# Patient Record
Sex: Female | Born: 1985
Health system: Southern US, Community
[De-identification: ages and names within clinical notes are randomized; demographics above are authoritative.]

## PROBLEM LIST (undated history)

## (undated) DIAGNOSIS — I471 Supraventricular tachycardia, unspecified: Secondary | ICD-10-CM

## (undated) DIAGNOSIS — Z8742 Personal history of other diseases of the female genital tract: Secondary | ICD-10-CM

## (undated) DIAGNOSIS — IMO0001 Reserved for inherently not codable concepts without codable children: Secondary | ICD-10-CM

## (undated) DIAGNOSIS — J45909 Unspecified asthma, uncomplicated: Secondary | ICD-10-CM

## (undated) DIAGNOSIS — M1990 Unspecified osteoarthritis, unspecified site: Secondary | ICD-10-CM

## (undated) DIAGNOSIS — D259 Leiomyoma of uterus, unspecified: Secondary | ICD-10-CM

## (undated) DIAGNOSIS — R0600 Dyspnea, unspecified: Secondary | ICD-10-CM

## (undated) DIAGNOSIS — T783XXA Angioneurotic edema, initial encounter: Secondary | ICD-10-CM

## (undated) DIAGNOSIS — I10 Essential (primary) hypertension: Secondary | ICD-10-CM

## (undated) DIAGNOSIS — N920 Excessive and frequent menstruation with regular cycle: Secondary | ICD-10-CM

## (undated) DIAGNOSIS — G4730 Sleep apnea, unspecified: Secondary | ICD-10-CM

## (undated) DIAGNOSIS — F419 Anxiety disorder, unspecified: Secondary | ICD-10-CM

## (undated) DIAGNOSIS — I499 Cardiac arrhythmia, unspecified: Secondary | ICD-10-CM

## (undated) DIAGNOSIS — E119 Type 2 diabetes mellitus without complications: Secondary | ICD-10-CM

## (undated) DIAGNOSIS — Z975 Presence of (intrauterine) contraceptive device: Secondary | ICD-10-CM

## (undated) HISTORY — PX: TYMPANOSTOMY TUBE PLACEMENT: SHX32

## (undated) HISTORY — DX: Angioneurotic edema, initial encounter: T78.3XXA

## (undated) HISTORY — DX: Presence of (intrauterine) contraceptive device: Z97.5

## (undated) HISTORY — DX: Personal history of other diseases of the female genital tract: Z87.42

## (undated) HISTORY — DX: Excessive and frequent menstruation with regular cycle: N92.0

## (undated) HISTORY — DX: Unspecified asthma, uncomplicated: J45.909

## (undated) HISTORY — PX: TONSILLECTOMY: SUR1361

---
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
## 2007-05-24 ENCOUNTER — Other Ambulatory Visit: Admission: RE | Admit: 2007-05-24 | Discharge: 2007-05-24 | Payer: Self-pay | Admitting: Obstetrics and Gynecology

## 2007-06-02 ENCOUNTER — Emergency Department (HOSPITAL_COMMUNITY): Admission: EM | Admit: 2007-06-02 | Discharge: 2007-06-02 | Payer: Self-pay | Admitting: Emergency Medicine

## 2007-09-17 ENCOUNTER — Emergency Department (HOSPITAL_COMMUNITY): Admission: EM | Admit: 2007-09-17 | Discharge: 2007-09-17 | Payer: Self-pay | Admitting: Emergency Medicine

## 2010-09-13 ENCOUNTER — Emergency Department (HOSPITAL_COMMUNITY)
Admission: EM | Admit: 2010-09-13 | Discharge: 2010-09-13 | Disposition: A | Discharge status: Home / Self Care | Payer: Self-pay | Attending: Emergency Medicine | Admitting: Emergency Medicine

## 2010-09-13 ENCOUNTER — Emergency Department (HOSPITAL_COMMUNITY): Payer: Self-pay

## 2010-09-13 DIAGNOSIS — W108XXA Fall (on) (from) other stairs and steps, initial encounter: Secondary | ICD-10-CM | POA: Insufficient documentation

## 2010-09-13 DIAGNOSIS — Y92009 Unspecified place in unspecified non-institutional (private) residence as the place of occurrence of the external cause: Secondary | ICD-10-CM | POA: Insufficient documentation

## 2010-09-13 DIAGNOSIS — S93409A Sprain of unspecified ligament of unspecified ankle, initial encounter: Secondary | ICD-10-CM | POA: Insufficient documentation

## 2010-09-13 DIAGNOSIS — M25579 Pain in unspecified ankle and joints of unspecified foot: Secondary | ICD-10-CM | POA: Insufficient documentation

## 2010-09-13 LAB — GLUCOSE, CAPILLARY

## 2010-11-06 ENCOUNTER — Emergency Department (HOSPITAL_COMMUNITY): Payer: Self-pay

## 2010-11-06 ENCOUNTER — Emergency Department (HOSPITAL_COMMUNITY)
Admission: EM | Admit: 2010-11-06 | Discharge: 2010-11-06 | Disposition: A | Discharge status: Home / Self Care | Payer: Self-pay | Attending: Emergency Medicine | Admitting: Emergency Medicine

## 2010-11-06 DIAGNOSIS — R109 Unspecified abdominal pain: Secondary | ICD-10-CM | POA: Insufficient documentation

## 2010-11-06 DIAGNOSIS — D259 Leiomyoma of uterus, unspecified: Secondary | ICD-10-CM | POA: Insufficient documentation

## 2010-11-06 LAB — URINALYSIS, ROUTINE W REFLEX MICROSCOPIC
Glucose, UA: NEGATIVE mg/dL
Protein, ur: NEGATIVE mg/dL
Specific Gravity, Urine: 1.025 (ref 1.005–1.030)

## 2010-11-06 LAB — URINE MICROSCOPIC-ADD ON

## 2010-11-13 ENCOUNTER — Emergency Department (HOSPITAL_COMMUNITY)
Admission: EM | Admit: 2010-11-13 | Discharge: 2010-11-14 | Disposition: A | Discharge status: Home / Self Care | Payer: Self-pay | Attending: Emergency Medicine | Admitting: Emergency Medicine

## 2010-11-13 ENCOUNTER — Encounter: Payer: Self-pay | Admitting: Emergency Medicine

## 2010-11-13 DIAGNOSIS — D259 Leiomyoma of uterus, unspecified: Secondary | ICD-10-CM | POA: Insufficient documentation

## 2010-11-13 DIAGNOSIS — D219 Benign neoplasm of connective and other soft tissue, unspecified: Secondary | ICD-10-CM

## 2010-11-13 DIAGNOSIS — R1084 Generalized abdominal pain: Secondary | ICD-10-CM | POA: Insufficient documentation

## 2010-11-13 HISTORY — DX: Leiomyoma of uterus, unspecified: D25.9

## 2010-11-13 LAB — URINALYSIS, ROUTINE W REFLEX MICROSCOPIC
Bilirubin Urine: NEGATIVE
Glucose, UA: NEGATIVE mg/dL
pH: 7 (ref 5.0–8.0)

## 2010-11-13 LAB — CBC
HCT: 36.3 % (ref 36.0–46.0)
MCV: 84 fL (ref 78.0–100.0)
RBC: 4.32 MIL/uL (ref 3.87–5.11)
WBC: 6.6 10*3/uL (ref 4.0–10.5)

## 2010-11-13 LAB — URINE MICROSCOPIC-ADD ON

## 2010-11-13 LAB — BASIC METABOLIC PANEL
BUN: 12 mg/dL (ref 6–23)
CO2: 25 mEq/L (ref 19–32)
Chloride: 106 mEq/L (ref 96–112)
Creatinine, Ser: 0.53 mg/dL (ref 0.50–1.10)
Glucose, Bld: 111 mg/dL — ABNORMAL HIGH (ref 70–99)

## 2010-11-13 MED ORDER — ONDANSETRON HCL 4 MG/2ML IJ SOLN: 4.0000 mg | Freq: Once | INTRAMUSCULAR | Status: DC

## 2010-11-13 MED ORDER — HYDROMORPHONE HCL 1 MG/ML IJ SOLN
1.0000 mg | Freq: Once | INTRAMUSCULAR | Status: AC
  Administered 2010-11-13: 1 mg via INTRAMUSCULAR
  Filled 2010-11-13 (×2): qty 1

## 2010-11-13 NOTE — ED Provider Notes (Cosign Needed)
History     Chief Complaint  Patient presents with  . Abdominal Pain   HPI Comments: She was dxd with fibroids 1 week ago.  She was seen by an ob/gyn and started on meds to reduce the size of the fibroids. She has not gotten relief so she came to the ed.  Patient is a 25 y.o. female presenting with abdominal pain. The history is provided by the patient.  Abdominal Pain The primary symptoms of the illness include abdominal pain. The primary symptoms of the illness do not include fever, shortness of breath, nausea, vomiting, diarrhea, hematemesis, dysuria, vaginal discharge or vaginal bleeding. Episode onset: one week ago. The onset of the illness was gradual. The problem has been gradually worsening.  The patient states that she believes she is currently not pregnant. Additional symptoms associated with the illness include urgency. Symptoms associated with the illness do not include chills, anorexia, heartburn, constipation, hematuria, frequency or back pain. Significant associated medical issues do not include PUD, GERD, inflammatory bowel disease, diabetes, gallstones, liver disease or diverticulitis.    Past Medical History  Diagnosis Date  . Fibroid, uterine     History reviewed. No pertinent past surgical history.  History reviewed. No pertinent family history.  History  Substance Use Topics  . Smoking status: Never Smoker   . Smokeless tobacco: Not on file  . Alcohol Use: No    OB History    Grav Para Term Preterm Abortions TAB SAB Ect Mult Living                  Review of Systems  Constitutional: Negative for fever, chills and appetite change.  HENT: Negative for facial swelling.   Eyes: Negative for redness.  Respiratory: Negative for cough, chest tightness and shortness of breath.   Cardiovascular: Negative for chest pain.  Gastrointestinal: Positive for abdominal pain. Negative for heartburn, nausea, vomiting, diarrhea, constipation, abdominal distention, anorexia  and hematemesis.  Genitourinary: Positive for urgency. Negative for dysuria, frequency, hematuria, vaginal bleeding, vaginal discharge and difficulty urinating.  Musculoskeletal: Negative for back pain.  Skin: Negative for color change.  Neurological: Negative for dizziness and headaches.  Psychiatric/Behavioral: Negative for confusion.    Physical Exam  BP 113/67  Pulse 78  Temp(Src) 98.2 F (36.8 C) (Oral)  Resp 20  Ht 5\' 5"  (1.651 m)  Wt 212 lb (96.163 kg)  BMI 35.28 kg/m2  SpO2 100%  LMP 10/22/2010  Physical Exam  Constitutional: She is oriented to person, place, and time. She appears well-developed and well-nourished. No distress.  HENT:  Head: Normocephalic and atraumatic.  Eyes: Pupils are equal, round, and reactive to light.  Neck: Normal range of motion. Neck supple.  Cardiovascular: Normal heart sounds.   Pulmonary/Chest: Effort normal.  Abdominal: Soft. She exhibits no distension. There is tenderness in the left lower quadrant. There is no rigidity, no rebound, no guarding and no tenderness at McBurney's point.       Firm mass with mild ttp in llq  Musculoskeletal: Normal range of motion.  Neurological: She is alert and oriented to person, place, and time.  Skin: Skin is warm and dry. She is not diaphoretic.  Psychiatric: She has a normal mood and affect.    ED Course  Procedures  MDM   Abdominal pain with known fibroids. Taking med to reduce size of fibroids. Not on analgesics previously for pain.  Pain controled after tx in ed.    No acute abdomen. Not pregnant. No uti.  Not anemic or toxic.        Nicholes Stairs, MD 11/14/10 6962  Nicholes Stairs, MD 11/14/10 0111

## 2010-11-13 NOTE — ED Notes (Signed)
Patient was seen here 1 week, diagnosed with fibroid tumors; states saw GYN MD on Tuesday and was given medication to shrink tumors. States pain has gotten worse.

## 2010-11-14 MED ORDER — OXYCODONE-ACETAMINOPHEN 5-325 MG PO TABS: 2.0000 | ORAL_TABLET | ORAL | Status: DC | PRN

## 2010-11-24 ENCOUNTER — Other Ambulatory Visit: Payer: Self-pay | Admitting: Obstetrics & Gynecology

## 2010-11-25 ENCOUNTER — Encounter (HOSPITAL_COMMUNITY): Payer: Self-pay

## 2010-11-25 ENCOUNTER — Encounter (HOSPITAL_COMMUNITY)
Admission: RE | Admit: 2010-11-25 | Discharge: 2010-11-25 | Disposition: A | Discharge status: Home / Self Care | Payer: Self-pay | Source: Ambulatory Visit | Attending: Obstetrics & Gynecology | Admitting: Obstetrics & Gynecology

## 2010-11-25 DIAGNOSIS — Z01812 Encounter for preprocedural laboratory examination: Secondary | ICD-10-CM | POA: Insufficient documentation

## 2010-11-25 DIAGNOSIS — Z5309 Procedure and treatment not carried out because of other contraindication: Secondary | ICD-10-CM | POA: Insufficient documentation

## 2010-11-25 DIAGNOSIS — D259 Leiomyoma of uterus, unspecified: Secondary | ICD-10-CM | POA: Insufficient documentation

## 2010-11-25 LAB — COMPREHENSIVE METABOLIC PANEL
Albumin: 3.6 g/dL (ref 3.5–5.2)
BUN: 8 mg/dL (ref 6–23)
Calcium: 9.3 mg/dL (ref 8.4–10.5)
Creatinine, Ser: 0.56 mg/dL (ref 0.50–1.10)
Potassium: 3.6 mEq/L (ref 3.5–5.1)
Total Protein: 6.9 g/dL (ref 6.0–8.3)

## 2010-11-25 LAB — HCG, QUANTITATIVE, PREGNANCY: hCG, Beta Chain, Quant, S: 1530 m[IU]/mL — ABNORMAL HIGH (ref <5)

## 2010-11-25 LAB — SURGICAL PCR SCREEN: Staphylococcus aureus: POSITIVE — AB

## 2010-11-25 LAB — URINALYSIS, ROUTINE W REFLEX MICROSCOPIC
Glucose, UA: NEGATIVE mg/dL
Leukocytes, UA: NEGATIVE
Specific Gravity, Urine: >1.03 — ABNORMAL HIGH (ref 1.005–1.030)
pH: 5.5 (ref 5.0–8.0)

## 2010-11-25 LAB — CBC
HCT: 34.9 % — ABNORMAL LOW (ref 36.0–46.0)
MCHC: 33 g/dL (ref 30.0–36.0)
MCV: 84.5 fL (ref 78.0–100.0)
RDW: 14.5 % (ref 11.5–15.5)

## 2010-11-25 MED ORDER — MUPIROCIN 2 % EX OINT
TOPICAL_OINTMENT | CUTANEOUS | Status: AC
  Filled 2010-11-25: qty 22

## 2010-11-25 NOTE — Patient Instructions (Addendum)
20 Jessica Sanchez  11/25/2010   Your procedure is scheduled on:  12/02/2010  Report to [facility] at  615  AM.  Call this number if you have problems the morning of surgery: [phone]   Remember:   Do not eat food:After Midnight.  Do not drink clear liquids: After Midnight.  Take these medicines the morning of surgery with A SIP OF WATER: none   Do not wear jewelry, make-up or nail polish.  Do not bring valuables to the hospital.  Contacts, dentures or bridgework may not be worn into surgery.  Leave suitcase in the car. After surgery it may be brought to your room.  For patients admitted to the hospital, checkout time is 11:00 AM the day of discharge.   Patients discharged the day of surgery will not be allowed to drive home.  Name and phone number of your driver: family  Special Instructions: CHG Shower Shower 2 days before surgery and 1 day before surgery with Hibiclens.   Please read over the following fact sheets that you were given: Pain Booklet, MRSA Information, Surgical Site Infection Prevention and Anesthesia Post-op Instructions PATIENT INSTRUCTIONS POST-ANESTHESIA  IMMEDIATELY FOLLOWING SURGERY:  Do not drive or operate machinery for the first twenty four hours after surgery.  Do not make any important decisions for twenty four hours after surgery or while taking narcotic pain medications or sedatives.  If you develop intractable nausea and vomiting or a severe headache please notify your doctor immediately.  FOLLOW-UP:  Please make an appointment with your surgeon as instructed. You do not need to follow up with anesthesia unless specifically instructed to do so.  WOUND CARE INSTRUCTIONS (if applicable):  Keep a dry clean dressing on the anesthesia/puncture wound site if there is drainage.  Once the wound has quit draining you may leave it open to air.  Generally you should leave the bandage intact for twenty four hours unless there is drainage.  If the epidural site drains for  more than 36-48 hours please call the anesthesia department.  QUESTIONS?:  Please feel free to call your physician or the hospital operator if you have any questions, and they will be happy to assist you.     [facility] Anesthesia Department [address] [place] [phone]

## 2010-11-26 NOTE — Pre-Procedure Instructions (Signed)
Beta hcg 1530.Called to Dr Despina Hidden.Surgery cancelled.He will call patient.[name] or scheduler called and notified.

## 2010-11-28 LAB — TYPE AND SCREEN: ABO/RH(D): A POS

## 2010-12-02 ENCOUNTER — Encounter (HOSPITAL_COMMUNITY): Admission: RE | Payer: Self-pay | Source: Ambulatory Visit

## 2010-12-02 ENCOUNTER — Ambulatory Visit (HOSPITAL_COMMUNITY): Admission: RE | Admit: 2010-12-02 | Payer: Self-pay | Source: Ambulatory Visit | Admitting: Obstetrics & Gynecology

## 2010-12-02 SURGERY — MYOMECTOMY, ABDOMINAL APPROACH
Anesthesia: General

## 2010-12-05 ENCOUNTER — Emergency Department (HOSPITAL_COMMUNITY)
Admission: EM | Admit: 2010-12-05 | Discharge: 2010-12-05 | Disposition: A | Discharge status: Home / Self Care | Payer: Medicaid Other | Attending: Emergency Medicine | Admitting: Emergency Medicine

## 2010-12-05 ENCOUNTER — Encounter (HOSPITAL_COMMUNITY): Payer: Self-pay

## 2010-12-05 DIAGNOSIS — D259 Leiomyoma of uterus, unspecified: Secondary | ICD-10-CM | POA: Insufficient documentation

## 2010-12-05 DIAGNOSIS — O4690 Antepartum hemorrhage, unspecified, unspecified trimester: Secondary | ICD-10-CM

## 2010-12-05 DIAGNOSIS — O209 Hemorrhage in early pregnancy, unspecified: Secondary | ICD-10-CM | POA: Insufficient documentation

## 2010-12-05 HISTORY — DX: Reserved for inherently not codable concepts without codable children: IMO0001

## 2010-12-05 MED ORDER — ACETAMINOPHEN 325 MG PO TABS
650.0000 mg | ORAL_TABLET | Freq: Once | ORAL | Status: AC
  Administered 2010-12-05: 650 mg via ORAL
  Filled 2010-12-05: qty 1

## 2010-12-05 NOTE — ED Notes (Signed)
In and out cath preformed.  No bleeding noted,.

## 2010-12-05 NOTE — ED Notes (Signed)
Known fibroids, sched for surgery on 8/1. Began spotting and had an office sono done which revealed 6week gest pregnancy.  Now bleeding one pad/hour, no clotting

## 2010-12-06 ENCOUNTER — Emergency Department (HOSPITAL_COMMUNITY)
Admission: EM | Admit: 2010-12-06 | Discharge: 2010-12-07 | Disposition: A | Discharge status: Home / Self Care | Payer: Medicaid Other | Attending: Emergency Medicine | Admitting: Emergency Medicine

## 2010-12-06 ENCOUNTER — Encounter (HOSPITAL_COMMUNITY): Payer: Self-pay | Admitting: *Deleted

## 2010-12-06 DIAGNOSIS — R55 Syncope and collapse: Secondary | ICD-10-CM

## 2010-12-06 DIAGNOSIS — O99891 Other specified diseases and conditions complicating pregnancy: Secondary | ICD-10-CM | POA: Insufficient documentation

## 2010-12-06 NOTE — ED Notes (Signed)
Pt arrived pov, AMS initially in vehicle, pt moved to stretcher and transported to room 1, pt reports she is approx 6 weeks preg based on due date, attempted FHT without success

## 2010-12-06 NOTE — ED Notes (Signed)
Patient remembers telling a co-worker that she was light headed and that was the last thing she remembers, reported that pt passed out, pt states that she has eaten today (a sub), pt alert and oriented at this time

## 2010-12-07 ENCOUNTER — Other Ambulatory Visit: Payer: Self-pay

## 2010-12-07 MED ORDER — SODIUM CHLORIDE 0.9 % IV BOLUS (SEPSIS)
1000.0000 mL | Freq: Once | INTRAVENOUS | Status: AC
  Administered 2010-12-07: 1000 mL via INTRAVENOUS

## 2010-12-07 MED ORDER — SODIUM CHLORIDE 0.9 % IV SOLN: Freq: Once | INTRAVENOUS | Status: DC

## 2010-12-07 NOTE — ED Provider Notes (Addendum)
History     CSN: [id number] Arrival date & time: 12/06/2010 11:33 PM  Chief Complaint  Patient presents with  . Loss of Consciousness   HPI Comments: Seen 1158  Patient is [redacted] weeks pregnant. LMP 10/22/2010. Under the care of OB Dr. Despina Hidden. G1PoAo  Patient is a 25 y.o. female presenting with syncope. The history is provided by the patient.  Loss of Consciousness Chronicity: Patient was leaving work and felt dizzy, had syncopal episode while going to the car. Friend caught her as she was faiting and lowered her to the ground. She woke immediattely. Brought to the hospital. The current episode started less than 1 hour ago. The problem has been resolved. Pertinent negatives include no chest pain, no abdominal pain, no headaches and no shortness of breath. Associated symptoms comments: Felt a little dizzy. The symptoms are aggravated by nothing. The symptoms are relieved by nothing. She has tried nothing for the symptoms.    Past Medical History  Diagnosis Date  . Fibroid, uterine   . Fibroid (bleeding) (uterine)     Past Surgical History  Procedure Date  . Tonsillectomy     Family History  Problem Relation Age of Onset  . Anesthesia problems Neg Hx   . Hypotension Neg Hx   . Malignant hyperthermia Neg Hx   . Pseudochol deficiency Neg Hx     History  Substance Use Topics  . Smoking status: Never Smoker   . Smokeless tobacco: Not on file  . Alcohol Use: No    OB History    Grav Para Term Preterm Abortions TAB SAB Ect Mult Living   1               Review of Systems  Respiratory: Negative for shortness of breath.   Cardiovascular: Positive for syncope. Negative for chest pain.  Gastrointestinal: Negative for abdominal pain.  Neurological: Negative for headaches.  All other systems reviewed and are negative.    Physical Exam  LMP 10/23/2010  Physical Exam  Nursing note and vitals reviewed. Constitutional: She is oriented to person, place, and time. She appears  well-developed and well-nourished.  HENT:  Head: Atraumatic.  Right Ear: External ear normal.  Left Ear: External ear normal.  Mouth/Throat: Oropharynx is clear and moist.  Eyes: EOM are normal. Pupils are equal, round, and reactive to light.  Neck: Normal range of motion. Neck supple.  Cardiovascular: Normal rate, normal heart sounds and intact distal pulses.   Pulmonary/Chest: Effort normal and breath sounds normal.  Abdominal: Soft. Bowel sounds are normal.  Musculoskeletal: Normal range of motion.  Neurological: She is alert and oriented to person, place, and time.  Skin: Skin is warm and dry.    ED Course  Procedures  MDM  Patient had orthostatics done which were nomal.She has had IVF and PO fluids. She has had no dizziness. Ambulated in the department and has gone to the bathroom. No symptoms.   Nicoletta Dress. Colon Branch, MD 12/07/10 9629  Date: 12/07/2010  5284  [id number]  Rhythm: normal sinus rhythm  QRS Axis: normal  Intervals: normal  ST/T Wave abnormalities: normal  Conduction Disutrbances:none  Narrative Interpretation: T wave abnormality with inversion in anterior leads.   Old EKG Reviewed: none available   Nicoletta Dress. Colon Branch, MD 12/07/10 [phone]

## 2010-12-09 NOTE — ED Provider Notes (Signed)
History     CSN: [id number] Arrival date & time: 12/05/2010  2:04 AM  Chief Complaint  Patient presents with  . Vaginal Bleeding   HPI  Past Medical History  Diagnosis Date  . Fibroid, uterine   . Fibroid (bleeding) (uterine)     Past Surgical History  Procedure Date  . Tonsillectomy     Family History  Problem Relation Age of Onset  . Anesthesia problems Neg Hx   . Hypotension Neg Hx   . Malignant hyperthermia Neg Hx   . Pseudochol deficiency Neg Hx     History  Substance Use Topics  . Smoking status: Never Smoker   . Smokeless tobacco: Not on file  . Alcohol Use: No    OB History    Grav Para Term Preterm Abortions TAB SAB Ect Mult Living   1               Review of Systems  Physical Exam  BP 123/71  Pulse 81  Temp(Src) 98.6 F (37 C) (Oral)  Resp 16  Ht 5\' 4"  (1.626 m)  Wt 215 lb (97.523 kg)  BMI 36.90 kg/m2  SpO2 100%  LMP 10/23/2010  Physical Exam  ED Course  Procedures  MDM       Aurther Loft S. Colon Branch, MD 12/09/10 1159

## 2010-12-10 LAB — GLUCOSE, CAPILLARY: Glucose-Capillary: 93 mg/dL (ref 70–99)

## 2010-12-11 ENCOUNTER — Emergency Department (HOSPITAL_COMMUNITY)
Admission: EM | Admit: 2010-12-11 | Discharge: 2010-12-11 | Disposition: A | Discharge status: Home / Self Care | Payer: Medicaid Other | Attending: Emergency Medicine | Admitting: Emergency Medicine

## 2010-12-11 ENCOUNTER — Encounter (HOSPITAL_COMMUNITY): Payer: Self-pay | Admitting: Emergency Medicine

## 2010-12-11 DIAGNOSIS — O200 Threatened abortion: Secondary | ICD-10-CM

## 2010-12-11 LAB — HEMOGLOBIN AND HEMATOCRIT, BLOOD: HCT: 35.8 % — ABNORMAL LOW (ref 36.0–46.0)

## 2010-12-11 NOTE — ED Notes (Signed)
Patient states she is pregnant and has gone through 10 pads today and is now passing clots.

## 2010-12-12 ENCOUNTER — Encounter (HOSPITAL_COMMUNITY): Payer: Self-pay | Admitting: *Deleted

## 2010-12-19 NOTE — ED Provider Notes (Signed)
History     CSN: [id number] Arrival date & time: 12/11/2010  7:01 PM  Chief Complaint  Patient presents with  . Vaginal Bleeding   HPI Comments: Patient presents with vaginal bleeding with a known 6 week pregnancy. She was seen by Dr. Despina Hidden 7days ago at which time a intrauterine pregnancy was established.  She started spotting over the weekend and returned to his office 4 days ago at which time a beta hcg was completed,  The number being around 7800 per grandmothers report.  This test was repeated yesterday and had not changed,  At which time she was told she was probably having a spontaneous miscarriage.  She presents today for increased bleeding and mild pelvic cramping.  She passed a piece of "tissue" today as well.  She denies weakness,  Dizziness,  Shortness of breath.  Patient is a 25 y.o. female presenting with vaginal bleeding. The history is provided by the patient (grandmother).  Vaginal Bleeding This is a new problem. The current episode started yesterday. The problem has been gradually worsening. Pertinent negatives include no abdominal pain, arthralgias, chest pain, congestion, fatigue, fever, headaches, joint swelling, nausea, neck pain, numbness, rash, sore throat or weakness. Associated symptoms comments: Describes occasional low pelvic cramping.. The symptoms are aggravated by nothing. She has tried nothing for the symptoms.    Past Medical History  Diagnosis Date  . Fibroid, uterine   . Fibroid (bleeding) (uterine)     Past Surgical History  Procedure Date  . Tonsillectomy     Family History  Problem Relation Age of Onset  . Anesthesia problems Neg Hx   . Hypotension Neg Hx   . Malignant hyperthermia Neg Hx   . Pseudochol deficiency Neg Hx     History  Substance Use Topics  . Smoking status: Never Smoker   . Smokeless tobacco: Not on file  . Alcohol Use: No    OB History    Grav Para Term Preterm Abortions TAB SAB Ect Mult Living   1                Review of Systems  Constitutional: Negative for fever and fatigue.  HENT: Negative for congestion, sore throat and neck pain.   Eyes: Negative.   Respiratory: Negative for chest tightness and shortness of breath.   Cardiovascular: Negative for chest pain.  Gastrointestinal: Negative for nausea and abdominal pain.  Genitourinary: Positive for vaginal bleeding. Negative for dysuria and vaginal discharge.  Musculoskeletal: Negative for joint swelling and arthralgias.  Skin: Negative.  Negative for rash and wound.  Neurological: Negative for dizziness, weakness, light-headedness, numbness and headaches.  Hematological: Negative.   Psychiatric/Behavioral: Negative.     Physical Exam  BP 125/80  Pulse 94  Temp(Src) 98.2 F (36.8 C) (Oral)  Resp 20  Ht 5\' 5"  (1.651 m)  Wt 210 lb (95.255 kg)  BMI 34.95 kg/m2  SpO2 100%  LMP 10/23/2010  Physical Exam  Nursing note and vitals reviewed. Constitutional: She is oriented to person, place, and time. She appears well-developed and well-nourished.  HENT:  Head: Normocephalic and atraumatic.  Eyes: Conjunctivae are normal.  Neck: Normal range of motion.  Cardiovascular: Normal rate, regular rhythm, normal heart sounds and intact distal pulses.   Pulmonary/Chest: Effort normal and breath sounds normal. She has no wheezes.  Abdominal: Soft. Bowel sounds are normal. She exhibits no mass. There is no tenderness.  Musculoskeletal: Normal range of motion.  Neurological: She is alert and oriented to person, place, and  time.  Skin: Skin is warm and dry.  Psychiatric: She has a normal mood and affect.    ED Course  Procedures  MDM Discussed care with Dr. Oletta Lamas,  Patient stable and in no discomfort at this time.  Incomplete abortion.  Patient understands need to return or call Dr. Despina Hidden for weakness,  Dizziness,  Increased bleeding.  Hgb stable today.      Candis Musa, PA 12/19/10 [phone]

## 2010-12-19 NOTE — ED Provider Notes (Signed)
Evaluation and management procedures were performed by the mid-level provider (PA/NP/CNM) under my supervision/collaboration. I was present and available during the ED course. Ameenah Prosser Y.   Gavin Pound. Bettejane Leavens, MD 12/19/10 1459

## 2011-01-21 LAB — URINALYSIS, ROUTINE W REFLEX MICROSCOPIC
Nitrite: NEGATIVE
Protein, ur: NEGATIVE
Specific Gravity, Urine: >1.03 — ABNORMAL HIGH
Urobilinogen, UA: 0.2

## 2011-01-21 LAB — URINE CULTURE

## 2011-01-21 LAB — PREGNANCY, URINE: Preg Test, Ur: NEGATIVE

## 2011-01-21 LAB — URINE MICROSCOPIC-ADD ON

## 2011-01-26 ENCOUNTER — Other Ambulatory Visit: Payer: Self-pay | Admitting: Obstetrics & Gynecology

## 2011-01-27 ENCOUNTER — Encounter (HOSPITAL_COMMUNITY)
Admission: RE | Admit: 2011-01-27 | Discharge: 2011-01-27 | Disposition: A | Discharge status: Home / Self Care | Payer: Medicaid Other | Source: Ambulatory Visit | Attending: Obstetrics & Gynecology | Admitting: Obstetrics & Gynecology

## 2011-01-27 ENCOUNTER — Encounter (HOSPITAL_COMMUNITY): Payer: Self-pay

## 2011-01-27 ENCOUNTER — Other Ambulatory Visit: Payer: Self-pay | Admitting: Obstetrics & Gynecology

## 2011-01-27 LAB — URINE MICROSCOPIC-ADD ON

## 2011-01-27 LAB — CBC
HCT: 39.3 % (ref 36.0–46.0)
Hemoglobin: 12.9 g/dL (ref 12.0–15.0)
MCHC: 32.8 g/dL (ref 30.0–36.0)
MCV: 83.6 fL (ref 78.0–100.0)
RDW: 13.8 % (ref 11.5–15.5)
WBC: 4.2 10*3/uL (ref 4.0–10.5)

## 2011-01-27 LAB — URINALYSIS, ROUTINE W REFLEX MICROSCOPIC
Bilirubin Urine: NEGATIVE
Glucose, UA: NEGATIVE mg/dL
Ketones, ur: NEGATIVE mg/dL
pH: 5.5 (ref 5.0–8.0)

## 2011-01-27 LAB — COMPREHENSIVE METABOLIC PANEL
Albumin: 3.9 g/dL (ref 3.5–5.2)
Alkaline Phosphatase: 58 U/L (ref 39–117)
BUN: 8 mg/dL (ref 6–23)
Chloride: 105 mEq/L (ref 96–112)
Creatinine, Ser: 0.72 mg/dL (ref 0.50–1.10)
GFR calc Af Amer: >60 mL/min (ref >60)
Glucose, Bld: 93 mg/dL (ref 70–99)
Potassium: 4 mEq/L (ref 3.5–5.1)
Total Bilirubin: 0.5 mg/dL (ref 0.3–1.2)

## 2011-01-27 LAB — HCG, QUANTITATIVE, PREGNANCY: hCG, Beta Chain, Quant, S: 1 m[IU]/mL (ref <5)

## 2011-01-27 MED ORDER — CEFAZOLIN SODIUM 1-5 GM-% IV SOLN
1.0000 g | INTRAVENOUS | Status: DC
Start: 2011-01-27 — End: 2011-01-27

## 2011-01-27 MED ORDER — LACTATED RINGERS IV SOLN: INTRAVENOUS | Status: DC

## 2011-01-27 NOTE — Patient Instructions (Addendum)
20 Jessica Sanchez  01/27/2011   Your procedure is scheduled on:   02/03/2011  Report to [facility] at  615  AM.  Call this number if you have problems the morning of surgery: [phone]   Remember:   Do not eat food:After Midnight.  Do not drink clear liquids: After Midnight.  Take these medicines the morning of surgery with A SIP OF WATER: percocet   Do not wear jewelry, make-up or nail polish.  Do not wear lotions, powders, or perfumes. You may wear deodorant.  Do not shave 48 hours prior to surgery.  Do not bring valuables to the hospital.  Contacts, dentures or bridgework may not be worn into surgery.  Leave suitcase in the car. After surgery it may be brought to your room.  For patients admitted to the hospital, checkout time is 11:00 AM the day of discharge.   Patients discharged the day of surgery will not be allowed to drive home.  Name and phone number of your driver: family  Special Instructions: CHG Shower Use Special Wash: 1/2 bottle night before surgery and 1/2 bottle morning of surgery.   Please read over the following fact sheets that you were given: Pain Booklet, MRSA Information, Surgical Site Infection Prevention, Anesthesia Post-op Instructions and Care and Recovery After Surgery PATIENT INSTRUCTIONS POST-ANESTHESIA  IMMEDIATELY FOLLOWING SURGERY:  Do not drive or operate machinery for the first twenty four hours after surgery.  Do not make any important decisions for twenty four hours after surgery or while taking narcotic pain medications or sedatives.  If you develop intractable nausea and vomiting or a severe headache please notify your doctor immediately.  FOLLOW-UP:  Please make an appointment with your surgeon as instructed. You do not need to follow up with anesthesia unless specifically instructed to do so.  WOUND CARE INSTRUCTIONS (if applicable):  Keep a dry clean dressing on the anesthesia/puncture wound site if there is drainage.  Once the wound has quit  draining you may leave it open to air.  Generally you should leave the bandage intact for twenty four hours unless there is drainage.  If the epidural site drains for more than 36-48 hours please call the anesthesia department.  QUESTIONS?:  Please feel free to call your physician or the hospital operator if you have any questions, and they will be happy to assist you.     [facility] Anesthesia Department [address] [place] [phone]

## 2011-02-02 NOTE — H&P (Signed)
Jessica Sanchez is an 25 y.o. female. G1 P0 A1 on chronic megestrol therapy with enlarged fibroid uterus, multiple fibroids which are symptomatic, with abdominal pain, dysmenorrhea, and menometrorrhagia.  She was scheduled to have the surgery a couple of months ago but was pregnant, unknowlingly.  She eventually suffered a pregnancy loss in the first trimester.  She is admitted for abdominal myomectomy.   Past Medical History  Diagnosis Date  . Fibroid, uterine   . Fibroid (bleeding) (uterine)     Past Surgical History  Procedure Date  . Tonsillectomy     Family History  Problem Relation Age of Onset  . Anesthesia problems Neg Hx   . Hypotension Neg Hx   . Malignant hyperthermia Neg Hx   . Pseudochol deficiency Neg Hx     Social History:  reports that she has never smoked. She does not have any smokeless tobacco history on file. She reports that she does not drink alcohol or use illicit drugs.  Allergies: No Known Allergies    Review of Systems  Constitutional: Negative for fever, chills, weight loss, malaise/fatigue and diaphoresis.  HENT: Negative for hearing loss, ear pain, nosebleeds, congestion, sore throat, neck pain, tinnitus and ear discharge.   Eyes: Negative for blurred vision, double vision, photophobia, pain, discharge and redness.  Respiratory: Negative for cough, hemoptysis, sputum production, shortness of breath, wheezing and stridor.   Cardiovascular: Negative for chest pain, palpitations, orthopnea, claudication, leg swelling and PND.  Gastrointestinal: Positive for abdominal pain. Negative for heartburn, nausea, vomiting, diarrhea, constipation, blood in stool and melena.  Genitourinary: Negative for dysuria, urgency, frequency, hematuria and flank pain.  Musculoskeletal: Negative for myalgias, back pain, joint pain and falls.  Skin: Negative for itching and rash.  Neurological: Negative for dizziness, tingling, tremors, sensory change, speech change, focal  weakness, seizures, loss of consciousness, weakness and headaches.  Endo/Heme/Allergies: Negative for environmental allergies and polydipsia. Does not bruise/bleed easily.  Psychiatric/Behavioral: Negative for depression, suicidal ideas, hallucinations, memory loss and substance abuse. The patient is not nervous/anxious and does not have insomnia.     Last menstrual period 10/23/2010, unknown if currently breastfeeding. Physical Exam  Vitals reviewed. Constitutional: She is oriented to person, place, and time. She appears well-developed and well-nourished.  HENT:  Head: Normocephalic and atraumatic.  Right Ear: External ear normal.  Left Ear: External ear normal.  Nose: Nose normal.  Mouth/Throat: Oropharynx is clear and moist.  Eyes: Conjunctivae and EOM are normal. Pupils are equal, round, and reactive to light. Right eye exhibits no discharge. Left eye exhibits no discharge. No scleral icterus.  Neck: Normal range of motion. Neck supple. No tracheal deviation present. No thyromegaly present.  Cardiovascular: Normal rate, regular rhythm, normal heart sounds and intact distal pulses.  Exam reveals no gallop and no friction rub.   No murmur heard. Respiratory: Effort normal and breath sounds normal. No respiratory distress. She has no wheezes. She has no rales. She exhibits no tenderness.  GI: Soft. Bowel sounds are normal. She exhibits no distension and no mass. There is tenderness. There is no rebound and no guarding.  Genitourinary:       Vulva is normal without lesions Vagina is pink moist without discharge Cervix normal in appearance and pap is normal Uterus is enlarged to 16-18 weeks size due to multiple fibroids the largest of which is 6.1 cm Adnexa is negative with normal sized ovaries by sonogram  Musculoskeletal: Normal range of motion. She exhibits no edema and no tenderness.  Neurological:  She is alert and oriented to person, place, and time. She has normal reflexes. She  displays normal reflexes. No cranial nerve deficit. She exhibits normal muscle tone. Coordination normal.  Skin: Skin is warm and dry. No rash noted. No erythema. No pallor.  Psychiatric: She has a normal mood and affect. Her behavior is normal. Judgment and thought content normal.    Recent Results (from the past 336 hour(s))  URINALYSIS, ROUTINE W REFLEX MICROSCOPIC   Collection Time   01/27/11  8:58 AM      Component Value Range   Color, Urine YELLOW  YELLOW    Appearance CLEAR  CLEAR    Specific Gravity, Urine >1.030 (*) 1.005 - 1.030    pH 5.5  5.0 - 8.0    Glucose, UA NEGATIVE  NEGATIVE (mg/dL)   Hgb urine dipstick LARGE (*) NEGATIVE    Bilirubin Urine NEGATIVE  NEGATIVE    Ketones, ur NEGATIVE  NEGATIVE (mg/dL)   Protein, ur NEGATIVE  NEGATIVE (mg/dL)   Urobilinogen, UA 0.2  0.0 - 1.0 (mg/dL)   Nitrite NEGATIVE  NEGATIVE    Leukocytes, UA NEGATIVE  NEGATIVE   URINE MICROSCOPIC-ADD ON   Collection Time   01/27/11  8:58 AM      Component Value Range   Squamous Epithelial / LPF FEW (*) RARE    WBC, UA 0-2  <3 (WBC/hpf)   RBC / HPF 3-6  <3 (RBC/hpf)   Bacteria, UA FEW (*) RARE   SURGICAL PCR SCREEN   Collection Time   01/27/11  9:10 AM      Component Value Range   MRSA, PCR NEGATIVE  NEGATIVE    Staphylococcus aureus POSITIVE (*) NEGATIVE   CBC   Collection Time   01/27/11  9:30 AM      Component Value Range   WBC 4.2  4.0 - 10.5 (K/uL)   RBC 4.70  3.87 - 5.11 (MIL/uL)   Hemoglobin 12.9  12.0 - 15.0 (g/dL)   HCT 45.4  09.8 - 11.9 (%)   MCV 83.6  78.0 - 100.0 (fL)   MCH 27.4  26.0 - 34.0 (pg)   MCHC 32.8  30.0 - 36.0 (g/dL)   RDW 14.7  82.9 - 56.2 (%)   Platelets 272  150 - 400 (K/uL)  COMPREHENSIVE METABOLIC PANEL   Collection Time   01/27/11  9:30 AM      Component Value Range   Sodium 136  135 - 145 (mEq/L)   Potassium 4.0  3.5 - 5.1 (mEq/L)   Chloride 105  96 - 112 (mEq/L)   CO2 22  19 - 32 (mEq/L)   Glucose, Bld 93  70 - 99 (mg/dL)   BUN 8  6 - 23 (mg/dL)     Creatinine, Ser 1.30  0.50 - 1.10 (mg/dL)   Calcium 9.1  8.4 - 86.5 (mg/dL)   Total Protein 6.7  6.0 - 8.3 (g/dL)   Albumin 3.9  3.5 - 5.2 (g/dL)   AST 10  0 - 37 (U/L)   ALT 10  0 - 35 (U/L)   Alkaline Phosphatase 58  39 - 117 (U/L)   Total Bilirubin 0.5  0.3 - 1.2 (mg/dL)   GFR calc non Af Amer >60  >60 (mL/min)   GFR calc Af Amer >60  >60 (mL/min)  HCG, QUANTITATIVE, PREGNANCY   Collection Time   01/27/11  9:30 AM      Component Value Range   hCG, Beta Chain, Quant, S 1  <  5 (mIU/mL)  TYPE AND SCREEN   Collection Time   01/27/11  9:30 AM      Component Value Range   ABO/RH(D) A POS     Antibody Screen NEG     Sample Expiration 01/30/2011         Assessment/Plan: 1.  Enlarged fibroid uterus, 16-18 weeks size 2.  Abdominal pain 3.  Dysmenorrhea 4.  Menometrorrhagia 5.  First trimester pregnancy loss   Abdominal myomectomy.  Pt understands the risks of surgery including but not limited t  excessive bleeding requiring transfusion or reoperation, post-operative infection requiring prolonged hospitalization or re-hospitalization and antibiotic therapy, and damage to other organs including bladder, bowel, ureters and major vessels.  She understands the possibility of needing a hysterectomy.  The patient also understands the alternative treatment options which were discussed in full.  All questions were answered.  Jedadiah Abdallah H 02/02/2011, 9:16 PM

## 2011-02-03 ENCOUNTER — Ambulatory Visit (HOSPITAL_COMMUNITY)
Admission: RE | Admit: 2011-02-03 | Discharge: 2011-02-05 | Disposition: A | Discharge status: Home / Self Care | Payer: Medicaid Other | Source: Ambulatory Visit | Attending: Obstetrics & Gynecology | Admitting: Obstetrics & Gynecology

## 2011-02-03 ENCOUNTER — Ambulatory Visit (HOSPITAL_COMMUNITY): Payer: Medicaid Other | Admitting: Anesthesiology

## 2011-02-03 ENCOUNTER — Encounter (HOSPITAL_COMMUNITY): Payer: Self-pay | Admitting: Anesthesiology

## 2011-02-03 ENCOUNTER — Encounter (HOSPITAL_COMMUNITY)
Admission: RE | Disposition: A | Discharge status: Home / Self Care | Payer: Self-pay | Source: Ambulatory Visit | Attending: Obstetrics & Gynecology

## 2011-02-03 ENCOUNTER — Other Ambulatory Visit: Payer: Self-pay | Admitting: Obstetrics & Gynecology

## 2011-02-03 ENCOUNTER — Encounter (HOSPITAL_COMMUNITY): Payer: Self-pay | Admitting: *Deleted

## 2011-02-03 DIAGNOSIS — Z8742 Personal history of other diseases of the female genital tract: Secondary | ICD-10-CM | POA: Insufficient documentation

## 2011-02-03 DIAGNOSIS — Z9889 Other specified postprocedural states: Secondary | ICD-10-CM

## 2011-02-03 DIAGNOSIS — N920 Excessive and frequent menstruation with regular cycle: Secondary | ICD-10-CM | POA: Insufficient documentation

## 2011-02-03 DIAGNOSIS — N946 Dysmenorrhea, unspecified: Secondary | ICD-10-CM | POA: Insufficient documentation

## 2011-02-03 DIAGNOSIS — R109 Unspecified abdominal pain: Secondary | ICD-10-CM | POA: Insufficient documentation

## 2011-02-03 DIAGNOSIS — D259 Leiomyoma of uterus, unspecified: Secondary | ICD-10-CM | POA: Insufficient documentation

## 2011-02-03 HISTORY — PX: MYOMECTOMY: SHX85

## 2011-02-03 SURGERY — MYOMECTOMY, ABDOMINAL APPROACH
Anesthesia: General | Wound class: Clean

## 2011-02-03 MED ORDER — GLYCOPYRROLATE 0.2 MG/ML IJ SOLN
INTRAMUSCULAR | Status: AC
  Filled 2011-02-03: qty 1

## 2011-02-03 MED ORDER — NEOSTIGMINE METHYLSULFATE 1 MG/ML IJ SOLN
INTRAMUSCULAR | Status: DC | PRN
  Administered 2011-02-03: 2 mg via INTRAVENOUS

## 2011-02-03 MED ORDER — GLYCOPYRROLATE 0.2 MG/ML IJ SOLN
INTRAMUSCULAR | Status: DC | PRN
  Administered 2011-02-03: .4 mg via INTRAVENOUS

## 2011-02-03 MED ORDER — MIDAZOLAM HCL 2 MG/2ML IJ SOLN
1.0000 mg | INTRAMUSCULAR | Status: DC | PRN
  Administered 2011-02-03: 2 mg via INTRAVENOUS

## 2011-02-03 MED ORDER — FENTANYL CITRATE 0.05 MG/ML IJ SOLN
25.0000 ug | INTRAMUSCULAR | Status: DC | PRN
  Administered 2011-02-03 (×2): 50 ug via INTRAVENOUS

## 2011-02-03 MED ORDER — MENTHOL 3 MG MT LOZG
1.0000 | LOZENGE | OROMUCOSAL | Status: DC | PRN
  Filled 2011-02-03: qty 9

## 2011-02-03 MED ORDER — FENTANYL CITRATE 0.05 MG/ML IJ SOLN
INTRAMUSCULAR | Status: AC
  Administered 2011-02-03: 50 ug via INTRAVENOUS
  Filled 2011-02-03: qty 2

## 2011-02-03 MED ORDER — OXYCODONE-ACETAMINOPHEN 5-325 MG PO TABS
1.0000 | ORAL_TABLET | ORAL | Status: DC | PRN
  Administered 2011-02-04: 1 via ORAL
  Administered 2011-02-04 (×3): 2 via ORAL
  Administered 2011-02-05: 1 via ORAL
  Filled 2011-02-03 (×3): qty 2
  Filled 2011-02-03 (×2): qty 1

## 2011-02-03 MED ORDER — ROCURONIUM BROMIDE 100 MG/10ML IV SOLN
INTRAVENOUS | Status: DC | PRN
  Administered 2011-02-03: 10 mg via INTRAVENOUS
  Administered 2011-02-03: 5 mg via INTRAVENOUS
  Administered 2011-02-03 (×2): 10 mg via INTRAVENOUS
  Administered 2011-02-03: 25 mg via INTRAVENOUS

## 2011-02-03 MED ORDER — ROCURONIUM BROMIDE 50 MG/5ML IV SOLN
INTRAVENOUS | Status: AC
  Filled 2011-02-03: qty 1

## 2011-02-03 MED ORDER — NEOSTIGMINE METHYLSULFATE 1 MG/ML IJ SOLN
INTRAMUSCULAR | Status: AC
  Filled 2011-02-03: qty 10

## 2011-02-03 MED ORDER — ZOLPIDEM TARTRATE 5 MG PO TABS: 5.0000 mg | ORAL_TABLET | Freq: Every evening | ORAL | Status: DC | PRN

## 2011-02-03 MED ORDER — ONDANSETRON HCL 4 MG/2ML IJ SOLN: 4.0000 mg | Freq: Four times a day (QID) | INTRAMUSCULAR | Status: DC | PRN

## 2011-02-03 MED ORDER — PROPOFOL 10 MG/ML IV EMUL
INTRAVENOUS | Status: AC
  Filled 2011-02-03: qty 20

## 2011-02-03 MED ORDER — LIDOCAINE HCL 1 % IJ SOLN
INTRAMUSCULAR | Status: DC | PRN
  Administered 2011-02-03: 20 mg via INTRADERMAL

## 2011-02-03 MED ORDER — CEFAZOLIN SODIUM 1-5 GM-% IV SOLN
INTRAVENOUS | Status: AC
  Filled 2011-02-03: qty 50

## 2011-02-03 MED ORDER — HYDROMORPHONE HCL 1 MG/ML IJ SOLN
2.0000 mg | INTRAMUSCULAR | Status: DC | PRN
  Administered 2011-02-03 – 2011-02-04 (×4): 2 mg via INTRAVENOUS
  Filled 2011-02-03 (×4): qty 2

## 2011-02-03 MED ORDER — DOCUSATE SODIUM 100 MG PO CAPS
100.0000 mg | ORAL_CAPSULE | Freq: Every day | ORAL | Status: DC
  Administered 2011-02-04 – 2011-02-05 (×2): 100 mg via ORAL
  Filled 2011-02-03 (×4): qty 1

## 2011-02-03 MED ORDER — BISACODYL 10 MG RE SUPP
10.0000 mg | Freq: Every day | RECTAL | Status: DC | PRN
  Filled 2011-02-03: qty 1

## 2011-02-03 MED ORDER — LIDOCAINE HCL (PF) 1 % IJ SOLN
INTRAMUSCULAR | Status: AC
  Filled 2011-02-03: qty 5

## 2011-02-03 MED ORDER — LACTATED RINGERS IV SOLN
INTRAVENOUS | Status: AC
  Filled 2011-02-03: qty 1000

## 2011-02-03 MED ORDER — IBUPROFEN 800 MG PO TABS: 800.0000 mg | ORAL_TABLET | Freq: Three times a day (TID) | ORAL | Status: DC | PRN

## 2011-02-03 MED ORDER — ONDANSETRON HCL 4 MG/2ML IJ SOLN
INTRAMUSCULAR | Status: AC
  Administered 2011-02-03: 4 mg via INTRAVENOUS
  Filled 2011-02-03: qty 2

## 2011-02-03 MED ORDER — VASOPRESSIN 20 UNIT/ML IJ SOLN
INTRAMUSCULAR | Status: AC
  Filled 2011-02-03: qty 1

## 2011-02-03 MED ORDER — ONDANSETRON HCL 4 MG PO TABS
4.0000 mg | ORAL_TABLET | Freq: Four times a day (QID) | ORAL | Status: DC | PRN
  Filled 2011-02-03: qty 1

## 2011-02-03 MED ORDER — MIDAZOLAM HCL 2 MG/2ML IJ SOLN
INTRAMUSCULAR | Status: AC
  Administered 2011-02-03: 2 mg via INTRAVENOUS
  Filled 2011-02-03: qty 2

## 2011-02-03 MED ORDER — ONDANSETRON HCL 4 MG/2ML IJ SOLN
4.0000 mg | Freq: Once | INTRAMUSCULAR | Status: AC
  Administered 2011-02-03: 4 mg via INTRAVENOUS

## 2011-02-03 MED ORDER — ONDANSETRON HCL 4 MG/2ML IJ SOLN: 4.0000 mg | Freq: Once | INTRAMUSCULAR | Status: DC | PRN

## 2011-02-03 MED ORDER — LACTATED RINGERS IV SOLN
INTRAVENOUS | Status: DC
  Administered 2011-02-03: 1000 mL via INTRAVENOUS
  Administered 2011-02-03: 09:00:00 via INTRAVENOUS

## 2011-02-03 MED ORDER — FENTANYL CITRATE 0.05 MG/ML IJ SOLN
INTRAMUSCULAR | Status: AC
  Administered 2011-02-03: 50 ug via INTRAVENOUS
  Filled 2011-02-03: qty 5

## 2011-02-03 MED ORDER — MAGNESIUM HYDROXIDE 400 MG/5ML PO SUSP
30.0000 mL | Freq: Every day | ORAL | Status: DC | PRN
  Filled 2011-02-03: qty 30

## 2011-02-03 MED ORDER — CEFAZOLIN SODIUM 1-5 GM-% IV SOLN
1.0000 g | INTRAVENOUS | Status: AC
  Administered 2011-02-03: 1 g via INTRAVENOUS

## 2011-02-03 MED ORDER — PROPOFOL 10 MG/ML IV EMUL
INTRAVENOUS | Status: DC | PRN
  Administered 2011-02-03: 20 mg via INTRAVENOUS
  Administered 2011-02-03: 150 mg via INTRAVENOUS
  Administered 2011-02-03: 30 mg via INTRAVENOUS

## 2011-02-03 MED ORDER — KCL IN DEXTROSE-NACL 20-5-0.45 MEQ/L-%-% IV SOLN
INTRAVENOUS | Status: AC
  Administered 2011-02-03: 1000 mL via INTRAVENOUS
  Administered 2011-02-03: 12:00:00 via INTRAVENOUS
  Administered 2011-02-04: 1000 mL via INTRAVENOUS

## 2011-02-03 MED ORDER — FENTANYL CITRATE 0.05 MG/ML IJ SOLN
INTRAMUSCULAR | Status: DC | PRN
  Administered 2011-02-03 (×5): 50 ug via INTRAVENOUS

## 2011-02-03 SURGICAL SUPPLY — 52 items
ADH SKN CLS APL DERMABOND .7 (GAUZE/BANDAGES/DRESSINGS) ×2
APPLIER CLIP 13 LRG OPEN (CLIP)
BAG HAMPER (MISCELLANEOUS) ×2 IMPLANT
CANISTER SUCTION 2500CC (MISCELLANEOUS) ×2 IMPLANT
CELLS DAT CNTRL 66122 CELL SVR (MISCELLANEOUS) IMPLANT
CLIP APPLIE 13 LRG OPEN (CLIP) IMPLANT
CLOTH BEACON ORANGE TIMEOUT ST (SAFETY) ×2 IMPLANT
COVER LIGHT HANDLE STERIS (MISCELLANEOUS) ×4 IMPLANT
DERMABOND ADVANCED (GAUZE/BANDAGES/DRESSINGS) ×2
DERMABOND ADVANCED .7 DNX12 (GAUZE/BANDAGES/DRESSINGS) ×2 IMPLANT
DRAPE WARM FLUID 44X44 (DRAPE) ×2 IMPLANT
DRESSING TELFA 8X3 (GAUZE/BANDAGES/DRESSINGS) ×2 IMPLANT
ELECT REM PT RETURN 9FT ADLT (ELECTROSURGICAL) ×2
ELECTRODE REM PT RTRN 9FT ADLT (ELECTROSURGICAL) ×1 IMPLANT
FORMALIN 10 PREFIL 480ML (MISCELLANEOUS) ×2 IMPLANT
GLOVE BIOGEL PI IND STRL 8 (GLOVE) ×1 IMPLANT
GLOVE BIOGEL PI IND STRL 8.5 (GLOVE) ×1 IMPLANT
GLOVE BIOGEL PI INDICATOR 8 (GLOVE) ×1
GLOVE BIOGEL PI INDICATOR 8.5 (GLOVE) ×1
GLOVE ECLIPSE 6.5 STRL STRAW (GLOVE) ×2 IMPLANT
GLOVE ECLIPSE 7.0 STRL STRAW (GLOVE) ×2 IMPLANT
GLOVE ECLIPSE 8.0 STRL XLNG CF (GLOVE) ×4 IMPLANT
GLOVE INDICATOR 7.0 STRL GRN (GLOVE) ×2 IMPLANT
GOWN BRE IMP SLV AUR XL STRL (GOWN DISPOSABLE) ×4 IMPLANT
GOWN SRG XL XLNG 56XLVL 4 (GOWN DISPOSABLE) ×1 IMPLANT
GOWN STRL NON-REIN XL XLG LVL4 (GOWN DISPOSABLE) ×1
GOWN STRL REIN 3XL XLG LVL4 (GOWN DISPOSABLE) ×2 IMPLANT
INST SET MAJOR GENERAL (KITS) ×2 IMPLANT
KIT ROOM TURNOVER APOR (KITS) ×2 IMPLANT
MANIFOLD NEPTUNE II (INSTRUMENTS) ×2 IMPLANT
NS IRRIG 1000ML POUR BTL (IV SOLUTION) ×4 IMPLANT
PACK ABDOMINAL MAJOR (CUSTOM PROCEDURE TRAY) ×2 IMPLANT
PAD ARMBOARD 7.5X6 YLW CONV (MISCELLANEOUS) ×2 IMPLANT
RETRACTOR WND ALEXIS 25 LRG (MISCELLANEOUS) IMPLANT
RTRCTR WOUND ALEXIS 18CM MED (MISCELLANEOUS)
RTRCTR WOUND ALEXIS 25CM LRG (MISCELLANEOUS)
SEPRAFILM MEMBRANE 5X6 (MISCELLANEOUS) IMPLANT
SET BASIN LINEN APH (SET/KITS/TRAYS/PACK) ×2 IMPLANT
SPONGE LAP 18X18 X RAY DECT (DISPOSABLE) ×2 IMPLANT
STAPLER VISISTAT 35W (STAPLE) ×2 IMPLANT
SUT CHROMIC 0 CT 1 (SUTURE) ×2 IMPLANT
SUT MON AB 0 CT1 (SUTURE) ×30 IMPLANT
SUT MON AB 3-0 SH 27 (SUTURE) ×12 IMPLANT
SUT PLAIN 2 0 XLH (SUTURE) ×4 IMPLANT
SUT VIC AB 0 CT1 27 (SUTURE) ×1
SUT VIC AB 0 CT1 27XBRD ANTBC (SUTURE) IMPLANT
SUT VIC AB 0 CT1 27XCR 8 STRN (SUTURE) ×1 IMPLANT
SUT VIC AB 0 CTX 36 (SUTURE) ×1
SUT VIC AB 0 CTX36XBRD ANTBCTR (SUTURE) ×1 IMPLANT
SUT VICRYL 3 0 (SUTURE) ×2 IMPLANT
TOWEL BLUE STERILE X RAY DET (MISCELLANEOUS) ×2 IMPLANT
TRAY FOLEY CATH 14FR (SET/KITS/TRAYS/PACK) ×2 IMPLANT

## 2011-02-03 NOTE — Anesthesia Procedure Notes (Signed)
Procedure Name: Intubation Date/Time: 02/03/2011 8:07 AM Performed by: Jessica Sanchez Pre-anesthesia Checklist: Patient identified, Patient being monitored, Timeout performed, Emergency Drugs available and Suction available Patient Re-evaluated:Patient Re-evaluated prior to inductionOxygen Delivery Method: Circle System Utilized Preoxygenation: Pre-oxygenation with 100% oxygen Intubation Type: IV induction Ventilation: Mask ventilation without difficulty Laryngoscope Size: Miller and 2 Grade View: Grade I Tube type: Oral Tube size: 7.0 mm Number of attempts: 1 Airway Equipment and Method: stylet Placement Confirmation: ETT inserted through vocal cords under direct vision,  positive ETCO2 and breath sounds checked- equal and bilateral Secured at: 20 cm Tube secured with: Tape Dental Injury: Teeth and Oropharynx as per pre-operative assessment

## 2011-02-03 NOTE — Interval H&P Note (Signed)
History and Physical Interval Note:   02/03/2011   7:44 AM   Jessica Sanchez  has presented today for surgery, with the diagnosis of uterine fibroids  The various methods of treatment have been discussed with the patient and family. After consideration of risks, benefits and other options for treatment, the patient has consented to  Procedure(s): MYOMECTOMY as a surgical intervention .  I have reviewed the patients' chart and labs.  Questions were answered to the patient's satisfaction.     Lazaro Arms  MD

## 2011-02-03 NOTE — Anesthesia Preprocedure Evaluation (Addendum)
Anesthesia Evaluation  Name, MR# and DOB Patient awake  General Assessment Comment  Reviewed: Allergy & Precautions, H&P , NPO status , Patient's Chart, lab work & pertinent test results  History of Anesthesia Complications Negative for: history of anesthetic complications  Airway       Dental   Pulmonary          Cardiovascular     Neuro/Psych    GI/Hepatic negative GI ROS   Endo/Other    Renal/GU      Musculoskeletal   Abdominal   Peds  Hematology   Anesthesia Other Findings   Reproductive/Obstetrics                          Anesthesia Physical Anesthesia Plan  ASA: I  Anesthesia Plan: General   Post-op Pain Management:    Induction: Intravenous  Airway Management Planned: Oral ETT  Additional Equipment:   Intra-op Plan:   Post-operative Plan:   Informed Consent: I have reviewed the patients History and Physical, chart, labs and discussed the procedure including the risks, benefits and alternatives for the proposed anesthesia with the patient or authorized representative who has indicated his/her understanding and acceptance.     Plan Discussed with:   Anesthesia Plan Comments:         Anesthesia Quick Evaluation

## 2011-02-03 NOTE — Op Note (Signed)
Preoperative diagnosis:  1.   Enlarged fibroid uterus, multiple fibroids                                         2.   menometrorrhagia and dysmenorrhea                                         3.   abdominal pain secondary to fibroids                                         4.   first trimester pregnancy loss  Postoperative diagnosis:  Same as above + total of 14 myomas.  Endometrial cavity entry necessitating caesarean delivery and no laboring if patient has her                                                 successful and the  pregnancies in the future.  Procedure:  Abdominal myomectomy  Surgeon:  Rockne Coons MD  Anesthesia:  Gen. Endotracheal  Findings:  The patient has not been known for some time to have multiple large fibroids.  On exam they are 18 week size.  Ultrasound in the office revealed many many fibroids greater than 10 with the largest being 6.1 cm.  The patient has been having increasing abdominal pain dysmenorrhea menometrorrhagia which we controlled using megestrol.  She has also been anemic in the past which is now improved.  Recently she suffered a first trimester pregnancy loss and she understands the position and size of her fibroids and the shear number make it very difficult to have a successful pregnancy outcome.  As a result she is admitted for an abdominal myomectomy.  Intraoperatively,  14 fibroids in total were removed, again varying in size from less than a centimeter up to about 7 cm.  The tubes and ovaries were otherwise normal and there were no other intraperitoneal abnormalities.  Description of operation:  The patient was taken to the operating room and placed in the supine position where she underwent general endotracheal anesthesia.  The vagina was prepped and a Foley catheter was placed.  The abdomen was prepped and draped in the usual sterile fashion from xiphoid to the mid thigh and all the way down to the table.  A Pfannenstiel skin incision was made and  carried down sharply to the rectus fascia which was scored in the midline extended laterally.  The fascia was taken off the muscles superiorly and inferiorly,  the muscles were divided in the peritoneal cavity was entered.  The uterus was quite large and was delivered through the abdominal incision.  Throughout the procedure the uterus tubes and ovaries were kept moist with saline.  I was unable to remove all the fibroids through a single uterine incision so I had to make 3 separate incisions to remove all the myomas.  Two of the incisions were anterior and one was on the posterior surface of the uterus.  The Bovie electrocautery was used to make the incisions using a needle tip.  Each myoma was then dissected out manually and the needle tip Bovie was again used to ablate the blood supply as it was being dissected out of the myometrial bed. All 14 myomas were removed in this fashion.  Each incision was closed in multiple layers with the most superficial layer being a baseball stitch of 3-0 Monocryl the deeper layers were all closed using 0 Monocryl in an interrupted single suture fashion or in a figure-of-eight fashion depending onbleeding and  blood supply issues.  The endometrial cavity was entered while removing the large posterior myoma so the patient will indeed require a cesarean section for delivery of any children.  She should not be allowed to labor.  Observe the uterus for quite some time after all of the incisions were closed and none of the uterine incisions were bleeding.  I purposely did not use vasopressin so as not to have latent labor bleeding.  Blood loss for the procedure was 500 cc.  Fluid was left in the pelvis to minimize postoperative adhesion formation.  The muscles and peritoneum were reapproximated loosely. The fascia was closed using 0 Vicryl in a running fashion. The subcutaneous tissue was made hemostatic irrigated and loosely reapproximated. The skin was closed using 3 Vicryl on a Keith  needle in a subcuticular fashion as well as Dermabond to serve as a topical bandage.  All sponge needle instrument counts were correct x3. The patient received Ancef 1 g preoperatively.  She was taken to the recovery room in good stable condition and again the estimated blood loss was 500 cc.   Marciana Uplinger H 10:46 AM 02/03/2011

## 2011-02-03 NOTE — Progress Notes (Signed)
Hgb, Hct drawn per lab.

## 2011-02-03 NOTE — Transfer of Care (Signed)
Immediate Anesthesia Transfer of Care Note  Patient: Jessica Sanchez  Procedure(s) Performed:  MYOMECTOMY  Patient Location: PACU  Anesthesia Type: General  Level of Consciousness: awake  Airway & Oxygen Therapy: Patient Spontanous Breathing and non-rebreather face mask  Post-op Assessment: Report given to PACU RN, Post -op Vital signs reviewed and stable and Patient moving all extremities  Post vital signs: Reviewed and stable  Complications: No apparent anesthesia complications

## 2011-02-03 NOTE — Anesthesia Postprocedure Evaluation (Signed)
  Anesthesia Post-op Note  Patient: Jessica Sanchez  Procedure(s) Performed:  MYOMECTOMY  Patient Location: PACU  Anesthesia Type: General  Level of Consciousness: alert   Airway and Oxygen Therapy: Patient Spontanous Breathing  Post-op Pain: mild  Post-op Assessment: Patient's Cardiovascular Status Stable, Respiratory Function Stable, Patent Airway and No signs of Nausea or vomiting  Post-op Vital Signs: stable  Complications: No apparent anesthesia complications

## 2011-02-04 LAB — COMPREHENSIVE METABOLIC PANEL
AST: 14 U/L (ref 0–37)
Albumin: 3.3 g/dL — ABNORMAL LOW (ref 3.5–5.2)
BUN: 5 mg/dL — ABNORMAL LOW (ref 6–23)
Calcium: 8.6 mg/dL (ref 8.4–10.5)
Creatinine, Ser: 0.71 mg/dL (ref 0.50–1.10)
Total Protein: 6.2 g/dL (ref 6.0–8.3)

## 2011-02-04 LAB — CBC
HCT: 32.4 % — ABNORMAL LOW (ref 36.0–46.0)
MCHC: 33.3 g/dL (ref 30.0–36.0)
MCV: 83.7 fL (ref 78.0–100.0)
Platelets: 209 10*3/uL (ref 150–400)
RDW: 13.8 % (ref 11.5–15.5)
WBC: 9.7 10*3/uL (ref 4.0–10.5)

## 2011-02-04 MED ORDER — CEFAZOLIN SODIUM 1 G IJ SOLR: 1.0000 g | Freq: Three times a day (TID) | INTRAMUSCULAR | Status: DC

## 2011-02-04 MED ORDER — SODIUM CHLORIDE 0.9 % IJ SOLN
INTRAMUSCULAR | Status: AC
  Administered 2011-02-04: 3 mL
  Filled 2011-02-04: qty 3

## 2011-02-04 MED ORDER — ACETAMINOPHEN 325 MG PO TABS: 650.0000 mg | ORAL_TABLET | ORAL | Status: DC | PRN

## 2011-02-04 MED ORDER — ACETAMINOPHEN 325 MG PO TABS
650.0000 mg | ORAL_TABLET | Freq: Once | ORAL | Status: AC
  Administered 2011-02-04: 650 mg via ORAL

## 2011-02-04 MED ORDER — CEFAZOLIN SODIUM 1-5 GM-% IV SOLN
1.0000 g | Freq: Four times a day (QID) | INTRAVENOUS | Status: DC
  Administered 2011-02-04 – 2011-02-05 (×5): 1 g via INTRAVENOUS
  Filled 2011-02-04 (×18): qty 50

## 2011-02-04 MED ORDER — CEFAZOLIN SODIUM 1-5 GM-% IV SOLN
INTRAVENOUS | Status: AC
  Filled 2011-02-04: qty 50

## 2011-02-04 MED ORDER — ACETAMINOPHEN 325 MG PO TABS
ORAL_TABLET | ORAL | Status: AC
  Administered 2011-02-04: 650 mg via ORAL
  Filled 2011-02-04: qty 2

## 2011-02-04 NOTE — Progress Notes (Signed)
Encounter addended by: Minerva Areola, CRNA on: 02/04/2011  2:11 PM<BR>     Documentation filed: Notes Section

## 2011-02-04 NOTE — Anesthesia Postprocedure Evaluation (Signed)
Anesthesia Post Note  Patient: Jessica Sanchez  Procedure(s) Performed:  MYOMECTOMY  Anesthesia type: General  Patient location: Room 324  Post pain: Pain level controlled  Post assessment: Post-op Vital signs reviewed, Patient's Cardiovascular Status Stable, Respiratory Function Stable, Patent Airway, No signs of Nausea or vomiting and Pain level controlled  Last Vitals:  Filed Vitals:   02/04/11 0700  BP: 122/77  Pulse: 112  Temp: 98 F (36.7 C)  Resp: 18    Post vital signs: Reviewed and stable  Level of consciousness: sleepy  Complications: No apparent anesthesia complications

## 2011-02-04 NOTE — Progress Notes (Signed)
1 Day Post-Op Procedure(s): MYOMECTOMY  Subjective: Patient reports incisional pain and tolerating PO.    Objective: I have reviewed patient's vital signs, intake and output, medications and labs.  General: alert, cooperative and no distress Cardio: regular rate and rhythm, S1, S2 normal, no murmur, click, rub or gallop GI: soft, non-tender; bowel sounds normal; no masses,  no organomegaly  Incision clean dry intact  Assessment: s/p Procedure(s): MYOMECTOMY: stable  Plan: Advance diet Encourage ambulation Advance to PO medication Discontinue IV fluids Discharge home  tomorrow  LOS: 1 day    Gola Bribiesca H 02/04/2011, 5:08 PM

## 2011-02-05 MED ORDER — IBUPROFEN 800 MG PO TABS: 800.0000 mg | ORAL_TABLET | Freq: Three times a day (TID) | ORAL | Status: AC | PRN

## 2011-02-05 MED ORDER — OXYCODONE-ACETAMINOPHEN 5-325 MG PO TABS: 1.0000 | ORAL_TABLET | ORAL | Status: AC | PRN

## 2011-02-05 MED ORDER — CIPROFLOXACIN HCL 500 MG PO TABS: 500.0000 mg | ORAL_TABLET | Freq: Two times a day (BID) | ORAL | Status: AC

## 2011-02-05 NOTE — Discharge Summary (Signed)
Physician Discharge Summary  Patient ID: Jessica Sanchez MRN: [id number] DOB/AGE: 1985/08/06 25 y.o.  Admit date: 02/03/2011 Discharge date: 02/05/2011  Admission Diagnoses: Status post abdominal myomectomy  Discharge Diagnoses:  same  Discharged Condition: good  Hospital Course: unremarkable  Consults: none  Significant Diagnostic Studies: labs: routine  Treatments: surgery: myomectomy  Discharge Exam: Blood pressure 123/74, pulse 129, temperature 98.6 F (37 C), temperature source Oral, resp. rate 18, height 5\' 5"  (1.651 m), weight 210 lb (95.255 kg), last menstrual period 10/23/2010, SpO2 92.00%, unknown if currently breastfeeding. General appearance: alert, cooperative and no distress Resp: clear to auscultation bilaterally and normal percussion bilaterally Cardio: regular rate and rhythm, S1, S2 normal, no murmur, click, rub or gallop GI: soft, non-tender; bowel sounds normal; no masses,  no organomegaly Incision/Wound:clean dry intact  Disposition: Home or Self Care  Discharge Orders    Future Orders Please Complete By Expires   Diet general      Increase activity slowly      Driving Restrictions      Comments:   Until instructed   Lifting restrictions      Comments:   Not greater than 10 lbs   Sexual Activity Restrictions      Comments:   None for 6 weeks   Discharge wound care:      Comments:   Keep clean and dry   Call MD for:  temperature >100.4      Call MD for:  persistant nausea and vomiting      Call MD for:  severe uncontrolled pain      Call MD for:  redness, tenderness, or signs of infection (pain, swelling, redness, odor or green/yellow discharge around incision site)        Current Discharge Medication List    START taking these medications   Details  ciprofloxacin (CIPRO) 500 MG tablet Take 1 tablet (500 mg total) by mouth 2 (two) times daily. Qty: 14 tablet, Refills: 0    ibuprofen (ADVIL,MOTRIN) 800 MG tablet Take 1 tablet (800 mg  total) by mouth every 8 (eight) hours as needed for pain (mild pain). Qty: 30 tablet, Refills: 0    oxyCODONE-acetaminophen (PERCOCET) 5-325 MG per tablet Take 1-2 tablets by mouth every 4 (four) hours as needed for pain (moderate to severe pain (when tolerating fluids)). Qty: 30 tablet, Refills: 0      STOP taking these medications     Prenatal Vit-Fe Fumarate-FA (PRENATAL MULTIVITAMIN) 60-1 MG tablet        Follow-up Information    Follow up with Lazaro Arms, MD on 02/10/2011. (Patient already has appointment)    Contact information:   [facility] [address] [place] [address] [phone]          Signed: Lazaro Arms 02/05/2011, 9:43 AM

## 2011-02-05 NOTE — Progress Notes (Signed)
Discharge Summary: A/o.vss. Up ad lib. Dressing to abdomen CDI. Saline lock removed. Discharge instructions given. Prescriptions given. Pt verbalized understanding of instructions. Left floor via wheelchair with nursing staff and family member.

## 2011-02-09 ENCOUNTER — Encounter (HOSPITAL_COMMUNITY): Payer: Self-pay | Admitting: Obstetrics & Gynecology

## 2011-02-09 LAB — CULTURE, BLOOD (SINGLE): Culture: NO GROWTH

## 2012-03-18 ENCOUNTER — Emergency Department (HOSPITAL_COMMUNITY)
Admission: EM | Admit: 2012-03-18 | Discharge: 2012-03-19 | Disposition: A | Discharge status: Home / Self Care | Payer: Worker's Compensation | Attending: Emergency Medicine | Admitting: Emergency Medicine

## 2012-03-18 ENCOUNTER — Emergency Department (HOSPITAL_COMMUNITY): Payer: Worker's Compensation

## 2012-03-18 ENCOUNTER — Encounter (HOSPITAL_COMMUNITY): Payer: Self-pay

## 2012-03-18 DIAGNOSIS — Y9289 Other specified places as the place of occurrence of the external cause: Secondary | ICD-10-CM | POA: Insufficient documentation

## 2012-03-18 DIAGNOSIS — Z8742 Personal history of other diseases of the female genital tract: Secondary | ICD-10-CM | POA: Insufficient documentation

## 2012-03-18 DIAGNOSIS — S6390XA Sprain of unspecified part of unspecified wrist and hand, initial encounter: Secondary | ICD-10-CM | POA: Insufficient documentation

## 2012-03-18 DIAGNOSIS — R209 Unspecified disturbances of skin sensation: Secondary | ICD-10-CM | POA: Insufficient documentation

## 2012-03-18 DIAGNOSIS — Y9389 Activity, other specified: Secondary | ICD-10-CM | POA: Insufficient documentation

## 2012-03-18 DIAGNOSIS — Y990 Civilian activity done for income or pay: Secondary | ICD-10-CM | POA: Insufficient documentation

## 2012-03-18 DIAGNOSIS — W230XXA Caught, crushed, jammed, or pinched between moving objects, initial encounter: Secondary | ICD-10-CM | POA: Insufficient documentation

## 2012-03-18 DIAGNOSIS — IMO0002 Reserved for concepts with insufficient information to code with codable children: Secondary | ICD-10-CM

## 2012-03-18 MED ORDER — IBUPROFEN 800 MG PO TABS
800.0000 mg | ORAL_TABLET | Freq: Once | ORAL | Status: AC
  Administered 2012-03-18: 800 mg via ORAL
  Filled 2012-03-18: qty 1

## 2012-03-18 NOTE — ED Notes (Signed)
Patient returned to room. Did not have proper identification for the drug screen.

## 2012-03-18 NOTE — ED Notes (Signed)
Patient to lab for drug screen.

## 2012-03-18 NOTE — ED Provider Notes (Signed)
History     CSN: [id number]  Arrival date & time 03/18/12  2156   First MD Initiated Contact with Patient 03/18/12 2220      Chief Complaint  Patient presents with  . Finger Injury    (Consider location/radiation/quality/duration/timing/severity/associated sxs/prior treatment) HPI Comments: Jessica Sanchez presents for evaluation of pain in her right index finger after jambing it against a heavy box she was attempting to lift.  She describes constant throbbing pain in the proximal finger along with tingling in the distal finger.  She is unable to bend her finger secondary to pain.  She has taken no medications or treatments prior to arrival.  The history is provided by the patient.    Past Medical History  Diagnosis Date  . Fibroid, uterine   . Fibroid (bleeding) (uterine)     Past Surgical History  Procedure Date  . Tonsillectomy   . Myomectomy 02/03/2011    Procedure: MYOMECTOMY;  Surgeon: Lazaro Arms, MD;  Location: [facility];  Service: Gynecology;  Laterality: N/A;    Family History  Problem Relation Age of Onset  . Anesthesia problems Neg Hx   . Hypotension Neg Hx   . Malignant hyperthermia Neg Hx   . Pseudochol deficiency Neg Hx     History  Substance Use Topics  . Smoking status: Never Smoker   . Smokeless tobacco: Not on file  . Alcohol Use: No    OB History    Grav Para Term Preterm Abortions TAB SAB Ect Mult Living   1               Review of Systems  Musculoskeletal: Positive for arthralgias. Negative for joint swelling.  Skin: Negative for wound.  Neurological: Positive for numbness. Negative for weakness.    Allergies  Review of patient's allergies indicates no known allergies.  Home Medications   Current Outpatient Rx  Name  Route  Sig  Dispense  Refill  . IBUPROFEN 600 MG PO TABS   Oral   Take 1 tablet (600 mg total) by mouth every 6 (six) hours as needed for pain.   30 tablet   0     BP 125/75  Pulse 98  Temp 98.4 F (36.9 C)  (Oral)  Resp 16  Ht 5\' 4"  (1.626 m)  Wt 204 lb (92.534 kg)  BMI 35.02 kg/m2  SpO2 100%  LMP 01/30/2012  Breastfeeding? No  Physical Exam  Constitutional: She appears well-developed and well-nourished.  HENT:  Head: Atraumatic.  Neck: Normal range of motion.  Cardiovascular:       Pulses equal bilaterally  Musculoskeletal: She exhibits tenderness. She exhibits no edema.       TTP left index finger,  Proximal phalanx.  No deformity.  Cap refill less than 3 seconds.  No apparent ligament instability in joints of finger .  No pain in hand with palpation.    Neurological: She is alert. She has normal strength. She displays normal reflexes. No sensory deficit.       Equal strength  Skin: Skin is warm and dry.  Psychiatric: She has a normal mood and affect.    ED Course  Procedures (including critical care time)  Labs Reviewed - No data to display Dg Finger Index Left  03/18/2012  *RADIOLOGY REPORT*  Clinical Data: Left second digit pain and swelling status post trauma.  LEFT INDEX FINGER 2+V  Comparison: None.  Findings: No displaced fracture.  No dislocation.  No aggressive osseous lesion.  No radiopaque foreign body.  IMPRESSION: No acute osseous abnormality of the second digit left hand. If clinical concern for a fracture persists, recommend a repeat radiograph in 5-10 days to evaluate for interval change or callus formation.   Original Report Authenticated By: Jearld Lesch, M.D.      1. Strain of finger of left hand       MDM  Xray reviewed and discussed with patient.  Finger splint applied by RN.  Prescribed ibuprofen,  Encouraged ice, elevation.  Recheck by pcp if not better x 1 week.        Burgess Amor, PA 03/19/12 0128  Burgess Amor, PA 03/19/12 [phone]

## 2012-03-18 NOTE — ED Notes (Signed)
Patient to lab for drug screen.  

## 2012-03-18 NOTE — ED Notes (Signed)
Hurt my left index finger at work. Numb per pt. Work at [employer] per pt.

## 2012-03-19 MED ORDER — IBUPROFEN 600 MG PO TABS: 600.0000 mg | ORAL_TABLET | Freq: Four times a day (QID) | ORAL | Status: DC | PRN

## 2012-03-19 NOTE — ED Provider Notes (Signed)
Medical screening examination/treatment/procedure(s) were performed by non-physician practitioner and as supervising physician I was immediately available for consultation/collaboration.   Benny Lennert, MD 03/19/12 [phone]

## 2012-10-11 ENCOUNTER — Other Ambulatory Visit (HOSPITAL_COMMUNITY): Payer: Self-pay | Admitting: Nurse Practitioner

## 2012-10-11 DIAGNOSIS — N920 Excessive and frequent menstruation with regular cycle: Secondary | ICD-10-CM

## 2012-10-13 ENCOUNTER — Ambulatory Visit (HOSPITAL_COMMUNITY)
Admission: RE | Admit: 2012-10-13 | Discharge: 2012-10-13 | Disposition: A | Discharge status: Home / Self Care | Payer: BC Managed Care – PPO | Source: Ambulatory Visit | Attending: Nurse Practitioner | Admitting: Nurse Practitioner

## 2012-10-13 ENCOUNTER — Ambulatory Visit (HOSPITAL_COMMUNITY): Payer: BC Managed Care – PPO

## 2012-10-13 DIAGNOSIS — N920 Excessive and frequent menstruation with regular cycle: Secondary | ICD-10-CM | POA: Insufficient documentation

## 2012-10-30 ENCOUNTER — Encounter: Payer: Self-pay | Admitting: *Deleted

## 2012-10-30 ENCOUNTER — Encounter: Payer: Self-pay | Admitting: Obstetrics & Gynecology

## 2012-10-30 ENCOUNTER — Ambulatory Visit (INDEPENDENT_AMBULATORY_CARE_PROVIDER_SITE_OTHER): Payer: BC Managed Care – PPO | Admitting: Obstetrics & Gynecology

## 2012-10-30 VITALS — BP 102/60 | Wt 214.0 lb

## 2012-10-30 DIAGNOSIS — N949 Unspecified condition associated with female genital organs and menstrual cycle: Secondary | ICD-10-CM

## 2012-10-30 DIAGNOSIS — N938 Other specified abnormal uterine and vaginal bleeding: Secondary | ICD-10-CM

## 2012-10-30 LAB — POCT HEMOGLOBIN: Hemoglobin: 12.8 g/dL (ref 12.2–16.2)

## 2012-10-30 MED ORDER — MEGESTROL ACETATE 40 MG PO TABS: ORAL_TABLET | ORAL | Status: DC

## 2012-10-30 NOTE — Progress Notes (Signed)
Patient ID: Jessica Sanchez, female   DOB: [birth year], 27 y.o.   MRN: [id number] SANIAH SCHROETER time since her postoperative encounter back in June of 2012 at which time I did an abdominal myomectomy and removed 14 fibroids We did a because of pelvic pain and bleeding  Last month early part of May she began having heavy bleeding again  quite some time Her hemoglobin today is 12.8 She had a sonogram done at the hospital which was completely normal no fibroid seen Is reviewed today  Patient was at without any other complaint  Placed around megestrol 40 mg 3 a day for 5 days 2 a day for 5 days then one a day to finish 45 tablet At that time. A Megace and see if her periods returned to normal If not we will address that at that time I'll see her back in 8 weeks

## 2012-10-30 NOTE — Patient Instructions (Signed)

## 2012-12-18 ENCOUNTER — Encounter: Payer: Self-pay | Admitting: Obstetrics & Gynecology

## 2012-12-18 ENCOUNTER — Ambulatory Visit (INDEPENDENT_AMBULATORY_CARE_PROVIDER_SITE_OTHER): Payer: BC Managed Care – PPO | Admitting: Obstetrics & Gynecology

## 2012-12-18 VITALS — BP 110/70 | Wt 220.0 lb

## 2012-12-18 DIAGNOSIS — N898 Other specified noninflammatory disorders of vagina: Secondary | ICD-10-CM

## 2012-12-18 DIAGNOSIS — N939 Abnormal uterine and vaginal bleeding, unspecified: Secondary | ICD-10-CM

## 2012-12-18 MED ORDER — LEUPROLIDE ACETATE (3 MONTH) 11.25 MG IM KIT: 11.2500 mg | PACK | INTRAMUSCULAR | Status: DC

## 2012-12-18 NOTE — Progress Notes (Signed)
Patient ID: Jessica Sanchez, female   DOB: Jan 25, 1986, 27 y.o.   MRN: [id number] [name] is in for followup from her visit on June 30. Please refer to that note for the details of her initial presentation  Since that time [name] has been taking the Megace except for the last 2 weeks when she ran out per my plan but has been bleeding essentially entire time Sometimes heavy sometimes lighter but still bleeding a significant amount  Her sonogram is once again reviewed and found to be completely normal with no anatomical abnormalities to explain her bleeding  We discussed more permanent options but I really don't ago to that at this point given her history of myomectomy etc. try to proceed preserve her childbearing ability  As a result vitals Lupron 11.25 mg IM x1 and see if that can alleviate the problem and hopefully get her back on track I'll see birth control pills as being a viable option given the fact regarding use topics high-dose Megace

## 2012-12-18 NOTE — Patient Instructions (Signed)

## 2012-12-19 ENCOUNTER — Telehealth: Payer: Self-pay | Admitting: Obstetrics & Gynecology

## 2012-12-19 NOTE — Telephone Encounter (Signed)
Pt called wanting Rx called to a different pharmacy. Called to [facility] per pt and Dr. Despina Hidden. Pt aware of this and that [facility] would be contacting her for insurance information.

## 2012-12-19 NOTE — Telephone Encounter (Signed)
Lupron called in per pt pharmacy request

## 2012-12-21 ENCOUNTER — Telehealth: Payer: Self-pay | Admitting: Obstetrics & Gynecology

## 2012-12-21 NOTE — Telephone Encounter (Signed)
Left message x 1. JSY 

## 2012-12-25 ENCOUNTER — Telehealth: Payer: Self-pay | Admitting: Obstetrics & Gynecology

## 2012-12-25 NOTE — Telephone Encounter (Signed)
Pt states that Dr. Despina Hidden gave her RX for Lupron. Medication is too expensive. Is there any thing we can do other than this medication?

## 2012-12-26 ENCOUNTER — Telehealth: Payer: Self-pay | Admitting: Obstetrics & Gynecology

## 2012-12-26 NOTE — Telephone Encounter (Signed)
Pt had used megace in past but has not refilled, has continued to bleed and was rx'd lupron which has a $179 co pay and can not afford, get megace refilled and take 3 x 5 days then 2 x 5 days then call in follow up to see if bleeding stops

## 2012-12-26 NOTE — Telephone Encounter (Signed)
No alternative, the megace is the best alternative I have but wasn't working.  The patient can try restarting the megace again 2-3 per day but that is the best alternative I have.

## 2012-12-26 NOTE — Telephone Encounter (Signed)
Pt informed per Dr. Despina Hidden can retry Megace. Pt verbalized understanding.

## 2013-01-05 ENCOUNTER — Ambulatory Visit: Payer: BC Managed Care – PPO | Admitting: Obstetrics & Gynecology

## 2013-01-26 ENCOUNTER — Ambulatory Visit (INDEPENDENT_AMBULATORY_CARE_PROVIDER_SITE_OTHER): Payer: BC Managed Care – PPO | Admitting: Obstetrics & Gynecology

## 2013-01-26 ENCOUNTER — Encounter: Payer: Self-pay | Admitting: Obstetrics & Gynecology

## 2013-01-26 VITALS — BP 110/80 | Ht 63.0 in | Wt 227.0 lb

## 2013-01-26 DIAGNOSIS — N949 Unspecified condition associated with female genital organs and menstrual cycle: Secondary | ICD-10-CM

## 2013-01-26 DIAGNOSIS — N938 Other specified abnormal uterine and vaginal bleeding: Secondary | ICD-10-CM

## 2013-01-26 MED ORDER — NORETHINDRONE-ETH ESTRADIOL 1-50 MG-MCG PO TABS: 1.0000 | ORAL_TABLET | Freq: Every day | ORAL | Status: DC

## 2013-01-26 NOTE — Progress Notes (Signed)
Patient ID: Jessica Sanchez, female   DOB: 1986/04/17, 27 y.o.   MRN: [id number] [name] is in for followup Unfortunately she was unable to get the Lupron injection also cost Because she restarted the megestrol and unfortunately she is still bleeding sometimes quite heavy On a positive note her hemoglobin today is 14.0  I am going to stop her Megace and leave her off everything for 2 weeks I have prescribed Ovcon-50 for her to start taking thereafter  On review [name] of course had a myomectomy in 2012 because of large fibroids but repeat sonogram x2 his chin no fibroids being present and a quite thin endometrial stripe  1 she gets and birth control pills for a month she will call me and let me know how it's going

## 2013-01-29 ENCOUNTER — Telehealth: Payer: Self-pay | Admitting: Obstetrics & Gynecology

## 2013-01-29 NOTE — Telephone Encounter (Signed)
Spoke with pt and advised her to call her pharmacy tomorrow and see if new Rx has been sent if not call our office back. Refill request has already been sent ot [facility].

## 2013-02-05 ENCOUNTER — Telehealth: Payer: Self-pay | Admitting: *Deleted

## 2013-02-05 NOTE — Telephone Encounter (Signed)
Pt aware can start OCP today per Dr. Despina Hidden.

## 2013-02-05 NOTE — Telephone Encounter (Signed)
That is fine, go ahead and start

## 2013-02-05 NOTE — Telephone Encounter (Signed)
Pt states not to start her OCP for abnormal bleeding until Friday per Dr. Despina Hidden. Pt wanting to start her OCP today. Please advise.

## 2013-03-02 NOTE — Telephone Encounter (Signed)
Nurse lmom

## 2013-03-15 ENCOUNTER — Encounter (INDEPENDENT_AMBULATORY_CARE_PROVIDER_SITE_OTHER): Payer: Self-pay

## 2013-03-15 ENCOUNTER — Ambulatory Visit (INDEPENDENT_AMBULATORY_CARE_PROVIDER_SITE_OTHER): Payer: BC Managed Care – PPO | Admitting: Obstetrics & Gynecology

## 2013-03-15 ENCOUNTER — Encounter: Payer: Self-pay | Admitting: Obstetrics & Gynecology

## 2013-03-15 VITALS — BP 116/78 | Ht 62.0 in | Wt 228.8 lb

## 2013-03-15 DIAGNOSIS — N939 Abnormal uterine and vaginal bleeding, unspecified: Secondary | ICD-10-CM

## 2013-03-15 DIAGNOSIS — N949 Unspecified condition associated with female genital organs and menstrual cycle: Secondary | ICD-10-CM

## 2013-03-15 DIAGNOSIS — N898 Other specified noninflammatory disorders of vagina: Secondary | ICD-10-CM

## 2013-03-15 DIAGNOSIS — N938 Other specified abnormal uterine and vaginal bleeding: Secondary | ICD-10-CM

## 2013-03-15 MED ORDER — MEGESTROL ACETATE 40 MG PO TABS: ORAL_TABLET | ORAL | Status: DC

## 2013-03-15 NOTE — Progress Notes (Signed)
Patient ID: Jessica Sanchez, female   DOB: March 14, 1986, 27 y.o.   MRN: [id number] Pt continues to have problems with DUB Again to recap had abdominal myomectomy 2012 with removal of 14 myomas, sonogram in August 2014 reveals no pelvic patholgy Has not responded to megace or Ovcon 50 Can't get Lupron due to expense  Will stay off e verything for a week the restart megestrol and stay on higher dose See after the first of the year and if no better consider hysteroscopy and uterine curretage

## 2013-05-18 ENCOUNTER — Ambulatory Visit: Payer: BC Managed Care – PPO | Admitting: Obstetrics & Gynecology

## 2013-05-21 ENCOUNTER — Ambulatory Visit (INDEPENDENT_AMBULATORY_CARE_PROVIDER_SITE_OTHER): Payer: BC Managed Care – PPO | Admitting: Obstetrics & Gynecology

## 2013-05-21 ENCOUNTER — Encounter: Payer: Self-pay | Admitting: Obstetrics & Gynecology

## 2013-05-21 ENCOUNTER — Encounter (INDEPENDENT_AMBULATORY_CARE_PROVIDER_SITE_OTHER): Payer: Self-pay

## 2013-05-21 VITALS — BP 104/70 | Ht 62.0 in | Wt 229.5 lb

## 2013-05-21 DIAGNOSIS — N390 Urinary tract infection, site not specified: Secondary | ICD-10-CM

## 2013-05-21 LAB — POCT URINALYSIS DIPSTICK
Glucose, UA: NEGATIVE
Ketones, UA: NEGATIVE
LEUKOCYTES UA: NEGATIVE
NITRITE UA: NEGATIVE
PROTEIN UA: NEGATIVE
RBC UA: 3

## 2013-05-21 MED ORDER — MEGESTROL ACETATE 40 MG PO TABS: ORAL_TABLET | ORAL | Status: DC

## 2013-05-21 MED ORDER — CIPROFLOXACIN HCL 500 MG PO TABS: 500.0000 mg | ORAL_TABLET | Freq: Two times a day (BID) | ORAL | Status: DC

## 2013-05-21 NOTE — Addendum Note (Signed)
Addended by: Doyne Keel on: 05/21/2013 03:29 PM   Modules accepted: Orders

## 2013-05-21 NOTE — Progress Notes (Signed)
Patient ID: Jessica Sanchez, female   DOB: 1985/07/11, 28 y.o.   MRN: [id number] Pt with pressure 2 days ago Having trouble going Also with constipation  Urine with 3+blood No other abnormalities   Will treat with cipro and dulcolax supposoiries

## 2013-06-21 ENCOUNTER — Other Ambulatory Visit: Payer: Self-pay | Admitting: Obstetrics and Gynecology

## 2013-09-18 ENCOUNTER — Encounter (HOSPITAL_COMMUNITY): Payer: Self-pay | Admitting: Pharmacist

## 2013-09-27 ENCOUNTER — Ambulatory Visit (HOSPITAL_COMMUNITY)
Admission: RE | Admit: 2013-09-27 | Discharge: 2013-09-27 | Disposition: A | Discharge status: Home / Self Care | Payer: BC Managed Care – PPO | Source: Ambulatory Visit | Attending: Obstetrics and Gynecology | Admitting: Obstetrics and Gynecology

## 2013-09-27 ENCOUNTER — Ambulatory Visit (HOSPITAL_COMMUNITY): Payer: BC Managed Care – PPO | Admitting: Anesthesiology

## 2013-09-27 ENCOUNTER — Encounter (HOSPITAL_COMMUNITY): Payer: Self-pay | Admitting: *Deleted

## 2013-09-27 ENCOUNTER — Encounter (HOSPITAL_COMMUNITY): Payer: BC Managed Care – PPO | Admitting: Anesthesiology

## 2013-09-27 ENCOUNTER — Encounter (HOSPITAL_COMMUNITY)
Admission: RE | Disposition: A | Discharge status: Home / Self Care | Payer: Self-pay | Source: Ambulatory Visit | Attending: Obstetrics and Gynecology

## 2013-09-27 DIAGNOSIS — Z30430 Encounter for insertion of intrauterine contraceptive device: Secondary | ICD-10-CM | POA: Insufficient documentation

## 2013-09-27 DIAGNOSIS — N938 Other specified abnormal uterine and vaginal bleeding: Secondary | ICD-10-CM

## 2013-09-27 DIAGNOSIS — N926 Irregular menstruation, unspecified: Secondary | ICD-10-CM | POA: Insufficient documentation

## 2013-09-27 DIAGNOSIS — N949 Unspecified condition associated with female genital organs and menstrual cycle: Secondary | ICD-10-CM

## 2013-09-27 DIAGNOSIS — N920 Excessive and frequent menstruation with regular cycle: Secondary | ICD-10-CM | POA: Insufficient documentation

## 2013-09-27 HISTORY — PX: HYSTEROSCOPY WITH D & C: SHX1775

## 2013-09-27 LAB — CBC
HCT: 38.7 % (ref 36.0–46.0)
Hemoglobin: 12.8 g/dL (ref 12.0–15.0)
MCH: 28.1 pg (ref 26.0–34.0)
MCHC: 33.1 g/dL (ref 30.0–36.0)
MCV: 84.9 fL (ref 78.0–100.0)
PLATELETS: 269 10*3/uL (ref 150–400)
RBC: 4.56 MIL/uL (ref 3.87–5.11)
RDW: 13.9 % (ref 11.5–15.5)
WBC: 5 10*3/uL (ref 4.0–10.5)

## 2013-09-27 SURGERY — DILATATION AND CURETTAGE /HYSTEROSCOPY
Anesthesia: General

## 2013-09-27 MED ORDER — ROCURONIUM BROMIDE 100 MG/10ML IV SOLN
INTRAVENOUS | Status: AC
  Filled 2013-09-27: qty 1

## 2013-09-27 MED ORDER — FENTANYL CITRATE 0.05 MG/ML IJ SOLN
INTRAMUSCULAR | Status: DC | PRN
  Administered 2013-09-27: 100 ug via INTRAVENOUS

## 2013-09-27 MED ORDER — ONDANSETRON HCL 4 MG/2ML IJ SOLN
INTRAMUSCULAR | Status: AC
  Filled 2013-09-27: qty 2

## 2013-09-27 MED ORDER — LIDOCAINE HCL 1 % IJ SOLN
INTRAMUSCULAR | Status: DC | PRN
  Administered 2013-09-27: 10 mL

## 2013-09-27 MED ORDER — NEOSTIGMINE METHYLSULFATE 10 MG/10ML IV SOLN
INTRAVENOUS | Status: AC
  Filled 2013-09-27: qty 1

## 2013-09-27 MED ORDER — FENTANYL CITRATE 0.05 MG/ML IJ SOLN
INTRAMUSCULAR | Status: AC
  Filled 2013-09-27: qty 2

## 2013-09-27 MED ORDER — LACTATED RINGERS IV SOLN
INTRAVENOUS | Status: DC
  Administered 2013-09-27: 12:00:00 via INTRAVENOUS

## 2013-09-27 MED ORDER — MIDAZOLAM HCL 2 MG/2ML IJ SOLN
INTRAMUSCULAR | Status: AC
  Filled 2013-09-27: qty 2

## 2013-09-27 MED ORDER — FENTANYL CITRATE 0.05 MG/ML IJ SOLN
INTRAMUSCULAR | Status: AC
  Administered 2013-09-27: 50 ug via INTRAVENOUS
  Filled 2013-09-27: qty 2

## 2013-09-27 MED ORDER — IBUPROFEN 600 MG PO TABS: 600.0000 mg | ORAL_TABLET | Freq: Four times a day (QID) | ORAL | Status: DC | PRN

## 2013-09-27 MED ORDER — LIDOCAINE HCL (CARDIAC) 20 MG/ML IV SOLN
INTRAVENOUS | Status: DC | PRN
  Administered 2013-09-27: 80 mg via INTRAVENOUS

## 2013-09-27 MED ORDER — PROPOFOL 10 MG/ML IV BOLUS
INTRAVENOUS | Status: DC | PRN
  Administered 2013-09-27: 200 mg via INTRAVENOUS

## 2013-09-27 MED ORDER — FENTANYL CITRATE 0.05 MG/ML IJ SOLN: 25.0000 ug | INTRAMUSCULAR | Status: DC | PRN

## 2013-09-27 MED ORDER — PROPOFOL 10 MG/ML IV EMUL
INTRAVENOUS | Status: AC
  Filled 2013-09-27: qty 20

## 2013-09-27 MED ORDER — GLYCOPYRROLATE 0.2 MG/ML IJ SOLN
INTRAMUSCULAR | Status: AC
  Filled 2013-09-27: qty 3

## 2013-09-27 MED ORDER — PROMETHAZINE HCL 25 MG/ML IJ SOLN: 6.2500 mg | INTRAMUSCULAR | Status: DC | PRN

## 2013-09-27 MED ORDER — GLYCINE 1.5 % IR SOLN
Status: DC | PRN
  Administered 2013-09-27: 3000 mL

## 2013-09-27 MED ORDER — LIDOCAINE HCL (CARDIAC) 20 MG/ML IV SOLN
INTRAVENOUS | Status: AC
  Filled 2013-09-27: qty 5

## 2013-09-27 MED ORDER — DEXAMETHASONE SODIUM PHOSPHATE 10 MG/ML IJ SOLN
INTRAMUSCULAR | Status: DC | PRN
  Administered 2013-09-27: 10 mg via INTRAVENOUS

## 2013-09-27 MED ORDER — OXYCODONE-ACETAMINOPHEN 5-325 MG PO TABS
ORAL_TABLET | ORAL | Status: AC
  Filled 2013-09-27: qty 1

## 2013-09-27 MED ORDER — KETOROLAC TROMETHAMINE 30 MG/ML IJ SOLN: 15.0000 mg | Freq: Once | INTRAMUSCULAR | Status: AC | PRN

## 2013-09-27 MED ORDER — ONDANSETRON HCL 4 MG/2ML IJ SOLN
INTRAMUSCULAR | Status: DC | PRN
  Administered 2013-09-27: 4 mg via INTRAVENOUS

## 2013-09-27 MED ORDER — LIDOCAINE HCL 1 % IJ SOLN
INTRAMUSCULAR | Status: AC
  Filled 2013-09-27: qty 20

## 2013-09-27 MED ORDER — OXYCODONE-ACETAMINOPHEN 5-325 MG PO TABS: 2.0000 | ORAL_TABLET | ORAL | Status: DC | PRN

## 2013-09-27 MED ORDER — MIDAZOLAM HCL 2 MG/2ML IJ SOLN
INTRAMUSCULAR | Status: DC | PRN
  Administered 2013-09-27: 2 mg via INTRAVENOUS

## 2013-09-27 MED ORDER — KETOROLAC TROMETHAMINE 30 MG/ML IJ SOLN
INTRAMUSCULAR | Status: DC | PRN
  Administered 2013-09-27: 30 mg via INTRAVENOUS

## 2013-09-27 MED ORDER — DEXAMETHASONE SODIUM PHOSPHATE 10 MG/ML IJ SOLN
INTRAMUSCULAR | Status: AC
  Filled 2013-09-27: qty 1

## 2013-09-27 MED ORDER — MEPERIDINE HCL 25 MG/ML IJ SOLN: 6.2500 mg | INTRAMUSCULAR | Status: DC | PRN

## 2013-09-27 MED ORDER — OXYCODONE-ACETAMINOPHEN 5-325 MG PO TABS: 1.0000 | ORAL_TABLET | ORAL | Status: DC | PRN

## 2013-09-27 MED ORDER — MIDAZOLAM HCL 2 MG/2ML IJ SOLN: 0.5000 mg | Freq: Once | INTRAMUSCULAR | Status: AC | PRN

## 2013-09-27 SURGICAL SUPPLY — 21 items
ABLATOR ENDOMETRIAL BIPOLAR (ABLATOR) IMPLANT
CANISTER SUCT 3000ML (MISCELLANEOUS) ×2 IMPLANT
CATH ROBINSON RED A/P 16FR (CATHETERS) ×2 IMPLANT
CLOTH BEACON ORANGE TIMEOUT ST (SAFETY) ×2 IMPLANT
CONTAINER PREFILL 10% NBF 60ML (FORM) ×4 IMPLANT
DILATOR CANAL MILEX (MISCELLANEOUS) IMPLANT
DRAPE HYSTEROSCOPY (DRAPE) ×2 IMPLANT
DRSG TELFA 3X8 NADH (GAUZE/BANDAGES/DRESSINGS) ×2 IMPLANT
ELECT REM PT RETURN 9FT ADLT (ELECTROSURGICAL)
ELECTRODE REM PT RTRN 9FT ADLT (ELECTROSURGICAL) IMPLANT
GLOVE BIO SURGEON STRL SZ7 (GLOVE) ×2 IMPLANT
GOWN STRL REUS W/TWL LRG LVL3 (GOWN DISPOSABLE) ×4 IMPLANT
Mirena (Miscellaneous) ×2 IMPLANT
NEEDLE SPNL 22GX3.5 QUINCKE BK (NEEDLE) ×4 IMPLANT
PACK VAGINAL MINOR WOMEN LF (CUSTOM PROCEDURE TRAY) ×2 IMPLANT
PAD OB MATERNITY 4.3X12.25 (PERSONAL CARE ITEMS) ×2 IMPLANT
SET TUBING HYSTEROSCOPY 2 NDL (TUBING) ×2 IMPLANT
SYR CONTROL 10ML LL (SYRINGE) ×2 IMPLANT
TOWEL OR 17X24 6PK STRL BLUE (TOWEL DISPOSABLE) ×4 IMPLANT
TUBE HYSTEROSCOPY W Y-CONNECT (TUBING) ×2 IMPLANT
WATER STERILE IRR 1000ML POUR (IV SOLUTION) ×2 IMPLANT

## 2013-09-27 NOTE — Discharge Instructions (Signed)
DISCHARGE INSTRUCTIONS: D&C  The following instructions have been prepared to help you care for yourself upon your return home.   MAY TAKE IBUPROFEN (MOTRIN, ADVIL) OR ALEVE AFTER 6:45 PM FOR CRAMPS!!!  Personal hygiene:  Use sanitary pads for vaginal drainage, not tampons.  Shower the day after your procedure.  NO tub baths, pools or Jacuzzis for 2-3 weeks.  Wipe front to back after using the bathroom.  Activity and limitations:  Do NOT drive or operate any equipment for 24 hours. The effects of anesthesia are still present and drowsiness may result.  Do NOT rest in bed all day.  Walking is encouraged.  Walk up and down stairs slowly.  You may resume your normal activity in one to two days or as indicated by your physician.  Sexual activity: NO intercourse for at least 2 weeks after the procedure, or as indicated by your physician.  Diet: Eat a light meal as desired this evening. You may resume your usual diet tomorrow.  Return to work: You may resume your work activities in one to two days or as indicated by your doctor.  What to expect after your surgery: Expect to have vaginal bleeding/discharge for 2-3 days and spotting for up to 10 days. It is not unusual to have soreness for up to 1-2 weeks. You may have a slight burning sensation when you urinate for the first day. Mild cramps may continue for a couple of days. You may have a regular period in 2-6 weeks.  Call your doctor for any of the following:  Excessive vaginal bleeding, saturating and changing one pad every hour.  Inability to urinate 6 hours after discharge from hospital.  Pain not relieved by pain medication.  Fever of 100.4 F or greater.  Unusual vaginal discharge or odor.   Call for an appointment:    Patients signature: ______________________  Nurses signature ________________________  Support person's signature_______________________

## 2013-09-27 NOTE — Transfer of Care (Signed)
Immediate Anesthesia Transfer of Care Note  Patient: Jessica Sanchez  Procedure(s) Performed: Procedure(s): DILATATION AND CURETTAGE /HYSTEROSCOPY and insertion of Mirena IUD  (N/A)  Patient Location: PACU  Anesthesia Type:General  Level of Consciousness: awake, alert  and sedated  Airway & Oxygen Therapy: Patient Spontanous Breathing and Patient connected to nasal cannula oxygen  Post-op Assessment: Report given to PACU RN and Post -op Vital signs reviewed and stable  Post vital signs: Reviewed and stable  Complications: No apparent anesthesia complications

## 2013-09-27 NOTE — Anesthesia Postprocedure Evaluation (Signed)
Anesthesia Post Note  Patient: Jessica Sanchez  Procedure(s) Performed: Procedure(s) (LRB): DILATATION AND CURETTAGE /HYSTEROSCOPY and insertion of Mirena IUD  (N/A)  Anesthesia type: General  Patient location: PACU  Post pain: Pain level controlled  Post assessment: Post-op Vital signs reviewed  Last Vitals:  Filed Vitals:   09/27/13 1330  BP: 111/73  Pulse: 78  Temp:   Resp: 20    Post vital signs: Reviewed  Level of consciousness: sedated  Complications: No apparent anesthesia complications

## 2013-09-27 NOTE — OR Nursing (Signed)
Mirena IUD insertion, expiration Nov. 2017 lot # [id number] 52mg 

## 2013-09-27 NOTE — OR Nursing (Signed)
Mirena IUD insertion  serial #  Expiration:03/2016

## 2013-09-27 NOTE — Anesthesia Procedure Notes (Signed)
Procedure Name: LMA Insertion Date/Time: 09/27/2013 12:06 PM Performed by: Kenady Doxtater, Sheron Nightingale Pre-anesthesia Checklist: Patient identified, Timeout performed, Emergency Drugs available, Suction available and Patient being monitored Patient Re-evaluated:Patient Re-evaluated prior to inductionOxygen Delivery Method: Circle system utilized Preoxygenation: Pre-oxygenation with 100% oxygen Intubation Type: IV induction Ventilation: Mask ventilation without difficulty LMA: LMA inserted LMA Size: 4.0 Number of attempts: 1 Dental Injury: Teeth and Oropharynx as per pre-operative assessment

## 2013-09-27 NOTE — Op Note (Signed)
Pre-Operative Diagnosis: 1) menorrhagia Postoperative Diagnosis: same Procedure: Hysteroscopy, dilation and curettage, intrauterine device insertion Surgeon: Dr. Vanessa Kick Assistant: None Operative Findings: Normal appearing endometrial cavity with attenuated endometrial lining. Friable lining of the endocervix and of the endometrium at the fundus of the uterus. Uterus sounded to 10 cm. Bilateral tubal ostia were noted.. The portion of the cervix  Within the uterus with elongated.  Specimen: endometrial curettings EBL: Minimal  Jessica Sanchez is a 28 year old nulligravida African American female who presents for surgical evaluation of menorrhagia and persistent menstrual bleeding. She has been previously treated with oral contraceptive pills and high-dose progesterone however her irregular bleeding persisted. The decision was made to perform a hysteroscopy D&C to evaluate for possible anatomic causes of your irregular bleeding that could not be elucidated by vaginal ultrasound. Following the appropriate informed consent the patient was brought to the operating room and placed in the dorsal lithotomy position. General anesthesia was administered without difficulty. The patient was prepped and draped in the normal sterile fashion. A speculum was placed into the vagina and a single-tooth tenaculum was placed on the anterior lip of the cervix. A 10 cc of 1% lidocaine paracervical block was administered. The cervix was serially dilated with Kennon Rounds dilators. The hysteroscope was then introduced after the uterus was sounded to 10 cm. The above findings were noted. The hysteroscope was removed and a curettage was performed for minimal tissue. A Mirena intrauterine device was then inserted without difficulty this completed the procedure. The patient's part the procedure well and was brought to the recovery room in stable condition following the procedure.

## 2013-09-27 NOTE — H&P (Signed)
Jessica Sanchez is an 28 y.o. female. Presents for persistent menorrhagia  28 yo presents for surgical management of menorrhagia and persistent irregular bleeding. She has a h/o prior myomectomy in 2012.  When she presented for evaluation and was diagnosed with fibroids in 2012 she presented with persistent irregular bleeding. When she initially presented to me she felt that her fibroids had reoccurred. Her ultrasound was normal but she continues to complain of irregular persistent heavy bleeding with spotting. She presents today for hysteroscopic evaluation, D&C, and insertion of an intrauterine device    Past Medical History  Diagnosis Date  . Fibroid, uterine   . Fibroid (bleeding) (uterine)   . Menorrhagia   . H/O metrorrhagia     Past Surgical History  Procedure Laterality Date  . Tonsillectomy    . Myomectomy  02/03/2011    Procedure: MYOMECTOMY;  Surgeon: [name], MD;  Location: [facility];  Service: Gynecology;  Laterality: N/A;    Family History  Problem Relation Age of Onset  . Anesthesia problems Neg Hx   . Hypotension Neg Hx   . Malignant hyperthermia Neg Hx   . Pseudochol deficiency Neg Hx     Social History:  reports that she has never smoked. She has never used smokeless tobacco. She reports that she does not drink alcohol or use illicit drugs.  Allergies: No Known Allergies  Prescriptions prior to admission  Medication Sig Dispense Refill  . aspirin-acetaminophen-caffeine (EXCEDRIN MIGRAINE) 250-250-65 MG per tablet Take 1 tablet by mouth every 6 (six) hours as needed for headache or migraine.      . Multiple Vitamin (MULTIVITAMIN) capsule Take 1 capsule by mouth daily.        ROS: as above  Blood pressure 127/77, pulse 82, temperature 98.2 F (36.8 C), temperature source Oral, height 5\' 2"  (1.575 m), weight 103.874 kg (229 lb), SpO2 100.00%. Physical Exam  Results for orders placed during the hospital encounter of 09/27/13 (from the past 24 hour(s))  CBC      Status: None   Collection Time    09/27/13 10:45 AM      Result Value Ref Range   WBC 5.0  4.0 - 10.5 K/uL   RBC 4.56  3.87 - 5.11 MIL/uL   Hemoglobin 12.8  12.0 - 15.0 g/dL   HCT 38.7  36.0 - 46.0 %   MCV 84.9  78.0 - 100.0 fL   MCH 28.1  26.0 - 34.0 pg   MCHC 33.1  30.0 - 36.0 g/dL   RDW 13.9  11.5 - 15.5 %   Platelets 269  150 - 400 K/uL    No results found.  Assessment/Plan: 1) Admit 2) Hysteroscopy, Dilation and curettage, and intrauterine device insertion. Risks, benefits, alternatives of procedure were discussed with the patient and she was to proceed  [name]. Royal Vandevoort 09/27/2013, 11:50 AM

## 2013-09-27 NOTE — Anesthesia Preprocedure Evaluation (Signed)
Anesthesia Evaluation  Patient identified by MRN, date of birth, ID band Patient awake    Reviewed: Allergy & Precautions, H&P , Patient's Chart, lab work & pertinent test results, reviewed documented beta blocker date and time   History of Anesthesia Complications Negative for: history of anesthetic complications  Airway Mallampati: III TM Distance: >3 FB Neck ROM: full    Dental   Pulmonary  breath sounds clear to auscultation        Cardiovascular Exercise Tolerance: Good Rhythm:regular Rate:Normal     Neuro/Psych negative psych ROS   GI/Hepatic   Endo/Other  Morbid obesity  Renal/GU      Musculoskeletal   Abdominal   Peds  Hematology   Anesthesia Other Findings   Reproductive/Obstetrics                           Anesthesia Physical Anesthesia Plan  ASA: III  Anesthesia Plan: General LMA   Post-op Pain Management:    Induction:   Airway Management Planned:   Additional Equipment:   Intra-op Plan:   Post-operative Plan:   Informed Consent: I have reviewed the patients History and Physical, chart, labs and discussed the procedure including the risks, benefits and alternatives for the proposed anesthesia with the patient or authorized representative who has indicated his/her understanding and acceptance.   Dental Advisory Given  Plan Discussed with: CRNA, Surgeon and Anesthesiologist  Anesthesia Plan Comments:         Anesthesia Quick Evaluation  

## 2013-09-28 ENCOUNTER — Encounter (HOSPITAL_COMMUNITY): Payer: Self-pay | Admitting: Obstetrics and Gynecology

## 2013-10-06 ENCOUNTER — Emergency Department (HOSPITAL_COMMUNITY)
Admission: EM | Admit: 2013-10-06 | Discharge: 2013-10-07 | Disposition: A | Discharge status: Home / Self Care | Payer: BC Managed Care – PPO | Attending: Emergency Medicine | Admitting: Emergency Medicine

## 2013-10-06 ENCOUNTER — Encounter (HOSPITAL_COMMUNITY): Payer: Self-pay | Admitting: Emergency Medicine

## 2013-10-06 DIAGNOSIS — Z3202 Encounter for pregnancy test, result negative: Secondary | ICD-10-CM | POA: Insufficient documentation

## 2013-10-06 DIAGNOSIS — R000 Tachycardia, unspecified: Secondary | ICD-10-CM | POA: Insufficient documentation

## 2013-10-06 DIAGNOSIS — R0789 Other chest pain: Secondary | ICD-10-CM

## 2013-10-06 DIAGNOSIS — Z8742 Personal history of other diseases of the female genital tract: Secondary | ICD-10-CM | POA: Insufficient documentation

## 2013-10-06 DIAGNOSIS — R55 Syncope and collapse: Secondary | ICD-10-CM | POA: Insufficient documentation

## 2013-10-06 DIAGNOSIS — R071 Chest pain on breathing: Secondary | ICD-10-CM | POA: Insufficient documentation

## 2013-10-06 LAB — URINALYSIS, ROUTINE W REFLEX MICROSCOPIC
BILIRUBIN URINE: NEGATIVE
Glucose, UA: NEGATIVE mg/dL
KETONES UR: NEGATIVE mg/dL
Leukocytes, UA: NEGATIVE
Nitrite: NEGATIVE
Protein, ur: NEGATIVE mg/dL
SPECIFIC GRAVITY, URINE: 1.025 (ref 1.005–1.030)
UROBILINOGEN UA: 0.2 mg/dL (ref 0.0–1.0)
pH: 6.5 (ref 5.0–8.0)

## 2013-10-06 LAB — CBC WITH DIFFERENTIAL/PLATELET
BASOS PCT: 0 % (ref 0–1)
Basophils Absolute: 0 10*3/uL (ref 0.0–0.1)
Eosinophils Absolute: 0.1 10*3/uL (ref 0.0–0.7)
Eosinophils Relative: 1 % (ref 0–5)
HCT: 34.9 % — ABNORMAL LOW (ref 36.0–46.0)
HEMOGLOBIN: 11.7 g/dL — AB (ref 12.0–15.0)
Lymphocytes Relative: 35 % (ref 12–46)
Lymphs Abs: 2.5 10*3/uL (ref 0.7–4.0)
MCH: 28.1 pg (ref 26.0–34.0)
MCHC: 33.5 g/dL (ref 30.0–36.0)
MCV: 83.7 fL (ref 78.0–100.0)
MONOS PCT: 9 % (ref 3–12)
Monocytes Absolute: 0.6 10*3/uL (ref 0.1–1.0)
NEUTROS ABS: 3.8 10*3/uL (ref 1.7–7.7)
NEUTROS PCT: 55 % (ref 43–77)
Platelets: 287 10*3/uL (ref 150–400)
RBC: 4.17 MIL/uL (ref 3.87–5.11)
RDW: 13.9 % (ref 11.5–15.5)
WBC: 7 10*3/uL (ref 4.0–10.5)

## 2013-10-06 LAB — COMPREHENSIVE METABOLIC PANEL
ALBUMIN: 3.8 g/dL (ref 3.5–5.2)
ALK PHOS: 64 U/L (ref 39–117)
ALT: 9 U/L (ref 0–35)
AST: 11 U/L (ref 0–37)
BILIRUBIN TOTAL: 0.2 mg/dL — AB (ref 0.3–1.2)
BUN: 12 mg/dL (ref 6–23)
CO2: 21 mEq/L (ref 19–32)
CREATININE: 0.72 mg/dL (ref 0.50–1.10)
Calcium: 9.3 mg/dL (ref 8.4–10.5)
Chloride: 102 mEq/L (ref 96–112)
GFR calc Af Amer: >90 mL/min (ref >90)
GFR calc non Af Amer: >90 mL/min (ref >90)
GLUCOSE: 112 mg/dL — AB (ref 70–99)
POTASSIUM: 3.9 meq/L (ref 3.7–5.3)
Sodium: 136 mEq/L — ABNORMAL LOW (ref 137–147)
Total Protein: 7 g/dL (ref 6.0–8.3)

## 2013-10-06 LAB — PREGNANCY, URINE: Preg Test, Ur: NEGATIVE

## 2013-10-06 LAB — URINE MICROSCOPIC-ADD ON

## 2013-10-06 LAB — TROPONIN I

## 2013-10-06 MED ORDER — KETOROLAC TROMETHAMINE 30 MG/ML IJ SOLN
30.0000 mg | Freq: Once | INTRAMUSCULAR | Status: AC
Start: 2013-10-06 — End: 2013-10-07
  Administered 2013-10-07: 30 mg via INTRAVENOUS
  Filled 2013-10-06: qty 1

## 2013-10-06 MED ORDER — SODIUM CHLORIDE 0.9 % IV SOLN
1000.0000 mL | INTRAVENOUS | Status: DC
  Administered 2013-10-07: 1000 mL via INTRAVENOUS

## 2013-10-06 MED ORDER — SODIUM CHLORIDE 0.9 % IV SOLN
1000.0000 mL | Freq: Once | INTRAVENOUS | Status: AC
  Administered 2013-10-07: 1000 mL via INTRAVENOUS

## 2013-10-06 NOTE — ED Provider Notes (Signed)
CSN: [id number]     Arrival date & time 10/06/13  2122 History   This chart was scribed for Jessica Fuel, MD by Steva Colder, ED Scribe. The patient was seen in room [facility] at 11:24 PM.   Chief Complaint  Patient presents with  . Tremors   The history is provided by the patient. No language interpreter was used.   HPI Comments: Jessica Sanchez is a 28 y.o. female who presents to the Emergency Department complaining of tremors with an associated symptom of sharp, 8/10, chest pain onset 3 hours ago. She states that she remembers speaking with a relative around 8:00 PM before she passed out. Pt states that she had chest pain when she came to at 8:30 PM after her syncopal episode at 8:15 PM. Pt also reports dizziness, shaking, and light headedness after the episode as well. She states that currently she isn't having any other pain other than the chest pain and denies current tremors. She states that her CP is not affected with movement. She states that she hasn't taken anything to try and alleviate the CP. She denies nausea, diaphoresis, vomiting, and dizziness. She states that she doesn't smoke or drink. Pt states that she had an IUD inserted last week. She states that she doesn't have any recent travel that required her to sit for long hours.   PCP- Maricela Curet, MD  Past Medical History  Diagnosis Date  . Fibroid, uterine   . Fibroid (bleeding) (uterine)   . Menorrhagia   . H/O metrorrhagia    Past Surgical History  Procedure Laterality Date  . Tonsillectomy    . Myomectomy  02/03/2011    Procedure: MYOMECTOMY;  Surgeon: [name], MD;  Location: [facility];  Service: Gynecology;  Laterality: N/A;  . Hysteroscopy w/d&c N/A 09/27/2013    Procedure: DILATATION AND CURETTAGE /HYSTEROSCOPY and insertion of Mirena IUD ;  Surgeon: [name]. Harrington Challenger, MD;  Location: [facility];  Service: Gynecology;  Laterality: N/A;   Family History  Problem Relation Age of Onset  . Anesthesia problems Neg Hx    . Hypotension Neg Hx   . Malignant hyperthermia Neg Hx   . Pseudochol deficiency Neg Hx    History  Substance Use Topics  . Smoking status: Never Smoker   . Smokeless tobacco: Never Used  . Alcohol Use: No   OB History   Grav Para Term Preterm Abortions TAB SAB Ect Mult Living   1              Review of Systems  All other systems reviewed and are negative.  Allergies  Review of patient's allergies indicates no known allergies.  Home Medications   Prior to Admission medications   Medication Sig Start Date End Date Taking? Authorizing Provider  aspirin-acetaminophen-caffeine (EXCEDRIN MIGRAINE) [phone] MG per tablet Take 1 tablet by mouth every 6 (six) hours as needed for headache or migraine.   Yes Historical Provider, MD  ibuprofen (ADVIL,MOTRIN) 600 MG tablet Take 1 tablet (600 mg total) by mouth every 6 (six) hours as needed. 09/27/13  Yes Kendra H. Harrington Challenger, MD  Multiple Vitamin (MULTIVITAMIN) capsule Take 1 capsule by mouth daily.   Yes Historical Provider, MD   BP 129/79  Pulse 112  Temp(Src) 98.7 F (37.1 C) (Oral)  Resp 20  Ht 5\' 2"  (1.575 m)  Wt 229 lb (103.874 kg)  BMI 41.87 kg/m2  SpO2 100%  LMP 10/01/2013 Physical Exam  Nursing note and vitals reviewed. Constitutional: She  is oriented to person, place, and time. She appears well-developed and well-nourished. No distress.  HENT:  Head: Normocephalic and atraumatic.  Eyes: Conjunctivae and EOM are normal. Pupils are equal, round, and reactive to light.  Neck: Normal range of motion and phonation normal. Neck supple. No JVD present. No tracheal deviation present.  Cardiovascular: Regular rhythm, normal heart sounds and intact distal pulses.  Tachycardia present.   No murmur heard. Pulmonary/Chest: Effort normal and breath sounds normal. No respiratory distress. She has no wheezes. She has no rales. She exhibits tenderness (Chest moderate tenderness over left lower parasternal region reproduced by palpation.).   Abdominal: Soft. Bowel sounds are normal. She exhibits no distension. There is no tenderness. There is no guarding.  Musculoskeletal: Normal range of motion. She exhibits no edema.  Lymphadenopathy:    She has no cervical adenopathy.  Neurological: She is alert and oriented to person, place, and time. She has normal reflexes. No cranial nerve deficit. She exhibits normal muscle tone. Coordination normal.  Skin: Skin is warm and dry. No rash noted.  Psychiatric: She has a normal mood and affect. Her behavior is normal. Judgment and thought content normal.    ED Course  Procedures (including critical care time) DIAGNOSTIC STUDIES: Oxygen Saturation is 100% on room air, normal by my interpretation.    COORDINATION OF CARE: 11:31 PM-Discussed treatment plan with pt at bedside and pt agreed to plan.   Medications  0.9 %  sodium chloride infusion (1,000 mLs Intravenous New Bag/Given 10/07/13 0013)    Followed by  0.9 %  sodium chloride infusion (not administered)  ketorolac (TORADOL) 30 MG/ML injection 30 mg (30 mg Intravenous Given 10/07/13 0013)    Results for orders placed during the hospital encounter of 10/06/13  CBC WITH DIFFERENTIAL      Result Value Ref Range   WBC 7.0  4.0 - 10.5 K/uL   RBC 4.17  3.87 - 5.11 MIL/uL   Hemoglobin 11.7 (*) 12.0 - 15.0 g/dL   HCT 34.9 (*) 36.0 - 46.0 %   MCV 83.7  78.0 - 100.0 fL   MCH 28.1  26.0 - 34.0 pg   MCHC 33.5  30.0 - 36.0 g/dL   RDW 13.9  11.5 - 15.5 %   Platelets 287  150 - 400 K/uL   Neutrophils Relative % 55  43 - 77 %   Neutro Abs 3.8  1.7 - 7.7 K/uL   Lymphocytes Relative 35  12 - 46 %   Lymphs Abs 2.5  0.7 - 4.0 K/uL   Monocytes Relative 9  3 - 12 %   Monocytes Absolute 0.6  0.1 - 1.0 K/uL   Eosinophils Relative 1  0 - 5 %   Eosinophils Absolute 0.1  0.0 - 0.7 K/uL   Basophils Relative 0  0 - 1 %   Basophils Absolute 0.0  0.0 - 0.1 K/uL  COMPREHENSIVE METABOLIC PANEL      Result Value Ref Range   Sodium 136 (*) 137 - 147  mEq/L   Potassium 3.9  3.7 - 5.3 mEq/L   Chloride 102  96 - 112 mEq/L   CO2 21  19 - 32 mEq/L   Glucose, Bld 112 (*) 70 - 99 mg/dL   BUN 12  6 - 23 mg/dL   Creatinine, Ser 0.72  0.50 - 1.10 mg/dL   Calcium 9.3  8.4 - 10.5 mg/dL   Total Protein 7.0  6.0 - 8.3 g/dL   Albumin 3.8  3.5 -  5.2 g/dL   AST 11  0 - 37 U/L   ALT 9  0 - 35 U/L   Alkaline Phosphatase 64  39 - 117 U/L   Total Bilirubin 0.2 (*) 0.3 - 1.2 mg/dL   GFR calc non Af Amer >90  >90 mL/min   GFR calc Af Amer >90  >90 mL/min  TROPONIN I      Result Value Ref Range   Troponin I <0.30  <0.30 ng/mL  PREGNANCY, URINE      Result Value Ref Range   Preg Test, Ur NEGATIVE  NEGATIVE  URINALYSIS, ROUTINE W REFLEX MICROSCOPIC      Result Value Ref Range   Color, Urine YELLOW  YELLOW   APPearance CLEAR  CLEAR   Specific Gravity, Urine 1.025  1.005 - 1.030   pH 6.5  5.0 - 8.0   Glucose, UA NEGATIVE  NEGATIVE mg/dL   Hgb urine dipstick MODERATE (*) NEGATIVE   Bilirubin Urine NEGATIVE  NEGATIVE   Ketones, ur NEGATIVE  NEGATIVE mg/dL   Protein, ur NEGATIVE  NEGATIVE mg/dL   Urobilinogen, UA 0.2  0.0 - 1.0 mg/dL   Nitrite NEGATIVE  NEGATIVE   Leukocytes, UA NEGATIVE  NEGATIVE  URINE MICROSCOPIC-ADD ON      Result Value Ref Range   RBC / HPF 3-6  <3 RBC/hpf  LACTIC ACID, PLASMA      Result Value Ref Range   Lactic Acid, Venous 1.0  0.5 - 2.2 mmol/L  D-DIMER, QUANTITATIVE      Result Value Ref Range   D-Dimer, Quant 0.43  0.00 - 0.48 ug/mL-FEU    EKG Interpretation   Date/Time:  Saturday October 06 2013 21:31:23 EDT Ventricular Rate:  108 PR Interval:  87 QRS Duration: 90 QT Interval:  475 QTC Calculation: 637 R Axis:   43 Text Interpretation:  Sinus tachycardia Nonspecific T abnrm, anterolateral  leads Artifact in lead(s) I II aVR aVL When compared with ECG of 12/07/2010,  No significant change was found Confirmed by [facility]  MD, Sherina Stammer ([id number]) on  10/07/2013 12:04:27 AM       EKG Interpretation   Date/Time:  Sunday  October 07 2013 01:28:38 EDT Ventricular Rate:  84 PR Interval:  131 QRS Duration: 89 QT Interval:  370 QTC Calculation: 437 R Axis:   32 Text Interpretation:  Sinus rhythm Nonspecific T abnormalities, anterior  leads No significant change since last tracing Confirmed by [facility]  MD,  Kiel Cockerell ([id number]) on 10/07/2013 1:31:53 AM       MDM   Final diagnoses:  Chest wall pain  Syncope    Syncope of uncertain cause. His pain appears to be a chest wall pain. Initial laboratory workup is unremarkable including normal troponin. Hemoglobin is noted to be slightly low and sodium borderline low. She is noted to be mildly tachycardic at its exit d-dimer is obtained which is also normal. Orthostatic vital signs are normal. Computer reading on initial ECG stated that there was a prolonged QT interval however I believe is at the computer is misinterpreting baseline artifact and actual QT interval is normal. ECG was repeated and she was no longer anxious and QT interval is indeed normal. She will be discharged with a prescription for naproxen.  I personally performed the services described in this documentation, which was scribed in my presence. The recorded information has been reviewed and is accurate.     Jessica Fuel, MD 28/36/62 [id number]

## 2013-10-06 NOTE — ED Notes (Signed)
"  I don't know what happened, I feel shaky"

## 2013-10-06 NOTE — ED Notes (Signed)
Family at bedside. 

## 2013-10-07 LAB — D-DIMER, QUANTITATIVE: D-Dimer, Quant: 0.43 ug/mL-FEU (ref 0.00–0.48)

## 2013-10-07 LAB — LACTIC ACID, PLASMA: Lactic Acid, Venous: 1 mmol/L (ref 0.5–2.2)

## 2013-10-07 MED ORDER — NAPROXEN 500 MG PO TABS
500.0000 mg | ORAL_TABLET | Freq: Two times a day (BID) | ORAL | Status: DC
Start: 2013-10-07 — End: 2013-10-07

## 2013-10-07 MED ORDER — NAPROXEN 500 MG PO TABS: 500.0000 mg | ORAL_TABLET | Freq: Two times a day (BID) | ORAL | Status: DC

## 2013-10-07 NOTE — Discharge Instructions (Signed)
Chest Wall Pain Chest wall pain is pain in or around the bones and muscles of your chest. It may take up to 6 weeks to get better. It may take longer if you must stay physically active in your work and activities.  CAUSES  Chest wall pain may happen on its own. However, it may be caused by:  A viral illness like the flu.  Injury.  Coughing.  Exercise.  Arthritis.  Fibromyalgia.  Shingles. HOME CARE INSTRUCTIONS   Avoid overtiring physical activity. Try not to strain or perform activities that cause pain. This includes any activities using your chest or your abdominal and side muscles, especially if heavy weights are used.  Put ice on the sore area.  Put ice in a plastic bag.  Place a towel between your skin and the bag.  Leave the ice on for 15-20 minutes per hour while awake for the first 2 days.  Only take over-the-counter or prescription medicines for pain, discomfort, or fever as directed by your caregiver. SEEK IMMEDIATE MEDICAL CARE IF:   Your pain increases, or you are very uncomfortable.  You have a fever.  Your chest pain becomes worse.  You have new, unexplained symptoms.  You have nausea or vomiting.  You feel sweaty or lightheaded.  You have a cough with phlegm (sputum), or you cough up blood. MAKE SURE YOU:   Understand these instructions.  Will watch your condition.  Will get help right away if you are not doing well or get worse. Document Released: 04/19/2005 Document Revised: 07/12/2011 Document Reviewed: 12/14/2010 [facility] Patient Information 2014 [place], [place].  Syncope Syncope is a fainting spell. This means the person loses consciousness and drops to the ground. The person is generally unconscious for less than 5 minutes. The person may have some muscle twitches for up to 15 seconds before waking up and returning to normal. Syncope occurs more often in elderly people, but it can happen to anyone. While most causes of syncope are not  dangerous, syncope can be a sign of a serious medical problem. It is important to seek medical care.  CAUSES  Syncope is caused by a sudden decrease in blood flow to the brain. The specific cause is often not determined. Factors that can trigger syncope include:  Taking medicines that lower blood pressure.  Sudden changes in posture, such as standing up suddenly.  Taking more medicine than prescribed.  Standing in one place for too long.  Seizure disorders.  Dehydration and excessive exposure to heat.  Low blood sugar (hypoglycemia).  Straining to have a bowel movement.  Heart disease, irregular heartbeat, or other circulatory problems.  Fear, emotional distress, seeing blood, or severe pain. SYMPTOMS  Right before fainting, you may:  Feel dizzy or lightheaded.  Feel nauseous.  See all white or all black in your field of vision.  Have cold, clammy skin. DIAGNOSIS  Your caregiver will ask about your symptoms, perform a physical exam, and perform electrocardiography (ECG) to record the electrical activity of your heart. Your caregiver may also perform other heart or blood tests to determine the cause of your syncope. TREATMENT  In most cases, no treatment is needed. Depending on the cause of your syncope, your caregiver may recommend changing or stopping some of your medicines. HOME CARE INSTRUCTIONS  Have someone stay with you until you feel stable.  Do not drive, operate machinery, or play sports until your caregiver says it is okay.  Keep all follow-up appointments as directed by your  caregiver.  Lie down right away if you start feeling like you might faint. Breathe deeply and steadily. Wait until all the symptoms have passed.  Drink enough fluids to keep your urine clear or pale yellow.  If you are taking blood pressure or heart medicine, get up slowly, taking several minutes to sit and then stand. This can reduce dizziness. SEEK IMMEDIATE MEDICAL CARE IF:   You  have a severe headache.  You have unusual pain in the chest, abdomen, or back.  You are bleeding from the mouth or rectum, or you have black or tarry stool.  You have an irregular or very fast heartbeat.  You have pain with breathing.  You have repeated fainting or seizure-like jerking during an episode.  You faint when sitting or lying down.  You have confusion.  You have difficulty walking.  You have severe weakness.  You have vision problems. If you fainted, call your local emergency services (911 in [place] Do not drive yourself to the hospital.  MAKE SURE YOU:  Understand these instructions.  Will watch your condition.  Will get help right away if you are not doing well or get worse. Document Released: 04/19/2005 Document Revised: 10/19/2011 Document Reviewed: 06/18/2011 [facility] Patient Information 2014 [place].  Naproxen and naproxen sodium oral immediate-release tablets What is this medicine? NAPROXEN (na PROX en) is a non-steroidal anti-inflammatory drug (NSAID). It is used to reduce swelling and to treat pain. This medicine may be used for dental pain, headache, or painful monthly periods. It is also used for painful joint and muscular problems such as arthritis, tendinitis, bursitis, and gout. This medicine may be used for other purposes; ask your health care provider or pharmacist if you have questions. COMMON BRAND NAME(S): Aflaxen, Aleve Arthritis, Aleve, All Day Relief, Anaprox DS, Anaprox, Naprosyn What should I tell my health care provider before I take this medicine? They need to know if you have any of these conditions: -asthma -cigarette smoker -drink more than 3 alcohol containing drinks a day -heart disease or circulation problems such as heart failure or leg edema (fluid retention) -high blood pressure -kidney disease -liver disease -stomach bleeding or ulcers -an unusual or allergic reaction to naproxen, aspirin, other NSAIDs, other  medicines, foods, dyes, or preservatives -pregnant or trying to get pregnant -breast-feeding How should I use this medicine? Take this medicine by mouth with a glass of water. Follow the directions on the prescription label. Take it with food if your stomach gets upset. Try to not lie down for at least 10 minutes after you take it. Take your medicine at regular intervals. Do not take your medicine more often than directed. Long-term, continuous use may increase the risk of heart attack or stroke. A special MedGuide will be given to you by the pharmacist with each prescription and refill. Be sure to read this information carefully each time. Talk to your pediatrician regarding the use of this medicine in children. Special care may be needed. Overdosage: If you think you have taken too much of this medicine contact a poison control center or emergency room at once. NOTE: This medicine is only for you. Do not share this medicine with others. What if I miss a dose? If you miss a dose, take it as soon as you can. If it is almost time for your next dose, take only that dose. Do not take double or extra doses. What may interact with this medicine? -alcohol -aspirin -cidofovir -diuretics -lithium -methotrexate -other drugs for  inflammation like ketorolac or prednisone -pemetrexed -probenecid -warfarin This list may not describe all possible interactions. Give your health care provider a list of all the medicines, herbs, non-prescription drugs, or dietary supplements you use. Also tell them if you smoke, drink alcohol, or use illegal drugs. Some items may interact with your medicine. What should I watch for while using this medicine? Tell your doctor or health care professional if your pain does not get better. Talk to your doctor before taking another medicine for pain. Do not treat yourself. This medicine does not prevent heart attack or stroke. In fact, this medicine may increase the chance of a  heart attack or stroke. The chance may increase with longer use of this medicine and in people who have heart disease. If you take aspirin to prevent heart attack or stroke, talk with your doctor or health care professional. Do not take other medicines that contain aspirin, ibuprofen, or naproxen with this medicine. Side effects such as stomach upset, nausea, or ulcers may be more likely to occur. Many medicines available without a prescription should not be taken with this medicine. This medicine can cause ulcers and bleeding in the stomach and intestines at any time during treatment. Do not smoke cigarettes or drink alcohol. These increase irritation to your stomach and can make it more susceptible to damage from this medicine. Ulcers and bleeding can happen without warning symptoms and can cause death. You may get drowsy or dizzy. Do not drive, use machinery, or do anything that needs mental alertness until you know how this medicine affects you. Do not stand or sit up quickly, especially if you are an older patient. This reduces the risk of dizzy or fainting spells. This medicine can cause you to bleed more easily. Try to avoid damage to your teeth and gums when you brush or floss your teeth. What side effects may I notice from receiving this medicine? Side effects that you should report to your doctor or health care professional as soon as possible: -black or bloody stools, blood in the urine or vomit -blurred vision -chest pain -difficulty breathing or wheezing -nausea or vomiting -severe stomach pain -skin rash, skin redness, blistering or peeling skin, hives, or itching -slurred speech or weakness on one side of the body -swelling of eyelids, throat, lips -unexplained weight gain or swelling -unusually weak or tired -yellowing of eyes or skin Side effects that usually do not require medical attention (report to your doctor or health care professional if they continue or are  bothersome): -constipation -headache -heartburn This list may not describe all possible side effects. Call your doctor for medical advice about side effects. You may report side effects to FDA at 1-800-FDA-1088. Where should I keep my medicine? Keep out of the reach of children. Store at room temperature between 15 and 30 degrees C (59 and 86 degrees F). Keep container tightly closed. Throw away any unused medicine after the expiration date. NOTE: This sheet is a summary. It may not cover all possible information. If you have questions about this medicine, talk to your doctor, pharmacist, or health care provider.  2014, [employer]. (2009-04-21 20:10:16)

## 2014-03-04 ENCOUNTER — Encounter (HOSPITAL_COMMUNITY): Payer: Self-pay | Admitting: Emergency Medicine

## 2014-03-15 ENCOUNTER — Emergency Department (HOSPITAL_COMMUNITY)
Admission: EM | Admit: 2014-03-15 | Discharge: 2014-03-15 | Disposition: A | Discharge status: Home / Self Care | Payer: BC Managed Care – PPO | Attending: Emergency Medicine | Admitting: Emergency Medicine

## 2014-03-15 ENCOUNTER — Encounter (HOSPITAL_COMMUNITY): Payer: Self-pay | Admitting: Emergency Medicine

## 2014-03-15 DIAGNOSIS — Z3202 Encounter for pregnancy test, result negative: Secondary | ICD-10-CM | POA: Diagnosis not present

## 2014-03-15 DIAGNOSIS — Z9889 Other specified postprocedural states: Secondary | ICD-10-CM | POA: Insufficient documentation

## 2014-03-15 DIAGNOSIS — N760 Acute vaginitis: Secondary | ICD-10-CM | POA: Insufficient documentation

## 2014-03-15 DIAGNOSIS — Z8742 Personal history of other diseases of the female genital tract: Secondary | ICD-10-CM | POA: Insufficient documentation

## 2014-03-15 DIAGNOSIS — B9689 Other specified bacterial agents as the cause of diseases classified elsewhere: Secondary | ICD-10-CM

## 2014-03-15 DIAGNOSIS — R109 Unspecified abdominal pain: Secondary | ICD-10-CM | POA: Diagnosis present

## 2014-03-15 DIAGNOSIS — Z791 Long term (current) use of non-steroidal anti-inflammatories (NSAID): Secondary | ICD-10-CM | POA: Insufficient documentation

## 2014-03-15 LAB — WET PREP, GENITAL
Trich, Wet Prep: NONE SEEN
WBC, Wet Prep HPF POC: NONE SEEN
YEAST WET PREP: NONE SEEN

## 2014-03-15 LAB — COMPREHENSIVE METABOLIC PANEL
ALK PHOS: 76 U/L (ref 39–117)
ALT: 11 U/L (ref 0–35)
ANION GAP: 9 (ref 5–15)
AST: 11 U/L (ref 0–37)
Albumin: 3.6 g/dL (ref 3.5–5.2)
BUN: 11 mg/dL (ref 6–23)
CO2: 26 mEq/L (ref 19–32)
CREATININE: 0.78 mg/dL (ref 0.50–1.10)
Calcium: 9 mg/dL (ref 8.4–10.5)
Chloride: 104 mEq/L (ref 96–112)
GFR calc non Af Amer: >90 mL/min (ref >90)
GLUCOSE: 98 mg/dL (ref 70–99)
POTASSIUM: 4 meq/L (ref 3.7–5.3)
Sodium: 139 mEq/L (ref 137–147)
TOTAL PROTEIN: 7 g/dL (ref 6.0–8.3)
Total Bilirubin: 0.4 mg/dL (ref 0.3–1.2)

## 2014-03-15 LAB — CBC WITH DIFFERENTIAL/PLATELET
Basophils Absolute: 0 10*3/uL (ref 0.0–0.1)
Basophils Relative: 1 % (ref 0–1)
EOS ABS: 0.1 10*3/uL (ref 0.0–0.7)
EOS PCT: 1 % (ref 0–5)
HEMATOCRIT: 38.1 % (ref 36.0–46.0)
HEMOGLOBIN: 12.8 g/dL (ref 12.0–15.0)
LYMPHS ABS: 1.9 10*3/uL (ref 0.7–4.0)
Lymphocytes Relative: 36 % (ref 12–46)
MCH: 28.6 pg (ref 26.0–34.0)
MCHC: 33.6 g/dL (ref 30.0–36.0)
MCV: 85 fL (ref 78.0–100.0)
MONO ABS: 0.4 10*3/uL (ref 0.1–1.0)
Monocytes Relative: 8 % (ref 3–12)
Neutro Abs: 2.8 10*3/uL (ref 1.7–7.7)
Neutrophils Relative %: 54 % (ref 43–77)
Platelets: 281 10*3/uL (ref 150–400)
RBC: 4.48 MIL/uL (ref 3.87–5.11)
RDW: 14 % (ref 11.5–15.5)
WBC: 5.1 10*3/uL (ref 4.0–10.5)

## 2014-03-15 LAB — PREGNANCY, URINE: Preg Test, Ur: NEGATIVE

## 2014-03-15 LAB — URINE MICROSCOPIC-ADD ON

## 2014-03-15 LAB — URINALYSIS, ROUTINE W REFLEX MICROSCOPIC
Bilirubin Urine: NEGATIVE
Glucose, UA: NEGATIVE mg/dL
Ketones, ur: NEGATIVE mg/dL
Leukocytes, UA: NEGATIVE
Nitrite: NEGATIVE
PROTEIN: NEGATIVE mg/dL
Specific Gravity, Urine: 1.025 (ref 1.005–1.030)
UROBILINOGEN UA: 0.2 mg/dL (ref 0.0–1.0)
pH: 7.5 (ref 5.0–8.0)

## 2014-03-15 MED ORDER — IBUPROFEN 800 MG PO TABS: 800.0000 mg | ORAL_TABLET | Freq: Three times a day (TID) | ORAL | Status: DC | PRN

## 2014-03-15 MED ORDER — IBUPROFEN 800 MG PO TABS
800.0000 mg | ORAL_TABLET | Freq: Once | ORAL | Status: AC
  Administered 2014-03-15: 800 mg via ORAL
  Filled 2014-03-15: qty 1

## 2014-03-15 MED ORDER — HYDROCODONE-ACETAMINOPHEN 5-325 MG PO TABS
2.0000 | ORAL_TABLET | Freq: Once | ORAL | Status: AC
Start: 2014-03-15 — End: 2014-03-15
  Administered 2014-03-15: 2 via ORAL
  Filled 2014-03-15: qty 2

## 2014-03-15 MED ORDER — METRONIDAZOLE 500 MG PO TABS: 500.0000 mg | ORAL_TABLET | Freq: Two times a day (BID) | ORAL | Status: DC

## 2014-03-15 NOTE — ED Provider Notes (Signed)
TIME SEEN: 1:22 PM  CHIEF COMPLAINT: lower abdominal pain for the past 2 days  HPI: Pt is a 28 y.o. G0 P0 who presents emergency department with lower abdominal pain for the past 3 days. She describes the pain as a pressure and states she has had this pain intermittently since she had a Mirena IUD placed in a 2015 by Dr. Vanessa Kick at [facility]. She states her pain has become increasingly constant since yesterday. She has had intermittent vaginal bleeding but none today. She also complains of vaginal discharge. She is sexually active with one partner. No history of prior STDs. Denies fevers, chills, nausea, vomiting or diarrhea, dysuria or hematuria. She has had prior myomectomy in 2012 as well as a D&C in 2015 with placement of her IUD.  Reports her last bowel movement was 2-3 days ago and was normal.  ROS: See HPI Constitutional: no fever  Eyes: no drainage  ENT: no runny nose   Cardiovascular:  no chest pain  Resp: no SOB  GI: no vomiting GU: no dysuria Integumentary: no rash  Allergy: no hives  Musculoskeletal: no leg swelling  Neurological: no slurred speech ROS otherwise negative  PAST MEDICAL HISTORY/PAST SURGICAL HISTORY:  Past Medical History  Diagnosis Date  . Fibroid, uterine   . Fibroid (bleeding) (uterine)   . Menorrhagia   . H/O metrorrhagia     MEDICATIONS:  Prior to Admission medications   Medication Sig Start Date End Date Taking? Authorizing Provider  aspirin-acetaminophen-caffeine (EXCEDRIN MIGRAINE) [phone] MG per tablet Take 1 tablet by mouth every 6 (six) hours as needed for headache or migraine.    Historical Provider, MD  ibuprofen (ADVIL,MOTRIN) 600 MG tablet Take 1 tablet (600 mg total) by mouth every 6 (six) hours as needed. 09/27/13   Kendra H. Harrington Challenger, MD  Multiple Vitamin (MULTIVITAMIN) capsule Take 1 capsule by mouth daily.    Historical Provider, MD  naproxen (NAPROSYN) 500 MG tablet Take 1 tablet (500 mg total) by mouth 2 (two) times  daily. 07/01/52   Delora Fuel, MD    ALLERGIES:  No Known Allergies  SOCIAL HISTORY:  History  Substance Use Topics  . Smoking status: Never Smoker   . Smokeless tobacco: Never Used  . Alcohol Use: No    FAMILY HISTORY: Family History  Problem Relation Age of Onset  . Anesthesia problems Neg Hx   . Hypotension Neg Hx   . Malignant hyperthermia Neg Hx   . Pseudochol deficiency Neg Hx     EXAM: BP 112/70 mmHg  Pulse 76  Temp(Src) 98.4 F (36.9 C) (Oral)  Resp 18  Ht 5\' 2"  (1.575 m)  Wt 224 lb (101.606 kg)  BMI 40.96 kg/m2  SpO2 100% CONSTITUTIONAL: Alert and oriented and responds appropriately to questions. Well-appearing; well-nourished HEAD: Normocephalic EYES: Conjunctivae clear, PERRL ENT: normal nose; no rhinorrhea; moist mucous membranes; pharynx without lesions noted NECK: Supple, no meningismus, no LAD  CARD: RRR; S1 and S2 appreciated; no murmurs, no clicks, no rubs, no gallops RESP: Normal chest excursion without splinting or tachypnea; breath sounds clear and equal bilaterally; no wheezes, no rhonchi, no rales,  ABD/GI: Normal bowel sounds; non-distended; soft, mild tenderness to palpation over the suprapubic area without guarding or rebound, no peritoneal signs; no tenderness at McBurney's point GU:  Normal external genitalia, no vaginal bleeding, strings from IUD noted coming out of the cervical os, no adnexal tenderness or fullness, no cervical motion tenderness, minimal amount of thick white vaginal discharge BACK:  The back appears normal and is non-tender to palpation, there is no CVA tenderness EXT: Normal ROM in all joints; non-tender to palpation; no edema; normal capillary refill; no cyanosis    SKIN: Normal color for age and race; warm NEURO: Moves all extremities equally PSYCH: The patient's mood and manner are appropriate. Grooming and personal hygiene are appropriate.  MEDICAL DECISION MAKING: Pt here with intermittent abdominal pain since her IUD  was placed in May 2015. Her abdominal exam is relatively benign. Doubt uterine rupture. I am able to visualize the strings of her IUD on her pelvic exam coming out of her cervical os. She has no adnexal tenderness decidua torsion, TOA. Pregnancy test is negative. No cervical motion tenderness on exam to suggest PID. No prior history of STDs. Urine does show hemoglobin and bacteria but also squamous cells. No other sign of infection. Wet prep, gonorrhea and chlamydia swabs pending. Given her benign exam, this time I do not feel she needs emergent abdominal imaging but will have her follow-up with her OB/GYN.  ED PROGRESS: Wet prep is positive for clue cells. We'll treat with Flagyl twice a day for 1 week. I feel she is safe to be discharged home. We'll discharge with prescription for ibuprofen, Flagyl. Discussed return precautions. Patient verbalizes understanding and is comfortable with plan.     [place], DO 03/15/14 1539

## 2014-03-15 NOTE — ED Notes (Signed)
Pt reports lower abdominal pain since last week. Pt denies any n/v/d.

## 2014-03-15 NOTE — Discharge Instructions (Signed)
Bacterial Vaginosis Bacterial vaginosis is a vaginal infection that occurs when the normal balance of bacteria in the vagina is disrupted. It results from an overgrowth of certain bacteria. This is the most common vaginal infection in women of childbearing age. Treatment is important to prevent complications, especially in pregnant women, as it can cause a premature delivery. CAUSES  Bacterial vaginosis is caused by an increase in harmful bacteria that are normally present in smaller amounts in the vagina. Several different kinds of bacteria can cause bacterial vaginosis. However, the reason that the condition develops is not fully understood. RISK FACTORS Certain activities or behaviors can put you at an increased risk of developing bacterial vaginosis, including:  Having a new sex partner or multiple sex partners.  Douching.  Using an intrauterine device (IUD) for contraception. Women do not get bacterial vaginosis from toilet seats, bedding, swimming pools, or contact with objects around them. SIGNS AND SYMPTOMS  Some women with bacterial vaginosis have no signs or symptoms. Common symptoms include:  Grey vaginal discharge.  A fishlike odor with discharge, especially after sexual intercourse.  Itching or burning of the vagina and vulva.  Burning or pain with urination. DIAGNOSIS  Your health care provider will take a medical history and examine the vagina for signs of bacterial vaginosis. A sample of vaginal fluid may be taken. Your health care provider will look at this sample under a microscope to check for bacteria and abnormal cells. A vaginal pH test may also be done.  TREATMENT  Bacterial vaginosis may be treated with antibiotic medicines. These may be given in the form of a pill or a vaginal cream. A second round of antibiotics may be prescribed if the condition comes back after treatment.  HOME CARE INSTRUCTIONS   Only take over-the-counter or prescription medicines as  directed by your health care provider.  If antibiotic medicine was prescribed, take it as directed. Make sure you finish it even if you start to feel better.  Do not have sex until treatment is completed.  Tell all sexual partners that you have a vaginal infection. They should see their health care provider and be treated if they have problems, such as a mild rash or itching.  Practice safe sex by using condoms and only having one sex partner. SEEK MEDICAL CARE IF:   Your symptoms are not improving after 3 days of treatment.  You have increased discharge or pain.  You have a fever. MAKE SURE YOU:   Understand these instructions.  Will watch your condition.  Will get help right away if you are not doing well or get worse. FOR MORE INFORMATION  Centers for Disease Control and Prevention, Division of STD Prevention: www.cdc.gov/std American Sexual Health Association (ASHA): www.ashastd.org  Document Released: 04/19/2005 Document Revised: 02/07/2013 Document Reviewed: 11/29/2012 Exit[employer] Patient Information 2015 [employer]. This information is not intended to replace advice given to you by your health care provider. Make sure you discuss any questions you have with your health care provider.  

## 2014-03-16 LAB — GC/CHLAMYDIA PROBE AMP
CT PROBE, AMP APTIMA: NEGATIVE
GC PROBE AMP APTIMA: NEGATIVE

## 2014-03-17 LAB — URINE CULTURE: Colony Count: 50000

## 2014-08-23 ENCOUNTER — Encounter (HOSPITAL_COMMUNITY): Payer: Self-pay | Admitting: Emergency Medicine

## 2014-08-23 ENCOUNTER — Emergency Department (HOSPITAL_COMMUNITY): Payer: 59

## 2014-08-23 ENCOUNTER — Emergency Department (HOSPITAL_COMMUNITY)
Admission: EM | Admit: 2014-08-23 | Discharge: 2014-08-23 | Disposition: A | Discharge status: Home / Self Care | Payer: 59 | Attending: Emergency Medicine | Admitting: Emergency Medicine

## 2014-08-23 DIAGNOSIS — Z792 Long term (current) use of antibiotics: Secondary | ICD-10-CM | POA: Diagnosis not present

## 2014-08-23 DIAGNOSIS — Z3202 Encounter for pregnancy test, result negative: Secondary | ICD-10-CM | POA: Diagnosis not present

## 2014-08-23 DIAGNOSIS — H6502 Acute serous otitis media, left ear: Secondary | ICD-10-CM | POA: Diagnosis not present

## 2014-08-23 DIAGNOSIS — Z79899 Other long term (current) drug therapy: Secondary | ICD-10-CM | POA: Insufficient documentation

## 2014-08-23 DIAGNOSIS — Z86018 Personal history of other benign neoplasm: Secondary | ICD-10-CM | POA: Diagnosis not present

## 2014-08-23 DIAGNOSIS — R519 Headache, unspecified: Secondary | ICD-10-CM

## 2014-08-23 DIAGNOSIS — R51 Headache: Secondary | ICD-10-CM | POA: Diagnosis present

## 2014-08-23 LAB — CBC WITH DIFFERENTIAL/PLATELET
BASOS PCT: 0 % (ref 0–1)
Basophils Absolute: 0 10*3/uL (ref 0.0–0.1)
EOS ABS: 0.1 10*3/uL (ref 0.0–0.7)
Eosinophils Relative: 2 % (ref 0–5)
HCT: 39.7 % (ref 36.0–46.0)
HEMOGLOBIN: 13.1 g/dL (ref 12.0–15.0)
Lymphocytes Relative: 42 % (ref 12–46)
Lymphs Abs: 1.9 10*3/uL (ref 0.7–4.0)
MCH: 28.2 pg (ref 26.0–34.0)
MCHC: 33 g/dL (ref 30.0–36.0)
MCV: 85.4 fL (ref 78.0–100.0)
Monocytes Absolute: 0.4 10*3/uL (ref 0.1–1.0)
Monocytes Relative: 8 % (ref 3–12)
NEUTROS ABS: 2.2 10*3/uL (ref 1.7–7.7)
NEUTROS PCT: 48 % (ref 43–77)
PLATELETS: 279 10*3/uL (ref 150–400)
RBC: 4.65 MIL/uL (ref 3.87–5.11)
RDW: 13.6 % (ref 11.5–15.5)
WBC: 4.5 10*3/uL (ref 4.0–10.5)

## 2014-08-23 LAB — BASIC METABOLIC PANEL
Anion gap: 7 (ref 5–15)
BUN: 11 mg/dL (ref 6–23)
CALCIUM: 8.5 mg/dL (ref 8.4–10.5)
CO2: 24 mmol/L (ref 19–32)
CREATININE: 0.68 mg/dL (ref 0.50–1.10)
Chloride: 105 mmol/L (ref 96–112)
GFR calc Af Amer: >90 mL/min (ref >90)
Glucose, Bld: 96 mg/dL (ref 70–99)
Potassium: 4.1 mmol/L (ref 3.5–5.1)
Sodium: 136 mmol/L (ref 135–145)

## 2014-08-23 LAB — URINALYSIS, ROUTINE W REFLEX MICROSCOPIC
Bilirubin Urine: NEGATIVE
GLUCOSE, UA: NEGATIVE mg/dL
Ketones, ur: NEGATIVE mg/dL
Nitrite: NEGATIVE
Protein, ur: NEGATIVE mg/dL
SPECIFIC GRAVITY, URINE: 1.015 (ref 1.005–1.030)
Urobilinogen, UA: 0.2 mg/dL (ref 0.0–1.0)
pH: 6 (ref 5.0–8.0)

## 2014-08-23 LAB — URINE MICROSCOPIC-ADD ON

## 2014-08-23 LAB — PREGNANCY, URINE: Preg Test, Ur: NEGATIVE

## 2014-08-23 MED ORDER — DIPHENHYDRAMINE HCL 50 MG/ML IJ SOLN
50.0000 mg | Freq: Once | INTRAMUSCULAR | Status: AC
  Administered 2014-08-23: 50 mg via INTRAVENOUS
  Filled 2014-08-23: qty 1

## 2014-08-23 MED ORDER — SODIUM CHLORIDE 0.9 % IV BOLUS (SEPSIS)
1000.0000 mL | Freq: Once | INTRAVENOUS | Status: AC
  Administered 2014-08-23: 1000 mL via INTRAVENOUS

## 2014-08-23 MED ORDER — METOCLOPRAMIDE HCL 5 MG/ML IJ SOLN
10.0000 mg | Freq: Once | INTRAMUSCULAR | Status: DC
  Filled 2014-08-23: qty 2

## 2014-08-23 MED ORDER — AMOXICILLIN 500 MG PO CAPS: 500.0000 mg | ORAL_CAPSULE | Freq: Two times a day (BID) | ORAL | Status: DC

## 2014-08-23 MED ORDER — METOCLOPRAMIDE HCL 5 MG/ML IJ SOLN
10.0000 mg | Freq: Once | INTRAMUSCULAR | Status: AC
  Administered 2014-08-23: 10 mg via INTRAVENOUS

## 2014-08-23 NOTE — ED Notes (Signed)
Patient is resting comfortably. 

## 2014-08-23 NOTE — ED Notes (Signed)
Pt reports onset of headache yesterday. Pt states she started vomiting blood.

## 2014-08-23 NOTE — Discharge Instructions (Signed)
If you were given medicines take as directed.  If you are on coumadin or contraceptives realize their levels and effectiveness is altered by many different medicines.  If you have any reaction (rash, tongues swelling, other) to the medicines stop taking and see a physician.  Avoid NSAIDS until you see your doctor as ulcers can cause vomiting blood.  Take antibiotics as discussed.  Please follow up as directed and return to the ER or see a physician for new or worsening symptoms.  Thank you. Filed Vitals:   08/23/14 0946 08/23/14 1130 08/23/14 1143  BP: 119/80  105/59  Pulse: 89 79 84  Temp: 98.2 F (36.8 C)  98.6 F (37 C)  TempSrc: Oral  Oral  Resp: 18  14  Height: 5\' 2"  (1.575 m)    Weight: 229 lb (103.874 kg)    SpO2: 99% 99% 98%

## 2014-08-23 NOTE — ED Provider Notes (Signed)
CSN: [id number]     Arrival date & time 08/23/14  3888 History  This chart was scribed for Elnora Morrison, MD by Mercy Moore, ED scribe.  This patient was seen in room APA07/APA07 and the patient's care was started at 10:19 AM.   Chief Complaint  Patient presents with  . Headache  . Hematemesis   The history is provided by the patient. No language interpreter was used.   HPI Comments: Jessica Sanchez is a 29 y.o. female who presents to the Emergency Department complaining of gradual onset left headache four days ago with drastic worsening yesterday. Patient reports 6 episodes of hematemesis last night and 2 this morning; patient describes deep red blood. Patient reports history of headaches which occur monthly. Patient is unsure of causation for her recent exacerbation. Patient describes pressure in her head and pain with lateral rotation of neck when pain is at it's worst. Patient reports vomiting associated with her headaches once; the combination is not typical. Patient denies history of ulcers.   Past Medical History  Diagnosis Date  . Fibroid, uterine   . Fibroid (bleeding) (uterine)   . Menorrhagia   . H/O metrorrhagia    Past Surgical History  Procedure Laterality Date  . Tonsillectomy    . Myomectomy  02/03/2011    Procedure: MYOMECTOMY;  Surgeon: [name], MD;  Location: [facility];  Service: Gynecology;  Laterality: N/A;  . Hysteroscopy w/d&c N/A 09/27/2013    Procedure: DILATATION AND CURETTAGE /HYSTEROSCOPY and insertion of Mirena IUD ;  Surgeon: [name]. Harrington Challenger, MD;  Location: [facility];  Service: Gynecology;  Laterality: N/A;   Family History  Problem Relation Age of Onset  . Anesthesia problems Neg Hx   . Hypotension Neg Hx   . Malignant hyperthermia Neg Hx   . Pseudochol deficiency Neg Hx    History  Substance Use Topics  . Smoking status: Never Smoker   . Smokeless tobacco: Never Used  . Alcohol Use: No   OB History    Gravida Para Term Preterm AB TAB SAB Ectopic  Multiple Living   1              Review of Systems  Constitutional: Positive for fever and fatigue.  HENT: Negative for ear pain.   Eyes: Negative for visual disturbance.  Gastrointestinal: Negative for abdominal pain.  Genitourinary: Negative for dysuria.  Neurological: Positive for headaches. Negative for weakness and numbness.      Allergies  Review of patient's allergies indicates no known allergies.  Home Medications   Prior to Admission medications   Medication Sig Start Date End Date Taking? Authorizing Provider  aspirin-acetaminophen-caffeine (EXCEDRIN MIGRAINE) [phone] MG per tablet Take 1 tablet by mouth every 6 (six) hours as needed for headache or migraine.   Yes Historical Provider, MD  levonorgestrel (MIRENA) 20 MCG/24HR IUD 1 each by Intrauterine route once.   Yes Historical Provider, MD  Multiple Vitamin (MULTIVITAMIN) capsule Take 1 capsule by mouth daily.   Yes Historical Provider, MD  amoxicillin (AMOXIL) 500 MG capsule Take 1 capsule (500 mg total) by mouth 2 (two) times daily. 08/23/14   Elnora Morrison, MD  ibuprofen (ADVIL,MOTRIN) 600 MG tablet Take 1 tablet (600 mg total) by mouth every 6 (six) hours as needed. Patient not taking: Reported on 08/23/2014 09/27/13   Vanessa Kick, MD  ibuprofen (ADVIL,MOTRIN) 800 MG tablet Take 1 tablet (800 mg total) by mouth every 8 (eight) hours as needed for mild pain. Patient not taking:  Reported on 08/23/2014 03/15/14   Kristen N Ward, DO  metroNIDAZOLE (FLAGYL) 500 MG tablet Take 1 tablet (500 mg total) by mouth 2 (two) times daily. Do not drink alcohol with this medication. Patient not taking: Reported on 08/23/2014 03/15/14   Kristen N Ward, DO  naproxen (NAPROSYN) 500 MG tablet Take 1 tablet (500 mg total) by mouth 2 (two) times daily. Patient not taking: Reported on 03/15/2014 11/02/51   Delora Fuel, MD   Triage Vitals: BP 119/80 mmHg  Pulse 89  Temp(Src) 98.2 F (36.8 C) (Oral)  Resp 18  Ht 5\' 2"  (1.575 m)  Wt 229 lb  (103.874 kg)  BMI 41.87 kg/m2  SpO2 99% Physical Exam  Constitutional: She is oriented to person, place, and time. She appears well-developed and well-nourished. No distress.  HENT:  Head: Normocephalic and atraumatic.  Fluid and ossification in left TM.   Eyes: EOM are normal. Pupils are equal, round, and reactive to light.  Visual fields intact.   Neck: Neck supple. No tracheal deviation present.  Tender to posterior auricular and interauricular on left. No triumus. Not meningismic.   Cardiovascular: Normal rate.   Pulmonary/Chest: Effort normal. No respiratory distress.  Abdominal: Soft. There is no tenderness.  Musculoskeletal: Normal range of motion.  Lymphadenopathy:    She has no cervical adenopathy.  Neurological: She is alert and oriented to person, place, and time. No cranial nerve deficit.  No facial droop. Finger nose intact. Coordination intact.  No arm driift. Equal strength in all extremities. Sensation intact.   Skin: Skin is warm and dry.  Psychiatric: She has a normal mood and affect. Her behavior is normal.  Nursing note and vitals reviewed.   ED Course  Procedures (including critical care time)  COORDINATION OF CARE: 12:40 PM- Discussed treatment plan with patient at bedside and patient agreed to plan.   Labs Review Labs Reviewed  URINALYSIS, ROUTINE W REFLEX MICROSCOPIC - Abnormal; Notable for the following:    APPearance HAZY (*)    Hgb urine dipstick MODERATE (*)    Leukocytes, UA SMALL (*)    All other components within normal limits  CBC WITH DIFFERENTIAL/PLATELET  BASIC METABOLIC PANEL  PREGNANCY, URINE  URINE MICROSCOPIC-ADD ON    Imaging Review Ct Head Wo Contrast  08/23/2014   CLINICAL DATA:  Left-sided headache and blurred vision left eye  EXAM: CT HEAD WITHOUT CONTRAST  TECHNIQUE: Contiguous axial images were obtained from the base of the skull through the vertex without intravenous contrast.  COMPARISON:  None.  FINDINGS: Ventricle size  is normal. Negative for acute or chronic infarction. Negative for hemorrhage or fluid collection. Negative for mass or edema. No shift of the midline structures.  Calvarium is intact.  IMPRESSION: Normal   Electronically Signed   By: Franchot Gallo M.D.   On: 08/23/2014 12:10     EKG Interpretation None      MDM   Final diagnoses:  Headache  Acute serous otitis media of left ear, recurrence not specified  Hematemesis  I personally performed the services described in this documentation, which was scribed in my presence. The recorded information has been reviewed and is accurate.  Patient presented with gradual onset headache and clinical concern for otitis media/left periauricular cervical adenopathy. Blood work reviewed no acute findings, CT head results reviewed no acute findings, vitals normal. Patient proves significant and recheck. Follow-up outpatient discussed Results and differential diagnosis were discussed with the patient/parent/guardian. Close follow up outpatient was discussed, comfortable with the  plan.   Medications  sodium chloride 0.9 % bolus 1,000 mL (1,000 mLs Intravenous New Bag/Given 08/23/14 1040)  diphenhydrAMINE (BENADRYL) injection 50 mg (50 mg Intravenous Given 08/23/14 1045)  metoCLOPramide (REGLAN) injection 10 mg (10 mg Intravenous Given 08/23/14 1045)    Filed Vitals:   08/23/14 1143 08/23/14 1200 08/23/14 1215 08/23/14 1230  BP: 105/59 116/79  104/76  Pulse: 84 92 81 89  Temp: 98.6 F (37 C)     TempSrc: Oral     Resp: 14     Height:      Weight:      SpO2: 98% 100% 99% 99%    Final diagnoses:  Headache  Acute serous otitis media of left ear, recurrence not specified       Elnora Morrison, MD 08/23/14 1242

## 2014-11-05 ENCOUNTER — Emergency Department (HOSPITAL_COMMUNITY)
Admission: EM | Admit: 2014-11-05 | Discharge: 2014-11-06 | Disposition: A | Discharge status: Home / Self Care | Payer: 59 | Attending: Emergency Medicine | Admitting: Emergency Medicine

## 2014-11-05 ENCOUNTER — Encounter (HOSPITAL_COMMUNITY): Payer: Self-pay

## 2014-11-05 DIAGNOSIS — K5900 Constipation, unspecified: Secondary | ICD-10-CM | POA: Insufficient documentation

## 2014-11-05 DIAGNOSIS — R109 Unspecified abdominal pain: Secondary | ICD-10-CM

## 2014-11-05 DIAGNOSIS — M549 Dorsalgia, unspecified: Secondary | ICD-10-CM | POA: Insufficient documentation

## 2014-11-05 DIAGNOSIS — Z86018 Personal history of other benign neoplasm: Secondary | ICD-10-CM | POA: Insufficient documentation

## 2014-11-05 DIAGNOSIS — Z8719 Personal history of other diseases of the digestive system: Secondary | ICD-10-CM | POA: Diagnosis not present

## 2014-11-05 DIAGNOSIS — Z3202 Encounter for pregnancy test, result negative: Secondary | ICD-10-CM | POA: Diagnosis not present

## 2014-11-05 DIAGNOSIS — R300 Dysuria: Secondary | ICD-10-CM | POA: Insufficient documentation

## 2014-11-05 LAB — COMPREHENSIVE METABOLIC PANEL
ALBUMIN: 3.9 g/dL (ref 3.5–5.0)
ALT: 15 U/L (ref 14–54)
AST: 16 U/L (ref 15–41)
Alkaline Phosphatase: 54 U/L (ref 38–126)
Anion gap: 8 (ref 5–15)
BUN: 10 mg/dL (ref 6–20)
CO2: 25 mmol/L (ref 22–32)
CREATININE: 0.91 mg/dL (ref 0.44–1.00)
Calcium: 9 mg/dL (ref 8.9–10.3)
Chloride: 105 mmol/L (ref 101–111)
GFR calc Af Amer: >60 mL/min (ref >60)
GFR calc non Af Amer: >60 mL/min (ref >60)
Glucose, Bld: 113 mg/dL — ABNORMAL HIGH (ref 65–99)
POTASSIUM: 4.2 mmol/L (ref 3.5–5.1)
Sodium: 138 mmol/L (ref 135–145)
Total Bilirubin: 0.6 mg/dL (ref 0.3–1.2)
Total Protein: 7 g/dL (ref 6.5–8.1)

## 2014-11-05 LAB — URINE MICROSCOPIC-ADD ON

## 2014-11-05 LAB — CBC WITH DIFFERENTIAL/PLATELET
Basophils Absolute: 0 10*3/uL (ref 0.0–0.1)
Basophils Relative: 1 % (ref 0–1)
Eosinophils Absolute: 0.1 10*3/uL (ref 0.0–0.7)
Eosinophils Relative: 1 % (ref 0–5)
HCT: 37.6 % (ref 36.0–46.0)
Hemoglobin: 12.4 g/dL (ref 12.0–15.0)
LYMPHS ABS: 2.6 10*3/uL (ref 0.7–4.0)
LYMPHS PCT: 45 % (ref 12–46)
MCH: 28.2 pg (ref 26.0–34.0)
MCHC: 33 g/dL (ref 30.0–36.0)
MCV: 85.5 fL (ref 78.0–100.0)
Monocytes Absolute: 0.4 10*3/uL (ref 0.1–1.0)
Monocytes Relative: 8 % (ref 3–12)
NEUTROS PCT: 45 % (ref 43–77)
Neutro Abs: 2.6 10*3/uL (ref 1.7–7.7)
PLATELETS: 285 10*3/uL (ref 150–400)
RBC: 4.4 MIL/uL (ref 3.87–5.11)
RDW: 13.7 % (ref 11.5–15.5)
WBC: 5.7 10*3/uL (ref 4.0–10.5)

## 2014-11-05 LAB — URINALYSIS, ROUTINE W REFLEX MICROSCOPIC
BILIRUBIN URINE: NEGATIVE
GLUCOSE, UA: NEGATIVE mg/dL
KETONES UR: NEGATIVE mg/dL
Leukocytes, UA: NEGATIVE
Nitrite: NEGATIVE
PH: 7 (ref 5.0–8.0)
PROTEIN: NEGATIVE mg/dL
Specific Gravity, Urine: 1.025 (ref 1.005–1.030)
Urobilinogen, UA: 0.2 mg/dL (ref 0.0–1.0)

## 2014-11-05 LAB — PREGNANCY, URINE: Preg Test, Ur: NEGATIVE

## 2014-11-05 MED ORDER — SODIUM CHLORIDE 0.9 % IV BOLUS (SEPSIS)
500.0000 mL | Freq: Once | INTRAVENOUS | Status: AC
  Administered 2014-11-05: 500 mL via INTRAVENOUS

## 2014-11-05 MED ORDER — KETOROLAC TROMETHAMINE 30 MG/ML IJ SOLN
30.0000 mg | Freq: Once | INTRAMUSCULAR | Status: AC
  Administered 2014-11-05: 30 mg via INTRAVENOUS
  Filled 2014-11-05: qty 1

## 2014-11-05 NOTE — ED Provider Notes (Signed)
CSN: [id number]     Arrival date & time 11/05/14  2055 History   This chart was scribed for Nat Christen, MD by Terressa Koyanagi, ED Scribe. This patient was seen in room APA04/APA04 and the patient's care was started at 9:34 PM.   Chief Complaint  Patient presents with  . Abdominal Pain   The history is provided by the patient. No language interpreter was used.   PCP: Maricela Curet, MD HPI Comments: Jessica Sanchez is a 29 y.o. female, with PMHx noted below including fibroid tumors and subsequent fibroid tumor removal and constipation, who presents to the Emergency Department complaining of atraumatic, sudden onset, worsening, 10/10 abd pain with associated dysuria and mild back pain onset today at 12:30PM. Pt denies constipation, noting she had a bowel movement following the onset of abd pain. Pt denies taking any measures at home to alleviate her Sx. Pt reports similar abd pain on one prior occasion, the cause of which was fibroid tumors. Pt denies any other urinary Sx or any other Sx at this time. Pt reports her last period was on 10/16/14.  Past Medical History  Diagnosis Date  . Fibroid, uterine   . Fibroid (bleeding) (uterine)   . Menorrhagia   . H/O metrorrhagia    Past Surgical History  Procedure Laterality Date  . Tonsillectomy    . Myomectomy  02/03/2011    Procedure: MYOMECTOMY;  Surgeon: [name], MD;  Location: [facility];  Service: Gynecology;  Laterality: N/A;  . Hysteroscopy w/d&c N/A 09/27/2013    Procedure: DILATATION AND CURETTAGE /HYSTEROSCOPY and insertion of Mirena IUD ;  Surgeon: [name]. Harrington Challenger, MD;  Location: [facility];  Service: Gynecology;  Laterality: N/A;   Family History  Problem Relation Age of Onset  . Anesthesia problems Neg Hx   . Hypotension Neg Hx   . Malignant hyperthermia Neg Hx   . Pseudochol deficiency Neg Hx    History  Substance Use Topics  . Smoking status: Never Smoker   . Smokeless tobacco: Never Used  . Alcohol Use: No   OB History    Gravida Para Term Preterm AB TAB SAB Ectopic Multiple Living   1              Review of Systems  Constitutional: Negative for fever and chills.  Gastrointestinal: Positive for abdominal pain. Negative for constipation.  Genitourinary: Positive for dysuria.  Musculoskeletal: Positive for back pain.   A complete 10 system review of systems was obtained and all systems are negative except as noted in the HPI and PMH.  Allergies  Review of patient's allergies indicates no known allergies.  Home Medications   Prior to Admission medications   Medication Sig Start Date End Date Taking? Authorizing Provider  levonorgestrel (MIRENA) 20 MCG/24HR IUD 1 each by Intrauterine route once.   Yes Historical Provider, MD   Triage Vitals: BP 132/68 mmHg  Pulse 98  Temp(Src) 98.7 F (37.1 C) (Oral)  Resp 17  Ht 5\' 2"  (1.575 m)  Wt 239 lb (108.41 kg)  BMI 43.70 kg/m2  SpO2 100%  LMP 10/16/2014 Physical Exam  Constitutional: She is oriented to person, place, and time. She appears well-developed and well-nourished.  HENT:  Head: Normocephalic and atraumatic.  Eyes: Conjunctivae and EOM are normal. Pupils are equal, round, and reactive to light.  Neck: Normal range of motion. Neck supple.  Cardiovascular: Normal rate and regular rhythm.   Pulmonary/Chest: Effort normal and breath sounds normal.  Abdominal: Soft. Bowel  sounds are normal. There is tenderness (mild Left, mid, anaterior, lateral abd pain).  Genitourinary:  Mild left flank tenderness  Musculoskeletal: Normal range of motion.  Neurological: She is alert and oriented to person, place, and time.  Skin: Skin is warm and dry.  Psychiatric: She has a normal mood and affect. Her behavior is normal.  Nursing note and vitals reviewed.   ED Course  Procedures (including critical care time) DIAGNOSTIC STUDIES: Oxygen Saturation is 100% on RA, nl by my interpretation.    COORDINATION OF CARE: 9:40 PM-Discussed treatment plan which  includes labs and UA with pt at bedside and pt agreed to plan.   Labs Review Labs Reviewed  COMPREHENSIVE METABOLIC PANEL - Abnormal; Notable for the following:    Glucose, Bld 113 (*)    All other components within normal limits  URINALYSIS, ROUTINE W REFLEX MICROSCOPIC (NOT AT [facility]) - Abnormal; Notable for the following:    Hgb urine dipstick SMALL (*)    All other components within normal limits  URINE MICROSCOPIC-ADD ON - Abnormal; Notable for the following:    Squamous Epithelial / LPF MANY (*)    Bacteria, UA MANY (*)    All other components within normal limits  CBC WITH DIFFERENTIAL/PLATELET  PREGNANCY, URINE    Imaging Review No results found.   EKG Interpretation None      MDM   Final diagnoses:  Left flank pain    Patient has persistent left flank pain radiating to left lower quadrant with hemoglobin in urine. Will obtain CT renal scan. Discussed with Dr. Tomi Bamberger  I personally performed the services described in this documentation, which was scribed in my presence. The recorded information has been reviewed and is accurate.    Nat Christen, MD 11/06/14 0005

## 2014-11-05 NOTE — ED Notes (Signed)
Abdominal pain with N/V that started today. Emesis X2. Patient denies diarrhea.

## 2014-11-06 ENCOUNTER — Emergency Department (HOSPITAL_COMMUNITY): Payer: 59

## 2014-11-06 MED ORDER — CYCLOBENZAPRINE HCL 5 MG PO TABS: 5.0000 mg | ORAL_TABLET | Freq: Three times a day (TID) | ORAL | Status: DC | PRN

## 2014-11-06 MED ORDER — HYDROCODONE-ACETAMINOPHEN 5-325 MG PO TABS: 1.0000 | ORAL_TABLET | ORAL | Status: DC | PRN

## 2014-11-06 MED ORDER — NAPROXEN 500 MG PO TABS
500.0000 mg | ORAL_TABLET | Freq: Two times a day (BID) | ORAL | Status: DC
Start: 2014-11-06 — End: 2015-06-30

## 2014-11-06 NOTE — ED Provider Notes (Signed)
Pt left at change of shift to get results of [facility] CT scan. Pt describes LUQ/left flank pain that hurts with change in positions. It started about 12:30 PM today and she had 2 episodes of vomiting. She describes having a hard time having BM's and states she has to strain a lot.   Pt is laying on her left side but does not appear to be in distress.    Ct Renal Stone Study  11/06/2014   CLINICAL DATA:  Initial evaluation for acute left flank pain.  EXAM: CT ABDOMEN AND PELVIS WITHOUT CONTRAST  TECHNIQUE: Multidetector CT imaging of the abdomen and pelvis was performed following the standard protocol without IV contrast.  COMPARISON:  Prior CT from 11/06/2010  FINDINGS: The visualized lung bases are clear para  Noncontrast evaluation of the liver is unremarkable. Gallbladder is largely decompressed but grossly within normal limits. No biliary dilatation. Spleen, adrenal glands, and pancreas demonstrate a normal unenhanced appearance.  Kidneys are equal in size without evidence of nephrolithiasis or hydronephrosis. No radiopaque calculi seen along the course of either renal collecting system. There is no hydroureter.  Stomach within normal limits. No evidence for bowel obstruction. Appendix is normal. No acute inflammatory changes seen about the bowels.  Bladder within normal limits. IUD in place within the uterus. Uterus and ovaries demonstrate no acute abnormality.  No free air or fluid. No adenopathy. Small fat containing paraumbilical hernia noted.  No acute osseous abnormality. No worrisome lytic or blastic osseous lesions.  IMPRESSION: 1. No CT evidence for nephrolithiasis or obstructive uropathy. 2. No other acute intra-abdominal or pelvic process identified.   Electronically Signed   By: Jeannine Boga M.D.   On: 11/06/2014 00:50    Diagnoses that have been ruled out:  None  Diagnoses that are still under consideration:  None  Final diagnoses:  Left flank pain  Constipation, unspecified  constipation type    New Prescriptions   CYCLOBENZAPRINE (FLEXERIL) 5 MG TABLET    Take 1 tablet (5 mg total) by mouth 3 (three) times daily as needed for muscle spasms.   HYDROCODONE-ACETAMINOPHEN (NORCO/VICODIN) 5-325 MG PER TABLET    Take 1-2 tablets by mouth every 4 (four) hours as needed.   NAPROXEN (NAPROSYN) 500 MG TABLET    Take 1 tablet (500 mg total) by mouth 2 (two) times daily.   Plan discharge  Rolland Porter, MD, Barbette Or, MD 11/06/14 [phone]

## 2014-11-06 NOTE — Discharge Instructions (Signed)
Take the medication for pain as prescribed.  Get miralax and put one dose or 17 g in 8 ounces of water,  take 1 dose every 30 minutes for 2-3 hours or until you  get good results and then once or twice daily to prevent constipation. Try ice and heat over the painful area. Follow-up your primary care doctor if you aren't improving in the next week.  Return to the ED if you get a fever, have uncontrolled vomiting.

## 2014-11-20 ENCOUNTER — Emergency Department (HOSPITAL_COMMUNITY)
Admission: EM | Admit: 2014-11-20 | Discharge: 2014-11-20 | Disposition: A | Discharge status: Home / Self Care | Payer: 59 | Attending: Emergency Medicine | Admitting: Emergency Medicine

## 2014-11-20 ENCOUNTER — Emergency Department (HOSPITAL_COMMUNITY): Payer: 59

## 2014-11-20 ENCOUNTER — Encounter (HOSPITAL_COMMUNITY): Payer: Self-pay | Admitting: Emergency Medicine

## 2014-11-20 DIAGNOSIS — Z86018 Personal history of other benign neoplasm: Secondary | ICD-10-CM | POA: Diagnosis not present

## 2014-11-20 DIAGNOSIS — Z791 Long term (current) use of non-steroidal anti-inflammatories (NSAID): Secondary | ICD-10-CM | POA: Insufficient documentation

## 2014-11-20 DIAGNOSIS — Z8742 Personal history of other diseases of the female genital tract: Secondary | ICD-10-CM | POA: Insufficient documentation

## 2014-11-20 DIAGNOSIS — R600 Localized edema: Secondary | ICD-10-CM | POA: Insufficient documentation

## 2014-11-20 DIAGNOSIS — M7989 Other specified soft tissue disorders: Secondary | ICD-10-CM | POA: Diagnosis present

## 2014-11-20 LAB — BASIC METABOLIC PANEL
Anion gap: 6 (ref 5–15)
BUN: 13 mg/dL (ref 6–20)
CO2: 25 mmol/L (ref 22–32)
Calcium: 8.6 mg/dL — ABNORMAL LOW (ref 8.9–10.3)
Chloride: 106 mmol/L (ref 101–111)
Creatinine, Ser: 0.82 mg/dL (ref 0.44–1.00)
GFR calc Af Amer: >60 mL/min (ref >60)
GFR calc non Af Amer: >60 mL/min (ref >60)
Glucose, Bld: 113 mg/dL — ABNORMAL HIGH (ref 65–99)
POTASSIUM: 4.1 mmol/L (ref 3.5–5.1)
SODIUM: 137 mmol/L (ref 135–145)

## 2014-11-20 LAB — CBC WITH DIFFERENTIAL/PLATELET
BASOS PCT: 0 % (ref 0–1)
Basophils Absolute: 0 10*3/uL (ref 0.0–0.1)
Eosinophils Absolute: 0.1 10*3/uL (ref 0.0–0.7)
Eosinophils Relative: 1 % (ref 0–5)
HCT: 37.5 % (ref 36.0–46.0)
Hemoglobin: 12.3 g/dL (ref 12.0–15.0)
LYMPHS ABS: 1.9 10*3/uL (ref 0.7–4.0)
LYMPHS PCT: 35 % (ref 12–46)
MCH: 28.3 pg (ref 26.0–34.0)
MCHC: 32.8 g/dL (ref 30.0–36.0)
MCV: 86.4 fL (ref 78.0–100.0)
MONO ABS: 0.5 10*3/uL (ref 0.1–1.0)
MONOS PCT: 10 % (ref 3–12)
Neutro Abs: 3 10*3/uL (ref 1.7–7.7)
Neutrophils Relative %: 54 % (ref 43–77)
Platelets: 276 10*3/uL (ref 150–400)
RBC: 4.34 MIL/uL (ref 3.87–5.11)
RDW: 13.8 % (ref 11.5–15.5)
WBC: 5.5 10*3/uL (ref 4.0–10.5)

## 2014-11-20 LAB — D-DIMER, QUANTITATIVE (NOT AT ARMC): D DIMER QUANT: 0.5 ug{FEU}/mL — AB (ref 0.00–0.48)

## 2014-11-20 MED ORDER — HYDROCODONE-ACETAMINOPHEN 5-325 MG PO TABS: 1.0000 | ORAL_TABLET | ORAL | Status: DC | PRN

## 2014-11-20 MED ORDER — ENOXAPARIN SODIUM 100 MG/ML ~~LOC~~ SOLN
100.0000 mg | Freq: Once | SUBCUTANEOUS | Status: AC
  Administered 2014-11-20: 100 mg via SUBCUTANEOUS
  Filled 2014-11-20: qty 1

## 2014-11-20 MED ORDER — HYDROCODONE-ACETAMINOPHEN 5-325 MG PO TABS
1.0000 | ORAL_TABLET | Freq: Once | ORAL | Status: AC
  Administered 2014-11-20: 1 via ORAL
  Filled 2014-11-20: qty 1

## 2014-11-20 NOTE — ED Notes (Signed)
Pt c/o rt lower leg and foot swelling. Pt denies any injury.

## 2014-11-20 NOTE — ED Provider Notes (Signed)
CSN: [id number]     Arrival date & time 11/20/14  1858 History   First MD Initiated Contact with Patient 11/20/14 1931     Chief Complaint  Patient presents with  . Foot Swelling     (Consider location/radiation/quality/duration/timing/severity/associated sxs/prior Treatment) The history is provided by the patient.   Jessica Sanchez is a 29 y.o. female presenting with a several day history of right lower extremity including calf and foot swelling and discomfort.  She describes intermittent episodes of sharp stabbing pain starting from her mid foot up into her anterior tibia, but also describes pressure sensation in her calf.  She has had increased swelling after standing, improved currently as she is sitting and elevating her legs.  She denies any new injury to this extremity, but did have a contusion to her anterior mid tibia several weeks ago which is still healing.   She denies fevers or chills, shortness of breath or chest pain.  She has had no recent long periods of inactivity, no recent long car rides or plane trips.  She  does a Mirena placed one year ago. symptoms lessen with ambulation and are better at rest.  She is employed at [employer] with constant standing and walking with her job.        Past Medical History  Diagnosis Date  . Fibroid, uterine   . Fibroid (bleeding) (uterine)   . Menorrhagia   . H/O metrorrhagia    Past Surgical History  Procedure Laterality Date  . Tonsillectomy    . Myomectomy  02/03/2011    Procedure: MYOMECTOMY;  Surgeon: [name], MD;  Location: [facility];  Service: Gynecology;  Laterality: N/A;  . Hysteroscopy w/d&c N/A 09/27/2013    Procedure: DILATATION AND CURETTAGE /HYSTEROSCOPY and insertion of Mirena IUD ;  Surgeon: [name]. Harrington Challenger, MD;  Location: [facility];  Service: Gynecology;  Laterality: N/A;   Family History  Problem Relation Age of Onset  . Anesthesia problems Neg Hx   . Hypotension Neg Hx   . Malignant hyperthermia Neg Hx   . Pseudochol  deficiency Neg Hx    History  Substance Use Topics  . Smoking status: Never Smoker   . Smokeless tobacco: Never Used  . Alcohol Use: No   OB History    Gravida Para Term Preterm AB TAB SAB Ectopic Multiple Living   1              Review of Systems  Constitutional: Negative for fever.  HENT: Negative for congestion and sore throat.   Eyes: Negative.   Respiratory: Negative for chest tightness and shortness of breath.   Cardiovascular: Negative for chest pain.  Gastrointestinal: Negative for nausea and abdominal pain.  Genitourinary: Negative.   Musculoskeletal: Positive for arthralgias. Negative for joint swelling and neck pain.  Skin: Negative.  Negative for rash and wound.  Neurological: Negative for dizziness, weakness, light-headedness, numbness and headaches.  Psychiatric/Behavioral: Negative.       Allergies  Review of patient's allergies indicates no known allergies.  Home Medications   Prior to Admission medications   Medication Sig Start Date End Date Taking? Authorizing Provider  ibuprofen (ADVIL,MOTRIN) 200 MG tablet Take 200 mg by mouth every 6 (six) hours as needed for mild pain or moderate pain.   Yes Historical Provider, MD  levonorgestrel (MIRENA) 20 MCG/24HR IUD 1 each by Intrauterine route once.   Yes Historical Provider, MD  polyethylene glycol (MIRALAX / GLYCOLAX) packet Take 17 g by mouth daily  as needed for mild constipation.   Yes Historical Provider, MD  cyclobenzaprine (FLEXERIL) 5 MG tablet Take 1 tablet (5 mg total) by mouth 3 (three) times daily as needed for muscle spasms. Patient not taking: Reported on 11/20/2014 11/06/14   Rolland Porter, MD  HYDROcodone-acetaminophen (NORCO/VICODIN) 5-325 MG per tablet Take 1 tablet by mouth every 4 (four) hours as needed. 11/20/14   [name], PA-C  naproxen (NAPROSYN) 500 MG tablet Take 1 tablet (500 mg total) by mouth 2 (two) times daily. Patient not taking: Reported on 11/20/2014 11/06/14   Rolland Porter, MD   BP  103/62 mmHg  Pulse 89  Temp(Src) 98 F (36.7 C)  Resp 18  Ht 5\' 2"  (1.575 m)  Wt 240 lb (108.863 kg)  BMI 43.89 kg/m2  SpO2 100%  LMP 10/16/2014 Physical Exam  Constitutional: She appears well-developed and well-nourished.  HENT:  Head: Atraumatic.  Neck: Normal range of motion.  Cardiovascular:  Pulses equal bilaterally  Musculoskeletal: She exhibits edema and tenderness.  Nonpitting edema to right foot and distal tibia.  She has a well-healing abrasion superior to the involved area of edema.  Her calf is tender to palpation but soft without cords.  Dorsalis pedis pulse is intact and 2+.  Neurological: She is alert. She has normal strength. She displays normal reflexes. No sensory deficit.  Skin: Skin is warm and dry.  Psychiatric: She has a normal mood and affect.    ED Course  Procedures (including critical care time) Labs Review Labs Reviewed  BASIC METABOLIC PANEL - Abnormal; Notable for the following:    Glucose, Bld 113 (*)    Calcium 8.6 (*)    All other components within normal limits  D-DIMER, QUANTITATIVE (NOT AT [facility]) - Abnormal; Notable for the following:    D-Dimer, Quant 0.50 (*)    All other components within normal limits  CBC WITH DIFFERENTIAL/PLATELET    Imaging Review Dg Tibia/fibula Right  11/20/2014   CLINICAL DATA:  Fall approximately 3 weeks ago. Right lower leg swelling and pain. Initial encounter.  EXAM: RIGHT TIBIA AND FIBULA - 2 VIEW  COMPARISON:  None.  FINDINGS: There is no evidence of fracture or other focal bone lesions. Soft tissues are unremarkable.  IMPRESSION: Negative.   Electronically Signed   By: Earle Gell M.D.   On: 11/20/2014 21:03   Dg Foot Complete Right  11/20/2014   CLINICAL DATA:  Fall approximately 3 weeks ago. Persistent right foot swelling and pain. Initial encounter.  EXAM: RIGHT FOOT COMPLETE - 3+ VIEW  COMPARISON:  09/13/2010  FINDINGS: There is no evidence of fracture or dislocation. There is no evidence of arthropathy  or other focal bone abnormality. Soft tissues are unremarkable.  IMPRESSION: Negative.   Electronically Signed   By: Earle Gell M.D.   On: 11/20/2014 21:04     EKG Interpretation None      MDM   Final diagnoses:  Lower leg edema    Patients labs and/or radiological studies were reviewed and considered during the medical decision making and disposition process.  Results were also discussed with patient.  Concerned for possible dvt in right lower extremity.  She was advised warm compresses, elevation tonight, lovenox dose given here, will return tomorrow at 11 am for doppler US study.  Advise f/u with pcp for recheck if study is negative.      [name], PA-C 11/20/14 [place], MD 11/21/14 [phone]

## 2014-11-20 NOTE — Discharge Instructions (Signed)

## 2014-11-21 ENCOUNTER — Ambulatory Visit (HOSPITAL_COMMUNITY)
Admit: 2014-11-21 | Discharge: 2014-11-21 | Disposition: A | Discharge status: Home / Self Care | Payer: 59 | Source: Ambulatory Visit | Attending: Emergency Medicine | Admitting: Emergency Medicine

## 2014-11-21 DIAGNOSIS — M79604 Pain in right leg: Secondary | ICD-10-CM | POA: Diagnosis present

## 2014-11-21 DIAGNOSIS — M7989 Other specified soft tissue disorders: Secondary | ICD-10-CM | POA: Diagnosis not present

## 2014-11-21 NOTE — ED Provider Notes (Signed)
Pt informed of negative test result, will follow up with pcp.   Ketara Cavness L Melat Wrisley Julio Alm, MD 11/21/14 1257

## 2015-05-01 ENCOUNTER — Emergency Department (HOSPITAL_COMMUNITY)
Admission: EM | Admit: 2015-05-01 | Discharge: 2015-05-01 | Disposition: A | Discharge status: Home / Self Care | Payer: 59 | Attending: Emergency Medicine | Admitting: Emergency Medicine

## 2015-05-01 ENCOUNTER — Encounter (HOSPITAL_COMMUNITY): Payer: Self-pay

## 2015-05-01 DIAGNOSIS — Z86018 Personal history of other benign neoplasm: Secondary | ICD-10-CM | POA: Insufficient documentation

## 2015-05-01 DIAGNOSIS — K529 Noninfective gastroenteritis and colitis, unspecified: Secondary | ICD-10-CM | POA: Insufficient documentation

## 2015-05-01 DIAGNOSIS — Z8742 Personal history of other diseases of the female genital tract: Secondary | ICD-10-CM | POA: Insufficient documentation

## 2015-05-01 LAB — COMPREHENSIVE METABOLIC PANEL
ALK PHOS: 58 U/L (ref 38–126)
ALT: 18 U/L (ref 14–54)
AST: 15 U/L (ref 15–41)
Albumin: 3.9 g/dL (ref 3.5–5.0)
Anion gap: 8 (ref 5–15)
BUN: 10 mg/dL (ref 6–20)
CALCIUM: 9 mg/dL (ref 8.9–10.3)
CO2: 25 mmol/L (ref 22–32)
CREATININE: 0.65 mg/dL (ref 0.44–1.00)
Chloride: 104 mmol/L (ref 101–111)
GFR calc non Af Amer: >60 mL/min (ref >60)
Glucose, Bld: 80 mg/dL (ref 65–99)
Potassium: 4 mmol/L (ref 3.5–5.1)
Sodium: 137 mmol/L (ref 135–145)
Total Bilirubin: 0.3 mg/dL (ref 0.3–1.2)
Total Protein: 7.3 g/dL (ref 6.5–8.1)

## 2015-05-01 LAB — CBC WITH DIFFERENTIAL/PLATELET
Basophils Absolute: 0 10*3/uL (ref 0.0–0.1)
Basophils Relative: 1 %
Eosinophils Absolute: 0.1 10*3/uL (ref 0.0–0.7)
Eosinophils Relative: 1 %
HCT: 43 % (ref 36.0–46.0)
HEMOGLOBIN: 14.3 g/dL (ref 12.0–15.0)
LYMPHS PCT: 47 %
Lymphs Abs: 3 10*3/uL (ref 0.7–4.0)
MCH: 28.4 pg (ref 26.0–34.0)
MCHC: 33.3 g/dL (ref 30.0–36.0)
MCV: 85.3 fL (ref 78.0–100.0)
MONOS PCT: 7 %
Monocytes Absolute: 0.4 10*3/uL (ref 0.1–1.0)
Neutro Abs: 2.7 10*3/uL (ref 1.7–7.7)
Neutrophils Relative %: 44 %
Platelets: 295 10*3/uL (ref 150–400)
RBC: 5.04 MIL/uL (ref 3.87–5.11)
RDW: 14 % (ref 11.5–15.5)
WBC: 6.2 10*3/uL (ref 4.0–10.5)

## 2015-05-01 MED ORDER — ONDANSETRON 4 MG PO TBDP: ORAL_TABLET | ORAL | Status: DC

## 2015-05-01 MED ORDER — ONDANSETRON HCL 4 MG/2ML IJ SOLN
4.0000 mg | Freq: Once | INTRAMUSCULAR | Status: AC
  Administered 2015-05-01: 4 mg via INTRAVENOUS
  Filled 2015-05-01: qty 2

## 2015-05-01 MED ORDER — SODIUM CHLORIDE 0.9 % IV BOLUS (SEPSIS)
2000.0000 mL | Freq: Once | INTRAVENOUS | Status: AC
  Administered 2015-05-01: 2000 mL via INTRAVENOUS

## 2015-05-01 MED ORDER — KETOROLAC TROMETHAMINE 30 MG/ML IJ SOLN
30.0000 mg | Freq: Once | INTRAMUSCULAR | Status: AC
  Administered 2015-05-01: 30 mg via INTRAVENOUS
  Filled 2015-05-01: qty 1

## 2015-05-01 NOTE — ED Provider Notes (Signed)
CSN: [id number]     Arrival date & time 05/01/15  1236 History   First MD Initiated Contact with Patient 05/01/15 1537     Chief Complaint  Patient presents with  . Emesis     (Consider location/radiation/quality/duration/timing/severity/associated sxs/prior Treatment) Patient is a 29 y.o. female presenting with vomiting. The history is provided by the patient (The patient complains of vomiting and diarrhea for a few days. No blood in her vomit or diarrhea).  Emesis Severity:  Moderate Timing:  Constant Quality:  Bilious material Able to tolerate:  Liquids Progression:  Unchanged Chronicity:  New Associated symptoms: diarrhea   Associated symptoms: no abdominal pain and no headaches     Past Medical History  Diagnosis Date  . Fibroid, uterine   . Fibroid (bleeding) (uterine)   . Menorrhagia   . H/O metrorrhagia    Past Surgical History  Procedure Laterality Date  . Tonsillectomy    . Myomectomy  02/03/2011    Procedure: MYOMECTOMY;  Surgeon: [name], MD;  Location: [facility];  Service: Gynecology;  Laterality: N/A;  . Hysteroscopy w/d&c N/A 09/27/2013    Procedure: DILATATION AND CURETTAGE /HYSTEROSCOPY and insertion of Mirena IUD ;  Surgeon: [name]. Harrington Challenger, MD;  Location: [facility];  Service: Gynecology;  Laterality: N/A;   Family History  Problem Relation Age of Onset  . Anesthesia problems Neg Hx   . Hypotension Neg Hx   . Malignant hyperthermia Neg Hx   . Pseudochol deficiency Neg Hx    Social History  Substance Use Topics  . Smoking status: Never Smoker   . Smokeless tobacco: Never Used  . Alcohol Use: No   OB History    Gravida Para Term Preterm AB TAB SAB Ectopic Multiple Living   1              Review of Systems  Constitutional: Negative for appetite change and fatigue.  HENT: Negative for congestion, ear discharge and sinus pressure.   Eyes: Negative for discharge.  Respiratory: Negative for cough.   Cardiovascular: Negative for chest pain.   Gastrointestinal: Positive for vomiting and diarrhea. Negative for abdominal pain.  Genitourinary: Negative for frequency and hematuria.  Musculoskeletal: Negative for back pain.  Skin: Negative for rash.  Neurological: Negative for seizures and headaches.  Psychiatric/Behavioral: Negative for hallucinations.      Allergies  Review of patient's allergies indicates no known allergies.  Home Medications   Prior to Admission medications   Medication Sig Start Date End Date Taking? Authorizing Provider  ibuprofen (ADVIL,MOTRIN) 200 MG tablet Take 200 mg by mouth every 6 (six) hours as needed for mild pain or moderate pain.   Yes Historical Provider, MD  levonorgestrel (MIRENA) 20 MCG/24HR IUD 1 each by Intrauterine route once.   Yes Historical Provider, MD  cyclobenzaprine (FLEXERIL) 5 MG tablet Take 1 tablet (5 mg total) by mouth 3 (three) times daily as needed for muscle spasms. Patient not taking: Reported on 11/20/2014 11/06/14   Rolland Porter, MD  HYDROcodone-acetaminophen (NORCO/VICODIN) 5-325 MG per tablet Take 1 tablet by mouth every 4 (four) hours as needed. Patient not taking: Reported on 05/01/2015 11/20/14   [name], PA-C  naproxen (NAPROSYN) 500 MG tablet Take 1 tablet (500 mg total) by mouth 2 (two) times daily. Patient not taking: Reported on 11/20/2014 11/06/14   Rolland Porter, MD  ondansetron (ZOFRAN ODT) 4 MG disintegrating tablet 4mg  ODT q4 hours prn nausea/vomit 05/01/15   Milton Ferguson, MD   BP 102/62 mmHg  Pulse 89  Temp(Src) 98.8 F (37.1 C) (Oral)  Resp 14  Ht 5\' 3"  (1.6 m)  Wt 235 lb (106.595 kg)  BMI 41.64 kg/m2  SpO2 99% Physical Exam  Constitutional: She is oriented to person, place, and time. She appears well-developed.  HENT:  Head: Normocephalic.  Eyes: Conjunctivae and EOM are normal. No scleral icterus.  Neck: Neck supple. No thyromegaly present.  Cardiovascular: Normal rate and regular rhythm.  Exam reveals no gallop and no friction rub.   No murmur  heard. Pulmonary/Chest: No stridor. She has no wheezes. She has no rales. She exhibits no tenderness.  Abdominal: She exhibits no distension. There is tenderness. There is no rebound.  Musculoskeletal: Normal range of motion. She exhibits no edema.  Lymphadenopathy:    She has no cervical adenopathy.  Neurological: She is oriented to person, place, and time. She exhibits normal muscle tone. Coordination normal.  Skin: No rash noted. No erythema.  Psychiatric: She has a normal mood and affect. Her behavior is normal.    ED Course  Procedures (including critical care time) Labs Review Labs Reviewed  CBC WITH DIFFERENTIAL/PLATELET  COMPREHENSIVE METABOLIC PANEL    Imaging Review No results found. I have personally reviewed and evaluated these images and lab results as part of my medical decision-making.   EKG Interpretation None      MDM   Final diagnoses:  Gastroenteritis    Labs unremarkable patient improved with IV fluids she's given a prescription of Zofran and will take Tylenol or Motrin for discomfort and will follow-up as needed    Milton Ferguson, MD 05/01/15 2040

## 2015-05-01 NOTE — Discharge Instructions (Signed)
Drink plenty of fluids take Tylenol Motrin for pain and Imodium for diarrhea follow-up next week not improving

## 2015-05-01 NOTE — ED Notes (Signed)
Pt complain of flu like symptoms and n/v

## 2015-06-07 ENCOUNTER — Emergency Department (HOSPITAL_COMMUNITY)
Admission: EM | Admit: 2015-06-07 | Discharge: 2015-06-07 | Disposition: A | Discharge status: Home / Self Care | Payer: Self-pay | Attending: Emergency Medicine | Admitting: Emergency Medicine

## 2015-06-07 ENCOUNTER — Encounter (HOSPITAL_COMMUNITY): Payer: Self-pay | Admitting: Emergency Medicine

## 2015-06-07 DIAGNOSIS — Z86018 Personal history of other benign neoplasm: Secondary | ICD-10-CM | POA: Insufficient documentation

## 2015-06-07 DIAGNOSIS — Z8742 Personal history of other diseases of the female genital tract: Secondary | ICD-10-CM | POA: Insufficient documentation

## 2015-06-07 DIAGNOSIS — B372 Candidiasis of skin and nail: Secondary | ICD-10-CM | POA: Insufficient documentation

## 2015-06-07 MED ORDER — CLOTRIMAZOLE 1 % EX CREA: TOPICAL_CREAM | CUTANEOUS | Status: DC

## 2015-06-07 MED ORDER — TRAMADOL HCL 50 MG PO TABS: 50.0000 mg | ORAL_TABLET | Freq: Four times a day (QID) | ORAL | Status: DC | PRN

## 2015-06-07 NOTE — ED Notes (Signed)
Notice perineal area on Sunday and notice boil on Monday.  Tried warm compresses with no relief.

## 2015-06-07 NOTE — Discharge Instructions (Signed)
Cutaneous Candidiasis °Cutaneous candidiasis is a condition in which there is an overgrowth of yeast (candida) on the skin. Yeast normally live on the skin, but in small enough numbers not to cause any symptoms. In certain cases, increased growth of the yeast may cause an actual yeast infection. This kind of infection usually occurs in areas of the skin that are constantly warm and moist, such as the armpits or the groin. Yeast is the most common cause of diaper rash in babies and in people who cannot control their bowel movements (incontinence). °CAUSES  °The fungus that most often causes cutaneous candidiasis is Candida albicans. Conditions that can increase the risk of getting a yeast infection of the skin include: °· Obesity. °· Pregnancy. °· Diabetes. °· Taking antibiotic medicine. °· Taking birth control pills. °· Taking steroid medicines. °· Thyroid disease. °· An iron or zinc deficiency. °· Problems with the immune system. °SYMPTOMS  °· Red, swollen area of the skin. °· Bumps on the skin. °· Itchiness. °DIAGNOSIS  °The diagnosis of cutaneous candidiasis is usually based on its appearance. Light scrapings of the skin may also be taken and viewed under a microscope to identify the presence of yeast. °TREATMENT  °Antifungal creams may be applied to the infected skin. In severe cases, oral medicines may be needed.  °HOME CARE INSTRUCTIONS  °· Keep your skin clean and dry. °· Maintain a healthy weight. °· If you have diabetes, keep your blood sugar under control. °SEEK IMMEDIATE MEDICAL CARE IF: °· Your rash continues to spread despite treatment. °· You have a fever, chills, or abdominal pain. °  °This information is not intended to replace advice given to you by your health care provider. Make sure you discuss any questions you have with your health care provider. °  °Document Released: 01/05/2011 Document Revised: 07/12/2011 Document Reviewed: 10/21/2014 °Elsevier Interactive Patient Education ©2016 [employer] ° °

## 2015-06-09 NOTE — ED Provider Notes (Signed)
CSN: [id number]     Arrival date & time 06/07/15  1755 History   First MD Initiated Contact with Patient 06/07/15 1816     Chief Complaint  Patient presents with  . Cyst    perineal     (Consider location/radiation/quality/duration/timing/severity/associated sxs/prior Treatment) HPI  Jessica Sanchez is a 30 y.o. female who presents to the Emergency Department complaining of burning pain and swelling of her upper vaginal area and "creases" of her legs.  She describes feeling irritated, raw and swollen with drainage in the folds of her legs.  She was seen by her PMD earlier in the week and started on an antibiotic that she cannot recall the name of.  She has been applying warm wet compresses without relief.  She denies fever, chills, vomiting, abdominal pain, vaginal pain or discharge.     Past Medical History  Diagnosis Date  . Fibroid, uterine   . Fibroid (bleeding) (uterine)   . Menorrhagia   . H/O metrorrhagia    Past Surgical History  Procedure Laterality Date  . Tonsillectomy    . Myomectomy  02/03/2011    Procedure: MYOMECTOMY;  Surgeon: [name], MD;  Location: [facility];  Service: Gynecology;  Laterality: N/A;  . Hysteroscopy w/d&c N/A 09/27/2013    Procedure: DILATATION AND CURETTAGE /HYSTEROSCOPY and insertion of Mirena IUD ;  Surgeon: [name]. Harrington Challenger, MD;  Location: [facility];  Service: Gynecology;  Laterality: N/A;   Family History  Problem Relation Age of Onset  . Anesthesia problems Neg Hx   . Hypotension Neg Hx   . Malignant hyperthermia Neg Hx   . Pseudochol deficiency Neg Hx    Social History  Substance Use Topics  . Smoking status: Never Smoker   . Smokeless tobacco: Never Used  . Alcohol Use: No   OB History    Gravida Para Term Preterm AB TAB SAB Ectopic Multiple Living   1              Review of Systems  Constitutional: Negative for fever, chills, activity change and appetite change.  HENT: Negative for facial swelling, sore throat and trouble  swallowing.   Respiratory: Negative for chest tightness, shortness of breath and wheezing.   Gastrointestinal: Negative for nausea, vomiting and abdominal pain.  Genitourinary: Negative for dysuria, vaginal discharge, difficulty urinating, genital sores and menstrual problem.  Musculoskeletal: Negative for neck pain and neck stiffness.  Skin: Positive for rash. Negative for wound.  Neurological: Negative for dizziness, weakness, numbness and headaches.  All other systems reviewed and are negative.     Allergies  Review of patient's allergies indicates no known allergies.  Home Medications   Prior to Admission medications   Medication Sig Start Date End Date Taking? Authorizing Provider  clotrimazole (LOTRIMIN) 1 % cream Apply to affected area 2 times daily 06/07/15   Maeley Matton, PA-C  cyclobenzaprine (FLEXERIL) 5 MG tablet Take 1 tablet (5 mg total) by mouth 3 (three) times daily as needed for muscle spasms. Patient not taking: Reported on 11/20/2014 11/06/14   Rolland Porter, MD  HYDROcodone-acetaminophen (NORCO/VICODIN) 5-325 MG per tablet Take 1 tablet by mouth every 4 (four) hours as needed. Patient not taking: Reported on 05/01/2015 11/20/14   [name], PA-C  ibuprofen (ADVIL,MOTRIN) 200 MG tablet Take 200 mg by mouth every 6 (six) hours as needed for mild pain or moderate pain.    Historical Provider, MD  levonorgestrel (MIRENA) 20 MCG/24HR IUD 1 each by Intrauterine route once.  Historical Provider, MD  naproxen (NAPROSYN) 500 MG tablet Take 1 tablet (500 mg total) by mouth 2 (two) times daily. Patient not taking: Reported on 11/20/2014 11/06/14   Rolland Porter, MD  ondansetron (ZOFRAN ODT) 4 MG disintegrating tablet 4mg  ODT q4 hours prn nausea/vomit 05/01/15   Milton Ferguson, MD  traMADol (ULTRAM) 50 MG tablet Take 1 tablet (50 mg total) by mouth every 6 (six) hours as needed. 06/07/15   Ardeth Repetto, PA-C   BP 132/82 mmHg  Pulse 98  Temp(Src) 97.8 F (36.6 C) (Oral)  Resp 14  Ht 5'  2" (1.575 m)  Wt 106.595 kg  BMI 42.97 kg/m2  SpO2 100% Physical Exam  Constitutional: She is oriented to person, place, and time. She appears well-developed and well-nourished. No distress.  HENT:  Head: Normocephalic and atraumatic.  Mouth/Throat: Oropharynx is clear and moist.  Neck: Neck supple.  Cardiovascular: Normal rate, regular rhythm and intact distal pulses.   Pulmonary/Chest: Effort normal and breath sounds normal. No respiratory distress.  Abdominal: Soft. She exhibits no distension. There is no tenderness. There is no rebound and no guarding.  Musculoskeletal: Normal range of motion. She exhibits no edema or tenderness.  Lymphadenopathy:    She has no cervical adenopathy.  Neurological: She is alert and oriented to person, place, and time. She exhibits normal muscle tone. Coordination normal.  Skin: Skin is warm. No rash noted. There is erythema.  Erythema and malodorous weeping of the skin folds of the bilateral groin.  No induration, fluctuance or red streaking  Nursing note and vitals reviewed.   ED Course  Procedures (including critical care time) Labs Review Labs Reviewed - No data to display  Imaging Review No results found. I have personally reviewed and evaluated these images and lab results as part of my medical decision-making.   EKG Interpretation None      MDM   Final diagnoses:  Candidal intertrigo    Pt is well appearing.  Vitals stable.  No cellulitis or abscess.  Erythema and malodorous drainage to the skin folds of the groin.  Pt is currently taking antibiotic.  Advised her to keep skin clean and dry as possible.  Rx for clotrimazole cream and she will finish her antibiotic. PMD f/u if needed. Pt also advised to return here for any worsening symptoms    Kem Parkinson, PA-C 06/09/15 [place], DO 06/11/15 [phone]

## 2015-06-30 ENCOUNTER — Emergency Department (HOSPITAL_COMMUNITY): Payer: Self-pay

## 2015-06-30 ENCOUNTER — Emergency Department (HOSPITAL_COMMUNITY)
Admission: EM | Admit: 2015-06-30 | Discharge: 2015-06-30 | Disposition: A | Discharge status: Home / Self Care | Payer: Self-pay | Attending: Emergency Medicine | Admitting: Emergency Medicine

## 2015-06-30 ENCOUNTER — Encounter (HOSPITAL_COMMUNITY): Payer: Self-pay | Admitting: *Deleted

## 2015-06-30 DIAGNOSIS — Z3202 Encounter for pregnancy test, result negative: Secondary | ICD-10-CM | POA: Insufficient documentation

## 2015-06-30 DIAGNOSIS — N3091 Cystitis, unspecified with hematuria: Secondary | ICD-10-CM | POA: Insufficient documentation

## 2015-06-30 DIAGNOSIS — Z86018 Personal history of other benign neoplasm: Secondary | ICD-10-CM | POA: Insufficient documentation

## 2015-06-30 DIAGNOSIS — M549 Dorsalgia, unspecified: Secondary | ICD-10-CM

## 2015-06-30 DIAGNOSIS — R51 Headache: Secondary | ICD-10-CM | POA: Insufficient documentation

## 2015-06-30 DIAGNOSIS — N3090 Cystitis, unspecified without hematuria: Secondary | ICD-10-CM

## 2015-06-30 LAB — CBC
HCT: 40.4 % (ref 36.0–46.0)
Hemoglobin: 13.3 g/dL (ref 12.0–15.0)
MCH: 28.1 pg (ref 26.0–34.0)
MCHC: 32.9 g/dL (ref 30.0–36.0)
MCV: 85.4 fL (ref 78.0–100.0)
PLATELETS: 285 10*3/uL (ref 150–400)
RBC: 4.73 MIL/uL (ref 3.87–5.11)
RDW: 13.8 % (ref 11.5–15.5)
WBC: 7.1 10*3/uL (ref 4.0–10.5)

## 2015-06-30 LAB — COMPREHENSIVE METABOLIC PANEL
ALT: 16 U/L (ref 14–54)
AST: 13 U/L — AB (ref 15–41)
Albumin: 4 g/dL (ref 3.5–5.0)
Alkaline Phosphatase: 56 U/L (ref 38–126)
Anion gap: 8 (ref 5–15)
BUN: 16 mg/dL (ref 6–20)
CHLORIDE: 105 mmol/L (ref 101–111)
CO2: 24 mmol/L (ref 22–32)
CREATININE: 0.68 mg/dL (ref 0.44–1.00)
Calcium: 9.1 mg/dL (ref 8.9–10.3)
GFR calc Af Amer: >60 mL/min (ref >60)
Glucose, Bld: 93 mg/dL (ref 65–99)
Potassium: 4.2 mmol/L (ref 3.5–5.1)
Sodium: 137 mmol/L (ref 135–145)
Total Bilirubin: 0.5 mg/dL (ref 0.3–1.2)
Total Protein: 7.5 g/dL (ref 6.5–8.1)

## 2015-06-30 LAB — URINE MICROSCOPIC-ADD ON: RBC / HPF: NONE SEEN RBC/hpf (ref 0–5)

## 2015-06-30 LAB — URINALYSIS, ROUTINE W REFLEX MICROSCOPIC
Bilirubin Urine: NEGATIVE
GLUCOSE, UA: NEGATIVE mg/dL
KETONES UR: NEGATIVE mg/dL
Leukocytes, UA: NEGATIVE
Nitrite: NEGATIVE
PROTEIN: NEGATIVE mg/dL
Specific Gravity, Urine: >1.03 — ABNORMAL HIGH (ref 1.005–1.030)
pH: 6 (ref 5.0–8.0)

## 2015-06-30 LAB — PREGNANCY, URINE: Preg Test, Ur: NEGATIVE

## 2015-06-30 LAB — LIPASE, BLOOD: LIPASE: 22 U/L (ref 11–51)

## 2015-06-30 MED ORDER — CEPHALEXIN 500 MG PO CAPS: 500.0000 mg | ORAL_CAPSULE | Freq: Three times a day (TID) | ORAL | Status: DC

## 2015-06-30 MED ORDER — KETOROLAC TROMETHAMINE 60 MG/2ML IM SOLN
60.0000 mg | Freq: Once | INTRAMUSCULAR | Status: AC
  Administered 2015-06-30: 60 mg via INTRAMUSCULAR
  Filled 2015-06-30: qty 2

## 2015-06-30 MED ORDER — OXYCODONE-ACETAMINOPHEN 5-325 MG PO TABS
1.0000 | ORAL_TABLET | Freq: Once | ORAL | Status: AC
  Administered 2015-06-30: 1 via ORAL
  Filled 2015-06-30: qty 1

## 2015-06-30 MED ORDER — ONDANSETRON 4 MG PO TBDP
4.0000 mg | ORAL_TABLET | Freq: Once | ORAL | Status: AC
  Administered 2015-06-30: 4 mg via ORAL

## 2015-06-30 MED ORDER — ONDANSETRON 4 MG PO TBDP
ORAL_TABLET | ORAL | Status: AC
  Filled 2015-06-30: qty 1

## 2015-06-30 MED ORDER — CEPHALEXIN 500 MG PO CAPS
500.0000 mg | ORAL_CAPSULE | Freq: Once | ORAL | Status: AC
  Administered 2015-06-30: 500 mg via ORAL
  Filled 2015-06-30: qty 1

## 2015-06-30 MED ORDER — ONDANSETRON HCL 4 MG/2ML IJ SOLN: 4.0000 mg | Freq: Once | INTRAMUSCULAR | Status: DC

## 2015-06-30 MED ORDER — ONDANSETRON 4 MG PO TBDP: 4.0000 mg | ORAL_TABLET | Freq: Three times a day (TID) | ORAL | Status: DC | PRN

## 2015-06-30 NOTE — ED Notes (Signed)
Pt comes in with lower back pain and right side abdominal pain starting yesterday. Pt states she has had x1 episode of emesis today.  Pt is also having a headache that has lasted for a x1 week. No deficits noted in triage.

## 2015-06-30 NOTE — ED Provider Notes (Signed)
CSN: [id number]     Arrival date & time 06/30/15  1802 History  By signing my name below, I, Doran Stabler, attest that this documentation has been prepared under the direction and in the presence of No att. providers found. Electronically Signed: Doran Stabler, ED Scribe. 07/02/2015. 9:54 PM.   Chief Complaint  Patient presents with  . Abdominal Pain   The history is provided by the patient. No language interpreter was used.   HPI Comments: Jessica Sanchez is a 30 y.o. female who presents to the Emergency Department complaining of a  sudden onset of RUQ abdominal pain that began yesterday. Pt also reports right-sided back pain, HA, vomiting x1, and blood in her urine. Pt states her HA has been ongoing for the past 1 week. Pt denies any aggravating or alleviating factors. Pt denies any sore throat, rashes, vaginal discharge, or any other associated symptoms at this time. Pt denies chances of pregnancy.   Past Medical History  Diagnosis Date  . Fibroid, uterine   . Fibroid (bleeding) (uterine)   . Menorrhagia   . H/O metrorrhagia    Past Surgical History  Procedure Laterality Date  . Tonsillectomy    . Myomectomy  02/03/2011    Procedure: MYOMECTOMY;  Surgeon: [name], MD;  Location: [facility];  Service: Gynecology;  Laterality: N/A;  . Hysteroscopy w/d&c N/A 09/27/2013    Procedure: DILATATION AND CURETTAGE /HYSTEROSCOPY and insertion of Mirena IUD ;  Surgeon: [name]. Harrington Challenger, MD;  Location: [facility];  Service: Gynecology;  Laterality: N/A;   Family History  Problem Relation Age of Onset  . Anesthesia problems Neg Hx   . Hypotension Neg Hx   . Malignant hyperthermia Neg Hx   . Pseudochol deficiency Neg Hx    Social History  Substance Use Topics  . Smoking status: Never Smoker   . Smokeless tobacco: Never Used  . Alcohol Use: No   OB History    Gravida Para Term Preterm AB TAB SAB Ectopic Multiple Living   1              Review of Systems  Constitutional: Negative for fever  and chills.  HENT: Negative for ear pain and sore throat.   Gastrointestinal: Positive for vomiting and abdominal pain. Negative for nausea and diarrhea.  Genitourinary: Positive for hematuria. Negative for vaginal discharge.  Musculoskeletal: Positive for back pain.  Skin: Negative for rash.  Neurological: Positive for headaches. Negative for weakness.  All other systems reviewed and are negative.   Allergies  Review of patient's allergies indicates no known allergies.  Home Medications   Prior to Admission medications   Medication Sig Start Date End Date Taking? Authorizing Provider  ibuprofen (ADVIL,MOTRIN) 200 MG tablet Take 200 mg by mouth every 6 (six) hours as needed for mild pain or moderate pain.   Yes Historical Provider, MD  cephALEXin (KEFLEX) 500 MG capsule Take 1 capsule (500 mg total) by mouth 3 (three) times daily. 06/30/15 07/05/15  Merrily Pew, MD  levonorgestrel (MIRENA) 20 MCG/24HR IUD 1 each by Intrauterine route once.    Historical Provider, MD  ondansetron (ZOFRAN-ODT) 4 MG disintegrating tablet Take 1 tablet (4 mg total) by mouth every 8 (eight) hours as needed for nausea or vomiting. 06/30/15   Merrily Pew, MD   BP 125/82 mmHg  Pulse 92  Temp(Src) 98.2 F (36.8 C) (Oral)  Resp 16  Ht 5\' 2"  (1.575 m)  Wt 230 lb (104.327 kg)  BMI 42.06 kg/m2  SpO2 98%  LMP    Physical Exam  Constitutional: She appears well-developed and well-nourished.  HENT:  Head: Normocephalic and atraumatic.  Eyes: EOM are normal. Pupils are equal, round, and reactive to light.  Neck: Normal range of motion. Neck supple.  Cardiovascular: Normal rate, regular rhythm and normal heart sounds.   Pulmonary/Chest: Effort normal and breath sounds normal. No respiratory distress. She has no wheezes. She has no rales.  Abdominal: Soft. Bowel sounds are normal. She exhibits no mass. There is tenderness (RLQ). There is no rebound and no guarding.  Musculoskeletal: She exhibits tenderness (right  CVA).  Skin: No rash noted.   ED Course  Procedures  DIAGNOSTIC STUDIES: Oxygen Saturation is 96% on room air, normal by my interpretation.    COORDINATION OF CARE: 9:05 PM Will give Keflex, Zofran, Percocet and Toradol. Will order CT renal stone study, urinalysis, and blood work. Discussed treatment plan with pt at bedside and pt agreed to plan.   Labs Review Labs Reviewed  COMPREHENSIVE METABOLIC PANEL - Abnormal; Notable for the following:    AST 13 (*)    All other components within normal limits  URINALYSIS, ROUTINE W REFLEX MICROSCOPIC (NOT AT [facility]) - Abnormal; Notable for the following:    Specific Gravity, Urine >1.030 (*)    Hgb urine dipstick LARGE (*)    All other components within normal limits  URINE MICROSCOPIC-ADD ON - Abnormal; Notable for the following:    Squamous Epithelial / LPF 6-30 (*)    Bacteria, UA MANY (*)    All other components within normal limits  URINE CULTURE  LIPASE, BLOOD  CBC  PREGNANCY, URINE    Imaging Review Ct Renal Stone Study  06/30/2015  CLINICAL DATA:  Acute onset of right upper quadrant abdominal pain. Right-sided back pain, headache and vomiting. Hematuria. Initial encounter. EXAM: CT ABDOMEN AND PELVIS WITHOUT CONTRAST TECHNIQUE: Multidetector CT imaging of the abdomen and pelvis was performed following the standard protocol without IV contrast. COMPARISON:  CT of the abdomen and pelvis from 11/06/2014 FINDINGS: The visualized lung bases are clear. The liver and spleen are unremarkable in appearance. The gallbladder is within normal limits. The pancreas and adrenal glands are unremarkable. The kidneys are unremarkable in appearance. There is no evidence of hydronephrosis. No renal or ureteral stones are seen. No perinephric stranding is appreciated. No free fluid is identified. The small bowel is unremarkable in appearance. The stomach is within normal limits. No acute vascular abnormalities are seen. The appendix and is normal in  caliber, without evidence of appendicitis. The colon is unremarkable in appearance. The bladder is patient is mildly distended and grossly unremarkable. The uterus is grossly unremarkable. Intrauterine device is noted in expected position at the fundus of uterus. The ovaries are relatively symmetric. No suspicious adnexal masses are seen. No inguinal lymphadenopathy is seen. No acute osseous abnormalities are identified. IMPRESSION: No acute abnormality seen within the abdomen or pelvis. Electronically Signed   By: Garald Balding M.D.   On: 06/30/2015 22:25   I have personally reviewed and evaluated these images and lab results as part of my medical decision-making.   EKG Interpretation None      MDM   Final diagnoses:  Back pain  Cystitis    Here with likely early pyelo. No e/o kidney stone, possibly passed but does have quite a bit of bacteria in urine s/s c/w UTI. Treated for same. Will rtn for new/worsening symptoms.   I personally performed the services described in  this documentation, which was scribed in my presence. The recorded information has been reviewed and is accurate.    Merrily Pew, MD 07/02/15 [phone]

## 2015-07-02 LAB — URINE CULTURE

## 2015-07-04 ENCOUNTER — Emergency Department (HOSPITAL_COMMUNITY)
Admission: EM | Admit: 2015-07-04 | Discharge: 2015-07-04 | Disposition: A | Discharge status: Home / Self Care | Payer: Self-pay | Attending: Emergency Medicine | Admitting: Emergency Medicine

## 2015-07-04 ENCOUNTER — Encounter (HOSPITAL_COMMUNITY): Payer: Self-pay | Admitting: Emergency Medicine

## 2015-07-04 DIAGNOSIS — Z8742 Personal history of other diseases of the female genital tract: Secondary | ICD-10-CM | POA: Insufficient documentation

## 2015-07-04 DIAGNOSIS — R1031 Right lower quadrant pain: Secondary | ICD-10-CM | POA: Insufficient documentation

## 2015-07-04 DIAGNOSIS — Z86018 Personal history of other benign neoplasm: Secondary | ICD-10-CM | POA: Insufficient documentation

## 2015-07-04 DIAGNOSIS — R51 Headache: Secondary | ICD-10-CM | POA: Insufficient documentation

## 2015-07-04 DIAGNOSIS — R300 Dysuria: Secondary | ICD-10-CM | POA: Insufficient documentation

## 2015-07-04 DIAGNOSIS — R319 Hematuria, unspecified: Secondary | ICD-10-CM | POA: Insufficient documentation

## 2015-07-04 DIAGNOSIS — Z3202 Encounter for pregnancy test, result negative: Secondary | ICD-10-CM | POA: Insufficient documentation

## 2015-07-04 DIAGNOSIS — Z9889 Other specified postprocedural states: Secondary | ICD-10-CM | POA: Insufficient documentation

## 2015-07-04 LAB — PREGNANCY, URINE: Preg Test, Ur: NEGATIVE

## 2015-07-04 LAB — URINE MICROSCOPIC-ADD ON: WBC, UA: NONE SEEN WBC/hpf (ref 0–5)

## 2015-07-04 LAB — URINALYSIS, ROUTINE W REFLEX MICROSCOPIC
Bilirubin Urine: NEGATIVE
GLUCOSE, UA: NEGATIVE mg/dL
KETONES UR: NEGATIVE mg/dL
Leukocytes, UA: NEGATIVE
Nitrite: NEGATIVE
PROTEIN: NEGATIVE mg/dL
Specific Gravity, Urine: 1.015 (ref 1.005–1.030)
pH: 7.5 (ref 5.0–8.0)

## 2015-07-04 MED ORDER — PROMETHAZINE HCL 25 MG PO TABS: 25.0000 mg | ORAL_TABLET | Freq: Four times a day (QID) | ORAL | Status: DC | PRN

## 2015-07-04 MED ORDER — ONDANSETRON HCL 4 MG/2ML IJ SOLN
4.0000 mg | Freq: Once | INTRAMUSCULAR | Status: AC
  Administered 2015-07-04: 4 mg via INTRAVENOUS
  Filled 2015-07-04: qty 2

## 2015-07-04 MED ORDER — HYDROMORPHONE HCL 1 MG/ML IJ SOLN
1.0000 mg | Freq: Once | INTRAMUSCULAR | Status: AC
Start: 2015-07-04 — End: 2015-07-04
  Administered 2015-07-04: 1 mg via INTRAVENOUS
  Filled 2015-07-04: qty 1

## 2015-07-04 MED ORDER — DEXTROSE 5 % IV SOLN
1.0000 g | Freq: Once | INTRAVENOUS | Status: AC
  Administered 2015-07-04: 1 g via INTRAVENOUS
  Filled 2015-07-04: qty 10

## 2015-07-04 MED ORDER — CEPHALEXIN 500 MG PO CAPS: 500.0000 mg | ORAL_CAPSULE | Freq: Four times a day (QID) | ORAL | Status: DC

## 2015-07-04 MED ORDER — TRAMADOL HCL 50 MG PO TABS: 50.0000 mg | ORAL_TABLET | Freq: Four times a day (QID) | ORAL | Status: DC | PRN

## 2015-07-04 NOTE — Discharge Instructions (Signed)
Drink plenty of fluids and follow-up with Dr. Lorriane Shire and She finished the antibiotic

## 2015-07-04 NOTE — ED Notes (Signed)
Pt is tender over frontal sinuses to palpation Education: WHO study of humming to relieve sinus pressure and facilitate movement of mucous. Tender to R abd area- cannot report last BM

## 2015-07-04 NOTE — ED Provider Notes (Signed)
CSN: [id number]     Arrival date & time 07/04/15  1045 History  By signing my name below, I, Terressa Koyanagi, attest that this documentation has been prepared under the direction and in the presence of Milton Ferguson, MD. Electronically Signed: Terressa Koyanagi, ED Scribe. 07/04/2015. 12:00 PM.   Chief Complaint  Patient presents with  . Headache  . Abdominal Pain   Patient is a 30 y.o. female presenting with headaches and abdominal pain. The history is provided by the patient. No language interpreter was used.  Headache Pain location:  L temporal Quality: aching. Radiates to:  Does not radiate Severity currently:  10/10 Severity at highest:  10/10 Duration:  1 week Timing:  Intermittent Associated symptoms: abdominal pain   Associated symptoms: no back pain, no congestion, no cough, no diarrhea, no fatigue, no seizures and no sinus pressure   Abdominal pain:    Location:  LLQ   Quality: aching     Severity:  Severe   Duration:  1 week   Timing:  Intermittent   Progression:  Unchanged   Chronicity:  New Abdominal Pain Associated symptoms: dysuria and hematuria   Associated symptoms: no chest pain, no cough, no diarrhea and no fatigue    PCP: Maricela Curet, MD HPI Comments: Jessica Sanchez is a 30 y.o. female who presents to the Emergency Department with multiple complaints. First, pt complains of RLQ abd pain onset one week ago. Pt also complains of ongoing dysuria with associated hematuria onset one week ago. Pt was seen for her abd pain and UTI Sx at the ED on 06/30/15 whereby pt was started on antibiotics. However, pt denies starting the antibiotics as directed due to potential cost. Pt also complains of severe, intermittent, left, frontal headache onset one week ago.  Past Medical History  Diagnosis Date  . Fibroid, uterine   . Fibroid (bleeding) (uterine)   . Menorrhagia   . H/O metrorrhagia    Past Surgical History  Procedure Laterality Date  . Tonsillectomy    .  Myomectomy  02/03/2011    Procedure: MYOMECTOMY;  Surgeon: [name], MD;  Location: [facility];  Service: Gynecology;  Laterality: N/A;  . Hysteroscopy w/d&c N/A 09/27/2013    Procedure: DILATATION AND CURETTAGE /HYSTEROSCOPY and insertion of Mirena IUD ;  Surgeon: [name]. Harrington Challenger, MD;  Location: [facility];  Service: Gynecology;  Laterality: N/A;   Family History  Problem Relation Age of Onset  . Anesthesia problems Neg Hx   . Hypotension Neg Hx   . Malignant hyperthermia Neg Hx   . Pseudochol deficiency Neg Hx    Social History  Substance Use Topics  . Smoking status: Never Smoker   . Smokeless tobacco: Never Used  . Alcohol Use: No   OB History    Gravida Para Term Preterm AB TAB SAB Ectopic Multiple Living   1              Review of Systems  Constitutional: Negative for appetite change and fatigue.  HENT: Negative for congestion, ear discharge and sinus pressure.   Eyes: Negative for discharge.  Respiratory: Negative for cough.   Cardiovascular: Negative for chest pain.  Gastrointestinal: Positive for abdominal pain. Negative for diarrhea.  Genitourinary: Positive for dysuria and hematuria. Negative for frequency.  Musculoskeletal: Negative for back pain.  Skin: Negative for rash.  Neurological: Positive for headaches. Negative for seizures.  Psychiatric/Behavioral: Negative for hallucinations.      Allergies  Review of patient's allergies indicates  no known allergies.  Home Medications   Prior to Admission medications   Medication Sig Start Date End Date Taking? Authorizing Provider  ibuprofen (ADVIL,MOTRIN) 200 MG tablet Take 200 mg by mouth every 6 (six) hours as needed for mild pain or moderate pain.   Yes Historical Provider, MD  levonorgestrel (MIRENA) 20 MCG/24HR IUD 1 each by Intrauterine route once.   Yes Historical Provider, MD  cephALEXin (KEFLEX) 500 MG capsule Take 1 capsule (500 mg total) by mouth 3 (three) times daily. 06/30/15 07/05/15  Merrily Pew, MD   ondansetron (ZOFRAN-ODT) 4 MG disintegrating tablet Take 1 tablet (4 mg total) by mouth every 8 (eight) hours as needed for nausea or vomiting. 06/30/15   Merrily Pew, MD   Triage Vitals: BP 125/77 mmHg  Pulse 92  Temp(Src) 98.3 F (36.8 C) (Oral)  Resp 20  Ht 5\' 2"  (1.575 m)  Wt 240 lb (108.863 kg)  BMI 43.89 kg/m2  SpO2 100% Physical Exam  Constitutional: She is oriented to person, place, and time. She appears well-developed.  HENT:  Head: Normocephalic.  Eyes: Conjunctivae and EOM are normal. No scleral icterus.  Neck: Neck supple. No thyromegaly present.  Cardiovascular: Normal rate and regular rhythm.  Exam reveals no gallop and no friction rub.   No murmur heard. Pulmonary/Chest: No stridor. She has no wheezes. She has no rales. She exhibits no tenderness.  Abdominal: She exhibits no distension. There is tenderness in the right lower quadrant. There is no rebound.  Musculoskeletal: Normal range of motion. She exhibits no edema.  Lymphadenopathy:    She has no cervical adenopathy.  Neurological: She is oriented to person, place, and time. She exhibits normal muscle tone. Coordination normal.  Skin: No rash noted. No erythema.  Psychiatric: She has a normal mood and affect. Her behavior is normal.    ED Course  Procedures (including critical care time) DIAGNOSTIC STUDIES: Oxygen Saturation is 100% on ra, nl by my interpretation.    COORDINATION OF CARE: 12:00 PM: Discussed treatment plan which includes UA, meds, labs with pt at bedside; patient verbalizes understanding and agrees with treatment plan.  Labs Review Labs Reviewed  URINALYSIS, ROUTINE W REFLEX MICROSCOPIC (NOT AT [facility]) - Abnormal; Notable for the following:    Hgb urine dipstick MODERATE (*)    All other components within normal limits  URINE MICROSCOPIC-ADD ON - Abnormal; Notable for the following:    Squamous Epithelial / LPF 0-5 (*)    Bacteria, UA FEW (*)    All other components within normal limits   PREGNANCY, URINE    Imaging Review No results found. I have personally reviewed and evaluated these lab results as part of my medical decision-making.    MDM   Final diagnoses:  None   Patient with hematuria. She had a CT scan of her abdomen that was unremarkable last week. She also states she is not on her menses now. Patient will be treated for urinary tract infection. Patient will be given Keflex Ultram and Phenergan and is told to follow-up with her PCP  Milton Ferguson, MD 07/04/15 1414

## 2015-07-04 NOTE — ED Notes (Signed)
Pt reports persistent HA x 7 days. Pt states she began having nasal congestion. Pt also reports on-going abdominal pain and hematuria. Pt recently seen in ED for UTI/hematuria, but states she could not afford to get medication filled. States pain has not improved. Denies n/v/d.

## 2015-07-06 LAB — URINE CULTURE: SPECIAL REQUESTS: NORMAL

## 2015-07-09 ENCOUNTER — Encounter (HOSPITAL_COMMUNITY): Payer: Self-pay | Admitting: Emergency Medicine

## 2015-07-09 ENCOUNTER — Emergency Department (HOSPITAL_COMMUNITY)
Admission: EM | Admit: 2015-07-09 | Discharge: 2015-07-09 | Disposition: A | Discharge status: Home / Self Care | Payer: Self-pay | Attending: Emergency Medicine | Admitting: Emergency Medicine

## 2015-07-09 DIAGNOSIS — R300 Dysuria: Secondary | ICD-10-CM | POA: Insufficient documentation

## 2015-07-09 DIAGNOSIS — J3489 Other specified disorders of nose and nasal sinuses: Secondary | ICD-10-CM | POA: Insufficient documentation

## 2015-07-09 DIAGNOSIS — J029 Acute pharyngitis, unspecified: Secondary | ICD-10-CM | POA: Insufficient documentation

## 2015-07-09 DIAGNOSIS — B349 Viral infection, unspecified: Secondary | ICD-10-CM | POA: Insufficient documentation

## 2015-07-09 DIAGNOSIS — Z79899 Other long term (current) drug therapy: Secondary | ICD-10-CM | POA: Insufficient documentation

## 2015-07-09 MED ORDER — IBUPROFEN 800 MG PO TABS
800.0000 mg | ORAL_TABLET | Freq: Once | ORAL | Status: AC
  Administered 2015-07-09: 800 mg via ORAL
  Filled 2015-07-09: qty 1

## 2015-07-09 MED ORDER — CEPHALEXIN 500 MG PO CAPS
500.0000 mg | ORAL_CAPSULE | Freq: Once | ORAL | Status: AC
  Administered 2015-07-09: 500 mg via ORAL
  Filled 2015-07-09: qty 1

## 2015-07-09 MED ORDER — MAGIC MOUTHWASH W/LIDOCAINE: 10.0000 mL | Freq: Three times a day (TID) | ORAL | Status: DC | PRN

## 2015-07-09 NOTE — ED Provider Notes (Signed)
CSN: [id number]     Arrival date & time 07/09/15  1819 History   First MD Initiated Contact with Patient 07/09/15 1848     Chief Complaint  Patient presents with  . Generalized Body Aches     (Consider location/radiation/quality/duration/timing/severity/associated sxs/prior Treatment) HPI  Jessica Sanchez is a 30 y.o. female who presents to the Emergency Department complaining of generalized body aches.  She was seen here few days ago and treated for UTI.  She states that she was prescribed an antibiotic, but has not gotten it filled yet.  She returns complaining of cough chills, and ear pain.  She also reports family members with similar symptoms.  She denies abd pain, vomiting, shortness of breath.   Past Medical History  Diagnosis Date  . Fibroid, uterine   . Fibroid (bleeding) (uterine)   . Menorrhagia   . H/O metrorrhagia    Past Surgical History  Procedure Laterality Date  . Tonsillectomy    . Myomectomy  02/03/2011    Procedure: MYOMECTOMY;  Surgeon: [name], MD;  Location: [facility];  Service: Gynecology;  Laterality: N/A;  . Hysteroscopy w/d&c N/A 09/27/2013    Procedure: DILATATION AND CURETTAGE /HYSTEROSCOPY and insertion of Mirena IUD ;  Surgeon: [name]. Harrington Challenger, MD;  Location: [facility];  Service: Gynecology;  Laterality: N/A;   Family History  Problem Relation Age of Onset  . Anesthesia problems Neg Hx   . Hypotension Neg Hx   . Malignant hyperthermia Neg Hx   . Pseudochol deficiency Neg Hx    Social History  Substance Use Topics  . Smoking status: Never Smoker   . Smokeless tobacco: Never Used  . Alcohol Use: No   OB History    Gravida Para Term Preterm AB TAB SAB Ectopic Multiple Living   1              Review of Systems  Constitutional: Negative for fever, chills, activity change and appetite change.  HENT: Positive for congestion and rhinorrhea. Negative for facial swelling, sore throat and trouble swallowing.   Eyes: Negative for visual disturbance.   Respiratory: Positive for cough. Negative for shortness of breath, wheezing and stridor.   Gastrointestinal: Negative for nausea and vomiting.  Genitourinary: Positive for dysuria. Negative for flank pain and vaginal bleeding.  Musculoskeletal: Negative for neck pain and neck stiffness.  Skin: Negative.   Neurological: Negative for dizziness, weakness, numbness and headaches.  Hematological: Negative for adenopathy.  Psychiatric/Behavioral: Negative for confusion.  All other systems reviewed and are negative.     Allergies  Review of patient's allergies indicates no known allergies.  Home Medications   Prior to Admission medications   Medication Sig Start Date End Date Taking? Authorizing Provider  cephALEXin (KEFLEX) 500 MG capsule Take 1 capsule (500 mg total) by mouth 4 (four) times daily. 07/04/15   Milton Ferguson, MD  ibuprofen (ADVIL,MOTRIN) 200 MG tablet Take 200 mg by mouth every 6 (six) hours as needed for mild pain or moderate pain.    Historical Provider, MD  levonorgestrel (MIRENA) 20 MCG/24HR IUD 1 each by Intrauterine route once.    Historical Provider, MD  magic mouthwash w/lidocaine SOLN Take 10 mLs by mouth 3 (three) times daily as needed for mouth pain. Swish and spit, do not swallow 07/09/15   Margee Trentham, PA-C  ondansetron (ZOFRAN-ODT) 4 MG disintegrating tablet Take 1 tablet (4 mg total) by mouth every 8 (eight) hours as needed for nausea or vomiting. 06/30/15   Corene Cornea  Mesner, MD  promethazine (PHENERGAN) 25 MG tablet Take 1 tablet (25 mg total) by mouth every 6 (six) hours as needed for nausea or vomiting. 07/04/15   Milton Ferguson, MD  traMADol (ULTRAM) 50 MG tablet Take 1 tablet (50 mg total) by mouth every 6 (six) hours as needed. 07/04/15   Milton Ferguson, MD   BP 119/69 mmHg  Pulse 94  Temp(Src) 98.9 F (37.2 C) (Oral)  Resp 16  Ht 5\' 2"  (1.575 m)  Wt 108.863 kg  BMI 43.89 kg/m2  SpO2 100% Physical Exam  Constitutional: She appears well-developed and  well-nourished. No distress.  HENT:  Head: Normocephalic and atraumatic.  Mouth/Throat: Uvula is midline and mucous membranes are normal. Posterior oropharyngeal erythema present. No oropharyngeal exudate, posterior oropharyngeal edema or tonsillar abscesses.  Neck: Normal range of motion.  Cardiovascular: Normal rate and regular rhythm.   No murmur heard. Pulmonary/Chest: Effort normal and breath sounds normal. No respiratory distress.  Abdominal: Soft. She exhibits no distension. There is no tenderness. There is no rebound and no guarding.  No cva tenderness  Musculoskeletal: Normal range of motion.  Neurological: She is alert. She exhibits normal muscle tone. Coordination normal.  Skin: Skin is warm and dry. No rash noted.  Psychiatric: She has a normal mood and affect.  Nursing note and vitals reviewed.   ED Course  Procedures (including critical care time) Labs Review Labs Reviewed - No data to display  Imaging Review No results found. I have personally reviewed and evaluated these images and lab results as part of my medical decision-making.   EKG Interpretation None      MDM   Final diagnoses:  Pharyngitis  Viral illness    Pt seen here 5 days ago for hematuria.  Has not gotten her keflex rx filled.  I have advised her of importance of medication and risks of worsening infection.  rx for magic mouthwash for pharyngitis.  No concerning sx's for strep.    Kem Parkinson, PA-C 07/12/15 [place], MD 07/15/15 [phone]

## 2015-07-09 NOTE — ED Notes (Signed)
Pt states she continues to be congested with cough, chills, aches, and otalgia.  Entire family has same symptoms.

## 2015-07-09 NOTE — Discharge Instructions (Signed)
Upper Respiratory Infection, Adult Most upper respiratory infections (URIs) are caused by a virus. A URI affects the nose, throat, and upper air passages. The most common type of URI is often called "the common cold." HOME CARE   Take medicines only as told by your doctor.  Gargle warm saltwater or take cough drops to comfort your throat as told by your doctor.  Use a warm mist humidifier or inhale steam from a shower to increase air moisture. This may make it easier to breathe.  Drink enough fluid to keep your pee (urine) clear or pale yellow.  Eat soups and other clear broths.  Have a healthy diet.  Rest as needed.  Go back to work when your fever is gone or your doctor says it is okay.  You may need to stay home longer to avoid giving your URI to others.  You can also wear a face mask and wash your hands often to prevent spread of the virus.  Use your inhaler more if you have asthma.  Do not use any tobacco products, including cigarettes, chewing tobacco, or electronic cigarettes. If you need help quitting, ask your doctor. GET HELP IF:  You are getting worse, not better.  Your symptoms are not helped by medicine.  You have chills.  You are getting more short of breath.  You have Tremaine or red mucus.  You have yellow or Evilsizer discharge from your nose.  You have pain in your face, especially when you bend forward.  You have a fever.  You have puffy (swollen) neck glands.  You have pain while swallowing.  You have white areas in the back of your throat. GET HELP RIGHT AWAY IF:   You have very bad or constant:  Headache.  Ear pain.  Pain in your forehead, behind your eyes, and over your cheekbones (sinus pain).  Chest pain.  You have long-lasting (chronic) lung disease and any of the following:  Wheezing.  Long-lasting cough.  Coughing up blood.  A change in your usual mucus.  You have a stiff neck.  You have changes in  your:  Vision.  Hearing.  Thinking.  Mood. MAKE SURE YOU:   Understand these instructions.  Will watch your condition.  Will get help right away if you are not doing well or get worse.   This information is not intended to replace advice given to you by your health care provider. Make sure you discuss any questions you have with your health care provider.   Document Released: 10/06/2007 Document Revised: 09/03/2014 Document Reviewed: 07/25/2013 Elsevier Interactive Patient Education 2016 [employer]  

## 2015-07-11 ENCOUNTER — Encounter: Payer: Self-pay | Admitting: Adult Health

## 2015-07-11 ENCOUNTER — Ambulatory Visit (INDEPENDENT_AMBULATORY_CARE_PROVIDER_SITE_OTHER): Payer: Self-pay | Admitting: Adult Health

## 2015-07-11 VITALS — BP 110/80 | HR 74 | Ht 63.0 in | Wt 263.0 lb

## 2015-07-11 DIAGNOSIS — Z975 Presence of (intrauterine) contraceptive device: Secondary | ICD-10-CM | POA: Insufficient documentation

## 2015-07-11 DIAGNOSIS — Z30431 Encounter for routine checking of intrauterine contraceptive device: Secondary | ICD-10-CM

## 2015-07-11 HISTORY — DX: Presence of (intrauterine) contraceptive device: Z97.5

## 2015-07-11 NOTE — Progress Notes (Signed)
Subjective:     Patient ID: Jessica Sanchez, female   DOB: 08/12/1985, 30 y.o.   MRN: [id number]  HPI Jessica Sanchez is a 30 year old black female in because she can't feel IUD strings, she says she got it about May 2014 and spots on it no real period.Has had URI and sees Dr Cindie Laroche, was seen in ER recently for abdominal pain and headache.Has had same partner for 7 months.  Review of Systems Patient denies any daily headaches, hearing loss, fatigue, blurred vision, shortness of breath, chest pain, abdominal pain, problems with bowel movements, urination, or intercourse. No joint pain or mood swings.See HPI for positives. Reviewed past medical,surgical, social and family history. Reviewed medications and allergies.     Objective:   Physical Exam BP 110/80 mmHg  Pulse 74  Ht 5\' 3"  (1.6 m)  Wt 263 lb (119.296 kg)  BMI 46.60 kg/m2   Skin warm and dry.Pelvic: external genitalia is normal in appearance no lesions, vagina: tan discharge without odor,urethra has no lesions or masses noted, cervix:smooth and nulliparous, +IUD string,they are short, uterus: normal size, shape and contour, non tender, no masses felt, adnexa: no masses or tenderness noted. Bladder is non tender and no masses felt.  Assessment:     IUD in place    Plan:     Use condoms Apply for family planning medicaid then make appt for pap and physical

## 2015-07-11 NOTE — Patient Instructions (Signed)
Apply for family planning medicaid Make pap and physical appt then Use condoms

## 2015-08-18 ENCOUNTER — Encounter (HOSPITAL_COMMUNITY): Payer: Self-pay | Admitting: Emergency Medicine

## 2015-08-18 ENCOUNTER — Emergency Department (HOSPITAL_COMMUNITY)
Admission: EM | Admit: 2015-08-18 | Discharge: 2015-08-18 | Disposition: A | Discharge status: Home / Self Care | Payer: Self-pay | Attending: Emergency Medicine | Admitting: Emergency Medicine

## 2015-08-18 DIAGNOSIS — B372 Candidiasis of skin and nail: Secondary | ICD-10-CM | POA: Insufficient documentation

## 2015-08-18 DIAGNOSIS — R21 Rash and other nonspecific skin eruption: Secondary | ICD-10-CM

## 2015-08-18 DIAGNOSIS — Z791 Long term (current) use of non-steroidal anti-inflammatories (NSAID): Secondary | ICD-10-CM | POA: Insufficient documentation

## 2015-08-18 LAB — URINALYSIS, ROUTINE W REFLEX MICROSCOPIC
Bilirubin Urine: NEGATIVE
Glucose, UA: NEGATIVE mg/dL
Ketones, ur: NEGATIVE mg/dL
Leukocytes, UA: NEGATIVE
Nitrite: NEGATIVE
Protein, ur: NEGATIVE mg/dL
Specific Gravity, Urine: 1.025 (ref 1.005–1.030)
pH: 5.5 (ref 5.0–8.0)

## 2015-08-18 LAB — URINE MICROSCOPIC-ADD ON

## 2015-08-18 LAB — CBG MONITORING, ED: Glucose-Capillary: 72 mg/dL (ref 65–99)

## 2015-08-18 LAB — PREGNANCY, URINE: PREG TEST UR: NEGATIVE

## 2015-08-18 MED ORDER — KETOROLAC TROMETHAMINE 60 MG/2ML IM SOLN
60.0000 mg | Freq: Once | INTRAMUSCULAR | Status: AC
  Administered 2015-08-18: 60 mg via INTRAMUSCULAR
  Filled 2015-08-18: qty 2

## 2015-08-18 MED ORDER — IBUPROFEN 800 MG PO TABS
800.0000 mg | ORAL_TABLET | Freq: Once | ORAL | Status: AC
  Administered 2015-08-18: 800 mg via ORAL
  Filled 2015-08-18: qty 1

## 2015-08-18 MED ORDER — CEPHALEXIN 500 MG PO CAPS: 500.0000 mg | ORAL_CAPSULE | Freq: Four times a day (QID) | ORAL | Status: DC

## 2015-08-18 MED ORDER — CLOTRIMAZOLE 1 % EX CREA: TOPICAL_CREAM | CUTANEOUS | Status: DC

## 2015-08-18 MED ORDER — HYDROCORTISONE 1 % EX CREA: TOPICAL_CREAM | CUTANEOUS | Status: DC

## 2015-08-18 NOTE — ED Provider Notes (Signed)
CSN: [id number]     Arrival date & time 08/18/15  1541 History  By signing my name below, I, Irene Pap, attest that this documentation has been prepared under the direction and in the presence of Ezequiel Essex, MD. Electronically Signed: Irene Pap, ED Scribe. 08/18/2015. 8:08 PM.  Chief Complaint  Patient presents with  . Rash   The history is provided by the patient. No language interpreter was used.  HPI Comments: Jessica Sanchez is a 30 y.o. female who presents to the Emergency Department complaining of a gradually worsening, itching rash to the face, bilateral arms and abdomen onset one week ago. She reports associated bilateral ankle swelling and an irritated area of skin from the right lower abdomen to the right groin. Pt reports using hydrocortisone cream and benadryl x1 to no relief. She reports a headache with associated sound sensitivity and photophobia onset 3 days ago. Pt reports fever tmax 100 F yesterday. She states that this is typical of her past migraines. She has been taking Excedrin for the headache to no relief. Pt's last dose of Excedrin was earlier today. Pt uses IUD for [facility] but has not had a menstrual period in a "long time." She does not know if there is a chance of pregnancy. She denies use of new soaps, lotions, detergents, foods, or medications. She denies coughing, wheezing, sore throat, chest pain, SOB, vomiting, or diarrhea. She denies allergies to medications.   PCP: Maricela Curet, MD   Past Medical History  Diagnosis Date  . Fibroid, uterine   . Fibroid (bleeding) (uterine)   . Menorrhagia   . H/O metrorrhagia   . IUD (intrauterine device) in place 07/11/2015   Past Surgical History  Procedure Laterality Date  . Tonsillectomy    . Myomectomy  02/03/2011    Procedure: MYOMECTOMY;  Surgeon: [name], MD;  Location: [facility];  Service: Gynecology;  Laterality: N/A;  . Hysteroscopy w/d&c N/A 09/27/2013    Procedure: DILATATION AND CURETTAGE  /HYSTEROSCOPY and insertion of Mirena IUD ;  Surgeon: [name]. Harrington Challenger, MD;  Location: [facility];  Service: Gynecology;  Laterality: N/A;   Family History  Problem Relation Age of Onset  . Anesthesia problems Neg Hx   . Hypotension Neg Hx   . Malignant hyperthermia Neg Hx   . Pseudochol deficiency Neg Hx   . Cirrhosis Mother   . Diabetes Mother   . Cancer Maternal Grandfather   . Hypertension Sister   . Diabetes Sister    Social History  Substance Use Topics  . Smoking status: Never Smoker   . Smokeless tobacco: Never Used  . Alcohol Use: No   OB History    Gravida Para Term Preterm AB TAB SAB Ectopic Multiple Living   1    1  1         Review of Systems A complete 10 system review of systems was obtained and all systems are negative except as noted in the HPI and PMH.   Allergies  Review of patient's allergies indicates no known allergies.  Home Medications   Prior to Admission medications   Medication Sig Start Date End Date Taking? Authorizing Provider  cephALEXin (KEFLEX) 500 MG capsule Take 1 capsule (500 mg total) by mouth 4 (four) times daily. 07/04/15   Milton Ferguson, MD  ibuprofen (ADVIL,MOTRIN) 200 MG tablet Take 200 mg by mouth every 6 (six) hours as needed for mild pain or moderate pain.    Historical Provider, MD  levonorgestrel (MIRENA)  20 MCG/24HR IUD 1 each by Intrauterine route once.    Historical Provider, MD  traMADol (ULTRAM) 50 MG tablet Take 1 tablet (50 mg total) by mouth every 6 (six) hours as needed. 07/04/15   Milton Ferguson, MD   BP 120/73 mmHg  Pulse 99  Temp(Src) 98.4 F (36.9 C) (Oral)  Resp 16  Ht 5\' 3"  (1.6 m)  Wt 254 lb (115.214 kg)  BMI 45.01 kg/m2  SpO2 100% Physical Exam  Constitutional: She is oriented to person, place, and time. She appears well-developed and well-nourished. No distress.  HENT:  Head: Normocephalic and atraumatic.  Mouth/Throat: Oropharynx is clear and moist. No oropharyngeal exudate.  no oral lesions  Eyes:  Conjunctivae and EOM are normal. Pupils are equal, round, and reactive to light.  Neck: Normal range of motion. Neck supple.  No meningismus.  Cardiovascular: Normal rate, regular rhythm, normal heart sounds and intact distal pulses.   No murmur heard. Pulmonary/Chest: Effort normal and breath sounds normal. No respiratory distress.  Abdominal: Soft. There is no tenderness. There is no rebound and no guarding.  Musculoskeletal: Normal range of motion. She exhibits no edema or tenderness.  Neurological: She is alert and oriented to person, place, and time. No cranial nerve deficit. She exhibits normal muscle tone. Coordination normal.  5/5 strength throughout. CN 2-12 intact.Equal grip strength. Sensation intact.   Skin: Skin is warm.  Right groin with erythema with small area of skin breakdown on right lower panus; scattered erythematous papules to the forearms and face; no lesions between fingers and toes; no cellulitis or abscess  Psychiatric: She has a normal mood and affect. Her behavior is normal.  Nursing note and vitals reviewed.   ED Course  Procedures (including critical care time) DIAGNOSTIC STUDIES: Oxygen Saturation is 100% on RA, normal by my interpretation.    COORDINATION OF CARE: 8:04 PM-Discussed treatment plan which includes labs with pt at bedside and pt agreed to plan.    Labs Review Labs Reviewed  URINALYSIS, ROUTINE W REFLEX MICROSCOPIC (NOT AT [facility]) - Abnormal; Notable for the following:    Hgb urine dipstick LARGE (*)    All other components within normal limits  URINE MICROSCOPIC-ADD ON - Abnormal; Notable for the following:    Squamous Epithelial / LPF 6-30 (*)    Bacteria, UA FEW (*)    All other components within normal limits  PREGNANCY, URINE  CBG MONITORING, ED    Imaging Review No results found. I have personally reviewed and evaluated these images and lab results as part of my medical decision-making.   EKG Interpretation None      MDM    Final diagnoses:  Candidal intertrigo  Rash   1 week of itching rash to face and arms. No new exposures. Area of candidiasis skin breakdown to right groin fold. There is no cellulitis or abscess.  Typical headache similar to previous migraines. No thunderclap onset. Nonfocal neurological exam.  Patient has hematuria which appears to be chronic and his previous workups for the same. We'll treat her rash supportively with anti-histamines. Also we'll treat her apparent candidal infection of her groin. Headache has improved with toradol, reglan, benadryl in the ED.  Low suspicion for [facility] or meningitis. Follow up with PCP. Return precautions discussed.    I personally performed the services described in this documentation, which was scribed in my presence. The recorded information has been reviewed and is accurate.    Ezequiel Essex, MD 08/19/15 [phone]

## 2015-08-18 NOTE — Discharge Instructions (Signed)
Cutaneous Candidiasis Used the yeast medicine on your groin as prescribed. Use the other medication for itching as needed. Return to the ED if you develop new or worsening symptoms. Cutaneous candidiasis is a condition in which there is an overgrowth of yeast (candida) on the skin. Yeast normally live on the skin, but in small enough numbers not to cause any symptoms. In certain cases, increased growth of the yeast may cause an actual yeast infection. This kind of infection usually occurs in areas of the skin that are constantly warm and moist, such as the armpits or the groin. Yeast is the most common cause of diaper rash in babies and in people who cannot control their bowel movements (incontinence). CAUSES  The fungus that most often causes cutaneous candidiasis is Candida albicans. Conditions that can increase the risk of getting a yeast infection of the skin include:  Obesity.  Pregnancy.  Diabetes.  Taking antibiotic medicine.  Taking birth control pills.  Taking steroid medicines.  Thyroid disease.  An iron or zinc deficiency.  Problems with the immune system. SYMPTOMS   Red, swollen area of the skin.  Bumps on the skin.  Itchiness. DIAGNOSIS  The diagnosis of cutaneous candidiasis is usually based on its appearance. Light scrapings of the skin may also be taken and viewed under a microscope to identify the presence of yeast. TREATMENT  Antifungal creams may be applied to the infected skin. In severe cases, oral medicines may be needed.  HOME CARE INSTRUCTIONS   Keep your skin clean and dry.  Maintain a healthy weight.  If you have diabetes, keep your blood sugar under control. SEEK IMMEDIATE MEDICAL CARE IF:  Your rash continues to spread despite treatment.  You have a fever, chills, or abdominal pain.   This information is not intended to replace advice given to you by your health care provider. Make sure you discuss any questions you have with your health care  provider.   Document Released: 01/05/2011 Document Revised: 07/12/2011 Document Reviewed: 10/21/2014 Elsevier Interactive Patient Education [employer].

## 2015-08-18 NOTE — ED Notes (Signed)
Pt alert & oriented x4, stable gait. Patient given discharge instructions, paperwork & prescription(s). Patient  instructed to stop at the registration desk to finish any additional paperwork. Patient verbalized understanding. Pt left department w/ no further questions. 

## 2015-08-18 NOTE — ED Notes (Signed)
Having itching to face, arms and abdomen.  Pt also would like for MD to look at irritated area in fold to right groin.  C/o headache (last 2 days) 10/10.

## 2015-08-18 NOTE — ED Notes (Signed)
Pt states itching & rash that started a week ago. Redness and rash noted to the right lower abdominal area w/ a foul odor.

## 2015-08-31 ENCOUNTER — Encounter (HOSPITAL_COMMUNITY): Payer: Self-pay | Admitting: Emergency Medicine

## 2015-08-31 ENCOUNTER — Emergency Department (HOSPITAL_COMMUNITY)
Admission: EM | Admit: 2015-08-31 | Discharge: 2015-08-31 | Disposition: A | Discharge status: Home / Self Care | Payer: Self-pay | Attending: Emergency Medicine | Admitting: Emergency Medicine

## 2015-08-31 DIAGNOSIS — Y999 Unspecified external cause status: Secondary | ICD-10-CM | POA: Insufficient documentation

## 2015-08-31 DIAGNOSIS — X58XXXA Exposure to other specified factors, initial encounter: Secondary | ICD-10-CM | POA: Insufficient documentation

## 2015-08-31 DIAGNOSIS — Y939 Activity, unspecified: Secondary | ICD-10-CM | POA: Insufficient documentation

## 2015-08-31 DIAGNOSIS — T23102A Burn of first degree of left hand, unspecified site, initial encounter: Secondary | ICD-10-CM | POA: Insufficient documentation

## 2015-08-31 DIAGNOSIS — Y929 Unspecified place or not applicable: Secondary | ICD-10-CM | POA: Insufficient documentation

## 2015-08-31 MED ORDER — IBUPROFEN 800 MG PO TABS
800.0000 mg | ORAL_TABLET | Freq: Once | ORAL | Status: AC
  Administered 2015-08-31: 800 mg via ORAL
  Filled 2015-08-31: qty 1

## 2015-08-31 MED ORDER — ACETAMINOPHEN 325 MG PO TABS
650.0000 mg | ORAL_TABLET | Freq: Once | ORAL | Status: AC
  Administered 2015-08-31: 650 mg via ORAL
  Filled 2015-08-31: qty 2
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
## 2015-08-31 NOTE — Discharge Instructions (Signed)
Burn Care °Burns hurt your skin. When your skin is hurt, it is easier to get an infection. Follow your doctor's directions to help prevent an infection. °HOME CARE °· Wash your hands well before you change your bandage. °· Change your bandage as often as told by your doctor. °¨ Remove the old bandage. If the bandage sticks, soak it off with cool, clean water. °¨ Gently clean the burn with mild soap and water. °¨ Pat the burn dry with a clean, dry cloth. °¨ Put a thin layer of medicated cream on the burn. °¨ Put a clean bandage on as told by your doctor. °¨ Keep the bandage clean and dry. °· Raise (elevate) the burn for the first 24 hours. After that, follow your doctor's directions. °· Only take medicine as told by your doctor. °GET HELP RIGHT AWAY IF:  °· You have too much pain. °· The skin near the burn is red, tender, puffy (swollen), or has red streaks. °· The burn area has yellowish white fluid (pus) or a bad smell coming from it. °· You have a fever. °MAKE SURE YOU:  °· Understand these instructions. °· Will watch your condition. °· Will get help right away if you are not doing well or get worse. °  °This information is not intended to replace advice given to you by your health care provider. Make sure you discuss any questions you have with your health care provider. °  °Document Released: 01/27/2008 Document Revised: 07/12/2011 Document Reviewed: 09/09/2010 °Elsevier Interactive Patient Education ©2016 Elsevier Inc. ° °

## 2015-08-31 NOTE — ED Notes (Signed)
Pt made aware to return if symptoms worsen or if any life threatening symptoms occur.   

## 2015-09-10 ENCOUNTER — Emergency Department (HOSPITAL_COMMUNITY)
Admission: EM | Admit: 2015-09-10 | Discharge: 2015-09-11 | Disposition: A | Discharge status: Home / Self Care | Payer: Self-pay | Attending: Emergency Medicine | Admitting: Emergency Medicine

## 2015-09-10 ENCOUNTER — Encounter (HOSPITAL_COMMUNITY): Payer: Self-pay | Admitting: Emergency Medicine

## 2015-09-10 ENCOUNTER — Emergency Department (HOSPITAL_COMMUNITY): Payer: Self-pay

## 2015-09-10 DIAGNOSIS — R0789 Other chest pain: Secondary | ICD-10-CM | POA: Insufficient documentation

## 2015-09-10 DIAGNOSIS — R319 Hematuria, unspecified: Secondary | ICD-10-CM

## 2015-09-10 DIAGNOSIS — Z79899 Other long term (current) drug therapy: Secondary | ICD-10-CM | POA: Insufficient documentation

## 2015-09-10 DIAGNOSIS — R1084 Generalized abdominal pain: Secondary | ICD-10-CM | POA: Insufficient documentation

## 2015-09-10 LAB — CBC WITH DIFFERENTIAL/PLATELET
BASOS ABS: 0 10*3/uL (ref 0.0–0.1)
Basophils Relative: 0 %
EOS PCT: 1 %
Eosinophils Absolute: 0.1 10*3/uL (ref 0.0–0.7)
HCT: 38.9 % (ref 36.0–46.0)
HEMOGLOBIN: 12.9 g/dL (ref 12.0–15.0)
LYMPHS ABS: 2.9 10*3/uL (ref 0.7–4.0)
LYMPHS PCT: 48 %
MCH: 28.3 pg (ref 26.0–34.0)
MCHC: 33.2 g/dL (ref 30.0–36.0)
MCV: 85.3 fL (ref 78.0–100.0)
Monocytes Absolute: 0.4 10*3/uL (ref 0.1–1.0)
Monocytes Relative: 6 %
NEUTROS PCT: 45 %
Neutro Abs: 2.8 10*3/uL (ref 1.7–7.7)
PLATELETS: 281 10*3/uL (ref 150–400)
RBC: 4.56 MIL/uL (ref 3.87–5.11)
RDW: 14 % (ref 11.5–15.5)
WBC: 6.2 10*3/uL (ref 4.0–10.5)

## 2015-09-10 LAB — BASIC METABOLIC PANEL
ANION GAP: 8 (ref 5–15)
BUN: 8 mg/dL (ref 6–20)
CHLORIDE: 104 mmol/L (ref 101–111)
CO2: 25 mmol/L (ref 22–32)
Calcium: 8.8 mg/dL — ABNORMAL LOW (ref 8.9–10.3)
Creatinine, Ser: 0.73 mg/dL (ref 0.44–1.00)
Glucose, Bld: 91 mg/dL (ref 65–99)
POTASSIUM: 4 mmol/L (ref 3.5–5.1)
SODIUM: 137 mmol/L (ref 135–145)

## 2015-09-10 LAB — URINALYSIS, ROUTINE W REFLEX MICROSCOPIC
BILIRUBIN URINE: NEGATIVE
Glucose, UA: NEGATIVE mg/dL
KETONES UR: NEGATIVE mg/dL
LEUKOCYTES UA: NEGATIVE
NITRITE: NEGATIVE
Protein, ur: NEGATIVE mg/dL
SPECIFIC GRAVITY, URINE: 1.025 (ref 1.005–1.030)
pH: 5.5 (ref 5.0–8.0)

## 2015-09-10 LAB — LIPASE, BLOOD: Lipase: 15 U/L (ref 11–51)

## 2015-09-10 LAB — URINE MICROSCOPIC-ADD ON

## 2015-09-10 LAB — TROPONIN I

## 2015-09-10 LAB — PREGNANCY, URINE: Preg Test, Ur: NEGATIVE

## 2015-09-10 MED ORDER — MORPHINE SULFATE (PF) 4 MG/ML IV SOLN
4.0000 mg | Freq: Once | INTRAVENOUS | Status: AC
  Administered 2015-09-10: 4 mg via INTRAMUSCULAR
  Filled 2015-09-10: qty 1

## 2015-09-10 MED ORDER — ONDANSETRON 8 MG PO TBDP
8.0000 mg | ORAL_TABLET | Freq: Once | ORAL | Status: AC
  Administered 2015-09-10: 8 mg via ORAL
  Filled 2015-09-10: qty 1

## 2015-09-10 NOTE — ED Notes (Signed)
Pt tightness in chest today at work, right arm tingling

## 2015-09-10 NOTE — ED Notes (Signed)
Pt states she has spells of shaking.

## 2015-09-11 MED ORDER — HYDROCODONE-ACETAMINOPHEN 5-325 MG PO TABS: 1.0000 | ORAL_TABLET | ORAL | Status: DC | PRN

## 2015-09-11 MED ORDER — IBUPROFEN 800 MG PO TABS: 800.0000 mg | ORAL_TABLET | Freq: Three times a day (TID) | ORAL | Status: DC

## 2015-09-11 NOTE — ED Notes (Signed)
Patient given discharge instruction, verbalized understand. Patient ambulatory out of the department.  

## 2015-09-11 NOTE — ED Provider Notes (Signed)
CSN: [id number]     Arrival date & time 09/10/15  1947 History   First MD Initiated Contact with Patient 09/10/15 2133     Chief Complaint  Patient presents with  . Chest Pain  PT SAID THAT SHE HAD PAIN IN HER RIGHT CHEST TODAY AT WORK.  SHE ALSO HAS PAIN IN HER RIGHT SIDE.  PT DENIES ANY FEVERS, NO N/V.   (Consider location/radiation/quality/duration/timing/severity/associated sxs/prior Treatment) Patient is a 30 y.o. female presenting with chest pain.  Chest Pain Pain location:  R chest Pain quality: sharp   Pain severity:  Mild Onset quality:  Sudden Timing:  Constant Progression:  Unchanged   Past Medical History  Diagnosis Date  . Fibroid, uterine   . Fibroid (bleeding) (uterine)   . Menorrhagia   . H/O metrorrhagia   . IUD (intrauterine device) in place 07/11/2015   Past Surgical History  Procedure Laterality Date  . Tonsillectomy    . Myomectomy  02/03/2011    Procedure: MYOMECTOMY;  Surgeon: [name], MD;  Location: [facility];  Service: Gynecology;  Laterality: N/A;  . Hysteroscopy w/d&c N/A 09/27/2013    Procedure: DILATATION AND CURETTAGE /HYSTEROSCOPY and insertion of Mirena IUD ;  Surgeon: [name]. Harrington Challenger, MD;  Location: [place] [facility];  Service: Gynecology;  Laterality: N/A;   Family History  Problem Relation Age of Onset  . Anesthesia problems Neg Hx   . Hypotension Neg Hx   . Malignant hyperthermia Neg Hx   . Pseudochol deficiency Neg Hx   . Cirrhosis Mother   . Diabetes Mother   . Cancer Maternal Grandfather   . Hypertension Sister   . Diabetes Sister    Social History  Substance Use Topics  . Smoking status: Never Smoker   . Smokeless tobacco: Never Used  . Alcohol Use: No   OB History    Gravida Para Term Preterm AB TAB SAB Ectopic Multiple Living   1    1  1         Review of Systems  Cardiovascular: Positive for chest pain.  Genitourinary: Positive for flank pain.  All other systems reviewed and are negative.     Allergies  Review of  patient's allergies indicates no known allergies.  Home Medications   Prior to Admission medications   Medication Sig Start Date End Date Taking? Authorizing Provider  levonorgestrel (MIRENA) 20 MCG/24HR IUD 1 each by Intrauterine route once.   Yes Historical Provider, MD  cephALEXin (KEFLEX) 500 MG capsule Take 1 capsule (500 mg total) by mouth 4 (four) times daily. Patient not taking: Reported on 09/10/2015 08/18/15   Ezequiel Essex, MD  clotrimazole (LOTRIMIN) 1 % cream Apply to affected area 2 times daily Patient not taking: Reported on 09/10/2015 08/18/15   Ezequiel Essex, MD  HYDROcodone-acetaminophen (NORCO/VICODIN) 5-325 MG tablet Take 1 tablet by mouth every 4 (four) hours as needed. 09/11/15   Isla Pence, MD  hydrocortisone cream 1 % Apply to affected area 2 times daily Patient not taking: Reported on 09/10/2015 08/18/15   Ezequiel Essex, MD  ibuprofen (ADVIL,MOTRIN) 800 MG tablet Take 1 tablet (800 mg total) by mouth 3 (three) times daily. 09/11/15   Isla Pence, MD  traMADol (ULTRAM) 50 MG tablet Take 1 tablet (50 mg total) by mouth every 6 (six) hours as needed. Patient not taking: Reported on 08/18/2015 07/04/15   Milton Ferguson, MD   BP 104/73 mmHg  Pulse 83  Temp(Src) 98.7 F (37.1 C) (Oral)  Resp 18  Ht 5'  2" (1.575 m)  Wt 250 lb (113.399 kg)  BMI 45.71 kg/m2  SpO2 99% Physical Exam  Constitutional: She is oriented to person, place, and time. She appears well-developed and well-nourished.  HENT:  Head: Normocephalic and atraumatic.  Right Ear: External ear normal.  Left Ear: External ear normal.  Mouth/Throat: Oropharynx is clear and moist.  Eyes: Conjunctivae and EOM are normal. Pupils are equal, round, and reactive to light.  Neck: Normal range of motion. Neck supple.  Cardiovascular: Normal rate, regular rhythm, normal heart sounds and intact distal pulses.   Pulmonary/Chest: Effort normal and breath sounds normal.  Abdominal: Soft. Bowel sounds are normal.   Musculoskeletal: Normal range of motion.  Neurological: She is alert and oriented to person, place, and time.  Skin: Skin is warm and dry.  Psychiatric: She has a normal mood and affect. Her behavior is normal. Judgment and thought content normal.  Nursing note and vitals reviewed.   ED Course  Procedures (including critical care time) Labs Review Labs Reviewed  BASIC METABOLIC PANEL - Abnormal; Notable for the following:    Calcium 8.8 (*)    All other components within normal limits  URINALYSIS, ROUTINE W REFLEX MICROSCOPIC (NOT AT [facility]) - Abnormal; Notable for the following:    Hgb urine dipstick LARGE (*)    All other components within normal limits  URINE MICROSCOPIC-ADD ON - Abnormal; Notable for the following:    Squamous Epithelial / LPF 6-30 (*)    Bacteria, UA FEW (*)    All other components within normal limits  CBC WITH DIFFERENTIAL/PLATELET  TROPONIN I  LIPASE, BLOOD  PREGNANCY, URINE    Imaging Review No results found. I have personally reviewed and evaluated these images and lab results as part of my medical decision-making.   EKG Interpretation   Date/Time:  Wednesday Sep 10 2015 20:06:47 EDT Ventricular Rate:  95 PR Interval:  138 QRS Duration: 90 QT Interval:  364 QTC Calculation: 457 R Axis:   7 Text Interpretation:  Normal sinus rhythm Normal ECG Confirmed by Vetra Shinall  MD, Milani Lowenstein ([id number]) on 09/10/2015 9:07:58 PM      MDM   Final diagnoses:  Atypical chest pain  Generalized abdominal pain    Isla Pence, MD 09/13/15 1244

## 2015-09-11 NOTE — Discharge Instructions (Signed)

## 2015-09-28 ENCOUNTER — Emergency Department (HOSPITAL_COMMUNITY): Payer: Self-pay

## 2015-09-28 ENCOUNTER — Emergency Department (HOSPITAL_COMMUNITY)
Admission: EM | Admit: 2015-09-28 | Discharge: 2015-09-28 | Disposition: A | Discharge status: Home / Self Care | Payer: Self-pay | Attending: Emergency Medicine | Admitting: Emergency Medicine

## 2015-09-28 ENCOUNTER — Encounter (HOSPITAL_COMMUNITY): Payer: Self-pay | Admitting: Emergency Medicine

## 2015-09-28 DIAGNOSIS — M79631 Pain in right forearm: Secondary | ICD-10-CM | POA: Insufficient documentation

## 2015-09-28 DIAGNOSIS — Z975 Presence of (intrauterine) contraceptive device: Secondary | ICD-10-CM | POA: Insufficient documentation

## 2015-09-28 DIAGNOSIS — M79601 Pain in right arm: Secondary | ICD-10-CM

## 2015-09-28 DIAGNOSIS — M6281 Muscle weakness (generalized): Secondary | ICD-10-CM | POA: Insufficient documentation

## 2015-09-28 DIAGNOSIS — R200 Anesthesia of skin: Secondary | ICD-10-CM | POA: Insufficient documentation

## 2015-09-28 LAB — CBC
HCT: 39.7 % (ref 36.0–46.0)
HEMOGLOBIN: 12.7 g/dL (ref 12.0–15.0)
MCH: 27.6 pg (ref 26.0–34.0)
MCHC: 32 g/dL (ref 30.0–36.0)
MCV: 86.3 fL (ref 78.0–100.0)
PLATELETS: 305 10*3/uL (ref 150–400)
RBC: 4.6 MIL/uL (ref 3.87–5.11)
RDW: 14.3 % (ref 11.5–15.5)
WBC: 6.2 10*3/uL (ref 4.0–10.5)

## 2015-09-28 LAB — BASIC METABOLIC PANEL
ANION GAP: 9 (ref 5–15)
BUN: 17 mg/dL (ref 6–20)
CALCIUM: 9.1 mg/dL (ref 8.9–10.3)
CO2: 23 mmol/L (ref 22–32)
Chloride: 104 mmol/L (ref 101–111)
Creatinine, Ser: 0.54 mg/dL (ref 0.44–1.00)
GFR calc Af Amer: >60 mL/min (ref >60)
GLUCOSE: 84 mg/dL (ref 65–99)
Potassium: 3.9 mmol/L (ref 3.5–5.1)
Sodium: 136 mmol/L (ref 135–145)

## 2015-09-28 LAB — TROPONIN I

## 2015-09-28 MED ORDER — ENOXAPARIN SODIUM 120 MG/0.8ML ~~LOC~~ SOLN
1.0000 mg/kg | Freq: Once | SUBCUTANEOUS | Status: AC
  Administered 2015-09-28: 115 mg via SUBCUTANEOUS
  Filled 2015-09-28: qty 0.8

## 2015-09-28 MED ORDER — HYDROCODONE-ACETAMINOPHEN 5-325 MG PO TABS
1.0000 | ORAL_TABLET | Freq: Once | ORAL | Status: AC
  Administered 2015-09-28: 1 via ORAL

## 2015-09-28 MED ORDER — NAPROXEN 500 MG PO TABS: 500.0000 mg | ORAL_TABLET | Freq: Two times a day (BID) | ORAL | Status: DC

## 2015-09-28 MED ORDER — HYDROCODONE-ACETAMINOPHEN 5-325 MG PO TABS
ORAL_TABLET | ORAL | Status: AC
  Filled 2015-09-28: qty 1

## 2015-09-28 NOTE — ED Notes (Signed)
Pt reports last night around 1800, pt noticed that her right arm was swelling and having difficulty moving and picking up items.  Pt currently is not able to use right arm, pulses intact but arm has decreased sensation.  Pt also has cp midsternal with no radiation or any other accompanying symptoms.  Pt alert and oriented has no other complaints.

## 2015-09-28 NOTE — ED Notes (Signed)
Unable to find IV access at this time.

## 2015-09-28 NOTE — Discharge Instructions (Signed)
Return tomorrow for ultrasound of right arm. If the ultrasound is negative start taking her Naprosyn twice a day and follow-up with Dr. Fredna Dow in [place]

## 2015-09-28 NOTE — ED Provider Notes (Signed)
CSN: [id number]     Arrival date & time 09/28/15  [id number] History  By signing my name below, I, [name], attest that this documentation has been prepared under the direction and in the presence of Treatment Team:  Attending Provider: Milton Ferguson, MD.  Electronically Signed: Verlee Monte, Medical Scribe. 09/28/2015. 8:28 AM.   Chief Complaint  Patient presents with  . Extremity Weakness  . Chest Pain   Patient is a 30 y.o. female presenting with extremity weakness. The history is provided by the patient. No language interpreter was used.  Extremity Weakness This is a new problem. The current episode started 3 to 5 hours ago. The problem occurs constantly. The problem has not changed since onset.Associated symptoms include chest pain. Pertinent negatives include no abdominal pain and no headaches. Exacerbated by: movement.   HPI Comments: Jessica Sanchez is a 30 y.o. female who presents to the Emergency Department complaining of right arm edema with shooting pain onset 1pm yesterday while at work. Pt mentions she was working at [employer] when it became hard to grip things with her right arm, and her right fingers began to tingle. Pt states she hasn't received any IV's in her right forearm. She states that she is having associated symptoms of right arm weakness onset 6 pm yesterday, tingling in her fingers, chest pain, and numbness onset 6pm yesterday. She denies leg edma and any other symptoms.  Past Medical History  Diagnosis Date  . Fibroid, uterine   . Fibroid (bleeding) (uterine)   . Menorrhagia   . H/O metrorrhagia   . IUD (intrauterine device) in place 07/11/2015   Past Surgical History  Procedure Laterality Date  . Tonsillectomy    . Myomectomy  02/03/2011    Procedure: MYOMECTOMY;  Surgeon: [name], MD;  Location: [facility];  Service: Gynecology;  Laterality: N/A;  . Hysteroscopy w/d&c N/A 09/27/2013    Procedure: DILATATION AND CURETTAGE /HYSTEROSCOPY and insertion of Mirena  IUD ;  Surgeon: [name]. Harrington Challenger, MD;  Location: [facility];  Service: Gynecology;  Laterality: N/A;   Family History  Problem Relation Age of Onset  . Anesthesia problems Neg Hx   . Hypotension Neg Hx   . Malignant hyperthermia Neg Hx   . Pseudochol deficiency Neg Hx   . Cirrhosis Mother   . Diabetes Mother   . Cancer Maternal Grandfather   . Hypertension Sister   . Diabetes Sister    Social History  Substance Use Topics  . Smoking status: Never Smoker   . Smokeless tobacco: Never Used  . Alcohol Use: No   OB History    Gravida Para Term Preterm AB TAB SAB Ectopic Multiple Living   1    1  1         Review of Systems  Constitutional: Negative for appetite change and fatigue.  HENT: Negative for congestion, ear discharge and sinus pressure.   Eyes: Negative for discharge.  Respiratory: Negative for cough.   Cardiovascular: Positive for chest pain. Negative for leg swelling.  Gastrointestinal: Negative for abdominal pain and diarrhea.  Genitourinary: Negative for frequency and hematuria.  Musculoskeletal: Positive for myalgias (right forearm soreness/pain) and extremity weakness. Negative for back pain.  Skin: Negative for rash.  Neurological: Positive for weakness and numbness. Negative for seizures and headaches.  Psychiatric/Behavioral: Negative for hallucinations.   Allergies  Review of patient's allergies indicates no known allergies.  Home Medications   Prior to Admission medications   Medication Sig Start Date End  Date Taking? Authorizing Provider  cephALEXin (KEFLEX) 500 MG capsule Take 1 capsule (500 mg total) by mouth 4 (four) times daily. Patient not taking: Reported on 09/10/2015 08/18/15   Ezequiel Essex, MD  clotrimazole (LOTRIMIN) 1 % cream Apply to affected area 2 times daily Patient not taking: Reported on 09/10/2015 08/18/15   Ezequiel Essex, MD  HYDROcodone-acetaminophen (NORCO/VICODIN) 5-325 MG tablet Take 1 tablet by mouth every 4 (four) hours as needed.  09/11/15   Isla Pence, MD  hydrocortisone cream 1 % Apply to affected area 2 times daily Patient not taking: Reported on 09/10/2015 08/18/15   Ezequiel Essex, MD  ibuprofen (ADVIL,MOTRIN) 800 MG tablet Take 1 tablet (800 mg total) by mouth 3 (three) times daily. 09/11/15   Isla Pence, MD  levonorgestrel (MIRENA) 20 MCG/24HR IUD 1 each by Intrauterine route once.    Historical Provider, MD  traMADol (ULTRAM) 50 MG tablet Take 1 tablet (50 mg total) by mouth every 6 (six) hours as needed. Patient not taking: Reported on 08/18/2015 07/04/15   Milton Ferguson, MD   BP 130/81 mmHg  Pulse 97  Temp(Src) 98.3 F (36.8 C) (Oral)  Resp 18  Ht 5\' 2"  (1.575 m)  Wt 250 lb (113.399 kg)  BMI 45.71 kg/m2  SpO2 97% Physical Exam  Constitutional: She is oriented to person, place, and time. She appears well-developed.  HENT:  Head: Normocephalic.  Eyes: Conjunctivae and EOM are normal. No scleral icterus.  Neck: Neck supple. No thyromegaly present.  Cardiovascular: Normal rate and regular rhythm.  Exam reveals no gallop and no friction rub.   No murmur heard. Pulmonary/Chest: No stridor. She has no wheezes. She has no rales. She exhibits no tenderness.  Abdominal: She exhibits no distension. There is no tenderness. There is no rebound.  Musculoskeletal: Normal range of motion. She exhibits no edema.  Swelling to her right forearm proximal  Lymphadenopathy:    She has no cervical adenopathy.  Neurological: She is oriented to person, place, and time. She exhibits normal muscle tone. Coordination normal.  Decreased grip strength  Skin: No rash noted. No erythema.  Psychiatric: She has a normal mood and affect. Her behavior is normal.    ED Course  Procedures  DIAGNOSTIC STUDIES: Oxygen Saturation is 97% on RA, NL by my interpretation.    COORDINATION OF CARE: 8:16 AM Discussed treatment plan with pt at bedside and pt agreed to plan.  Labs Review Labs Reviewed  BASIC METABOLIC PANEL  CBC   TROPONIN I   Imaging Review Dg Chest 2 View  09/28/2015  CLINICAL DATA:  Chest pain. EXAM: CHEST  2 VIEW COMPARISON:  Sep 10, 2015. FINDINGS: The heart size and mediastinal contours are within normal limits. Both lungs are clear. No pneumothorax or pleural effusion is noted. The visualized skeletal structures are unremarkable. IMPRESSION: No active cardiopulmonary disease. Electronically Signed   By: Marijo Conception, M.D.   On: 09/28/2015 08:52   Dg Forearm Right  09/28/2015  CLINICAL DATA:  Acute right forearm swelling without reported trauma. EXAM: RIGHT FOREARM - 2 VIEW COMPARISON:  None. FINDINGS: There is no evidence of fracture or other focal bone lesions. Soft tissues are unremarkable. IMPRESSION: Normal right forearm. Electronically Signed   By: Marijo Conception, M.D.   On: 09/28/2015 08:55   Ct Head Wo Contrast  09/28/2015  CLINICAL DATA:  Weakness in left hand that began yesterday afternoon. Pt now unable to use left arm/hand. Completion delay due to images not going to pacs/bbj  EXAM: CT HEAD WITHOUT CONTRAST TECHNIQUE: Contiguous axial images were obtained from the base of the skull through the vertex without intravenous contrast. COMPARISON:  08/23/2014 FINDINGS: Brain: No evidence of acute infarction, hemorrhage, extra-axial collection, ventriculomegaly, or mass effect. Vascular: No hyperdense vessel or unexpected calcification. Skull: Negative for fracture or focal lesion. Sinuses/Orbits: No acute findings. Other: None. IMPRESSION: Negative for bleed or other acute intracranial process. Electronically Signed   By: Lucrezia Europe M.D.   On: 09/28/2015 10:00   I have personally reviewed and evaluated these images and lab results as part of my medical decision-making.  Orders placed or performed during the hospital encounter of 09/28/15  . ED EKG within 10 minutes  . ED EKG within 10 minutes  . EKG 12-Lead  . EKG 12-Lead  . EKG 12-Lead  . EKG 12-Lead   EKG  Interpretation  Date/Time:  Sunday Sep 28 2015 07:49:01 EDT Ventricular Rate:  92 PR Interval:  143 QRS Duration: 85 QT Interval:  363 QTC Calculation: 449 R Axis:   25 Text Interpretation:  Sinus rhythm Borderline T abnormalities, anterior leads Confirmed by Shontia Gillooly  MD, Kell Ferris ([id number]) on 09/28/2015 10:32:05 AM   MDM   Final diagnoses:  None   Patient with swelling to right forehand and decreased grip to right hand. Suspect this is a soft tissue injury to the forearm. Patient will get ultrasound of arm more morning. If the ultrasound is negative the patient will start Naprosyn twice a day and follow-up with the hand doctor Dr. Fredna Dow next week   The chart was scribed for me under my direct supervision.  I personally performed the history, physical, and medical decision making and all procedures in the evaluation of this patient.Milton Ferguson, MD 09/28/15 [phone]

## 2015-09-29 ENCOUNTER — Ambulatory Visit (HOSPITAL_COMMUNITY)
Admission: RE | Admit: 2015-09-29 | Discharge: 2015-09-29 | Disposition: A | Discharge status: Home / Self Care | Payer: Self-pay | Source: Ambulatory Visit | Attending: Emergency Medicine | Admitting: Emergency Medicine

## 2015-09-29 DIAGNOSIS — M7989 Other specified soft tissue disorders: Secondary | ICD-10-CM | POA: Insufficient documentation

## 2015-09-29 DIAGNOSIS — M79601 Pain in right arm: Secondary | ICD-10-CM | POA: Insufficient documentation

## 2015-10-05 ENCOUNTER — Emergency Department (HOSPITAL_COMMUNITY): Payer: Self-pay

## 2015-10-05 ENCOUNTER — Emergency Department (HOSPITAL_COMMUNITY)
Admission: EM | Admit: 2015-10-05 | Discharge: 2015-10-06 | Disposition: A | Discharge status: Home / Self Care | Payer: Self-pay | Attending: Emergency Medicine | Admitting: Emergency Medicine

## 2015-10-05 ENCOUNTER — Encounter (HOSPITAL_COMMUNITY): Payer: Self-pay | Admitting: *Deleted

## 2015-10-05 DIAGNOSIS — B379 Candidiasis, unspecified: Secondary | ICD-10-CM | POA: Insufficient documentation

## 2015-10-05 DIAGNOSIS — R110 Nausea: Secondary | ICD-10-CM | POA: Insufficient documentation

## 2015-10-05 DIAGNOSIS — L237 Allergic contact dermatitis due to plants, except food: Secondary | ICD-10-CM | POA: Insufficient documentation

## 2015-10-05 DIAGNOSIS — R05 Cough: Secondary | ICD-10-CM | POA: Insufficient documentation

## 2015-10-05 DIAGNOSIS — L259 Unspecified contact dermatitis, unspecified cause: Secondary | ICD-10-CM

## 2015-10-05 DIAGNOSIS — R079 Chest pain, unspecified: Secondary | ICD-10-CM

## 2015-10-05 DIAGNOSIS — R0789 Other chest pain: Secondary | ICD-10-CM | POA: Insufficient documentation

## 2015-10-05 LAB — COMPREHENSIVE METABOLIC PANEL
ALK PHOS: 60 U/L (ref 38–126)
ALT: 15 U/L (ref 14–54)
ANION GAP: 7 (ref 5–15)
AST: 14 U/L — ABNORMAL LOW (ref 15–41)
Albumin: 4 g/dL (ref 3.5–5.0)
BILIRUBIN TOTAL: 0.4 mg/dL (ref 0.3–1.2)
BUN: 14 mg/dL (ref 6–20)
CALCIUM: 9.1 mg/dL (ref 8.9–10.3)
CO2: 26 mmol/L (ref 22–32)
CREATININE: 0.7 mg/dL (ref 0.44–1.00)
Chloride: 103 mmol/L (ref 101–111)
Glucose, Bld: 97 mg/dL (ref 65–99)
Potassium: 4.1 mmol/L (ref 3.5–5.1)
SODIUM: 136 mmol/L (ref 135–145)
TOTAL PROTEIN: 7.6 g/dL (ref 6.5–8.1)

## 2015-10-05 LAB — URINALYSIS, ROUTINE W REFLEX MICROSCOPIC
BILIRUBIN URINE: NEGATIVE
GLUCOSE, UA: NEGATIVE mg/dL
KETONES UR: NEGATIVE mg/dL
LEUKOCYTES UA: NEGATIVE
Nitrite: NEGATIVE
PH: 6 (ref 5.0–8.0)
PROTEIN: NEGATIVE mg/dL
Specific Gravity, Urine: >1.03 — ABNORMAL HIGH (ref 1.005–1.030)

## 2015-10-05 LAB — CBC WITH DIFFERENTIAL/PLATELET
Basophils Absolute: 0 10*3/uL (ref 0.0–0.1)
Basophils Relative: 0 %
EOS ABS: 0.1 10*3/uL (ref 0.0–0.7)
EOS PCT: 1 %
HCT: 37.9 % (ref 36.0–46.0)
HEMOGLOBIN: 12.2 g/dL (ref 12.0–15.0)
LYMPHS ABS: 2.8 10*3/uL (ref 0.7–4.0)
LYMPHS PCT: 43 %
MCH: 27.6 pg (ref 26.0–34.0)
MCHC: 32.2 g/dL (ref 30.0–36.0)
MCV: 85.7 fL (ref 78.0–100.0)
MONOS PCT: 6 %
Monocytes Absolute: 0.4 10*3/uL (ref 0.1–1.0)
NEUTROS PCT: 50 %
Neutro Abs: 3.2 10*3/uL (ref 1.7–7.7)
Platelets: 302 10*3/uL (ref 150–400)
RBC: 4.42 MIL/uL (ref 3.87–5.11)
RDW: 14 % (ref 11.5–15.5)
WBC: 6.4 10*3/uL (ref 4.0–10.5)

## 2015-10-05 LAB — POC URINE PREG, ED: Preg Test, Ur: NEGATIVE

## 2015-10-05 LAB — URINE MICROSCOPIC-ADD ON

## 2015-10-05 LAB — I-STAT TROPONIN, ED: TROPONIN I, POC: 0 ng/mL (ref 0.00–0.08)

## 2015-10-05 MED ORDER — DIPHENHYDRAMINE HCL 25 MG PO CAPS
25.0000 mg | ORAL_CAPSULE | Freq: Once | ORAL | Status: AC
  Administered 2015-10-06: 25 mg via ORAL
  Filled 2015-10-05: qty 1

## 2015-10-05 MED ORDER — HYDROCORTISONE 1 % EX CREA
TOPICAL_CREAM | Freq: Once | CUTANEOUS | Status: AC
  Administered 2015-10-06: 1 via TOPICAL
  Filled 2015-10-05: qty 1.5

## 2015-10-05 NOTE — ED Notes (Addendum)
Pt c/o chest pain that started earlier in the day, rash that started 2-3 days ago, admits to sob and nausea, denies any vomiting, pt states that breathing makes the chest pain worse, admits to a lot of coughing yesterday,

## 2015-10-05 NOTE — ED Provider Notes (Signed)
CSN: [id number]     Arrival date & time 10/05/15  2230 History  By signing my name below, I, Stephania Fragmin, attest that this documentation has been prepared under the direction and in the presence of [facility], PA-C. Electronically Signed: Stephania Fragmin, ED Scribe. 10/05/2015. 11:28 PM.    Chief Complaint  Patient presents with  . Chest Pain   The history is provided by the patient. No language interpreter was used.    HPI Comments: Jessica Sanchez is a 30 y.o. female with no pertinent past medical history, who presents to the Emergency Department complaining of worsening, sharp, tight, center chest pain that began at 7:30 AM today, about 16 hours ago. Patient states her pain resolved for an hour at about 11 AM before returning. She states she initially ignored her pain, but her pain worsened while she was at work. She reports she experienced nausea and a lot of coughing yesterday. Deep inspiration exacerbates her chest pain. This is a new problem. She denies vomiting or otalgia.  Patient also complains of a gradual-onset, constant, pruritic rash over her bilateral arms and back that began 2 days ago. Patient reports no known allergies or triggers - she denies any changes in detergents, clothes, lotions, or cosmetics. She states she had applied hydrocortisone cream to the affected areas with minimal relief, but she did not try Benadryl.   Patient also complains of gradual-onset, constant, worsening irritation on her right-sided groin and underneath her left breast that began 3-4 days ago. She states she feels like her skin had been sweating and rubbing together. Patient reports she had similar symptoms between the folds of her abdomen in the past. She states she has not tried any treatments for her irritation, although she has tried to keep the affected areas dry.  Past Medical History  Diagnosis Date  . Fibroid, uterine   . Fibroid (bleeding) (uterine)   . Menorrhagia   . H/O metrorrhagia   .  IUD (intrauterine device) in place 07/11/2015   Past Surgical History  Procedure Laterality Date  . Tonsillectomy    . Myomectomy  02/03/2011    Procedure: MYOMECTOMY;  Surgeon: [name], MD;  Location: [facility];  Service: Gynecology;  Laterality: N/A;  . Hysteroscopy w/d&c N/A 09/27/2013    Procedure: DILATATION AND CURETTAGE /HYSTEROSCOPY and insertion of Mirena IUD ;  Surgeon: [name]. Harrington Challenger, MD;  Location: [facility];  Service: Gynecology;  Laterality: N/A;   Family History  Problem Relation Age of Onset  . Anesthesia problems Neg Hx   . Hypotension Neg Hx   . Malignant hyperthermia Neg Hx   . Pseudochol deficiency Neg Hx   . Cirrhosis Mother   . Diabetes Mother   . Cancer Maternal Grandfather   . Hypertension Sister   . Diabetes Sister    Social History  Substance Use Topics  . Smoking status: Never Smoker   . Smokeless tobacco: Never Used  . Alcohol Use: No   OB History    Gravida Para Term Preterm AB TAB SAB Ectopic Multiple Living   1    1  1         Review of Systems  Constitutional: Negative for fever, chills, appetite change, fatigue and unexpected weight change.  HENT: Negative for ear pain and mouth sores.   Eyes: Negative for visual disturbance.  Respiratory: Positive for cough. Negative for chest tightness, shortness of breath and wheezing.   Cardiovascular: Positive for chest pain.  Gastrointestinal: Positive for  nausea. Negative for vomiting, abdominal pain, diarrhea and constipation.  Endocrine: Negative for polydipsia, polyphagia and polyuria.  Genitourinary: Negative for dysuria, urgency, frequency and hematuria.  Musculoskeletal: Negative for back pain and neck stiffness.  Skin: Positive for rash.  Allergic/Immunologic: Negative for immunocompromised state.  Neurological: Negative for syncope, light-headedness and headaches.  Hematological: Does not bruise/bleed easily.  Psychiatric/Behavioral: Negative for sleep disturbance. The patient is not  nervous/anxious.   All other systems reviewed and are negative.  Allergies  Review of patient's allergies indicates no known allergies.  Home Medications   Prior to Admission medications   Medication Sig Start Date End Date Taking? Authorizing Provider  hydrocortisone 2.5 % lotion Apply topically 2 (two) times daily. 10/06/15   Jlynn Langille, PA-C  levonorgestrel (MIRENA) 20 MCG/24HR IUD 1 each by Intrauterine route once.    Historical Provider, MD  naproxen (NAPROSYN) 500 MG tablet Take 1 tablet (500 mg total) by mouth 2 (two) times daily. 09/28/15   Milton Ferguson, MD  nystatin (NYSTATIN) powder Apply topically 4 (four) times daily. 10/06/15   Jeptha Hinnenkamp, PA-C   BP 131/89 mmHg  Pulse 93  Temp(Src) 97.5 F (36.4 C) (Oral)  Resp 20  Ht 5\' 2"  (1.575 m)  Wt 250 lb (113.399 kg)  BMI 45.71 kg/m2  SpO2 100% Physical Exam  Constitutional: She is oriented to person, place, and time. She appears well-developed and well-nourished. No distress.  Awake, alert, nontoxic appearance  HENT:  Head: Normocephalic and atraumatic.  Right Ear: Tympanic membrane, external ear and ear canal normal.  Left Ear: Tympanic membrane, external ear and ear canal normal.  Nose: Nose normal. No mucosal edema or rhinorrhea.  Mouth/Throat: Uvula is midline and oropharynx is clear and moist. No uvula swelling. No oropharyngeal exudate, posterior oropharyngeal edema, posterior oropharyngeal erythema or tonsillar abscesses.  No swelling of the uvula or oropharynx   Eyes: Conjunctivae are normal. No scleral icterus.  Neck: Normal range of motion. Neck supple.  Patent airway No stridor; normal phonation Handling secretions without difficulty  Cardiovascular: Normal rate, regular rhythm, normal heart sounds and intact distal pulses.   No murmur heard. Pulmonary/Chest: Effort normal and breath sounds normal. No stridor. No respiratory distress. She has no wheezes.  Equal chest expansion  Abdominal: Soft.  Bowel sounds are normal. She exhibits no mass. There is no tenderness. There is no rebound and no guarding.  Genitourinary:  Right flexural crease of the groin with beefy red rash and milky discharge; similar rash noted under the left breast on exam  Musculoskeletal: Normal range of motion. She exhibits no edema.  Neurological: She is alert and oriented to person, place, and time.  Speech is clear and goal oriented Moves extremities without ataxia  Skin: Skin is warm and dry. Rash noted. She is not diaphoretic.  Large erythematous raised macules noted to the bilateral forearms Mild excoriations - no induration or fluctuance to indicate secondary infection  Psychiatric: She has a normal mood and affect.  Nursing note and vitals reviewed.   ED Course  Procedures (including critical care time)  DIAGNOSTIC STUDIES: Oxygen Saturation is 100% on RA, normal by my interpretation.    COORDINATION OF CARE: 11:27 PM - Discussed treatment plan with pt at bedside which includes CXR, blood tests, hydrocortisone cream , Benadryl. Pt verbalized understanding and agreed to plan.   Labs Review Labs Reviewed  COMPREHENSIVE METABOLIC PANEL - Abnormal; Notable for the following:    AST 14 (*)    All other components within  normal limits  URINALYSIS, ROUTINE W REFLEX MICROSCOPIC (NOT AT [facility]) - Abnormal; Notable for the following:    APPearance CLOUDY (*)    Specific Gravity, Urine >1.030 (*)    Hgb urine dipstick LARGE (*)    All other components within normal limits  URINE MICROSCOPIC-ADD ON - Abnormal; Notable for the following:    Squamous Epithelial / LPF TOO NUMEROUS TO COUNT (*)    Bacteria, UA MANY (*)    All other components within normal limits  CBC WITH DIFFERENTIAL/PLATELET  Randolm Idol, ED  POC URINE PREG, ED    Imaging Review Dg Chest 2 View  10/06/2015  CLINICAL DATA:  Initial evaluation for acute chest pain, cough. EXAM: CHEST  2 VIEW COMPARISON:  Prior radiograph  09/28/2015. FINDINGS: The cardiac and mediastinal silhouettes are stable in size and contour, and remain within normal limits. The lungs are normally inflated. No airspace consolidation, pleural effusion, or pulmonary edema is identified. There is no pneumothorax. No acute osseous abnormality identified. IMPRESSION: No active cardiopulmonary disease. Electronically Signed   By: Jeannine Boga M.D.   On: 10/06/2015 00:18   I have personally reviewed and evaluated these images and lab results as part of my medical decision-making.   EKG Interpretation   Date/Time:  Sunday October 05 2015 22:36:25 EDT Ventricular Rate:  90 PR Interval:  144 QRS Duration: 76 QT Interval:  360 QTC Calculation: 440 R Axis:   17 Text Interpretation:  Normal sinus rhythm Normal ECG Confirmed by MESNER  MD, Corene Cornea [phone]) on 10/05/2015 10:45:52 PM      MDM   Final diagnoses:  Candida infection  Contact dermatitis  Chest pain, unspecified chest pain type   Jessica Sanchez presents with multiple complaints.  Rash on the arms consistent with contact dermatitis. Patient denies any difficulty breathing or swallowing.  Pt has a patent airway without stridor and is handling secretions without difficulty; no angioedema. No blisters, no pustules, no warmth, no draining sinus tracts, no superficial abscesses, no bullous impetigo, no vesicles, no desquamation, no target lesions with dusky purpura or a central bulla. Not tender to touch. No concern for superimposed infection. No concern for SJS, TEN, TSS, tick borne illness, syphilis or other life-threatening condition. Will give benadryl and hydrocortisone cream in the ED.    Rash to the groin consistent with candida.  No evidence of secondary infection; No induration or fluctuance.  No open wounds.  Discussed proper hygiene and self care during the summer.  Will plan to d/c with nystatin powder.    Urine is a contaminated sample.  No urinary ssx.  Labs reassuring.  Neg Trop  with persistent CP since 11am (>12hrs). CXR without acute abnormalities.  Chest pain is not likely of cardiac or pulmonary etiology d/t presentation, PERC negative, VSS, no tracheal deviation, no JVD or new murmur, RRR, breath sounds equal bilaterally, EKG without acute abnormalities, negative troponin, and negative CXR. Pt has been advised to return to the ED if CP becomes exertional, associated with diaphoresis or nausea, radiates to left jaw/arm, worsens or becomes concerning in any way. Pt appears reliable for follow up and is agreeable to discharge.   I personally performed the services described in this documentation, which was scribed in my presence. The recorded information has been reviewed and is accurate.   Jarrett Soho Olga Seyler, PA-C 10/06/15 [id number]  Ripley Fraise, MD 10/06/15 [phone]

## 2015-10-06 MED ORDER — AEROCHAMBER PLUS FLO-VU MEDIUM MISC
1.0000 | Freq: Once | Status: AC
  Administered 2015-10-06: 1
  Filled 2015-10-06: qty 1

## 2015-10-06 MED ORDER — HYDROCORTISONE 2.5 % EX LOTN: TOPICAL_LOTION | Freq: Two times a day (BID) | CUTANEOUS | Status: DC

## 2015-10-06 MED ORDER — NYSTATIN 100000 UNIT/GM EX POWD: Freq: Four times a day (QID) | CUTANEOUS | Status: DC

## 2015-10-06 MED ORDER — ALBUTEROL SULFATE HFA 108 (90 BASE) MCG/ACT IN AERS
2.0000 | INHALATION_SPRAY | RESPIRATORY_TRACT | Status: DC | PRN
  Administered 2015-10-06: 2 via RESPIRATORY_TRACT
  Filled 2015-10-06: qty 6.7

## 2015-10-06 NOTE — Discharge Instructions (Signed)
1. Medications: albuterol, hydrocortisone lotion on the arms and back; nystatin powder to the groin and under the breast, usual home medications 2. Treatment: rest, drink plenty of fluids,  3. Follow Up: Please followup with your primary doctor in 1 day for discussion of your diagnoses and further evaluation after today's visit; if you do not have a primary care doctor use the resource guide provided to find one; Please return to the ER for worsening symptoms, CP that radiates or is associated with vomiting, fevers, or other concerns   Cutaneous Candidiasis Cutaneous candidiasis is a condition in which there is an overgrowth of yeast (candida) on the skin. Yeast normally live on the skin, but in small enough numbers not to cause any symptoms. In certain cases, increased growth of the yeast may cause an actual yeast infection. This kind of infection usually occurs in areas of the skin that are constantly warm and moist, such as the armpits or the groin. Yeast is the most common cause of diaper rash in babies and in people who cannot control their bowel movements (incontinence). CAUSES  The fungus that most often causes cutaneous candidiasis is Candida albicans. Conditions that can increase the risk of getting a yeast infection of the skin include:  Obesity.  Pregnancy.  Diabetes.  Taking antibiotic medicine.  Taking birth control pills.  Taking steroid medicines.  Thyroid disease.  An iron or zinc deficiency.  Problems with the immune system. SYMPTOMS   Red, swollen area of the skin.  Bumps on the skin.  Itchiness. DIAGNOSIS  The diagnosis of cutaneous candidiasis is usually based on its appearance. Light scrapings of the skin may also be taken and viewed under a microscope to identify the presence of yeast. TREATMENT  Antifungal creams may be applied to the infected skin. In severe cases, oral medicines may be needed.  HOME CARE INSTRUCTIONS   Keep your skin clean and  dry.  Maintain a healthy weight.  If you have diabetes, keep your blood sugar under control. SEEK IMMEDIATE MEDICAL CARE IF:  Your rash continues to spread despite treatment.  You have a fever, chills, or abdominal pain.   This information is not intended to replace advice given to you by your health care provider. Make sure you discuss any questions you have with your health care provider.   Document Released: 01/05/2011 Document Revised: 07/12/2011 Document Reviewed: 10/21/2014 Elsevier Interactive Patient Education 2016 Elsevier Inc[employer] .    Contact Dermatitis Dermatitis is redness, soreness, and swelling (inflammation) of the skin. Contact dermatitis is a reaction to certain substances that touch the skin. There are two types of contact dermatitis:   Irritant contact dermatitis. This type is caused by something that irritates your skin, such as dry hands from washing them too much. This type does not require previous exposure to the substance for a reaction to occur. This type is more common.  Allergic contact dermatitis. This type is caused by a substance that you are allergic to, such as a nickel allergy or poison ivy. This type only occurs if you have been exposed to the substance (allergen) before. Upon a repeat exposure, your body reacts to the substance. This type is less common. CAUSES  Many different substances can cause contact dermatitis. Irritant contact dermatitis is most commonly caused by exposure to:   Makeup.   Soaps.   Detergents.   Bleaches.   Acids.   Metal salts, such as nickel.  Allergic contact dermatitis is most commonly caused by exposure to:  Poisonous plants.   Chemicals.   Jewelry.   Latex.   Medicines.   Preservatives in products, such as clothing.  RISK FACTORS This condition is more likely to develop in:   People who have jobs that expose them to irritants or allergens.  People who have certain medical conditions,  such as asthma or eczema.  SYMPTOMS  Symptoms of this condition may occur anywhere on your body where the irritant has touched you or is touched by you. Symptoms include:  Dryness or flaking.   Redness.   Cracks.   Itching.   Pain or a burning feeling.   Blisters.  Drainage of small amounts of blood or clear fluid from skin cracks. With allergic contact dermatitis, there may also be swelling in areas such as the eyelids, mouth, or genitals.  DIAGNOSIS  This condition is diagnosed with a medical history and physical exam. A patch skin test may be performed to help determine the cause. If the condition is related to your job, you may need to see an occupational medicine specialist. TREATMENT Treatment for this condition includes figuring out what caused the reaction and protecting your skin from further contact. Treatment may also include:   Steroid creams or ointments. Oral steroid medicines may be needed in more severe cases.  Antibiotics or antibacterial ointments, if a skin infection is present.  Antihistamine lotion or an antihistamine taken by mouth to ease itching.  A bandage (dressing). HOME CARE INSTRUCTIONS Skin Care  Moisturize your skin as needed.   Apply cool compresses to the affected areas.  Try taking a bath with:  Epsom salts. Follow the instructions on the packaging. You can get these at your local pharmacy or grocery store.  Baking soda. Pour a small amount into the bath as directed by your health care provider.  Colloidal oatmeal. Follow the instructions on the packaging. You can get this at your local pharmacy or grocery store.  Try applying baking soda paste to your skin. Stir water into baking soda until it reaches a paste-like consistency.  Do not scratch your skin.  Bathe less frequently, such as every other day.  Bathe in lukewarm water. Avoid using hot water. Medicines  Take or apply over-the-counter and prescription medicines  only as told by your health care provider.   If you were prescribed an antibiotic medicine, take or apply your antibiotic as told by your health care provider. Do not stop using the antibiotic even if your condition starts to improve. General Instructions  Keep all follow-up visits as told by your health care provider. This is important.  Avoid the substance that caused your reaction. If you do not know what caused it, keep a journal to try to track what caused it. Write down:  What you eat.  What cosmetic products you use.  What you drink.  What you wear in the affected area. This includes jewelry.  If you were given a dressing, take care of it as told by your health care provider. This includes when to change and remove it. SEEK MEDICAL CARE IF:   Your condition does not improve with treatment.  Your condition gets worse.  You have signs of infection such as swelling, tenderness, redness, soreness, or warmth in the affected area.  You have a fever.  You have new symptoms. SEEK IMMEDIATE MEDICAL CARE IF:   You have a severe headache, neck pain, or neck stiffness.  You vomit.  You feel very sleepy.  You notice red streaks coming  from the affected area.  Your bone or joint underneath the affected area becomes painful after the skin has healed.  The affected area turns darker.  You have difficulty breathing.   This information is not intended to replace advice given to you by your health care provider. Make sure you discuss any questions you have with your health care provider.   Document Released: 04/16/2000 Document Revised: 01/08/2015 Document Reviewed: 09/04/2014 Elsevier Interactive Patient Education [employer].

## 2015-10-17 ENCOUNTER — Emergency Department (HOSPITAL_COMMUNITY): Payer: Worker's Compensation

## 2015-10-17 ENCOUNTER — Emergency Department (HOSPITAL_COMMUNITY)
Admission: EM | Admit: 2015-10-17 | Discharge: 2015-10-18 | Disposition: A | Discharge status: Home / Self Care | Payer: Worker's Compensation | Attending: Emergency Medicine | Admitting: Emergency Medicine

## 2015-10-17 ENCOUNTER — Encounter (HOSPITAL_COMMUNITY): Payer: Self-pay

## 2015-10-17 DIAGNOSIS — W010XXA Fall on same level from slipping, tripping and stumbling without subsequent striking against object, initial encounter: Secondary | ICD-10-CM | POA: Diagnosis not present

## 2015-10-17 DIAGNOSIS — Y939 Activity, unspecified: Secondary | ICD-10-CM | POA: Diagnosis not present

## 2015-10-17 DIAGNOSIS — Y999 Unspecified external cause status: Secondary | ICD-10-CM | POA: Diagnosis not present

## 2015-10-17 DIAGNOSIS — S8001XA Contusion of right knee, initial encounter: Secondary | ICD-10-CM

## 2015-10-17 DIAGNOSIS — Y929 Unspecified place or not applicable: Secondary | ICD-10-CM | POA: Diagnosis not present

## 2015-10-17 DIAGNOSIS — M25561 Pain in right knee: Secondary | ICD-10-CM | POA: Diagnosis present

## 2015-10-17 MED ORDER — NAPROXEN 500 MG PO TABS: 500.0000 mg | ORAL_TABLET | Freq: Two times a day (BID) | ORAL | Status: DC

## 2015-10-17 MED ORDER — IBUPROFEN 400 MG PO TABS
600.0000 mg | ORAL_TABLET | Freq: Once | ORAL | Status: AC
  Administered 2015-10-17: 600 mg via ORAL
  Filled 2015-10-17: qty 2

## 2015-10-17 MED ORDER — HYDROCODONE-ACETAMINOPHEN 5-325 MG PO TABS: 1.0000 | ORAL_TABLET | ORAL | Status: DC | PRN

## 2015-10-17 NOTE — Discharge Instructions (Signed)

## 2015-10-17 NOTE — ED Notes (Signed)
Pt reports pain to R knee after falling and hearing it "pop" today. Previously injured same knee several years ago in similar way.

## 2015-10-17 NOTE — ED Provider Notes (Signed)
CSN: [id number]     Arrival date & time 10/17/15  2143 History   First MD Initiated Contact with Patient 10/17/15 2201     Chief Complaint  Patient presents with  . Knee Injury     (Consider location/radiation/quality/duration/timing/severity/associated sxs/prior Treatment) Patient is a 30 y.o. female presenting with knee pain. The history is provided by the patient. No language interpreter was used.  Knee Pain Location:  Knee Time since incident:  1 hour Injury: yes   Knee location:  R knee Pain details:    Quality:  Throbbing   Radiates to:  Does not radiate   Severity:  Severe   Onset quality:  Sudden   Timing:  Constant   Progression:  Worsening Chronicity:  New Dislocation: no   Foreign body present:  No foreign bodies Prior injury to area:  Yes Relieved by:  None tried Worsened by:  Bearing weight Ineffective treatments:  None tried Associated symptoms: swelling  Decreased active range of motion: due to pain.   Risk factors: obesity    Jessica Sanchez is a 30 y.o. female who presents to the ED with right knee pain. Patient reports she slipped at work and felt a pop in his right knee when he fell on it. She denies any other injuries.   Past Medical History  Diagnosis Date  . Fibroid, uterine   . Fibroid (bleeding) (uterine)   . Menorrhagia   . H/O metrorrhagia   . IUD (intrauterine device) in place 07/11/2015   Past Surgical History  Procedure Laterality Date  . Tonsillectomy    . Myomectomy  02/03/2011    Procedure: MYOMECTOMY;  Surgeon: [name], MD;  Location: [facility];  Service: Gynecology;  Laterality: N/A;  . Hysteroscopy w/d&c N/A 09/27/2013    Procedure: DILATATION AND CURETTAGE /HYSTEROSCOPY and insertion of Mirena IUD ;  Surgeon: [name]. Harrington Challenger, MD;  Location: [facility];  Service: Gynecology;  Laterality: N/A;   Family History  Problem Relation Age of Onset  . Anesthesia problems Neg Hx   . Hypotension Neg Hx   . Malignant hyperthermia Neg Hx   .  Pseudochol deficiency Neg Hx   . Cirrhosis Mother   . Diabetes Mother   . Cancer Maternal Grandfather   . Hypertension Sister   . Diabetes Sister    Social History  Substance Use Topics  . Smoking status: Never Smoker   . Smokeless tobacco: Never Used  . Alcohol Use: No   OB History    Gravida Para Term Preterm AB TAB SAB Ectopic Multiple Living   1    1  1         Review of Systems Negative except as stated in HPI   Allergies  Review of patient's allergies indicates no known allergies.  Home Medications   Prior to Admission medications   Medication Sig Start Date End Date Taking? Authorizing Provider  HYDROcodone-acetaminophen (NORCO/VICODIN) 5-325 MG tablet Take 1 tablet by mouth every 4 (four) hours as needed. 10/17/15   [place], NP  hydrocortisone 2.5 % lotion Apply topically 2 (two) times daily. 10/06/15   Hannah Muthersbaugh, PA-C  levonorgestrel (MIRENA) 20 MCG/24HR IUD 1 each by Intrauterine route once.    Historical Provider, MD  naproxen (NAPROSYN) 500 MG tablet Take 1 tablet (500 mg total) by mouth 2 (two) times daily. 10/17/15   [place], NP  nystatin (NYSTATIN) powder Apply topically 4 (four) times daily. 10/06/15   Jarrett Soho Muthersbaugh, PA-C  BP 101/66 mmHg  Pulse 98  Temp(Src) 98.3 F (36.8 C) (Oral)  Resp 16  Ht 5\' 2"  (1.575 m)  Wt 111.131 kg  BMI 44.80 kg/m2  SpO2 99% Physical Exam  Constitutional: She is oriented to person, place, and time. No distress.  obese  HENT:  Head: Normocephalic and atraumatic.  Eyes: Conjunctivae and EOM are normal.  Neck: Normal range of motion. Neck supple.  Cardiovascular: Normal rate.   Pulmonary/Chest: Effort normal.  Abdominal: Soft. There is no tenderness.  Musculoskeletal:       Right knee: She exhibits swelling. She exhibits no deformity, no erythema, normal alignment and normal patellar mobility. Decreased range of motion: due to pain. Tenderness found.       Legs: Pain with palpation of the patella.  Distal pulses 2+, adequate circulation, good touch sensation.   Neurological: She is alert and oriented to person, place, and time. No cranial nerve deficit.  Skin: Skin is warm and dry.  Psychiatric: She has a normal mood and affect. Her behavior is normal.  Nursing note and vitals reviewed.   ED Course  Procedures (including critical care time) Labs Review Labs Reviewed - No data to display  Imaging Review Dg Knee Complete 4 Views Right  10/17/2015  CLINICAL DATA:  Initial evaluation for acute trauma, fall. Acute right knee pain. EXAM: RIGHT KNEE - COMPLETE 4+ VIEW COMPARISON:  None. FINDINGS: No acute fracture or dislocation. No significant joint effusion. Joint spaces well maintained. Osseous mineralization normal. No soft tissue abnormality. IMPRESSION: No acute abnormality identified about the right knee. Electronically Signed   By: Jeannine Boga M.D.   On: 10/17/2015 23:03    MDM  30 y.o. female with right knee pain s/p fall stable for d/c without focal neuro deficits and no fracture or dislocation noted on x-ray. Stable for d/c with ace wrap to right knee, ice, elevation and pain management.  Discussed with the patient and all questioned fully answered. She will follow up with ortho or return here if any problems arise.   Final diagnoses:  Contusion of right knee, initial encounter       [facility], NP 10/17/15 [place], MD 10/18/15 [phone]

## 2015-10-17 NOTE — ED Notes (Signed)
Patient reports of slipping of drink top and work and felt right knee pop. Reports of then falling onto right knee.

## 2015-10-18 NOTE — ED Notes (Signed)
Pt transported to lab for urine drug screen,

## 2015-11-12 ENCOUNTER — Encounter (HOSPITAL_COMMUNITY): Payer: Self-pay

## 2015-11-12 DIAGNOSIS — H7403 Tympanosclerosis, bilateral: Secondary | ICD-10-CM | POA: Insufficient documentation

## 2015-11-12 DIAGNOSIS — Z79899 Other long term (current) drug therapy: Secondary | ICD-10-CM | POA: Insufficient documentation

## 2015-11-12 DIAGNOSIS — R51 Headache: Secondary | ICD-10-CM | POA: Insufficient documentation

## 2015-11-12 NOTE — ED Notes (Signed)
Bi-lateral ear pain x 3 days 

## 2015-11-13 ENCOUNTER — Emergency Department (HOSPITAL_COMMUNITY)
Admission: EM | Admit: 2015-11-13 | Discharge: 2015-11-13 | Disposition: A | Discharge status: Home / Self Care | Payer: Self-pay | Attending: Emergency Medicine | Admitting: Emergency Medicine

## 2015-11-13 DIAGNOSIS — H9193 Unspecified hearing loss, bilateral: Secondary | ICD-10-CM

## 2015-11-13 DIAGNOSIS — H7403 Tympanosclerosis, bilateral: Secondary | ICD-10-CM

## 2015-11-13 MED ORDER — SALINE SPRAY 0.65 % NA SOLN: 1.0000 | NASAL | Status: DC | PRN

## 2015-11-13 MED ORDER — FLUTICASONE PROPIONATE 50 MCG/ACT NA SUSP: 2.0000 | Freq: Every day | NASAL | Status: DC

## 2015-11-13 NOTE — ED Provider Notes (Signed)
CSN: [id number]     Arrival date & time 11/12/15  2344 History   First MD Initiated Contact with Patient 11/13/15 0135     Chief Complaint  Patient presents with  . Otalgia     (Consider location/radiation/quality/duration/timing/severity/associated sxs/prior Treatment) HPI  This is a 30 year old female who presents with bilateral decreased hearing and pressure over the last 2-3 days. Patient reports that she "can hardly hear out of the right ear and I have pressure in the left ear." She denies any fevers, nasal congestion, rhinorrhea, upper respiratory symptoms. Denies any history of allergies. She denies any trauma to the ear or foreign body. Does report occasional headaches at home. History of tympanostomy tube placement bilaterally.  Past Medical History  Diagnosis Date  . Fibroid, uterine   . Fibroid (bleeding) (uterine)   . Menorrhagia   . H/O metrorrhagia   . IUD (intrauterine device) in place 07/11/2015   Past Surgical History  Procedure Laterality Date  . Tonsillectomy    . Myomectomy  02/03/2011    Procedure: MYOMECTOMY;  Surgeon: [name], MD;  Location: [facility];  Service: Gynecology;  Laterality: N/A;  . Hysteroscopy w/d&c N/A 09/27/2013    Procedure: DILATATION AND CURETTAGE /HYSTEROSCOPY and insertion of Mirena IUD ;  Surgeon: [name]. Harrington Challenger, MD;  Location: [facility];  Service: Gynecology;  Laterality: N/A;   Family History  Problem Relation Age of Onset  . Anesthesia problems Neg Hx   . Hypotension Neg Hx   . Malignant hyperthermia Neg Hx   . Pseudochol deficiency Neg Hx   . Cirrhosis Mother   . Diabetes Mother   . Cancer Maternal Grandfather   . Hypertension Sister   . Diabetes Sister    Social History  Substance Use Topics  . Smoking status: Never Smoker   . Smokeless tobacco: Never Used  . Alcohol Use: No   OB History    Gravida Para Term Preterm AB TAB SAB Ectopic Multiple Living   1    1  1         Review of Systems  HENT: Positive for ear pain  and hearing loss. Negative for congestion, ear discharge and rhinorrhea.        Decreased hearing  Neurological: Positive for headaches.  All other systems reviewed and are negative.     Allergies  Review of patient's allergies indicates no known allergies.  Home Medications   Prior to Admission medications   Medication Sig Start Date End Date Taking? Authorizing Provider  levonorgestrel (MIRENA) 20 MCG/24HR IUD 1 each by Intrauterine route once.   Yes Historical Provider, MD  fluticasone (FLONASE) 50 MCG/ACT nasal spray Place 2 sprays into both nostrils daily. 11/13/15   Merryl Hacker, MD  HYDROcodone-acetaminophen (NORCO/VICODIN) 5-325 MG tablet Take 1 tablet by mouth every 4 (four) hours as needed. 10/17/15   [place], NP  hydrocortisone 2.5 % lotion Apply topically 2 (two) times daily. 10/06/15   Hannah Muthersbaugh, PA-C  naproxen (NAPROSYN) 500 MG tablet Take 1 tablet (500 mg total) by mouth 2 (two) times daily. 10/17/15   [place], NP  nystatin (NYSTATIN) powder Apply topically 4 (four) times daily. 10/06/15   Hannah Muthersbaugh, PA-C  sodium chloride (OCEAN) 0.65 % SOLN nasal spray Place 1 spray into both nostrils as needed for congestion. 11/13/15   Merryl Hacker, MD   BP 111/68 mmHg  Pulse 84  Temp(Src) 98.1 F (36.7 C) (Oral)  Resp 16  Ht 5\' 2"  (  1.575 m)  Wt 230 lb (104.327 kg)  BMI 42.06 kg/m2  SpO2 100% Physical Exam  Constitutional: She is oriented to person, place, and time. She appears well-developed and well-nourished. No distress.  HENT:  Head: Normocephalic and atraumatic.  Tympanosclerosis noted bilaterally right greater than left, effusion noted without purulent, no significant erythema to the tympanic membranes  Eyes: Pupils are equal, round, and reactive to light.  Cardiovascular: Normal rate, regular rhythm and normal heart sounds.   Pulmonary/Chest: Effort normal. No respiratory distress. She has no wheezes.  Neurological: She is alert and  oriented to person, place, and time.  Skin: Skin is warm and dry.  Psychiatric: She has a normal mood and affect.  Nursing note and vitals reviewed.   ED Course  Procedures (including critical care time) Labs Review Labs Reviewed - No data to display  Imaging Review No results found. I have personally reviewed and evaluated these images and lab results as part of my medical decision-making.   EKG Interpretation None      MDM   Final diagnoses:  Decreased hearing of both ears  Tympanosclerosis, bilateral   Patient presents with decreased hearing and pressure in bilateral ears. Denies any other infectious or upper respiratory symptoms. Denies allergies. Nontoxic. Afebrile. Vital signs reassuring. She has evidence of typanosclerosis and bilateral effusions.  No evidence of otitis media. Discussed with patient trying a nasal steroid and nasal saline to clear out the middle ear. Will give follow-up for ENT.  After history, exam, and medical workup I feel the patient has been appropriately medically screened and is safe for discharge home. Pertinent diagnoses were discussed with the patient. Patient was given return precautions.     Merryl Hacker, MD 11/13/15 [phone]

## 2015-11-13 NOTE — Discharge Instructions (Signed)
Seen today for hearing loss in the bilateral ears. Your eardrums appear white and may have some temp and no sclerosis on them.  You may also have some effusion.  You need to follow-up with ENT. In the meantime, nasal saline and nasal steroids to try to open up the eustachian tubes and clear out the middle ear.  Hearing Loss Hearing loss is a partial or total loss of the ability to hear. This can be temporary or permanent, and it can happen in one or both ears. Hearing loss may be referred to as deafness. Medical care is necessary to treat hearing loss properly and to prevent the condition from getting worse. Your hearing may partially or completely come back, depending on what caused your hearing loss and how severe it is. In some cases, hearing loss is permanent. CAUSES Common causes of hearing loss include:   Too much wax in the ear canal.   Infection of the ear canal or middle ear.   Fluid in the middle ear.   Injury to the ear or surrounding area.   An object stuck in the ear.   Prolonged exposure to loud sounds, such as music.  Less common causes of hearing loss include:   Tumors in the ear.   Viral or bacterial infections, such as meningitis.   A hole in the eardrum (perforated eardrum).  Problems with the hearing nerve that sends signals between the brain and the ear.  Certain medicines.  SYMPTOMS  Symptoms of this condition may include:  Difficulty telling the difference between sounds.  Difficulty following a conversation when there is background noise.  Lack of response to sounds in your environment. This may be most noticeable when you do not respond to startling sounds.  Needing to turn up the volume on the television, radio, etc.  Ringing in the ears.  Dizziness.  Pain in the ears. DIAGNOSIS This condition is diagnosed based on a physical exam and a hearing test (audiometry). The audiometry test will be performed by a hearing specialist  (audiologist). You may also be referred to an ear, nose, and throat (ENT) specialist (otolaryngologist).  TREATMENT Treatment for recent onset of hearing loss may include:   Ear wax removal.   Being prescribed medicines to prevent infection (antibiotics).   Being prescribed medicines to reduce inflammation (corticosteroids).  HOME CARE INSTRUCTIONS  If you were prescribed an antibiotic medicine, take it as told by your health care provider. Do not stop taking the antibiotic even if you start to feel better.  Take over-the-counter and prescription medicines only as told by your health care provider.  Avoid loud noises.   Return to your normal activities as told by your health care provider. Ask your health care provider what activities are safe for you.  Keep all follow-up visits as told by your health care provider. This is important. SEEK MEDICAL CARE IF:   You feel dizzy.   You develop new symptoms.   You vomit or feel nauseous.   You have a fever.  SEEK IMMEDIATE MEDICAL CARE IF:  You develop sudden changes in your vision.   You have severe ear pain.   You have new or increased weakness.  You have a severe headache.   This information is not intended to replace advice given to you by your health care provider. Make sure you discuss any questions you have with your health care provider.   Document Released: 04/19/2005 Document Revised: 01/08/2015 Document Reviewed: 09/04/2014 Elsevier Interactive Patient Education  Education ©2016 Elsevier Inc. ° °

## 2015-12-01 ENCOUNTER — Ambulatory Visit (INDEPENDENT_AMBULATORY_CARE_PROVIDER_SITE_OTHER): Payer: Self-pay | Admitting: Otolaryngology

## 2015-12-09 DIAGNOSIS — Y929 Unspecified place or not applicable: Secondary | ICD-10-CM | POA: Insufficient documentation

## 2015-12-09 DIAGNOSIS — Y9301 Activity, walking, marching and hiking: Secondary | ICD-10-CM | POA: Insufficient documentation

## 2015-12-09 DIAGNOSIS — W01198A Fall on same level from slipping, tripping and stumbling with subsequent striking against other object, initial encounter: Secondary | ICD-10-CM | POA: Insufficient documentation

## 2015-12-09 DIAGNOSIS — S161XXA Strain of muscle, fascia and tendon at neck level, initial encounter: Secondary | ICD-10-CM | POA: Insufficient documentation

## 2015-12-09 DIAGNOSIS — Z79899 Other long term (current) drug therapy: Secondary | ICD-10-CM | POA: Insufficient documentation

## 2015-12-09 DIAGNOSIS — S060X1A Concussion with loss of consciousness of 30 minutes or less, initial encounter: Secondary | ICD-10-CM | POA: Insufficient documentation

## 2015-12-09 DIAGNOSIS — Y999 Unspecified external cause status: Secondary | ICD-10-CM | POA: Insufficient documentation

## 2015-12-10 ENCOUNTER — Encounter (HOSPITAL_COMMUNITY): Payer: Self-pay | Admitting: *Deleted

## 2015-12-10 ENCOUNTER — Emergency Department (HOSPITAL_COMMUNITY)
Admission: EM | Admit: 2015-12-10 | Discharge: 2015-12-10 | Disposition: A | Discharge status: Home / Self Care | Payer: Self-pay | Attending: Emergency Medicine | Admitting: Emergency Medicine

## 2015-12-10 ENCOUNTER — Emergency Department (HOSPITAL_COMMUNITY): Payer: Self-pay

## 2015-12-10 DIAGNOSIS — S161XXA Strain of muscle, fascia and tendon at neck level, initial encounter: Secondary | ICD-10-CM

## 2015-12-10 DIAGNOSIS — W19XXXA Unspecified fall, initial encounter: Secondary | ICD-10-CM

## 2015-12-10 DIAGNOSIS — S060X1A Concussion with loss of consciousness of 30 minutes or less, initial encounter: Secondary | ICD-10-CM

## 2015-12-10 MED ORDER — IBUPROFEN 400 MG PO TABS
ORAL_TABLET | ORAL | Status: AC
  Filled 2015-12-10: qty 2

## 2015-12-10 MED ORDER — IBUPROFEN 400 MG PO TABS
600.0000 mg | ORAL_TABLET | Freq: Once | ORAL | Status: AC
  Administered 2015-12-10: 600 mg via ORAL

## 2015-12-10 NOTE — ED Triage Notes (Signed)
Pt states she was on the way to work and slipped on the grass and fell on to the concrete; pt states she had a loc of about 15 minutes and states a bystander saw her fall and stayed with her until she regained consciousness; pt is c/o headache and neck pain

## 2015-12-10 NOTE — Discharge Instructions (Signed)
You have neck pain, possibly from a cervical strain and/or pinched nerve.  ° °SEEK IMMEDIATE MEDICAL ATTENTION IF: °You develop difficulties swallowing or breathing.  °You have new or worse numbness, weakness, tingling, or movement problems in your arms or legs.  °You develop increasing pain which is uncontrolled with medications.  °You have change in bowel or bladder function, or other concerns. ° ° ° °

## 2015-12-10 NOTE — ED Provider Notes (Signed)
[facility] DEPT Provider Note   CSN: [id number] Arrival date & time: 12/09/15  2316  First Provider Contact:  None       History   Chief Complaint Chief Complaint  Patient presents with  . Loss of Consciousness    HPI Jessica Sanchez is a 30 y.o. female.  The history is provided by the patient.  Loss of Consciousness   This is a new problem. The current episode started 3 to 5 hours ago. The problem has been rapidly improving. She lost consciousness for a period of greater than 5 minutes. Associated symptoms include dizziness and headaches. Pertinent negatives include chest pain, confusion, fever, focal weakness, seizures, vomiting and weakness. She has tried nothing for the symptoms.   Patient reports she was walking outside at work at approximately 20:30pm on 12/09/15 She reports she slipped and fell onto concrete She hit her head on concrete She reportedly had LOC for 15 minutes per bystander No seizure No vomiting No cp/dizziness prior to fall She reports neck pain No focal weakness No other complaints Past Medical History:  Diagnosis Date  . Fibroid (bleeding) (uterine)   . Fibroid, uterine   . H/O metrorrhagia   . IUD (intrauterine device) in place 07/11/2015  . Menorrhagia     Patient Active Problem List   Diagnosis Date Noted  . IUD (intrauterine device) in place 07/11/2015  . Other disorder of menstruation and other abnormal bleeding from female genital tract 10/30/2012    Past Surgical History:  Procedure Laterality Date  . HYSTEROSCOPY W/D&C N/A 09/27/2013   Procedure: DILATATION AND CURETTAGE /HYSTEROSCOPY and insertion of Mirena IUD ;  Surgeon: [name]. Harrington Challenger, MD;  Location: [facility];  Service: Gynecology;  Laterality: N/A;  . MYOMECTOMY  02/03/2011   Procedure: MYOMECTOMY;  Surgeon: [name], MD;  Location: [facility];  Service: Gynecology;  Laterality: N/A;  . TONSILLECTOMY      OB History    Gravida Para Term Preterm AB Living   1       1     SAB  TAB Ectopic Multiple Live Births   1               Home Medications    Prior to Admission medications   Medication Sig Start Date End Date Taking? Authorizing Provider  fluticasone (FLONASE) 50 MCG/ACT nasal spray Place 2 sprays into both nostrils daily. 11/13/15   Merryl Hacker, MD  HYDROcodone-acetaminophen (NORCO/VICODIN) 5-325 MG tablet Take 1 tablet by mouth every 4 (four) hours as needed. 10/17/15   [place], NP  hydrocortisone 2.5 % lotion Apply topically 2 (two) times daily. 10/06/15   Hannah Muthersbaugh, PA-C  levonorgestrel (MIRENA) 20 MCG/24HR IUD 1 each by Intrauterine route once.    Historical Provider, MD  naproxen (NAPROSYN) 500 MG tablet Take 1 tablet (500 mg total) by mouth 2 (two) times daily. 10/17/15   [place], NP  nystatin (NYSTATIN) powder Apply topically 4 (four) times daily. 10/06/15   Hannah Muthersbaugh, PA-C  sodium chloride (OCEAN) 0.65 % SOLN nasal spray Place 1 spray into both nostrils as needed for congestion. 11/13/15   Merryl Hacker, MD    Family History Family History  Problem Relation Age of Onset  . Cirrhosis Mother   . Diabetes Mother   . Cancer Maternal Grandfather   . Hypertension Sister   . Diabetes Sister   . Anesthesia problems Neg Hx   . Hypotension Neg Hx   .  Malignant hyperthermia Neg Hx   . Pseudochol deficiency Neg Hx     Social History Social History  Substance Use Topics  . Smoking status: Never Smoker  . Smokeless tobacco: Never Used  . Alcohol use No     Allergies   Review of patient's allergies indicates no known allergies.   Review of Systems Review of Systems  Constitutional: Negative for fever.  Cardiovascular: Positive for syncope. Negative for chest pain.  Gastrointestinal: Negative for vomiting.  Musculoskeletal: Positive for neck pain.  Neurological: Positive for dizziness and headaches. Negative for focal weakness, seizures and weakness.  Psychiatric/Behavioral: Negative for confusion.  All  other systems reviewed and are negative.    Physical Exam Updated Vital Signs BP 120/76   Pulse 76   Temp 98.4 F (36.9 C) (Oral)   Resp 18   Ht 5\' 2"  (1.575 m)   Wt 120.7 kg   SpO2 100%   BMI 48.65 kg/m   Physical Exam  CONSTITUTIONAL: Well developed/well nourished HEAD: Normocephalic/atraumatic, tenderness to scalp but no bleeding or stepoffs noted EYES: EOMI/PERRL ENMT: Mucous membranes moist NECK: supple no meningeal signs SPINE/BACK:cervical spine tenderness, no other tenderness, No bruising/crepitance/stepoffs noted to spine CV: S1/S2 noted, no murmurs/rubs/gallops noted LUNGS: Lungs are clear to auscultation bilaterally, no apparent distress ABDOMEN: soft, nontender, no rebound or guarding, bowel sounds noted throughout abdomen GU:no cva tenderness NEURO: Pt is awake/alert/appropriate, moves all extremitiesx4.  No facial droop.  GCS 15.  Pt ambulatory EXTREMITIES: pulses normal/equal, full ROM SKIN: warm, color normal PSYCH: no abnormalities of mood noted, alert and oriented to situation  ED Treatments / Results  Labs (all labs ordered are listed, but only abnormal results are displayed) Labs Reviewed - No data to display  EKG  EKG Interpretation None       Radiology No results found.  Procedures Procedures (including critical care time)  Medications Ordered in ED Medications  ibuprofen (ADVIL,MOTRIN) tablet 600 mg (600 mg Oral Given 12/10/15 0145)     Initial Impression / Assessment and Plan / ED Course  I have reviewed the triage vital signs and the nursing notes.  Pertinent labs & imaging results that were available during my care of the patient were reviewed by me and considered in my medical decision making (see chart for details).  Clinical Course    cspine xray ordered as pt with midline tenderness and unable to rotate head It has been >4 hrs since head injury,no vomiting, GCS 15, she had extended LOC but now at baseline without any other  high risk features Defer CT head for now  2:13 AM Xray negative Pt well appearing Will d/c home Discussed strict return precautions  Final Clinical Impressions(s) / ED Diagnoses   Final diagnoses:  Concussion, with loss of consciousness of 30 minutes or less, initial encounter  Cervical strain, acute, initial encounter    New Prescriptions New Prescriptions   No medications on file     Ripley Fraise, MD 12/10/15 [phone]

## 2015-12-10 NOTE — ED Notes (Signed)
Pt requesting something to eat; pt informed she could not have anything at this time

## 2015-12-11 ENCOUNTER — Emergency Department (HOSPITAL_COMMUNITY): Payer: Self-pay

## 2015-12-11 ENCOUNTER — Encounter (HOSPITAL_COMMUNITY): Payer: Self-pay | Admitting: Emergency Medicine

## 2015-12-11 ENCOUNTER — Emergency Department (HOSPITAL_COMMUNITY)
Admission: EM | Admit: 2015-12-11 | Discharge: 2015-12-11 | Disposition: A | Discharge status: Home / Self Care | Payer: Self-pay | Attending: Emergency Medicine | Admitting: Emergency Medicine

## 2015-12-11 DIAGNOSIS — S39012A Strain of muscle, fascia and tendon of lower back, initial encounter: Secondary | ICD-10-CM | POA: Insufficient documentation

## 2015-12-11 DIAGNOSIS — F0781 Postconcussional syndrome: Secondary | ICD-10-CM | POA: Insufficient documentation

## 2015-12-11 DIAGNOSIS — Y929 Unspecified place or not applicable: Secondary | ICD-10-CM | POA: Insufficient documentation

## 2015-12-11 DIAGNOSIS — Y939 Activity, unspecified: Secondary | ICD-10-CM | POA: Insufficient documentation

## 2015-12-11 DIAGNOSIS — T148XXA Other injury of unspecified body region, initial encounter: Secondary | ICD-10-CM

## 2015-12-11 DIAGNOSIS — Z79899 Other long term (current) drug therapy: Secondary | ICD-10-CM | POA: Insufficient documentation

## 2015-12-11 DIAGNOSIS — R51 Headache: Secondary | ICD-10-CM | POA: Insufficient documentation

## 2015-12-11 DIAGNOSIS — Y999 Unspecified external cause status: Secondary | ICD-10-CM | POA: Insufficient documentation

## 2015-12-11 DIAGNOSIS — W01198A Fall on same level from slipping, tripping and stumbling with subsequent striking against other object, initial encounter: Secondary | ICD-10-CM | POA: Insufficient documentation

## 2015-12-11 MED ORDER — ONDANSETRON 4 MG PO TBDP
4.0000 mg | ORAL_TABLET | Freq: Once | ORAL | Status: AC
  Administered 2015-12-11: 4 mg via ORAL
  Filled 2015-12-11: qty 1

## 2015-12-11 MED ORDER — METHOCARBAMOL 500 MG PO TABS
1000.0000 mg | ORAL_TABLET | Freq: Once | ORAL | Status: AC
  Administered 2015-12-11: 1000 mg via ORAL
  Filled 2015-12-11: qty 2

## 2015-12-11 MED ORDER — ONDANSETRON HCL 4 MG PO TABS: 4.0000 mg | ORAL_TABLET | Freq: Three times a day (TID) | ORAL | 0 refills | Status: DC | PRN

## 2015-12-11 MED ORDER — METHOCARBAMOL 500 MG PO TABS: 1000.0000 mg | ORAL_TABLET | Freq: Three times a day (TID) | ORAL | 0 refills | Status: DC | PRN

## 2015-12-11 NOTE — ED Provider Notes (Signed)
[facility] DEPT Provider Note   CSN: [id number] Arrival date & time: 12/11/15  1331  First Provider Contact:  First MD Initiated Contact with Patient 12/11/15 1406        History   Chief Complaint Chief Complaint  Patient presents with  . Emesis    HPI Jessica Sanchez is a 30 y.o. female.  HPI Patient presents with persistent headache, neck pain and back pain after fall 2 days ago. At that time she had slip and fall and struck her head on concrete. Loss of consciousness for roughly 15 minutes per bystander. He was evaluated in the emergency department and time. Had cervical x-rays that showed no abnormality. CT was deferred due to normal neurologic exam and improving symptoms. Patient states she is now has continual left-sided head pressure and pain. She has episodic blurred vision and has had several episodes of nausea and vomiting. She 2 episodes of vomiting yesterday and 4 today. States she's been taking 1-2 tabs of her 800 mg ibuprofen for her symptoms. Denies any abdominal pain, melena or blood in stool. No focal weakness or numbness. Past Medical History:  Diagnosis Date  . Fibroid (bleeding) (uterine)   . Fibroid, uterine   . H/O metrorrhagia   . IUD (intrauterine device) in place 07/11/2015  . Menorrhagia     Patient Active Problem List   Diagnosis Date Noted  . IUD (intrauterine device) in place 07/11/2015  . Other disorder of menstruation and other abnormal bleeding from female genital tract 10/30/2012    Past Surgical History:  Procedure Laterality Date  . HYSTEROSCOPY W/D&C N/A 09/27/2013   Procedure: DILATATION AND CURETTAGE /HYSTEROSCOPY and insertion of Mirena IUD ;  Surgeon: [name]. Harrington Challenger, MD;  Location: [facility];  Service: Gynecology;  Laterality: N/A;  . MYOMECTOMY  02/03/2011   Procedure: MYOMECTOMY;  Surgeon: [name], MD;  Location: [facility];  Service: Gynecology;  Laterality: N/A;  . TONSILLECTOMY      OB History    Gravida Para Term Preterm AB  Living   1       1     SAB TAB Ectopic Multiple Live Births   1               Home Medications    Prior to Admission medications   Medication Sig Start Date End Date Taking? Authorizing Provider  fluticasone (FLONASE) 50 MCG/ACT nasal spray Place 2 sprays into both nostrils daily. 11/13/15  Yes Merryl Hacker, MD  HYDROcodone-acetaminophen (NORCO/VICODIN) 5-325 MG tablet Take 1 tablet by mouth every 4 (four) hours as needed. 10/17/15  Yes Hope Bunnie Pion, NP  levonorgestrel (MIRENA) 20 MCG/24HR IUD 1 each by Intrauterine route once.   Yes Historical Provider, MD  hydrocortisone 2.5 % lotion Apply topically 2 (two) times daily. Patient not taking: Reported on 12/11/2015 10/06/15   Jarrett Soho Muthersbaugh, PA-C  methocarbamol (ROBAXIN) 500 MG tablet Take 2 tablets (1,000 mg total) by mouth every 8 (eight) hours as needed for muscle spasms. 12/11/15   Julianne Rice, MD  naproxen (NAPROSYN) 500 MG tablet Take 1 tablet (500 mg total) by mouth 2 (two) times daily. Patient not taking: Reported on 12/11/2015 10/17/15   Ashley Murrain, NP  nystatin (NYSTATIN) powder Apply topically 4 (four) times daily. Patient not taking: Reported on 12/11/2015 10/06/15   Jarrett Soho Muthersbaugh, PA-C  ondansetron (ZOFRAN) 4 MG tablet Take 1 tablet (4 mg total) by mouth every 8 (eight) hours as needed for nausea or vomiting. 12/11/15  Julianne Rice, MD  sodium chloride (OCEAN) 0.65 % SOLN nasal spray Place 1 spray into both nostrils as needed for congestion. Patient not taking: Reported on 12/11/2015 11/13/15   Merryl Hacker, MD    Family History Family History  Problem Relation Age of Onset  . Cirrhosis Mother   . Diabetes Mother   . Cancer Maternal Grandfather   . Hypertension Sister   . Diabetes Sister   . Anesthesia problems Neg Hx   . Hypotension Neg Hx   . Malignant hyperthermia Neg Hx   . Pseudochol deficiency Neg Hx     Social History Social History  Substance Use Topics  . Smoking status: Never Smoker    . Smokeless tobacco: Never Used  . Alcohol use No     Allergies   Review of patient's allergies indicates no known allergies.   Review of Systems Review of Systems  Constitutional: Negative for chills and fever.  Eyes: Positive for photophobia and visual disturbance.  Respiratory: Negative for shortness of breath.   Cardiovascular: Negative for chest pain.  Gastrointestinal: Positive for nausea and vomiting. Negative for abdominal pain, blood in stool and diarrhea.  Musculoskeletal: Positive for back pain, myalgias and neck pain. Negative for neck stiffness.  Skin: Negative for rash.  Neurological: Positive for headaches. Negative for dizziness, weakness, light-headedness and numbness.  All other systems reviewed and are negative.    Physical Exam Updated Vital Signs BP 110/64 (BP Location: Left Arm)   Pulse 84   Temp 98.9 F (37.2 C) (Oral)   Resp 16   Ht 5\' 2"  (1.575 m)   Wt 266 lb (120.7 kg)   SpO2 99%   BMI 48.65 kg/m   Physical Exam  Constitutional: She is oriented to person, place, and time. She appears well-developed and well-nourished.  HENT:  Head: Normocephalic and atraumatic.  Mouth/Throat: Oropharynx is clear and moist.  Left-sided scalp tenderness without obvious swelling. No hemotympanum. No periorbital ecchymosis or posterior auricular ecchymosis.   Eyes: EOM are normal. Pupils are equal, round, and reactive to light.  Neck: Normal range of motion. Neck supple.  Diffuse paracervical muscular tenderness. No definite midline tenderness or step-offs.  Cardiovascular: Normal rate and regular rhythm.   Pulmonary/Chest: Effort normal and breath sounds normal.  Abdominal: Soft. Bowel sounds are normal. There is no tenderness. There is no rebound and no guarding.  Musculoskeletal: Normal range of motion. She exhibits no edema.  Diffuse thoracic and lumbar muscular tenderness without focal midline thoracic or lumbar tenderness. Distal pulses are equal and 2+.   Neurological: She is alert and oriented to person, place, and time.  Patient is alert and oriented x3 with clear, goal oriented speech. Patient has 5/5 motor in all extremities. Sensation is intact to light touch. Bilateral finger-to-nose is normal with no signs of dysmetria.   Skin: Skin is warm and dry. No rash noted. No erythema.  Psychiatric: She has a normal mood and affect. Her behavior is normal.  Nursing note and vitals reviewed.    ED Treatments / Results  Labs (all labs ordered are listed, but only abnormal results are displayed) Labs Reviewed - No data to display  EKG  EKG Interpretation None       Radiology Ct Head Wo Contrast  Result Date: 12/11/2015 CLINICAL DATA:  Head trauma secondary to a fall. Neck stiffness. Vomiting. EXAM: CT HEAD WITHOUT CONTRAST TECHNIQUE: Contiguous axial images were obtained from the base of the skull through the vertex without intravenous contrast. COMPARISON:  09/28/2015 FINDINGS: No mass lesion. No midline shift. No acute hemorrhage or hematoma. No extra-axial fluid collections. No evidence of acute infarction. Brain parenchyma is normal. Bones are normal. IMPRESSION: Normal exam. Electronically Signed   By: Lorriane Shire M.D.   On: 12/11/2015 15:36    Procedures Procedures (including critical care time)  Medications Ordered in ED Medications  ondansetron (ZOFRAN-ODT) disintegrating tablet 4 mg (4 mg Oral Given 12/11/15 1602)  methocarbamol (ROBAXIN) tablet 1,000 mg (1,000 mg Oral Given 12/11/15 1602)     Initial Impression / Assessment and Plan / ED Course  I have reviewed the triage vital signs and the nursing notes.  Pertinent labs & imaging results that were available during my care of the patient were reviewed by me and considered in my medical decision making (see chart for details).  Clinical Course   CT without any acute abnormalities. Patient needs to have normal neurologic exam. We'll treat symptomatically and given  head injury precautions. She is advised to follow-up with her primary physician and may need neurology referral. She is to avoid exercise or any exertion. Change since need to return immediately for any worsening headache, visual changes, focal weakness, sensory changes, persistent vomiting or for any concerns.   Final Clinical Impressions(s) / ED Diagnoses   Final diagnoses:  Postconcussive syndrome  Muscle strain    New Prescriptions Discharge Medication List as of 12/11/2015  3:55 PM    START taking these medications   Details  methocarbamol (ROBAXIN) 500 MG tablet Take 2 tablets (1,000 mg total) by mouth every 8 (eight) hours as needed for muscle spasms., Starting Thu 12/11/2015, Print    ondansetron (ZOFRAN) 4 MG tablet Take 1 tablet (4 mg total) by mouth every 8 (eight) hours as needed for nausea or vomiting., Starting Thu 12/11/2015, Print         Julianne Rice, MD 12/12/15 1541

## 2015-12-11 NOTE — ED Triage Notes (Signed)
Seen here on 12/09/15 for fall.  Pt back today c/o vomiting 5-6 times today.  C/o stiffness to neck and pressure in head.

## 2015-12-29 ENCOUNTER — Ambulatory Visit (INDEPENDENT_AMBULATORY_CARE_PROVIDER_SITE_OTHER): Payer: Self-pay | Admitting: Otolaryngology

## 2016-01-01 ENCOUNTER — Emergency Department (HOSPITAL_COMMUNITY): Payer: Self-pay

## 2016-01-01 ENCOUNTER — Emergency Department (HOSPITAL_COMMUNITY)
Admission: EM | Admit: 2016-01-01 | Discharge: 2016-01-01 | Disposition: A | Discharge status: Home / Self Care | Payer: Self-pay | Attending: Emergency Medicine | Admitting: Emergency Medicine

## 2016-01-01 ENCOUNTER — Encounter (HOSPITAL_COMMUNITY): Payer: Self-pay | Admitting: Cardiology

## 2016-01-01 DIAGNOSIS — R197 Diarrhea, unspecified: Secondary | ICD-10-CM | POA: Insufficient documentation

## 2016-01-01 DIAGNOSIS — R112 Nausea with vomiting, unspecified: Secondary | ICD-10-CM | POA: Insufficient documentation

## 2016-01-01 LAB — COMPREHENSIVE METABOLIC PANEL
ALK PHOS: 57 U/L (ref 38–126)
ALT: 15 U/L (ref 14–54)
ANION GAP: 8 (ref 5–15)
AST: 14 U/L — ABNORMAL LOW (ref 15–41)
Albumin: 4.1 g/dL (ref 3.5–5.0)
BILIRUBIN TOTAL: 0.6 mg/dL (ref 0.3–1.2)
BUN: 12 mg/dL (ref 6–20)
CALCIUM: 9.2 mg/dL (ref 8.9–10.3)
CO2: 22 mmol/L (ref 22–32)
Chloride: 105 mmol/L (ref 101–111)
Creatinine, Ser: 0.62 mg/dL (ref 0.44–1.00)
GFR calc non Af Amer: >60 mL/min (ref >60)
Glucose, Bld: 86 mg/dL (ref 65–99)
Potassium: 4 mmol/L (ref 3.5–5.1)
Sodium: 135 mmol/L (ref 135–145)
TOTAL PROTEIN: 7.6 g/dL (ref 6.5–8.1)

## 2016-01-01 LAB — URINALYSIS, ROUTINE W REFLEX MICROSCOPIC
BILIRUBIN URINE: NEGATIVE
Glucose, UA: NEGATIVE mg/dL
Ketones, ur: NEGATIVE mg/dL
Leukocytes, UA: NEGATIVE
NITRITE: NEGATIVE
PROTEIN: NEGATIVE mg/dL
SPECIFIC GRAVITY, URINE: 1.015 (ref 1.005–1.030)
pH: 7 (ref 5.0–8.0)

## 2016-01-01 LAB — PREGNANCY, URINE: PREG TEST UR: NEGATIVE

## 2016-01-01 LAB — CBC
HCT: 38.6 % (ref 36.0–46.0)
HEMOGLOBIN: 12.8 g/dL (ref 12.0–15.0)
MCH: 28 pg (ref 26.0–34.0)
MCHC: 33.2 g/dL (ref 30.0–36.0)
MCV: 84.5 fL (ref 78.0–100.0)
Platelets: 289 10*3/uL (ref 150–400)
RBC: 4.57 MIL/uL (ref 3.87–5.11)
RDW: 14.2 % (ref 11.5–15.5)
WBC: 5.8 10*3/uL (ref 4.0–10.5)

## 2016-01-01 LAB — LIPASE, BLOOD: Lipase: 21 U/L (ref 11–51)

## 2016-01-01 LAB — URINE MICROSCOPIC-ADD ON

## 2016-01-01 MED ORDER — ONDANSETRON HCL 4 MG PO TABS: 4.0000 mg | ORAL_TABLET | Freq: Three times a day (TID) | ORAL | 0 refills | Status: DC | PRN

## 2016-01-01 MED ORDER — ACETAMINOPHEN 325 MG PO TABS
650.0000 mg | ORAL_TABLET | Freq: Once | ORAL | Status: AC
  Administered 2016-01-01: 650 mg via ORAL
  Filled 2016-01-01: qty 2

## 2016-01-01 MED ORDER — IBUPROFEN 400 MG PO TABS
400.0000 mg | ORAL_TABLET | Freq: Once | ORAL | Status: AC
  Administered 2016-01-01: 400 mg via ORAL
  Filled 2016-01-01: qty 1

## 2016-01-01 MED ORDER — SODIUM CHLORIDE 0.9 % IV BOLUS (SEPSIS)
1000.0000 mL | Freq: Once | INTRAVENOUS | Status: AC
  Administered 2016-01-01: 1000 mL via INTRAVENOUS

## 2016-01-01 MED ORDER — ONDANSETRON HCL 4 MG/2ML IJ SOLN
4.0000 mg | INTRAMUSCULAR | Status: DC | PRN
  Administered 2016-01-01: 4 mg via INTRAVENOUS
  Filled 2016-01-01: qty 2

## 2016-01-01 NOTE — ED Triage Notes (Signed)
Vomiting,  Diarrhea,  Fever 101.5  Started this morning .

## 2016-01-01 NOTE — Discharge Instructions (Signed)
Take the prescription as directed.  Increase your fluid intake (ie:  Gatoraide) for the next few days, as discussed.  Eat a bland diet and advance to your regular diet slowly as you can tolerate it.   Avoid full strength juices, as well as milk and milk products until your diarrhea has resolved.   Call your regular medical doctor today to schedule a follow up appointment this week.  Return to the Emergency Department immediately sooner if not improving (or even worsening) despite taking the medicines as prescribed, any black or bloody stool or vomit, or for any other concerns.

## 2016-01-01 NOTE — ED Provider Notes (Signed)
[facility] DEPT Provider Note   CSN: [id number] Arrival date & time: 01/01/16  1209     History   Chief Complaint No chief complaint on file.   HPI Jessica Sanchez is a 30 y.o. female.  HPI  Pt was seen at 1230. Per pt, c/o gradual onset and persistence of multiple intermittent episodes of N/V/D that began overnight last night. Describes the stools as "watery." Has been associated with home fevers to "101" as well as generalized body aches and mild suprapubic abd "pain." Denies vaginal bleeding, no CP/SOB, no back pain, no fevers, no black or blood in stools or emesis.    Past Medical History:  Diagnosis Date  . Fibroid (bleeding) (uterine)   . Fibroid, uterine   . H/O metrorrhagia   . IUD (intrauterine device) in place 07/11/2015  . Menorrhagia     Patient Active Problem List   Diagnosis Date Noted  . IUD (intrauterine device) in place 07/11/2015  . Other disorder of menstruation and other abnormal bleeding from female genital tract 10/30/2012    Past Surgical History:  Procedure Laterality Date  . HYSTEROSCOPY W/D&C N/A 09/27/2013   Procedure: DILATATION AND CURETTAGE /HYSTEROSCOPY and insertion of Mirena IUD ;  Surgeon: [name]. Harrington Challenger, MD;  Location: [facility];  Service: Gynecology;  Laterality: N/A;  . MYOMECTOMY  02/03/2011   Procedure: MYOMECTOMY;  Surgeon: [name], MD;  Location: [facility];  Service: Gynecology;  Laterality: N/A;  . TONSILLECTOMY      OB History    Gravida Para Term Preterm AB Living   1       1     SAB TAB Ectopic Multiple Live Births   1               Home Medications    Prior to Admission medications   Medication Sig Start Date End Date Taking? Authorizing Provider  fluticasone (FLONASE) 50 MCG/ACT nasal spray Place 2 sprays into both nostrils daily. 11/13/15   Merryl Hacker, MD  HYDROcodone-acetaminophen (NORCO/VICODIN) 5-325 MG tablet Take 1 tablet by mouth every 4 (four) hours as needed. 10/17/15   Hope Bunnie Pion, NP    levonorgestrel (MIRENA) 20 MCG/24HR IUD 1 each by Intrauterine route once.    Historical Provider, MD  methocarbamol (ROBAXIN) 500 MG tablet Take 2 tablets (1,000 mg total) by mouth every 8 (eight) hours as needed for muscle spasms. 12/11/15   Julianne Rice, MD  ondansetron (ZOFRAN) 4 MG tablet Take 1 tablet (4 mg total) by mouth every 8 (eight) hours as needed for nausea or vomiting. 12/11/15   Julianne Rice, MD    Family History Family History  Problem Relation Age of Onset  . Cirrhosis Mother   . Diabetes Mother   . Cancer Maternal Grandfather   . Hypertension Sister   . Diabetes Sister   . Anesthesia problems Neg Hx   . Hypotension Neg Hx   . Malignant hyperthermia Neg Hx   . Pseudochol deficiency Neg Hx     Social History Social History  Substance Use Topics  . Smoking status: Never Smoker  . Smokeless tobacco: Never Used  . Alcohol use No     Allergies   Review of patient's allergies indicates no known allergies.   Review of Systems Review of Systems ROS: Statement: All systems negative except as marked or noted in the HPI; Constitutional: +fever and chills, body aches. ; ; Eyes: Negative for eye pain, redness and discharge. ; ; ENMT: Negative  for ear pain, hoarseness, nasal congestion, sinus pressure and sore throat. ; ; Cardiovascular: Negative for chest pain, palpitations, diaphoresis, dyspnea and peripheral edema. ; ; Respiratory: Negative for cough, wheezing and stridor. ; ; Gastrointestinal: +N/V/D, abd pain. Negative for blood in stool, hematemesis, jaundice and rectal bleeding. . ; ; Genitourinary: Negative for dysuria, flank pain and hematuria. ; ; Musculoskeletal: Negative for back pain and neck pain. Negative for swelling and trauma.; ; Skin: Negative for pruritus, rash, abrasions, blisters, bruising and skin lesion.; ; Neuro: Negative for headache, lightheadedness and neck stiffness. Negative for weakness, altered level of consciousness, altered mental status,  extremity weakness, paresthesias, involuntary movement, seizure and syncope.      Physical Exam Updated Vital Signs BP 131/70 (BP Location: Left Arm)   Pulse 87   Temp 98.2 F (36.8 C) (Oral)   Resp 16   Ht 5\' 3"  (1.6 m)   Wt 246 lb (111.6 kg)   SpO2 100%   BMI 43.58 kg/m      Physical Exam 1235: Physical examination:  Nursing notes reviewed; Vital signs and O2 SAT reviewed;  Constitutional: Well developed, Well nourished, Well hydrated, In no acute distress; Head:  Normocephalic, atraumatic; Eyes: EOMI, PERRL, No scleral icterus; ENMT: Mouth and pharynx normal, Mucous membranes moist; Neck: Supple, Full range of motion, No lymphadenopathy; Cardiovascular: Regular rate and rhythm, No murmur, rub, or gallop; Respiratory: Breath sounds clear & equal bilaterally, No rales, rhonchi, wheezes.  Speaking full sentences with ease, Normal respiratory effort/excursion; Chest: Nontender, Movement normal; Abdomen: Soft, Nontender, Nondistended, Normal bowel sounds; Genitourinary: No CVA tenderness; Extremities: Pulses normal, No tenderness, No edema, No calf edema or asymmetry.; Neuro: AA&Ox3, Major CN grossly intact.  Speech clear. No gross focal motor or sensory deficits in extremities.; Skin: Color normal, Warm, Dry.   ED Treatments / Results  Labs (all labs ordered are listed, but only abnormal results are displayed)   EKG  EKG Interpretation None       Radiology   Procedures Procedures (including critical care time)  Medications Ordered in ED Medications  sodium chloride 0.9 % bolus 1,000 mL (not administered)  ondansetron (ZOFRAN) injection 4 mg (not administered)     Initial Impression / Assessment and Plan / ED Course  I have reviewed the triage vital signs and the nursing notes.  Pertinent labs & imaging results that were available during my care of the patient were reviewed by me and considered in my medical decision making (see chart for details).  MDM Reviewed:  previous chart, nursing note and vitals Reviewed previous: labs Interpretation: labs and x-ray   Results for orders placed or performed during the hospital encounter of 01/01/16  Lipase, blood  Result Value Ref Range   Lipase 21 11 - 51 U/L  Comprehensive metabolic panel  Result Value Ref Range   Sodium 135 135 - 145 mmol/L   Potassium 4.0 3.5 - 5.1 mmol/L   Chloride 105 101 - 111 mmol/L   CO2 22 22 - 32 mmol/L   Glucose, Bld 86 65 - 99 mg/dL   BUN 12 6 - 20 mg/dL   Creatinine, Ser 0.62 0.44 - 1.00 mg/dL   Calcium 9.2 8.9 - 10.3 mg/dL   Total Protein 7.6 6.5 - 8.1 g/dL   Albumin 4.1 3.5 - 5.0 g/dL   AST 14 (L) 15 - 41 U/L   ALT 15 14 - 54 U/L   Alkaline Phosphatase 57 38 - 126 U/L   Total Bilirubin 0.6 0.3 -  1.2 mg/dL   GFR calc non Af Amer >60 >60 mL/min   GFR calc Af Amer >60 >60 mL/min   Anion gap 8 5 - 15  CBC  Result Value Ref Range   WBC 5.8 4.0 - 10.5 K/uL   RBC 4.57 3.87 - 5.11 MIL/uL   Hemoglobin 12.8 12.0 - 15.0 g/dL   HCT 38.6 36.0 - 46.0 %   MCV 84.5 78.0 - 100.0 fL   MCH 28.0 26.0 - 34.0 pg   MCHC 33.2 30.0 - 36.0 g/dL   RDW 14.2 11.5 - 15.5 %   Platelets 289 150 - 400 K/uL  Urinalysis, Routine w reflex microscopic  Result Value Ref Range   Color, Urine YELLOW YELLOW   APPearance CLEAR CLEAR   Specific Gravity, Urine 1.015 1.005 - 1.030   pH 7.0 5.0 - 8.0   Glucose, UA NEGATIVE NEGATIVE mg/dL   Hgb urine dipstick MODERATE (A) NEGATIVE   Bilirubin Urine NEGATIVE NEGATIVE   Ketones, ur NEGATIVE NEGATIVE mg/dL   Protein, ur NEGATIVE NEGATIVE mg/dL   Nitrite NEGATIVE NEGATIVE   Leukocytes, UA NEGATIVE NEGATIVE  Pregnancy, urine  Result Value Ref Range   Preg Test, Ur NEGATIVE NEGATIVE  Urine microscopic-add on  Result Value Ref Range   Squamous Epithelial / LPF 0-5 (A) NONE SEEN   WBC, UA 0-5 0 - 5 WBC/hpf   RBC / HPF 0-5 0 - 5 RBC/hpf   Bacteria, UA RARE (A) NONE SEEN   Dg Abd Acute W/chest Result Date: 01/01/2016 CLINICAL DATA:  Nausea,  vomiting, diarrhea EXAM: DG ABDOMEN ACUTE W/ 1V CHEST COMPARISON:  None. FINDINGS: There is a moderate amount of stool in the colon. There is no evidence of dilated bowel loops or free intraperitoneal air. No radiopaque calculi or other significant radiographic abnormality is seen. Heart size and mediastinal contours are within normal limits. Both lungs are clear. IMPRESSION: Negative abdominal radiographs.  No acute cardiopulmonary disease. Electronically Signed   By: Kathreen Devoid   On: 01/01/2016 13:37    1440:  Pt has tol PO well while in the ED without N/V.  No stooling while in the ED.  Abd remains benign, VSS. Feels better and wants to go home now. Tx symptomatically, f/u PMD. Dx and testing d/w pt.  Questions answered.  Verb understanding, agreeable to d/c home with outpt f/u.    Final Clinical Impressions(s) / ED Diagnoses   Final diagnoses:  None    New Prescriptions New Prescriptions   No medications on file     Francine Graven, DO 01/03/16 2017

## 2016-03-07 ENCOUNTER — Encounter (HOSPITAL_COMMUNITY): Payer: Self-pay | Admitting: *Deleted

## 2016-03-07 ENCOUNTER — Emergency Department (HOSPITAL_COMMUNITY)
Admission: EM | Admit: 2016-03-07 | Discharge: 2016-03-08 | Disposition: A | Discharge status: Home / Self Care | Payer: Self-pay | Attending: Emergency Medicine | Admitting: Emergency Medicine

## 2016-03-07 DIAGNOSIS — M5441 Lumbago with sciatica, right side: Secondary | ICD-10-CM | POA: Insufficient documentation

## 2016-03-07 DIAGNOSIS — M5431 Sciatica, right side: Secondary | ICD-10-CM

## 2016-03-07 NOTE — ED Provider Notes (Signed)
[facility] DEPT Provider Note   CSN: [id number] Arrival date & time: 03/07/16  2341     History   Chief Complaint Chief Complaint  Patient presents with  . Back Pain    HPI Jessica Sanchez is a 30 y.o. female.  Patient presents with complaints of right-sided low back pain that radiates to the right leg. Patient reports that symptoms have been ongoing for more than a month. She denies any injury. No change in bowel or bladder function. Patient reports constant pain in the right lower back that radiates with movement, especially bending and twisting at the hip. She occasionally feels some numbness but there is no weakness in the right leg.      Past Medical History:  Diagnosis Date  . Fibroid (bleeding) (uterine)   . Fibroid, uterine   . H/O metrorrhagia   . IUD (intrauterine device) in place 07/11/2015  . Menorrhagia     Patient Active Problem List   Diagnosis Date Noted  . IUD (intrauterine device) in place 07/11/2015  . Other disorder of menstruation and other abnormal bleeding from female genital tract 10/30/2012    Past Surgical History:  Procedure Laterality Date  . HYSTEROSCOPY W/D&C N/A 09/27/2013   Procedure: DILATATION AND CURETTAGE /HYSTEROSCOPY and insertion of Mirena IUD ;  Surgeon: [name]. Harrington Challenger, MD;  Location: [facility];  Service: Gynecology;  Laterality: N/A;  . MYOMECTOMY  02/03/2011   Procedure: MYOMECTOMY;  Surgeon: [name], MD;  Location: [facility];  Service: Gynecology;  Laterality: N/A;  . TONSILLECTOMY      OB History    Gravida Para Term Preterm AB Living   1       1     SAB TAB Ectopic Multiple Live Births   1               Home Medications    Prior to Admission medications   Medication Sig Start Date End Date Taking? Authorizing Provider  levonorgestrel (MIRENA) 20 MCG/24HR IUD 1 each by Intrauterine route once.   Yes Historical Provider, MD  cyclobenzaprine (FLEXERIL) 10 MG tablet Take 1 tablet (10 mg total) by mouth 3 (three)  times daily as needed for muscle spasms. 03/08/16   Orpah Greek, MD  Multiple Vitamins-Minerals (MULTIVITAMINS THER. W/MINERALS) TABS tablet Take 1 tablet by mouth daily.    Historical Provider, MD  naproxen (NAPROSYN) 500 MG tablet Take 1 tablet (500 mg total) by mouth 2 (two) times daily. 03/08/16   Orpah Greek, MD  ondansetron (ZOFRAN) 4 MG tablet Take 1 tablet (4 mg total) by mouth every 8 (eight) hours as needed for nausea or vomiting. 01/01/16   Francine Graven, DO  predniSONE (DELTASONE) 20 MG tablet 3 tabs po daily x 3 days, then 2 tabs x 3 days, then 1.5 tabs x 3 days, then 1 tab x 3 days, then 0.5 tabs x 3 days 03/08/16   Orpah Greek, MD  traMADol (ULTRAM) 50 MG tablet Take 1 tablet (50 mg total) by mouth every 6 (six) hours as needed. 03/08/16   Orpah Greek, MD    Family History Family History  Problem Relation Age of Onset  . Cirrhosis Mother   . Diabetes Mother   . Cancer Maternal Grandfather   . Hypertension Sister   . Diabetes Sister   . Anesthesia problems Neg Hx   . Hypotension Neg Hx   . Malignant hyperthermia Neg Hx   . Pseudochol deficiency Neg Hx  Social History Social History  Substance Use Topics  . Smoking status: Never Smoker  . Smokeless tobacco: Never Used  . Alcohol use No     Allergies   Patient has no known allergies.   Review of Systems Review of Systems  Musculoskeletal: Positive for back pain.  Neurological: Positive for numbness.  All other systems reviewed and are negative.    Physical Exam Updated Vital Signs BP 135/81   Pulse 78   Temp 97.7 F (36.5 C) (Oral)   Resp 18   Ht 5\' 2"  (1.575 m)   Wt 270 lb (122.5 kg)   SpO2 100%   BMI 49.38 kg/m   Physical Exam  Constitutional: She is oriented to person, place, and time. She appears well-developed and well-nourished. No distress.  HENT:  Head: Normocephalic and atraumatic.  Right Ear: Hearing normal.  Left Ear: Hearing normal.  Nose:  Nose normal.  Mouth/Throat: Oropharynx is clear and moist and mucous membranes are normal.  Eyes: Conjunctivae and EOM are normal. Pupils are equal, round, and reactive to light.  Neck: Normal range of motion. Neck supple.  Cardiovascular: Regular rhythm, S1 normal and S2 normal.  Exam reveals no gallop and no friction rub.   No murmur heard. Pulmonary/Chest: Effort normal and breath sounds normal. No respiratory distress. She exhibits no tenderness.  Abdominal: Soft. Normal appearance and bowel sounds are normal. There is no hepatosplenomegaly. There is no tenderness. There is no rebound, no guarding, no tenderness at McBurney's point and negative Murphy's sign. No hernia.  Musculoskeletal: Normal range of motion.       Lumbar back: She exhibits tenderness (Right SI joint region).       Back:  Neurological: She is alert and oriented to person, place, and time. She has normal strength. No cranial nerve deficit or sensory deficit. Coordination normal. GCS eye subscore is 4. GCS verbal subscore is 5. GCS motor subscore is 6.  Reflex Scores:      Patellar reflexes are 1+ on the right side. Skin: Skin is warm, dry and intact. No rash noted. No cyanosis.  Psychiatric: She has a normal mood and affect. Her speech is normal and behavior is normal. Thought content normal.  Nursing note and vitals reviewed.    ED Treatments / Results  Labs (all labs ordered are listed, but only abnormal results are displayed) Labs Reviewed - No data to display  EKG  EKG Interpretation None       Radiology No results found.  Procedures Procedures (including critical care time)  Medications Ordered in ED Medications  dexamethasone (DECADRON) injection 10 mg (not administered)  ketorolac (TORADOL) injection 60 mg (not administered)     Initial Impression / Assessment and Plan / ED Course  I have reviewed the triage vital signs and the nursing notes.  Pertinent labs & imaging results that were  available during my care of the patient were reviewed by me and considered in my medical decision making (see chart for details).  Clinical Course    Patient presents to the ER with musculoskeletal back pain. Examination reveals back tenderness without any associated neurologic findings. Patient's strength, sensation and reflexes were normal. There is no evidence of saddle anesthesia. Patient does not have a foot drop. Patient has not experienced any change in bowel or bladder function. As such, patient did not require any imaging or further studies. Patient was treated with analgesia.  Final Clinical Impressions(s) / ED Diagnoses   Final diagnoses:  Sciatica of right  side    New Prescriptions New Prescriptions   CYCLOBENZAPRINE (FLEXERIL) 10 MG TABLET    Take 1 tablet (10 mg total) by mouth 3 (three) times daily as needed for muscle spasms.   NAPROXEN (NAPROSYN) 500 MG TABLET    Take 1 tablet (500 mg total) by mouth 2 (two) times daily.   PREDNISONE (DELTASONE) 20 MG TABLET    3 tabs po daily x 3 days, then 2 tabs x 3 days, then 1.5 tabs x 3 days, then 1 tab x 3 days, then 0.5 tabs x 3 days   TRAMADOL (ULTRAM) 50 MG TABLET    Take 1 tablet (50 mg total) by mouth every 6 (six) hours as needed.     Orpah Greek, MD 03/08/16 0002

## 2016-03-07 NOTE — ED Triage Notes (Signed)
Pt c/op lower back pain that radiated down right leg, also c/o swelling to right leg as well that started in September, denies any injury,

## 2016-03-08 MED ORDER — KETOROLAC TROMETHAMINE 60 MG/2ML IM SOLN
60.0000 mg | Freq: Once | INTRAMUSCULAR | Status: AC
  Administered 2016-03-08: 60 mg via INTRAMUSCULAR
  Filled 2016-03-08: qty 2

## 2016-03-08 MED ORDER — CYCLOBENZAPRINE HCL 10 MG PO TABS: 10.0000 mg | ORAL_TABLET | Freq: Three times a day (TID) | ORAL | 0 refills | Status: DC | PRN

## 2016-03-08 MED ORDER — DEXAMETHASONE SODIUM PHOSPHATE 10 MG/ML IJ SOLN
10.0000 mg | Freq: Once | INTRAMUSCULAR | Status: AC
  Administered 2016-03-08: 10 mg via INTRAMUSCULAR
  Filled 2016-03-08: qty 1

## 2016-03-08 MED ORDER — NAPROXEN 500 MG PO TABS: 500.0000 mg | ORAL_TABLET | Freq: Two times a day (BID) | ORAL | 0 refills | Status: DC

## 2016-03-08 MED ORDER — PREDNISONE 20 MG PO TABS: ORAL_TABLET | ORAL | 0 refills | Status: DC

## 2016-03-08 MED ORDER — TRAMADOL HCL 50 MG PO TABS: 50.0000 mg | ORAL_TABLET | Freq: Four times a day (QID) | ORAL | 0 refills | Status: DC | PRN

## 2016-04-14 ENCOUNTER — Encounter (HOSPITAL_COMMUNITY): Payer: Self-pay | Admitting: *Deleted

## 2016-04-14 ENCOUNTER — Emergency Department (HOSPITAL_COMMUNITY): Payer: Self-pay

## 2016-04-14 ENCOUNTER — Emergency Department (HOSPITAL_COMMUNITY)
Admission: EM | Admit: 2016-04-14 | Discharge: 2016-04-14 | Disposition: A | Discharge status: Home / Self Care | Payer: Self-pay | Attending: Emergency Medicine | Admitting: Emergency Medicine

## 2016-04-14 DIAGNOSIS — R079 Chest pain, unspecified: Secondary | ICD-10-CM | POA: Insufficient documentation

## 2016-04-14 DIAGNOSIS — R0602 Shortness of breath: Secondary | ICD-10-CM | POA: Insufficient documentation

## 2016-04-14 DIAGNOSIS — R519 Headache, unspecified: Secondary | ICD-10-CM

## 2016-04-14 DIAGNOSIS — R51 Headache: Secondary | ICD-10-CM | POA: Insufficient documentation

## 2016-04-14 DIAGNOSIS — R350 Frequency of micturition: Secondary | ICD-10-CM | POA: Insufficient documentation

## 2016-04-14 LAB — CBC WITH DIFFERENTIAL/PLATELET
BASOS ABS: 0 10*3/uL (ref 0.0–0.1)
BASOS PCT: 0 %
EOS PCT: 2 %
Eosinophils Absolute: 0.1 10*3/uL (ref 0.0–0.7)
HEMATOCRIT: 38.7 % (ref 36.0–46.0)
Hemoglobin: 12.5 g/dL (ref 12.0–15.0)
LYMPHS PCT: 43 %
Lymphs Abs: 2.9 10*3/uL (ref 0.7–4.0)
MCH: 28 pg (ref 26.0–34.0)
MCHC: 32.3 g/dL (ref 30.0–36.0)
MCV: 86.6 fL (ref 78.0–100.0)
MONO ABS: 0.5 10*3/uL (ref 0.1–1.0)
Monocytes Relative: 8 %
NEUTROS ABS: 3.2 10*3/uL (ref 1.7–7.7)
Neutrophils Relative %: 47 %
PLATELETS: 295 10*3/uL (ref 150–400)
RBC: 4.47 MIL/uL (ref 3.87–5.11)
RDW: 13.9 % (ref 11.5–15.5)
WBC: 6.7 10*3/uL (ref 4.0–10.5)

## 2016-04-14 LAB — BASIC METABOLIC PANEL
ANION GAP: 8 (ref 5–15)
BUN: 14 mg/dL (ref 6–20)
CALCIUM: 9.4 mg/dL (ref 8.9–10.3)
CO2: 23 mmol/L (ref 22–32)
Chloride: 106 mmol/L (ref 101–111)
Creatinine, Ser: 0.6 mg/dL (ref 0.44–1.00)
GFR calc non Af Amer: >60 mL/min (ref >60)
Glucose, Bld: 93 mg/dL (ref 65–99)
POTASSIUM: 4 mmol/L (ref 3.5–5.1)
Sodium: 137 mmol/L (ref 135–145)

## 2016-04-14 MED ORDER — NAPROXEN 500 MG PO TABS: 500.0000 mg | ORAL_TABLET | Freq: Two times a day (BID) | ORAL | 0 refills | Status: DC

## 2016-04-14 NOTE — ED Triage Notes (Signed)
Pt c/o headache for the last couple of days;

## 2016-04-14 NOTE — ED Provider Notes (Signed)
[facility] DEPT Provider Note   CSN: [id number] Arrival date & time: 04/14/16  1842     History   Chief Complaint Chief Complaint  Patient presents with  . Headache    HPI Jessica Sanchez is a 30 y.o. female.  HPI Pt started having a headache a few days ago.   She felt feverish the other day but those symptoms have resolved.  She denies any congestion but she has been coughing.  No neck stiffness.   No vomiting.    Today she also started having pain in her chest.  The pain is in the center.  It feels like a tightness.  No cough. She has felt intermittently short of breath.  No leg swelling.  No history of heart disease , PE or DVT.  No family history of any heart disease.  Past Medical History:  Diagnosis Date  . Fibroid (bleeding) (uterine)   . Fibroid, uterine   . H/O metrorrhagia   . IUD (intrauterine device) in place 07/11/2015  . Menorrhagia     Patient Active Problem List   Diagnosis Date Noted  . IUD (intrauterine device) in place 07/11/2015  . Other disorder of menstruation and other abnormal bleeding from female genital tract 10/30/2012    Past Surgical History:  Procedure Laterality Date  . HYSTEROSCOPY W/D&C N/A 09/27/2013   Procedure: DILATATION AND CURETTAGE /HYSTEROSCOPY and insertion of Mirena IUD ;  Surgeon: [name]. Harrington Challenger, MD;  Location: [facility];  Service: Gynecology;  Laterality: N/A;  . MYOMECTOMY  02/03/2011   Procedure: MYOMECTOMY;  Surgeon: [name], MD;  Location: [facility];  Service: Gynecology;  Laterality: N/A;  . TONSILLECTOMY      OB History    Gravida Para Term Preterm AB Living   1       1     SAB TAB Ectopic Multiple Live Births   1               Home Medications    Prior to Admission medications   Medication Sig Start Date End Date Taking? Authorizing Provider  cyclobenzaprine (FLEXERIL) 10 MG tablet Take 1 tablet (10 mg total) by mouth 3 (three) times daily as needed for muscle spasms. Patient not taking: Reported on  04/14/2016 03/08/16   Orpah Greek, MD  levonorgestrel (MIRENA) 20 MCG/24HR IUD 1 each by Intrauterine route once.    Historical Provider, MD  naproxen (NAPROSYN) 500 MG tablet Take 1 tablet (500 mg total) by mouth 2 (two) times daily. 04/14/16   Dorie Rank, MD  ondansetron (ZOFRAN) 4 MG tablet Take 1 tablet (4 mg total) by mouth every 8 (eight) hours as needed for nausea or vomiting. Patient not taking: Reported on 04/14/2016 01/01/16   Francine Graven, DO  predniSONE (DELTASONE) 20 MG tablet 3 tabs po daily x 3 days, then 2 tabs x 3 days, then 1.5 tabs x 3 days, then 1 tab x 3 days, then 0.5 tabs x 3 days Patient not taking: Reported on 04/14/2016 03/08/16   Orpah Greek, MD  traMADol (ULTRAM) 50 MG tablet Take 1 tablet (50 mg total) by mouth every 6 (six) hours as needed. Patient not taking: Reported on 04/14/2016 03/08/16   Orpah Greek, MD    Family History Family History  Problem Relation Age of Onset  . Cirrhosis Mother   . Diabetes Mother   . Cancer Maternal Grandfather   . Hypertension Sister   . Diabetes Sister   . Anesthesia  problems Neg Hx   . Hypotension Neg Hx   . Malignant hyperthermia Neg Hx   . Pseudochol deficiency Neg Hx     Social History Social History  Substance Use Topics  . Smoking status: Never Smoker  . Smokeless tobacco: Never Used  . Alcohol use No     Allergies   Patient has no known allergies.   Review of Systems Review of Systems  Genitourinary: Positive for frequency.  All other systems reviewed and are negative.    Physical Exam Updated Vital Signs BP 126/82 (BP Location: Left Arm)   Pulse 81   Temp 98.6 F (37 C) (Oral)   Resp 18   Ht 5\' 2"  (1.575 m)   Wt 108.9 kg   SpO2 100%   BMI 43.90 kg/m   Physical Exam  Constitutional: She appears well-developed and well-nourished. No distress.  HENT:  Head: Normocephalic and atraumatic.  Right Ear: External ear normal.  Left Ear: External ear normal.  Eyes:  Conjunctivae are normal. Right eye exhibits no discharge. Left eye exhibits no discharge. No scleral icterus.  Neck: Neck supple. No tracheal deviation present.  Cardiovascular: Normal rate, regular rhythm and intact distal pulses.   Pulmonary/Chest: Effort normal and breath sounds normal. No stridor. No respiratory distress. She has no wheezes. She has no rales.  Abdominal: Soft. Bowel sounds are normal. She exhibits no distension. There is no tenderness. There is no rebound and no guarding.  Musculoskeletal: She exhibits no edema or tenderness.  Neurological: She is alert. She has normal strength. No cranial nerve deficit (no facial droop, extraocular movements intact, no slurred speech) or sensory deficit. She exhibits normal muscle tone. She displays no seizure activity. Coordination normal.  Skin: Skin is warm and dry. No rash noted.  Psychiatric: She has a normal mood and affect.  Nursing note and vitals reviewed.    ED Treatments / Results  Labs (all labs ordered are listed, but only abnormal results are displayed) Labs Reviewed  CBC WITH DIFFERENTIAL/PLATELET  BASIC METABOLIC PANEL    EKG  EKG Interpretation  Date/Time:  Wednesday April 14 2016 20:35:53 EST Ventricular Rate:  85 PR Interval:    QRS Duration: 96 QT Interval:  355 QTC Calculation: 423 R Axis:   59 Text Interpretation:  Sinus rhythm Low voltage, precordial leads No significant change since last tracing Confirmed by Francella Barnett  MD-J, Mikael Skoda [id number]) on 04/14/2016 8:56:44 PM       Radiology Dg Chest 2 View  Result Date: 04/14/2016 CLINICAL DATA:  Headache and chest pain EXAM: CHEST  2 VIEW COMPARISON:  Chest radiograph 01/01/2016 FINDINGS: Mild cardiomegaly. No focal airspace disease or pulmonary edema. No pleural effusion or pneumothorax. IMPRESSION: No active cardiopulmonary disease.  Mild cardiomegaly. Electronically Signed   By: Ulyses Jarred M.D.   On: 04/14/2016 20:58    Procedures Procedures (including  critical care time)  Medications Ordered in ED Medications - No data to display   Initial Impression / Assessment and Plan / ED Course  I have reviewed the triage vital signs and the nursing notes.  Pertinent labs & imaging results that were available during my care of the patient were reviewed by me and considered in my medical decision making (see chart for details).  Clinical Course   ED evaluation is reassuring.  No pna.  EKG without concerning changes.  Labs are normal. Neck is supple, headache is mild.  Doubt meningitis.  Possible viral illness with her complaints of fevers, headaches and chest  discomfort.    Final Clinical Impressions(s) / ED Diagnoses   Final diagnoses:  Headache disorder  Chest pain, unspecified type    New Prescriptions New Prescriptions   NAPROXEN (NAPROSYN) 500 MG TABLET    Take 1 tablet (500 mg total) by mouth 2 (two) times daily.     Dorie Rank, MD 04/14/16 2207

## 2016-04-14 NOTE — Discharge Instructions (Signed)
Take the medications as needed for pain, follow-up with your primary doctor next week if the symptoms have not resolved.

## 2016-04-17 ENCOUNTER — Emergency Department (HOSPITAL_COMMUNITY)
Admission: EM | Admit: 2016-04-17 | Discharge: 2016-04-17 | Disposition: A | Discharge status: Home / Self Care | Payer: Self-pay | Attending: Emergency Medicine | Admitting: Emergency Medicine

## 2016-04-17 ENCOUNTER — Encounter (HOSPITAL_COMMUNITY): Payer: Self-pay | Admitting: Emergency Medicine

## 2016-04-17 DIAGNOSIS — Z79899 Other long term (current) drug therapy: Secondary | ICD-10-CM | POA: Insufficient documentation

## 2016-04-17 DIAGNOSIS — J111 Influenza due to unidentified influenza virus with other respiratory manifestations: Secondary | ICD-10-CM | POA: Insufficient documentation

## 2016-04-17 MED ORDER — LORATADINE-PSEUDOEPHEDRINE ER 5-120 MG PO TB12: 1.0000 | ORAL_TABLET | Freq: Two times a day (BID) | ORAL | 0 refills | Status: DC

## 2016-04-17 MED ORDER — IBUPROFEN 800 MG PO TABS
800.0000 mg | ORAL_TABLET | Freq: Once | ORAL | Status: AC
  Administered 2016-04-17: 800 mg via ORAL
  Filled 2016-04-17: qty 1

## 2016-04-17 MED ORDER — HYDROCOD POLST-CPM POLST ER 10-8 MG/5ML PO SUER
5.0000 mL | Freq: Once | ORAL | Status: AC
  Administered 2016-04-17: 5 mL via ORAL
  Filled 2016-04-17: qty 5

## 2016-04-17 MED ORDER — HYDROCODONE-HOMATROPINE 5-1.5 MG/5ML PO SYRP: 5.0000 mL | ORAL_SOLUTION | Freq: Four times a day (QID) | ORAL | 0 refills | Status: DC | PRN

## 2016-04-17 MED ORDER — IBUPROFEN 600 MG PO TABS: 600.0000 mg | ORAL_TABLET | Freq: Four times a day (QID) | ORAL | 0 refills | Status: DC | PRN

## 2016-04-17 NOTE — Discharge Instructions (Signed)
Please wash her hands frequently. Please usual mask until symptoms have resolved. Use 600 mg of ibuprofen every 6 hours over the next few days, and then every 6 hours as needed. Please use Claritin-D every 12 hours for congestion and sinus drainage. Use Hycodan for cough. Keep your distance from others until symptoms have resolved. Please see Dr. Lorriane Shire, or return to the emergency department if any changes, problems, or concerns.

## 2016-04-17 NOTE — ED Triage Notes (Signed)
Pt reports sore throat, cough, nasal congestion, chills and generalized body aches.

## 2016-04-17 NOTE — ED Provider Notes (Signed)
[facility] DEPT Provider Note   CSN: [id number] Arrival date & time: 04/17/16  1821     History   Chief Complaint Chief Complaint  Patient presents with  . Flu like symptoms    HPI Jessica Sanchez is a 30 y.o. female.  Patient is a 30 year old female who presents to the emergency department with a complaint of flulike symptoms.  The patient states that over the past week she has been having problems with cough, sore throat, nasal congestion, chills, and fever. She complains of generalized body aches from time to time. Her temperature maximum has been 101.2 on yesterday. She states that several people on the shift that she works have been ill, her grandmother has been ill recently, and she has got children who come in and out of the house and states that many of their classmates have been ill. She has tried some over-the-counter medications without success. She presents to the emergency department now for assistance with these issues.   The history is provided by the patient.    Past Medical History:  Diagnosis Date  . Fibroid (bleeding) (uterine)   . Fibroid, uterine   . H/O metrorrhagia   . IUD (intrauterine device) in place 07/11/2015  . Menorrhagia     Patient Active Problem List   Diagnosis Date Noted  . IUD (intrauterine device) in place 07/11/2015  . Other disorder of menstruation and other abnormal bleeding from female genital tract 10/30/2012    Past Surgical History:  Procedure Laterality Date  . HYSTEROSCOPY W/D&C N/A 09/27/2013   Procedure: DILATATION AND CURETTAGE /HYSTEROSCOPY and insertion of Mirena IUD ;  Surgeon: [name]. Harrington Challenger, MD;  Location: [facility];  Service: Gynecology;  Laterality: N/A;  . MYOMECTOMY  02/03/2011   Procedure: MYOMECTOMY;  Surgeon: [name], MD;  Location: [facility];  Service: Gynecology;  Laterality: N/A;  . TONSILLECTOMY      OB History    Gravida Para Term Preterm AB Living   1       1     SAB TAB Ectopic Multiple Live  Births   1               Home Medications    Prior to Admission medications   Medication Sig Start Date End Date Taking? Authorizing Provider  levonorgestrel (MIRENA) 20 MCG/24HR IUD 1 each by Intrauterine route once.   Yes Historical Provider, MD  Pseudoephedrine-APAP-DM (DAYQUIL MULTI-SYMPTOM PO) Take 1 tablet by mouth 2 (two) times daily.   Yes Historical Provider, MD  HYDROcodone-homatropine (HYCODAN) 5-1.5 MG/5ML syrup Take 5 mLs by mouth every 6 (six) hours as needed. 04/17/16   Lily Kocher, PA-C  ibuprofen (ADVIL,MOTRIN) 600 MG tablet Take 1 tablet (600 mg total) by mouth every 6 (six) hours as needed. 04/17/16   Lily Kocher, PA-C  loratadine-pseudoephedrine (CLARITIN-D 12 HOUR) 5-120 MG tablet Take 1 tablet by mouth 2 (two) times daily. 04/17/16   Lily Kocher, PA-C    Family History Family History  Problem Relation Age of Onset  . Cirrhosis Mother   . Diabetes Mother   . Cancer Maternal Grandfather   . Hypertension Sister   . Diabetes Sister   . Anesthesia problems Neg Hx   . Hypotension Neg Hx   . Malignant hyperthermia Neg Hx   . Pseudochol deficiency Neg Hx     Social History Social History  Substance Use Topics  . Smoking status: Never Smoker  . Smokeless tobacco: Never Used  . Alcohol use  No     Allergies   Patient has no known allergies.   Review of Systems Review of Systems  Constitutional: Positive for appetite change, chills, fatigue and fever. Negative for activity change.       All ROS Neg except as noted in HPI  HENT: Positive for congestion, postnasal drip and sore throat. Negative for nosebleeds.   Eyes: Negative for photophobia and discharge.  Respiratory: Positive for cough. Negative for shortness of breath and wheezing.   Cardiovascular: Negative for chest pain and palpitations.  Gastrointestinal: Negative for abdominal pain and blood in stool.  Genitourinary: Negative for dysuria, frequency and hematuria.  Musculoskeletal: Positive  for myalgias. Negative for arthralgias, back pain and neck pain.  Skin: Negative.   Neurological: Negative for dizziness, seizures and speech difficulty.  Psychiatric/Behavioral: Negative for confusion and hallucinations.     Physical Exam Updated Vital Signs BP 114/75 (BP Location: Left Arm)   Pulse 88   Temp 98.4 F (36.9 C) (Oral)   Resp 16   Ht 5\' 2"  (1.575 m)   Wt 108.9 kg   SpO2 99%   BMI 43.90 kg/m   Physical Exam  Constitutional: She is oriented to person, place, and time. She appears well-developed and well-nourished.  Non-toxic appearance.  HENT:  Head: Normocephalic.  Right Ear: Tympanic membrane and external ear normal.  Left Ear: Tympanic membrane and external ear normal.  Nasal congestion present.  The uvula is in the midline. The airway is patent. No evidence of tonsillar abscess appreciated.  No swelling or redness involving the mastoid area.  Eyes: EOM and lids are normal. Pupils are equal, round, and reactive to light.  Neck: Normal range of motion. Neck supple. Carotid bruit is not present.  Cardiovascular: Normal rate, regular rhythm, normal heart sounds, intact distal pulses and normal pulses.  Exam reveals no gallop and no friction rub.   No murmur heard. Pulmonary/Chest: Breath sounds normal. No respiratory distress. She has no wheezes.  Abdominal: Soft. Bowel sounds are normal. There is no tenderness. There is no guarding.  Musculoskeletal: Normal range of motion.  Lymphadenopathy:       Head (right side): No submandibular adenopathy present.       Head (left side): No submandibular adenopathy present.    She has no cervical adenopathy.  Neurological: She is alert and oriented to person, place, and time. She has normal strength. No cranial nerve deficit or sensory deficit.  Skin: Skin is warm and dry. No rash noted.  Psychiatric: She has a normal mood and affect. Her speech is normal.  Nursing note and vitals reviewed.    ED Treatments /  Results  Labs (all labs ordered are listed, but only abnormal results are displayed) Labs Reviewed - No data to display  EKG  EKG Interpretation None       Radiology No results found.  Procedures Procedures (including critical care time)  Medications Ordered in ED Medications  chlorpheniramine-HYDROcodone (TUSSIONEX) 10-8 MG/5ML suspension 5 mL (not administered)  ibuprofen (ADVIL,MOTRIN) tablet 800 mg (not administered)     Initial Impression / Assessment and Plan / ED Course  I have reviewed the triage vital signs and the nursing notes.  Pertinent labs & imaging results that were available during my care of the patient were reviewed by me and considered in my medical decision making (see chart for details).  Clinical Course     **I have reviewed nursing notes, vital signs, and all appropriate lab and imaging results for this  patient.*  Final Clinical Impressions(s) / ED Diagnoses MDM Patient has a temperature max of 101+. She has body aches, sore throat, congestion, and generally not feeling well. I suspect the patient has influenza of some form. I've asked patient to use ibuprofen every 6 hours, given a prescription for cough medication. I've also asked her to use Claritin-D every 12 hours for congestion. We talked about the importance of good handwashing, good hydration, and the use of her mask until symptoms have resolved. Patient knowledge is understanding of the instructions and is in agreement with this plan.    Final diagnoses:  Influenza    New Prescriptions New Prescriptions   HYDROCODONE-HOMATROPINE (HYCODAN) 5-1.5 MG/5ML SYRUP    Take 5 mLs by mouth every 6 (six) hours as needed.   IBUPROFEN (ADVIL,MOTRIN) 600 MG TABLET    Take 1 tablet (600 mg total) by mouth every 6 (six) hours as needed.   LORATADINE-PSEUDOEPHEDRINE (CLARITIN-D 12 HOUR) 5-120 MG TABLET    Take 1 tablet by mouth 2 (two) times daily.     Lily Kocher, PA-C 04/17/16 [place], DO 04/19/16 Quentin Mulling

## 2016-05-22 ENCOUNTER — Encounter (HOSPITAL_COMMUNITY): Payer: Self-pay | Admitting: Emergency Medicine

## 2016-05-22 ENCOUNTER — Emergency Department (HOSPITAL_COMMUNITY): Payer: Self-pay

## 2016-05-22 ENCOUNTER — Emergency Department (HOSPITAL_COMMUNITY)
Admission: EM | Admit: 2016-05-22 | Discharge: 2016-05-22 | Disposition: A | Discharge status: Home / Self Care | Payer: Self-pay | Attending: Emergency Medicine | Admitting: Emergency Medicine

## 2016-05-22 DIAGNOSIS — R109 Unspecified abdominal pain: Secondary | ICD-10-CM

## 2016-05-22 DIAGNOSIS — K5900 Constipation, unspecified: Secondary | ICD-10-CM | POA: Insufficient documentation

## 2016-05-22 LAB — CBC WITH DIFFERENTIAL/PLATELET
BASOS PCT: 0 %
Basophils Absolute: 0 10*3/uL (ref 0.0–0.1)
EOS ABS: 0.1 10*3/uL (ref 0.0–0.7)
Eosinophils Relative: 1 %
HEMATOCRIT: 37.1 % (ref 36.0–46.0)
HEMOGLOBIN: 12.5 g/dL (ref 12.0–15.0)
Lymphocytes Relative: 43 %
Lymphs Abs: 2.5 10*3/uL (ref 0.7–4.0)
MCH: 28.6 pg (ref 26.0–34.0)
MCHC: 33.7 g/dL (ref 30.0–36.0)
MCV: 84.9 fL (ref 78.0–100.0)
Monocytes Absolute: 0.4 10*3/uL (ref 0.1–1.0)
Monocytes Relative: 7 %
NEUTROS ABS: 2.8 10*3/uL (ref 1.7–7.7)
NEUTROS PCT: 49 %
Platelets: 280 10*3/uL (ref 150–400)
RBC: 4.37 MIL/uL (ref 3.87–5.11)
RDW: 13.7 % (ref 11.5–15.5)
WBC: 5.8 10*3/uL (ref 4.0–10.5)

## 2016-05-22 LAB — COMPREHENSIVE METABOLIC PANEL
ALBUMIN: 3.9 g/dL (ref 3.5–5.0)
ALK PHOS: 62 U/L (ref 38–126)
ALT: 17 U/L (ref 14–54)
AST: 14 U/L — ABNORMAL LOW (ref 15–41)
Anion gap: 8 (ref 5–15)
BILIRUBIN TOTAL: 0.4 mg/dL (ref 0.3–1.2)
BUN: 12 mg/dL (ref 6–20)
CALCIUM: 9.6 mg/dL (ref 8.9–10.3)
CO2: 24 mmol/L (ref 22–32)
Chloride: 106 mmol/L (ref 101–111)
Creatinine, Ser: 0.58 mg/dL (ref 0.44–1.00)
GFR calc Af Amer: >60 mL/min (ref >60)
GFR calc non Af Amer: >60 mL/min (ref >60)
GLUCOSE: 88 mg/dL (ref 65–99)
Potassium: 3.9 mmol/L (ref 3.5–5.1)
SODIUM: 138 mmol/L (ref 135–145)
TOTAL PROTEIN: 7.3 g/dL (ref 6.5–8.1)

## 2016-05-22 LAB — URINALYSIS, ROUTINE W REFLEX MICROSCOPIC
BILIRUBIN URINE: NEGATIVE
Glucose, UA: NEGATIVE mg/dL
KETONES UR: NEGATIVE mg/dL
LEUKOCYTES UA: NEGATIVE
NITRITE: NEGATIVE
PH: 5 (ref 5.0–8.0)
Protein, ur: NEGATIVE mg/dL
SPECIFIC GRAVITY, URINE: 1.021 (ref 1.005–1.030)

## 2016-05-22 LAB — LIPASE, BLOOD: Lipase: 15 U/L (ref 11–51)

## 2016-05-22 MED ORDER — SODIUM CHLORIDE 0.9 % IV BOLUS (SEPSIS)
1000.0000 mL | Freq: Once | INTRAVENOUS | Status: AC
  Administered 2016-05-22: 1000 mL via INTRAVENOUS

## 2016-05-22 MED ORDER — SODIUM CHLORIDE 0.9 % IV BOLUS (SEPSIS)
1000.0000 mL | Freq: Once | INTRAVENOUS | Status: AC
Start: 2016-05-22 — End: 2016-05-22
  Administered 2016-05-22: 1000 mL via INTRAVENOUS

## 2016-05-22 MED ORDER — FENTANYL CITRATE (PF) 100 MCG/2ML IJ SOLN
50.0000 ug | Freq: Once | INTRAMUSCULAR | Status: AC
  Administered 2016-05-22: 50 ug via INTRAVENOUS
  Filled 2016-05-22: qty 2

## 2016-05-22 MED ORDER — MORPHINE SULFATE (PF) 4 MG/ML IV SOLN
4.0000 mg | Freq: Once | INTRAVENOUS | Status: AC
  Administered 2016-05-22: 4 mg via INTRAVENOUS
  Filled 2016-05-22: qty 1

## 2016-05-22 MED ORDER — ONDANSETRON HCL 4 MG/2ML IJ SOLN
4.0000 mg | Freq: Once | INTRAMUSCULAR | Status: AC
  Administered 2016-05-22: 4 mg via INTRAVENOUS
  Filled 2016-05-22: qty 2

## 2016-05-22 NOTE — ED Triage Notes (Signed)
Pt reports abd pain x 3 days without nausea/vomiting/diarrhea. Last normal BM was 3-4 days ago

## 2016-05-22 NOTE — Discharge Instructions (Signed)
All your blood work was normal. X-ray shows lots of stool in your intestine consistent with constipation. Recommend Miralax, magnesium citrate, Dulcolax, fleets enema, increasing fluid, fruit, fiber in your diet.

## 2016-05-22 NOTE — ED Provider Notes (Signed)
[facility] DEPT Provider Note   CSN: [id number] Arrival date & time: 05/22/16  1446     History   Chief Complaint Chief Complaint  Patient presents with  . Abdominal Pain    HPI Jessica Sanchez is a 31 y.o. female.  Generalized abdominal pain for several days described as sharp and burning. Last good bowel movement several days ago. Review systems positive for nausea but no vomiting or diarrhea. Low-grade fever. She has had a "partial hysterectomy" in the past for uterine fibroids. She has been eating. No fever, sweats, chills, dysuria, vaginal bleeding, vaginal discharge      Past Medical History:  Diagnosis Date  . Fibroid (bleeding) (uterine)   . Fibroid, uterine   . H/O metrorrhagia   . IUD (intrauterine device) in place 07/11/2015  . Menorrhagia     Patient Active Problem List   Diagnosis Date Noted  . IUD (intrauterine device) in place 07/11/2015  . Other disorder of menstruation and other abnormal bleeding from female genital tract 10/30/2012    Past Surgical History:  Procedure Laterality Date  . HYSTEROSCOPY W/D&C N/A 09/27/2013   Procedure: DILATATION AND CURETTAGE /HYSTEROSCOPY and insertion of Mirena IUD ;  Surgeon: [name]. Harrington Challenger, MD;  Location: [facility];  Service: Gynecology;  Laterality: N/A;  . MYOMECTOMY  02/03/2011   Procedure: MYOMECTOMY;  Surgeon: [name], MD;  Location: [facility];  Service: Gynecology;  Laterality: N/A;  . TONSILLECTOMY      OB History    Gravida Para Term Preterm AB Living   1       1     SAB TAB Ectopic Multiple Live Births   1               Home Medications    Prior to Admission medications   Medication Sig Start Date End Date Taking? Authorizing Provider  levonorgestrel (MIRENA) 20 MCG/24HR IUD 1 each by Intrauterine route once.   Yes Historical Provider, MD  HYDROcodone-homatropine (HYCODAN) 5-1.5 MG/5ML syrup Take 5 mLs by mouth every 6 (six) hours as needed. Patient not taking: Reported on 05/22/2016 04/17/16    Lily Kocher, PA-C  ibuprofen (ADVIL,MOTRIN) 600 MG tablet Take 1 tablet (600 mg total) by mouth every 6 (six) hours as needed. Patient not taking: Reported on 05/22/2016 04/17/16   Lily Kocher, PA-C  loratadine-pseudoephedrine (CLARITIN-D 12 HOUR) 5-120 MG tablet Take 1 tablet by mouth 2 (two) times daily. Patient not taking: Reported on 05/22/2016 04/17/16   Lily Kocher, PA-C    Family History Family History  Problem Relation Age of Onset  . Cirrhosis Mother   . Diabetes Mother   . Cancer Maternal Grandfather   . Hypertension Sister   . Diabetes Sister   . Anesthesia problems Neg Hx   . Hypotension Neg Hx   . Malignant hyperthermia Neg Hx   . Pseudochol deficiency Neg Hx     Social History Social History  Substance Use Topics  . Smoking status: Never Smoker  . Smokeless tobacco: Never Used  . Alcohol use No     Allergies   Patient has no known allergies.   Review of Systems Review of Systems  All other systems reviewed and are negative.    Physical Exam Updated Vital Signs BP 116/78   Pulse 87   Temp 97.7 F (36.5 C) (Oral)   Resp 18   Ht 5\' 2"  (1.575 m)   Wt 275 lb (124.7 kg)   SpO2 97%   BMI  50.30 kg/m   Physical Exam  Constitutional: She is oriented to person, place, and time. She appears well-developed and well-nourished.  Obese, no acute distress  HENT:  Head: Normocephalic and atraumatic.  Eyes: Conjunctivae are normal.  Neck: Neck supple.  Cardiovascular: Normal rate and regular rhythm.   Pulmonary/Chest: Effort normal and breath sounds normal.  Abdominal: Soft. Bowel sounds are normal.  Minimal generalized abdominal tenderness, but no acute abdomen  Musculoskeletal: Normal range of motion.  Neurological: She is alert and oriented to person, place, and time.  Skin: Skin is warm and dry.  Psychiatric: She has a normal mood and affect. Her behavior is normal.  Nursing note and vitals reviewed.    ED Treatments / Results  Labs (all  labs ordered are listed, but only abnormal results are displayed) Labs Reviewed  COMPREHENSIVE METABOLIC PANEL - Abnormal; Notable for the following:       Result Value   AST 14 (*)    All other components within normal limits  URINALYSIS, ROUTINE W REFLEX MICROSCOPIC - Abnormal; Notable for the following:    APPearance HAZY (*)    Hgb urine dipstick MODERATE (*)    Bacteria, UA RARE (*)    All other components within normal limits  CBC WITH DIFFERENTIAL/PLATELET  LIPASE, BLOOD    EKG  EKG Interpretation None       Radiology Dg Abdomen Acute W/chest  Result Date: 05/22/2016 CLINICAL DATA:  Mid to lower abdominal pain for 3 days. EXAM: DG ABDOMEN ACUTE W/ 1V CHEST COMPARISON:  Chest x-ray April 14, 2016 FINDINGS: Mild cardiomegaly. Mild atelectasis in the left lung base. The chest is otherwise normal. No free air, portal venous gas, or pneumatosis in the abdomen. Fecal loading seen in the colon. The abdomen is otherwise normal. An IUD is seen in the pelvis. IMPRESSION: Fecal loading in the colon.  No other abnormalities. Electronically Signed   By: Dorise Bullion III M.D   On: 05/22/2016 17:55    Procedures Procedures (including critical care time)  Medications Ordered in ED Medications  sodium chloride 0.9 % bolus 1,000 mL (0 mLs Intravenous Stopped 05/22/16 1823)  ondansetron (ZOFRAN) injection 4 mg (4 mg Intravenous Given 05/22/16 1633)  morphine 4 MG/ML injection 4 mg (4 mg Intravenous Given 05/22/16 1634)  sodium chloride 0.9 % bolus 1,000 mL (1,000 mLs Intravenous New Bag/Given 05/22/16 1824)  fentaNYL (SUBLIMAZE) injection 50 mcg (50 mcg Intravenous Given 05/22/16 1824)     Initial Impression / Assessment and Plan / ED Course  I have reviewed the triage vital signs and the nursing notes.  Pertinent labs & imaging results that were available during my care of the patient were reviewed by me and considered in my medical decision making (see chart for details).      Patient is in no acute distress. Acute abdominal series shows no bowel obstruction, but lots of stool. White count and liver functions all normal. Suspect constipation as the etiology of her pain. This was discussed with the patient and her family.  Final Clinical Impressions(s) / ED Diagnoses   Final diagnoses:  Abdominal pain, unspecified abdominal location  Constipation, unspecified constipation type    New Prescriptions New Prescriptions   No medications on file     Nat Christen, MD 05/22/16 2045

## 2016-05-24 ENCOUNTER — Emergency Department (HOSPITAL_COMMUNITY)
Admission: EM | Admit: 2016-05-24 | Discharge: 2016-05-24 | Disposition: A | Discharge status: Home / Self Care | Payer: Self-pay | Attending: Emergency Medicine | Admitting: Emergency Medicine

## 2016-05-24 ENCOUNTER — Encounter (HOSPITAL_COMMUNITY): Payer: Self-pay | Admitting: *Deleted

## 2016-05-24 DIAGNOSIS — M545 Low back pain, unspecified: Secondary | ICD-10-CM

## 2016-05-24 DIAGNOSIS — Y929 Unspecified place or not applicable: Secondary | ICD-10-CM | POA: Insufficient documentation

## 2016-05-24 DIAGNOSIS — Y939 Activity, unspecified: Secondary | ICD-10-CM | POA: Insufficient documentation

## 2016-05-24 DIAGNOSIS — W19XXXA Unspecified fall, initial encounter: Secondary | ICD-10-CM

## 2016-05-24 DIAGNOSIS — W01198A Fall on same level from slipping, tripping and stumbling with subsequent striking against other object, initial encounter: Secondary | ICD-10-CM | POA: Insufficient documentation

## 2016-05-24 DIAGNOSIS — R3121 Asymptomatic microscopic hematuria: Secondary | ICD-10-CM | POA: Insufficient documentation

## 2016-05-24 DIAGNOSIS — R300 Dysuria: Secondary | ICD-10-CM | POA: Insufficient documentation

## 2016-05-24 DIAGNOSIS — Y999 Unspecified external cause status: Secondary | ICD-10-CM | POA: Insufficient documentation

## 2016-05-24 LAB — URINALYSIS, ROUTINE W REFLEX MICROSCOPIC
Bilirubin Urine: NEGATIVE
Glucose, UA: NEGATIVE mg/dL
Ketones, ur: NEGATIVE mg/dL
Leukocytes, UA: NEGATIVE
Nitrite: NEGATIVE
Protein, ur: NEGATIVE mg/dL
SPECIFIC GRAVITY, URINE: 1.021 (ref 1.005–1.030)
pH: 6 (ref 5.0–8.0)

## 2016-05-24 LAB — POC URINE PREG, ED: PREG TEST UR: NEGATIVE

## 2016-05-24 MED ORDER — NAPROXEN 250 MG PO TABS: 250.0000 mg | ORAL_TABLET | Freq: Two times a day (BID) | ORAL | 0 refills | Status: DC

## 2016-05-24 NOTE — ED Triage Notes (Signed)
Pt slipped on ice yesterday and fell, landed on her bottom.  C/o pain to mid back.

## 2016-05-24 NOTE — ED Provider Notes (Signed)
[facility] DEPT Provider Note   CSN: [id number] Arrival date & time: 05/24/16  [id number]   By signing my name below, I, [name], attest that this documentation has been prepared under the direction and in the presence of Waynetta Pean, PA-C Electronically Signed: [name], Scribe. 05/24/16. 11:40 AM.  History   Chief Complaint Chief Complaint  Patient presents with  . Fall    HPI Comments: Jessica Sanchez is a 31 y.o. female who presents to the Emergency Department complaining of constant back pain s/p a mechanical fall that happened yesterday. Patient states she slipped on ice and fell backward yesterday. Patient states she did hit her head, but denies LOC or injury.  Patient also complains of dysuria and frequency that began one week ago. No modifying factors indicated. She denies any hematuria, loss of bladder/bowel control, Numbness, tingling, weakness, abdominal pain, nausea, vomiting fever, or any other symptoms.    The history is provided by the patient. No language interpreter was used.    Past Medical History:  Diagnosis Date  . Fibroid (bleeding) (uterine)   . Fibroid, uterine   . H/O metrorrhagia   . IUD (intrauterine device) in place 07/11/2015  . Menorrhagia     Patient Active Problem List   Diagnosis Date Noted  . IUD (intrauterine device) in place 07/11/2015  . Other disorder of menstruation and other abnormal bleeding from female genital tract 10/30/2012    Past Surgical History:  Procedure Laterality Date  . HYSTEROSCOPY W/D&C N/A 09/27/2013   Procedure: DILATATION AND CURETTAGE /HYSTEROSCOPY and insertion of Mirena IUD ;  Surgeon: [name]. Harrington Challenger, MD;  Location: [facility];  Service: Gynecology;  Laterality: N/A;  . MYOMECTOMY  02/03/2011   Procedure: MYOMECTOMY;  Surgeon: [name], MD;  Location: [facility];  Service: Gynecology;  Laterality: N/A;  . TONSILLECTOMY      OB History    Gravida Para Term Preterm AB Living   1       1     SAB TAB  Ectopic Multiple Live Births   1               Home Medications    Prior to Admission medications   Medication Sig Start Date End Date Taking? Authorizing Provider  levonorgestrel (MIRENA) 20 MCG/24HR IUD 1 each by Intrauterine route once.   Yes Historical Provider, MD  Multiple Vitamins-Calcium (ONE-A-DAY WOMENS PO) Take 1 tablet by mouth daily.   Yes Historical Provider, MD  naproxen (NAPROSYN) 250 MG tablet Take 1 tablet (250 mg total) by mouth 2 (two) times daily with a meal. 05/24/16   Waynetta Pean, PA-C    Family History Family History  Problem Relation Age of Onset  . Cirrhosis Mother   . Diabetes Mother   . Cancer Maternal Grandfather   . Hypertension Sister   . Diabetes Sister   . Anesthesia problems Neg Hx   . Hypotension Neg Hx   . Malignant hyperthermia Neg Hx   . Pseudochol deficiency Neg Hx     Social History Social History  Substance Use Topics  . Smoking status: Never Smoker  . Smokeless tobacco: Never Used  . Alcohol use No     Allergies   Patient has no known allergies.   Review of Systems Review of Systems  Constitutional: Negative for chills and fever.  HENT: Negative for nosebleeds.   Gastrointestinal: Negative for abdominal pain, nausea and vomiting.  Genitourinary: Positive for dysuria and frequency. Negative for difficulty urinating  and hematuria.  Musculoskeletal: Positive for back pain. Negative for myalgias and neck pain.  Skin: Negative for rash and wound.  Neurological: Negative for dizziness, syncope, weakness, light-headedness, numbness and headaches.     Physical Exam Updated Vital Signs BP 121/76 (BP Location: Right Arm)   Pulse 74   Temp 99.3 F (37.4 C) (Oral)   Resp 16   Ht 5\' 2"  (1.575 m)   Wt 124.7 kg   SpO2 100%   BMI 50.30 kg/m   Physical Exam  Constitutional: She is oriented to person, place, and time. She appears well-developed and well-nourished. No distress.  Nontoxic appearing.  HENT:  Head:  Normocephalic and atraumatic.  Right Ear: External ear normal.  Left Ear: External ear normal.  Mouth/Throat: Oropharynx is clear and moist.  No visible signs of head trauma.  Eyes: Conjunctivae and EOM are normal. Pupils are equal, round, and reactive to light. Right eye exhibits no discharge. Left eye exhibits no discharge.  Neck: Normal range of motion. Neck supple. No JVD present. No tracheal deviation present.  Cardiovascular: Normal rate, regular rhythm, normal heart sounds and intact distal pulses.   Pulmonary/Chest: Effort normal and breath sounds normal. No stridor. No respiratory distress. She has no wheezes. She has no rales.  Abdominal: Soft. Bowel sounds are normal. She exhibits no mass. There is no tenderness. There is no guarding.  Abdomen soft and nontender to palpation.  Musculoskeletal: Normal range of motion. She exhibits tenderness. She exhibits no edema or deformity.  Bilateral low back muscular tenderness to palpation. No midline neck or back tenderness. No back erythema, deformity, ecchymosis or warmth. Good strength in her bilateral lower extremities. Normal gait.  Lymphadenopathy:    She has no cervical adenopathy.  Neurological: She is alert and oriented to person, place, and time. She displays normal reflexes. No cranial nerve deficit or sensory deficit. Coordination normal.  Bilateral patellar DTRs are intact. Sensation is intact her bilateral upper and lower extremities. Normal gait.  Skin: Skin is warm and dry. Capillary refill takes less than 2 seconds. No rash noted. She is not diaphoretic. No erythema. No pallor.  Psychiatric: She has a normal mood and affect. Her behavior is normal.  Nursing note and vitals reviewed.    ED Treatments / Results  DIAGNOSTIC STUDIES: Oxygen Saturation is 100% on RA, normal by my interpretation.    COORDINATION OF CARE: 11:40 AM Discussed treatment plan with pt at bedside and pt agreed to plan.  Labs (all labs ordered are  listed, but only abnormal results are displayed) Labs Reviewed  URINALYSIS, ROUTINE W REFLEX MICROSCOPIC - Abnormal; Notable for the following:       Result Value   APPearance HAZY (*)    Hgb urine dipstick MODERATE (*)    Bacteria, UA RARE (*)    All other components within normal limits  POC URINE PREG, ED    EKG  EKG Interpretation None       Radiology Dg Abdomen Acute W/chest  Result Date: 05/22/2016 CLINICAL DATA:  Mid to lower abdominal pain for 3 days. EXAM: DG ABDOMEN ACUTE W/ 1V CHEST COMPARISON:  Chest x-ray April 14, 2016 FINDINGS: Mild cardiomegaly. Mild atelectasis in the left lung base. The chest is otherwise normal. No free air, portal venous gas, or pneumatosis in the abdomen. Fecal loading seen in the colon. The abdomen is otherwise normal. An IUD is seen in the pelvis. IMPRESSION: Fecal loading in the colon.  No other abnormalities. Electronically Signed  By: Dorise Bullion III M.D   On: 05/22/2016 17:55    Procedures Procedures (including critical care time)  Medications Ordered in ED Medications - No data to display   Initial Impression / Assessment and Plan / ED Course  I have reviewed the triage vital signs and the nursing notes.  Pertinent labs & imaging results that were available during my care of the patient were reviewed by me and considered in my medical decision making (see chart for details).   This  is a 31 y.o. female who presents to the Emergency Department complaining of constant back pain s/p a mechanical fall that happened yesterday. Patient states she slipped on ice and fell backward yesterday. Patient states she did hit her head, but denies LOC or injury.  Patient also complains of dysuria and frequency that began one week ago. No modifying factors indicated. She denies any hematuria. On exam the patient is afebrile nontoxic appearing. Her abdomen is soft nontender to palpation. She has no focal neurological deficits. No midline neck or  back tenderness. Bilateral patellar DTRs are intact. Normal gait. Abdomen is soft and nontender. Pregnancy test is negative. Urinalysis shows moderate hemoglobin. It is nitrite and leukocyte negative. Patient has chronic microscopic hematuria. She tells me she's been told this many times previously. I encouraged her to follow-up with her primary care doctor for this hematuria. I advised the patient to follow-up with their primary care provider this week. I advised the patient to return to the emergency department with new or worsening symptoms or new concerns. The patient verbalized understanding and agreement with plan.    This patient was discussed with Dr. Stark Jock who agrees with assessment and plan.  Final Clinical Impressions(s) / ED Diagnoses   Final diagnoses:  Fall, initial encounter  Acute bilateral low back pain without sciatica  Asymptomatic microscopic hematuria    New Prescriptions Discharge Medication List as of 05/24/2016 12:21 PM    START taking these medications   Details  naproxen (NAPROSYN) 250 MG tablet Take 1 tablet (250 mg total) by mouth 2 (two) times daily with a meal., Starting Mon 05/24/2016, Print       I personally performed the services described in this documentation, which was scribed in my presence. The recorded information has been reviewed and is accurate.       Waynetta Pean, PA-C 05/24/16 [place], MD 05/24/16 [phone]

## 2016-05-24 NOTE — ED Notes (Signed)
Pt states that she did hit the back of her head on a board with fall and c/o HA since.  Denies LOC

## 2016-06-14 ENCOUNTER — Emergency Department (HOSPITAL_COMMUNITY)
Admission: EM | Admit: 2016-06-14 | Discharge: 2016-06-14 | Disposition: A | Discharge status: Home / Self Care | Payer: Self-pay | Attending: Emergency Medicine | Admitting: Emergency Medicine

## 2016-06-14 ENCOUNTER — Encounter (HOSPITAL_COMMUNITY): Payer: Self-pay | Admitting: Emergency Medicine

## 2016-06-14 DIAGNOSIS — R51 Headache: Secondary | ICD-10-CM | POA: Insufficient documentation

## 2016-06-14 DIAGNOSIS — R197 Diarrhea, unspecified: Secondary | ICD-10-CM | POA: Insufficient documentation

## 2016-06-14 DIAGNOSIS — Z79899 Other long term (current) drug therapy: Secondary | ICD-10-CM | POA: Insufficient documentation

## 2016-06-14 DIAGNOSIS — R69 Illness, unspecified: Secondary | ICD-10-CM

## 2016-06-14 DIAGNOSIS — R05 Cough: Secondary | ICD-10-CM | POA: Insufficient documentation

## 2016-06-14 DIAGNOSIS — R1110 Vomiting, unspecified: Secondary | ICD-10-CM | POA: Insufficient documentation

## 2016-06-14 DIAGNOSIS — R067 Sneezing: Secondary | ICD-10-CM | POA: Insufficient documentation

## 2016-06-14 DIAGNOSIS — J111 Influenza due to unidentified influenza virus with other respiratory manifestations: Secondary | ICD-10-CM

## 2016-06-14 DIAGNOSIS — R0981 Nasal congestion: Secondary | ICD-10-CM | POA: Insufficient documentation

## 2016-06-14 DIAGNOSIS — R062 Wheezing: Secondary | ICD-10-CM | POA: Insufficient documentation

## 2016-06-14 DIAGNOSIS — R109 Unspecified abdominal pain: Secondary | ICD-10-CM | POA: Insufficient documentation

## 2016-06-14 MED ORDER — OSELTAMIVIR PHOSPHATE 75 MG PO CAPS: 75.0000 mg | ORAL_CAPSULE | Freq: Two times a day (BID) | ORAL | 0 refills | Status: DC

## 2016-06-14 MED ORDER — PSEUDOEPHEDRINE HCL 60 MG PO TABS
60.0000 mg | ORAL_TABLET | Freq: Once | ORAL | Status: AC
  Administered 2016-06-14: 60 mg via ORAL
  Filled 2016-06-14: qty 1

## 2016-06-14 MED ORDER — ONDANSETRON HCL 4 MG PO TABS
4.0000 mg | ORAL_TABLET | Freq: Once | ORAL | Status: AC
  Administered 2016-06-14: 4 mg via ORAL
  Filled 2016-06-14: qty 1

## 2016-06-14 MED ORDER — OSELTAMIVIR PHOSPHATE 75 MG PO CAPS
75.0000 mg | ORAL_CAPSULE | Freq: Once | ORAL | Status: AC
  Administered 2016-06-14: 75 mg via ORAL
  Filled 2016-06-14: qty 1

## 2016-06-14 MED ORDER — IBUPROFEN 800 MG PO TABS
800.0000 mg | ORAL_TABLET | Freq: Once | ORAL | Status: AC
  Administered 2016-06-14: 800 mg via ORAL
  Filled 2016-06-14: qty 1

## 2016-06-14 MED ORDER — PROMETHAZINE-CODEINE 6.25-10 MG/5ML PO SYRP: 5.0000 mL | ORAL_SOLUTION | Freq: Four times a day (QID) | ORAL | 0 refills | Status: DC | PRN

## 2016-06-14 NOTE — ED Provider Notes (Signed)
[facility] DEPT Provider Note   CSN: [id number] Arrival date & time: 06/14/16  0854     History   Chief Complaint Chief Complaint  Patient presents with  . Generalized Body Aches    HPI Jessica Sanchez is a 31 y.o. female.   URI   This is a new problem. The current episode started 2 days ago. The problem has been gradually worsening. The maximum temperature recorded prior to her arrival was 100 to 100.9 F. Associated symptoms include abdominal pain, diarrhea, vomiting, congestion, headaches, sneezing, cough and wheezing. Pertinent negatives include no chest pain, no dysuria and no neck pain. Treatments tried: otc cold medication.    Past Medical History:  Diagnosis Date  . Fibroid (bleeding) (uterine)   . Fibroid, uterine   . H/O metrorrhagia   . IUD (intrauterine device) in place 07/11/2015  . Menorrhagia     Patient Active Problem List   Diagnosis Date Noted  . IUD (intrauterine device) in place 07/11/2015  . Other disorder of menstruation and other abnormal bleeding from female genital tract 10/30/2012    Past Surgical History:  Procedure Laterality Date  . HYSTEROSCOPY W/D&C N/A 09/27/2013   Procedure: DILATATION AND CURETTAGE /HYSTEROSCOPY and insertion of Mirena IUD ;  Surgeon: [name]. Harrington Challenger, MD;  Location: [facility];  Service: Gynecology;  Laterality: N/A;  . MYOMECTOMY  02/03/2011   Procedure: MYOMECTOMY;  Surgeon: [name], MD;  Location: [facility];  Service: Gynecology;  Laterality: N/A;  . TONSILLECTOMY      OB History    Gravida Para Term Preterm AB Living   1       1     SAB TAB Ectopic Multiple Live Births   1               Home Medications    Prior to Admission medications   Medication Sig Start Date End Date Taking? Authorizing Provider  ibuprofen (ADVIL,MOTRIN) 200 MG tablet Take 400 mg by mouth as needed for mild pain.   Yes Historical Provider, MD  Multiple Vitamins-Calcium (ONE-A-DAY WOMENS PO) Take 1 tablet by mouth daily.   Yes  Historical Provider, MD  OVER THE COUNTER MEDICATION Take 2 tablets by mouth every 4 (four) hours as needed (allergies).   Yes Historical Provider, MD  levonorgestrel (MIRENA) 20 MCG/24HR IUD 1 each by Intrauterine route once.    Historical Provider, MD  naproxen (NAPROSYN) 250 MG tablet Take 1 tablet (250 mg total) by mouth 2 (two) times daily with a meal. Patient not taking: Reported on 06/14/2016 05/24/16   Waynetta Pean, PA-C    Family History Family History  Problem Relation Age of Onset  . Cirrhosis Mother   . Diabetes Mother   . Cancer Maternal Grandfather   . Hypertension Sister   . Diabetes Sister   . Anesthesia problems Neg Hx   . Hypotension Neg Hx   . Malignant hyperthermia Neg Hx   . Pseudochol deficiency Neg Hx     Social History Social History  Substance Use Topics  . Smoking status: Never Smoker  . Smokeless tobacco: Never Used  . Alcohol use No     Allergies   Shellfish allergy   Review of Systems Review of Systems  Constitutional: Positive for appetite change and chills. Negative for activity change.       All ROS Neg except as noted in HPI  HENT: Positive for congestion and sneezing. Negative for nosebleeds.   Eyes: Negative for photophobia and  discharge.  Respiratory: Positive for cough and wheezing. Negative for shortness of breath.   Cardiovascular: Negative for chest pain and palpitations.  Gastrointestinal: Positive for abdominal pain, diarrhea and vomiting. Negative for blood in stool.  Genitourinary: Negative for dysuria, frequency and hematuria.  Musculoskeletal: Negative for arthralgias, back pain and neck pain.  Skin: Negative.   Neurological: Positive for headaches. Negative for dizziness, seizures and speech difficulty.  Psychiatric/Behavioral: Negative for confusion and hallucinations.     Physical Exam Updated Vital Signs BP 133/73 (BP Location: Left Arm)   Pulse 99   Temp 98.4 F (36.9 C) (Oral)   Resp 18   Ht 5\' 2"  (1.575 m)    Wt 124.7 kg   SpO2 100%   BMI 50.30 kg/m   Physical Exam  HENT:  Nasal congestion present. The airway is patent. Uvula is in the midline.  Pulmonary/Chest:  Course breath sounds with a few scattered rhonchi. Symmetrical rise and fall of the chest. Patient speaks in complete sentences without problem.  Musculoskeletal:  Capillary refill is less than 2 seconds. No pitting edema of the upper or lower extremities noted.     ED Treatments / Results  Labs (all labs ordered are listed, but only abnormal results are displayed) Labs Reviewed - No data to display  EKG  EKG Interpretation None       Radiology No results found.  Procedures Procedures (including critical care time)  Medications Ordered in ED Medications - No data to display   Initial Impression / Assessment and Plan / ED Course  I have reviewed the triage vital signs and the nursing notes.  Pertinent labs & imaging results that were available during my care of the patient were reviewed by me and considered in my medical decision making (see chart for details).     **I have reviewed nursing notes, vital signs, and all appropriate lab and imaging results for this patient.*  Final Clinical Impressions(s) / ED Diagnoses  MDM Vital signs reviewed. Pulse oximetry is 100% on room air. The examination favors influenza. The patient has symmetrical rise and fall of the chest, without tachypnea. There is no consolidation noted on pulmonary examination. I doubt pneumonia at this time. I have asked the patient to increase fluids, and wash hands frequently. Maintain good hydration. I have supplied the patient with a mask. And asked the patient to use Tylenol every 4 hours or ibuprofen every 6 hours. The symptoms started at least 3 days ago. Patient will not be placed on Tamiflu at this time. Questions were answered. Feel  it is safe for the patient be discharged home. The patient is in agreement with this plan.    Final  diagnoses:  Influenza-like illness    New Prescriptions Discharge Medication List as of 06/14/2016 10:26 AM       Lily Kocher, PA-C 06/16/16 1741    Elnora Morrison, MD 06/21/16 [phone]

## 2016-06-14 NOTE — ED Triage Notes (Signed)
Pt reports "I think I have the flu."  Generalized body aches, sore throat, chills, and tired x 2 days.

## 2016-06-14 NOTE — Discharge Instructions (Signed)
Your examination suggest influenza. Please increase water, juices, Kool-Aid, Gatorade's, etc. Please wash hands frequently. Usually mask until symptoms have resolved. Use Tamiflu 2 times daily until all taken. Use ibuprofen every 6 hours for fever or aching. Use the decongestant medication of your choice for nasal congestion and cough. Please see Dr. Lorriane Shire or return to the emergency department if not improving.

## 2016-06-15 ENCOUNTER — Emergency Department (HOSPITAL_COMMUNITY)
Admission: EM | Admit: 2016-06-15 | Discharge: 2016-06-15 | Disposition: A | Discharge status: Home / Self Care | Payer: Self-pay | Attending: Emergency Medicine | Admitting: Emergency Medicine

## 2016-06-15 ENCOUNTER — Encounter (HOSPITAL_COMMUNITY): Payer: Self-pay | Admitting: Emergency Medicine

## 2016-06-15 DIAGNOSIS — R509 Fever, unspecified: Secondary | ICD-10-CM | POA: Insufficient documentation

## 2016-06-15 DIAGNOSIS — R197 Diarrhea, unspecified: Secondary | ICD-10-CM | POA: Insufficient documentation

## 2016-06-15 DIAGNOSIS — M791 Myalgia: Secondary | ICD-10-CM | POA: Insufficient documentation

## 2016-06-15 DIAGNOSIS — R6889 Other general symptoms and signs: Secondary | ICD-10-CM

## 2016-06-15 DIAGNOSIS — R0981 Nasal congestion: Secondary | ICD-10-CM | POA: Insufficient documentation

## 2016-06-15 DIAGNOSIS — R05 Cough: Secondary | ICD-10-CM | POA: Insufficient documentation

## 2016-06-15 DIAGNOSIS — R112 Nausea with vomiting, unspecified: Secondary | ICD-10-CM | POA: Insufficient documentation

## 2016-06-15 MED ORDER — ONDANSETRON 4 MG PO TBDP: 4.0000 mg | ORAL_TABLET | Freq: Three times a day (TID) | ORAL | 0 refills | Status: AC | PRN

## 2016-06-15 MED ORDER — ONDANSETRON 4 MG PO TBDP
4.0000 mg | ORAL_TABLET | Freq: Once | ORAL | Status: AC
Start: 2016-06-15 — End: 2016-06-15
  Administered 2016-06-15: 4 mg via ORAL
  Filled 2016-06-15: qty 1

## 2016-06-15 NOTE — ED Triage Notes (Signed)
Pt was seen here yesterday and dx with the flu, symtpoms started 3 days ago, had weakness, bodyaches, fever/chills, cough.  Pt was seen at walk in clinic and was sent here by the clinic to receive IV fluids.  Pt states she is having cp at this time.  Has not had chest xray. Pt also reports having a "stiff neck". Pt is alert and oriented at this time.

## 2016-06-15 NOTE — ED Provider Notes (Signed)
[facility] DEPT Provider Note   CSN: [id number] Arrival date & time: 06/15/16  1349     History   Chief Complaint Chief Complaint  Patient presents with  . Follow-up    HPI Jessica Sanchez is a 31 y.o. female.  The history is provided by the patient.   31 year old female seen yesterday here in the emergency department and diagnosed with flu like illness with associated fevers, cough, nasal congestion, myalgias, headache, nausea, vomiting, diarrhea. Patient was prescribed Tamiflu. States that her symptoms have not improved in one to urgent care who sent her here for possible dehydration. She reports that she has been trying to stay hydrated but sometimes unable to tolerate fluids. Also reports left ear pain that has been ongoing for several days. Denies any other new complaints.   Past Medical History:  Diagnosis Date  . Fibroid (bleeding) (uterine)   . Fibroid, uterine   . H/O metrorrhagia   . IUD (intrauterine device) in place 07/11/2015  . Menorrhagia     Patient Active Problem List   Diagnosis Date Noted  . IUD (intrauterine device) in place 07/11/2015  . Other disorder of menstruation and other abnormal bleeding from female genital tract 10/30/2012    Past Surgical History:  Procedure Laterality Date  . HYSTEROSCOPY W/D&C N/A 09/27/2013   Procedure: DILATATION AND CURETTAGE /HYSTEROSCOPY and insertion of Mirena IUD ;  Surgeon: [name]. Harrington Challenger, MD;  Location: [facility];  Service: Gynecology;  Laterality: N/A;  . MYOMECTOMY  02/03/2011   Procedure: MYOMECTOMY;  Surgeon: [name], MD;  Location: [facility];  Service: Gynecology;  Laterality: N/A;  . TONSILLECTOMY      OB History    Gravida Para Term Preterm AB Living   1       1     SAB TAB Ectopic Multiple Live Births   1               Home Medications    Prior to Admission medications   Medication Sig Start Date End Date Taking? Authorizing Provider  ibuprofen (ADVIL,MOTRIN) 200 MG tablet Take 400 mg by  mouth as needed for mild pain.   Yes Historical Provider, MD  levonorgestrel (MIRENA) 20 MCG/24HR IUD 1 each by Intrauterine route once.   Yes Historical Provider, MD  Multiple Vitamins-Calcium (ONE-A-DAY WOMENS PO) Take 1 tablet by mouth daily.   Yes Historical Provider, MD  oseltamivir (TAMIFLU) 75 MG capsule Take 1 capsule (75 mg total) by mouth every 12 (twelve) hours. 06/14/16  Yes Lily Kocher, PA-C  promethazine-codeine (PHENERGAN WITH CODEINE) 6.25-10 MG/5ML syrup Take 5 mLs by mouth every 6 (six) hours as needed. 06/14/16  Yes Lily Kocher, PA-C  naproxen (NAPROSYN) 250 MG tablet Take 1 tablet (250 mg total) by mouth 2 (two) times daily with a meal. Patient not taking: Reported on 06/14/2016 05/24/16   Waynetta Pean, PA-C  ondansetron (ZOFRAN ODT) 4 MG disintegrating tablet Take 1 tablet (4 mg total) by mouth every 8 (eight) hours as needed for nausea or vomiting. 06/15/16 06/18/16  Fatima Blank, MD    Family History Family History  Problem Relation Age of Onset  . Cirrhosis Mother   . Diabetes Mother   . Cancer Maternal Grandfather   . Hypertension Sister   . Diabetes Sister   . Anesthesia problems Neg Hx   . Hypotension Neg Hx   . Malignant hyperthermia Neg Hx   . Pseudochol deficiency Neg Hx     Social History Social  History  Substance Use Topics  . Smoking status: Never Smoker  . Smokeless tobacco: Never Used  . Alcohol use No     Allergies   Shellfish allergy   Review of Systems Review of Systems  Ten systems are reviewed and are negative for acute change except as noted in the HPI  Physical Exam Updated Vital Signs BP (!) 115/48 (BP Location: Left Arm)   Pulse 82   Temp 99.9 F (37.7 C) (Oral)   Resp 16   Ht 5\' 2"  (1.575 m)   Wt 275 lb (124.7 kg)   SpO2 100%   BMI 50.30 kg/m   Physical Exam  Constitutional: She is oriented to person, place, and time. She appears well-developed and well-nourished. She appears ill. No distress.  HENT:    Head: Normocephalic and atraumatic.  Right Ear: Tympanic membrane normal.  Left Ear: Tympanic membrane normal.  Nose: Nose normal.  Mouth/Throat: Oropharynx is clear and moist. No tonsillar exudate.  Eyes: Conjunctivae and EOM are normal. Pupils are equal, round, and reactive to light. Right eye exhibits no discharge. Left eye exhibits no discharge. No scleral icterus.  Neck: Normal range of motion. Neck supple.  Cardiovascular: Normal rate and regular rhythm.  Exam reveals no gallop and no friction rub.   No murmur heard. Pulmonary/Chest: Effort normal and breath sounds normal. No stridor. No respiratory distress. She has no rales.  Abdominal: Soft. She exhibits no distension. There is no tenderness.  Musculoskeletal: She exhibits no edema or tenderness.  Neurological: She is alert and oriented to person, place, and time.  Skin: Skin is warm and dry. No rash noted. She is not diaphoretic. No erythema.  Psychiatric: She has a normal mood and affect.  Vitals reviewed.    ED Treatments / Results  Labs (all labs ordered are listed, but only abnormal results are displayed) Labs Reviewed - No data to display  EKG  EKG Interpretation None       Radiology No results found.  Procedures Procedures (including critical care time)  Medications Ordered in ED Medications  ondansetron (ZOFRAN-ODT) disintegrating tablet 4 mg (4 mg Oral Given 06/15/16 1853)     Initial Impression / Assessment and Plan / ED Course  I have reviewed the triage vital signs and the nursing notes.  Pertinent labs & imaging results that were available during my care of the patient were reviewed by me and considered in my medical decision making (see chart for details).     Presentation is consistent with a flulike illness. Patient is afebrile with stable vital signs, well-hydrated and nontoxic-appearing. No emesis in the emergency Department. Lungs clear auscultation thus doubt pneumonia. No indication for  IV fluids at this time. Will provide patient with Zofran and by mouth challenge.   7:48 PM  Able to tolerate PO intake.  The patient is safe for discharge with strict return precautions.   Final Clinical Impressions(s) / ED Diagnoses   Final diagnoses:  Flu-like symptoms   Disposition: Discharge  Condition: Good  I have discussed the results, Dx and Tx plan with the patient who expressed understanding and agree(s) with the plan. Discharge instructions discussed at great length. The patient was given strict return precautions who verbalized understanding of the instructions. No further questions at time of discharge.    New Prescriptions   ONDANSETRON (ZOFRAN ODT) 4 MG DISINTEGRATING TABLET    Take 1 tablet (4 mg total) by mouth every 8 (eight) hours as needed for nausea or vomiting.  Follow Up: Lucia Gaskins, MD [place] [address] [phone]  Schedule an appointment as soon as possible for a visit  in 5-7 days, If symptoms do not improve or  worsen      Fatima Blank, MD 06/15/16 1949

## 2016-06-16 NOTE — ED Notes (Addendum)
Pt reports was diagnosed with the flu and reported "i am still symptomatic,vomiting,chills, fever." pt reports I am supposed to go back to work tomorrow. Pt reports "can I get my work note extended". EDP consulted and note extended until 06/18/16. EDP reported to follow-up with pcp if still symptomatic after 06/18/16. Work note printed. Pt contacted and verbalized understanding.  Pt reports friend, Dorna Leitz, or sister, Declyn Jardon, is going to pick up work note at [employer].

## 2016-06-25 ENCOUNTER — Emergency Department (HOSPITAL_COMMUNITY)
Admission: EM | Admit: 2016-06-25 | Discharge: 2016-06-25 | Disposition: A | Discharge status: Home / Self Care | Payer: Self-pay | Attending: Emergency Medicine | Admitting: Emergency Medicine

## 2016-06-25 ENCOUNTER — Encounter (HOSPITAL_COMMUNITY): Payer: Self-pay | Admitting: Cardiology

## 2016-06-25 DIAGNOSIS — R05 Cough: Secondary | ICD-10-CM | POA: Insufficient documentation

## 2016-06-25 DIAGNOSIS — Z79899 Other long term (current) drug therapy: Secondary | ICD-10-CM | POA: Insufficient documentation

## 2016-06-25 DIAGNOSIS — J029 Acute pharyngitis, unspecified: Secondary | ICD-10-CM | POA: Insufficient documentation

## 2016-06-25 DIAGNOSIS — Z791 Long term (current) use of non-steroidal anti-inflammatories (NSAID): Secondary | ICD-10-CM | POA: Insufficient documentation

## 2016-06-25 DIAGNOSIS — R059 Cough, unspecified: Secondary | ICD-10-CM

## 2016-06-25 DIAGNOSIS — R42 Dizziness and giddiness: Secondary | ICD-10-CM | POA: Insufficient documentation

## 2016-06-25 MED ORDER — PREDNISONE 20 MG PO TABS: 40.0000 mg | ORAL_TABLET | Freq: Every day | ORAL | 0 refills | Status: DC

## 2016-06-25 MED ORDER — ALBUTEROL SULFATE HFA 108 (90 BASE) MCG/ACT IN AERS
1.0000 | INHALATION_SPRAY | RESPIRATORY_TRACT | Status: DC
  Administered 2016-06-25: 1 via RESPIRATORY_TRACT
  Filled 2016-06-25: qty 6.7

## 2016-06-25 MED ORDER — PREDNISONE 50 MG PO TABS
60.0000 mg | ORAL_TABLET | ORAL | Status: AC
  Administered 2016-06-25: 60 mg via ORAL
  Filled 2016-06-25: qty 1

## 2016-06-25 NOTE — ED Provider Notes (Signed)
[facility] DEPT Provider Note   CSN: [id number] Arrival date & time: 06/25/16  [place]     History   Chief Complaint Chief Complaint  Patient presents with  . Cough    HPI Jessica Sanchez is a 31 y.o. female.  HPI  Patient presents with ongoing cough, generalized discomfort, lightheadedness. Patient notes that after episode of influenza earlier this month she was transiently better, but now over the past 4 days has had worsening cough, generalized discomfort. No new objective fever, no vomiting, no diarrhea, no myalgia. No relief with previously prescribed cough medication. Patient denies other life changes including job, diet, activity.   Past Medical History:  Diagnosis Date  . Fibroid (bleeding) (uterine)   . Fibroid, uterine   . H/O metrorrhagia   . IUD (intrauterine device) in place 07/11/2015  . Menorrhagia     Patient Active Problem List   Diagnosis Date Noted  . IUD (intrauterine device) in place 07/11/2015  . Other disorder of menstruation and other abnormal bleeding from female genital tract 10/30/2012    Past Surgical History:  Procedure Laterality Date  . HYSTEROSCOPY W/D&C N/A 09/27/2013   Procedure: DILATATION AND CURETTAGE /HYSTEROSCOPY and insertion of Mirena IUD ;  Surgeon: [name]. Harrington Challenger, MD;  Location: [facility];  Service: Gynecology;  Laterality: N/A;  . MYOMECTOMY  02/03/2011   Procedure: MYOMECTOMY;  Surgeon: [name], MD;  Location: [facility];  Service: Gynecology;  Laterality: N/A;  . TONSILLECTOMY      OB History    Gravida Para Term Preterm AB Living   1       1     SAB TAB Ectopic Multiple Live Births   1               Home Medications    Prior to Admission medications   Medication Sig Start Date End Date Taking? Authorizing Provider  ibuprofen (ADVIL,MOTRIN) 200 MG tablet Take 400 mg by mouth as needed for mild pain.    Historical Provider, MD  levonorgestrel (MIRENA) 20 MCG/24HR IUD 1 each by Intrauterine route once.     Historical Provider, MD  Multiple Vitamins-Calcium (ONE-A-DAY WOMENS PO) Take 1 tablet by mouth daily.    Historical Provider, MD  naproxen (NAPROSYN) 250 MG tablet Take 1 tablet (250 mg total) by mouth 2 (two) times daily with a meal. Patient not taking: Reported on 06/14/2016 05/24/16   Waynetta Pean, PA-C  oseltamivir (TAMIFLU) 75 MG capsule Take 1 capsule (75 mg total) by mouth every 12 (twelve) hours. 06/14/16   Lily Kocher, PA-C  predniSONE (DELTASONE) 20 MG tablet Take 2 tablets (40 mg total) by mouth daily with breakfast. For the next four days 06/25/16   Carmin Muskrat, MD  promethazine-codeine [facility] WITH CODEINE) 6.25-10 MG/5ML syrup Take 5 mLs by mouth every 6 (six) hours as needed. 06/14/16   Lily Kocher, PA-C    Family History Family History  Problem Relation Age of Onset  . Cirrhosis Mother   . Diabetes Mother   . Cancer Maternal Grandfather   . Hypertension Sister   . Diabetes Sister   . Anesthesia problems Neg Hx   . Hypotension Neg Hx   . Malignant hyperthermia Neg Hx   . Pseudochol deficiency Neg Hx     Social History Social History  Substance Use Topics  . Smoking status: Never Smoker  . Smokeless tobacco: Never Used  . Alcohol use No     Allergies   Shellfish allergy  Review of Systems Review of Systems  Constitutional:       Per HPI, otherwise negative  HENT:       Per HPI, otherwise negative  Respiratory:       Per HPI, otherwise negative  Cardiovascular:       Per HPI, otherwise negative  Gastrointestinal: Negative for vomiting.  Endocrine:       Negative aside from HPI  Genitourinary:       Neg aside from HPI   Musculoskeletal:       Per HPI, otherwise negative  Skin: Negative.   Allergic/Immunologic: Negative for immunocompromised state.       Childhood asthma  Neurological: Negative for syncope.     Physical Exam Updated Vital Signs BP 111/91 (BP Location: Right Arm)   Pulse 96   Temp 97.9 F (36.6 C) (Oral)   Resp 18    Ht 5\' 2"  (1.575 m)   Wt 275 lb (124.7 kg)   SpO2 96%   BMI 50.30 kg/m   Physical Exam  Constitutional: She is oriented to person, place, and time. She appears well-developed and well-nourished. No distress.  Obese young female sitting upright in bed speaking clearly  HENT:  Head: Normocephalic and atraumatic.  Eyes: Conjunctivae and EOM are normal.  Cardiovascular: Normal rate and regular rhythm.   Pulmonary/Chest: No stridor. She has decreased breath sounds.  Abdominal: She exhibits no distension.  Musculoskeletal: She exhibits no edema.  Neurological: She is alert and oriented to person, place, and time. No cranial nerve deficit.  Skin: Skin is warm and dry.  Psychiatric: She has a normal mood and affect.  Nursing note and vitals reviewed.    ED Treatments / Results   Procedures Procedures (including critical care time)  Medications Ordered in ED Medications  predniSONE (DELTASONE) tablet 60 mg (not administered)  albuterol (PROVENTIL HFA;VENTOLIN HFA) 108 (90 Base) MCG/ACT inhaler 1 puff (not administered)     Initial Impression / Assessment and Plan / ED Course  I have reviewed the triage vital signs and the nursing notes.  Pertinent labs & imaging results that were available during my care of the patient were reviewed by me and considered in my medical decision making (see chart for details).   Chart review notable for recent diagnosis of influenza, 8 prior emergency department visits in 6 months.  Young female presents with ongoing cough. She is awake, alert, with no hypotension, fever, tachycardia suggesting bacteremia, sepsis, pneumonia. Patient does have history of reactive airway disease, and there is some suspicion for bronchitis given the patient's recent pulmonary infection. Patient started on a short course of steroids, provided albuterol, will follow-up with primary care.  Final Clinical Impressions(s) / ED Diagnoses   Final diagnoses:  Cough     New Prescriptions New Prescriptions   PREDNISONE (DELTASONE) 20 MG TABLET    Take 2 tablets (40 mg total) by mouth daily with breakfast. For the next four days     Carmin Muskrat, MD 06/25/16 [phone]

## 2016-06-25 NOTE — ED Triage Notes (Signed)
Non-productive cough,  Sore throat since Monday.

## 2016-06-25 NOTE — Discharge Instructions (Signed)
As discussed, your evaluation today has been largely reassuring.  But, it is important that you monitor your condition carefully, and do not hesitate to return to the ED if you develop new, or concerning changes in your condition.  For the next 4 days please use the prescribed prednisone  For the next 2 days please use the provided albuterol inhaler every 4 hours.  Otherwise, please follow-up with your physician for appropriate ongoing care.

## 2016-07-11 ENCOUNTER — Emergency Department (HOSPITAL_COMMUNITY): Payer: Self-pay

## 2016-07-11 ENCOUNTER — Emergency Department (HOSPITAL_COMMUNITY)
Admission: EM | Admit: 2016-07-11 | Discharge: 2016-07-11 | Disposition: A | Discharge status: Home / Self Care | Payer: Self-pay | Attending: Emergency Medicine | Admitting: Emergency Medicine

## 2016-07-11 ENCOUNTER — Encounter (HOSPITAL_COMMUNITY): Payer: Self-pay | Admitting: Emergency Medicine

## 2016-07-11 DIAGNOSIS — J40 Bronchitis, not specified as acute or chronic: Secondary | ICD-10-CM | POA: Insufficient documentation

## 2016-07-11 MED ORDER — ALBUTEROL SULFATE (2.5 MG/3ML) 0.083% IN NEBU
2.5000 mg | INHALATION_SOLUTION | Freq: Once | RESPIRATORY_TRACT | Status: AC
  Administered 2016-07-11: 2.5 mg via RESPIRATORY_TRACT
  Filled 2016-07-11: qty 3

## 2016-07-11 MED ORDER — AZITHROMYCIN 250 MG PO TABS: ORAL_TABLET | ORAL | 0 refills | Status: DC

## 2016-07-11 MED ORDER — DIPHENHYDRAMINE HCL 25 MG PO CAPS
25.0000 mg | ORAL_CAPSULE | Freq: Once | ORAL | Status: AC
  Administered 2016-07-11: 25 mg via ORAL
  Filled 2016-07-11: qty 1

## 2016-07-11 MED ORDER — IPRATROPIUM-ALBUTEROL 0.5-2.5 (3) MG/3ML IN SOLN
3.0000 mL | Freq: Once | RESPIRATORY_TRACT | Status: AC
  Administered 2016-07-11: 3 mL via RESPIRATORY_TRACT
  Filled 2016-07-11 (×2): qty 3

## 2016-07-11 MED ORDER — KETOROLAC TROMETHAMINE 60 MG/2ML IM SOLN
60.0000 mg | Freq: Once | INTRAMUSCULAR | Status: AC
  Administered 2016-07-11: 60 mg via INTRAMUSCULAR
  Filled 2016-07-11: qty 2

## 2016-07-11 MED ORDER — BENZONATATE 200 MG PO CAPS: 200.0000 mg | ORAL_CAPSULE | Freq: Three times a day (TID) | ORAL | 0 refills | Status: DC | PRN

## 2016-07-11 MED ORDER — ALBUTEROL SULFATE HFA 108 (90 BASE) MCG/ACT IN AERS
2.0000 | INHALATION_SPRAY | Freq: Once | RESPIRATORY_TRACT | Status: AC
  Administered 2016-07-11: 2 via RESPIRATORY_TRACT
  Filled 2016-07-11: qty 6.7

## 2016-07-11 NOTE — ED Triage Notes (Deleted)
Pt c/o increased SOB and Chest tightness x 2 days.

## 2016-07-11 NOTE — ED Triage Notes (Addendum)
Patient c/o cough with sore throat, nasal and chest congestion, fever, fatigue, and shortness of breath. Patient states has had bloody sputum x2. Patient reports fever of 103 today in which she took tylenol an hour and half prior to arriving her in ER. Airway patent. No acute distress noted.

## 2016-07-11 NOTE — ED Provider Notes (Signed)
[facility] DEPT Provider Note   CSN: [id number] Arrival date & time: 07/11/16  1647  By signing my name below, I, [name], attest that this documentation has been prepared under the direction and in the presence of Ashby Moskal PA-C.  Electronically Signed: [name], Scribe. 07/11/16. 5:55 PM.  History   Chief Complaint Chief Complaint  Patient presents with  . Shortness of Breath    HPI Comments: Jessica Sanchez is a 31 y.o. female with no pertinent medical history, who presents to the Emergency Department complaining of sudden-onset shortness of breath that began three days ago. Patient reports associated cough with intermittent blood tinged sputum, sore throat, chest tightness, chest congestion, myalgias, bilateral ear pain, and a fever (103 max). Patient reports taking tylenol one hour prior to arrival. Patient reports sick contacts at work. Patient denies any nausea, vomiting, diarrhea, or abdominal pain, dysuria. Nothing makes her symptoms better or worse  The history is provided by the patient. No language interpreter was used.    Past Medical History:  Diagnosis Date  . Fibroid (bleeding) (uterine)   . Fibroid, uterine   . H/O metrorrhagia   . IUD (intrauterine device) in place 07/11/2015  . Menorrhagia     Patient Active Problem List   Diagnosis Date Noted  . IUD (intrauterine device) in place 07/11/2015  . Other disorder of menstruation and other abnormal bleeding from female genital tract 10/30/2012    Past Surgical History:  Procedure Laterality Date  . HYSTEROSCOPY W/D&C N/A 09/27/2013   Procedure: DILATATION AND CURETTAGE /HYSTEROSCOPY and insertion of Mirena IUD ;  Surgeon: [name]. Harrington Challenger, MD;  Location: [facility];  Service: Gynecology;  Laterality: N/A;  . MYOMECTOMY  02/03/2011   Procedure: MYOMECTOMY;  Surgeon: [name], MD;  Location: [facility];  Service: Gynecology;  Laterality: N/A;  . TONSILLECTOMY      OB History    Gravida Para Term  Preterm AB Living   1       1     SAB TAB Ectopic Multiple Live Births   1               Home Medications    Prior to Admission medications   Medication Sig Start Date End Date Taking? Authorizing Provider  ibuprofen (ADVIL,MOTRIN) 200 MG tablet Take 400 mg by mouth as needed for mild pain.    Historical Provider, MD  levonorgestrel (MIRENA) 20 MCG/24HR IUD 1 each by Intrauterine route once.    Historical Provider, MD  Multiple Vitamins-Calcium (ONE-A-DAY WOMENS PO) Take 1 tablet by mouth daily.    Historical Provider, MD  naproxen (NAPROSYN) 250 MG tablet Take 1 tablet (250 mg total) by mouth 2 (two) times daily with a meal. Patient not taking: Reported on 06/14/2016 05/24/16   Waynetta Pean, PA-C  oseltamivir (TAMIFLU) 75 MG capsule Take 1 capsule (75 mg total) by mouth every 12 (twelve) hours. 06/14/16   Lily Kocher, PA-C  predniSONE (DELTASONE) 20 MG tablet Take 2 tablets (40 mg total) by mouth daily with breakfast. For the next four days 06/25/16   Carmin Muskrat, MD  promethazine-codeine [facility] WITH CODEINE) 6.25-10 MG/5ML syrup Take 5 mLs by mouth every 6 (six) hours as needed. 06/14/16   Lily Kocher, PA-C    Family History Family History  Problem Relation Age of Onset  . Cirrhosis Mother   . Diabetes Mother   . Cancer Maternal Grandfather   . Hypertension Sister   . Diabetes Sister   .  Anesthesia problems Neg Hx   . Hypotension Neg Hx   . Malignant hyperthermia Neg Hx   . Pseudochol deficiency Neg Hx     Social History Social History  Substance Use Topics  . Smoking status: Never Smoker  . Smokeless tobacco: Never Used  . Alcohol use No     Allergies   Shellfish allergy   Review of Systems Review of Systems  Constitutional: Positive for fever.  HENT: Positive for congestion, ear pain and sore throat. Negative for sinus pressure and trouble swallowing.   Respiratory: Positive for cough, chest tightness and shortness of breath.   Cardiovascular:  Negative for chest pain.  Gastrointestinal: Negative for abdominal pain, diarrhea, nausea and vomiting.  Genitourinary: Negative for dysuria and frequency.  Musculoskeletal: Positive for myalgias.  Skin: Negative for rash.  Neurological: Negative for dizziness, weakness and headaches.  Psychiatric/Behavioral: Negative for decreased concentration.     Physical Exam Updated Vital Signs BP 134/86   Pulse 94   Temp 98.8 F (37.1 C)   Resp 18   Ht 5\' 2"  (1.575 m)   Wt 275 lb (124.7 kg)   SpO2 100%   BMI 50.30 kg/m   Physical Exam  Constitutional: She is oriented to person, place, and time. She appears well-developed. No distress.  HENT:  Head: Normocephalic and atraumatic.  Mouth/Throat: Oropharynx is clear and moist.  Oropharynx was erythematous without edema or exudate.   Eyes: Conjunctivae and EOM are normal. Pupils are equal, round, and reactive to light.  Neck: Normal range of motion. Neck supple.  Cardiovascular: Normal rate, regular rhythm, normal heart sounds and intact distal pulses.   Pulmonary/Chest: Effort normal. She has wheezes.  Course lung sounds bilaterally with few expiratory wheezes present.   Abdominal: Soft. She exhibits no distension. There is no tenderness.  Musculoskeletal: Normal range of motion.  Lymphadenopathy:    She has no cervical adenopathy.  Neurological: She is alert and oriented to person, place, and time.  Skin: Skin is warm and dry. No rash noted.  Psychiatric: She has a normal mood and affect.  Nursing note and vitals reviewed.    ED Treatments / Results  DIAGNOSTIC STUDIES: Oxygen Saturation is 100% on RA, normal by my interpretation.    COORDINATION OF CARE: 7:08 PM Discussed treatment plan with pt at bedside and pt agreed to plan, which includes an inhaler, breathing treatment, antibiotics, and steroids.   Labs (all labs ordered are listed, but only abnormal results are displayed) Labs Reviewed - No data to display  EKG  EKG  Interpretation None       Radiology Dg Chest 2 View  Result Date: 07/11/2016 CLINICAL DATA:  Shortness of breath, cough EXAM: CHEST  2 VIEW COMPARISON:  05/22/2016 FINDINGS: The heart size and mediastinal contours are within normal limits. Both lungs are clear. The visualized skeletal structures are unremarkable. IMPRESSION: No active cardiopulmonary disease. Electronically Signed   By: Kathreen Devoid   On: 07/11/2016 17:20    Procedures Procedures (including critical care time)  Medications Ordered in ED Medications  ipratropium-albuterol (DUONEB) 0.5-2.5 (3) MG/3ML nebulizer solution 3 mL (3 mLs Nebulization Given 07/11/16 1825)  albuterol (PROVENTIL) (2.5 MG/3ML) 0.083% nebulizer solution 2.5 mg (2.5 mg Nebulization Given 07/11/16 1825)  albuterol (PROVENTIL HFA;VENTOLIN HFA) 108 (90 Base) MCG/ACT inhaler 2 puff (2 puffs Inhalation Given 07/11/16 1831)  ketorolac (TORADOL) injection 60 mg (60 mg Intramuscular Given 07/11/16 1940)  diphenhydrAMINE (BENADRYL) capsule 25 mg (25 mg Oral Given 07/11/16 1940)  Initial Impression / Assessment and Plan / ED Course  I have reviewed the triage vital signs and the nursing notes.  Pertinent labs & imaging results that were available during my care of the patient were reviewed by me and considered in my medical decision making (see chart for details).    Re-evaluated after nebulizer treatment, lung sounds have improved. Vitals stable.  No concerning sx's for PE, likely bronchitis.  Pt appears stable for d/c.  Albuterol MDI dispensed, Rx for  azithromycin and tessalon.  Return precautions discussed.   Final Clinical Impressions(s) / ED Diagnoses   Final diagnoses:  Bronchitis    New Prescriptions Discharge Medication List as of 07/11/2016  8:06 PM    START taking these medications   Details  azithromycin (ZITHROMAX) 250 MG tablet Take first 2 tablets together, then 1 every day until finished., Print    benzonatate (TESSALON) 200 MG  capsule Take 1 capsule (200 mg total) by mouth 3 (three) times daily as needed for cough. Swallow whole, do not chew, Starting Sun 07/11/2016, Print       I personally performed the services described in this documentation, which was scribed in my presence. The recorded information has been reviewed and is accurate.     Kem Parkinson, PA-C 07/14/16 [place], DO 07/15/16 1520

## 2016-07-11 NOTE — Discharge Instructions (Signed)
Follow-up with your primary doctor for recheck.  2 puffs of the inhaler 4 times a day as needed.

## 2016-07-19 ENCOUNTER — Emergency Department (HOSPITAL_COMMUNITY): Payer: Self-pay

## 2016-07-19 ENCOUNTER — Emergency Department (HOSPITAL_COMMUNITY)
Admission: EM | Admit: 2016-07-19 | Discharge: 2016-07-19 | Disposition: A | Discharge status: Home / Self Care | Payer: Self-pay | Attending: Emergency Medicine | Admitting: Emergency Medicine

## 2016-07-19 ENCOUNTER — Encounter (HOSPITAL_COMMUNITY): Payer: Self-pay | Admitting: Emergency Medicine

## 2016-07-19 DIAGNOSIS — Z79899 Other long term (current) drug therapy: Secondary | ICD-10-CM | POA: Insufficient documentation

## 2016-07-19 DIAGNOSIS — R1030 Lower abdominal pain, unspecified: Secondary | ICD-10-CM | POA: Insufficient documentation

## 2016-07-19 DIAGNOSIS — J208 Acute bronchitis due to other specified organisms: Secondary | ICD-10-CM | POA: Insufficient documentation

## 2016-07-19 LAB — URINALYSIS, ROUTINE W REFLEX MICROSCOPIC
Bilirubin Urine: NEGATIVE
GLUCOSE, UA: NEGATIVE mg/dL
KETONES UR: NEGATIVE mg/dL
LEUKOCYTES UA: NEGATIVE
Nitrite: NEGATIVE
PH: 5 (ref 5.0–8.0)
Protein, ur: NEGATIVE mg/dL
Specific Gravity, Urine: 1.024 (ref 1.005–1.030)

## 2016-07-19 LAB — COMPREHENSIVE METABOLIC PANEL
ALK PHOS: 58 U/L (ref 38–126)
ALT: 14 U/L (ref 14–54)
AST: 16 U/L (ref 15–41)
Albumin: 3.7 g/dL (ref 3.5–5.0)
Anion gap: 6 (ref 5–15)
BILIRUBIN TOTAL: 0.2 mg/dL — AB (ref 0.3–1.2)
BUN: 12 mg/dL (ref 6–20)
CHLORIDE: 102 mmol/L (ref 101–111)
CO2: 25 mmol/L (ref 22–32)
CREATININE: 0.78 mg/dL (ref 0.44–1.00)
Calcium: 8.9 mg/dL (ref 8.9–10.3)
GFR calc Af Amer: >60 mL/min (ref >60)
Glucose, Bld: 116 mg/dL — ABNORMAL HIGH (ref 65–99)
Potassium: 3.5 mmol/L (ref 3.5–5.1)
Sodium: 133 mmol/L — ABNORMAL LOW (ref 135–145)
TOTAL PROTEIN: 7 g/dL (ref 6.5–8.1)

## 2016-07-19 LAB — CBC
HCT: 38.5 % (ref 36.0–46.0)
Hemoglobin: 12.8 g/dL (ref 12.0–15.0)
MCH: 28.3 pg (ref 26.0–34.0)
MCHC: 33.2 g/dL (ref 30.0–36.0)
MCV: 85 fL (ref 78.0–100.0)
Platelets: 358 10*3/uL (ref 150–400)
RBC: 4.53 MIL/uL (ref 3.87–5.11)
RDW: 13.9 % (ref 11.5–15.5)
WBC: 7.4 10*3/uL (ref 4.0–10.5)

## 2016-07-19 LAB — PREGNANCY, URINE: Preg Test, Ur: NEGATIVE

## 2016-07-19 MED ORDER — BENZONATATE 200 MG PO CAPS: 200.0000 mg | ORAL_CAPSULE | Freq: Three times a day (TID) | ORAL | 0 refills | Status: DC | PRN

## 2016-07-19 MED ORDER — NAPROXEN 500 MG PO TABS: 500.0000 mg | ORAL_TABLET | Freq: Two times a day (BID) | ORAL | 0 refills | Status: DC

## 2016-07-19 NOTE — ED Provider Notes (Signed)
[facility] DEPT Provider Note   CSN: [id number] Arrival date & time: 07/19/16  1351  By signing my name below, I, [name], attest that this documentation has been prepared under the direction and in the presence of [name], PA-C. Electronically Signed: [name], Scribe. 07/19/16. 7:18 PM.  History   Chief Complaint Chief Complaint  Patient presents with  . Abdominal Pain   HPI Comments: Jessica Sanchez is a 31 y.o. female well known to this department, who presents with generalized body aches, wheezing, productive cough with yellow sputum, nasal congestion, weakness, shortness of breath, and chest wall pain secondary to cough. She was seen by her primary physician today, in which she was prescribed a pro-air inhaler and prednisone, but the Pro Air was too expensive for her, she did not get the prednisone filled either. Of note, she as given an albuterol mdi at her last visit here last week, which she still has available. Patient reports taking multiple over the counter medications with no relief. Patient states when she is sleeping she has to "catch" her breath, which wakes her up in the middle of the night. Patient denies any nausea, vomiting, sore throat, fever, or urinary symptoms. She is most concerned about possibly having pneumonia.  She also mentions a long standing history of loud snoring followed by frequent startling herself awake.  Additionally she endorses chronic low midline abdominal pain which has been present for the past few weeks and reminds her of pain she experienced with prior uterine fibroids.  She does endorse occasional vaginal bleeding, although does not have regular periods with her IUD in place. She has found no alleviators but has taken tylenol and motrin without relief.     The history is provided by the patient. No language interpreter was used.    Past Medical History:  Diagnosis Date  . Fibroid (bleeding) (uterine)   . Fibroid, uterine   .  H/O metrorrhagia   . IUD (intrauterine device) in place 07/11/2015  . Menorrhagia     Patient Active Problem List   Diagnosis Date Noted  . IUD (intrauterine device) in place 07/11/2015  . Other disorder of menstruation and other abnormal bleeding from female genital tract 10/30/2012    Past Surgical History:  Procedure Laterality Date  . HYSTEROSCOPY W/D&C N/A 09/27/2013   Procedure: DILATATION AND CURETTAGE /HYSTEROSCOPY and insertion of Mirena IUD ;  Surgeon: [name]. Harrington Challenger, MD;  Location: [facility];  Service: Gynecology;  Laterality: N/A;  . MYOMECTOMY  02/03/2011   Procedure: MYOMECTOMY;  Surgeon: [name], MD;  Location: [facility];  Service: Gynecology;  Laterality: N/A;  . TONSILLECTOMY      OB History    Gravida Para Term Preterm AB Living   1       1     SAB TAB Ectopic Multiple Live Births   1               Home Medications    Prior to Admission medications   Medication Sig Start Date End Date Taking? Authorizing Provider  azithromycin (ZITHROMAX) 250 MG tablet Take first 2 tablets together, then 1 every day until finished. 07/11/16   Tammy Triplett, PA-C  benzonatate (TESSALON) 200 MG capsule Take 1 capsule (200 mg total) by mouth 3 (three) times daily as needed. 07/19/16   [name], PA-C  levonorgestrel (MIRENA) 20 MCG/24HR IUD 1 each by Intrauterine route once.    Historical Provider, MD  Multiple Vitamins-Calcium (ONE-A-DAY WOMENS PO)  Take 1 tablet by mouth daily.    Historical Provider, MD  naproxen (NAPROSYN) 500 MG tablet Take 1 tablet (500 mg total) by mouth 2 (two) times daily. 07/19/16   [name], PA-C  oseltamivir (TAMIFLU) 75 MG capsule Take 1 capsule (75 mg total) by mouth every 12 (twelve) hours. 06/14/16   Lily Kocher, PA-C  predniSONE (DELTASONE) 20 MG tablet Take 2 tablets (40 mg total) by mouth daily with breakfast. For the next four days 06/25/16   Carmin Muskrat, MD  promethazine-codeine [facility] WITH CODEINE) 6.25-10 MG/5ML syrup Take 5 mLs by  mouth every 6 (six) hours as needed. 06/14/16   Lily Kocher, PA-C    Family History Family History  Problem Relation Age of Onset  . Cirrhosis Mother   . Diabetes Mother   . Cancer Maternal Grandfather   . Hypertension Sister   . Diabetes Sister   . Anesthesia problems Neg Hx   . Hypotension Neg Hx   . Malignant hyperthermia Neg Hx   . Pseudochol deficiency Neg Hx     Social History Social History  Substance Use Topics  . Smoking status: Never Smoker  . Smokeless tobacco: Never Used  . Alcohol use No     Allergies   Shellfish allergy   Review of Systems Review of Systems  Constitutional: Negative for fever.  HENT: Positive for ear pain (left).   Respiratory: Positive for cough, shortness of breath and wheezing.   Cardiovascular: Positive for chest pain.  Gastrointestinal: Positive for abdominal pain. Negative for diarrhea, nausea and vomiting.  Genitourinary: Negative for difficulty urinating.  Musculoskeletal: Positive for myalgias.  Neurological: Positive for weakness.     Physical Exam Updated Vital Signs BP 121/69 (BP Location: Right Arm)   Pulse 81   Temp 98.2 F (36.8 C) (Oral)   Resp 20   Ht 5\' 2"  (1.575 m)   Wt 124.7 kg   SpO2 100%   BMI 50.30 kg/m   Physical Exam  Constitutional: She is oriented to person, place, and time. She appears well-developed.  HENT:  Head: Normocephalic and atraumatic.  Right Ear: Tympanic membrane and ear canal normal.  Left Ear: Tympanic membrane and ear canal normal.  Mouth/Throat: Oropharynx is clear and moist.  Oropharynx clear. No erythema or edema. Nasal congestion present.   Eyes: Conjunctivae and EOM are normal. Pupils are equal, round, and reactive to light.  Neck: Normal range of motion. Neck supple.  Cardiovascular: Normal rate and regular rhythm.   Pulmonary/Chest: Effort normal. She has wheezes. She has no rales. She exhibits no tenderness.  Sparse expiratory wheeze, which cleared with cough. Cough  sounds wet. Course breath sounds without rhonchi or rales.   Musculoskeletal: Normal range of motion.  Neurological: She is alert and oriented to person, place, and time.  Skin: Skin is warm and dry.  Psychiatric: She has a normal mood and affect.     ED Treatments / Results  DIAGNOSTIC STUDIES: Oxygen Saturation is 99% on RA, normal by my interpretation.    COORDINATION OF CARE: 7:18 PM Discussed treatment plan with pt at bedside and pt agreed to plan, which includes Tessalon Perles and a chest x-ray.   Labs (all labs ordered are listed, but only abnormal results are displayed) Labs Reviewed  COMPREHENSIVE METABOLIC PANEL - Abnormal; Notable for the following:       Result Value   Sodium 133 (*)    Glucose, Bld 116 (*)    Total Bilirubin 0.2 (*)  All other components within normal limits  URINALYSIS, ROUTINE W REFLEX MICROSCOPIC - Abnormal; Notable for the following:    APPearance HAZY (*)    Hgb urine dipstick MODERATE (*)    Bacteria, UA RARE (*)    All other components within normal limits  CBC  PREGNANCY, URINE    EKG  EKG Interpretation None       Radiology Dg Chest 2 View  Result Date: 07/19/2016 CLINICAL DATA:  Subacute onset of nonproductive cough, shortness of breath and generalized chest pain. Generalized weakness. Initial encounter. EXAM: CHEST  2 VIEW COMPARISON:  Chest radiograph performed 07/11/2016 FINDINGS: The lungs are well-aerated. Mild vascular congestion is noted. There is no evidence of focal opacification, pleural effusion or pneumothorax. The heart is borderline enlarged. No acute osseous abnormalities are seen. IMPRESSION: Mild vascular congestion and borderline cardiomegaly. Lungs remain grossly clear. Electronically Signed   By: Garald Balding M.D.   On: 07/19/2016 19:53    Procedures Procedures (including critical care time)  Medications Ordered in ED Medications - No data to display   Initial Impression / Assessment and Plan / ED  Course  I have reviewed the triage vital signs and the nursing notes.  Pertinent labs & imaging results that were available during my care of the patient were reviewed by me and considered in my medical decision making (see chart for details).     Pt with persistent cough without changes on cxr today, describes possible chronic sleep apnea.  Suggested f/u with pcp to discuss and arrange sleep study if pcp deems appropriate.  She did not explore this sx with pcp at todays visit.  She was encouraged to continue using the albuterol she was given at her last visit here, start prednisone she was prescribed by her pcp. Tessalon added.  Abdominal pain is chronic with no exam findings to suggest an acute abdominal process. Labs reviewed and unremarkable. Suggested f/u with her gyn since she feels her fibroids may be the source of sx. Pt agrees and understands todays plan.  Final Clinical Impressions(s) / ED Diagnoses   Final diagnoses:  Lower abdominal pain  Acute bronchitis due to other specified organisms    New Prescriptions Discharge Medication List as of 07/19/2016  9:42 PM     I personally performed the services described in this documentation, which was scribed in my presence. The recorded information has been reviewed and is accurate.    [name], PA-C 07/20/16 [place], MD 07/20/16 1550

## 2016-07-19 NOTE — Discharge Instructions (Signed)
As discussed,  start taking the prednisone that was prescribed by your doctor today.  Additionally, use your inhaler every 4 hours if needed.  Take the tessalon also for coughing.  You may benefit from a visit to your gynecologist if you suspect your fibroids may be causing discomfort.

## 2016-07-19 NOTE — ED Triage Notes (Signed)
Pt reports abd pain, generalized body aches, cough for last several weeks. Pt reports increased fatigue.

## 2016-07-19 NOTE — ED Notes (Signed)
Pt alert & oriented x4, stable gait. Patient given discharge instructions, paperwork & prescription(s). Patient  instructed to stop at the registration desk to finish any additional paperwork. Patient verbalized understanding. Pt left department w/ no further questions. 

## 2016-07-26 ENCOUNTER — Emergency Department (HOSPITAL_COMMUNITY): Payer: Self-pay

## 2016-07-26 ENCOUNTER — Encounter (HOSPITAL_COMMUNITY): Payer: Self-pay | Admitting: Emergency Medicine

## 2016-07-26 ENCOUNTER — Emergency Department (HOSPITAL_COMMUNITY)
Admission: EM | Admit: 2016-07-26 | Discharge: 2016-07-26 | Disposition: A | Discharge status: Home / Self Care | Payer: Self-pay | Attending: Emergency Medicine | Admitting: Emergency Medicine

## 2016-07-26 DIAGNOSIS — M79644 Pain in right finger(s): Secondary | ICD-10-CM

## 2016-07-26 DIAGNOSIS — R609 Edema, unspecified: Secondary | ICD-10-CM | POA: Insufficient documentation

## 2016-07-26 DIAGNOSIS — Z79899 Other long term (current) drug therapy: Secondary | ICD-10-CM | POA: Insufficient documentation

## 2016-07-26 MED ORDER — CEPHALEXIN 500 MG PO CAPS: 500.0000 mg | ORAL_CAPSULE | Freq: Four times a day (QID) | ORAL | 0 refills | Status: DC

## 2016-07-26 NOTE — Discharge Instructions (Signed)
Warm water soaks 2-3 times a day to the affected finger.  Return here in 2 days for recheck if not improving

## 2016-07-26 NOTE — ED Triage Notes (Signed)
PT states pain to right hand middle finger around the nail bed with no new injury x1 day.

## 2016-07-26 NOTE — ED Provider Notes (Signed)
[name] DEPT Provider Note   CSN: [id number] Arrival date & time: 07/26/16  1758  By signing my name below, I, [name], attest that this documentation has been prepared under the direction and in the presence of Myrtle Haller PA-C. Electronically Signed: [name], Scribe. 07/26/16. 6:51 PM.  History   Chief Complaint Chief Complaint  Patient presents with  . Hand Pain    HPI Comments: Jessica Sanchez is a 31 y.o. female with no pertinent medical history, who presents to the Emergency Department complaining of sudden-onset, constant right middle finger pain that began 3 days ago. Patient reports having a similar problem 3 years ago, in which her nail bed fell off. Patient reports associated drainage and swelling. No modifying factors indicated. Patient states the pain is worse when bending the finger. Patient denies injuring her finger, nail biting, numbness, weakness, or any other symptoms.   The history is provided by the patient. No language interpreter was used.    Past Medical History:  Diagnosis Date  . Fibroid (bleeding) (uterine)   . Fibroid, uterine   . H/O metrorrhagia   . IUD (intrauterine device) in place 07/11/2015  . Menorrhagia     Patient Active Problem List   Diagnosis Date Noted  . IUD (intrauterine device) in place 07/11/2015  . Other disorder of menstruation and other abnormal bleeding from female genital tract 10/30/2012    Past Surgical History:  Procedure Laterality Date  . HYSTEROSCOPY W/D&C N/A 09/27/2013   Procedure: DILATATION AND CURETTAGE /HYSTEROSCOPY and insertion of Mirena IUD ;  Surgeon: [name]. Harrington Challenger, MD;  Location: [facility];  Service: Gynecology;  Laterality: N/A;  . MYOMECTOMY  02/03/2011   Procedure: MYOMECTOMY;  Surgeon: [name], MD;  Location: [facility];  Service: Gynecology;  Laterality: N/A;  . TONSILLECTOMY      OB History    Gravida Para Term Preterm AB Living   1       1     SAB TAB Ectopic Multiple Live Births    1               Home Medications    Prior to Admission medications   Medication Sig Start Date End Date Taking? Authorizing Provider  azithromycin (ZITHROMAX) 250 MG tablet Take first 2 tablets together, then 1 every day until finished. 07/11/16   Alondra Sahni, PA-C  benzonatate (TESSALON) 200 MG capsule Take 1 capsule (200 mg total) by mouth 3 (three) times daily as needed. 07/19/16   [name], PA-C  levonorgestrel (MIRENA) 20 MCG/24HR IUD 1 each by Intrauterine route once.    Historical Provider, MD  Multiple Vitamins-Calcium (ONE-A-DAY WOMENS PO) Take 1 tablet by mouth daily.    Historical Provider, MD  naproxen (NAPROSYN) 500 MG tablet Take 1 tablet (500 mg total) by mouth 2 (two) times daily. 07/19/16   [name], PA-C  oseltamivir (TAMIFLU) 75 MG capsule Take 1 capsule (75 mg total) by mouth every 12 (twelve) hours. 06/14/16   Lily Kocher, PA-C  predniSONE (DELTASONE) 20 MG tablet Take 2 tablets (40 mg total) by mouth daily with breakfast. For the next four days 06/25/16   Carmin Muskrat, MD  promethazine-codeine [facility] WITH CODEINE) 6.25-10 MG/5ML syrup Take 5 mLs by mouth every 6 (six) hours as needed. 06/14/16   Lily Kocher, PA-C    Family History Family History  Problem Relation Age of Onset  . Cirrhosis Mother   . Diabetes Mother   . Cancer Maternal Grandfather   .  Hypertension Sister   . Diabetes Sister   . Anesthesia problems Neg Hx   . Hypotension Neg Hx   . Malignant hyperthermia Neg Hx   . Pseudochol deficiency Neg Hx     Social History Social History  Substance Use Topics  . Smoking status: Never Smoker  . Smokeless tobacco: Never Used  . Alcohol use No     Allergies   Shellfish allergy   Review of Systems Review of Systems  Constitutional: Negative for fever.  Gastrointestinal: Negative for nausea and vomiting.  Musculoskeletal: Positive for arthralgias (right middle finger) and joint swelling (right middle finger).       Right middle  finger drainage.   Neurological: Negative for weakness and numbness.     Physical Exam Updated Vital Signs BP 118/70 (BP Location: Left Arm)   Pulse 92   Temp 97.8 F (36.6 C) (Oral)   Resp 18   Ht 5\' 2"  (1.575 m)   Wt 275 lb (124.7 kg)   SpO2 98%   BMI 50.30 kg/m   Physical Exam  Constitutional: She is oriented to person, place, and time. She appears well-developed.  HENT:  Head: Normocephalic and atraumatic.  Mouth/Throat: Oropharynx is clear and moist.  Eyes: EOM are normal.  Cardiovascular: Normal rate.   Pulmonary/Chest: Effort normal.  Musculoskeletal: Normal range of motion. She exhibits edema and tenderness. She exhibits no deformity.  Tenderness with mild edema at the distal end of the right middle finger near the nail bed. No obvious erythema, no current drainage.  No paronychia or felon. Pt has full ROM of the digit.    Neurological: She is alert and oriented to person, place, and time. A sensory deficit is present.  Skin: Skin is warm and dry. Capillary refill takes less than 2 seconds.  Psychiatric: She has a normal mood and affect.  Nursing note and vitals reviewed.    ED Treatments / Results  DIAGNOSTIC STUDIES: Oxygen Saturation is 98% on RA, normal by my interpretation.    COORDINATION OF CARE: 6:50 PM Discussed treatment plan with pt at bedside and pt agreed to plan, which includes an antibiotic.   Labs (all labs ordered are listed, but only abnormal results are displayed) Labs Reviewed - No data to display  EKG  EKG Interpretation None       Radiology No results found.  Procedures Procedures (including critical care time)  Medications Ordered in ED Medications - No data to display   Initial Impression / Assessment and Plan / ED Course  I have reviewed the triage vital signs and the nursing notes.  Pertinent labs & imaging results that were available during my care of the patient were reviewed by me and considered in my medical  decision making (see chart for details).     XR of finger neg.  NV intact.  No skin changes.  No obvious felon or paronychia.  Possible spontaneous drainage of paronychia prior to arrival.  Appears stable for d/c, return precautions discussed.    Final Clinical Impressions(s) / ED Diagnoses   Final diagnoses:  Finger pain, right    New Prescriptions New Prescriptions   No medications on file   I personally performed the services described in this documentation, which was scribed in my presence. The recorded information has been reviewed and is accurate.     Kem Parkinson, PA-C 07/29/16 1251    Nat Christen, MD 07/29/16 [phone]

## 2016-08-07 ENCOUNTER — Emergency Department (HOSPITAL_COMMUNITY)
Admission: EM | Admit: 2016-08-07 | Discharge: 2016-08-07 | Disposition: A | Discharge status: Home / Self Care | Payer: Self-pay | Attending: Emergency Medicine | Admitting: Emergency Medicine

## 2016-08-07 ENCOUNTER — Emergency Department (HOSPITAL_COMMUNITY): Payer: Self-pay

## 2016-08-07 ENCOUNTER — Encounter (HOSPITAL_COMMUNITY): Payer: Self-pay | Admitting: Emergency Medicine

## 2016-08-07 DIAGNOSIS — I471 Supraventricular tachycardia: Secondary | ICD-10-CM | POA: Insufficient documentation

## 2016-08-07 DIAGNOSIS — Z79899 Other long term (current) drug therapy: Secondary | ICD-10-CM | POA: Insufficient documentation

## 2016-08-07 LAB — CBC WITH DIFFERENTIAL/PLATELET
BASOS ABS: 0 10*3/uL (ref 0.0–0.1)
Basophils Relative: 0 %
Eosinophils Absolute: 0.1 10*3/uL (ref 0.0–0.7)
Eosinophils Relative: 1 %
HEMATOCRIT: 38.8 % (ref 36.0–46.0)
HEMOGLOBIN: 12.8 g/dL (ref 12.0–15.0)
LYMPHS ABS: 2.3 10*3/uL (ref 0.7–4.0)
LYMPHS PCT: 41 %
MCH: 28.1 pg (ref 26.0–34.0)
MCHC: 33 g/dL (ref 30.0–36.0)
MCV: 85.3 fL (ref 78.0–100.0)
Monocytes Absolute: 0.5 10*3/uL (ref 0.1–1.0)
Monocytes Relative: 8 %
NEUTROS ABS: 2.8 10*3/uL (ref 1.7–7.7)
NEUTROS PCT: 50 %
PLATELETS: 340 10*3/uL (ref 150–400)
RBC: 4.55 MIL/uL (ref 3.87–5.11)
RDW: 14.3 % (ref 11.5–15.5)
WBC: 5.6 10*3/uL (ref 4.0–10.5)

## 2016-08-07 LAB — COMPREHENSIVE METABOLIC PANEL
ALK PHOS: 62 U/L (ref 38–126)
ALT: 15 U/L (ref 14–54)
AST: 16 U/L (ref 15–41)
Albumin: 3.9 g/dL (ref 3.5–5.0)
Anion gap: 7 (ref 5–15)
BILIRUBIN TOTAL: 0.4 mg/dL (ref 0.3–1.2)
BUN: 13 mg/dL (ref 6–20)
CALCIUM: 9.2 mg/dL (ref 8.9–10.3)
CHLORIDE: 104 mmol/L (ref 101–111)
CO2: 25 mmol/L (ref 22–32)
Creatinine, Ser: 0.59 mg/dL (ref 0.44–1.00)
Glucose, Bld: 84 mg/dL (ref 65–99)
Potassium: 4 mmol/L (ref 3.5–5.1)
Sodium: 136 mmol/L (ref 135–145)
Total Protein: 7.6 g/dL (ref 6.5–8.1)

## 2016-08-07 LAB — TSH: TSH: 2.674 u[IU]/mL (ref 0.350–4.500)

## 2016-08-07 LAB — TROPONIN I: Troponin I: <0.03 ng/mL (ref <0.03)

## 2016-08-07 MED ORDER — METOPROLOL SUCCINATE ER 25 MG PO TB24
25.0000 mg | ORAL_TABLET | Freq: Every day | ORAL | Status: DC
  Administered 2016-08-07: 25 mg via ORAL
  Filled 2016-08-07 (×3): qty 1

## 2016-08-07 MED ORDER — ADENOSINE 6 MG/2ML IV SOLN
6.0000 mg | Freq: Once | INTRAVENOUS | Status: AC
  Administered 2016-08-07: 6 mg via INTRAVENOUS
  Filled 2016-08-07: qty 2

## 2016-08-07 MED ORDER — METOPROLOL SUCCINATE ER 50 MG PO TB24
ORAL_TABLET | ORAL | Status: AC
  Filled 2016-08-07: qty 1

## 2016-08-07 MED ORDER — METOPROLOL SUCCINATE ER 25 MG PO TB24
ORAL_TABLET | ORAL | Status: AC
  Filled 2016-08-07: qty 1

## 2016-08-07 MED ORDER — ADENOSINE 6 MG/2ML IV SOLN
12.0000 mg | Freq: Once | INTRAVENOUS | Status: DC
  Filled 2016-08-07: qty 4

## 2016-08-07 MED ORDER — METOPROLOL SUCCINATE ER 25 MG PO TB24: 25.0000 mg | ORAL_TABLET | Freq: Every day | ORAL | 1 refills | Status: DC

## 2016-08-07 NOTE — ED Notes (Addendum)
Code cart at bedside.  Patient hocked up to [employer] heart monitor.

## 2016-08-07 NOTE — Discharge Instructions (Signed)
We will start a new medication to slow your heart rate a small amount. Follow-up your primary care doctor next week.

## 2016-08-07 NOTE — ED Triage Notes (Signed)
Pt reports feeling like heart racing with abd pain and vomiting blood.  States this happens all of the time, but she felt worse today.

## 2016-08-09 ENCOUNTER — Encounter (HOSPITAL_COMMUNITY): Payer: Self-pay | Admitting: *Deleted

## 2016-08-09 ENCOUNTER — Emergency Department (HOSPITAL_COMMUNITY)
Admission: EM | Admit: 2016-08-09 | Discharge: 2016-08-09 | Disposition: A | Discharge status: Home / Self Care | Payer: Self-pay | Attending: Emergency Medicine | Admitting: Emergency Medicine

## 2016-08-09 ENCOUNTER — Emergency Department (HOSPITAL_COMMUNITY): Payer: Self-pay

## 2016-08-09 DIAGNOSIS — R531 Weakness: Secondary | ICD-10-CM | POA: Insufficient documentation

## 2016-08-09 DIAGNOSIS — R079 Chest pain, unspecified: Secondary | ICD-10-CM

## 2016-08-09 DIAGNOSIS — R0789 Other chest pain: Secondary | ICD-10-CM | POA: Insufficient documentation

## 2016-08-09 DIAGNOSIS — R5383 Other fatigue: Secondary | ICD-10-CM | POA: Insufficient documentation

## 2016-08-09 LAB — CBC WITH DIFFERENTIAL/PLATELET
Basophils Absolute: 0 10*3/uL (ref 0.0–0.1)
Basophils Relative: 0 %
EOS ABS: 0.1 10*3/uL (ref 0.0–0.7)
EOS PCT: 2 %
HCT: 36.5 % (ref 36.0–46.0)
HEMOGLOBIN: 12.1 g/dL (ref 12.0–15.0)
LYMPHS ABS: 1.8 10*3/uL (ref 0.7–4.0)
LYMPHS PCT: 29 %
MCH: 28.2 pg (ref 26.0–34.0)
MCHC: 33.2 g/dL (ref 30.0–36.0)
MCV: 85.1 fL (ref 78.0–100.0)
MONOS PCT: 8 %
Monocytes Absolute: 0.5 10*3/uL (ref 0.1–1.0)
NEUTROS PCT: 61 %
Neutro Abs: 3.8 10*3/uL (ref 1.7–7.7)
Platelets: 281 10*3/uL (ref 150–400)
RBC: 4.29 MIL/uL (ref 3.87–5.11)
RDW: 14.1 % (ref 11.5–15.5)
WBC: 6.2 10*3/uL (ref 4.0–10.5)

## 2016-08-09 LAB — COMPREHENSIVE METABOLIC PANEL
ALT: 17 U/L (ref 14–54)
AST: 18 U/L (ref 15–41)
Albumin: 3.5 g/dL (ref 3.5–5.0)
Alkaline Phosphatase: 62 U/L (ref 38–126)
Anion gap: 7 (ref 5–15)
BILIRUBIN TOTAL: 0.2 mg/dL — AB (ref 0.3–1.2)
BUN: 15 mg/dL (ref 6–20)
CO2: 25 mmol/L (ref 22–32)
CREATININE: 0.73 mg/dL (ref 0.44–1.00)
Calcium: 8.9 mg/dL (ref 8.9–10.3)
Chloride: 104 mmol/L (ref 101–111)
Glucose, Bld: 96 mg/dL (ref 65–99)
POTASSIUM: 3.9 mmol/L (ref 3.5–5.1)
Sodium: 136 mmol/L (ref 135–145)
TOTAL PROTEIN: 6.7 g/dL (ref 6.5–8.1)

## 2016-08-09 LAB — I-STAT BETA HCG BLOOD, ED (MC, WL, AP ONLY)

## 2016-08-09 LAB — TROPONIN I

## 2016-08-09 LAB — D-DIMER, QUANTITATIVE: D-Dimer, Quant: 0.5 ug/mL-FEU (ref 0.00–0.50)

## 2016-08-09 MED ORDER — NAPROXEN 500 MG PO TABS: 500.0000 mg | ORAL_TABLET | Freq: Two times a day (BID) | ORAL | 0 refills | Status: DC

## 2016-08-09 MED ORDER — ONDANSETRON 4 MG PO TBDP
4.0000 mg | ORAL_TABLET | Freq: Once | ORAL | Status: AC
  Administered 2016-08-09: 4 mg via ORAL
  Filled 2016-08-09: qty 1

## 2016-08-09 MED ORDER — ASPIRIN 81 MG PO CHEW
324.0000 mg | CHEWABLE_TABLET | Freq: Once | ORAL | Status: AC
  Administered 2016-08-09: 324 mg via ORAL
  Filled 2016-08-09: qty 4

## 2016-08-09 NOTE — ED Provider Notes (Signed)
[facility] DEPT Provider Note   CSN: [id number] Arrival date & time: 08/09/16  1428     History   Chief Complaint Chief Complaint  Patient presents with  . Chest Pain    HPI Jessica Sanchez is a 31 y.o. female.  HPI Pt was seen in the ED recently on 4/7 for tachycardia.  Notes not available from that visit in the EMR.  Pt states she has not felt well since then.  She continues to have pain in her chest and feels like her heart is racing.  The pain is sharp on both sides of her chest. Her body is aching all over and she has generalized fatigue and weakness.  Her symptoms continued and she thought her heart was racing again so she came to the ED.  No fevers.  No cough.  No leg swelling.  No history of heart disease, PE or DVT.  No thyroid history. Past Medical History:  Diagnosis Date  . Fibroid (bleeding) (uterine)   . Fibroid, uterine   . H/O metrorrhagia   . IUD (intrauterine device) in place 07/11/2015  . Menorrhagia     Patient Active Problem List   Diagnosis Date Noted  . IUD (intrauterine device) in place 07/11/2015  . Other disorder of menstruation and other abnormal bleeding from female genital tract 10/30/2012    Past Surgical History:  Procedure Laterality Date  . HYSTEROSCOPY W/D&C N/A 09/27/2013   Procedure: DILATATION AND CURETTAGE /HYSTEROSCOPY and insertion of Mirena IUD ;  Surgeon: [name]. Harrington Challenger, MD;  Location: [facility];  Service: Gynecology;  Laterality: N/A;  . MYOMECTOMY  02/03/2011   Procedure: MYOMECTOMY;  Surgeon: [name], MD;  Location: [facility];  Service: Gynecology;  Laterality: N/A;  . TONSILLECTOMY      OB History    Gravida Para Term Preterm AB Living   1       1     SAB TAB Ectopic Multiple Live Births   1               Home Medications    Prior to Admission medications   Medication Sig Start Date End Date Taking? Authorizing Provider  levonorgestrel (MIRENA) 20 MCG/24HR IUD 1 each by Intrauterine route once.   Yes Historical  Provider, MD  Multiple Vitamins-Calcium (ONE-A-DAY WOMENS PO) Take 1 tablet by mouth daily.   Yes Historical Provider, MD  azithromycin (ZITHROMAX) 250 MG tablet Take first 2 tablets together, then 1 every day until finished. Patient not taking: Reported on 08/09/2016 07/11/16   Tammy Triplett, PA-C  benzonatate (TESSALON) 200 MG capsule Take 1 capsule (200 mg total) by mouth 3 (three) times daily as needed. Patient not taking: Reported on 08/09/2016 07/19/16   [name], PA-C  cephALEXin (KEFLEX) 500 MG capsule Take 1 capsule (500 mg total) by mouth 4 (four) times daily. For 7 days Patient not taking: Reported on 08/09/2016 07/26/16   Tammy Triplett, PA-C  metoprolol succinate (TOPROL-XL) 25 MG 24 hr tablet Take 1 tablet (25 mg total) by mouth daily. 08/07/16   Nat Christen, MD  naproxen (NAPROSYN) 500 MG tablet Take 1 tablet (500 mg total) by mouth 2 (two) times daily. 08/09/16   Dorie Rank, MD  oseltamivir (TAMIFLU) 75 MG capsule Take 1 capsule (75 mg total) by mouth every 12 (twelve) hours. Patient not taking: Reported on 08/09/2016 06/14/16   Lily Kocher, PA-C  predniSONE (DELTASONE) 20 MG tablet Take 2 tablets (40 mg total) by mouth daily with  breakfast. For the next four days Patient not taking: Reported on 08/09/2016 06/25/16   Carmin Muskrat, MD  promethazine-codeine [facility] WITH CODEINE) 6.25-10 MG/5ML syrup Take 5 mLs by mouth every 6 (six) hours as needed. Patient not taking: Reported on 08/09/2016 06/14/16   Lily Kocher, PA-C    Family History Family History  Problem Relation Age of Onset  . Cirrhosis Mother   . Diabetes Mother   . Cancer Maternal Grandfather   . Hypertension Sister   . Diabetes Sister   . Anesthesia problems Neg Hx   . Hypotension Neg Hx   . Malignant hyperthermia Neg Hx   . Pseudochol deficiency Neg Hx     Social History Social History  Substance Use Topics  . Smoking status: Never Smoker  . Smokeless tobacco: Never Used  . Alcohol use No     Allergies     Shellfish allergy   Review of Systems Review of Systems  All other systems reviewed and are negative.    Physical Exam Updated Vital Signs BP 116/75   Pulse 89   Temp 98.2 F (36.8 C) (Oral)   Resp (!) 21   Ht 5\' 2"  (1.575 m)   Wt 124.7 kg   SpO2 95%   BMI 50.30 kg/m   Physical Exam  Constitutional: She appears well-developed and well-nourished. No distress.  HENT:  Head: Normocephalic and atraumatic.  Right Ear: External ear normal.  Left Ear: External ear normal.  Eyes: Conjunctivae are normal. Right eye exhibits no discharge. Left eye exhibits no discharge. No scleral icterus.  Neck: Neck supple. No tracheal deviation present.  Cardiovascular: Normal rate, regular rhythm and intact distal pulses.   Pulmonary/Chest: Effort normal and breath sounds normal. No stridor. No respiratory distress. She has no wheezes. She has no rales. She exhibits tenderness.  Lower anterior chest wall   Abdominal: Soft. Bowel sounds are normal. She exhibits no distension. There is no tenderness. There is no rebound and no guarding.  Musculoskeletal: She exhibits no edema or tenderness.  Neurological: She is alert. She has normal strength. No cranial nerve deficit (no facial droop, extraocular movements intact, no slurred speech) or sensory deficit. She exhibits normal muscle tone. She displays no seizure activity. Coordination normal.  Skin: Skin is warm and dry. No rash noted.  Psychiatric: She has a normal mood and affect.  Nursing note and vitals reviewed.    ED Treatments / Results  Labs (all labs ordered are listed, but only abnormal results are displayed) Labs Reviewed  COMPREHENSIVE METABOLIC PANEL - Abnormal; Notable for the following:       Result Value   Total Bilirubin 0.2 (*)    All other components within normal limits  CBC WITH DIFFERENTIAL/PLATELET  D-DIMER, QUANTITATIVE (NOT AT [facility])  TROPONIN I  I-STAT BETA HCG BLOOD, ED ([facility], [facility], [facility] ONLY)    EKG  EKG  Interpretation  Date/Time:  Monday August 09 2016 14:49:30 EDT Ventricular Rate:  91 PR Interval:    QRS Duration: 91 QT Interval:  364 QTC Calculation: 448 R Axis:   26 Text Interpretation:  Sinus rhythm Probable left atrial enlargement Low voltage, precordial leads Nonspecific T abnormalities, anterior leads No significant change since last tracing Confirmed by Nardos Putnam  MD-J, Sotirios Navarro ([id number]) on 08/09/2016 2:59:52 PM       Radiology Dg Chest 2 View  Result Date: 08/09/2016 CLINICAL DATA:  Chest pain.  Shortness of breath. EXAM: CHEST  2 VIEW COMPARISON:  08/07/2016 FINDINGS: The heart size and  mediastinal contours are within normal limits. Both lungs are clear. The visualized skeletal structures are unremarkable. IMPRESSION: Normal exam. Electronically Signed   By: Lorriane Shire M.D.   On: 08/09/2016 16:09    Procedures Procedures (including critical care time)  Medications Ordered in ED Medications  aspirin chewable tablet 324 mg (324 mg Oral Given 08/09/16 1529)  ondansetron (ZOFRAN-ODT) disintegrating tablet 4 mg (4 mg Oral Given 08/09/16 1529)     Initial Impression / Assessment and Plan / ED Course  I have reviewed the triage vital signs and the nursing notes.  Pertinent labs & imaging results that were available during my care of the patient were reviewed by me and considered in my medical decision making (see chart for details).   Pt presents to the ED with persistent chest pain.   Recent visit to the ED with a diagnosis of SVT.  NSR here in the ED.  Labs including troponin normal despite several days of sx.  Doubt ACS, myocarditis.  D dimer negative.  Doubt PE.  At this time there does not appear to be any evidence of an acute emergency medical condition and the patient appears stable for discharge with appropriate outpatient follow up.   Final Clinical Impressions(s) / ED Diagnoses   Final diagnoses:  Chest pain with low risk for cardiac etiology    New Prescriptions New  Prescriptions   NAPROXEN (NAPROSYN) 500 MG TABLET    Take 1 tablet (500 mg total) by mouth 2 (two) times daily.     Dorie Rank, MD 08/09/16 [phone]

## 2016-08-09 NOTE — Discharge Instructions (Signed)
Test results today were reassuring. No signs of any serious illness associated with her heart or lungs. Take medications as needed for pain, follow-up with your primary care doctor

## 2016-08-09 NOTE — ED Triage Notes (Signed)
Pt c/o heart racing, dizziness, mid chest pain described as "pounding". Pt reports SOB.

## 2016-08-09 NOTE — ED Notes (Signed)
Pt returned from xray

## 2016-08-15 ENCOUNTER — Emergency Department (HOSPITAL_COMMUNITY)
Admission: EM | Admit: 2016-08-15 | Discharge: 2016-08-15 | Disposition: A | Discharge status: Home / Self Care | Payer: Self-pay | Attending: Emergency Medicine | Admitting: Emergency Medicine

## 2016-08-15 ENCOUNTER — Encounter (HOSPITAL_COMMUNITY): Payer: Self-pay

## 2016-08-15 DIAGNOSIS — L03311 Cellulitis of abdominal wall: Secondary | ICD-10-CM | POA: Insufficient documentation

## 2016-08-15 LAB — CBG MONITORING, ED
Glucose-Capillary: 86 mg/dL (ref 65–99)
Glucose-Capillary: 85 mg/dL (ref 65–99)

## 2016-08-15 MED ORDER — SULFAMETHOXAZOLE-TRIMETHOPRIM 800-160 MG PO TABS: 1.0000 | ORAL_TABLET | Freq: Two times a day (BID) | ORAL | 0 refills | Status: AC

## 2016-08-15 MED ORDER — SULFAMETHOXAZOLE-TRIMETHOPRIM 800-160 MG PO TABS
1.0000 | ORAL_TABLET | Freq: Once | ORAL | Status: AC
  Administered 2016-08-15: 1 via ORAL
  Filled 2016-08-15: qty 1

## 2016-08-15 MED ORDER — SULFAMETHOXAZOLE-TRIMETHOPRIM 200-40 MG/5ML PO SUSP: 20.0000 mL | Freq: Once | ORAL | Status: DC

## 2016-08-15 NOTE — ED Provider Notes (Signed)
[facility] DEPT Provider Note   CSN: [id number] Arrival date & time: 08/15/16  1843  By signing my name below, I, Margit Banda, attest that this documentation has been prepared under the direction and in the presence of Helen Hashimoto, [place]. Electronically Signed: Margit Banda, ED Scribe. 08/15/16. 7:25 PM.  History   Chief Complaint Chief Complaint  Patient presents with  . Abscess    HPI Jessica Sanchez is a 31 y.o. female who presents to the Emergency Department complaining of a gradually worsening bump in her vaginal area for the last week. Associated sx include a fever, chills, and sweat. Pt has been trying to keep the area, clean and dry. Pt denies vaginal odor, or discharge at this time.   The history is provided by the patient. No language interpreter was used.    Past Medical History:  Diagnosis Date  . Fibroid (bleeding) (uterine)   . Fibroid, uterine   . H/O metrorrhagia   . IUD (intrauterine device) in place 07/11/2015  . Menorrhagia     Patient Active Problem List   Diagnosis Date Noted  . IUD (intrauterine device) in place 07/11/2015  . Other disorder of menstruation and other abnormal bleeding from female genital tract 10/30/2012    Past Surgical History:  Procedure Laterality Date  . HYSTEROSCOPY W/D&C N/A 09/27/2013   Procedure: DILATATION AND CURETTAGE /HYSTEROSCOPY and insertion of Mirena IUD ;  Surgeon: [name]. Harrington Challenger, MD;  Location: [facility];  Service: Gynecology;  Laterality: N/A;  . MYOMECTOMY  02/03/2011   Procedure: MYOMECTOMY;  Surgeon: [name], MD;  Location: [facility];  Service: Gynecology;  Laterality: N/A;  . TONSILLECTOMY      OB History    Gravida Para Term Preterm AB Living   1       1     SAB TAB Ectopic Multiple Live Births   1               Home Medications    Prior to Admission medications   Medication Sig Start Date End Date Taking? Authorizing Provider  azithromycin (ZITHROMAX) 250 MG tablet Take first 2 tablets  together, then 1 every day until finished. Patient not taking: Reported on 08/09/2016 07/11/16   Tammy Triplett, PA-C  benzonatate (TESSALON) 200 MG capsule Take 1 capsule (200 mg total) by mouth 3 (three) times daily as needed. Patient not taking: Reported on 08/09/2016 07/19/16   [name], PA-C  cephALEXin (KEFLEX) 500 MG capsule Take 1 capsule (500 mg total) by mouth 4 (four) times daily. For 7 days Patient not taking: Reported on 08/09/2016 07/26/16   Kem Parkinson, PA-C  levonorgestrel (MIRENA) 20 MCG/24HR IUD 1 each by Intrauterine route once.    Historical Provider, MD  metoprolol succinate (TOPROL-XL) 25 MG 24 hr tablet Take 1 tablet (25 mg total) by mouth daily. 08/07/16   Nat Christen, MD  Multiple Vitamins-Calcium (ONE-A-DAY WOMENS PO) Take 1 tablet by mouth daily.    Historical Provider, MD  naproxen (NAPROSYN) 500 MG tablet Take 1 tablet (500 mg total) by mouth 2 (two) times daily. 08/09/16   Dorie Rank, MD  oseltamivir (TAMIFLU) 75 MG capsule Take 1 capsule (75 mg total) by mouth every 12 (twelve) hours. Patient not taking: Reported on 08/09/2016 06/14/16   Lily Kocher, PA-C  predniSONE (DELTASONE) 20 MG tablet Take 2 tablets (40 mg total) by mouth daily with breakfast. For the next four days Patient not taking: Reported on 08/09/2016 06/25/16   Herbie Baltimore  Vanita Panda, MD  promethazine-codeine [facility] WITH CODEINE) 6.25-10 MG/5ML syrup Take 5 mLs by mouth every 6 (six) hours as needed. Patient not taking: Reported on 08/09/2016 06/14/16   Lily Kocher, PA-C    Family History Family History  Problem Relation Age of Onset  . Cirrhosis Mother   . Diabetes Mother   . Cancer Maternal Grandfather   . Hypertension Sister   . Diabetes Sister   . Anesthesia problems Neg Hx   . Hypotension Neg Hx   . Malignant hyperthermia Neg Hx   . Pseudochol deficiency Neg Hx     Social History Social History  Substance Use Topics  . Smoking status: Never Smoker  . Smokeless tobacco: Never Used  . Alcohol use No      Allergies   Shellfish allergy   Review of Systems Review of Systems  Constitutional: Positive for chills and fever.  Genitourinary: Negative for vaginal discharge.  Skin: Positive for wound.  All other systems reviewed and are negative.    Physical Exam Updated Vital Signs BP 127/66 (BP Location: Left Arm)   Pulse 87   Temp 97.6 F (36.4 C) (Oral)   Resp 16   Ht 5\' 2"  (1.575 m)   Wt 275 lb (124.7 kg)   SpO2 96%   BMI 50.30 kg/m   Physical Exam  Constitutional: She appears well-developed and well-nourished. No distress.  HENT:  Head: Normocephalic and atraumatic.  Neck: Neck supple.  Cardiovascular: Normal rate, regular rhythm and normal heart sounds.   No murmur heard. Pulmonary/Chest: Effort normal and breath sounds normal. No respiratory distress. She has no wheezes. She has no rales.  Genitourinary:  Genitourinary Comments: 14 cm by 1 cm erythemas area under mid abdominal panis.  Musculoskeletal: Normal range of motion.  Neurological: She is alert.  Skin: Skin is warm and dry.  Nursing note and vitals reviewed.    ED Treatments / Results  DIAGNOSTIC STUDIES: Oxygen Saturation is 96% on RA, adequate by my interpretation.   COORDINATION OF CARE: 7:22 PM-Discussed next steps with pt. Pt verbalized understanding and is agreeable with the plan.    Labs (all labs ordered are listed, but only abnormal results are displayed) Labs Reviewed  CBG MONITORING, ED    EKG  EKG Interpretation None       Radiology No results found.  Procedures Procedures (including critical care time)  Medications Ordered in ED Medications  sulfamethoxazole-trimethoprim (BACTRIM,SEPTRA) 200-40 MG/5ML suspension 20 mL (not administered)     Initial Impression / Assessment and Plan / ED Course  I have reviewed the triage vital signs and the nursing notes.  Pertinent labs & imaging results that were available during my care of the patient were reviewed by me and  considered in my medical decision making (see chart for details).       Final Clinical Impressions(s) / ED Diagnoses   Final diagnoses:  Cellulitis, abdominal wall    New Prescriptions Discharge Medication List as of 08/15/2016  7:27 PM    START taking these medications   Details  sulfamethoxazole-trimethoprim (BACTRIM DS,SEPTRA DS) 800-160 MG tablet Take 1 tablet by mouth 2 (two) times daily., Starting Sun 08/15/2016, Until Sun 08/22/2016, Print       An After Visit Summary was printed and given to the patient.  I personally performed the services in this documentation, which was scribed in my presence.  The recorded information has been reviewed and considered.   Ronnald Collum.     Fransico Meadow, PA-C  08/15/16 2144    Dorie Rank, MD 08/18/16 1401

## 2016-08-15 NOTE — ED Triage Notes (Signed)
Patient states that she has an area in her pelvic region that the skin is coming up and there is a knot there.  States that it is really sore and causing her a lot of pain.  Patient states there is drainage and odor coming from the area.

## 2016-08-15 NOTE — ED Notes (Signed)
cbg 85, pt having no complaints at this time.

## 2016-08-15 NOTE — Discharge Instructions (Signed)
Return if any problems.

## 2016-08-15 NOTE — ED Notes (Signed)
Pt made aware to return if symptoms worsen or if any life threatening symptoms occur.   

## 2016-08-16 NOTE — ED Provider Notes (Signed)
[place] DEPT Provider Note   CSN: [id number] Arrival date & time: 08/07/16  1444     History   Chief Complaint Chief Complaint  Patient presents with  . Tachycardia  . Abdominal Pain    HPI Jessica Sanchez is a 31 y.o. female.  Patient presents with a sensation of a rapid heart rate since this morning along with vomiting, lightheadedness, chest pain, abdominal pain. No crushing central chest pain, dyspnea, fever, sweats, chills.  Severity symptoms is mild to moderate. Nothing makes symptoms better or worse.      Past Medical History:  Diagnosis Date  . Fibroid (bleeding) (uterine)   . Fibroid, uterine   . H/O metrorrhagia   . IUD (intrauterine device) in place 07/11/2015  . Menorrhagia     Patient Active Problem List   Diagnosis Date Noted  . IUD (intrauterine device) in place 07/11/2015  . Other disorder of menstruation and other abnormal bleeding from female genital tract 10/30/2012    Past Surgical History:  Procedure Laterality Date  . HYSTEROSCOPY W/D&C N/A 09/27/2013   Procedure: DILATATION AND CURETTAGE /HYSTEROSCOPY and insertion of Mirena IUD ;  Surgeon: [name]. Harrington Challenger, MD;  Location: [facility];  Service: Gynecology;  Laterality: N/A;  . MYOMECTOMY  02/03/2011   Procedure: MYOMECTOMY;  Surgeon: [name], MD;  Location: [facility];  Service: Gynecology;  Laterality: N/A;  . TONSILLECTOMY      OB History    Gravida Para Term Preterm AB Living   1       1     SAB TAB Ectopic Multiple Live Births   1               Home Medications    Prior to Admission medications   Medication Sig Start Date End Date Taking? Authorizing Provider  azithromycin (ZITHROMAX) 250 MG tablet Take first 2 tablets together, then 1 every day until finished. Patient not taking: Reported on 08/09/2016 07/11/16   Tammy Triplett, PA-C  benzonatate (TESSALON) 200 MG capsule Take 1 capsule (200 mg total) by mouth 3 (three) times daily as needed. Patient not taking: Reported on  08/09/2016 07/19/16   [name], PA-C  cephALEXin (KEFLEX) 500 MG capsule Take 1 capsule (500 mg total) by mouth 4 (four) times daily. For 7 days Patient not taking: Reported on 08/09/2016 07/26/16   Kem Parkinson, PA-C  levonorgestrel (MIRENA) 20 MCG/24HR IUD 1 each by Intrauterine route once.    Historical Provider, MD  metoprolol succinate (TOPROL-XL) 25 MG 24 hr tablet Take 1 tablet (25 mg total) by mouth daily. 08/07/16   Nat Christen, MD  Multiple Vitamins-Calcium (ONE-A-DAY WOMENS PO) Take 1 tablet by mouth daily.    Historical Provider, MD  naproxen (NAPROSYN) 500 MG tablet Take 1 tablet (500 mg total) by mouth 2 (two) times daily. 08/09/16   Dorie Rank, MD  oseltamivir (TAMIFLU) 75 MG capsule Take 1 capsule (75 mg total) by mouth every 12 (twelve) hours. Patient not taking: Reported on 08/09/2016 06/14/16   Lily Kocher, PA-C  predniSONE (DELTASONE) 20 MG tablet Take 2 tablets (40 mg total) by mouth daily with breakfast. For the next four days Patient not taking: Reported on 08/09/2016 06/25/16   Carmin Muskrat, MD  promethazine-codeine [facility] WITH CODEINE) 6.25-10 MG/5ML syrup Take 5 mLs by mouth every 6 (six) hours as needed. Patient not taking: Reported on 08/09/2016 06/14/16   Lily Kocher, PA-C  sulfamethoxazole-trimethoprim (BACTRIM DS,SEPTRA DS) 800-160 MG tablet Take 1 tablet by mouth  2 (two) times daily. 08/15/16 08/22/16  Fransico Meadow, PA-C    Family History Family History  Problem Relation Age of Onset  . Cirrhosis Mother   . Diabetes Mother   . Cancer Maternal Grandfather   . Hypertension Sister   . Diabetes Sister   . Anesthesia problems Neg Hx   . Hypotension Neg Hx   . Malignant hyperthermia Neg Hx   . Pseudochol deficiency Neg Hx     Social History Social History  Substance Use Topics  . Smoking status: Never Smoker  . Smokeless tobacco: Never Used  . Alcohol use No     Allergies   Shellfish allergy   Review of Systems Review of Systems  All other systems  reviewed and are negative.    Physical Exam Updated Vital Signs BP 118/88   Pulse (!) 104   Temp 99 F (37.2 C) (Oral)   Resp (!) 24   Ht 5\' 2"  (1.575 m)   Wt 275 lb (124.7 kg)   SpO2 99%   BMI 50.30 kg/m   Physical Exam  Constitutional: She is oriented to person, place, and time. She appears well-developed and well-nourished.  HENT:  Head: Normocephalic and atraumatic.  Eyes: Conjunctivae are normal.  Neck: Neck supple.  Cardiovascular: Regular rhythm.   Tachycardic  Pulmonary/Chest: Effort normal and breath sounds normal.  Abdominal: Soft. Bowel sounds are normal.  Musculoskeletal: Normal range of motion.  Neurological: She is alert and oriented to person, place, and time.  Skin: Skin is warm and dry.  Psychiatric: She has a normal mood and affect. Her behavior is normal.  Nursing note and vitals reviewed.    ED Treatments / Results  Labs (all labs ordered are listed, but only abnormal results are displayed) Labs Reviewed  CBC WITH DIFFERENTIAL/PLATELET  COMPREHENSIVE METABOLIC PANEL  TROPONIN I  TSH  TROPONIN I    EKG  EKG Interpretation  Date/Time:  Saturday August 07 2016 17:12:59 EDT Ventricular Rate:  118 PR Interval:    QRS Duration: 80 QT Interval:  296 QTC Calculation: 415 R Axis:   42 Text Interpretation:  Sinus tachycardia Otherwise no significant change Confirmed by [facility] MD, PEDRO ([id number]) on 08/08/2016 9:06:59 PM       Radiology No results found.  Procedures Procedures (including critical care time)  Medications Ordered in ED Medications  adenosine (ADENOCARD) 6 MG/2ML injection 6 mg (6 mg Intravenous Given 08/07/16 1711)     Initial Impression / Assessment and Plan / ED Course  I have reviewed the triage vital signs and the nursing notes.  Pertinent labs & imaging results that were available during my care of the patient were reviewed by me and considered in my medical decision making (see chart for details).    CRITICAL  CARE Performed by: Nat Christen Total critical care time: 30 minutes Critical care time was exclusive of separately billable procedures and treating other patients. Critical care was necessary to treat or prevent imminent or life-threatening deterioration. Critical care was time spent personally by me on the following activities: development of treatment plan with patient and/or surrogate as well as nursing, discussions with consultants, evaluation of patient's response to treatment, examination of patient, obtaining history from patient or surrogate, ordering and performing treatments and interventions, ordering and review of laboratory studies, ordering and review of radiographic studies, pulse oximetry and re-evaluation of patient's condition.  Patient was in a presumed rhythm of SVT. I discussed her care with the cardiologist on call. He recommended adenosine  intravenously adenosine 6 mg IV administered. Patient converted back to sinus rhythm. She was monitored for over an hour. Discharge medications metoprolol XL 25 mg daily Final Clinical Impressions(s) / ED Diagnoses   Final diagnoses:  SVT (supraventricular tachycardia) ([place])    New Prescriptions Discharge Medication List as of 08/07/2016  8:54 PM    START taking these medications   Details  metoprolol succinate (TOPROL-XL) 25 MG 24 hr tablet Take 1 tablet (25 mg total) by mouth daily., Starting Sat 08/07/2016, Print         Nat Christen, MD 08/16/16 [phone]

## 2016-08-17 ENCOUNTER — Emergency Department (HOSPITAL_COMMUNITY)
Admission: EM | Admit: 2016-08-17 | Discharge: 2016-08-17 | Disposition: A | Discharge status: Home / Self Care | Payer: Self-pay | Attending: Emergency Medicine | Admitting: Emergency Medicine

## 2016-08-17 DIAGNOSIS — Z79899 Other long term (current) drug therapy: Secondary | ICD-10-CM | POA: Insufficient documentation

## 2016-08-17 DIAGNOSIS — R55 Syncope and collapse: Secondary | ICD-10-CM | POA: Insufficient documentation

## 2016-08-17 DIAGNOSIS — M6281 Muscle weakness (generalized): Secondary | ICD-10-CM | POA: Insufficient documentation

## 2016-08-17 DIAGNOSIS — R29898 Other symptoms and signs involving the musculoskeletal system: Secondary | ICD-10-CM

## 2016-08-17 DIAGNOSIS — R519 Headache, unspecified: Secondary | ICD-10-CM

## 2016-08-17 DIAGNOSIS — R51 Headache: Secondary | ICD-10-CM | POA: Insufficient documentation

## 2016-08-17 LAB — CBC
HEMATOCRIT: 38.1 % (ref 36.0–46.0)
Hemoglobin: 12.6 g/dL (ref 12.0–15.0)
MCH: 28.1 pg (ref 26.0–34.0)
MCHC: 33.1 g/dL (ref 30.0–36.0)
MCV: 85 fL (ref 78.0–100.0)
Platelets: 287 10*3/uL (ref 150–400)
RBC: 4.48 MIL/uL (ref 3.87–5.11)
RDW: 14.4 % (ref 11.5–15.5)
WBC: 4.4 10*3/uL (ref 4.0–10.5)

## 2016-08-17 LAB — URINALYSIS, ROUTINE W REFLEX MICROSCOPIC
BACTERIA UA: NONE SEEN
Bilirubin Urine: NEGATIVE
Glucose, UA: NEGATIVE mg/dL
Ketones, ur: NEGATIVE mg/dL
Leukocytes, UA: NEGATIVE
NITRITE: NEGATIVE
PH: 6 (ref 5.0–8.0)
Protein, ur: NEGATIVE mg/dL
SPECIFIC GRAVITY, URINE: 1.013 (ref 1.005–1.030)

## 2016-08-17 LAB — BASIC METABOLIC PANEL
Anion gap: 8 (ref 5–15)
BUN: 13 mg/dL (ref 6–20)
CHLORIDE: 103 mmol/L (ref 101–111)
CO2: 25 mmol/L (ref 22–32)
CREATININE: 0.67 mg/dL (ref 0.44–1.00)
Calcium: 9 mg/dL (ref 8.9–10.3)
GFR calc Af Amer: >60 mL/min (ref >60)
Glucose, Bld: 91 mg/dL (ref 65–99)
Potassium: 3.8 mmol/L (ref 3.5–5.1)
Sodium: 136 mmol/L (ref 135–145)

## 2016-08-17 LAB — CBG MONITORING, ED: Glucose-Capillary: 93 mg/dL (ref 65–99)

## 2016-08-17 MED ORDER — PROCHLORPERAZINE EDISYLATE 5 MG/ML IJ SOLN
10.0000 mg | Freq: Once | INTRAMUSCULAR | Status: AC
  Administered 2016-08-17: 10 mg via INTRAMUSCULAR
  Filled 2016-08-17: qty 2

## 2016-08-17 MED ORDER — KETOROLAC TROMETHAMINE 60 MG/2ML IM SOLN
60.0000 mg | Freq: Once | INTRAMUSCULAR | Status: AC
  Administered 2016-08-17: 60 mg via INTRAMUSCULAR
  Filled 2016-08-17: qty 2

## 2016-08-17 NOTE — ED Notes (Signed)
Lab at bedside

## 2016-08-17 NOTE — ED Provider Notes (Signed)
[place] DEPT Provider Note   CSN: [id number] Arrival date & time: 08/17/16  1329     History   Chief Complaint Chief Complaint  Patient presents with  . Loss of Consciousness    HPI Jessica Sanchez is a 31 y.o. female.  HPI  The patient is a 31 year old female, she has a history of several medical problems including anemia from menorrhagia, she reports a history of a myomectomy and a tonsillectomy, she denies other daily medical medications that she takes. She reports that over the last several months she has had several episodes where she felt like she was going to pass out during which time she feels like the room is spinning and she is having vertigo, when she stands up she becomes more vertiginous and sometimes loses her balance and falls. Today while she was at a restaurant getting ready to eat she stood up from the booth where she was sitting, she lost consciousness and fell to the [place], the family member state that for around 5 minutes she was in and out of consciousness, he reports that there was no focal neurologic abnormalities including weakness one partner another or seizure-like activity however the patient noted afterwards that she was having some difficulty using her left hand and in fact in the triage area during assessment she was noted to have some weak left grip. The patient does report that prior to this happening she was feeling some tingling in her right hand. She denies any lower extremity issues. Her family members report that she refused ambulance transport but was able to get up from the booth and walk to the car with the family members with minimal assistance. At this time the patient states she is doing much better, she has a moderate headache, she has no visual changes, no difficulty with speech, she has no numbness but still complains of left hand weakness. This occurred just prior to arrival. Medical record review shows that the patient has had a history of  neuro imaging in the past and in fact approximately one year ago she did present with left hand weakness and had a CT scan of the brain which was negative.  Past Medical History:  Diagnosis Date  . Fibroid (bleeding) (uterine)   . Fibroid, uterine   . H/O metrorrhagia   . IUD (intrauterine device) in place 07/11/2015  . Menorrhagia     Patient Active Problem List   Diagnosis Date Noted  . IUD (intrauterine device) in place 07/11/2015  . Other disorder of menstruation and other abnormal bleeding from female genital tract 10/30/2012    Past Surgical History:  Procedure Laterality Date  . HYSTEROSCOPY W/D&C N/A 09/27/2013   Procedure: DILATATION AND CURETTAGE /HYSTEROSCOPY and insertion of Mirena IUD ;  Surgeon: [name]. Harrington Challenger, MD;  Location: [facility];  Service: Gynecology;  Laterality: N/A;  . MYOMECTOMY  02/03/2011   Procedure: MYOMECTOMY;  Surgeon: [name], MD;  Location: [facility];  Service: Gynecology;  Laterality: N/A;  . TONSILLECTOMY      OB History    Gravida Para Term Preterm AB Living   1       1     SAB TAB Ectopic Multiple Live Births   1               Home Medications    Prior to Admission medications   Medication Sig Start Date End Date Taking? Authorizing Provider  acetaminophen (TYLENOL) 500 MG tablet Take 500 mg by  mouth every 6 (six) hours as needed for mild pain or moderate pain.   Yes Historical Provider, MD  levonorgestrel (MIRENA) 20 MCG/24HR IUD 1 each by Intrauterine route once.   Yes Historical Provider, MD  Multiple Vitamins-Calcium (ONE-A-DAY WOMENS PO) Take 1 tablet by mouth daily.   Yes Historical Provider, MD  sulfamethoxazole-trimethoprim (BACTRIM DS,SEPTRA DS) 800-160 MG tablet Take 1 tablet by mouth 2 (two) times daily. Patient not taking: Reported on 08/17/2016 08/15/16 08/22/16  Fransico Meadow, PA-C    Family History Family History  Problem Relation Age of Onset  . Cirrhosis Mother   . Diabetes Mother   . Cancer Maternal Grandfather   .  Hypertension Sister   . Diabetes Sister   . Anesthesia problems Neg Hx   . Hypotension Neg Hx   . Malignant hyperthermia Neg Hx   . Pseudochol deficiency Neg Hx     Social History Social History  Substance Use Topics  . Smoking status: Never Smoker  . Smokeless tobacco: Never Used  . Alcohol use No     Allergies   Shellfish allergy   Review of Systems Review of Systems  All other systems reviewed and are negative.    Physical Exam Updated Vital Signs BP 115/63 (BP Location: Left Arm)   Pulse 83   Temp 98 F (36.7 C) (Oral)   Resp 20   Ht 5\' 2"  (1.575 m)   Wt 275 lb (124.7 kg)   SpO2 97%   BMI 50.30 kg/m   Physical Exam  Constitutional: She appears well-developed and well-nourished. No distress.  HENT:  Head: Normocephalic and atraumatic.  Mouth/Throat: Oropharynx is clear and moist. No oropharyngeal exudate.  Eyes: Conjunctivae and EOM are normal. Pupils are equal, round, and reactive to light. Right eye exhibits no discharge. Left eye exhibits no discharge. No scleral icterus.  Neck: Normal range of motion. Neck supple. No JVD present. No thyromegaly present.  Cardiovascular: Normal rate, regular rhythm, normal heart sounds and intact distal pulses.  Exam reveals no gallop and no friction rub.   No murmur heard. Pulmonary/Chest: Effort normal and breath sounds normal. No respiratory distress. She has no wheezes. She has no rales.  Abdominal: Soft. Bowel sounds are normal. She exhibits no distension and no mass. There is no tenderness.  Musculoskeletal: Normal range of motion. She exhibits no edema or tenderness.  Lymphadenopathy:    She has no cervical adenopathy.  Neurological: She is alert. Coordination normal.  Normal speech, normal memory, follows commands without difficulty including lifting bilateral lower extremities, normal hip flexors, normal knee flexion and extension, normal ankle dorsiflexion and plantarflexion against resistance. She has normal  sensation to all 4 tremors. She is able to lift up both of her arms with normal strength and when I ask her to show me her fingernails, when I ask her to show me her hands in different positions she has totally normal function and strength however when I asked to test her grip strength on both sides she does not make any effort with her left hand. There is no drift, she denies any sensory differences at her cranial nerves III through XII are totally normal. She denies any visual acuity changes. Her speech is clear and goal-directed  Skin: Skin is warm and dry. No rash noted. No erythema.  Psychiatric: She has a normal mood and affect. Her behavior is normal.  Nursing note and vitals reviewed.    ED Treatments / Results  Labs (all labs ordered are listed,  but only abnormal results are displayed) Labs Reviewed  URINALYSIS, ROUTINE W REFLEX MICROSCOPIC - Abnormal; Notable for the following:       Result Value   Hgb urine dipstick MODERATE (*)    Squamous Epithelial / LPF 0-5 (*)    All other components within normal limits  BASIC METABOLIC PANEL  CBC  CBG MONITORING, ED    EKG  EKG Interpretation  Date/Time:  Tuesday August 17 2016 14:05:05 EDT Ventricular Rate:  81 PR Interval:  148 QRS Duration: 78 QT Interval:  372 QTC Calculation: 432 R Axis:   13 Text Interpretation:  Normal sinus rhythm Cannot rule out Anterior infarct , age undetermined Abnormal ECG since last tracing no significant change Confirmed by Sabra Heck  MD, Kalima Saylor ([id number]) on 08/17/2016 3:26:01 PM       Radiology No results found.  Procedures Procedures (including critical care time)  Medications Ordered in ED Medications  prochlorperazine (COMPAZINE) injection 10 mg (10 mg Intramuscular Given 08/17/16 1601)  ketorolac (TORADOL) injection 60 mg (60 mg Intramuscular Given 08/17/16 1601)     Initial Impression / Assessment and Plan / ED Course  I have reviewed the triage vital signs and the nursing  notes.  Pertinent labs & imaging results that were available during my care of the patient were reviewed by me and considered in my medical decision making (see chart for details).  The patient is well-appearing, she has a normal neurologic exam, her symptoms of feigning a weak left grip do not match with the rest of her neurologic exam and the symptoms having right hand tingling prior to this event happening suggest that this is more of anxiety driven, metabolic driven but not a neurologic focal problem. I do not think this is multiple sclerosis, I do not think this is a primary spinal cord pathology, the patient does report that she did not have very much to eat this morning, she has had a slight burning when she urinates, otherwise her exam is unremarkable. We'll pursue workup with urinalysis, labs, I do not think the need to repeat her CT scan of the brain is necessary at this time. This does not appear to be consistent with a stroke, hemorrhage or other intracranial pathology. Possibly, complicated migraine.  The pt improved with meds Ambulated without difficulty and without assistance She has a normal urinalysis without infection No anemia No electrolyte disturbance, Meds given and improved - now using the L hand for everything including the remote for the television noted when I walk in the room. Pt informed of all results and need for close follow up Agreeable. Source of syncope not readily detectable but does not appear to be neurological or cardiogenic.  Final Clinical Impressions(s) / ED Diagnoses   Final diagnoses:  Acute nonintractable headache, unspecified headache type  Hand weakness  Near syncope    New Prescriptions Current Discharge Medication List       Noemi Chapel, MD 08/17/16 1755

## 2016-08-17 NOTE — ED Triage Notes (Addendum)
Pt states she was in the [employer] and had 2 syncopal episodes. Per pts sister brought her to ED. Pt reports that she has had several episodes of syncope in the past with arm numbness. Pt feels like she is confused, dizzy and left side feels numb. Reports chest pain and neck pain. Pt reports the last time she passed out had the same symptoms occurred. Pt reports that's her left side feels numb . Noted to have weak grip strength to left side. Facial features symmetrical. No arm drift. Spoke with Dr Reather Converse. No code stroke. CBG in triage 93

## 2016-08-17 NOTE — ED Notes (Signed)
ED Provider at bedside. 

## 2016-08-17 NOTE — Discharge Instructions (Signed)
Drink plenty of fluids - ibuprofen as needed for pain Your testing shows no signs of infection Your blood work was unremarkable,. Please return to the ER for worsening symptoms Drink plenty of fluids See your doctor in 2-3 days for recheck.

## 2016-11-06 ENCOUNTER — Emergency Department (HOSPITAL_COMMUNITY)
Admission: EM | Admit: 2016-11-06 | Discharge: 2016-11-06 | Disposition: A | Discharge status: Home / Self Care | Payer: Self-pay | Attending: Emergency Medicine | Admitting: Emergency Medicine

## 2016-11-06 ENCOUNTER — Encounter (HOSPITAL_COMMUNITY): Payer: Self-pay | Admitting: *Deleted

## 2016-11-06 ENCOUNTER — Emergency Department (HOSPITAL_COMMUNITY): Payer: Self-pay

## 2016-11-06 DIAGNOSIS — B9689 Other specified bacterial agents as the cause of diseases classified elsewhere: Secondary | ICD-10-CM

## 2016-11-06 DIAGNOSIS — R0789 Other chest pain: Secondary | ICD-10-CM | POA: Insufficient documentation

## 2016-11-06 DIAGNOSIS — N760 Acute vaginitis: Secondary | ICD-10-CM | POA: Insufficient documentation

## 2016-11-06 DIAGNOSIS — R102 Pelvic and perineal pain: Secondary | ICD-10-CM | POA: Insufficient documentation

## 2016-11-06 LAB — URINALYSIS, ROUTINE W REFLEX MICROSCOPIC
Bilirubin Urine: NEGATIVE
GLUCOSE, UA: NEGATIVE mg/dL
Ketones, ur: NEGATIVE mg/dL
NITRITE: NEGATIVE
PROTEIN: NEGATIVE mg/dL
SPECIFIC GRAVITY, URINE: 1.025 (ref 1.005–1.030)
pH: 5 (ref 5.0–8.0)

## 2016-11-06 LAB — CBC
HCT: 37.6 % (ref 36.0–46.0)
Hemoglobin: 12.4 g/dL (ref 12.0–15.0)
MCH: 27.6 pg (ref 26.0–34.0)
MCHC: 33 g/dL (ref 30.0–36.0)
MCV: 83.7 fL (ref 78.0–100.0)
PLATELETS: 276 10*3/uL (ref 150–400)
RBC: 4.49 MIL/uL (ref 3.87–5.11)
RDW: 13.8 % (ref 11.5–15.5)
WBC: 6 10*3/uL (ref 4.0–10.5)

## 2016-11-06 LAB — WET PREP, GENITAL
Sperm: NONE SEEN
TRICH WET PREP: NONE SEEN
Yeast Wet Prep HPF POC: NONE SEEN

## 2016-11-06 LAB — DIFFERENTIAL
Basophils Absolute: 0 10*3/uL (ref 0.0–0.1)
Basophils Relative: 0 %
Eosinophils Absolute: 0.1 10*3/uL (ref 0.0–0.7)
Eosinophils Relative: 2 %
LYMPHS ABS: 2.8 10*3/uL (ref 0.7–4.0)
LYMPHS PCT: 46 %
MONO ABS: 0.3 10*3/uL (ref 0.1–1.0)
Monocytes Relative: 5 %
Neutro Abs: 2.8 10*3/uL (ref 1.7–7.7)
Neutrophils Relative %: 47 %

## 2016-11-06 LAB — BASIC METABOLIC PANEL
Anion gap: 10 (ref 5–15)
BUN: 11 mg/dL (ref 6–20)
CALCIUM: 9.5 mg/dL (ref 8.9–10.3)
CO2: 25 mmol/L (ref 22–32)
CREATININE: 0.68 mg/dL (ref 0.44–1.00)
Chloride: 107 mmol/L (ref 101–111)
GFR calc Af Amer: >60 mL/min (ref >60)
GLUCOSE: 102 mg/dL — AB (ref 65–99)
POTASSIUM: 4.1 mmol/L (ref 3.5–5.1)
SODIUM: 142 mmol/L (ref 135–145)

## 2016-11-06 LAB — PREGNANCY, URINE: PREG TEST UR: NEGATIVE

## 2016-11-06 LAB — TROPONIN I

## 2016-11-06 LAB — D-DIMER, QUANTITATIVE (NOT AT ARMC): D DIMER QUANT: 0.36 ug{FEU}/mL (ref 0.00–0.50)

## 2016-11-06 MED ORDER — METRONIDAZOLE 500 MG PO TABS: 500.0000 mg | ORAL_TABLET | Freq: Two times a day (BID) | ORAL | 0 refills | Status: DC

## 2016-11-06 MED ORDER — KETOROLAC TROMETHAMINE 30 MG/ML IJ SOLN
30.0000 mg | Freq: Once | INTRAMUSCULAR | Status: AC
  Administered 2016-11-06: 30 mg via INTRAMUSCULAR
  Filled 2016-11-06: qty 1

## 2016-11-06 NOTE — ED Notes (Signed)
Pelvic is set up in room

## 2016-11-06 NOTE — ED Notes (Signed)
Patient transported to Ultrasound 

## 2016-11-06 NOTE — ED Triage Notes (Signed)
Pt comes in with chest pain and lower abdominal pain that started yesterday. Pt describes pain as a tightness in her chest. NAD noted.

## 2016-11-06 NOTE — ED Provider Notes (Signed)
[place] without torsion or other worrisome findigns - pt updated Stable for d/c.   Noemi Chapel, MD 11/06/16 2325

## 2016-11-06 NOTE — Discharge Instructions (Signed)
Your chest pain work-up is reassuring. There does not appear to be serious cause of your pain. Could be musculoskeletal pain given it is worse with movement. Take ibuprofen as needed for pain.  Your pelvic exam shows BV. Please take antibiotics as prescribed.  Return without fail for worsening symptoms, including fever, intractable vomiting, difficulty breathing, escalating pain, or any other symptoms concerning to you.

## 2016-11-06 NOTE — ED Provider Notes (Signed)
[place] DEPT Provider Note   CSN: [id number] Arrival date & time: 11/06/16  1808     History   Chief Complaint Chief Complaint  Patient presents with  . Chest Pain    HPI Jessica Sanchez is a 31 y.o. female.  HPI 31 year old female who presents with chest pain and pelvic pain. She has a history of uterine fibroids status post resection, and menorrhagia. States an onset of chest pain and shortness of breath yesterday while she was at a church activity. States that pain is sharp in nature, worse when she takes a deep breath in, and associated with shortness of breath with certain activities. Denies any fall, injury, heavy lifting or exertion. No prior history of tobacco use, asthma or COPD. History of IUD and no exogenous hormones. No recent immobilization. Minimal nonproductive cough recently but no sputum, congestion, sore throat or runny nose. No nausea or vomiting or diarrhea. Does state recently noticed thick vaginal discharge that is unusual for her. No abnormal vaginal bleeding. No prior history of ovarian cysts. States that yesterday much later after development of chest pain or shortness breath she also had sudden onset of sharp pelvic pain worse on the right side. She is not taking any medications for her symptoms.   Past Medical History:  Diagnosis Date  . Fibroid (bleeding) (uterine)   . Fibroid, uterine   . H/O metrorrhagia   . IUD (intrauterine device) in place 07/11/2015  . Menorrhagia     Patient Active Problem List   Diagnosis Date Noted  . IUD (intrauterine device) in place 07/11/2015  . Other disorder of menstruation and other abnormal bleeding from female genital tract 10/30/2012    Past Surgical History:  Procedure Laterality Date  . HYSTEROSCOPY W/D&C N/A 09/27/2013   Procedure: DILATATION AND CURETTAGE /HYSTEROSCOPY and insertion of Mirena IUD ;  Surgeon: [name]. Harrington Challenger, MD;  Location: [facility];  Service: Gynecology;  Laterality: N/A;  . MYOMECTOMY   02/03/2011   Procedure: MYOMECTOMY;  Surgeon: [name], MD;  Location: [facility];  Service: Gynecology;  Laterality: N/A;  . TONSILLECTOMY      OB History    Gravida Para Term Preterm AB Living   1       1     SAB TAB Ectopic Multiple Live Births   1               Home Medications    Prior to Admission medications   Medication Sig Start Date End Date Taking? Authorizing Provider  levonorgestrel (MIRENA) 20 MCG/24HR IUD 1 each by Intrauterine route once.   Yes [provider]  Multiple Vitamins-Calcium (ONE-A-DAY WOMENS PO) Take 1 tablet by mouth daily.   Yes [provider]  metroNIDAZOLE (FLAGYL) 500 MG tablet Take 1 tablet (500 mg total) by mouth 2 (two) times daily. 11/06/16   [name], MD    Family History Family History  Problem Relation Age of Onset  . Cirrhosis Mother   . Diabetes Mother   . Cancer Maternal Grandfather   . Hypertension Sister   . Diabetes Sister   . Anesthesia problems Neg Hx   . Hypotension Neg Hx   . Malignant hyperthermia Neg Hx   . Pseudochol deficiency Neg Hx     Social History Social History  Substance Use Topics  . Smoking status: Never Smoker  . Smokeless tobacco: Never Used  . Alcohol use No     Allergies   Shellfish allergy  Review of Systems Review of Systems  Constitutional: Negative for fever.  HENT: Negative for congestion.   Respiratory: Positive for shortness of breath.   Cardiovascular: Positive for chest pain.  Gastrointestinal: Negative for abdominal pain, diarrhea and vomiting.  Genitourinary: Positive for vaginal discharge. Negative for dysuria, frequency and vaginal bleeding.  Allergic/Immunologic: Negative for immunocompromised state.  Hematological: Does not bruise/bleed easily.  All other systems reviewed and are negative.    Physical Exam Updated Vital Signs BP (!) 113/53   Pulse 83   Temp 97.9 F (36.6 C) (Oral)   Resp 19   Ht 5\' 1"  (1.549 m)   Wt 120.2 kg (265 lb)   SpO2  98%   BMI 50.07 kg/m   Physical Exam Physical Exam  Nursing note and vitals reviewed. Constitutional: Well developed, well nourished, non-toxic, and in no acute distress Head: Normocephalic and atraumatic.  Mouth/Throat: Oropharynx is clear and moist.  Neck: Normal range of motion. Neck supple.  Cardiovascular: Normal rate and regular rhythm.   no chest wall tenderness Pulmonary/Chest: Effort normal and breath sounds normal.  Abdominal: Soft. There is no tenderness at McBurney's point. There is no rebound and no guarding.  right adnexal tenderness noted and suprapubic tenderness.  Musculoskeletal: Normal range of motion.  no lower extremity edema or calf tenderness  Neurological: Alert, no facial droop, fluent speech, moves all extremities symmetrically Skin: Skin is warm and dry.  Psychiatric: Cooperative Pelvic: Normal external genitalia. Normal internal genitalia. No discharge. No blood within the vagina. No cervical motion tenderness. No adnexal masses. Right adnexal tenderness    ED Treatments / Results  Labs (all labs ordered are listed, but only abnormal results are displayed) Labs Reviewed  WET PREP, GENITAL - Abnormal; Notable for the following:       Result Value   Clue Cells Wet Prep HPF POC PRESENT (*)    WBC, Wet Prep HPF POC MODERATE (*)    All other components within normal limits  BASIC METABOLIC PANEL - Abnormal; Notable for the following:    Glucose, Bld 102 (*)    All other components within normal limits  URINALYSIS, ROUTINE W REFLEX MICROSCOPIC - Abnormal; Notable for the following:    APPearance HAZY (*)    Hgb urine dipstick MODERATE (*)    Leukocytes, UA TRACE (*)    Bacteria, UA RARE (*)    Squamous Epithelial / LPF TOO NUMEROUS TO COUNT (*)    All other components within normal limits  CBC  TROPONIN I  DIFFERENTIAL  PREGNANCY, URINE  D-DIMER, QUANTITATIVE (NOT AT [facility])  GC/CHLAMYDIA PROBE AMP ([place]) NOT AT [facility]    EKG  EKG  Interpretation  Date/Time:  Saturday November 06 2016 18:14:04 EDT Ventricular Rate:  84 PR Interval:  142 QRS Duration: 80 QT Interval:  354 QTC Calculation: 418 R Axis:     Text Interpretation:  Normal sinus rhythm Nonspecific T wave abnormality Abnormal ECG Since last tracing T wave abnormality persists over time Confirmed by Noemi Chapel [phone]) on 11/06/2016 6:45:40 PM       Radiology Dg Chest 2 View  Result Date: 11/06/2016 CLINICAL DATA:  Chest pain.  Lower abdominal pain. EXAM: CHEST  2 VIEW COMPARISON:  Radiographs 08/09/2016 FINDINGS: The cardiomediastinal contours are normal. The lungs are clear. Pulmonary vasculature is normal. No consolidation, pleural effusion, or pneumothorax. No acute osseous abnormalities are seen. IMPRESSION: No acute abnormality. Electronically Signed   By: Jeb Levering M.D.   On: 11/06/2016 19:02  US Transvaginal Non-ob  Result Date: 11/06/2016 CLINICAL DATA:  Initial evaluation for acute lower abdominal pain for 1 day. EXAM: TRANSABDOMINAL AND TRANSVAGINAL ULTRASOUND OF PELVIS DOPPLER ULTRASOUND OF OVARIES TECHNIQUE: Both transabdominal and transvaginal ultrasound examinations of the pelvis were performed. Transabdominal technique was performed for global imaging of the pelvis including uterus, ovaries, adnexal regions, and pelvic cul-de-sac. It was necessary to proceed with endovaginal exam following the transabdominal exam to visualize the uterus and ovaries. Color and duplex Doppler ultrasound was utilized to evaluate blood flow to the ovaries. COMPARISON:  Prior CT from 09/09/2015. FINDINGS: Uterus Measurements: 7.5 x 4.9 x 5.0 cm. No fibroids or other mass visualized. Endometrium Thickness: 4 mm. No focal abnormality visualized. IUD in place within the endometrial canal. Right ovary Measurements: 3.2 x 1.7 x 1.4 cm. Normal appearance/no adnexal mass. Left ovary Measurements: 2.1 x 1.4 x 1.5 cm. Normal appearance/no adnexal mass. Pulsed Doppler evaluation  of both ovaries demonstrates normal low-resistance arterial and venous waveforms. Other findings No abnormal free fluid. IMPRESSION: 1. Normal pelvic ultrasound.  No acute abnormality identified. 2. IUD in appropriate position within the endometrial canal. Electronically Signed   By: Jeannine Boga M.D.   On: 11/06/2016 22:37   US Pelvis Complete  Result Date: 11/06/2016 CLINICAL DATA:  Initial evaluation for acute lower abdominal pain for 1 day. EXAM: TRANSABDOMINAL AND TRANSVAGINAL ULTRASOUND OF PELVIS DOPPLER ULTRASOUND OF OVARIES TECHNIQUE: Both transabdominal and transvaginal ultrasound examinations of the pelvis were performed. Transabdominal technique was performed for global imaging of the pelvis including uterus, ovaries, adnexal regions, and pelvic cul-de-sac. It was necessary to proceed with endovaginal exam following the transabdominal exam to visualize the uterus and ovaries. Color and duplex Doppler ultrasound was utilized to evaluate blood flow to the ovaries. COMPARISON:  Prior CT from 09/09/2015. FINDINGS: Uterus Measurements: 7.5 x 4.9 x 5.0 cm. No fibroids or other mass visualized. Endometrium Thickness: 4 mm. No focal abnormality visualized. IUD in place within the endometrial canal. Right ovary Measurements: 3.2 x 1.7 x 1.4 cm. Normal appearance/no adnexal mass. Left ovary Measurements: 2.1 x 1.4 x 1.5 cm. Normal appearance/no adnexal mass. Pulsed Doppler evaluation of both ovaries demonstrates normal low-resistance arterial and venous waveforms. Other findings No abnormal free fluid. IMPRESSION: 1. Normal pelvic ultrasound.  No acute abnormality identified. 2. IUD in appropriate position within the endometrial canal. Electronically Signed   By: Jeannine Boga M.D.   On: 11/06/2016 22:37   [place] Art/ven Flow Abd Pelv Doppler  Result Date: 11/06/2016 CLINICAL DATA:  Initial evaluation for acute lower abdominal pain for 1 day. EXAM: TRANSABDOMINAL AND TRANSVAGINAL ULTRASOUND OF  PELVIS DOPPLER ULTRASOUND OF OVARIES TECHNIQUE: Both transabdominal and transvaginal ultrasound examinations of the pelvis were performed. Transabdominal technique was performed for global imaging of the pelvis including uterus, ovaries, adnexal regions, and pelvic cul-de-sac. It was necessary to proceed with endovaginal exam following the transabdominal exam to visualize the uterus and ovaries. Color and duplex Doppler ultrasound was utilized to evaluate blood flow to the ovaries. COMPARISON:  Prior CT from 09/09/2015. FINDINGS: Uterus Measurements: 7.5 x 4.9 x 5.0 cm. No fibroids or other mass visualized. Endometrium Thickness: 4 mm. No focal abnormality visualized. IUD in place within the endometrial canal. Right ovary Measurements: 3.2 x 1.7 x 1.4 cm. Normal appearance/no adnexal mass. Left ovary Measurements: 2.1 x 1.4 x 1.5 cm. Normal appearance/no adnexal mass. Pulsed Doppler evaluation of both ovaries demonstrates normal low-resistance arterial and venous waveforms. Other findings No abnormal free fluid. IMPRESSION: 1. Normal  pelvic ultrasound.  No acute abnormality identified. 2. IUD in appropriate position within the endometrial canal. Electronically Signed   By: Jeannine Boga M.D.   On: 11/06/2016 22:37    Procedures Procedures (including critical care time)  Medications Ordered in ED Medications  ketorolac (TORADOL) 30 MG/ML injection 30 mg (30 mg Intramuscular Given 11/06/16 2040)     Initial Impression / Assessment and Plan / ED Course  I have reviewed the triage vital signs and the nursing notes.  Pertinent labs & imaging results that were available during my care of the patient were reviewed by me and considered in my medical decision making (see chart for details).    Presenting with atypical chest pain and pelvic pain. This does not seem to be related.   In regards to chest pain and dyspnea, she Is speaking in full sentences, with normal work of breathing, normal O2  saturation. Clear lungs on auscultation. Chest x-ray visualized without acute cardiopulmonary processes. D-dimer is negative and she is low risk for PE. Felt to be ruled out. EKG is nonischemic, and troponin 1 is normal. History is very atypical for ACS, and she is very low risk. Pain also seemed to be reproduced with certain movements, may be musculoskeletal in nature.  Abdomen overall soft and nonsurgical. No GI symptoms, but having some vaginal discharge. See chief concern for STDs and she has been sexually active in about a year. Pelvic exam primarily with suprapubic and right adnexal tenderness without appreciable mass. Will obtain US pelvis to rule out ovarian cyst, torsion. Wet prep positive for BV. Will treat with flagyl. Plan for discharge if no concerning findings on pelvic US. Signed out to Dr. Sabra Heck.  Final Clinical Impressions(s) / ED Diagnoses   Final diagnoses:  Pelvic pain  Bacterial vaginosis  Atypical chest pain    New Prescriptions Discharge Medication List as of 11/06/2016 11:25 PM    START taking these medications   Details  metroNIDAZOLE (FLAGYL) 500 MG tablet Take 1 tablet (500 mg total) by mouth 2 (two) times daily., Starting Sat 11/06/2016, Print         [name], MD 11/07/16 [phone]

## 2016-11-08 LAB — GC/CHLAMYDIA PROBE AMP (~~LOC~~) NOT AT ARMC
Chlamydia: NEGATIVE
Neisseria Gonorrhea: NEGATIVE

## 2016-11-16 ENCOUNTER — Emergency Department (HOSPITAL_COMMUNITY)
Admission: EM | Admit: 2016-11-16 | Discharge: 2016-11-16 | Disposition: A | Discharge status: Home / Self Care | Payer: Self-pay | Attending: Emergency Medicine | Admitting: Emergency Medicine

## 2016-11-16 ENCOUNTER — Encounter (HOSPITAL_COMMUNITY): Payer: Self-pay | Admitting: Emergency Medicine

## 2016-11-16 DIAGNOSIS — X509XXA Other and unspecified overexertion or strenuous movements or postures, initial encounter: Secondary | ICD-10-CM | POA: Insufficient documentation

## 2016-11-16 DIAGNOSIS — S76311A Strain of muscle, fascia and tendon of the posterior muscle group at thigh level, right thigh, initial encounter: Secondary | ICD-10-CM | POA: Insufficient documentation

## 2016-11-16 DIAGNOSIS — Y999 Unspecified external cause status: Secondary | ICD-10-CM | POA: Insufficient documentation

## 2016-11-16 DIAGNOSIS — T148XXA Other injury of unspecified body region, initial encounter: Secondary | ICD-10-CM

## 2016-11-16 DIAGNOSIS — Y929 Unspecified place or not applicable: Secondary | ICD-10-CM | POA: Insufficient documentation

## 2016-11-16 DIAGNOSIS — Y9389 Activity, other specified: Secondary | ICD-10-CM | POA: Insufficient documentation

## 2016-11-16 MED ORDER — CYCLOBENZAPRINE HCL 10 MG PO TABS: 10.0000 mg | ORAL_TABLET | Freq: Three times a day (TID) | ORAL | 0 refills | Status: DC | PRN

## 2016-11-16 MED ORDER — IBUPROFEN 600 MG PO TABS: 600.0000 mg | ORAL_TABLET | Freq: Four times a day (QID) | ORAL | 0 refills | Status: DC | PRN

## 2016-11-16 NOTE — ED Triage Notes (Signed)
Patient states she was standing on tip toes reaching for a box when she fell back twisting her right leg. States she did not fall all the way down. Complaining of pain to back of right leg since injury.

## 2016-11-16 NOTE — Discharge Instructions (Signed)
Apply ice packs on/off to your leg.  Minimal weight bearing for a few days.  Follow-up with your doctor for recheck if needed.

## 2016-11-16 NOTE — ED Provider Notes (Signed)
[place] DEPT Provider Note   CSN: [id number] Arrival date & time: 11/16/16  1446     History   Chief Complaint Chief Complaint  Patient presents with  . Leg Pain    HPI Jessica Sanchez is a 32 y.o. female.  HPI   Jessica Sanchez is a 31 y.o. female who presents to the Emergency Department complaining of pain to her Right thigh and right lower leg. States her pain began after a twisting injury while trying to reach upward to pick up a box. Denies fall, but states that she felt a sharp throbbing pain to her right leg. Injury occurred last evening. She reports pain and swelling to her leg with weightbearing. Pain improves at rest. She has not tried any home therapies. She denies numbness or tingling to her lower extremities.   Past Medical History:  Diagnosis Date  . Fibroid (bleeding) (uterine)   . Fibroid, uterine   . H/O metrorrhagia   . IUD (intrauterine device) in place 07/11/2015  . Menorrhagia     Patient Active Problem List   Diagnosis Date Noted  . IUD (intrauterine device) in place 07/11/2015  . Other disorder of menstruation and other abnormal bleeding from female genital tract 10/30/2012    Past Surgical History:  Procedure Laterality Date  . HYSTEROSCOPY W/D&C N/A 09/27/2013   Procedure: DILATATION AND CURETTAGE /HYSTEROSCOPY and insertion of Mirena IUD ;  Surgeon: [name]. Harrington Challenger, MD;  Location: [facility];  Service: Gynecology;  Laterality: N/A;  . MYOMECTOMY  02/03/2011   Procedure: MYOMECTOMY;  Surgeon: [name], MD;  Location: [facility];  Service: Gynecology;  Laterality: N/A;  . TONSILLECTOMY      OB History    Gravida Para Term Preterm AB Living   1       1     SAB TAB Ectopic Multiple Live Births   1               Home Medications    Prior to Admission medications   Medication Sig Start Date End Date Taking? Authorizing Provider  cyclobenzaprine (FLEXERIL) 10 MG tablet Take 1 tablet (10 mg total) by mouth 3 (three) times daily as needed.  11/16/16   Amiley Shishido, PA-C  ibuprofen (ADVIL,MOTRIN) 600 MG tablet Take 1 tablet (600 mg total) by mouth every 6 (six) hours as needed. Take with food 11/16/16   Kurstin Dimarzo, PA-C  levonorgestrel (MIRENA) 20 MCG/24HR IUD 1 each by Intrauterine route once.    [provider]  metroNIDAZOLE (FLAGYL) 500 MG tablet Take 1 tablet (500 mg total) by mouth 2 (two) times daily. 11/06/16   [name], MD  Multiple Vitamins-Calcium (ONE-A-DAY WOMENS PO) Take 1 tablet by mouth daily.    [provider]    Family History Family History  Problem Relation Age of Onset  . Cirrhosis Mother   . Diabetes Mother   . Cancer Maternal Grandfather   . Hypertension Sister   . Diabetes Sister   . Anesthesia problems Neg Hx   . Hypotension Neg Hx   . Malignant hyperthermia Neg Hx   . Pseudochol deficiency Neg Hx     Social History Social History  Substance Use Topics  . Smoking status: Never Smoker  . Smokeless tobacco: Never Used  . Alcohol use No     Allergies   Shellfish allergy   Review of Systems Review of Systems  Constitutional: Negative for chills and fever.  Gastrointestinal: Negative for abdominal pain,  nausea and vomiting.  Musculoskeletal: Positive for myalgias (Right thigh and lower leg pain). Negative for arthralgias, back pain, joint swelling and neck pain.  Skin: Negative for color change and wound.  Neurological: Negative for weakness and numbness.  All other systems reviewed and are negative.    Physical Exam Updated Vital Signs BP 119/75 (BP Location: Right Arm)   Pulse 71   Temp 98 F (36.7 C) (Oral)   Resp 18   Ht 5\' 2"  (1.575 m)   Wt 120.2 kg (265 lb)   SpO2 100%   BMI 48.47 kg/m   Physical Exam  Constitutional: She is oriented to person, place, and time. She appears well-developed and well-nourished. No distress.  HENT:  Head: Atraumatic.  Cardiovascular: Normal rate, regular rhythm and intact distal pulses.   Pulmonary/Chest:  Effort normal and breath sounds normal. No respiratory distress.  Musculoskeletal: Normal range of motion. She exhibits tenderness. She exhibits no edema or deformity.  Focal tenderness to palpation along the lateral aspect of the right calf and right posterior thigh. No obvious edema or discoloration.  Negative Thompson test.  Achilles tendon is nontender and appears intact  Neurological: She is alert and oriented to person, place, and time. No sensory deficit.  Skin: Skin is warm. Capillary refill takes less than 2 seconds. No rash noted. No erythema.  Psychiatric: She has a normal mood and affect.  Nursing note and vitals reviewed.    ED Treatments / Results  Labs (all labs ordered are listed, but only abnormal results are displayed) Labs Reviewed - No data to display  EKG  EKG Interpretation None       Radiology No results found.  Procedures Procedures (including critical care time)  Medications Ordered in ED Medications - No data to display   Initial Impression / Assessment and Plan / ED Course  I have reviewed the triage vital signs and the nursing notes.  Pertinent labs & imaging results that were available during my care of the patient were reviewed by me and considered in my medical decision making (see chart for details).     Patient well-appearing. Neurovascularly intact. Muscular tenderness without concerning symptoms for tear. Patient ambulates with steady gait. Agrees to ice, ibuprofen, rest and PCP follow-up if needed. Patient stable for discharge.  Final Clinical Impressions(s) / ED Diagnoses   Final diagnoses:  Muscle strain    New Prescriptions Discharge Medication List as of 11/16/2016  4:33 PM    START taking these medications   Details  cyclobenzaprine (FLEXERIL) 10 MG tablet Take 1 tablet (10 mg total) by mouth 3 (three) times daily as needed., Starting Tue 11/16/2016, Print    ibuprofen (ADVIL,MOTRIN) 600 MG tablet Take 1 tablet (600 mg  total) by mouth every 6 (six) hours as needed. Take with food, Starting Tue 11/16/2016, Print         [place], [place], PA-C 11/16/16 1716    Daleen Bo, MD 11/16/16 2351

## 2016-12-04 ENCOUNTER — Emergency Department (HOSPITAL_COMMUNITY)
Admission: EM | Admit: 2016-12-04 | Discharge: 2016-12-04 | Disposition: A | Discharge status: Home / Self Care | Payer: Self-pay | Attending: Emergency Medicine | Admitting: Emergency Medicine

## 2016-12-04 ENCOUNTER — Encounter (HOSPITAL_COMMUNITY): Payer: Self-pay | Admitting: *Deleted

## 2016-12-04 DIAGNOSIS — G44209 Tension-type headache, unspecified, not intractable: Secondary | ICD-10-CM | POA: Insufficient documentation

## 2016-12-04 DIAGNOSIS — Z79899 Other long term (current) drug therapy: Secondary | ICD-10-CM | POA: Insufficient documentation

## 2016-12-04 DIAGNOSIS — R112 Nausea with vomiting, unspecified: Secondary | ICD-10-CM | POA: Insufficient documentation

## 2016-12-04 DIAGNOSIS — R1032 Left lower quadrant pain: Secondary | ICD-10-CM | POA: Insufficient documentation

## 2016-12-04 DIAGNOSIS — R197 Diarrhea, unspecified: Secondary | ICD-10-CM | POA: Insufficient documentation

## 2016-12-04 LAB — CBC WITH DIFFERENTIAL/PLATELET
BASOS PCT: 0 %
Basophils Absolute: 0 10*3/uL (ref 0.0–0.1)
EOS ABS: 0.1 10*3/uL (ref 0.0–0.7)
EOS PCT: 2 %
HCT: 38.5 % (ref 36.0–46.0)
Hemoglobin: 12.5 g/dL (ref 12.0–15.0)
Lymphocytes Relative: 49 %
Lymphs Abs: 3.2 10*3/uL (ref 0.7–4.0)
MCH: 27.5 pg (ref 26.0–34.0)
MCHC: 32.5 g/dL (ref 30.0–36.0)
MCV: 84.6 fL (ref 78.0–100.0)
MONO ABS: 0.4 10*3/uL (ref 0.1–1.0)
MONOS PCT: 6 %
Neutro Abs: 2.7 10*3/uL (ref 1.7–7.7)
Neutrophils Relative %: 43 %
PLATELETS: 301 10*3/uL (ref 150–400)
RBC: 4.55 MIL/uL (ref 3.87–5.11)
RDW: 14.2 % (ref 11.5–15.5)
WBC: 6.4 10*3/uL (ref 4.0–10.5)

## 2016-12-04 LAB — COMPREHENSIVE METABOLIC PANEL
ALBUMIN: 3.7 g/dL (ref 3.5–5.0)
ALK PHOS: 63 U/L (ref 38–126)
ALT: 15 U/L (ref 14–54)
AST: 14 U/L — AB (ref 15–41)
Anion gap: 7 (ref 5–15)
BILIRUBIN TOTAL: 0.5 mg/dL (ref 0.3–1.2)
BUN: 11 mg/dL (ref 6–20)
CALCIUM: 8.6 mg/dL — AB (ref 8.9–10.3)
CO2: 25 mmol/L (ref 22–32)
Chloride: 103 mmol/L (ref 101–111)
Creatinine, Ser: 0.65 mg/dL (ref 0.44–1.00)
GFR calc Af Amer: >60 mL/min (ref >60)
GLUCOSE: 92 mg/dL (ref 65–99)
Potassium: 4.3 mmol/L (ref 3.5–5.1)
Sodium: 135 mmol/L (ref 135–145)
TOTAL PROTEIN: 7 g/dL (ref 6.5–8.1)

## 2016-12-04 LAB — URINALYSIS, ROUTINE W REFLEX MICROSCOPIC
Bilirubin Urine: NEGATIVE
Glucose, UA: NEGATIVE mg/dL
Ketones, ur: NEGATIVE mg/dL
Leukocytes, UA: NEGATIVE
NITRITE: NEGATIVE
PH: 5 (ref 5.0–8.0)
Protein, ur: NEGATIVE mg/dL
SPECIFIC GRAVITY, URINE: 1.025 (ref 1.005–1.030)

## 2016-12-04 LAB — PREGNANCY, URINE: PREG TEST UR: NEGATIVE

## 2016-12-04 LAB — LIPASE, BLOOD: LIPASE: 22 U/L (ref 11–51)

## 2016-12-04 MED ORDER — KETOROLAC TROMETHAMINE 30 MG/ML IJ SOLN
30.0000 mg | Freq: Once | INTRAMUSCULAR | Status: AC
  Administered 2016-12-04: 30 mg via INTRAVENOUS
  Filled 2016-12-04: qty 1

## 2016-12-04 MED ORDER — METOCLOPRAMIDE HCL 5 MG/ML IJ SOLN
10.0000 mg | Freq: Once | INTRAMUSCULAR | Status: AC
  Administered 2016-12-04: 10 mg via INTRAVENOUS
  Filled 2016-12-04: qty 2

## 2016-12-04 MED ORDER — DIPHENHYDRAMINE HCL 25 MG PO CAPS
25.0000 mg | ORAL_CAPSULE | Freq: Once | ORAL | Status: AC
  Administered 2016-12-04: 25 mg via ORAL
  Filled 2016-12-04: qty 1

## 2016-12-04 NOTE — ED Triage Notes (Signed)
Pt c/o n/v/d with left lower abd pain that started 3 days ago also c/o headache and stiff neck that started yesterday,

## 2016-12-04 NOTE — Discharge Instructions (Signed)
Bland diet as tolerated.  Follow-up with [facility].  Return here if your symptoms become worse

## 2016-12-04 NOTE — ED Notes (Signed)
Pt states she is feeling a little bit better. States, "I just need to go home and get a good night's sleep" Asked friend to pick her up a burger and chicken sandwich.

## 2016-12-04 NOTE — ED Provider Notes (Signed)
[facility] DEPT Provider Note   CSN: [id number] Arrival date & time: 12/04/16  1937     History   Chief Complaint Chief Complaint  Patient presents with  . Abdominal Pain    HPI Jessica Sanchez is a 31 y.o. female.  HPI  Jessica Sanchez is a 31 y.o. female who presents to the Emergency Department complaining of headache for one day and left lower abdominal pain for 3-4 days.  She describes the abdominal pain as intermittent and cramping.  Intermittent nausea, vomiting and loose stools since onset.  Headache described as throbbing to her forehead and left temple.  Headache has been gradual in onset.  Also describes pain to left side of her neck.  She denies fever, chest pain, vaginal pain, abdnormal bleeding, dysuria.  She has been tolerating fluids.    Past Medical History:  Diagnosis Date  . Fibroid (bleeding) (uterine)   . Fibroid, uterine   . H/O metrorrhagia   . IUD (intrauterine device) in place 07/11/2015  . Menorrhagia     Patient Active Problem List   Diagnosis Date Noted  . IUD (intrauterine device) in place 07/11/2015  . Other disorder of menstruation and other abnormal bleeding from female genital tract 10/30/2012    Past Surgical History:  Procedure Laterality Date  . HYSTEROSCOPY W/D&C N/A 09/27/2013   Procedure: DILATATION AND CURETTAGE /HYSTEROSCOPY and insertion of Mirena IUD ;  Surgeon: [name]. Harrington Challenger, MD;  Location: [facility];  Service: Gynecology;  Laterality: N/A;  . MYOMECTOMY  02/03/2011   Procedure: MYOMECTOMY;  Surgeon: [name], MD;  Location: [facility];  Service: Gynecology;  Laterality: N/A;  . TONSILLECTOMY      OB History    Gravida Para Term Preterm AB Living   1       1     SAB TAB Ectopic Multiple Live Births   1               Home Medications    Prior to Admission medications   Medication Sig Start Date End Date Taking? Authorizing Provider  levonorgestrel (MIRENA) 20 MCG/24HR IUD 1 each by Intrauterine route once.   Yes [provider]  Multiple Vitamins-Calcium (ONE-A-DAY WOMENS PO) Take 1 tablet by mouth daily.   Yes [provider]  cyclobenzaprine (FLEXERIL) 10 MG tablet Take 1 tablet (10 mg total) by mouth 3 (three) times daily as needed. Patient not taking: Reported on 12/04/2016 11/16/16   [name], PA-C  ibuprofen (ADVIL,MOTRIN) 600 MG tablet Take 1 tablet (600 mg total) by mouth every 6 (six) hours as needed. Take with food Patient not taking: Reported on 12/04/2016 11/16/16   [name], PA-C  metroNIDAZOLE (FLAGYL) 500 MG tablet Take 1 tablet (500 mg total) by mouth 2 (two) times daily. Patient not taking: Reported on 12/04/2016 11/06/16   [name], MD    Family History Family History  Problem Relation Age of Onset  . Cirrhosis Mother   . Diabetes Mother   . Cancer Maternal Grandfather   . Hypertension Sister   . Diabetes Sister   . Anesthesia problems Neg Hx   . Hypotension Neg Hx   . Malignant hyperthermia Neg Hx   . Pseudochol deficiency Neg Hx     Social History Social History  Substance Use Topics  . Smoking status: Never Smoker  . Smokeless tobacco: Never Used  . Alcohol use No     Allergies   Shellfish allergy   Review  of Systems Review of Systems  Constitutional: Negative for activity change, appetite change and fever.  HENT: Negative for facial swelling and trouble swallowing.   Eyes: Positive for photophobia. Negative for pain and visual disturbance.  Respiratory: Negative for shortness of breath.   Cardiovascular: Negative for chest pain.  Gastrointestinal: Positive for abdominal pain. Negative for constipation, diarrhea, nausea and vomiting.  Genitourinary: Negative for flank pain.  Musculoskeletal: Positive for neck pain (left neck pain). Negative for back pain and neck stiffness.  Skin: Negative for rash and wound.  Neurological: Positive for headaches. Negative for dizziness, facial asymmetry, speech difficulty, weakness and numbness.    Psychiatric/Behavioral: Negative for confusion and decreased concentration.  All other systems reviewed and are negative.    Physical Exam Updated Vital Signs BP 123/71 (BP Location: Right Arm)   Pulse 85   Temp 98.1 F (36.7 C) (Oral)   Resp 16   Ht 5\' 1"  (1.549 m)   Wt 127 kg (280 lb)   SpO2 99%   BMI 52.91 kg/m   Physical Exam  Constitutional: She is oriented to person, place, and time. She appears well-developed and well-nourished. No distress.  HENT:  Head: Normocephalic and atraumatic.  Mouth/Throat: Oropharynx is clear and moist.  Eyes: Pupils are equal, round, and reactive to light. EOM are normal.  Neck: Normal range of motion and phonation normal. Neck supple. Muscular tenderness (ttp of left SCM muscle) present. No spinous process tenderness present. No neck rigidity. No Kernig's sign noted.  Cardiovascular: Normal rate, regular rhythm, normal heart sounds and intact distal pulses.   No murmur heard. Pulmonary/Chest: Effort normal and breath sounds normal. No respiratory distress.  Abdominal: Soft. She exhibits no distension and no mass. There is no tenderness. There is no guarding.  Musculoskeletal: Normal range of motion.  Neurological: She is alert and oriented to person, place, and time. She has normal strength. No cranial nerve deficit or sensory deficit. She exhibits normal muscle tone. Coordination and gait normal. GCS eye subscore is 4. GCS verbal subscore is 5. GCS motor subscore is 6.  Reflex Scores:      Tricep reflexes are 2+ on the right side and 2+ on the left side.      Bicep reflexes are 2+ on the right side and 2+ on the left side. Skin: Skin is warm and dry.  Psychiatric: She has a normal mood and affect.  Nursing note and vitals reviewed.    ED Treatments / Results  Labs (all labs ordered are listed, but only abnormal results are displayed) Labs Reviewed  COMPREHENSIVE METABOLIC PANEL - Abnormal; Notable for the following:       Result  Value   Calcium 8.6 (*)    AST 14 (*)    All other components within normal limits  URINALYSIS, ROUTINE W REFLEX MICROSCOPIC - Abnormal; Notable for the following:    APPearance HAZY (*)    Hgb urine dipstick MODERATE (*)    Bacteria, UA RARE (*)    Squamous Epithelial / LPF 6-30 (*)    All other components within normal limits  LIPASE, BLOOD  CBC WITH DIFFERENTIAL/PLATELET  PREGNANCY, URINE    EKG  EKG Interpretation None       Radiology No results found.  Procedures Procedures (including critical care time)  Medications Ordered in ED Medications  ketorolac (TORADOL) 30 MG/ML injection 30 mg (30 mg Intravenous Given 12/04/16 2050)  metoCLOPramide (REGLAN) injection 10 mg (10 mg Intravenous Given 12/04/16 2054)  diphenhydrAMINE (BENADRYL)  capsule 25 mg (25 mg Oral Given 12/04/16 2041)     Initial Impression / Assessment and Plan / ED Course  I have reviewed the triage vital signs and the nursing notes.  Pertinent labs & imaging results that were available during my care of the patient were reviewed by me and considered in my medical decision making (see chart for details).      Pt well appearing.  Vitals stable.  Labs reassuring.  No focal neuro deficits, no meningeal signs.  abd is soft, NT on exam.  Doubt acute abdomen.  No vomiting or diarrhea during stay.   2130  I was informed by the patient's nurse that she was feeling better and had ordered [employer]  On recheck, she states that she is feeling better and ready for d/c.  Return precautions discussed.   Final Clinical Impressions(s) / ED Diagnoses   Final diagnoses:  Left lower quadrant pain  Acute non intractable tension-type headache    New Prescriptions New Prescriptions   No medications on file     Kem Parkinson, Hershal Coria 12/05/16 2251    Fredia Sorrow, MD 12/06/16 [phone]

## 2016-12-11 ENCOUNTER — Encounter (HOSPITAL_COMMUNITY): Payer: Self-pay | Admitting: Emergency Medicine

## 2016-12-11 ENCOUNTER — Emergency Department (HOSPITAL_COMMUNITY)
Admission: EM | Admit: 2016-12-11 | Discharge: 2016-12-11 | Disposition: A | Discharge status: Home / Self Care | Payer: Self-pay | Attending: Emergency Medicine | Admitting: Emergency Medicine

## 2016-12-11 DIAGNOSIS — Z79899 Other long term (current) drug therapy: Secondary | ICD-10-CM | POA: Insufficient documentation

## 2016-12-11 DIAGNOSIS — L0291 Cutaneous abscess, unspecified: Secondary | ICD-10-CM | POA: Insufficient documentation

## 2016-12-11 MED ORDER — SULFAMETHOXAZOLE-TRIMETHOPRIM 800-160 MG PO TABS: 1.0000 | ORAL_TABLET | Freq: Two times a day (BID) | ORAL | 0 refills | Status: AC

## 2016-12-11 MED ORDER — SULFAMETHOXAZOLE-TRIMETHOPRIM 800-160 MG PO TABS
1.0000 | ORAL_TABLET | Freq: Once | ORAL | Status: AC
  Administered 2016-12-11: 1 via ORAL
  Filled 2016-12-11: qty 1

## 2016-12-11 NOTE — ED Triage Notes (Signed)
Bump to rt side of forehead at hairline for over 3 weeks.  Denies any injury.  States at first it was just itchy but here recently it has been painful.  Also reports headache for last 3-4 days.

## 2016-12-11 NOTE — Discharge Instructions (Signed)
Follow up with your md in 3-4 days

## 2016-12-11 NOTE — ED Provider Notes (Signed)
[facility] Provider [facility]   CSN: [id number] Arrival date & time: 12/11/16  2012     History   Chief Complaint Chief Complaint  Patient presents with  . Abscess    HPI Jessica Sanchez is a 31 y.o. female.  Patient complains of swelling to her forehead which is tender.   The history is provided by the patient.  Abscess  Location:  Head/neck Abscess quality: not draining   Red streaking: no   Progression:  Worsening Chronicity:  New Context: not diabetes   Relieved by:  Nothing Worsened by:  Nothing Ineffective treatments:  None tried Associated symptoms: no fatigue and no headaches     Past Medical History:  Diagnosis Date  . Fibroid (bleeding) (uterine)   . Fibroid, uterine   . H/O metrorrhagia   . IUD (intrauterine device) in place 07/11/2015  . Menorrhagia     Patient Active Problem List   Diagnosis Date Noted  . IUD (intrauterine device) in place 07/11/2015  . Other disorder of menstruation and other abnormal bleeding from female genital tract 10/30/2012    Past Surgical History:  Procedure Laterality Date  . HYSTEROSCOPY W/D&C N/A 09/27/2013   Procedure: DILATATION AND CURETTAGE /HYSTEROSCOPY and insertion of Mirena IUD ;  Surgeon: [name]. Harrington Challenger, MD;  Location: [facility];  Service: Gynecology;  Laterality: N/A;  . MYOMECTOMY  02/03/2011   Procedure: MYOMECTOMY;  Surgeon: [name], MD;  Location: [facility];  Service: Gynecology;  Laterality: N/A;  . TONSILLECTOMY      OB History    Gravida Para Term Preterm AB Living   1       1     SAB TAB Ectopic Multiple Live Births   1               Home Medications    Prior to Admission medications   Medication Sig Start Date End Date Taking? Authorizing Provider  cyclobenzaprine (FLEXERIL) 10 MG tablet Take 1 tablet (10 mg total) by mouth 3 (three) times daily as needed. Patient not taking: Reported on 12/04/2016 11/16/16   Triplett, Tammy, PA-C  ibuprofen (ADVIL,MOTRIN) 600 MG tablet Take 1 tablet  (600 mg total) by mouth every 6 (six) hours as needed. Take with food Patient not taking: Reported on 12/04/2016 11/16/16   Kem Parkinson, PA-C  levonorgestrel (MIRENA) 20 MCG/24HR IUD 1 each by Intrauterine route once.    [provider]  metroNIDAZOLE (FLAGYL) 500 MG tablet Take 1 tablet (500 mg total) by mouth 2 (two) times daily. Patient not taking: Reported on 12/04/2016 11/06/16   [name], MD  Multiple Vitamins-Calcium (ONE-A-DAY WOMENS PO) Take 1 tablet by mouth daily.    [provider]  sulfamethoxazole-trimethoprim (BACTRIM DS,SEPTRA DS) 800-160 MG tablet Take 1 tablet by mouth 2 (two) times daily. 12/11/16 12/18/16  Milton Ferguson, MD    Family History Family History  Problem Relation Age of Onset  . Cirrhosis Mother   . Diabetes Mother   . Cancer Maternal Grandfather   . Hypertension Sister   . Diabetes Sister   . Anesthesia problems Neg Hx   . Hypotension Neg Hx   . Malignant hyperthermia Neg Hx   . Pseudochol deficiency Neg Hx     Social History Social History  Substance Use Topics  . Smoking status: Never Smoker  . Smokeless tobacco: Never Used  . Alcohol use No     Allergies   Shellfish allergy   Review of Systems Review of  Systems  Constitutional: Negative for appetite change and fatigue.  HENT: Negative for congestion, ear discharge and sinus pressure.        Tenderness to forehead  Eyes: Negative for discharge.  Respiratory: Negative for cough.   Cardiovascular: Negative for chest pain.  Gastrointestinal: Negative for abdominal pain and diarrhea.  Genitourinary: Negative for frequency and hematuria.  Musculoskeletal: Negative for back pain.  Skin: Negative for rash.  Neurological: Negative for seizures and headaches.  Psychiatric/Behavioral: Negative for hallucinations.     Physical Exam Updated Vital Signs BP 140/85 (BP Location: Right Arm)   Pulse 82   Temp 98.3 F (36.8 C) (Oral)   Resp 18   Ht 5\' 1"  (1.549 m)   Wt  127 kg (280 lb)   SpO2 97%   BMI 52.91 kg/m   Physical Exam  Constitutional: She is oriented to person, place, and time. She appears well-developed.  HENT:  Head: Normocephalic.  Patient has a half a centimeter tender area to her forehead. It appears to be infected. It is not ready to be drained yet  Eyes: Conjunctivae are normal.  Neck: No tracheal deviation present.  Cardiovascular:  No murmur heard. Musculoskeletal: Normal range of motion.  Neurological: She is oriented to person, place, and time.  Skin: Skin is warm.  Psychiatric: She has a normal mood and affect.     ED Treatments / Results  Labs (all labs ordered are listed, but only abnormal results are displayed) Labs Reviewed - No data to display  EKG  EKG Interpretation None       Radiology No results found.  Procedures Procedures (including critical care time)  Medications Ordered in ED Medications  sulfamethoxazole-trimethoprim (BACTRIM DS,SEPTRA DS) 800-160 MG per tablet 1 tablet (not administered)     Initial Impression / Assessment and Plan / ED Course  I have reviewed the triage vital signs and the nursing notes.  Pertinent labs & imaging results that were available during my care of the patient were reviewed by me and considered in my medical decision making (see chart for details).     Patient with skin infection for it. She'll be placed on Bactrim and follow-up with her doctor in 3-4 days  Final Clinical Impressions(s) / ED Diagnoses   Final diagnoses:  Abscess    New Prescriptions New Prescriptions   SULFAMETHOXAZOLE-TRIMETHOPRIM (BACTRIM DS,SEPTRA DS) 800-160 MG TABLET    Take 1 tablet by mouth 2 (two) times daily.     Milton Ferguson, MD 12/11/16 2036

## 2016-12-28 ENCOUNTER — Encounter (HOSPITAL_COMMUNITY): Payer: Self-pay | Admitting: Emergency Medicine

## 2016-12-28 ENCOUNTER — Emergency Department (HOSPITAL_COMMUNITY): Payer: Self-pay

## 2016-12-28 ENCOUNTER — Emergency Department (HOSPITAL_COMMUNITY)
Admission: EM | Admit: 2016-12-28 | Discharge: 2016-12-28 | Disposition: A | Discharge status: Home / Self Care | Payer: Self-pay | Attending: Emergency Medicine | Admitting: Emergency Medicine

## 2016-12-28 DIAGNOSIS — R0602 Shortness of breath: Secondary | ICD-10-CM | POA: Insufficient documentation

## 2016-12-28 DIAGNOSIS — H81392 Other peripheral vertigo, left ear: Secondary | ICD-10-CM | POA: Insufficient documentation

## 2016-12-28 DIAGNOSIS — R55 Syncope and collapse: Secondary | ICD-10-CM | POA: Insufficient documentation

## 2016-12-28 DIAGNOSIS — R0789 Other chest pain: Secondary | ICD-10-CM | POA: Insufficient documentation

## 2016-12-28 DIAGNOSIS — Z79899 Other long term (current) drug therapy: Secondary | ICD-10-CM | POA: Insufficient documentation

## 2016-12-28 DIAGNOSIS — R05 Cough: Secondary | ICD-10-CM | POA: Insufficient documentation

## 2016-12-28 LAB — URINALYSIS, ROUTINE W REFLEX MICROSCOPIC
BILIRUBIN URINE: NEGATIVE
Glucose, UA: NEGATIVE mg/dL
Ketones, ur: NEGATIVE mg/dL
LEUKOCYTES UA: NEGATIVE
NITRITE: NEGATIVE
PROTEIN: NEGATIVE mg/dL
Specific Gravity, Urine: 1.025 (ref 1.005–1.030)
pH: 6 (ref 5.0–8.0)

## 2016-12-28 LAB — COMPREHENSIVE METABOLIC PANEL
ALK PHOS: 62 U/L (ref 38–126)
ALT: 13 U/L — AB (ref 14–54)
AST: 13 U/L — AB (ref 15–41)
Albumin: 3.7 g/dL (ref 3.5–5.0)
Anion gap: 6 (ref 5–15)
BUN: 12 mg/dL (ref 6–20)
CALCIUM: 8.7 mg/dL — AB (ref 8.9–10.3)
CHLORIDE: 106 mmol/L (ref 101–111)
CO2: 26 mmol/L (ref 22–32)
CREATININE: 0.84 mg/dL (ref 0.44–1.00)
GFR calc Af Amer: >60 mL/min (ref >60)
Glucose, Bld: 100 mg/dL — ABNORMAL HIGH (ref 65–99)
Potassium: 4.1 mmol/L (ref 3.5–5.1)
Sodium: 138 mmol/L (ref 135–145)
Total Bilirubin: 0.4 mg/dL (ref 0.3–1.2)
Total Protein: 7 g/dL (ref 6.5–8.1)

## 2016-12-28 LAB — CBC WITH DIFFERENTIAL/PLATELET
BASOS ABS: 0 10*3/uL (ref 0.0–0.1)
Basophils Relative: 0 %
EOS PCT: 2 %
Eosinophils Absolute: 0.1 10*3/uL (ref 0.0–0.7)
HEMATOCRIT: 36.3 % (ref 36.0–46.0)
HEMOGLOBIN: 12 g/dL (ref 12.0–15.0)
LYMPHS ABS: 3.1 10*3/uL (ref 0.7–4.0)
LYMPHS PCT: 42 %
MCH: 28 pg (ref 26.0–34.0)
MCHC: 33.1 g/dL (ref 30.0–36.0)
MCV: 84.6 fL (ref 78.0–100.0)
Monocytes Absolute: 0.5 10*3/uL (ref 0.1–1.0)
Monocytes Relative: 7 %
NEUTROS ABS: 3.7 10*3/uL (ref 1.7–7.7)
NEUTROS PCT: 49 %
PLATELETS: 283 10*3/uL (ref 150–400)
RBC: 4.29 MIL/uL (ref 3.87–5.11)
RDW: 14.3 % (ref 11.5–15.5)
WBC: 7.5 10*3/uL (ref 4.0–10.5)

## 2016-12-28 LAB — LIPASE, BLOOD: Lipase: 24 U/L (ref 11–51)

## 2016-12-28 LAB — I-STAT TROPONIN, ED: TROPONIN I, POC: 0 ng/mL (ref 0.00–0.08)

## 2016-12-28 LAB — PREGNANCY, URINE: PREG TEST UR: NEGATIVE

## 2016-12-28 LAB — D-DIMER, QUANTITATIVE: D-Dimer, Quant: 0.6 ug/mL-FEU — ABNORMAL HIGH (ref 0.00–0.50)

## 2016-12-28 MED ORDER — IOPAMIDOL (ISOVUE-370) INJECTION 76%
100.0000 mL | Freq: Once | INTRAVENOUS | Status: AC | PRN
  Administered 2016-12-28: 100 mL via INTRAVENOUS

## 2016-12-28 MED ORDER — ALBUTEROL SULFATE HFA 108 (90 BASE) MCG/ACT IN AERS
1.0000 | INHALATION_SPRAY | RESPIRATORY_TRACT | Status: DC | PRN
  Administered 2016-12-28: 2 via RESPIRATORY_TRACT
  Filled 2016-12-28: qty 6.7

## 2016-12-28 MED ORDER — SODIUM CHLORIDE 0.9 % IV BOLUS (SEPSIS)
1000.0000 mL | Freq: Once | INTRAVENOUS | Status: AC
  Administered 2016-12-28: 1000 mL via INTRAVENOUS

## 2016-12-28 MED ORDER — CYCLOBENZAPRINE HCL 10 MG PO TABS: 10.0000 mg | ORAL_TABLET | Freq: Three times a day (TID) | ORAL | 0 refills | Status: DC | PRN

## 2016-12-28 MED ORDER — IBUPROFEN 600 MG PO TABS: 600.0000 mg | ORAL_TABLET | Freq: Four times a day (QID) | ORAL | 0 refills | Status: DC | PRN

## 2016-12-28 MED ORDER — MECLIZINE HCL 25 MG PO TABS: 25.0000 mg | ORAL_TABLET | Freq: Three times a day (TID) | ORAL | 0 refills | Status: DC | PRN

## 2016-12-28 MED ORDER — MECLIZINE HCL 12.5 MG PO TABS
25.0000 mg | ORAL_TABLET | Freq: Once | ORAL | Status: AC
  Administered 2016-12-28: 25 mg via ORAL
  Filled 2016-12-28: qty 2

## 2016-12-28 NOTE — ED Provider Notes (Signed)
[facility] DEPT Provider Note   CSN: [id number] Arrival date & time: 12/28/16  1530     History   Chief Complaint Chief Complaint  Patient presents with  . Chest Pain    HPI Jessica Sanchez is a 31 y.o. female.  HPI Patient has had several days of drinking in her left ear. No associated pain or difficulty hearing. She developed spinning sensation yesterday. This is worse with standing or turning her head. No associated nausea or vomiting. While at work today she stood up and had the spinning sensation again. She had collapse and questionable brief loss of consciousness. No head or neck injury. No focal weakness or numbness. She also endorses ongoing central chest pain. The pain has been present for several days. Is worse with movement or palpation. She's had some shortness of breath and wheezing which she attributes to being out of her inhaler. intermittent cough without sputum production. No recent extended immobilization or travel. Past Medical History:  Diagnosis Date  . Fibroid (bleeding) (uterine)   . Fibroid, uterine   . H/O metrorrhagia   . IUD (intrauterine device) in place 07/11/2015  . Menorrhagia     Patient Active Problem List   Diagnosis Date Noted  . IUD (intrauterine device) in place 07/11/2015  . Other disorder of menstruation and other abnormal bleeding from female genital tract 10/30/2012    Past Surgical History:  Procedure Laterality Date  . HYSTEROSCOPY W/D&C N/A 09/27/2013   Procedure: DILATATION AND CURETTAGE /HYSTEROSCOPY and insertion of Mirena IUD ;  Surgeon: [name]. Harrington Challenger, MD;  Location: [facility];  Service: Gynecology;  Laterality: N/A;  . MYOMECTOMY  02/03/2011   Procedure: MYOMECTOMY;  Surgeon: [name], MD;  Location: [facility];  Service: Gynecology;  Laterality: N/A;  . TONSILLECTOMY      OB History    Gravida Para Term Preterm AB Living   1       1     SAB TAB Ectopic Multiple Live Births   1               Home Medications     Prior to Admission medications   Medication Sig Start Date End Date Taking? Authorizing Provider  levonorgestrel (MIRENA) 20 MCG/24HR IUD 1 each by Intrauterine route once.   Yes [provider]  Multiple Vitamins-Calcium (ONE-A-DAY WOMENS PO) Take 1 tablet by mouth daily.   Yes [provider]  cyclobenzaprine (FLEXERIL) 10 MG tablet Take 1 tablet (10 mg total) by mouth 3 (three) times daily as needed for muscle spasms. 12/28/16   Julianne Rice, MD  ibuprofen (ADVIL,MOTRIN) 600 MG tablet Take 1 tablet (600 mg total) by mouth every 6 (six) hours as needed for moderate pain. Take with food 12/28/16   Julianne Rice, MD  meclizine (ANTIVERT) 25 MG tablet Take 1 tablet (25 mg total) by mouth 3 (three) times daily as needed for dizziness. 12/28/16   Julianne Rice, MD  metroNIDAZOLE (FLAGYL) 500 MG tablet Take 1 tablet (500 mg total) by mouth 2 (two) times daily. Patient not taking: Reported on 12/04/2016 11/06/16   [name], MD    Family History Family History  Problem Relation Age of Onset  . Cirrhosis Mother   . Diabetes Mother   . Cancer Maternal Grandfather   . Hypertension Sister   . Diabetes Sister   . Anesthesia problems Neg Hx   . Hypotension Neg Hx   . Malignant hyperthermia Neg Hx   . Pseudochol deficiency  Neg Hx     Social History Social History  Substance Use Topics  . Smoking status: Never Smoker  . Smokeless tobacco: Never Used  . Alcohol use No     Allergies   Shellfish allergy   Review of Systems Review of Systems  Constitutional: Negative for chills, fatigue and fever.  HENT: Positive for tinnitus. Negative for ear pain, hearing loss, sinus pressure and sore throat.   Eyes: Negative for visual disturbance.  Respiratory: Positive for cough, shortness of breath and wheezing.   Cardiovascular: Positive for chest pain. Negative for palpitations.  Gastrointestinal: Negative for abdominal pain, nausea and vomiting.  Genitourinary:  Negative for dysuria, flank pain and frequency.  Musculoskeletal: Negative for back pain, myalgias, neck pain and neck stiffness.  Skin: Negative for rash and wound.  Neurological: Positive for dizziness, syncope and light-headedness. Negative for weakness, numbness and headaches.  All other systems reviewed and are negative.    Physical Exam Updated Vital Signs BP 110/73   Pulse 91   Temp (!) 97.4 F (36.3 C) (Temporal)   Resp (!) 22   Ht 5\' 1"  (1.549 m)   Wt 127 kg (280 lb)   SpO2 100%   BMI 52.91 kg/m   Physical Exam  Constitutional: She is oriented to person, place, and time. She appears well-developed and well-nourished. No distress.  HENT:  Head: Normocephalic and atraumatic.  Mouth/Throat: Oropharynx is clear and moist. No oropharyngeal exudate.  Bilateral nasal edema. Left TM appears sclerosed. No sinus tenderness to percussion.  Eyes: Pupils are equal, round, and reactive to light. EOM are normal.  No appreciated nystagmus.  Neck: Normal range of motion. Neck supple.  Cardiovascular: Normal rate and regular rhythm.  Exam reveals no gallop and no friction rub.   No murmur heard. Pulmonary/Chest: Effort normal. No respiratory distress. She has wheezes. She has no rales.  Few scattered end expiratory wheezes. Patient has sternal and left parasternal tenderness to palpation. There is no crepitance or deformity.  Abdominal: Soft. Bowel sounds are normal. There is no tenderness. There is no rebound and no guarding.  Musculoskeletal: Normal range of motion. She exhibits no edema or tenderness.  No lower extremity asymmetry or tenderness. Distal pulses intact.  Lymphadenopathy:    She has no cervical adenopathy.  Neurological: She is alert and oriented to person, place, and time.  Patient is alert and oriented x3 with clear, goal oriented speech. Patient has 5/5 motor in all extremities. Sensation is intact to light touch. Bilateral finger-to-nose is normal with no signs of  dysmetria.   Skin: Skin is warm and dry. Capillary refill takes less than 2 seconds. No rash noted. No erythema.  Psychiatric: She has a normal mood and affect. Her behavior is normal.  Nursing note and vitals reviewed.    ED Treatments / Results  Labs (all labs ordered are listed, but only abnormal results are displayed) Labs Reviewed  COMPREHENSIVE METABOLIC PANEL - Abnormal; Notable for the following:       Result Value   Glucose, Bld 100 (*)    Calcium 8.7 (*)    AST 13 (*)    ALT 13 (*)    All other components within normal limits  URINALYSIS, ROUTINE W REFLEX MICROSCOPIC - Abnormal; Notable for the following:    APPearance CLOUDY (*)    Hgb urine dipstick MODERATE (*)    Bacteria, UA RARE (*)    Squamous Epithelial / LPF 6-30 (*)    All other components within normal  limits  D-DIMER, QUANTITATIVE (NOT AT [facility]) - Abnormal; Notable for the following:    D-Dimer, Quant 0.60 (*)    All other components within normal limits  CBC WITH DIFFERENTIAL/PLATELET  LIPASE, BLOOD  PREGNANCY, URINE  I-STAT TROPONIN, ED    EKG  EKG Interpretation None       Radiology Dg Chest 2 View  Result Date: 12/28/2016 CLINICAL DATA:  Lightheaded and weakness for 2 days with syncopal episode at work today. Chest tightness and shortness breath for 3 days. EXAM: CHEST  2 VIEW COMPARISON:  November 06, 2016 FINDINGS: The heart size and mediastinal contours are within normal limits. There is mild atelectasis of the left lung base. There is no focal infiltrate, pulmonary edema, or pleural effusion. The visualized skeletal structures are unremarkable. IMPRESSION: No active cardiopulmonary disease. Electronically Signed   By: Abelardo Diesel M.D.   On: 12/28/2016 18:00   Ct Angio Chest Pe W And/or Wo Contrast  Result Date: 12/28/2016 CLINICAL DATA:  31 y/o F; lightheaded and chest tightness. Elevated D-dimer. PT suspected, intermediate probability. EXAM: CT ANGIOGRAPHY CHEST WITH CONTRAST TECHNIQUE:  Multidetector CT imaging of the chest was performed using the standard protocol during bolus administration of intravenous contrast. Multiplanar CT image reconstructions and MIPs were obtained to evaluate the vascular anatomy. CONTRAST:  100 cc Isovue 370 COMPARISON:  12/28/2016 chest radiograph FINDINGS: Cardiovascular: Respiratory motion artifact. Satisfactory opacification of pulmonary artery. No central, lobar, or proximal segmental pulmonary embolus identified. Suboptimal assessment for sub segmental pulmonary emboli. Normal heart size. No pericardial effusion. Normal caliber thoracic aorta Mediastinum/Nodes: No enlarged mediastinal, hilar, or axillary lymph nodes. Thyroid gland, trachea, and esophagus demonstrate no significant findings. Lungs/Pleura: Faint ground-glass opacity at the peripheries of the lung bases probably representing atelectasis. No consolidation. No pleural effusion or pneumothorax. Upper Abdomen: No acute abnormality. Musculoskeletal: No chest wall abnormality. No acute or significant osseous findings. Review of the MIP images confirms the above findings. IMPRESSION: 1. Mild respiratory motion artifact. No central, lobar, or proximal segmental pulmonary embolus identified. Suboptimal assessment for subsegmental pulmonary emboli. 2. Faint ground-glass opacities at periphery of lung bases, probably atelectasis. No consolidation, effusion, or pneumothorax. Electronically Signed   By: Kristine Garbe M.D.   On: 12/28/2016 20:22    Procedures Procedures (including critical care time)  Medications Ordered in ED Medications  albuterol (PROVENTIL HFA;VENTOLIN HFA) 108 (90 Base) MCG/ACT inhaler 1-2 puff (2 puffs Inhalation Given 12/28/16 1839)  meclizine (ANTIVERT) tablet 25 mg (25 mg Oral Given 12/28/16 1716)  sodium chloride 0.9 % bolus 1,000 mL (0 mLs Intravenous Stopped 12/28/16 2047)  iopamidol (ISOVUE-370) 76 % injection 100 mL (100 mLs Intravenous Contrast Given 12/28/16  2000)     Initial Impression / Assessment and Plan / ED Course  I have reviewed the triage vital signs and the nursing notes.  Pertinent labs & imaging results that were available during my care of the patient were reviewed by me and considered in my medical decision making (see chart for details).     Patient states the well-appearing. D-dimer was mildly elevated but CT image her chest without obvious pulmonary embolism. Suspect mild degree of dehydration and given IV fluids. Patient also endorses vertiginous symptoms. Will start on meclizine. Advised to follow-up with her primary physician. Return precautions have been given.  Final Clinical Impressions(s) / ED Diagnoses   Final diagnoses:  Syncope and collapse  Peripheral vertigo involving left ear  Chest wall pain    New Prescriptions New Prescriptions  MECLIZINE (ANTIVERT) 25 MG TABLET    Take 1 tablet (25 mg total) by mouth 3 (three) times daily as needed for dizziness.     Julianne Rice, MD 12/28/16 2106

## 2016-12-28 NOTE — ED Triage Notes (Signed)
Light headed and weak x 2 todays, went to work today, felt like the room was spinning, chest tightness and passed out

## 2017-01-06 ENCOUNTER — Emergency Department (HOSPITAL_COMMUNITY)
Admission: EM | Admit: 2017-01-06 | Discharge: 2017-01-06 | Disposition: A | Discharge status: Home / Self Care | Payer: Self-pay | Attending: Emergency Medicine | Admitting: Emergency Medicine

## 2017-01-06 ENCOUNTER — Encounter (HOSPITAL_COMMUNITY): Payer: Self-pay

## 2017-01-06 ENCOUNTER — Emergency Department (HOSPITAL_COMMUNITY): Payer: Self-pay

## 2017-01-06 DIAGNOSIS — S90822A Blister (nonthermal), left foot, initial encounter: Secondary | ICD-10-CM | POA: Insufficient documentation

## 2017-01-06 DIAGNOSIS — Y999 Unspecified external cause status: Secondary | ICD-10-CM | POA: Insufficient documentation

## 2017-01-06 DIAGNOSIS — Y929 Unspecified place or not applicable: Secondary | ICD-10-CM | POA: Insufficient documentation

## 2017-01-06 DIAGNOSIS — X58XXXA Exposure to other specified factors, initial encounter: Secondary | ICD-10-CM | POA: Insufficient documentation

## 2017-01-06 DIAGNOSIS — T148XXA Other injury of unspecified body region, initial encounter: Secondary | ICD-10-CM

## 2017-01-06 DIAGNOSIS — Y939 Activity, unspecified: Secondary | ICD-10-CM | POA: Insufficient documentation

## 2017-01-06 NOTE — ED Provider Notes (Signed)
[facility] DEPT Provider Note   CSN: [id number] Arrival date & time: 01/06/17  0011     History   Chief Complaint Chief Complaint  Patient presents with  . Foot Pain    HPI Jessica Sanchez is a 31 y.o. female.  Patient presents with pain and swelling to her plantar left foot over the past 3 days. She denies any trauma. She's been wearing athletic shoes that are not new. She has a blister and bleeding underneath her plantar surface of her foot. Denies stepping on anything. No recent trips of to the beach or woods. No camping Trips. No insect bites. No fever. No focal weakness, numbness or tingling.   The history is provided by the patient.  Foot Pain  Pertinent negatives include no chest pain, no abdominal pain, no headaches and no shortness of breath.    Past Medical History:  Diagnosis Date  . Fibroid (bleeding) (uterine)   . Fibroid, uterine   . H/O metrorrhagia   . IUD (intrauterine device) in place 07/11/2015  . Menorrhagia     Patient Active Problem List   Diagnosis Date Noted  . IUD (intrauterine device) in place 07/11/2015  . Other disorder of menstruation and other abnormal bleeding from female genital tract 10/30/2012    Past Surgical History:  Procedure Laterality Date  . HYSTEROSCOPY W/D&C N/A 09/27/2013   Procedure: DILATATION AND CURETTAGE /HYSTEROSCOPY and insertion of Mirena IUD ;  Surgeon: [name]. Harrington Challenger, MD;  Location: [facility];  Service: Gynecology;  Laterality: N/A;  . MYOMECTOMY  02/03/2011   Procedure: MYOMECTOMY;  Surgeon: [name], MD;  Location: [facility];  Service: Gynecology;  Laterality: N/A;  . TONSILLECTOMY      OB History    Gravida Para Term Preterm AB Living   1       1     SAB TAB Ectopic Multiple Live Births   1               Home Medications    Prior to Admission medications   Medication Sig Start Date End Date Taking? Authorizing Provider  cyclobenzaprine (FLEXERIL) 10 MG tablet Take 1 tablet (10 mg total) by mouth 3  (three) times daily as needed for muscle spasms. 12/28/16   Julianne Rice, MD  ibuprofen (ADVIL,MOTRIN) 600 MG tablet Take 1 tablet (600 mg total) by mouth every 6 (six) hours as needed for moderate pain. Take with food 12/28/16   Julianne Rice, MD  levonorgestrel [facility]) 20 MCG/24HR IUD 1 each by Intrauterine route once.    [provider]  meclizine (ANTIVERT) 25 MG tablet Take 1 tablet (25 mg total) by mouth 3 (three) times daily as needed for dizziness. 12/28/16   Julianne Rice, MD  metroNIDAZOLE (FLAGYL) 500 MG tablet Take 1 tablet (500 mg total) by mouth 2 (two) times daily. Patient not taking: Reported on 12/04/2016 11/06/16   [name], MD  Multiple Vitamins-Calcium (ONE-A-DAY WOMENS PO) Take 1 tablet by mouth daily.    [provider]    Family History Family History  Problem Relation Age of Onset  . Cirrhosis Mother   . Diabetes Mother   . Cancer Maternal Grandfather   . Hypertension Sister   . Diabetes Sister   . Anesthesia problems Neg Hx   . Hypotension Neg Hx   . Malignant hyperthermia Neg Hx   . Pseudochol deficiency Neg Hx     Social History Social History  Substance Use Topics  . Smoking  status: Never Smoker  . Smokeless tobacco: Never Used  . Alcohol use No     Allergies   Shellfish allergy   Review of Systems Review of Systems  Constitutional: Negative for activity change, appetite change and fever.  Respiratory: Negative for cough, chest tightness and shortness of breath.   Cardiovascular: Negative for chest pain.  Gastrointestinal: Negative for abdominal pain, nausea and vomiting.  Genitourinary: Negative for vaginal bleeding and vaginal discharge.  Musculoskeletal: Negative for arthralgias and myalgias.  Skin: Positive for wound. Negative for rash.  Neurological: Negative for dizziness, weakness, light-headedness and headaches.   all other systems are negative except as noted in the HPI and PMH.     Physical  Exam Updated Vital Signs BP 116/71 (BP Location: Left Arm)   Pulse 87   Temp 97.7 F (36.5 C) (Oral)   Resp 17   Ht 5\' 1"  (1.549 m)   Wt 127 kg (280 lb)   SpO2 97%   BMI 52.91 kg/m   Physical Exam  Constitutional: She is oriented to person, place, and time. She appears well-developed and well-nourished. No distress.  HENT:  Head: Normocephalic and atraumatic.  Mouth/Throat: Oropharynx is clear and moist. No oropharyngeal exudate.  Eyes: Pupils are equal, round, and reactive to light. Conjunctivae and EOM are normal.  Neck: Normal range of motion. Neck supple.  No meningismus.  Cardiovascular: Normal rate, regular rhythm, normal heart sounds and intact distal pulses.   No murmur heard. Pulmonary/Chest: Effort normal and breath sounds normal. No respiratory distress.  Abdominal: Soft. There is no tenderness. There is no rebound and no guarding.  Musculoskeletal: Normal range of motion. She exhibits tenderness. She exhibits no edema.  Left foot has 2 cm blood blister on plantar surface underneath MTP joints. There is no visible foreign body. There is no fluctuance. Intact DP and PT pulses  Neurological: She is alert and oriented to person, place, and time. No cranial nerve deficit. She exhibits normal muscle tone. Coordination normal.   5/5 strength throughout. CN 2-12 intact.Equal grip strength.   Skin: Skin is warm.  Psychiatric: She has a normal mood and affect. Her behavior is normal.  Nursing note and vitals reviewed.    ED Treatments / Results  Labs (all labs ordered are listed, but only abnormal results are displayed) Labs Reviewed - No data to display  EKG  EKG Interpretation None       Radiology No results found.  Procedures Procedures (including critical care time)  Medications Ordered in ED Medications - No data to display   Initial Impression / Assessment and Plan / ED Course  I have reviewed the triage vital signs and the nursing notes.  Pertinent  labs & imaging results that were available during my care of the patient were reviewed by me and considered in my medical decision making (see chart for details).    Patient with blood blister to plantar surface of left foot. Neurovascularly intact. No evidence of infection.  Patient instructed to keep area elevated, keep area clean, do not rupture blister. She'll be given a hard soled shoe And podiatry follow-up. Return precautions discussed.  Final Clinical Impressions(s) / ED Diagnoses   Final diagnoses:  Blister    New Prescriptions New Prescriptions   No medications on file     Ezequiel Essex, MD 01/06/17 [phone]

## 2017-01-06 NOTE — ED Notes (Signed)
ED Provider at bedside. 

## 2017-01-06 NOTE — ED Triage Notes (Signed)
Pt has a small red area to the bottom of her left foot since Monday, states it feels like a blister.

## 2017-01-06 NOTE — Discharge Instructions (Signed)
Keep area clean and dry. Do not open blister. Followup with Dr. Caprice Beaver. Return to the ED if you develop worsening pain, fever, or any other concerns.

## 2017-01-11 ENCOUNTER — Emergency Department (HOSPITAL_COMMUNITY)
Admission: EM | Admit: 2017-01-11 | Discharge: 2017-01-11 | Disposition: A | Discharge status: Home / Self Care | Payer: Self-pay | Attending: Emergency Medicine | Admitting: Emergency Medicine

## 2017-01-11 ENCOUNTER — Encounter (HOSPITAL_COMMUNITY): Payer: Self-pay | Admitting: Cardiology

## 2017-01-11 ENCOUNTER — Emergency Department (HOSPITAL_COMMUNITY): Payer: Self-pay

## 2017-01-11 DIAGNOSIS — J069 Acute upper respiratory infection, unspecified: Secondary | ICD-10-CM | POA: Insufficient documentation

## 2017-01-11 DIAGNOSIS — R05 Cough: Secondary | ICD-10-CM | POA: Insufficient documentation

## 2017-01-11 LAB — RAPID STREP SCREEN (MED CTR MEBANE ONLY): STREPTOCOCCUS, GROUP A SCREEN (DIRECT): NEGATIVE

## 2017-01-11 MED ORDER — IBUPROFEN 800 MG PO TABS
800.0000 mg | ORAL_TABLET | Freq: Once | ORAL | Status: AC
  Administered 2017-01-11: 800 mg via ORAL
  Filled 2017-01-11: qty 1

## 2017-01-11 MED ORDER — OXYMETAZOLINE HCL 0.05 % NA SOLN
1.0000 | Freq: Once | NASAL | Status: AC
  Administered 2017-01-11: 1 via NASAL
  Filled 2017-01-11: qty 15

## 2017-01-11 MED ORDER — LORATADINE 10 MG PO TABS: 10.0000 mg | ORAL_TABLET | Freq: Every day | ORAL | 0 refills | Status: DC

## 2017-01-11 MED ORDER — LORATADINE 10 MG PO TABS
10.0000 mg | ORAL_TABLET | Freq: Once | ORAL | Status: AC
  Administered 2017-01-11: 10 mg via ORAL
  Filled 2017-01-11: qty 1

## 2017-01-11 NOTE — ED Triage Notes (Signed)
Sore throat,  chest congestion and fever for 2 days.  Also c/o sob.  Temp 101.5  At home today.

## 2017-01-11 NOTE — ED Provider Notes (Signed)
[facility] DEPT Provider Note   CSN: [id number] Arrival date & time: 01/11/17  1817     History   Chief Complaint Chief Complaint  Patient presents with  . Sore Throat  . Shortness of Breath    HPI Jessica Sanchez is a 31 y.o. female.  Pt presents to the ED today with sore throat, sinus congestion, and cough.  Sx started last night.  She had a fever today.  She took some alka seltzer cold medicine this morning.  No recent tylenol or ibuprofen.       Past Medical History:  Diagnosis Date  . Fibroid (bleeding) (uterine)   . Fibroid, uterine   . H/O metrorrhagia   . IUD (intrauterine device) in place 07/11/2015  . Menorrhagia     Patient Active Problem List   Diagnosis Date Noted  . IUD (intrauterine device) in place 07/11/2015  . Other disorder of menstruation and other abnormal bleeding from female genital tract 10/30/2012    Past Surgical History:  Procedure Laterality Date  . HYSTEROSCOPY W/D&C N/A 09/27/2013   Procedure: DILATATION AND CURETTAGE /HYSTEROSCOPY and insertion of Mirena IUD ;  Surgeon: [name]. Harrington Challenger, MD;  Location: [facility];  Service: Gynecology;  Laterality: N/A;  . MYOMECTOMY  02/03/2011   Procedure: MYOMECTOMY;  Surgeon: [name], MD;  Location: [facility];  Service: Gynecology;  Laterality: N/A;  . TONSILLECTOMY      OB History    Gravida Para Term Preterm AB Living   1       1     SAB TAB Ectopic Multiple Live Births   1               Home Medications    Prior to Admission medications   Medication Sig Start Date End Date Taking? Authorizing Provider  cyclobenzaprine (FLEXERIL) 10 MG tablet Take 1 tablet (10 mg total) by mouth 3 (three) times daily as needed for muscle spasms. 12/28/16   Julianne Rice, MD  ibuprofen (ADVIL,MOTRIN) 600 MG tablet Take 1 tablet (600 mg total) by mouth every 6 (six) hours as needed for moderate pain. Take with food 12/28/16   Julianne Rice, MD  levonorgestrel [facility]) 20 MCG/24HR IUD 1 each by  Intrauterine route once.    [provider]  loratadine (CLARITIN) 10 MG tablet Take 1 tablet (10 mg total) by mouth daily. 01/11/17   Isla Pence, MD  meclizine (ANTIVERT) 25 MG tablet Take 1 tablet (25 mg total) by mouth 3 (three) times daily as needed for dizziness. 12/28/16   Julianne Rice, MD  metroNIDAZOLE (FLAGYL) 500 MG tablet Take 1 tablet (500 mg total) by mouth 2 (two) times daily. Patient not taking: Reported on 12/04/2016 11/06/16   [name], MD  Multiple Vitamins-Calcium (ONE-A-DAY WOMENS PO) Take 1 tablet by mouth daily.    [provider]    Family History Family History  Problem Relation Age of Onset  . Cirrhosis Mother   . Diabetes Mother   . Cancer Maternal Grandfather   . Hypertension Sister   . Diabetes Sister   . Anesthesia problems Neg Hx   . Hypotension Neg Hx   . Malignant hyperthermia Neg Hx   . Pseudochol deficiency Neg Hx     Social History Social History  Substance Use Topics  . Smoking status: Never Smoker  . Smokeless tobacco: Never Used  . Alcohol use No     Allergies   Shellfish allergy   Review of Systems Review  of Systems  HENT: Positive for sinus pressure and sore throat.   Respiratory: Positive for cough.   All other systems reviewed and are negative.    Physical Exam Updated Vital Signs BP (!) 116/54   Pulse 91   Temp 98 F (36.7 C) (Oral)   Resp 18   Ht 5\' 1"  (1.549 m)   Wt 127 kg (280 lb)   SpO2 98%   BMI 52.91 kg/m   Physical Exam  Constitutional: She is oriented to person, place, and time. She appears well-developed and well-nourished.  HENT:  Head: Normocephalic and atraumatic.  Right Ear: External ear normal.  Left Ear: External ear normal.  Nose: Rhinorrhea present.  Mouth/Throat: Posterior oropharyngeal erythema present.  Eyes: Pupils are equal, round, and reactive to light. Conjunctivae and EOM are normal.  Neck: Normal range of motion.  Cardiovascular: Normal rate, regular rhythm,  normal heart sounds and intact distal pulses.   Pulmonary/Chest: Effort normal and breath sounds normal.  Abdominal: Soft. Bowel sounds are normal.  Musculoskeletal: Normal range of motion.  Lymphadenopathy:    She has cervical adenopathy.  Neurological: She is alert and oriented to person, place, and time.  Skin: Skin is warm.  Psychiatric: She has a normal mood and affect. Her behavior is normal. Judgment and thought content normal.  Nursing note and vitals reviewed.    ED Treatments / Results  Labs (all labs ordered are listed, but only abnormal results are displayed) Labs Reviewed  RAPID STREP SCREEN (NOT AT [facility])  CULTURE, GROUP A STREP [facility])    EKG  EKG Interpretation None       Radiology Dg Chest 2 View  Result Date: 01/11/2017 CLINICAL DATA:  Cough and shortness of breath for 2 days. EXAM: CHEST  2 VIEW COMPARISON:  12/28/2016 FINDINGS: The heart size and mediastinal contours are within normal limits. Both lungs are clear. The visualized skeletal structures are unremarkable. IMPRESSION: No active cardiopulmonary disease. Electronically Signed   By: Kerby Moors M.D.   On: 01/11/2017 19:33    Procedures Procedures (including critical care time)  Medications Ordered in ED Medications  ibuprofen (ADVIL,MOTRIN) tablet 800 mg (800 mg Oral Given 01/11/17 1915)  oxymetazoline (AFRIN) 0.05 % nasal spray 1 spray (1 spray Each Nare Given 01/11/17 1915)  loratadine (CLARITIN) tablet 10 mg (10 mg Oral Given 01/11/17 1915)     Initial Impression / Assessment and Plan / ED Course  I have reviewed the triage vital signs and the nursing notes.  Pertinent labs & imaging results that were available during my care of the patient were reviewed by me and considered in my medical decision making (see chart for details).   Pt is feeling much better.  She knows to return if worse.  F/u with pcp.  Final Clinical Impressions(s) / ED Diagnoses   Final diagnoses:  Viral upper  respiratory tract infection    New Prescriptions New Prescriptions   LORATADINE (CLARITIN) 10 MG TABLET    Take 1 tablet (10 mg total) by mouth daily.     Isla Pence, MD 01/11/17 [phone]

## 2017-01-14 LAB — CULTURE, GROUP A STREP (THRC)

## 2017-04-28 ENCOUNTER — Telehealth: Payer: Self-pay

## 2017-04-28 NOTE — Telephone Encounter (Signed)
Pt is on list of [facility] ED visitors. Pt was called today 04/28/17 to the number provided [phone]. No answer. Message was left on voicemail. Will attempt to call at a different time and date.   Needham Biggins R. Belford LPN [phone]

## 2017-05-18 ENCOUNTER — Telehealth: Payer: Self-pay

## 2017-05-18 NOTE — Telephone Encounter (Signed)
Pt is on list of [facility] ED visitors. Second phone call attempt was made today 05/18/17 to the number provided [phone].A person referring himself as the patients uncle, Jefm Miles, answered the phone and stated pt was not home at the time. The uncle stated the patient would be better reached at in the mornings  Will attempt to call pt in the morning on a different date.    Jamariya Davidoff R. Blair LPN [phone]

## 2017-05-30 ENCOUNTER — Emergency Department (HOSPITAL_COMMUNITY): Payer: Self-pay

## 2017-05-30 ENCOUNTER — Encounter (HOSPITAL_COMMUNITY): Payer: Self-pay | Admitting: Emergency Medicine

## 2017-05-30 ENCOUNTER — Emergency Department (HOSPITAL_COMMUNITY)
Admission: EM | Admit: 2017-05-30 | Discharge: 2017-05-30 | Disposition: A | Discharge status: Home / Self Care | Payer: Self-pay | Attending: Emergency Medicine | Admitting: Emergency Medicine

## 2017-05-30 ENCOUNTER — Other Ambulatory Visit: Payer: Self-pay

## 2017-05-30 DIAGNOSIS — Z79899 Other long term (current) drug therapy: Secondary | ICD-10-CM | POA: Insufficient documentation

## 2017-05-30 DIAGNOSIS — R0789 Other chest pain: Secondary | ICD-10-CM | POA: Insufficient documentation

## 2017-05-30 LAB — CBC WITH DIFFERENTIAL/PLATELET
BASOS PCT: 0 %
Basophils Absolute: 0 10*3/uL (ref 0.0–0.1)
EOS ABS: 0.1 10*3/uL (ref 0.0–0.7)
EOS PCT: 1 %
HCT: 38.3 % (ref 36.0–46.0)
Hemoglobin: 12.2 g/dL (ref 12.0–15.0)
LYMPHS ABS: 2.3 10*3/uL (ref 0.7–4.0)
Lymphocytes Relative: 46 %
MCH: 27.3 pg (ref 26.0–34.0)
MCHC: 31.9 g/dL (ref 30.0–36.0)
MCV: 85.7 fL (ref 78.0–100.0)
MONOS PCT: 8 %
Monocytes Absolute: 0.4 10*3/uL (ref 0.1–1.0)
NEUTROS PCT: 45 %
Neutro Abs: 2.2 10*3/uL (ref 1.7–7.7)
PLATELETS: 332 10*3/uL (ref 150–400)
RBC: 4.47 MIL/uL (ref 3.87–5.11)
RDW: 13.7 % (ref 11.5–15.5)
WBC: 5 10*3/uL (ref 4.0–10.5)

## 2017-05-30 LAB — BASIC METABOLIC PANEL
Anion gap: 9 (ref 5–15)
BUN: 11 mg/dL (ref 6–20)
CALCIUM: 8.7 mg/dL — AB (ref 8.9–10.3)
CO2: 22 mmol/L (ref 22–32)
CREATININE: 0.65 mg/dL (ref 0.44–1.00)
Chloride: 105 mmol/L (ref 101–111)
Glucose, Bld: 98 mg/dL (ref 65–99)
Potassium: 3.8 mmol/L (ref 3.5–5.1)
Sodium: 136 mmol/L (ref 135–145)

## 2017-05-30 LAB — I-STAT BETA HCG BLOOD, ED (MC, WL, AP ONLY): I-stat hCG, quantitative: <5 m[IU]/mL (ref <5)

## 2017-05-30 LAB — TSH: TSH: 3.051 u[IU]/mL (ref 0.350–4.500)

## 2017-05-30 LAB — I-STAT TROPONIN, ED
Troponin i, poc: 0 ng/mL (ref 0.00–0.08)
Troponin i, poc: 0 ng/mL (ref 0.00–0.08)

## 2017-05-30 LAB — D-DIMER, QUANTITATIVE (NOT AT ARMC): D DIMER QUANT: 0.41 ug{FEU}/mL (ref 0.00–0.50)

## 2017-05-30 MED ORDER — NAPROXEN 500 MG PO TABS: 500.0000 mg | ORAL_TABLET | Freq: Two times a day (BID) | ORAL | 0 refills | Status: DC

## 2017-05-30 NOTE — ED Notes (Signed)
Patient transported to X-ray 

## 2017-05-30 NOTE — ED Provider Notes (Signed)
[facility] EMERGENCY DEPARTMENT Provider Note   CSN: [id number] Arrival date & time: 05/30/17  8242     History   Chief Complaint Chief Complaint  Patient presents with  . Chest Pain    HPI Jessica Sanchez is a 32 y.o. female.  HPI Jessica Sanchez is a 32 y.o. female with no medical problems, presents to emergency department complaining of chest pain, shortness of breath, dizziness, palpitations.  Patient states her symptoms began at 5 AM this morning and woke her up from sleep.  Patient states symptoms are constant, pain radiates from the left side of her chest into her left arm and it feels "tingly."  She feels like her heart is racing.  She reports associated shortness of breath and dizziness.  She states her pain is worsened with movement and with taking deep breath.  Patient does not think she is pregnant and currently has Mirena IUD.  She does not take any other exogenous estrogens.  She does not smoke.  No recent travel or surgeries.  No history of blood clots.  Reports similar symptoms in the past and was not given a diagnosis.  She is unsure of her cholesterol, no history of high blood pressure or diabetes.  Patient is overweight.  Past Medical History:  Diagnosis Date  . Fibroid (bleeding) (uterine)   . Fibroid, uterine   . H/O metrorrhagia   . IUD (intrauterine device) in place 07/11/2015  . Menorrhagia     Patient Active Problem List   Diagnosis Date Noted  . IUD (intrauterine device) in place 07/11/2015  . Other disorder of menstruation and other abnormal bleeding from female genital tract 10/30/2012    Past Surgical History:  Procedure Laterality Date  . HYSTEROSCOPY W/D&C N/A 09/27/2013   Procedure: DILATATION AND CURETTAGE /HYSTEROSCOPY and insertion of Mirena IUD ;  Surgeon: [name]. Harrington Challenger, MD;  Location: [place] [facility];  Service: Gynecology;  Laterality: N/A;  . MYOMECTOMY  02/03/2011   Procedure: MYOMECTOMY;  Surgeon: [name], MD;  Location: [facility];  Service:  Gynecology;  Laterality: N/A;  . TONSILLECTOMY      OB History    Gravida Para Term Preterm AB Living   1       1     SAB TAB Ectopic Multiple Live Births   1               Home Medications    Prior to Admission medications   Medication Sig Start Date End Date Taking? Authorizing Provider  cyclobenzaprine (FLEXERIL) 10 MG tablet Take 1 tablet (10 mg total) by mouth 3 (three) times daily as needed for muscle spasms. 12/28/16   Julianne Rice, MD  ibuprofen (ADVIL,MOTRIN) 600 MG tablet Take 1 tablet (600 mg total) by mouth every 6 (six) hours as needed for moderate pain. Take with food 12/28/16   Julianne Rice, MD  levonorgestrel [facility]) 20 MCG/24HR IUD 1 each by Intrauterine route once.    [provider]  loratadine (CLARITIN) 10 MG tablet Take 1 tablet (10 mg total) by mouth daily. 01/11/17   Isla Pence, MD  meclizine (ANTIVERT) 25 MG tablet Take 1 tablet (25 mg total) by mouth 3 (three) times daily as needed for dizziness. 12/28/16   Julianne Rice, MD  metroNIDAZOLE (FLAGYL) 500 MG tablet Take 1 tablet (500 mg total) by mouth 2 (two) times daily. Patient not taking: Reported on 12/04/2016 11/06/16   [name], MD  Multiple Vitamins-Calcium (ONE-A-DAY WOMENS PO) Take  1 tablet by mouth daily.    [provider]    Family History Family History  Problem Relation Age of Onset  . Cirrhosis Mother   . Diabetes Mother   . Cancer Maternal Grandfather   . Hypertension Sister   . Diabetes Sister   . Anesthesia problems Neg Hx   . Hypotension Neg Hx   . Malignant hyperthermia Neg Hx   . Pseudochol deficiency Neg Hx     Social History Social History   Tobacco Use  . Smoking status: Never Smoker  . Smokeless tobacco: Never Used  Substance Use Topics  . Alcohol use: No  . Drug use: No     Allergies   Shellfish allergy   Review of Systems Review of Systems  Constitutional: Negative for chills and fever.  Respiratory: Positive for chest  tightness and shortness of breath. Negative for cough.   Cardiovascular: Positive for chest pain. Negative for palpitations and leg swelling.  Gastrointestinal: Negative for abdominal pain, diarrhea, nausea and vomiting.  Genitourinary: Negative for dysuria, flank pain, pelvic pain, vaginal bleeding, vaginal discharge and vaginal pain.  Musculoskeletal: Negative for arthralgias, myalgias, neck pain and neck stiffness.  Skin: Negative for rash.  Neurological: Positive for dizziness and light-headedness. Negative for weakness and headaches.  All other systems reviewed and are negative.    Physical Exam Updated Vital Signs BP 120/86   Pulse 95   Temp 98 F (36.7 C) (Oral)   Resp (!) 25   Ht 5\' 2"  (1.575 m)   Wt 122.5 kg (270 lb)   SpO2 95%   BMI 49.38 kg/m   Physical Exam  Constitutional: She is oriented to person, place, and time. She appears well-developed and well-nourished. No distress.  HENT:  Head: Normocephalic.  Eyes: Conjunctivae are normal.  Neck: Neck supple.  Cardiovascular: Normal rate, regular rhythm and normal heart sounds.  Pulmonary/Chest: Effort normal and breath sounds normal. No respiratory distress. She has no wheezes. She has no rales. She exhibits tenderness.  ttp over left anterior chest  Abdominal: Soft. Bowel sounds are normal. She exhibits no distension. There is no tenderness. There is no rebound.  Musculoskeletal: She exhibits no edema.  Neurological: She is alert and oriented to person, place, and time.  Skin: Skin is warm and dry.  Psychiatric: She has a normal mood and affect. Her behavior is normal.  Nursing note and vitals reviewed.    ED Treatments / Results  Labs (all labs ordered are listed, but only abnormal results are displayed) Labs Reviewed  BASIC METABOLIC PANEL - Abnormal; Notable for the following components:      Result Value   Calcium 8.7 (*)    All other components within normal limits  CBC WITH DIFFERENTIAL/PLATELET    D-DIMER, QUANTITATIVE (NOT AT [facility])  TSH  I-STAT BETA HCG BLOOD, ED ([facility], [facility], [facility] ONLY)  I-STAT TROPONIN, ED  I-STAT TROPONIN, ED    EKG  EKG Interpretation  Date/Time:  Monday May 30 2017 12:11:46 EST Ventricular Rate:  81 PR Interval:    QRS Duration: 85 QT Interval:  362 QTC Calculation: 421 R Axis:   -3 Text Interpretation:  Sinus rhythm No STEMI. Similar to prior tracings.  Confirmed by Nanda Quinton [phone]) on 05/30/2017 12:35:07 PM       Radiology Dg Chest 2 View  Result Date: 05/30/2017 CLINICAL DATA:  Chest pain. EXAM: CHEST  2 VIEW COMPARISON:  Radiographs of January 11, 2017. FINDINGS: The heart size and mediastinal contours are  within normal limits. Both lungs are clear. No pneumothorax or pleural effusion is noted. The visualized skeletal structures are unremarkable. IMPRESSION: No active cardiopulmonary disease. Electronically Signed   By: Marijo Conception, M.D.   On: 05/30/2017 10:33    Procedures Procedures (including critical care time)  Medications Ordered in ED Medications - No data to display   Initial Impression / Assessment and Plan / ED Course  I have reviewed the triage vital signs and the nursing notes.  Pertinent labs & imaging results that were available during my care of the patient were reviewed by me and considered in my medical decision making (see chart for details).     Pt with left sided chest pain, constat since 5 am, woke her up from sleep. VS normal except for HR in 90s. Pain reproducible with palpation of the chest and deep breathing. Risk factor for acs includes obesity, Heart score of 1. Will get labs, d dimer, ecg, cxr and monitor.   10:51 AM ECG showing sinus rhythm, no concerning findings, artifact present, will repeat. Labs are all unremarkable including negative d dimer and trop. Will monitor and repeat ECG and trop at 3 hrs.   1:14 PM Repeat troponin and EKG unremarkable.  Discussed results with patient.  Will discharge  home.  Will have patient follow-up with family doctor if symptoms continued.  Will start on NSAIDs.  Return precautions discussed.  Vitals:   05/30/17 1030 05/30/17 1045 05/30/17 1100 05/30/17 1130  BP: 102/79  103/79 118/84  Pulse: 89 91 91 81  Resp: 19 20 18 18   Temp:      TempSrc:      SpO2: 98% 97% 97% 97%  Weight:      Height:         Final Clinical Impressions(s) / ED Diagnoses   Final diagnoses:  Atypical chest pain    ED Discharge Orders        Ordered    naproxen (NAPROSYN) 500 MG tablet  2 times daily     05/30/17 1316       Jeannett Senior, PA-C 05/30/17 1320    Margette Fast, MD 05/30/17 1918

## 2017-05-30 NOTE — Discharge Instructions (Signed)
Take Naprosyn as prescribed for pain.  Please follow-up with family doctor if not improving. Return if worsening symptoms.

## 2017-05-30 NOTE — ED Triage Notes (Signed)
Chest pain describes and palpitations/beating fast for past three days. Some SOB,left side arm pain.

## 2017-06-06 ENCOUNTER — Telehealth: Payer: Self-pay | Admitting: *Deleted

## 2017-06-06 NOTE — Telephone Encounter (Signed)
Patient states she has been having abdominal pain recently but doesn't know if it's related to her IUD in which she has had for 4 years. Advised patient she would need to make appointment to have it assess. Verbalized understanding. Appt made.

## 2017-06-08 ENCOUNTER — Emergency Department (HOSPITAL_COMMUNITY)
Admission: EM | Admit: 2017-06-08 | Discharge: 2017-06-08 | Discharge status: Against Medical Advice | Payer: Self-pay | Attending: Emergency Medicine | Admitting: Emergency Medicine

## 2017-06-08 ENCOUNTER — Encounter (HOSPITAL_COMMUNITY): Payer: Self-pay

## 2017-06-08 DIAGNOSIS — R109 Unspecified abdominal pain: Secondary | ICD-10-CM | POA: Insufficient documentation

## 2017-06-08 DIAGNOSIS — Z5321 Procedure and treatment not carried out due to patient leaving prior to being seen by health care provider: Secondary | ICD-10-CM | POA: Insufficient documentation

## 2017-06-08 LAB — URINALYSIS, ROUTINE W REFLEX MICROSCOPIC
Bilirubin Urine: NEGATIVE
Glucose, UA: NEGATIVE mg/dL
Ketones, ur: NEGATIVE mg/dL
Leukocytes, UA: NEGATIVE
Nitrite: NEGATIVE
PROTEIN: NEGATIVE mg/dL
SPECIFIC GRAVITY, URINE: 1.018 (ref 1.005–1.030)
pH: 7 (ref 5.0–8.0)

## 2017-06-08 LAB — COMPREHENSIVE METABOLIC PANEL
ALBUMIN: 3.5 g/dL (ref 3.5–5.0)
ALK PHOS: 52 U/L (ref 38–126)
ALT: 16 U/L (ref 14–54)
ANION GAP: 9 (ref 5–15)
AST: 16 U/L (ref 15–41)
BILIRUBIN TOTAL: 0.5 mg/dL (ref 0.3–1.2)
BUN: 13 mg/dL (ref 6–20)
CALCIUM: 9.1 mg/dL (ref 8.9–10.3)
CO2: 21 mmol/L — ABNORMAL LOW (ref 22–32)
Chloride: 105 mmol/L (ref 101–111)
Creatinine, Ser: 0.57 mg/dL (ref 0.44–1.00)
GFR calc Af Amer: >60 mL/min (ref >60)
GLUCOSE: 109 mg/dL — AB (ref 65–99)
Potassium: 4.2 mmol/L (ref 3.5–5.1)
Sodium: 135 mmol/L (ref 135–145)
TOTAL PROTEIN: 6.3 g/dL — AB (ref 6.5–8.1)

## 2017-06-08 LAB — I-STAT TROPONIN, ED: TROPONIN I, POC: 0 ng/mL (ref 0.00–0.08)

## 2017-06-08 LAB — LIPASE, BLOOD: Lipase: 24 U/L (ref 11–51)

## 2017-06-08 LAB — CBC
HEMATOCRIT: 37.1 % (ref 36.0–46.0)
Hemoglobin: 12 g/dL (ref 12.0–15.0)
MCH: 27.5 pg (ref 26.0–34.0)
MCHC: 32.3 g/dL (ref 30.0–36.0)
MCV: 84.9 fL (ref 78.0–100.0)
Platelets: 386 10*3/uL (ref 150–400)
RBC: 4.37 MIL/uL (ref 3.87–5.11)
RDW: 14.2 % (ref 11.5–15.5)
WBC: 6 10*3/uL (ref 4.0–10.5)

## 2017-06-08 NOTE — ED Triage Notes (Signed)
Patient complains of chest pain that has resolved and abdominal discomfort that has persisted x 1 week. Seen in ED for same and here for further evaluation

## 2017-06-08 NOTE — ED Notes (Signed)
Pt remains in waiting room. Updated on wait for treatment room. 

## 2017-06-08 NOTE — ED Notes (Signed)
Pt. Has left.

## 2017-06-09 ENCOUNTER — Other Ambulatory Visit: Payer: Self-pay

## 2017-06-09 ENCOUNTER — Encounter (HOSPITAL_COMMUNITY): Payer: Self-pay | Admitting: Emergency Medicine

## 2017-06-09 ENCOUNTER — Ambulatory Visit (HOSPITAL_COMMUNITY)
Admission: EM | Admit: 2017-06-09 | Discharge: 2017-06-09 | Disposition: A | Discharge status: Home / Self Care | Payer: PRIVATE HEALTH INSURANCE | Attending: Family Medicine | Admitting: Family Medicine

## 2017-06-09 ENCOUNTER — Emergency Department (HOSPITAL_COMMUNITY)
Admission: EM | Admit: 2017-06-09 | Discharge: 2017-06-09 | Disposition: A | Discharge status: Home / Self Care | Payer: No Typology Code available for payment source | Attending: Emergency Medicine | Admitting: Emergency Medicine

## 2017-06-09 DIAGNOSIS — Z5321 Procedure and treatment not carried out due to patient leaving prior to being seen by health care provider: Secondary | ICD-10-CM | POA: Diagnosis not present

## 2017-06-09 DIAGNOSIS — R109 Unspecified abdominal pain: Secondary | ICD-10-CM | POA: Insufficient documentation

## 2017-06-09 DIAGNOSIS — R1032 Left lower quadrant pain: Secondary | ICD-10-CM | POA: Diagnosis not present

## 2017-06-09 LAB — CBC WITH DIFFERENTIAL/PLATELET
Basophils Absolute: 0 10*3/uL (ref 0.0–0.1)
Basophils Relative: 0 %
EOS PCT: 1 %
Eosinophils Absolute: 0.1 10*3/uL (ref 0.0–0.7)
HCT: 38.6 % (ref 36.0–46.0)
Hemoglobin: 12.5 g/dL (ref 12.0–15.0)
LYMPHS ABS: 3 10*3/uL (ref 0.7–4.0)
LYMPHS PCT: 44 %
MCH: 27.8 pg (ref 26.0–34.0)
MCHC: 32.4 g/dL (ref 30.0–36.0)
MCV: 85.8 fL (ref 78.0–100.0)
MONO ABS: 0.5 10*3/uL (ref 0.1–1.0)
MONOS PCT: 7 %
Neutro Abs: 3.2 10*3/uL (ref 1.7–7.7)
Neutrophils Relative %: 48 %
PLATELETS: 317 10*3/uL (ref 150–400)
RBC: 4.5 MIL/uL (ref 3.87–5.11)
RDW: 14.2 % (ref 11.5–15.5)
WBC: 6.7 10*3/uL (ref 4.0–10.5)

## 2017-06-09 LAB — COMPREHENSIVE METABOLIC PANEL
ALT: 15 U/L (ref 14–54)
AST: 16 U/L (ref 15–41)
Albumin: 3.7 g/dL (ref 3.5–5.0)
Alkaline Phosphatase: 51 U/L (ref 38–126)
Anion gap: 9 (ref 5–15)
BUN: 14 mg/dL (ref 6–20)
CALCIUM: 8.9 mg/dL (ref 8.9–10.3)
CHLORIDE: 104 mmol/L (ref 101–111)
CO2: 23 mmol/L (ref 22–32)
CREATININE: 0.68 mg/dL (ref 0.44–1.00)
Glucose, Bld: 105 mg/dL — ABNORMAL HIGH (ref 65–99)
Potassium: 4.3 mmol/L (ref 3.5–5.1)
Sodium: 136 mmol/L (ref 135–145)
Total Bilirubin: 0.5 mg/dL (ref 0.3–1.2)
Total Protein: 6.7 g/dL (ref 6.5–8.1)

## 2017-06-09 LAB — URINALYSIS, ROUTINE W REFLEX MICROSCOPIC
Bilirubin Urine: NEGATIVE
GLUCOSE, UA: NEGATIVE mg/dL
Ketones, ur: NEGATIVE mg/dL
Leukocytes, UA: NEGATIVE
Nitrite: NEGATIVE
PH: 6 (ref 5.0–8.0)
Protein, ur: NEGATIVE mg/dL
SPECIFIC GRAVITY, URINE: 1.025 (ref 1.005–1.030)

## 2017-06-09 LAB — LIPASE, BLOOD: LIPASE: 25 U/L (ref 11–51)

## 2017-06-09 NOTE — ED Provider Notes (Signed)
[place]    CSN: [id number] Arrival date & time: 06/09/17  1443     History   Chief Complaint Chief Complaint  Patient presents with  . Abdominal Cramping    HPI Jessica Sanchez is a 32 y.o. female.   32 year old obese female, with history of fibroids, presenting today complaining of 3 days of left lower quadrant abdominal pain.  Patient states that her pain has been constant and gradually worsening.  States that she presented to the emergency room last night but left due to the wait time.  States that her pain is worsened with movement as well as having a bowel movement.  States that she has had subjective fever, chills, nausea and loose stool.  Denies a history of diverticulitis.   The history is provided by the patient.  Abdominal Cramping  This is a new problem. The current episode started more than 2 days ago. The problem occurs constantly. The problem has not changed since onset.Associated symptoms include abdominal pain. Pertinent negatives include no chest pain, no headaches and no shortness of breath. The symptoms are aggravated by bending, walking and twisting. Nothing relieves the symptoms. She has tried nothing for the symptoms. The treatment provided no relief.    Past Medical History:  Diagnosis Date  . Fibroid (bleeding) (uterine)   . Fibroid, uterine   . H/O metrorrhagia   . IUD (intrauterine device) in place 07/11/2015  . Menorrhagia     Patient Active Problem List   Diagnosis Date Noted  . IUD (intrauterine device) in place 07/11/2015  . Other disorder of menstruation and other abnormal bleeding from female genital tract 10/30/2012    Past Surgical History:  Procedure Laterality Date  . HYSTEROSCOPY W/D&C N/A 09/27/2013   Procedure: DILATATION AND CURETTAGE /HYSTEROSCOPY and insertion of Mirena IUD ;  Surgeon: [name]. Harrington Challenger, MD;  Location: [facility];  Service: Gynecology;  Laterality: N/A;  . MYOMECTOMY  02/03/2011   Procedure: MYOMECTOMY;   Surgeon: [name], MD;  Location: [facility];  Service: Gynecology;  Laterality: N/A;  . TONSILLECTOMY      OB History    Gravida Para Term Preterm AB Living   1       1     SAB TAB Ectopic Multiple Live Births   1               Home Medications    Prior to Admission medications   Medication Sig Start Date End Date Taking? Authorizing Provider  cyclobenzaprine (FLEXERIL) 10 MG tablet Take 1 tablet (10 mg total) by mouth 3 (three) times daily as needed for muscle spasms. Patient not taking: Reported on 05/30/2017 12/28/16   Julianne Rice, MD  ibuprofen (ADVIL,MOTRIN) 600 MG tablet Take 1 tablet (600 mg total) by mouth every 6 (six) hours as needed for moderate pain. Take with food 12/28/16   Julianne Rice, MD  levonorgestrel [facility]) 20 MCG/24HR IUD 1 each by Intrauterine route once.    [provider]  meclizine (ANTIVERT) 25 MG tablet Take 1 tablet (25 mg total) by mouth 3 (three) times daily as needed for dizziness. Patient not taking: Reported on 05/30/2017 12/28/16   Julianne Rice, MD  metroNIDAZOLE (FLAGYL) 500 MG tablet Take 1 tablet (500 mg total) by mouth 2 (two) times daily. Patient not taking: Reported on 12/04/2016 11/06/16   [name], MD  Multiple Vitamins-Calcium (ONE-A-DAY WOMENS PO) Take 1 tablet by mouth daily.    [provider]  naproxen (NAPROSYN) 500 MG tablet Take 1 tablet (500 mg total) by mouth 2 (two) times daily. 05/30/17   Jeannett Senior, PA-C    Family History Family History  Problem Relation Age of Onset  . Cirrhosis Mother   . Diabetes Mother   . Cancer Maternal Grandfather   . Hypertension Sister   . Diabetes Sister   . Anesthesia problems Neg Hx   . Hypotension Neg Hx   . Malignant hyperthermia Neg Hx   . Pseudochol deficiency Neg Hx     Social History Social History   Tobacco Use  . Smoking status: Never Smoker  . Smokeless tobacco: Never Used  Substance Use Topics  . Alcohol use: No  . Drug use: No      Allergies   Shellfish allergy   Review of Systems Review of Systems  Constitutional: Positive for chills. Negative for fever.  HENT: Negative for ear pain and sore throat.   Eyes: Negative for pain and visual disturbance.  Respiratory: Negative for cough and shortness of breath.   Cardiovascular: Negative for chest pain and palpitations.  Gastrointestinal: Positive for abdominal pain and nausea. Negative for vomiting.  Genitourinary: Negative for dysuria and hematuria.  Musculoskeletal: Negative for arthralgias and back pain.  Skin: Negative for color change and rash.  Neurological: Negative for seizures, syncope and headaches.  All other systems reviewed and are negative.    Physical Exam Triage Vital Signs ED Triage Vitals [06/09/17 1552]  Enc Vitals Group     BP (!) 142/81     Pulse Rate 85     Resp 16     Temp 99.4 F (37.4 C)     Temp src      SpO2 100 %     Weight      Height      Head Circumference      Peak Flow      Pain Score 10     Pain Loc      Pain Edu?      Excl. in [place]?    No data found.  Updated Vital Signs BP (!) 142/81   Pulse 85   Temp 99.4 F (37.4 C)   Resp 16   SpO2 100%   Visual Acuity Right Eye Distance:   Left Eye Distance:   Bilateral Distance:    Right Eye Near:   Left Eye Near:    Bilateral Near:     Physical Exam  Constitutional: She appears well-developed and well-nourished. No distress.  HENT:  Head: Normocephalic and atraumatic.  Eyes: Conjunctivae are normal.  Neck: Neck supple.  Cardiovascular: Normal rate and regular rhythm.  No murmur heard. Pulmonary/Chest: Effort normal and breath sounds normal. No respiratory distress.  Abdominal: Soft. There is tenderness in the left lower quadrant. There is rebound.  Musculoskeletal: She exhibits no edema.  Neurological: She is alert.  Skin: Skin is warm and dry.  Psychiatric: She has a normal mood and affect.  Nursing note and vitals reviewed.    UC  Treatments / Results  Labs (all labs ordered are listed, but only abnormal results are displayed) Labs Reviewed - No data to display  EKG  EKG Interpretation None       Radiology No results found.  Procedures Procedures (including critical care time)  Medications Ordered in UC Medications - No data to display   Initial Impression / Assessment and Plan / UC Course  I have reviewed the triage vital signs and the nursing notes.  Pertinent labs & imaging results that were available during my care of the patient were reviewed by me and considered in my medical decision making (see chart for details).     Patient has had 3 days of left lower quadrant abdominal pain that is worsened by movement.  Exam concerning for peritoneal signs with left lower quadrant tenderness.  Advised patient that she needed to be evaluated in the emergency department to rule out possible diverticular abscess versus perforation.  She agrees to follow-up in the emergency department at this time.  Final Clinical Impressions(s) / UC Diagnoses   Final diagnoses:  LLQ abdominal pain    ED Discharge Orders    None       Controlled Substance Prescriptions [place] Controlled Substance Registry consulted? Not Applicable   Phebe Colla, [place] 06/09/17 1621

## 2017-06-09 NOTE — ED Triage Notes (Signed)
Pt c/o LLQ abdominal pain x3 days, tender to palpation. Denies n/v/d or urinary symptoms.

## 2017-06-09 NOTE — Discharge Instructions (Signed)
Patient needs a CT scan of her abdomen/pelvis. She has peritoneal signs on exam. Concern for possible diverticular abscess/perforation

## 2017-06-09 NOTE — ED Notes (Signed)
Patient LWBS last night and went to UC today. Concern for possible ruptured diverticuli.

## 2017-06-10 ENCOUNTER — Ambulatory Visit: Payer: Self-pay | Admitting: Obstetrics and Gynecology

## 2017-06-19 ENCOUNTER — Encounter (HOSPITAL_COMMUNITY): Payer: Self-pay | Admitting: *Deleted

## 2017-06-19 ENCOUNTER — Emergency Department (HOSPITAL_COMMUNITY)
Admission: EM | Admit: 2017-06-19 | Discharge: 2017-06-19 | Disposition: A | Discharge status: Home / Self Care | Payer: PRIVATE HEALTH INSURANCE | Attending: Emergency Medicine | Admitting: Emergency Medicine

## 2017-06-19 DIAGNOSIS — Z79899 Other long term (current) drug therapy: Secondary | ICD-10-CM | POA: Diagnosis not present

## 2017-06-19 DIAGNOSIS — J111 Influenza due to unidentified influenza virus with other respiratory manifestations: Secondary | ICD-10-CM | POA: Diagnosis not present

## 2017-06-19 DIAGNOSIS — J3489 Other specified disorders of nose and nasal sinuses: Secondary | ICD-10-CM | POA: Insufficient documentation

## 2017-06-19 DIAGNOSIS — R509 Fever, unspecified: Secondary | ICD-10-CM | POA: Insufficient documentation

## 2017-06-19 DIAGNOSIS — M791 Myalgia, unspecified site: Secondary | ICD-10-CM | POA: Diagnosis not present

## 2017-06-19 DIAGNOSIS — R0981 Nasal congestion: Secondary | ICD-10-CM | POA: Diagnosis not present

## 2017-06-19 DIAGNOSIS — Z209 Contact with and (suspected) exposure to unspecified communicable disease: Secondary | ICD-10-CM | POA: Insufficient documentation

## 2017-06-19 DIAGNOSIS — R05 Cough: Secondary | ICD-10-CM | POA: Diagnosis present

## 2017-06-19 MED ORDER — OSELTAMIVIR PHOSPHATE 75 MG PO CAPS: 75.0000 mg | ORAL_CAPSULE | Freq: Two times a day (BID) | ORAL | 0 refills | Status: DC

## 2017-06-19 MED ORDER — IBUPROFEN 800 MG PO TABS: 800.0000 mg | ORAL_TABLET | Freq: Three times a day (TID) | ORAL | 0 refills | Status: DC

## 2017-06-19 MED ORDER — BENZONATATE 200 MG PO CAPS
200.0000 mg | ORAL_CAPSULE | Freq: Three times a day (TID) | ORAL | 0 refills | Status: DC | PRN
Start: 2017-06-19 — End: 2017-07-27

## 2017-06-19 NOTE — Discharge Instructions (Signed)
Rest, drink plenty of fluids.  Tylenol every 4 hours if needed for fever.  Follow-up with your primary doctor for recheck or return to the ER for any worsening symptoms.

## 2017-06-19 NOTE — ED Triage Notes (Addendum)
Pt reports cough, sore throat, chest congestion, fever, HA, and generalized body aches that began Thursday. Pt took Tylenol at 1530.

## 2017-06-20 NOTE — ED Provider Notes (Signed)
[facility] EMERGENCY DEPARTMENT Provider Note   CSN: [id number] Arrival date & time: 06/19/17  1942     History   Chief Complaint Chief Complaint  Patient presents with  . Flu like symptoms    HPI Jessica Sanchez is a 32 y.o. female.  HPI   Jessica Sanchez is a 32 y.o. female who presents to the Emergency Department complaining of cough, sore throat, nasal congestion, generalized body aches, and intermittent fever.  Symptoms began Friday.  Max fever at home of 101.  States she has been taking Tylenol with her last dose at 3:30 today.  Cough has been nonproductive.  She states that she has been exposed to coworkers that have similar symptoms.  She denies chest pain, shortness of breath, abdominal pain, dysuria, vomiting or diarrhea.   Past Medical History:  Diagnosis Date  . Fibroid (bleeding) (uterine)   . Fibroid, uterine   . H/O metrorrhagia   . IUD (intrauterine device) in place 07/11/2015  . Menorrhagia     Patient Active Problem List   Diagnosis Date Noted  . IUD (intrauterine device) in place 07/11/2015  . Other disorder of menstruation and other abnormal bleeding from female genital tract 10/30/2012    Past Surgical History:  Procedure Laterality Date  . HYSTEROSCOPY W/D&C N/A 09/27/2013   Procedure: DILATATION AND CURETTAGE /HYSTEROSCOPY and insertion of Mirena IUD ;  Surgeon: [name]. Harrington Challenger, MD;  Location: [facility];  Service: Gynecology;  Laterality: N/A;  . MYOMECTOMY  02/03/2011   Procedure: MYOMECTOMY;  Surgeon: [name], MD;  Location: [facility];  Service: Gynecology;  Laterality: N/A;  . TONSILLECTOMY      OB History    Gravida Para Term Preterm AB Living   1       1     SAB TAB Ectopic Multiple Live Births   1               Home Medications    Prior to Admission medications   Medication Sig Start Date End Date Taking? Authorizing Provider  acetaminophen (TYLENOL) 500 MG tablet Take 500-1,000 mg by mouth every 6 (six) hours as needed (for pain).     [provider]  albuterol (PROVENTIL HFA) 108 (90 Base) MCG/ACT inhaler Inhale 1-2 puffs into the lungs every 6 (six) hours as needed for wheezing or shortness of breath.     [provider]  benzonatate (TESSALON) 200 MG capsule Take 1 capsule (200 mg total) by mouth 3 (three) times daily as needed for cough. Swallow whole, do not chew 06/19/17   Shley Dolby, PA-C  cyclobenzaprine (FLEXERIL) 10 MG tablet Take 1 tablet (10 mg total) by mouth 3 (three) times daily as needed for muscle spasms. Patient not taking: Reported on 06/09/2017 12/28/16   Julianne Rice, MD  ibuprofen (ADVIL,MOTRIN) 800 MG tablet Take 1 tablet (800 mg total) by mouth 3 (three) times daily. 06/19/17   Cylan Borum, PA-C  levonorgestrel (MIRENA) 20 MCG/24HR IUD 1 each by Intrauterine route once. EVERY 5 YEARS    [provider]  meclizine (ANTIVERT) 25 MG tablet Take 1 tablet (25 mg total) by mouth 3 (three) times daily as needed for dizziness. Patient not taking: Reported on 06/09/2017 12/28/16   Julianne Rice, MD  metroNIDAZOLE (FLAGYL) 500 MG tablet Take 1 tablet (500 mg total) by mouth 2 (two) times daily. Patient not taking: Reported on 06/09/2017 11/06/16   [name], MD  Multiple Vitamins-Calcium (ONE-A-DAY WOMENS PO) Take  1 tablet by mouth daily.    [provider]  naproxen (NAPROSYN) 500 MG tablet Take 1 tablet (500 mg total) by mouth 2 (two) times daily. 05/30/17   Kirichenko, Lahoma Rocker, PA-C  oseltamivir (TAMIFLU) 75 MG capsule Take 1 capsule (75 mg total) by mouth 2 (two) times daily. 06/19/17   Kem Parkinson, PA-C    Family History Family History  Problem Relation Age of Onset  . Cirrhosis Mother   . Diabetes Mother   . Cancer Maternal Grandfather   . Hypertension Sister   . Diabetes Sister   . Anesthesia problems Neg Hx   . Hypotension Neg Hx   . Malignant hyperthermia Neg Hx   . Pseudochol deficiency Neg Hx     Social History Social History   Tobacco Use  .  Smoking status: Never Smoker  . Smokeless tobacco: Never Used  Substance Use Topics  . Alcohol use: No  . Drug use: No     Allergies   Shellfish allergy and Adhesive [tape]   Review of Systems Review of Systems  Constitutional: Positive for chills and fever. Negative for activity change and appetite change.  HENT: Positive for congestion, rhinorrhea, sneezing and sore throat. Negative for facial swelling and trouble swallowing.   Eyes: Negative for visual disturbance.  Respiratory: Positive for cough. Negative for shortness of breath, wheezing and stridor.   Cardiovascular: Negative for chest pain.  Gastrointestinal: Negative for abdominal pain, nausea and vomiting.  Genitourinary: Negative for dysuria and flank pain.  Musculoskeletal: Positive for myalgias. Negative for arthralgias, neck pain and neck stiffness.  Skin: Negative for rash.  Neurological: Negative for dizziness, weakness, numbness and headaches.  Hematological: Negative for adenopathy.  Psychiatric/Behavioral: Negative for confusion.  All other systems reviewed and are negative.    Physical Exam Updated Vital Signs BP 128/75 (BP Location: Right Arm)   Pulse 79   Temp 98.3 F (36.8 C) (Oral)   Resp 18   Ht 5\' 2"  (1.575 m)   Wt 120.2 kg (265 lb)   SpO2 99%   BMI 48.47 kg/m   Physical Exam  Constitutional: She is oriented to person, place, and time. She appears well-developed and well-nourished. No distress.  HENT:  Head: Normocephalic and atraumatic.  Right Ear: Tympanic membrane and ear canal normal.  Left Ear: Tympanic membrane and ear canal normal.  Nose: Mucosal edema and rhinorrhea present.  Mouth/Throat: Uvula is midline and mucous membranes are normal. No trismus in the jaw. No uvula swelling. Posterior oropharyngeal erythema present. No oropharyngeal exudate, posterior oropharyngeal edema or tonsillar abscesses.  Eyes: Conjunctivae are normal.  Neck: Normal range of motion and phonation normal.  Neck supple. No Kernig's sign noted.  Cardiovascular: Normal rate, regular rhythm and intact distal pulses.  No murmur heard. Pulmonary/Chest: Effort normal and breath sounds normal. No respiratory distress. She has no wheezes. She has no rales.  Abdominal: Soft. She exhibits no distension. There is no tenderness. There is no rebound and no guarding.  Musculoskeletal: She exhibits no edema.  Lymphadenopathy:    She has no cervical adenopathy.  Neurological: She is alert and oriented to person, place, and time. No sensory deficit. She exhibits normal muscle tone. Coordination normal.  Skin: Skin is warm and dry. Capillary refill takes less than 2 seconds.  Psychiatric: She has a normal mood and affect.  Nursing note and vitals reviewed.    ED Treatments / Results  Labs (all labs ordered are listed, but only abnormal results are displayed) Labs Reviewed -  No data to display  EKG  EKG Interpretation None       Radiology No results found.  Procedures Procedures (including critical care time)  Medications Ordered in ED Medications - No data to display   Initial Impression / Assessment and Plan / ED Course  I have reviewed the triage vital signs and the nursing notes.  Pertinent labs & imaging results that were available during my care of the patient were reviewed by me and considered in my medical decision making (see chart for details).     Patient nontoxic-appearing.  Vitals reassuring.  Likely viral process versus influenza.  Patient appears safe for discharge home, discussed treatment plan, patient requesting Tamiflu.  Return precautions were also discussed.  Final Clinical Impressions(s) / ED Diagnoses   Final diagnoses:  Flu syndrome    ED Discharge Orders        Ordered    ibuprofen (ADVIL,MOTRIN) 800 MG tablet  3 times daily     06/19/17 2106    benzonatate (TESSALON) 200 MG capsule  3 times daily PRN     06/19/17 2106    oseltamivir (TAMIFLU) 75 MG  capsule  2 times daily     06/19/17 2106       Kem Parkinson, PA-C 06/20/17 0010    Virgel Manifold, MD 06/20/17 2312

## 2017-07-27 ENCOUNTER — Encounter (HOSPITAL_COMMUNITY): Payer: Self-pay | Admitting: Emergency Medicine

## 2017-07-27 ENCOUNTER — Emergency Department (HOSPITAL_COMMUNITY)
Admission: EM | Admit: 2017-07-27 | Discharge: 2017-07-27 | Disposition: A | Discharge status: Home / Self Care | Payer: No Typology Code available for payment source | Attending: Emergency Medicine | Admitting: Emergency Medicine

## 2017-07-27 ENCOUNTER — Other Ambulatory Visit: Payer: Self-pay

## 2017-07-27 DIAGNOSIS — M62838 Other muscle spasm: Secondary | ICD-10-CM | POA: Diagnosis not present

## 2017-07-27 DIAGNOSIS — X503XXA Overexertion from repetitive movements, initial encounter: Secondary | ICD-10-CM | POA: Insufficient documentation

## 2017-07-27 DIAGNOSIS — Z79899 Other long term (current) drug therapy: Secondary | ICD-10-CM | POA: Insufficient documentation

## 2017-07-27 DIAGNOSIS — M542 Cervicalgia: Secondary | ICD-10-CM | POA: Diagnosis present

## 2017-07-27 MED ORDER — CYCLOBENZAPRINE HCL 10 MG PO TABS: 10.0000 mg | ORAL_TABLET | Freq: Three times a day (TID) | ORAL | 0 refills | Status: DC

## 2017-07-27 MED ORDER — IBUPROFEN 800 MG PO TABS: 800.0000 mg | ORAL_TABLET | Freq: Three times a day (TID) | ORAL | 0 refills | Status: DC

## 2017-07-27 NOTE — ED Triage Notes (Signed)
Pt states right side neck pain down into arm. Hurts to turn head to right side. Denies injury.

## 2017-07-27 NOTE — Discharge Instructions (Addendum)
Your examination reveals muscle strain/spasm involving the upper shoulder and neck area.  Please use a heating pad to the area when at rest.  Use ibuprofen with breakfast, dinner, and at bedtime.  Use Flexeril 3 times daily over the next 3 days, and then on an as needed basis for spasm pain. This medication may cause drowsiness. Please do not drink, drive, or participate in activity that requires concentration while taking this medication.

## 2017-07-27 NOTE — ED Provider Notes (Signed)
[facility] EMERGENCY DEPARTMENT Provider Note   CSN: [id number] Arrival date & time: 07/27/17  1410     History   Chief Complaint Chief Complaint  Patient presents with  . Neck Pain    HPI Jessica Sanchez is a 32 y.o. female.  Patient is a 32 year old female who presents to the emergency department with complaint of neck pain.  The patient states that she has pain of the right side of the neck that goes down into the arm.  The pain is aggravated with certain movements. Pt does repetitive movement, twisting, and lifting at work.  She is not dropping objects, but has tingling sensation in her upper extremity.  There is been no recent injury.  No recent operations or procedures reported.  No recent changes in medications.  She presents now for assistance with this issue.     Past Medical History:  Diagnosis Date  . Fibroid (bleeding) (uterine)   . Fibroid, uterine   . H/O metrorrhagia   . IUD (intrauterine device) in place 07/11/2015  . Menorrhagia     Patient Active Problem List   Diagnosis Date Noted  . IUD (intrauterine device) in place 07/11/2015  . Other disorder of menstruation and other abnormal bleeding from female genital tract 10/30/2012    Past Surgical History:  Procedure Laterality Date  . HYSTEROSCOPY W/D&C N/A 09/27/2013   Procedure: DILATATION AND CURETTAGE /HYSTEROSCOPY and insertion of Mirena IUD ;  Surgeon: [name]. Harrington Challenger, MD;  Location: [facility];  Service: Gynecology;  Laterality: N/A;  . MYOMECTOMY  02/03/2011   Procedure: MYOMECTOMY;  Surgeon: [name], MD;  Location: [facility];  Service: Gynecology;  Laterality: N/A;  . TONSILLECTOMY       OB History    Gravida  1   Para      Term      Preterm      AB  1   Living        SAB  1   TAB      Ectopic      Multiple      Live Births               Home Medications    Prior to Admission medications   Medication Sig Start Date End Date Taking? Authorizing Provider  albuterol  (PROVENTIL HFA) 108 (90 Base) MCG/ACT inhaler Inhale 1-2 puffs into the lungs every 6 (six) hours as needed for wheezing or shortness of breath.    Yes [provider]  ibuprofen (ADVIL,MOTRIN) 800 MG tablet Take 1 tablet (800 mg total) by mouth 3 (three) times daily. 06/19/17  Yes Triplett, Tammy, PA-C  levonorgestrel (MIRENA) 20 MCG/24HR IUD 1 each by Intrauterine route once. EVERY 5 YEARS   Yes [provider]  Multiple Vitamins-Calcium (ONE-A-DAY WOMENS PO) Take 1 tablet by mouth daily.   Yes [provider]  cyclobenzaprine (FLEXERIL) 10 MG tablet Take 1 tablet (10 mg total) by mouth 3 (three) times daily as needed for muscle spasms. Patient not taking: Reported on 06/09/2017 12/28/16   Julianne Rice, MD  naproxen (NAPROSYN) 500 MG tablet Take 1 tablet (500 mg total) by mouth 2 (two) times daily. 05/30/17   Kirichenko, Lahoma Rocker, PA-C  oseltamivir (TAMIFLU) 75 MG capsule Take 1 capsule (75 mg total) by mouth 2 (two) times daily. 06/19/17   Kem Parkinson, PA-C    Family History Family History  Problem Relation Age of Onset  . Cirrhosis Mother   . Diabetes  Mother   . Cancer Maternal Grandfather   . Hypertension Sister   . Diabetes Sister   . Anesthesia problems Neg Hx   . Hypotension Neg Hx   . Malignant hyperthermia Neg Hx   . Pseudochol deficiency Neg Hx     Social History Social History   Tobacco Use  . Smoking status: Never Smoker  . Smokeless tobacco: Never Used  Substance Use Topics  . Alcohol use: No  . Drug use: No     Allergies   Shellfish allergy and Adhesive [tape]   Review of Systems Review of Systems  Constitutional: Negative for activity change.       All ROS Neg except as noted in HPI  HENT: Negative for nosebleeds.   Eyes: Negative for photophobia and discharge.  Respiratory: Negative for cough, shortness of breath and wheezing.   Cardiovascular: Negative for chest pain and palpitations.  Gastrointestinal: Negative for  abdominal pain and blood in stool.  Genitourinary: Negative for dysuria, frequency and hematuria.  Musculoskeletal: Positive for neck pain. Negative for arthralgias and back pain.  Skin: Negative.   Neurological: Negative for dizziness, seizures and speech difficulty.  Psychiatric/Behavioral: Negative for confusion and hallucinations.     Physical Exam Updated Vital Signs BP 128/80 (BP Location: Left Arm)   Pulse 85   Temp 98 F (36.7 C) (Oral)   Resp 16   Ht 5\' 2"  (1.575 m)   Wt 119.3 kg (263 lb)   SpO2 95%   BMI 48.10 kg/m   Physical Exam  Constitutional: She is oriented to person, place, and time. She appears well-developed and well-nourished.  Non-toxic appearance.  HENT:  Head: Normocephalic.  Right Ear: Tympanic membrane and external ear normal.  Left Ear: Tympanic membrane and external ear normal.  Eyes: Pupils are equal, round, and reactive to light. EOM and lids are normal.  Neck: Normal range of motion. Neck supple. Carotid bruit is not present.  Cardiovascular: Normal rate, regular rhythm, normal heart sounds, intact distal pulses and normal pulses.  Pulmonary/Chest: Breath sounds normal. No respiratory distress.  Abdominal: Soft. Bowel sounds are normal. There is no tenderness. There is no guarding.  Musculoskeletal:       Right shoulder: She exhibits decreased range of motion and tenderness.       Cervical back: She exhibits tenderness and spasm.       Arms: Lymphadenopathy:       Head (right side): No submandibular adenopathy present.       Head (left side): No submandibular adenopathy present.    She has no cervical adenopathy.  Neurological: She is alert and oriented to person, place, and time. She has normal strength. No cranial nerve deficit or sensory deficit.  Skin: Skin is warm and dry.  Psychiatric: She has a normal mood and affect. Her speech is normal.  Nursing note and vitals reviewed.    ED Treatments / Results  Labs (all labs ordered are  listed, but only abnormal results are displayed) Labs Reviewed - No data to display  EKG None  Radiology No results found.  Procedures Procedures (including critical care time)  Medications Ordered in ED Medications - No data to display   Initial Impression / Assessment and Plan / ED Course  I have reviewed the triage vital signs and the nursing notes.  Pertinent labs & imaging results that were available during my care of the patient were reviewed by me and considered in my medical decision making (see chart for details).  Final Clinical Impressions(s) / ED Diagnoses MDM  Vital signs within normal limits.  Pulse oximetry is 95% on room air.  Within normal limits by my interpretation.  The examination is consistent with muscle strain involving the trapezius area on the right greater than on the left.  There are no gross neurologic deficits appreciated.  No neurovascular deficits noted.  Patient will be fitted with a sling to use over the next 3 days.  Patient will be treated with a muscle relaxer as well as an anti-inflammatory pain medication.  Patient is referred to orthopedics for orthopedic evaluation and management if not improving from initial treatment.   Final diagnoses:  Trapezius muscle spasm    ED Discharge Orders        Ordered    cyclobenzaprine (FLEXERIL) 10 MG tablet  3 times daily     07/27/17 1517    ibuprofen (ADVIL,MOTRIN) 800 MG tablet  3 times daily     07/27/17 1517       Lily Kocher, PA-C 07/27/17 1519    Orlie Dakin, MD 07/27/17 1547

## 2017-08-01 ENCOUNTER — Emergency Department (HOSPITAL_COMMUNITY): Payer: No Typology Code available for payment source

## 2017-08-01 ENCOUNTER — Encounter (HOSPITAL_COMMUNITY): Payer: Self-pay | Admitting: Emergency Medicine

## 2017-08-01 ENCOUNTER — Emergency Department (HOSPITAL_COMMUNITY)
Admission: EM | Admit: 2017-08-01 | Discharge: 2017-08-01 | Disposition: A | Discharge status: Home / Self Care | Payer: No Typology Code available for payment source | Attending: Emergency Medicine | Admitting: Emergency Medicine

## 2017-08-01 ENCOUNTER — Other Ambulatory Visit: Payer: Self-pay

## 2017-08-01 DIAGNOSIS — R1084 Generalized abdominal pain: Secondary | ICD-10-CM | POA: Diagnosis not present

## 2017-08-01 DIAGNOSIS — R1031 Right lower quadrant pain: Secondary | ICD-10-CM | POA: Diagnosis present

## 2017-08-01 DIAGNOSIS — B349 Viral infection, unspecified: Secondary | ICD-10-CM | POA: Diagnosis not present

## 2017-08-01 LAB — CBC WITH DIFFERENTIAL/PLATELET
BASOS ABS: 0 10*3/uL (ref 0.0–0.1)
Basophils Relative: 0 %
EOS PCT: 1 %
Eosinophils Absolute: 0.1 10*3/uL (ref 0.0–0.7)
HCT: 37.5 % (ref 36.0–46.0)
Hemoglobin: 12.2 g/dL (ref 12.0–15.0)
LYMPHS PCT: 43 %
Lymphs Abs: 2.6 10*3/uL (ref 0.7–4.0)
MCH: 27.4 pg (ref 26.0–34.0)
MCHC: 32.5 g/dL (ref 30.0–36.0)
MCV: 84.3 fL (ref 78.0–100.0)
Monocytes Absolute: 0.5 10*3/uL (ref 0.1–1.0)
Monocytes Relative: 8 %
Neutro Abs: 3 10*3/uL (ref 1.7–7.7)
Neutrophils Relative %: 48 %
PLATELETS: 292 10*3/uL (ref 150–400)
RBC: 4.45 MIL/uL (ref 3.87–5.11)
RDW: 13.4 % (ref 11.5–15.5)
WBC: 6.1 10*3/uL (ref 4.0–10.5)

## 2017-08-01 LAB — URINALYSIS, ROUTINE W REFLEX MICROSCOPIC
Bilirubin Urine: NEGATIVE
Glucose, UA: NEGATIVE mg/dL
Ketones, ur: NEGATIVE mg/dL
Leukocytes, UA: NEGATIVE
Nitrite: NEGATIVE
PROTEIN: NEGATIVE mg/dL
Specific Gravity, Urine: 1.013 (ref 1.005–1.030)
pH: 7 (ref 5.0–8.0)

## 2017-08-01 LAB — COMPREHENSIVE METABOLIC PANEL
ALT: 12 U/L — AB (ref 14–54)
AST: 13 U/L — AB (ref 15–41)
Albumin: 4 g/dL (ref 3.5–5.0)
Alkaline Phosphatase: 62 U/L (ref 38–126)
Anion gap: 10 (ref 5–15)
BUN: 12 mg/dL (ref 6–20)
CHLORIDE: 103 mmol/L (ref 101–111)
CO2: 23 mmol/L (ref 22–32)
Calcium: 9.4 mg/dL (ref 8.9–10.3)
Creatinine, Ser: 0.68 mg/dL (ref 0.44–1.00)
GFR calc Af Amer: >60 mL/min (ref >60)
GLUCOSE: 97 mg/dL (ref 65–99)
Potassium: 4 mmol/L (ref 3.5–5.1)
Sodium: 136 mmol/L (ref 135–145)
Total Bilirubin: 0.5 mg/dL (ref 0.3–1.2)
Total Protein: 7.5 g/dL (ref 6.5–8.1)

## 2017-08-01 LAB — LIPASE, BLOOD: LIPASE: 20 U/L (ref 11–51)

## 2017-08-01 NOTE — Discharge Instructions (Addendum)
Clear liquids. Tylenol for abdominal pain or body aches. Followed up with Dr. Cindie Laroche if symptoms persist

## 2017-08-01 NOTE — ED Provider Notes (Signed)
[facility] EMERGENCY DEPARTMENT Provider Note   CSN: [id number] Arrival date & time: 08/01/17  1306     History   Chief Complaint Chief Complaint  Patient presents with  . Abdominal Pain    HPI Jessica Sanchez is a 32 y.o. female.  Complaint is multiple complaints  HPI [age] female.  Her primary concern seems to be right lower quadrant abdominal pain.  She states she felt weak yesterday and today.  Has had a cough.  No dysuria.  No fever chills Reiger's.  Poor appetite but had "ham sandwich" a few hours ago.  States this left her "weak and nauseated my right side hurts".  Past Medical History:  Diagnosis Date  . Fibroid (bleeding) (uterine)   . Fibroid, uterine   . H/O metrorrhagia   . IUD (intrauterine device) in place 07/11/2015  . Menorrhagia     Patient Active Problem List   Diagnosis Date Noted  . IUD (intrauterine device) in place 07/11/2015  . Other disorder of menstruation and other abnormal bleeding from female genital tract 10/30/2012    Past Surgical History:  Procedure Laterality Date  . HYSTEROSCOPY W/D&C N/A 09/27/2013   Procedure: DILATATION AND CURETTAGE /HYSTEROSCOPY and insertion of Mirena IUD ;  Surgeon: [name]. Harrington Challenger, MD;  Location: [facility];  Service: Gynecology;  Laterality: N/A;  . MYOMECTOMY  02/03/2011   Procedure: MYOMECTOMY;  Surgeon: [name], MD;  Location: [facility];  Service: Gynecology;  Laterality: N/A;  . TONSILLECTOMY       OB History    Gravida  1   Para      Term      Preterm      AB  1   Living        SAB  1   TAB      Ectopic      Multiple      Live Births               Home Medications    Prior to Admission medications   Medication Sig Start Date End Date Taking? Authorizing Provider  albuterol (PROVENTIL HFA) 108 (90 Base) MCG/ACT inhaler Inhale 1-2 puffs into the lungs every 6 (six) hours as needed for wheezing or shortness of breath.    Yes [provider]  levonorgestrel (MIRENA) 20  MCG/24HR IUD 1 each by Intrauterine route once. EVERY 5 YEARS   Yes [provider]  Multiple Vitamins-Calcium (ONE-A-DAY WOMENS PO) Take 1 tablet by mouth daily.   Yes [provider]  cyclobenzaprine (FLEXERIL) 10 MG tablet Take 1 tablet (10 mg total) by mouth 3 (three) times daily. 07/27/17   Lily Kocher, PA-C  ibuprofen (ADVIL,MOTRIN) 800 MG tablet Take 1 tablet (800 mg total) by mouth 3 (three) times daily. 07/27/17   Lily Kocher, PA-C  naproxen (NAPROSYN) 500 MG tablet Take 1 tablet (500 mg total) by mouth 2 (two) times daily. Patient not taking: Reported on 08/01/2017 05/30/17   Jeannett Senior, PA-C  oseltamivir (TAMIFLU) 75 MG capsule Take 1 capsule (75 mg total) by mouth 2 (two) times daily. Patient not taking: Reported on 08/01/2017 06/19/17   Kem Parkinson, PA-C    Family History Family History  Problem Relation Age of Onset  . Cirrhosis Mother   . Diabetes Mother   . Cancer Maternal Grandfather   . Hypertension Sister   . Diabetes Sister   . Anesthesia problems Neg Hx   . Hypotension Neg Hx   .  Malignant hyperthermia Neg Hx   . Pseudochol deficiency Neg Hx     Social History Social History   Tobacco Use  . Smoking status: Never Smoker  . Smokeless tobacco: Never Used  Substance Use Topics  . Alcohol use: No  . Drug use: No     Allergies   Shellfish allergy and Adhesive [tape]   Review of Systems Review of Systems  Constitutional: Positive for appetite change. Negative for chills, diaphoresis, fatigue and fever.  HENT: Negative for mouth sores, sore throat and trouble swallowing.   Eyes: Negative for visual disturbance.  Respiratory: Negative for cough, chest tightness, shortness of breath and wheezing.   Cardiovascular: Negative for chest pain.  Gastrointestinal: Positive for abdominal pain. Negative for abdominal distention, diarrhea, nausea and vomiting.  Endocrine: Negative for polydipsia, polyphagia and polyuria.  Genitourinary:  Negative for dysuria, frequency and hematuria.  Musculoskeletal: Negative for gait problem.  Skin: Negative for color change, pallor and rash.  Neurological: Positive for weakness. Negative for dizziness, syncope, light-headedness and headaches.  Hematological: Does not bruise/bleed easily.  Psychiatric/Behavioral: Negative for behavioral problems and confusion.     Physical Exam Updated Vital Signs BP 121/70 (BP Location: Left Arm)   Pulse 70   Temp 98.6 F (37 C) (Oral)   Resp 16   Ht 5\' 2"  (1.575 m)   Wt 119.3 kg (263 lb)   SpO2 100%   BMI 48.10 kg/m   Physical Exam  Constitutional: She is oriented to person, place, and time. She appears well-developed and well-nourished. No distress.  HENT:  Head: Normocephalic.  Eyes: Pupils are equal, round, and reactive to light. Conjunctivae are normal. No scleral icterus.  Neck: Normal range of motion. Neck supple. No thyromegaly present.  Cardiovascular: Normal rate and regular rhythm. Exam reveals no gallop and no friction rub.  No murmur heard. Pulmonary/Chest: Effort normal and breath sounds normal. No respiratory distress. She has no wheezes. She has no rales.  Abdominal: Soft. Bowel sounds are normal. She exhibits no distension. There is no tenderness. There is no rebound.  .  Points to right lower quadrant.  No distention or peritoneal irritation.  Musculoskeletal: Normal range of motion.  Neurological: She is alert and oriented to person, place, and time.  Skin: Skin is warm and dry. No rash noted.  Psychiatric: She has a normal mood and affect. Her behavior is normal.     ED Treatments / Results  Labs (all labs ordered are listed, but only abnormal results are displayed) Labs Reviewed  URINALYSIS, ROUTINE W REFLEX MICROSCOPIC - Abnormal; Notable for the following components:      Result Value   Color, Urine STRAW (*)    APPearance HAZY (*)    Hgb urine dipstick MODERATE (*)    Bacteria, UA RARE (*)    Squamous  Epithelial / LPF 6-30 (*)    All other components within normal limits  COMPREHENSIVE METABOLIC PANEL - Abnormal; Notable for the following components:   AST 13 (*)    ALT 12 (*)    All other components within normal limits  CBC WITH DIFFERENTIAL/PLATELET  LIPASE, BLOOD    EKG None  Radiology Ct Renal Stone Study  Result Date: 08/01/2017 CLINICAL DATA:  Right lower quadrant pain for 3 days EXAM: CT ABDOMEN AND PELVIS WITHOUT CONTRAST TECHNIQUE: Multidetector CT imaging of the abdomen and pelvis was performed following the standard protocol without IV contrast. COMPARISON:  CT 09/11/2015, 06/30/2015 FINDINGS: Lower chest: Visible lung bases are clear.  Borderline cardiomegaly. Hepatobiliary: No focal liver abnormality is seen. No gallstones, gallbladder wall thickening, or biliary dilatation. Pancreas: Unremarkable. No pancreatic ductal dilatation or surrounding inflammatory changes. Spleen: Normal in size without focal abnormality. Adrenals/Urinary Tract: Adrenal glands are unremarkable. Kidneys are normal, without renal calculi, focal lesion, or hydronephrosis. Bladder is unremarkable. Stomach/Bowel: Stomach is within normal limits. Appendix appears normal. No evidence of bowel wall thickening, distention, or inflammatory changes. Vascular/Lymphatic: No significant vascular findings are present. No enlarged abdominal or pelvic lymph nodes. Reproductive: Intrauterine device in place.  No adnexal mass. Other: Small fat in the umbilical region. No free air or free fluid. Musculoskeletal: No acute or significant osseous findings. IMPRESSION: 1. No CT evidence for nephrolithiasis, hydronephrosis or ureteral stone 2. No CT evidence for acute intra-abdominal or pelvic abnormality. Electronically Signed   By: Donavan Foil M.D.   On: 08/01/2017 19:11    Procedures Procedures (including critical care time)  Medications Ordered in ED Medications - No data to display   Initial Impression / Assessment  and Plan / ED Course  I have reviewed the triage vital signs and the nursing notes.  Pertinent labs & imaging results that were available during my care of the patient were reviewed by me and considered in my medical decision making (see chart for details).    Concerning exam or history for appendicitis or stones however she has hematuria.  CT stone shows no maladies.  Appendix visualized and negative.  Plan is expectant management Tylenol clear liquids recheck with any worsening symptoms  Final Clinical Impressions(s) / ED Diagnoses   Final diagnoses:  Generalized abdominal pain  Viral syndrome    ED Discharge Orders    None       Tanna Furry, MD 08/01/17 2023

## 2017-08-01 NOTE — ED Notes (Signed)
Updated pt on wait time.

## 2017-08-01 NOTE — ED Triage Notes (Signed)
Pt c/o RLQ pain x 1 day. Denies n/v/d. States had bm but was balls yesterday. Nad.

## 2017-08-18 ENCOUNTER — Encounter: Payer: Self-pay | Admitting: Orthopedic Surgery

## 2017-08-18 ENCOUNTER — Ambulatory Visit (INDEPENDENT_AMBULATORY_CARE_PROVIDER_SITE_OTHER): Payer: PRIVATE HEALTH INSURANCE

## 2017-08-18 ENCOUNTER — Ambulatory Visit: Payer: PRIVATE HEALTH INSURANCE | Admitting: Orthopedic Surgery

## 2017-08-18 VITALS — BP 119/75 | HR 86 | Ht 63.0 in | Wt 270.0 lb

## 2017-08-18 DIAGNOSIS — M25511 Pain in right shoulder: Secondary | ICD-10-CM | POA: Diagnosis not present

## 2017-08-18 DIAGNOSIS — M75101 Unspecified rotator cuff tear or rupture of right shoulder, not specified as traumatic: Secondary | ICD-10-CM | POA: Diagnosis not present

## 2017-08-18 MED ORDER — DICLOFENAC SODIUM 75 MG PO TBEC: 75.0000 mg | DELAYED_RELEASE_TABLET | Freq: Two times a day (BID) | ORAL | 2 refills | Status: DC

## 2017-08-18 NOTE — Progress Notes (Signed)
NEW PATIENT OFFICE VISIT   Chief Complaint  Patient presents with  . Shoulder Pain    ER follow up on right shoulder pain, no injury.     MEDICAL DECISION SECTION  xrays ordered? YES   My independent reading of xrays: Normal x-ray right shoulder see my dictated report   Encounter Diagnosis  Name Primary?  . Pain in joint of right shoulder Yes     PLAN:  Subacromial injection right shoulder Physical therapy 6 weeks Follow-up as needed after 6 weeks  Meds ordered this encounter  Medications  . diclofenac (VOLTAREN) 75 MG EC tablet    Sig: Take 1 tablet (75 mg total) by mouth 2 (two) times daily with a meal.    Dispense:  60 tablet    Refill:  2   Injection? Y  Procedure note the subacromial injection shoulder RIGHT  Verbal consent was obtained to inject the  RIGHT   Shoulder  Timeout was completed to confirm the injection site is a subacromial space of the  RIGHT  shoulder   Medication used Depo-Medrol 40 mg and lidocaine 1% 3 cc  Anesthesia was provided by ethyl chloride  The injection was performed in the RIGHT  posterior subacromial space. After pinning the skin with alcohol and anesthetized the skin with ethyl chloride the subacromial space was injected using a 20-gauge needle. There were no complications  Sterile dressing was applied.  20 pound lifting restriction for 6 weekS    Chief Complaint  Patient presents with  . Shoulder Pain    ER follow up on right shoulder pain, no injury.    32 year old female presents for evaluation of right shoulder pain  32 year old female works for [employer] for the last 6 months complains of dull pain around the right shoulder radiating up into the cervical spine and down to the elbow associated with decreased range of motion over the last 1-2 months   Review of Systems  Musculoskeletal: Positive for neck pain.  Skin: Negative.   Neurological: Negative.      Past Medical History:  Diagnosis Date   . Fibroid (bleeding) (uterine)   . Fibroid, uterine   . H/O metrorrhagia   . IUD (intrauterine device) in place 07/11/2015  . Menorrhagia     Past Surgical History:  Procedure Laterality Date  . HYSTEROSCOPY W/D&C N/A 09/27/2013   Procedure: DILATATION AND CURETTAGE /HYSTEROSCOPY and insertion of Mirena IUD ;  Surgeon: [name]. Harrington Challenger, MD;  Location: [facility];  Service: Gynecology;  Laterality: N/A;  . MYOMECTOMY  02/03/2011   Procedure: MYOMECTOMY;  Surgeon: [name], MD;  Location: [facility];  Service: Gynecology;  Laterality: N/A;  . TONSILLECTOMY      Family History  Problem Relation Age of Onset  . Cirrhosis Mother   . Diabetes Mother   . Cancer Maternal Grandfather   . Hypertension Sister   . Diabetes Sister   . Anesthesia problems Neg Hx   . Hypotension Neg Hx   . Malignant hyperthermia Neg Hx   . Pseudochol deficiency Neg Hx    Social History   Tobacco Use  . Smoking status: Never Smoker  . Smokeless tobacco: Never Used  Substance Use Topics  . Alcohol use: No  . Drug use: No    @ALL @  No outpatient medications have been marked as taking for the 08/18/17 encounter (Office Visit) with Carole Civil, MD.    BP 119/75   Pulse 86   Ht 5\' 3"  (  1.6 m)   Wt 270 lb (122.5 kg)   BMI 47.83 kg/m   Physical Exam  Constitutional: She is oriented to person, place, and time. She appears well-developed and well-nourished.  Neurological: She is alert and oriented to person, place, and time.  Psychiatric: She has a normal mood and affect. Judgment normal.  Vitals reviewed.   Right Shoulder Exam   Tenderness  The patient is experiencing tenderness in the acromion (Right trapezius).  Range of Motion  Active abduction: normal  Passive abduction: normal  Extension: normal  External rotation: normal  Forward flexion: abnormal  Internal rotation 0 degrees: abnormal   Muscle Strength  The patient has normal right shoulder strength.  Tests  Apprehension:  negative Impingement: positive Drop arm: negative  Other  Erythema: absent Sensation: normal Pulse: present   Left Shoulder Exam  Left shoulder exam is normal.  Tenderness  The patient is experiencing no tenderness.   Range of Motion  The patient has normal left shoulder ROM.  Muscle Strength  The patient has normal left shoulder strength.  Tests  Apprehension: negative  Other  Erythema: absent Sensation: normal Pulse: present

## 2017-08-18 NOTE — Patient Instructions (Addendum)
Shoulder Impingement Syndrome Shoulder impingement syndrome is a condition that causes pain when connective tissues (tendons) surrounding the shoulder joint become pinched. These tendons are part of the group of muscles and tissues that help to stabilize the shoulder (rotator cuff). Beneath the rotator cuff is a fluid-filled sac (bursa) that allows the muscles and tendons to glide smoothly. The bursa may become swollen or irritated (bursitis). Bursitis, swelling in the rotator cuff tendons, or both conditions can decrease how much space is under a bone in the shoulder joint (acromion), resulting in impingement. What are the causes? Shoulder impingement syndrome can be caused by bursitis or swelling of the rotator cuff tendons, which may result from:  Repetitive overhead arm movements.  Falling onto the shoulder.  Weakness in the shoulder muscles.  What increases the risk? You may be more likely to develop this condition if you are an athlete who participates in:  Sports that involve throwing, such as baseball.  Tennis.  Swimming.  Volleyball.  Some people are also more likely to develop impingement syndrome because of the shape of their acromion bone. What are the signs or symptoms? The main symptom of this condition is pain on the front or side of the shoulder. Pain may:  Get worse when lifting or raising the arm.  Get worse at night.  Wake you up from sleeping.  Feel sharp when the shoulder is moved, and then fade to an ache.  Other signs and symptoms may include:  Tenderness.  Stiffness.  Inability to raise the arm above shoulder level or behind the body.  Weakness.  How is this diagnosed? This condition may be diagnosed based on:  Your symptoms.  Your medical history.  A physical exam.  Imaging tests, such as: ? X-rays. ? MRI. ? Ultrasound.  How is this treated? Treatment for this condition may include:  Resting your shoulder and avoiding all  activities that cause pain or put stress on the shoulder.  Icing your shoulder.  NSAIDs to help reduce pain and swelling.  One or more injections of medicines to numb the area and reduce inflammation.  Physical therapy.  Surgery. This may be needed if nonsurgical treatments have not helped. Surgery may involve repairing the rotator cuff, reshaping the acromion, or removing the bursa.  Follow these instructions at home: Managing pain, stiffness, and swelling  If directed, apply ice to the injured area. ? Put ice in a plastic bag. ? Place a towel between your skin and the bag. ? Leave the ice on for 20 minutes, 2-3 times a day. Activity  Rest and return to your normal activities as told by your health care provider. Ask your health care provider what activities are safe for you.  Do exercises as told by your health care provider. General instructions  Do not use any tobacco products, including cigarettes, chewing tobacco, or e-cigarettes. Tobacco can delay healing. If you need help quitting, ask your health care provider.  Ask your health care provider when it is safe for you to drive.  Take over-the-counter and prescription medicines only as told by your health care provider.  Keep all follow-up visits as told by your health care provider. This is important. How is this prevented?  Give your body time to rest between periods of activity.  Be safe and responsible while being active to avoid falls.  Maintain physical fitness, including strength and flexibility. Contact a health care provider if:  Your symptoms have not improved after 1-2 months of treatment and   rest.  You cannot lift your arm away from your body. This information is not intended to replace advice given to you by your health care provider. Make sure you discuss any questions you have with your health care provider. Document Released: 04/19/2005 Document Revised: 12/25/2015 Document Reviewed:  03/22/2015 Elsevier Interactive Patient Education  2018 [employer]   NOTE OOW TODAY   LIFT RESTRICT 20 LBS X 6 WEEKS

## 2017-08-19 ENCOUNTER — Telehealth (HOSPITAL_COMMUNITY): Payer: Self-pay | Admitting: Family Medicine

## 2017-08-19 NOTE — Telephone Encounter (Signed)
Patient is not sure of the name of her insurance company. She is going to call her job and let us no. WT

## 2017-08-31 ENCOUNTER — Ambulatory Visit (HOSPITAL_COMMUNITY): Payer: PRIVATE HEALTH INSURANCE | Admitting: Specialist

## 2017-08-31 ENCOUNTER — Telehealth (HOSPITAL_COMMUNITY): Payer: Self-pay | Admitting: Family Medicine

## 2017-08-31 NOTE — Telephone Encounter (Signed)
08/31/17  pt called and left a messge to cx and wants to RS for 5/3... she said she couldn't find a ride to bring her

## 2017-09-07 ENCOUNTER — Ambulatory Visit (HOSPITAL_COMMUNITY): Payer: No Typology Code available for payment source | Attending: Family Medicine | Admitting: Occupational Therapy

## 2017-09-07 ENCOUNTER — Encounter (HOSPITAL_COMMUNITY): Payer: Self-pay | Admitting: Occupational Therapy

## 2017-09-07 ENCOUNTER — Other Ambulatory Visit: Payer: Self-pay

## 2017-09-07 DIAGNOSIS — M25511 Pain in right shoulder: Secondary | ICD-10-CM | POA: Diagnosis present

## 2017-09-07 DIAGNOSIS — R29898 Other symptoms and signs involving the musculoskeletal system: Secondary | ICD-10-CM | POA: Diagnosis present

## 2017-09-07 NOTE — Therapy (Signed)
[place], [place], [address] Phone: [phone]   Fax:  [phone]  Occupational Therapy Evaluation  Patient Details  Name: Jessica Sanchez MRN: [id number] Date of Birth: 03-29-1986 Referring Provider: Dr. Arther Abbott   Encounter Date: 09/07/2017  OT End of Session - 09/07/17 1024    Visit Number  1    Number of Visits  5    Date for OT Re-Evaluation  10/07/17    Authorization Type  Generic Commercial-pt has no therapy benefits and will be self-pay    OT Start Time  0940    OT Stop Time  1019    OT Time Calculation (min)  39 min    Activity Tolerance  Patient tolerated treatment well    Behavior During Therapy  [facility] for tasks assessed/performed       Past Medical History:  Diagnosis Date  . Fibroid (bleeding) (uterine)   . Fibroid, uterine   . H/O metrorrhagia   . IUD (intrauterine device) in place 07/11/2015  . Menorrhagia     Past Surgical History:  Procedure Laterality Date  . HYSTEROSCOPY W/D&C N/A 09/27/2013   Procedure: DILATATION AND CURETTAGE /HYSTEROSCOPY and insertion of Mirena IUD ;  Surgeon: [name]. Harrington Challenger, MD;  Location: [facility];  Service: Gynecology;  Laterality: N/A;  . MYOMECTOMY  02/03/2011   Procedure: MYOMECTOMY;  Surgeon: [name], MD;  Location: [facility];  Service: Gynecology;  Laterality: N/A;  . TONSILLECTOMY      There were no vitals filed for this visit.  Subjective Assessment - 09/07/17 0948    Subjective   S: I finally went to the ER because it hurt so much.     Pertinent History  Pt is a 32 y/o female presenting with right shoulder pain present since approximately October. Pt went to ER in March due to severity of pain. Pt had injection on 08/18/17 wtih no relief. Pt was referred to occupational therapy for evaluation and treatment by Dr. Arther Abbott.     Special Tests  FOTO Score: 50/100    Patient Stated Goals  To have less pain and be able to use my arm.     Currently in Pain?  Yes    Pain Score  9     Pain Location  Shoulder    Pain Orientation  Right    Pain Descriptors / Indicators  Discomfort;Constant;Sore    Pain Type  Acute pain    Pain Radiating Towards  elbow and hand    Pain Onset  More than a month ago    Pain Frequency  Constant    Aggravating Factors   movement    Pain Relieving Factors  rest    Effect of Pain on Daily Activities  mod effect on B/IADLs    Multiple Pain Sites  No        [facility] OT Assessment - 09/07/17 0937      Assessment   Medical Diagnosis  right shoulder pain    Referring Provider  Dr. Arther Abbott    Onset Date/Surgical Date  01/31/18    Hand Dominance  Right    Next MD Visit  none scheduled    Prior Therapy  None      Precautions   Precautions  None      Restrictions   Weight Bearing Restrictions  No      Balance Screen   Has the patient fallen in the past 6 months  No    Has the patient had a decrease in activity level because of a fear of falling?   No    Is the patient reluctant to leave their home because of a fear of falling?   No      Prior Function   Level of Independence  Independent    Vocation  Full time employment    Leisure  works in [employer], pushing, pulling, lifting, rotating. Lots of repetitive motions      ADL   ADL comments  Pt is having difficulty with dressing, bathing, reaching overhead, performing work tasks, sleeping, meal preparation with heavy pots/pans, reaching behind back      Written Expression   Dominant Hand  Right      Cognition   Overall Cognitive Status  Within Functional Limits for tasks assessed      Observation/Other Assessments   Focus on Therapeutic Outcomes (FOTO)   50/100       ROM / Strength   AROM / PROM / Strength  AROM;PROM;Strength      Palpation   Palpation comment  mod fascial restrictions in anterior deltoid, trapezius, and scapularis regions      AROM   Overall AROM Comments  Assessed seated, er/IR adducted    AROM  Assessment Site  Shoulder    Right/Left Shoulder  Right    Right Shoulder Flexion  89 Degrees    Right Shoulder ABduction  76 Degrees    Right Shoulder Internal Rotation  90 Degrees    Right Shoulder External Rotation  79 Degrees      PROM   Overall PROM Comments  Assessed supine, er/IR adducted    PROM Assessment Site  Shoulder    Right/Left Shoulder  Right    Right Shoulder Flexion  105 Degrees    Right Shoulder ABduction  93 Degrees    Right Shoulder Internal Rotation  90 Degrees    Right Shoulder External Rotation  80 Degrees      Strength   Overall Strength Comments  Assessed seated, er/IR adducted    Strength Assessment Site  Shoulder    Right/Left Shoulder  Right    Right Shoulder Flexion  4/5    Right Shoulder ABduction  4/5    Right Shoulder Internal Rotation  5/5    Right Shoulder External Rotation  4+/5                      OT Education - 09/07/17 1020    Education provided  Yes    Education Details  scapular A/ROM, shoulder stretches    Person(s) Educated  Patient    Methods  Explanation;Demonstration;Handout    Comprehension  Verbalized understanding;Returned demonstration       OT Short Term Goals - 09/07/17 1119      OT SHORT TERM GOAL #1   Title  Pt will be provided with and educated on HEP for improved mobility of RUE during ADL tasks.     Time  4    Period  Weeks    Status  New    Target Date  10/07/17      OT SHORT TERM GOAL #2   Title  Pt will decrease fascial restrictions in RUE to minimal amounts or less to improve mobility required for functional reaching tasks.     Time  4    Period  Weeks    Status  New      OT SHORT TERM GOAL #3  Title  Pt will decrease pain in RUE to 3/10 or less to improve ability to sleep.     Time  4    Period  Weeks    Status  New      OT SHORT TERM GOAL #4   Title  Pt will improve A/ROM in RUE to [facility] to improve ability to reach up and behind her back during dressing and bathing tasks.     Time   4    Period  Weeks    Status  New      OT SHORT TERM GOAL #5   Title  Pt will improve RUE strength to 5/5 to improve ability to complete work task using RUE as dominant.     Time  4    Period  Weeks    Status  New               Plan - 09/07/17 1025    Clinical Impression Statement  A: Pt is a 32 y/o female presenting with right shoulder pain limiting ability to use RUE as dominant during ADLs and work tasks. Pt reports she is now on a 20# lifting restriction for work, however still has to use repetitive motions and works 7 days/week. Pt is self-pay therefore will come 1x/week with plans to progress to HEP only.     Occupational Profile and client history currently impacting functional performance  Pt is independent and is motivated to return to highest level of functioning during ADL and work tasks.     Occupational performance deficits (Please refer to evaluation for details):  ADL's;IADL's;Rest and Sleep;Work;Leisure    Rehab Potential  Good    OT Frequency  1x / week    OT Duration  4 weeks    OT Treatment/Interventions  Self-care/ADL training;Therapeutic exercise;Ultrasound;Manual Therapy;Therapeutic activities;Cryotherapy;Paraffin;Electrical Stimulation;Moist Heat;Passive range of motion;Patient/family education    Plan  P: Pt will benefit from skilled OT services to decrease pain and fascial restrictions and improve ROM, strength, and functional use of RUE during B/IADL tasks. Treatment plan: myofascial release, manual therapy, P/ROM, A/ROM, general RUE strengthening, scapular strengthening and stabilization, modalities prn    Clinical Decision Making  Limited treatment options, no task modification necessary    OT Home Exercise Plan  5/8: shoulder stretches and scapular A/ROM    Consulted and Agree with Plan of Care  Patient       Patient will benefit from skilled therapeutic intervention in order to improve the following deficits and impairments:  Decreased activity  tolerance, Decreased strength, Decreased range of motion, Increased edema, Pain, Increased fascial restrictions, Impaired UE functional use  Visit Diagnosis: Acute pain of right shoulder  Other symptoms and signs involving the musculoskeletal system    Problem List Patient Active Problem List   Diagnosis Date Noted  . IUD (intrauterine device) in place 07/11/2015  . Other disorder of menstruation and other abnormal bleeding from female genital tract 10/30/2012   Guadelupe Sabin, OTR/L  [phone] 09/07/2017, 12:21 PM  [place], [place], [address] Phone: [phone]   Fax:  [phone]  Name: Jessica Sanchez MRN: [id number] Date of Birth: August 26, 1985

## 2017-09-07 NOTE — Patient Instructions (Signed)
  1) Flexion Wall Stretch    Face wall, place affected handon wall in front of you. Slide hand up the wall  and lean body in towards the wall. Hold for 10 seconds. Repeat 3-5 times. 1-2 times/day.     2) Towel Stretch with Internal Rotation   Or     Gently pull up (or to the side) your affected arm  behind your back with the assist of a towel. Hold 10 seconds, repeat 3-5 times. 1-2 times/day.             3) Corner Stretch    Stand at a corner of a wall, place your arms on the walls with elbows bent. Lean into the corner until a stretch is felt along the front of your chest and/or shoulders. Hold for 10 seconds. Repeat 3-5X, 1-2 times/day.    4) Posterior Capsule Stretch    Bring the involved arm across chest. Grasp elbow and pull toward chest until you feel a stretch in the back of the upper arm and shoulder. Hold 10 seconds. Repeat 3-5X. Complete 1-2 times/day.    5) Seated Row   Sit up straight with elbows by your sides. Pull back with shoulders/elbows, keeping forearms straight, as if pulling back on the reins of a horse. Squeeze shoulder blades together. Repeat _10-15__times, _2___sets/day    6) Shoulder Elevation    Sit up straight with arms by your sides. Slowly bring your shoulders up towards your ears. Repeat_10-15__times, ___2_ sets/day    7) Shoulder Extension    Sit up straight with both arms by your side, draw your arms back behind your waist. Keep your elbows straight. Repeat _10-15___times, __2__sets/day.

## 2017-09-12 ENCOUNTER — Telehealth (HOSPITAL_COMMUNITY): Payer: Self-pay | Admitting: Family Medicine

## 2017-09-12 ENCOUNTER — Telehealth (HOSPITAL_COMMUNITY): Payer: Self-pay | Admitting: Occupational Therapy

## 2017-09-12 NOTE — Telephone Encounter (Signed)
09/12/17  Left a message to see if patient wanted to come in this week.. She is on the wait list

## 2017-09-12 NOTE — Telephone Encounter (Signed)
Patient only need one appt for this week

## 2017-09-13 ENCOUNTER — Encounter (HOSPITAL_COMMUNITY): Payer: Self-pay | Admitting: Occupational Therapy

## 2017-09-13 ENCOUNTER — Ambulatory Visit (HOSPITAL_COMMUNITY): Payer: No Typology Code available for payment source | Admitting: Occupational Therapy

## 2017-09-13 ENCOUNTER — Telehealth (HOSPITAL_COMMUNITY): Payer: Self-pay | Admitting: Family Medicine

## 2017-09-13 NOTE — Telephone Encounter (Signed)
09/13/17  worked last night and just not going to make it in... RS for 5/17

## 2017-09-16 ENCOUNTER — Encounter (HOSPITAL_COMMUNITY): Payer: Self-pay | Admitting: Occupational Therapy

## 2017-09-16 ENCOUNTER — Ambulatory Visit (HOSPITAL_COMMUNITY): Payer: No Typology Code available for payment source | Admitting: Occupational Therapy

## 2017-09-16 ENCOUNTER — Encounter

## 2017-09-16 DIAGNOSIS — M25511 Pain in right shoulder: Secondary | ICD-10-CM | POA: Diagnosis not present

## 2017-09-16 DIAGNOSIS — R29898 Other symptoms and signs involving the musculoskeletal system: Secondary | ICD-10-CM

## 2017-09-16 NOTE — Therapy (Signed)
[place], [place], [address] Phone: [phone]   Fax:  [phone]  Occupational Therapy Treatment  Patient Details  Name: Jessica Sanchez MRN: [id number] Date of Birth: 10-Dec-1985 Referring Provider: Dr. Arther Abbott   Encounter Date: 09/16/2017  OT End of Session - 09/16/17 1106    Visit Number  2    Number of Visits  5    Date for OT Re-Evaluation  10/07/17    Authorization Type  Generic Commercial-pt has no therapy benefits and will be self-pay    OT Start Time  1030    OT Stop Time  1105 pt had ride waiting    OT Time Calculation (min)  35 min    Activity Tolerance  Patient tolerated treatment well    Behavior During Therapy  [facility] for tasks assessed/performed       Past Medical History:  Diagnosis Date  . Fibroid (bleeding) (uterine)   . Fibroid, uterine   . H/O metrorrhagia   . IUD (intrauterine device) in place 07/11/2015  . Menorrhagia     Past Surgical History:  Procedure Laterality Date  . HYSTEROSCOPY W/D&C N/A 09/27/2013   Procedure: DILATATION AND CURETTAGE /HYSTEROSCOPY and insertion of Mirena IUD ;  Surgeon: [name]. Harrington Challenger, MD;  Location: [facility];  Service: Gynecology;  Laterality: N/A;  . MYOMECTOMY  02/03/2011   Procedure: MYOMECTOMY;  Surgeon: [name], MD;  Location: [facility];  Service: Gynecology;  Laterality: N/A;  . TONSILLECTOMY      There were no vitals filed for this visit.  Subjective Assessment - 09/16/17 1030    Subjective   S: I've been trying to do my exercises.     Currently in Pain?  Yes    Pain Score  10-Worst pain ever    Pain Location  Shoulder    Pain Orientation  Right    Pain Descriptors / Indicators  Aching;Constant;Sore    Pain Type  Acute pain    Pain Radiating Towards  neck    Pain Onset  More than a month ago    Pain Frequency  Constant    Aggravating Factors   movement overhead    Pain Relieving Factors  rest, pain medication    Effect of Pain on  Daily Activities  min effect on B/IADLs    Multiple Pain Sites  No         [facility] OT Assessment - 09/16/17 1030      Assessment   Medical Diagnosis  right shoulder pain      Precautions   Precautions  None               OT Treatments/Exercises (OP) - 09/16/17 1032      Exercises   Exercises  Shoulder      Shoulder Exercises: Supine   Protraction  PROM;5 reps;AROM;10 reps    Horizontal ABduction  PROM;5 reps;AROM;10 reps    External Rotation  PROM;5 reps;AROM;10 reps    Internal Rotation  PROM;5 reps;AROM;10 reps    Flexion  PROM;5 reps;AROM;10 reps    ABduction  PROM;5 reps;AROM;10 reps      Shoulder Exercises: Standing   Protraction  AROM;10 reps    Horizontal ABduction  AROM;10 reps    External Rotation  AROM;10 reps    Internal Rotation  AROM;10 reps    Flexion  AROM;10 reps    ABduction  AROM;10 reps    Extension  Theraband;10 reps  Theraband Level (Shoulder Extension)  Level 2 (Red)    Row  Theraband;10 reps    Theraband Level (Shoulder Row)  Level 2 (Red)    Retraction  Theraband;10 reps    Theraband Level (Shoulder Retraction)  Level 2 (Red)      Shoulder Exercises: ROM/Strengthening   Proximal Shoulder Strengthening, Supine  10X each no rest breaks      Manual Therapy   Manual Therapy  Soft tissue mobilization    Manual therapy comments  completed separately from therapeutic exercises    Soft tissue mobilization  myofascial release and soft tissue mobilization completed to right deltoid and trapezius regions to decrease pain and fascial restrictions and improve range of motion             OT Education - 09/16/17 1051    Education provided  Yes    Education Details  shoulder A/ROM    Person(s) Educated  Patient    Methods  Explanation;Demonstration;Handout    Comprehension  Verbalized understanding;Returned demonstration       OT Short Term Goals - 09/16/17 1049      OT SHORT TERM GOAL #1   Title  Pt will be provided with and  educated on HEP for improved mobility of RUE during ADL tasks.     Time  4    Period  Weeks    Status  On-going      OT SHORT TERM GOAL #2   Title  Pt will decrease fascial restrictions in RUE to minimal amounts or less to improve mobility required for functional reaching tasks.     Time  4    Period  Weeks    Status  On-going      OT SHORT TERM GOAL #3   Title  Pt will decrease pain in RUE to 3/10 or less to improve ability to sleep.     Time  4    Period  Weeks    Status  On-going      OT SHORT TERM GOAL #4   Title  Pt will improve A/ROM in RUE to [facility] to improve ability to reach up and behind her back during dressing and bathing tasks.     Time  4    Period  Weeks    Status  On-going      OT SHORT TERM GOAL #5   Title  Pt will improve RUE strength to 5/5 to improve ability to complete work task using RUE as dominant.     Time  4    Period  Weeks    Status  On-going               Plan - 09/16/17 1049    Clinical Impression Statement  A: pt reporting high pain today, planning to call out of work due to pain. Initiated soft tissue mobilization to reduce fascial restrictions palpated in anterior deltoid and trapezius regions. Pt with ROM WNL today, pain throughout range. Initiated A/ROM and scapular theraband exercises, verbal cuing for form and technique. Updated HEP for A/ROM and educated on alternating A/ROM and stretches.     Plan  P: Follow up on HEP, continue with scapular theraband and add to HEP. Add x to v arms and ball on wall       Patient will benefit from skilled therapeutic intervention in order to improve the following deficits and impairments:  Decreased activity tolerance, Decreased strength, Decreased range of motion, Increased edema, Pain, Increased fascial restrictions, Impaired  UE functional use  Visit Diagnosis: Acute pain of right shoulder  Other symptoms and signs involving the musculoskeletal system    Problem List Patient Active Problem  List   Diagnosis Date Noted  . IUD (intrauterine device) in place 07/11/2015  . Other disorder of menstruation and other abnormal bleeding from female genital tract 10/30/2012   Jessica Sanchez, OTR/L  [phone] 09/16/2017, 11:08 AM  [place], [place], [address] Phone: [phone]   Fax:  [phone]  Name: VICKYE ASTORINO MRN: [id number] Date of Birth: 23-Dec-1985

## 2017-09-16 NOTE — Patient Instructions (Signed)

## 2017-09-21 ENCOUNTER — Telehealth (HOSPITAL_COMMUNITY): Payer: Self-pay

## 2017-09-21 ENCOUNTER — Ambulatory Visit (HOSPITAL_COMMUNITY): Payer: No Typology Code available for payment source

## 2017-09-21 NOTE — Telephone Encounter (Signed)
Called and spoke with patient regarding no show. Patient was at work at the time of appointment and forgot. Added an appointment at end of schedule to make up for missed appointment. Patient was reminded of next appointment.   Ailene Ravel, OTR/L,CBIS  [phone]

## 2017-09-28 ENCOUNTER — Telehealth (HOSPITAL_COMMUNITY): Payer: Self-pay | Admitting: Occupational Therapy

## 2017-09-28 ENCOUNTER — Ambulatory Visit (HOSPITAL_COMMUNITY): Payer: No Typology Code available for payment source | Admitting: Occupational Therapy

## 2017-09-28 NOTE — Telephone Encounter (Signed)
Called pt regarding no-show. Pt reports she called yesterday to reschedule but did not get an answer, no message left. No appointments spots open this week when pt available, reminded of next appt on 6/5 at 10:30.    Guadelupe Sabin, OTR/L  [phone] 09/28/2017

## 2017-09-30 ENCOUNTER — Emergency Department (HOSPITAL_COMMUNITY): Payer: No Typology Code available for payment source

## 2017-09-30 ENCOUNTER — Other Ambulatory Visit: Payer: Self-pay

## 2017-09-30 ENCOUNTER — Emergency Department (HOSPITAL_COMMUNITY)
Admission: EM | Admit: 2017-09-30 | Discharge: 2017-09-30 | Disposition: A | Discharge status: Home / Self Care | Payer: No Typology Code available for payment source | Attending: Emergency Medicine | Admitting: Emergency Medicine

## 2017-09-30 ENCOUNTER — Encounter (HOSPITAL_COMMUNITY): Payer: Self-pay | Admitting: Emergency Medicine

## 2017-09-30 DIAGNOSIS — M79644 Pain in right finger(s): Secondary | ICD-10-CM | POA: Diagnosis present

## 2017-09-30 DIAGNOSIS — Z79899 Other long term (current) drug therapy: Secondary | ICD-10-CM | POA: Insufficient documentation

## 2017-09-30 MED ORDER — DICLOFENAC SODIUM 75 MG PO TBEC: 75.0000 mg | DELAYED_RELEASE_TABLET | Freq: Two times a day (BID) | ORAL | 0 refills | Status: DC

## 2017-09-30 MED ORDER — DOXYCYCLINE HYCLATE 100 MG PO CAPS: 100.0000 mg | ORAL_CAPSULE | Freq: Two times a day (BID) | ORAL | 0 refills | Status: DC

## 2017-09-30 NOTE — Discharge Instructions (Addendum)
Your x-ray is negative for fracture, dislocation, or sign of deterioration of your joints.  I think that you may have had an infection on the side of your finger called a paronychia that is spontaneously draining.  This may have also cause infection and/or inflammation involving your finger.  You will be placed on an antibiotic called doxycycline and an anti-inflammatory pain medicine called diclofenac to assist with your discomfort.  If this is not improving over the weekend, please call the hand specialist listed on your discharge instructions and set up an appointment.

## 2017-09-30 NOTE — ED Provider Notes (Signed)
[facility] EMERGENCY DEPARTMENT Provider Note   CSN: [id number] Arrival date & time: 09/30/17  1510     History   Chief Complaint Chief Complaint  Patient presents with  . Hand Pain    HPI Jessica Sanchez is a 32 y.o. female.  Patient is a 32 year old female who presents to the emergency department with a complaint of right middle finger pain.  The patient states this problem started about 3 days ago.  She noticed some mild swelling.  Present.  Patient thinks that she may have seen a mild amount of drainage or discharge from distal portion of her middle finger.  She denies any recent injury or trauma.  She denies biting nails.  She has not had any work done at a [employer] in nearly 10 years.  The patient also complains of increasing pain and difficulty bending her middle finger.  She says that she does repetitive work with her hands and is getting more and more difficult to perform her work.  No fever or chills reported.  She has not seen any red streaking or anything concerning her finger.     Past Medical History:  Diagnosis Date  . Fibroid (bleeding) (uterine)   . Fibroid, uterine   . H/O metrorrhagia   . IUD (intrauterine device) in place 07/11/2015  . Menorrhagia     Patient Active Problem List   Diagnosis Date Noted  . IUD (intrauterine device) in place 07/11/2015  . Other disorder of menstruation and other abnormal bleeding from female genital tract 10/30/2012    Past Surgical History:  Procedure Laterality Date  . HYSTEROSCOPY W/D&C N/A 09/27/2013   Procedure: DILATATION AND CURETTAGE /HYSTEROSCOPY and insertion of Mirena IUD ;  Surgeon: [name]. Harrington Challenger, MD;  Location: [facility];  Service: Gynecology;  Laterality: N/A;  . MYOMECTOMY  02/03/2011   Procedure: MYOMECTOMY;  Surgeon: [name], MD;  Location: [facility];  Service: Gynecology;  Laterality: N/A;  . TONSILLECTOMY       OB History    Gravida  1   Para      Term      Preterm      AB  1   Living       SAB  1   TAB      Ectopic      Multiple      Live Births               Home Medications    Prior to Admission medications   Medication Sig Start Date End Date Taking? Authorizing Provider  albuterol (PROVENTIL HFA) 108 (90 Base) MCG/ACT inhaler Inhale 1-2 puffs into the lungs every 6 (six) hours as needed for wheezing or shortness of breath.     [provider]  cyclobenzaprine (FLEXERIL) 10 MG tablet Take 1 tablet (10 mg total) by mouth 3 (three) times daily. 07/27/17   Lily Kocher, PA-C  diclofenac (VOLTAREN) 75 MG EC tablet Take 1 tablet (75 mg total) by mouth 2 (two) times daily with a meal. 08/18/17   Carole Civil, MD  ibuprofen (ADVIL,MOTRIN) 800 MG tablet Take 1 tablet (800 mg total) by mouth 3 (three) times daily. 07/27/17   Lily Kocher, PA-C  levonorgestrel (MIRENA) 20 MCG/24HR IUD 1 each by Intrauterine route once. EVERY 5 YEARS    [provider]  Multiple Vitamins-Calcium (ONE-A-DAY WOMENS PO) Take 1 tablet by mouth daily.    [provider]  naproxen (NAPROSYN) 500 MG tablet  Take 1 tablet (500 mg total) by mouth 2 (two) times daily. Patient not taking: Reported on 08/01/2017 05/30/17   Jeannett Senior, PA-C  oseltamivir (TAMIFLU) 75 MG capsule Take 1 capsule (75 mg total) by mouth 2 (two) times daily. Patient not taking: Reported on 08/01/2017 06/19/17   Kem Parkinson, PA-C    Family History Family History  Problem Relation Age of Onset  . Cirrhosis Mother   . Diabetes Mother   . Cancer Maternal Grandfather   . Hypertension Sister   . Diabetes Sister   . Anesthesia problems Neg Hx   . Hypotension Neg Hx   . Malignant hyperthermia Neg Hx   . Pseudochol deficiency Neg Hx     Social History Social History   Tobacco Use  . Smoking status: Never Smoker  . Smokeless tobacco: Never Used  Substance Use Topics  . Alcohol use: No  . Drug use: No     Allergies   Shellfish allergy and Adhesive [tape]   Review of  Systems Review of Systems  Constitutional: Negative for activity change.       All ROS Neg except as noted in HPI  HENT: Negative for nosebleeds.   Eyes: Negative for photophobia and discharge.  Respiratory: Negative for cough, shortness of breath and wheezing.   Cardiovascular: Negative for chest pain and palpitations.  Gastrointestinal: Negative for abdominal pain and blood in stool.  Genitourinary: Negative for dysuria, frequency and hematuria.  Musculoskeletal: Negative for arthralgias, back pain and neck pain.       Finger pain  Skin: Negative.   Neurological: Negative for dizziness, seizures and speech difficulty.  Psychiatric/Behavioral: Negative for confusion and hallucinations.     Physical Exam Updated Vital Signs BP 134/69 (BP Location: Right Arm)   Pulse 71   Temp 98.4 F (36.9 C) (Oral)   Resp 16   Ht 5\' 2"  (1.575 m)   Wt 120.2 kg (265 lb)   SpO2 99%   BMI 48.47 kg/m   Physical Exam  Constitutional: She is oriented to person, place, and time. She appears well-developed and well-nourished.  Non-toxic appearance.  HENT:  Head: Normocephalic.  Right Ear: Tympanic membrane and external ear normal.  Left Ear: Tympanic membrane and external ear normal.  Eyes: Pupils are equal, round, and reactive to light. EOM and lids are normal.  Neck: Normal range of motion. Neck supple. Carotid bruit is not present.  Cardiovascular: Normal rate, regular rhythm, normal heart sounds, intact distal pulses and normal pulses.  Pulmonary/Chest: Breath sounds normal. No respiratory distress.  Abdominal: Soft. Bowel sounds are normal. There is no tenderness. There is no guarding.  Musculoskeletal: Normal range of motion.  There is full range of motion of the right elbow and wrist.  There is pain and decreased range of motion with attempting to move the DIP, PIP, and MP joint of the right middle finger.  There is mild swelling present that starts at the area just behind the nail.  There  is a scabbed area that appears to have had a small amount of drainage prior to her arrival to the emergency department.  There are no puncture wounds noted of the palmar surface.  The radial pulses are 2+, and the capillary refill is less than 2 seconds.  Lymphadenopathy:       Head (right side): No submandibular adenopathy present.       Head (left side): No submandibular adenopathy present.    She has no cervical adenopathy.  Neurological: She is  alert and oriented to person, place, and time. She has normal strength. No cranial nerve deficit or sensory deficit.  Skin: Skin is warm and dry.  Psychiatric: She has a normal mood and affect. Her speech is normal.  Nursing note and vitals reviewed.    ED Treatments / Results  Labs (all labs ordered are listed, but only abnormal results are displayed) Labs Reviewed - No data to display  EKG None  Radiology No results found.  Procedures Procedures (including critical care time)  Medications Ordered in ED Medications - No data to display   Initial Impression / Assessment and Plan / ED Course  I have reviewed the triage vital signs and the nursing notes.  Pertinent labs & imaging results that were available during my care of the patient were reviewed by me and considered in my medical decision making (see chart for details).       Final Clinical Impressions(s) / ED Diagnoses MDM  Vital signs within normal limits.  The patient has swelling and some tenderness of the right middle finger.  The patient states that she has had a similar problem about 3 years ago at which time she had a major infection present.  The patient will receive an x-ray to evaluate the joint spaces, and to rule out a foreign body of any kind.  X-ray is negative for foreign body.  There are no evidence of any deterioration of the joints of the middle finger.  There is no air or gas noted in the finger areas.  I suspect the patient may have a spontaneously  draining paronychia.  I see no red streaks appreciated.  The finger is not hot.  There is no elevation in temperature or pulse rate.  I doubt that the patient has a septic joint or major infection.  Patient will be placed on doxycycline and anti-inflammatory medication for pain.  Patient will be asked to follow-up with Dr.Gramig if any changes or problems.   Final diagnoses:  Finger pain, right    ED Discharge Orders        Ordered    doxycycline (VIBRAMYCIN) 100 MG capsule  2 times daily     09/30/17 1706    diclofenac (VOLTAREN) 75 MG EC tablet  2 times daily     09/30/17 1706       Lily Kocher, PA-C 09/30/17 1709    Virgel Manifold, MD 10/01/17 [phone]

## 2017-09-30 NOTE — ED Triage Notes (Signed)
Pt c/o right middle finger pain and swelling x 3 days. Denies injury.

## 2017-10-02 ENCOUNTER — Emergency Department (HOSPITAL_COMMUNITY)
Admission: EM | Admit: 2017-10-02 | Discharge: 2017-10-02 | Disposition: A | Discharge status: Home / Self Care | Payer: No Typology Code available for payment source | Attending: Emergency Medicine | Admitting: Emergency Medicine

## 2017-10-02 ENCOUNTER — Encounter (HOSPITAL_COMMUNITY): Payer: Self-pay | Admitting: *Deleted

## 2017-10-02 ENCOUNTER — Other Ambulatory Visit: Payer: Self-pay

## 2017-10-02 DIAGNOSIS — Z79899 Other long term (current) drug therapy: Secondary | ICD-10-CM | POA: Diagnosis not present

## 2017-10-02 DIAGNOSIS — N390 Urinary tract infection, site not specified: Secondary | ICD-10-CM

## 2017-10-02 DIAGNOSIS — R1030 Lower abdominal pain, unspecified: Secondary | ICD-10-CM | POA: Diagnosis present

## 2017-10-02 LAB — URINALYSIS, ROUTINE W REFLEX MICROSCOPIC
Bilirubin Urine: NEGATIVE
Glucose, UA: NEGATIVE mg/dL
Ketones, ur: NEGATIVE mg/dL
Nitrite: NEGATIVE
Protein, ur: NEGATIVE mg/dL
Specific Gravity, Urine: 1.025 (ref 1.005–1.030)
pH: 6 (ref 5.0–8.0)

## 2017-10-02 LAB — CBC WITH DIFFERENTIAL/PLATELET
Basophils Absolute: 0 10*3/uL (ref 0.0–0.1)
Basophils Relative: 0 %
Eosinophils Absolute: 0.1 10*3/uL (ref 0.0–0.7)
Eosinophils Relative: 2 %
HCT: 38.7 % (ref 36.0–46.0)
Hemoglobin: 12 g/dL (ref 12.0–15.0)
Lymphocytes Relative: 42 %
Lymphs Abs: 2.7 10*3/uL (ref 0.7–4.0)
MCH: 26.3 pg (ref 26.0–34.0)
MCHC: 31 g/dL (ref 30.0–36.0)
MCV: 84.9 fL (ref 78.0–100.0)
Monocytes Absolute: 0.5 10*3/uL (ref 0.1–1.0)
Monocytes Relative: 8 %
Neutro Abs: 3.1 10*3/uL (ref 1.7–7.7)
Neutrophils Relative %: 48 %
Platelets: 305 10*3/uL (ref 150–400)
RBC: 4.56 MIL/uL (ref 3.87–5.11)
RDW: 14.5 % (ref 11.5–15.5)
WBC: 6.4 10*3/uL (ref 4.0–10.5)

## 2017-10-02 LAB — BASIC METABOLIC PANEL
Anion gap: 6 (ref 5–15)
BUN: 12 mg/dL (ref 6–20)
CO2: 27 mmol/L (ref 22–32)
Calcium: 9.3 mg/dL (ref 8.9–10.3)
Chloride: 104 mmol/L (ref 101–111)
Creatinine, Ser: 0.7 mg/dL (ref 0.44–1.00)
GFR calc Af Amer: >60 mL/min (ref >60)
GFR calc non Af Amer: >60 mL/min (ref >60)
Glucose, Bld: 94 mg/dL (ref 65–99)
Potassium: 4.1 mmol/L (ref 3.5–5.1)
Sodium: 137 mmol/L (ref 135–145)

## 2017-10-02 LAB — PREGNANCY, URINE: Preg Test, Ur: NEGATIVE

## 2017-10-02 MED ORDER — CEPHALEXIN 500 MG PO CAPS
1000.0000 mg | ORAL_CAPSULE | Freq: Once | ORAL | Status: AC
  Administered 2017-10-02: 1000 mg via ORAL
  Filled 2017-10-02: qty 2

## 2017-10-02 MED ORDER — IBUPROFEN 400 MG PO TABS
600.0000 mg | ORAL_TABLET | Freq: Once | ORAL | Status: AC
  Administered 2017-10-02: 600 mg via ORAL
  Filled 2017-10-02: qty 2

## 2017-10-02 MED ORDER — CEPHALEXIN 500 MG PO CAPS: 1000.0000 mg | ORAL_CAPSULE | Freq: Two times a day (BID) | ORAL | 0 refills | Status: DC

## 2017-10-02 MED ORDER — OXYCODONE-ACETAMINOPHEN 5-325 MG PO TABS
1.0000 | ORAL_TABLET | Freq: Once | ORAL | Status: AC
  Administered 2017-10-02: 1 via ORAL
  Filled 2017-10-02: qty 1

## 2017-10-02 NOTE — ED Triage Notes (Signed)
Pt with abd pain for past 3 days, denies N/V/D, lbm Saturday night, black in color per pt., pt denies taking iron supplements

## 2017-10-02 NOTE — ED Provider Notes (Signed)
[facility] EMERGENCY DEPARTMENT Provider Note   CSN: [id number] Arrival date & time: 10/02/17  1718     History   Chief Complaint Chief Complaint  Patient presents with  . Abdominal Pain    HPI Jessica Sanchez is a 32 y.o. female.  HPI   32 year old female with lower abdominal pain.  Intermittent over the past 3 days.  Somewhat more severe today.  She is also concerned because when she was urinating shortly before arrival she passed a large blood clot.  She is not sure if this was in her vaginal or from her urine.  She denies any dysuria.  No discharge.  She has an IUD which was placed several years ago.  She also has a past history of uterine fibroids status post myomectomy several years ago.  Past Medical History:  Diagnosis Date  . Fibroid (bleeding) (uterine)   . Fibroid, uterine   . H/O metrorrhagia   . IUD (intrauterine device) in place 07/11/2015  . Menorrhagia     Patient Active Problem List   Diagnosis Date Noted  . IUD (intrauterine device) in place 07/11/2015  . Other disorder of menstruation and other abnormal bleeding from female genital tract 10/30/2012    Past Surgical History:  Procedure Laterality Date  . HYSTEROSCOPY W/D&C N/A 09/27/2013   Procedure: DILATATION AND CURETTAGE /HYSTEROSCOPY and insertion of Mirena IUD ;  Surgeon: [name]. Harrington Challenger, MD;  Location: [facility];  Service: Gynecology;  Laterality: N/A;  . MYOMECTOMY  02/03/2011   Procedure: MYOMECTOMY;  Surgeon: [name], MD;  Location: [facility];  Service: Gynecology;  Laterality: N/A;  . TONSILLECTOMY       OB History    Gravida  1   Para      Term      Preterm      AB  1   Living        SAB  1   TAB      Ectopic      Multiple      Live Births               Home Medications    Prior to Admission medications   Medication Sig Start Date End Date Taking? Authorizing Provider  levonorgestrel (MIRENA) 20 MCG/24HR IUD 1 each by Intrauterine route once. EVERY 5 YEARS   Yes  [provider]  Multiple Vitamins-Calcium (ONE-A-DAY WOMENS PO) Take 1 tablet by mouth daily.   Yes [provider]  albuterol (PROVENTIL HFA) 108 (90 Base) MCG/ACT inhaler Inhale 1-2 puffs into the lungs every 6 (six) hours as needed for wheezing or shortness of breath.     [provider]  diclofenac (VOLTAREN) 75 MG EC tablet Take 1 tablet (75 mg total) by mouth 2 (two) times daily. 09/30/17   Lily Kocher, PA-C  doxycycline (VIBRAMYCIN) 100 MG capsule Take 1 capsule (100 mg total) by mouth 2 (two) times daily. 09/30/17   Lily Kocher, PA-C  ibuprofen (ADVIL,MOTRIN) 800 MG tablet Take 1 tablet (800 mg total) by mouth 3 (three) times daily. Patient taking differently: Take 800 mg by mouth as needed for moderate pain.  07/27/17   Lily Kocher, PA-C    Family History Family History  Problem Relation Age of Onset  . Cirrhosis Mother   . Diabetes Mother   . Cancer Maternal Grandfather   . Hypertension Sister   . Diabetes Sister   . Anesthesia problems Neg Hx   . Hypotension Neg Hx   .  Malignant hyperthermia Neg Hx   . Pseudochol deficiency Neg Hx     Social History Social History   Tobacco Use  . Smoking status: Never Smoker  . Smokeless tobacco: Never Used  Substance Use Topics  . Alcohol use: No  . Drug use: No     Allergies   Shellfish allergy and Adhesive [tape]   Review of Systems Review of Systems All systems reviewed and negative, other than as noted in HPI.   Physical Exam Updated Vital Signs BP 123/63 (BP Location: Right Arm)   Pulse 82   Temp 97.8 F (36.6 C) (Oral)   Resp 18   Ht 5\' 2"  (1.575 m)   Wt 120.2 kg (265 lb)   SpO2 97%   BMI 48.47 kg/m   Physical Exam  Constitutional: She appears well-developed and well-nourished. No distress.  HENT:  Head: Normocephalic and atraumatic.  Eyes: Conjunctivae are normal. Right eye exhibits no discharge. Left eye exhibits no discharge.  Neck: Neck supple.  Cardiovascular:  Normal rate, regular rhythm and normal heart sounds. Exam reveals no gallop and no friction rub.  No murmur heard. Pulmonary/Chest: Effort normal and breath sounds normal. No respiratory distress.  Abdominal: Soft. She exhibits no distension. There is no tenderness.  Musculoskeletal: She exhibits no edema or tenderness.  Neurological: She is alert.  Skin: Skin is warm and dry.  Psychiatric: She has a normal mood and affect. Her behavior is normal. Thought content normal.  Nursing note and vitals reviewed.    ED Treatments / Results  Labs (all labs ordered are listed, but only abnormal results are displayed) Labs Reviewed  URINE CULTURE - Abnormal; Notable for the following components:      Result Value   Culture MULTIPLE SPECIES PRESENT, SUGGEST RECOLLECTION (*)    All other components within normal limits  URINALYSIS, ROUTINE W REFLEX MICROSCOPIC - Abnormal; Notable for the following components:   APPearance HAZY (*)    Hgb urine dipstick MODERATE (*)    Leukocytes, UA SMALL (*)    Bacteria, UA RARE (*)    All other components within normal limits  CBC WITH DIFFERENTIAL/PLATELET  BASIC METABOLIC PANEL  PREGNANCY, URINE    EKG None  Radiology No results found.   Dg Finger Middle Right  Result Date: 09/30/2017 CLINICAL DATA:  Right middle finger pain and swelling.  No injury. EXAM: RIGHT MIDDLE FINGER 2+V COMPARISON:  07/26/2016. FINDINGS: No acute soft tissue bony abnormality identified. No radiopaque foreign body. No evidence of fracture dislocation. IMPRESSION: Negative exam.  No change noted from 07/26/2016. Electronically Signed   By: Marcello Moores  Register   On: 09/30/2017 16:37    Procedures Procedures (including critical care time)  Medications Ordered in ED Medications  oxyCODONE-acetaminophen (PERCOCET/ROXICET) 5-325 MG per tablet 1 tablet (1 tablet Oral Given 10/02/17 1921)  ibuprofen (ADVIL,MOTRIN) tablet 600 mg (600 mg Oral Given 10/02/17 1921)     Initial  Impression / Assessment and Plan / ED Course  I have reviewed the triage vital signs and the nursing notes.  Pertinent labs & imaging results that were available during my care of the patient were reviewed by me and considered in my medical decision making (see chart for details).     32yF with abdominal pain and n/v/d. May be viral gi illness. Abdominal exam pretty bening. Nontoxic. Afebrile. UA equivocal for UTI.   Final Clinical Impressions(s) / ED Diagnoses   Final diagnoses:  Urinary tract infection without hematuria, site unspecified    ED  Discharge Orders    None       Virgel Manifold, MD 10/05/17 1416

## 2017-10-03 ENCOUNTER — Telehealth: Payer: Self-pay | Admitting: *Deleted

## 2017-10-03 NOTE — Telephone Encounter (Signed)
Patient states she needs to make an appointment to have her IUD removed. Call transferred to front desk.

## 2017-10-04 LAB — URINE CULTURE

## 2017-10-05 ENCOUNTER — Ambulatory Visit (HOSPITAL_COMMUNITY): Payer: No Typology Code available for payment source | Admitting: Occupational Therapy

## 2017-10-07 ENCOUNTER — Ambulatory Visit (HOSPITAL_COMMUNITY): Payer: No Typology Code available for payment source | Attending: Family Medicine | Admitting: Occupational Therapy

## 2017-10-07 ENCOUNTER — Encounter (HOSPITAL_COMMUNITY): Payer: Self-pay | Admitting: Occupational Therapy

## 2017-10-07 DIAGNOSIS — R29898 Other symptoms and signs involving the musculoskeletal system: Secondary | ICD-10-CM | POA: Insufficient documentation

## 2017-10-07 DIAGNOSIS — M25511 Pain in right shoulder: Secondary | ICD-10-CM | POA: Insufficient documentation

## 2017-10-07 NOTE — Therapy (Signed)
[facility] [place], [place], [address] Phone: [phone]   Fax:  [phone]  Occupational Therapy Treatment  Patient Details  Name: Jessica Sanchez MRN: [id number] Date of Birth: August 29, 1985 Referring Provider: Dr. Arther Abbott   Encounter Date: 10/07/2017  OT End of Session - 10/07/17 1007    Visit Number  3    Number of Visits  5    Date for OT Re-Evaluation  10/07/17    Authorization Type  [employer]-pt has no therapy benefits and will be self-pay    OT Start Time  0904    OT Stop Time  0943    OT Time Calculation (min)  39 min    Activity Tolerance  Patient tolerated treatment well    Behavior During Therapy  [facility] for tasks assessed/performed       Past Medical History:  Diagnosis Date  . Fibroid (bleeding) (uterine)   . Fibroid, uterine   . H/O metrorrhagia   . IUD (intrauterine device) in place 07/11/2015  . Menorrhagia     Past Surgical History:  Procedure Laterality Date  . HYSTEROSCOPY W/D&C N/A 09/27/2013   Procedure: DILATATION AND CURETTAGE /HYSTEROSCOPY and insertion of Mirena IUD ;  Surgeon: [name]. Harrington Challenger, MD;  Location: [facility];  Service: Gynecology;  Laterality: N/A;  . MYOMECTOMY  02/03/2011   Procedure: MYOMECTOMY;  Surgeon: [name], MD;  Location: [facility];  Service: Gynecology;  Laterality: N/A;  . TONSILLECTOMY      There were no vitals filed for this visit.  Subjective Assessment - 10/07/17 0905    Subjective   S: It's really sore today after working a lot.     Currently in Pain?  Yes    Pain Score  9     Pain Location  Shoulder    Pain Orientation  Right    Pain Descriptors / Indicators  Aching;Sore    Pain Type  Acute pain    Pain Radiating Towards  neck    Pain Onset  More than a month ago    Pain Frequency  Constant    Aggravating Factors   movement overhead     Pain Relieving Factors  rest, pain medication    Effect of Pain on Daily Activities  min effect on  B/IADLs    Multiple Pain Sites  No         [facility] OT Assessment - 10/07/17 0905      Assessment   Medical Diagnosis  right shoulder pain      Precautions   Precautions  None      Observation/Other Assessments   Focus on Therapeutic Outcomes (FOTO)   70/100      Palpation   Palpation comment  min/mod fascial restrictions in anterior deltoid, trapezius, and scapularis regions      AROM   Overall AROM Comments  Assessed seated, er/IR adducted    AROM Assessment Site  Shoulder    Right/Left Shoulder  Right    Right Shoulder Flexion  135 Degrees 89 previous    Right Shoulder ABduction  141 Degrees 76 previous    Right Shoulder Internal Rotation  90 Degrees same as previous    Right Shoulder External Rotation  90 Degrees 79 previous      PROM   Overall PROM Comments  Assessed supine, er/IR adducted    PROM Assessment Site  Shoulder    Right/Left Shoulder  Right    Right Shoulder Flexion  180 Degrees 105 previous    Right Shoulder ABduction  180 Degrees 93 previous    Right Shoulder Internal Rotation  90 Degrees same as previous    Right Shoulder External Rotation  90 Degrees 80 previous      Strength   Overall Strength Comments  Assessed seated, er/IR adducted    Strength Assessment Site  Shoulder    Right/Left Shoulder  Right    Right Shoulder Flexion  4+/5 4/5 previous    Right Shoulder ABduction  4+/5 4/5 previous    Right Shoulder Internal Rotation  5/5 same as previous    Right Shoulder External Rotation  4+/5 same as previous               OT Treatments/Exercises (OP) - 10/07/17 0906      Exercises   Exercises  Shoulder      Shoulder Exercises: Supine   Protraction  PROM;5 reps;AROM;10 reps    Horizontal ABduction  PROM;5 reps;AROM;10 reps    External Rotation  PROM;5 reps;AROM;10 reps    Internal Rotation  PROM;5 reps;AROM;10 reps    Flexion  PROM;5 reps;AROM;10 reps    ABduction  PROM;5 reps;AROM;10 reps      Shoulder Exercises: Standing    Protraction  AROM;10 reps    Horizontal ABduction  AROM;10 reps    External Rotation  AROM;10 reps    Internal Rotation  AROM;10 reps    Flexion  AROM;10 reps    ABduction  AROM;10 reps    Extension  Theraband;10 reps    Theraband Level (Shoulder Extension)  Level 2 (Red)    Row  Theraband;10 reps    Theraband Level (Shoulder Row)  Level 2 (Red)    Retraction  Theraband;10 reps    Theraband Level (Shoulder Retraction)  Level 2 (Red)      Shoulder Exercises: ROM/Strengthening   UBE (Upper Arm Bike)  Level 1 3' forward 3' reverse    X to V Arms  10X    Proximal Shoulder Strengthening, Seated  10X each no rest breaks      Manual Therapy   Manual Therapy  Soft tissue mobilization    Manual therapy comments  completed separately from therapeutic exercises    Soft tissue mobilization  myofascial release and soft tissue mobilization completed to right deltoid and trapezius regions to decrease pain and fascial restrictions and improve range of motion               OT Short Term Goals - 10/07/17 1008      OT SHORT TERM GOAL #1   Title  Pt will be provided with and educated on HEP for improved mobility of RUE during ADL tasks.     Time  4    Period  Weeks    Status  On-going      OT SHORT TERM GOAL #2   Title  Pt will decrease fascial restrictions in RUE to minimal amounts or less to improve mobility required for functional reaching tasks.     Time  4    Period  Weeks    Status  On-going      OT SHORT TERM GOAL #3   Title  Pt will decrease pain in RUE to 3/10 or less to improve ability to sleep.     Time  4    Period  Weeks    Status  On-going      OT SHORT TERM GOAL #4   Title  Pt will  improve A/ROM in RUE to [facility] to improve ability to reach up and behind her back during dressing and bathing tasks.     Time  4    Period  Weeks    Status  Achieved      OT SHORT TERM GOAL #5   Title  Pt will improve RUE strength to 5/5 to improve ability to complete work task using RUE  as dominant.     Time  4    Period  Weeks    Status  Partially Met               Plan - 10/07/17 1008    Clinical Impression Statement  A: Reassessment completed today, pt has only attended her evaluation and 2 treatment sessions, including today's. Pt continues to report increased pain and swelling. During ROM measurements pt reporting she is unable to achieve full range due to pain, however on observation with exercises pt is demonstrating ROM WNL. Pt has also improved strength measurements since last session. Minimal effort expended for exercises requiring consistent cuing for completion including form and technique. 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 Discussed therapy and progress with pt and pt is agreeable to hold therapy until after she sees MD to discuss continued pain.     Plan  P: Hold therapy until after pt follow's up with MD.        Patient will benefit from skilled therapeutic intervention in order to improve the following deficits and impairments:  Decreased activity tolerance, Decreased strength, Decreased range of motion, Increased edema, Pain, Increased fascial restrictions, Impaired UE functional use  Visit Diagnosis: Acute pain of right shoulder  Other symptoms and signs involving the musculoskeletal system    Problem List Patient Active Problem List   Diagnosis Date Noted  . IUD (intrauterine device) in place 07/11/2015  . Other disorder of menstruation and other abnormal bleeding from female genital tract 10/30/2012   Guadelupe Sabin, OTR/L  [phone] 10/07/2017, 10:16 AM  [facility] [place], [place], [address] Phone: [phone]   Fax:  [phone]  Name: Jessica Sanchez MRN: [id number] Date of Birth: Sep 12, 1985

## 2017-10-11 ENCOUNTER — Encounter: Payer: Self-pay | Admitting: *Deleted

## 2017-10-11 ENCOUNTER — Ambulatory Visit (INDEPENDENT_AMBULATORY_CARE_PROVIDER_SITE_OTHER): Payer: PRIVATE HEALTH INSURANCE | Admitting: Women's Health

## 2017-10-11 ENCOUNTER — Encounter: Payer: Self-pay | Admitting: Women's Health

## 2017-10-11 VITALS — BP 98/65 | HR 78 | Ht 62.0 in | Wt 271.0 lb

## 2017-10-11 DIAGNOSIS — Z3009 Encounter for other general counseling and advice on contraception: Secondary | ICD-10-CM

## 2017-10-11 DIAGNOSIS — B372 Candidiasis of skin and nail: Secondary | ICD-10-CM

## 2017-10-11 MED ORDER — NYSTATIN POWD: 0 refills | Status: DC

## 2017-10-11 NOTE — Progress Notes (Signed)
   GYN VISIT Patient name: Jessica Sanchez MRN [id number]  Date of birth: 1985-07-07 Chief Complaint:   No chief complaint on file.  History of Present Illness:   Jessica Sanchez is a 32 y.o. [id number] African American female being seen today to discuss getting IUD removed and Nexplanon inserted.   Had IUD inserted 4years ago, has been having a lot of vaginal discharge and infections, just wants to see if IUD is causing it. Wants Nexplanon. Reports swelling of skin RLQ, then itchy rash develops, happens frequently, bad odor, uses A&D ointment which doesn't help.   No LMP recorded. (Menstrual status: IUD). The current method of family planning is IUD. Last pap 2015. Results were:  normal Review of Systems:   Pertinent items are noted in HPI Denies fever/chills, dizziness, headaches, visual disturbances, fatigue, shortness of breath, chest pain, abdominal pain, vomiting, abnormal vaginal discharge/itching/odor/irritation, problems with periods, bowel movements, urination, or intercourse unless otherwise stated above.  Pertinent History Reviewed:  Reviewed past medical,surgical, social, obstetrical and family history.  Reviewed problem list, medications and allergies. Physical Assessment:   Vitals:   10/11/17 1153  BP: 98/65  Pulse: 78  Weight: 271 lb (122.9 kg)  Height: 5\' 2"  (1.575 m)  Body mass index is 49.57 kg/m.       Physical Examination:   General appearance: alert, well appearing, and in no distress  Mental status: alert, oriented to person, place, and time  Skin: warm & dry   Cardiovascular: normal heart rate noted  Respiratory: normal respiratory effort, no distress  Abdomen: soft, non-tender, RLQ skin erythematous c/w candida- painted w/ gentian violet   Pelvic: examination not indicated  Extremities: no edema   No results found for this or any previous visit (from the past 24 hour(s)).  Assessment & Plan:  1) Contraception counseling> will make appt for IUD  removal/nexplanon insertion  2) Skin candida> painted w/ gentian violet, rx nystatin powder  3) Needs pap  Meds:  Meds ordered this encounter  Medications  . Nystatin POWD    Sig: Apply to affected area 4 times daily as needed    Dispense:  1 Bottle    Refill:  0    Order Specific Question:   Supervising Provider    Answer:   Elonda Husky, LUTHER H [[id number]]    No orders of the defined types were placed in this encounter.   Return for 1st available for pap & physical, then IUD removal & Nexplanon insertion.  Simms, [facility] 10/11/2017 12:27 PM

## 2017-10-12 ENCOUNTER — Encounter (HOSPITAL_COMMUNITY): Payer: Self-pay | Admitting: Occupational Therapy

## 2017-10-14 ENCOUNTER — Ambulatory Visit: Payer: PRIVATE HEALTH INSURANCE | Admitting: Orthopedic Surgery

## 2017-10-19 ENCOUNTER — Ambulatory Visit: Payer: PRIVATE HEALTH INSURANCE | Admitting: Orthopedic Surgery

## 2017-10-19 ENCOUNTER — Encounter (HOSPITAL_COMMUNITY): Payer: Self-pay | Admitting: Occupational Therapy

## 2017-10-23 ENCOUNTER — Emergency Department (HOSPITAL_COMMUNITY)
Admission: EM | Admit: 2017-10-23 | Discharge: 2017-10-23 | Disposition: A | Discharge status: Home / Self Care | Payer: No Typology Code available for payment source | Attending: Emergency Medicine | Admitting: Emergency Medicine

## 2017-10-23 ENCOUNTER — Emergency Department (HOSPITAL_COMMUNITY): Payer: No Typology Code available for payment source

## 2017-10-23 ENCOUNTER — Encounter (HOSPITAL_COMMUNITY): Payer: Self-pay | Admitting: Emergency Medicine

## 2017-10-23 ENCOUNTER — Other Ambulatory Visit: Payer: Self-pay

## 2017-10-23 DIAGNOSIS — R51 Headache: Secondary | ICD-10-CM | POA: Insufficient documentation

## 2017-10-23 DIAGNOSIS — H9203 Otalgia, bilateral: Secondary | ICD-10-CM | POA: Insufficient documentation

## 2017-10-23 DIAGNOSIS — R202 Paresthesia of skin: Secondary | ICD-10-CM | POA: Diagnosis not present

## 2017-10-23 DIAGNOSIS — R072 Precordial pain: Secondary | ICD-10-CM | POA: Diagnosis not present

## 2017-10-23 DIAGNOSIS — R0600 Dyspnea, unspecified: Secondary | ICD-10-CM | POA: Diagnosis not present

## 2017-10-23 DIAGNOSIS — H9202 Otalgia, left ear: Secondary | ICD-10-CM

## 2017-10-23 LAB — CBC WITH DIFFERENTIAL/PLATELET
BASOS ABS: 0 10*3/uL (ref 0.0–0.1)
Basophils Relative: 1 %
Eosinophils Absolute: 0.1 10*3/uL (ref 0.0–0.7)
Eosinophils Relative: 2 %
HEMATOCRIT: 39.3 % (ref 36.0–46.0)
HEMOGLOBIN: 12.3 g/dL (ref 12.0–15.0)
Lymphocytes Relative: 44 %
Lymphs Abs: 2.4 10*3/uL (ref 0.7–4.0)
MCH: 26.9 pg (ref 26.0–34.0)
MCHC: 31.3 g/dL (ref 30.0–36.0)
MCV: 86 fL (ref 78.0–100.0)
Monocytes Absolute: 0.5 10*3/uL (ref 0.1–1.0)
Monocytes Relative: 8 %
NEUTROS ABS: 2.5 10*3/uL (ref 1.7–7.7)
Neutrophils Relative %: 45 %
Platelets: 271 10*3/uL (ref 150–400)
RBC: 4.57 MIL/uL (ref 3.87–5.11)
RDW: 14.9 % (ref 11.5–15.5)
WBC: 5.6 10*3/uL (ref 4.0–10.5)

## 2017-10-23 LAB — BASIC METABOLIC PANEL
Anion gap: 6 (ref 5–15)
BUN: 11 mg/dL (ref 6–20)
CO2: 25 mmol/L (ref 22–32)
Calcium: 8.8 mg/dL — ABNORMAL LOW (ref 8.9–10.3)
Chloride: 108 mmol/L (ref 101–111)
Creatinine, Ser: 0.61 mg/dL (ref 0.44–1.00)
GFR calc Af Amer: >60 mL/min (ref >60)
Glucose, Bld: 86 mg/dL (ref 65–99)
Potassium: 4.1 mmol/L (ref 3.5–5.1)
Sodium: 139 mmol/L (ref 135–145)

## 2017-10-23 LAB — I-STAT BETA HCG BLOOD, ED (MC, WL, AP ONLY)

## 2017-10-23 LAB — D-DIMER, QUANTITATIVE: D-Dimer, Quant: 0.42 ug/mL-FEU (ref 0.00–0.50)

## 2017-10-23 LAB — I-STAT TROPONIN, ED: Troponin i, poc: 0 ng/mL (ref 0.00–0.08)

## 2017-10-23 MED ORDER — IPRATROPIUM-ALBUTEROL 0.5-2.5 (3) MG/3ML IN SOLN
3.0000 mL | Freq: Once | RESPIRATORY_TRACT | Status: AC
  Administered 2017-10-23: 3 mL via RESPIRATORY_TRACT
  Filled 2017-10-23: qty 3

## 2017-10-23 MED ORDER — KETOROLAC TROMETHAMINE 30 MG/ML IJ SOLN
30.0000 mg | Freq: Once | INTRAMUSCULAR | Status: AC
  Administered 2017-10-23: 30 mg via INTRAVENOUS
  Filled 2017-10-23: qty 1

## 2017-10-23 MED ORDER — NEOMYCIN-POLYMYXIN-HC 3.5-10000-1 OT SUSP: 4.0000 [drp] | Freq: Three times a day (TID) | OTIC | 0 refills | Status: DC

## 2017-10-23 NOTE — ED Triage Notes (Signed)
Patient c/o bilateral ear pain. Per patient drainage from left ear that started on Wednesday and bilateral ear pain started on Friday. Unsure of any fevers or sore throat. Per patient headache and also c/o chest discomfort. Denies cough. Per patient sharp, non-radiating, mid sternal chest pain with shortness of breath. Reports using inhaler prior to coming to ED.

## 2017-10-23 NOTE — Discharge Instructions (Signed)
You have been seen in the Emergency Department (ED) today for chest pain.  As we have discussed today?s test results are normal, but you may require further testing.  Please follow up with the recommended doctor as instructed above in these documents regarding today?s emergent visit and your recent symptoms to discuss further management.  Continue to take your regular medications.   Call the Neurologist tomorrow to arrange an outpatient appointment to address the intermittent right arm tingling.   Return to the Emergency Department (ED) if you experience any further chest pain/pressure/tightness, difficulty breathing, or sudden sweating, or other symptoms that concern you.   Chest Pain (Nonspecific) It is often hard to give a specific diagnosis for the cause of chest pain. There is always a chance that your pain could be related to something serious, such as a heart attack or a blood clot in the lungs. You need to follow up with your health care provider for further evaluation. CAUSES  Heartburn. Pneumonia or bronchitis. Anxiety or stress. Inflammation around your heart (pericarditis) or lung (pleuritis or pleurisy). A blood clot in the lung. A collapsed lung (pneumothorax). It can develop suddenly on its own (spontaneous pneumothorax) or from trauma to the chest. Shingles infection (herpes zoster virus). The chest wall is composed of bones, muscles, and cartilage. Any of these can be the source of the pain. The bones can be bruised by injury. The muscles or cartilage can be strained by coughing or overwork. The cartilage can be affected by inflammation and become sore (costochondritis). DIAGNOSIS  Lab tests or other studies may be needed to find the cause of your pain. Your health care provider may have you take a test called an ambulatory electrocardiogram (ECG). An ECG records your heartbeat patterns over a 24-hour period. You may also have other tests, such as: Transthoracic  echocardiogram (TTE). During echocardiography, sound waves are used to evaluate how blood flows through your heart. Transesophageal echocardiogram (TEE). Cardiac monitoring. This allows your health care provider to monitor your heart rate and rhythm in real time. Holter monitor. This is a portable device that records your heartbeat and can help diagnose heart arrhythmias. It allows your health care provider to track your heart activity for several days, if needed. Stress tests by exercise or by giving medicine that makes the heart beat faster. TREATMENT  Treatment depends on what may be causing your chest pain. Treatment may include: Acid blockers for heartburn. Anti-inflammatory medicine. Pain medicine for inflammatory conditions. Antibiotics if an infection is present. You may be advised to change lifestyle habits. This includes stopping smoking and avoiding alcohol, caffeine, and chocolate. You may be advised to keep your head raised (elevated) when sleeping. This reduces the chance of acid going backward from your stomach into your esophagus. Most of the time, nonspecific chest pain will improve within 2-3 days with rest and mild pain medicine.  HOME CARE INSTRUCTIONS  If antibiotics were prescribed, take them as directed. Finish them even if you start to feel better. For the next few days, avoid physical activities that bring on chest pain. Continue physical activities as directed. Do not use any tobacco products, including cigarettes, chewing tobacco, or electronic cigarettes. Avoid drinking alcohol. Only take medicine as directed by your health care provider. Follow your health care provider's suggestions for further testing if your chest pain does not go away. Keep any follow-up appointments you made. If you do not go to an appointment, you could develop lasting (chronic) problems with pain. If there  is any problem keeping an appointment, call to reschedule. SEEK MEDICAL CARE IF:  Your  chest pain does not go away, even after treatment. You have a rash with blisters on your chest. You have a fever. SEEK IMMEDIATE MEDICAL CARE IF:  You have increased chest pain or pain that spreads to your arm, neck, jaw, back, or abdomen. You have shortness of breath. You have an increasing cough, or you cough up blood. You have severe back or abdominal pain. You feel nauseous or vomit. You have severe weakness. You faint. You have chills. This is an emergency. Do not wait to see if the pain will go away. Get medical help at once. Call your local emergency services (911 in [place] Do not drive yourself to the hospital. MAKE SURE YOU:  Understand these instructions. Will watch your condition. Will get help right away if you are not doing well or get worse. Document Released: 01/27/2005 Document Revised: 04/24/2013 Document Reviewed: 11/23/2007 [facility] Patient Information 2015 [place], [place]. This information is not intended to replace advice given to you by your health care provider. Make sure you discuss any questions you have with your health care provider.

## 2017-10-23 NOTE — Progress Notes (Signed)
Patient being taken to x-ray at this time. T Treatment to be administered upon her return.

## 2017-10-23 NOTE — ED Provider Notes (Signed)
Emergency Department Provider Note   I have reviewed the triage vital signs and the nursing notes.   HISTORY  Chief Complaint Otalgia   HPI Jessica Sanchez is a 32 y.o. female with PMH of fibroids's to the emergency department for evaluation of bilateral ear pain with drainage from the left ear and intermittent sharp substernal chest pain.  Symptoms have been ongoing for the past 2 to 3 days.  She reports some associated shortness of breath with her sharp chest pain and feels it is difficult for her to get a breath in.  She does have an inhaler at home she is tried using with no significant relief in symptoms.  Her chest pain is intermittent and nonradiating.  No past history of DVT or PE.  The patient states her ear pain is bilateral but worse on the left.  She is noticed some yellowish discharge from the ear which was worse last night.  No fevers or chills.  She does have an associated headache. No productive cough or hemoptysis. No neck pain/stiffness.   Past Medical History:  Diagnosis Date  . Fibroid (bleeding) (uterine)   . Fibroid, uterine   . H/O metrorrhagia   . IUD (intrauterine device) in place 07/11/2015  . Menorrhagia     Patient Active Problem List   Diagnosis Date Noted  . IUD (intrauterine device) in place 07/11/2015  . Other disorder of menstruation and other abnormal bleeding from female genital tract 10/30/2012    Past Surgical History:  Procedure Laterality Date  . HYSTEROSCOPY W/D&C N/A 09/27/2013   Procedure: DILATATION AND CURETTAGE /HYSTEROSCOPY and insertion of Mirena IUD ;  Surgeon: [name]. Harrington Challenger, MD;  Location: [facility];  Service: Gynecology;  Laterality: N/A;  . MYOMECTOMY  02/03/2011   Procedure: MYOMECTOMY;  Surgeon: [name], MD;  Location: [facility];  Service: Gynecology;  Laterality: N/A;  . TONSILLECTOMY      Allergies Shellfish allergy and Adhesive [tape]  Family History  Problem Relation Age of Onset  . Cirrhosis Mother   . Diabetes  Mother   . Cancer Maternal Grandfather   . Hypertension Sister   . Diabetes Sister   . Anesthesia problems Neg Hx   . Hypotension Neg Hx   . Malignant hyperthermia Neg Hx   . Pseudochol deficiency Neg Hx     Social History Social History   Tobacco Use  . Smoking status: Never Smoker  . Smokeless tobacco: Never Used  Substance Use Topics  . Alcohol use: No  . Drug use: No    Review of Systems  Constitutional: No fever/chills Eyes: No visual changes. ENT: No sore throat. Positive bilateral ear pain and left ear drainage.  Cardiovascular: Positive chest pain. Respiratory: Positive shortness of breath. Gastrointestinal: No abdominal pain.  No nausea, no vomiting.  No diarrhea.  No constipation. Genitourinary: Negative for dysuria. Musculoskeletal: Negative for back pain. Skin: Negative for rash. Neurological: Negative for focal weakness or numbness. Positive HA.   10-point ROS otherwise negative.  ____________________________________________   PHYSICAL EXAM:  VITAL SIGNS: ED Triage Vitals  Enc Vitals Group     BP 10/23/17 1310 122/76     Pulse Rate 10/23/17 1310 76     Resp 10/23/17 1310 18     Temp 10/23/17 1310 (!) 97.5 F (36.4 C)     Temp Source 10/23/17 1310 Oral     SpO2 10/23/17 1310 100 %     Weight 10/23/17 1311 271 lb (122.9 kg)  Height 10/23/17 1311 5\' 2"  (1.575 m)     Pain Score 10/23/17 1311 10   Constitutional: Alert and oriented. Well appearing and in no acute distress. Eyes: Conjunctivae are normal.  Head: Atraumatic. Ears:  Opaque TMs bilaterally without erythema or bulging. No fluid level appreciated. Left ear canal with mild erythema and faint discharge.  Nose: No congestion/rhinnorhea. Mouth/Throat: Mucous membranes are moist.  Neck: No stridor. Cardiovascular: Normal rate, regular rhythm. Good peripheral circulation. Grossly normal heart sounds.   Respiratory: Normal respiratory effort.  No retractions. Lungs CTAB. Gastrointestinal:  Soft and nontender. No distention.  Musculoskeletal: No lower extremity tenderness nor edema. No gross deformities of extremities. Neurologic:  Normal speech and language. No gross focal neurologic deficits are appreciated.  Skin:  Skin is warm, dry and intact. No rash noted.  ____________________________________________   LABS (all labs ordered are listed, but only abnormal results are displayed)  Labs Reviewed  BASIC METABOLIC PANEL - Abnormal; Notable for the following components:      Result Value   Calcium 8.8 (*)    All other components within normal limits  CBC WITH DIFFERENTIAL/PLATELET  D-DIMER, QUANTITATIVE (NOT AT [facility])  I-STAT TROPONIN, ED  I-STAT BETA HCG BLOOD, ED ([facility], [facility], [facility] ONLY)   ____________________________________________  EKG   EKG Interpretation  Date/Time:  Sunday October 23 2017 13:36:46 EDT Ventricular Rate:  73 PR Interval:    QRS Duration: 92 QT Interval:  386 QTC Calculation: 426 R Axis:   23 Text Interpretation:  Sinus rhythm Borderline T abnormalities, anterior leads No STEMI.  Confirmed by Nanda Quinton [phone]) on 10/23/2017 1:39:56 PM       ____________________________________________  RADIOLOGY  Dg Chest 2 View  Result Date: 10/23/2017 CLINICAL DATA:  32 year old female with history of bilateral ear pain for the past 5 days. Chest discomfort. EXAM: CHEST - 2 VIEW COMPARISON:  Chest x-ray 05/30/2017. FINDINGS: Lung volumes are low. No consolidative airspace disease. No pleural effusions. No pneumothorax. No pulmonary nodule or mass noted. Pulmonary vasculature appears mildly engorged, likely accentuated by low lung volumes, without frank pulmonary edema. Cardiomediastinal silhouette are within normal limits. IMPRESSION: 1. Low lung volumes without radiographic evidence of acute cardiopulmonary disease. Electronically Signed   By: Vinnie Langton M.D.   On: 10/23/2017 14:34     ____________________________________________   PROCEDURES  Procedure(s) performed:   Procedures  None  ____________________________________________   INITIAL IMPRESSION / ASSESSMENT AND PLAN / ED COURSE  Pertinent labs & imaging results that were available during my care of the patient were reviewed by me and considered in my medical decision making (see chart for details).  Patient presents to the emergency department with multiple complaints including bilateral ear discomfort with drainage from the left ear.  There may be a mild otitis externa on the left.  Plan for topical drops for home use and PCP follow-up.  In terms of the patient's chest pain is atypical in terms of ACS.  Some increase suspicion for PE but seems more related to possible bronchitis.  Doubt pneumonia.  Plan for labs including troponin and d-dimer along with chest x-ray.  Plan for Toradol for headache discomfort.  No concern for infectious etiology of headache.  Labs and chest x-ray reviewed with no acute findings.  EKG unremarkable.  D-dimer negative.  Patient mentions to me at the time of discharge that she is having some tingling in the right arm.  She states she has this intermittently typically happens when she begins to feel anxious  or overwhelmed.  No leg or face symptoms.  No weakness or numbness on my exam.  I advised her to follow-up with the primary care physician regarding this and provided contact information for local neurology. Will treat otitis external.   At this time, I do not feel there is any life-threatening condition present. I have reviewed and discussed all results (EKG, imaging, lab, urine as appropriate), exam findings with patient. I have reviewed nursing notes and appropriate previous records.  I feel the patient is safe to be discharged home without further emergent workup. Discussed usual and customary return precautions. Patient and family (if present) verbalize understanding and are  comfortable with this plan.  Patient will follow-up with their primary care provider. If they do not have a primary care provider, information for follow-up has been provided to them. All questions have been answered.  ____________________________________________  FINAL CLINICAL IMPRESSION(S) / ED DIAGNOSES  Final diagnoses:  Otalgia of left ear  Precordial chest pain  Dyspnea, unspecified type  Paresthesia of right arm     MEDICATIONS GIVEN DURING THIS VISIT:  Medications  ipratropium-albuterol (DUONEB) 0.5-2.5 (3) MG/3ML nebulizer solution 3 mL (3 mLs Nebulization Given 10/23/17 1403)  ketorolac (TORADOL) 30 MG/ML injection 30 mg (30 mg Intravenous Given 10/23/17 1424)     NEW OUTPATIENT MEDICATIONS STARTED DURING THIS VISIT:  Discharge Medication List as of 10/23/2017  3:00 PM    START taking these medications   Details  neomycin-polymyxin-hydrocortisone (CORTISPORIN) 3.5-10000-1 OTIC suspension Place 4 drops into the left ear 3 (three) times daily., Starting Sun 10/23/2017, Print        Note:  This document was prepared using Dragon voice recognition software and may include unintentional dictation errors.  Nanda Quinton, MD Emergency Medicine    Long, W[facility], MD 10/23/17 [phone]

## 2017-10-24 ENCOUNTER — Ambulatory Visit (INDEPENDENT_AMBULATORY_CARE_PROVIDER_SITE_OTHER): Payer: No Typology Code available for payment source

## 2017-10-24 ENCOUNTER — Encounter: Payer: Self-pay | Admitting: Orthopedic Surgery

## 2017-10-24 ENCOUNTER — Ambulatory Visit (INDEPENDENT_AMBULATORY_CARE_PROVIDER_SITE_OTHER): Payer: No Typology Code available for payment source | Admitting: Orthopedic Surgery

## 2017-10-24 VITALS — BP 124/85 | HR 82 | Ht 62.0 in | Wt 270.0 lb

## 2017-10-24 DIAGNOSIS — M25511 Pain in right shoulder: Secondary | ICD-10-CM

## 2017-10-24 DIAGNOSIS — M75101 Unspecified rotator cuff tear or rupture of right shoulder, not specified as traumatic: Secondary | ICD-10-CM

## 2017-10-24 DIAGNOSIS — M542 Cervicalgia: Secondary | ICD-10-CM

## 2017-10-24 MED ORDER — METHOCARBAMOL 500 MG PO TABS: 500.0000 mg | ORAL_TABLET | Freq: Three times a day (TID) | ORAL | 1 refills | Status: DC

## 2017-10-24 NOTE — Progress Notes (Signed)
Follow-up visit  Chief Complaint  Patient presents with  . Shoulder Pain    right shoulder c/o pain and knot on right shoulder did not progress in therapy     32 year old female presented to [place] in April works for [employer] complained of dull right shoulder pain radiating to the cervical spine down to the elbow with decreased range of motion for 2 months.  Our initial shoulder x-ray was normal  We did inject her subacromial space she continued on ibuprofen she presents back now with worsening pain in the shoulder and cervical spine with some tingling just down to the shoulder no real improvements in her overall range of motion she is having trouble with night pain and swelling in the right shoulder as well.  Her physical therapy was canceled for the last 2 visits because the therapist was worried about the muscle spasms in the shoulder and neck  Review of Systems  Musculoskeletal: Positive for neck pain.  Neurological: Positive for tingling.    Current Outpatient Medications:  .  albuterol (PROVENTIL HFA) 108 (90 Base) MCG/ACT inhaler, Inhale 1-2 puffs into the lungs every 6 (six) hours as needed for wheezing or shortness of breath. , Disp: , Rfl:  .  levonorgestrel (MIRENA) 20 MCG/24HR IUD, 1 each by Intrauterine route once. EVERY 5 YEARS, Disp: , Rfl:  .  Multiple Vitamins-Calcium (ONE-A-DAY WOMENS PO), Take 1 tablet by mouth daily., Disp: , Rfl:  .  diclofenac (VOLTAREN) 75 MG EC tablet, Take 1 tablet (75 mg total) by mouth 2 (two) times daily. (Patient not taking: Reported on 10/11/2017), Disp: 12 tablet, Rfl: 0 .  neomycin-polymyxin-hydrocortisone (CORTISPORIN) 3.5-10000-1 OTIC suspension, Place 4 drops into the left ear 3 (three) times daily. (Patient not taking: Reported on 10/24/2017), Disp: 10 mL, Rfl: 0 .  Nystatin POWD, Apply to affected area 4 times daily as needed (Patient not taking: Reported on 10/23/2017), Disp: 1 Bottle, Rfl: 0 BP 124/85   Pulse 82   Ht 5\' 2"  (1.575 m)    Wt 270 lb (122.5 kg)   BMI 49.38 kg/m  Physical Exam  Constitutional: She is oriented to person, place, and time. She appears well-developed and well-nourished.  Musculoskeletal:       Back:       Arms: Neurological: She is alert and oriented to person, place, and time.  Psychiatric: She has a normal mood and affect. Judgment normal.  Vitals reviewed.  Cervical spine was x-rayed and it shows loss of cervical lordosis with straightening of the cervical spine abnormal mild arthritis in the cervical spine.  Plan MRI right shoulder rule out rotator cuff tear and plan treatment if cuff tear is present surgery further nonoperative treatment  She will need to continue lifting restriction I need to add a muscle relaxer to help with the spasms  Meds ordered this encounter  Medications  . methocarbamol (ROBAXIN) 500 MG tablet    Sig: Take 1 tablet (500 mg total) by mouth 3 (three) times daily.    Dispense:  60 tablet    Refill:  1

## 2017-10-24 NOTE — Patient Instructions (Signed)
Extend work restrictions 3 weeks

## 2017-10-26 ENCOUNTER — Telehealth: Payer: Self-pay | Admitting: Radiology

## 2017-10-26 NOTE — Telephone Encounter (Signed)
Mail box is full I called again, to let her know I need insurance card to get MRI approval. When I contacted number on the [facility] card was told to contact the insurance company for prior authorization

## 2017-10-27 ENCOUNTER — Telehealth: Payer: Self-pay | Admitting: Radiology

## 2017-10-27 NOTE — Telephone Encounter (Signed)
Still unable to reach regarding need of insurance card.

## 2017-10-31 ENCOUNTER — Telehealth: Payer: Self-pay | Admitting: Orthopedic Surgery

## 2017-10-31 NOTE — Telephone Encounter (Signed)
Patient left message on voicemail wanting to know if we had heard anything from her insurance about scheduling her MRI.  Please call and advise

## 2017-10-31 NOTE — Telephone Encounter (Signed)
I can not do this without her insurance card, she has not provided this to [place], all I have is a debit card for [facility] but they indicate to call the insurance company for prior authorization. I have told her if we need prior authorization and do not get it she will have to pay for the entire scan out of pocket, and I do not want her stuck with a huge bill. I have asked her to check with her human resources and find out what she needs to do.

## 2017-11-09 ENCOUNTER — Other Ambulatory Visit: Payer: PRIVATE HEALTH INSURANCE | Admitting: Women's Health

## 2017-11-10 ENCOUNTER — Ambulatory Visit: Payer: PRIVATE HEALTH INSURANCE | Admitting: Women's Health

## 2017-11-16 ENCOUNTER — Telehealth: Payer: Self-pay | Admitting: Radiology

## 2017-11-16 NOTE — Telephone Encounter (Signed)
Patient has declined to provide me her insurance information for precertification of the MRI scan. I spoke to her several times and even asked her to discuss with her human resources person  The card she gave in the office was a flex spending card, and when I called number on the card they indicated I need to call the insurance carrier, but there is no indication of who the insurance carrier is.  To you FYI, I can not proceed with scheduling the scan, as patient refuses to provide insurance information.

## 2017-11-19 ENCOUNTER — Encounter (HOSPITAL_COMMUNITY): Payer: Self-pay | Admitting: Emergency Medicine

## 2017-11-19 ENCOUNTER — Emergency Department (HOSPITAL_COMMUNITY)
Admission: EM | Admit: 2017-11-19 | Discharge: 2017-11-19 | Disposition: A | Discharge status: Home / Self Care | Payer: No Typology Code available for payment source | Attending: Emergency Medicine | Admitting: Emergency Medicine

## 2017-11-19 ENCOUNTER — Emergency Department (HOSPITAL_COMMUNITY): Payer: No Typology Code available for payment source

## 2017-11-19 ENCOUNTER — Other Ambulatory Visit: Payer: Self-pay

## 2017-11-19 DIAGNOSIS — Z79899 Other long term (current) drug therapy: Secondary | ICD-10-CM | POA: Insufficient documentation

## 2017-11-19 DIAGNOSIS — I471 Supraventricular tachycardia: Secondary | ICD-10-CM | POA: Insufficient documentation

## 2017-11-19 DIAGNOSIS — R079 Chest pain, unspecified: Secondary | ICD-10-CM | POA: Diagnosis present

## 2017-11-19 LAB — BASIC METABOLIC PANEL
ANION GAP: 7 (ref 5–15)
BUN: 10 mg/dL (ref 6–20)
CO2: 23 mmol/L (ref 22–32)
Calcium: 9 mg/dL (ref 8.9–10.3)
Chloride: 107 mmol/L (ref 98–111)
Creatinine, Ser: 0.72 mg/dL (ref 0.44–1.00)
GFR calc Af Amer: >60 mL/min (ref >60)
GLUCOSE: 98 mg/dL (ref 70–99)
Potassium: 3.8 mmol/L (ref 3.5–5.1)
Sodium: 137 mmol/L (ref 135–145)

## 2017-11-19 LAB — PREGNANCY, URINE: Preg Test, Ur: NEGATIVE

## 2017-11-19 LAB — CBC
HCT: 41.3 % (ref 36.0–46.0)
HEMOGLOBIN: 13.5 g/dL (ref 12.0–15.0)
MCH: 27.5 pg (ref 26.0–34.0)
MCHC: 32.7 g/dL (ref 30.0–36.0)
MCV: 84.1 fL (ref 78.0–100.0)
PLATELETS: 308 10*3/uL (ref 150–400)
RBC: 4.91 MIL/uL (ref 3.87–5.11)
RDW: 14.9 % (ref 11.5–15.5)
WBC: 5.1 10*3/uL (ref 4.0–10.5)

## 2017-11-19 LAB — D-DIMER, QUANTITATIVE: D-Dimer, Quant: 0.45 ug/mL-FEU (ref 0.00–0.50)

## 2017-11-19 LAB — MAGNESIUM: Magnesium: 2.1 mg/dL (ref 1.7–2.4)

## 2017-11-19 LAB — TSH: TSH: 2.295 u[IU]/mL (ref 0.350–4.500)

## 2017-11-19 LAB — TROPONIN I

## 2017-11-19 MED ORDER — METOPROLOL TARTRATE 5 MG/5ML IV SOLN
5.0000 mg | Freq: Once | INTRAVENOUS | Status: AC
  Administered 2017-11-19: 5 mg via INTRAVENOUS
  Filled 2017-11-19: qty 5

## 2017-11-19 MED ORDER — DILTIAZEM LOAD VIA INFUSION
15.0000 mg | Freq: Once | INTRAVENOUS | Status: AC
  Administered 2017-11-19: 15 mg via INTRAVENOUS
  Filled 2017-11-19: qty 15

## 2017-11-19 MED ORDER — DILTIAZEM HCL-DEXTROSE 100-5 MG/100ML-% IV SOLN (PREMIX)
5.0000 mg/h | INTRAVENOUS | Status: DC
  Administered 2017-11-19: 5 mg/h via INTRAVENOUS
  Filled 2017-11-19: qty 100

## 2017-11-19 MED ORDER — ADENOSINE 6 MG/2ML IV SOLN
6.0000 mg | Freq: Once | INTRAVENOUS | Status: AC
  Administered 2017-11-19: 6 mg via INTRAVENOUS
  Filled 2017-11-19: qty 2

## 2017-11-19 MED ORDER — METOPROLOL TARTRATE 25 MG PO TABS: 25.0000 mg | ORAL_TABLET | Freq: Two times a day (BID) | ORAL | 0 refills | Status: DC | PRN

## 2017-11-19 MED ORDER — METOPROLOL TARTRATE 25 MG PO TABS
25.0000 mg | ORAL_TABLET | Freq: Once | ORAL | Status: AC
  Administered 2017-11-19: 25 mg via ORAL
  Filled 2017-11-19: qty 1

## 2017-11-19 MED ORDER — SODIUM CHLORIDE 0.9 % IV BOLUS
1000.0000 mL | Freq: Once | INTRAVENOUS | Status: AC
  Administered 2017-11-19: 1000 mL via INTRAVENOUS

## 2017-11-19 MED ORDER — ADENOSINE 6 MG/2ML IV SOLN
12.0000 mg | Freq: Once | INTRAVENOUS | Status: AC
  Administered 2017-11-19: 12 mg via INTRAVENOUS
  Filled 2017-11-19: qty 4

## 2017-11-19 NOTE — ED Provider Notes (Signed)
Pt seen by Dr. Melina Copa initially.  Please see his note.  Plan was to see if her tachycardia resolved with the metoprolol doses.  Unfortunately patient still remains tachycardic in the 130s.  I will start her on a Cardizem infusion.  I will consult with the medical service for admission.  D/w Dr Nehemiah Settle.  Requests discussing with cardiology for possible admission by them.  Pt does not have any other medical problems  Discussed with Dr Raiford Simmonds.  Would like to try another dose of metoprolol.  Her tachycardia has decreased prior to the 2nd metoprolol.  The cardizem bolus got her down to the 80s but repeat ECG is a junctional rhythm  D/w Dr Raiford Simmonds.  Will watch in ED a couple more hours. 25 mg po bid metoprolol to go home with.  Can take an extra if she needs to.  Follow up with cardiology.  7:51 PM Pt has converted back to sinus rhythm.     EKG Interpretation  Date/Time:  Saturday November 19 2017 19:42:45 EDT Ventricular Rate:  80 PR Interval:    QRS Duration: 102 QT Interval:  372 QTC Calculation: 430 R Axis:   24 Text Interpretation:  Sinus rhythm Nonspecific T abnormalities, anterior leads sinus rhythm has resplaced juntional rhythm Confirmed by Dorie Rank [phone]) on 11/19/2017 7:51:38 PM      Ready for dc home.   Dorie Rank, MD 11/19/17 [phone]

## 2017-11-19 NOTE — ED Triage Notes (Signed)
Patient c/o left side chest pain that radiates into back. Per patient started yesterday and is progressively getting worse. Per patient nausea, dizziness, and weakness. Denies any cardiac hx.

## 2017-11-19 NOTE — ED Provider Notes (Signed)
[facility] EMERGENCY DEPARTMENT Provider Note   CSN: [id number] Arrival date & time: 11/19/17  1215     History   Chief Complaint Chief Complaint  Patient presents with  . Chest Pain    HPI Jessica Sanchez is a 32 y.o. female.  She is complaining of about 5 days of vague chest discomfort and feeling of fast heart rate.  She thinks is been on and off but is been progressive since yesterday.  Is associated with some nausea some lightheadedness and some feeling weak all over.  She has not had any cough.  She does feel short of breath at times.  She cannot think of any precipitating factors.  Of note she states she had a similar thing a few years ago of a fast heart rate but she does not know what the cause was.  She has not been any stimulants and specifically denies any cocaine.  She had a history of fibroids and blood loss at one point but she has not had a period in over 5 months.  She is using an IUD.  The history is provided by the patient.  Palpitations   This is a new problem. Episode onset: 5 days. The problem occurs constantly. The problem has not changed since onset.Associated symptoms include chest pain, near-syncope, dizziness, weakness and shortness of breath. Pertinent negatives include no fever, no syncope, no abdominal pain, no nausea, no vomiting, no leg pain, no lower extremity edema, no hemoptysis and no sputum production. She has tried nothing for the symptoms. There are no known risk factors. Her past medical history is significant for anemia.    Past Medical History:  Diagnosis Date  . Fibroid (bleeding) (uterine)   . Fibroid, uterine   . H/O metrorrhagia   . IUD (intrauterine device) in place 07/11/2015  . Menorrhagia     Patient Active Problem List   Diagnosis Date Noted  . IUD (intrauterine device) in place 07/11/2015  . Other disorder of menstruation and other abnormal bleeding from female genital tract 10/30/2012    Past Surgical History:  Procedure  Laterality Date  . HYSTEROSCOPY W/D&C N/A 09/27/2013   Procedure: DILATATION AND CURETTAGE /HYSTEROSCOPY and insertion of Mirena IUD ;  Surgeon: [name]. Harrington Challenger, MD;  Location: [facility];  Service: Gynecology;  Laterality: N/A;  . MYOMECTOMY  02/03/2011   Procedure: MYOMECTOMY;  Surgeon: [name], MD;  Location: [facility];  Service: Gynecology;  Laterality: N/A;  . TONSILLECTOMY       OB History    Gravida  1   Para      Term      Preterm      AB  1   Living        SAB  1   TAB      Ectopic      Multiple      Live Births               Home Medications    Prior to Admission medications   Medication Sig Start Date End Date Taking? Authorizing Provider  albuterol (PROVENTIL HFA) 108 (90 Base) MCG/ACT inhaler Inhale 1-2 puffs into the lungs every 6 (six) hours as needed for wheezing or shortness of breath.     [provider]  diclofenac (VOLTAREN) 75 MG EC tablet Take 1 tablet (75 mg total) by mouth 2 (two) times daily. Patient not taking: Reported on 10/11/2017 09/30/17   Lily Kocher, PA-C  levonorgestrel (MIRENA) 20  MCG/24HR IUD 1 each by Intrauterine route once. EVERY 5 YEARS    [provider]  methocarbamol (ROBAXIN) 500 MG tablet Take 1 tablet (500 mg total) by mouth 3 (three) times daily. 10/24/17   Carole Civil, MD  Multiple Vitamins-Calcium (ONE-A-DAY WOMENS PO) Take 1 tablet by mouth daily.    [provider]  neomycin-polymyxin-hydrocortisone (CORTISPORIN) 3.5-10000-1 OTIC suspension Place 4 drops into the left ear 3 (three) times daily. Patient not taking: Reported on 10/24/2017 10/23/17   Margette Fast, MD  Nystatin POWD Apply to affected area 4 times daily as needed Patient not taking: Reported on 10/23/2017 10/11/17   Roma Schanz, CNM    Family History Family History  Problem Relation Age of Onset  . Cirrhosis Mother   . Diabetes Mother   . Cancer Maternal Grandfather   . Hypertension Sister   . Diabetes Sister    . Anesthesia problems Neg Hx   . Hypotension Neg Hx   . Malignant hyperthermia Neg Hx   . Pseudochol deficiency Neg Hx     Social History Social History   Tobacco Use  . Smoking status: Never Smoker  . Smokeless tobacco: Never Used  Substance Use Topics  . Alcohol use: No  . Drug use: No     Allergies   Shellfish allergy and Adhesive [tape]   Review of Systems Review of Systems  Constitutional: Negative for fever.  HENT: Negative for sore throat.   Eyes: Negative for pain and visual disturbance.  Respiratory: Positive for shortness of breath. Negative for hemoptysis and sputum production.   Cardiovascular: Positive for chest pain, palpitations and near-syncope. Negative for syncope.  Gastrointestinal: Negative for abdominal pain, nausea and vomiting.  Genitourinary: Negative for dysuria.  Musculoskeletal: Negative for neck pain.  Skin: Negative for rash.  Neurological: Positive for dizziness and weakness.     Physical Exam Updated Vital Signs BP 112/74   Pulse (!) 144   Resp 19   Ht 5\' 2"  (1.575 m)   Wt 122.9 kg (271 lb)   SpO2 99%   BMI 49.57 kg/m   Physical Exam  Constitutional: She appears well-developed and well-nourished. No distress.  HENT:  Head: Normocephalic and atraumatic.  Eyes: Conjunctivae are normal.  Neck: Neck supple.  Cardiovascular: Regular rhythm and normal pulses. Tachycardia present.  No murmur heard. Pulmonary/Chest: Effort normal and breath sounds normal. No respiratory distress.  Abdominal: Soft. There is no tenderness.  Musculoskeletal: Normal range of motion. She exhibits no edema, tenderness or deformity.  Neurological: She is alert.  Skin: Skin is warm and dry. Capillary refill takes less than 2 seconds.  Psychiatric: She has a normal mood and affect.  Nursing note and vitals reviewed.    ED Treatments / Results  Labs (all labs ordered are listed, but only abnormal results are displayed) Labs Reviewed  BASIC  METABOLIC PANEL  CBC  TROPONIN I  PREGNANCY, URINE  TSH  MAGNESIUM  D-DIMER, QUANTITATIVE (NOT AT [facility])    EKG EKG Interpretation  Date/Time:  Saturday November 19 2017 12:23:36 EDT Ventricular Rate:  144 PR Interval:    QRS Duration: 79 QT Interval:  312 QTC Calculation: 483 R Axis:   42 Text Interpretation:  Junctional tachycardia Nonspecific T abnormalities, anterior leads Borderline prolonged QT interval increased rate from prior  Confirmed by Aletta Edouard [phone]) on 11/19/2017 12:48:39 PM   Radiology Dg Chest 2 View  Result Date: 11/19/2017 CLINICAL DATA:  CHEST PAIN, PER ER NOTE, Patient c/o left  side chest pain that radiates into back. Per patient started yesterday and is progressively getting worse. Per patient nausea, dizziness, and weakness. Denies any cardiac hx. NO OTHER HISTORY DOCUMENTED EXAM: CHEST - 2 VIEW COMPARISON:  10/23/2017 FINDINGS: Low lung volumes with crowding of bronchovascular structures. Can't exclude pulmonary vascular congestion. No focal infiltrate or overt edema. Heart size and mediastinal contours are within normal limits. No effusion.  No pneumothorax. Visualized bones unremarkable. IMPRESSION: Low volumes.  No acute cardiopulmonary disease. Electronically Signed   By: Lucrezia Europe M.D.   On: 11/19/2017 13:44    Procedures .Cardioversion Date/Time: 11/19/2017 3:15 PM Performed by: Hayden Rasmussen, MD Authorized by: Hayden Rasmussen, MD   Consent:    Consent obtained:  Verbal   Consent given by:  Patient   Risks discussed:  Induced arrhythmia and pain   Alternatives discussed:  No treatment, rate-control medication, alternative treatment, observation and delayed treatment Pre-procedure details:    Cardioversion basis:  Elective   Rhythm:  Supraventricular tachycardia   Electrode placement:  Anterior-posterior Patient sedated: No Post-procedure details:    Patient status:  Awake   Patient tolerance of procedure:  Tolerated well, no  immediate complications Comments:     Patient was attempted cardioversion with adenosine 6 mg followed by 12 mg of adenosine    .Critical Care Performed by: Hayden Rasmussen, MD Authorized by: Hayden Rasmussen, MD   Critical care provider statement:    Critical care time (minutes):  40   Critical care time was exclusive of:  Separately billable procedures and treating other patients   Critical care was necessary to treat or prevent imminent or life-threatening deterioration of the following conditions:  Circulatory failure and cardiac failure   Critical care was time spent personally by me on the following activities:  Development of treatment plan with patient or surrogate, discussions with consultants, evaluation of patient's response to treatment, examination of patient, obtaining history from patient or surrogate, ordering and performing treatments and interventions, ordering and review of laboratory studies, ordering and review of radiographic studies, pulse oximetry, re-evaluation of patient's condition and review of old charts   I assumed direction of critical care for this patient from another provider in my specialty: no     (including critical care time)  Medications Ordered in ED Medications  sodium chloride 0.9 % bolus 1,000 mL (0 mLs Intravenous Stopped 11/19/17 1400)  adenosine (ADENOCARD) 6 MG/2ML injection 6 mg (6 mg Intravenous Given 11/19/17 1437)  adenosine (ADENOCARD) 6 MG/2ML injection 12 mg (12 mg Intravenous Given 11/19/17 1504)  metoprolol tartrate (LOPRESSOR) injection 5 mg (5 mg Intravenous Given 11/19/17 1611)  diltiazem (CARDIZEM) 1 mg/mL load via infusion 15 mg (15 mg Intravenous Bolus from Bag 11/19/17 1735)  metoprolol tartrate (LOPRESSOR) injection 5 mg (5 mg Intravenous Given 11/19/17 1753)  metoprolol tartrate (LOPRESSOR) tablet 25 mg (25 mg Oral Given 11/19/17 1907)     Initial Impression / Assessment and Plan / ED Course  I have reviewed the triage vital  signs and the nursing notes.  Pertinent labs & imaging results that were available during my care of the patient were reviewed by me and considered in my medical decision making (see chart for details).  Clinical Course as of Nov 21 1623  Sat Nov 19, 2017  1255 Reviewed prior work-ups.  It looks like back in 2017 the patient was here for tachycardia and converted with adenosine.  Possible SVT versus flutter on today's EKG, it March is up  very regular.  She is in no distress so we will get some labs to do some fluid challenge but likely she may need an adenosine to see what her native rhythm is   [MB]  1424 Patient's lab work is been otherwise unrevealing she remains to be in a narrow complex tachycardia at about 135.  I recommended that we proceed with giving her a dose of adenosine and she is agreeable to plan.   [MB]  5615 Patient received 6 of adenosine while on cardiac monitor with pacer pads.  she had a transient break into sinus but then sped back up again.  She does not have great P amplitude but there did not show any obvious flutter waves.   [MB]  1543 12 adenosine with similar unsuccessful.  There are no clear flutter waves   [MB]  1605 Discussed with Dr. Claiborne Billings from cardiology.  He feels this is still likely SVT versus an AVNRT and not A. Fib, his recommendation is to try some IV Lopressor to rate control her and she may drop back into sinus rhythm.   [MB]    Clinical Course User Index [MB] Hayden Rasmussen, MD         Final Clinical Impressions(s) / ED Diagnoses   Final diagnoses:  AVNRT (AV nodal re-entry tachycardia) [facility])    ED Discharge Orders    None       Hayden Rasmussen, MD 11/20/17 1807

## 2017-11-27 ENCOUNTER — Emergency Department (HOSPITAL_COMMUNITY): Payer: No Typology Code available for payment source

## 2017-11-27 ENCOUNTER — Emergency Department (HOSPITAL_COMMUNITY)
Admission: EM | Admit: 2017-11-27 | Discharge: 2017-11-27 | Disposition: A | Discharge status: Home / Self Care | Payer: No Typology Code available for payment source | Attending: Emergency Medicine | Admitting: Emergency Medicine

## 2017-11-27 ENCOUNTER — Encounter (HOSPITAL_COMMUNITY): Payer: Self-pay | Admitting: Emergency Medicine

## 2017-11-27 DIAGNOSIS — R071 Chest pain on breathing: Secondary | ICD-10-CM | POA: Insufficient documentation

## 2017-11-27 DIAGNOSIS — D649 Anemia, unspecified: Secondary | ICD-10-CM | POA: Diagnosis not present

## 2017-11-27 DIAGNOSIS — R0789 Other chest pain: Secondary | ICD-10-CM | POA: Insufficient documentation

## 2017-11-27 DIAGNOSIS — R079 Chest pain, unspecified: Secondary | ICD-10-CM | POA: Diagnosis present

## 2017-11-27 DIAGNOSIS — R002 Palpitations: Secondary | ICD-10-CM | POA: Diagnosis not present

## 2017-11-27 DIAGNOSIS — Z79899 Other long term (current) drug therapy: Secondary | ICD-10-CM | POA: Insufficient documentation

## 2017-11-27 LAB — CBC
HEMATOCRIT: 35.7 % — AB (ref 36.0–46.0)
Hemoglobin: 11.4 g/dL — ABNORMAL LOW (ref 12.0–15.0)
MCH: 27.2 pg (ref 26.0–34.0)
MCHC: 31.9 g/dL (ref 30.0–36.0)
MCV: 85.2 fL (ref 78.0–100.0)
PLATELETS: 253 10*3/uL (ref 150–400)
RBC: 4.19 MIL/uL (ref 3.87–5.11)
RDW: 15 % (ref 11.5–15.5)
WBC: 6.2 10*3/uL (ref 4.0–10.5)

## 2017-11-27 LAB — TROPONIN I: Troponin I: <0.03 ng/mL (ref <0.03)

## 2017-11-27 MED ORDER — CYCLOBENZAPRINE HCL 10 MG PO TABS
10.0000 mg | ORAL_TABLET | Freq: Once | ORAL | Status: AC
  Administered 2017-11-27: 10 mg via ORAL
  Filled 2017-11-27: qty 1

## 2017-11-27 MED ORDER — ASPIRIN 81 MG PO CHEW
324.0000 mg | CHEWABLE_TABLET | Freq: Once | ORAL | Status: AC
  Administered 2017-11-27: 324 mg via ORAL
  Filled 2017-11-27: qty 4

## 2017-11-27 NOTE — ED Provider Notes (Signed)
[facility] EMERGENCY DEPARTMENT Provider Note   CSN: [id number] Arrival date & time: 11/27/17  1455     History   Chief Complaint Chief Complaint  Patient presents with  . Chest Pain    HPI Jessica Sanchez is a 32 y.o. female.  HPI 32 year old female presents today with chest pain began loc about midnight and is sharp in nature.  Pain was present for several hours resolved and began again today.  Currently 10/10- increases with deep breath and feels palpitations.  Took lopressor 25 mg today as given prn tachycardia.  No dyspnea, nausea, vomiting, fever chills.  Multiple similar symptoms in past.  No significant pmh.  Sister admitted by me last night for chest pain.  Past Medical History:  Diagnosis Date  . Fibroid (bleeding) (uterine)   . Fibroid, uterine   . H/O metrorrhagia   . IUD (intrauterine device) in place 07/11/2015  . Menorrhagia     Patient Active Problem List   Diagnosis Date Noted  . IUD (intrauterine device) in place 07/11/2015  . Other disorder of menstruation and other abnormal bleeding from female genital tract 10/30/2012    Past Surgical History:  Procedure Laterality Date  . HYSTEROSCOPY W/D&C N/A 09/27/2013   Procedure: DILATATION AND CURETTAGE /HYSTEROSCOPY and insertion of Mirena IUD ;  Surgeon: [name]. Harrington Challenger, MD;  Location: [facility];  Service: Gynecology;  Laterality: N/A;  . MYOMECTOMY  02/03/2011   Procedure: MYOMECTOMY;  Surgeon: [name], MD;  Location: [facility];  Service: Gynecology;  Laterality: N/A;  . TONSILLECTOMY       OB History    Gravida  1   Para      Term      Preterm      AB  1   Living        SAB  1   TAB      Ectopic      Multiple      Live Births               Home Medications    Prior to Admission medications   Medication Sig Start Date End Date Taking? Authorizing Provider  metoprolol tartrate (LOPRESSOR) 25 MG tablet Take 1 tablet (25 mg total) by mouth 2 (two) times daily as needed  (tachycardia, you can also take one extra dose if needed for persistent tachcyardia). 11/19/17 12/19/17 Yes Dorie Rank, MD  albuterol (PROVENTIL HFA) 108 (90 Base) MCG/ACT inhaler Inhale 1-2 puffs into the lungs every 6 (six) hours as needed for wheezing or shortness of breath.     [provider]  diclofenac (VOLTAREN) 75 MG EC tablet Take 1 tablet (75 mg total) by mouth 2 (two) times daily. Patient not taking: Reported on 10/11/2017 09/30/17   Lily Kocher, PA-C  levonorgestrel (MIRENA) 20 MCG/24HR IUD 1 each by Intrauterine route once. EVERY 5 YEARS    [provider]  methocarbamol (ROBAXIN) 500 MG tablet Take 1 tablet (500 mg total) by mouth 3 (three) times daily. Patient taking differently: Take 500 mg by mouth daily as needed.  10/24/17   Carole Civil, MD  Multiple Vitamins-Calcium (ONE-A-DAY WOMENS PO) Take 1 tablet by mouth daily.    [provider]  neomycin-polymyxin-hydrocortisone (CORTISPORIN) 3.5-10000-1 OTIC suspension Place 4 drops into the left ear 3 (three) times daily. Patient not taking: Reported on 10/24/2017 10/23/17   Long, Wonda Olds, MD  Nystatin POWD Apply to affected area 4 times daily as needed Patient not  taking: Reported on 10/23/2017 10/11/17   Roma Schanz, CNM    Family History Family History  Problem Relation Age of Onset  . Cirrhosis Mother   . Diabetes Mother   . Cancer Maternal Grandfather   . Hypertension Sister   . Diabetes Sister   . Anesthesia problems Neg Hx   . Hypotension Neg Hx   . Malignant hyperthermia Neg Hx   . Pseudochol deficiency Neg Hx     Social History Social History   Tobacco Use  . Smoking status: Never Smoker  . Smokeless tobacco: Never Used  Substance Use Topics  . Alcohol use: Yes    Comment: occasional  . Drug use: No     Allergies   Shellfish allergy and Adhesive [tape]   Review of Systems Review of Systems  All other systems reviewed and are negative.    Physical  Exam Updated Vital Signs BP 117/63   Pulse 84   Temp 98.5 F (36.9 C) (Oral)   Resp 18   Ht 1.575 m (5\' 2" )   Wt 122.9 kg (271 lb)   SpO2 98%   BMI 49.57 kg/m   Physical Exam  Constitutional: She is oriented to person, place, and time. She appears well-developed and well-nourished. She does not appear ill.  HENT:  Head: Normocephalic.  Eyes: Pupils are equal, round, and reactive to light. EOM are normal.  Neck: Normal range of motion. Neck supple.  Cardiovascular: Normal rate, regular rhythm, intact distal pulses and normal pulses.  Pulmonary/Chest: Effort normal and breath sounds normal.  Abdominal: Soft. Bowel sounds are normal.  Musculoskeletal: Normal range of motion.       Right lower leg: Normal.       Left lower leg: Normal.  Neurological: She is alert and oriented to person, place, and time.  Skin: Skin is warm and dry. Capillary refill takes less than 2 seconds.  Psychiatric: She has a normal mood and affect. Her behavior is normal.  Nursing note and vitals reviewed.    ED Treatments / Results  Labs (all labs ordered are listed, but only abnormal results are displayed) Labs Reviewed  CBC  TROPONIN I    EKG EKG Interpretation  Date/Time:  Sunday November 27 2017 14:58:33 EDT Ventricular Rate:  79 PR Interval:    QRS Duration: 89 QT Interval:  375 QTC Calculation: 430 R Axis:   29 Text Interpretation:  Sinus rhythm Borderline T abnormalities, anterior leads No significant change since last tracing Confirmed by Pattricia Boss [phone]) on 11/27/2017 3:16:52 PM   Radiology Dg Chest 2 View  Result Date: 11/27/2017 CLINICAL DATA:  Chest pain, shortness of breath EXAM: CHEST - 2 VIEW COMPARISON:  11/19/2017 FINDINGS: Low lung volumes. This accentuates heart size and vascular markings. No confluent opacities or effusions. No acute bony abnormality. IMPRESSION: Low lung volumes.  No acute findings. Electronically Signed   By: Rolm Baptise M.D.   On: 11/27/2017 16:29     Procedures Procedures (including critical care time)  Medications Ordered in ED Medications  aspirin chewable tablet 324 mg (has no administration in time range)     Initial Impression / Assessment and Plan / ED Course  I have reviewed the triage vital signs and the nursing notes.  Pertinent labs & imaging results that were available during my care of the patient were reviewed by me and considered in my medical decision making (see chart for details).  Clinical Course as of Nov 28 1622  Sun Nov 27, 2017  1600 Reviewed and compared to prior.  Patient has decreased hemoglobin relative first prior but has had this level in the past.  Patient advised regarding need for follow-up  Hemoglobin(!): 11.4 [DR]    Clinical Course User Index [DR] Pattricia Boss, MD   32 year old female presents today with chest pain that is reproducible on exam.  Hemoglobin without any acute ischemic changes.  Labs obtained and shows mild anemia.  Patient advised regarding return precautions and need for follow-up and voices understanding.  Final Clinical Impressions(s) / ED Diagnoses   Final diagnoses:  Chest wall pain  Anemia, unspecified type    ED Discharge Orders    None       Pattricia Boss, MD 11/27/17 1652

## 2017-11-27 NOTE — ED Triage Notes (Signed)
PT states she has been having constant chest pain since yesterday.  Also c/o lightheadedness and sob.

## 2017-11-27 NOTE — Discharge Instructions (Signed)
These recheck with your doctor next week regarding your anemia.  Your hemoglobin is 11.4 today. Return if you have worsening pain or new symptoms such as shortness of breath or fever

## 2017-11-29 ENCOUNTER — Encounter (HOSPITAL_COMMUNITY): Payer: Self-pay | Admitting: Occupational Therapy

## 2017-11-29 NOTE — Therapy (Signed)
[place], [place], [address] Phone: [phone]   Fax:  [phone]  Patient Details  Name: Jessica Sanchez MRN: [id number] Date of Birth: 1986/04/21 Referring Provider:  No ref. provider found  Encounter Date: 11/29/2017   OCCUPATIONAL THERAPY DISCHARGE SUMMARY  Visits from Start of Care: 3  Current functional level related to goals / functional outcomes: Unknown. Pt has not returned or called with status update since was placed on therapy hold on 10/07/2017. Per chart review MD has ordered MRI however pt is declining to provide insurance card for authorization to schedule.    Remaining deficits: unknown   Education / Equipment: HEP on evaluation Plan: Patient agrees to discharge.  Patient goals were not met. Patient is being discharged due to not returning since the last visit.  ?????       Guadelupe Sabin, OTR/L  [phone] 11/29/2017, 10:53 AM  [place], [place], [address] Phone: [phone]   Fax:  [phone]

## 2018-01-21 ENCOUNTER — Other Ambulatory Visit: Payer: Self-pay

## 2018-01-21 ENCOUNTER — Encounter (HOSPITAL_COMMUNITY): Payer: Self-pay | Admitting: Emergency Medicine

## 2018-01-21 ENCOUNTER — Emergency Department (HOSPITAL_COMMUNITY)
Admission: EM | Admit: 2018-01-21 | Discharge: 2018-01-22 | Disposition: A | Discharge status: Home / Self Care | Payer: No Typology Code available for payment source | Attending: Emergency Medicine | Admitting: Emergency Medicine

## 2018-01-21 DIAGNOSIS — M79604 Pain in right leg: Secondary | ICD-10-CM | POA: Diagnosis not present

## 2018-01-21 DIAGNOSIS — Z79899 Other long term (current) drug therapy: Secondary | ICD-10-CM | POA: Insufficient documentation

## 2018-01-21 DIAGNOSIS — R2243 Localized swelling, mass and lump, lower limb, bilateral: Secondary | ICD-10-CM | POA: Diagnosis not present

## 2018-01-21 DIAGNOSIS — M79605 Pain in left leg: Secondary | ICD-10-CM | POA: Diagnosis not present

## 2018-01-21 DIAGNOSIS — R600 Localized edema: Secondary | ICD-10-CM

## 2018-01-21 NOTE — ED Provider Notes (Signed)
[facility] EMERGENCY DEPARTMENT Provider Note   CSN: [id number] Arrival date & time: 01/21/18  2245     History   Chief Complaint Chief Complaint  Patient presents with  . Ankle Pain    HPI Jessica Sanchez is a 32 y.o. female.  Patient is a 32 year old female with history of uterine fibroids and obesity.  She presents with complaints of bilateral leg pain and swelling, the left greater than the right.  This is been ongoing for the past several days and began in the absence of any injury or trauma.  She denies any fevers or chills.  She denies any chest pain or shortness of breath.  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  The history is provided by the patient.  Ankle Pain   Incident onset: Several days ago. The incident occurred at work. There was no injury mechanism. The quality of the pain is described as aching. The pain is moderate. The pain has been constant since onset. Pertinent negatives include no numbness, no inability to bear weight and no loss of sensation. The symptoms are aggravated by activity and bearing weight. She has tried nothing for the symptoms.    Past Medical History:  Diagnosis Date  . Fibroid (bleeding) (uterine)   . Fibroid, uterine   . H/O metrorrhagia   . IUD (intrauterine device) in place 07/11/2015  . Menorrhagia     Patient Active Problem List   Diagnosis Date Noted  . IUD (intrauterine device) in place 07/11/2015  . Other disorder of menstruation and other abnormal bleeding from female genital tract 10/30/2012    Past Surgical History:  Procedure Laterality Date  . HYSTEROSCOPY W/D&C N/A 09/27/2013   Procedure: DILATATION AND CURETTAGE /HYSTEROSCOPY and insertion of Mirena IUD ;  Surgeon: [name]. Harrington Challenger, MD;  Location: [facility];  Service: Gynecology;  Laterality: N/A;  . MYOMECTOMY  02/03/2011   Procedure: MYOMECTOMY;  Surgeon: [name], MD;  Location: [facility];   Service: Gynecology;  Laterality: N/A;  . TONSILLECTOMY       OB History    Gravida  1   Para      Term      Preterm      AB  1   Living        SAB  1   TAB      Ectopic      Multiple      Live Births               Home Medications    Prior to Admission medications   Medication Sig Start Date End Date Taking? Authorizing Provider  albuterol (PROVENTIL HFA) 108 (90 Base) MCG/ACT inhaler Inhale 1-2 puffs into the lungs every 6 (six) hours as needed for wheezing or shortness of breath.     [provider]  diclofenac (VOLTAREN) 75 MG EC tablet Take 1 tablet (75 mg total) by mouth 2 (two) times daily. Patient not taking: Reported on 10/11/2017 09/30/17   Lily Kocher, PA-C  levonorgestrel (MIRENA) 20 MCG/24HR IUD 1 each by Intrauterine route once. EVERY 5 YEARS    [provider]  methocarbamol (ROBAXIN) 500 MG tablet Take 1 tablet (500 mg total) by mouth 3 (three) times daily. Patient taking differently: Take 500 mg by mouth daily as needed.  10/24/17   Carole Civil, MD  metoprolol tartrate (LOPRESSOR) 25 MG tablet Take  1 tablet (25 mg total) by mouth 2 (two) times daily as needed (tachycardia, you can also take one extra dose if needed for persistent tachcyardia). 11/19/17 12/19/17  Dorie Rank, MD  Multiple Vitamins-Calcium (ONE-A-DAY WOMENS PO) Take 1 tablet by mouth daily.    [provider]  neomycin-polymyxin-hydrocortisone (CORTISPORIN) 3.5-10000-1 OTIC suspension Place 4 drops into the left ear 3 (three) times daily. Patient not taking: Reported on 10/24/2017 10/23/17   Margette Fast, MD  Nystatin POWD Apply to affected area 4 times daily as needed Patient not taking: Reported on 10/23/2017 10/11/17   Roma Schanz, CNM    Family History Family History  Problem Relation Age of Onset  . Cirrhosis Mother   . Diabetes Mother   . Cancer Maternal Grandfather   . Hypertension Sister   . Diabetes Sister   . Anesthesia problems  Neg Hx   . Hypotension Neg Hx   . Malignant hyperthermia Neg Hx   . Pseudochol deficiency Neg Hx     Social History Social History   Tobacco Use  . Smoking status: Never Smoker  . Smokeless tobacco: Never Used  Substance Use Topics  . Alcohol use: Yes    Comment: occasional  . Drug use: No     Allergies   Shellfish allergy and Adhesive [tape]   Review of Systems Review of Systems  Neurological: Negative for numbness.  All other systems reviewed and are negative.    Physical Exam Updated Vital Signs BP 108/76 (BP Location: Right Arm)   Pulse 86   Temp 98.3 F (36.8 C) (Oral)   Resp 20   Ht 5\' 2"  (1.575 m)   Wt 121.1 kg   SpO2 100%   BMI 48.83 kg/m   Physical Exam  Constitutional: She is oriented to person, place, and time. She appears well-developed and well-nourished. No distress.  HENT:  Head: Normocephalic and atraumatic.  Neck: Normal range of motion. Neck supple.  Cardiovascular: Normal rate and regular rhythm. Exam reveals no gallop and no friction rub.  No murmur heard. Pulmonary/Chest: Effort normal and breath sounds normal. No respiratory distress. She has no wheezes. She has no rales.  Abdominal: Soft. Bowel sounds are normal. She exhibits no distension. There is no tenderness.  Musculoskeletal: Normal range of motion. She exhibits edema.  There is 2+ pitting edema of the left lower extremity and 1+ pitting edema of the right lower extremity.  There is no calf tenderness and Homans sign is absent bilaterally.  DP pulses are easily palpable.  Motor and sensation are intact throughout both feet.    Neurological: She is alert and oriented to person, place, and time.  Skin: Skin is warm and dry. She is not diaphoretic.  Nursing note and vitals reviewed.    ED Treatments / Results  Labs (all labs ordered are listed, but only abnormal results are displayed) Labs Reviewed  BASIC METABOLIC PANEL  CBC WITH DIFFERENTIAL/PLATELET  D-DIMER,  QUANTITATIVE (NOT AT [facility])  I-STAT TROPONIN, ED    EKG EKG Interpretation  Date/Time:  Saturday January 21 2018 23:47:03 EDT Ventricular Rate:  81 PR Interval:    QRS Duration: 88 QT Interval:  370 QTC Calculation: 430 R Axis:   33 Text Interpretation:  Sinus rhythm Borderline T abnormalities, anterior leads Confirmed by Veryl Speak [phone]) on 01/21/2018 11:56:13 PM   Radiology No results found.  Procedures Procedures (including critical care time)  Medications Ordered in ED Medications - No data to display   Initial Impression /  Assessment and Plan / ED Course  I have reviewed the triage vital signs and the nursing notes.  Pertinent labs & imaging results that were available during my care of the patient were reviewed by me and considered in my medical decision making (see chart for details).  Patient with leg swelling that is most likely related to dependent edema.  She is not experiencing any shortness of breath and I doubt an acute cardiac cause.  Also considered is DVT, however this is bilateral and d-dimer is negative.  I will make arrangements for an outpatient [place] to completely rule this out.  Will prescribe lasix, feet elevation, prn follow up.  Final Clinical Impressions(s) / ED Diagnoses   Final diagnoses:  None    ED Discharge Orders    None       Veryl Speak, MD 01/22/18 [phone]

## 2018-01-21 NOTE — ED Triage Notes (Addendum)
Pt c/o left ankle and leg swelling due to "working too much" since Friday , pt states is having some right ankle swelling as well

## 2018-01-22 LAB — BASIC METABOLIC PANEL
ANION GAP: 8 (ref 5–15)
BUN: 12 mg/dL (ref 6–20)
CALCIUM: 8.9 mg/dL (ref 8.9–10.3)
CO2: 22 mmol/L (ref 22–32)
Chloride: 108 mmol/L (ref 98–111)
Creatinine, Ser: 0.67 mg/dL (ref 0.44–1.00)
GLUCOSE: 110 mg/dL — AB (ref 70–99)
POTASSIUM: 3.8 mmol/L (ref 3.5–5.1)
SODIUM: 138 mmol/L (ref 135–145)

## 2018-01-22 LAB — I-STAT TROPONIN, ED: Troponin i, poc: 0 ng/mL (ref 0.00–0.08)

## 2018-01-22 LAB — CBC WITH DIFFERENTIAL/PLATELET
BASOS ABS: 0 10*3/uL (ref 0.0–0.1)
BASOS PCT: 0 %
Eosinophils Absolute: 0.1 10*3/uL (ref 0.0–0.7)
Eosinophils Relative: 2 %
HEMATOCRIT: 36.1 % (ref 36.0–46.0)
HEMOGLOBIN: 11.5 g/dL — AB (ref 12.0–15.0)
LYMPHS PCT: 39 %
Lymphs Abs: 2.6 10*3/uL (ref 0.7–4.0)
MCH: 27.3 pg (ref 26.0–34.0)
MCHC: 31.9 g/dL (ref 30.0–36.0)
MCV: 85.7 fL (ref 78.0–100.0)
MONO ABS: 0.5 10*3/uL (ref 0.1–1.0)
Monocytes Relative: 8 %
NEUTROS ABS: 3.5 10*3/uL (ref 1.7–7.7)
NEUTROS PCT: 51 %
Platelets: 288 10*3/uL (ref 150–400)
RBC: 4.21 MIL/uL (ref 3.87–5.11)
RDW: 14.5 % (ref 11.5–15.5)
WBC: 6.7 10*3/uL (ref 4.0–10.5)

## 2018-01-22 LAB — D-DIMER, QUANTITATIVE (NOT AT ARMC): D DIMER QUANT: 0.45 ug{FEU}/mL (ref 0.00–0.50)

## 2018-01-22 MED ORDER — FUROSEMIDE 20 MG PO TABS: 20.0000 mg | ORAL_TABLET | Freq: Every day | ORAL | 0 refills | Status: DC

## 2018-01-22 NOTE — Discharge Instructions (Addendum)
Lasix as prescribed.  Return tomorrow with the given time for an ultrasound to rule out a blood clot.  Follow-up with your primary doctor in the next 7 to 10 days for a recheck.  Return to the emergency department in the meantime if symptoms significantly worsen or change.

## 2018-01-23 ENCOUNTER — Ambulatory Visit (HOSPITAL_COMMUNITY): Payer: Managed Care, Other (non HMO)

## 2018-01-24 ENCOUNTER — Ambulatory Visit (HOSPITAL_COMMUNITY): Payer: No Typology Code available for payment source

## 2018-01-27 ENCOUNTER — Ambulatory Visit (HOSPITAL_COMMUNITY)
Admission: RE | Admit: 2018-01-27 | Discharge: 2018-01-27 | Disposition: A | Discharge status: Home / Self Care | Payer: No Typology Code available for payment source | Source: Ambulatory Visit | Attending: Emergency Medicine | Admitting: Emergency Medicine

## 2018-01-27 DIAGNOSIS — R600 Localized edema: Secondary | ICD-10-CM | POA: Diagnosis present

## 2018-01-27 NOTE — ED Notes (Signed)
Results read by Dr. Rogene Houston. Pt had to leave to go to work. Charge RN to notify pt of results per EDP.

## 2018-01-27 NOTE — ED Notes (Signed)
Spoke with pt about results and EDP recommendations for follow up.

## 2018-02-24 ENCOUNTER — Emergency Department (HOSPITAL_COMMUNITY)
Admission: EM | Admit: 2018-02-24 | Discharge: 2018-02-25 | Disposition: A | Discharge status: Home / Self Care | Payer: No Typology Code available for payment source | Attending: Emergency Medicine | Admitting: Emergency Medicine

## 2018-02-24 ENCOUNTER — Encounter (HOSPITAL_COMMUNITY): Payer: Self-pay | Admitting: Emergency Medicine

## 2018-02-24 ENCOUNTER — Emergency Department (HOSPITAL_COMMUNITY): Payer: No Typology Code available for payment source

## 2018-02-24 DIAGNOSIS — Z5321 Procedure and treatment not carried out due to patient leaving prior to being seen by health care provider: Secondary | ICD-10-CM | POA: Insufficient documentation

## 2018-02-24 DIAGNOSIS — R2231 Localized swelling, mass and lump, right upper limb: Secondary | ICD-10-CM | POA: Diagnosis present

## 2018-02-24 NOTE — ED Triage Notes (Signed)
Pt reports swelling to R distal third finger, reports hx of infection there in the past. Denies trauma. Decreased movement, OTC pain meds not effective.

## 2018-02-28 ENCOUNTER — Encounter (HOSPITAL_COMMUNITY): Payer: Self-pay

## 2018-02-28 ENCOUNTER — Other Ambulatory Visit: Payer: Self-pay

## 2018-02-28 ENCOUNTER — Emergency Department (HOSPITAL_COMMUNITY): Payer: No Typology Code available for payment source

## 2018-02-28 ENCOUNTER — Emergency Department (HOSPITAL_COMMUNITY)
Admission: EM | Admit: 2018-02-28 | Discharge: 2018-02-28 | Disposition: A | Discharge status: Home / Self Care | Payer: No Typology Code available for payment source | Attending: Emergency Medicine | Admitting: Emergency Medicine

## 2018-02-28 DIAGNOSIS — J111 Influenza due to unidentified influenza virus with other respiratory manifestations: Secondary | ICD-10-CM

## 2018-02-28 DIAGNOSIS — Z79899 Other long term (current) drug therapy: Secondary | ICD-10-CM | POA: Insufficient documentation

## 2018-02-28 DIAGNOSIS — R0981 Nasal congestion: Secondary | ICD-10-CM | POA: Diagnosis present

## 2018-02-28 DIAGNOSIS — R69 Illness, unspecified: Secondary | ICD-10-CM

## 2018-02-28 LAB — GROUP A STREP BY PCR: Group A Strep by PCR: NOT DETECTED

## 2018-02-28 MED ORDER — FLUTICASONE PROPIONATE 50 MCG/ACT NA SUSP: 1.0000 | Freq: Every day | NASAL | 0 refills | Status: DC

## 2018-02-28 MED ORDER — BENZONATATE 100 MG PO CAPS: 100.0000 mg | ORAL_CAPSULE | Freq: Three times a day (TID) | ORAL | 0 refills | Status: DC

## 2018-02-28 MED ORDER — IBUPROFEN 800 MG PO TABS: 800.0000 mg | ORAL_TABLET | Freq: Three times a day (TID) | ORAL | 0 refills | Status: DC

## 2018-02-28 NOTE — ED Provider Notes (Signed)
[facility] EMERGENCY DEPARTMENT Provider Note   CSN: [id number] Arrival date & time: 02/28/18  1144     History   Chief Complaint Chief Complaint  Patient presents with  . Nasal Congestion    HPI Jessica Sanchez is a 32 y.o. female who presents to the ED with complaints of influenza like sxs for the past 3 days. Patient reports she has been experiencing congestion, rhinorrhea, bilateral ear pressure, sore throat, dry cough, wheezing, body aches, as well as subjective fever and chills.  No specific alleviating or aggravating factors.  She has tried TheraFlu as well as DayQuil and NyQuil without significant relief.  Denies nausea, vomiting, dyspnea, or chest pain.   HPI  Past Medical History:  Diagnosis Date  . Fibroid (bleeding) (uterine)   . Fibroid, uterine   . H/O metrorrhagia   . IUD (intrauterine device) in place 07/11/2015  . Menorrhagia     Patient Active Problem List   Diagnosis Date Noted  . IUD (intrauterine device) in place 07/11/2015  . Other disorder of menstruation and other abnormal bleeding from female genital tract 10/30/2012    Past Surgical History:  Procedure Laterality Date  . HYSTEROSCOPY W/D&C N/A 09/27/2013   Procedure: DILATATION AND CURETTAGE /HYSTEROSCOPY and insertion of Mirena IUD ;  Surgeon: [name]. Harrington Challenger, MD;  Location: [place] [facility];  Service: Gynecology;  Laterality: N/A;  . MYOMECTOMY  02/03/2011   Procedure: MYOMECTOMY;  Surgeon: [name], MD;  Location: [facility];  Service: Gynecology;  Laterality: N/A;  . TONSILLECTOMY       OB History    Gravida  1   Para      Term      Preterm      AB  1   Living        SAB  1   TAB      Ectopic      Multiple      Live Births               Home Medications    Prior to Admission medications   Medication Sig Start Date End Date Taking? Authorizing Provider  albuterol (PROVENTIL HFA) 108 (90 Base) MCG/ACT inhaler Inhale 1-2 puffs into the lungs every 6 (six) hours as needed for  wheezing or shortness of breath.     [provider]  diclofenac (VOLTAREN) 75 MG EC tablet Take 1 tablet (75 mg total) by mouth 2 (two) times daily. Patient not taking: Reported on 10/11/2017 09/30/17   Lily Kocher, PA-C  furosemide (LASIX) 20 MG tablet Take 1 tablet (20 mg total) by mouth daily. 01/22/18   Veryl Speak, MD  levonorgestrel (MIRENA) 20 MCG/24HR IUD 1 each by Intrauterine route once. EVERY 5 YEARS    [provider]  methocarbamol (ROBAXIN) 500 MG tablet Take 1 tablet (500 mg total) by mouth 3 (three) times daily. Patient taking differently: Take 500 mg by mouth daily as needed.  10/24/17   Carole Civil, MD  metoprolol tartrate (LOPRESSOR) 25 MG tablet Take 1 tablet (25 mg total) by mouth 2 (two) times daily as needed (tachycardia, you can also take one extra dose if needed for persistent tachcyardia). 11/19/17 12/19/17  Dorie Rank, MD  Multiple Vitamins-Calcium (ONE-A-DAY WOMENS PO) Take 1 tablet by mouth daily.    [provider]  neomycin-polymyxin-hydrocortisone (CORTISPORIN) 3.5-10000-1 OTIC suspension Place 4 drops into the left ear 3 (three) times daily. Patient not taking: Reported on 10/24/2017 10/23/17   Long,  Wonda Old[place], MD  Nystatin POWD Apply to affected area 4 times daily as needed Patient not taking: Reported on 10/23/2017 10/11/17   Roma Schanz, CNM    Family History Family History  Problem Relation Age of Onset  . Cirrhosis Mother   . Diabetes Mother   . Cancer Maternal Grandfather   . Hypertension Sister   . Diabetes Sister   . Anesthesia problems Neg Hx   . Hypotension Neg Hx   . Malignant hyperthermia Neg Hx   . Pseudochol deficiency Neg Hx     Social History Social History   Tobacco Use  . Smoking status: Never Smoker  . Smokeless tobacco: Never Used  Substance Use Topics  . Alcohol use: Yes    Comment: occasional  . Drug use: No     Allergies   Shellfish allergy and Adhesive [tape]   Review of  Systems Review of Systems  Constitutional: Positive for chills and fever.  HENT: Positive for congestion, ear pain and sore throat.   Respiratory: Positive for cough and wheezing. Negative for shortness of breath.   Gastrointestinal: Negative for abdominal pain, diarrhea and vomiting.  Musculoskeletal: Positive for myalgias.    Physical Exam Updated Vital Signs BP 124/76 (BP Location: Right Arm)   Pulse 79   Temp 98.3 F (36.8 C) (Oral)   Resp 18   Ht 5\' 2"  (1.575 m)   Wt 123.4 kg   LMP 10/29/2017   SpO2 99%   BMI 49.75 kg/m   Physical Exam  Constitutional: She appears well-developed and well-nourished.  Non-toxic appearance. No distress.  HENT:  Head: Normocephalic and atraumatic.  Right Ear: No drainage, swelling or tenderness. Tympanic membrane is not perforated, not erythematous, not retracted and not bulging.  Left Ear: No drainage, swelling or tenderness. Tympanic membrane is not perforated, not erythematous, not retracted and not bulging.  Nose: Mucosal edema present.  Mouth/Throat: Uvula is midline. Posterior oropharyngeal erythema present. No oropharyngeal exudate.  Posterior oropharynx is symmetric appearing. Patient tolerating own secretions without difficulty. No trismus. No drooling. No hot potato voice. No swelling beneath the tongue, submandibular compartment is soft.    Eyes: Pupils are equal, round, and reactive to light. Conjunctivae are normal. Right eye exhibits no discharge. Left eye exhibits no discharge.  Neck: Normal range of motion. Neck supple.  Cardiovascular: Normal rate and regular rhythm.  No murmur heard. Pulmonary/Chest: Effort normal and breath sounds normal. No stridor. No respiratory distress. She has no wheezes. She has no rales.  Abdominal: Soft. She exhibits no distension. There is no tenderness.  Lymphadenopathy:    She has cervical adenopathy (mild anterior).  Neurological: She is alert.  Skin: Skin is warm and dry. No rash noted.   Psychiatric: She has a normal mood and affect. Her behavior is normal.  Nursing note and vitals reviewed.    ED Treatments / Results  Labs (all labs ordered are listed, but only abnormal results are displayed) Labs Reviewed  GROUP A STREP BY PCR    EKG None  Radiology Dg Chest 2 View  Result Date: 02/28/2018 CLINICAL DATA:  Chest pain, body aches, wheezing EXAM: CHEST - 2 VIEW COMPARISON:  Chest x-ray of 11/27/2017 FINDINGS: No pneumonia or effusion is seen. There is mild peribronchial thickening and bronchitis would be a consideration. The heart is within normal limits in size. No bony abnormality is seen. IMPRESSION: No pneumonia.  Cannot exclude bronchitis. Electronically Signed   By: Ivar Drape M.D.   On: 02/28/2018  12:14    Procedures Procedures (including critical care time)  Medications Ordered in ED Medications - No data to display   Initial Impression / Assessment and Plan / ED Course  I have reviewed the triage vital signs and the nursing notes.  Pertinent labs & imaging results that were available during my care of the patient were reviewed by me and considered in my medical decision making (see chart for details).  Patient presents with flu like symptoms.  Patient is nontoxic appearing, in no apparent distress, vitals are WNL. Patient is afebrile in the ED, lungs are CTA, CXR negative for infiltrate, doubt pneumonia. There is no wheezing or signs of respiratory distress on my exam. Sxs onset < 7 days, afebrile here, no sinus tenderness, doubt acute bacterial sinusitis. Strep test negative, findings do not appear consistent with RPA/PTA. No evidence of AOM on exam. No meningeal signs. No history components or rashes to raise concern for tic borne illness. Suspect viral at this time, possibly influenza however outside of tamiflu tx window, will treat supportively with Ibuprofen, Flonase, and Tessalon, she also has an inhaler which she can utilize at home for wheezing-  sxs do not seem consistent with RAD exacerbation. I discussed results, treatment plan, need for PCP follow-up, and return precautions with the patient. Provided opportunity for questions, patient confirmed understanding and is in agreement with plan.    Final Clinical Impressions(s) / ED Diagnoses   Final diagnoses:  Viral illness    ED Discharge Orders         Ordered    fluticasone (FLONASE) 50 MCG/ACT nasal spray  Daily     02/28/18 1621    benzonatate (TESSALON) 100 MG capsule  Every 8 hours     02/28/18 1621    ibuprofen (ADVIL,MOTRIN) 800 MG tablet  3 times daily     02/28/18 1621           Erique Kaser, [place] R, PA-C 02/28/18 1621    Milton Ferguson, MD 03/01/18 [phone]

## 2018-02-28 NOTE — Discharge Instructions (Addendum)
You were seen in the emergency today for upper respiratory symptoms, we suspect your symptoms are related to a virus at this time.  I have prescribed you multiple medications to treat your symptoms.   -Flonase to be used 1 spray in each nostril daily.  This medication is used to treat your congestion.  -Tessalon can be taken once every 8 hours as needed.  This medication is used to treat your cough.  -Ibuprofen to be taken once every 8 hours as needed for pain. Please take this medicine with food as it can cause stomach upset and at worst stomach bleeding. Do not take other NSAIDs such as motrin, aleve, advil, naproxen, mobic, etc as they are similar. You make take tylenol per over the counter dosing with this medicine safely.  We have prescribed you new medication(s) today. Discuss the medications prescribed today with your pharmacist as they can have adverse effects and interactions with your other medicines including over the counter and prescribed medications. Seek medical evaluation if you start to experience new or abnormal symptoms after taking one of these medicines, seek care immediately if you start to experience difficulty breathing, feeling of your throat closing, facial swelling, or rash as these could be indications of a more serious allergic reaction  You will need to follow-up with your primary care provider in 1 week if your symptoms have not improved.  If you do not have a primary care provider one is provided in your discharge instructions.  Return to the emergency department for any new or worsening symptoms including but not limited to persistent fever for 5 days, difficulty breathing, chest pain, rashes, passing out, or any other concerns.

## 2018-02-28 NOTE — ED Triage Notes (Addendum)
Pt is having some body aches, ears are hurting as well. Is having wheezing as well. Has tried Theraflu, Daytime and Night time sinus and cold as well. States, "I believe I may have the flu."

## 2018-05-06 ENCOUNTER — Other Ambulatory Visit: Payer: Self-pay

## 2018-05-06 ENCOUNTER — Encounter (HOSPITAL_COMMUNITY): Payer: Self-pay | Admitting: *Deleted

## 2018-05-06 ENCOUNTER — Emergency Department (HOSPITAL_COMMUNITY): Payer: No Typology Code available for payment source

## 2018-05-06 ENCOUNTER — Emergency Department (HOSPITAL_COMMUNITY)
Admission: EM | Admit: 2018-05-06 | Discharge: 2018-05-06 | Disposition: A | Discharge status: Home / Self Care | Payer: No Typology Code available for payment source | Attending: Emergency Medicine | Admitting: Emergency Medicine

## 2018-05-06 DIAGNOSIS — M62830 Muscle spasm of back: Secondary | ICD-10-CM | POA: Insufficient documentation

## 2018-05-06 DIAGNOSIS — M542 Cervicalgia: Secondary | ICD-10-CM | POA: Diagnosis present

## 2018-05-06 DIAGNOSIS — Z79899 Other long term (current) drug therapy: Secondary | ICD-10-CM | POA: Insufficient documentation

## 2018-05-06 DIAGNOSIS — M62838 Other muscle spasm: Secondary | ICD-10-CM

## 2018-05-06 DIAGNOSIS — R52 Pain, unspecified: Secondary | ICD-10-CM

## 2018-05-06 MED ORDER — IBUPROFEN 600 MG PO TABS: 600.0000 mg | ORAL_TABLET | Freq: Four times a day (QID) | ORAL | 0 refills | Status: DC | PRN

## 2018-05-06 MED ORDER — CYCLOBENZAPRINE HCL 5 MG PO TABS: 5.0000 mg | ORAL_TABLET | Freq: Three times a day (TID) | ORAL | 0 refills | Status: DC | PRN

## 2018-05-06 MED ORDER — IBUPROFEN 800 MG PO TABS
800.0000 mg | ORAL_TABLET | Freq: Once | ORAL | Status: AC
  Administered 2018-05-06: 800 mg via ORAL
  Filled 2018-05-06: qty 1

## 2018-05-06 MED ORDER — CYCLOBENZAPRINE HCL 10 MG PO TABS
10.0000 mg | ORAL_TABLET | Freq: Once | ORAL | Status: AC
  Administered 2018-05-06: 10 mg via ORAL
  Filled 2018-05-06: qty 1

## 2018-05-06 NOTE — ED Triage Notes (Signed)
Pt c/o right shoulder and right neck stiffness and pain x 3 days. Pt reports hx of right rotator cuff problems but was unable to get MRI done in the past. Pt reports taking ASA and Back and Bayer for the pain with no relief. Pt has also used hot compresses with no relief. Denies injury.

## 2018-05-06 NOTE — Discharge Instructions (Addendum)
Your x-rays suggest that you do have a muscle spasm which is the source of today's symptoms.  Use the medications as prescribed, using caution with Flexeril as this medication may make you drowsy.  Apply heating pad to your neck and shoulder area for 20 minutes 3 times daily followed by gentle stretching of this muscle as discussed.  This will probably take several days before you notice improved range of motion and decreased pain.

## 2018-05-08 NOTE — ED Provider Notes (Signed)
[facility] EMERGENCY DEPARTMENT Provider Note   CSN: [id number] Arrival date & time: 05/06/18  1111     History   Chief Complaint Chief Complaint  Patient presents with  . Neck Pain    HPI Jessica Sanchez is a 33 y.o. female with no pertinent past medical history although has had a history of a right chronic shoulder pain followed by Dr. Aline Brochure, suspected to be a rotator cuff injury but has not yet undergone MRI imaging for this describes pain and stiffness along her right lateral neck radiating into her shoulder x3 days.  Her pain is worsened with positional changes and rotating her head right greater than left exacerbates pain.  She also noticed decreased range of motion secondary to stiffness.  She denies fevers or chills and has had no injuries to her neck.  She also denies weakness or numbness in her arms or hands.  She denies headache.  She has taken aspirin and Bayer back body pain reliever without improvement in her symptoms.  The history is provided by the patient.    Past Medical History:  Diagnosis Date  . Fibroid (bleeding) (uterine)   . Fibroid, uterine   . H/O metrorrhagia   . IUD (intrauterine device) in place 07/11/2015  . Menorrhagia     Patient Active Problem List   Diagnosis Date Noted  . IUD (intrauterine device) in place 07/11/2015  . Other disorder of menstruation and other abnormal bleeding from female genital tract 10/30/2012    Past Surgical History:  Procedure Laterality Date  . HYSTEROSCOPY W/D&C N/A 09/27/2013   Procedure: DILATATION AND CURETTAGE /HYSTEROSCOPY and insertion of Mirena IUD ;  Surgeon: [name]. Harrington Challenger, MD;  Location: [place] [facility];  Service: Gynecology;  Laterality: N/A;  . MYOMECTOMY  02/03/2011   Procedure: MYOMECTOMY;  Surgeon: [name], MD;  Location: [facility];  Service: Gynecology;  Laterality: N/A;  . TONSILLECTOMY       OB History    Gravida  1   Para      Term      Preterm      AB  1   Living        SAB  1   TAB        Ectopic      Multiple      Live Births               Home Medications    Prior to Admission medications   Medication Sig Start Date End Date Taking? Authorizing Provider  albuterol (PROVENTIL HFA) 108 (90 Base) MCG/ACT inhaler Inhale 1-2 puffs into the lungs every 6 (six) hours as needed for wheezing or shortness of breath.     [provider]  benzonatate (TESSALON) 100 MG capsule Take 1 capsule (100 mg total) by mouth every 8 (eight) hours. 02/28/18   Petrucelli, Samantha R, PA-C  cyclobenzaprine (FLEXERIL) 5 MG tablet Take 1 tablet (5 mg total) by mouth 3 (three) times daily as needed for muscle spasms. 05/06/18   [name], PA-C  fluticasone (FLONASE) 50 MCG/ACT nasal spray Place 1 spray into both nostrils daily. 02/28/18   Petrucelli, Samantha R, PA-C  furosemide (LASIX) 20 MG tablet Take 1 tablet (20 mg total) by mouth daily. 01/22/18   Veryl Speak, MD  ibuprofen (ADVIL,MOTRIN) 600 MG tablet Take 1 tablet (600 mg total) by mouth every 6 (six) hours as needed. 05/06/18   [name], PA-C  levonorgestrel (MIRENA) 20 MCG/24HR IUD 1 each  by Intrauterine route once. EVERY 5 YEARS    [provider]  methocarbamol (ROBAXIN) 500 MG tablet Take 1 tablet (500 mg total) by mouth 3 (three) times daily. Patient taking differently: Take 500 mg by mouth daily as needed.  10/24/17   Carole Civil, MD  metoprolol tartrate (LOPRESSOR) 25 MG tablet Take 1 tablet (25 mg total) by mouth 2 (two) times daily as needed (tachycardia, you can also take one extra dose if needed for persistent tachcyardia). 11/19/17 12/19/17  Dorie Rank, MD  Multiple Vitamins-Calcium (ONE-A-DAY WOMENS PO) Take 1 tablet by mouth daily.    [provider]    Family History Family History  Problem Relation Age of Onset  . Cirrhosis Mother   . Diabetes Mother   . Cancer Maternal Grandfather   . Hypertension Sister   . Diabetes Sister   . Anesthesia problems Neg Hx   . Hypotension Neg  Hx   . Malignant hyperthermia Neg Hx   . Pseudochol deficiency Neg Hx     Social History Social History   Tobacco Use  . Smoking status: Never Smoker  . Smokeless tobacco: Never Used  Substance Use Topics  . Alcohol use: Yes    Comment: occasional  . Drug use: No     Allergies   Shellfish allergy and Adhesive [tape]   Review of Systems Review of Systems  Constitutional: Negative for chills and fever.  Musculoskeletal: Positive for arthralgias and neck stiffness. Negative for back pain, joint swelling and myalgias.  Skin: Negative.   Neurological: Negative for weakness, numbness and headaches.     Physical Exam Updated Vital Signs BP 120/82   Pulse 70   Temp 98.2 F (36.8 C) (Oral)   Resp 16   Ht 5\' 2"  (1.575 m)   Wt 125.2 kg   SpO2 99%   BMI 50.48 kg/m   Physical Exam Vitals signs and nursing note reviewed.  Constitutional:      Appearance: She is well-developed.  HENT:     Head: Normocephalic.  Eyes:     Conjunctiva/sclera: Conjunctivae normal.  Neck:     Musculoskeletal: Normal range of motion and neck supple.  Cardiovascular:     Rate and Rhythm: Normal rate.     Comments: Pedal pulses normal. Pulmonary:     Effort: Pulmonary effort is normal.  Abdominal:     General: Bowel sounds are normal. There is no distension.     Palpations: Abdomen is soft. There is no mass.  Musculoskeletal: Normal range of motion.     Cervical back: She exhibits pain and spasm. She exhibits no bony tenderness.     Thoracic back: Normal.     Lumbar back: She exhibits no tenderness, no swelling, no edema and no spasm.       Back:     Comments: Tender to palpation of the right paracervical musculature.  Patient displays decreased range of motion with rightward flexion, she has fair leftward mobility.  She can flex and extend her neck without significant increased pain.  Skin:    General: Skin is warm and dry.  Neurological:     Mental Status: She is alert.      Sensory: No sensory deficit.     Motor: No tremor or atrophy.     Gait: Gait normal.     Deep Tendon Reflexes:     Reflex Scores:      Bicep reflexes are 2+ on the right side and 2+ on the left side.  Comments: Equal grip strength.No strength deficit noted in elbow and wrist flexor and extensor muscle groups.        ED Treatments / Results  Labs (all labs ordered are listed, but only abnormal results are displayed) Labs Reviewed - No data to display  EKG None  Radiology No results found.   Dg Cervical Spine Complete  Result Date: 05/06/2018 CLINICAL DATA:  33 y/o F; right neck and shoulder pain for 2-3 days. No known injury. EXAM: CERVICAL SPINE - COMPLETE 4+ VIEW COMPARISON:  10/24/2017 cervical spine radiographs. FINDINGS: C1-C7 are visible on the lateral view. Straightening of cervical lordosis without listhesis. No prevertebral soft tissue thickening. Intervertebral and disc space heights are maintained. Normal anterior C1-2 articulation. Intact odontoid process. IMPRESSION: No acute osseous abnormality. Straightening of cervical lordosis, possibly positional or related to muscle spasm. Electronically Signed   By: Kristine Garbe M.D.   On: 05/06/2018 13:08     Procedures Procedures (including critical care time)  Medications Ordered in ED Medications  cyclobenzaprine (FLEXERIL) tablet 10 mg (10 mg Oral Given 05/06/18 1225)  ibuprofen (ADVIL,MOTRIN) tablet 800 mg (800 mg Oral Given 05/06/18 1225)     Initial Impression / Assessment and Plan / ED Course  I have reviewed the triage vital signs and the nursing notes.  Pertinent labs & imaging results that were available during my care of the patient were reviewed by me and considered in my medical decision making (see chart for details).     Patient with right trapezius spasm/torticollis.  She was placed on Flexeril and ibuprofen.  Discussed heat therapy followed by gentle range of motion and stretching.  Plan  follow-up with PCP or Ortho if symptoms persist or worsen.  Patient has no neuro deficits on today's exam.  Final Clinical Impressions(s) / ED Diagnoses   Final diagnoses:  Pain  Trapezius muscle spasm    ED Discharge Orders         Ordered    ibuprofen (ADVIL,MOTRIN) 600 MG tablet  Every 6 hours PRN     05/06/18 1339    cyclobenzaprine (FLEXERIL) 5 MG tablet  3 times daily PRN     05/06/18 1339           [name], Hershal Coria 05/08/18 1715    Julianne Rice, MD 05/08/18 1745

## 2018-05-26 ENCOUNTER — Emergency Department (HOSPITAL_COMMUNITY)
Admission: EM | Admit: 2018-05-26 | Discharge: 2018-05-26 | Disposition: A | Discharge status: Home / Self Care | Payer: No Typology Code available for payment source | Attending: Emergency Medicine | Admitting: Emergency Medicine

## 2018-05-26 ENCOUNTER — Other Ambulatory Visit: Payer: Self-pay

## 2018-05-26 ENCOUNTER — Encounter (HOSPITAL_COMMUNITY): Payer: Self-pay | Admitting: Emergency Medicine

## 2018-05-26 DIAGNOSIS — J111 Influenza due to unidentified influenza virus with other respiratory manifestations: Secondary | ICD-10-CM | POA: Diagnosis not present

## 2018-05-26 DIAGNOSIS — Z79899 Other long term (current) drug therapy: Secondary | ICD-10-CM | POA: Insufficient documentation

## 2018-05-26 DIAGNOSIS — R69 Illness, unspecified: Secondary | ICD-10-CM

## 2018-05-26 DIAGNOSIS — R52 Pain, unspecified: Secondary | ICD-10-CM | POA: Diagnosis present

## 2018-05-26 DIAGNOSIS — N3001 Acute cystitis with hematuria: Secondary | ICD-10-CM | POA: Diagnosis not present

## 2018-05-26 LAB — URINALYSIS, ROUTINE W REFLEX MICROSCOPIC
Bilirubin Urine: NEGATIVE
GLUCOSE, UA: NEGATIVE mg/dL
KETONES UR: NEGATIVE mg/dL
Nitrite: NEGATIVE
PH: 6 (ref 5.0–8.0)
PROTEIN: NEGATIVE mg/dL
Specific Gravity, Urine: 1.025 (ref 1.005–1.030)

## 2018-05-26 LAB — PREGNANCY, URINE: Preg Test, Ur: NEGATIVE

## 2018-05-26 MED ORDER — CEPHALEXIN 500 MG PO CAPS
500.0000 mg | ORAL_CAPSULE | Freq: Once | ORAL | Status: AC
  Administered 2018-05-26: 500 mg via ORAL
  Filled 2018-05-26: qty 1

## 2018-05-26 MED ORDER — FLUTICASONE PROPIONATE 50 MCG/ACT NA SUSP: 1.0000 | Freq: Every day | NASAL | 2 refills | Status: DC

## 2018-05-26 MED ORDER — BENZONATATE 100 MG PO CAPS: 100.0000 mg | ORAL_CAPSULE | Freq: Three times a day (TID) | ORAL | 0 refills | Status: DC

## 2018-05-26 MED ORDER — CEPHALEXIN 500 MG PO CAPS: 500.0000 mg | ORAL_CAPSULE | Freq: Four times a day (QID) | ORAL | 0 refills | Status: AC

## 2018-05-26 NOTE — ED Notes (Signed)
To BR to attempt a UA

## 2018-05-26 NOTE — ED Triage Notes (Signed)
Onset 3 days ago, cough, green congestion, headache, fevers. Also has blood in urine and hurts to void.

## 2018-05-26 NOTE — ED Notes (Signed)
LL in to assess

## 2018-05-26 NOTE — ED Provider Notes (Signed)
[facility] EMERGENCY DEPARTMENT Provider Note   CSN: [id number] Arrival date & time: 05/26/18  2017     History   Chief Complaint Chief Complaint  Patient presents with  . Generalized Body Aches    HPI Jessica Sanchez is a 33 y.o. female who presents for evaluation of 2 complaints.  Patient reports that for the last 3 days, she has had generalized body aches, fever, chills, fatigue, decreased appetite, nasal congestion, cough, chest soreness.  Patient reports she has measured fever of 100 yesterday.  She has been taking Tylenol and ibuprofen.  She not have any today.  Patient states she has had some decreased appetite.  She had one episode of vomiting today when she tried to eat.  She denies any abdominal pain.  Patient reports that she is also had some nasal congestion.  She states that she associated soreness that only occurs when coughing.  Patient reports she just "aches all over." She reports that she has tried over-the-counter Alka-Seltzer with no improvement in symptoms.  Patient reports she did not get a flu shot this year.  Additionally, patient reports she has been having some hematuria and dysuria for the last week.  Patient states that she commonly has hematuria and has been seen for this before.  States that the dysuria is new since last week.  She also reports some achiness in her lower back.  She has not followed up with her PCP since onset of symptoms.  Patient states that she has not noticed any increased urinary frequency.  States she is not a current smoker denies any history of asthma.  The history is provided by the patient.    Past Medical History:  Diagnosis Date  . Fibroid (bleeding) (uterine)   . Fibroid, uterine   . H/O metrorrhagia   . IUD (intrauterine device) in place 07/11/2015  . Menorrhagia     Patient Active Problem List   Diagnosis Date Noted  . IUD (intrauterine device) in place 07/11/2015  . Other disorder of menstruation and other abnormal bleeding  from female genital tract 10/30/2012    Past Surgical History:  Procedure Laterality Date  . HYSTEROSCOPY W/D&C N/A 09/27/2013   Procedure: DILATATION AND CURETTAGE /HYSTEROSCOPY and insertion of Mirena IUD ;  Surgeon: [name]. Harrington Challenger, MD;  Location: [place] [facility];  Service: Gynecology;  Laterality: N/A;  . MYOMECTOMY  02/03/2011   Procedure: MYOMECTOMY;  Surgeon: [name], MD;  Location: [facility];  Service: Gynecology;  Laterality: N/A;  . TONSILLECTOMY       OB History    Gravida  1   Para      Term      Preterm      AB  1   Living        SAB  1   TAB      Ectopic      Multiple      Live Births               Home Medications    Prior to Admission medications   Medication Sig Start Date End Date Taking? Authorizing Provider  albuterol (PROVENTIL HFA) 108 (90 Base) MCG/ACT inhaler Inhale 1-2 puffs into the lungs every 6 (six) hours as needed for wheezing or shortness of breath.     [provider]  benzonatate (TESSALON) 100 MG capsule Take 1 capsule (100 mg total) by mouth every 8 (eight) hours. 05/26/18   Volanda Napoleon, PA-C  cephALEXin (KEFLEX)  500 MG capsule Take 1 capsule (500 mg total) by mouth 4 (four) times daily for 7 days. 05/26/18 06/02/18  Volanda Napoleon, PA-C  cyclobenzaprine (FLEXERIL) 5 MG tablet Take 1 tablet (5 mg total) by mouth 3 (three) times daily as needed for muscle spasms. 05/06/18   [name], PA-C  fluticasone (FLONASE) 50 MCG/ACT nasal spray Place 1 spray into both nostrils daily. 05/26/18   Volanda Napoleon, PA-C  furosemide (LASIX) 20 MG tablet Take 1 tablet (20 mg total) by mouth daily. 01/22/18   Veryl Speak, MD  ibuprofen (ADVIL,MOTRIN) 600 MG tablet Take 1 tablet (600 mg total) by mouth every 6 (six) hours as needed. 05/06/18   [name], PA-C  levonorgestrel (MIRENA) 20 MCG/24HR IUD 1 each by Intrauterine route once. EVERY 5 YEARS    [provider]  methocarbamol (ROBAXIN) 500 MG tablet Take 1 tablet (500 mg  total) by mouth 3 (three) times daily. Patient taking differently: Take 500 mg by mouth daily as needed.  10/24/17   Carole Civil, MD  metoprolol tartrate (LOPRESSOR) 25 MG tablet Take 1 tablet (25 mg total) by mouth 2 (two) times daily as needed (tachycardia, you can also take one extra dose if needed for persistent tachcyardia). 11/19/17 12/19/17  Dorie Rank, MD  Multiple Vitamins-Calcium (ONE-A-DAY WOMENS PO) Take 1 tablet by mouth daily.    [provider]    Family History Family History  Problem Relation Age of Onset  . Cirrhosis Mother   . Diabetes Mother   . Cancer Maternal Grandfather   . Hypertension Sister   . Diabetes Sister   . Anesthesia problems Neg Hx   . Hypotension Neg Hx   . Malignant hyperthermia Neg Hx   . Pseudochol deficiency Neg Hx     Social History Social History   Tobacco Use  . Smoking status: Never Smoker  . Smokeless tobacco: Never Used  Substance Use Topics  . Alcohol use: Yes    Comment: occasional  . Drug use: No     Allergies   Shellfish allergy and Adhesive [tape]   Review of Systems Review of Systems  Constitutional: Positive for appetite change, chills, fatigue and fever.  HENT: Positive for congestion and rhinorrhea.   Respiratory: Positive for cough. Negative for shortness of breath.   Cardiovascular: Negative for chest pain.  Gastrointestinal: Positive for vomiting. Negative for abdominal pain and nausea.  Genitourinary: Positive for dysuria and hematuria.  Musculoskeletal: Positive for myalgias.  Neurological: Negative for headaches.  All other systems reviewed and are negative.    Physical Exam Updated Vital Signs BP 129/85 (BP Location: Right Arm)   Pulse 85   Temp 98.4 F (36.9 C) (Oral)   Resp 20   Ht 5\' 2"  (1.575 m)   Wt 123.4 kg   SpO2 99%   BMI 49.75 kg/m   Physical Exam Vitals signs and nursing note reviewed.  Constitutional:      Appearance: Normal appearance. She is well-developed.    HENT:     Head: Normocephalic and atraumatic.     Nose: Congestion present.     Comments: Edematous and erythematous nasal turbinates bilaterally.    Mouth/Throat:     Mouth: Mucous membranes are moist.     Pharynx: Oropharynx is clear.     Comments: Airway is patent, phonation is intact. Uvula is midline.  No trismus. Eyes:     General: Lids are normal.     Conjunctiva/sclera: Conjunctivae normal.  Pupils: Pupils are equal, round, and reactive to light.  Neck:     Musculoskeletal: Full passive range of motion without pain and neck supple. No neck rigidity.  Cardiovascular:     Rate and Rhythm: Normal rate and regular rhythm.     Pulses: Normal pulses.     Heart sounds: Normal heart sounds. No murmur. No friction rub. No gallop.   Pulmonary:     Effort: Pulmonary effort is normal.     Breath sounds: Normal breath sounds.     Comments: Lungs clear to auscultation bilaterally.  Symmetric chest rise.  No wheezing, rales, rhonchi. Abdominal:     Palpations: Abdomen is soft. Abdomen is not rigid.     Tenderness: There is no abdominal tenderness. There is no right CVA tenderness, left CVA tenderness or guarding.     Comments: Abdomen is soft, non-distended, non-tender. No rigidity, No guarding. No peritoneal signs.  No CVA tenderness bilaterally.  Musculoskeletal: Normal range of motion.  Skin:    General: Skin is warm and dry.     Capillary Refill: Capillary refill takes less than 2 seconds.  Neurological:     Mental Status: She is alert and oriented to person, place, and time.  Psychiatric:        Speech: Speech normal.      ED Treatments / Results  Labs (all labs ordered are listed, but only abnormal results are displayed) Labs Reviewed  URINALYSIS, ROUTINE W REFLEX MICROSCOPIC - Abnormal; Notable for the following components:      Result Value   Hgb urine dipstick MODERATE (*)    Leukocytes, UA TRACE (*)    Bacteria, UA RARE (*)    All other components within normal  limits  URINE CULTURE  PREGNANCY, URINE    EKG None  Radiology No results found.  Procedures Procedures (including critical care time)  Medications Ordered in ED Medications  cephALEXin (KEFLEX) capsule 500 mg (has no administration in time range)     Initial Impression / Assessment and Plan / ED Course  I have reviewed the triage vital signs and the nursing notes.  Pertinent labs & imaging results that were available during my care of the patient were reviewed by me and considered in my medical decision making (see chart for details).     33 year old female who presents for evaluation of 2 complaints.  First patient reports 3 days of cough, congestion, generalized body aches, fever, chills, chest soreness with coughing.  Second, patient reports she has had 1 week of hematuria and dysuria.  Does report some achy low back pain. Patient is afebrile, non-toxic appearing, sitting comfortably on examination table. Vital signs reviewed and stable.  Consider influenza versus viral URI.  On exam, lungs clear to auscultation bilaterally.  History/physical exam not concerning for pneumonia.  On exam, she has no tenderness abdomen.  No CVA tenderness would be concerning for pyelonephritis.  History/physical exam not concerning for kidney stone, cystitis, diverticulitis.  Plan to check urine.  I discussed with patient that given that her symptoms been going on for greater than 48 hours, she would not be eligible for treatment of flu.  Urine reviewed.  Urine pregnancy negative.  UA shows moderate hemoglobin, trace leukocytes, pyuria.  Given that patient is symptomatic, will plan to treat.  Urine culture sent.  Discussed results with patient.  Patient with no known drug allergies.  We will plan to start antibiotic for treatment of UTI.  Encourage at home supportive care measures. At  this time, patient exhibits no emergent life-threatening condition that require further evaluation in ED or admission.  Patient had ample opportunity for questions and discussion. All patient's questions were answered with full understanding. Strict return precautions discussed. Patient expresses understanding and agreement to plan.   Portions of this note were generated with [employer]. Dictation errors may occur despite best attempts at proofreading.  Final Clinical Impressions(s) / ED Diagnoses   Final diagnoses:  Influenza-like illness  Acute cystitis with hematuria    ED Discharge Orders         Ordered    cephALEXin (KEFLEX) 500 MG capsule  4 times daily     05/26/18 2150    fluticasone (FLONASE) 50 MCG/ACT nasal spray  Daily     05/26/18 2150    benzonatate (TESSALON) 100 MG capsule  Every 8 hours     05/26/18 2150           Desma Mcgregor 05/26/18 2157    Francine Graven, DO 05/29/18 1356

## 2018-05-26 NOTE — ED Notes (Signed)
Spec to lab

## 2018-05-26 NOTE — ED Notes (Signed)
Pt reports that father had flu like sx I week ago   She has experienced them for several day   No flu shot this year

## 2018-05-26 NOTE — Discharge Instructions (Signed)
You can take Tylenol or Ibuprofen as directed for pain. You can alternate Tylenol and Ibuprofen every 4 hours. If you take Tylenol at 1pm, then you can take Ibuprofen at 5pm. Then you can take Tylenol again at 9pm.   Take antibiotics as directed. Please take all of your antibiotics until finished.  As we discussed, your urine has been sent for culture.  If it grows out any specific bacteria, you will be notified.  As we discussed, use Flonase congestion Tessalon Perles for cough.  Make sure you are drinking plenty of fluids and staying hydrated.  Follow-up with your primary care doctor in the next 4 to 5 days for further evaluation.  Return the emergency department for any persistent fever despite medications, vomiting, nominal pain, difficulty breathing or any other worsening or concerning symptoms.

## 2018-05-28 LAB — URINE CULTURE
Culture: 30000 — AB
Special Requests: NORMAL

## 2018-06-05 ENCOUNTER — Emergency Department (HOSPITAL_COMMUNITY)
Admission: EM | Admit: 2018-06-05 | Discharge: 2018-06-05 | Disposition: A | Discharge status: Home / Self Care | Payer: No Typology Code available for payment source | Attending: Emergency Medicine | Admitting: Emergency Medicine

## 2018-06-05 ENCOUNTER — Encounter (HOSPITAL_COMMUNITY): Payer: Self-pay

## 2018-06-05 ENCOUNTER — Emergency Department (HOSPITAL_COMMUNITY): Payer: No Typology Code available for payment source

## 2018-06-05 DIAGNOSIS — Z79899 Other long term (current) drug therapy: Secondary | ICD-10-CM | POA: Diagnosis not present

## 2018-06-05 DIAGNOSIS — I471 Supraventricular tachycardia: Secondary | ICD-10-CM | POA: Insufficient documentation

## 2018-06-05 DIAGNOSIS — R000 Tachycardia, unspecified: Secondary | ICD-10-CM | POA: Diagnosis present

## 2018-06-05 HISTORY — DX: Supraventricular tachycardia, unspecified: I47.10

## 2018-06-05 HISTORY — DX: Supraventricular tachycardia: I47.1

## 2018-06-05 LAB — CBC WITH DIFFERENTIAL/PLATELET
Abs Immature Granulocytes: 0.02 10*3/uL (ref 0.00–0.07)
Basophils Absolute: 0 10*3/uL (ref 0.0–0.1)
Basophils Relative: 0 %
Eosinophils Absolute: 0.1 10*3/uL (ref 0.0–0.5)
Eosinophils Relative: 3 %
HCT: 44.8 % (ref 36.0–46.0)
Hemoglobin: 13.9 g/dL (ref 12.0–15.0)
Immature Granulocytes: 0 %
LYMPHS PCT: 46 %
Lymphs Abs: 2.4 10*3/uL (ref 0.7–4.0)
MCH: 26.2 pg (ref 26.0–34.0)
MCHC: 31 g/dL (ref 30.0–36.0)
MCV: 84.4 fL (ref 80.0–100.0)
Monocytes Absolute: 0.5 10*3/uL (ref 0.1–1.0)
Monocytes Relative: 10 %
Neutro Abs: 2.1 10*3/uL (ref 1.7–7.7)
Neutrophils Relative %: 41 %
Platelets: 364 10*3/uL (ref 150–400)
RBC: 5.31 MIL/uL — ABNORMAL HIGH (ref 3.87–5.11)
RDW: 14.3 % (ref 11.5–15.5)
WBC: 5.1 10*3/uL (ref 4.0–10.5)
nRBC: 0 % (ref 0.0–0.2)

## 2018-06-05 LAB — COMPREHENSIVE METABOLIC PANEL
ALT: 17 U/L (ref 0–44)
AST: 16 U/L (ref 15–41)
Albumin: 4 g/dL (ref 3.5–5.0)
Alkaline Phosphatase: 62 U/L (ref 38–126)
Anion gap: 8 (ref 5–15)
BUN: 9 mg/dL (ref 6–20)
CO2: 22 mmol/L (ref 22–32)
Calcium: 8.9 mg/dL (ref 8.9–10.3)
Chloride: 106 mmol/L (ref 98–111)
Creatinine, Ser: 0.7 mg/dL (ref 0.44–1.00)
GFR calc Af Amer: >60 mL/min (ref >60)
GFR calc non Af Amer: >60 mL/min (ref >60)
Glucose, Bld: 86 mg/dL (ref 70–99)
Potassium: 3.8 mmol/L (ref 3.5–5.1)
Sodium: 136 mmol/L (ref 135–145)
Total Bilirubin: 0.7 mg/dL (ref 0.3–1.2)
Total Protein: 7.7 g/dL (ref 6.5–8.1)

## 2018-06-05 LAB — TROPONIN I: Troponin I: <0.03 ng/mL (ref <0.03)

## 2018-06-05 LAB — D-DIMER, QUANTITATIVE: D-Dimer, Quant: 0.61 ug/mL-FEU — ABNORMAL HIGH (ref 0.00–0.50)

## 2018-06-05 MED ORDER — METOPROLOL TARTRATE 5 MG/5ML IV SOLN
2.5000 mg | Freq: Once | INTRAVENOUS | Status: AC
  Administered 2018-06-05: 2.5 mg via INTRAVENOUS

## 2018-06-05 MED ORDER — IOPAMIDOL (ISOVUE-370) INJECTION 76%
150.0000 mL | Freq: Once | INTRAVENOUS | Status: AC | PRN
  Administered 2018-06-05: 110 mL via INTRAVENOUS

## 2018-06-05 MED ORDER — METOPROLOL TARTRATE 5 MG/5ML IV SOLN
5.0000 mg | Freq: Once | INTRAVENOUS | Status: DC
  Filled 2018-06-05: qty 5

## 2018-06-05 MED ORDER — ADENOSINE 6 MG/2ML IV SOLN
12.0000 mg | Freq: Once | INTRAVENOUS | Status: AC
  Administered 2018-06-05: 12 mg via INTRAVENOUS
  Filled 2018-06-05: qty 4

## 2018-06-05 NOTE — ED Notes (Signed)
Patient given 1 liter of normal saline.

## 2018-06-05 NOTE — ED Triage Notes (Signed)
Pt reports she did not feel good last night. But when she got up she still did not feel well. Chest felt heavy and heart felt like it was racing

## 2018-06-05 NOTE — ED Notes (Signed)
Gave patient diet ginger ale as requested and approved by Dr Sabra Heck.

## 2018-06-05 NOTE — ED Provider Notes (Signed)
[facility] EMERGENCY DEPARTMENT Provider Note   CSN: [id number] Arrival date & time: 06/05/18  1357     History   Chief Complaint Chief Complaint  Patient presents with  . Tachycardia    HPI Jessica Sanchez is a 33 y.o. female.  HPI  The patient is a 33 year old female, she has a known history of bleeding uterine fibroids and thus has an IUD.  She states that she has had a history of fast heartbeat, she was seen in the emergency department in July 2019 and according to my review of the medical record had a narrow complex regular tachycardia which did not respond to adenosine with a sustained sinus rhythm but continued to go into a junctional tachycardia and ultimately a junctional rhythm.  She was finally discharged after her heart rate and rhythm seem to normalize, states that she went back into that same feeling of palpitations the next day but did not come back.  She had the symptoms for several days until it resolved.  She has not had them again since that time until last night when she started to feel a fluttering and a jittery feeling which persisted this morning and throughout the day.  She feels a heaviness and a palpitations in her chest with some shortness of breath but denies any coughing, fever.  She has not had any diarrhea rectal bleeding, vaginal bleeding or significant swelling in her legs though she did have a small amount of swelling in her left leg last night.  She does not have any other risk factors for pulmonary embolism, no travel, trauma, immobilization, recent surgery, exogenous estrogen use, tobacco use or prior or family history of hypercoagulable state.  She does not drink any significant amount of caffeine, energy drinks and has not been taking any over-the-counter medications at all.  She was recently ill approximately 2 weeks ago with an influenza-like illness but did not get the antibiotic filled.  She has done well since that time.  Past Medical History:    Diagnosis Date  . Fibroid (bleeding) (uterine)   . Fibroid, uterine   . H/O metrorrhagia   . IUD (intrauterine device) in place 07/11/2015  . Menorrhagia   . SVT (supraventricular tachycardia) [facility])     Patient Active Problem List   Diagnosis Date Noted  . IUD (intrauterine device) in place 07/11/2015  . Other disorder of menstruation and other abnormal bleeding from female genital tract 10/30/2012    Past Surgical History:  Procedure Laterality Date  . HYSTEROSCOPY W/D&C N/A 09/27/2013   Procedure: DILATATION AND CURETTAGE /HYSTEROSCOPY and insertion of Mirena IUD ;  Surgeon: [name]. Harrington Challenger, MD;  Location: [facility];  Service: Gynecology;  Laterality: N/A;  . MYOMECTOMY  02/03/2011   Procedure: MYOMECTOMY;  Surgeon: [name], MD;  Location: [facility];  Service: Gynecology;  Laterality: N/A;  . TONSILLECTOMY       OB History    Gravida  1   Para      Term      Preterm      AB  1   Living        SAB  1   TAB      Ectopic      Multiple      Live Births               Home Medications    Prior to Admission medications   Medication Sig Start Date End Date Taking? Authorizing Provider  albuterol (PROVENTIL HFA) 108 (90 Base) MCG/ACT inhaler Inhale 1-2 puffs into the lungs every 6 (six) hours as needed for wheezing or shortness of breath.    Yes [provider]  fluticasone (FLONASE) 50 MCG/ACT nasal spray Place 1 spray into both nostrils daily. 05/26/18  Yes Volanda Napoleon, PA-C  ibuprofen (ADVIL,MOTRIN) 600 MG tablet Take 1 tablet (600 mg total) by mouth every 6 (six) hours as needed. 05/06/18  Yes IdolAlmyra Free, PA-C  levonorgestrel (MIRENA) 20 MCG/24HR IUD 1 each by Intrauterine route once. EVERY 5 YEARS   Yes [provider]  Multiple Vitamins-Calcium (ONE-A-DAY WOMENS PO) Take 1 tablet by mouth daily.   Yes [provider]    Family History Family History  Problem Relation Age of Onset  . Cirrhosis Mother   . Diabetes Mother    . Cancer Maternal Grandfather   . Hypertension Sister   . Diabetes Sister   . Anesthesia problems Neg Hx   . Hypotension Neg Hx   . Malignant hyperthermia Neg Hx   . Pseudochol deficiency Neg Hx     Social History Social History   Tobacco Use  . Smoking status: Never Smoker  . Smokeless tobacco: Never Used  Substance Use Topics  . Alcohol use: Yes    Comment: occasional  . Drug use: No     Allergies   Shellfish allergy and Adhesive [tape]   Review of Systems Review of Systems  All other systems reviewed and are negative.    Physical Exam Updated Vital Signs BP 101/71   Pulse 83   Temp 98.6 F (37 C) (Oral)   Resp (!) 21   Ht 1.575 m (5\' 2" )   Wt 120.2 kg   SpO2 100%   BMI 48.47 kg/m   Physical Exam Vitals signs and nursing note reviewed.  Constitutional:      General: She is not in acute distress.    Appearance: She is well-developed.  HENT:     Head: Normocephalic and atraumatic.     Mouth/Throat:     Pharynx: No oropharyngeal exudate.  Eyes:     General: No scleral icterus.       Right eye: No discharge.        Left eye: No discharge.     Conjunctiva/sclera: Conjunctivae normal.     Pupils: Pupils are equal, round, and reactive to light.  Neck:     Musculoskeletal: Normal range of motion and neck supple.     Thyroid: No thyromegaly.     Vascular: No JVD.  Cardiovascular:     Rate and Rhythm: Regular rhythm. Tachycardia present.     Heart sounds: Normal heart sounds. No murmur. No friction rub. No gallop.   Pulmonary:     Effort: Pulmonary effort is normal. No respiratory distress.     Breath sounds: Normal breath sounds. No wheezing or rales.  Abdominal:     General: Bowel sounds are normal. There is no distension.     Palpations: Abdomen is soft. There is no mass.     Tenderness: There is no abdominal tenderness.  Musculoskeletal: Normal range of motion.        General: No tenderness.     Right lower leg: No edema.     Left lower leg:  No edema.  Lymphadenopathy:     Cervical: No cervical adenopathy.  Skin:    General: Skin is warm and dry.     Findings: No erythema or rash.  Neurological:  Mental Status: She is alert.     Coordination: Coordination normal.  Psychiatric:        Behavior: Behavior normal.      ED Treatments / Results  Labs (all labs ordered are listed, but only abnormal results are displayed) Labs Reviewed  CBC WITH DIFFERENTIAL/PLATELET - Abnormal; Notable for the following components:      Result Value   RBC 5.31 (*)    All other components within normal limits  D-DIMER, QUANTITATIVE (NOT AT [facility]) - Abnormal; Notable for the following components:   D-Dimer, Quant 0.61 (*)    All other components within normal limits  COMPREHENSIVE METABOLIC PANEL  TROPONIN I  URINALYSIS, ROUTINE W REFLEX MICROSCOPIC    EKG EKG Interpretation  Date/Time:  Monday June 05 2018 14:50:35 EST Ventricular Rate:  149 PR Interval:    QRS Duration: 74 QT Interval:  292 QTC Calculation: 459 R Axis:   26 Text Interpretation:  Supraventricular tachycardia Minimal voltage criteria for LVH, may be normal variant Borderline ECG Since last tracing aflutter with 2:1 block now present Confirmed by Noemi Chapel [phone]) on 06/05/2018 2:59:34 PM   EKG Interpretation  Date/Time:  Monday June 05 2018 16:57:35 EST Ventricular Rate:  82 PR Interval:    QRS Duration: 163 QT Interval:  381 QTC Calculation: 445 R Axis:   23 Text Interpretation:  Sinus rhythm Nonspecific intraventricular conduction delay Normal sinus rhythm Since last tracing Supraventricular tachycardia has resolved Confirmed by Noemi Chapel [phone]) on 06/05/2018 5:18:24 PM        Radiology Dg Chest 2 View  Result Date: 06/05/2018 CLINICAL DATA:  Sensation of chest heaviness beginning last night. EXAM: CHEST - 2 VIEW COMPARISON:  PA and lateral chest 02/28/2018 and 01/11/2017. CT chest 12/28/2016. FINDINGS: The lungs are clear. Heart size  is upper normal. No pneumothorax or pleural effusion. No acute or focal bony abnormality. IMPRESSION: No acute disease. Electronically Signed   By: Inge Rise M.D.   On: 06/05/2018 15:59   Ct Angio Chest Pe W And/or Wo Contrast  Result Date: 06/05/2018 CLINICAL DATA:  Pt reports she did not feel good last night. But when she got up she still did not feel well. Chest felt heavy and heart felt like it was racing^160mL ISOVUE-370 IOPAMIDOL (ISOVUE-370) INJECTION 76% EXAM: CT ANGIOGRAPHY CHEST WITH CONTRAST TECHNIQUE: Multidetector CT imaging of the chest was performed using the standard protocol during bolus administration of intravenous contrast. Multiplanar CT image reconstructions and MIPs were obtained to evaluate the vascular anatomy. CONTRAST:  133mL ISOVUE-370 IOPAMIDOL (ISOVUE-370) INJECTION 76% COMPARISON:  12/28/2016 FINDINGS: Cardiovascular: Satisfactory opacification of the pulmonary arteries to the segmental level. No evidence of pulmonary embolism. Normal heart size. No pericardial effusion. Normal caliber thoracic aorta. No thoracic aortic dissection. Mediastinum/Nodes: No enlarged mediastinal, hilar, or axillary lymph nodes. Soft tissue prominence in the anterior mediastinum likely reflecting thymic remanent similar in appearance to the prior CT of the chest dated 12/28/2016 thyroid gland, trachea, and esophagus demonstrate no significant findings. Lungs/Pleura: Lungs are clear. No pleural effusion or pneumothorax. Upper Abdomen: No acute abnormality. Musculoskeletal: No chest wall abnormality. No acute or significant osseous findings. Review of the MIP images confirms the above findings. IMPRESSION: 1. No evidence of pulmonary embolus. 2. No thoracic aortic aneurysm or dissection. Electronically Signed   By: Kathreen Devoid   On: 06/05/2018 19:26    Procedures Procedures (including critical care time)  Medications Ordered in ED Medications  adenosine (ADENOCARD) 6 MG/2ML injection 12 mg  (  12 mg Intravenous Given by Other 06/05/18 1525)  metoprolol tartrate (LOPRESSOR) injection 2.5 mg (2.5 mg Intravenous Given by Other 06/05/18 1528)  iopamidol (ISOVUE-370) 76 % injection 150 mL (110 mLs Intravenous Contrast Given 06/05/18 1901)     Initial Impression / Assessment and Plan / ED Course  I have reviewed the triage vital signs and the nursing notes.  Pertinent labs & imaging results that were available during my care of the patient were reviewed by me and considered in my medical decision making (see chart for details).  Clinical Course as of Jun 05 1930  Mon Jun 05, 2018  1530 After single dose of 12 mg of adenosine there was successful conversion to normal sinus rhythm.  Current heart rate is approximately 100 bpm, symptoms have improved remarkably.   [BM]  1930 The patient's blood pressure is 106/70, heart rate of 83, CT scan shows no signs of pulmonary embolism or other pulmonary abnormalities, troponin is negative despite her having symptoms for more than 12 hours and without any other risk factors for heart disease I think that would be much less likely.  At this time she appears stable for discharge, I am resistant to start her on any medication to reduce heart rate given her blood pressure which is just over 381 systolic.  I have stressed the importance of a cardiology follow-up and she has expressed her understanding to this and now that this is happened a second time she is willing to see them.  She is aware of the indications for return.   [BM]    Clinical Course User Index [BM] Noemi Chapel, MD    The patient is tachycardic and what appears to be either atrial flutter with 2-1 block or junctional tachycardia.  This may also be SVT.  We will start with adenosine 12 mg as was done last time with documented sinus rhythm as a result.  If this does not work we will go to AV nodal blocking agents and discuss with cardiology.  The patient is not anticoagulated, has no other risk  factors for pulmonary embolism or acute coronary syndrome, d-dimer since the patient has had some left leg symptoms and now has recurrent tachycardia.  On repeat exam she is no longer tachycardic and has been in a sinus rhythm ever since the adenosine was given, stable for discharge  Final Clinical Impressions(s) / ED Diagnoses   Final diagnoses:  SVT (supraventricular tachycardia) [facility])    ED Discharge Orders    None       Noemi Chapel, MD 06/05/18 1932

## 2018-06-05 NOTE — Discharge Instructions (Signed)
Please avoid medications that are over-the-counter for cough cold or allergies.  Please avoid drinking caffeine or any other stimulants including coffee tea or alcohol.  Please drink plenty of clear liquids, seek medical exam within the next week at the cardiology office.  I have given you their number.  Please call today to make an appointment.  Return to the emergency department for severe worsening symptoms.

## 2018-06-09 ENCOUNTER — Ambulatory Visit (INDEPENDENT_AMBULATORY_CARE_PROVIDER_SITE_OTHER): Payer: No Typology Code available for payment source | Admitting: Cardiovascular Disease

## 2018-06-09 ENCOUNTER — Encounter: Payer: Self-pay | Admitting: Cardiovascular Disease

## 2018-06-09 VITALS — BP 108/62 | HR 72 | Ht 62.0 in | Wt 288.0 lb

## 2018-06-09 DIAGNOSIS — I471 Supraventricular tachycardia: Secondary | ICD-10-CM

## 2018-06-09 DIAGNOSIS — Z9289 Personal history of other medical treatment: Secondary | ICD-10-CM

## 2018-06-09 MED ORDER — DILTIAZEM HCL 30 MG PO TABS: 30.0000 mg | ORAL_TABLET | Freq: Two times a day (BID) | ORAL | 3 refills | Status: DC

## 2018-06-09 NOTE — Progress Notes (Signed)
CARDIOLOGY CONSULT NOTE  Patient ID: SHARNIKA BINNEY MRN: [id number] DOB/AGE: [birth year] 33 y.o.  Admit date: (Not on file) Primary Physician: Lucia Gaskins, MD Referring Physician: Lucia Gaskins, MD  Reason for Consultation: Tachycardia  HPI: Jessica Sanchez is a 33 y.o. female who is being seen today for the evaluation of tachycardia at the request of Lucia Gaskins, MD.   She was evaluated in the ED on 06/05/2018.  I personally reviewed all relevant documentation, labs, and studies.  She was administered 12 MG of IV adenosine and converted to sinus rhythm for SVT.  CT angiogram of the chest was normal.  I personally reviewed the ECG performed at 1450 which showed SVT, 149 bpm.  There were no obvious flutter waves.  Subsequent ECG at 1657 showed normal sinus rhythm.  She was evaluated for what was deemed to be noncardiac chest pain in the ED on 11/27/2017.  She was evaluated for tachycardia in the ED on 11/19/2017.  I personally reviewed the ECG performed at 1223 which showed SVT, 144 bpm.  ECG at 1747 showed an accelerated junctional rhythm with a heart rate in the 80 bpm range.  Based upon documentation, she was given metoprolol and then a diltiazem infusion.  She was discharged on Lopressor 25 mg twice daily.  She has had occasional palpitations since her most recent ED presentation.  When she was prescribed Lopressor in July 2019, she said she could not afford it.  She has occasional lightheadedness and dizziness but denies syncope.  She denies exertional chest pain and dyspnea.  She works anywhere from 8 to 12 hours/day 6 to 7 days/week at [employer].  I reviewed labs dated 06/05/2018: White blood cells 5.1, hemoglobin 13.9, platelets 364, normal comprehensive metabolic panel, normal troponins, and elevated d-dimer of 0.61.  Family history: Mother died in 2006/08/05 in her late 40s of cirrhosis of the liver.  She apparently had some cardiac issues.  Her  mother sister died of kidney disease in her 57s.  She has a younger sister with some cardiac issues and an older sister with some cardiac issues.    Allergies  Allergen Reactions  . Shellfish Allergy Shortness Of Breath, Itching and Swelling    Mouth became swollen, throat started itching, and developed shortness of breath  . Adhesive [Tape] Itching    Depends on the type of medical tape    Current Outpatient Medications  Medication Sig Dispense Refill  . albuterol (PROVENTIL HFA) 108 (90 Base) MCG/ACT inhaler Inhale 1-2 puffs into the lungs every 6 (six) hours as needed for wheezing or shortness of breath.     Marland Kitchen ibuprofen (ADVIL,MOTRIN) 600 MG tablet Take 1 tablet (600 mg total) by mouth every 6 (six) hours as needed. 20 tablet 0  . levonorgestrel (MIRENA) 20 MCG/24HR IUD 1 each by Intrauterine route once. EVERY 5 YEARS    . Multiple Vitamins-Calcium (ONE-A-DAY WOMENS PO) Take 1 tablet by mouth daily.     No current facility-administered medications for this visit.     Past Medical History:  Diagnosis Date  . Fibroid (bleeding) (uterine)   . Fibroid, uterine   . H/O metrorrhagia   . IUD (intrauterine device) in place 07/11/2015  . Menorrhagia   . SVT (supraventricular tachycardia) ([facility])     Past Surgical History:  Procedure Laterality Date  . HYSTEROSCOPY W/D&C N/A 09/27/2013   Procedure: DILATATION AND CURETTAGE /HYSTEROSCOPY and insertion of Mirena IUD ;  Surgeon: [name]. Harrington Challenger,  MD;  Location: [facility];  Service: Gynecology;  Laterality: N/A;  . MYOMECTOMY  02/03/2011   Procedure: MYOMECTOMY;  Surgeon: [name], MD;  Location: [facility];  Service: Gynecology;  Laterality: N/A;  . TONSILLECTOMY      Social History   Socioeconomic History  . Marital status: Single    Spouse name: Not on file  . Number of children: Not on file  . Years of education: Not on file  . Highest education level: Not on file  Occupational History  . Not on file  Social Needs  . Financial  resource strain: Not on file  . Food insecurity:    Worry: Not on file    Inability: Not on file  . Transportation needs:    Medical: Not on file    Non-medical: Not on file  Tobacco Use  . Smoking status: Never Smoker  . Smokeless tobacco: Never Used  Substance and Sexual Activity  . Alcohol use: Yes    Comment: occasional  . Drug use: No  . Sexual activity: Yes    Birth control/protection: I.U.D.  Lifestyle  . Physical activity:    Days per week: Not on file    Minutes per session: Not on file  . Stress: Not on file  Relationships  . Social connections:    Talks on phone: Not on file    Gets together: Not on file    Attends religious service: Not on file    Active member of club or organization: Not on file    Attends meetings of clubs or organizations: Not on file    Relationship status: Not on file  . Intimate partner violence:    Fear of current or ex partner: Not on file    Emotionally abused: Not on file    Physically abused: Not on file    Forced sexual activity: Not on file  Other Topics Concern  . Not on file  Social History Narrative  . Not on file      Current Meds  Medication Sig  . albuterol (PROVENTIL HFA) 108 (90 Base) MCG/ACT inhaler Inhale 1-2 puffs into the lungs every 6 (six) hours as needed for wheezing or shortness of breath.   Marland Kitchen ibuprofen (ADVIL,MOTRIN) 600 MG tablet Take 1 tablet (600 mg total) by mouth every 6 (six) hours as needed.  Marland Kitchen levonorgestrel (MIRENA) 20 MCG/24HR IUD 1 each by Intrauterine route once. EVERY 5 YEARS  . Multiple Vitamins-Calcium (ONE-A-DAY WOMENS PO) Take 1 tablet by mouth daily.  . [DISCONTINUED] fluticasone (FLONASE) 50 MCG/ACT nasal spray Place 1 spray into both nostrils daily.      Review of systems complete and found to be negative unless listed above in HPI    Physical exam Blood pressure 108/62, pulse 72, height 5\' 2"  (1.575 m), weight 288 lb (130.6 kg), SpO2 98 %. General: NAD Neck: No JVD, no  thyromegaly or thyroid nodule.  Lungs: Clear to auscultation bilaterally with normal respiratory effort. CV: Nondisplaced PMI. Regular rate and rhythm, normal S1/S2, no S3/S4, no murmur.  No peripheral edema.    Abdomen: Soft, nontender, obese.  Skin: Intact without lesions or rashes.  Neurologic: Alert and oriented x 3.  Psych: Normal affect. Extremities: No clubbing or cyanosis.  HEENT: Normal.   ECG: Most recent ECG reviewed.   Labs: Lab Results  Component Value Date/Time   K 3.8 06/05/2018 03:13 PM   BUN 9 06/05/2018 03:13 PM   CREATININE 0.70 06/05/2018 03:13 PM  ALT 17 06/05/2018 03:13 PM   TSH 2.295 11/19/2017 12:40 PM   HGB 13.9 06/05/2018 03:13 PM     Lipids: No results found for: LDLCALC, LDLDIRECT, CHOL, TRIG, HDL      ASSESSMENT AND PLAN:  1.  SVT: She has had recurrent SVT.  I would like for her to be evaluated by EP for consideration of an EP study and potential ablation if deemed necessary and feasible.  I will initially try diltiazem 30 mg twice daily to see if this can help to suppress paroxysms of SVT. I will order a 2-D echocardiogram with Doppler to evaluate cardiac structure, function, and regional wall motion.      Disposition: Follow up with EP.  Follow-up with me to be determined.  Signed: Kate Sable, M.D., F.A.C.C.  06/09/2018, 10:08 AM

## 2018-06-09 NOTE — Patient Instructions (Addendum)
Supraventricular Tachycardia, Adult  Supraventricular tachycardia (SVT) is a kind of abnormal heartbeat. It makes your heart beat very fast and then beat at a normal speed.  A normal resting heartbeat is 60-100 times a minute. This condition can make your heart beat more than 150 times a minute. Times of having a fast heartbeat (episodes) can be scary, but they are usually not dangerous. However, in some cases, they can lead to heart failure if:  · They happen several times per day.  · Last for longer than a few seconds.  What are the causes?  Usually, a normal heartbeat starts when an area called the sinoatrial node releases an electrical signal. In SVT, other areas of the heart send out electrical signals that interfere with the signal from the sinoatrial node.  What increases the risk?  · Being 12?33 years old.  · Being a woman.  The following factors may make you more likely to develop this condition:  · Stress.  · Tiredness.  · Smoking.  · Stimulant drugs, such as cocaine and methamphetamine  · Alcohol.  · Caffeine.  · Pregnancy.  · Anxiety.  What are the signs or symptoms?  · A pounding heart.  ? A feeling that your heart is skipping beats (palpitations).  ? Weakness.  ? Trouble getting enough air.  ? Pain or tightness in your chest.  ? Feeling like you are going to pass out.  ? Feeling worried or nervous.  ? Dizziness.  ? Sweating.  ? Feeling like you might throw up.  ? Passing out (fainting).  ? Tiredness.  Sometimes, there are no symptoms.  How is this treated?  · Vagal nerve stimulation. Ways to do this:  ? Holding your breath and pushing, as though you are pooping.  ? Massaging an area on one side of your neck. Do not try this yourself. Only a doctor should do this. If done the wrong way, it can lead to a stroke.  ? Bending forward with your head between your legs.  ? Coughing while bending forward with your head between your legs.  ? Closing your eyes and massaging your eyeballs. Ask a doctor how to do  this.  · Medicines that prevent attacks.  · Medicine to stop an attack given through an IV at the hospital.  · A small electric shock (cardioversion) that stops an attack.  · Radiofrequency ablation. In this procedure, a small, thin tube (catheter) is used to send radiofrequency energy to the area that is causing the rapid heartbeats.  Follow these instructions at home:  Stress    · Avoid things that make you feel stressed.  · To deal with stress, try:  ? Doing yoga, meditation, or being out in nature.  ? Listening to relaxing music.  ? Doing deep breathing.  ? Taking steps to be healthy, such as getting lots of sleep, exercising, and eating a balanced diet.  ? Talking with a mental health doctor.  Sleep  · Try to get at least 7 hours of sleep each night.  Tobacco and nicotine    · Do not use any products that contain nicotine or tobacco, such as cigarettes, e-cigarettes, and chewing tobacco. If you need help quitting, ask your doctor.  Alcohol  · If alcohol gives you a fast heartbeat, do not drink alcohol.  · Even in alcohol does not seem to give you a fast heartbeat, limit alcohol use,.  ? For nonpregnant women, this means no more than 1   in it.  Even if caffeine does not seem to give you a fast heartbeat, limit how much caffeine you eat, drink, or use. Stimulant drugs  Do not use drugs such as cocaine or methamphetamine. If you need help quitting, ask your doctor. General instructions  Stay at a healthy weight.  Exercise regularly. Ask your doctor about good activities for you. Try one of these: ? 150 minutes a week of gentle exercise, like walking or yoga. ? 75 minutes a week of exercise that is very active, like  running or swimming.  Try a combination of gentle exercise and very active exercise.  Do home treatments to slow down your heartbeat.  Take over-the-counter and prescription medicines only as told by your doctor. Contact a doctor if:  You have a fast heartbeat more often.  Times of having a fast heartbeat last longer than before.  Home treatments to slow down your heartbeat do not help.  You have new symptoms. Get help right away if:  You have chest pain.  Your symptoms get worse.  You have trouble breathing.  Your heart beats very fast for more than 20 minutes.  You pass out. These symptoms may be an emergency. Do not wait to see if the symptoms will go away. Get medical help right away. Call your local emergency services (911 in the [place] Do not drive yourself to the hospital. Summary  SVT is a type of abnormal heart beat.  During an episode, our heart rate may be higher than 150 beats per minute  Treatment depends on how often the condition happens and your symptoms. This information is not intended to replace advice given to you by your health care provider. Make sure you discuss any questions you have with your health care provider. Document Released: 04/19/2005 Document Revised: 12/25/2015 Document Reviewed: 12/25/2015 Elsevier Interactive Patient Education  2019 [employer]      Medication Instructions: START diltiazem 30 mg twice a day  Labwork: None  Procedures/Testing: Your physician has requested that you have an echocardiogram. Echocardiography is a painless test that uses sound waves to create images of your heart. It provides your doctor with information about the size and shape of your heart and how well your heart's chambers and valves are working. This procedure takes approximately one hour. There are no restrictions for this procedure.    Follow-Up: To be determined  Any Additional Special Instructions Will Be Listed Below (If  Applicable).  We will refer you to Dr.Taylor, Electrophysiologist   If you need a refill on your cardiac medications before your next appointment, please call your pharmacy.

## 2018-06-12 ENCOUNTER — Telehealth: Payer: Self-pay | Admitting: Cardiovascular Disease

## 2018-06-12 ENCOUNTER — Ambulatory Visit (HOSPITAL_COMMUNITY)
Admission: RE | Admit: 2018-06-12 | Discharge: 2018-06-12 | Disposition: A | Discharge status: Home / Self Care | Payer: No Typology Code available for payment source | Source: Ambulatory Visit | Attending: Cardiovascular Disease | Admitting: Cardiovascular Disease

## 2018-06-12 DIAGNOSIS — I471 Supraventricular tachycardia: Secondary | ICD-10-CM | POA: Insufficient documentation

## 2018-06-12 NOTE — Telephone Encounter (Signed)
Attempt to reach, both home and cell phone have full vm-cc

## 2018-06-12 NOTE — Telephone Encounter (Signed)
I spoke with Jessica Sanchez D and she states diltiazem more likely to cause constipation. Shall I have her call her pcp?

## 2018-06-12 NOTE — Progress Notes (Signed)
*  PRELIMINARY RESULTS* Echocardiogram 2D Echocardiogram has been performed.  Jessica Sanchez 06/12/2018, 10:17 AM

## 2018-06-12 NOTE — Telephone Encounter (Signed)
Pt called back.She will speak with pcp and continue cardizem

## 2018-06-12 NOTE — Telephone Encounter (Signed)
Pt states the new medicine she started Friday is giving her diarrhea and making her feel weak.

## 2018-06-12 NOTE — Telephone Encounter (Signed)
yes

## 2018-06-23 ENCOUNTER — Other Ambulatory Visit: Payer: Self-pay

## 2018-06-23 ENCOUNTER — Emergency Department (HOSPITAL_COMMUNITY)
Admission: EM | Admit: 2018-06-23 | Discharge: 2018-06-23 | Disposition: A | Discharge status: Home / Self Care | Payer: No Typology Code available for payment source | Attending: Emergency Medicine | Admitting: Emergency Medicine

## 2018-06-23 ENCOUNTER — Encounter (HOSPITAL_COMMUNITY): Payer: Self-pay | Admitting: Emergency Medicine

## 2018-06-23 ENCOUNTER — Emergency Department (HOSPITAL_COMMUNITY): Payer: No Typology Code available for payment source

## 2018-06-23 DIAGNOSIS — R0981 Nasal congestion: Secondary | ICD-10-CM | POA: Diagnosis present

## 2018-06-23 DIAGNOSIS — J069 Acute upper respiratory infection, unspecified: Secondary | ICD-10-CM | POA: Diagnosis not present

## 2018-06-23 DIAGNOSIS — Z79899 Other long term (current) drug therapy: Secondary | ICD-10-CM | POA: Insufficient documentation

## 2018-06-23 LAB — INFLUENZA PANEL BY PCR (TYPE A & B)
INFLBPCR: NEGATIVE
Influenza A By PCR: NEGATIVE

## 2018-06-23 MED ORDER — BENZONATATE 100 MG PO CAPS: 200.0000 mg | ORAL_CAPSULE | Freq: Three times a day (TID) | ORAL | 0 refills | Status: DC | PRN

## 2018-06-23 NOTE — ED Triage Notes (Signed)
PT c/o sneezing, coughing, nasal congestion with yellow drainage x2 days.

## 2018-06-23 NOTE — Discharge Instructions (Addendum)
Your flu test is negative.  Treat your symptoms, you may take the Tessalon for assistance with your cough.  I also recommend either Claritin or Zyrtec, no prescription needed for assistance with your nasal congestion.  Rest and make sure you are drinking plenty of fluids.

## 2018-06-25 NOTE — ED Provider Notes (Signed)
[facility] EMERGENCY DEPARTMENT Provider Note   CSN: [id number] Arrival date & time: 06/23/18  1317    History   Chief Complaint Chief Complaint  Patient presents with  . Nasal Congestion    HPI Jessica Sanchez is a 33 y.o. female presenting with a 2 day history of uri type symptoms which includes nasal congestion with yellow rhinorrhea, sneezing, nonproductive cough and generalized fatigue.  Symptoms do not include shortness of breath, wheezing, chest pain,  Nausea, vomiting or diarrhea.  The patient has taken no medications prior to arrival.  She does endorse possible exposure to the flu and was concerned about this possible infection.     The history is provided by the patient.    Past Medical History:  Diagnosis Date  . Fibroid (bleeding) (uterine)   . Fibroid, uterine   . H/O metrorrhagia   . IUD (intrauterine device) in place 07/11/2015  . Menorrhagia   . SVT (supraventricular tachycardia) [facility])     Patient Active Problem List   Diagnosis Date Noted  . IUD (intrauterine device) in place 07/11/2015  . Other disorder of menstruation and other abnormal bleeding from female genital tract 10/30/2012    Past Surgical History:  Procedure Laterality Date  . HYSTEROSCOPY W/D&C N/A 09/27/2013   Procedure: DILATATION AND CURETTAGE /HYSTEROSCOPY and insertion of Mirena IUD ;  Surgeon: [name]. Harrington Challenger, MD;  Location: [facility];  Service: Gynecology;  Laterality: N/A;  . MYOMECTOMY  02/03/2011   Procedure: MYOMECTOMY;  Surgeon: [name], MD;  Location: [facility];  Service: Gynecology;  Laterality: N/A;  . TONSILLECTOMY       OB History    Gravida  1   Para      Term      Preterm      AB  1   Living        SAB  1   TAB      Ectopic      Multiple      Live Births               Home Medications    Prior to Admission medications   Medication Sig Start Date End Date Taking? Authorizing Provider  albuterol (PROVENTIL HFA) 108 (90 Base) MCG/ACT inhaler  Inhale 1-2 puffs into the lungs every 6 (six) hours as needed for wheezing or shortness of breath.     [provider]  benzonatate (TESSALON) 100 MG capsule Take 2 capsules (200 mg total) by mouth 3 (three) times daily as needed. 06/23/18   [name], PA-C  diltiazem (CARDIZEM) 30 MG tablet Take 1 tablet (30 mg total) by mouth 2 (two) times daily. 06/09/18   Herminio Commons, MD  ibuprofen (ADVIL,MOTRIN) 600 MG tablet Take 1 tablet (600 mg total) by mouth every 6 (six) hours as needed. 05/06/18   [name], PA-C  levonorgestrel (MIRENA) 20 MCG/24HR IUD 1 each by Intrauterine route once. EVERY 5 YEARS    [provider]  Multiple Vitamins-Calcium (ONE-A-DAY WOMENS PO) Take 1 tablet by mouth daily.    [provider]    Family History Family History  Problem Relation Age of Onset  . Cirrhosis Mother   . Diabetes Mother   . Cancer Maternal Grandfather   . Hypertension Sister   . Diabetes Sister   . Anesthesia problems Neg Hx   . Hypotension Neg Hx   . Malignant hyperthermia Neg Hx   . Pseudochol deficiency Neg Hx  Social History Social History   Tobacco Use  . Smoking status: Never Smoker  . Smokeless tobacco: Never Used  Substance Use Topics  . Alcohol use: Yes    Comment: occasional  . Drug use: No     Allergies   Shellfish allergy and Adhesive [tape]   Review of Systems Review of Systems  Constitutional: Positive for fever. Negative for chills.  HENT: Positive for congestion, rhinorrhea and sneezing. Negative for ear pain, sinus pressure, sinus pain, sore throat, trouble swallowing and voice change.   Eyes: Negative for discharge.  Respiratory: Positive for cough. Negative for shortness of breath, wheezing and stridor.   Cardiovascular: Negative for chest pain.  Gastrointestinal: Negative for abdominal pain.  Genitourinary: Negative.      Physical Exam Updated Vital Signs BP 117/64 (BP Location: Right Arm)   Pulse 78   Temp 97.9  F (36.6 C) (Oral)   Resp 14   SpO2 100%   Physical Exam Vitals signs and nursing note reviewed.  Constitutional:      Appearance: She is well-developed.  HENT:     Head: Normocephalic and atraumatic.     Right Ear: Tympanic membrane and ear canal normal.     Left Ear: Tympanic membrane and ear canal normal.     Nose: Mucosal edema, congestion and rhinorrhea present.     Mouth/Throat:     Pharynx: Uvula midline. No oropharyngeal exudate or posterior oropharyngeal erythema.     Tonsils: No tonsillar abscesses.  Eyes:     Conjunctiva/sclera: Conjunctivae normal.  Cardiovascular:     Rate and Rhythm: Normal rate.     Heart sounds: Normal heart sounds.  Pulmonary:     Effort: Pulmonary effort is normal. No respiratory distress.     Breath sounds: No stridor. No wheezing, rhonchi or rales.  Abdominal:     Palpations: Abdomen is soft.     Tenderness: There is no abdominal tenderness.  Musculoskeletal: Normal range of motion.  Skin:    General: Skin is warm and dry.     Findings: No rash.  Neurological:     Mental Status: She is alert and oriented to person, place, and time.      ED Treatments / Results  Labs (all labs ordered are listed, but only abnormal results are displayed) Labs Reviewed  INFLUENZA PANEL BY PCR (TYPE A & B)    EKG None  Radiology Dg Chest 2 View  Result Date: 06/23/2018 CLINICAL DATA:  Chest pain.  Cough. EXAM: CHEST - 2 VIEW COMPARISON:  CT 06/05/2018.  Chest x-ray 06/05/2018. FINDINGS: Mediastinum and hilar structures normal. Cardiomegaly noted on today's exam. Low lung volumes. No focal alveolar infiltrate. No pleural effusion or pneumothorax. No acute bony abnormality. IMPRESSION: 1.  Cardiomegaly noted today's exam.  No evidence of overt CHF. 2.  Low lung volumes with mild bibasilar atelectasis. Electronically Signed   By: Marcello Moores  Register   On: 06/23/2018 15:04    Procedures Procedures (including critical care time)  Medications Ordered in  ED Medications - No data to display   Initial Impression / Assessment and Plan / ED Course  I have reviewed the triage vital signs and the nursing notes.  Pertinent labs & imaging results that were available during my care of the patient were reviewed by me and considered in my medical decision making (see chart for details).        Labs reviewed and discussed.  Patient's history and exam suggesting viral URI.  We discussed home  care, Tessalon prescribed for cough.  PRN follow-up anticipated.  Final Clinical Impressions(s) / ED Diagnoses   Final diagnoses:  Viral upper respiratory tract infection    ED Discharge Orders         Ordered    benzonatate (TESSALON) 100 MG capsule  3 times daily PRN     06/23/18 1640           [name], PA-C 06/25/18 1587    Milton Ferguson, MD 06/26/18 2253

## 2018-07-11 ENCOUNTER — Telehealth: Payer: Self-pay | Admitting: Cardiovascular Disease

## 2018-07-11 NOTE — Telephone Encounter (Signed)
Patient would like to speak with nurse regarding symptoms of SVT. Patient requested appointment for sooner than 3/26 w/ Lovena Le but he is not here. / tg

## 2018-07-12 NOTE — Telephone Encounter (Signed)
Returned pt call. She states that she has been having issues with her SVT. She states that she is having episodes that are lasting longer. She asks if she can take an extra dose of cardizem 30 mg when she has these episodes. Please advise. I will forward to Dr. Bronson Ing to advise as pt has not saw Dr. Lovena Le as of yet.

## 2018-07-13 MED ORDER — DILTIAZEM HCL 30 MG PO TABS: 30.0000 mg | ORAL_TABLET | Freq: Three times a day (TID) | ORAL | 3 refills | Status: DC

## 2018-07-13 NOTE — Telephone Encounter (Signed)
Pt made aware. She will see Dr. Lovena Le on 07/27/2018.

## 2018-07-13 NOTE — Telephone Encounter (Signed)
Yes, that would be fine.  How quickly can she get in with EP?

## 2018-07-26 ENCOUNTER — Telehealth: Payer: Self-pay | Admitting: Internal Medicine

## 2018-07-26 NOTE — Telephone Encounter (Signed)
Pt is requesting to keep her apt w/ Dr. Lovena Le, states she's still having problems. Could someone please give her a call?

## 2018-07-26 NOTE — Telephone Encounter (Signed)
Spoke with pt. She states that she will reschedule to see Dr. Lovena Le as she does not with evisit or phone conversation.

## 2018-07-27 ENCOUNTER — Ambulatory Visit: Payer: Self-pay | Admitting: Internal Medicine

## 2018-09-15 ENCOUNTER — Encounter: Payer: Self-pay | Admitting: Adult Health

## 2018-09-15 ENCOUNTER — Other Ambulatory Visit: Payer: Self-pay

## 2018-09-15 ENCOUNTER — Ambulatory Visit (INDEPENDENT_AMBULATORY_CARE_PROVIDER_SITE_OTHER): Payer: No Typology Code available for payment source | Admitting: Adult Health

## 2018-09-15 VITALS — Ht 62.0 in

## 2018-09-15 DIAGNOSIS — Z975 Presence of (intrauterine) contraceptive device: Secondary | ICD-10-CM | POA: Diagnosis not present

## 2018-09-15 DIAGNOSIS — N926 Irregular menstruation, unspecified: Secondary | ICD-10-CM

## 2018-09-15 NOTE — Progress Notes (Signed)
Patient ID: Jessica Sanchez, female   DOB: [birth year], 33 y.o.   MRN: [id number]   TELEHEALTH VIRTUAL GYNECOLOGY VISIT ENCOUNTER NOTE  I connected with Jessica Sanchez on 09/15/18 at 11:45 AM EDT by telephone at home and verified that I am speaking with the correct person using two identifiers.   I discussed the limitations, risks, security and privacy concerns of performing an evaluation and management service by telephone and the availability of in person appointments. I also discussed with the patient that there may be a patient responsible charge related to this service. The patient expressed understanding and agreed to proceed.   History:  TERENCE GOOGE is a 33 y.o. [id number] female being evaluated today for having had 1+HPT and then 3 negative HPTs, and has IUD, which was inserted May 2014 in [place].She has not had a period since getting IUD, had myomectomy then got IUD to stop periods. She wants another IUD she thinks.  She denies any abnormal vaginal discharge, bleeding, pelvic pain or other concerns.    PCP is Dr Cindie Laroche.    Past Medical History:  Diagnosis Date  . Fibroid (bleeding) (uterine)   . Fibroid, uterine   . H/O metrorrhagia   . IUD (intrauterine device) in place 07/11/2015  . Menorrhagia   . SVT (supraventricular tachycardia) (HCC)    Past Surgical History:  Procedure Laterality Date  . HYSTEROSCOPY W/D&C N/A 09/27/2013   Procedure: DILATATION AND CURETTAGE /HYSTEROSCOPY and insertion of Mirena IUD ;  Surgeon: [name]. Harrington Challenger, MD;  Location: [facility];  Service: Gynecology;  Laterality: N/A;  . MYOMECTOMY  02/03/2011   Procedure: MYOMECTOMY;  Surgeon: [name], MD;  Location: [facility];  Service: Gynecology;  Laterality: N/A;  . TONSILLECTOMY     The following portions of the patient's history were reviewed and updated as appropriate: allergies, current medications, past family history, past medical history, past social history, past surgical history and problem list.    Health Maintenance:  Normal pap 06/21/13.  Review of Systems:  Pertinent items noted in HPI and remainder of comprehensive ROS otherwise negative.  Physical Exam:   General:  Alert, oriented and cooperative.   Mental Status: Normal mood and affect perceived. Normal judgment and thought content.  Physical exam deferred due to nature of the encounter Ht 5\' 2"  (1.575 m)   LMP  (LMP Unknown)   BMI 52.68 kg/m per pt  Fall risk is low. Will check QCHG and will get angie to check insurance on IUD removal and reinsertion and she needs a pap.   Labs and Imaging No results found for this or any previous visit (from the past 336 hour(s)). No results found.    Assessment and Plan:     1. Missed periods She had 1 + and 3 negative HPTs - Beta hCG quant (ref lab)  2. IUD (intrauterine device) in place Was placed May 2014 - Beta hCG quant (ref lab)       I discussed the assessment and treatment plan with the patient. The patient was provided an opportunity to ask questions and all were answered. The patient agreed with the plan and demonstrated an understanding of the instructions.   The patient was advised to call back or seek an in-person evaluation/go to the ED if the symptoms worsen or if the condition fails to improve as anticipated.  I provided 7 minutes of non-face-to-face time during this encounter.   Derrek Monaco, NP [facility] [employer], [facility]

## 2018-09-20 ENCOUNTER — Telehealth: Payer: Self-pay | Admitting: Adult Health

## 2018-09-20 LAB — BETA HCG QUANT (REF LAB): hCG Quant: <1 m[IU]/mL

## 2018-09-20 NOTE — Telephone Encounter (Signed)
Pt aware QHCG <1, and she still needs to bring insurance card by office

## 2018-10-04 ENCOUNTER — Emergency Department (HOSPITAL_COMMUNITY): Payer: No Typology Code available for payment source

## 2018-10-04 ENCOUNTER — Other Ambulatory Visit: Payer: Self-pay

## 2018-10-04 ENCOUNTER — Telehealth: Payer: Self-pay | Admitting: *Deleted

## 2018-10-04 ENCOUNTER — Inpatient Hospital Stay (HOSPITAL_COMMUNITY)
Admission: EM | Admit: 2018-10-04 | Discharge: 2018-10-06 | DRG: 309 | Disposition: A | Discharge status: Home / Self Care | Payer: No Typology Code available for payment source | Attending: Internal Medicine | Admitting: Internal Medicine

## 2018-10-04 ENCOUNTER — Encounter (HOSPITAL_COMMUNITY): Payer: Self-pay | Admitting: Emergency Medicine

## 2018-10-04 DIAGNOSIS — Z6841 Body Mass Index (BMI) 40.0 and over, adult: Secondary | ICD-10-CM | POA: Diagnosis not present

## 2018-10-04 DIAGNOSIS — F419 Anxiety disorder, unspecified: Secondary | ICD-10-CM | POA: Diagnosis present

## 2018-10-04 DIAGNOSIS — Z833 Family history of diabetes mellitus: Secondary | ICD-10-CM | POA: Diagnosis not present

## 2018-10-04 DIAGNOSIS — Z8249 Family history of ischemic heart disease and other diseases of the circulatory system: Secondary | ICD-10-CM

## 2018-10-04 DIAGNOSIS — Z79899 Other long term (current) drug therapy: Secondary | ICD-10-CM | POA: Diagnosis not present

## 2018-10-04 DIAGNOSIS — Z91013 Allergy to seafood: Secondary | ICD-10-CM

## 2018-10-04 DIAGNOSIS — Z809 Family history of malignant neoplasm, unspecified: Secondary | ICD-10-CM

## 2018-10-04 DIAGNOSIS — Z91048 Other nonmedicinal substance allergy status: Secondary | ICD-10-CM | POA: Diagnosis not present

## 2018-10-04 DIAGNOSIS — R079 Chest pain, unspecified: Secondary | ICD-10-CM

## 2018-10-04 DIAGNOSIS — Z1159 Encounter for screening for other viral diseases: Secondary | ICD-10-CM | POA: Diagnosis not present

## 2018-10-04 DIAGNOSIS — J45909 Unspecified asthma, uncomplicated: Secondary | ICD-10-CM | POA: Diagnosis present

## 2018-10-04 DIAGNOSIS — Z975 Presence of (intrauterine) contraceptive device: Secondary | ICD-10-CM

## 2018-10-04 DIAGNOSIS — R000 Tachycardia, unspecified: Secondary | ICD-10-CM

## 2018-10-04 DIAGNOSIS — Z791 Long term (current) use of non-steroidal anti-inflammatories (NSAID): Secondary | ICD-10-CM | POA: Diagnosis not present

## 2018-10-04 DIAGNOSIS — I471 Supraventricular tachycardia, unspecified: Secondary | ICD-10-CM

## 2018-10-04 LAB — BASIC METABOLIC PANEL
Anion gap: 12 (ref 5–15)
BUN: 10 mg/dL (ref 6–20)
CO2: 21 mmol/L — ABNORMAL LOW (ref 22–32)
Calcium: 9 mg/dL (ref 8.9–10.3)
Chloride: 105 mmol/L (ref 98–111)
Creatinine, Ser: 0.64 mg/dL (ref 0.44–1.00)
GFR calc Af Amer: >60 mL/min (ref >60)
GFR calc non Af Amer: >60 mL/min (ref >60)
Glucose, Bld: 99 mg/dL (ref 70–99)
Potassium: 4.4 mmol/L (ref 3.5–5.1)
Sodium: 138 mmol/L (ref 135–145)

## 2018-10-04 LAB — TSH: TSH: 2.172 u[IU]/mL (ref 0.350–4.500)

## 2018-10-04 LAB — CBC
HCT: 42.7 % (ref 36.0–46.0)
Hemoglobin: 13.5 g/dL (ref 12.0–15.0)
MCH: 27.3 pg (ref 26.0–34.0)
MCHC: 31.6 g/dL (ref 30.0–36.0)
MCV: 86.4 fL (ref 80.0–100.0)
Platelets: 209 10*3/uL (ref 150–400)
RBC: 4.94 MIL/uL (ref 3.87–5.11)
RDW: 14.5 % (ref 11.5–15.5)
WBC: 5.6 10*3/uL (ref 4.0–10.5)
nRBC: 0 % (ref 0.0–0.2)

## 2018-10-04 LAB — MAGNESIUM: Magnesium: 2.2 mg/dL (ref 1.7–2.4)

## 2018-10-04 LAB — TROPONIN I
Troponin I: <0.03 ng/mL (ref <0.03)
Troponin I: <0.03 ng/mL (ref <0.03)
Troponin I: <0.03 ng/mL (ref <0.03)

## 2018-10-04 LAB — MRSA PCR SCREENING: MRSA by PCR: NEGATIVE

## 2018-10-04 LAB — SARS CORONAVIRUS 2 BY RT PCR (HOSPITAL ORDER, PERFORMED IN ~~LOC~~ HOSPITAL LAB): SARS Coronavirus 2: NEGATIVE

## 2018-10-04 LAB — HCG, QUANTITATIVE, PREGNANCY: hCG, Beta Chain, Quant, S: <1 m[IU]/mL (ref <5)

## 2018-10-04 MED ORDER — ADENOSINE 6 MG/2ML IV SOLN
INTRAVENOUS | Status: AC
  Filled 2018-10-04: qty 2

## 2018-10-04 MED ORDER — ACETAMINOPHEN 325 MG PO TABS: 650.0000 mg | ORAL_TABLET | Freq: Four times a day (QID) | ORAL | Status: DC | PRN

## 2018-10-04 MED ORDER — ACETAMINOPHEN 650 MG RE SUPP: 650.0000 mg | Freq: Four times a day (QID) | RECTAL | Status: DC | PRN

## 2018-10-04 MED ORDER — ADENOSINE 6 MG/2ML IV SOLN
12.0000 mg | Freq: Once | INTRAVENOUS | Status: AC
  Administered 2018-10-04: 12 mg via INTRAVENOUS

## 2018-10-04 MED ORDER — DILTIAZEM HCL 100 MG IV SOLR
5.0000 mg/h | INTRAVENOUS | Status: DC
  Administered 2018-10-04: 15 mg/h via INTRAVENOUS
  Filled 2018-10-04: qty 100

## 2018-10-04 MED ORDER — DILTIAZEM LOAD VIA INFUSION
15.0000 mg | Freq: Once | INTRAVENOUS | Status: AC
  Administered 2018-10-04: 15 mg via INTRAVENOUS

## 2018-10-04 MED ORDER — ONDANSETRON HCL 4 MG/2ML IJ SOLN: 4.0000 mg | Freq: Four times a day (QID) | INTRAMUSCULAR | Status: DC | PRN

## 2018-10-04 MED ORDER — ADENOSINE 6 MG/2ML IV SOLN
6.0000 mg | Freq: Once | INTRAVENOUS | Status: AC
  Administered 2018-10-04: 6 mg via INTRAVENOUS

## 2018-10-04 MED ORDER — SODIUM CHLORIDE 0.9% FLUSH
3.0000 mL | Freq: Two times a day (BID) | INTRAVENOUS | Status: DC
  Administered 2018-10-04 – 2018-10-05 (×3): 3 mL via INTRAVENOUS

## 2018-10-04 MED ORDER — IBUPROFEN 600 MG PO TABS
600.0000 mg | ORAL_TABLET | Freq: Four times a day (QID) | ORAL | Status: DC | PRN
  Administered 2018-10-04: 600 mg via ORAL
  Filled 2018-10-04: qty 2

## 2018-10-04 MED ORDER — ADENOSINE 6 MG/2ML IV SOLN
INTRAVENOUS | Status: AC
  Administered 2018-10-04: 6 mg via INTRAVENOUS
  Filled 2018-10-04: qty 4

## 2018-10-04 MED ORDER — SODIUM CHLORIDE 0.9% FLUSH: 3.0000 mL | INTRAVENOUS | Status: DC | PRN

## 2018-10-04 MED ORDER — ENOXAPARIN SODIUM 40 MG/0.4ML ~~LOC~~ SOLN
40.0000 mg | SUBCUTANEOUS | Status: DC
  Administered 2018-10-05: 40 mg via SUBCUTANEOUS
  Filled 2018-10-04: qty 0.4

## 2018-10-04 MED ORDER — DILTIAZEM HCL 100 MG IV SOLR
INTRAVENOUS | Status: AC
  Filled 2018-10-04: qty 100

## 2018-10-04 MED ORDER — ONDANSETRON HCL 4 MG PO TABS: 4.0000 mg | ORAL_TABLET | Freq: Four times a day (QID) | ORAL | Status: DC | PRN

## 2018-10-04 MED ORDER — SODIUM CHLORIDE 0.9 % IV SOLN: 250.0000 mL | INTRAVENOUS | Status: DC | PRN

## 2018-10-04 MED ORDER — SODIUM CHLORIDE 0.9% FLUSH
3.0000 mL | Freq: Once | INTRAVENOUS | Status: AC
  Administered 2018-10-04: 3 mL via INTRAVENOUS

## 2018-10-04 NOTE — ED Triage Notes (Signed)
Pt c/o of central chest pain, sweating and weakness x 3 days

## 2018-10-04 NOTE — Progress Notes (Addendum)
2 LPM via  was added at 2000. Conversion to Normal Sinus Rhythm at 2031.

## 2018-10-04 NOTE — ED Notes (Signed)
Two different doses of adenosine given and was unseccessful and HR back up to 140-150s and pt complaining of chest tightness.

## 2018-10-04 NOTE — ED Provider Notes (Signed)
[facility] EMERGENCY DEPARTMENT Provider Note   CSN: [id number] Arrival date & time: 10/04/18  1147    History   Chief Complaint Chief Complaint  Patient presents with   Chest Pain    HPI Jessica Sanchez is a 33 y.o. female.     HPI  Pt was seen at 1250. Per pt, c/o gradual onset and worsening of persistent "palpitations" for the past 3 days. Pt states 5 days ago she started to feel intermittent palpitations, noting her HR to be "99." Pt states yesterday she began to have a "fast HR" but did not actually take her HR. Has been associated with generalized weakness/fatigue. Pt states she has hx of SVT, rx cardizem, with LD yesterday. Pt states she has not been back to her Cards MD to have EP study since 06/2018. Denies fevers, no cough, no calf/LE pain or unilateral swelling, no back pain, no abd pain, no N/V/D, no known COVID+ exposure.   Past Medical History:  Diagnosis Date   Fibroid (bleeding) (uterine)    Fibroid, uterine    H/O metrorrhagia    IUD (intrauterine device) in place 07/11/2015   Menorrhagia    SVT (supraventricular tachycardia) [facility])     Patient Active Problem List   Diagnosis Date Noted   IUD (intrauterine device) in place 07/11/2015   Other disorder of menstruation and other abnormal bleeding from female genital tract 10/30/2012    Past Surgical History:  Procedure Laterality Date   HYSTEROSCOPY W/D&C N/A 09/27/2013   Procedure: DILATATION AND CURETTAGE /HYSTEROSCOPY and insertion of Mirena IUD ;  Surgeon: [name]. Harrington Challenger, MD;  Location: [place] [facility];  Service: Gynecology;  Laterality: N/A;   MYOMECTOMY  02/03/2011   Procedure: MYOMECTOMY;  Surgeon: [name], MD;  Location: [facility];  Service: Gynecology;  Laterality: N/A;   TONSILLECTOMY       OB History    Gravida  1   Para      Term      Preterm      AB  1   Living        SAB  1   TAB      Ectopic      Multiple      Live Births               Home Medications     Prior to Admission medications   Medication Sig Start Date End Date Taking? Authorizing Provider  albuterol (PROVENTIL HFA) 108 (90 Base) MCG/ACT inhaler Inhale 1-2 puffs into the lungs every 6 (six) hours as needed for wheezing or shortness of breath.    Yes [provider]  diltiazem (CARDIZEM) 30 MG tablet Take 1 tablet (30 mg total) by mouth 3 (three) times daily. 07/13/18  Yes Herminio Commons, MD  ibuprofen (ADVIL,MOTRIN) 600 MG tablet Take 1 tablet (600 mg total) by mouth every 6 (six) hours as needed. 05/06/18  Yes IdolAlmyra Free, PA-C  levonorgestrel (MIRENA) 20 MCG/24HR IUD 1 each by Intrauterine route once. EVERY 5 YEARS   Yes [provider]  Multiple Vitamins-Calcium (ONE-A-DAY WOMENS PO) Take 1 tablet by mouth daily.   Yes [provider]    Family History Family History  Problem Relation Age of Onset   Cirrhosis Mother    Diabetes Mother    Cancer Maternal Grandfather    Hypertension Sister    Diabetes Sister    Anesthesia problems Neg Hx    Hypotension Neg Hx  Malignant hyperthermia Neg Hx    Pseudochol deficiency Neg Hx     Social History Social History   Tobacco Use   Smoking status: Never Smoker   Smokeless tobacco: Never Used  Substance Use Topics   Alcohol use: Yes    Comment: occasional   Drug use: No     Allergies   Shellfish allergy and Adhesive [tape]   Review of Systems Review of Systems ROS: Statement: All systems negative except as marked or noted in the HPI; Constitutional: Negative for fever and chills. +generalized weakness. ; ; Eyes: Negative for eye pain, redness and discharge. ; ; ENMT: Negative for ear pain, hoarseness, nasal congestion, sinus pressure and sore throat. ; ; Cardiovascular: +CP, palpitations. Negative for diaphoresis, dyspnea and peripheral edema. ; ; Respiratory: Negative for cough, wheezing and stridor. ; ; Gastrointestinal: Negative for nausea, vomiting, diarrhea, abdominal pain,  blood in stool, hematemesis, jaundice and rectal bleeding. . ; ; Genitourinary: Negative for dysuria, flank pain and hematuria. ; ; Musculoskeletal: Negative for back pain and neck pain. Negative for swelling and trauma.; ; Skin: Negative for pruritus, rash, abrasions, blisters, bruising and skin lesion.; ; Neuro: Negative for headache, lightheadedness and neck stiffness. Negative for altered level of consciousness, altered mental status, extremity weakness, paresthesias, involuntary movement, seizure and syncope.       Physical Exam Updated Vital Signs BP (!) 110/92    Pulse (!) 131    Temp 98.7 F (37.1 C) (Oral)    Resp (!) 23    Ht 5\' 2"  (1.575 m)    Wt 122.5 kg    LMP  (LMP Unknown)    SpO2 100%    BMI 49.38 kg/m    Patient Vitals for the past 24 hrs:  BP Temp Temp src Pulse Resp SpO2 Height Weight  10/04/18 1430 102/90 -- -- (!) 136 (!) 25 98 % -- --  10/04/18 1415 115/87 -- -- (!) 135 (!) 23 98 % -- --  10/04/18 1400 (!) 110/92 -- -- (!) 131 (!) 23 100 % -- --  10/04/18 1330 103/81 -- -- (!) 125 17 100 % -- --  10/04/18 1315 -- -- -- (!) 125 18 100 % -- --  10/04/18 1310 110/71 -- -- (!) 135 (!) 21 100 % -- --  10/04/18 1300 120/79 -- -- (!) 153 (!) 29 100 % -- --  10/04/18 1255 -- -- -- (!) 147 20 100 % -- --  10/04/18 1253 129/87 -- -- (!) 145 20 99 % -- --  10/04/18 1250 -- -- -- (!) 149 (!) 21 98 % -- --  10/04/18 1230 (!) 130/101 -- -- (!) 146 (!) 24 97 % -- --  10/04/18 1200 (!) 135/102 -- -- (!) 150 (!) 24 99 % 5\' 2"  (1.575 m) 122.5 kg  10/04/18 1158 (!) 152/88 98.7 F (37.1 C) Oral (!) 149 16 99 % -- --    Physical Exam 1255: Physical examination:  Nursing notes reviewed; Vital signs and O2 SAT reviewed;  Constitutional: Well developed, Well nourished, Well hydrated, In no acute distress; Head:  Normocephalic, atraumatic; Eyes: EOMI, PERRL, No scleral icterus; ENMT: Mouth and pharynx normal, Mucous membranes moist; Neck: Supple, Full range of motion, No lymphadenopathy;  Cardiovascular: Tachycardic regular rate and rhythm, No gallop; Respiratory: Breath sounds clear & equal bilaterally, No wheezes.  Speaking full sentences with ease, Normal respiratory effort/excursion; Chest: Nontender, Movement normal; Abdomen: Soft, Nontender, Nondistended, Normal bowel sounds; Genitourinary: No CVA tenderness; Extremities: Peripheral pulses  normal, No tenderness, No edema, No calf edema or asymmetry.; Neuro: AA&Ox3, Major CN grossly intact.  Speech clear. No gross focal motor or sensory deficits in extremities.; Skin: Color normal, Warm, Dry.   ED Treatments / Results  Labs (all labs ordered are listed, but only abnormal results are displayed)   EKG EKG Interpretation  Date/Time:  Wednesday October 04 2018 11:57:56 EDT Ventricular Rate:  150 PR Interval:    QRS Duration: 81 QT Interval:  291 QTC Calculation: 460 R Axis:   54 Text Interpretation:  Supraventricular tachycardia Low voltage, precordial leads Borderline T abnormalities, anterior leads When compared with ECG of 06/05/2018 No significant change was found Confirmed by Francine Graven [phone]) on 10/04/2018 1:19:20 PM    EKG Interpretation  Date/Time:  Wednesday October 04 2018 14:12:53 EDT Ventricular Rate:  139 PR Interval:    QRS Duration: 80 QT Interval:  296 QTC Calculation: 451 R Axis:   43 Text Interpretation:  Junctional tachycardia Borderline T abnormalities, anterior leads Since last tracing of earlier today No significant change was found Confirmed by Francine Graven [phone]) on 10/04/2018 2:16:30 PM         Radiology   Procedures Procedures (including critical care time)     Medications Ordered in ED Medications  diltiazem (CARDIZEM) 1 mg/mL load via infusion 15 mg (15 mg Intravenous Bolus from Bag 10/04/18 1308)    And  diltiazem (CARDIZEM) 100 mg in dextrose 5 % 100 mL (1 mg/mL) infusion (7.5 mg/hr Intravenous Rate/Dose Change 10/04/18 1413)  sodium chloride flush (NS) 0.9 % injection 3  mL (3 mLs Intravenous Given 10/04/18 1250)  adenosine (ADENOCARD) 6 MG/2ML injection 6 mg (6 mg Intravenous Given 10/04/18 1256)  adenosine (ADENOCARD) 6 MG/2ML injection 12 mg (12 mg Intravenous Given 10/04/18 1259)     Initial Impression / Assessment and Plan / ED Course  I have reviewed the triage vital signs and the nursing notes.  Pertinent labs & imaging results that were available during my care of the patient were reviewed by me and considered in my medical decision making (see chart for details).    MDM Reviewed: previous chart, nursing note and vitals Reviewed previous: labs and ECG Interpretation: labs, ECG and x-ray Total time providing critical care: 30-74 minutes. This excludes time spent performing separately reportable procedures and services. Consults: cardiology   CRITICAL CARE Performed by: Francine Graven Total critical care time: 45 minutes Critical care time was exclusive of separately billable procedures and treating other patients. Critical care was necessary to treat or prevent imminent or life-threatening deterioration. Critical care was time spent personally by me on the following activities: development of treatment plan with patient and/or surrogate as well as nursing, discussions with consultants, evaluation of patient's response to treatment, examination of patient, obtaining history from patient or surrogate, ordering and performing treatments and interventions, ordering and review of laboratory studies, ordering and review of radiographic studies, pulse oximetry and re-evaluation of patient's condition.   Results for orders placed or performed during the hospital encounter of 61/44/31  Basic metabolic panel  Result Value Ref Range   Sodium 138 135 - 145 mmol/L   Potassium 4.4 3.5 - 5.1 mmol/L   Chloride 105 98 - 111 mmol/L   CO2 21 (L) 22 - 32 mmol/L   Glucose, Bld 99 70 - 99 mg/dL   BUN 10 6 - 20 mg/dL   Creatinine, Ser 0.64 0.44 - 1.00 mg/dL    Calcium 9.0 8.9 - 10.3 mg/dL  GFR calc non Af Amer >60 >60 mL/min   GFR calc Af Amer >60 >60 mL/min   Anion gap 12 5 - 15  CBC  Result Value Ref Range   WBC 5.6 4.0 - 10.5 K/uL   RBC 4.94 3.87 - 5.11 MIL/uL   Hemoglobin 13.5 12.0 - 15.0 g/dL   HCT 42.7 36.0 - 46.0 %   MCV 86.4 80.0 - 100.0 fL   MCH 27.3 26.0 - 34.0 pg   MCHC 31.6 30.0 - 36.0 g/dL   RDW 14.5 11.5 - 15.5 %   Platelets 209 150 - 400 K/uL   nRBC 0.0 0.0 - 0.2 %  Troponin I - ONCE - STAT  Result Value Ref Range   Troponin I <0.03 <0.03 ng/mL  hCG, quantitative, pregnancy  Result Value Ref Range   hCG, Beta Chain, Quant, S <1 <5 mIU/mL  Magnesium  Result Value Ref Range   Magnesium 2.2 1.7 - 2.4 mg/dL   Dg Chest Port 1 View Result Date: 10/04/2018 CLINICAL DATA:  Central chest pain with diaphoresis and weakness for 3 days. EXAM: PORTABLE CHEST 1 VIEW COMPARISON:  Radiographs 06/23/2018.  CT 06/05/2018. FINDINGS: 1312 hours. The heart size and mediastinal contours are normal. The lungs are clear. There is no pleural effusion or pneumothorax. No acute osseous findings are identified. Telemetry leads overlie the chest. IMPRESSION: Stable chest.  No active cardiopulmonary process. Electronically Signed   By: Richardean Sale M.D.   On: 10/04/2018 13:31    Jessica Sanchez was evaluated in Emergency Department on 10/04/2018 for the symptoms described in the history of present illness. She was evaluated in the context of the global COVID-19 pandemic, which necessitated consideration that the patient might be at risk for infection with the SARS-CoV-2 virus that causes COVID-19. Institutional protocols and algorithms that pertain to the evaluation of patients at risk for COVID-19 are in a state of rapid change based on information released by regulatory bodies including the CDC and federal and state organizations. These policies and algorithms were followed during the patient's care in the ED.    1300:  SVT on monitor with HR  150-160's. Pt with hx of same. States did not take her cardizem today. IV adenosine 6mg  then 12mg  given with very brief response to NSR, then back to SVT. IV cardizem bolus and gtt ordered.   1420:  HR continues 120-130's and BP will drop when cardizem gtt increased. Pt does not seem in any distress, talking on her phone upon my re-evaluation. T/C returned from [facility] Cards Dr. Harl Bowie, case discussed, including:  HPI, pertinent PM/SHx, VS/PE, dx testing, ED course and treatment:  Agreeable to consult, requests to continue on IV cardizem gtt as it is the medication she takes at home and may just need time to reach therapeutic range.   1430:  T/C returned from [facility] Dr. Manuella Ghazi, case discussed, including:  HPI, pertinent PM/SHx, VS/PE, dx testing, ED course and treatment, as well as d/w Cards MD:  Agreeable to admit.     Final Clinical Impressions(s) / ED Diagnoses   Final diagnoses:  Chest pain    ED Discharge Orders    None       Francine Graven, DO 10/05/18 1811

## 2018-10-04 NOTE — ED Notes (Signed)
ED TO INPATIENT HANDOFF REPORT  ED Nurse Name and Phone #: Tiffeny Minchew  S Name/Age/Gender [name] 33 y.o. female Room/Bed: APA03/APA03  Code Status   Code Status: Full Code  Home/SNF/Other Home Patient oriented to: self, place, time and situation Is this baseline? Yes   Triage Complete: Triage complete  Chief Complaint chest pain  Triage Note Pt c/o of central chest pain, sweating and weakness x 3 days    Allergies Allergies  Allergen Reactions  . Shellfish Allergy Shortness Of Breath, Itching and Swelling    Mouth became swollen, throat started itching, and developed shortness of breath  . Adhesive [Tape] Itching    Depends on the type of medical tape    Level of Care/Admitting Diagnosis ED Disposition    ED Disposition Condition [facility] Area: [facility] [[id number]]  Level of Care: Stepdown [14]  Covid Evaluation: N/A  Diagnosis: Tachyarrhythmia [045409]  Admitting Physician: Rodena Goldmann [[id number]]  Attending Physician: Rodena Goldmann [[id number]]  Estimated length of stay: past midnight tomorrow  Certification:: I certify this patient will need inpatient services for at least 2 midnights  PT Class (Do Not Modify): Inpatient [101]  PT Acc Code (Do Not Modify): Private [1]       B Medical/Surgery History Past Medical History:  Diagnosis Date  . Fibroid (bleeding) (uterine)   . Fibroid, uterine   . H/O metrorrhagia   . IUD (intrauterine device) in place 07/11/2015  . Menorrhagia   . SVT (supraventricular tachycardia) (HCC)    Past Surgical History:  Procedure Laterality Date  . HYSTEROSCOPY W/D&C N/A 09/27/2013   Procedure: DILATATION AND CURETTAGE /HYSTEROSCOPY and insertion of Mirena IUD ;  Surgeon: [name]. Harrington Challenger, MD;  Location: [place] [facility];  Service: Gynecology;  Laterality: N/A;  . MYOMECTOMY  02/03/2011   Procedure: MYOMECTOMY;  Surgeon: [name], MD;  Location: [facility];  Service: Gynecology;  Laterality: N/A;  .  TONSILLECTOMY       A IV Location/Drains/Wounds Patient Lines/Drains/Airways Status   Active Line/Drains/Airways    Name:   Placement date:   Placement time:   Site:   Days:   Peripheral IV 10/04/18 Left Antecubital   10/04/18    1254    Antecubital   less than 1          Intake/Output Last 24 hours No intake or output data in the 24 hours ending 10/04/18 1616  Labs/Imaging Results for orders placed or performed during the hospital encounter of 10/04/18 (from the past 48 hour(s))  Basic metabolic panel     Status: Abnormal   Collection Time: 10/04/18 12:47 PM  Result Value Ref Range   Sodium 138 135 - 145 mmol/L   Potassium 4.4 3.5 - 5.1 mmol/L   Chloride 105 98 - 111 mmol/L   CO2 21 (L) 22 - 32 mmol/L   Glucose, Bld 99 70 - 99 mg/dL   BUN 10 6 - 20 mg/dL   Creatinine, Ser 0.64 0.44 - 1.00 mg/dL   Calcium 9.0 8.9 - 10.3 mg/dL   GFR calc non Af Amer >60 >60 mL/min   GFR calc Af Amer >60 >60 mL/min   Anion gap 12 5 - 15    Comment: Performed at [facility], [address] [place], [place] [address]  CBC     Status: None   Collection Time: 10/04/18 12:47 PM  Result Value Ref Range   WBC 5.6 4.0 - 10.5 K/uL  RBC 4.94 3.87 - 5.11 MIL/uL   Hemoglobin 13.5 12.0 - 15.0 g/dL   HCT 42.7 36.0 - 46.0 %   MCV 86.4 80.0 - 100.0 fL   MCH 27.3 26.0 - 34.0 pg   MCHC 31.6 30.0 - 36.0 g/dL   RDW 14.5 11.5 - 15.5 %   Platelets 209 150 - 400 K/uL   nRBC 0.0 0.0 - 0.2 %    Comment: Performed at [facility], [address] [place], [place] [address]  Troponin I - ONCE - STAT     Status: None   Collection Time: 10/04/18 12:47 PM  Result Value Ref Range   Troponin I <0.03 <0.03 ng/mL    Comment: Performed at [facility], [address], [place], [place] [address]  hCG, quantitative, pregnancy     Status: None   Collection Time: 10/04/18 12:47 PM  Result Value Ref Range   hCG, Beta Chain, Quant, S <1 <5 mIU/mL    Comment:          GEST. AGE      CONC.  (mIU/mL)   <=1 WEEK        5  - 50     2 WEEKS       50 - 500     3 WEEKS       100 - 10,000     4 WEEKS     1,000 - 30,000     5 WEEKS     3,500 - 115,000   6-8 WEEKS     12,000 - 270,000    12 WEEKS     15,000 - 220,000        FEMALE AND NON-PREGNANT FEMALE:     LESS THAN 5 mIU/mL Performed at [facility], [address] [place], [place] [address]   Magnesium     Status: None   Collection Time: 10/04/18  1:32 PM  Result Value Ref Range   Magnesium 2.2 1.7 - 2.4 mg/dL    Comment: Performed at [facility], [address] [place], [place] [address]  SARS Coronavirus 2 (CEPHEID - Performed in [place] hospital lab), Hosp Order     Status: None   Collection Time: 10/04/18  2:32 PM  Result Value Ref Range   SARS Coronavirus 2 NEGATIVE NEGATIVE    Comment: (NOTE) If result is NEGATIVE SARS-CoV-2 target nucleic acids are NOT DETECTED. The SARS-CoV-2 RNA is generally detectable in upper and lower  respiratory specimens during the acute phase of infection. The lowest  concentration of SARS-CoV-2 viral copies this assay can detect is 250  copies / mL. A negative result does not preclude SARS-CoV-2 infection  and should not be used as the sole basis for treatment or other  patient management decisions.  A negative result may occur with  improper specimen collection / handling, submission of specimen other  than nasopharyngeal swab, presence of viral mutation(s) within the  areas targeted by this assay, and inadequate number of viral copies  (<250 copies / mL). A negative result must be combined with clinical  observations, patient history, and epidemiological information. If result is POSITIVE SARS-CoV-2 target nucleic acids are DETECTED. The SARS-CoV-2 RNA is generally detectable in upper and lower  respiratory specimens dur ing the acute phase of infection.  Positive  results are indicative of active infection with SARS-CoV-2.  Clinical  correlation with patient history and other diagnostic information is   necessary to determine patient infection status.  Positive results do  not rule out bacterial infection or co-infection with other viruses. If result is PRESUMPTIVE POSTIVE SARS-CoV-2 nucleic acids MAY BE PRESENT.   A presumptive positive result was obtained on the submitted specimen  and confirmed on repeat testing.  While 2019 novel coronavirus  (SARS-CoV-2) nucleic acids may be present in the submitted sample  additional confirmatory testing may be necessary for epidemiological  and / or clinical management purposes  to differentiate between  SARS-CoV-2 and other Sarbecovirus currently known to infect humans.  If clinically indicated additional testing with an alternate test  methodology [phone]) is advised. The SARS-CoV-2 RNA is generally  detectable in upper and lower respiratory sp ecimens during the acute  phase of infection. The expected result is Negative. Fact Sheet for Patients:  StrictlyIdeas.no Fact Sheet for Healthcare Providers: BankingDealers.co.za This test is not yet approved or cleared by the [place] FDA and has been authorized for detection and/or diagnosis of SARS-CoV-2 by FDA under an Emergency Use Authorization (EUA).  This EUA will remain in effect (meaning this test can be used) for the duration of the COVID-19 declaration under Section 564(b)(1) of the Act, 21 U.S.C. section 360bbb-3(b)(1), unless the authorization is terminated or revoked sooner. Performed at [facility], [address], [place], [place] [address]   Troponin I - Now Then Q6H     Status: None   Collection Time: 10/04/18  3:21 PM  Result Value Ref Range   Troponin I <0.03 <0.03 ng/mL    Comment: Performed at [facility], [address] [place], [place] [address]   Dg Chest Port 1 View  Result Date: 10/04/2018 CLINICAL DATA:  Central chest pain with diaphoresis and weakness for 3 days. EXAM: PORTABLE CHEST 1 VIEW COMPARISON:   Radiographs 06/23/2018.  CT 06/05/2018. FINDINGS: 1312 hours. The heart size and mediastinal contours are normal. The lungs are clear. There is no pleural effusion or pneumothorax. No acute osseous findings are identified. Telemetry leads overlie the chest. IMPRESSION: Stable chest.  No active cardiopulmonary process. Electronically Signed   By: Richardean Sale M.D.   On: 10/04/2018 13:31    Pending Labs Unresulted Labs (From admission, onward)    Start     Ordered   10/11/18 0500  Creatinine, serum  (enoxaparin (LOVENOX)    CrCl >/= 30 ml/min)  Weekly,   R    Comments:  while on enoxaparin therapy    10/04/18 1505   10/05/18 0500  Magnesium  Tomorrow morning,   R     10/04/18 1505   10/05/18 2774  Basic metabolic panel  Tomorrow morning,   R     10/04/18 1505   10/05/18 0500  CBC  Tomorrow morning,   R     10/04/18 1505   10/04/18 1505  Troponin I - Now Then Q6H  Now then every 6 hours,   R     10/04/18 1505   10/04/18 1504  TSH  Once,   R     10/04/18 1505   10/04/18 1503  HIV antibody (Routine Testing)  Once,   R     10/04/18 1505          Vitals/Pain Today's Vitals   10/04/18 1330 10/04/18 1400 10/04/18 1415 10/04/18 1430  BP: 103/81 (!) 110/92 115/87 102/90  Pulse: (!) 125 (!) 131 (!) 135 (!) 136  Resp: 17 (!) 23 (!) 23 (!) 25  Temp:      TempSrc:      SpO2: 100% 100% 98% 98%  Weight:      Height:      PainSc:        Isolation Precautions No active isolations  Medications Medications  diltiazem (CARDIZEM) 1 mg/mL load via infusion 15 mg (15 mg Intravenous Bolus from Bag 10/04/18 1308)    And  diltiazem (CARDIZEM) 100 mg in dextrose 5 % 100 mL (1 mg/mL) infusion (10 mg/hr Intravenous Rate/Dose Change 10/04/18 1553)  ibuprofen (ADVIL) tablet 600 mg (has no administration in time range)  enoxaparin (LOVENOX) injection 40 mg (has no administration in time range)  sodium chloride flush (NS) 0.9 % injection 3 mL (has no administration in time range)  sodium chloride  flush (NS) 0.9 % injection 3 mL (has no administration in time range)  0.9 %  sodium chloride infusion (has no administration in time range)  acetaminophen (TYLENOL) tablet 650 mg (has no administration in time range)    Or  acetaminophen (TYLENOL) suppository 650 mg (has no administration in time range)  ondansetron (ZOFRAN) tablet 4 mg (has no administration in time range)    Or  ondansetron (ZOFRAN) injection 4 mg (has no administration in time range)  sodium chloride flush (NS) 0.9 % injection 3 mL (3 mLs Intravenous Given 10/04/18 1250)  adenosine (ADENOCARD) 6 MG/2ML injection 6 mg (6 mg Intravenous Given 10/04/18 1256)  adenosine (ADENOCARD) 6 MG/2ML injection 12 mg (12 mg Intravenous Given 10/04/18 1259)    Mobility walks Low fall risk   Focused Assessment\    R Recommendations: See Admitting Provider Note  Report given to:   Additional Notes:

## 2018-10-04 NOTE — H&P (Signed)
History and Physical    Jessica Sanchez ZOX:[id number] DOB: 05-27-1985 DOA: 10/04/2018  PCP: Lucia Gaskins, MD   Patient coming from: Home  Chief Complaint: Dyspnea and chest pressure  HPI: Jessica Sanchez is a 33 y.o. female with medical history significant for SVT on Cardizem at home and asthma who presented to the ED with some intermittent chest pressure and shortness of breath along with palpitations and nausea that began Friday of last week.  She states that her symptoms have been more progressive and have not become constant.  She woke up this morning with significantly elevated heart rate and palpitations along with shortness of breath which prompted her to come to the emergency department.  She has been taking her Cardizem 3 times a day as otherwise prescribed and was told to follow-up with EP physician in the near future, however with the coronavirus pandemic could not make it to the office for evaluation.  She denies any cough, fevers, chills, or sick contacts.  She has had some associated weakness.   ED Course: Patient noted to have significant tachycardia in the 140 to 150 bpm range which was confirmed as junctional tachycardia on EKG.  She received 2 doses of adenosine with little help and continues to remain significantly tachycardic.  She was given Cardizem IV bolus and started on drip.  Troponin less than 0.03 and laboratory data otherwise unremarkable.  Chest x-ray stable with no other acute findings.  EDP had spoken with Dr. Harl Bowie of cardiology who recommended that patient remain on Cardizem drip for now which should become therapeutic in the next few hours.  She will need close monitoring in stepdown unit.  Review of Systems: All others reviewed and otherwise negative.  Past Medical History:  Diagnosis Date  . Fibroid (bleeding) (uterine)   . Fibroid, uterine   . H/O metrorrhagia   . IUD (intrauterine device) in place 07/11/2015  . Menorrhagia   . SVT (supraventricular  tachycardia) (HCC)     Past Surgical History:  Procedure Laterality Date  . HYSTEROSCOPY W/D&C N/A 09/27/2013   Procedure: DILATATION AND CURETTAGE /HYSTEROSCOPY and insertion of Mirena IUD ;  Surgeon: [name]. Harrington Challenger, MD;  Location: [facility];  Service: Gynecology;  Laterality: N/A;  . MYOMECTOMY  02/03/2011   Procedure: MYOMECTOMY;  Surgeon: [name], MD;  Location: [facility];  Service: Gynecology;  Laterality: N/A;  . TONSILLECTOMY       reports that she has never smoked. She has never used smokeless tobacco. She reports current alcohol use. She reports that she does not use drugs.  Allergies  Allergen Reactions  . Shellfish Allergy Shortness Of Breath, Itching and Swelling    Mouth became swollen, throat started itching, and developed shortness of breath  . Adhesive [Tape] Itching    Depends on the type of medical tape    Family History  Problem Relation Age of Onset  . Cirrhosis Mother   . Diabetes Mother   . Cancer Maternal Grandfather   . Hypertension Sister   . Diabetes Sister   . Anesthesia problems Neg Hx   . Hypotension Neg Hx   . Malignant hyperthermia Neg Hx   . Pseudochol deficiency Neg Hx     Prior to Admission medications   Medication Sig Start Date End Date Taking? Authorizing Provider  albuterol (PROVENTIL HFA) 108 (90 Base) MCG/ACT inhaler Inhale 1-2 puffs into the lungs every 6 (six) hours as needed for wheezing or shortness of breath.    Yes  [provider]  diltiazem (CARDIZEM) 30 MG tablet Take 1 tablet (30 mg total) by mouth 3 (three) times daily. 07/13/18  Yes Herminio Commons, MD  ibuprofen (ADVIL,MOTRIN) 600 MG tablet Take 1 tablet (600 mg total) by mouth every 6 (six) hours as needed. 05/06/18  Yes IdolAlmyra Free, PA-C  levonorgestrel (MIRENA) 20 MCG/24HR IUD 1 each by Intrauterine route once. EVERY 5 YEARS   Yes [provider]  Multiple Vitamins-Calcium (ONE-A-DAY WOMENS PO) Take 1 tablet by mouth daily.   Yes [provider]     Physical Exam: Vitals:   10/04/18 1330 10/04/18 1400 10/04/18 1415 10/04/18 1430  BP: 103/81 (!) 110/92 115/87 102/90  Pulse: (!) 125 (!) 131 (!) 135 (!) 136  Resp: 17 (!) 23 (!) 23 (!) 25  Temp:      TempSrc:      SpO2: 100% 100% 98% 98%  Weight:      Height:        Constitutional: NAD, calm, comfortable, obese Vitals:   10/04/18 1330 10/04/18 1400 10/04/18 1415 10/04/18 1430  BP: 103/81 (!) 110/92 115/87 102/90  Pulse: (!) 125 (!) 131 (!) 135 (!) 136  Resp: 17 (!) 23 (!) 23 (!) 25  Temp:      TempSrc:      SpO2: 100% 100% 98% 98%  Weight:      Height:       Eyes: lids and conjunctivae normal ENMT: Mucous membranes are moist.  Neck: normal, supple Respiratory: clear to auscultation bilaterally. Normal respiratory effort. No accessory muscle use.  Cardiovascular: Tachycardic, no murmurs. No extremity edema. Abdomen: no tenderness, no distention. Bowel sounds positive.  Musculoskeletal:  No joint deformity upper and lower extremities.   Skin: no rashes, lesions, ulcers.  Psychiatric: Normal judgment and insight. Alert and oriented x 3. Normal mood.   Labs on Admission: I have personally reviewed following labs and imaging studies  CBC: Recent Labs  Lab 10/04/18 1247  WBC 5.6  HGB 13.5  HCT 42.7  MCV 86.4  PLT 366   Basic Metabolic Panel: Recent Labs  Lab 10/04/18 1247 10/04/18 1332  NA 138  --   K 4.4  --   CL 105  --   CO2 21*  --   GLUCOSE 99  --   BUN 10  --   CREATININE 0.64  --   CALCIUM 9.0  --   MG  --  2.2   GFR: Estimated Creatinine Clearance: 124.9 mL/min (by C-G formula based on SCr of 0.64 mg/dL). Liver Function Tests: No results for input(s): AST, ALT, ALKPHOS, BILITOT, PROT, ALBUMIN in the last 168 hours. No results for input(s): LIPASE, AMYLASE in the last 168 hours. No results for input(s): AMMONIA in the last 168 hours. Coagulation Profile: No results for input(s): INR, PROTIME in the last 168 hours. Cardiac Enzymes: Recent  Labs  Lab 10/04/18 1247  TROPONINI <0.03   BNP (last 3 results) No results for input(s): PROBNP in the last 8760 hours. HbA1C: No results for input(s): HGBA1C in the last 72 hours. CBG: No results for input(s): GLUCAP in the last 168 hours. Lipid Profile: No results for input(s): CHOL, HDL, LDLCALC, TRIG, CHOLHDL, LDLDIRECT in the last 72 hours. Thyroid Function Tests: No results for input(s): TSH, T4TOTAL, FREET4, T3FREE, THYROIDAB in the last 72 hours. Anemia Panel: No results for input(s): VITAMINB12, FOLATE, FERRITIN, TIBC, IRON, RETICCTPCT in the last 72 hours. Urine analysis:    Component Value Date/Time   COLORURINE  YELLOW 05/26/2018 2100   APPEARANCEUR CLEAR 05/26/2018 2100   LABSPEC 1.025 05/26/2018 2100   PHURINE 6.0 05/26/2018 2100   GLUCOSEU NEGATIVE 05/26/2018 2100   HGBUR MODERATE (A) 05/26/2018 2100   BILIRUBINUR NEGATIVE 05/26/2018 2100   KETONESUR NEGATIVE 05/26/2018 2100   PROTEINUR NEGATIVE 05/26/2018 2100   UROBILINOGEN 0.2 11/05/2014 2200   NITRITE NEGATIVE 05/26/2018 2100   LEUKOCYTESUR TRACE (A) 05/26/2018 2100    Radiological Exams on Admission: Dg Chest Port 1 View  Result Date: 10/04/2018 CLINICAL DATA:  Central chest pain with diaphoresis and weakness for 3 days. EXAM: PORTABLE CHEST 1 VIEW COMPARISON:  Radiographs 06/23/2018.  CT 06/05/2018. FINDINGS: 1312 hours. The heart size and mediastinal contours are normal. The lungs are clear. There is no pleural effusion or pneumothorax. No acute osseous findings are identified. Telemetry leads overlie the chest. IMPRESSION: Stable chest.  No active cardiopulmonary process. Electronically Signed   By: Richardean Sale M.D.   On: 10/04/2018 13:31    EKG: Independently reviewed.  Junctional tachycardia 139 bpm.  Assessment/Plan Active Problems:   Tachyarrhythmia    Symptomatic tachyarrhythmia with history of prior SVT -Continue close monitoring with telemetry in stepdown unit with IV Cardizem and  titrate up accordingly -Consulted cardiology and appreciate evaluation in a.m. -Continue monitor troponins with repeat EKG in the morning -Prior echocardiogram on 06/2018 with LVEF 60 to 65% -Check TSH, monitor repeat labs in a.m.  History of asthma -Order Xopenex and ipratropium as needed for shortness of breath or wheezing   DVT prophylaxis: Lovenox Code Status: Full Family Communication: None at bedside Disposition Plan: Heart rate control Consults called: Cardiology Admission status: Inpatient, stepdown unit   Jacquese Hackman Darleen Crocker DO [facility] Pager [phone]  If 7PM-7AM, please contact night-coverage [email or web address] Password [email or web address]  10/04/2018, 2:51 PM

## 2018-10-04 NOTE — Telephone Encounter (Signed)
Patient called to report that she woke up this morning with increased SOB, fast heart rate (unable to check), and describes a feeling of something sitting on her chest rated 10/10, tingling in right arm. Advised to go to the ED for an evaluation. Verbalized understanding.

## 2018-10-05 DIAGNOSIS — I471 Supraventricular tachycardia: Principal | ICD-10-CM

## 2018-10-05 LAB — CBC
HCT: 40.5 % (ref 36.0–46.0)
Hemoglobin: 12.7 g/dL (ref 12.0–15.0)
MCH: 27.3 pg (ref 26.0–34.0)
MCHC: 31.4 g/dL (ref 30.0–36.0)
MCV: 87.1 fL (ref 80.0–100.0)
Platelets: 306 10*3/uL (ref 150–400)
RBC: 4.65 MIL/uL (ref 3.87–5.11)
RDW: 14.5 % (ref 11.5–15.5)
WBC: 6.3 10*3/uL (ref 4.0–10.5)
nRBC: 0 % (ref 0.0–0.2)

## 2018-10-05 LAB — BASIC METABOLIC PANEL
Anion gap: 8 (ref 5–15)
BUN: 11 mg/dL (ref 6–20)
CO2: 24 mmol/L (ref 22–32)
Calcium: 8.9 mg/dL (ref 8.9–10.3)
Chloride: 103 mmol/L (ref 98–111)
Creatinine, Ser: 0.74 mg/dL (ref 0.44–1.00)
GFR calc Af Amer: >60 mL/min (ref >60)
GFR calc non Af Amer: >60 mL/min (ref >60)
Glucose, Bld: 97 mg/dL (ref 70–99)
Potassium: 3.8 mmol/L (ref 3.5–5.1)
Sodium: 135 mmol/L (ref 135–145)

## 2018-10-05 LAB — HIV ANTIBODY (ROUTINE TESTING W REFLEX): HIV Screen 4th Generation wRfx: NONREACTIVE

## 2018-10-05 LAB — TROPONIN I: Troponin I: <0.03 ng/mL (ref <0.03)

## 2018-10-05 LAB — MAGNESIUM: Magnesium: 2.2 mg/dL (ref 1.7–2.4)

## 2018-10-05 MED ORDER — ALPRAZOLAM 0.25 MG PO TABS: 0.2500 mg | ORAL_TABLET | Freq: Three times a day (TID) | ORAL | Status: DC | PRN

## 2018-10-05 MED ORDER — DILTIAZEM HCL 30 MG PO TABS
30.0000 mg | ORAL_TABLET | Freq: Three times a day (TID) | ORAL | Status: DC
  Administered 2018-10-05 – 2018-10-06 (×4): 30 mg via ORAL
  Filled 2018-10-05 (×4): qty 1

## 2018-10-05 NOTE — Progress Notes (Signed)
PROGRESS NOTE    Jessica Sanchez  WLN:[id number] DOB: [birth year] DOA: 10/04/2018 PCP: Lucia Gaskins, MD   Brief Narrative:  Per HPI: Jessica Sanchez is a 33 y.o. female with medical history significant for SVT on Cardizem at home and asthma who presented to the ED with some intermittent chest pressure and shortness of breath along with palpitations and nausea that began Friday of last week.  She states that her symptoms have been more progressive and have not become constant.  She woke up this morning with significantly elevated heart rate and palpitations along with shortness of breath which prompted her to come to the emergency department.  She has been taking her Cardizem 3 times a day as otherwise prescribed and was told to follow-up with EP physician in the near future, however with the coronavirus pandemic could not make it to the office for evaluation.  She denies any cough, fevers, chills, or sick contacts.  She has had some associated weakness.  Patient was admitted with symptomatic tachyarrhythmia in the setting of prior history of SVT.  She had received some adenosine in the ED with no resolution of her tachyarrhythmia and was subsequently given Cardizem bolus as well as IV drip.  She converted to sinus rhythm overnight on 6/3.  She is no longer symptomatic, but continues to have some soft blood pressure readings.  She has been converted back to her oral Cardizem dosing as previous by cardiology and she has been set up for follow-up with EP in the outpatient setting.   Assessment & Plan:   Active Problems:   Tachyarrhythmia   Symptomatic tachyarrhythmia with history of prior SVT- converted to sinus rhythm -TSH and repeat labs within normal limits -EKG now with sinus rhythm and no new changes -Troponins within normal limits -Appreciate cardiology recommendations with EP set up outpatient and patient converted back to oral Cardizem -Transfer to telemetry today and observe overnight  with anticipated discharge in a.m. on Cardizem 120 mg CD if blood pressures can tolerate  History of asthma -Order Xopenex and ipratropium as needed for shortness of breath or wheezing  Mild anxiety -Patient ordered Xanax as needed   DVT prophylaxis: Lovenox Code Status: Full Family Communication: None at bedside Disposition Plan: Transfer to telemetry today and anticipate discharge with Cardizem 120 mg CD by a.m.  Appreciate cardiology recommendations.   Consultants:   Cardiology  Procedures:   None  Antimicrobials:   None   Subjective: Patient seen and evaluated today with no new acute complaints or concerns. No acute concerns or events noted overnight.  She had converted to sinus rhythm overnight and is no longer symptomatic.  She has some mild anxiety.  Soft blood pressure readings noted.  Objective: Vitals:   10/05/18 0600 10/05/18 0700 10/05/18 0800 10/05/18 1000  BP: (!) 94/49 (!) 83/61 97/68 111/66  Pulse: 76 67 75 82  Resp: 15 20 15 18   Temp:      TempSrc:      SpO2: 96% 96% 97% 97%  Weight:      Height:        Intake/Output Summary (Last 24 hours) at 10/05/2018 1132 Last data filed at 10/05/2018 1000 Gross per 24 hour  Intake 245.89 ml  Output -  Net 245.89 ml   Filed Weights   10/04/18 1200 10/04/18 1732 10/05/18 0500  Weight: 122.5 kg 134.6 kg 135.2 kg    Examination:  General exam: Appears calm and comfortable, obese Respiratory system: Clear to auscultation. Respiratory effort  normal. Cardiovascular system: S1 & S2 heard, RRR. No JVD, murmurs, rubs, gallops or clicks. No pedal edema. Gastrointestinal system: Abdomen is nondistended, soft and nontender. No organomegaly or masses felt. Normal bowel sounds heard. Central nervous system: Alert and oriented. No focal neurological deficits. Extremities: Symmetric 5 x 5 power. Skin: No rashes, lesions or ulcers Psychiatry: Judgement and insight appear normal. Mood & affect appropriate.     Data  Reviewed: I have personally reviewed following labs and imaging studies  CBC: Recent Labs  Lab 10/04/18 1247 10/05/18 0409  WBC 5.6 6.3  HGB 13.5 12.7  HCT 42.7 40.5  MCV 86.4 87.1  PLT 209 660   Basic Metabolic Panel: Recent Labs  Lab 10/04/18 1247 10/04/18 1332 10/05/18 0409  NA 138  --  135  K 4.4  --  3.8  CL 105  --  103  CO2 21*  --  24  GLUCOSE 99  --  97  BUN 10  --  11  CREATININE 0.64  --  0.74  CALCIUM 9.0  --  8.9  MG  --  2.2 2.2   GFR: Estimated Creatinine Clearance: 132.8 mL/min (by C-G formula based on SCr of 0.74 mg/dL). Liver Function Tests: No results for input(s): AST, ALT, ALKPHOS, BILITOT, PROT, ALBUMIN in the last 168 hours. No results for input(s): LIPASE, AMYLASE in the last 168 hours. No results for input(s): AMMONIA in the last 168 hours. Coagulation Profile: No results for input(s): INR, PROTIME in the last 168 hours. Cardiac Enzymes: Recent Labs  Lab 10/04/18 1247 10/04/18 1521 10/04/18 2216 10/05/18 0409  TROPONINI <0.03 <0.03 <0.03 <0.03   BNP (last 3 results) No results for input(s): PROBNP in the last 8760 hours. HbA1C: No results for input(s): HGBA1C in the last 72 hours. CBG: No results for input(s): GLUCAP in the last 168 hours. Lipid Profile: No results for input(s): CHOL, HDL, LDLCALC, TRIG, CHOLHDL, LDLDIRECT in the last 72 hours. Thyroid Function Tests: Recent Labs    10/04/18 1521  TSH 2.172   Anemia Panel: No results for input(s): VITAMINB12, FOLATE, FERRITIN, TIBC, IRON, RETICCTPCT in the last 72 hours. Sepsis Labs: No results for input(s): PROCALCITON, LATICACIDVEN in the last 168 hours.  Recent Results (from the past 240 hour(s))  SARS Coronavirus 2 (CEPHEID - Performed in [facility] hospital lab), Hosp Order     Status: None   Collection Time: 10/04/18  2:32 PM  Result Value Ref Range Status   SARS Coronavirus 2 NEGATIVE NEGATIVE Final    Comment: (NOTE) If result is NEGATIVE SARS-CoV-2 target  nucleic acids are NOT DETECTED. The SARS-CoV-2 RNA is generally detectable in upper and lower  respiratory specimens during the acute phase of infection. The lowest  concentration of SARS-CoV-2 viral copies this assay can detect is 250  copies / mL. A negative result does not preclude SARS-CoV-2 infection  and should not be used as the sole basis for treatment or other  patient management decisions.  A negative result may occur with  improper specimen collection / handling, submission of specimen other  than nasopharyngeal swab, presence of viral mutation(s) within the  areas targeted by this assay, and inadequate number of viral copies  (<250 copies / mL). A negative result must be combined with clinical  observations, patient history, and epidemiological information. If result is POSITIVE SARS-CoV-2 target nucleic acids are DETECTED. The SARS-CoV-2 RNA is generally detectable in upper and lower  respiratory specimens dur ing the acute phase of infection.  Positive  results are indicative of active infection with SARS-CoV-2.  Clinical  correlation with patient history and other diagnostic information is  necessary to determine patient infection status.  Positive results do  not rule out bacterial infection or co-infection with other viruses. If result is PRESUMPTIVE POSTIVE SARS-CoV-2 nucleic acids MAY BE PRESENT.   A presumptive positive result was obtained on the submitted specimen  and confirmed on repeat testing.  While 2019 novel coronavirus  (SARS-CoV-2) nucleic acids may be present in the submitted sample  additional confirmatory testing may be necessary for epidemiological  and / or clinical management purposes  to differentiate between  SARS-CoV-2 and other Sarbecovirus currently known to infect humans.  If clinically indicated additional testing with an alternate test  methodology [phone]) is advised. The SARS-CoV-2 RNA is generally  detectable in upper and lower  respiratory sp ecimens during the acute  phase of infection. The expected result is Negative. Fact Sheet for Patients:  StrictlyIdeas.no Fact Sheet for Healthcare Providers: BankingDealers.co.za This test is not yet approved or cleared by the [place] FDA and has been authorized for detection and/or diagnosis of SARS-CoV-2 by FDA under an Emergency Use Authorization (EUA).  This EUA will remain in effect (meaning this test can be used) for the duration of the COVID-19 declaration under Section 564(b)(1) of the Act, 21 U.S.C. section 360bbb-3(b)(1), unless the authorization is terminated or revoked sooner. Performed at [facility], [address] [place], [place] [address]   MRSA PCR Screening     Status: None   Collection Time: 10/04/18  5:30 PM  Result Value Ref Range Status   MRSA by PCR NEGATIVE NEGATIVE Final    Comment:        The GeneXpert MRSA Assay (FDA approved for NASAL specimens only), is one component of a comprehensive MRSA colonization surveillance program. It is not intended to diagnose MRSA infection nor to guide or monitor treatment for MRSA infections. Performed at [facility], [address] [place], [place] [address]          Radiology Studies: Dg Chest [facility] (2-Rh) 1 View  Result Date: 10/04/2018 CLINICAL DATA:  Central chest pain with diaphoresis and weakness for 3 days. EXAM: PORTABLE CHEST 1 VIEW COMPARISON:  Radiographs 06/23/2018.  CT 06/05/2018. FINDINGS: 1312 hours. The heart size and mediastinal contours are normal. The lungs are clear. There is no pleural effusion or pneumothorax. No acute osseous findings are identified. Telemetry leads overlie the chest. IMPRESSION: Stable chest.  No active cardiopulmonary process. Electronically Signed   By: Richardean Sale M.D.   On: 10/04/2018 13:31        Scheduled Meds: . diltiazem  30 mg Oral Q8H  . enoxaparin (LOVENOX) injection  40 mg Subcutaneous  Q24H  . sodium chloride flush  3 mL Intravenous Q12H   Continuous Infusions: . sodium chloride       LOS: 1 day    Time spent: 30 minutes    Makaelyn Aponte Darleen Crocker, DO [facility] Pager [phone]  If 7PM-7AM, please contact night-coverage [email or web address] Password [facility] 10/05/2018, 11:32 AM

## 2018-10-05 NOTE — Consult Note (Addendum)
Cardiology Consult    Patient ID: Jessica Sanchez; [id number]; [birth year]   Admit date: 10/04/2018 Date of Consult: 10/05/2018  Primary Care Provider: Lucia Gaskins, MD Primary Cardiologist: Jessica Sable, MD   Patient Profile    Jessica Sanchez is a 33 y.o. female with past medical history of pSVT and uterine fibroids who is being seen today for the evaluation of tachycardia at the request of Jessica Sanchez.   History of Present Illness    Jessica Sanchez was examined by Jessica Sanchez in 06/2018 in regards to follow-up from a recent Emergency Department visit for tachycardia felt to be most consistent with SVT. She did not convert with Lopressor and required IV Cardizem, eventually being discharged on Lopressor 25mg  BID. At the time of her visit, she reported still having occasional palpitations with associated dizziness. It was recommended she undergo an echocardiogram, start Cardizem 30mg  BID, and follow-up with EP to discuss possible ablation. Echocardiogram was performed on 06/12/2018 and showed a preserved EF of 60-65% with no regional WMA and no significant valve abnormalities.     She called the office on 3/11 reporting more frequent palpitations and Cardizem was increased to 30mg  TID. Follow-up with Jessica Sanchez was arranged for 3/26 but she cancelled as she did not want to have an E-visit.   She presented to [facility] ED on 10/04/2018 for evaluation of dyspnea and palpitations for the past 3 days. Denied any associated chest pain. No recent fever or chills. Initial rhythm was felt to be most consistent with recurrent SVT and Adenosine was administered at 6mg  then 12mg  with brief return to NSR but she had recurrent SVT and IV Cardizem was initiated.   Initial labs showed WBC 5.6, Hgb 13.5, platelets 209, Na+ 138, K+ 4.4, and creatinine 0.64. Initial and cyclic troponin values have been negative. TSH 2.172. Mg 2.2. CXR showing no acute cardiopulmonary abnormalities. COVID testing negative. EKG  showed narrow-complex tachycardia, HR 150, most consistent with SVT.   IV Cardizem was discontinued overnight as she converted back to NSR. Repeat EKG this AM shows NSR, HR 79, with TWI along anterior leads.     Past Medical History:  Diagnosis Date  . Fibroid (bleeding) (uterine)   . Fibroid, uterine   . H/O metrorrhagia   . IUD (intrauterine device) in place 07/11/2015  . Menorrhagia   . SVT (supraventricular tachycardia) (HCC)     Past Surgical History:  Procedure Laterality Date  . HYSTEROSCOPY W/D&C N/A 09/27/2013   Procedure: DILATATION AND CURETTAGE /HYSTEROSCOPY and insertion of Mirena IUD ;  Surgeon: [name]. Harrington Challenger, MD;  Location: [facility];  Service: Gynecology;  Laterality: N/A;  . MYOMECTOMY  02/03/2011   Procedure: MYOMECTOMY;  Surgeon: [name], MD;  Location: [facility];  Service: Gynecology;  Laterality: N/A;  . TONSILLECTOMY       Home Medications:  Prior to Admission medications   Medication Sig Start Date End Date Taking? Authorizing Provider  albuterol (PROVENTIL HFA) 108 (90 Base) MCG/ACT inhaler Inhale 1-2 puffs into the lungs every 6 (six) hours as needed for wheezing or shortness of breath.    Yes [provider]  diltiazem (CARDIZEM) 30 MG tablet Take 1 tablet (30 mg total) by mouth 3 (three) times daily. 07/13/18  Yes Jessica Commons, MD  ibuprofen (ADVIL,MOTRIN) 600 MG tablet Take 1 tablet (600 mg total) by mouth every 6 (six) hours as needed. 05/06/18  Yes Jessica Free, PA-C  levonorgestrel (MIRENA) 20 MCG/24HR IUD 1  each by Intrauterine route once. EVERY 5 YEARS   Yes [provider]  Multiple Vitamins-Calcium (ONE-A-DAY WOMENS PO) Take 1 tablet by mouth daily.   Yes [provider]    Inpatient Medications: Scheduled Meds: . enoxaparin (LOVENOX) injection  40 mg Subcutaneous Q24H  . sodium chloride flush  3 mL Intravenous Q12H   Continuous Infusions: . sodium chloride    . diltiazem (CARDIZEM) infusion Stopped (10/05/18  0755)   PRN Meds: sodium chloride, acetaminophen **OR** acetaminophen, ibuprofen, ondansetron **OR** ondansetron (ZOFRAN) IV, sodium chloride flush  Allergies:    Allergies  Allergen Reactions  . Shellfish Allergy Shortness Of Breath, Itching and Swelling    Mouth became swollen, throat started itching, and developed shortness of breath  . Adhesive [Tape] Itching    Depends on the type of medical tape    Social History:   Social History   Socioeconomic History  . Marital status: Single    Spouse name: Not on file  . Number of children: Not on file  . Years of education: Not on file  . Highest education level: Not on file  Occupational History  . Not on file  Social Needs  . Financial resource strain: Not on file  . Food insecurity:    Worry: Not on file    Inability: Not on file  . Transportation needs:    Medical: Not on file    Non-medical: Not on file  Tobacco Use  . Smoking status: Never Smoker  . Smokeless tobacco: Never Used  Substance and Sexual Activity  . Alcohol use: Yes    Comment: occasional  . Drug use: No  . Sexual activity: Yes    Birth control/protection: I.U.D.  Lifestyle  . Physical activity:    Days per week: Not on file    Minutes per session: Not on file  . Stress: Not on file  Relationships  . Social connections:    Talks on phone: Not on file    Gets together: Not on file    Attends religious service: Not on file    Active member of club or organization: Not on file    Attends meetings of clubs or organizations: Not on file    Relationship status: Not on file  . Intimate partner violence:    Fear of current or ex partner: Not on file    Emotionally abused: Not on file    Physically abused: Not on file    Forced sexual activity: Not on file  Other Topics Concern  . Not on file  Social History Narrative  . Not on file     Family History:    Family History  Problem Relation Age of Onset  . Cirrhosis Mother   . Diabetes Mother    . Cancer Maternal Grandfather   . Hypertension Sister   . Diabetes Sister   . Anesthesia problems Neg Hx   . Hypotension Neg Hx   . Malignant hyperthermia Neg Hx   . Pseudochol deficiency Neg Hx       Review of Systems    General:  No chills, fever, night sweats or weight changes.  Cardiovascular:  No chest pain, edema, orthopnea,  paroxysmal nocturnal dyspnea. Positive for dyspnea on exertion and palpitations.  Dermatological: No rash, lesions/masses Respiratory: No cough, dyspnea Urologic: No hematuria, dysuria Abdominal:   No nausea, vomiting, diarrhea, bright red blood per rectum, melena, or hematemesis Neurologic:  No visual changes, wkns, changes in mental status. All other systems reviewed  and are otherwise negative except as noted above.  Physical Exam/Data    Vitals:   10/05/18 0400 10/05/18 0500 10/05/18 0600 10/05/18 0700  BP: (!) 79/26 (!) 90/49 (!) 94/49 (!) 83/61  Pulse: 79 78 76 67  Resp: 18 20 15 20   Temp: 98.2 F (36.8 C)     TempSrc: Oral     SpO2: 95% 94% 96% 96%  Weight:  135.2 kg    Height:        Intake/Output Summary (Last 24 hours) at 10/05/2018 0756 Last data filed at 10/05/2018 0600 Gross per 24 hour  Intake 236.29 ml  Output -  Net 236.29 ml   Filed Weights   10/04/18 1200 10/04/18 1732 10/05/18 0500  Weight: 122.5 kg 134.6 kg 135.2 kg   Body mass index is 54.52 kg/m.   General: Pleasant, obese African American female appearing in NAD Psych: Normal affect. Neuro: Alert and oriented X 3. Moves all extremities spontaneously. HEENT: Normal  Neck: Supple without bruits or JVD. Lungs:  Resp regular and unlabored, CTA without wheezing or rales. Heart: RRR no s3, s4, or murmurs. Abdomen: Soft, non-tender, non-distended, BS + x 4.  Extremities: No clubbing, cyanosis or edema. DP/PT/Radials 2+ and equal bilaterally.   EKG:  The EKG was personally reviewed and demonstrates: Narrow-complex tachycardia, HR 150, most consistent with SVT.     Labs/Studies     Relevant CV Studies:  Echocardiogram: 06/2018 IMPRESSIONS   1. The left ventricle has normal systolic function of 41-93%. The cavity size was normal. There is no increased left ventricular wall thickness. Echo evidence of normal diastolic relaxation.  2. The right ventricle has normal systolic function. The cavity was normal. There is no increase in right ventricular wall thickness.  3. The mitral valve is normal in structure. There is mild thickening. No evidence of mitral valve stenosis.  4. The tricuspid valve is normal in structure.  5. The aortic valve is tricuspid.  6. The aortic root is normal in size and structure.  Laboratory Data:  Chemistry Recent Labs  Lab 10/04/18 1247 10/05/18 0409  NA 138 135  K 4.4 3.8  CL 105 103  CO2 21* 24  GLUCOSE 99 97  BUN 10 11  CREATININE 0.64 0.74  CALCIUM 9.0 8.9  GFRNONAA >60 >60  GFRAA >60 >60  ANIONGAP 12 8    No results for input(s): PROT, ALBUMIN, AST, ALT, ALKPHOS, BILITOT in the last 168 hours. Hematology Recent Labs  Lab 10/04/18 1247 10/05/18 0409  WBC 5.6 6.3  RBC 4.94 4.65  HGB 13.5 12.7  HCT 42.7 40.5  MCV 86.4 87.1  MCH 27.3 27.3  MCHC 31.6 31.4  RDW 14.5 14.5  PLT 209 306   Cardiac Enzymes Recent Labs  Lab 10/04/18 1247 10/04/18 1521 10/04/18 2216 10/05/18 0409  TROPONINI <0.03 <0.03 <0.03 <0.03   No results for input(s): TROPIPOC in the last 168 hours.  BNPNo results for input(s): BNP, PROBNP in the last 168 hours.  DDimer No results for input(s): DDIMER in the last 168 hours.  Radiology/Studies:  Dg Chest Port 1 View  Result Date: 10/04/2018 CLINICAL DATA:  Central chest pain with diaphoresis and weakness for 3 days. EXAM: PORTABLE CHEST 1 VIEW COMPARISON:  Radiographs 06/23/2018.  CT 06/05/2018. FINDINGS: 1312 hours. The heart size and mediastinal contours are normal. The lungs are clear. There is no pleural effusion or pneumothorax. No acute osseous findings are identified.  Telemetry leads overlie the chest. IMPRESSION: Stable chest.  No  active cardiopulmonary process. Electronically Signed   By: Richardean Sale M.D.   On: 10/04/2018 13:31     Assessment & Plan    1. Recurrent SVT - she has at least 3 documented occurrences of SVT and presented with dyspnea and palpitations for the past 3 days, found to be in SVT with HR in the 150's upon arrival. Did not convert with Adenosine or Lopressor, required IV Cardizem drip at time of admission with conversion back to NSR yesterday evening.  - Electrolytes and TSH WNL. Recent echo showed a preserved EF of 60-65% with no regional WMA and no significant valve abnormalities. She does not consume alcohol and has significantly reduced her caffeine consumption. Reports taking Cardizem 30mg  at least BID and most days as TID.  - IV Cardizem has now been discontinued. Given soft BP, will start back PO Cardizem 30mg  Q8H with consideration of transitioning to Cardizem CD prior to discharge for improved compliance. She is now open to having an E-visit with Jessica Sanchez to discuss ablation. Will reach out to EP schedulers to see if this can be arranged following discharge.   For questions or updates, please contact [place] Please consult [email or web address] for contact info under Cardiology/STEMI.  Signed, Erma Heritage, PA-C 10/05/2018, 7:56 AM Pager: [phone]  The patient was seen and examined, and I agree with the history, physical exam, assessment and plan as documented above, with modifications as noted below. I have also personally reviewed all relevant documentation, old records, labs, and both radiographic and cardiovascular studies. I have also independently interpreted old and new ECG's.  Briefly, this is a 33 year old woman with a history of paroxysmal SVT.  She told me she takes her diltiazem 30 mg 3 times daily on a routine basis.  Echocardiogram reviewed above with normal LVEF, 60 to 65%.  I arrange for an EP  consultation but she canceled it.  She presented to the ED yesterday with dyspnea and palpitations which did not abate for 3 days.  She was found to be in SVT and given IV adenosine in the ED.  She converted to sinus rhythm briefly but then went back into SVT.  She was started on IV diltiazem.  Labs reviewed above with no significant abnormalities.  She is back in sinus rhythm.  She told me she is short of breath when walking to the bathroom in the hospital room.  She is also anxious.  We will plan to continue oral diltiazem 30 mg 3 times daily.  If blood pressures allow, this can be consolidated to long-acting diltiazem 120 mg daily at the time of discharge.  An outpatient EP consultation has been arranged for June 23 in [place] with Jessica Sanchez to discuss ablation.  I encouraged her to keep this appointment.   Jessica Sable, MD, [facility]  10/05/2018 9:54 AM

## 2018-10-06 MED ORDER — DILTIAZEM HCL ER COATED BEADS 120 MG PO CP24: 120.0000 mg | ORAL_CAPSULE | Freq: Every day | ORAL | Status: DC

## 2018-10-06 MED ORDER — DILTIAZEM HCL ER COATED BEADS 120 MG PO CP24: 120.0000 mg | ORAL_CAPSULE | Freq: Every day | ORAL | 3 refills | Status: DC

## 2018-10-06 NOTE — Progress Notes (Signed)
    Please refer to full consult note from 10/05/2018. Telemetry reviewed from overnight and this morning. She has maintained NSR with rates in the 80's to 90's. No recurrent SVT noted.   Will plan to transition short-acting Cardizem to Cardizem CD 120mg  daily for improved compliance. She can still use short-acting Cardizem on a PRN basis if she has recurrent palpitations. Follow-up with EP to discuss ablation has been arranged.   [facility] will sign off.   Medication Recommendations: Start Cardizem CD 120mg  daily.  Other recommendations (labs, testing, etc): None Follow up as an outpatient: Has follow-up with Dr. Lovena Le on 10/24/2018.  Signed, Erma Heritage, PA-C 10/06/2018, 7:56 AM Pager: [phone]

## 2018-10-06 NOTE — Progress Notes (Signed)
Iv, tele removed. Discharge instructions reviewed with patient, verbalized understanding. Stable patient transported to private vehicle via wheelchair.

## 2018-10-06 NOTE — Discharge Summary (Addendum)
Physician Discharge Summary  EMARY ZALAR HRC:[id number] DOB: 06-04-1985 DOA: 10/04/2018  PCP: Lucia Gaskins, MD  Admit date: 10/04/2018  Discharge date: 10/06/2018  Admitted From:Home  Disposition:  Home  Recommendations for Outpatient Follow-up:  1. Follow up with PCP in 1-2 weeks 2. Follow-up with Dr. Lovena Le of cardiology for EP and possible ablation on 6/23 as scheduled 3. Continue on Cardizem 120 mg daily as prescribed starting 10/07/2018  Home Health: None  Equipment/Devices: None  Discharge Condition: Stable  CODE STATUS: Full  Diet recommendation: Regular  Brief/Interim Summary: Per HPI: Modelle L Brownis a 33 y.o.femalewith medical history significant forSVT on Cardizem at home and asthma who presented to the ED with some intermittent chest pressure and shortness of breath along with palpitations and nausea that began Friday of last week. She states that her symptoms have been more progressive and have not become constant. She woke up this morning with significantly elevated heart rate and palpitations along with shortness of breath which prompted her to come to the emergency department. She has been taking her Cardizem 3 times a day as otherwise prescribed and was told to follow-up with EP physician in the near future, however with the coronavirus pandemic could not make it to the office for evaluation. She denies any cough, fevers, chills, or sick contacts. She has had some associated weakness.  Patient was admitted with symptomatic tachyarrhythmia in the setting of prior history of SVT.  She had received some adenosine in the ED with no resolution of her tachyarrhythmia and was subsequently given Cardizem bolus as well as IV drip.  She converted to sinus rhythm overnight on 6/3.  She is no longer symptomatic, but had some soft blood pressure readings on 6/4.  She was maintained on her usual home dose of Cardizem 60 mg 3 times daily and is now recommended to start on  Cardizem 120 mg CD on a daily basis.  She may still take Cardizem 60 mg 3 times daily as needed for any palpitations or additional symptomatology.  Cardiology has seen the patient and recommended outpatient follow-up with Dr. Lovena Le with EP on 6/23.  Discharge Diagnoses:  Active Problems:   Tachyarrhythmia  Principal discharge diagnosis: Symptomatic tachyarrhythmia in the setting of SVT history-currently in sinus rhythm.  Discharge Instructions  Discharge Instructions    Diet - low sodium heart healthy   Complete by:  As directed    Increase activity slowly   Complete by:  As directed      Allergies as of 10/06/2018      Reactions   Shellfish Allergy Shortness Of Breath, Itching, Swelling   Mouth became swollen, throat started itching, and developed shortness of breath   Adhesive [tape] Itching   Depends on the type of medical tape      Medication List    STOP taking these medications   diltiazem 30 MG tablet Commonly known as:  Cardizem     TAKE these medications   diltiazem 120 MG 24 hr capsule Commonly known as:  CARDIZEM CD Take 1 capsule (120 mg total) by mouth daily. Start taking on:  October 07, 2018   ibuprofen 600 MG tablet Commonly known as:  ADVIL Take 1 tablet (600 mg total) by mouth every 6 (six) hours as needed.   levonorgestrel 20 MCG/24HR IUD Commonly known as:  MIRENA 1 each by Intrauterine route once. EVERY 5 YEARS   ONE-A-DAY WOMENS PO Take 1 tablet by mouth daily.   Proventil HFA 108 (90 Base)  MCG/ACT inhaler Generic drug:  albuterol Inhale 1-2 puffs into the lungs every 6 (six) hours as needed for wheezing or shortness of breath.      Follow-up Information    [name], MD Follow up.   Specialty:  Cardiology Why:  Cardiology appointment with Electrophysiology on 10/24/2018 at 3:30PM. Will be an IN OFFICE visit.  Contact information: [address] [place] [address] [phone]        Lucia Gaskins, MD  Follow up in 1 week(s).   Specialty:  Internal Medicine Contact information: [place] [address] [phone]        Herminio Commons, MD .   Specialty:  Cardiology Contact information: [place] [address] [phone]          Allergies  Allergen Reactions  . Shellfish Allergy Shortness Of Breath, Itching and Swelling    Mouth became swollen, throat started itching, and developed shortness of breath  . Adhesive [Tape] Itching    Depends on the type of medical tape    Consultations:  Cardiology   Procedures/Studies: Dg Chest Port 1 View  Result Date: 10/04/2018 CLINICAL DATA:  Central chest pain with diaphoresis and weakness for 3 days. EXAM: PORTABLE CHEST 1 VIEW COMPARISON:  Radiographs 06/23/2018.  CT 06/05/2018. FINDINGS: 1312 hours. The heart size and mediastinal contours are normal. The lungs are clear. There is no pleural effusion or pneumothorax. No acute osseous findings are identified. Telemetry leads overlie the chest. IMPRESSION: Stable chest.  No active cardiopulmonary process. Electronically Signed   By: Richardean Sale M.D.   On: 10/04/2018 13:31     Discharge Exam: Vitals:   10/05/18 2210 10/06/18 0600  BP: 123/88 129/85  Pulse: 91 90  Resp: 20 20  Temp: 98.4 F (36.9 C) 97.7 F (36.5 C)  SpO2: 99% 100%   Vitals:   10/05/18 1756 10/05/18 2010 10/05/18 2210 10/06/18 0600  BP: 132/78  123/88 129/85  Pulse: 83  91 90  Resp: 17  20 20   Temp: 97.7 F (36.5 C)  98.4 F (36.9 C) 97.7 F (36.5 C)  TempSrc: Oral  Oral Oral  SpO2: 100% 97% 99% 100%  Weight:      Height:        General: Pt is alert, awake, not in acute distress Cardiovascular: RRR, S1/S2 +, no rubs, no gallops Respiratory: CTA bilaterally, no wheezing, no rhonchi Abdominal: Soft, NT, ND, bowel sounds + Extremities: no edema, no cyanosis    The results of significant diagnostics from this hospitalization (including imaging,  microbiology, ancillary and laboratory) are listed below for reference.     Microbiology: Recent Results (from the past 240 hour(s))  SARS Coronavirus 2 (CEPHEID - Performed in [place] hospital lab), Hosp Order     Status: None   Collection Time: 10/04/18  2:32 PM  Result Value Ref Range Status   SARS Coronavirus 2 NEGATIVE NEGATIVE Final    Comment: (NOTE) If result is NEGATIVE SARS-CoV-2 target nucleic acids are NOT DETECTED. The SARS-CoV-2 RNA is generally detectable in upper and lower  respiratory specimens during the acute phase of infection. The lowest  concentration of SARS-CoV-2 viral copies this assay can detect is 250  copies / mL. A negative result does not preclude SARS-CoV-2 infection  and should not be used as the sole basis for treatment or other  patient management decisions.  A negative result may occur with  improper specimen collection / handling,  submission of specimen other  than nasopharyngeal swab, presence of viral mutation(s) within the  areas targeted by this assay, and inadequate number of viral copies  (<250 copies / mL). A negative result must be combined with clinical  observations, patient history, and epidemiological information. If result is POSITIVE SARS-CoV-2 target nucleic acids are DETECTED. The SARS-CoV-2 RNA is generally detectable in upper and lower  respiratory specimens dur ing the acute phase of infection.  Positive  results are indicative of active infection with SARS-CoV-2.  Clinical  correlation with patient history and other diagnostic information is  necessary to determine patient infection status.  Positive results do  not rule out bacterial infection or co-infection with other viruses. If result is PRESUMPTIVE POSTIVE SARS-CoV-2 nucleic acids MAY BE PRESENT.   A presumptive positive result was obtained on the submitted specimen  and confirmed on repeat testing.  While 2019 novel coronavirus  (SARS-CoV-2) nucleic acids may be  present in the submitted sample  additional confirmatory testing may be necessary for epidemiological  and / or clinical management purposes  to differentiate between  SARS-CoV-2 and other Sarbecovirus currently known to infect humans.  If clinically indicated additional testing with an alternate test  methodology [phone]) is advised. The SARS-CoV-2 RNA is generally  detectable in upper and lower respiratory sp ecimens during the acute  phase of infection. The expected result is Negative. Fact Sheet for Patients:  StrictlyIdeas.no Fact Sheet for Healthcare Providers: BankingDealers.co.za This test is not yet approved or cleared by the [place] FDA and has been authorized for detection and/or diagnosis of SARS-CoV-2 by FDA under an Emergency Use Authorization (EUA).  This EUA will remain in effect (meaning this test can be used) for the duration of the COVID-19 declaration under Section 564(b)(1) of the Act, 21 U.S.C. section 360bbb-3(b)(1), unless the authorization is terminated or revoked sooner. Performed at [facility], [address] [place], [place] [address]   MRSA PCR Screening     Status: None   Collection Time: 10/04/18  5:30 PM  Result Value Ref Range Status   MRSA by PCR NEGATIVE NEGATIVE Final    Comment:        The GeneXpert MRSA Assay (FDA approved for NASAL specimens only), is one component of a comprehensive MRSA colonization surveillance program. It is not intended to diagnose MRSA infection nor to guide or monitor treatment for MRSA infections. Performed at [facility], [address], [place], [place] [address]      Labs: BNP (last 3 results) No results for input(s): BNP in the last 8760 hours. Basic Metabolic Panel: Recent Labs  Lab 10/04/18 1247 10/04/18 1332 10/05/18 0409  NA 138  --  135  K 4.4  --  3.8  CL 105  --  103  CO2 21*  --  24  GLUCOSE 99  --  97  BUN 10  --  11  CREATININE  0.64  --  0.74  CALCIUM 9.0  --  8.9  MG  --  2.2 2.2   Liver Function Tests: No results for input(s): AST, ALT, ALKPHOS, BILITOT, PROT, ALBUMIN in the last 168 hours. No results for input(s): LIPASE, AMYLASE in the last 168 hours. No results for input(s): AMMONIA in the last 168 hours. CBC: Recent Labs  Lab 10/04/18 1247 10/05/18 0409  WBC 5.6 6.3  HGB 13.5 12.7  HCT 42.7 40.5  MCV 86.4 87.1  PLT 209 306   Cardiac Enzymes: Recent Labs  Lab 10/04/18 1247 10/04/18  1521 10/04/18 2216 10/05/18 0409  TROPONINI <0.03 <0.03 <0.03 <0.03   BNP: Invalid input(s): POCBNP CBG: No results for input(s): GLUCAP in the last 168 hours. D-Dimer No results for input(s): DDIMER in the last 72 hours. Hgb A1c No results for input(s): HGBA1C in the last 72 hours. Lipid Profile No results for input(s): CHOL, HDL, LDLCALC, TRIG, CHOLHDL, LDLDIRECT in the last 72 hours. Thyroid function studies Recent Labs    10/04/18 1521  TSH 2.172   Anemia work up No results for input(s): VITAMINB12, FOLATE, FERRITIN, TIBC, IRON, RETICCTPCT in the last 72 hours. Urinalysis    Component Value Date/Time   COLORURINE YELLOW 05/26/2018 2100   APPEARANCEUR CLEAR 05/26/2018 2100   LABSPEC 1.025 05/26/2018 2100   PHURINE 6.0 05/26/2018 2100   GLUCOSEU NEGATIVE 05/26/2018 2100   HGBUR MODERATE (A) 05/26/2018 2100   BILIRUBINUR NEGATIVE 05/26/2018 2100   KETONESUR NEGATIVE 05/26/2018 2100   PROTEINUR NEGATIVE 05/26/2018 2100   UROBILINOGEN 0.2 11/05/2014 2200   NITRITE NEGATIVE 05/26/2018 2100   LEUKOCYTESUR TRACE (A) 05/26/2018 2100   Sepsis Labs Invalid input(s): PROCALCITONIN,  WBC,  LACTICIDVEN Microbiology Recent Results (from the past 240 hour(s))  SARS Coronavirus 2 (CEPHEID - Performed in [place] hospital lab), Hosp Order     Status: None   Collection Time: 10/04/18  2:32 PM  Result Value Ref Range Status   SARS Coronavirus 2 NEGATIVE NEGATIVE Final    Comment: (NOTE) If result is  NEGATIVE SARS-CoV-2 target nucleic acids are NOT DETECTED. The SARS-CoV-2 RNA is generally detectable in upper and lower  respiratory specimens during the acute phase of infection. The lowest  concentration of SARS-CoV-2 viral copies this assay can detect is 250  copies / mL. A negative result does not preclude SARS-CoV-2 infection  and should not be used as the sole basis for treatment or other  patient management decisions.  A negative result may occur with  improper specimen collection / handling, submission of specimen other  than nasopharyngeal swab, presence of viral mutation(s) within the  areas targeted by this assay, and inadequate number of viral copies  (<250 copies / mL). A negative result must be combined with clinical  observations, patient history, and epidemiological information. If result is POSITIVE SARS-CoV-2 target nucleic acids are DETECTED. The SARS-CoV-2 RNA is generally detectable in upper and lower  respiratory specimens dur ing the acute phase of infection.  Positive  results are indicative of active infection with SARS-CoV-2.  Clinical  correlation with patient history and other diagnostic information is  necessary to determine patient infection status.  Positive results do  not rule out bacterial infection or co-infection with other viruses. If result is PRESUMPTIVE POSTIVE SARS-CoV-2 nucleic acids MAY BE PRESENT.   A presumptive positive result was obtained on the submitted specimen  and confirmed on repeat testing.  While 2019 novel coronavirus  (SARS-CoV-2) nucleic acids may be present in the submitted sample  additional confirmatory testing may be necessary for epidemiological  and / or clinical management purposes  to differentiate between  SARS-CoV-2 and other Sarbecovirus currently known to infect humans.  If clinically indicated additional testing with an alternate test  methodology [phone]) is advised. The SARS-CoV-2 RNA is generally  detectable  in upper and lower respiratory sp ecimens during the acute  phase of infection. The expected result is Negative. Fact Sheet for Patients:  StrictlyIdeas.no Fact Sheet for Healthcare Providers: BankingDealers.co.za This test is not yet approved or cleared by the [place] FDA and  has been authorized for detection and/or diagnosis of SARS-CoV-2 by FDA under an Emergency Use Authorization (EUA).  This EUA will remain in effect (meaning this test can be used) for the duration of the COVID-19 declaration under Section 564(b)(1) of the Act, 21 U.S.C. section 360bbb-3(b)(1), unless the authorization is terminated or revoked sooner. Performed at [facility], [address], [place], [place] [address]   MRSA PCR Screening     Status: None   Collection Time: 10/04/18  5:30 PM  Result Value Ref Range Status   MRSA by PCR NEGATIVE NEGATIVE Final    Comment:        The GeneXpert MRSA Assay (FDA approved for NASAL specimens only), is one component of a comprehensive MRSA colonization surveillance program. It is not intended to diagnose MRSA infection nor to guide or monitor treatment for MRSA infections. Performed at [facility], [address], [place], [place] [address]      Time coordinating discharge: 35 minutes  SIGNED:   Rodena Goldmann, DO [facility] 10/06/2018, 8:41 AM  If 7PM-7AM, please contact night-coverage [email or web address] Password [email or web address]

## 2018-10-09 ENCOUNTER — Emergency Department (HOSPITAL_COMMUNITY): Payer: No Typology Code available for payment source

## 2018-10-09 ENCOUNTER — Other Ambulatory Visit: Payer: Self-pay

## 2018-10-09 ENCOUNTER — Emergency Department (HOSPITAL_COMMUNITY)
Admission: EM | Admit: 2018-10-09 | Discharge: 2018-10-09 | Disposition: A | Discharge status: Home / Self Care | Payer: No Typology Code available for payment source | Attending: Emergency Medicine | Admitting: Emergency Medicine

## 2018-10-09 ENCOUNTER — Encounter (HOSPITAL_COMMUNITY): Payer: Self-pay | Admitting: Emergency Medicine

## 2018-10-09 ENCOUNTER — Telehealth: Payer: Self-pay | Admitting: Cardiovascular Disease

## 2018-10-09 DIAGNOSIS — Z79899 Other long term (current) drug therapy: Secondary | ICD-10-CM | POA: Insufficient documentation

## 2018-10-09 DIAGNOSIS — R002 Palpitations: Secondary | ICD-10-CM | POA: Diagnosis not present

## 2018-10-09 LAB — BASIC METABOLIC PANEL
Anion gap: 14 (ref 5–15)
BUN: 13 mg/dL (ref 6–20)
CO2: 21 mmol/L — ABNORMAL LOW (ref 22–32)
Calcium: 9 mg/dL (ref 8.9–10.3)
Chloride: 102 mmol/L (ref 98–111)
Creatinine, Ser: 0.71 mg/dL (ref 0.44–1.00)
GFR calc Af Amer: >60 mL/min (ref >60)
GFR calc non Af Amer: >60 mL/min (ref >60)
Glucose, Bld: 76 mg/dL (ref 70–99)
Potassium: 3.9 mmol/L (ref 3.5–5.1)
Sodium: 137 mmol/L (ref 135–145)

## 2018-10-09 LAB — CBC
HCT: 40.5 % (ref 36.0–46.0)
Hemoglobin: 12.7 g/dL (ref 12.0–15.0)
MCH: 27.4 pg (ref 26.0–34.0)
MCHC: 31.4 g/dL (ref 30.0–36.0)
MCV: 87.3 fL (ref 80.0–100.0)
Platelets: 306 10*3/uL (ref 150–400)
RBC: 4.64 MIL/uL (ref 3.87–5.11)
RDW: 14.1 % (ref 11.5–15.5)
WBC: 5 10*3/uL (ref 4.0–10.5)
nRBC: 0 % (ref 0.0–0.2)

## 2018-10-09 LAB — TROPONIN I: Troponin I: <0.03 ng/mL (ref <0.03)

## 2018-10-09 LAB — MAGNESIUM: Magnesium: 2.1 mg/dL (ref 1.7–2.4)

## 2018-10-09 LAB — POCT PREGNANCY, URINE: Preg Test, Ur: NEGATIVE

## 2018-10-09 MED ORDER — SODIUM CHLORIDE 0.9% FLUSH: 3.0000 mL | Freq: Once | INTRAVENOUS | Status: DC

## 2018-10-09 MED ORDER — DILTIAZEM HCL ER COATED BEADS 180 MG PO CP24: 180.0000 mg | ORAL_CAPSULE | Freq: Every day | ORAL | 0 refills | Status: DC

## 2018-10-09 NOTE — Discharge Instructions (Addendum)
Please see the information and instructions below regarding your visit.  Your diagnoses today include:  1. Palpitations     Tests performed today include: See side panel of your discharge paperwork for testing performed today. Vital signs are listed at the bottom of these instructions.   Your work-up is very reassuring today.  We found no obvious cause of your SVT to return.   Your chest x-ray has will be called atelectasis.  This is a collapsing of the small air spaces called alveoli.  This usually occurs when you are not taking deep breaths.  Medications prescribed:    Take any prescribed medications only as prescribed, and any over the counter medications only as directed on the packaging.  Please start taking your NEW Cardizem tablet, 180 mg.   In the meantime, please discontinue use of your 120 mg Cardizem tablet.  Home care instructions:  Please follow any educational materials contained in this packet.   Follow-up instructions: Please follow-up with Dr. Lovena Le on June 23rd.   Return instructions:  Please return to the Emergency Department if you experience worsening symptoms.  Please return to the emergency department if you develop any return of palpitations, chest pain, shortness of breath, passing out or near passing out spells. Please return if you have any other emergent concerns.  Additional Information:   Your vital signs today were: BP 127/67 (BP Location: Right Arm)    Pulse 79    Temp 98.3 F (36.8 C) (Oral)    Resp 18    Ht 5\' 2"  (1.575 m)    Wt 120.2 kg    LMP  (LMP Unknown)    SpO2 96%    BMI 48.47 kg/m  If your blood pressure (BP) was elevated on multiple readings during this visit above 130 for the top number or above 80 for the bottom number, please have this repeated by your primary care provider within one month. --------------  Thank you for allowing [place] to participate in your care today.

## 2018-10-09 NOTE — ED Triage Notes (Signed)
Has felt heart racing since this morning   Has hx of sustained V tach   Here for eval

## 2018-10-09 NOTE — Telephone Encounter (Signed)
Pt was supposed to return to work today, but she's having problems with her heart again beating to fast, she can feel it in her throat.   Please call pt @ [phone]

## 2018-10-09 NOTE — Telephone Encounter (Signed)
Patient states her bo 141/96 states Hr was 104.She states she is already en route to the ED for evaluation.

## 2018-10-09 NOTE — Telephone Encounter (Signed)
Patient wanting to know if she needs to go back to hospital.

## 2018-10-09 NOTE — ED Provider Notes (Signed)
[facility] EMERGENCY DEPARTMENT Provider Note   CSN: [id number] Arrival date & time: 10/09/18  1258    History   Chief Complaint Chief Complaint  Patient presents with  . Palpitations    HPI Jessica Sanchez is a 33 y.o. female.     HPI  Patient is a 33 year old female past medical history of SVT, uterine fibroids and menorrhagia presenting for palpitations.  Patient reports that she was hospitalized last week for sustained SVT for which she was converted with adenosine and followed by Cardizem drip.  Patient is currently taking Cardizem 120 mg 24-hour capsule.  Patient ports she was scheduled to go back to work today when around 9:30 AM she experienced palpitations as well as sensation of shortness of breath with the palpitations.  This lasted for a couple hours.  Patient ports that she measured her heart rate is 104.  Blood pressure was normotensive.  Past Medical History:  Diagnosis Date  . Fibroid (bleeding) (uterine)   . Fibroid, uterine   . H/O metrorrhagia   . IUD (intrauterine device) in place 07/11/2015  . Menorrhagia   . SVT (supraventricular tachycardia) [facility])     Patient Active Problem List   Diagnosis Date Noted  . Sustained SVT ([place]) 10/04/2018  . IUD (intrauterine device) in place 07/11/2015  . Other disorder of menstruation and other abnormal bleeding from female genital tract 10/30/2012    Past Surgical History:  Procedure Laterality Date  . HYSTEROSCOPY W/D&C N/A 09/27/2013   Procedure: DILATATION AND CURETTAGE /HYSTEROSCOPY and insertion of Mirena IUD ;  Surgeon: [name]. Harrington Challenger, MD;  Location: [facility];  Service: Gynecology;  Laterality: N/A;  . MYOMECTOMY  02/03/2011   Procedure: MYOMECTOMY;  Surgeon: [name], MD;  Location: [facility];  Service: Gynecology;  Laterality: N/A;  . TONSILLECTOMY       OB History    Gravida  1   Para      Term      Preterm      AB  1   Living        SAB  1   TAB      Ectopic      Multiple      Live  Births               Home Medications    Prior to Admission medications   Medication Sig Start Date End Date Taking? Authorizing Provider  albuterol (PROVENTIL HFA) 108 (90 Base) MCG/ACT inhaler Inhale 1-2 puffs into the lungs every 6 (six) hours as needed for wheezing or shortness of breath.     [provider]  diltiazem (CARDIZEM CD) 120 MG 24 hr capsule Take 1 capsule (120 mg total) by mouth daily. 10/07/18 02/04/19  Manuella Ghazi, Pratik D, DO  ibuprofen (ADVIL,MOTRIN) 600 MG tablet Take 1 tablet (600 mg total) by mouth every 6 (six) hours as needed. 05/06/18   [name], PA-C  levonorgestrel (MIRENA) 20 MCG/24HR IUD 1 each by Intrauterine route once. EVERY 5 YEARS    [provider]  Multiple Vitamins-Calcium (ONE-A-DAY WOMENS PO) Take 1 tablet by mouth daily.    [provider]    Family History Family History  Problem Relation Age of Onset  . Cirrhosis Mother   . Diabetes Mother   . Cancer Maternal Grandfather   . Hypertension Sister   . Diabetes Sister   . Anesthesia problems Neg Hx   . Hypotension Neg Hx   . Malignant hyperthermia Neg  Hx   . Pseudochol deficiency Neg Hx     Social History Social History   Tobacco Use  . Smoking status: Never Smoker  . Smokeless tobacco: Never Used  Substance Use Topics  . Alcohol use: Yes    Comment: occasional  . Drug use: No     Allergies   Shellfish allergy and Adhesive [tape]   Review of Systems Review of Systems  Constitutional: Negative for chills and fever.  HENT: Negative for congestion and sore throat.   Eyes: Negative for visual disturbance.  Respiratory: Positive for cough and shortness of breath. Negative for chest tightness.   Cardiovascular: Positive for palpitations. Negative for chest pain and leg swelling.  Gastrointestinal: Negative for abdominal pain, nausea and vomiting.  Genitourinary: Negative for dysuria and flank pain.  Musculoskeletal: Negative for back pain and myalgias.   Skin: Negative for rash.  Neurological: Negative for dizziness, syncope, light-headedness and headaches.     Physical Exam Updated Vital Signs BP 127/67 (BP Location: Right Arm)   Pulse 79   Temp 98.3 F (36.8 C) (Oral)   Resp 18   Ht 5\' 2"  (1.575 m)   Wt 120.2 kg   LMP  (LMP Unknown)   SpO2 96%   BMI 48.47 kg/m   Physical Exam Vitals signs and nursing note reviewed.  Constitutional:      General: She is not in acute distress.    Appearance: She is well-developed.  HENT:     Head: Normocephalic and atraumatic.  Eyes:     Conjunctiva/sclera: Conjunctivae normal.     Pupils: Pupils are equal, round, and reactive to light.  Neck:     Musculoskeletal: Normal range of motion and neck supple.  Cardiovascular:     Rate and Rhythm: Normal rate and regular rhythm.     Pulses: Normal pulses.     Heart sounds: Normal heart sounds, S1 normal and S2 normal. No murmur.  Pulmonary:     Effort: Pulmonary effort is normal.     Breath sounds: Normal breath sounds. No wheezing or rales.  Abdominal:     General: There is no distension.     Palpations: Abdomen is soft.     Tenderness: There is no abdominal tenderness. There is no guarding.  Musculoskeletal: Normal range of motion.        General: No deformity.     Right lower leg: No edema.     Left lower leg: No edema.  Lymphadenopathy:     Cervical: No cervical adenopathy.  Skin:    General: Skin is warm and dry.     Findings: No erythema or rash.  Neurological:     Mental Status: She is alert.     Comments: Cranial nerves grossly intact. Patient moves extremities symmetrically and with good coordination.  Psychiatric:        Behavior: Behavior normal.        Thought Content: Thought content normal.        Judgment: Judgment normal.      ED Treatments / Results  Labs (all labs ordered are listed, but only abnormal results are displayed) Labs Reviewed  BASIC METABOLIC PANEL - Abnormal; Notable for the following  components:      Result Value   CO2 21 (*)    All other components within normal limits  CBC  TROPONIN I  MAGNESIUM  POCT PREGNANCY, URINE  POC URINE PREG, ED    EKG EKG Interpretation  Date/Time:  Monday October 09 2018  13:17:55 EDT Ventricular Rate:  82 PR Interval:  148 QRS Duration: 84 QT Interval:  382 QTC Calculation: 446 R Axis:   6 Text Interpretation:  Normal sinus rhythm Cannot rule out Anterior infarct , age undetermined When compared with ECG of 10/05/2018 No significant change was found Confirmed by Francine Graven [phone]) on 10/09/2018 2:39:41 PM   Radiology Dg Chest 2 View  Result Date: 10/09/2018 CLINICAL DATA:  Central chest pain.  Chest pressure. EXAM: CHEST - 2 VIEW COMPARISON:  10/04/2018 FINDINGS: Mild cardiac enlargement. Lung volumes are low. Platelike atelectasis noted in the left base. No pleural effusion or edema. No airspace opacities. IMPRESSION: 1. Left base platelike atelectasis. Electronically Signed   By: Kerby Moors M.D.   On: 10/09/2018 13:56    Procedures Procedures (including critical care time)  Medications Ordered in ED Medications  sodium chloride flush (NS) 0.9 % injection 3 mL (has no administration in time range)     Initial Impression / Assessment and Plan / ED Course  I have reviewed the triage vital signs and the nursing notes.  Pertinent labs & imaging results that were available during my care of the patient were reviewed by me and considered in my medical decision making (see chart for details).  Clinical Course as of Oct 08 1537  Mon Oct 09, 2018  1527 Preg Test, Ur: NEGATIVE [AM]  1527 Spoke with Dr. Domenic Polite who recommends Cardizem 180 mg 24 hour until patient has her EP appointment on the 23rd.    [AM]    Clinical Course User Index [AM] Albesa Seen, PA-C       This is a well-appearing 33 year old female with past medical history of sustained SVT requiring admission for Cardizem drip presenting for  palpitations.  On arrival she is in normal sinus rhythm.  She has had intermittent PVCs on the monitor.  Will obtain CBC, CMP and magnesium to check for any abnormalities precipitating palpitations and discussed with cardiology.  Work-up unremarkable.  Electrolytes are normal.  Patient is not pregnant.  EKG normal sinus rhythm.  Reviewed by me.  Chest x-ray obtained and reviewed by me.  She has low lung volumes.  Likely atelectasis in the left lung base.  With normal labs, nonproductive cough and no fever or chills, low suspicion that this is indicative of pneumonia.  Do not feel the patient requires antibiotics at this time.  CBC without acute abnormalities.  There is no hypotension or tachycardia with orthostatic vital signs.  Doubt  POTS. Per discussion with Dr. Domenic Polite of cardiology, will increase patient's Cardizem to 180 mg 24-hour tablet.  Patient to follow-up with Dr. Lovena Le on June 23 of her previously scheduled appointment.  She is instructed return to the emergency department for any return of palpitations, tachycardia, chest pain or shortness of breath in the interim.  Final Clinical Impressions(s) / ED Diagnoses   Final diagnoses:  Palpitations    ED Discharge Orders         Ordered    diltiazem (CARDIZEM CD) 180 MG 24 hr capsule  Daily     10/09/18 1541           Albesa Seen, PA-C 10/09/18 Herrick, [place], DO 10/13/18 1653

## 2018-10-16 ENCOUNTER — Emergency Department (HOSPITAL_COMMUNITY)
Admission: EM | Admit: 2018-10-16 | Discharge: 2018-10-16 | Disposition: A | Discharge status: Home / Self Care | Payer: No Typology Code available for payment source | Attending: Emergency Medicine | Admitting: Emergency Medicine

## 2018-10-16 ENCOUNTER — Other Ambulatory Visit: Payer: Self-pay

## 2018-10-16 ENCOUNTER — Emergency Department (HOSPITAL_COMMUNITY): Payer: No Typology Code available for payment source

## 2018-10-16 ENCOUNTER — Encounter (HOSPITAL_COMMUNITY): Payer: Self-pay | Admitting: *Deleted

## 2018-10-16 DIAGNOSIS — Z79899 Other long term (current) drug therapy: Secondary | ICD-10-CM | POA: Diagnosis not present

## 2018-10-16 DIAGNOSIS — R079 Chest pain, unspecified: Secondary | ICD-10-CM | POA: Diagnosis not present

## 2018-10-16 LAB — BASIC METABOLIC PANEL
Anion gap: 10 (ref 5–15)
BUN: 12 mg/dL (ref 6–20)
CO2: 22 mmol/L (ref 22–32)
Calcium: 9.2 mg/dL (ref 8.9–10.3)
Chloride: 104 mmol/L (ref 98–111)
Creatinine, Ser: 0.72 mg/dL (ref 0.44–1.00)
GFR calc Af Amer: >60 mL/min (ref >60)
GFR calc non Af Amer: >60 mL/min (ref >60)
Glucose, Bld: 80 mg/dL (ref 70–99)
Potassium: 4.2 mmol/L (ref 3.5–5.1)
Sodium: 136 mmol/L (ref 135–145)

## 2018-10-16 LAB — CBC
HCT: 39.3 % (ref 36.0–46.0)
Hemoglobin: 12.7 g/dL (ref 12.0–15.0)
MCH: 27.7 pg (ref 26.0–34.0)
MCHC: 32.3 g/dL (ref 30.0–36.0)
MCV: 85.8 fL (ref 80.0–100.0)
Platelets: 303 10*3/uL (ref 150–400)
RBC: 4.58 MIL/uL (ref 3.87–5.11)
RDW: 14.4 % (ref 11.5–15.5)
WBC: 6.4 10*3/uL (ref 4.0–10.5)
nRBC: 0 % (ref 0.0–0.2)

## 2018-10-16 LAB — I-STAT BETA HCG BLOOD, ED (MC, WL, AP ONLY): I-stat hCG, quantitative: <5 m[IU]/mL (ref <5)

## 2018-10-16 LAB — TROPONIN I: Troponin I: <0.03 ng/mL (ref <0.03)

## 2018-10-16 MED ORDER — SODIUM CHLORIDE 0.9% FLUSH: 10.0000 mL | INTRAVENOUS | Status: DC | PRN

## 2018-10-16 MED ORDER — SODIUM CHLORIDE 0.9% FLUSH: 10.0000 mL | Freq: Two times a day (BID) | INTRAVENOUS | Status: DC

## 2018-10-16 MED ORDER — SODIUM CHLORIDE 0.9% FLUSH: 3.0000 mL | Freq: Once | INTRAVENOUS | Status: DC

## 2018-10-16 NOTE — ED Notes (Signed)
IV Team RN Completed an IV

## 2018-10-16 NOTE — ED Triage Notes (Signed)
Pt reports she felt like her heart was beating fast since about 0400 this morning, ongoing through the day. She reports she feels some heaviness in her chest, feels SOB and lightheaded. Hx of SVT due to see EP MD on the 23rd.

## 2018-10-16 NOTE — ED Provider Notes (Signed)
[facility] EMERGENCY DEPARTMENT Provider Note   CSN: [id number] Arrival date & time: 10/16/18  1907    History   Chief Complaint Chief Complaint  Patient presents with  . Chest Pain    HPI Jessica Sanchez is a 33 y.o. female.  HPI: A 33 year old patient with a history of obesity presents for evaluation of chest pain. Initial onset of pain was more than 6 hours ago. The patient's chest pain is described as heaviness/pressure/tightness and is not worse with exertion. The patient's chest pain is not middle- or left-sided, is not well-localized, is not sharp and does not radiate to the arms/jaw/neck. The patient does not complain of nausea and denies diaphoresis. The patient has no history of stroke, has no history of peripheral artery disease, has not smoked in the past 90 days, denies any history of treated diabetes, has no relevant family history of coronary artery disease (first degree relative at less than age 22), is not hypertensive and has no history of hypercholesterolemia.   The history is provided by the patient and medical records.  Chest Pain Pain location:  Substernal area Pain quality: aching, pressure and tightness   Pain radiates to:  Does not radiate Pain severity:  Moderate Onset quality:  Sudden Duration:  30 minutes Timing:  Intermittent Progression:  Waxing and waning Chronicity:  Recurrent Context: breathing, movement, raising an arm, at rest and stress   Relieved by:  Nothing Worsened by:  Certain positions and exertion (palpation) Ineffective treatments:  Leaning forward, rest and certain positions Associated symptoms: cough, fatigue, nausea, palpitations, shortness of breath and vomiting   Associated symptoms: no abdominal pain, no back pain, no diaphoresis, no fever, no lower extremity edema and no PND   Associated symptoms comment:  Diarrhea Risk factors: obesity   Risk factors: no coronary artery disease and not female   Risk factors  comment:  Hx of svt   Past Medical History:  Diagnosis Date  . Fibroid (bleeding) (uterine)   . Fibroid, uterine   . H/O metrorrhagia   . IUD (intrauterine device) in place 07/11/2015  . Menorrhagia   . SVT (supraventricular tachycardia) [facility])     Patient Active Problem List   Diagnosis Date Noted  . Sustained SVT ([place]) 10/04/2018  . IUD (intrauterine device) in place 07/11/2015  . Other disorder of menstruation and other abnormal bleeding from female genital tract 10/30/2012    Past Surgical History:  Procedure Laterality Date  . HYSTEROSCOPY W/D&C N/A 09/27/2013   Procedure: DILATATION AND CURETTAGE /HYSTEROSCOPY and insertion of Mirena IUD ;  Surgeon: [name]. Harrington Challenger, MD;  Location: [facility];  Service: Gynecology;  Laterality: N/A;  . MYOMECTOMY  02/03/2011   Procedure: MYOMECTOMY;  Surgeon: [name], MD;  Location: [facility];  Service: Gynecology;  Laterality: N/A;  . TONSILLECTOMY       OB History    Gravida  1   Para      Term      Preterm      AB  1   Living        SAB  1   TAB      Ectopic      Multiple      Live Births               Home Medications    Prior to Admission medications   Medication Sig Start Date End Date Taking? Authorizing Provider  albuterol (PROVENTIL HFA) 108 (90 Base) MCG/ACT  inhaler Inhale 1-2 puffs into the lungs every 6 (six) hours as needed for wheezing or shortness of breath.    Yes [provider]  diltiazem (CARDIZEM CD) 180 MG 24 hr capsule Take 1 capsule (180 mg total) by mouth daily for 21 days. 10/09/18 10/30/18 Yes Murray, Alyssa B, PA-C  ibuprofen (ADVIL,MOTRIN) 600 MG tablet Take 1 tablet (600 mg total) by mouth every 6 (six) hours as needed. Patient taking differently: Take 600 mg by mouth every 6 (six) hours as needed for moderate pain.  05/06/18  Yes IdolAlmyra Free, PA-C  levonorgestrel (MIRENA) 20 MCG/24HR IUD 1 each by Intrauterine route once. EVERY 5 YEARS   Yes [provider]  Multiple  Vitamins-Calcium (ONE-A-DAY WOMENS PO) Take 1 tablet by mouth daily.   Yes [provider]    Family History Family History  Problem Relation Age of Onset  . Cirrhosis Mother   . Diabetes Mother   . Cancer Maternal Grandfather   . Hypertension Sister   . Diabetes Sister   . Anesthesia problems Neg Hx   . Hypotension Neg Hx   . Malignant hyperthermia Neg Hx   . Pseudochol deficiency Neg Hx     Social History Social History   Tobacco Use  . Smoking status: Never Smoker  . Smokeless tobacco: Never Used  Substance Use Topics  . Alcohol use: Yes    Comment: occasional  . Drug use: No     Allergies   Shellfish allergy and Adhesive [tape]   Review of Systems Review of Systems  Constitutional: Positive for fatigue. Negative for diaphoresis and fever.  Respiratory: Positive for cough and shortness of breath.   Cardiovascular: Positive for chest pain and palpitations. Negative for PND.  Gastrointestinal: Positive for nausea and vomiting. Negative for abdominal pain.  Musculoskeletal: Negative for back pain.  All other systems reviewed and are negative.    Physical Exam Updated Vital Signs BP 120/76   Pulse 82   Temp 98.8 F (37.1 C) (Oral)   Resp 19   Ht 5\' 2"  (1.575 m)   Wt 111.6 kg   SpO2 100%   BMI 44.99 kg/m   Physical Exam Vitals signs and nursing note reviewed.  Constitutional:      General: She is not in acute distress.    Appearance: She is well-developed.  HENT:     Head: Normocephalic and atraumatic.  Eyes:     Conjunctiva/sclera: Conjunctivae normal.  Neck:     Musculoskeletal: Neck supple.     Vascular: No JVD.  Cardiovascular:     Rate and Rhythm: Normal rate.     Pulses:          Radial pulses are 2+ on the right side and 2+ on the left side.  Pulmonary:     Effort: Pulmonary effort is normal. No respiratory distress.     Breath sounds: No decreased breath sounds, wheezing or rhonchi.  Abdominal:     Palpations: Abdomen is  soft.     Tenderness: There is no abdominal tenderness.  Musculoskeletal:     Right lower leg: No edema.     Left lower leg: No edema.  Skin:    General: Skin is warm and dry.     Findings: No rash.  Neurological:     General: No focal deficit present.     Mental Status: She is alert and oriented to person, place, and time.      ED Treatments / Results  Labs (all labs  ordered are listed, but only abnormal results are displayed) Labs Reviewed  BASIC METABOLIC PANEL  CBC  TROPONIN I  I-STAT BETA HCG BLOOD, ED ([facility], [facility], [facility] ONLY)    EKG EKG Interpretation  Date/Time:  Monday October 16 2018 19:18:16 EDT Ventricular Rate:  81 PR Interval:  156 QRS Duration: 80 QT Interval:  382 QTC Calculation: 443 R Axis:   2 Text Interpretation:  Normal sinus rhythm No significant change since last tracing Confirmed by Virgel Manifold [id number]) on 10/16/2018 7:58:41 PM   Radiology Dg Chest 2 View  Result Date: 10/16/2018 CLINICAL DATA:  Chest pressure and increasing shortness of breath. EXAM: CHEST - 2 VIEW COMPARISON:  10/09/2018 FINDINGS: Cardiomediastinal silhouette is normal. Mediastinal contours appear intact. Patchy lower lobe predominant interstitial and alveolar opacities. Osseous structures are without acute abnormality. Soft tissues are grossly normal. IMPRESSION: Patchy lower lobe predominant interstitial and alveolar opacities may represent early/atypical pneumonia or atelectasis. Electronically Signed   By: Fidela Salisbury M.D.   On: 10/16/2018 19:41    Procedures Procedures (including critical care time)  Medications Ordered in ED Medications  sodium chloride flush (NS) 0.9 % injection 3 mL (3 mLs Intravenous Not Given 10/16/18 2036)  sodium chloride flush (NS) 0.9 % injection 10-40 mL (has no administration in time range)  sodium chloride flush (NS) 0.9 % injection 10-40 mL (has no administration in time range)     Initial Impression / Assessment and Plan / ED Course  I  have reviewed the triage vital signs and the nursing notes.  Pertinent labs & imaging results that were available during my care of the patient were reviewed by me and considered in my medical decision making (see chart for details).     HEAR Score: 1  Medical Decision Making: Jessica Sanchez is a 33 y.o. female who presented to the ED today with intermittent chest pain, shortness of breath, palpitations.  Past medical history significant for , uterine fibroids and menorrhagia  Reviewed and confirmed nursing documentation for past medical history, family history, social history.  On my initial exam, the pt was calm, cooperative, conversant, follows commands appropriately, GCS 15, not tachycardic, not hypotensive, afebrile, no increased work of breathing or respiratory distress, no signs of impending respiratory failure.   SVT requiring hospitalization on 10/04/2018, discharged on Cardizem, seen on 6/8 for similar, Cardizem dose increased 280 mg 24-hour capsule daily Intermittent episodes of chest tightness with palpitations while at work today, have occurred sporadically over the last 3 days as well  Concern for recurrence of breakthrough arrhythmia No acute EKG changes indicative of ACS, troponin negative Chest x-ray shows left-sided atelectasis, unchanged from prior, no obvious opacity concerning for pneumonia, no obvious pneumothorax, no obvious signs of volume overload No obvious JVD or significant peripheral edema concerning for heart failure on exam Etiology could also be secondary to musculoskeletal etiology given tenderness reproducible palpation, positive rooster crowing sign  EKG (my interpretation):      NS rhythm with a rate of  84.      QRS 80. QTc 443. PR 156.      T wave inversions in V2      No acute ischemic ST-T segment changes.      No acute changes suggestive of hyperkalemia.      No WPW, LQTS, or Brugada's Syndrome. When compared to previous ECGs from 10/10/18 change no  new concerning changes found.  Pregnancy test negative, sodium 136, testing 4.2, creatinine 0.7, white blood cell 6.4, hemoglobin  12.7, troponin negative  All radiology and laboratory studies reviewed independently and with my attending physician, agree with reading provided by radiologist unless otherwise noted.  Upon reassessing patient, patient was calm, resting comfortably.  Educated the patient that she needs to touch bases with cardiology about potentially moving up appointment, may need Holter monitoring or Zio patch as an outpatient for further evaluation and care. Based on the above findings, I believe patient is hemodynamically stable for discharge.   Patient educated about specific return precautions for given chief complaint and symptoms.  Patient educated about follow-up with PCP and Cardiology.  Patient expressed understanding of return precautions and need for follow-up.  Patient discharged.  The above care was discussed with and agreed upon by my attending physician. Emergency Department Medication Summary:  Medications  sodium chloride flush (NS) 0.9 % injection 3 mL (3 mLs Intravenous Not Given 10/16/18 2036)  sodium chloride flush (NS) 0.9 % injection 10-40 mL (has no administration in time range)  sodium chloride flush (NS) 0.9 % injection 10-40 mL (has no administration in time range)   Final Clinical Impressions(s) / ED Diagnoses   Final diagnoses:  Nonspecific chest pain    ED Discharge Orders    None       Lonzo Candy, MD 10/16/18 2308    Sherwood Gambler, MD 10/17/18 1230

## 2018-10-16 NOTE — Discharge Instructions (Signed)

## 2018-10-18 ENCOUNTER — Telehealth: Payer: Self-pay | Admitting: Cardiovascular Disease

## 2018-10-18 NOTE — Telephone Encounter (Signed)
Please give pt a call .. she's needing a note for work stating what her restrictions are.   [phone]

## 2018-10-18 NOTE — Telephone Encounter (Signed)
Returned pt call. No answer, unable to leave message.

## 2018-10-19 NOTE — Telephone Encounter (Signed)
Pt returned call. She asked if she could get a work note to excuse her from work until she sees Dr. Lovena Le next week, as she is still having issues.  I advised her that I would send Dr. Lovena Le the message, but he may not get to it right away. If she has to miss any work between now & the appointment date to write it down, and he will get her a note during visit. She voiced understanding of plan.

## 2018-10-21 ENCOUNTER — Other Ambulatory Visit: Payer: Self-pay

## 2018-10-21 ENCOUNTER — Emergency Department (HOSPITAL_COMMUNITY)
Admission: EM | Admit: 2018-10-21 | Discharge: 2018-10-22 | Disposition: A | Discharge status: Home / Self Care | Payer: No Typology Code available for payment source | Attending: Emergency Medicine | Admitting: Emergency Medicine

## 2018-10-21 ENCOUNTER — Encounter (HOSPITAL_COMMUNITY): Payer: Self-pay

## 2018-10-21 ENCOUNTER — Emergency Department (HOSPITAL_COMMUNITY): Payer: No Typology Code available for payment source

## 2018-10-21 DIAGNOSIS — R0789 Other chest pain: Secondary | ICD-10-CM | POA: Diagnosis not present

## 2018-10-21 LAB — BASIC METABOLIC PANEL
Anion gap: 8 (ref 5–15)
BUN: 13 mg/dL (ref 6–20)
CO2: 23 mmol/L (ref 22–32)
Calcium: 8.9 mg/dL (ref 8.9–10.3)
Chloride: 106 mmol/L (ref 98–111)
Creatinine, Ser: 1.02 mg/dL — ABNORMAL HIGH (ref 0.44–1.00)
GFR calc Af Amer: >60 mL/min (ref >60)
GFR calc non Af Amer: >60 mL/min (ref >60)
Glucose, Bld: 106 mg/dL — ABNORMAL HIGH (ref 70–99)
Potassium: 3.9 mmol/L (ref 3.5–5.1)
Sodium: 137 mmol/L (ref 135–145)

## 2018-10-21 LAB — D-DIMER, QUANTITATIVE: D-Dimer, Quant: 0.29 ug/mL-FEU (ref 0.00–0.50)

## 2018-10-21 LAB — CBC
HCT: 38.7 % (ref 36.0–46.0)
Hemoglobin: 12.2 g/dL (ref 12.0–15.0)
MCH: 27.2 pg (ref 26.0–34.0)
MCHC: 31.5 g/dL (ref 30.0–36.0)
MCV: 86.4 fL (ref 80.0–100.0)
Platelets: 314 10*3/uL (ref 150–400)
RBC: 4.48 MIL/uL (ref 3.87–5.11)
RDW: 14.5 % (ref 11.5–15.5)
WBC: 7.1 10*3/uL (ref 4.0–10.5)
nRBC: 0 % (ref 0.0–0.2)

## 2018-10-21 LAB — HCG, SERUM, QUALITATIVE: Preg, Serum: NEGATIVE

## 2018-10-21 LAB — TROPONIN I: Troponin I: <0.03 ng/mL (ref <0.03)

## 2018-10-21 MED ORDER — SODIUM CHLORIDE 0.9% FLUSH: 3.0000 mL | Freq: Once | INTRAVENOUS | Status: DC

## 2018-10-21 NOTE — ED Triage Notes (Signed)
Pt reports chest pain, shortness of breath, and dizziness that started this morning. PT reports taking all prescribed meds today. Reports decreased appetite. Pt supposed to see heart Dr on the 23rd of this month per report.

## 2018-10-21 NOTE — ED Provider Notes (Signed)
[facility] EMERGENCY DEPARTMENT Provider Note   CSN: [id number] Arrival date & time: 10/21/18  2100    History   Chief Complaint Chief Complaint  Patient presents with  . Chest Pain    HPI Jessica Sanchez is a 33 y.o. female with a history including SVT for which she was recently hospitalized and underwent cardioversion and is taking cardizem for rate control, presenting with a one day history of mid sternal chest pressure associated with shortness of breath and dizziness. The chest pressure symptom was present when she woke this am, with sob being intermittent throughout the day and worsened with exertion. She has found no alleviators.  She denies n/v, denies fevers but was diaphoretic before arrival here and also reports intermittent episodes of palpitation.  She reports a nonproductive cough for the past 6 days.  She also describes swelling in her right lower leg which has been chronic and which she blames on her job requiring standing for 8 hours.  She has had no treatment prior to arrival.  She is scheduled to see Dr. Lovena Le of cardiology in 3 days to discuss ablation for definitive tx of her SVT.  Family hx sig for mother with MI in 15's.    HPI: A 10 year old patient with a history of obesity presents for evaluation of chest pain. Initial onset of pain was more than 6 hours ago. The patient's chest pain is well-localized, is described as heaviness/pressure/tightness and is worse with exertion. The patient reports some diaphoresis. The patient's chest pain is middle- or left-sided, is not sharp and does not radiate to the arms/jaw/neck. The patient does not complain of nausea. The patient has a family history of coronary artery disease in a first-degree relative with onset less than age 24. The patient has no history of stroke, has no history of peripheral artery disease, has not smoked in the past 90 days, denies any history of treated diabetes, is not hypertensive and has no history of  hypercholesterolemia.   HPI  Past Medical History:  Diagnosis Date  . Fibroid (bleeding) (uterine)   . Fibroid, uterine   . H/O metrorrhagia   . IUD (intrauterine device) in place 07/11/2015  . Menorrhagia   . SVT (supraventricular tachycardia) [facility])     Patient Active Problem List   Diagnosis Date Noted  . Sustained SVT ([place]) 10/04/2018  . IUD (intrauterine device) in place 07/11/2015  . Other disorder of menstruation and other abnormal bleeding from female genital tract 10/30/2012    Past Surgical History:  Procedure Laterality Date  . HYSTEROSCOPY W/D&C N/A 09/27/2013   Procedure: DILATATION AND CURETTAGE /HYSTEROSCOPY and insertion of Mirena IUD ;  Surgeon: [name]. Harrington Challenger, MD;  Location: [place] [facility];  Service: Gynecology;  Laterality: N/A;  . MYOMECTOMY  02/03/2011   Procedure: MYOMECTOMY;  Surgeon: [name], MD;  Location: [facility];  Service: Gynecology;  Laterality: N/A;  . TONSILLECTOMY       OB History    Gravida  1   Para      Term      Preterm      AB  1   Living        SAB  1   TAB      Ectopic      Multiple      Live Births               Home Medications    Prior to Admission medications   Medication Sig Start  Date End Date Taking? Authorizing Provider  albuterol (PROVENTIL HFA) 108 (90 Base) MCG/ACT inhaler Inhale 1-2 puffs into the lungs every 6 (six) hours as needed for wheezing or shortness of breath.     [provider]  diltiazem (CARDIZEM CD) 180 MG 24 hr capsule Take 1 capsule (180 mg total) by mouth daily for 21 days. 10/09/18 10/30/18  Langston Masker B, PA-C  ibuprofen (ADVIL,MOTRIN) 600 MG tablet Take 1 tablet (600 mg total) by mouth every 6 (six) hours as needed. Patient taking differently: Take 600 mg by mouth every 6 (six) hours as needed for moderate pain.  05/06/18   [name], PA-C  levonorgestrel (MIRENA) 20 MCG/24HR IUD 1 each by Intrauterine route once. EVERY 5 YEARS    [provider]  Multiple  Vitamins-Calcium (ONE-A-DAY WOMENS PO) Take 1 tablet by mouth daily.    [provider]    Family History Family History  Problem Relation Age of Onset  . Cirrhosis Mother   . Diabetes Mother   . Cancer Maternal Grandfather   . Hypertension Sister   . Diabetes Sister   . Anesthesia problems Neg Hx   . Hypotension Neg Hx   . Malignant hyperthermia Neg Hx   . Pseudochol deficiency Neg Hx     Social History Social History   Tobacco Use  . Smoking status: Never Smoker  . Smokeless tobacco: Never Used  Substance Use Topics  . Alcohol use: Yes    Comment: occasional  . Drug use: No     Allergies   Shellfish allergy and Adhesive [tape]   Review of Systems Review of Systems  Constitutional: Positive for diaphoresis. Negative for chills and fever.  HENT: Negative for congestion and sore throat.   Eyes: Negative.   Respiratory: Positive for cough, chest tightness and shortness of breath.   Cardiovascular: Positive for leg swelling. Negative for chest pain.  Gastrointestinal: Negative for abdominal pain, nausea and vomiting.  Genitourinary: Negative.   Musculoskeletal: Negative for arthralgias, joint swelling and neck pain.  Skin: Negative.  Negative for rash and wound.  Neurological: Positive for dizziness. Negative for weakness, light-headedness, numbness and headaches.  Psychiatric/Behavioral: Negative.      Physical Exam Updated Vital Signs BP 122/67 (BP Location: Right Arm)   Pulse 94   Temp 98.7 F (37.1 C) (Oral)   Resp 18   Ht 5\' 2"  (1.575 m)   Wt 117.9 kg   SpO2 98%   BMI 47.55 kg/m   Physical Exam Vitals signs and nursing note reviewed.  Constitutional:      Appearance: She is well-developed.  HENT:     Head: Normocephalic and atraumatic.  Eyes:     Conjunctiva/sclera: Conjunctivae normal.  Neck:     Musculoskeletal: Normal range of motion.  Cardiovascular:     Rate and Rhythm: Normal rate and regular rhythm.     Heart sounds: Normal  heart sounds.  Pulmonary:     Effort: Pulmonary effort is normal.     Breath sounds: Normal breath sounds. No wheezing.  Abdominal:     General: Bowel sounds are normal.     Palpations: Abdomen is soft.     Tenderness: There is no abdominal tenderness.  Musculoskeletal: Normal range of motion.  Skin:    General: Skin is warm and dry.  Neurological:     Mental Status: She is alert.      ED Treatments / Results  Labs (all labs ordered are listed, but only abnormal results are  displayed) Labs Reviewed  BASIC METABOLIC PANEL - Abnormal; Notable for the following components:      Result Value   Glucose, Bld 106 (*)    Creatinine, Ser 1.02 (*)    All other components within normal limits  CBC  TROPONIN I  HCG, SERUM, QUALITATIVE  D-DIMER, QUANTITATIVE (NOT AT [facility])  TROPONIN I    EKG None  ED ECG REPORT   Date: 10/21/2018  Rate: 95  Rhythm: normal sinus rhythm  QRS Axis: normal  Intervals: normal  ST/T Wave abnormalities: nonspecific T wave changes  Conduction Disutrbances:none  Narrative Interpretation: LA fascicular block, T wave changes unchanged from prior  Old EKG Reviewed: unchanged  I have personally reviewed the EKG tracing and agree with the computerized printout as noted.  Radiology Dg Chest 2 View  Result Date: 10/21/2018 CLINICAL DATA:  Chest pain and shortness of breath. EXAM: CHEST - 2 VIEW COMPARISON:  10/16/2018, multiple priors FINDINGS: Low lung volumes with bronchovascular crowding. Unchanged heart size and mediastinal contours. Slight improved right basilar opacities from prior. Probable left lung base atelectasis. No pleural fluid or pneumothorax. Soft tissue attenuation from habitus limits detailed assessment. IMPRESSION: Low lung volumes with bronchovascular crowding. Improved right basilar opacities from prior exam. Suspect residual left lung base atelectasis. Electronically Signed   By: Keith Rake M.D.   On: 10/21/2018 23:02     Procedures Procedures (including critical care time)  Medications Ordered in ED Medications  sodium chloride flush (NS) 0.9 % injection 3 mL ( Intravenous Canceled Entry 10/21/18 2209)  ketorolac (TORADOL) 30 MG/ML injection 15 mg (has no administration in time range)     Initial Impression / Assessment and Plan / ED Course  I have reviewed the triage vital signs and the nursing notes.  Pertinent labs & imaging results that were available during my care of the patient were reviewed by me and considered in my medical decision making (see chart for details).     HEAR Score: 3  Pt with Heart score 3, with 2 negative troponins here, along with NSR on ekg, Doubt ACS.  No svt while being monitored here.  D dimer negative so PE unlikely.  Lungs clear, doubt pulmonary process. Pt felt safe for dc home, will plan to see Dr Lovena Le in 3 days to arrange ablation procedure.   Final Clinical Impressions(s) / ED Diagnoses   Final diagnoses:  Atypical chest pain    ED Discharge Orders    None       Landis Martins 10/22/18 0116    Noemi Chapel, MD 10/22/18 0145

## 2018-10-21 NOTE — ED Notes (Signed)
Pt to xr 

## 2018-10-22 LAB — TROPONIN I: Troponin I: <0.03 ng/mL (ref <0.03)

## 2018-10-22 MED ORDER — KETOROLAC TROMETHAMINE 30 MG/ML IJ SOLN
15.0000 mg | Freq: Once | INTRAMUSCULAR | Status: AC
  Administered 2018-10-22: 15 mg via INTRAVENOUS
  Filled 2018-10-22: qty 1

## 2018-10-24 ENCOUNTER — Ambulatory Visit (INDEPENDENT_AMBULATORY_CARE_PROVIDER_SITE_OTHER): Payer: No Typology Code available for payment source | Admitting: Internal Medicine

## 2018-10-24 ENCOUNTER — Encounter: Payer: Self-pay | Admitting: Internal Medicine

## 2018-10-24 ENCOUNTER — Other Ambulatory Visit: Payer: Self-pay

## 2018-10-24 VITALS — BP 122/80 | HR 66 | Ht 62.0 in | Wt 269.0 lb

## 2018-10-24 DIAGNOSIS — I471 Supraventricular tachycardia: Secondary | ICD-10-CM | POA: Diagnosis not present

## 2018-10-24 NOTE — Progress Notes (Signed)
HPI Jessica Sanchez is referred today by Dr. Jacinta Shoe for evaluation of SVT. She is a pleasant 33 yo woman with tachypalpitations and know narrow QRS short RP tachycardia at 150/min. The episodes start and stop suddenly. She has had to go to the ED on multiple occaisions and received IV adenosine.  Allergies  Allergen Reactions  . Shellfish Allergy Shortness Of Breath, Itching and Swelling    Mouth became swollen, throat started itching, and developed shortness of breath  . Adhesive [Tape] Itching    Depends on the type of medical tape     Current Outpatient Medications  Medication Sig Dispense Refill  . albuterol (PROVENTIL HFA) 108 (90 Base) MCG/ACT inhaler Inhale 1-2 puffs into the lungs every 6 (six) hours as needed for wheezing or shortness of breath.     . diltiazem (CARDIZEM CD) 180 MG 24 hr capsule Take 1 capsule (180 mg total) by mouth daily for 21 days. 21 capsule 0  . ibuprofen (ADVIL,MOTRIN) 600 MG tablet Take 1 tablet (600 mg total) by mouth every 6 (six) hours as needed. (Patient taking differently: Take 600 mg by mouth every 6 (six) hours as needed for moderate pain. ) 20 tablet 0  . levonorgestrel (MIRENA) 20 MCG/24HR IUD 1 each by Intrauterine route once. EVERY 5 YEARS    . Multiple Vitamins-Calcium (ONE-A-DAY WOMENS PO) Take 1 tablet by mouth daily.     No current facility-administered medications for this visit.      Past Medical History:  Diagnosis Date  . Fibroid (bleeding) (uterine)   . Fibroid, uterine   . H/O metrorrhagia   . IUD (intrauterine device) in place 07/11/2015  . Menorrhagia   . SVT (supraventricular tachycardia) (HCC)     ROS:   All systems reviewed and negative except as noted in the HPI.   Past Surgical History:  Procedure Laterality Date  . HYSTEROSCOPY W/D&C N/A 09/27/2013   Procedure: DILATATION AND CURETTAGE /HYSTEROSCOPY and insertion of Mirena IUD ;  Surgeon: [name]. Harrington Challenger, MD;  Location: [facility];  Service: Gynecology;   Laterality: N/A;  . MYOMECTOMY  02/03/2011   Procedure: MYOMECTOMY;  Surgeon: [name], MD;  Location: [facility];  Service: Gynecology;  Laterality: N/A;  . TONSILLECTOMY       Family History  Problem Relation Age of Onset  . Cirrhosis Mother   . Diabetes Mother   . Cancer Maternal Grandfather   . Hypertension Sister   . Diabetes Sister   . Anesthesia problems Neg Hx   . Hypotension Neg Hx   . Malignant hyperthermia Neg Hx   . Pseudochol deficiency Neg Hx      Social History   Socioeconomic History  . Marital status: Single    Spouse name: Not on file  . Number of children: Not on file  . Years of education: Not on file  . Highest education level: Not on file  Occupational History  . Not on file  Social Needs  . Financial resource strain: Not on file  . Food insecurity    Worry: Not on file    Inability: Not on file  . Transportation needs    Medical: Not on file    Non-medical: Not on file  Tobacco Use  . Smoking status: Never Smoker  . Smokeless tobacco: Never Used  Substance and Sexual Activity  . Alcohol use: Yes    Comment: occasional  . Drug use: No  . Sexual activity: Yes  Birth control/protection: I.U.D.  Lifestyle  . Physical activity    Days per week: Not on file    Minutes per session: Not on file  . Stress: Not on file  Relationships  . Social Herbalist on phone: Not on file    Gets together: Not on file    Attends religious service: Not on file    Active member of club or organization: Not on file    Attends meetings of clubs or organizations: Not on file    Relationship status: Not on file  . Intimate partner violence    Fear of current or ex partner: Not on file    Emotionally abused: Not on file    Physically abused: Not on file    Forced sexual activity: Not on file  Other Topics Concern  . Not on file  Social History Narrative  . Not on file     BP 122/80   Pulse 66   Ht 5\' 2"  (1.575 m)   Wt 269 lb (122 kg)    SpO2 99%   BMI 49.20 kg/m   Physical Exam:  Well appearing overweight woman, NAD HEENT: Unremarkable Neck:  No JVD, no thyromegally Lymphatics:  No adenopathy Back:  No CVA tenderness Lungs:  No increased work of breathing.  HEART:  Regular rate rhythm, no murmurs, no rubs, no clicks Abd:  soft, positive bowel sounds, no organomegally, no rebound, no guarding Ext:  2 plus pulses, no edema, no cyanosis, no clubbing Skin:  No rashes no nodules Neuro:  CN II through XII intact, motor grossly intact  EKG - nsr with no pre-excitation  Assess/Plan: 1. SVT - I have discussed the indications/risks/benefits/goals/expectations of EP study and catheter ablation and she wishes to proceed.  2. Obesity - she will need to work on weight loss.  Mikle Bosworth.D.

## 2018-10-24 NOTE — H&P (View-Only) (Signed)
HPI Jessica Sanchez is referred today by Dr. Jacinta Shoe for evaluation of SVT. She is a pleasant 33 yo woman with tachypalpitations and know narrow QRS short RP tachycardia at 150/min. The episodes start and stop suddenly. She has had to go to the ED on multiple occaisions and received IV adenosine.  Allergies  Allergen Reactions  . Shellfish Allergy Shortness Of Breath, Itching and Swelling    Mouth became swollen, throat started itching, and developed shortness of breath  . Adhesive [Tape] Itching    Depends on the type of medical tape     Current Outpatient Medications  Medication Sig Dispense Refill  . albuterol (PROVENTIL HFA) 108 (90 Base) MCG/ACT inhaler Inhale 1-2 puffs into the lungs every 6 (six) hours as needed for wheezing or shortness of breath.     . diltiazem (CARDIZEM CD) 180 MG 24 hr capsule Take 1 capsule (180 mg total) by mouth daily for 21 days. 21 capsule 0  . ibuprofen (ADVIL,MOTRIN) 600 MG tablet Take 1 tablet (600 mg total) by mouth every 6 (six) hours as needed. (Patient taking differently: Take 600 mg by mouth every 6 (six) hours as needed for moderate pain. ) 20 tablet 0  . levonorgestrel (MIRENA) 20 MCG/24HR IUD 1 each by Intrauterine route once. EVERY 5 YEARS    . Multiple Vitamins-Calcium (ONE-A-DAY WOMENS PO) Take 1 tablet by mouth daily.     No current facility-administered medications for this visit.      Past Medical History:  Diagnosis Date  . Fibroid (bleeding) (uterine)   . Fibroid, uterine   . H/O metrorrhagia   . IUD (intrauterine device) in place 07/11/2015  . Menorrhagia   . SVT (supraventricular tachycardia) (HCC)     ROS:   All systems reviewed and negative except as noted in the HPI.   Past Surgical History:  Procedure Laterality Date  . HYSTEROSCOPY W/D&C N/A 09/27/2013   Procedure: DILATATION AND CURETTAGE /HYSTEROSCOPY and insertion of Mirena IUD ;  Surgeon: [name]. Harrington Challenger, MD;  Location: [place] [facility];  Service: Gynecology;   Laterality: N/A;  . MYOMECTOMY  02/03/2011   Procedure: MYOMECTOMY;  Surgeon: [name], MD;  Location: [facility];  Service: Gynecology;  Laterality: N/A;  . TONSILLECTOMY       Family History  Problem Relation Age of Onset  . Cirrhosis Mother   . Diabetes Mother   . Cancer Maternal Grandfather   . Hypertension Sister   . Diabetes Sister   . Anesthesia problems Neg Hx   . Hypotension Neg Hx   . Malignant hyperthermia Neg Hx   . Pseudochol deficiency Neg Hx      Social History   Socioeconomic History  . Marital status: Single    Spouse name: Not on file  . Number of children: Not on file  . Years of education: Not on file  . Highest education level: Not on file  Occupational History  . Not on file  Social Needs  . Financial resource strain: Not on file  . Food insecurity    Worry: Not on file    Inability: Not on file  . Transportation needs    Medical: Not on file    Non-medical: Not on file  Tobacco Use  . Smoking status: Never Smoker  . Smokeless tobacco: Never Used  Substance and Sexual Activity  . Alcohol use: Yes    Comment: occasional  . Drug use: No  . Sexual activity: Yes  Birth control/protection: I.U.D.  Lifestyle  . Physical activity    Days per week: Not on file    Minutes per session: Not on file  . Stress: Not on file  Relationships  . Social Herbalist on phone: Not on file    Gets together: Not on file    Attends religious service: Not on file    Active member of club or organization: Not on file    Attends meetings of clubs or organizations: Not on file    Relationship status: Not on file  . Intimate partner violence    Fear of current or ex partner: Not on file    Emotionally abused: Not on file    Physically abused: Not on file    Forced sexual activity: Not on file  Other Topics Concern  . Not on file  Social History Narrative  . Not on file     BP 122/80   Pulse 66   Ht 5\' 2"  (1.575 m)   Wt 269 lb (122 kg)    SpO2 99%   BMI 49.20 kg/m   Physical Exam:  Well appearing overweight woman, NAD HEENT: Unremarkable Neck:  No JVD, no thyromegally Lymphatics:  No adenopathy Back:  No CVA tenderness Lungs:  No increased work of breathing.  HEART:  Regular rate rhythm, no murmurs, no rubs, no clicks Abd:  soft, positive bowel sounds, no organomegally, no rebound, no guarding Ext:  2 plus pulses, no edema, no cyanosis, no clubbing Skin:  No rashes no nodules Neuro:  CN II through XII intact, motor grossly intact  EKG - nsr with no pre-excitation  Assess/Plan: 1. SVT - I have discussed the indications/risks/benefits/goals/expectations of EP study and catheter ablation and she wishes to proceed.  2. Obesity - she will need to work on weight loss.  Mikle Bosworth.D.

## 2018-10-24 NOTE — Patient Instructions (Signed)
Medication Instructions:  Your physician recommends that you continue on your current medications as directed. Please refer to the Current Medication list given to you today.  Labwork: You will get lab work today:  BMP and CBC.  Testing/Procedures: Your physician has recommended that you have an ablation. Catheter ablation is a medical procedure used to treat some cardiac arrhythmias (irregular heartbeats). During catheter ablation, a long, thin, flexible tube is put into a blood vessel in your groin (upper thigh), or neck. This tube is called an ablation catheter. It is then guided to your heart through the blood vessel. Radio frequency waves destroy small areas of heart tissue where abnormal heartbeats may cause an arrhythmia to start. Please see the instruction sheet given to you today.  Follow-Up:  You will follow up with Dr. Lovena Le 4 weeks after your procedure.    ABLATION INSTRUCTIONS:  COVID TEST:  You will go to the [facility] [phone] [address]) for your Covid testing.   This is a drive thru test site.  Stay in your car and the nurse team will come to your car to test you.  After you are tested please go home and self quarantine until the day of your procedure.  This will prevent any possible exposure between the test and your procedure.  November 06, 2018 at 10:00 am  Please arrive to ADMITTING down the hall from the [facility] main entrance of [facility] at:  8:30 am on November 08, 2018  Do not eat or drink after midnight prior to procedure  Do NOT take your DILTIAZEM for 2 days prior to your procedure.  Your last dose will be on November 05, 2018  Plan for one night stay but you may be discharged after your procedure. You will need someone to drive you home at discharge  If you need a refill on your cardiac medications before your next appointment, please call your pharmacy.   Cardiac Ablation Cardiac ablation is a procedure to disable (ablate) a small  amount of heart tissue in very specific places. The heart has many electrical connections. Sometimes these connections are abnormal and can cause the heart to beat very fast or irregularly. Ablating some of the problem areas can improve the heart rhythm or return it to normal. Ablation may be done for people who:  Have Wolff-Parkinson-White syndrome.  Have fast heart rhythms (tachycardia).  Have taken medicines for an abnormal heart rhythm (arrhythmia) that were not effective or caused side effects.  Have a high-risk heartbeat that may be life-threatening. During the procedure, a small incision is made in the neck or the groin, and a long, thin, flexible tube (catheter) is inserted into the incision and moved to the heart. Small devices (electrodes) on the tip of the catheter will send out electrical currents. A type of X-ray (fluoroscopy) will be used to help guide the catheter and to provide images of the heart. Tell a health care provider about:  Any allergies you have.  All medicines you are taking, including vitamins, herbs, eye drops, creams, and over-the-counter medicines.  Any problems you or family members have had with anesthetic medicines.  Any blood disorders you have.  Any surgeries you have had.  Any medical conditions you have, such as kidney failure.  Whether you are pregnant or may be pregnant. What are the risks? Generally, this is a safe procedure. However, problems may occur, including:  Infection.  Bruising and bleeding at the catheter insertion site.  Bleeding into the chest, especially into the sac that surrounds the heart. This is a serious complication.  Stroke or blood clots.  Damage to other structures or organs.  Allergic reaction to medicines or dyes.  Need for a permanent pacemaker if the normal electrical system is damaged. A pacemaker is a small computer that sends electrical signals to the heart and helps your heart beat normally.  The  procedure not being fully effective. This may not be recognized until months later. Repeat ablation procedures are sometimes required. What happens before the procedure?  Follow instructions from your health care provider about eating or drinking restrictions.  Ask your health care provider about: ? Changing or stopping your regular medicines. This is especially important if you are taking diabetes medicines or blood thinners. ? Taking medicines such as aspirin and ibuprofen. These medicines can thin your blood. Do not take these medicines before your procedure if your health care provider instructs you not to.  Plan to have someone take you home from the hospital or clinic.  If you will be going home right after the procedure, plan to have someone with you for 24 hours. What happens during the procedure?  To lower your risk of infection: ? Your health care team will wash or sanitize their hands. ? Your skin will be washed with soap. ? Hair may be removed from the incision area.  An IV tube will be inserted into one of your veins.  You will be given a medicine to help you relax (sedative).  The skin on your neck or groin will be numbed.  An incision will be made in your neck or your groin.  A needle will be inserted through the incision and into a large vein in your neck or groin.  A catheter will be inserted into the needle and moved to your heart.  Dye may be injected through the catheter to help your surgeon see the area of the heart that needs treatment.  Electrical currents will be sent from the catheter to ablate heart tissue in desired areas. There are three types of energy that may be used to ablate heart tissue: ? Heat (radiofrequency energy). ? Laser energy. ? Extreme cold (cryoablation).  When the necessary tissue has been ablated, the catheter will be removed.  Pressure will be held on the catheter insertion area to prevent excessive bleeding.  A bandage  (dressing) will be placed over the catheter insertion area. The procedure may vary among health care providers and hospitals. What happens after the procedure?  Your blood pressure, heart rate, breathing rate, and blood oxygen level will be monitored until the medicines you were given have worn off.  Your catheter insertion area will be monitored for bleeding. You will need to lie still for a few hours to ensure that you do not bleed from the catheter insertion area.  Do not drive for 24 hours or as long as directed by your health care provider. Summary  Cardiac ablation is a procedure to disable (ablate) a small amount of heart tissue in very specific places. Ablating some of the problem areas can improve the heart rhythm or return it to normal.  During the procedure, electrical currents will be sent from the catheter to ablate heart tissue in desired areas. This information is not intended to replace advice given to you by your health care provider. Make sure you discuss any questions you have with your health care provider. Document Released: 09/05/2008 Document Revised: 03/08/2016 Document  Reviewed: 03/08/2016 Elsevier Interactive Patient Education  [employer].

## 2018-10-25 LAB — CBC WITH DIFFERENTIAL/PLATELET
Basophils Absolute: 0 10*3/uL (ref 0.0–0.2)
Basos: 1 %
EOS (ABSOLUTE): 0.1 10*3/uL (ref 0.0–0.4)
Eos: 2 %
Hematocrit: 36.8 % (ref 34.0–46.6)
Hemoglobin: 12.4 g/dL (ref 11.1–15.9)
Immature Grans (Abs): 0 10*3/uL (ref 0.0–0.1)
Immature Granulocytes: 0 %
Lymphocytes Absolute: 2.1 10*3/uL (ref 0.7–3.1)
Lymphs: 45 %
MCH: 27.6 pg (ref 26.6–33.0)
MCHC: 33.7 g/dL (ref 31.5–35.7)
MCV: 82 fL (ref 79–97)
Monocytes Absolute: 0.5 10*3/uL (ref 0.1–0.9)
Monocytes: 10 %
Neutrophils Absolute: 1.9 10*3/uL (ref 1.4–7.0)
Neutrophils: 42 %
Platelets: 287 10*3/uL (ref 150–450)
RBC: 4.5 x10E6/uL (ref 3.77–5.28)
RDW: 13.9 % (ref 11.7–15.4)
WBC: 4.6 10*3/uL (ref 3.4–10.8)

## 2018-10-25 LAB — BASIC METABOLIC PANEL
BUN/Creatinine Ratio: 13 (ref 9–23)
BUN: 9 mg/dL (ref 6–20)
CO2: 20 mmol/L (ref 20–29)
Calcium: 9.1 mg/dL (ref 8.7–10.2)
Chloride: 104 mmol/L (ref 96–106)
Creatinine, Ser: 0.68 mg/dL (ref 0.57–1.00)
GFR calc Af Amer: 133 mL/min/{1.73_m2} (ref >59)
GFR calc non Af Amer: 115 mL/min/{1.73_m2} (ref >59)
Glucose: 85 mg/dL (ref 65–99)
Potassium: 4.5 mmol/L (ref 3.5–5.2)
Sodium: 137 mmol/L (ref 134–144)

## 2018-11-02 ENCOUNTER — Ambulatory Visit: Payer: Self-pay | Admitting: Internal Medicine

## 2018-11-03 ENCOUNTER — Encounter (HOSPITAL_COMMUNITY): Payer: Self-pay | Admitting: Emergency Medicine

## 2018-11-03 ENCOUNTER — Emergency Department (HOSPITAL_COMMUNITY)
Admission: EM | Admit: 2018-11-03 | Discharge: 2018-11-03 | Disposition: A | Discharge status: Home / Self Care | Payer: No Typology Code available for payment source | Attending: Emergency Medicine | Admitting: Emergency Medicine

## 2018-11-03 ENCOUNTER — Other Ambulatory Visit: Payer: Self-pay

## 2018-11-03 DIAGNOSIS — I471 Supraventricular tachycardia: Secondary | ICD-10-CM | POA: Diagnosis not present

## 2018-11-03 DIAGNOSIS — R000 Tachycardia, unspecified: Secondary | ICD-10-CM | POA: Diagnosis present

## 2018-11-03 DIAGNOSIS — Z79899 Other long term (current) drug therapy: Secondary | ICD-10-CM | POA: Diagnosis not present

## 2018-11-03 LAB — CBC WITH DIFFERENTIAL/PLATELET
Abs Immature Granulocytes: 0.01 10*3/uL (ref 0.00–0.07)
Basophils Absolute: 0 10*3/uL (ref 0.0–0.1)
Basophils Relative: 0 %
Eosinophils Absolute: 0.1 10*3/uL (ref 0.0–0.5)
Eosinophils Relative: 2 %
HCT: 38.4 % (ref 36.0–46.0)
Hemoglobin: 12.2 g/dL (ref 12.0–15.0)
Immature Granulocytes: 0 %
Lymphocytes Relative: 54 %
Lymphs Abs: 3.2 10*3/uL (ref 0.7–4.0)
MCH: 27.6 pg (ref 26.0–34.0)
MCHC: 31.8 g/dL (ref 30.0–36.0)
MCV: 86.9 fL (ref 80.0–100.0)
Monocytes Absolute: 0.5 10*3/uL (ref 0.1–1.0)
Monocytes Relative: 9 %
Neutro Abs: 2 10*3/uL (ref 1.7–7.7)
Neutrophils Relative %: 35 %
Platelets: 299 10*3/uL (ref 150–400)
RBC: 4.42 MIL/uL (ref 3.87–5.11)
RDW: 14.2 % (ref 11.5–15.5)
WBC: 5.9 10*3/uL (ref 4.0–10.5)
nRBC: 0 % (ref 0.0–0.2)

## 2018-11-03 LAB — BASIC METABOLIC PANEL
Anion gap: 10 (ref 5–15)
BUN: 16 mg/dL (ref 6–20)
CO2: 22 mmol/L (ref 22–32)
Calcium: 8.6 mg/dL — ABNORMAL LOW (ref 8.9–10.3)
Chloride: 107 mmol/L (ref 98–111)
Creatinine, Ser: 0.99 mg/dL (ref 0.44–1.00)
GFR calc Af Amer: >60 mL/min (ref >60)
GFR calc non Af Amer: >60 mL/min (ref >60)
Glucose, Bld: 125 mg/dL — ABNORMAL HIGH (ref 70–99)
Potassium: 3.7 mmol/L (ref 3.5–5.1)
Sodium: 139 mmol/L (ref 135–145)

## 2018-11-03 LAB — TROPONIN I (HIGH SENSITIVITY): Troponin I (High Sensitivity): <2 ng/L (ref <18)

## 2018-11-03 MED ORDER — ADENOSINE 6 MG/2ML IV SOLN
12.0000 mg | Freq: Once | INTRAVENOUS | Status: AC
  Administered 2018-11-03: 03:00:00 12 mg via INTRAVENOUS

## 2018-11-03 MED ORDER — SODIUM CHLORIDE 0.9 % IV BOLUS
500.0000 mL | Freq: Once | INTRAVENOUS | Status: AC
  Administered 2018-11-03: 500 mL via INTRAVENOUS

## 2018-11-03 MED ORDER — DILTIAZEM LOAD VIA INFUSION
10.0000 mg | Freq: Once | INTRAVENOUS | Status: AC
  Administered 2018-11-03: 10 mg via INTRAVENOUS
  Filled 2018-11-03: qty 10

## 2018-11-03 MED ORDER — DILTIAZEM HCL 100 MG IV SOLR
5.0000 mg/h | INTRAVENOUS | Status: DC
  Administered 2018-11-03: 5 mg/h via INTRAVENOUS
  Filled 2018-11-03: qty 100

## 2018-11-03 MED ORDER — ADENOSINE 6 MG/2ML IV SOLN
INTRAVENOUS | Status: AC
  Administered 2018-11-03: 12 mg via INTRAVENOUS
  Filled 2018-11-03: qty 4

## 2018-11-03 MED ORDER — ADENOSINE 6 MG/2ML IV SOLN
INTRAVENOUS | Status: AC
  Administered 2018-11-03: 6 mg via INTRAVENOUS
  Filled 2018-11-03: qty 2

## 2018-11-03 MED ORDER — ADENOSINE 6 MG/2ML IV SOLN
6.0000 mg | Freq: Once | INTRAVENOUS | Status: AC
  Administered 2018-11-03: 03:00:00 6 mg via INTRAVENOUS

## 2018-11-03 NOTE — ED Notes (Signed)
Signature pad not working, discharge instructions reviewed with patient. No questions at this time.

## 2018-11-03 NOTE — ED Notes (Signed)
PT converted to NSR at 0420. Pollina MD notified.

## 2018-11-03 NOTE — ED Triage Notes (Signed)
Pt reports rapid heart rate, dizzy and weakness. Reports scheduled for ablation scheduled for 7/8 but knew that her heart rate was high again tonight.

## 2018-11-03 NOTE — ED Provider Notes (Addendum)
[facility] EMERGENCY DEPARTMENT Provider Note   CSN: [id number] Arrival date & time: 11/03/18  0224    History   Chief Complaint Chief Complaint  Patient presents with  . Tachycardia    HPI Jessica Sanchez is a 33 y.o. female.     Patient presents to the emergency department for evaluation of palpitations.  Patient reports a history of SVT.  She has had multiple episodes over the last month requiring cardioversion.  She reports that she is scheduled for an ablation next week.  Patient states that she had sudden onset of rapid heart rate, heaviness of her chest, shortness of breath and dizziness tonight, similar to previous episodes.     Past Medical History:  Diagnosis Date  . Fibroid (bleeding) (uterine)   . Fibroid, uterine   . H/O metrorrhagia   . IUD (intrauterine device) in place 07/11/2015  . Menorrhagia   . SVT (supraventricular tachycardia) [facility])     Patient Active Problem List   Diagnosis Date Noted  . SVT (supraventricular tachycardia) ([place]) 10/04/2018  . IUD (intrauterine device) in place 07/11/2015  . Other disorder of menstruation and other abnormal bleeding from female genital tract 10/30/2012    Past Surgical History:  Procedure Laterality Date  . HYSTEROSCOPY W/D&C N/A 09/27/2013   Procedure: DILATATION AND CURETTAGE /HYSTEROSCOPY and insertion of Mirena IUD ;  Surgeon: [name]. Harrington Challenger, MD;  Location: [facility];  Service: Gynecology;  Laterality: N/A;  . MYOMECTOMY  02/03/2011   Procedure: MYOMECTOMY;  Surgeon: [name], MD;  Location: [facility];  Service: Gynecology;  Laterality: N/A;  . TONSILLECTOMY       OB History    Gravida  1   Para      Term      Preterm      AB  1   Living        SAB  1   TAB      Ectopic      Multiple      Live Births               Home Medications    Prior to Admission medications   Medication Sig Start Date End Date Taking? Authorizing Provider  albuterol (PROVENTIL HFA) 108 (90 Base) MCG/ACT  inhaler Inhale 1-2 puffs into the lungs every 6 (six) hours as needed for wheezing or shortness of breath.     [provider]  diltiazem (CARDIZEM CD) 180 MG 24 hr capsule Take 1 capsule (180 mg total) by mouth daily for 21 days. 10/09/18 10/31/18  Langston Masker B, PA-C  ibuprofen (ADVIL,MOTRIN) 600 MG tablet Take 1 tablet (600 mg total) by mouth every 6 (six) hours as needed. Patient taking differently: Take 600 mg by mouth every 6 (six) hours as needed for moderate pain.  05/06/18   [name], PA-C  levonorgestrel (MIRENA) 20 MCG/24HR IUD 1 each by Intrauterine route once. EVERY 5 YEARS    [provider]  Multiple Vitamins-Calcium (ONE-A-DAY WOMENS PO) Take 1 tablet by mouth daily.    [provider]    Family History Family History  Problem Relation Age of Onset  . Cirrhosis Mother   . Diabetes Mother   . Cancer Maternal Grandfather   . Hypertension Sister   . Diabetes Sister   . Anesthesia problems Neg Hx   . Hypotension Neg Hx   . Malignant hyperthermia Neg Hx   . Pseudochol deficiency Neg Hx     Social History  Social History   Tobacco Use  . Smoking status: Never Smoker  . Smokeless tobacco: Never Used  Substance Use Topics  . Alcohol use: Yes    Comment: occasional  . Drug use: No     Allergies   Shellfish allergy and Adhesive [tape]   Review of Systems Review of Systems  Respiratory: Positive for chest tightness and shortness of breath.   Cardiovascular: Positive for palpitations.  Neurological: Positive for dizziness.  All other systems reviewed and are negative.    Physical Exam Updated Vital Signs BP 126/77   Pulse 97   Temp 98.4 F (36.9 C) (Oral)   Resp 18   Ht 5\' 2"  (1.575 m)   Wt 122 kg   SpO2 99%   BMI 49.20 kg/m   Physical Exam Vitals signs and nursing note reviewed.  Constitutional:      General: She is not in acute distress.    Appearance: Normal appearance. She is well-developed.  HENT:     Head:  Normocephalic and atraumatic.     Right Ear: Hearing normal.     Left Ear: Hearing normal.     Nose: Nose normal.  Eyes:     Conjunctiva/sclera: Conjunctivae normal.     Pupils: Pupils are equal, round, and reactive to light.  Neck:     Musculoskeletal: Normal range of motion and neck supple.  Cardiovascular:     Rate and Rhythm: Regular rhythm. Tachycardia present.     Heart sounds: S1 normal and S2 normal. No murmur. No friction rub. No gallop.   Pulmonary:     Effort: Pulmonary effort is normal. No respiratory distress.     Breath sounds: Normal breath sounds.  Chest:     Chest wall: No tenderness.  Abdominal:     General: Bowel sounds are normal.     Palpations: Abdomen is soft.     Tenderness: There is no abdominal tenderness. There is no guarding or rebound. Negative signs include Murphy's sign and McBurney's sign.     Hernia: No hernia is present.  Musculoskeletal: Normal range of motion.  Skin:    General: Skin is warm and dry.     Findings: No rash.  Neurological:     Mental Status: She is alert and oriented to person, place, and time.     GCS: GCS eye subscore is 4. GCS verbal subscore is 5. GCS motor subscore is 6.     Cranial Nerves: No cranial nerve deficit.     Sensory: No sensory deficit.     Coordination: Coordination normal.  Psychiatric:        Speech: Speech normal.        Behavior: Behavior normal.        Thought Content: Thought content normal.      ED Treatments / Results  Labs (all labs ordered are listed, but only abnormal results are displayed) Labs Reviewed  BASIC METABOLIC PANEL - Abnormal; Notable for the following components:      Result Value   Glucose, Bld 125 (*)    Calcium 8.6 (*)    All other components within normal limits  SARS CORONAVIRUS 2 (HOSPITAL ORDER, [facility])  CBC WITH DIFFERENTIAL/PLATELET  TROPONIN I (HIGH SENSITIVITY)    EKG None  Radiology No results found.  Procedures  .Cardioversion  Date/Time: 11/03/2018 2:57 AM Performed by: Orpah Greek, MD Authorized by: Orpah Greek, MD   Consent:    Consent obtained:  Verbal and  emergent situation   Consent given by:  Patient   Risks discussed:  Induced arrhythmia Universal protocol:    Procedure explained and questions answered to patient or proxy's satisfaction: yes     Site/side marked: yes     Immediately prior to procedure a time out was called: yes     Patient identity confirmed:  Verbally with patient Pre-procedure details:    Cardioversion basis:  Emergent   Rhythm:  Supraventricular tachycardia   Electrode placement:  Anterior-posterior Patient sedated: No Attempt one:    Cardioversion mode attempt one: Adenosine 6 mg IV push.   Cardioversion outcome attempt one: Brief pause and slowing of rate and then recurrence of narrow complex tachycardia. Attempt two:    Cardioversion mode attempt two: Adenosine 12 mg IV push.   Cardioversion outcome attempt two: Rate slowed and underlying sinus rhythm seen for several beats and then slowly back into narrow complex tachycardia. Post-procedure details:    Patient tolerance of procedure:  Tolerated well, no immediate complications   (including critical care time)  Angiocath insertion Performed by: Orpah Greek  Consent: Verbal consent obtained. Risks and benefits: risks, benefits and alternatives were discussed Time out: Immediately prior to procedure a "time out" was called to verify the correct patient, procedure, equipment, support staff and site/side marked as required.  Preparation: Patient was prepped and draped in the usual sterile fashion.  Vein Location: left antecubital fossa  Ultrasound Guided  Gauge: 20  Normal blood return and flush without difficulty Patient tolerance: Patient tolerated the procedure well with no immediate complications.     Medications Ordered in ED Medications  diltiazem (CARDIZEM)  1 mg/mL load via infusion 10 mg (10 mg Intravenous Bolus from Bag 11/03/18 0305)    And  diltiazem (CARDIZEM) 100 mg in dextrose 5 % 100 mL (1 mg/mL) infusion (20 mg/hr Intravenous Rate/Dose Verify 11/03/18 0426)  adenosine (ADENOCARD) 6 MG/2ML injection 6 mg (6 mg Intravenous Given 11/03/18 0249)  adenosine (ADENOCARD) 6 MG/2ML injection 12 mg (12 mg Intravenous Given 11/03/18 0256)  sodium chloride 0.9 % bolus 500 mL (0 mLs Intravenous Stopped 11/03/18 0326)     Initial Impression / Assessment and Plan / ED Course  I have reviewed the triage vital signs and the nursing notes.  Pertinent labs & imaging results that were available during my care of the patient were reviewed by me and considered in my medical decision making (see chart for details).        Patient presents to the emergency department for evaluation of heart palpitations.  Patient presents with a narrow complex tachycardia that is either SVT or accelerated junctional tachycardia.  This is similar to previous presentations.  In the past she has generally converted with adenosine.  She did have one episode where she failed adenosine and required admission.  She was given adenosine 6 mg IV push followed by 12 mg IV push tonight without conversion.  Patient was then placed on a Cardizem drip and monitored.  She has now converted to sinus rhythm.  She was monitored further, weaned off of the Cardizem.  She has done well.  Vital signs are normal, continues to be in sinus rhythm.  Patient will not require hospitalization, follow-up with cardiology next week for ablation as scheduled.  CRITICAL CARE Performed by: Orpah Greek   Total critical care time: 30 minutes  Critical care time was exclusive of separately billable procedures and treating other patients.  Critical care was necessary to treat or  prevent imminent or life-threatening deterioration.  Critical care was time spent personally by me on the following activities:  development of treatment plan with patient and/or surrogate as well as nursing, discussions with consultants, evaluation of patient's response to treatment, examination of patient, obtaining history from patient or surrogate, ordering and performing treatments and interventions, ordering and review of laboratory studies, ordering and review of radiographic studies, pulse oximetry and re-evaluation of patient's condition.   Final Clinical Impressions(s) / ED Diagnoses   Final diagnoses:  SVT (supraventricular tachycardia) [facility])    ED Discharge Orders    None       Megan Hayduk, Gwenyth Allegra, MD 11/03/18 1443    Orpah Greek, MD 11/03/18 1540    Orpah Greek, MD 11/28/18 [phone]

## 2018-11-06 ENCOUNTER — Other Ambulatory Visit (HOSPITAL_COMMUNITY)
Admission: RE | Admit: 2018-11-06 | Discharge: 2018-11-06 | Disposition: A | Discharge status: Home / Self Care | Payer: No Typology Code available for payment source | Source: Ambulatory Visit | Attending: Internal Medicine | Admitting: Internal Medicine

## 2018-11-06 DIAGNOSIS — Z1159 Encounter for screening for other viral diseases: Secondary | ICD-10-CM | POA: Diagnosis not present

## 2018-11-06 DIAGNOSIS — Z01812 Encounter for preprocedural laboratory examination: Secondary | ICD-10-CM | POA: Diagnosis present

## 2018-11-06 LAB — SARS CORONAVIRUS 2 (TAT 6-24 HRS): SARS Coronavirus 2: NEGATIVE

## 2018-11-08 ENCOUNTER — Ambulatory Visit (HOSPITAL_COMMUNITY)
Admission: RE | Admit: 2018-11-08 | Discharge: 2018-11-09 | Disposition: A | Discharge status: Home / Self Care | Payer: No Typology Code available for payment source | Attending: Internal Medicine | Admitting: Internal Medicine

## 2018-11-08 ENCOUNTER — Ambulatory Visit (HOSPITAL_COMMUNITY)
Admission: RE | Disposition: A | Discharge status: Home / Self Care | Payer: Self-pay | Source: Home / Self Care | Attending: Internal Medicine

## 2018-11-08 ENCOUNTER — Other Ambulatory Visit: Payer: Self-pay

## 2018-11-08 DIAGNOSIS — Z6841 Body Mass Index (BMI) 40.0 and over, adult: Secondary | ICD-10-CM | POA: Diagnosis not present

## 2018-11-08 DIAGNOSIS — E669 Obesity, unspecified: Secondary | ICD-10-CM | POA: Insufficient documentation

## 2018-11-08 DIAGNOSIS — I471 Supraventricular tachycardia: Secondary | ICD-10-CM | POA: Insufficient documentation

## 2018-11-08 HISTORY — PX: SVT ABLATION: EP1225

## 2018-11-08 LAB — PREGNANCY, URINE: Preg Test, Ur: NEGATIVE

## 2018-11-08 SURGERY — SVT ABLATION

## 2018-11-08 MED ORDER — FENTANYL CITRATE (PF) 100 MCG/2ML IJ SOLN
INTRAMUSCULAR | Status: AC
  Filled 2018-11-08: qty 2

## 2018-11-08 MED ORDER — HEPARIN (PORCINE) IN NACL 1000-0.9 UT/500ML-% IV SOLN
INTRAVENOUS | Status: AC
  Filled 2018-11-08: qty 500

## 2018-11-08 MED ORDER — DILTIAZEM HCL ER COATED BEADS 180 MG PO CP24
180.0000 mg | ORAL_CAPSULE | Freq: Every day | ORAL | Status: DC
  Administered 2018-11-08 – 2018-11-09 (×2): 180 mg via ORAL
  Filled 2018-11-08 (×2): qty 1

## 2018-11-08 MED ORDER — ALBUTEROL SULFATE (2.5 MG/3ML) 0.083% IN NEBU: 2.5000 mg | INHALATION_SOLUTION | Freq: Four times a day (QID) | RESPIRATORY_TRACT | Status: DC | PRN

## 2018-11-08 MED ORDER — MIDAZOLAM HCL 5 MG/5ML IJ SOLN
INTRAMUSCULAR | Status: AC
  Filled 2018-11-08: qty 5

## 2018-11-08 MED ORDER — MIDAZOLAM HCL 5 MG/5ML IJ SOLN
INTRAMUSCULAR | Status: DC | PRN
  Administered 2018-11-08 (×3): 1 mg via INTRAVENOUS
  Administered 2018-11-08: 2 mg via INTRAVENOUS

## 2018-11-08 MED ORDER — SODIUM CHLORIDE 0.9% FLUSH: 3.0000 mL | INTRAVENOUS | Status: DC | PRN

## 2018-11-08 MED ORDER — ACETAMINOPHEN 325 MG PO TABS
650.0000 mg | ORAL_TABLET | ORAL | Status: DC | PRN
  Administered 2018-11-08: 650 mg via ORAL
  Filled 2018-11-08: qty 2

## 2018-11-08 MED ORDER — SODIUM CHLORIDE 0.9 % IV SOLN: 250.0000 mL | INTRAVENOUS | Status: DC | PRN

## 2018-11-08 MED ORDER — LEVONORGESTREL 20 MCG/24HR IU IUD: 1.0000 | INTRAUTERINE_SYSTEM | Freq: Once | INTRAUTERINE | Status: DC

## 2018-11-08 MED ORDER — ONDANSETRON HCL 4 MG/2ML IJ SOLN: 4.0000 mg | Freq: Four times a day (QID) | INTRAMUSCULAR | Status: DC | PRN

## 2018-11-08 MED ORDER — MORPHINE SULFATE (PF) 2 MG/ML IV SOLN
2.0000 mg | INTRAVENOUS | Status: DC | PRN
  Administered 2018-11-08: 2 mg via INTRAVENOUS
  Filled 2018-11-08: qty 1

## 2018-11-08 MED ORDER — SODIUM CHLORIDE 0.9% FLUSH
3.0000 mL | Freq: Two times a day (BID) | INTRAVENOUS | Status: DC
  Administered 2018-11-08 – 2018-11-09 (×2): 3 mL via INTRAVENOUS

## 2018-11-08 MED ORDER — BUPIVACAINE HCL (PF) 0.25 % IJ SOLN
INTRAMUSCULAR | Status: AC
  Filled 2018-11-08: qty 30

## 2018-11-08 MED ORDER — FENTANYL CITRATE (PF) 100 MCG/2ML IJ SOLN
INTRAMUSCULAR | Status: DC | PRN
  Administered 2018-11-08: 12.5 ug via INTRAVENOUS
  Administered 2018-11-08: 25 ug via INTRAVENOUS
  Administered 2018-11-08: 12.5 ug via INTRAVENOUS

## 2018-11-08 MED ORDER — BUPIVACAINE HCL (PF) 0.25 % IJ SOLN
INTRAMUSCULAR | Status: DC | PRN
  Administered 2018-11-08: 60 mL

## 2018-11-08 MED ORDER — HEPARIN (PORCINE) IN NACL 2-0.9 UNITS/ML
INTRAMUSCULAR | Status: AC | PRN
  Administered 2018-11-08: 500 mL

## 2018-11-08 MED ORDER — SODIUM CHLORIDE 0.9 % IV SOLN
INTRAVENOUS | Status: DC
  Administered 2018-11-08: 10:00:00 via INTRAVENOUS

## 2018-11-08 SURGICAL SUPPLY — 11 items
BAG SNAP BAND KOVER 36X36 (MISCELLANEOUS) ×1 IMPLANT
CATH CELSIUS THERMO F CV 7FR (ABLATOR) ×1 IMPLANT
CATH HEX JOS 2-5-2 65CM 6F REP (CATHETERS) ×1 IMPLANT
CATH JOSEPH QUAD ALLRED 6F REP (CATHETERS) ×2 IMPLANT
PACK EP LATEX FREE (CUSTOM PROCEDURE TRAY) ×2
PACK EP LF (CUSTOM PROCEDURE TRAY) ×1 IMPLANT
PAD PRO RADIOLUCENT 2001M-C (PAD) ×2 IMPLANT
SHEATH PINNACLE 6F 10CM (SHEATH) ×2 IMPLANT
SHEATH PINNACLE 7F 10CM (SHEATH) ×1 IMPLANT
SHEATH PINNACLE 8F 10CM (SHEATH) ×1 IMPLANT
SHIELD RADPAD SCOOP 12X17 (MISCELLANEOUS) ×1 IMPLANT

## 2018-11-08 NOTE — Progress Notes (Signed)
    2 X 6 Fr. And a 8 Fr. sheaths were pulled out from the R F/V, and manual pressure was held for 10 min. Also, a 7 Fr sheath was pulled out from the R Internal Jugular vein. Sterile gauze was placed on each site. Both sites were soft and non tender.  Bed rest started at 1545 X 6 hr.Instruction were given to patient about this time.  HR NSR 80 BP 113/71 sPO2 97% on R/A

## 2018-11-08 NOTE — Interval H&P Note (Signed)
History and Physical Interval Note:  11/08/2018 12:43 PM  [name]  has presented today for surgery, with the diagnosis of svt.  The various methods of treatment have been discussed with the patient and family. After consideration of risks, benefits and other options for treatment, the patient has consented to  Procedure(s): SVT ABLATION (N/A) as a surgical intervention.  The patient's history has been reviewed, patient examined, no change in status, stable for surgery.  I have reviewed the patient's chart and labs.  Questions were answered to the patient's satisfaction.     [name]

## 2018-11-08 NOTE — Plan of Care (Signed)
Patient in NSR with no complications at this time, will continue to monitor, will call for stronger pain meds for pain at the right femoral site but it's soft with no S/S of complications.

## 2018-11-08 NOTE — Interval H&P Note (Signed)
History and Physical Interval Note:  11/08/2018 12:43 PM  Jessica Sanchez  has presented today for surgery, with the diagnosis of svt.  The various methods of treatment have been discussed with the patient and family. After consideration of risks, benefits and other options for treatment, the patient has consented to  Procedure(s): SVT ABLATION (N/A) as a surgical intervention.  The patient's history has been reviewed, patient examined, no change in status, stable for surgery.  I have reviewed the patient's chart and labs.  Questions were answered to the patient's satisfaction.     [name]

## 2018-11-09 ENCOUNTER — Encounter (HOSPITAL_COMMUNITY): Payer: Self-pay | Admitting: Internal Medicine

## 2018-11-09 DIAGNOSIS — I471 Supraventricular tachycardia: Secondary | ICD-10-CM | POA: Diagnosis not present

## 2018-11-09 LAB — GLUCOSE, CAPILLARY: Glucose-Capillary: 84 mg/dL (ref 70–99)

## 2018-11-09 MED FILL — Midazolam HCl Inj 5 MG/5ML (Base Equivalent): INTRAMUSCULAR | Qty: 5 | Status: AC

## 2018-11-09 NOTE — Discharge Summary (Addendum)
ELECTROPHYSIOLOGY PROCEDURE DISCHARGE SUMMARY    Patient ID: Jessica Sanchez,  MRN: [id number], DOB/AGE: 09-20-1985 33 y.o.  Admit date: 11/08/2018 Discharge date: 11/09/2018  Primary Care Physician: Lucia Gaskins, MD  Primary Cardiologist: Dr. Jacinta Shoe Electrophysiologist: Dr. Lovena Le  Primary Discharge Diagnosis:  1. 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 Shellfish Allergy Shortness Of Breath, Itching and Swelling    Mouth became swollen, throat started itching, and developed shortness of breath  . Adhesive [Tape] Itching    Depends on the type of medical tape     Procedures This Admission: 1.  Electrophysiology study and radiofrequency catheter ablation on 11/08/2018 by Dr Lovena Le.  This study demonstrated CONCLUSIONS:  1. Sinus rhythm upon presentation.  2. The patient had dual AV nodal physiology with easily inducible classic AV nodal reentrant tachycardia, there were no other accessory pathways or arrhythmias induced  3. Successful radiofrequency modification of the slow AV nodal pathway  4. No inducible arrhythmias following ablation.  5. No early apparent complications    Brief HPI: Jessica Sanchez is a 33 y.o. female with a past medical history as outlined above.  She  has had increasing tachypalpitations with documented SVT.  They have failed medical therapy with diltiazem.  Risks, benefits, and alternatives to ablation were reviewed with the patient who wished to proceed.   Hospital Course:  The patient was admitted and underwent EPS/RFCA with details as outlined above. She was monitored on telemetry, (this AM EKG as well) reviewed with Dr.Tamikka Pilger, SR with short PR, and this AM SR with long PR, 1st degree AVblock, she had one very short SVT.   She will continue her diltiazem.  Groin and neck incisions were without complication.  The patient feels well this AM (last  night with some palpitations/dizziness last night), VSS, she was examined and by Dr. Lovena Le and considered stable for discharge to home.  Follow up is in place.  Wound care and restrictions were reviewed with the patient prior to discharge.   Physical Exam: Vitals:   11/08/18 1425 11/08/18 1435 11/08/18 1938 11/09/18 0621  BP:  132/75 110/63 124/77  Pulse: (!) 268 86 78 89  Resp: (!) 0 15 20 20   Temp:   98.4 F (36.9 C) 98.1 F (36.7 C)  TempSrc:   Oral Oral  SpO2: (!) 0%  96% 99%  Weight:    132.1 kg  Height:        GEN- The patient is well appearing, alert and oriented x 3 today.   HEENT: normocephalic, atraumatic; sclera clear, conjunctiva pink; hearing intact; neck supple, no JVP, R IJ site is stable, no bleeding or hematoma, dressing is clean and dry Lymph- no cervical lymphadenopathy Lungs- CTA b/l, normal work of breathing.  No wheezes, rales, rhonchi Heart- RRR, no murmurs, rubs or gallops, PMI not laterally displaced GI- soft, non-tender, non-distended, obese Extremities- no clubbing, cyanosis, or edema; DP/PT/radial pulses 2+ bilaterally, groin without hematoma/bruit, no bleeding or hematoma, dressing is clean and dry MS- no significant deformity or atrophy Skin- warm and dry, no rash or lesion Psych- euthymic mood, full affect Neuro- strength and sensation are intact   Discharge Vitals: Blood pressure 124/77, pulse 89, temperature 98.1 F (36.7 C), temperature source Oral, resp. rate 20, height 5\' 1"  (1.549 m), weight 132.1 kg, SpO2 99 %.    Labs:   Lab Results  Component Value Date  WBC 5.9 11/03/2018   HGB 12.2 11/03/2018   HCT 38.4 11/03/2018   MCV 86.9 11/03/2018   PLT 299 11/03/2018    Recent Labs  Lab 11/03/18 0255  NA 139  K 3.7  CL 107  CO2 22  BUN 16  CREATININE 0.99  CALCIUM 8.6*  GLUCOSE 125*    Discharge Medications:  Allergies as of 11/09/2018      Reactions   Shellfish Allergy Shortness Of Breath, Itching, Swelling   Mouth became  swollen, throat started itching, and developed shortness of breath   Adhesive [tape] Itching   Depends on the type of medical tape      Medication List    TAKE these medications   diltiazem 180 MG 24 hr capsule Commonly known as: CARDIZEM CD Take 1 capsule (180 mg total) by mouth daily for 21 days.   ibuprofen 600 MG tablet Commonly known as: ADVIL Take 1 tablet (600 mg total) by mouth every 6 (six) hours as needed. What changed: reasons to take this   levonorgestrel 20 MCG/24HR IUD Commonly known as: MIRENA 1 each by Intrauterine route once. EVERY 5 YEARS   ONE-A-DAY WOMENS PO Take 1 tablet by mouth daily.   Proventil HFA 108 (90 Base) MCG/ACT inhaler Generic drug: albuterol Inhale 1-2 puffs into the lungs every 6 (six) hours as needed for wheezing or shortness of breath.       Disposition:  Home  Discharge Instructions    Diet - low sodium heart healthy   Complete by: As directed    Increase activity slowly   Complete by: As directed      Follow-up Information    [name], MD Follow up.   Specialty: Cardiology Why: 12/06/2018 @ 12:30PM Contact information: [address] [address] [phone]           Duration of Discharge Encounter: Greater than 30 minutes including physician time.  Venetia Night, PA-C 11/09/2018 9:27 AM  EP attending  Patient seen and examined.  Agree with the findings as noted above.  The patient is status post EP study and catheter ablation of AV node reentrant tachycardia.  Her slow pathway was quite close to the AV node and during ablation, the patient had transient VA block during RF energy application on multiple occasions.  Following ablation she was noninducible.  Overnight she has had brief episodes of accelerated junctional rhythm and several seconds of SVT.  She will be discharged home.  Her exam is normal today with no hematoma.  She will continue her current outpatient medications.   I will see her back in several weeks.  [name], MD

## 2018-11-09 NOTE — Progress Notes (Signed)
Patient C/O feeling lightheaded and dizzy with palpitations, VSS and her EKG this AM looks better than her admission one, Patient still C/O feeling a little sore but her right IJ and femoral sites with dressings remain C/D/I with no S/S of complications, will pass on to daylight to let MD know.

## 2018-11-09 NOTE — Discharge Instructions (Signed)
Post procedure care instructions No driving for 4 days. No lifting over 5 lbs for 1 week. No vigorous or sexual activity for 1 week. You may return to work on 11/15/2018. Keep procedure site clean & dry. If you notice increased pain, swelling, bleeding or pus, call/return!  You may shower, but no soaking baths/hot tubs/pools for 1 week.

## 2018-11-10 ENCOUNTER — Encounter (HOSPITAL_COMMUNITY): Payer: Self-pay | Admitting: Internal Medicine

## 2018-11-11 ENCOUNTER — Emergency Department (HOSPITAL_COMMUNITY): Payer: No Typology Code available for payment source

## 2018-11-11 ENCOUNTER — Observation Stay (HOSPITAL_COMMUNITY)
Admission: EM | Admit: 2018-11-11 | Discharge: 2018-11-12 | Disposition: A | Discharge status: Home / Self Care | Payer: No Typology Code available for payment source | Attending: Internal Medicine | Admitting: Internal Medicine

## 2018-11-11 ENCOUNTER — Encounter (HOSPITAL_COMMUNITY): Payer: Self-pay | Admitting: Emergency Medicine

## 2018-11-11 ENCOUNTER — Other Ambulatory Visit: Payer: Self-pay

## 2018-11-11 DIAGNOSIS — R079 Chest pain, unspecified: Secondary | ICD-10-CM

## 2018-11-11 DIAGNOSIS — Z1159 Encounter for screening for other viral diseases: Secondary | ICD-10-CM | POA: Diagnosis not present

## 2018-11-11 DIAGNOSIS — I471 Supraventricular tachycardia, unspecified: Secondary | ICD-10-CM | POA: Diagnosis present

## 2018-11-11 DIAGNOSIS — Z888 Allergy status to other drugs, medicaments and biological substances status: Secondary | ICD-10-CM | POA: Insufficient documentation

## 2018-11-11 DIAGNOSIS — R7989 Other specified abnormal findings of blood chemistry: Secondary | ICD-10-CM | POA: Diagnosis present

## 2018-11-11 DIAGNOSIS — Z793 Long term (current) use of hormonal contraceptives: Secondary | ICD-10-CM | POA: Diagnosis not present

## 2018-11-11 DIAGNOSIS — Z791 Long term (current) use of non-steroidal anti-inflammatories (NSAID): Secondary | ICD-10-CM | POA: Diagnosis not present

## 2018-11-11 DIAGNOSIS — E860 Dehydration: Secondary | ICD-10-CM | POA: Insufficient documentation

## 2018-11-11 DIAGNOSIS — K219 Gastro-esophageal reflux disease without esophagitis: Secondary | ICD-10-CM

## 2018-11-11 DIAGNOSIS — Z6841 Body Mass Index (BMI) 40.0 and over, adult: Secondary | ICD-10-CM | POA: Diagnosis not present

## 2018-11-11 DIAGNOSIS — I10 Essential (primary) hypertension: Secondary | ICD-10-CM | POA: Insufficient documentation

## 2018-11-11 DIAGNOSIS — Z79899 Other long term (current) drug therapy: Secondary | ICD-10-CM | POA: Diagnosis not present

## 2018-11-11 DIAGNOSIS — R0789 Other chest pain: Secondary | ICD-10-CM | POA: Insufficient documentation

## 2018-11-11 DIAGNOSIS — E66813 Obesity, class 3: Secondary | ICD-10-CM

## 2018-11-11 HISTORY — DX: Morbid (severe) obesity due to excess calories: E66.01

## 2018-11-11 HISTORY — DX: Obesity, class 3: E66.813

## 2018-11-11 LAB — MAGNESIUM: Magnesium: 2 mg/dL (ref 1.7–2.4)

## 2018-11-11 LAB — CBC WITH DIFFERENTIAL/PLATELET
Abs Immature Granulocytes: 0.01 10*3/uL (ref 0.00–0.07)
Basophils Absolute: 0 10*3/uL (ref 0.0–0.1)
Basophils Relative: 1 %
Eosinophils Absolute: 0.1 10*3/uL (ref 0.0–0.5)
Eosinophils Relative: 1 %
HCT: 42.8 % (ref 36.0–46.0)
Hemoglobin: 13.7 g/dL (ref 12.0–15.0)
Immature Granulocytes: 0 %
Lymphocytes Relative: 43 %
Lymphs Abs: 3 10*3/uL (ref 0.7–4.0)
MCH: 27.5 pg (ref 26.0–34.0)
MCHC: 32 g/dL (ref 30.0–36.0)
MCV: 85.8 fL (ref 80.0–100.0)
Monocytes Absolute: 0.6 10*3/uL (ref 0.1–1.0)
Monocytes Relative: 9 %
Neutro Abs: 3.3 10*3/uL (ref 1.7–7.7)
Neutrophils Relative %: 46 %
Platelets: 290 10*3/uL (ref 150–400)
RBC: 4.99 MIL/uL (ref 3.87–5.11)
RDW: 14.2 % (ref 11.5–15.5)
WBC: 7 10*3/uL (ref 4.0–10.5)
nRBC: 0 % (ref 0.0–0.2)

## 2018-11-11 LAB — TROPONIN I (HIGH SENSITIVITY)
Troponin I (High Sensitivity): 95 ng/L — ABNORMAL HIGH (ref <18)
Troponin I (High Sensitivity): 90 ng/L — ABNORMAL HIGH (ref <18)

## 2018-11-11 LAB — BASIC METABOLIC PANEL
Anion gap: 8 (ref 5–15)
BUN: 13 mg/dL (ref 6–20)
CO2: 21 mmol/L — ABNORMAL LOW (ref 22–32)
Calcium: 9 mg/dL (ref 8.9–10.3)
Chloride: 107 mmol/L (ref 98–111)
Creatinine, Ser: 0.71 mg/dL (ref 0.44–1.00)
GFR calc Af Amer: >60 mL/min (ref >60)
GFR calc non Af Amer: >60 mL/min (ref >60)
Glucose, Bld: 92 mg/dL (ref 70–99)
Potassium: 3.7 mmol/L (ref 3.5–5.1)
Sodium: 136 mmol/L (ref 135–145)

## 2018-11-11 LAB — D-DIMER, QUANTITATIVE: D-Dimer, Quant: 1.02 ug/mL-FEU — ABNORMAL HIGH (ref 0.00–0.50)

## 2018-11-11 LAB — SARS CORONAVIRUS 2 BY RT PCR (HOSPITAL ORDER, PERFORMED IN ~~LOC~~ HOSPITAL LAB): SARS Coronavirus 2: NEGATIVE

## 2018-11-11 LAB — CBG MONITORING, ED: Glucose-Capillary: 83 mg/dL (ref 70–99)

## 2018-11-11 LAB — PREGNANCY, URINE: Preg Test, Ur: NEGATIVE

## 2018-11-11 MED ORDER — ENOXAPARIN SODIUM 60 MG/0.6ML ~~LOC~~ SOLN: 60.0000 mg | SUBCUTANEOUS | Status: DC

## 2018-11-11 MED ORDER — IBUPROFEN 400 MG PO TABS: 600.0000 mg | ORAL_TABLET | Freq: Four times a day (QID) | ORAL | Status: DC | PRN

## 2018-11-11 MED ORDER — ASPIRIN 81 MG PO CHEW
324.0000 mg | CHEWABLE_TABLET | Freq: Once | ORAL | Status: AC
  Administered 2018-11-11: 324 mg via ORAL
  Filled 2018-11-11: qty 4

## 2018-11-11 MED ORDER — DILTIAZEM HCL ER COATED BEADS 180 MG PO CP24
180.0000 mg | ORAL_CAPSULE | Freq: Every day | ORAL | Status: DC
  Administered 2018-11-12: 180 mg via ORAL
  Filled 2018-11-11: qty 1

## 2018-11-11 MED ORDER — MAGNESIUM SULFATE 2 GM/50ML IV SOLN
2.0000 g | Freq: Once | INTRAVENOUS | Status: AC
  Administered 2018-11-11: 2 g via INTRAVENOUS
  Filled 2018-11-11: qty 50

## 2018-11-11 MED ORDER — PROCHLORPERAZINE EDISYLATE 10 MG/2ML IJ SOLN
10.0000 mg | Freq: Four times a day (QID) | INTRAMUSCULAR | Status: DC | PRN
  Administered 2018-11-11: 10 mg via INTRAVENOUS
  Filled 2018-11-11: qty 2

## 2018-11-11 MED ORDER — POTASSIUM CHLORIDE IN NACL 40-0.9 MEQ/L-% IV SOLN: INTRAVENOUS | Status: DC

## 2018-11-11 MED ORDER — POTASSIUM CHLORIDE IN NACL 40-0.9 MEQ/L-% IV SOLN
INTRAVENOUS | Status: AC
  Administered 2018-11-11: 250 mL/h via INTRAVENOUS
  Filled 2018-11-11: qty 1000

## 2018-11-11 MED ORDER — CHLORHEXIDINE GLUCONATE CLOTH 2 % EX PADS
6.0000 | MEDICATED_PAD | Freq: Every day | CUTANEOUS | Status: DC
  Administered 2018-11-11 – 2018-11-12 (×2): 6 via TOPICAL

## 2018-11-11 MED ORDER — ACETAMINOPHEN 325 MG PO TABS: 650.0000 mg | ORAL_TABLET | ORAL | Status: DC | PRN

## 2018-11-11 MED ORDER — IOHEXOL 350 MG/ML SOLN
100.0000 mL | Freq: Once | INTRAVENOUS | Status: AC | PRN
  Administered 2018-11-11: 100 mL via INTRAVENOUS

## 2018-11-11 MED ORDER — ENOXAPARIN SODIUM 40 MG/0.4ML ~~LOC~~ SOLN: 40.0000 mg | SUBCUTANEOUS | Status: DC

## 2018-11-11 MED ORDER — LACTATED RINGERS IV BOLUS
1000.0000 mL | Freq: Once | INTRAVENOUS | Status: AC
  Administered 2018-11-11: 1000 mL via INTRAVENOUS

## 2018-11-11 MED ORDER — KETOROLAC TROMETHAMINE 15 MG/ML IJ SOLN
15.0000 mg | Freq: Once | INTRAMUSCULAR | Status: AC
  Administered 2018-11-11: 15 mg via INTRAVENOUS
  Filled 2018-11-11: qty 1

## 2018-11-11 MED ORDER — KETOROLAC TROMETHAMINE 30 MG/ML IJ SOLN
30.0000 mg | Freq: Four times a day (QID) | INTRAMUSCULAR | Status: DC | PRN
  Administered 2018-11-11: 30 mg via INTRAVENOUS
  Filled 2018-11-11: qty 1

## 2018-11-11 MED ORDER — LEVALBUTEROL TARTRATE 45 MCG/ACT IN AERO: 2.0000 | INHALATION_SPRAY | Freq: Four times a day (QID) | RESPIRATORY_TRACT | Status: DC | PRN

## 2018-11-11 NOTE — ED Provider Notes (Signed)
[facility] EMERGENCY DEPARTMENT Provider Note   CSN: [id number] Arrival date & time: 11/11/18  1514     History   Chief Complaint Chief Complaint  Patient presents with   Dizziness    HPI Jessica Sanchez is a 33 y.o. female.     HPI  33 year old female presents with weakness/dizziness.  On 7/8 she had an ablation for SVT.  She states that she is had some soreness at the site but otherwise feeling okay.  Some on and off palpitations.  Since around 2 AM she has been having palpitations and tachycardia, diffuse weakness and lightheadedness with any type of sitting or standing.  Has not passed out.  She is also been having chest pressure in the middle of her chest since around 2 AM.  It is somewhat pleuritic.  Otherwise nothing makes it better or worse.  There is some shortness of breath and intermittent dry cough.  No leg swelling.  No vomiting or radiation of the chest pain.  She states her mom had a heart attack in her 33s but she otherwise denies hypertension, hyperlipidemia, diabetes.  Does not smoke.  Pain is currently a 10/10 in her chest.  Has not take anything for the pain. No focal weakness.  Past Medical History:  Diagnosis Date   Fibroid (bleeding) (uterine)    Fibroid, uterine    H/O metrorrhagia    IUD (intrauterine device) in place 07/11/2015   Menorrhagia    SVT (supraventricular tachycardia) [facility])     Patient Active Problem List   Diagnosis Date Noted   SVT (supraventricular tachycardia) ([place]) 10/04/2018   IUD (intrauterine device) in place 07/11/2015   Other disorder of menstruation and other abnormal bleeding from female genital tract 10/30/2012    Past Surgical History:  Procedure Laterality Date   HYSTEROSCOPY W/D&C N/A 09/27/2013   Procedure: DILATATION AND CURETTAGE /HYSTEROSCOPY and insertion of Mirena IUD ;  Surgeon: [name]. Harrington Challenger, MD;  Location: [place] [facility];  Service: Gynecology;  Laterality: N/A;   MYOMECTOMY  02/03/2011   Procedure: MYOMECTOMY;   Surgeon: [name], MD;  Location: [facility];  Service: Gynecology;  Laterality: N/A;   SVT ABLATION N/A 11/08/2018   Procedure: SVT ABLATION;  Surgeon: [name], MD;  Location: [place] [facility];  Service: Cardiovascular;  Laterality: N/A;   TONSILLECTOMY       OB History    Gravida  1   Para      Term      Preterm      AB  1   Living        SAB  1   TAB      Ectopic      Multiple      Live Births               Home Medications    Prior to Admission medications   Medication Sig Start Date End Date Taking? Authorizing Provider  albuterol (PROVENTIL HFA) 108 (90 Base) MCG/ACT inhaler Inhale 1-2 puffs into the lungs every 6 (six) hours as needed for wheezing or shortness of breath.     [provider]  diltiazem (CARDIZEM CD) 180 MG 24 hr capsule Take 1 capsule (180 mg total) by mouth daily for 21 days. 10/09/18 10/31/18  Langston Masker B, PA-C  ibuprofen (ADVIL,MOTRIN) 600 MG tablet Take 1 tablet (600 mg total) by mouth every 6 (six) hours as needed. Patient taking differently: Take 600 mg by mouth every 6 (  six) hours as needed for moderate pain.  05/06/18   [name], PA-C  levonorgestrel (MIRENA) 20 MCG/24HR IUD 1 each by Intrauterine route once. EVERY 5 YEARS    [provider]  Multiple Vitamins-Calcium (ONE-A-DAY WOMENS PO) Take 1 tablet by mouth daily.    [provider]    Family History Family History  Problem Relation Age of Onset   Cirrhosis Mother    Diabetes Mother    Cancer Maternal Grandfather    Hypertension Sister    Diabetes Sister    Anesthesia problems Neg Hx    Hypotension Neg Hx    Malignant hyperthermia Neg Hx    Pseudochol deficiency Neg Hx     Social History Social History   Tobacco Use   Smoking status: Never Smoker   Smokeless tobacco: Never Used  Substance Use Topics   Alcohol use: Yes    Comment: occasional   Drug use: No     Allergies   Shellfish allergy and Adhesive  [tape]   Review of Systems Review of Systems  Constitutional: Negative for fever.  Respiratory: Positive for cough and shortness of breath.   Cardiovascular: Positive for chest pain and palpitations. Negative for leg swelling.  Gastrointestinal: Negative for abdominal pain and vomiting.  Neurological: Positive for weakness and light-headedness.  All other systems reviewed and are negative.    Physical Exam Updated Vital Signs BP 115/85 (BP Location: Right Arm)    Pulse (!) 105    Temp 98.5 F (36.9 C) (Oral)    Resp 18    Ht 5\' 2"  (1.575 m)    Wt 121.1 kg    SpO2 97%    BMI 48.83 kg/m   Physical Exam Vitals signs and nursing note reviewed.  Constitutional:      General: She is not in acute distress.    Appearance: She is well-developed. She is obese. She is not ill-appearing or diaphoretic.  HENT:     Head: Normocephalic and atraumatic.     Right Ear: External ear normal.     Left Ear: External ear normal.     Nose: Nose normal.  Eyes:     General:        Right eye: No discharge.        Left eye: No discharge.  Cardiovascular:     Rate and Rhythm: Regular rhythm. Tachycardia present.     Pulses:          Radial pulses are 2+ on the right side and 2+ on the left side.     Heart sounds: Normal heart sounds.  Pulmonary:     Effort: Pulmonary effort is normal.     Breath sounds: Normal breath sounds.  Chest:     Chest wall: Tenderness (mid and left anterior chest, states it feels like the pain she's having) present.  Abdominal:     Palpations: Abdomen is soft.     Tenderness: There is no abdominal tenderness.  Skin:    General: Skin is warm and dry.  Neurological:     Mental Status: She is alert.  Psychiatric:        Mood and Affect: Mood is not anxious.      ED Treatments / Results  Labs (all labs ordered are listed, but only abnormal results are displayed) Labs Reviewed  BASIC METABOLIC PANEL - Abnormal; Notable for the following components:      Result  Value   CO2 21 (*)    All other components  within normal limits  D-DIMER, QUANTITATIVE (NOT AT [facility]) - Abnormal; Notable for the following components:   D-Dimer, Quant 1.02 (*)    All other components within normal limits  TROPONIN I (HIGH SENSITIVITY) - Abnormal; Notable for the following components:   Troponin I (High Sensitivity) 95.00 (*)    All other components within normal limits  TROPONIN I (HIGH SENSITIVITY) - Abnormal; Notable for the following components:   Troponin I (High Sensitivity) 90.00 (*)    All other components within normal limits  SARS CORONAVIRUS 2 (HOSPITAL ORDER, [place] [facility])  PREGNANCY, URINE  CBC WITH DIFFERENTIAL/PLATELET  MAGNESIUM  CBG MONITORING, ED    EKG EKG Interpretation  Date/Time:  Saturday November 11 2018 15:42:37 EDT Ventricular Rate:  111 PR Interval:    QRS Duration: 82 QT Interval:  247 QTC Calculation: 336 R Axis:   38 Text Interpretation:  Sinus or ectopic atrial tachycardia Prolonged PR interval appears similar to November 09 2018 Reconfirmed by Sherwood Gambler [phone]) on 11/11/2018 4:04:07 PM   EKG Interpretation  Date/Time:  Saturday November 11 2018 17:52:57 EDT Ventricular Rate:  96 PR Interval:    QRS Duration: 95 QT Interval:  354 QTC Calculation: 448 R Axis:   3 Text Interpretation:  Sinus rhythm Nonspecific T abnormalities, diffuse leads nonspecific T waves similar to earlier in the day and previous ECGs Confirmed by Sherwood Gambler [phone]) on 11/11/2018 5:55:12 PM       Radiology Dg Chest Portable 1 View  Result Date: 11/11/2018 CLINICAL DATA:  Cardiac ablation.  Weakness and dizziness. EXAM: PORTABLE CHEST 1 VIEW COMPARISON:  October 21, 2018 FINDINGS: The heart size and mediastinal contours are within normal limits. Both lungs are clear. The visualized skeletal structures are unremarkable. IMPRESSION: No active disease. Electronically Signed   By: Dorise Bullion III M.D   On: 11/11/2018 16:34     Procedures Procedures (including critical care time)  Medications Ordered in ED Medications  aspirin chewable tablet 324 mg (has no administration in time range)  lactated ringers bolus 1,000 mL (1,000 mLs Intravenous New Bag/Given 11/11/18 1635)  ketorolac (TORADOL) 15 MG/ML injection 15 mg (15 mg Intravenous Given 11/11/18 1643)  iohexol (OMNIPAQUE) 350 MG/ML injection 100 mL (100 mLs Intravenous Contrast Given 11/11/18 1721)     Initial Impression / Assessment and Plan / ED Course  I have reviewed the triage vital signs and the nursing notes.  Pertinent labs & imaging results that were available during my care of the patient were reviewed by me and considered in my medical decision making (see chart for details).     HEAR Score: 3  I discussed work-up and findings with Dr. Rhae Hammock of cardiology at [facility].  He feels reassured and this is probably from ablation.  Feels is reasonable to have overnight observation and probable echo in the morning.  Work-up for PE is negative.  Discussed with Dr. Olevia Bowens who will admit.  Jessica Sanchez was evaluated in Emergency Department on 11/11/2018 for the symptoms described in the history of present illness. She was evaluated in the context of the global COVID-19 pandemic, which necessitated consideration that the patient might be at risk for infection with the SARS-CoV-2 virus that causes COVID-19. Institutional protocols and algorithms that pertain to the evaluation of patients at risk for COVID-19 are in a state of rapid change based on information released by regulatory bodies including the CDC and federal and state organizations. These policies and algorithms were followed  during the patient's care in the ED.   Final Clinical Impressions(s) / ED Diagnoses   Final diagnoses:  Nonspecific chest pain    ED Discharge Orders    None       Sherwood Gambler, MD 11/11/18 2039

## 2018-11-11 NOTE — ED Notes (Signed)
ED TO INPATIENT HANDOFF REPORT  ED Nurse Name and Phone #:  Lonn Georgia [phone]  S Name/Age/Gender Jessica Sanchez 33 y.o. female Room/Bed: APA06/APA06  Code Status   Code Status: Full Code  Home/SNF/Other Home Patient oriented to: self, place, time and situation Is this baseline? Yes   Triage Complete: Triage complete  Chief Complaint weakness  Triage Note Had cardiac ablation done on Wednesday.  States h/r has been up to 130's today.  Reports feeling weak and dizzy.     Allergies Allergies  Allergen Reactions  . Shellfish Allergy Shortness Of Breath, Itching and Swelling    Mouth became swollen, throat started itching, and developed shortness of breath  . Adhesive [Tape] Itching    Depends on the type of medical tape    Level of Care/Admitting Diagnosis ED Disposition    ED Disposition Condition [place] [facility]: [facility] [[id number]]  Level of Care: Stepdown [14]  Covid Evaluation: Confirmed COVID Negative  Diagnosis: SVT (supraventricular tachycardia) [facility]) [[id number]]  Admitting Physician: Reubin Milan [[id number]]  Attending Physician: Reubin Milan [[id number]]  PT Class (Do Not Modify): Observation [104]  PT Acc Code (Do Not Modify): Observation [10022]       B Medical/Surgery History Past Medical History:  Diagnosis Date  . Fibroid (bleeding) (uterine)   . Fibroid, uterine   . H/O metrorrhagia   . IUD (intrauterine device) in place 07/11/2015  . Menorrhagia   . Obesity, Class III, BMI 40-49.9 (morbid obesity) ([place]) 11/11/2018  . SVT (supraventricular tachycardia) (HCC)    Past Surgical History:  Procedure Laterality Date  . HYSTEROSCOPY W/D&C N/A 09/27/2013   Procedure: DILATATION AND CURETTAGE /HYSTEROSCOPY and insertion of Mirena IUD ;  Surgeon: [name]. Harrington Challenger, MD;  Location: [place] [facility];  Service: Gynecology;  Laterality: N/A;  . MYOMECTOMY  02/03/2011   Procedure: MYOMECTOMY;  Surgeon: [name], MD;  Location: [facility];   Service: Gynecology;  Laterality: N/A;  . SVT ABLATION N/A 11/08/2018   Procedure: SVT ABLATION;  Surgeon: [name], MD;  Location: [place] [facility];  Service: Cardiovascular;  Laterality: N/A;  . TONSILLECTOMY       A IV Location/Drains/Wounds Patient Lines/Drains/Airways Status   Active Line/Drains/Airways    Name:   Placement date:   Placement time:   Site:   Days:   Peripheral IV 11/11/18 Right Antecubital   11/11/18    1634    Antecubital   less than 1          Intake/Output Last 24 hours No intake or output data in the 24 hours ending 11/11/18 2053  Labs/Imaging Results for orders placed or performed during the hospital encounter of 11/11/18 (from the past 48 hour(s))  Pregnancy, urine     Status: None   Collection Time: 11/11/18  4:06 PM  Result Value Ref Range   Preg Test, Ur NEGATIVE NEGATIVE    Comment:        THE SENSITIVITY OF THIS METHODOLOGY IS >20 mIU/mL. Performed at [facility], [address], [place], [place] [address]   Basic metabolic panel     Status: Abnormal   Collection Time: 11/11/18  4:36 PM  Result Value Ref Range   Sodium 136 135 - 145 mmol/L   Potassium 3.7 3.5 - 5.1 mmol/L   Chloride 107 98 - 111 mmol/L   CO2 21 (L) 22 - 32 mmol/L   Glucose, Bld 92 70 - 99 mg/dL   BUN  13 6 - 20 mg/dL   Creatinine, Ser 0.71 0.44 - 1.00 mg/dL   Calcium 9.0 8.9 - 10.3 mg/dL   GFR calc non Af Amer >60 >60 mL/min   GFR calc Af Amer >60 >60 mL/min   Anion gap 8 5 - 15    Comment: Performed at [facility], [address], [place], [place] [address]  Troponin I (High Sensitivity)     Status: Abnormal   Collection Time: 11/11/18  4:36 PM  Result Value Ref Range   Troponin I (High Sensitivity) 95.00 (H) <18 ng/L    Comment: (NOTE) Elevated high sensitivity troponin I (hsTnI) values and significant  changes across serial measurements may suggest ACS but many other  chronic and acute conditions are known to elevate hsTnI results.  Refer to the "Links"  section for chest pain algorithms and additional  guidance. Performed at [facility], [address], [place], [place] [address]   CBC with Differential     Status: None   Collection Time: 11/11/18  4:36 PM  Result Value Ref Range   WBC 7.0 4.0 - 10.5 K/uL   RBC 4.99 3.87 - 5.11 MIL/uL   Hemoglobin 13.7 12.0 - 15.0 g/dL   HCT 42.8 36.0 - 46.0 %   MCV 85.8 80.0 - 100.0 fL   MCH 27.5 26.0 - 34.0 pg   MCHC 32.0 30.0 - 36.0 g/dL   RDW 14.2 11.5 - 15.5 %   Platelets 290 150 - 400 K/uL   nRBC 0.0 0.0 - 0.2 %   Neutrophils Relative % 46 %   Neutro Abs 3.3 1.7 - 7.7 K/uL   Lymphocytes Relative 43 %   Lymphs Abs 3.0 0.7 - 4.0 K/uL   Monocytes Relative 9 %   Monocytes Absolute 0.6 0.1 - 1.0 K/uL   Eosinophils Relative 1 %   Eosinophils Absolute 0.1 0.0 - 0.5 K/uL   Basophils Relative 1 %   Basophils Absolute 0.0 0.0 - 0.1 K/uL   Immature Granulocytes 0 %   Abs Immature Granulocytes 0.01 0.00 - 0.07 K/uL    Comment: Performed at [facility], [address], [place], [place] [address]  D-dimer, quantitative     Status: Abnormal   Collection Time: 11/11/18  4:36 PM  Result Value Ref Range   D-Dimer, Quant 1.02 (H) 0.00 - 0.50 ug/mL-FEU    Comment: (NOTE) At the manufacturer cut-off of 0.50 ug/mL FEU, this assay has been documented to exclude PE with a sensitivity and negative predictive value of 97 to 99%.  At this time, this assay has not been approved by the FDA to exclude DVT/VTE. Results should be correlated with clinical presentation. Performed at [facility], [address] [place], [place] [address]   Magnesium     Status: None   Collection Time: 11/11/18  4:36 PM  Result Value Ref Range   Magnesium 2.0 1.7 - 2.4 mg/dL    Comment: Performed at [facility], [address] [place], [place] [address]  CBG monitoring, ED     Status: None   Collection Time: 11/11/18  4:42 PM  Result Value Ref Range   Glucose-Capillary 83 70 - 99 mg/dL  Troponin I (High Sensitivity)      Status: Abnormal   Collection Time: 11/11/18  6:11 PM  Result Value Ref Range   Troponin I (High Sensitivity) 90.00 (H) <18 ng/L    Comment: (NOTE) Elevated high sensitivity troponin I (hsTnI) values and significant  changes across serial measurements may  suggest ACS but many other  chronic and acute conditions are known to elevate hsTnI results.  Refer to the "Links" section for chest pain algorithms and additional  guidance. Performed at [facility], [address] [place], [place] [address]    Ct Angio Chest Pe W And/or Wo Contrast  Result Date: 11/11/2018 CLINICAL DATA:  Positive D-dimer. Cough and shortness of breath. EXAM: CT ANGIOGRAPHY CHEST WITH CONTRAST TECHNIQUE: Multidetector CT imaging of the chest was performed using the standard protocol during bolus administration of intravenous contrast. Multiplanar CT image reconstructions and MIPs were obtained to evaluate the vascular anatomy. CONTRAST:  181mL OMNIPAQUE IOHEXOL 350 MG/ML SOLN COMPARISON:  June 05, 2018 FINDINGS: Cardiovascular: Satisfactory opacification of the pulmonary arteries to the segmental level. No evidence of pulmonary embolism. Normal heart size. No pericardial effusion. Mediastinum/Nodes: No enlarged mediastinal, hilar, or axillary lymph nodes. Thyroid gland, trachea, and esophagus demonstrate no significant findings. Lungs/Pleura: Minimal atelectasis in the lingula. Lungs otherwise clear. No pleural effusion or pneumothorax. Upper Abdomen: No acute abnormality. Musculoskeletal: No chest wall abnormality. No acute or significant osseous findings. Review of the MIP images confirms the above findings. IMPRESSION: 1. No evidence of pulmonary embolus. 2. Minimal atelectasis in the lingula. Electronically Signed   By: Fidela Salisbury M.D.   On: 11/11/2018 17:43   Dg Chest Portable 1 View  Result Date: 11/11/2018 CLINICAL DATA:  Cardiac ablation.  Weakness and dizziness. EXAM: PORTABLE CHEST 1 VIEW COMPARISON:   October 21, 2018 FINDINGS: The heart size and mediastinal contours are within normal limits. Both lungs are clear. The visualized skeletal structures are unremarkable. IMPRESSION: No active disease. Electronically Signed   By: Dorise Bullion III M.D   On: 11/11/2018 16:34    Pending Labs Unresulted Labs (From admission, onward)    Start     Ordered   11/11/18 1954  SARS Coronavirus 2 (CEPHEID - Performed in [place] hospital lab), [place]  (Asymptomatic Patients Labs)  Once,   STAT    Question:  Rule Out  Answer:  Yes   11/11/18 1953          Vitals/Pain Today's Vitals   11/11/18 2005 11/11/18 2006 11/11/18 2030 11/11/18 2045  BP: 91/71  98/70   Pulse:      Resp:  19  (!) 21  Temp:      TempSrc:      SpO2:      Weight:      Height:      PainSc:        Isolation Precautions No active isolations  Medications Medications  acetaminophen (TYLENOL) tablet 650 mg (has no administration in time range)  prochlorperazine (COMPAZINE) injection 10 mg (has no administration in time range)  0.9 % NaCl with KCl 40 mEq / L  infusion (has no administration in time range)  magnesium sulfate IVPB 2 g 50 mL (has no administration in time range)  lactated ringers bolus 1,000 mL (0 mLs Intravenous Stopped 11/11/18 2010)  ketorolac (TORADOL) 15 MG/ML injection 15 mg (15 mg Intravenous Given 11/11/18 1643)  iohexol (OMNIPAQUE) 350 MG/ML injection 100 mL (100 mLs Intravenous Contrast Given 11/11/18 1721)  aspirin chewable tablet 324 mg (324 mg Oral Given 11/11/18 1736)    Mobility walks Low fall risk   Focused Assessments Cardiac Assessment Handoff:    Lab Results  Component Value Date   TROPONINI <0.03 10/22/2018   Lab Results  Component Value Date   DDIMER 1.02 (H) 11/11/2018   Does the  Patient currently have chest pain? No     R Recommendations: See Admitting Provider Note  Report given to:   Additional Notes:  Had ablation on 7/8 however chest still feels "weird"

## 2018-11-11 NOTE — Treatment Plan (Signed)
Cardiology Treatment  Plan  I was called to discuss this patient with the [facility] ED physician.  Jessica Sanchez is a 33 year old lady with a history of AVNRT who underwent ablation two days ago. This procedure was uncomplicated.  She presented to the ED today with weakness/dizziness and some mild nonspecific chest discomfort that is felt to be atypical in nature. Initial ECG showed what appears to be sinus with first degree AV block (vs ectopic atrial rhythm) with nonspecific ST changes. Second ECG appears to be NSR with nonspecific T wave changes. Hs-Tn was 95 and then 90. CTA showed no PE.  Discussed that mild troponin elevation and nonspecific chest discomfort are common following ablation procedures. This pain can be treated symptomatically with oral medications. Otherwise, it is reasonable to monitor the patient overnight and repeat echocardiogram in the AM prior to discharge.

## 2018-11-11 NOTE — ED Notes (Signed)
Pt calling family to bring food

## 2018-11-11 NOTE — ED Notes (Signed)
Pt given water 

## 2018-11-11 NOTE — ED Notes (Signed)
Attempt to call report x1 rn will call back  

## 2018-11-11 NOTE — ED Triage Notes (Signed)
Had cardiac ablation done on Wednesday.  States h/r has been up to 130's today.  Reports feeling weak and dizzy.

## 2018-11-11 NOTE — ED Notes (Signed)
Family not willing to bring her food

## 2018-11-11 NOTE — H&P (Signed)
History and Physical    Jessica Sanchez LFY:[id number] DOB: 17-Nov-1985 DOA: 11/11/2018  PCP: Lucia Gaskins, MD   Patient coming from: Home  I have personally briefly reviewed patient's old medical records in [place]  Chief Complaint: Dizziness.  HPI: Jessica Sanchez is a 33 y.o. female with medical history significant of uterine fibroid, menorrhagia, obesity, history of SVT who underwent an ablation 3 days ago and is coming in today due to dizziness, palpitations and weakness.  She also complains of pleuritic chest soreness after the procedure, but states her palpitations have increased and that she has a pressure-like sensation on her chest, which is not radiated.  She has some diaphoresis earlier and complains of having nausea after being administered the IV contrast for CTA chest.  She denies PND, orthopnea or lower extremity edema.  She feels a little anxious about this and has had some trouble sleeping because of postprocedural chest discomfort.  She also mentions having an episode of loose stools earlier today.  She denies abdominal pain, emesis, constipation, melena or hematochezia.  No dysuria, frequency or hematuria.  No polyuria, polydipsia, polyphagia or blurred vision.  ED Course: Initial vital signs temperature 98.5 F, pulse 105, respiration 18, blood pressure 115/85 mmHg and O2 sat 97% on room air.  She received a 1 L of LR bolus and 50 mg of Toradol IVP.  Her CBC was normal.  D-dimer was 1.02.  BMP shows a CO2 of 21 mmol/L, but is otherwise unremarkable.  Magnesium was 2.0 mg/dL.  Nasopharyngeal swab for SARS coronavirus 2 was negative.  Urine pregnancy test was negative.  EKG initially showed sinus or ectopic atrial tachycardia at 111 bpm with prolonged PR segment.  Repeat EKG shows sinus rhythm at 95 bpm troponin was 95 and then 99 mg/L.  Imaging: chest radiograph was negative.  CTA chest showed atelectasis of the lingula, but no pulmonary emboli or any other  abnormalities.  Review of Systems: As per HPI otherwise 10 point review of systems negative.  Past Medical History:  Diagnosis Date  . Fibroid (bleeding) (uterine)   . Fibroid, uterine   . H/O metrorrhagia   . IUD (intrauterine device) in place 07/11/2015  . Menorrhagia   . Obesity, Class III, BMI 40-49.9 (morbid obesity) ([place]) 11/11/2018  . SVT (supraventricular tachycardia) (HCC)     Past Surgical History:  Procedure Laterality Date  . HYSTEROSCOPY W/D&C N/A 09/27/2013   Procedure: DILATATION AND CURETTAGE /HYSTEROSCOPY and insertion of Mirena IUD ;  Surgeon: [name]. Harrington Challenger, MD;  Location: [place] [facility];  Service: Gynecology;  Laterality: N/A;  . MYOMECTOMY  02/03/2011   Procedure: MYOMECTOMY;  Surgeon: [name], MD;  Location: [facility];  Service: Gynecology;  Laterality: N/A;  . SVT ABLATION N/A 11/08/2018   Procedure: SVT ABLATION;  Surgeon: [name], MD;  Location: [place] [facility];  Service: Cardiovascular;  Laterality: N/A;  . TONSILLECTOMY       reports that she has never smoked. She has never used smokeless tobacco. She reports current alcohol use. She reports that she does not use drugs.  Allergies  Allergen Reactions  . Shellfish Allergy Shortness Of Breath, Itching and Swelling    Mouth became swollen, throat started itching, and developed shortness of breath  . Adhesive [Tape] Itching    Depends on the type of medical tape    Family History  Problem Relation Age of Onset  . Cirrhosis Mother   . Diabetes Mother   .  Cancer Maternal Grandfather   . Hypertension Sister   . Diabetes Sister   . Anesthesia problems Neg Hx   . Hypotension Neg Hx   . Malignant hyperthermia Neg Hx   . Pseudochol deficiency Neg Hx    Prior to Admission medications   Medication Sig Start Date End Date Taking? Authorizing Provider  albuterol (PROVENTIL HFA) 108 (90 Base) MCG/ACT inhaler Inhale 1-2 puffs into the lungs every 6 (six) hours as needed for wheezing or shortness of breath.     Yes [provider]  ibuprofen (ADVIL,MOTRIN) 600 MG tablet Take 1 tablet (600 mg total) by mouth every 6 (six) hours as needed. Patient taking differently: Take 600 mg by mouth every 6 (six) hours as needed for moderate pain.  05/06/18  Yes Idol, Almyra Free, PA-C  Multiple Vitamins-Calcium (ONE-A-DAY WOMENS PO) Take 1 tablet by mouth daily.   Yes [provider]  diltiazem (CARDIZEM CD) 180 MG 24 hr capsule Take 1 capsule (180 mg total) by mouth daily for 21 days. 10/09/18 10/31/18  Langston Masker B, PA-C  levonorgestrel (MIRENA) 20 MCG/24HR IUD 1 each by Intrauterine route once. EVERY 5 YEARS    [provider]    Physical Exam: Vitals:   11/11/18 2030 11/11/18 2045 11/11/18 2100 11/11/18 2130  BP: 98/70  (!) 84/55 105/71  Pulse:      Resp:  (!) 21 (!) 24 20  Temp:      TempSrc:      SpO2:    98%  Weight:      Height:        Constitutional: NAD, calm, comfortable Eyes: PERRL, lids and conjunctivae normal ENMT: Mucous membranes are moist. Posterior pharynx clear of any exudate or lesions. Neck: normal, supple, no masses, no thyromegaly Respiratory: Decreased breath sounds in bases, otherwise clear to auscultation bilaterally, no wheezing, no crackles. Normal respiratory effort. No accessory muscle use.  Cardiovascular: Regular rate and rhythm, no murmurs / rubs / gallops. No extremity edema. 2+ pedal pulses. No carotid bruits.  Abdomen: Obese, soft, no tenderness, no masses palpated. No hepatosplenomegaly. Bowel sounds positive.  Musculoskeletal: Postprocedural soreness on palpation of the right inguinal area.  No clubbing / cyanosis. Good ROM, no contractures. Normal muscle tone.  Skin: no rashes, lesions, ulcers on limited examination. Neurologic: CN 2-12 grossly intact. Sensation intact, DTR normal. Strength 5/5 in all 4.  Psychiatric: Normal judgment and insight. Alert and oriented x 3. Normal mood.   Labs on Admission: I have personally reviewed following  labs and imaging studies  CBC: Recent Labs  Lab 11/11/18 1636  WBC 7.0  NEUTROABS 3.3  HGB 13.7  HCT 42.8  MCV 85.8  PLT 678   Basic Metabolic Panel: Recent Labs  Lab 11/11/18 1636  NA 136  K 3.7  CL 107  CO2 21*  GLUCOSE 92  BUN 13  CREATININE 0.71  CALCIUM 9.0  MG 2.0   GFR: Estimated Creatinine Clearance: 124 mL/min (by C-G formula based on SCr of 0.71 mg/dL). Liver Function Tests: No results for input(s): AST, ALT, ALKPHOS, BILITOT, PROT, ALBUMIN in the last 168 hours. No results for input(s): LIPASE, AMYLASE in the last 168 hours. No results for input(s): AMMONIA in the last 168 hours. Coagulation Profile: No results for input(s): INR, PROTIME in the last 168 hours. Cardiac Enzymes: No results for input(s): CKTOTAL, CKMB, CKMBINDEX, TROPONINI in the last 168 hours. BNP (last 3 results) No results for input(s): PROBNP in the last 8760 hours. HbA1C: No results  for input(s): HGBA1C in the last 72 hours. CBG: Recent Labs  Lab 11/09/18 0726 11/11/18 1642  GLUCAP 84 83   Lipid Profile: No results for input(s): CHOL, HDL, LDLCALC, TRIG, CHOLHDL, LDLDIRECT in the last 72 hours. Thyroid Function Tests: No results for input(s): TSH, T4TOTAL, FREET4, T3FREE, THYROIDAB in the last 72 hours. Anemia Panel: No results for input(s): VITAMINB12, FOLATE, FERRITIN, TIBC, IRON, RETICCTPCT in the last 72 hours. Urine analysis:    Component Value Date/Time   COLORURINE YELLOW 05/26/2018 2100   APPEARANCEUR CLEAR 05/26/2018 2100   LABSPEC 1.025 05/26/2018 2100   PHURINE 6.0 05/26/2018 2100   GLUCOSEU NEGATIVE 05/26/2018 2100   HGBUR MODERATE (A) 05/26/2018 2100   [place] NEGATIVE 05/26/2018 2100   [place] NEGATIVE 05/26/2018 2100   PROTEINUR NEGATIVE 05/26/2018 2100   UROBILINOGEN 0.2 11/05/2014 2200   NITRITE NEGATIVE 05/26/2018 2100   LEUKOCYTESUR TRACE (A) 05/26/2018 2100    Radiological Exams on Admission: Ct Angio Chest Pe W And/or Wo Contrast   Result Date: 11/11/2018 CLINICAL DATA:  Positive D-dimer. Cough and shortness of breath. EXAM: CT ANGIOGRAPHY CHEST WITH CONTRAST TECHNIQUE: Multidetector CT imaging of the chest was performed using the standard protocol during bolus administration of intravenous contrast. Multiplanar CT image reconstructions and MIPs were obtained to evaluate the vascular anatomy. CONTRAST:  163mL OMNIPAQUE IOHEXOL 350 MG/ML SOLN COMPARISON:  June 05, 2018 FINDINGS: Cardiovascular: Satisfactory opacification of the pulmonary arteries to the segmental level. No evidence of pulmonary embolism. Normal heart size. No pericardial effusion. Mediastinum/Nodes: No enlarged mediastinal, hilar, or axillary lymph nodes. Thyroid gland, trachea, and esophagus demonstrate no significant findings. Lungs/Pleura: Minimal atelectasis in the lingula. Lungs otherwise clear. No pleural effusion or pneumothorax. Upper Abdomen: No acute abnormality. Musculoskeletal: No chest wall abnormality. No acute or significant osseous findings. Review of the MIP images confirms the above findings. IMPRESSION: 1. No evidence of pulmonary embolus. 2. Minimal atelectasis in the lingula. Electronically Signed   By: Fidela Salisbury M.D.   On: 11/11/2018 17:43   Dg Chest Portable 1 View  Result Date: 11/11/2018 CLINICAL DATA:  Cardiac ablation.  Weakness and dizziness. EXAM: PORTABLE CHEST 1 VIEW COMPARISON:  October 21, 2018 FINDINGS: The heart size and mediastinal contours are within normal limits. Both lungs are clear. The visualized skeletal structures are unremarkable. IMPRESSION: No active disease. Electronically Signed   By: Dorise Bullion III M.D   On: 11/11/2018 16:34   06/12/2018 echocardiogram  IMPRESSIONS   1. The left ventricle has normal systolic function of 68-03%. The cavity size was normal. There is no increased left ventricular wall thickness. Echo evidence of normal diastolic relaxation.  2. The right ventricle has normal systolic  function. The cavity was normal. There is no increase in right ventricular wall thickness.  3. The mitral valve is normal in structure. There is mild thickening. No evidence of mitral valve stenosis.  4. The tricuspid valve is normal in structure.  5. The aortic valve is tricuspid.  6. The aortic root is normal in size and structure.   11/08/2018 SVT ABLATION   CONCLUSIONS:  1. Sinus rhythm upon presentation.  2. The patient had dual AV nodal physiology with easily inducible classic AV nodal reentrant tachycardia, there were no other accessory pathways or arrhythmias induced  3. Successful radiofrequency modification of the slow AV nodal pathway  4. No inducible arrhythmias following ablation.  5. No early apparent complications.   Gregg Taylor,M.D. 11/08/2018 6:12 PM     EKG: Independently reviewed.  Vent.  rate 111 BPM PR interval * ms QRS duration 82 ms QT/QTc 247/336 ms P-R-T axes 151 38 51 Sinus or ectopic atrial tachycardia Prolonged PR interval  Vent. rate 96 BPM PR interval * ms QRS duration 95 ms QT/QTc 354/448 ms P-R-T axes 57 3 -28 Sinus rhythm Nonspecific T abnormalities, diffuse leads Unchanged from earlier.  Assessment/Plan Principal Problem:   SVT (supraventricular tachycardia) (HCC) Observation/stepdown. Supplemental oxygen as needed. Continue IV fluids. Continue Cardizem 180 mg p.o. daily. Optimize potassium and magnesium. Per cardiology, consider repeat echo in a.m.  Active Problems:   Chest pain Pain seems to be atypical. Postprocedural mild troponin elevation. Continue analgesics as needed.    Obesity, Class III, BMI 40-49.9 (morbid obesity) ([place])  Needs to work on lifestyle modifications.    Elevated d-dimer CTA chest was negative.    DVT prophylaxis: SCDs. Recent catheterization and residual soreness on right groin. Code Status: Full code. Family Communication: Disposition Plan: Overnight observation for IV hydration, heart rate  control and close monitoring. Consults called: Admission status: Observation/SDU.   Reubin Milan MD [facility]  11/11/2018, 10:00 PM   This document was prepared using Dragon voice recognition software may contain some unintended transcription errors.

## 2018-11-12 ENCOUNTER — Encounter (HOSPITAL_COMMUNITY): Payer: Self-pay

## 2018-11-12 ENCOUNTER — Observation Stay (HOSPITAL_BASED_OUTPATIENT_CLINIC_OR_DEPARTMENT_OTHER): Payer: No Typology Code available for payment source

## 2018-11-12 DIAGNOSIS — R079 Chest pain, unspecified: Secondary | ICD-10-CM | POA: Diagnosis not present

## 2018-11-12 DIAGNOSIS — R7989 Other specified abnormal findings of blood chemistry: Secondary | ICD-10-CM

## 2018-11-12 DIAGNOSIS — I471 Supraventricular tachycardia: Secondary | ICD-10-CM | POA: Diagnosis not present

## 2018-11-12 DIAGNOSIS — I10 Essential (primary) hypertension: Secondary | ICD-10-CM

## 2018-11-12 DIAGNOSIS — K219 Gastro-esophageal reflux disease without esophagitis: Secondary | ICD-10-CM

## 2018-11-12 LAB — TROPONIN I (HIGH SENSITIVITY)
Troponin I (High Sensitivity): 66 ng/L — ABNORMAL HIGH (ref <18)
Troponin I (High Sensitivity): 58 ng/L — ABNORMAL HIGH (ref <18)

## 2018-11-12 LAB — ECHOCARDIOGRAM COMPLETE
Height: 62 in
Weight: 4680.81 oz

## 2018-11-12 LAB — MRSA PCR SCREENING: MRSA by PCR: NEGATIVE

## 2018-11-12 MED ORDER — PANTOPRAZOLE SODIUM 40 MG PO TBEC: 40.0000 mg | DELAYED_RELEASE_TABLET | Freq: Every day | ORAL | 1 refills | Status: DC

## 2018-11-12 NOTE — Progress Notes (Signed)
  November 12, 2018  Patient: Jessica Sanchez  Date of Birth: 1986/01/22  Date of Visit: 11/11/2018    To Whom It May Concern:  Aideliz Garmany was seen and treated in our emergency department or urgent care center on 11/11/2018. HEIDEE AUDI  may return to work on October 21, 2018.  Sincerely,  Nelta Numbers, RN  [facility]  [phone]

## 2018-11-12 NOTE — Progress Notes (Signed)
*  PRELIMINARY RESULTS* Echocardiogram 2D Echocardiogram has been performed.  Leavy Cella 11/12/2018, 10:40 AM

## 2018-11-12 NOTE — Progress Notes (Signed)
Pt transferred to private vehicle via w/c. VSS upon admission. PIV removed. Tolerated well. AVS paperwork and education given on SVT and troponin levels. Work note provided given pt's recent cardio ablation. Will f/u with cardiology asap. Has appt in [place] scheduled for Aug 5.

## 2018-11-12 NOTE — Discharge Summary (Signed)
Physician Discharge Summary  Jessica Sanchez WRU:[id number] DOB: April 30, 1986 DOA: 11/11/2018  PCP: Arnoldo Lenis, MD  Admit date: 11/11/2018 Discharge date: 11/12/2018  Time spent: 35 minutes  Recommendations for Outpatient Follow-up:  1. Repeat basic metabolic panel to follow electrolytes and renal function 2. Reassess resolution of patient's symptoms while using PPI. 3. Continue assisting with weight loss management/referral to outpatient bariatric clinic.   Discharge Diagnoses:  Principal Problem:   SVT (supraventricular tachycardia) (HCC) Active Problems:   Nonspecific chest pain   Obesity, Class III, BMI 40-49.9 (morbid obesity) (HCC)   Elevated d-dimer   Gastroesophageal reflux disease Essential hypertension.  Discharge Condition: Stable and improved.  Patient discharged home with instructions to follow-up with PCP in 10 days and to follow-up with cardiologist as previously instructed.  Diet recommendation: Heart healthy and low calorie diet.  Filed Weights   11/11/18 1528 11/11/18 2200 11/12/18 0500  Weight: 121.1 kg 132.6 kg 132.7 kg    History of present illness:  33 y.o. female with medical history significant of uterine fibroid, menorrhagia, obesity, history of SVT who underwent an ablation 3 days ago and is coming in today due to dizziness, palpitations and weakness.  She also complains of pleuritic chest soreness after the procedure, but states her palpitations have increased and that she has a pressure-like sensation on her chest, which is not radiated.  She has some diaphoresis earlier and complains of having nausea after being administered the IV contrast for CTA chest.  She denies PND, orthopnea or lower extremity edema.  She feels a little anxious about this and has had some trouble sleeping because of postprocedural chest discomfort.  She also mentions having an episode of loose stools earlier today.  She denies abdominal pain, emesis, constipation, melena or  hematochezia.  No dysuria, frequency or hematuria.  No polyuria, polydipsia, polyphagia or blurred vision.  ED Course: Initial vital signs temperature 98.5 F, pulse 105, respiration 18, blood pressure 115/85 mmHg and O2 sat 97% on room air.  She received a 1 L of LR bolus and 50 mg of Toradol IVP.  Her CBC was normal.  D-dimer was 1.02.  BMP shows a CO2 of 21 mmol/L, but is otherwise unremarkable.  Magnesium was 2.0 mg/dL.  Nasopharyngeal swab for SARS coronavirus 2 was negative.  Urine pregnancy test was negative.  EKG initially showed sinus or ectopic atrial tachycardia at 111 bpm with prolonged PR segment.  Repeat EKG shows sinus rhythm at 95 bpm troponin was 95 and then 99 mg/L.  Hospital Course:  1-SVT -Status post ablation -After resumption of Cardizem 180 mg daily heart rate has remained stable, well controlled and in sinus rhythm. -Electrolytes has been repleted and within normal limits at discharge (baseline repletion and potassium). -Troponins trending down appropriately, patient reports very little discomfort in her chest at time of discharge. -No acute ischemic changes appreciated on current EKG or cardiac monitoring. -2D echo reassuring with preserved ejection fraction, no wall motion abnormalities and no significant valvular defects. -Outpatient follow-up with cardiology service as instructed.  2-atypical chest pain -In the setting of recent SVT ablation -Also with concern for gastroesophageal reflux disease -Patient has been discharged on PPI -Continue PRN pain medication.  3-obesity, class III -Body mass index is 53.51 kg/m. -Patient has been instructed to follow low calorie diet, portion control and increase physical activity.  4-elevated d-dimer -Most likely associated with recent ablation -No acute infiltrates or pulmonary embolism appreciated on CT angio of the chest.  5-essential hypertension -  Stable and well-controlled -Continue Cardizem. -Patient advised to  follow heart healthy diet.  6-mild dehydration/dizziness -Resolve after fluid resuscitation -Patient advised to maintain adequate hydration. -At discharge no nausea, no vomiting, no diarrhea.   Procedures:  See below for x-ray report  2D echo: 1. The left ventricle has normal systolic function with an ejection fraction of 60-65%. The cavity size was normal. Left ventricular diastolic parameters were normal. No evidence of left ventricular regional wall motion abnormalities.  2. The right ventricle has normal systolic function. The cavity was normal. There is no increase in right ventricular wall thickness. Right ventricular systolic pressure could not be assessed.  3. There is no evidence of pericardial effusion.  Consultations:  Cardiology was consulted over the phone by EDP (Dr. Rhae Hammock; recommendations given for observation, cycle troponin and repeat echo; if no abnormalities appreciated and troponins trending down safe to discharge home with outpatient follow-up).  Discharge Exam: Vitals:   11/12/18 1100 11/12/18 1102  BP: 114/77 114/77  Pulse: 81   Resp: 17   Temp:    SpO2: 98%     General:  Cardiovascular:  Respiratory:   Discharge Instructions   Discharge Instructions    Diet - low sodium heart healthy   Complete by: As directed    Discharge instructions   Complete by: As directed    Take medications as prescribed Follow heart healthy/low calorie diet Maintain adequate hydration Arrange follow-up with PCP in 10 days. Follow-up with cardiology service as previously instructed.     Allergies as of 11/12/2018      Reactions   Shellfish Allergy Shortness Of Breath, Itching, Swelling   Mouth became swollen, throat started itching, and developed shortness of breath   Adhesive [tape] Itching   Depends on the type of medical tape      Medication List    TAKE these medications   diltiazem 180 MG 24 hr capsule Commonly known as: CARDIZEM CD Take 1 capsule  (180 mg total) by mouth daily for 21 days.   ibuprofen 600 MG tablet Commonly known as: ADVIL Take 1 tablet (600 mg total) by mouth every 6 (six) hours as needed. What changed: reasons to take this   levonorgestrel 20 MCG/24HR IUD Commonly known as: MIRENA 1 each by Intrauterine route once. EVERY 5 YEARS   ONE-A-DAY WOMENS PO Take 1 tablet by mouth daily.   pantoprazole 40 MG tablet Commonly known as: Protonix Take 1 tablet (40 mg total) by mouth daily.   Proventil HFA 108 (90 Base) MCG/ACT inhaler Generic drug: albuterol Inhale 1-2 puffs into the lungs every 6 (six) hours as needed for wheezing or shortness of breath.      Allergies  Allergen Reactions  . Shellfish Allergy Shortness Of Breath, Itching and Swelling    Mouth became swollen, throat started itching, and developed shortness of breath  . Adhesive [Tape] Itching    Depends on the type of medical tape   Follow-up Information    Branch, Alphonse Guild, MD. Schedule an appointment as soon as possible for a visit in 2 week(s).   Specialty: Cardiology Contact information: [address] [place] [address] [phone]           The results of significant diagnostics from this hospitalization (including imaging, microbiology, ancillary and laboratory) are listed below for reference.    Significant Diagnostic Studies: Dg Chest 2 View  Result Date: 10/21/2018 CLINICAL DATA:  Chest pain and shortness of breath. EXAM: CHEST - 2 VIEW COMPARISON:  10/16/2018, multiple priors FINDINGS: Low lung volumes with bronchovascular crowding. Unchanged heart size and mediastinal contours. Slight improved right basilar opacities from prior. Probable left lung base atelectasis. No pleural fluid or pneumothorax. Soft tissue attenuation from habitus limits detailed assessment. IMPRESSION: Low lung volumes with bronchovascular crowding. Improved right basilar opacities from prior exam. Suspect residual left lung base atelectasis.  Electronically Signed   By: Keith Rake M.D.   On: 10/21/2018 23:02   Dg Chest 2 View  Result Date: 10/16/2018 CLINICAL DATA:  Chest pressure and increasing shortness of breath. EXAM: CHEST - 2 VIEW COMPARISON:  10/09/2018 FINDINGS: Cardiomediastinal silhouette is normal. Mediastinal contours appear intact. Patchy lower lobe predominant interstitial and alveolar opacities. Osseous structures are without acute abnormality. Soft tissues are grossly normal. IMPRESSION: Patchy lower lobe predominant interstitial and alveolar opacities may represent early/atypical pneumonia or atelectasis. Electronically Signed   By: Fidela Salisbury M.D.   On: 10/16/2018 19:41   Ct Angio Chest Pe W And/or Wo Contrast  Result Date: 11/11/2018 CLINICAL DATA:  Positive D-dimer. Cough and shortness of breath. EXAM: CT ANGIOGRAPHY CHEST WITH CONTRAST TECHNIQUE: Multidetector CT imaging of the chest was performed using the standard protocol during bolus administration of intravenous contrast. Multiplanar CT image reconstructions and MIPs were obtained to evaluate the vascular anatomy. CONTRAST:  124mL OMNIPAQUE IOHEXOL 350 MG/ML SOLN COMPARISON:  June 05, 2018 FINDINGS: Cardiovascular: Satisfactory opacification of the pulmonary arteries to the segmental level. No evidence of pulmonary embolism. Normal heart size. No pericardial effusion. Mediastinum/Nodes: No enlarged mediastinal, hilar, or axillary lymph nodes. Thyroid gland, trachea, and esophagus demonstrate no significant findings. Lungs/Pleura: Minimal atelectasis in the lingula. Lungs otherwise clear. No pleural effusion or pneumothorax. Upper Abdomen: No acute abnormality. Musculoskeletal: No chest wall abnormality. No acute or significant osseous findings. Review of the MIP images confirms the above findings. IMPRESSION: 1. No evidence of pulmonary embolus. 2. Minimal atelectasis in the lingula. Electronically Signed   By: Fidela Salisbury M.D.   On: 11/11/2018  17:43   Dg Chest Portable 1 View  Result Date: 11/11/2018 CLINICAL DATA:  Cardiac ablation.  Weakness and dizziness. EXAM: PORTABLE CHEST 1 VIEW COMPARISON:  October 21, 2018 FINDINGS: The heart size and mediastinal contours are within normal limits. Both lungs are clear. The visualized skeletal structures are unremarkable. IMPRESSION: No active disease. Electronically Signed   By: Dorise Bullion III M.D   On: 11/11/2018 16:34    Microbiology: Recent Results (from the past 240 hour(s))  SARS Coronavirus 2 (Performed in [facility] hospital lab)     Status: None   Collection Time: 11/06/18 10:18 AM   Specimen: Nasal Swab  Result Value Ref Range Status   SARS Coronavirus 2 NEGATIVE NEGATIVE Final    Comment: (NOTE) SARS-CoV-2 target nucleic acids are NOT DETECTED. The SARS-CoV-2 RNA is generally detectable in upper and lower respiratory specimens during the acute phase of infection. Negative results do not preclude SARS-CoV-2 infection, do not rule out co-infections with other pathogens, and should not be used as the sole basis for treatment or other patient management decisions. Negative results must be combined with clinical observations, patient history, and epidemiological information. The expected result is Negative. Fact Sheet for Patients: SugarRoll.be Fact Sheet for Healthcare Providers: https://www.woods-mathews.com/ This test is not yet approved or cleared by the [place] FDA and  has been authorized for detection and/or diagnosis of SARS-CoV-2 by FDA under an Emergency Use Authorization (EUA). This EUA will remain  in effect (meaning this test can  be used) for the duration of the COVID-19 declaration under Section 56 4(b)(1) of the Act, 21 U.S.C. section 360bbb-3(b)(1), unless the authorization is terminated or revoked sooner. Performed at [facility], [place] [address] [place], [place] [address]   SARS Coronavirus 2  (CEPHEID - Performed in [facility] hospital lab), Hosp Order     Status: None   Collection Time: 11/11/18  7:55 PM   Specimen: Nasopharyngeal Swab  Result Value Ref Range Status   SARS Coronavirus 2 NEGATIVE NEGATIVE Final    Comment: (NOTE) If result is NEGATIVE SARS-CoV-2 target nucleic acids are NOT DETECTED. The SARS-CoV-2 RNA is generally detectable in upper and lower  respiratory specimens during the acute phase of infection. The lowest  concentration of SARS-CoV-2 viral copies this assay can detect is 250  copies / mL. A negative result does not preclude SARS-CoV-2 infection  and should not be used as the sole basis for treatment or other  patient management decisions.  A negative result may occur with  improper specimen collection / handling, submission of specimen other  than nasopharyngeal swab, presence of viral mutation(s) within the  areas targeted by this assay, and inadequate number of viral copies  (<250 copies / mL). A negative result must be combined with clinical  observations, patient history, and epidemiological information. If result is POSITIVE SARS-CoV-2 target nucleic acids are DETECTED. The SARS-CoV-2 RNA is generally detectable in upper and lower  respiratory specimens dur ing the acute phase of infection.  Positive  results are indicative of active infection with SARS-CoV-2.  Clinical  correlation with patient history and other diagnostic information is  necessary to determine patient infection status.  Positive results do  not rule out bacterial infection or co-infection with other viruses. If result is PRESUMPTIVE POSTIVE SARS-CoV-2 nucleic acids MAY BE PRESENT.   A presumptive positive result was obtained on the submitted specimen  and confirmed on repeat testing.  While 2019 novel coronavirus  (SARS-CoV-2) nucleic acids may be present in the submitted sample  additional confirmatory testing may be necessary for epidemiological  and / or clinical  management purposes  to differentiate between  SARS-CoV-2 and other Sarbecovirus currently known to infect humans.  If clinically indicated additional testing with an alternate test  methodology [phone]) is advised. The SARS-CoV-2 RNA is generally  detectable in upper and lower respiratory sp ecimens during the acute  phase of infection. The expected result is Negative. Fact Sheet for Patients:  StrictlyIdeas.no Fact Sheet for Healthcare Providers: BankingDealers.co.za This test is not yet approved or cleared by the [place] FDA and has been authorized for detection and/or diagnosis of SARS-CoV-2 by FDA under an Emergency Use Authorization (EUA).  This EUA will remain in effect (meaning this test can be used) for the duration of the COVID-19 declaration under Section 564(b)(1) of the Act, 21 U.S.C. section 360bbb-3(b)(1), unless the authorization is terminated or revoked sooner. Performed at [facility], [address], [place], [place] [address]   MRSA PCR Screening     Status: None   Collection Time: 11/11/18  9:57 PM   Specimen: Nasal Mucosa; Nasopharyngeal  Result Value Ref Range Status   MRSA by PCR NEGATIVE NEGATIVE Final    Comment:        The GeneXpert MRSA Assay (FDA approved for NASAL specimens only), is one component of a comprehensive MRSA colonization surveillance program. It is not intended to diagnose MRSA infection nor to guide or monitor treatment for MRSA infections. Performed  at [facility], [address], [place],  [address]      Labs: Basic Metabolic Panel: Recent Labs  Lab 11/11/18 1636  NA 136  K 3.7  CL 107  CO2 21*  GLUCOSE 92  BUN 13  CREATININE 0.71  CALCIUM 9.0  MG 2.0   CBC: Recent Labs  Lab 11/11/18 1636  WBC 7.0  NEUTROABS 3.3  HGB 13.7  HCT 42.8  MCV 85.8  PLT 290    CBG: Recent Labs  Lab 11/09/18 0726 11/11/18 1642  GLUCAP 84 83     Signed:  Barton Dubois MD.  [facility] 11/12/2018, 2:57 PM

## 2018-11-16 NOTE — Telephone Encounter (Signed)
Symptoms may purely be due to anxiety which is driving up BP. She was already evaluated in the hospital recently. Echo was normal. She may need to speak with PCP about anxiolytics.

## 2018-11-16 NOTE — Telephone Encounter (Signed)
Patient called to say she feels like an elephant is sitting on her chest, her BP is 145/108, HR 99. She was just seen in the ED on 11/12/18 and states she was observed over nite because of elevated troponins. She is very anxious and wants to go to the ED for evaluation.

## 2018-11-29 ENCOUNTER — Telehealth: Payer: Self-pay | Admitting: Adult Health

## 2018-11-29 NOTE — Telephone Encounter (Signed)
Mail box full @ 4:48 pm. Unable to leave message. If this is a screening mammogram, pt can schedule her own appt. If she has felt something, pt will need an appt. Jessica Sanchez

## 2018-11-29 NOTE — Telephone Encounter (Signed)
Pt is needing an order to have a mammogram placed. Please call pt to let her know the order has been placed.

## 2018-11-30 NOTE — Telephone Encounter (Signed)
Pt aware of message below

## 2018-12-01 ENCOUNTER — Encounter (HOSPITAL_COMMUNITY): Payer: Self-pay | Admitting: Emergency Medicine

## 2018-12-01 ENCOUNTER — Emergency Department (HOSPITAL_COMMUNITY): Payer: No Typology Code available for payment source

## 2018-12-01 ENCOUNTER — Other Ambulatory Visit: Payer: Self-pay

## 2018-12-01 ENCOUNTER — Emergency Department (HOSPITAL_COMMUNITY)
Admission: EM | Admit: 2018-12-01 | Discharge: 2018-12-01 | Disposition: A | Discharge status: Home / Self Care | Payer: No Typology Code available for payment source | Attending: Emergency Medicine | Admitting: Emergency Medicine

## 2018-12-01 ENCOUNTER — Telehealth: Payer: Self-pay | Admitting: Cardiovascular Disease

## 2018-12-01 DIAGNOSIS — I471 Supraventricular tachycardia: Secondary | ICD-10-CM | POA: Insufficient documentation

## 2018-12-01 DIAGNOSIS — R079 Chest pain, unspecified: Secondary | ICD-10-CM | POA: Diagnosis not present

## 2018-12-01 DIAGNOSIS — R072 Precordial pain: Secondary | ICD-10-CM

## 2018-12-01 LAB — BASIC METABOLIC PANEL
Anion gap: 7 (ref 5–15)
BUN: 12 mg/dL (ref 6–20)
CO2: 23 mmol/L (ref 22–32)
Calcium: 8.6 mg/dL — ABNORMAL LOW (ref 8.9–10.3)
Chloride: 107 mmol/L (ref 98–111)
Creatinine, Ser: 0.61 mg/dL (ref 0.44–1.00)
GFR calc Af Amer: >60 mL/min (ref >60)
GFR calc non Af Amer: >60 mL/min (ref >60)
Glucose, Bld: 85 mg/dL (ref 70–99)
Potassium: 3.9 mmol/L (ref 3.5–5.1)
Sodium: 137 mmol/L (ref 135–145)

## 2018-12-01 LAB — TROPONIN I (HIGH SENSITIVITY)
Troponin I (High Sensitivity): <2 ng/L (ref <18)
Troponin I (High Sensitivity): <2 ng/L (ref <18)

## 2018-12-01 LAB — CBC
HCT: 38.9 % (ref 36.0–46.0)
Hemoglobin: 12 g/dL (ref 12.0–15.0)
MCH: 27.1 pg (ref 26.0–34.0)
MCHC: 30.8 g/dL (ref 30.0–36.0)
MCV: 88 fL (ref 80.0–100.0)
Platelets: 264 10*3/uL (ref 150–400)
RBC: 4.42 MIL/uL (ref 3.87–5.11)
RDW: 13.9 % (ref 11.5–15.5)
WBC: 5 10*3/uL (ref 4.0–10.5)
nRBC: 0 % (ref 0.0–0.2)

## 2018-12-01 LAB — POC URINE PREG, ED: Preg Test, Ur: NEGATIVE

## 2018-12-01 MED ORDER — FAMOTIDINE 20 MG PO TABS: 20.0000 mg | ORAL_TABLET | Freq: Two times a day (BID) | ORAL | 0 refills | Status: DC

## 2018-12-01 MED ORDER — ALUM & MAG HYDROXIDE-SIMETH 200-200-20 MG/5ML PO SUSP
30.0000 mL | Freq: Once | ORAL | Status: AC
  Administered 2018-12-01: 13:00:00 30 mL via ORAL
  Filled 2018-12-01: qty 30

## 2018-12-01 MED ORDER — FAMOTIDINE IN NACL 20-0.9 MG/50ML-% IV SOLN
20.0000 mg | Freq: Once | INTRAVENOUS | Status: AC
  Administered 2018-12-01: 14:00:00 20 mg via INTRAVENOUS
  Filled 2018-12-01: qty 50

## 2018-12-01 MED ORDER — LIDOCAINE VISCOUS HCL 2 % MT SOLN
15.0000 mL | Freq: Once | OROMUCOSAL | Status: AC
  Administered 2018-12-01: 15 mL via ORAL
  Filled 2018-12-01: qty 15

## 2018-12-01 MED ORDER — SODIUM CHLORIDE 0.9% FLUSH: 3.0000 mL | Freq: Once | INTRAVENOUS | Status: DC

## 2018-12-01 NOTE — Telephone Encounter (Signed)
Patients states she feels like an "elephant is sitting on her chest and her left arm right now."Her Aunt is going to take her to the ED      I will FYI Dr Bronson Ing

## 2018-12-01 NOTE — Discharge Instructions (Addendum)
Follow-up with cardiology as scheduled for next week.  Heart markers here today x2 both very normal.  Feel pain is not heart related.  Take the Pepcid as directed for the next 2 weeks.  Make an appointment follow-up with your regular doctor.  Work note provided.  Return for any new or worse symptoms.

## 2018-12-01 NOTE — ED Notes (Signed)
Unable to establish IV- waiting on a certified nurse to try with ultrasound.

## 2018-12-01 NOTE — ED Provider Notes (Signed)
Patient with onset of substernal chest pain constant since 3 this morning.  Improved some with GI cocktail and Pepcid.  Both troponins less than 2.  Feel the pain is not cardiac related.  Patient has follow-up with her cardiologist for next week.  Patient's had an ablation for SVT.  Patient with no known coronary artery disease.  We will continue Pepcid.  Patient will return for any new or worse symptoms.   Fredia Sorrow, MD 12/01/18 [phone]

## 2018-12-01 NOTE — ED Provider Notes (Signed)
[facility] Emergency Department Provider Note MRN:  [id number]  Arrival date & time: 12/01/18     Chief Complaint   Chest Pain   History of Present Illness   Jessica Sanchez is a 33 y.o. year-old female with a history of SVT, obesity presenting to the ED with chief complaint of chest pain.  Patient returned from work late last night with some lightheadedness, went to sleep.  Woke up at 3 AM with central chest pain.  Described as a sharpness or tightness.  Improved with sitting up.  Associated with some diaphoresis, some shortness of breath.  Pain is currently 10 out of 10.  She denies headache or vision change, no abdominal pain, no recent pain or swelling to the legs.  Has had recent admissions to the hospital for abnormal heart rhythms.  Review of Systems  A complete 10 system review of systems was obtained and all systems are negative except as noted in the HPI and PMH.   Patient's Health History    Past Medical History:  Diagnosis Date  . Fibroid (bleeding) (uterine)   . Fibroid, uterine   . H/O metrorrhagia   . IUD (intrauterine device) in place 07/11/2015  . Menorrhagia   . Obesity, Class III, BMI 40-49.9 (morbid obesity) ([place]) 11/11/2018  . SVT (supraventricular tachycardia) (HCC)     Past Surgical History:  Procedure Laterality Date  . HYSTEROSCOPY W/D&C N/A 09/27/2013   Procedure: DILATATION AND CURETTAGE /HYSTEROSCOPY and insertion of Mirena IUD ;  Surgeon: [name]. Harrington Challenger, MD;  Location: [facility];  Service: Gynecology;  Laterality: N/A;  . MYOMECTOMY  02/03/2011   Procedure: MYOMECTOMY;  Surgeon: [name], MD;  Location: [facility];  Service: Gynecology;  Laterality: N/A;  . SVT ABLATION N/A 11/08/2018   Procedure: SVT ABLATION;  Surgeon: [name], MD;  Location: [facility];  Service: Cardiovascular;  Laterality: N/A;  . TONSILLECTOMY      Family History  Problem Relation Age of Onset  . Cirrhosis Mother   . Diabetes Mother   . Cancer  Maternal Grandfather   . Hypertension Sister   . Diabetes Sister   . Anesthesia problems Neg Hx   . Hypotension Neg Hx   . Malignant hyperthermia Neg Hx   . Pseudochol deficiency Neg Hx     Social History   Socioeconomic History  . Marital status: Single    Spouse name: Not on file  . Number of children: Not on file  . Years of education: Not on file  . Highest education level: Not on file  Occupational History  . Not on file  Social Needs  . Financial resource strain: Not on file  . Food insecurity    Worry: Not on file    Inability: Not on file  . Transportation needs    Medical: Not on file    Non-medical: Not on file  Tobacco Use  . Smoking status: Never Smoker  . Smokeless tobacco: Never Used  Substance and Sexual Activity  . Alcohol use: Yes    Comment: occasional  . Drug use: No  . Sexual activity: Yes    Birth control/protection: I.U.D.  Lifestyle  . Physical activity    Days per week: Not on file    Minutes per session: Not on file  . Stress: Not on file  Relationships  . Social Herbalist on phone: Not on file    Gets together: Not on file  Attends religious service: Not on file    Active member of club or organization: Not on file    Attends meetings of clubs or organizations: Not on file    Relationship status: Not on file  . Intimate partner violence    Fear of current or ex partner: Not on file    Emotionally abused: Not on file    Physically abused: Not on file    Forced sexual activity: Not on file  Other Topics Concern  . Not on file  Social History Narrative  . Not on file     Physical Exam  Vital Signs and Nursing Notes reviewed Vitals:   12/01/18 1203 12/01/18 1330  BP: (!) 121/59 119/82  Pulse: 71 71  Resp: 19 (!) 24  SpO2: 97% 100%    CONSTITUTIONAL: Well-appearing, NAD NEURO:  Alert and oriented x 3, no focal deficits EYES:  eyes equal and reactive ENT/NECK:  no LAD, no JVD CARDIO: Regular rate, well-perfused,  normal S1 and S2 PULM:  CTAB no wheezing or rhonchi GI/GU:  normal bowel sounds, non-distended, non-tender MSK/SPINE:  No gross deformities, no edema SKIN:  no rash, atraumatic PSYCH:  Appropriate speech and behavior  Diagnostic and Interventional Summary    EKG Interpretation  Date/Time:  Friday December 01 2018 12:00:26 EDT Ventricular Rate:  72 PR Interval:    QRS Duration: 92 QT Interval:  383 QTC Calculation: 420 R Axis:   20 Text Interpretation:  Sinus rhythm Borderline T abnormalities, anterior leads Confirmed by Gerlene Fee [phone]) on 12/01/2018 12:20:11 PM      Labs Reviewed  BASIC METABOLIC PANEL - Abnormal; Notable for the following components:      Result Value   Calcium 8.6 (*)    All other components within normal limits  CBC  POC URINE PREG, ED  TROPONIN I (HIGH SENSITIVITY)  TROPONIN I (HIGH SENSITIVITY)    DG Chest 2 View  Final Result      Medications  sodium chloride flush (NS) 0.9 % injection 3 mL (3 mLs Intravenous Not Given 12/01/18 1218)  alum & mag hydroxide-simeth (MAALOX/MYLANTA) 200-200-20 MG/5ML suspension 30 mL (30 mLs Oral Given 12/01/18 1309)    And  lidocaine (XYLOCAINE) 2 % viscous mouth solution 15 mL (15 mLs Oral Given 12/01/18 1309)  famotidine (PEPCID) IVPB 20 mg premix (0 mg Intravenous Stopped 12/01/18 1410)     Procedures Critical Care  ED Course and Medical Decision Making  I have reviewed the triage vital signs and the nursing notes.  Pertinent labs & imaging results that were available during my care of the patient were reviewed by me and considered in my medical decision making (see below for details).  Upon chart review it appears that patient had an ablation for SVT which required admission.  Shortly after she had a repeat admission for chest pain and dizziness in the setting of elevated troponins, that cardiology felt was most likely related to the recent ablation.  She also had an elevated d-dimer at that time with a CTA  chest that revealed no pulmonary embolism.  She presents today with recurrence of chest pain.  She is 33 years old and has risk factors that include obesity and a reported family history of "heart problems" and her mother died in her early 68s.  Otherwise she does not smoke, does not have diabetes, does not have known coronary artery disease.  Her pain seems to be improved with sitting up, suggesting possible GI etiology.  Her EKG  is without acute concerns, will obtain troponins and trial GI cocktail.  First troponin is negative, patient continues to have pain but only minutes after GI cocktail, reassess after Pepcid and second troponin.  Anticipating discharge.  Signed out to oncoming provider at shift change.  Barth Kirks. Sedonia Small, [place] [email or web address]  Final Clinical Impressions(s) / ED Diagnoses     ICD-10-CM   1. Chest pain, unspecified type  R07.9     ED Discharge Orders    None         Maudie Flakes, MD 12/01/18 1515

## 2018-12-01 NOTE — ED Triage Notes (Signed)
Patient states she was awakened by central chest pain that feels like pressure this am at 0300. She c/o not feeling well starting yesterday. Patient states she became nauseated and SOB. Pain has become worse and has been constant. Patient had ICU admission 2 weeks ago, ablation on 7/8.

## 2018-12-01 NOTE — Telephone Encounter (Signed)
New message    Pt c/o of Chest Pain: STAT if CP now or developed within 24 hours  1. Are you having CP right now? yes  2. Are you experiencing any other symptoms (ex. SOB, nausea, vomiting, sweating)? Dizziness, numbness iin left arm , feel like elephant sitting on chest   3. How long have you been experiencing CP? 230a   4. Is your CP continuous or coming and going? Continuous    5. Have you taken Nitroglycerin? hasnt taken anything  ?

## 2018-12-06 ENCOUNTER — Other Ambulatory Visit: Payer: Self-pay

## 2018-12-06 ENCOUNTER — Encounter: Payer: Self-pay | Admitting: Internal Medicine

## 2018-12-06 ENCOUNTER — Ambulatory Visit (INDEPENDENT_AMBULATORY_CARE_PROVIDER_SITE_OTHER): Payer: No Typology Code available for payment source | Admitting: Internal Medicine

## 2018-12-06 VITALS — BP 110/72 | HR 72 | Ht 62.0 in | Wt 290.0 lb

## 2018-12-06 DIAGNOSIS — R5383 Other fatigue: Secondary | ICD-10-CM

## 2018-12-06 DIAGNOSIS — R400 Somnolence: Secondary | ICD-10-CM | POA: Diagnosis not present

## 2018-12-06 DIAGNOSIS — I471 Supraventricular tachycardia: Secondary | ICD-10-CM | POA: Diagnosis not present

## 2018-12-06 DIAGNOSIS — R0683 Snoring: Secondary | ICD-10-CM | POA: Diagnosis not present

## 2018-12-06 NOTE — Progress Notes (Signed)
HPI Jessica Sanchez returns today for followup. She is a pleasant 33 yo woman with a h/o morbid obesity, SVT, s/p ablation. Since DC from the hospital she has returned twice for chest pain. No obvious etiologies. She has had HR's in the 100's. She notes daytime fatigue and a h/o loud snoring. Allergies  Allergen Reactions  . Shellfish Allergy Shortness Of Breath, Itching and Swelling    Mouth became swollen, throat started itching, and developed shortness of breath  . Adhesive [Tape] Itching    Depends on the type of medical tape     Current Outpatient Medications  Medication Sig Dispense Refill  . albuterol (PROVENTIL HFA) 108 (90 Base) MCG/ACT inhaler Inhale 1-2 puffs into the lungs every 6 (six) hours as needed for wheezing or shortness of breath.     . famotidine (PEPCID) 20 MG tablet Take 1 tablet (20 mg total) by mouth 2 (two) times daily. 30 tablet 0  . ibuprofen (ADVIL,MOTRIN) 600 MG tablet Take 1 tablet (600 mg total) by mouth every 6 (six) hours as needed. (Patient taking differently: Take 600 mg by mouth every 6 (six) hours as needed for moderate pain. ) 20 tablet 0  . levonorgestrel (MIRENA) 20 MCG/24HR IUD 1 each by Intrauterine route once. EVERY 5 YEARS    . Multiple Vitamins-Calcium (ONE-A-DAY WOMENS PO) Take 1 tablet by mouth daily.    . pantoprazole (PROTONIX) 40 MG tablet Take 1 tablet (40 mg total) by mouth daily. 30 tablet 1  . diltiazem (CARDIZEM CD) 180 MG 24 hr capsule Take 1 capsule (180 mg total) by mouth daily for 21 days. 21 capsule 0   No current facility-administered medications for this visit.      Past Medical History:  Diagnosis Date  . Fibroid (bleeding) (uterine)   . Fibroid, uterine   . H/O metrorrhagia   . IUD (intrauterine device) in place 07/11/2015  . Menorrhagia   . Obesity, Class III, BMI 40-49.9 (morbid obesity) ([place]) 11/11/2018  . SVT (supraventricular tachycardia) (HCC)     ROS:   All systems reviewed and negative except as noted in  the HPI.   Past Surgical History:  Procedure Laterality Date  . HYSTEROSCOPY W/D&C N/A 09/27/2013   Procedure: DILATATION AND CURETTAGE /HYSTEROSCOPY and insertion of Mirena IUD ;  Surgeon: [name]. Harrington Challenger, MD;  Location: [facility];  Service: Gynecology;  Laterality: N/A;  . MYOMECTOMY  02/03/2011   Procedure: MYOMECTOMY;  Surgeon: [name], MD;  Location: [facility];  Service: Gynecology;  Laterality: N/A;  . SVT ABLATION N/A 11/08/2018   Procedure: SVT ABLATION;  Surgeon: [name], MD;  Location: [facility];  Service: Cardiovascular;  Laterality: N/A;  . TONSILLECTOMY       Family History  Problem Relation Age of Onset  . Cirrhosis Mother   . Diabetes Mother   . Cancer Maternal Grandfather   . Hypertension Sister   . Diabetes Sister   . Anesthesia problems Neg Hx   . Hypotension Neg Hx   . Malignant hyperthermia Neg Hx   . Pseudochol deficiency Neg Hx      Social History   Socioeconomic History  . Marital status: Single    Spouse name: Not on file  . Number of children: Not on file  . Years of education: Not on file  . Highest education level: Not on file  Occupational History  . Not on file  Social Needs  . Financial resource strain: Not  on file  . Food insecurity    Worry: Not on file    Inability: Not on file  . Transportation needs    Medical: Not on file    Non-medical: Not on file  Tobacco Use  . Smoking status: Never Smoker  . Smokeless tobacco: Never Used  Substance and Sexual Activity  . Alcohol use: Yes    Comment: occasional  . Drug use: No  . Sexual activity: Yes    Birth control/protection: I.U.D.  Lifestyle  . Physical activity    Days per week: Not on file    Minutes per session: Not on file  . Stress: Not on file  Relationships  . Social Herbalist on phone: Not on file    Gets together: Not on file    Attends religious service: Not on file    Active member of club or organization: Not on file    Attends meetings  of clubs or organizations: Not on file    Relationship status: Not on file  . Intimate partner violence    Fear of current or ex partner: Not on file    Emotionally abused: Not on file    Physically abused: Not on file    Forced sexual activity: Not on file  Other Topics Concern  . Not on file  Social History Narrative  . Not on file     BP 110/72   Pulse 72   Ht 5\' 2"  (1.575 m)   Wt 290 lb (131.5 kg)   SpO2 98%   BMI 53.04 kg/m   Physical Exam:  Well appearing morbidly obese, NAD HEENT: Unremarkable Neck:  No JVD, no thyromegally Lymphatics:  No adenopathy Back:  No CVA tenderness Lungs:  Clear with no wheezes HEART:  Regular rate rhythm, no murmurs, no rubs, no clicks Abd:  soft, positive bowel sounds, no organomegally, no rebound, no guarding Ext:  2 plus pulses, no edema, no cyanosis, no clubbing Skin:  No rashes no nodules Neuro:  CN II through XII intact, motor grossly intact  EKG - nsr   Assess/Plan: 1. SVT - she is s/p ablation of AVNRT. She has had no evidence of recurrence.  2. Sinus tachycardia - she has had some palpitations. I encouraged her to take an extra cardizem as needed.  3. Snoring - she has evidence of possible sleep apnea. I have recommended she undergo a sleep study. 4. Obesity - weight loss has been encouraged.  Mikle Bosworth.D.

## 2018-12-06 NOTE — Patient Instructions (Addendum)
Medication Instructions:  Your physician recommends that you continue on your current medications as directed. Please refer to the Current Medication list given to you today.  Labwork: None ordered.  Testing/Procedures: None ordered.  Follow-Up: Your physician wants you to follow-up in:  After sleep study with Dr. Lovena Le.    Any Other Special Instructions Will Be Listed Below (If Applicable).  If you need a refill on your cardiac medications before your next appointment, please call your pharmacy.

## 2018-12-17 ENCOUNTER — Encounter (HOSPITAL_COMMUNITY): Payer: Self-pay | Admitting: Emergency Medicine

## 2018-12-17 ENCOUNTER — Emergency Department (HOSPITAL_COMMUNITY): Payer: No Typology Code available for payment source

## 2018-12-17 ENCOUNTER — Other Ambulatory Visit: Payer: Self-pay

## 2018-12-17 ENCOUNTER — Emergency Department (HOSPITAL_COMMUNITY)
Admission: EM | Admit: 2018-12-17 | Discharge: 2018-12-18 | Disposition: A | Discharge status: Home / Self Care | Payer: No Typology Code available for payment source | Attending: Emergency Medicine | Admitting: Emergency Medicine

## 2018-12-17 DIAGNOSIS — D259 Leiomyoma of uterus, unspecified: Secondary | ICD-10-CM | POA: Diagnosis not present

## 2018-12-17 DIAGNOSIS — R1032 Left lower quadrant pain: Secondary | ICD-10-CM | POA: Diagnosis present

## 2018-12-17 DIAGNOSIS — Z79899 Other long term (current) drug therapy: Secondary | ICD-10-CM | POA: Diagnosis not present

## 2018-12-17 DIAGNOSIS — N83292 Other ovarian cyst, left side: Secondary | ICD-10-CM | POA: Insufficient documentation

## 2018-12-17 DIAGNOSIS — N83202 Unspecified ovarian cyst, left side: Secondary | ICD-10-CM

## 2018-12-17 LAB — COMPREHENSIVE METABOLIC PANEL
ALT: 14 U/L (ref 0–44)
AST: 16 U/L (ref 15–41)
Albumin: 3.8 g/dL (ref 3.5–5.0)
Alkaline Phosphatase: 53 U/L (ref 38–126)
Anion gap: 4 — ABNORMAL LOW (ref 5–15)
BUN: 14 mg/dL (ref 6–20)
CO2: 25 mmol/L (ref 22–32)
Calcium: 8.6 mg/dL — ABNORMAL LOW (ref 8.9–10.3)
Chloride: 106 mmol/L (ref 98–111)
Creatinine, Ser: 0.79 mg/dL (ref 0.44–1.00)
GFR calc Af Amer: >60 mL/min (ref >60)
GFR calc non Af Amer: >60 mL/min (ref >60)
Glucose, Bld: 106 mg/dL — ABNORMAL HIGH (ref 70–99)
Potassium: 4 mmol/L (ref 3.5–5.1)
Sodium: 135 mmol/L (ref 135–145)
Total Bilirubin: 0.4 mg/dL (ref 0.3–1.2)
Total Protein: 7.2 g/dL (ref 6.5–8.1)

## 2018-12-17 LAB — LIPASE, BLOOD: Lipase: 26 U/L (ref 11–51)

## 2018-12-17 LAB — CBC WITH DIFFERENTIAL/PLATELET
Abs Immature Granulocytes: 0.04 10*3/uL (ref 0.00–0.07)
Basophils Absolute: 0 10*3/uL (ref 0.0–0.1)
Basophils Relative: 1 %
Eosinophils Absolute: 0.1 10*3/uL (ref 0.0–0.5)
Eosinophils Relative: 2 %
HCT: 37.5 % (ref 36.0–46.0)
Hemoglobin: 11.6 g/dL — ABNORMAL LOW (ref 12.0–15.0)
Immature Granulocytes: 1 %
Lymphocytes Relative: 36 %
Lymphs Abs: 2.5 10*3/uL (ref 0.7–4.0)
MCH: 27.2 pg (ref 26.0–34.0)
MCHC: 30.9 g/dL (ref 30.0–36.0)
MCV: 87.8 fL (ref 80.0–100.0)
Monocytes Absolute: 0.5 10*3/uL (ref 0.1–1.0)
Monocytes Relative: 7 %
Neutro Abs: 3.7 10*3/uL (ref 1.7–7.7)
Neutrophils Relative %: 53 %
Platelets: 316 10*3/uL (ref 150–400)
RBC: 4.27 MIL/uL (ref 3.87–5.11)
RDW: 14.2 % (ref 11.5–15.5)
WBC: 6.9 10*3/uL (ref 4.0–10.5)
nRBC: 0 % (ref 0.0–0.2)

## 2018-12-17 LAB — URINALYSIS, ROUTINE W REFLEX MICROSCOPIC
Bacteria, UA: NONE SEEN
Bilirubin Urine: NEGATIVE
Glucose, UA: NEGATIVE mg/dL
Ketones, ur: NEGATIVE mg/dL
Nitrite: NEGATIVE
Protein, ur: NEGATIVE mg/dL
Specific Gravity, Urine: 1.019 (ref 1.005–1.030)
pH: 7 (ref 5.0–8.0)

## 2018-12-17 LAB — PREGNANCY, URINE: Preg Test, Ur: NEGATIVE

## 2018-12-17 MED ORDER — ONDANSETRON HCL 4 MG/2ML IJ SOLN
4.0000 mg | Freq: Once | INTRAMUSCULAR | Status: AC
  Administered 2018-12-17: 4 mg via INTRAVENOUS
  Filled 2018-12-17: qty 2

## 2018-12-17 MED ORDER — MORPHINE SULFATE (PF) 4 MG/ML IV SOLN
4.0000 mg | Freq: Once | INTRAVENOUS | Status: AC
  Administered 2018-12-17: 4 mg via INTRAVENOUS
  Filled 2018-12-17: qty 1

## 2018-12-17 NOTE — ED Triage Notes (Signed)
Pt c/o left sided flank pain that radiates to LLQ abd since Friday, pt reports last night she began having dark stools x 4 episodes

## 2018-12-17 NOTE — ED Provider Notes (Signed)
[facility] EMERGENCY DEPARTMENT Provider Note   CSN: [id number] Arrival date & time: 12/17/18  1904     History   Chief Complaint Chief Complaint  Patient presents with   Abdominal Pain    HPI Jessica Sanchez is a 33 y.o. female.     HPI   Jessica Sanchez is a 33 y.o. female who presents to the Emergency Department complaining of gradually worsening left flank and left lower quadrant pain.  Symptoms have been present for 2 days.  She states that she describes a sharp pain that radiates from her left flank down to her abdomen.  She states the pain is been constant, but worse today.  Pain also associated with urinary hesitancy, but she denies burning with urination.  Her symptoms are also associated with nausea and she also admits to having dark stools recently and straining to have a bowel movement.  She denies vomiting, fever, chills, abnormal vaginal bleeding or discharge, no dizziness or weakness   Past Medical History:  Diagnosis Date   Fibroid (bleeding) (uterine)    Fibroid, uterine    H/O metrorrhagia    IUD (intrauterine device) in place 07/11/2015   Menorrhagia    Obesity, Class III, BMI 40-49.9 (morbid obesity) ([place]) 11/11/2018   SVT (supraventricular tachycardia) [facility])     Patient Active Problem List   Diagnosis Date Noted   Gastroesophageal reflux disease    Nonspecific chest pain 11/11/2018   Obesity, Class III, BMI 40-49.9 (morbid obesity) ([place]) 11/11/2018   Elevated d-dimer 11/11/2018   SVT (supraventricular tachycardia) ([place]) 10/04/2018   IUD (intrauterine device) in place 07/11/2015   Other disorder of menstruation and other abnormal bleeding from female genital tract 10/30/2012    Past Surgical History:  Procedure Laterality Date   HYSTEROSCOPY W/D&C N/A 09/27/2013   Procedure: DILATATION AND CURETTAGE /HYSTEROSCOPY and insertion of Mirena IUD ;  Surgeon: [name]. Harrington Challenger, MD;  Location: [facility];  Service: Gynecology;  Laterality: N/A;    MYOMECTOMY  02/03/2011   Procedure: MYOMECTOMY;  Surgeon: [name], MD;  Location: [facility];  Service: Gynecology;  Laterality: N/A;   SVT ABLATION N/A 11/08/2018   Procedure: SVT ABLATION;  Surgeon: [name], MD;  Location: [place] [facility];  Service: Cardiovascular;  Laterality: N/A;   TONSILLECTOMY       OB History    Gravida  1   Para      Term      Preterm      AB  1   Living        SAB  1   TAB      Ectopic      Multiple      Live Births               Home Medications    Prior to Admission medications   Medication Sig Start Date End Date Taking? Authorizing Provider  diltiazem (CARDIZEM CD) 180 MG 24 hr capsule Take 1 capsule (180 mg total) by mouth daily for 21 days. 10/09/18 12/17/18 Yes Murray, Alyssa B, PA-C  famotidine (PEPCID) 20 MG tablet Take 1 tablet (20 mg total) by mouth 2 (two) times daily. 12/01/18  Yes Fredia Sorrow, MD  levonorgestrel (MIRENA) 20 MCG/24HR IUD 1 each by Intrauterine route once. EVERY 5 YEARS   Yes [provider]  Multiple Vitamins-Calcium (ONE-A-DAY WOMENS PO) Take 1 tablet by mouth daily.   Yes [provider]  pantoprazole (PROTONIX) 40 MG tablet  Take 1 tablet (40 mg total) by mouth daily. 11/12/18 11/12/19 Yes Barton Dubois, MD  albuterol (PROVENTIL HFA) 108 (90 Base) MCG/ACT inhaler Inhale 1-2 puffs into the lungs every 6 (six) hours as needed for wheezing or shortness of breath.     [provider]    Family History Family History  Problem Relation Age of Onset   Cirrhosis Mother    Diabetes Mother    Cancer Maternal Grandfather    Hypertension Sister    Diabetes Sister    Anesthesia problems Neg Hx    Hypotension Neg Hx    Malignant hyperthermia Neg Hx    Pseudochol deficiency Neg Hx     Social History Social History   Tobacco Use   Smoking status: Never Smoker   Smokeless tobacco: Never Used  Substance Use Topics   Alcohol use: Yes    Comment: occasional     Drug use: No     Allergies   Shellfish allergy and Adhesive [tape]   Review of Systems Review of Systems  Constitutional: Negative for appetite change, chills and fever.  Respiratory: Negative for shortness of breath.   Cardiovascular: Negative for chest pain.  Gastrointestinal: Positive for abdominal pain, constipation and nausea. Negative for blood in stool, rectal pain and vomiting.  Genitourinary: Positive for frequency. Negative for decreased urine volume, difficulty urinating, dysuria, flank pain, vaginal bleeding and vaginal discharge.  Musculoskeletal: Negative for back pain.  Skin: Negative for color change and rash.  Neurological: Negative for dizziness, weakness and numbness.  Hematological: Negative for adenopathy.     Physical Exam Updated Vital Signs BP 116/64    Pulse 81    Temp 97.9 F (36.6 C) (Oral)    Resp 16    Ht 5\' 2"  (1.575 m)    Wt 131.5 kg    SpO2 98%    BMI 53.04 kg/m   Physical Exam Vitals signs and nursing note reviewed. Exam conducted with a chaperone present.  Constitutional:      General: She is not in acute distress.    Appearance: She is well-developed. She is obese.  HENT:     Head: Normocephalic and atraumatic.  Cardiovascular:     Rate and Rhythm: Normal rate and regular rhythm.     Heart sounds: Normal heart sounds. No murmur.  Pulmonary:     Effort: Pulmonary effort is normal. No respiratory distress.     Breath sounds: Normal breath sounds.  Abdominal:     General: Bowel sounds are normal. There is no distension.     Palpations: Abdomen is soft. There is no mass.     Tenderness: There is abdominal tenderness. There is no guarding or rebound.  Genitourinary:    Labia:        Right: No rash or lesion.        Left: No rash or lesion.      Vagina: Vaginal discharge present.     Cervix: No cervical motion tenderness, discharge or cervical bleeding.     Uterus: Not tender.      Adnexa:        Right: No tenderness.          Left: Tenderness present.      Rectum: Guaiac result negative. No mass, anal fissure or external hemorrhoid. Normal anal tone.     Comments: Pelvic exam assisted by nursing staff.  Mild to moderate amount of whitish discharge in the vaginal vault.  No vaginal bleeding.  Tenderness of the left  adnexa.  Exam is limited due to patient's body habitus. Suitt heme negative stool  Musculoskeletal: Normal range of motion.  Skin:    General: Skin is warm and dry.  Neurological:     General: No focal deficit present.     Mental Status: She is alert.     Sensory: No sensory deficit.     Motor: No weakness or abnormal muscle tone.      ED Treatments / Results  Labs (all labs ordered are listed, but only abnormal results are displayed) Labs Reviewed  COMPREHENSIVE METABOLIC PANEL - Abnormal; Notable for the following components:      Result Value   Glucose, Bld 106 (*)    Calcium 8.6 (*)    Anion gap 4 (*)    All other components within normal limits  CBC WITH DIFFERENTIAL/PLATELET - Abnormal; Notable for the following components:   Hemoglobin 11.6 (*)    All other components within normal limits  URINALYSIS, ROUTINE W REFLEX MICROSCOPIC - Abnormal; Notable for the following components:   Hgb urine dipstick MODERATE (*)    Leukocytes,Ua TRACE (*)    All other components within normal limits  LIPASE, BLOOD  PREGNANCY, URINE    EKG None  Radiology US Transvaginal Non-ob  Result Date: 12/18/2018 CLINICAL DATA:  Initial evaluation for left ovarian mass, rule out torsion. EXAM: TRANSABDOMINAL AND TRANSVAGINAL ULTRASOUND OF PELVIS DOPPLER ULTRASOUND OF OVARIES TECHNIQUE: Both transabdominal and transvaginal ultrasound examinations of the pelvis were performed. Transabdominal technique was performed for global imaging of the pelvis including uterus, ovaries, adnexal regions, and pelvic cul-de-sac. It was necessary to proceed with endovaginal exam following the transabdominal exam to visualize  the uterus, endometrium, and ovaries. Color and duplex Doppler ultrasound was utilized to evaluate blood flow to the ovaries. COMPARISON:  Prior CT from 12/17/2018. FINDINGS: Uterus Measurements: 8.8 x 5.8 x 7.0 cm = volume: 188.7 mL. 2.5 x 1.8 x 2.0 cm intramural fibroid present at the right posterior uterine fundus. 1.8 x 1.8 x 1.8 cm intramural fibroid present at the left anterior uterine fundus. 2.4 x 1.4 x 2.2 cm submucosal fibroid present within the central aspect of the uterus. Endometrium Thickness: 4 mm. No focal abnormality visualized. IUD in appropriate position within the endometrial cavity. Right ovary Not visualized.  No adnexal mass. Left ovary Measurements: 7.1 x 5.6 x 6.6 cm = volume: 137 mL. 6.1 x 5.6 x 5.4 cm cyst seen arising from the left ovary. Cyst is minimally complex with a few scattered low-level internal echoes. No solid nodularity or definite discrete septation. No internal vascularity. Pulsed Doppler evaluation of the left ovary demonstrates normal low-resistance arterial and venous waveforms. Other findings No abnormal free fluid. IMPRESSION: 1. 6.1 cm minimally complex left ovarian cyst, corresponding with abnormality seen on prior CT. While this lesion has benign characteristics, a short interval follow-up ultrasound in 6-12 weeks to ensure resolution is recommended. 2. No evidence for ovarian torsion. 3. Fibroid uterus as detailed above. 4. IUD in appropriate position within the endometrial cavity. Its item nonvisualization of the right ovary. No right adnexal mass. Electronically Signed   By: Jeannine Boga M.D.   On: 12/18/2018 00:47   US Pelvis Complete  Result Date: 12/18/2018 CLINICAL DATA:  Initial evaluation for left ovarian mass, rule out torsion. EXAM: TRANSABDOMINAL AND TRANSVAGINAL ULTRASOUND OF PELVIS DOPPLER ULTRASOUND OF OVARIES TECHNIQUE: Both transabdominal and transvaginal ultrasound examinations of the pelvis were performed. Transabdominal technique was  performed for global imaging of the pelvis including  uterus, ovaries, adnexal regions, and pelvic cul-de-sac. It was necessary to proceed with endovaginal exam following the transabdominal exam to visualize the uterus, endometrium, and ovaries. Color and duplex Doppler ultrasound was utilized to evaluate blood flow to the ovaries. COMPARISON:  Prior CT from 12/17/2018. FINDINGS: Uterus Measurements: 8.8 x 5.8 x 7.0 cm = volume: 188.7 mL. 2.5 x 1.8 x 2.0 cm intramural fibroid present at the right posterior uterine fundus. 1.8 x 1.8 x 1.8 cm intramural fibroid present at the left anterior uterine fundus. 2.4 x 1.4 x 2.2 cm submucosal fibroid present within the central aspect of the uterus. Endometrium Thickness: 4 mm. No focal abnormality visualized. IUD in appropriate position within the endometrial cavity. Right ovary Not visualized.  No adnexal mass. Left ovary Measurements: 7.1 x 5.6 x 6.6 cm = volume: 137 mL. 6.1 x 5.6 x 5.4 cm cyst seen arising from the left ovary. Cyst is minimally complex with a few scattered low-level internal echoes. No solid nodularity or definite discrete septation. No internal vascularity. Pulsed Doppler evaluation of the left ovary demonstrates normal low-resistance arterial and venous waveforms. Other findings No abnormal free fluid. IMPRESSION: 1. 6.1 cm minimally complex left ovarian cyst, corresponding with abnormality seen on prior CT. While this lesion has benign characteristics, a short interval follow-up ultrasound in 6-12 weeks to ensure resolution is recommended. 2. No evidence for ovarian torsion. 3. Fibroid uterus as detailed above. 4. IUD in appropriate position within the endometrial cavity. Its item nonvisualization of the right ovary. No right adnexal mass. Electronically Signed   By: Jeannine Boga M.D.   On: 12/18/2018 00:47   [place] Art/ven Flow Abd Pelv Doppler  Result Date: 12/18/2018 CLINICAL DATA:  Initial evaluation for left ovarian mass, rule out torsion.  EXAM: TRANSABDOMINAL AND TRANSVAGINAL ULTRASOUND OF PELVIS DOPPLER ULTRASOUND OF OVARIES TECHNIQUE: Both transabdominal and transvaginal ultrasound examinations of the pelvis were performed. Transabdominal technique was performed for global imaging of the pelvis including uterus, ovaries, adnexal regions, and pelvic cul-de-sac. It was necessary to proceed with endovaginal exam following the transabdominal exam to visualize the uterus, endometrium, and ovaries. Color and duplex Doppler ultrasound was utilized to evaluate blood flow to the ovaries. COMPARISON:  Prior CT from 12/17/2018. FINDINGS: Uterus Measurements: 8.8 x 5.8 x 7.0 cm = volume: 188.7 mL. 2.5 x 1.8 x 2.0 cm intramural fibroid present at the right posterior uterine fundus. 1.8 x 1.8 x 1.8 cm intramural fibroid present at the left anterior uterine fundus. 2.4 x 1.4 x 2.2 cm submucosal fibroid present within the central aspect of the uterus. Endometrium Thickness: 4 mm. No focal abnormality visualized. IUD in appropriate position within the endometrial cavity. Right ovary Not visualized.  No adnexal mass. Left ovary Measurements: 7.1 x 5.6 x 6.6 cm = volume: 137 mL. 6.1 x 5.6 x 5.4 cm cyst seen arising from the left ovary. Cyst is minimally complex with a few scattered low-level internal echoes. No solid nodularity or definite discrete septation. No internal vascularity. Pulsed Doppler evaluation of the left ovary demonstrates normal low-resistance arterial and venous waveforms. Other findings No abnormal free fluid. IMPRESSION: 1. 6.1 cm minimally complex left ovarian cyst, corresponding with abnormality seen on prior CT. While this lesion has benign characteristics, a short interval follow-up ultrasound in 6-12 weeks to ensure resolution is recommended. 2. No evidence for ovarian torsion. 3. Fibroid uterus as detailed above. 4. IUD in appropriate position within the endometrial cavity. Its item nonvisualization of the right ovary. No right adnexal  mass.  Electronically Signed   By: Jeannine Boga M.D.   On: 12/18/2018 00:47   Ct Renal Stone Study  Result Date: 12/17/2018 CLINICAL DATA:  Flank pain, stone disease suspected EXAM: CT ABDOMEN AND PELVIS WITHOUT CONTRAST TECHNIQUE: Multidetector CT imaging of the abdomen and pelvis was performed following the standard protocol without IV contrast. COMPARISON:  CT 08/01/2017 FINDINGS: Lower chest: Basilar areas of atelectasis and/or scarring. Included mediastinum is unremarkable. Hepatobiliary: No focal liver abnormality is seen. No gallstones, gallbladder wall thickening, or biliary dilatation. Pancreas: Unremarkable. No pancreatic ductal dilatation or surrounding inflammatory changes. Spleen: Normal in size without focal abnormality. Small accessory splenule. Adrenals/Urinary Tract: Adrenal glands are unremarkable. Kidneys are normal, without renal calculi, visible or contour deforming renal lesion, or hydronephrosis. No calcifications are seen along the course of either ureter. Bladder is unremarkable. Stomach/Bowel: Distal esophagus, stomach and duodenal sweep are unremarkable. No bowel wall thickening or dilatation. No evidence of obstruction. Vascular/Lymphatic: The aorta is normal caliber. No suspicious or enlarged lymph nodes in the included lymphatic chains. Reproductive: Asymmetric enlargement of the left ovary with an intermediate attenuation (25 HU) left adnexal lesion measuring up to 6.8 cm in size (2/61). Which displaces the uterus slightly rightward. This is new from comparison study 08/01/2017. No right adnexal lesions. The uterus is anteverted and displaced rightward. Expected position of the radiopaque IUD. Other: No abdominopelvic free fluid or free gas. No bowel containing hernias. Fat containing umbilical hernia. Mild body wall edema. Musculoskeletal: No acute osseous abnormality or suspicious osseous lesion. IMPRESSION: 1. Asymmetric enlargement of the left ovary with an intermediate  attenuation (25 HU) left adnexal lesion measuring up to 6.8 cm in size, which displaces the uterus slightly rightward. Finding could reflect complex or hemorrhagic cyst, ovarian neoplasm or enlarged torsed ovary among other differential considerations. Recommend gynecologic consultation and further evaluation with pelvic ultrasound. 2. No renal or ureteral calculi. No hydronephrosis. These results were called by telephone at the time of interpretation on 12/17/2018 at 11:10 pm to Dr. Kem Parkinson , who verbally acknowledged these results. Electronically Signed   By: Lovena Le M.D.   On: 12/17/2018 23:12    Procedures Procedures (including critical care time)  Medications Ordered in ED Medications  ondansetron [facility]) injection 4 mg (4 mg Intravenous Given 12/17/18 2104)  morphine 4 MG/ML injection 4 mg (4 mg Intravenous Given 12/17/18 2105)     Initial Impression / Assessment and Plan / ED Course  I have reviewed the triage vital signs and the nursing notes.  Pertinent labs & imaging results that were available during my care of the patient were reviewed by me and considered in my medical decision making (see chart for details).        Patient with left flank pain, nausea for 2 days.  Symptoms worsen this evening.  She is uncomfortable appearing, non toxic.  Will obtain labs, CT renal stone study and address pain with morphine.  On recheck, patient reports feeling better after pain medication.  Nausea has resolved.  On bimanual exam patient is very tender at the area of the left adnexa.  She is morbidly obese and therefore exam is limited due to her body habitus.  CT scan results were discussed with the radiologist, I will order pelvic ultrasound to rule out possible ovarian torsion.    Pelvic US does not show evidence of ovarian torsion.  Felt to be complex cyst.  I have discussed findings with the pt and importance of close GYN f/u. she verbalized  understanding and agrees to arrange  f/u with Dr. Johnnye Sima office.    Final Clinical Impressions(s) / ED Diagnoses   Final diagnoses:  Cyst of left ovary  Uterine leiomyoma, unspecified location    ED Discharge Orders    None       Bufford Lope 12/18/18 0118    Milton Ferguson, MD 12/19/18 1258

## 2018-12-18 LAB — WET PREP, GENITAL
Sperm: NONE SEEN
Trich, Wet Prep: NONE SEEN
Yeast Wet Prep HPF POC: NONE SEEN

## 2018-12-18 LAB — POC OCCULT BLOOD, ED: Fecal Occult Bld: NEGATIVE

## 2018-12-18 MED ORDER — MORPHINE SULFATE (PF) 2 MG/ML IV SOLN
2.0000 mg | Freq: Once | INTRAVENOUS | Status: AC
  Administered 2018-12-18: 01:00:00 2 mg via INTRAVENOUS
  Filled 2018-12-18: qty 1

## 2018-12-18 NOTE — Discharge Instructions (Addendum)
Ultrasound shows that you have a large ovarian cyst.  This will need follow-up with [facility].  Call their office to arrange a follow-up appt.  You may apply heat on/off to your abdomen.  Tylenol every 4 hrs if needed for pain.

## 2018-12-18 NOTE — ED Notes (Signed)
Pt GC swab and wet prep swab were previously collected, order was entered after pt discharged, specimens taken to lab

## 2018-12-19 LAB — GC/CHLAMYDIA PROBE AMP (~~LOC~~) NOT AT ARMC
Chlamydia: NEGATIVE
Neisseria Gonorrhea: NEGATIVE

## 2018-12-28 ENCOUNTER — Other Ambulatory Visit: Payer: Self-pay

## 2018-12-28 ENCOUNTER — Encounter: Payer: Self-pay | Admitting: Obstetrics & Gynecology

## 2018-12-28 ENCOUNTER — Ambulatory Visit (INDEPENDENT_AMBULATORY_CARE_PROVIDER_SITE_OTHER): Payer: No Typology Code available for payment source | Admitting: Obstetrics & Gynecology

## 2018-12-28 VITALS — BP 113/73 | HR 80 | Ht 62.0 in | Wt 299.5 lb

## 2018-12-28 DIAGNOSIS — N83202 Unspecified ovarian cyst, left side: Secondary | ICD-10-CM

## 2018-12-28 MED ORDER — MEGESTROL ACETATE 40 MG PO TABS: ORAL_TABLET | ORAL | 3 refills | Status: DC

## 2018-12-28 NOTE — Progress Notes (Signed)
Chief Complaint  Patient presents with  . Ovarian Cyst  . Fibroids      33 y.o. G1P0010 No LMP recorded. (Menstrual status: IUD). The current method of family planning is IUD.  Outpatient Encounter Medications as of 12/28/2018  Medication Sig  . albuterol (PROVENTIL HFA) 108 (90 Base) MCG/ACT inhaler Inhale 1-2 puffs into the lungs every 6 (six) hours as needed for wheezing or shortness of breath.   . diltiazem (CARDIZEM CD) 180 MG 24 hr capsule Take 1 capsule (180 mg total) by mouth daily for 21 days.  . famotidine (PEPCID) 20 MG tablet Take 1 tablet (20 mg total) by mouth 2 (two) times daily.  Marland Kitchen levonorgestrel (MIRENA) 20 MCG/24HR IUD 1 each by Intrauterine route once. EVERY 5 YEARS  . megestrol (MEGACE) 40 MG tablet 1 tablet per day  . Multiple Vitamins-Calcium (ONE-A-DAY WOMENS PO) Take 1 tablet by mouth daily.  . pantoprazole (PROTONIX) 40 MG tablet Take 1 tablet (40 mg total) by mouth daily.   No facility-administered encounter medications on file as of 12/28/2018.     Subjective Pt had CT scan at [facility] and follow up sonogram which reveals a 6.1 cm complex left ovarian cyst 3 small fibroids Some LLQ discomfort from time to time sharp moderate no associated symptoms Past Medical History:  Diagnosis Date  . Fibroid (bleeding) (uterine)   . Fibroid, uterine   . H/O metrorrhagia   . IUD (intrauterine device) in place 07/11/2015  . Menorrhagia   . Obesity, Class III, BMI 40-49.9 (morbid obesity) ([place]) 11/11/2018  . SVT (supraventricular tachycardia) ([facility])     Past Surgical History:  Procedure Laterality Date  . HYSTEROSCOPY W/D&C N/A 09/27/2013   Procedure: DILATATION AND CURETTAGE /HYSTEROSCOPY and insertion of Mirena IUD ;  Surgeon: [name]. Harrington Challenger, MD;  Location: [facility];  Service: Gynecology;  Laterality: N/A;  . MYOMECTOMY  02/03/2011   Procedure: MYOMECTOMY;  Surgeon: [name], MD;  Location: [facility];  Service: Gynecology;  Laterality: N/A;  . SVT ABLATION N/A  11/08/2018   Procedure: SVT ABLATION;  Surgeon: [name], MD;  Location: [place];  Service: Cardiovascular;  Laterality: N/A;  . TONSILLECTOMY      OB History    Gravida  1   Para      Term      Preterm      AB  1   Living        SAB  1   TAB      Ectopic      Multiple      Live Births              Allergies  Allergen Reactions  . Shellfish Allergy Shortness Of Breath, Itching and Swelling    Mouth became swollen, throat started itching, and developed shortness of breath  . Adhesive [Tape] Itching    Depends on the type of medical tape    Social History   Socioeconomic History  . Marital status: Single    Spouse name: Not on file  . Number of children: Not on file  . Years of education: Not on file  . Highest education level: Not on file  Occupational History  . Not on file  Social Needs  . Financial resource strain: Not on file  . Food insecurity    Worry: Not on file    Inability: Not on file  . Transportation needs    Medical: Not on  file    Non-medical: Not on file  Tobacco Use  . Smoking status: Never Smoker  . Smokeless tobacco: Never Used  Substance and Sexual Activity  . Alcohol use: Yes    Comment: occasional  . Drug use: No  . Sexual activity: Yes    Birth control/protection: I.U.D.  Lifestyle  . Physical activity    Days per week: Not on file    Minutes per session: Not on file  . Stress: Not on file  Relationships  . Social Herbalist on phone: Not on file    Gets together: Not on file    Attends religious service: Not on file    Active member of club or organization: Not on file    Attends meetings of clubs or organizations: Not on file    Relationship status: Not on file  Other Topics Concern  . Not on file  Social History Narrative  . Not on file    Family History  Problem Relation Age of Onset  . Cirrhosis Mother   . Diabetes Mother   . Cancer Maternal Grandfather   . Hypertension  Sister   . Diabetes Sister   . Anesthesia problems Neg Hx   . Hypotension Neg Hx   . Malignant hyperthermia Neg Hx   . Pseudochol deficiency Neg Hx     Medications:       Current Outpatient Medications:  .  albuterol (PROVENTIL HFA) 108 (90 Base) MCG/ACT inhaler, Inhale 1-2 puffs into the lungs every 6 (six) hours as needed for wheezing or shortness of breath. , Disp: , Rfl:  .  diltiazem (CARDIZEM CD) 180 MG 24 hr capsule, Take 1 capsule (180 mg total) by mouth daily for 21 days., Disp: 21 capsule, Rfl: 0 .  famotidine (PEPCID) 20 MG tablet, Take 1 tablet (20 mg total) by mouth 2 (two) times daily., Disp: 30 tablet, Rfl: 0 .  levonorgestrel (MIRENA) 20 MCG/24HR IUD, 1 each by Intrauterine route once. EVERY 5 YEARS, Disp: , Rfl:  .  megestrol (MEGACE) 40 MG tablet, 1 tablet per day, Disp: 30 tablet, Rfl: 3 .  Multiple Vitamins-Calcium (ONE-A-DAY WOMENS PO), Take 1 tablet by mouth daily., Disp: , Rfl:  .  pantoprazole (PROTONIX) 40 MG tablet, Take 1 tablet (40 mg total) by mouth daily., Disp: 30 tablet, Rfl: 1  Objective Blood pressure 113/73, pulse 80, height 5\' 2"  (1.575 m), weight 299 lb 8 oz (135.9 kg). Gen WDWN NAD  Pertinent ROS No burning with urination, frequency or urgency No nausea, vomiting or diarrhea Nor fever chills or other constitutional symptoms   Labs or studies Reviewed scans and labs    Impression Diagnoses this Encounter::   ICD-10-CM   1. Left ovarian cyst  N83.202 US PELVIS (TRANSABDOMINAL ONLY)    US PELVIS TRANSVAGINAL NON-OB (TV ONLY)    Established relevant diagnosis(es):   Plan/Recommendations: Meds ordered this encounter  Medications  . megestrol (MEGACE) 40 MG tablet    Sig: 1 tablet per day    Dispense:  30 tablet    Refill:  3    Labs or Scans Ordered: Orders Placed This Encounter  Procedures  . US PELVIS (TRANSABDOMINAL ONLY)  . US PELVIS TRANSVAGINAL NON-OB (TV ONLY)    Management:: >megace suppression >repeat scan to  evaluate left ovarian cyst, suspect this is a benign ovarian neoplasm, mucinous or serous cystadenoma  Follow up Return in about 3 months (around 03/30/2019) for GYN sono, Follow up, with Dr Elonda Husky.  Face to face time:  15 minutes  Greater than 50% of the visit time was spent in counseling and coordination of care with the patient.  The summary and outline of the counseling and care coordination is summarized in the note above.   All questions were answered.

## 2018-12-29 ENCOUNTER — Encounter (HOSPITAL_COMMUNITY): Payer: Self-pay | Admitting: *Deleted

## 2018-12-29 ENCOUNTER — Other Ambulatory Visit: Payer: Self-pay

## 2018-12-29 ENCOUNTER — Emergency Department (HOSPITAL_COMMUNITY)
Admission: EM | Admit: 2018-12-29 | Discharge: 2018-12-29 | Disposition: A | Discharge status: Home / Self Care | Payer: No Typology Code available for payment source | Attending: Emergency Medicine | Admitting: Emergency Medicine

## 2018-12-29 ENCOUNTER — Telehealth: Payer: Self-pay | Admitting: Internal Medicine

## 2018-12-29 DIAGNOSIS — Z20828 Contact with and (suspected) exposure to other viral communicable diseases: Secondary | ICD-10-CM | POA: Insufficient documentation

## 2018-12-29 DIAGNOSIS — R05 Cough: Secondary | ICD-10-CM | POA: Insufficient documentation

## 2018-12-29 DIAGNOSIS — J029 Acute pharyngitis, unspecified: Secondary | ICD-10-CM | POA: Diagnosis not present

## 2018-12-29 DIAGNOSIS — M791 Myalgia, unspecified site: Secondary | ICD-10-CM | POA: Insufficient documentation

## 2018-12-29 DIAGNOSIS — R400 Somnolence: Secondary | ICD-10-CM

## 2018-12-29 DIAGNOSIS — R5383 Other fatigue: Secondary | ICD-10-CM

## 2018-12-29 MED ORDER — AMOXICILLIN 500 MG PO CAPS: 500.0000 mg | ORAL_CAPSULE | Freq: Three times a day (TID) | ORAL | 0 refills | Status: DC

## 2018-12-29 NOTE — Discharge Instructions (Addendum)
Get help right away if: You have difficulty breathing. You cannot swallow fluids, soft foods, or your saliva. You have increased swelling in your throat or neck. You have persistent nausea and vomiting.     Person Under Monitoring Name: Jessica Sanchez  Location: [address] [place] [address]   Infection Prevention Recommendations for Individuals Confirmed to have, or Being Evaluated for, 2019 Novel Coronavirus (COVID-19) Infection Who Receive Care at Home  Individuals who are confirmed to have, or are being evaluated for, COVID-19 should follow the prevention steps below until a healthcare provider or local or state health department says they can return to normal activities.  Stay home except to get medical care You should restrict activities outside your home, except for getting medical care. Do not go to work, school, or public areas, and do not use public transportation or taxis.  Call ahead before visiting your doctor Before your medical appointment, call the healthcare provider and tell them that you have, or are being evaluated for, COVID-19 infection. This will help the healthcare providers office take steps to keep other people from getting infected. Ask your healthcare provider to call the local or state health department.  Monitor your symptoms Seek prompt medical attention if your illness is worsening (e.g., difficulty breathing). Before going to your medical appointment, call the healthcare provider and tell them that you have, or are being evaluated for, COVID-19 infection. Ask your healthcare provider to call the local or state health department.  Wear a facemask You should wear a facemask that covers your nose and mouth when you are in the same room with other people and when you visit a healthcare provider. People who live with or visit you should also wear a facemask while they are in the same room with you.  Separate yourself from other people in your  home As much as possible, you should stay in a different room from other people in your home. Also, you should use a separate bathroom, if available.  Avoid sharing household items You should not share dishes, drinking glasses, cups, eating utensils, towels, bedding, or other items with other people in your home. After using these items, you should wash them thoroughly with soap and water.  Cover your coughs and sneezes Cover your mouth and nose with a tissue when you cough or sneeze, or you can cough or sneeze into your sleeve. Throw used tissues in a lined trash can, and immediately wash your hands with soap and water for at least 20 seconds or use an alcohol-based hand rub.  Wash your [facility] your hands often and thoroughly with soap and water for at least 20 seconds. You can use an alcohol-based hand sanitizer if soap and water are not available and if your hands are not visibly dirty. Avoid touching your eyes, nose, and mouth with unwashed hands.   Prevention Steps for Caregivers and Household Members of Individuals Confirmed to have, or Being Evaluated for, COVID-19 Infection Being Cared for in the Home  If you live with, or provide care at home for, a person confirmed to have, or being evaluated for, COVID-19 infection please follow these guidelines to prevent infection:  Follow healthcare providers instructions Make sure that you understand and can help the patient follow any healthcare provider instructions for all care.  Provide for the patients basic needs You should help the patient with basic needs in the home and provide support for getting groceries, prescriptions, and other personal needs.  Monitor  the patients symptoms If they are getting sicker, call his or her medical provider and tell them that the patient has, or is being evaluated for, COVID-19 infection. This will help the healthcare providers office take steps to keep other people from getting  infected. Ask the healthcare provider to call the local or state health department.  Limit the number of people who have contact with the patient If possible, have only one caregiver for the patient. Other household members should stay in another home or place of residence. If this is not possible, they should stay in another room, or be separated from the patient as much as possible. Use a separate bathroom, if available. Restrict visitors who do not have an essential need to be in the home.  Keep older adults, very young children, and other sick people away from the patient Keep older adults, very young children, and those who have compromised immune systems or chronic health conditions away from the patient. This includes people with chronic heart, lung, or kidney conditions, diabetes, and cancer.  Ensure good ventilation Make sure that shared spaces in the home have good air flow, such as from an air conditioner or an opened window, weather permitting.  Wash your hands often Wash your hands often and thoroughly with soap and water for at least 20 seconds. You can use an alcohol based hand sanitizer if soap and water are not available and if your hands are not visibly dirty. Avoid touching your eyes, nose, and mouth with unwashed hands. Use disposable paper towels to dry your hands. If not available, use dedicated cloth towels and replace them when they become wet.  Wear a facemask and gloves Wear a disposable facemask at all times in the room and gloves when you touch or have contact with the patients blood, body fluids, and/or secretions or excretions, such as sweat, saliva, sputum, nasal mucus, vomit, urine, or feces.  Ensure the mask fits over your nose and mouth tightly, and do not touch it during use. Throw out disposable facemasks and gloves after using them. Do not reuse. Wash your hands immediately after removing your facemask and gloves. If your personal clothing becomes  contaminated, carefully remove clothing and launder. Wash your hands after handling contaminated clothing. Place all used disposable facemasks, gloves, and other waste in a lined container before disposing them with other household waste. Remove gloves and wash your hands immediately after handling these items.  Do not share dishes, glasses, or other household items with the patient Avoid sharing household items. You should not share dishes, drinking glasses, cups, eating utensils, towels, bedding, or other items with a patient who is confirmed to have, or being evaluated for, COVID-19 infection. After the person uses these items, you should wash them thoroughly with soap and water.  Wash laundry thoroughly Immediately remove and wash clothes or bedding that have blood, body fluids, and/or secretions or excretions, such as sweat, saliva, sputum, nasal mucus, vomit, urine, or feces, on them. Wear gloves when handling laundry from the patient. Read and follow directions on labels of laundry or clothing items and detergent. In general, wash and dry with the warmest temperatures recommended on the label.  Clean all areas the individual has used often Clean all touchable surfaces, such as counters, tabletops, doorknobs, bathroom fixtures, toilets, phones, keyboards, tablets, and bedside tables, every day. Also, clean any surfaces that may have blood, body fluids, and/or secretions or excretions on them. Wear gloves when cleaning surfaces the patient has come  in contact with. Use a diluted bleach solution (e.g., dilute bleach with 1 part bleach and 10 parts water) or a household disinfectant with a label that says EPA-registered for coronaviruses. To make a bleach solution at home, add 1 tablespoon of bleach to 1 quart (4 cups) of water. For a larger supply, add  cup of bleach to 1 gallon (16 cups) of water. Read labels of cleaning products and follow recommendations provided on product labels. Labels  contain instructions for safe and effective use of the cleaning product including precautions you should take when applying the product, such as wearing gloves or eye protection and making sure you have good ventilation during use of the product. Remove gloves and wash hands immediately after cleaning.  Monitor yourself for signs and symptoms of illness Caregivers and household members are considered close contacts, should monitor their health, and will be asked to limit movement outside of the home to the extent possible. Follow the monitoring steps for close contacts listed on the symptom monitoring form.   ? If you have additional questions, contact your local health department or call the epidemiologist on call at [phone] (available 24/7). ? This guidance is subject to change. For the most up-to-date guidance from [facility], please refer to their website: [email or web address]

## 2018-12-29 NOTE — ED Provider Notes (Signed)
[facility] EMERGENCY DEPARTMENT Provider Note   CSN: [id number] Arrival date & time: 12/29/18  1150     History   Chief Complaint Chief Complaint  Patient presents with  . Sore Throat    HPI Jessica Sanchez is a 33 y.o. female who presents the emergency department chief complaint of viral symptoms.  Patient states he awoke yesterday morning with a sore throat.  She now complains of dry cough, sore throat, body aches, chills, weakness, and mild headache.  She was noted to be febrile upon arrival here today.  She denies difficulty swallowing.  She has no contacts with similar symptoms.     HPI  Past Medical History:  Diagnosis Date  . Fibroid (bleeding) (uterine)   . Fibroid, uterine   . H/O metrorrhagia   . IUD (intrauterine device) in place 07/11/2015  . Menorrhagia   . Obesity, Class III, BMI 40-49.9 (morbid obesity) ([place]) 11/11/2018  . SVT (supraventricular tachycardia) [facility])     Patient Active Problem List   Diagnosis Date Noted  . Gastroesophageal reflux disease   . Nonspecific chest pain 11/11/2018  . Obesity, Class III, BMI 40-49.9 (morbid obesity) ([place]) 11/11/2018  . Elevated d-dimer 11/11/2018  . SVT (supraventricular tachycardia) ([place]) 10/04/2018  . IUD (intrauterine device) in place 07/11/2015  . Other disorder of menstruation and other abnormal bleeding from female genital tract 10/30/2012    Past Surgical History:  Procedure Laterality Date  . HYSTEROSCOPY W/D&C N/A 09/27/2013   Procedure: DILATATION AND CURETTAGE /HYSTEROSCOPY and insertion of Mirena IUD ;  Surgeon: [name]. Harrington Challenger, MD;  Location: [facility];  Service: Gynecology;  Laterality: N/A;  . MYOMECTOMY  02/03/2011   Procedure: MYOMECTOMY;  Surgeon: [name], MD;  Location: [facility];  Service: Gynecology;  Laterality: N/A;  . SVT ABLATION N/A 11/08/2018   Procedure: SVT ABLATION;  Surgeon: [name], MD;  Location: [place];  Service: Cardiovascular;  Laterality: N/A;  . TONSILLECTOMY        OB History    Gravida  1   Para      Term      Preterm      AB  1   Living        SAB  1   TAB      Ectopic      Multiple      Live Births               Home Medications    Prior to Admission medications   Medication Sig Start Date End Date Taking? Authorizing Provider  albuterol (PROVENTIL HFA) 108 (90 Base) MCG/ACT inhaler Inhale 1-2 puffs into the lungs every 6 (six) hours as needed for wheezing or shortness of breath.     [provider]  amoxicillin (AMOXIL) 500 MG capsule Take 1 capsule (500 mg total) by mouth 3 (three) times daily. 12/29/18   Margarita Mail, PA-C  diltiazem (CARDIZEM CD) 180 MG 24 hr capsule Take 1 capsule (180 mg total) by mouth daily for 21 days. 10/09/18 12/17/18  Langston Masker B, PA-C  famotidine (PEPCID) 20 MG tablet Take 1 tablet (20 mg total) by mouth 2 (two) times daily. 12/01/18   Fredia Sorrow, MD  levonorgestrel (MIRENA) 20 MCG/24HR IUD 1 each by Intrauterine route once. EVERY 5 YEARS    [provider]  megestrol (MEGACE) 40 MG tablet 1 tablet per day 12/28/18   [name], MD  Multiple Vitamins-Calcium (ONE-A-DAY WOMENS PO)  Take 1 tablet by mouth daily.    [provider]  pantoprazole (PROTONIX) 40 MG tablet Take 1 tablet (40 mg total) by mouth daily. 11/12/18 11/12/19  Barton Dubois, MD    Family History Family History  Problem Relation Age of Onset  . Cirrhosis Mother   . Diabetes Mother   . Cancer Maternal Grandfather   . Hypertension Sister   . Diabetes Sister   . Anesthesia problems Neg Hx   . Hypotension Neg Hx   . Malignant hyperthermia Neg Hx   . Pseudochol deficiency Neg Hx     Social History Social History   Tobacco Use  . Smoking status: Never Smoker  . Smokeless tobacco: Never Used  Substance Use Topics  . Alcohol use: Yes    Comment: occasional  . Drug use: No     Allergies   Shellfish allergy and Adhesive [tape]   Review of Systems Review of Systems Ten  systems reviewed and are negative for acute change, except as noted in the HPI.    Physical Exam Updated Vital Signs BP 121/77 (BP Location: Right Arm)   Pulse 98   Temp (!) 100.9 F (38.3 C) (Oral)   Resp 17   Ht 5\' 2"  (1.575 m)   Wt 131.5 kg   SpO2 99%   BMI 53.04 kg/m   Physical Exam Vitals signs and nursing note reviewed.  Constitutional:      General: She is not in acute distress.    Appearance: She is well-developed. She is not diaphoretic.  HENT:     Head: Normocephalic and atraumatic.     Right Ear: Tympanic membrane normal.     Left Ear: Tympanic membrane normal.     Mouth/Throat:     Pharynx: Pharyngeal swelling, oropharyngeal exudate and posterior oropharyngeal erythema present.  Eyes:     General: No scleral icterus.    Conjunctiva/sclera: Conjunctivae normal.  Neck:     Musculoskeletal: Normal range of motion.  Cardiovascular:     Rate and Rhythm: Normal rate and regular rhythm.     Heart sounds: Normal heart sounds. No murmur. No friction rub. No gallop.   Pulmonary:     Effort: Pulmonary effort is normal. No respiratory distress.     Breath sounds: Normal breath sounds.  Abdominal:     General: Bowel sounds are normal. There is no distension.     Palpations: Abdomen is soft. There is no mass.     Tenderness: There is no abdominal tenderness. There is no guarding.  Skin:    General: Skin is warm and dry.  Neurological:     Mental Status: She is alert and oriented to person, place, and time.  Psychiatric:        Behavior: Behavior normal.      ED Treatments / Results  Labs (all labs ordered are listed, but only abnormal results are displayed) Labs Reviewed  NOVEL CORONAVIRUS, NAA (HOSP ORDER, SEND-OUT TO REF LAB; TAT 18-24 HRS)    EKG None  Radiology No results found.  Procedures Procedures (including critical care time)  Medications Ordered in ED Medications - No data to display   Initial Impression / Assessment and Plan / ED  Course  I have reviewed the triage vital signs and the nursing notes.  Pertinent labs & imaging results that were available during my care of the patient were reviewed by me and considered in my medical decision making (see chart for details).  Patient febrile here today.  Send out coronavirus test initiated.  Will treat for presumed strep with amoxicillin.  Home isolation precautions given.  Discussed return precautions.  Follow-up with PCP.  DERYN ZELINSKI was evaluated in Emergency Department on 12/29/2018 for the symptoms described in the history of present illness. She was evaluated in the context of the global COVID-19 pandemic, which necessitated consideration that the patient might be at risk for infection with the SARS-CoV-2 virus that causes COVID-19. Institutional protocols and algorithms that pertain to the evaluation of patients at risk for COVID-19 are in a state of rapid change based on information released by regulatory bodies including the CDC and federal and state organizations. These policies and algorithms were followed during the patient's care in the ED.   Final Clinical Impressions(s) / ED Diagnoses   Final diagnoses:  Acute pharyngitis, unspecified etiology    ED Discharge Orders         Ordered    amoxicillin (AMOXIL) 500 MG capsule  3 times daily     12/29/18 1230           Margarita Mail, PA-C 12/29/18 1231    Margette Fast, MD 12/30/18 [phone]

## 2018-12-29 NOTE — Telephone Encounter (Signed)
Patient was recently seen by Dr. Lovena Le and he had her set up for at home sleep study for her insurance will not cover that.  They will cover a sleep study at the hospital, she wants to know how see can get that set up.

## 2018-12-29 NOTE — ED Triage Notes (Signed)
Patient in with cough, congestion and sore throat with decreased appetite for one day.

## 2018-12-30 LAB — NOVEL CORONAVIRUS, NAA (HOSP ORDER, SEND-OUT TO REF LAB; TAT 18-24 HRS): SARS-CoV-2, NAA: NOT DETECTED

## 2019-01-01 NOTE — Telephone Encounter (Signed)
Patient notified that I have sent the sleep study request to the sleep pool for an in lab sleep study. Patient understands she will be contacted with a sleep study date and a covid test date once her insurance sends back their decision.

## 2019-01-16 ENCOUNTER — Encounter: Payer: Self-pay | Admitting: *Deleted

## 2019-01-16 ENCOUNTER — Emergency Department (HOSPITAL_COMMUNITY): Payer: No Typology Code available for payment source

## 2019-01-16 ENCOUNTER — Emergency Department (HOSPITAL_COMMUNITY)
Admission: EM | Admit: 2019-01-16 | Discharge: 2019-01-16 | Disposition: A | Discharge status: Home / Self Care | Payer: No Typology Code available for payment source | Attending: Emergency Medicine | Admitting: Emergency Medicine

## 2019-01-16 ENCOUNTER — Encounter (HOSPITAL_COMMUNITY): Payer: Self-pay | Admitting: Emergency Medicine

## 2019-01-16 ENCOUNTER — Other Ambulatory Visit: Payer: Self-pay

## 2019-01-16 DIAGNOSIS — R1032 Left lower quadrant pain: Secondary | ICD-10-CM | POA: Diagnosis present

## 2019-01-16 DIAGNOSIS — D259 Leiomyoma of uterus, unspecified: Secondary | ICD-10-CM

## 2019-01-16 DIAGNOSIS — R52 Pain, unspecified: Secondary | ICD-10-CM

## 2019-01-16 DIAGNOSIS — Z79899 Other long term (current) drug therapy: Secondary | ICD-10-CM | POA: Insufficient documentation

## 2019-01-16 LAB — URINALYSIS, ROUTINE W REFLEX MICROSCOPIC
Bilirubin Urine: NEGATIVE
Glucose, UA: NEGATIVE mg/dL
Ketones, ur: NEGATIVE mg/dL
Nitrite: NEGATIVE
Protein, ur: NEGATIVE mg/dL
Specific Gravity, Urine: 1.018 (ref 1.005–1.030)
pH: 7 (ref 5.0–8.0)

## 2019-01-16 LAB — PREGNANCY, URINE: Preg Test, Ur: NEGATIVE

## 2019-01-16 MED ORDER — IBUPROFEN 600 MG PO TABS: 600.0000 mg | ORAL_TABLET | Freq: Four times a day (QID) | ORAL | 0 refills | Status: DC | PRN

## 2019-01-16 MED ORDER — HYDROCODONE-ACETAMINOPHEN 5-325 MG PO TABS
2.0000 | ORAL_TABLET | Freq: Once | ORAL | Status: AC
  Administered 2019-01-16: 2 via ORAL
  Filled 2019-01-16: qty 2

## 2019-01-16 NOTE — ED Triage Notes (Signed)
Pain started last night, lower left abdominal pain rating pain 10/10.  Sharp tender pain, history of ovarian cyst.

## 2019-01-16 NOTE — ED Provider Notes (Signed)
[facility] EMERGENCY DEPARTMENT Provider Note   CSN: [id number] Arrival date & time: 01/16/19  1253     History   Chief Complaint Chief Complaint  Patient presents with  . Abdominal Pain    HPI Jessica Sanchez is a 33 y.o. female.     The history is provided by the patient. No language interpreter was used.  Abdominal Pain Pain location:  LLQ Pain quality: aching   Pain severity:  Moderate Onset quality:  Gradual Timing:  Constant Context: not recent illness   Relieved by:  Nothing Worsened by:  Nothing Ineffective treatments:  None tried Associated symptoms: chills   Associated symptoms: no fever   Risk factors: not pregnant   Pt has a left ovarian cyst.  Pt reports she was told that it was large.  Pt has seen her gyn who plans to ultrasound her in November.  Pt reports she began having lower left abdominal pain that has increased today  Past Medical History:  Diagnosis Date  . Fibroid (bleeding) (uterine)   . Fibroid, uterine   . H/O metrorrhagia   . IUD (intrauterine device) in place 07/11/2015  . Menorrhagia   . Obesity, Class III, BMI 40-49.9 (morbid obesity) ([place]) 11/11/2018  . SVT (supraventricular tachycardia) [facility])     Patient Active Problem List   Diagnosis Date Noted  . Gastroesophageal reflux disease   . Nonspecific chest pain 11/11/2018  . Obesity, Class III, BMI 40-49.9 (morbid obesity) ([place]) 11/11/2018  . Elevated d-dimer 11/11/2018  . SVT (supraventricular tachycardia) ([place]) 10/04/2018  . IUD (intrauterine device) in place 07/11/2015  . Other disorder of menstruation and other abnormal bleeding from female genital tract 10/30/2012    Past Surgical History:  Procedure Laterality Date  . HYSTEROSCOPY W/D&C N/A 09/27/2013   Procedure: DILATATION AND CURETTAGE /HYSTEROSCOPY and insertion of Mirena IUD ;  Surgeon: [name]. Harrington Challenger, MD;  Location: [facility];  Service: Gynecology;  Laterality: N/A;  . MYOMECTOMY  02/03/2011   Procedure: MYOMECTOMY;   Surgeon: [name], MD;  Location: [facility];  Service: Gynecology;  Laterality: N/A;  . SVT ABLATION N/A 11/08/2018   Procedure: SVT ABLATION;  Surgeon: [name], MD;  Location: [facility];  Service: Cardiovascular;  Laterality: N/A;  . TONSILLECTOMY       OB History    Gravida  1   Para      Term      Preterm      AB  1   Living        SAB  1   TAB      Ectopic      Multiple      Live Births               Home Medications    Prior to Admission medications   Medication Sig Start Date End Date Taking? Authorizing Provider  albuterol (PROVENTIL HFA) 108 (90 Base) MCG/ACT inhaler Inhale 1-2 puffs into the lungs every 6 (six) hours as needed for wheezing or shortness of breath.     [provider]  amoxicillin (AMOXIL) 500 MG capsule Take 1 capsule (500 mg total) by mouth 3 (three) times daily. 12/29/18   Margarita Mail, PA-C  diltiazem (CARDIZEM CD) 180 MG 24 hr capsule Take 1 capsule (180 mg total) by mouth daily for 21 days. 10/09/18 12/17/18  Langston Masker B, PA-C  famotidine (PEPCID) 20 MG tablet Take 1 tablet (20 mg total) by mouth 2 (two)  times daily. 12/01/18   Fredia Sorrow, MD  levonorgestrel (MIRENA) 20 MCG/24HR IUD 1 each by Intrauterine route once. EVERY 5 YEARS    [provider]  megestrol (MEGACE) 40 MG tablet 1 tablet per day 12/28/18   [name], MD  Multiple Vitamins-Calcium (ONE-A-DAY WOMENS PO) Take 1 tablet by mouth daily.    [provider]  pantoprazole (PROTONIX) 40 MG tablet Take 1 tablet (40 mg total) by mouth daily. 11/12/18 11/12/19  Barton Dubois, MD    Family History Family History  Problem Relation Age of Onset  . Cirrhosis Mother   . Diabetes Mother   . Cancer Maternal Grandfather   . Hypertension Sister   . Diabetes Sister   . Anesthesia problems Neg Hx   . Hypotension Neg Hx   . Malignant hyperthermia Neg Hx   . Pseudochol deficiency Neg Hx     Social History Social History    Tobacco Use  . Smoking status: Never Smoker  . Smokeless tobacco: Never Used  Substance Use Topics  . Alcohol use: Yes    Comment: occasional  . Drug use: No     Allergies   Shellfish allergy and Adhesive [tape]   Review of Systems Review of Systems  Constitutional: Positive for chills. Negative for fever.  Gastrointestinal: Positive for abdominal pain.  All other systems reviewed and are negative.    Physical Exam Updated Vital Signs BP (!) 134/97 (BP Location: Right Arm)   Pulse 90   Temp 99 F (37.2 C) (Oral)   Resp 18   Ht 5\' 2"  (1.575 m)   Wt 131.5 kg   SpO2 99%   BMI 53.04 kg/m   Physical Exam Vitals signs and nursing note reviewed.  Constitutional:      Appearance: She is well-developed.  HENT:     Head: Normocephalic.     Mouth/Throat:     Mouth: Mucous membranes are moist.  Neck:     Musculoskeletal: Normal range of motion.  Cardiovascular:     Rate and Rhythm: Normal rate.     Heart sounds: Normal heart sounds.  Pulmonary:     Effort: Pulmonary effort is normal.  Abdominal:     General: Abdomen is flat. Bowel sounds are normal.     Palpations: Abdomen is soft.     Tenderness: There is abdominal tenderness in the left lower quadrant.  Musculoskeletal: Normal range of motion.  Skin:    General: Skin is warm.  Neurological:     General: No focal deficit present.     Mental Status: She is alert and oriented to person, place, and time.  Psychiatric:        Mood and Affect: Mood normal.      ED Treatments / Results  Labs (all labs ordered are listed, but only abnormal results are displayed) Labs Reviewed  URINALYSIS, ROUTINE W REFLEX MICROSCOPIC - Abnormal; Notable for the following components:      Result Value   APPearance HAZY (*)    Hgb urine dipstick MODERATE (*)    Leukocytes,Ua SMALL (*)    Bacteria, UA RARE (*)    All other components within normal limits  PREGNANCY, URINE    EKG None  Radiology No results found.   Procedures Procedures (including critical care time)  Medications Ordered in ED Medications - No data to display   Initial Impression / Assessment and Plan / ED Course  I have reviewed the triage vital signs and the nursing notes.  Pertinent labs & imaging results that were available during my care of the patient were reviewed by me and considered in my medical decision making (see chart for details).        MDM  Ultrasound shows ovarian cyst has resolved.  Pt does have fibroids.    Final Clinical Impressions(s) / ED Diagnoses   Final diagnoses:  Pain  LLQ abdominal pain  Uterine leiomyoma, unspecified location    ED Discharge Orders         Ordered    ibuprofen (ADVIL) 600 MG tablet  Every 6 hours PRN     01/16/19 1527           Sidney Ace 01/16/19 1528    Noemi Chapel, MD 01/25/19 [phone]

## 2019-01-16 NOTE — Discharge Instructions (Addendum)
Return if any problems.  Follow up with Dr. Elonda Husky for recheck

## 2019-01-18 ENCOUNTER — Telehealth: Payer: Self-pay | Admitting: *Deleted

## 2019-01-18 NOTE — Telephone Encounter (Signed)
-----   Message from Lauralee Evener, [place] sent at 01/18/2019 11:07 AM EDT ----- Regarding: RE: precert Chart doesn't show patient has insurance. Please call to confirm. ----- Message ----- From: Freada Bergeron, CMA Sent: 01/01/2019   4:53 PM EDT To: [facility] Sleep Studies Subject: precert                                        Split night

## 2019-01-18 NOTE — Telephone Encounter (Addendum)
Reached out to patient and she states she realizes she may have to self pay and she ask what the sleep study price may be and if she can be billed. Patient was informed she can be billed after the sleep study.  we cannot verify insurance and that we tried multiple times to get insurance information from patient. she going to check with her job about the insurance because the hospital said the same thing when she went the the ER a few days ago

## 2019-01-19 ENCOUNTER — Telehealth: Payer: Self-pay | Admitting: *Deleted

## 2019-01-19 NOTE — Telephone Encounter (Signed)
Patient is scheduled for lab study on 01/29/19. Jessica Sanchez is scheduled for COVID screening on 9/25 prior to SS. Patient understands her sleep study will be done at [facility]. Patient understands she will receive a sleep packet in a week or so. Patient understands to call if she does not receive the sleep packet in a timely manner. Patient agrees with treatment and thanked me for call.

## 2019-01-19 NOTE — Telephone Encounter (Signed)
Staff message sent to [employer] cannot be verified. All we have is a debit card, which I think acts as a healthcare savings type of thing.

## 2019-01-19 NOTE — Telephone Encounter (Signed)
-----   Message from Freada Bergeron, [place] sent at 01/18/2019  2:43 PM EDT ----- Regarding: RE: precert  ----- Message ----- From: Lauralee Evener, CMA Sent: 01/18/2019  11:07 AM EDT To: Freada Bergeron, CMA Subject: RE: precert                                    Chart doesn't show patient has insurance. Please call to confirm. ----- Message ----- From: Freada Bergeron, CMA Sent: 01/01/2019   4:53 PM EDT To: [facility] Sleep Studies Subject: precert                                        Split night

## 2019-01-26 ENCOUNTER — Other Ambulatory Visit (HOSPITAL_COMMUNITY)
Admission: RE | Admit: 2019-01-26 | Discharge: 2019-01-26 | Disposition: A | Discharge status: Home / Self Care | Payer: No Typology Code available for payment source | Source: Ambulatory Visit | Attending: Cardiology | Admitting: Cardiology

## 2019-01-26 DIAGNOSIS — Z20828 Contact with and (suspected) exposure to other viral communicable diseases: Secondary | ICD-10-CM | POA: Insufficient documentation

## 2019-01-26 DIAGNOSIS — Z01812 Encounter for preprocedural laboratory examination: Secondary | ICD-10-CM | POA: Diagnosis present

## 2019-01-26 LAB — SARS CORONAVIRUS 2 (TAT 6-24 HRS): SARS Coronavirus 2: NEGATIVE

## 2019-01-29 ENCOUNTER — Ambulatory Visit: Payer: No Typology Code available for payment source | Attending: Internal Medicine | Admitting: Cardiology

## 2019-01-29 ENCOUNTER — Other Ambulatory Visit: Payer: Self-pay

## 2019-01-29 DIAGNOSIS — R400 Somnolence: Secondary | ICD-10-CM

## 2019-01-29 DIAGNOSIS — R5383 Other fatigue: Secondary | ICD-10-CM | POA: Diagnosis not present

## 2019-01-29 DIAGNOSIS — G4733 Obstructive sleep apnea (adult) (pediatric): Secondary | ICD-10-CM

## 2019-01-31 ENCOUNTER — Telehealth: Payer: Self-pay | Admitting: Internal Medicine

## 2019-01-31 NOTE — Telephone Encounter (Signed)
I will forward to Dr Lovena Le for instructions

## 2019-02-01 NOTE — Telephone Encounter (Signed)
Yes once we know the results and recommendations. GT

## 2019-02-02 NOTE — Telephone Encounter (Signed)
Patient made aware.

## 2019-02-11 NOTE — Procedures (Signed)
Patient Name: Jessica Sanchez, Jessica Sanchez Date: 01/29/2019 Gender: Female D.O.B: 08-30-1985 Age (years): 33 Referring Provider: [name] Height (inches): 3 Interpreting Physician: Fransico Him MD, ABSM Weight (lbs): 299 RPSGT: Rosebud Poles BMI: 55 MRN: [id number] Neck Size: 17.50  CLINICAL INFORMATION Sleep Study Type: Split Night CPAP  Indication for sleep study: Fatigue  Epworth Sleepiness Score: 6  SLEEP STUDY TECHNIQUE As per the AASM Manual for the Scoring of Sleep and Associated Events v2.3 (April 2016) with a hypopnea requiring 4% desaturations.  The channels recorded and monitored were frontal, central and occipital EEG, electrooculogram (EOG), submentalis EMG (chin), nasal and oral airflow, thoracic and abdominal wall motion, anterior tibialis EMG, snore microphone, electrocardiogram, and pulse oximetry. Continuous positive airway pressure (CPAP) was initiated when the patient met split night criteria and was titrated according to treat sleep-disordered breathing.  MEDICATIONS Medications self-administered by patient taken the night of the study : N/A  RESPIRATORY PARAMETERS Diagnostic Total AHI (/hr): 73.0  RDI (/hr):88.9  OA Index (/hr): 10.2  CA Index (/hr): 1.0 REM AHI (/hr): 138.2  NREM AHI (/hr):66.8  Supine AHI (/hr):N/A  Non-supine AHI (/hr):73 Min O2 Sat (%):75.0  Mean O2 (%): 93.6  Time below 88% (min):9.2   Titration Optimal Pressure (cm):11  AHI at Optimal Pressure (/hr):0  Min O2 at Optimal Pressure (%):90.0 Supine % at Optimal (%):N/A  Sleep % at Optimal (%):N/A   SLEEP ARCHITECTURE The recording time for the entire night was 436.9 minutes.  During a baseline period of 223.4 minutes, the patient slept for 189.0 minutes in REM and nonREM, yielding a sleep efficiency of 84.6%. Sleep onset after lights out was 13.3 minutes with a REM latency of 115.5 minutes. The patient spent 7.7% of the night in stage N1 sleep, 82.0% in stage N2 sleep, 1.6%  in stage N3 and 8.7% in REM.  During the titration period of 203.5 minutes, the patient slept for 155.5 minutes in REM and nonREM, yielding a sleep efficiency of 76.4%. Sleep onset after CPAP initiation was 19.9 minutes with a REM latency of 126.0 minutes. The patient spent 7.4% of the night in stage N1 sleep, 23.5% in stage N2 sleep, 32.2% in stage N3 and 37% in REM.  CARDIAC DATA The 2 lead EKG demonstrated sinus rhythm. The mean heart rate was 100.0 beats per minute.  LEG MOVEMENT DATA The total Periodic Limb Movements of Sleep (PLMS) were 0. The PLMS index was 0.0 .  IMPRESSIONS - Severe obstructive sleep apnea occurred during the diagnostic portion of the study (AHI = 73.0/hour). An optimal PAP pressure of 11cm H2O was selected for this patient. - No significant central sleep apnea occurred during the diagnostic portion of the study (CAI = 1.0/hour). - Oxygen desaturation was noted during the diagnostic portion of the study (Min O2 = 75.0%). - The patient snored with loud snoring volume during the diagnostic portion of the study. - Clinically significant periodic limb movements did not occur during sleep.  DIAGNOSIS - Obstructive Sleep Apnea (327.23 [G47.33 ICD-10]) Nocturnal Hypoxemia  RECOMMENDATIONS - Recommend  CPAP at 11cm H2O with heated humidity and mask of choice. - Avoid alcohol, sedatives and other CNS depressants that may worsen sleep apnea and disrupt normal sleep architecture. - Sleep hygiene should be reviewed to assess factors that may improve sleep quality. - Weight management and regular exercise should be initiated or continued. - Return to Sleep Center for re-evaluation after 10 weeks of therapy  [Electronically signed] 02/11/2019 06:52 PM  Fransico Him MD,  ABSM Diplomate, Tax adviser of Sleep Medicine

## 2019-02-12 ENCOUNTER — Telehealth: Payer: Self-pay | Admitting: *Deleted

## 2019-02-12 NOTE — Telephone Encounter (Signed)
-----   Message from Sueanne Margarita, MD sent at 02/11/2019  6:56 PM EDT ----- Please let patient know that they have significant sleep apnea and had successful PAP titration and will be set up with PAP unit.  Please let DME know that order is in EPIC.  Please set patient up for OV in 10 weeks

## 2019-02-13 NOTE — Telephone Encounter (Signed)
Informed patient of sleep study results and patient understanding was verbalized. Patient understands her sleep study showed they have significant sleep apnea and had successful PAP titration and will be set up with PAP unit. Please let DME know that order is in EPIC. Please set patient up for OV in 10 weeks   Upon patient request DME selection is  [facility]. Patient understands she will be contacted by [facility] to set up her cpap. Patient understands to call if [facility] does not contact her with new setup in a timely manner. Patient understands they will be called once confirmation has been received from [facility] that they have received their new machine to schedule 10 week follow up appointment.  [facility] notified of new cpap order  Please add to airview Patient was grateful for the call and thanked me. 

## 2019-02-16 ENCOUNTER — Telehealth: Payer: Self-pay | Admitting: Internal Medicine

## 2019-02-16 NOTE — Telephone Encounter (Signed)
Patient would like return phone call to discuss symptoms post SVT / tg

## 2019-02-16 NOTE — Telephone Encounter (Signed)
Called pt. No answer. Voicemail full

## 2019-02-19 NOTE — Telephone Encounter (Signed)
Called pt. Voicemail is full. Sent a message to pt on MyChart.

## 2019-02-21 ENCOUNTER — Other Ambulatory Visit: Payer: Self-pay

## 2019-02-21 ENCOUNTER — Emergency Department (HOSPITAL_COMMUNITY): Payer: No Typology Code available for payment source

## 2019-02-21 ENCOUNTER — Emergency Department (HOSPITAL_COMMUNITY)
Admission: EM | Admit: 2019-02-21 | Discharge: 2019-02-21 | Disposition: A | Discharge status: Home / Self Care | Payer: No Typology Code available for payment source | Attending: Emergency Medicine | Admitting: Emergency Medicine

## 2019-02-21 ENCOUNTER — Encounter (HOSPITAL_COMMUNITY): Payer: Self-pay | Admitting: Emergency Medicine

## 2019-02-21 DIAGNOSIS — R079 Chest pain, unspecified: Secondary | ICD-10-CM

## 2019-02-21 DIAGNOSIS — R0981 Nasal congestion: Secondary | ICD-10-CM | POA: Diagnosis not present

## 2019-02-21 DIAGNOSIS — R0789 Other chest pain: Secondary | ICD-10-CM | POA: Diagnosis present

## 2019-02-21 LAB — BASIC METABOLIC PANEL
Anion gap: 10 (ref 5–15)
BUN: 9 mg/dL (ref 6–20)
CO2: 20 mmol/L — ABNORMAL LOW (ref 22–32)
Calcium: 8.8 mg/dL — ABNORMAL LOW (ref 8.9–10.3)
Chloride: 105 mmol/L (ref 98–111)
Creatinine, Ser: 0.63 mg/dL (ref 0.44–1.00)
GFR calc Af Amer: >60 mL/min (ref >60)
GFR calc non Af Amer: >60 mL/min (ref >60)
Glucose, Bld: 88 mg/dL (ref 70–99)
Potassium: 4.4 mmol/L (ref 3.5–5.1)
Sodium: 135 mmol/L (ref 135–145)

## 2019-02-21 LAB — I-STAT BETA HCG BLOOD, ED (MC, WL, AP ONLY): I-stat hCG, quantitative: <5 m[IU]/mL (ref <5)

## 2019-02-21 LAB — TROPONIN I (HIGH SENSITIVITY)
Troponin I (High Sensitivity): <2 ng/L (ref <18)
Troponin I (High Sensitivity): <2 ng/L (ref <18)

## 2019-02-21 MED ORDER — SODIUM CHLORIDE 0.9% FLUSH: 3.0000 mL | Freq: Once | INTRAVENOUS | Status: DC

## 2019-02-21 NOTE — Discharge Instructions (Signed)
Make sure you use your Flonase nasal spray 1 in each nostril twice a day for 2 to 3 weeks.  Also may help to take a antihistamine such as Zyrtec, or Claritin, 1 each day.  For the chest discomfort take Tylenol 650 mg every 4 hours as needed.  Make sure that you are drinking plenty of fluids, especially water.

## 2019-02-21 NOTE — ED Triage Notes (Signed)
Pt reports CP since last night and body aches. Endorses nasal congestion. Reports decreased appetite.

## 2019-02-21 NOTE — ED Provider Notes (Signed)
[facility] EMERGENCY DEPARTMENT Provider Note   CSN: [id number] Arrival date & time: 02/21/19  1453     History   Chief Complaint Chief Complaint  Patient presents with  . Chest Pain    HPI Jessica Sanchez is a 33 y.o. female.     HPI She presents for evaluation of chest pain, present for about 2 days, low level, mild.  She also complains of dry mouth, and nasal congestion.  She was recently diagnosed with sleep apnea, and is working toward getting CPAP.  She typically has seasonal allergy symptoms this time a year.  She tried using Flonase, 2 doses without change in her symptoms.  She denies fever, chills, shortness of breath, cough, nausea or vomiting.  She has had decreased appetite today.  She denies loss of smell or taste.  She denies known exposure to COVID-19.  There are no other known modifying factors. \  Past Medical History:  Diagnosis Date  . Fibroid (bleeding) (uterine)   . Fibroid, uterine   . H/O metrorrhagia   . IUD (intrauterine device) in place 07/11/2015  . Menorrhagia   . Obesity, Class III, BMI 40-49.9 (morbid obesity) ([place]) 11/11/2018  . SVT (supraventricular tachycardia) [facility])     Patient Active Problem List   Diagnosis Date Noted  . Gastroesophageal reflux disease   . Nonspecific chest pain 11/11/2018  . Obesity, Class III, BMI 40-49.9 (morbid obesity) ([place]) 11/11/2018  . Elevated d-dimer 11/11/2018  . SVT (supraventricular tachycardia) ([place]) 10/04/2018  . IUD (intrauterine device) in place 07/11/2015  . Other disorder of menstruation and other abnormal bleeding from female genital tract 10/30/2012    Past Surgical History:  Procedure Laterality Date  . HYSTEROSCOPY W/D&C N/A 09/27/2013   Procedure: DILATATION AND CURETTAGE /HYSTEROSCOPY and insertion of Mirena IUD ;  Surgeon: [name]. Harrington Challenger, MD;  Location: [facility] [facility];  Service: Gynecology;  Laterality: N/A;  . MYOMECTOMY  02/03/2011   Procedure: MYOMECTOMY;  Surgeon: [name],  MD;  Location: [facility];  Service: Gynecology;  Laterality: N/A;  . SVT ABLATION N/A 11/08/2018   Procedure: SVT ABLATION;  Surgeon: [name], MD;  Location: [facility];  Service: Cardiovascular;  Laterality: N/A;  . TONSILLECTOMY       OB History    Gravida  1   Para      Term      Preterm      AB  1   Living        SAB  1   TAB      Ectopic      Multiple      Live Births               Home Medications    Prior to Admission medications   Medication Sig Start Date End Date Taking? Authorizing Provider  albuterol (PROVENTIL HFA) 108 (90 Base) MCG/ACT inhaler Inhale 1-2 puffs into the lungs every 6 (six) hours as needed for wheezing or shortness of breath.     [provider]  amoxicillin (AMOXIL) 500 MG capsule Take 1 capsule (500 mg total) by mouth 3 (three) times daily. 12/29/18   Margarita Mail, PA-C  diltiazem (CARDIZEM CD) 180 MG 24 hr capsule Take 1 capsule (180 mg total) by mouth daily for 21 days. 10/09/18 12/17/18  Langston Masker B, PA-C  famotidine (PEPCID) 20 MG tablet Take 1 tablet (20 mg total) by mouth 2 (two) times daily. 12/01/18   Zackowski,  Nicki Reaper, MD  ibuprofen (ADVIL) 600 MG tablet Take 1 tablet (600 mg total) by mouth every 6 (six) hours as needed. 01/16/19   Fransico Meadow, PA-C  levonorgestrel (MIRENA) 20 MCG/24HR IUD 1 each by Intrauterine route once. EVERY 5 YEARS    [provider]  megestrol (MEGACE) 40 MG tablet 1 tablet per day 12/28/18   [name], MD  Multiple Vitamins-Calcium (ONE-A-DAY WOMENS PO) Take 1 tablet by mouth daily.    [provider]  pantoprazole (PROTONIX) 40 MG tablet Take 1 tablet (40 mg total) by mouth daily. 11/12/18 11/12/19  Barton Dubois, MD    Family History Family History  Problem Relation Age of Onset  . Cirrhosis Mother   . Diabetes Mother   . Cancer Maternal Grandfather   . Hypertension Sister   . Diabetes Sister   . Anesthesia problems Neg Hx   . Hypotension Neg Hx    . Malignant hyperthermia Neg Hx   . Pseudochol deficiency Neg Hx     Social History Social History   Tobacco Use  . Smoking status: Never Smoker  . Smokeless tobacco: Never Used  Substance Use Topics  . Alcohol use: Yes    Comment: occasional  . Drug use: No     Allergies   Shellfish allergy and Adhesive [tape]   Review of Systems Review of Systems  All other systems reviewed and are negative.    Physical Exam Updated Vital Signs BP 105/62   Pulse 96   Temp 98.7 F (37.1 C) (Oral)   Resp (!) 23   LMP 02/19/2019   SpO2 97%   Physical Exam Vitals signs and nursing note reviewed.  Constitutional:      General: She is not in acute distress.    Appearance: She is well-developed. She is obese. She is not ill-appearing, toxic-appearing or diaphoretic.  HENT:     Head: Normocephalic and atraumatic.     Right Ear: External ear normal.     Left Ear: External ear normal.     Nose: Congestion present. No rhinorrhea.     Mouth/Throat:     Mouth: Mucous membranes are moist.     Pharynx: No oropharyngeal exudate or posterior oropharyngeal erythema.  Eyes:     Conjunctiva/sclera: Conjunctivae normal.     Pupils: Pupils are equal, round, and reactive to light.  Neck:     Musculoskeletal: Normal range of motion and neck supple.     Trachea: Phonation normal.  Cardiovascular:     Rate and Rhythm: Normal rate and regular rhythm.     Heart sounds: Normal heart sounds.  Pulmonary:     Effort: Pulmonary effort is normal. No respiratory distress.     Breath sounds: Normal breath sounds. No stridor. No wheezing or rhonchi.  Chest:     Chest wall: No tenderness.  Abdominal:     General: There is no distension.     Palpations: Abdomen is soft.     Tenderness: There is no abdominal tenderness.  Musculoskeletal: Normal range of motion.  Skin:    General: Skin is warm and dry.  Neurological:     Mental Status: She is alert and oriented to person, place, and time.      Cranial Nerves: No cranial nerve deficit.     Sensory: No sensory deficit.     Motor: No abnormal muscle tone.     Coordination: Coordination normal.  Psychiatric:        Mood and Affect: Mood normal.  Behavior: Behavior normal.        Thought Content: Thought content normal.        Judgment: Judgment normal.      ED Treatments / Results  Labs (all labs ordered are listed, but only abnormal results are displayed) Labs Reviewed  BASIC METABOLIC PANEL - Abnormal; Notable for the following components:      Result Value   CO2 20 (*)    Calcium 8.8 (*)    All other components within normal limits  I-STAT BETA HCG BLOOD, ED ([facility], [facility], [facility] ONLY)  TROPONIN I (HIGH SENSITIVITY)  TROPONIN I (HIGH SENSITIVITY)    EKG EKG Interpretation  Date/Time:  Wednesday February 21 2019 15:03:30 EDT Ventricular Rate:  98 PR Interval:  124 QRS Duration: 76 QT Interval:  346 QTC Calculation: 441 R Axis:   -3 Text Interpretation:  Normal sinus rhythm Nonspecific T wave abnormality Abnormal ECG since last tracing no significant change Confirmed by Daleen Bo [id number]) on 02/21/2019 9:01:21 PM   Radiology Dg Chest 2 View  Result Date: 02/21/2019 CLINICAL DATA:  Chest pain, weakness and dizziness for 2-3 days. EXAM: CHEST - 2 VIEW COMPARISON:  12/01/2018 FINDINGS: The cardiac silhouette, mediastinal and hilar contours are within normal limits. The lungs are clear. No pleural effusion. No worrisome pulmonary lesions. The bony thorax is intact. IMPRESSION: No acute cardiopulmonary findings. Electronically Signed   By: Marijo Sanes M.D.   On: 02/21/2019 15:56    Procedures Procedures (including critical care time)  Medications Ordered in ED Medications  sodium chloride flush (NS) 0.9 % injection 3 mL (has no administration in time range)     Initial Impression / Assessment and Plan / ED Course  I have reviewed the triage vital signs and the nursing notes.  Pertinent labs & imaging  results that were available during my care of the patient were reviewed by me and considered in my medical decision making (see chart for details).  Clinical Course as of Feb 21 2135  Wed Feb 21, 2019  2135 Normal delta troponin unchanged from prior  Troponin I (High Sensitivity) [EW]  2135 Normal except CO2 low, calcium low  Basic metabolic panel(!) [EW]  [id number] Normal  I-Stat beta hCG blood, ED [EW]  2135 No infiltrate or CHF, images interpreted by me  DG Chest 2 View [EW]    Clinical Course User Index [EW] Daleen Bo, MD        Patient Vitals for the past 24 hrs:  BP Temp Temp src Pulse Resp SpO2  02/21/19 2119 - 98.7 F (37.1 C) Oral - - -  02/21/19 2115 105/62 - - 96 (!) 23 97 %  02/21/19 2111 - - - (!) 102 (!) 21 100 %  02/21/19 2102 116/76 - - 99 - 99 %  02/21/19 1851 126/80 - - 92 16 98 %  02/21/19 1501 111/63 98.7 F (37.1 C) Oral 100 18 99 %    9:36 PM Reevaluation with update and discussion. After initial assessment and treatment, an updated evaluation reveals no change in clinical status, findings discussed with the patient and all questions were answered. Daleen Bo   Medical Decision Making: Nonspecific symptoms, doubt ACS, PE or pneumonia.  Evaluation ED is very reassuring.  No indication for further evaluation treatment this time.  Suspect allergic rhinitis.  Nonspecific chest pain may be related to sleep apnea, and does not require additional evaluation at this time.  Jessica Sanchez was evaluated in Emergency Department on 02/21/2019  for the symptoms described in the history of present illness. She was evaluated in the context of the global COVID-19 pandemic, which necessitated consideration that the patient might be at risk for infection with the SARS-CoV-2 virus that causes COVID-19. Institutional protocols and algorithms that pertain to the evaluation of patients at risk for COVID-19 are in a state of rapid change based on information released by regulatory  bodies including the CDC and federal and state organizations. These policies and algorithms were followed during the patient's care in the ED.  CRITICAL CARE-no Performed by: Daleen Bo   Nursing Notes Reviewed/ Care Coordinated Applicable Imaging Reviewed Interpretation of Laboratory Data incorporated into ED treatment  The patient appears reasonably screened and/or stabilized for discharge and I doubt any other medical condition or other [facility] requiring further screening, evaluation, or treatment in the ED at this time prior to discharge.  Plan: Home Medications-continue current, use Claritin and take Flonase regularly; Home Treatments-rest, fluids; return here if the recommended treatment, does not improve the symptoms; Recommended follow up-PCP, as needed    Final Clinical Impressions(s) / ED Diagnoses   Final diagnoses:  Nasal congestion  Nonspecific chest pain    ED Discharge Orders    None       Daleen Bo, MD 02/21/19 2136

## 2019-02-21 NOTE — ED Notes (Signed)
Discharge instructions discussed with pt. Pt verbalized understanding. Pt stable and ambulatory. No signature pad available. 

## 2019-02-23 ENCOUNTER — Other Ambulatory Visit: Payer: Self-pay

## 2019-02-23 ENCOUNTER — Encounter (HOSPITAL_COMMUNITY): Payer: Self-pay | Admitting: *Deleted

## 2019-02-23 DIAGNOSIS — Z79899 Other long term (current) drug therapy: Secondary | ICD-10-CM | POA: Diagnosis not present

## 2019-02-23 DIAGNOSIS — R002 Palpitations: Secondary | ICD-10-CM | POA: Diagnosis not present

## 2019-02-23 DIAGNOSIS — R079 Chest pain, unspecified: Secondary | ICD-10-CM | POA: Diagnosis present

## 2019-02-23 DIAGNOSIS — R0789 Other chest pain: Secondary | ICD-10-CM | POA: Diagnosis not present

## 2019-02-23 DIAGNOSIS — R0602 Shortness of breath: Secondary | ICD-10-CM | POA: Diagnosis not present

## 2019-02-23 NOTE — ED Triage Notes (Signed)
Pt with sob and cp and palpitations while at work an hour ago.

## 2019-02-24 ENCOUNTER — Emergency Department (HOSPITAL_COMMUNITY)
Admission: EM | Admit: 2019-02-24 | Discharge: 2019-02-24 | Disposition: A | Discharge status: Home / Self Care | Payer: No Typology Code available for payment source | Attending: Emergency Medicine | Admitting: Emergency Medicine

## 2019-02-24 ENCOUNTER — Emergency Department (HOSPITAL_COMMUNITY): Payer: No Typology Code available for payment source

## 2019-02-24 DIAGNOSIS — R0789 Other chest pain: Secondary | ICD-10-CM

## 2019-02-24 LAB — BASIC METABOLIC PANEL
Anion gap: 10 (ref 5–15)
BUN: 14 mg/dL (ref 6–20)
CO2: 24 mmol/L (ref 22–32)
Calcium: 9.1 mg/dL (ref 8.9–10.3)
Chloride: 105 mmol/L (ref 98–111)
Creatinine, Ser: 0.73 mg/dL (ref 0.44–1.00)
GFR calc Af Amer: >60 mL/min (ref >60)
GFR calc non Af Amer: >60 mL/min (ref >60)
Glucose, Bld: 103 mg/dL — ABNORMAL HIGH (ref 70–99)
Potassium: 4 mmol/L (ref 3.5–5.1)
Sodium: 139 mmol/L (ref 135–145)

## 2019-02-24 LAB — CBC
HCT: 40.8 % (ref 36.0–46.0)
Hemoglobin: 12.3 g/dL (ref 12.0–15.0)
MCH: 26.6 pg (ref 26.0–34.0)
MCHC: 30.1 g/dL (ref 30.0–36.0)
MCV: 88.3 fL (ref 80.0–100.0)
Platelets: 332 10*3/uL (ref 150–400)
RBC: 4.62 MIL/uL (ref 3.87–5.11)
RDW: 14.9 % (ref 11.5–15.5)
WBC: 6.7 10*3/uL (ref 4.0–10.5)
nRBC: 0 % (ref 0.0–0.2)

## 2019-02-24 LAB — D-DIMER, QUANTITATIVE: D-Dimer, Quant: 0.45 ug/mL-FEU (ref 0.00–0.50)

## 2019-02-24 LAB — TROPONIN I (HIGH SENSITIVITY): Troponin I (High Sensitivity): <2 ng/L (ref <18)

## 2019-02-24 MED ORDER — LIDOCAINE VISCOUS HCL 2 % MT SOLN
15.0000 mL | Freq: Once | OROMUCOSAL | Status: AC
  Administered 2019-02-24: 05:00:00 15 mL via ORAL
  Filled 2019-02-24: qty 15

## 2019-02-24 MED ORDER — ALUM & MAG HYDROXIDE-SIMETH 200-200-20 MG/5ML PO SUSP
30.0000 mL | Freq: Once | ORAL | Status: AC
  Administered 2019-02-24: 30 mL via ORAL
  Filled 2019-02-24: qty 30

## 2019-02-24 MED ORDER — OMEPRAZOLE 20 MG PO CPDR: 20.0000 mg | DELAYED_RELEASE_CAPSULE | Freq: Every day | ORAL | 0 refills | Status: DC

## 2019-02-24 NOTE — ED Notes (Signed)
Pt snoring, nurse to ask pt how she is doing, pt awoke with touch to shoulder, asked pt was she feeling better, pt says she is feeling a little better.

## 2019-02-24 NOTE — ED Notes (Signed)
Patient asleep on stretcher in Hallway 2. Patient responds to verbal stimuli.  Patient vitals are within normal limits. Patient has equal rise and fall of chest, equal respirations.

## 2019-02-24 NOTE — ED Notes (Signed)
EKG completed in triage.

## 2019-02-24 NOTE — ED Notes (Signed)
This nurse walking with pt from lobby to bed [address], asked pt, "so this started while you were at work?" pt says, "yes that is why I'm still here, I need a work note."

## 2019-02-24 NOTE — Discharge Instructions (Signed)
Your testing is reassuring.  Take the stomach medication as prescribed.  Follow-up with your doctor.  Avoid alcohol, caffeine, NSAIDs, spicy foods.  Return to the ED for chest pain, exertional, associated with shortness of breath, vomiting, sweating or other concerns.

## 2019-02-24 NOTE — ED Provider Notes (Signed)
[facility] EMERGENCY DEPARTMENT Provider Note   CSN: [id number] Arrival date & time: 02/23/19  2141     History   Chief Complaint Chief Complaint  Patient presents with  . Chest Pain    HPI Jessica Sanchez is a 33 y.o. female.     Patient with history of obesity, SVT, metrorrhagia presenting with episode of chest pain that onset while she was at work around 6:30 PM.  She describes pain that is sharp to the left side of her chest associated with palpitations and shortness of breath.  She denies any radiation of the pain.  The pain is somewhat worse with palpation and movement.  She states she had this pain earlier in the week but did not last this long.  She feels some shortness of breath but no nausea or vomiting.  No pain in her back.  Has had this pain in the past that has been attributed to GI origin.  Denies ever having a heart attack or any stents in her heart.  No leg pain or leg swelling.  No abdominal pain, nausea or vomiting.  No fever.  The history is provided by the patient.  Chest Pain Associated symptoms: shortness of breath   Associated symptoms: no abdominal pain, no dizziness, no fever, no headache, no nausea, no vomiting and no weakness     Past Medical History:  Diagnosis Date  . Fibroid (bleeding) (uterine)   . Fibroid, uterine   . H/O metrorrhagia   . IUD (intrauterine device) in place 07/11/2015  . Menorrhagia   . Obesity, Class III, BMI 40-49.9 (morbid obesity) ([place]) 11/11/2018  . SVT (supraventricular tachycardia) [facility])     Patient Active Problem List   Diagnosis Date Noted  . Gastroesophageal reflux disease   . Nonspecific chest pain 11/11/2018  . Obesity, Class III, BMI 40-49.9 (morbid obesity) ([place]) 11/11/2018  . Elevated d-dimer 11/11/2018  . SVT (supraventricular tachycardia) ([place]) 10/04/2018  . IUD (intrauterine device) in place 07/11/2015  . Other disorder of menstruation and other abnormal bleeding from female genital tract 10/30/2012     Past Surgical History:  Procedure Laterality Date  . HYSTEROSCOPY W/D&C N/A 09/27/2013   Procedure: DILATATION AND CURETTAGE /HYSTEROSCOPY and insertion of Mirena IUD ;  Surgeon: [name]. Harrington Challenger, MD;  Location: [facility];  Service: Gynecology;  Laterality: N/A;  . MYOMECTOMY  02/03/2011   Procedure: MYOMECTOMY;  Surgeon: [name], MD;  Location: [facility];  Service: Gynecology;  Laterality: N/A;  . SVT ABLATION N/A 11/08/2018   Procedure: SVT ABLATION;  Surgeon: [name], MD;  Location: [place];  Service: Cardiovascular;  Laterality: N/A;  . TONSILLECTOMY       OB History    Gravida  1   Para      Term      Preterm      AB  1   Living        SAB  1   TAB      Ectopic      Multiple      Live Births               Home Medications    Prior to Admission medications   Medication Sig Start Date End Date Taking? Authorizing Provider  albuterol (PROVENTIL HFA) 108 (90 Base) MCG/ACT inhaler Inhale 1-2 puffs into the lungs every 6 (six) hours as needed for wheezing or shortness of breath.     [provider]  amoxicillin (AMOXIL) 500 MG capsule Take 1 capsule (500 mg total) by mouth 3 (three) times daily. 12/29/18   Margarita Mail, PA-C  diltiazem (CARDIZEM CD) 180 MG 24 hr capsule Take 1 capsule (180 mg total) by mouth daily for 21 days. 10/09/18 12/17/18  Langston Masker B, PA-C  famotidine (PEPCID) 20 MG tablet Take 1 tablet (20 mg total) by mouth 2 (two) times daily. 12/01/18   Fredia Sorrow, MD  ibuprofen (ADVIL) 600 MG tablet Take 1 tablet (600 mg total) by mouth every 6 (six) hours as needed. 01/16/19   Fransico Meadow, PA-C  levonorgestrel (MIRENA) 20 MCG/24HR IUD 1 each by Intrauterine route once. EVERY 5 YEARS    [provider]  megestrol (MEGACE) 40 MG tablet 1 tablet per day 12/28/18   [name], MD  Multiple Vitamins-Calcium (ONE-A-DAY WOMENS PO) Take 1 tablet by mouth daily.    [provider]  pantoprazole (PROTONIX)  40 MG tablet Take 1 tablet (40 mg total) by mouth daily. 11/12/18 11/12/19  Barton Dubois, MD    Family History Family History  Problem Relation Age of Onset  . Cirrhosis Mother   . Diabetes Mother   . Cancer Maternal Grandfather   . Hypertension Sister   . Diabetes Sister   . Anesthesia problems Neg Hx   . Hypotension Neg Hx   . Malignant hyperthermia Neg Hx   . Pseudochol deficiency Neg Hx     Social History Social History   Tobacco Use  . Smoking status: Never Smoker  . Smokeless tobacco: Never Used  Substance Use Topics  . Alcohol use: Yes    Comment: occasional  . Drug use: No     Allergies   Shellfish allergy and Adhesive [tape]   Review of Systems Review of Systems  Constitutional: Negative for activity change, appetite change and fever.  HENT: Negative for congestion and rhinorrhea.   Respiratory: Positive for chest tightness and shortness of breath.   Cardiovascular: Positive for chest pain.  Gastrointestinal: Negative for abdominal pain, nausea and vomiting.  Genitourinary: Negative for dysuria and hematuria.  Musculoskeletal: Negative for arthralgias and myalgias.  Neurological: Negative for dizziness, weakness and headaches.    all other systems are negative except as noted in the HPI and PMH.    Physical Exam Updated Vital Signs BP 97/75 (BP Location: Right Arm)   Pulse 96   Temp 98.1 F (36.7 C) (Oral)   Resp 20   Ht 5\' 2"  (1.575 m)   Wt 124.7 kg   LMP 02/19/2019   SpO2 99%   BMI 50.30 kg/m   Physical Exam Vitals signs and nursing note reviewed.  Constitutional:      General: She is not in acute distress.    Appearance: She is well-developed. She is obese.  HENT:     Head: Normocephalic and atraumatic.     Mouth/Throat:     Pharynx: No oropharyngeal exudate.  Eyes:     Conjunctiva/sclera: Conjunctivae normal.     Pupils: Pupils are equal, round, and reactive to light.  Neck:     Musculoskeletal: Normal range of motion and neck  supple.     Comments: No meningismus. Cardiovascular:     Rate and Rhythm: Normal rate and regular rhythm.     Heart sounds: Normal heart sounds. No murmur.  Pulmonary:     Effort: Pulmonary effort is normal. No respiratory distress.     Breath sounds: Normal breath sounds.     Comments: Somewhat reproducible  chest wall tenderness Chest:     Chest wall: Tenderness present.  Abdominal:     Palpations: Abdomen is soft.     Tenderness: There is no abdominal tenderness. There is no guarding or rebound.  Musculoskeletal: Normal range of motion.        General: No tenderness.  Skin:    General: Skin is warm.     Capillary Refill: Capillary refill takes less than 2 seconds.  Neurological:     General: No focal deficit present.     Mental Status: She is alert and oriented to person, place, and time. Mental status is at baseline.     Cranial Nerves: No cranial nerve deficit.     Motor: No abnormal muscle tone.     Coordination: Coordination normal.     Comments:  5/5 strength throughout. CN 2-12 intact.Equal grip strength.   Psychiatric:        Behavior: Behavior normal.      ED Treatments / Results  Labs (all labs ordered are listed, but only abnormal results are displayed) Labs Reviewed  BASIC METABOLIC PANEL - Abnormal; Notable for the following components:      Result Value   Glucose, Bld 103 (*)    All other components within normal limits  CBC  D-DIMER, QUANTITATIVE (NOT AT [facility])  POC URINE PREG, ED  TROPONIN I (HIGH SENSITIVITY)    EKG None   ED ECG REPORT   Date: 02/24/2019  Rate: 97  Rhythm: sinus  QRS Axis: normal  Intervals: normal  ST/T Wave abnormalities: septal T wave inversions  Conduction Disutrbances:none  Narrative Interpretation:   Old EKG Reviewed: yes  I have personally reviewed the EKG tracing and agree with the computerized printout as noted.   Radiology Dg Chest 2 View  Result Date: 02/24/2019 CLINICAL DATA:  Chest pain and shortness  of breath EXAM: CHEST - 2 VIEW COMPARISON:  February 21, 2019 FINDINGS: The heart size and mediastinal contours are within normal limits. Both lungs are clear. The visualized skeletal structures are unremarkable. IMPRESSION: No acute cardiopulmonary process. Electronically Signed   By: Prudencio Pair M.D.   On: 02/24/2019 01:59    Procedures Procedures (including critical care time)  Medications Ordered in ED Medications  alum & mag hydroxide-simeth (MAALOX/MYLANTA) 200-200-20 MG/5ML suspension 30 mL (30 mLs Oral Given 02/24/19 0448)    And  lidocaine (XYLOCAINE) 2 % viscous mouth solution 15 mL (15 mLs Oral Given 02/24/19 0448)     Initial Impression / Assessment and Plan / ED Course  I have reviewed the triage vital signs and the nursing notes.  Pertinent labs & imaging results that were available during my care of the patient were reviewed by me and considered in my medical decision making (see chart for details).       Has had chest pain or shortness of breath since 6:30 PM.  Worse with palpation and movement.  EKG with T wave inversions anteriorly which are unchanged.  CXR negative. D-dimer negative. Single troponin is negative in setting of ongoing pain since 630pm. Feel this adequately rules out ACS. Low suspicion for PE or aortic dissection.   Pain somewhat reproducible and improved with GI cocktail.  Start PPI. Avoid alcohol, NSAIDs, caffeine,spicy food. Return to the ED if chest pain becomes exertional, associated with SOB, vomiting, diaphoresis or any other concerns. Final Clinical Impressions(s) / ED Diagnoses   Final diagnoses:  Atypical chest pain    ED Discharge Orders    None  Ezequiel Essex, MD 02/24/19 [id number]

## 2019-03-06 ENCOUNTER — Telehealth: Payer: Self-pay | Admitting: Internal Medicine

## 2019-03-06 NOTE — Telephone Encounter (Signed)
I will route to our Sleep Study dept to reach to pt.

## 2019-03-06 NOTE — Telephone Encounter (Signed)
Called patient; left message to call back.

## 2019-03-06 NOTE — Telephone Encounter (Signed)
New Message     Pt is calling because she says she gotten an order for a cpap and a humidifier  She is not sure if she takes it to the pharmacy or what she needs to do with it   She has to work at 40 and is needing a call back before work if possible     Please call

## 2019-03-06 NOTE — Telephone Encounter (Signed)
[facility] )Jessica Sanchez) called to say they do not take patients insurance and they can not process a cpap for her. Reached out to the patient and she states she can not afford to purchase a cpap but she may be able to get a cpap from a family member. She will look into that and call our office back.

## 2019-03-08 NOTE — Telephone Encounter (Signed)
Patient called back to say the family member does not have the extra cpap anymore and she was told by a local dme that she would still have to pay out of pocket for a donated cpap. Patient states she can not afford to pay for a new cpap.. Patient was informed that the cpap assistance program has been disabled due to covid19 so it is no longer an option either. Patient will contact her referring provider about her situation.

## 2019-03-27 ENCOUNTER — Other Ambulatory Visit: Payer: No Typology Code available for payment source

## 2019-03-27 ENCOUNTER — Ambulatory Visit: Payer: No Typology Code available for payment source | Admitting: Obstetrics & Gynecology

## 2019-04-06 ENCOUNTER — Other Ambulatory Visit: Payer: Self-pay

## 2019-04-06 ENCOUNTER — Ambulatory Visit (INDEPENDENT_AMBULATORY_CARE_PROVIDER_SITE_OTHER): Payer: No Typology Code available for payment source | Admitting: Obstetrics & Gynecology

## 2019-04-06 ENCOUNTER — Encounter: Payer: Self-pay | Admitting: Obstetrics & Gynecology

## 2019-04-06 ENCOUNTER — Ambulatory Visit (INDEPENDENT_AMBULATORY_CARE_PROVIDER_SITE_OTHER): Payer: No Typology Code available for payment source

## 2019-04-06 VITALS — BP 125/78 | HR 80 | Ht 62.0 in | Wt 299.0 lb

## 2019-04-06 DIAGNOSIS — N83202 Unspecified ovarian cyst, left side: Secondary | ICD-10-CM | POA: Diagnosis not present

## 2019-04-06 NOTE — Progress Notes (Signed)
PELVIC US TA/TV: heterogeneous anteverted uterus with mult fibroids,(#1) subserosal anterior right 3.1 x 2.7 x 2.4 cm,(#2) anterior mid/left subserosal fibroid 2.5 x 2.9 x 2.7 cm,(#3) intramural fundal fibroid 3.4 x 3 x 2 cm,IUD is centrally located within the endometrium,EEC 5.2 mm,normal ovaries bilat,no free fluid,ovaries appear mobile,no pain during ultrasound  Chaperon: [place]

## 2019-04-06 NOTE — Progress Notes (Signed)
Patient ID: Jessica Sanchez, female   DOB: 1986/04/07, 33 y.o.   MRN: [id number] Follow up appointment for results  Chief Complaint  Patient presents with  . Follow-up    [place] today    Blood pressure 125/78, pulse 80, height 5\' 2"  (1.575 m), weight 299 lb (135.6 kg).  GYNECOLOGIC SONOGRAM   Jessica Sanchez is a 33 y.o. G1P0010 she is here for a pelvic sonogram to f/u ovarian cyst.  Uterus                      8 x 5.4 x 6.1 cm, Total uterine volume 139 cc,heterogeneous anteverted uterus with mult fibroids  Endometrium          5.2 mm, symmetrical, IUD is centrally located within the endometrium  Right ovary             2.8 x 2.3 x 2.8 cm, wnl  Left ovary                2.9 x 2.2 x 2.8 cm, wnl  Fibroids                  (#1) subserosal anterior right 3.1 x 2.7 x 2.4 cm,(#2) anterior mid/left subserosal fibroid 2.5 x 2.9 x 2.7 cm,(#3) intramural fundal fibroid 3.4 x 3 x 2 cm  Technician Comments:  PELVIC US TA/TV: heterogeneous anteverted uterus with mult fibroids,(#1) subserosal anterior right 3.1 x 2.7 x 2.4 cm,(#2) anterior mid/left subserosal fibroid 2.5 x 2.9 x 2.7 cm,(#3) intramural fundal fibroid 3.4 x 3 x 2 cm,IUD is centrally located within the endometrium,EEC 5.2 mm,normal ovaries bilat,no free fluid,ovaries appear mobile,no pain during ultrasound  Chaperon: Harl Favor 04/06/2019 1:20 PM  Clinical Impression and recommendations:  I have reviewed the sonogram results above, combined with the patient's current clinical course, below are my impressions and any appropriate recommendations for management based on the sonographic findings.  Uterus overall normal volume with 3 small fibroids Endometrium is normal with proper IUD location Both ovaries are normal   [name] 04/10/2019 3:50 PM     MEDS ordered this encounter: No orders of the defined types were placed in this encounter.   Orders for this encounter: No orders of the  defined types were placed in this encounter.   Impression:   ICD-10-CM   1. Left ovarian cyst  N83.202    resolved     Plan: Resolved cyst IUD proper location  Follow Up: Return in about 3 months (around 07/05/2019) for IUD removal and re insertion with Manus Gunning.       Face to face time:  10 minutes  Greater than 50% of the visit time was spent in counseling and coordination of care with the patient.  The summary and outline of the counseling and care coordination is summarized in the note above.   All questions were answered.  Past Medical History:  Diagnosis Date  . Fibroid (bleeding) (uterine)   . Fibroid, uterine   . H/O metrorrhagia   . IUD (intrauterine device) in place 07/11/2015  . Menorrhagia   . Obesity, Class III, BMI 40-49.9 (morbid obesity) ([place]) 11/11/2018  . SVT (supraventricular tachycardia) ([facility])     Past Surgical History:  Procedure Laterality Date  . HYSTEROSCOPY WITH D & C N/A 09/27/2013   Procedure: DILATATION AND CURETTAGE /HYSTEROSCOPY and insertion of Mirena IUD ;  Surgeon: [name]. Harrington Challenger, MD;  Location: [facility];  Service: Gynecology;  Laterality: N/A;  . MYOMECTOMY  02/03/2011   Procedure: MYOMECTOMY;  Surgeon: [name], MD;  Location: [facility];  Service: Gynecology;  Laterality: N/A;  . SVT ABLATION N/A 11/08/2018   Procedure: SVT ABLATION;  Surgeon: [name], MD;  Location: [facility];  Service: Cardiovascular;  Laterality: N/A;  . TONSILLECTOMY      OB History    Gravida  1   Para      Term      Preterm      AB  1   Living        SAB  1   TAB      Ectopic      Multiple      Live Births              Allergies  Allergen Reactions  . Shellfish Allergy Shortness Of Breath, Itching and Swelling    Mouth became swollen, throat started itching, and developed shortness of breath  . Adhesive [Tape] Itching    Depends on the type of medical tape    Social History   Socioeconomic History  . Marital status:  Single    Spouse name: Not on file  . Number of children: Not on file  . Years of education: Not on file  . Highest education level: Not on file  Occupational History  . Not on file  Tobacco Use  . Smoking status: Former Research scientist (life sciences)  . Smokeless tobacco: Never Used  Substance and Sexual Activity  . Alcohol use: Yes    Comment: occasional  . Drug use: No  . Sexual activity: Yes    Birth control/protection: I.U.D.  Other Topics Concern  . Not on file  Social History Narrative  . Not on file   Social Determinants of Health   Financial Resource Strain:   . Difficulty of Paying Living Expenses:   Food Insecurity:   . Worried About Charity fundraiser in the Last Year:   . Arboriculturist in the Last Year:   Transportation Needs:   . Film/video editor (Medical):   Marland Kitchen Lack of Transportation (Non-Medical):   Physical Activity:   . Days of Exercise per Week:   . Minutes of Exercise per Session:   Stress:   . Feeling of Stress :   Social Connections:   . Frequency of Communication with Friends and Family:   . Frequency of Social Gatherings with Friends and Family:   . Attends Religious Services:   . Active Member of Clubs or Organizations:   . Attends Archivist Meetings:   Marland Kitchen Marital Status:     Family History  Problem Relation Age of Onset  . Cirrhosis Mother   . Diabetes Mother   . Cancer Maternal Grandfather   . Hypertension Sister   . Diabetes Sister   . Anesthesia problems Neg Hx   . Hypotension Neg Hx   . Malignant hyperthermia Neg Hx   . Pseudochol deficiency Neg Hx

## 2019-04-11 ENCOUNTER — Encounter: Payer: Self-pay | Admitting: Orthopedic Surgery

## 2019-04-11 ENCOUNTER — Other Ambulatory Visit: Payer: Self-pay

## 2019-04-11 ENCOUNTER — Ambulatory Visit (INDEPENDENT_AMBULATORY_CARE_PROVIDER_SITE_OTHER): Payer: No Typology Code available for payment source | Admitting: Orthopedic Surgery

## 2019-04-11 VITALS — BP 127/79 | HR 88 | Ht 62.0 in | Wt 307.0 lb

## 2019-04-11 DIAGNOSIS — M75101 Unspecified rotator cuff tear or rupture of right shoulder, not specified as traumatic: Secondary | ICD-10-CM

## 2019-04-11 MED ORDER — TRAMADOL-ACETAMINOPHEN 37.5-325 MG PO TABS: 1.0000 | ORAL_TABLET | Freq: Four times a day (QID) | ORAL | 0 refills | Status: AC | PRN

## 2019-04-11 MED ORDER — MELOXICAM 7.5 MG PO TABS: 7.5000 mg | ORAL_TABLET | Freq: Every day | ORAL | 5 refills | Status: DC

## 2019-04-11 NOTE — Progress Notes (Signed)
Jessica Sanchez  04/11/2019  Body mass index is 56.15 kg/m.   HISTORY SECTION :  Chief Complaint  Patient presents with  . Shoulder Pain    pain with ROM right shoulder   . Neck Pain    has neck pain "at times" also    HPI The patient presents for evaluation of  (mild/moderate/severe/ ) severe  pain, in the (right /left) right shoulder  , for 1 1/2 yrs , associated with loss of motion.  Prior treatment I saw Jessica Sanchez back in 2019 scheduled for MRI for possible cuff tear I gave her an injection she had had anti-inflammatories somehow her procedure never got done.  She is also had neck x-rays which showed she might have some muscle spasms up there most of her pain is in the shoulder deltoid and some in the right trapezius muscle no real midline cervical pain she is currently on ibuprofen  She has night pain cannot sleep on the right side  Continues to work at [employer].  She said during the last injection it hurt really bad for couple of days which sounds like steroid flare.   Review of Systems  Constitutional: Negative for chills and fever.  Respiratory: Positive for shortness of breath.   Cardiovascular: Positive for palpitations.  Neurological: Negative for tingling.     has a past medical history of Fibroid (bleeding) (uterine), Fibroid, uterine, H/O metrorrhagia, IUD (intrauterine device) in place (07/11/2015), Menorrhagia, Obesity, Class III, BMI 40-49.9 (morbid obesity) ([place]) (11/11/2018), and SVT (supraventricular tachycardia) ([place]).   Past Surgical History:  Procedure Laterality Date  . HYSTEROSCOPY W/D&C N/A 09/27/2013   Procedure: DILATATION AND CURETTAGE /HYSTEROSCOPY and insertion of Mirena IUD ;  Surgeon: [name]. Harrington Challenger, MD;  Location: [facility];  Service: Gynecology;  Laterality: N/A;  . MYOMECTOMY  02/03/2011   Procedure: MYOMECTOMY;  Surgeon: [name], MD;  Location: [facility];  Service: Gynecology;  Laterality: N/A;  . SVT ABLATION N/A 11/08/2018   Procedure:  SVT ABLATION;  Surgeon: [name], MD;  Location: [facility];  Service: Cardiovascular;  Laterality: N/A;  . TONSILLECTOMY      Body mass index is 56.15 kg/m.   Allergies  Allergen Reactions  . Shellfish Allergy Shortness Of Breath, Itching and Swelling    Mouth became swollen, throat started itching, and developed shortness of breath  . Adhesive [Tape] Itching    Depends on the type of medical tape     Current Outpatient Medications:  .  diltiazem (CARDIZEM CD) 180 MG 24 hr capsule, Take 1 capsule (180 mg total) by mouth daily for 21 days., Disp: 21 capsule, Rfl: 0 .  ibuprofen (ADVIL) 600 MG tablet, Take 1 tablet (600 mg total) by mouth every 6 (six) hours as needed., Disp: 30 tablet, Rfl: 0 .  levonorgestrel (MIRENA) 20 MCG/24HR IUD, 1 each by Intrauterine route once. EVERY 5 YEARS, Disp: , Rfl:  .  megestrol (MEGACE) 40 MG tablet, 1 tablet per day, Disp: 30 tablet, Rfl: 3 .  Multiple Vitamins-Calcium (ONE-A-DAY WOMENS PO), Take 1 tablet by mouth daily., Disp: , Rfl:  .  albuterol (PROVENTIL HFA) 108 (90 Base) MCG/ACT inhaler, Inhale 1-2 puffs into the lungs every 6 (six) hours as needed for wheezing or shortness of breath. , Disp: , Rfl:  .  famotidine (PEPCID) 20 MG tablet, Take 1 tablet (20 mg total) by mouth 2 (two) times daily. (Patient not taking: Reported on 04/11/2019), Disp: 30 tablet, Rfl:  0 .  meloxicam (MOBIC) 7.5 MG tablet, Take 1 tablet (7.5 mg total) by mouth daily., Disp: 30 tablet, Rfl: 5 .  omeprazole (PRILOSEC) 20 MG capsule, Take 1 capsule (20 mg total) by mouth daily. (Patient not taking: Reported on 04/11/2019), Disp: 30 capsule, Rfl: 0 .  pantoprazole (PROTONIX) 40 MG tablet, Take 1 tablet (40 mg total) by mouth daily. (Patient not taking: Reported on 04/11/2019), Disp: 30 tablet, Rfl: 1 .  traMADol-acetaminophen (ULTRACET) 37.5-325 MG tablet, Take 1 tablet by mouth every 6 (six) hours as needed for up to 7 days for severe pain., Disp: 28 tablet, Rfl:  0   PHYSICAL EXAM SECTION: 1) BP 127/79   Pulse 88   Ht 5\' 2"  (1.575 m)   Wt (!) 307 lb (139.3 kg)   BMI 56.15 kg/m   Body mass index is 56.15 kg/m. General appearance: Well-developed well-nourished no gross deformities  2) Cardiovascular normal pulse and perfusion in the both upper extremities normal color without edema  3) Neurologically deep tendon reflexes are equal and normal, no sensation loss or deficits no pathologic reflexes  4) Psychological: Awake alert and oriented x3 mood and affect normal  5) Skin no lacerations or ulcerations no nodularity no palpable masses, no erythema or nodularity  6) Musculoskeletal:  Left shoulder range of motion is normal shoulder is stable strength is normal there is no tenderness  Right shoulder is tender in the subacromial region right deltoid right trap  Her active motion is only 90 degrees abduction and flexion with passive range of motion 120 degrees with pain and positive impingement sign internal and external rotation strength remains normal.  She does have weakness in the supraspinatus unclear whether the pain related  Drop arm test is negative   MEDICAL DECISION SECTION:  Encounter Diagnosis  Name Primary?  . Rotator cuff syndrome of right shoulder Yes    Imaging Neck and shoulder plain films are negative for any acute process or bone problem  Plan:  (Rx., Inj., surg., Frx, MRI/CT, XR:2)  Recommend injection subacromial space Medication for pain Try to get the MRI again as she has had pain for a year and a half she has had propria nonoperative care   Procedure note the subacromial injection shoulder RIGHT    Verbal consent was obtained to inject the  RIGHT   Shoulder  Timeout was completed to confirm the injection site is a subacromial space of the  RIGHT  shoulder   Medication used Depo-Medrol 40 mg and lidocaine 1% 3 cc  Anesthesia was provided by ethyl chloride  The injection was performed in the RIGHT   posterior subacromial space. After pinning the skin with alcohol and anesthetized the skin with ethyl chloride the subacromial space was injected using a 20-gauge needle. There were no complications  Sterile dressing was applied.    9:02 AM Arther Abbott, MD  04/11/2019

## 2019-04-11 NOTE — Patient Instructions (Addendum)
OOW 2 DAYS   You have received an injection of steroids into the joint. 15% of patients will have increased pain within the 24 hours postinjection.   This is transient and will go away.   We recommend that you use ice packs on the injection site for 20 minutes every 2 hours and extra strength Tylenol 2 tablets every 8 as needed until the pain resolves.  If you continue to have pain after taking the Tylenol and using the ice please call the office for further instructions.  STOP IBUPROFEN START NEW MEDICATIONS   You have been scheduled for an MRI scan  We will call your insurance company to do a precertification to get the MRI covered  You will receive a phone call regarding the date of the scan  Dr Aline Brochure will call you with the results

## 2019-04-13 ENCOUNTER — Ambulatory Visit: Payer: Self-pay | Admitting: Orthopedic Surgery

## 2019-04-20 ENCOUNTER — Encounter (HOSPITAL_COMMUNITY): Payer: Self-pay | Admitting: Emergency Medicine

## 2019-04-20 ENCOUNTER — Emergency Department (HOSPITAL_COMMUNITY): Payer: No Typology Code available for payment source

## 2019-04-20 ENCOUNTER — Other Ambulatory Visit: Payer: No Typology Code available for payment source

## 2019-04-20 ENCOUNTER — Emergency Department (HOSPITAL_COMMUNITY)
Admission: EM | Admit: 2019-04-20 | Discharge: 2019-04-21 | Disposition: A | Discharge status: Home / Self Care | Payer: No Typology Code available for payment source | Attending: Emergency Medicine | Admitting: Emergency Medicine

## 2019-04-20 ENCOUNTER — Ambulatory Visit: Payer: No Typology Code available for payment source

## 2019-04-20 ENCOUNTER — Other Ambulatory Visit: Payer: Self-pay

## 2019-04-20 DIAGNOSIS — R600 Localized edema: Secondary | ICD-10-CM

## 2019-04-20 DIAGNOSIS — I471 Supraventricular tachycardia: Secondary | ICD-10-CM | POA: Diagnosis not present

## 2019-04-20 DIAGNOSIS — Z975 Presence of (intrauterine) contraceptive device: Secondary | ICD-10-CM | POA: Insufficient documentation

## 2019-04-20 DIAGNOSIS — Z79899 Other long term (current) drug therapy: Secondary | ICD-10-CM | POA: Diagnosis not present

## 2019-04-20 DIAGNOSIS — R609 Edema, unspecified: Secondary | ICD-10-CM | POA: Insufficient documentation

## 2019-04-20 DIAGNOSIS — R0789 Other chest pain: Secondary | ICD-10-CM | POA: Diagnosis present

## 2019-04-20 DIAGNOSIS — R079 Chest pain, unspecified: Secondary | ICD-10-CM

## 2019-04-20 DIAGNOSIS — Z20822 Contact with and (suspected) exposure to covid-19: Secondary | ICD-10-CM

## 2019-04-20 LAB — BASIC METABOLIC PANEL
Anion gap: 9 (ref 5–15)
BUN: 11 mg/dL (ref 6–20)
CO2: 24 mmol/L (ref 22–32)
Calcium: 8.9 mg/dL (ref 8.9–10.3)
Chloride: 102 mmol/L (ref 98–111)
Creatinine, Ser: 0.6 mg/dL (ref 0.44–1.00)
GFR calc Af Amer: >60 mL/min (ref >60)
GFR calc non Af Amer: >60 mL/min (ref >60)
Glucose, Bld: 82 mg/dL (ref 70–99)
Potassium: 4.2 mmol/L (ref 3.5–5.1)
Sodium: 135 mmol/L (ref 135–145)

## 2019-04-20 LAB — CBC
HCT: 41.2 % (ref 36.0–46.0)
Hemoglobin: 12.8 g/dL (ref 12.0–15.0)
MCH: 27.2 pg (ref 26.0–34.0)
MCHC: 31.1 g/dL (ref 30.0–36.0)
MCV: 87.5 fL (ref 80.0–100.0)
Platelets: 303 10*3/uL (ref 150–400)
RBC: 4.71 MIL/uL (ref 3.87–5.11)
RDW: 14.6 % (ref 11.5–15.5)
WBC: 6.2 10*3/uL (ref 4.0–10.5)
nRBC: 0 % (ref 0.0–0.2)

## 2019-04-20 LAB — TROPONIN I (HIGH SENSITIVITY)
Troponin I (High Sensitivity): 13 ng/L (ref <18)
Troponin I (High Sensitivity): 8 ng/L (ref <18)

## 2019-04-20 MED ORDER — APIXABAN 5 MG PO TABS: 5.0000 mg | ORAL_TABLET | Freq: Once | ORAL | Status: DC

## 2019-04-20 MED ORDER — ADENOSINE 6 MG/2ML IV SOLN
INTRAVENOUS | Status: AC
  Administered 2019-04-20: 12 mg
  Filled 2019-04-20: qty 10

## 2019-04-20 MED ORDER — APIXABAN 5 MG PO TABS: ORAL_TABLET | ORAL | 0 refills | Status: DC

## 2019-04-20 MED ORDER — ADENOSINE 6 MG/2ML IV SOLN
6.0000 mg | Freq: Once | INTRAVENOUS | Status: AC
  Administered 2019-04-20: 6 mg via INTRAVENOUS

## 2019-04-20 MED ORDER — SODIUM CHLORIDE 0.9% FLUSH
3.0000 mL | Freq: Once | INTRAVENOUS | Status: AC
  Administered 2019-04-20: 3 mL via INTRAVENOUS

## 2019-04-20 MED ORDER — KETOROLAC TROMETHAMINE 30 MG/ML IJ SOLN
30.0000 mg | Freq: Once | INTRAMUSCULAR | Status: AC
  Administered 2019-04-20: 30 mg via INTRAVENOUS
  Filled 2019-04-20: qty 1

## 2019-04-20 MED ORDER — DILTIAZEM HCL 25 MG/5ML IV SOLN
INTRAVENOUS | Status: AC
  Filled 2019-04-20: qty 5

## 2019-04-20 MED ORDER — DILTIAZEM LOAD VIA INFUSION
20.0000 mg | Freq: Once | INTRAVENOUS | Status: AC
  Administered 2019-04-20: 20 mg via INTRAVENOUS
  Filled 2019-04-20: qty 20

## 2019-04-20 MED ORDER — DILTIAZEM HCL-DEXTROSE 125-5 MG/125ML-% IV SOLN (PREMIX)
5.0000 mg/h | INTRAVENOUS | Status: DC
  Administered 2019-04-20: 5 mg/h via INTRAVENOUS
  Filled 2019-04-20: qty 125

## 2019-04-20 MED ORDER — IOHEXOL 350 MG/ML SOLN
100.0000 mL | Freq: Once | INTRAVENOUS | Status: AC | PRN
  Administered 2019-04-21: 100 mL via INTRAVENOUS

## 2019-04-20 MED ORDER — APIXABAN 5 MG PO TABS
10.0000 mg | ORAL_TABLET | Freq: Once | ORAL | Status: AC
  Administered 2019-04-20: 10 mg via ORAL
  Filled 2019-04-20: qty 2

## 2019-04-20 MED ORDER — SODIUM CHLORIDE 0.9 % IV BOLUS
1000.0000 mL | Freq: Once | INTRAVENOUS | Status: AC
  Administered 2019-04-20: 1000 mL via INTRAVENOUS

## 2019-04-20 NOTE — Discharge Instructions (Addendum)
Continue taking your home medications as prescribed.  Drink plenty of fluids and get plenty of rest.   Call your cardiologist and set up a follow-up appointment for as soon as possible.  Return to the emergency department immediately for any concerning signs or symptoms develop such as worsening shortness of breath, high fevers, persistent vomiting, abnormal heart rhythm/palpitations, or chest pain.

## 2019-04-20 NOTE — ED Provider Notes (Signed)
Ultrasound ED Peripheral IV (Provider)  Date/Time: 04/20/2019 11:38 PM Performed by: Maudie Flakes, MD Authorized by: Maudie Flakes, MD   Procedure details:    Indications: multiple failed IV attempts     Skin Prep: chlorhexidine gluconate     Location: Left basilic vein.   Angiocath:  20 G   Bedside Ultrasound Guided: Yes     Patient tolerated procedure without complications: Yes     Dressing applied: Yes        Maudie Flakes, MD 04/20/19 2338

## 2019-04-20 NOTE — ED Provider Notes (Signed)
Shared service with APP.  I have personally seen and examined the patient, providing direct face to face care.  Physical exam findings and plan include patient presents with lightheadedness palpitations shortness of breath with history of recurrent SVT and outpatient follow-up.  On exam patient's heart rate 150, EKG reviewed consistent with SVT, vagal maneuvers unsuccessful.  Adenosine given 2 doses unsuccessful.  Patient improved and heart rate improved with diltiazem.  Patient will be monitored and then follow-up with cardiology outpatient if returns to [facility].  1. Chest pain     .Critical Care Performed by: Elnora Morrison, MD Authorized by: Elnora Morrison, MD   Critical care provider statement:    Critical care time (minutes):  40   Critical care start time:  04/20/2019 2:00 PM   Critical care end time:  04/20/2019 2:40 PM   Critical care time was exclusive of:  Separately billable procedures and treating other patients and teaching time   Critical care was necessary to treat or prevent imminent or life-threatening deterioration of the following conditions:  Cardiac failure   Critical care was time spent personally by me on the following activities:  Evaluation of patient's response to treatment, examination of patient, ordering and performing treatments and interventions, ordering and review of laboratory studies, ordering and review of radiographic studies, pulse oximetry, re-evaluation of patient's condition and review of old charts      Elnora Morrison, MD 04/21/19 0630

## 2019-04-20 NOTE — ED Notes (Signed)
Patient ambulated around nursing station. Upon standing patient states that she felt dizzy. Patient was able to continue to walk. Heart rate 105-107, 02 sat 100, respirations 22. Patient back in bed at this time.

## 2019-04-20 NOTE — ED Provider Notes (Signed)
[facility] EMERGENCY DEPARTMENT Provider Note   CSN: [id number] Arrival date & time: 04/20/19  1305     History Chief Complaint  Patient presents with  . Chest Pain    Jessica Sanchez is a 33 y.o. female with history of SVT, obesity, uterine fibroids, GERD presents for evaluation of acute onset, persistently elevated heart rate.  She reports that she thinks she went into SVT around 530 this morning which lasted about an hour and then she converted back into normal sinus rhythm.  She had recurrent symptoms at around 11:30 AM today which have been persistent for the last 2 hours.  She states that this feels similar to prior episodes of SVT she has gone into.  She reports feeling lightheaded, chest pressure and tightness, shortness of breath.  Denies nausea, vomiting, fever, chills, cough, abdominal pain, or known sick contacts.  She has tried "relaxing", performing vagal maneuvers at home without relief of symptoms.  She is on diltiazem twice daily with which she states she has been compliant.  She had SVT ablation procedure on 11/08/2018 and followed up with Dr. Lovena Le in the office in August but has not been seen by him in person since then.  Also notes left lower extremity soreness and swelling for the last 2 days.  Denies recent travel or surgeries, hemoptysis, prior history of DVT or PE.  Reports that she was recently started on Megace by her OB/GYN.   The history is provided by the patient.       Past Medical History:  Diagnosis Date  . Fibroid (bleeding) (uterine)   . Fibroid, uterine   . H/O metrorrhagia   . IUD (intrauterine device) in place 07/11/2015  . Menorrhagia   . Obesity, Class III, BMI 40-49.9 (morbid obesity) ([place]) 11/11/2018  . SVT (supraventricular tachycardia) [facility])     Patient Active Problem List   Diagnosis Date Noted  . Gastroesophageal reflux disease   . Nonspecific chest pain 11/11/2018  . Obesity, Class III, BMI 40-49.9 (morbid obesity) ([place]) 11/11/2018  .  Elevated d-dimer 11/11/2018  . SVT (supraventricular tachycardia) ([place]) 10/04/2018  . IUD (intrauterine device) in place 07/11/2015  . Other disorder of menstruation and other abnormal bleeding from female genital tract 10/30/2012    Past Surgical History:  Procedure Laterality Date  . HYSTEROSCOPY WITH D & C N/A 09/27/2013   Procedure: DILATATION AND CURETTAGE /HYSTEROSCOPY and insertion of Mirena IUD ;  Surgeon: [name]. Harrington Challenger, MD;  Location: [place] [facility];  Service: Gynecology;  Laterality: N/A;  . MYOMECTOMY  02/03/2011   Procedure: MYOMECTOMY;  Surgeon: [name], MD;  Location: [facility];  Service: Gynecology;  Laterality: N/A;  . SVT ABLATION N/A 11/08/2018   Procedure: SVT ABLATION;  Surgeon: [name], MD;  Location: [facility];  Service: Cardiovascular;  Laterality: N/A;  . TONSILLECTOMY       OB History    Gravida  1   Para      Term      Preterm      AB  1   Living        SAB  1   TAB      Ectopic      Multiple      Live Births              Family History  Problem Relation Age of Onset  . Cirrhosis Mother   . Diabetes Mother   . Cancer Maternal Grandfather   .  Hypertension Sister   . Diabetes Sister   . Anesthesia problems Neg Hx   . Hypotension Neg Hx   . Malignant hyperthermia Neg Hx   . Pseudochol deficiency Neg Hx     Social History   Tobacco Use  . Smoking status: Never Smoker  . Smokeless tobacco: Never Used  Substance Use Topics  . Alcohol use: Yes    Comment: occasional  . Drug use: No    Home Medications Prior to Admission medications   Medication Sig Start Date End Date Taking? Authorizing Provider  albuterol (PROVENTIL HFA) 108 (90 Base) MCG/ACT inhaler Inhale 1-2 puffs into the lungs every 6 (six) hours as needed for wheezing or shortness of breath.    Yes [provider]  diltiazem (CARDIZEM CD) 180 MG 24 hr capsule Take 1 capsule (180 mg total) by mouth daily for 21 days. 10/09/18 04/20/19 Yes Langston Masker B, PA-C  levonorgestrel (MIRENA) 20 MCG/24HR IUD 1 each by Intrauterine route once. EVERY 5 YEARS   Yes [provider]  megestrol (MEGACE) 40 MG tablet 1 tablet per day 12/28/18  Yes [name], MD  Multiple Vitamins-Calcium (ONE-A-DAY WOMENS PO) Take 1 tablet by mouth daily.   Yes [provider]  famotidine (PEPCID) 20 MG tablet Take 1 tablet (20 mg total) by mouth 2 (two) times daily. Patient not taking: Reported on 04/11/2019 12/01/18   Fredia Sorrow, MD  omeprazole (PRILOSEC) 20 MG capsule Take 1 capsule (20 mg total) by mouth daily. Patient not taking: Reported on 04/11/2019 02/24/19   Ezequiel Essex, MD  pantoprazole (PROTONIX) 40 MG tablet Take 1 tablet (40 mg total) by mouth daily. Patient not taking: Reported on 04/11/2019 11/12/18 11/12/19  Barton Dubois, MD  apixaban (ELIQUIS) 5 MG TABS tablet Take 2 tablets (10mg ) twice daily for 7 days, then 1 tablet (5mg ) twice daily 04/20/19 04/20/19  Rodell Perna A, PA-C    Allergies    Shellfish allergy and Adhesive [tape]  Review of Systems   Review of Systems  Constitutional: Negative for chills and fever.  Respiratory: Positive for chest tightness and shortness of breath.   Cardiovascular: Positive for chest pain, palpitations and leg swelling.  Gastrointestinal: Negative for abdominal pain, nausea and vomiting.  Neurological: Positive for light-headedness. Negative for syncope.  All other systems reviewed and are negative.   Physical Exam Updated Vital Signs BP 118/61   Pulse 94   Temp 98.5 F (36.9 C) (Oral)   Resp (!) 22   Ht 5\' 2"  (1.575 m)   Wt 136.1 kg   SpO2 99%   BMI 54.87 kg/m   Physical Exam Vitals and nursing note reviewed.  Constitutional:      General: She is not in acute distress.    Appearance: She is well-developed. She is obese.  HENT:     Head: Normocephalic and atraumatic.  Eyes:     General:        Right eye: No discharge.        Left eye: No discharge.      Conjunctiva/sclera: Conjunctivae normal.  Neck:     Vascular: No JVD.     Trachea: No tracheal deviation.  Cardiovascular:     Rate and Rhythm: Regular rhythm. Tachycardia present.     Pulses:          Carotid pulses are 2+ on the right side and 2+ on the left side.      Radial pulses are 2+ on the right side and  2+ on the left side.       Dorsalis pedis pulses are 2+ on the right side and 2+ on the left side.       Posterior tibial pulses are 2+ on the right side and 2+ on the left side.     Heart sounds: Normal heart sounds.     Comments: Bevelyn Buckles' sign present on the left.  Compartments are soft. Pulmonary:     Effort: Pulmonary effort is normal.     Comments: Lungs clear to auscultation bilaterally, SPO2 saturations 99% on room air, placed on supplemental oxygen for comfort. Abdominal:     General: There is no distension.     Palpations: Abdomen is soft.  Musculoskeletal:     Right lower leg: No tenderness. No edema.     Left lower leg: Tenderness present. Edema present.  Skin:    General: Skin is warm and dry.     Findings: No erythema.  Neurological:     Mental Status: She is alert.  Psychiatric:        Behavior: Behavior normal.     ED Results / Procedures / Treatments   Labs (all labs ordered are listed, but only abnormal results are displayed) Labs Reviewed  BASIC METABOLIC PANEL  CBC  POC URINE PREG, ED  I-STAT BETA HCG BLOOD, ED ([facility], [facility], [facility] ONLY)  TROPONIN I (HIGH SENSITIVITY)  TROPONIN I (HIGH SENSITIVITY)    EKG None  Radiology US Venous Img Lower  Left (DVT Study)  Result Date: 04/20/2019 CLINICAL DATA:  Pain and swelling, shortness of breath. EXAM: LEFT LOWER EXTREMITY VENOUS DOPPLER ULTRASOUND TECHNIQUE: Gray-scale sonography with compression, as well as color and duplex ultrasound, were performed to evaluate the deep venous system from the level of the common femoral vein through the popliteal and proximal calf veins. Technologist describes  technically difficult study secondary to morbid obesity. COMPARISON:  01/27/2018 FINDINGS: Normal compressibility of the common femoral, superficial femoral, and popliteal veins, as well as the proximal calf veins. No filling defects to suggest DVT on grayscale or color Doppler imaging. Doppler waveforms show normal direction of venous flow, normal respiratory phasicity and response to augmentation. Survey views of the contralateral common femoral vein are unremarkable. IMPRESSION: No femoropopliteal and no calf DVT in the visualized calf veins. If clinical symptoms are inconsistent or if there are persistent or worsening symptoms, further imaging (possibly involving the iliac veins) may be warranted. Electronically Signed   By: Lucrezia Europe M.D.   On: 04/20/2019 15:35   DG Chest Port 1 View  Result Date: 04/20/2019 CLINICAL DATA:  Chest pain. EXAM: PORTABLE CHEST 1 VIEW COMPARISON:  February 24, 2019. FINDINGS: The heart size and mediastinal contours are within normal limits. Both lungs are clear. No pneumothorax or pleural effusion is noted. The visualized skeletal structures are unremarkable. IMPRESSION: No active disease. Electronically Signed   By: Marijo Conception M.D.   On: 04/20/2019 14:41    Procedures Procedures (including critical care time)  Medications Ordered in ED Medications  iohexol (OMNIPAQUE) 350 MG/ML injection 100 mL (has no administration in time range)  sodium chloride flush (NS) 0.9 % injection 3 mL (3 mLs Intravenous Given 04/20/19 1351)  adenosine (ADENOCARD) 6 MG/2ML injection 6 mg (6 mg Intravenous Given 04/20/19 1432)  adenosine (ADENOCARD) 6 MG/2ML injection (12 mg  Given 04/20/19 1432)  diltiazem (CARDIZEM) 1 mg/mL load via infusion 20 mg (20 mg Intravenous Bolus from Bag 04/20/19 1441)  sodium chloride 0.9 % bolus 1,000  mL (0 mLs Intravenous Stopped 04/20/19 2031)  ketorolac (TORADOL) 30 MG/ML injection 30 mg (30 mg Intravenous Given by Other 04/20/19 1924)  apixaban  (ELIQUIS) tablet 10 mg (10 mg Oral Given 04/20/19 2217)    ED Course  I have reviewed the triage vital signs and the nursing notes.  Pertinent labs & imaging results that were available during my care of the patient were reviewed by me and considered in my medical decision making (see chart for details).    MDM Rules/Calculators/A&P                      Patient presenting for evaluation of elevated heart rate, found to be in SVT.  Has a history of SVT that has required cardioversion and ablation previously.  She is afebrile, persistently tachycardic in the 150s on initial assessment, vital signs otherwise stable.  She is uncomfortable but nontoxic in appearance, placed on supplemental oxygen for comfort.  Was never truly hypoxic in the ED.   2:09PM Patient given 6mg  adenosine IV dose with brief pause, NSR for 1-2 seconds then went back into SVT  2:10PM Patient given 12 mg adenosine IV dose with brief pause, NSR for 2 to 3 seconds then went back into SVT.  Will give 20 mg dose of diltiazem bolus.  Lab work reviewed by me shows no leukocytosis, no anemia, no metabolic derangements, no renal insufficiency.  Serial troponins are negative.  On reassessment patient resting more comfortably, heart rate in the low 100s/90s.  She reports persistent chest tightness and palpitations so we will start on diltiazem infusion for further rate control.  Repeat EKG shows possible junctional rhythm however with a heart rate of 101.  Another repeat EKG was obtained after patient was placed on diltiazem infusion which showed normal sinus rhythm with rate in the upper 90s.  Given her persistent symptoms concern for possible PE.  Her DVT study of the left lower extremity was negative; she tells me that she will sometimes get swelling in her legs with persistent standing while at work.  Will obtain CTA to rule out PE.  5:36 PM CONSULT: Spoke with Dr. Johnsie Cancel with cardiology who reviewed the patient's EKGs.  One did  show a possible junctional rhythm however it was a fast rate which is reassuring.  Most recent EKG shows normal sinus rhythm.  The patient remains symptomatic with feeling of chest tightness and occasional palpitations but overall hemodynamically stable.  He agrees with obtaining PE study and we will wean her off of the diltiazem drip.  If PE study is negative and she remains in normal sinus rhythm she will likely be stable for discharge home with close cardiology follow-up outpatient.   9:30PM Patient was unable to obtain CTA as the CT scanner was down.  She was ambulated in the ED with stable SPO2 saturations, mild tachycardia and reported feeling somewhat dizzy but was steady with ambulation. Dr. Sedonia Small and patient had shared-decision making conversation and decided to start patient on Eliquis, discharge home with instructions to return as soon as CT scanner is in good working order for PE study.  Patient verbalized understanding of and agreement with this plan and is stable for discharge home at this time.  10:30PM Received call from radiology noting that [place] is currently working appropriately.  Patient is still in the department.  We discussed the risks and benefits of leaving without CTA and she would like to stay for her imaging study which I  think is reasonable.  Patient signed out to oncoming provider PA [place].  Pending CTA of chest.  If negative for PE, patient likely stable for discharge home with close cardiology follow-up on an outpatient basis.  Otherwise may need admission if she has evidence of acute PEs on imaging.  Final Clinical Impression(s) / ED Diagnoses Final diagnoses:  SVT (supraventricular tachycardia) ([place])  Edema of left lower extremity    Rx / DC Orders ED Discharge Orders         Ordered    apixaban (ELIQUIS) 5 MG TABS tablet  Status:  Discontinued     04/20/19 [place], Bessie Boyte A, PA-C 04/20/19 2243    Maudie Flakes, MD 04/24/19 1553

## 2019-04-20 NOTE — ED Triage Notes (Signed)
Patient reports chest pain, SOB, dizziness, and weakness that started this am. Has history of SVT. EKG completed in Triage.

## 2019-04-21 LAB — NOVEL CORONAVIRUS, NAA: SARS-CoV-2, NAA: NOT DETECTED

## 2019-04-21 NOTE — ED Provider Notes (Signed)
1:36 AM signout from [facility] PA-C at shift change.  Patient awaiting CT PE study to rule out signs of blood clot.  I discussed the results with the patient.  Discussed limitations of the study however we do not see any central large pulmonary emboli.  Patient states that overall she is feeling better, she is just hungry now.  She is willing to follow-up with cardiology again given recurrent symptoms.  She states that she does not was come to the hospital for these unless they are persistent.  I have low suspicion for peripheral PE.  Patient is ready for discharge to home.  We discussed return if her symptoms worsen or she developed persistent chest pain or shortness of breath.  BP 118/61   Pulse 94   Temp 98.5 F (36.9 C) (Oral)   Resp (!) 22   Ht 5\' 2"  (1.575 m)   Wt 136.1 kg   SpO2 99%   BMI 54.87 kg/m     Carlisle Cater, PA-C 04/21/19 [id number]    Maudie Flakes, MD 04/24/19 1600

## 2019-04-21 NOTE — ED Notes (Signed)
Patient transported to CT 

## 2019-05-02 ENCOUNTER — Ambulatory Visit
Admission: RE | Admit: 2019-05-02 | Discharge: 2019-05-02 | Disposition: A | Discharge status: Home / Self Care | Payer: No Typology Code available for payment source | Source: Ambulatory Visit | Attending: Orthopedic Surgery | Admitting: Orthopedic Surgery

## 2019-05-02 ENCOUNTER — Other Ambulatory Visit: Payer: Self-pay

## 2019-05-02 DIAGNOSIS — M75101 Unspecified rotator cuff tear or rupture of right shoulder, not specified as traumatic: Secondary | ICD-10-CM

## 2019-05-03 ENCOUNTER — Telehealth: Payer: Self-pay | Admitting: Orthopedic Surgery

## 2019-05-03 NOTE — Telephone Encounter (Signed)
Call her 

## 2019-05-03 NOTE — Telephone Encounter (Signed)
Patient states she went to [place] but still could not tolerate it.  She wants to know what else she can do.  She said she was told while she was there that Dr Aline Brochure could possibly give her something to relax her so that she was not so claustrophobic or she could have MRI at [facility] and posibly be sedated.

## 2019-05-07 ENCOUNTER — Other Ambulatory Visit: Payer: Self-pay | Admitting: Orthopedic Surgery

## 2019-05-07 DIAGNOSIS — F419 Anxiety disorder, unspecified: Secondary | ICD-10-CM

## 2019-05-07 MED ORDER — DIAZEPAM 10 MG PO TABS: 10.0000 mg | ORAL_TABLET | Freq: Once | ORAL | 0 refills | Status: AC

## 2019-05-07 NOTE — Progress Notes (Signed)
claustrophobia

## 2019-05-23 ENCOUNTER — Ambulatory Visit: Payer: No Typology Code available for payment source | Attending: Internal Medicine

## 2019-05-23 ENCOUNTER — Other Ambulatory Visit: Payer: Self-pay

## 2019-05-23 DIAGNOSIS — Z20822 Contact with and (suspected) exposure to covid-19: Secondary | ICD-10-CM

## 2019-05-24 LAB — NOVEL CORONAVIRUS, NAA: SARS-CoV-2, NAA: NOT DETECTED

## 2019-06-01 ENCOUNTER — Telehealth: Payer: Self-pay

## 2019-06-01 NOTE — Telephone Encounter (Signed)
Rec'd call from Pt requesting visit with Dr.Taylor. Pt states she does not feel her medications are working.  Please call [phone]  Thanks renee

## 2019-06-13 ENCOUNTER — Telehealth: Payer: Self-pay | Admitting: Internal Medicine

## 2019-06-13 ENCOUNTER — Telehealth: Payer: Self-pay | Admitting: Student

## 2019-06-13 NOTE — Telephone Encounter (Signed)

## 2019-06-13 NOTE — Telephone Encounter (Signed)
STAT if HR is under 50 or over 120 (normal HR is 60-100 beats per minute)  1) What is your heart rate? 130  2) Do you have a log of your heart rate readings (document readings)?   3) Do you have any other symptoms? Swelling, burning/tingling feeling in her feet that moves up her leg  Pt c/o swelling: STAT is pt has developed SOB within 24 hours  1) How much weight have you gained and in what time span?   2) If swelling, where is the swelling located? She notices the swelling in her Feet and ankles but she just feels bloated overall  3) Are you currently taking a fluid pill? no  4) Are you currently SOB? Mild, but not much  5) Do you have a log of your daily weights (if so, list)?  Thursday: 299 or 300  Tuesday: 307  6) Have you gained 3 pounds in a day or 5 pounds in a week? no  7) Have you traveled recently? no

## 2019-06-13 NOTE — Telephone Encounter (Signed)
I spoke to the patient who was calling because of elevated HR and leg swelling/burning  She has had intermittent CP and SOB.  She is scheduled with Jessica Sanchez by [facility] on 2/11 for further evaluation.    The patient knows to go to the ED for further evaluation, if symptoms worsen.

## 2019-06-14 ENCOUNTER — Telehealth: Payer: Self-pay | Admitting: Student

## 2019-06-14 ENCOUNTER — Telehealth (INDEPENDENT_AMBULATORY_CARE_PROVIDER_SITE_OTHER): Payer: Self-pay | Admitting: Student

## 2019-06-14 ENCOUNTER — Encounter: Payer: Self-pay | Admitting: Student

## 2019-06-14 VITALS — BP 140/103 | HR 120 | Ht 62.0 in | Wt 307.0 lb

## 2019-06-14 DIAGNOSIS — G4733 Obstructive sleep apnea (adult) (pediatric): Secondary | ICD-10-CM

## 2019-06-14 DIAGNOSIS — I471 Supraventricular tachycardia: Secondary | ICD-10-CM | POA: Diagnosis not present

## 2019-06-14 DIAGNOSIS — R002 Palpitations: Secondary | ICD-10-CM

## 2019-06-14 DIAGNOSIS — R600 Localized edema: Secondary | ICD-10-CM

## 2019-06-14 MED ORDER — DILTIAZEM HCL ER COATED BEADS 240 MG PO CP24: 240.0000 mg | ORAL_CAPSULE | Freq: Every day | ORAL | 3 refills | Status: DC

## 2019-06-14 NOTE — Progress Notes (Addendum)
Virtual Visit via Video Note   This visit type was conducted due to national recommendations for restrictions regarding the COVID-19 Pandemic (e.g. social distancing) in an effort to limit this patient's exposure and mitigate transmission in our community.  Due to her co-morbid illnesses, this patient is at least at moderate risk for complications without adequate follow up.  This format is felt to be most appropriate for this patient at this time.  All issues noted in this document were discussed and addressed.  A limited physical exam was performed with this format.  Please refer to the patient's chart for her consent to telehealth for [facility].   Date:  06/14/2019   ID:  Jessica Sanchez, DOB 1985/09/13, MRN [id number]  Patient Location: Home Provider Location: Office  PCP:  Lucia Gaskins, MD  Cardiologist:  Kate Sable, MD  Electrophysiologist: Dr. Lovena Le  Evaluation Performed:  Follow-Up Visit  Chief Complaint:  Palpitations and Lower Extremity Edema  History of Present Illness:    Jessica Sanchez is a 34 y.o. female with past medical history of pSVT (s/p ablation in 11/2018), OSA and uterine fibroids who presents for a follow-up telehealth evaluation in regards to palpitations and lower extremity edema.   She was last examined by Dr. Lovena Le in 12/2018 and had been in the ED twice since her ablation with palpitations and chest pain but was in sinus tachycardia upon arrival. She was continued on Cardizem CD 180mg  daily and informed to take an extra tablet as needed for recurrent palpitations.   She called the office on 06/13/2019 reporting worsening palpitations and edema, therefore a telehealth visit was arranged.   In talking to the patient today, she reports having more frequent episodes of palpitations over the past few weeks to months. She reports her heart rate is typically in the 80's to 90's but has been elevated in the low 100's to 120's when checked at home. Her  palpitations can last for minutes up to hours at a time and she reports associated dizziness with this. She denies any caffeine intake since 03/2019.  Starting last week, she did notice swelling along her feet bilaterally. Feels bloated all over. Denies any orthopnea or PND. She has been on her feet more regularly as she is now working 2 jobs.  The patient does not have symptoms concerning for COVID-19 infection (fever, chills, cough, or new shortness of breath).    Past Medical History:  Diagnosis Date  . Fibroid (bleeding) (uterine)   . Fibroid, uterine   . H/O metrorrhagia   . IUD (intrauterine device) in place 07/11/2015  . Menorrhagia   . Obesity, Class III, BMI 40-49.9 (morbid obesity) ([place]) 11/11/2018  . SVT (supraventricular tachycardia) ([facility])    Past Surgical History:  Procedure Laterality Date  . HYSTEROSCOPY WITH D & C N/A 09/27/2013   Procedure: DILATATION AND CURETTAGE /HYSTEROSCOPY and insertion of Mirena IUD ;  Surgeon: [name]. Harrington Challenger, MD;  Location: [facility];  Service: Gynecology;  Laterality: N/A;  . MYOMECTOMY  02/03/2011   Procedure: MYOMECTOMY;  Surgeon: [name], MD;  Location: [facility];  Service: Gynecology;  Laterality: N/A;  . SVT ABLATION N/A 11/08/2018   Procedure: SVT ABLATION;  Surgeon: [name], MD;  Location: [facility];  Service: Cardiovascular;  Laterality: N/A;  . TONSILLECTOMY       Current Meds  Medication Sig  . albuterol (PROVENTIL HFA) 108 (90 Base) MCG/ACT inhaler Inhale 1-2 puffs into the  lungs every 6 (six) hours as needed for wheezing or shortness of breath.   . diltiazem (CARDIZEM CD) 240 MG 24 hr capsule Take 1 capsule (240 mg total) by mouth daily.  Marland Kitchen levonorgestrel (MIRENA) 20 MCG/24HR IUD 1 each by Intrauterine route once. EVERY 5 YEARS  . Multiple Vitamins-Calcium (ONE-A-DAY WOMENS PO) Take 1 tablet by mouth daily.  Marland Kitchen omeprazole (PRILOSEC) 20 MG capsule Take 1 capsule (20 mg total) by mouth daily.  . [DISCONTINUED] diltiazem  (CARDIZEM CD) 180 MG 24 hr capsule Take 1 capsule (180 mg total) by mouth daily for 21 days.     Allergies:   Shellfish allergy and Adhesive [tape]   Social History   Tobacco Use  . Smoking status: Never Smoker  . Smokeless tobacco: Never Used  Substance Use Topics  . Alcohol use: Yes    Comment: occasional  . Drug use: No     Family Hx: The patient's family history includes Cancer in her maternal grandfather; Cirrhosis in her mother; Diabetes in her mother and sister; Hypertension in her sister. There is no history of Anesthesia problems, Hypotension, Malignant hyperthermia, or Pseudochol deficiency.  ROS:   Please see the history of present illness.     All other systems reviewed and are negative.   Prior CV studies:   The following studies were reviewed today:  Echocardiogram: 11/2018 IMPRESSIONS    1. The left ventricle has normal systolic function with an ejection  fraction of 60-65%. The cavity size was normal. Left ventricular diastolic  parameters were normal. No evidence of left ventricular regional wall  motion abnormalities.  2. The right ventricle has normal systolic function. The cavity was  normal. There is no increase in right ventricular wall thickness. Right  ventricular systolic pressure could not be assessed.  3. There is no evidence of pericardial effusion.   Labs/Other Tests and Data Reviewed:    EKG:  An ECG dated 04/20/2019 was personally reviewed today and demonstrated:  NSR, HR 96 with no acute ST abnormalities when compared to prior tracings. TWI along anterior leads noted on prior tracings.   Recent Labs: 10/04/2018: TSH 2.172 11/11/2018: Magnesium 2.0 12/17/2018: ALT 14 04/20/2019: BUN 11; Creatinine, Ser 0.60; Hemoglobin 12.8; Platelets 303; Potassium 4.2; Sodium 135   Recent Lipid Panel No results found for: CHOL, TRIG, HDL, CHOLHDL, LDLCALC, LDLDIRECT  Wt Readings from Last 3 Encounters:  06/14/19 (!) 307 lb (139.3 kg)  04/20/19  300 lb (136.1 kg)  04/11/19 (!) 307 lb (139.3 kg)     Objective:    Vital Signs:  BP (!) 140/103   Pulse (!) 120   Ht 5\' 2"  (1.575 m)   Wt (!) 307 lb (139.3 kg)   BMI 56.15 kg/m    General: Pleasant female appearing in NAD Psych: Normal affect. Neuro: Alert and oriented X 3. Moves all extremities spontaneously.  Lungs:  Resp regular and unlabored by video assessment.    ASSESSMENT & PLAN:    1. Palpitations/History of SVT - she has a history of known SVT and is s/p ablation in 11/2018. She reports having palpitations for the past several weeks to months and feels like her episodes are increasing in frequency and severity. She was in normal sinus rhythm at the time of ED evaluation in 04/2019. - Heart rate remains elevated in the 100's to 120's per her report. She is currently on Cardizem CD 180 mg daily. Will titrate dosing to 240 mg daily. Will plan for her to come to  the office tomorrow for a nurse visit for an EKG. If unrevealing, will plan for a 2-week cardiac event monitor to rule out any recurrent arrhythmias.  2. Lower Extremity Edema - This has been occurring for the past week and is mostly along the top of her feet. She has been standing for longer periods of time due to working 2 jobs. She was unable to maneuver her camera today to look at her lower extremities. Will plan to further evaluate tomorrow at the time of her nurse visit. I suspect her edema is likely dependent in the setting of standing for long periods of time. If this is the case, she would benefit from the use of compression stockings. Echocardiogram in 11/2018 was reassuring and showed a preserved EF with no wall motion abnormalities.  3. OSA - Sleep study in 01/2019 showed severe OSA but she was unable to afford a CPAP at that time. Will need to follow-up on this as untreated sleep apnea could be contributing to her worsening palpitations and elevated BP.   COVID-19 Education: The signs and symptoms of  COVID-19 were discussed with the patient and how to seek care for testing (follow up with PCP or arrange E-visit). The importance of social distancing was discussed today.  Time:   Today, I have spent 22 minutes with the patient with telehealth technology discussing the above problems.     Medication Adjustments/Labs and Tests Ordered: Current medicines are reviewed at length with the patient today.  Concerns regarding medicines are outlined above.   Tests Ordered: No orders of the defined types were placed in this encounter.   Medication Changes: Meds ordered this encounter  Medications  . diltiazem (CARDIZEM CD) 240 MG 24 hr capsule    Sig: Take 1 capsule (240 mg total) by mouth daily.    Dispense:  90 capsule    Refill:  3    Order Specific Question:   Supervising Provider    Answer:   Dorothy Spark [id number]    Follow Up: Nurse Visit tomorrow for EKG and possible monitor placement pending results. IN OFFICE visit with Dr. Lovena Le in 6-8 weeks.   Signed, Erma Heritage, PA-C  06/14/2019 4:50 PM    [facility]

## 2019-06-14 NOTE — Telephone Encounter (Signed)
Will forward to Brittany Strader, PA-C.  

## 2019-06-14 NOTE — Telephone Encounter (Signed)
Pt called wanting to know if she should get FMLA since she sometimes has to leave work early due to having episodes, stated she forgot to ask [place] during her visit.

## 2019-06-14 NOTE — Telephone Encounter (Signed)
Returned pt call. No answer. Left msg to call back.  

## 2019-06-14 NOTE — Patient Instructions (Signed)
Medication Instructions:  Your physician has recommended you make the following change in your medication:  Increase Cardizem CD 240 mg Daily   *If you need a refill on your cardiac medications before your next appointment, please call your pharmacy*  Lab Work: NONE  If you have labs (blood work) drawn today and your tests are completely normal, you will receive your results only by: Marland Kitchen MyChart Message (if you have MyChart) OR . A paper copy in the mail If you have any lab test that is abnormal or we need to change your treatment, we will call you to review the results.  Testing/Procedures: NONE   Follow-Up: At [facility], you and your health needs are our priority.  As part of our continuing mission to provide you with exceptional heart care, we have created designated Provider Care Teams.  These Care Teams include your primary Cardiologist (physician) and Advanced Practice Providers (APPs -  Physician Assistants and Nurse Practitioners) who all work together to provide you with the care you need, when you need it.  Your next appointment:   6-8  week(s)  The format for your next appointment:   In Person  Provider:   [name], MD  Other Instructions Your physician recommends that you schedule a follow-up appointment in: Tomorrow with Nurse Visit for EKG.

## 2019-06-14 NOTE — Telephone Encounter (Signed)
      She did mention FMLA papers while we were on the phone and said had turned these into the office in the past but did not have them completed. I did not see any records of this scanned in [employer] so unsure if this was previously sent to Dr. Lovena Le?  Signed, Erma Heritage, PA-C 06/14/2019, 4:30 PM Pager: [phone]

## 2019-06-15 ENCOUNTER — Other Ambulatory Visit (INDEPENDENT_AMBULATORY_CARE_PROVIDER_SITE_OTHER): Payer: Self-pay

## 2019-06-15 ENCOUNTER — Ambulatory Visit: Payer: No Typology Code available for payment source

## 2019-06-15 ENCOUNTER — Other Ambulatory Visit: Payer: Self-pay

## 2019-06-15 VITALS — BP 126/71 | HR 84 | Temp 97.8°F | Ht 62.0 in

## 2019-06-15 DIAGNOSIS — R002 Palpitations: Secondary | ICD-10-CM

## 2019-06-15 NOTE — Patient Instructions (Addendum)
Medication Instructions:  Your physician recommends that you continue on your current medications as directed. Please refer to the Current Medication list given to you today.  Labwork: none  Testing/Procedures: Your physician has recommended that you wear an event monitor. Event monitors are medical devices that record the heart's electrical activity. Doctors most often [place] these monitors to diagnose arrhythmias. Arrhythmias are problems with the speed or rhythm of the heartbeat. The monitor is a small, portable device. You can wear one while you do your normal daily activities. This is usually used to diagnose what is causing palpitations/syncope (passing out).  14 day   Follow-Up: Your physician recommends that you schedule a follow-up appointment in: we will call with results    Any Other Special Instructions Will Be Listed Below (If Applicable).     If you need a refill on your cardiac medications before your next appointment, please call your pharmacy.

## 2019-06-15 NOTE — Telephone Encounter (Signed)
Pt is coming in office for an EKG/event monitor today. We will see what those show before any FMLA or work notes can be written.

## 2019-06-20 ENCOUNTER — Telehealth: Payer: Self-pay | Admitting: Student

## 2019-06-20 NOTE — Telephone Encounter (Signed)
Returned call to pt. No answer. Left msg to call back.  

## 2019-06-20 NOTE — Telephone Encounter (Signed)
Patient called stating that she is itching, her skin is red. Patches are falling off.

## 2019-06-20 NOTE — Telephone Encounter (Signed)
Spoke with pt that states monitor will not stay on. Offered to change monitor base bridge for less contact to skin. Pt agreed.

## 2019-06-28 ENCOUNTER — Telehealth: Payer: Self-pay | Admitting: *Deleted

## 2019-06-28 NOTE — Telephone Encounter (Signed)
Patient states she has an old machine she got from her family member but it is very old and she needs an up to date one. Patient was encouraged to call her insurance company and ask who they refer their clients out to for dme. Patient states she will call them and get back to me next week 07/02/19.

## 2019-06-28 NOTE — Telephone Encounter (Signed)
-----   Message from Damian Leavell, RN sent at 06/26/2019  8:26 AM EST ----- Regarding: FW: [facility] Sleep study order Can you follow up?  Or forward to someone that can assist? TY Sonia Baller ----- Message ----- From: Erma Heritage, PA-C Sent: 06/26/2019   8:00 AM EST To: Damian Leavell, RN Subject: RE: Jessica Sanchez Sleep study order                   Blythe Stanford,   Yes, by review of records she had one in 01/2019 which showed severe OSA and she could not afford the machine. By review of phone notes from Sydell Axon in 02/2019, [facility] did not accept her insurance. Is there another home health company that might accept hers? Prior notes had also mentioned there were programs that helped to cover CPAP's but I have not heard of those.   Thanks,  [place]  ----- Message ----- From: Damian Leavell, RN Sent: 06/26/2019   7:18 AM EST To: Erma Heritage, PA-C Subject: FW: [facility] Sleep study order                   It looks like Pt already had a sleep study. She just can't afford to get a cpap?  Thank you Sonia Baller ----- Message ----- From: Chapman Moss, RN Sent: 06/25/2019   3:07 PM EST To: [name], MD, Damian Leavell, RN Subject: Jessica Sanchez Sleep study order                       Hi Dr Lovena Le and Sonia Baller,  Per [employer], Itamar sleep study was denied by patient's insurance.  Thank you, Con [place]

## 2019-07-05 ENCOUNTER — Other Ambulatory Visit: Payer: Self-pay

## 2019-07-05 ENCOUNTER — Encounter: Payer: Self-pay | Admitting: Advanced Practice Midwife

## 2019-07-05 ENCOUNTER — Ambulatory Visit (INDEPENDENT_AMBULATORY_CARE_PROVIDER_SITE_OTHER): Payer: Medicaid Other | Admitting: Advanced Practice Midwife

## 2019-07-05 VITALS — BP 140/90 | HR 95 | Ht 62.0 in | Wt 324.5 lb

## 2019-07-05 DIAGNOSIS — Z538 Procedure and treatment not carried out for other reasons: Secondary | ICD-10-CM | POA: Insufficient documentation

## 2019-07-05 DIAGNOSIS — Z30430 Encounter for insertion of intrauterine contraceptive device: Secondary | ICD-10-CM | POA: Diagnosis not present

## 2019-07-05 DIAGNOSIS — Z113 Encounter for screening for infections with a predominantly sexual mode of transmission: Secondary | ICD-10-CM

## 2019-07-05 DIAGNOSIS — Z975 Presence of (intrauterine) contraceptive device: Secondary | ICD-10-CM | POA: Insufficient documentation

## 2019-07-05 DIAGNOSIS — Z3202 Encounter for pregnancy test, result negative: Secondary | ICD-10-CM

## 2019-07-05 MED ORDER — LEVONORGESTREL 19.5 MCG/DAY IU IUD: INTRAUTERINE_SYSTEM | Freq: Once | INTRAUTERINE | Status: AC

## 2019-07-05 MED ORDER — DOXYCYCLINE HYCLATE 100 MG PO CAPS: 100.0000 mg | ORAL_CAPSULE | Freq: Two times a day (BID) | ORAL | 0 refills | Status: DC

## 2019-07-05 MED ORDER — NAPROXEN 500 MG PO TABS: 500.0000 mg | ORAL_TABLET | Freq: Two times a day (BID) | ORAL | 1 refills | Status: DC | PRN

## 2019-07-05 NOTE — Addendum Note (Signed)
Addended by: Linton Rump on: 07/05/2019 03:47 PM   Modules accepted: Orders

## 2019-07-05 NOTE — Progress Notes (Signed)
Jessica Sanchez is a 34 y.o. year old African American female   who presents for removal and replacement of her Mirena IUD. The new IUD is Stacie Ac[place] It has been 5 years since her previous IUD placement. She is amenorrheic except for 2 weeks ago when she had "a big blood clot come out"   The risks and benefits of the method and placement have been thouroughly reviewed with the patient and all questions were answered.  Specifically the patient is aware of failure rate of 05/998, expulsion of the IUD and of possible perforation.  The patient is aware of irregular bleeding due to the method and understands the incidence of irregular bleeding diminishes with time.  Time out was performed.  A Graves speculum was placed.  The cervix was prepped using Betadine. The strings were found to be not visible.The cervix was then grasped with a tenaculum and the uterus was sounded to 8 cm. Despite multiple attempts at removal by myself and Dr. Elonda Husky, the IUD was not able to be removed (could never grasp it)  The IUD was inserted to 8 cm.  It was pulled back 1 cm and the IUD was disengaged. After 15 seconds, the IUD was placed in the fundus.  The strings were trimmed to 3 cm.  Sonogram was performed and the proper placement of the IUD was verified. The original IUD was not there, presumably (d/t bleeding pattern) fell out 2 weeks ago w/blood clot"  Dr Elonda Husky aware, no further testing recommended. . The patient was instructed on signs and symptoms of infection and to check for the strings after each menses or each month.  The patient is to refrain from intercourse for 3 days.

## 2019-07-05 NOTE — Patient Instructions (Signed)
IUD PLACEMENT POST-PROCEDURE INSTRUCTIONS  1. You may take Ibuprofen, Aleve or Tylenol for pain if needed.  Cramping should resolve within in 24 hours.  2. You may have a small amount of spotting.  You should wear a mini pad for the next few days  3.    Nothing in the vagina for 3 days (tampons or intercourse)  4.  You need to call if you have a fever (more than 100.4), any moderate to severe pelvic pain, fever, or foul smelling vaginal discharge.  Irregular bleeding is common the first several months after having an IUD placed. You do not need to call for this reason unless you are concerned.  5. Shower or bathe as normal  6.  You should have a follow-up appointment in 4-8 weeks for a re-check to make sure you are not having any problems.  

## 2019-07-06 LAB — HIV ANTIBODY (ROUTINE TESTING W REFLEX): HIV Screen 4th Generation wRfx: NONREACTIVE

## 2019-07-06 LAB — RPR: RPR Ser Ql: NONREACTIVE

## 2019-07-08 ENCOUNTER — Emergency Department (HOSPITAL_COMMUNITY): Payer: No Typology Code available for payment source

## 2019-07-08 ENCOUNTER — Observation Stay (HOSPITAL_COMMUNITY)
Admission: EM | Admit: 2019-07-08 | Discharge: 2019-07-09 | Disposition: A | Discharge status: Home / Self Care | Payer: Self-pay | Attending: Internal Medicine | Admitting: Internal Medicine

## 2019-07-08 ENCOUNTER — Encounter (HOSPITAL_COMMUNITY): Payer: Self-pay | Admitting: *Deleted

## 2019-07-08 ENCOUNTER — Other Ambulatory Visit: Payer: Self-pay

## 2019-07-08 DIAGNOSIS — K219 Gastro-esophageal reflux disease without esophagitis: Secondary | ICD-10-CM | POA: Insufficient documentation

## 2019-07-08 DIAGNOSIS — Z91013 Allergy to seafood: Secondary | ICD-10-CM | POA: Insufficient documentation

## 2019-07-08 DIAGNOSIS — Z79899 Other long term (current) drug therapy: Secondary | ICD-10-CM | POA: Insufficient documentation

## 2019-07-08 DIAGNOSIS — I471 Supraventricular tachycardia, unspecified: Secondary | ICD-10-CM | POA: Diagnosis present

## 2019-07-08 DIAGNOSIS — R7989 Other specified abnormal findings of blood chemistry: Secondary | ICD-10-CM

## 2019-07-08 DIAGNOSIS — R748 Abnormal levels of other serum enzymes: Secondary | ICD-10-CM | POA: Insufficient documentation

## 2019-07-08 DIAGNOSIS — Z6841 Body Mass Index (BMI) 40.0 and over, adult: Secondary | ICD-10-CM | POA: Insufficient documentation

## 2019-07-08 DIAGNOSIS — Z975 Presence of (intrauterine) contraceptive device: Secondary | ICD-10-CM | POA: Insufficient documentation

## 2019-07-08 DIAGNOSIS — Z20822 Contact with and (suspected) exposure to covid-19: Secondary | ICD-10-CM | POA: Insufficient documentation

## 2019-07-08 DIAGNOSIS — R778 Other specified abnormalities of plasma proteins: Secondary | ICD-10-CM

## 2019-07-08 DIAGNOSIS — Z91048 Other nonmedicinal substance allergy status: Secondary | ICD-10-CM | POA: Insufficient documentation

## 2019-07-08 LAB — COMPREHENSIVE METABOLIC PANEL
ALT: 18 U/L (ref 0–44)
AST: 15 U/L (ref 15–41)
Albumin: 3.7 g/dL (ref 3.5–5.0)
Alkaline Phosphatase: 66 U/L (ref 38–126)
Anion gap: 6 (ref 5–15)
BUN: 14 mg/dL (ref 6–20)
CO2: 25 mmol/L (ref 22–32)
Calcium: 8.9 mg/dL (ref 8.9–10.3)
Chloride: 105 mmol/L (ref 98–111)
Creatinine, Ser: 0.65 mg/dL (ref 0.44–1.00)
GFR calc Af Amer: >60 mL/min (ref >60)
GFR calc non Af Amer: >60 mL/min (ref >60)
Glucose, Bld: 135 mg/dL — ABNORMAL HIGH (ref 70–99)
Potassium: 4 mmol/L (ref 3.5–5.1)
Sodium: 136 mmol/L (ref 135–145)
Total Bilirubin: 0.3 mg/dL (ref 0.3–1.2)
Total Protein: 7.7 g/dL (ref 6.5–8.1)

## 2019-07-08 LAB — CBC WITH DIFFERENTIAL/PLATELET
Abs Immature Granulocytes: 0.04 10*3/uL (ref 0.00–0.07)
Basophils Absolute: 0 10*3/uL (ref 0.0–0.1)
Basophils Relative: 0 %
Eosinophils Absolute: 0.2 10*3/uL (ref 0.0–0.5)
Eosinophils Relative: 2 %
HCT: 41.2 % (ref 36.0–46.0)
Hemoglobin: 12.9 g/dL (ref 12.0–15.0)
Immature Granulocytes: 1 %
Lymphocytes Relative: 37 %
Lymphs Abs: 2.8 10*3/uL (ref 0.7–4.0)
MCH: 27.2 pg (ref 26.0–34.0)
MCHC: 31.3 g/dL (ref 30.0–36.0)
MCV: 86.7 fL (ref 80.0–100.0)
Monocytes Absolute: 0.4 10*3/uL (ref 0.1–1.0)
Monocytes Relative: 6 %
Neutro Abs: 4 10*3/uL (ref 1.7–7.7)
Neutrophils Relative %: 54 %
Platelets: 363 10*3/uL (ref 150–400)
RBC: 4.75 MIL/uL (ref 3.87–5.11)
RDW: 14.1 % (ref 11.5–15.5)
WBC: 7.4 10*3/uL (ref 4.0–10.5)
nRBC: 0 % (ref 0.0–0.2)

## 2019-07-08 LAB — TROPONIN I (HIGH SENSITIVITY): Troponin I (High Sensitivity): 127 ng/L (ref <18)

## 2019-07-08 LAB — GC/CHLAMYDIA PROBE AMP
Chlamydia trachomatis, NAA: NEGATIVE
Neisseria Gonorrhoeae by PCR: NEGATIVE

## 2019-07-08 LAB — MAGNESIUM: Magnesium: 1.9 mg/dL (ref 1.7–2.4)

## 2019-07-08 LAB — HCG, QUANTITATIVE, PREGNANCY: hCG, Beta Chain, Quant, S: <1 m[IU]/mL (ref <5)

## 2019-07-08 MED ORDER — ADENOSINE 6 MG/2ML IV SOLN
6.0000 mg | Freq: Once | INTRAVENOUS | Status: AC
  Administered 2019-07-08: 6 mg via INTRAVENOUS

## 2019-07-08 MED ORDER — ACETAMINOPHEN 325 MG PO TABS
650.0000 mg | ORAL_TABLET | Freq: Four times a day (QID) | ORAL | Status: DC | PRN
  Administered 2019-07-09: 650 mg via ORAL
  Filled 2019-07-08: qty 2

## 2019-07-08 MED ORDER — ADENOSINE 6 MG/2ML IV SOLN
12.0000 mg | Freq: Once | INTRAVENOUS | Status: AC
  Administered 2019-07-08: 12 mg via INTRAVENOUS

## 2019-07-08 MED ORDER — ENOXAPARIN SODIUM 80 MG/0.8ML ~~LOC~~ SOLN
70.0000 mg | Freq: Every day | SUBCUTANEOUS | Status: DC
  Administered 2019-07-09: 70 mg via SUBCUTANEOUS
  Filled 2019-07-08: qty 0.8

## 2019-07-08 MED ORDER — DILTIAZEM HCL 25 MG/5ML IV SOLN
15.0000 mg | Freq: Once | INTRAVENOUS | Status: AC
  Administered 2019-07-08: 15 mg via INTRAVENOUS
  Filled 2019-07-08: qty 5

## 2019-07-08 MED ORDER — SODIUM CHLORIDE 0.9 % IV BOLUS
1000.0000 mL | Freq: Once | INTRAVENOUS | Status: AC
  Administered 2019-07-08: 1000 mL via INTRAVENOUS

## 2019-07-08 MED ORDER — ONDANSETRON HCL 4 MG PO TABS: 4.0000 mg | ORAL_TABLET | Freq: Four times a day (QID) | ORAL | Status: DC | PRN

## 2019-07-08 MED ORDER — DILTIAZEM HCL ER COATED BEADS 120 MG PO CP24
240.0000 mg | ORAL_CAPSULE | Freq: Every day | ORAL | Status: DC
  Administered 2019-07-09: 240 mg via ORAL
  Filled 2019-07-08: qty 2

## 2019-07-08 MED ORDER — ONDANSETRON HCL 4 MG/2ML IJ SOLN
4.0000 mg | Freq: Once | INTRAMUSCULAR | Status: AC
  Administered 2019-07-08: 22:00:00 4 mg via INTRAVENOUS
  Filled 2019-07-08: qty 2

## 2019-07-08 MED ORDER — SODIUM CHLORIDE 0.9 % IV SOLN: INTRAVENOUS | Status: DC

## 2019-07-08 MED ORDER — ONDANSETRON HCL 4 MG/2ML IJ SOLN: 4.0000 mg | Freq: Four times a day (QID) | INTRAMUSCULAR | Status: DC | PRN

## 2019-07-08 MED ORDER — ACETAMINOPHEN 650 MG RE SUPP: 650.0000 mg | Freq: Four times a day (QID) | RECTAL | Status: DC | PRN

## 2019-07-08 MED ORDER — MORPHINE SULFATE (PF) 4 MG/ML IV SOLN
4.0000 mg | Freq: Once | INTRAVENOUS | Status: AC
  Administered 2019-07-08: 4 mg via INTRAVENOUS
  Filled 2019-07-08: qty 1

## 2019-07-08 MED ORDER — ASPIRIN 325 MG PO TABS
325.0000 mg | ORAL_TABLET | Freq: Once | ORAL | Status: AC
  Administered 2019-07-08: 22:00:00 325 mg via ORAL
  Filled 2019-07-08: qty 1

## 2019-07-08 NOTE — ED Notes (Signed)
Date and time results received: 07/08/19 2124  Test: Troponin  Critical Value: 127  Name of Provider Notified: Ria Comment PA  Orders Received? Or Actions Taken?: acknowledged

## 2019-07-08 NOTE — H&P (Signed)
TRH H&P    Patient Demographics:    Jessica Sanchez, is a 34 y.o. female  MRN: [id number]  DOB - January 01, 1986  Admit Date - 07/08/2019  Referring MD/NP/PA: Ileene Musa  Outpatient Primary MD for the patient is Lucia Gaskins, MD  Patient coming from: Home  Chief complaint-chest pain   HPI:    Jessica Sanchez  is a 34 y.o. female, with history of SVT status post radiofrequency ablation in July 2020, came to hospital with chest pain.  Patient says that she developed right-sided chest pain also had fluttering sensation in the heart.  She was having palpitations so she came to hospital.  Patient has had multiple episodes of SVT since radiofrequency ablation last year.  She had a Holter monitor which was removed on 06/29/2019 and she is supposed to follow-up with cardiology as outpatient. In the ED she received 2 doses of adenosine which did not break SVT, finally when she received 15 mg of IV Cardizem SVT broke.  Patient takes Cardizem 240 mg daily at home. She denies shortness of breath. Denies nausea vomiting or diarrhea No previous history of stroke or seizures   Review of systems:    In addition to the HPI above,   All other systems reviewed and are negative.    Past History of the following :    Past Medical History:  Diagnosis Date  . Fibroid (bleeding) (uterine)   . Fibroid, uterine   . H/O metrorrhagia   . IUD (intrauterine device) in place 07/11/2015  . Menorrhagia   . Obesity, Class III, BMI 40-49.9 (morbid obesity) ([place]) 11/11/2018  . SVT (supraventricular tachycardia) ([facility])       Past Surgical History:  Procedure Laterality Date  . HYSTEROSCOPY WITH D & C N/A 09/27/2013   Procedure: DILATATION AND CURETTAGE /HYSTEROSCOPY and insertion of Mirena IUD ;  Surgeon: [name]. Harrington Challenger, MD;  Location: [facility];  Service: Gynecology;  Laterality: N/A;  . MYOMECTOMY  02/03/2011   Procedure: MYOMECTOMY;   Surgeon: [name], MD;  Location: [facility];  Service: Gynecology;  Laterality: N/A;  . SVT ABLATION N/A 11/08/2018   Procedure: SVT ABLATION;  Surgeon: [name], MD;  Location: [facility];  Service: Cardiovascular;  Laterality: N/A;  . TONSILLECTOMY        Social History:      Social History   Tobacco Use  . Smoking status: Never Smoker  . Smokeless tobacco: Never Used  Substance Use Topics  . Alcohol use: Yes    Comment: occasional       Family History :     Family History  Problem Relation Age of Onset  . Cirrhosis Mother   . Diabetes Mother   . Cancer Maternal Grandfather   . Hypertension Sister   . Diabetes Sister   . Anesthesia problems Neg Hx   . Hypotension Neg Hx   . Malignant hyperthermia Neg Hx   . Pseudochol deficiency Neg Hx       Home Medications:   Prior to Admission medications  Medication Sig Start Date End Date Taking? Authorizing Provider  albuterol (PROVENTIL HFA) 108 (90 Base) MCG/ACT inhaler Inhale 1-2 puffs into the lungs every 6 (six) hours as needed for wheezing or shortness of breath.    Yes [provider]  diltiazem (CARDIZEM CD) 240 MG 24 hr capsule Take 1 capsule (240 mg total) by mouth daily. 06/14/19 06/08/20 Yes Strader, Fransisco Hertz, PA-C  doxycycline (VIBRAMYCIN) 100 MG capsule Take 1 capsule (100 mg total) by mouth 2 (two) times daily. 07/05/19  Yes Cresenzo-Dishmon, Joaquim Lai, CNM  levonorgestrel (MIRENA) 20 MCG/24HR IUD 1 each by Intrauterine route once. EVERY 5 YEARS   Yes [provider]  Multiple Vitamins-Calcium (ONE-A-DAY WOMENS PO) Take 1 tablet by mouth daily.   Yes [provider]  naproxen (NAPROSYN) 500 MG tablet Take 1 tablet (500 mg total) by mouth 2 (two) times daily as needed. 07/05/19  Yes Cresenzo-Dishmon, Joaquim Lai, CNM  omeprazole (PRILOSEC) 20 MG capsule Take 1 capsule (20 mg total) by mouth daily. Patient not taking: Reported on 07/05/2019 02/24/19   Ezequiel Essex, MD  apixaban  (ELIQUIS) 5 MG TABS tablet Take 2 tablets (10mg ) twice daily for 7 days, then 1 tablet (5mg ) twice daily 04/20/19 04/20/19  Rodell Perna A, PA-C     Allergies:     Allergies  Allergen Reactions  . Shellfish Allergy Shortness Of Breath, Itching and Swelling    Mouth became swollen, throat started itching, and developed shortness of breath  . Adhesive [Tape] Itching    Depends on the type of medical tape     Physical Exam:   Vitals  Blood pressure (!) 101/54, pulse 95, temperature 98.3 F (36.8 C), temperature source Oral, resp. rate (!) 25, height 5\' 2"  (1.575 m), weight (!) 145.2 kg, SpO2 97 %.  1.  General: Appears in no acute distress  2. Psychiatric: Alert, oriented x3, intact insight and judgment  3. Neurologic: Cranial nerves II through grossly intact, no focal deficit noted  4. HEENMT:  Atraumatic normocephalic, extraocular muscles are intact  5. Respiratory : Clear to auscultation bilaterally, no wheezing or crackles auscultated  6. Cardiovascular : S1-S2, regular, no murmur auscultated  7. Gastrointestinal:  Abdomen is soft, nontender, no organomegaly      Data Review:    CBC Recent Labs  Lab 07/08/19 2000  WBC 7.4  HGB 12.9  HCT 41.2  PLT 363  MCV 86.7  MCH 27.2  MCHC 31.3  RDW 14.1  LYMPHSABS 2.8  MONOABS 0.4  EOSABS 0.2  BASOSABS 0.0   ------------------------------------------------------------------------------------------------------------------  Results for orders placed or performed during the hospital encounter of 07/08/19 (from the past 48 hour(s))  Comprehensive metabolic panel     Status: Abnormal   Collection Time: 07/08/19  8:00 PM  Result Value Ref Range   Sodium 136 135 - 145 mmol/L   Potassium 4.0 3.5 - 5.1 mmol/L   Chloride 105 98 - 111 mmol/L   CO2 25 22 - 32 mmol/L   Glucose, Bld 135 (H) 70 - 99 mg/dL    Comment: Glucose reference range applies only to samples taken after fasting for at least 8 hours.   BUN 14 6 -  20 mg/dL   Creatinine, Ser 0.65 0.44 - 1.00 mg/dL   Calcium 8.9 8.9 - 10.3 mg/dL   Total Protein 7.7 6.5 - 8.1 g/dL   Albumin 3.7 3.5 - 5.0 g/dL   AST 15 15 - 41 U/L   ALT 18 0 - 44 U/L   Alkaline  Phosphatase 66 38 - 126 U/L   Total Bilirubin 0.3 0.3 - 1.2 mg/dL   GFR calc non Af Amer >60 >60 mL/min   GFR calc Af Amer >60 >60 mL/min   Anion gap 6 5 - 15    Comment: Performed at [facility], [address] [place], [place] [address]  CBC with Differential     Status: None   Collection Time: 07/08/19  8:00 PM  Result Value Ref Range   WBC 7.4 4.0 - 10.5 K/uL   RBC 4.75 3.87 - 5.11 MIL/uL   Hemoglobin 12.9 12.0 - 15.0 g/dL   HCT 41.2 36.0 - 46.0 %   MCV 86.7 80.0 - 100.0 fL   MCH 27.2 26.0 - 34.0 pg   MCHC 31.3 30.0 - 36.0 g/dL   RDW 14.1 11.5 - 15.5 %   Platelets 363 150 - 400 K/uL   nRBC 0.0 0.0 - 0.2 %   Neutrophils Relative % 54 %   Neutro Abs 4.0 1.7 - 7.7 K/uL   Lymphocytes Relative 37 %   Lymphs Abs 2.8 0.7 - 4.0 K/uL   Monocytes Relative 6 %   Monocytes Absolute 0.4 0.1 - 1.0 K/uL   Eosinophils Relative 2 %   Eosinophils Absolute 0.2 0.0 - 0.5 K/uL   Basophils Relative 0 %   Basophils Absolute 0.0 0.0 - 0.1 K/uL   Immature Granulocytes 1 %   Abs Immature Granulocytes 0.04 0.00 - 0.07 K/uL    Comment: Performed at [facility], [address] [place], [place] [address]  hCG, quantitative, pregnancy     Status: None   Collection Time: 07/08/19  8:00 PM  Result Value Ref Range   hCG, Beta Chain, Quant, S <1 <5 mIU/mL    Comment:          GEST. AGE      CONC.  (mIU/mL)   <=1 WEEK        5 - 50     2 WEEKS       50 - 500     3 WEEKS       100 - 10,000     4 WEEKS     1,000 - 30,000     5 WEEKS     3,500 - 115,000   6-8 WEEKS     12,000 - 270,000    12 WEEKS     15,000 - 220,000        FEMALE AND NON-PREGNANT FEMALE:     LESS THAN 5 mIU/mL Performed at [facility], [address], [place], [place] [address]   Troponin I (High Sensitivity)     Status: Abnormal    Collection Time: 07/08/19  8:00 PM  Result Value Ref Range   Troponin I (High Sensitivity) 127 (HH) <18 ng/L    Comment: CRITICAL RESULT CALLED TO, READ BACK BY AND VERIFIED WITH: WALKER,T @ 2123 ON 07/08/19 BY JUW Performed at [facility], [address], [place], [place] [address]     Chemistries  Recent Labs  Lab 07/08/19 2000  NA 136  K 4.0  CL 105  CO2 25  GLUCOSE 135*  BUN 14  CREATININE 0.65  CALCIUM 8.9  AST 15  ALT 18  ALKPHOS 66  BILITOT 0.3   ------------------------------------------------------------------------------------------------------------------  ------------------------------------------------------------------------------------------------------------------ GFR: Estimated Creatinine Clearance: 139.1 mL/min (by C-G formula based on SCr of 0.65 mg/dL). Liver Function Tests: Recent Labs  Lab 07/08/19 2000  AST 15  ALT 18  ALKPHOS 66  BILITOT 0.3  PROT 7.7  ALBUMIN 3.7    --------------------------------------------------------------------------------------------------------------- Urine analysis:    Component Value Date/Time   COLORURINE YELLOW 01/16/2019 1322   APPEARANCEUR HAZY (A) 01/16/2019 1322   LABSPEC 1.018 01/16/2019 1322   PHURINE 7.0 01/16/2019 1322   GLUCOSEU NEGATIVE 01/16/2019 1322   HGBUR MODERATE (A) 01/16/2019 1322   BILIRUBINUR NEGATIVE 01/16/2019 1322   KETONESUR NEGATIVE 01/16/2019 1322   PROTEINUR NEGATIVE 01/16/2019 1322   UROBILINOGEN 0.2 11/05/2014 2200   NITRITE NEGATIVE 01/16/2019 1322   LEUKOCYTESUR SMALL (A) 01/16/2019 1322      Imaging Results:    DG Chest Portable 1 View  Result Date: 07/08/2019 CLINICAL DATA:  Chest pain. EXAM: PORTABLE CHEST 1 VIEW COMPARISON:  CT angiogram chest 04/21/2019, chest radiograph 04/20/2019 FINDINGS: Unchanged mild cardiomegaly. No evidence of airspace consolidation within the lungs. No evidence of pleural effusion or pneumothorax. No acute bony abnormality. Overlying  cardiac monitoring leads. IMPRESSION: No evidence of acute cardiopulmonary abnormality. Unchanged mild cardiomegaly. Electronically Signed   By: Kellie Simmering DO   On: 07/08/2019 20:26    My personal review of EKG: Rhythm SVT, repeat EKG after SVT resolved shows junctional rhythm   Assessment & Plan:    Active Problems:   SVT (supraventricular tachycardia) ([place])   1. SVT-patient having recurrent episodes, she is s/p radiofrequency ablation in July 2020.  Currently SVT has resolved after she received 15 mg IV Cardizem in the ED.  We will continue with her home medications of Cardizem to 40 mg daily from tomorrow morning. 2. Chest pain-likely induced by prolonged SVT.  She has mild elevation of troponin likely from demand ischemia.  ED provider discussed with Dr. Charissa Bash cardiology, he recommended admission for trending troponin in the hospital.  No indication for heparin.  Will cycle troponin x2.    DVT Prophylaxis-   Lovenox   AM Labs Ordered, also please review Full Orders  Family Communication: Admission, patients condition and plan of care including tests being ordered have been discussed with the patient  who indicate understanding and agree with the plan and Code Status.  Code Status: Full code  Admission status: Observation Time spent in minutes : 60 minutes   Annely Sliva S Forever Arechiga M.D

## 2019-07-08 NOTE — ED Triage Notes (Signed)
Pt with mid cp since yesterday.  EKG in triage done, SVT 148

## 2019-07-08 NOTE — ED Provider Notes (Signed)
Medical screening examination/treatment/procedure(s) were conducted as a shared visit with non-physician practitioner(s) and myself.  I personally evaluated the patient during the encounter.  EKG Interpretation  Date/Time:  Sunday July 08 2019 19:40:16 EST Ventricular Rate:  148 PR Interval:    QRS Duration: 76 QT Interval:  280 QTC Calculation: 439 R Axis:   -31 Text Interpretation: Supraventricular tachycardia Left axis deviation Abnormal ECG Confirmed by Fredia Sorrow [phone]) on 07/08/2019 8:18:47 PM   Patient seen by me along with the physician assistant.  Patient with SVT.  History of SVT.  Patient is fairly certain no definite was going on today.  There may have been some episodes yesterday.  She had mid chest pain since yesterday.  Patient's had adenosine in the past.  Sometimes it works sometimes it has and sometimes patients need diltiazem a.m. treatment and/or drip.  And sometimes admission.  Patient treated with 6 mg of adenosine patient was on 2 L of oxygen cardiac monitoring ongoing crash cart there.  Patient had a pause and then went back into SVT.  We opted to try 12 mg.  Patient had a longer pause then went back into SVT.  Patient received a bolus of diltiazem a.m.  And did convert back to normal sinus.  We will continue to monitor her.  If she stays in normal sinus can probably be discharged home.  Because it is not new.  If she goes back in the SVT she will have to be admitted and started on diltiazem drip.   patient's troponins 127.  May be secondary just to the rapid heart rate.  But this will partly warrant admission and observation overnight.  CRITICAL CARE Performed by: Fredia Sorrow Total critical care time: 30 minutes Critical care time was exclusive of separately billable procedures and treating other patients. Critical care was necessary to treat or prevent imminent or life-threatening deterioration. Critical care was time spent personally by me on the  following activities: development of treatment plan with patient and/or surrogate as well as nursing, discussions with consultants, evaluation of patient's response to treatment, examination of patient, obtaining history from patient or surrogate, ordering and performing treatments and interventions, ordering and review of laboratory studies, ordering and review of radiographic studies, pulse oximetry and re-evaluation of patient's condition.   Fredia Sorrow, MD 07/08/19 2126

## 2019-07-08 NOTE — ED Provider Notes (Signed)
[facility] EMERGENCY DEPARTMENT Provider Note   CSN: [id number] Arrival date & time: 07/08/19  1926     History Chief Complaint  Patient presents with  . Chest Pain    Jessica Sanchez is a 34 y.o. female past medical history of IUD, SVT who presents for evaluation of chest pain that began yesterday.  She reports that while she was at work, she started developing some right-sided chest pain.  She describes it as some sharp pain as well as some heaviness.  She did file she got nauseous and sweaty with the pain.  She states that she sat down and rested and the pain improved.  Patient states that throughout the night, she felt a fluttering sensation in her heart but no pain.  She reports that today, the pain returned and she felt like she was having palpitations.  She does state that she feels like she is having some shortness of breath that began today.  She has a history of SVT and states that this feels consistent.  She had seen cardiology and had a monitor that she got removed on 06/29/2019.  She states she has not smoked in the last 2 months.  She denies any other drug use.  No recent fevers, cough.  She denies any abdominal pain.  The history is provided by the patient.       Past Medical History:  Diagnosis Date  . Fibroid (bleeding) (uterine)   . Fibroid, uterine   . H/O metrorrhagia   . IUD (intrauterine device) in place 07/11/2015  . Menorrhagia   . Obesity, Class III, BMI 40-49.9 (morbid obesity) ([place]) 11/11/2018  . SVT (supraventricular tachycardia) [facility])     Patient Active Problem List   Diagnosis Date Noted  . Attempted IUD removal, unsuccessful 07/05/2019  . Encounter for IUD insertion 07/05/2019  . Gastroesophageal reflux disease   . Nonspecific chest pain 11/11/2018  . Obesity, Class III, BMI 40-49.9 (morbid obesity) ([place]) 11/11/2018  . Elevated d-dimer 11/11/2018  . SVT (supraventricular tachycardia) ([place]) 10/04/2018  . IUD (intrauterine device) in place 07/11/2015    . Other disorder of menstruation and other abnormal bleeding from female genital tract 10/30/2012    Past Surgical History:  Procedure Laterality Date  . HYSTEROSCOPY WITH D & C N/A 09/27/2013   Procedure: DILATATION AND CURETTAGE /HYSTEROSCOPY and insertion of Mirena IUD ;  Surgeon: [name]. Harrington Challenger, MD;  Location: [facility];  Service: Gynecology;  Laterality: N/A;  . MYOMECTOMY  02/03/2011   Procedure: MYOMECTOMY;  Surgeon: [name], MD;  Location: [facility];  Service: Gynecology;  Laterality: N/A;  . SVT ABLATION N/A 11/08/2018   Procedure: SVT ABLATION;  Surgeon: [name], MD;  Location: [facility];  Service: Cardiovascular;  Laterality: N/A;  . TONSILLECTOMY       OB History    Gravida  1   Para      Term      Preterm      AB  1   Living        SAB  1   TAB      Ectopic      Multiple      Live Births              Family History  Problem Relation Age of Onset  . Cirrhosis Mother   . Diabetes Mother   . Cancer Maternal Grandfather   . Hypertension Sister   . Diabetes Sister   .  Anesthesia problems Neg Hx   . Hypotension Neg Hx   . Malignant hyperthermia Neg Hx   . Pseudochol deficiency Neg Hx     Social History   Tobacco Use  . Smoking status: Never Smoker  . Smokeless tobacco: Never Used  Substance Use Topics  . Alcohol use: Yes    Comment: occasional  . Drug use: No    Home Medications Prior to Admission medications   Medication Sig Start Date End Date Taking? Authorizing Provider  albuterol (PROVENTIL HFA) 108 (90 Base) MCG/ACT inhaler Inhale 1-2 puffs into the lungs every 6 (six) hours as needed for wheezing or shortness of breath.    Yes [provider]  diltiazem (CARDIZEM CD) 240 MG 24 hr capsule Take 1 capsule (240 mg total) by mouth daily. 06/14/19 06/08/20 Yes Strader, Fransisco Hertz, PA-C  doxycycline (VIBRAMYCIN) 100 MG capsule Take 1 capsule (100 mg total) by mouth 2 (two) times daily. 07/05/19  Yes Cresenzo-Dishmon,  Joaquim Lai, CNM  levonorgestrel (MIRENA) 20 MCG/24HR IUD 1 each by Intrauterine route once. EVERY 5 YEARS   Yes [provider]  Multiple Vitamins-Calcium (ONE-A-DAY WOMENS PO) Take 1 tablet by mouth daily.   Yes [provider]  naproxen (NAPROSYN) 500 MG tablet Take 1 tablet (500 mg total) by mouth 2 (two) times daily as needed. 07/05/19  Yes Cresenzo-Dishmon, Joaquim Lai, CNM  omeprazole (PRILOSEC) 20 MG capsule Take 1 capsule (20 mg total) by mouth daily. Patient not taking: Reported on 07/05/2019 02/24/19   Ezequiel Essex, MD  apixaban (ELIQUIS) 5 MG TABS tablet Take 2 tablets (10mg ) twice daily for 7 days, then 1 tablet (5mg ) twice daily 04/20/19 04/20/19  Rodell Perna A, PA-C    Allergies    Shellfish allergy and Adhesive [tape]  Review of Systems   Review of Systems  Constitutional: Negative for fever.  Respiratory: Positive for shortness of breath. Negative for cough.   Cardiovascular: Positive for chest pain and palpitations.  Gastrointestinal: Negative for abdominal pain, nausea and vomiting.  Genitourinary: Negative for dysuria and hematuria.  Neurological: Negative for headaches.  All other systems reviewed and are negative.   Physical Exam Updated Vital Signs BP 115/68   Pulse 94   Temp 98.3 F (36.8 C) (Oral)   Resp (!) 21   Ht 5\' 2"  (1.575 m)   Wt (!) 145.2 kg   SpO2 98%   BMI 58.53 kg/m   Physical Exam Vitals and nursing note reviewed.  Constitutional:      Appearance: Normal appearance. She is well-developed.  HENT:     Head: Normocephalic and atraumatic.  Eyes:     General: Lids are normal.     Conjunctiva/sclera: Conjunctivae normal.     Pupils: Pupils are equal, round, and reactive to light.  Cardiovascular:     Rate and Rhythm: Regular rhythm. Tachycardia present.     Pulses: Normal pulses.          Radial pulses are 2+ on the right side and 2+ on the left side.       Dorsalis pedis pulses are 2+ on the right side.     Heart sounds:  Normal heart sounds. No murmur. No friction rub. No gallop.   Pulmonary:     Effort: Pulmonary effort is normal.     Breath sounds: Normal breath sounds.     Comments: Lungs clear to auscultation bilaterally.  Symmetric chest rise.  No wheezing, rales, rhonchi. Abdominal:     Palpations: Abdomen is soft. Abdomen is  not rigid.     Tenderness: There is no abdominal tenderness. There is no guarding.  Musculoskeletal:        General: Normal range of motion.     Cervical back: Full passive range of motion without pain.  Skin:    General: Skin is warm and dry.     Capillary Refill: Capillary refill takes less than 2 seconds.  Neurological:     Mental Status: She is alert and oriented to person, place, and time.  Psychiatric:        Speech: Speech normal.     ED Results / Procedures / Treatments   Labs (all labs ordered are listed, but only abnormal results are displayed) Labs Reviewed  COMPREHENSIVE METABOLIC PANEL - Abnormal; Notable for the following components:      Result Value   Glucose, Bld 135 (*)    All other components within normal limits  TROPONIN I (HIGH SENSITIVITY) - Abnormal; Notable for the following components:   Troponin I (High Sensitivity) 127 (*)    All other components within normal limits  SARS CORONAVIRUS 2 (TAT 6-24 HRS)  CBC WITH DIFFERENTIAL/PLATELET  HCG, QUANTITATIVE, PREGNANCY  TROPONIN I (HIGH SENSITIVITY)    EKG EKG Interpretation  Date/Time:  Sunday July 08 2019 21:07:45 EST Ventricular Rate:  106 PR Interval:    QRS Duration: 79 QT Interval:  308 QTC Calculation: 409 R Axis:   45 Text Interpretation: Junctional tachycardia Low voltage, precordial leads Confirmed by Fredia Sorrow [phone]) on 07/08/2019 9:38:34 PM   Radiology DG Chest Portable 1 View  Result Date: 07/08/2019 CLINICAL DATA:  Chest pain. EXAM: PORTABLE CHEST 1 VIEW COMPARISON:  CT angiogram chest 04/21/2019, chest radiograph 04/20/2019 FINDINGS: Unchanged mild  cardiomegaly. No evidence of airspace consolidation within the lungs. No evidence of pleural effusion or pneumothorax. No acute bony abnormality. Overlying cardiac monitoring leads. IMPRESSION: No evidence of acute cardiopulmonary abnormality. Unchanged mild cardiomegaly. Electronically Signed   By: Kellie Simmering DO   On: 07/08/2019 20:26    Procedures .Critical Care Performed by: Volanda Napoleon, PA-C Authorized by: Volanda Napoleon, PA-C   Critical care provider statement:    Critical care time (minutes):  35   Critical care was necessary to treat or prevent imminent or life-threatening deterioration of the following conditions:  Cardiac failure   Critical care was time spent personally by me on the following activities:  Discussions with consultants, evaluation of patient's response to treatment, examination of patient, ordering and performing treatments and interventions, ordering and review of laboratory studies, ordering and review of radiographic studies, pulse oximetry, re-evaluation of patient's condition, obtaining history from patient or surrogate and review of old charts   (including critical care time)  Medications Ordered in ED Medications  sodium chloride 0.9 % bolus 1,000 mL (0 mLs Intravenous Stopped 07/08/19 2113)  adenosine (ADENOCARD) 6 MG/2ML injection 6 mg (6 mg Intravenous Given 07/08/19 2023)  adenosine (ADENOCARD) 6 MG/2ML injection 12 mg (12 mg Intravenous Given 07/08/19 2025)  diltiazem (CARDIZEM) injection 15 mg (15 mg Intravenous Given 07/08/19 2108)  sodium chloride 0.9 % bolus 1,000 mL (1,000 mLs Intravenous New Bag/Given 07/08/19 2108)  aspirin tablet 325 mg (325 mg Oral Given 07/08/19 2154)  morphine 4 MG/ML injection 4 mg (4 mg Intravenous Given 07/08/19 2155)  ondansetron (ZOFRAN) injection 4 mg (4 mg Intravenous Given 07/08/19 2155)    ED Course  I have reviewed the triage vital signs and the nursing notes.  Pertinent labs & imaging results  that were available  during my care of the patient were reviewed by me and considered in my medical decision making (see chart for details).    MDM Rules/Calculators/A&P                      34 year old female past medical history of SVT who presents for evaluation of chest pain that began yesterday.  Associated with palpitations and shortness of breath.  States this feels consistent with previous episodes of SVT.  On initial arrival, she is afebrile.  Her pulse is ranging between 140-150.  Vitals stable.  Attempted to do Valsalva at bedside with no improvement in symptoms.  Plan to check labs, chest x-ray.  Attempted adenosine x2 with no break in SVT.  She had a small pause with the 6 mg in the low back today SVT.  The 12 mg was attempted with same results.  We will give her diltiazem bolus.  CMP shows normal BUN and creatinine.  CBC shows no leukocytosis or anemia. CXR negative for any acute abnormalities. Troponin is 127.    9:12 PM: Patient had a break with SVT and is now with heart rate of 106 after diltiazem bolus.  We will plan to continue monitoring.  Discussed patient with Dr. Charissa Bash (Cardiology).  He recommends medical admission for trending her delta troponins.  No indication for heparin at this time.  Feels like the troponin is likely result of SVT that she had been sustained in.  Cardiology can consult as needed.  Discussed patient with Dr. Darrick Meigs (hospitalist) who accepts patient for admission.   Portions of this note were generated with Lobby[employer]. Dictation errors may occur despite best attempts at proofreading.  Final Clinical Impression(s) / ED Diagnoses Final diagnoses:  SVT (supraventricular tachycardia) ([place])  Elevated troponin    Rx / DC Orders ED Discharge Orders    None       Desma Mcgregor 07/08/19 2232    Fredia Sorrow, MD 07/09/19 1550

## 2019-07-09 ENCOUNTER — Encounter (HOSPITAL_COMMUNITY): Payer: Self-pay | Admitting: Family Medicine

## 2019-07-09 DIAGNOSIS — Z6841 Body Mass Index (BMI) 40.0 and over, adult: Secondary | ICD-10-CM

## 2019-07-09 DIAGNOSIS — K219 Gastro-esophageal reflux disease without esophagitis: Secondary | ICD-10-CM

## 2019-07-09 DIAGNOSIS — R778 Other specified abnormalities of plasma proteins: Secondary | ICD-10-CM

## 2019-07-09 LAB — CBC
HCT: 35.3 % — ABNORMAL LOW (ref 36.0–46.0)
Hemoglobin: 10.9 g/dL — ABNORMAL LOW (ref 12.0–15.0)
MCH: 27.3 pg (ref 26.0–34.0)
MCHC: 30.9 g/dL (ref 30.0–36.0)
MCV: 88.3 fL (ref 80.0–100.0)
Platelets: 313 10*3/uL (ref 150–400)
RBC: 4 MIL/uL (ref 3.87–5.11)
RDW: 14.3 % (ref 11.5–15.5)
WBC: 6 10*3/uL (ref 4.0–10.5)
nRBC: 0 % (ref 0.0–0.2)

## 2019-07-09 LAB — COMPREHENSIVE METABOLIC PANEL
ALT: 15 U/L (ref 0–44)
AST: 11 U/L — ABNORMAL LOW (ref 15–41)
Albumin: 3.1 g/dL — ABNORMAL LOW (ref 3.5–5.0)
Alkaline Phosphatase: 50 U/L (ref 38–126)
Anion gap: 6 (ref 5–15)
BUN: 14 mg/dL (ref 6–20)
CO2: 24 mmol/L (ref 22–32)
Calcium: 8 mg/dL — ABNORMAL LOW (ref 8.9–10.3)
Chloride: 106 mmol/L (ref 98–111)
Creatinine, Ser: 0.61 mg/dL (ref 0.44–1.00)
GFR calc Af Amer: >60 mL/min (ref >60)
GFR calc non Af Amer: >60 mL/min (ref >60)
Glucose, Bld: 123 mg/dL — ABNORMAL HIGH (ref 70–99)
Potassium: 4.1 mmol/L (ref 3.5–5.1)
Sodium: 136 mmol/L (ref 135–145)
Total Bilirubin: 0.4 mg/dL (ref 0.3–1.2)
Total Protein: 6.1 g/dL — ABNORMAL LOW (ref 6.5–8.1)

## 2019-07-09 LAB — TROPONIN I (HIGH SENSITIVITY)
Troponin I (High Sensitivity): 57 ng/L — ABNORMAL HIGH (ref <18)
Troponin I (High Sensitivity): 37 ng/L — ABNORMAL HIGH (ref <18)
Troponin I (High Sensitivity): 26 ng/L — ABNORMAL HIGH (ref <18)

## 2019-07-09 LAB — SARS CORONAVIRUS 2 (TAT 6-24 HRS): SARS Coronavirus 2: NEGATIVE

## 2019-07-09 MED ORDER — OMEPRAZOLE 20 MG PO CPDR: 20.0000 mg | DELAYED_RELEASE_CAPSULE | Freq: Every day | ORAL | 1 refills | Status: DC

## 2019-07-09 NOTE — Consult Note (Addendum)
Cardiology Consultation:   Patient ID: Jessica Sanchez MRN: [id number]; DOB: 03-09-1986  Admit date: 07/08/2019 Date of Consult: 07/09/2019  Primary Care Provider: Lucia Gaskins, MD Primary Cardiologist: Kate Sable, MD  Primary Electrophysiologist:  [name], MD     Patient Profile:   Jessica Sanchez is a 34 y.o. female with a hx of SVT S/P ablation 11/2018 who is being seen today for the evaluation of recurrent SVT and elevated troponinsat the request of Dyann Kief.  History of Present Illness:   [name] is a 34 yo female with history of SVT S/P ablation 11/2018 admitted with recurrent SVT converted with IV adenosine. She had some chest pain and troponins 57,37,26. EKG without acute change.  Patient had telemedicine visit with Jessica Pho PA-C 06/14/19 with increased palpitations and diltiazem increased to 240 mg daily and monitor placed. Also having lower extremity edema felt dependent from standing working 2 jobs. Echo 11/2018 normal LVEF. Diagnosed with severe OSA 01/2019 onCPAP.  Patient says she had chest tightness with SVT similar to what she has had in the past. No exertional chest pain when not in SVT. Tried to mail holter monitor in but it came back saying she had the wrong address. Now feeling better. Does have family history of early CAD with mother MI 38-type 1 diabetic, GM with MI ? Age, sister with arrhythmia.  Heart Pathway Score:  HEAR Score: 2  Past Medical History:  Diagnosis Date  . Fibroid (bleeding) (uterine)   . Fibroid, uterine   . H/O metrorrhagia   . IUD (intrauterine device) in place 07/11/2015  . Menorrhagia   . Obesity, Class III, BMI 40-49.9 (morbid obesity) ([place]) 11/11/2018  . SVT (supraventricular tachycardia) ([facility])     Past Surgical History:  Procedure Laterality Date  . HYSTEROSCOPY WITH D & C N/A 09/27/2013   Procedure: DILATATION AND CURETTAGE /HYSTEROSCOPY and insertion of Mirena IUD ;  Surgeon: Jessica Sanchez. Jessica Challenger, MD;  Location: [facility];   Service: Gynecology;  Laterality: N/A;  . MYOMECTOMY  02/03/2011   Procedure: MYOMECTOMY;  Surgeon: Jessica Buff, MD;  Location: [facility];  Service: Gynecology;  Laterality: N/A;  . SVT ABLATION N/A 11/08/2018   Procedure: SVT ABLATION;  Surgeon: Jessica Lance, MD;  Location: [facility];  Service: Cardiovascular;  Laterality: N/A;  . TONSILLECTOMY       Home Medications:  Prior to Admission medications   Medication Sig Start Date End Date Taking? Authorizing Provider  albuterol (PROVENTIL HFA) 108 (90 Base) MCG/ACT inhaler Inhale 1-2 puffs into the lungs every 6 (six) hours as needed for wheezing or shortness of breath.    Yes [provider]  diltiazem (CARDIZEM CD) 240 MG 24 hr capsule Take 1 capsule (240 mg total) by mouth daily. 06/14/19 06/08/20 Yes Strader, Fransisco Hertz, PA-C  doxycycline (VIBRAMYCIN) 100 MG capsule Take 1 capsule (100 mg total) by mouth 2 (two) times daily. 07/05/19  Yes Cresenzo-Dishmon, Joaquim Lai, CNM  levonorgestrel (MIRENA) 20 MCG/24HR IUD 1 each by Intrauterine route once. EVERY 5 YEARS   Yes [provider]  Multiple Vitamins-Calcium (ONE-A-DAY WOMENS PO) Take 1 tablet by mouth daily.   Yes [provider]  naproxen (NAPROSYN) 500 MG tablet Take 1 tablet (500 mg total) by mouth 2 (two) times daily as needed. 07/05/19  Yes Cresenzo-Dishmon, Joaquim Lai, CNM  omeprazole (PRILOSEC) 20 MG capsule Take 1 capsule (20 mg total) by mouth daily. Patient not taking: Reported on 07/05/2019 02/24/19   Jessica Essex, MD  apixaban (ELIQUIS) 5 MG TABS tablet Take 2 tablets (10mg ) twice daily for 7 days, then 1 tablet (5mg ) twice daily 04/20/19 04/20/19  Jessica Papa, PA-C    Inpatient Medications: Scheduled Meds: . diltiazem  240 mg Oral Daily  . enoxaparin (LOVENOX) injection  70 mg Subcutaneous Daily   Continuous Infusions: . sodium chloride 10 mL/hr at 07/09/19 0033   PRN Meds: acetaminophen **OR** acetaminophen, ondansetron **OR** ondansetron  (ZOFRAN) IV  Allergies:    Allergies  Allergen Reactions  . Shellfish Allergy Shortness Of Breath, Itching and Swelling    Mouth became swollen, throat started itching, and developed shortness of breath  . Adhesive [Tape] Itching    Depends on the type of medical tape    Social History:   Social History   Socioeconomic History  . Marital status: Single    Spouse name: Not on file  . Number of children: Not on file  . Years of education: Not on file  . Highest education level: Not on file  Occupational History  . Not on file  Tobacco Use  . Smoking status: Never Smoker  . Smokeless tobacco: Never Used  Substance and Sexual Activity  . Alcohol use: Yes    Comment: occasional  . Drug use: No  . Sexual activity: Yes    Birth control/protection: I.U.D.  Other Topics Concern  . Not on file  Social History Narrative  . Not on file   Social Determinants of Health   Financial Resource Strain:   . Difficulty of Paying Living Expenses: Not on file  Food Insecurity:   . Worried About Charity fundraiser in the Last Year: Not on file  . Ran Out of Food in the Last Year: Not on file  Transportation Needs:   . Lack of Transportation (Medical): Not on file  . Lack of Transportation (Non-Medical): Not on file  Physical Activity:   . Days of Exercise per Week: Not on file  . Minutes of Exercise per Session: Not on file  Stress:   . Feeling of Stress : Not on file  Social Connections:   . Frequency of Communication with Friends and Family: Not on file  . Frequency of Social Gatherings with Friends and Family: Not on file  . Attends Religious Services: Not on file  . Active Member of Clubs or Organizations: Not on file  . Attends Archivist Meetings: Not on file  . Marital Status: Not on file  Intimate Partner Violence:   . Fear of Current or Ex-Partner: Not on file  . Emotionally Abused: Not on file  . Physically Abused: Not on file  . Sexually Abused: Not on file     Family History:     Family History  Problem Relation Age of Onset  . Cirrhosis Mother   . Diabetes Mother   . Cancer Maternal Grandfather   . Hypertension Sister   . Diabetes Sister   . Anesthesia problems Neg Hx   . Hypotension Neg Hx   . Malignant hyperthermia Neg Hx   . Pseudochol deficiency Neg Hx      ROS:  Please see the history of present illness.  Review of Systems  Constitution: Negative.  HENT: Negative.   Eyes: Negative.   Cardiovascular: Positive for chest pain, leg swelling and palpitations.  Respiratory: Positive for sleep disturbances due to breathing and snoring.   Hematologic/Lymphatic: Negative.   Musculoskeletal: Negative.  Negative for joint pain.  Gastrointestinal: Negative.   Genitourinary: Negative.  Neurological: Negative.    All other ROS reviewed and negative.     Physical Exam/Data:   Vitals:   07/08/19 2300 07/08/19 2330 07/08/19 2353 07/09/19 0519  BP: (!) 101/54 (!) 104/56 112/64 (!) 104/57  Pulse: 95 92 87 88  Resp: (!) 25 17 18 18   Temp:   98.4 F (36.9 C) 98.2 F (36.8 C)  TempSrc:   Oral Oral  SpO2: 97% 97% 99% 99%  Weight:    (!) 149.2 kg  Height:    5\' 2"  (1.575 m)    Intake/Output Summary (Last 24 hours) at 07/09/2019 1022 Last data filed at 07/09/2019 0900 Gross per 24 hour  Intake 270 ml  Output --  Net 270 ml   Last 3 Weights 07/09/2019 07/08/2019 07/05/2019  Weight (lbs) 328 lb 14.8 oz 320 lb 324 lb 8 oz  Weight (kg) 149.2 kg 145.151 kg 147.192 kg     Body mass index is 60.16 kg/m.  General: Obese, in no acute distress  HEENT: normal Lymph: no adenopathy Neck: no JVD Endocrine:  No thryomegaly Vascular: No carotid bruits; FA pulses 2+ bilaterally without bruits  Cardiac:  normal S1, S2; RRR; no murmur   Lungs:  clear to auscultation bilaterally, no wheezing, rhonchi or rales  Abd: soft, nontender, no hepatomegaly  Ext: no edema Musculoskeletal:  No deformities, BUE and BLE strength normal and equal Skin: warm  and dry  Neuro:  CNs 2-12 intact, no focal abnormalities noted Psych:  Normal affect   EKG:  The EKG was personally reviewed and demonstrates: SVT 148/m f/u EKG NSR Telemetry:  Telemetry was personally reviewed and demonstrates:  NSR  Relevant CV Studies:  Echo 11/2018 IMPRESSIONS     1. The left ventricle has normal systolic function with an ejection  fraction of 60-65%. The cavity size was normal. Left ventricular diastolic  parameters were normal. No evidence of left ventricular regional wall  motion abnormalities.   2. The right ventricle has normal systolic function. The cavity was  normal. There is no increase in right ventricular wall thickness. Right  ventricular systolic pressure could not be assessed.   3. There is no evidence of pericardial effusion.    Laboratory Data:  High Sensitivity Troponin:   Recent Labs  Lab 07/08/19 2000 07/08/19 2300 07/09/19 0103 07/09/19 0248  TROPONINIHS 127* 57* 37* 26*     Chemistry Recent Labs  Lab 07/08/19 2000 07/09/19 0103  NA 136 136  K 4.0 4.1  CL 105 106  CO2 25 24  GLUCOSE 135* 123*  BUN 14 14  CREATININE 0.65 0.61  CALCIUM 8.9 8.0*  GFRNONAA >60 >60  GFRAA >60 >60  ANIONGAP 6 6    Recent Labs  Lab 07/08/19 2000 07/09/19 0103  PROT 7.7 6.1*  ALBUMIN 3.7 3.1*  AST 15 11*  ALT 18 15  ALKPHOS 66 50  BILITOT 0.3 0.4   Hematology Recent Labs  Lab 07/08/19 2000 07/09/19 0103  WBC 7.4 6.0  RBC 4.75 4.00  HGB 12.9 10.9*  HCT 41.2 35.3*  MCV 86.7 88.3  MCH 27.2 27.3  MCHC 31.3 30.9  RDW 14.1 14.3  PLT 363 313   BNPNo results for input(s): BNP, PROBNP in the last 168 hours.  DDimer No results for input(s): DDIMER in the last 168 hours.   Radiology/Studies:  DG Chest Portable 1 View  Result Date: 07/08/2019 CLINICAL DATA:  Chest pain. EXAM: PORTABLE CHEST 1 VIEW COMPARISON:  CT angiogram chest 04/21/2019, chest radiograph 04/20/2019  FINDINGS: Unchanged mild cardiomegaly. No evidence of airspace  consolidation within the lungs. No evidence of pleural effusion or pneumothorax. No acute bony abnormality. Overlying cardiac monitoring leads. IMPRESSION: No evidence of acute cardiopulmonary abnormality. Unchanged mild cardiomegaly. Electronically Signed   By: Kellie Simmering DO   On: 07/08/2019 20:26       HEAR Score (for undifferentiated chest pain):  HEAR Score: 2    Assessment and Plan:   1. Recurrent SVT converted with adenosine x 2  and diltiazem 15 mg on diltiazem 240 mg daily at home. Maintaining NSR. Ok to discharge. I asked her to mail her monitor in. 2. Chest pain with elevated troponins most likely demand ischemia in the setting of SVT.No exertional symptoms at home. Does have family history of early CAD with mother MI 75 yo(Type 1 DM). Can evaluate further as an outpatient. 3. History of PSVT S/P ablation of AVNRTn 11/2018 4. OSA-severe 01/2019 on CPAP 5. Obesity  [facility] will sign off.   Medication Recommendations:  Diltiazem 240 mg daily Other recommendations (labs, testing, etc):  Mail monitor back Follow up as an outpatient:  Dr. Lovena Le March 25 as scheduled  For questions or updates, please contact [facility] Please consult [email or web address] for contact info under    Signed, Ermalinda Barrios, PA-C  07/09/2019 10:22 AM    Attending note:  Patient seen and examined.  I reviewed her records and discussed the case with Ms. Vita Barley, I agree with her above findings.  Ms. Serafino presents with recurrent SVT that required conversion with adenosine.  She has a history of AVNRT with dual AV nodal physiology status post slow pathway ablation by Dr. Lovena Le in July 2020.  She has had a previous hospitalization last year after the ablation and additional ER visits.  AV nodal blockers have been uptitrated including Cardizem CD 240 mg daily more recently.  Patient was observed overnight, did have some chest discomfort when she was in rapid SVT, high-sensitivity troponin I levels are  not suggestive of ACS however.  On examination this morning she appears comfortable, afebrile, heart rate in the 80s in sinus rhythm by telemetry which I personally reviewed, blood pressure 104/57.  Lungs are clear without labored breathing.  Cardiac exam reveals RRR without gallop.  Lab work shows potassium 4.1, BUN 14, creatinine 0.61, high-sensitivity troponin I 127 down to 26.  Hemoglobin 10.9, platelets 313.  Agree with continuing Cardizem CD 240 mg daily for now.  She will return the recent cardiac monitor for further review and keep office follow-up scheduled with Dr. Lovena Le on March 25.  May be a good candidate for repeat EP study to exclude residual pathway.  Satira Sark, M.D., F.A.C.C.

## 2019-07-09 NOTE — Discharge Summary (Signed)
Physician Discharge Summary  Jessica Sanchez [id number] DOB: 01/19/1986 DOA: 07/08/2019  PCP: Lucia Gaskins, MD  Admit date: 07/08/2019 Discharge date: 07/09/2019  Time spent: 35 minutes  Recommendations for Outpatient Follow-up:  1. Repeat basic metabolic panel to evaluate lites and renal function. 2. Continue assisting patient with aggressive weight loss management; referral to bariatric clinic recommended.   Discharge Diagnoses:  Active Problems:   SVT (supraventricular tachycardia) (HCC)   Morbid obesity with body mass index (BMI) of 60.0 to 69.9 in adult [facility])   Elevated troponin Gastroesophageal flux disease Chest pain  Discharge Condition: Stable and improved.  Discharged home with instruction to follow-up with PCP in 10 days and to pursue outpatient follow-up with electrophysiology service as previously scheduled on July 26, 2019.  CODE STATUS: Full code   Diet recommendation: Low calorie/heart healthy diet.  Filed Weights   07/08/19 1943 07/09/19 0519  Weight: (!) 145.2 kg (!) 149.2 kg    History of present illness:  As per H&P written by Dr. Darrick Meigs on 07/08/2019 34 y.o. female, with history of SVT status post radiofrequency ablation in July 2020, came to hospital with chest pain.  Patient says that she developed right-sided chest pain also had fluttering sensation in the heart.  She was having palpitations so she came to hospital.  Patient has had multiple episodes of SVT since radiofrequency ablation last year.  She had a Holter monitor which was removed on 06/29/2019 and she is supposed to follow-up with cardiology as outpatient. In the ED she received 2 doses of adenosine which did not break SVT, finally when she received 15 mg of IV Cardizem SVT broke.  Patient takes Cardizem 240 mg daily at home. She denies shortness of breath. Denies nausea vomiting or diarrhea No previous history of stroke or seizures  Hospital Course:  1-SVT -Patient reports having recurrent  episode of SVT she is a status post radiofrequency ablation in July 2020 -Patient require 2 doses of adenosine and 1 IV push 15 mg of Cardizem to terminate SVT episode. -Evaluated by cardiology service has recommended continue home extended release Cardizem at current dose and outpatient follow-up, as already scheduled to see electrophysiologist on March 25, 202. -Patient advised to maintain adequate hydration.  2-chest pain -With mild elevation in her troponins during episode of SVT; this is most likely suggesting demand ischemia no acute heart attack. -Renal discharge not really having much of discomfort -Continue patient follow-up with cardiology service.  3-morbid obesity -Body mass index is 60.16 kg/m. -Low calorie diet, portion control and increase physical activity discussed with patient.  4-after several reflux disease/GI prophylaxis -Continue the use of PPI.  Procedures:  See below for x-ray reports.  Consultations:  Cardiology service  Discharge Exam: Vitals:   07/09/19 0519 07/09/19 1302  BP: (!) 104/57 124/69  Pulse: 88 90  Resp: 18 20  Temp: 98.2 F (36.8 C) 98.2 F (36.8 C)  SpO2: 99% 97%    General: Morbidly obese on examination; currently reporting no chest pain, nausea or vomiting.  Good oxygen saturation on room air.  Telemetry evaluated and demonstrating heart rate in the mid 80s and sinus rhythm. Cardiovascular: RRR, no rubs, no gallops, unable to assess JVD with body habitus. Respiratory: Good air movement bilaterally; normal respiratory effort.  No using accessory muscle. Abdomen: Obese, soft, nontender, positive bowel sounds Extremities: no cyanosis or clubbing.   Discharge Instructions   Discharge Instructions    Diet - low sodium heart healthy   Complete by: As  directed    Discharge instructions   Complete by: As directed    Arrange follow-up with PCP in 10 days Follow-up with cardiology/electrophysiologist as already scheduled (July 25, 2019); Dr. Lovena Le. Follow low calorie diet and heart healthy diet. Maintain adequate hydration Take medications as prescribed Do not forget to female your heart monitor to cardiologist office to further review and evaluate any abnormal readings.     Allergies as of 07/09/2019      Reactions   Shellfish Allergy Shortness Of Breath, Itching, Swelling   Mouth became swollen, throat started itching, and developed shortness of breath   Adhesive [tape] Itching   Depends on the type of medical tape      Medication List    STOP taking these medications   doxycycline 100 MG capsule Commonly known as: VIBRAMYCIN     TAKE these medications   diltiazem 240 MG 24 hr capsule Commonly known as: CARDIZEM CD Take 1 capsule (240 mg total) by mouth daily.   levonorgestrel 20 MCG/24HR IUD Commonly known as: MIRENA 1 each by Intrauterine route once. EVERY 5 YEARS   naproxen 500 MG tablet Commonly known as: NAPROSYN Take 1 tablet (500 mg total) by mouth 2 (two) times daily as needed.   omeprazole 20 MG capsule Commonly known as: PRILOSEC Take 1 capsule (20 mg total) by mouth daily.   ONE-A-DAY WOMENS PO Take 1 tablet by mouth daily.   Proventil HFA 108 (90 Base) MCG/ACT inhaler Generic drug: albuterol Inhale 1-2 puffs into the lungs every 6 (six) hours as needed for wheezing or shortness of breath.      Allergies  Allergen Reactions  . Shellfish Allergy Shortness Of Breath, Itching and Swelling    Mouth became swollen, throat started itching, and developed shortness of breath  . Adhesive [Tape] Itching    Depends on the type of medical tape   Follow-up Information    Lucia Gaskins, MD. Schedule an appointment as soon as possible for a visit in 10 day(s).   Specialty: Internal Medicine Contact information: [place] [address] [phone]        Herminio Commons, MD .   Specialty: Cardiology Contact information: [place]  [id number] [phone]        [name], MD .   Specialty: Cardiology Contact information: [place] [address] [phone]           The results of significant diagnostics from this hospitalization (including imaging, microbiology, ancillary and laboratory) are listed below for reference.    Significant Diagnostic Studies: DG Chest Portable 1 View  Result Date: 07/08/2019 CLINICAL DATA:  Chest pain. EXAM: PORTABLE CHEST 1 VIEW COMPARISON:  CT angiogram chest 04/21/2019, chest radiograph 04/20/2019 FINDINGS: Unchanged mild cardiomegaly. No evidence of airspace consolidation within the lungs. No evidence of pleural effusion or pneumothorax. No acute bony abnormality. Overlying cardiac monitoring leads. IMPRESSION: No evidence of acute cardiopulmonary abnormality. Unchanged mild cardiomegaly. Electronically Signed   By: Kellie Simmering DO   On: 07/08/2019 20:26    Microbiology: Recent Results (from the past 240 hour(s))  GC/Chlamydia Probe Amp     Status: None   Collection Time: 07/05/19  4:30 PM   UR  Result Value Ref Range Status   Chlamydia trachomatis, NAA Negative Negative Final   Neisseria Gonorrhoeae by PCR Negative Negative Final  SARS CORONAVIRUS 2 (TAT 6-24 HRS) Nasopharyngeal Nasopharyngeal Swab     Status: None  Collection Time: 07/08/19  9:47 PM   Specimen: Nasopharyngeal Swab  Result Value Ref Range Status   SARS Coronavirus 2 NEGATIVE NEGATIVE Final    Comment: (NOTE) SARS-CoV-2 target nucleic acids are NOT DETECTED. The SARS-CoV-2 RNA is generally detectable in upper and lower respiratory specimens during the acute phase of infection. Negative results do not preclude SARS-CoV-2 infection, do not rule out co-infections with other pathogens, and should not be used as the sole basis for treatment or other patient management decisions. Negative results must be combined with clinical observations, patient history, and epidemiological information.  The expected result is Negative. Fact Sheet for Patients: SugarRoll.be Fact Sheet for Healthcare Providers: https://www.woods-mathews.com/ This test is not yet approved or cleared by the [place] FDA and  has been authorized for detection and/or diagnosis of SARS-CoV-2 by FDA under an Emergency Use Authorization (EUA). This EUA will remain  in effect (meaning this test can be used) for the duration of the COVID-19 declaration under Section 56 4(b)(1) of the Act, 21 U.S.C. section 360bbb-3(b)(1), unless the authorization is terminated or revoked sooner. Performed at [facility], [place] [address], [place], [place] [address]      Labs: Basic Metabolic Panel: Recent Labs  Lab 07/08/19 2000 07/08/19 2300 07/09/19 0103  NA 136  --  136  K 4.0  --  4.1  CL 105  --  106  CO2 25  --  24  GLUCOSE 135*  --  123*  BUN 14  --  14  CREATININE 0.65  --  0.61  CALCIUM 8.9  --  8.0*  MG  --  1.9  --    Liver Function Tests: Recent Labs  Lab 07/08/19 2000 07/09/19 0103  AST 15 11*  ALT 18 15  ALKPHOS 66 50  BILITOT 0.3 0.4  PROT 7.7 6.1*  ALBUMIN 3.7 3.1*   CBC: Recent Labs  Lab 07/08/19 2000 07/09/19 0103  WBC 7.4 6.0  NEUTROABS 4.0  --   HGB 12.9 10.9*  HCT 41.2 35.3*  MCV 86.7 88.3  PLT 363 313    Signed:  Barton Dubois MD.  [facility] 07/09/2019, 1:37 PM

## 2019-07-09 NOTE — Progress Notes (Signed)
Rx Brief note: Lovenox  Wt= 145 kg, CrCl>100 ml/min, BMI = 58  Rx adjusted Lovenox to 70 mg daily in pt with BMI>30  Thanks Dorrene German 07/09/2019 12:00 AM

## 2019-07-09 NOTE — Progress Notes (Signed)
Nsg Discharge Note  Admit Date:  07/08/2019 Discharge date: 07/09/2019   Jessica Sanchez to be D/C'd home per MD order.  AVS completed.  Copy for chart, and copy for patient signed, and dated. Patient/caregiver able to verbalize understanding.  Discharge Medication: Allergies as of 07/09/2019      Reactions   Shellfish Allergy Shortness Of Breath, Itching, Swelling   Mouth became swollen, throat started itching, and developed shortness of breath   Adhesive [tape] Itching   Depends on the type of medical tape      Medication List    STOP taking these medications   doxycycline 100 MG capsule Commonly known as: VIBRAMYCIN     TAKE these medications   diltiazem 240 MG 24 hr capsule Commonly known as: CARDIZEM CD Take 1 capsule (240 mg total) by mouth daily.   levonorgestrel 20 MCG/24HR IUD Commonly known as: MIRENA 1 each by Intrauterine route once. EVERY 5 YEARS   naproxen 500 MG tablet Commonly known as: NAPROSYN Take 1 tablet (500 mg total) by mouth 2 (two) times daily as needed.   omeprazole 20 MG capsule Commonly known as: PRILOSEC Take 1 capsule (20 mg total) by mouth daily.   ONE-A-DAY WOMENS PO Take 1 tablet by mouth daily.   Proventil HFA 108 (90 Base) MCG/ACT inhaler Generic drug: albuterol Inhale 1-2 puffs into the lungs every 6 (six) hours as needed for wheezing or shortness of breath.       Discharge Assessment: Vitals:   07/09/19 0519 07/09/19 1302  BP: (!) 104/57 124/69  Pulse: 88 90  Resp: 18 20  Temp: 98.2 F (36.8 C) 98.2 F (36.8 C)  SpO2: 99% 97%   Skin clean, dry and intact without evidence of skin break down, no evidence of skin tears noted. IV catheter discontinued intact. Site without signs and symptoms of complications - no redness or edema noted at insertion site, patient denies c/o pain - only slight tenderness at site.  Dressing with slight pressure applied.  D/c Instructions-Education: Discharge instructions given to patient/family  with verbalized understanding. D/c education completed with patient/family including follow up instructions, medication list, d/c activities limitations if indicated, with other d/c instructions as indicated by MD - patient able to verbalize understanding, all questions fully answered. Patient instructed to return to ED, call 911, or call MD for any changes in condition.  Patient escorted via [place], and D/C home via private auto.  Zachery Conch, RN 07/09/2019 2:59 PM

## 2019-07-09 NOTE — Plan of Care (Signed)

## 2019-07-13 ENCOUNTER — Telehealth: Payer: Self-pay

## 2019-07-13 MED ORDER — METOPROLOL TARTRATE 25 MG PO TABS: 12.5000 mg | ORAL_TABLET | Freq: Two times a day (BID) | ORAL | 3 refills | Status: DC

## 2019-07-13 NOTE — Telephone Encounter (Signed)
Prescribed dose increase of cardizem to 240 mg on 2/11 visit. Patient was unable to fill and start increased dose until 07/06/19. States her BP is 140/103, 160/105, HR 120 and she had to sit up during the night because while lying down her heart rate was fast and it scared her. She has avoided caffeine for the past 2-3 months.

## 2019-07-13 NOTE — Telephone Encounter (Signed)
I spoke with patient, she agrees to try lopressor 12.5 mg bid, e-scribed to [employer]

## 2019-07-13 NOTE — Telephone Encounter (Signed)
    Reviewed recent Cardiology consult note from 07/09/2019 as she was found to have recurrent SVT at that time and converted with IV Adenosine. Did she return her recent heart monitor?  I do not even see the order for this in CV procedures but she did wear it by review of phone notes.   It appears she was on Lopressor in the past but could not afford the medication at that time. Given her insurance coverage, this should be affordable at this time.  Would recommend starting low-dose Lopressor 12.5 mg twice daily and continue on Cardizem CD at her current dosing.  Keep scheduled follow-up with Dr. Lovena Le for later this month as notes from her hospitalization mention a possible repeat EP study to exclude residual pathway given prior ablation.   Signed, Erma Heritage, PA-C 07/13/2019, 11:02 AM Pager: [phone]

## 2019-07-13 NOTE — Telephone Encounter (Signed)
Pt states she feels she is having a reaction to her medications.  Please call [phone]  Thanks  renee

## 2019-07-25 ENCOUNTER — Encounter: Payer: Self-pay | Admitting: Internal Medicine

## 2019-07-25 ENCOUNTER — Other Ambulatory Visit: Payer: Self-pay

## 2019-07-25 ENCOUNTER — Ambulatory Visit (INDEPENDENT_AMBULATORY_CARE_PROVIDER_SITE_OTHER): Payer: Self-pay | Admitting: Internal Medicine

## 2019-07-25 VITALS — BP 119/78 | HR 88 | Temp 98.8°F | Ht 61.0 in | Wt 328.0 lb

## 2019-07-25 DIAGNOSIS — I471 Supraventricular tachycardia: Secondary | ICD-10-CM

## 2019-07-25 DIAGNOSIS — R0683 Snoring: Secondary | ICD-10-CM

## 2019-07-25 DIAGNOSIS — R400 Somnolence: Secondary | ICD-10-CM

## 2019-07-25 MED ORDER — FLECAINIDE ACETATE 50 MG PO TABS: 50.0000 mg | ORAL_TABLET | Freq: Two times a day (BID) | ORAL | 3 refills | Status: DC

## 2019-07-25 NOTE — Progress Notes (Signed)
HPI Jessica Sanchez returns today for followup. Jessica Sanchez is a pleasant 34 yo woman with SVT who underwent EP study and ablation over a year ago. Jessica Sanchez has had recurrent palpitations. Jessica Sanchez was seen in the ED 2 weeks ago with more SVT. During her procedure Jessica Sanchez had a large Koch's triangle and RF energy application would result in transient heart block limiting how much RF we could deliver. Jessica Sanchez has had recurrent SVT. Jessica Sanchez was given metoprolol in addition to cardizem.  Allergies  Allergen Reactions  . Shellfish Allergy Shortness Of Breath, Itching and Swelling    Mouth became swollen, throat started itching, and developed shortness of breath  . Adhesive [Tape] Itching    Depends on the type of medical tape     Current Outpatient Medications  Medication Sig Dispense Refill  . albuterol (PROVENTIL HFA) 108 (90 Base) MCG/ACT inhaler Inhale 1-2 puffs into the lungs every 6 (six) hours as needed for wheezing or shortness of breath.     . diltiazem (CARDIZEM CD) 240 MG 24 hr capsule Take 1 capsule (240 mg total) by mouth daily. 90 capsule 3  . metoprolol tartrate (LOPRESSOR) 25 MG tablet Take 0.5 tablets (12.5 mg total) by mouth 2 (two) times daily. 60 tablet 3  . Multiple Vitamins-Calcium (ONE-A-DAY WOMENS PO) Take 1 tablet by mouth daily.    . naproxen (NAPROSYN) 500 MG tablet Take 1 tablet (500 mg total) by mouth 2 (two) times daily as needed. 15 tablet 1  . omeprazole (PRILOSEC) 20 MG capsule Take 1 capsule (20 mg total) by mouth daily. 30 capsule 1  . levonorgestrel (MIRENA) 20 MCG/24HR IUD 1 each by Intrauterine route once. EVERY 5 YEARS     No current facility-administered medications for this visit.     Past Medical History:  Diagnosis Date  . Fibroid (bleeding) (uterine)   . Fibroid, uterine   . H/O metrorrhagia   . IUD (intrauterine device) in place 07/11/2015  . Menorrhagia   . Obesity, Class III, BMI 40-49.9 (morbid obesity) ([place]) 11/11/2018  . SVT (supraventricular tachycardia) (HCC)      ROS:   All systems reviewed and negative except as noted in the HPI.   Past Surgical History:  Procedure Laterality Date  . HYSTEROSCOPY WITH D & C N/A 09/27/2013   Procedure: DILATATION AND CURETTAGE /HYSTEROSCOPY and insertion of Mirena IUD ;  Surgeon: [name]. Harrington Challenger, MD;  Location: [facility];  Service: Gynecology;  Laterality: N/A;  . MYOMECTOMY  02/03/2011   Procedure: MYOMECTOMY;  Surgeon: [name], MD;  Location: [facility];  Service: Gynecology;  Laterality: N/A;  . SVT ABLATION N/A 11/08/2018   Procedure: SVT ABLATION;  Surgeon: [name], MD;  Location: [facility];  Service: Cardiovascular;  Laterality: N/A;  . TONSILLECTOMY       Family History  Problem Relation Age of Onset  . Cirrhosis Mother   . Diabetes Mother   . Cancer Maternal Grandfather   . Hypertension Sister   . Diabetes Sister   . Anesthesia problems Neg Hx   . Hypotension Neg Hx   . Malignant hyperthermia Neg Hx   . Pseudochol deficiency Neg Hx      Social History   Socioeconomic History  . Marital status: Single    Spouse name: Not on file  . Number of children: Not on file  . Years of education: Not on file  . Highest education level: Not on file  Occupational History  .  Not on file  Tobacco Use  . Smoking status: Former Research scientist (life sciences)  . Smokeless tobacco: Never Used  Substance and Sexual Activity  . Alcohol use: Yes    Comment: occasional  . Drug use: No  . Sexual activity: Yes    Birth control/protection: I.U.D.  Other Topics Concern  . Not on file  Social History Narrative  . Not on file   Social Determinants of Health   Financial Resource Strain:   . Difficulty of Paying Living Expenses:   Food Insecurity:   . Worried About Charity fundraiser in the Last Year:   . Arboriculturist in the Last Year:   Transportation Needs:   . Film/video editor (Medical):   Marland Kitchen Lack of Transportation (Non-Medical):   Physical Activity:   . Days of Exercise per Week:   . Minutes  of Exercise per Session:   Stress:   . Feeling of Stress :   Social Connections:   . Frequency of Communication with Friends and Family:   . Frequency of Social Gatherings with Friends and Family:   . Attends Religious Services:   . Active Member of Clubs or Organizations:   . Attends Archivist Meetings:   Marland Kitchen Marital Status:   Intimate Partner Violence:   . Fear of Current or Ex-Partner:   . Emotionally Abused:   Marland Kitchen Physically Abused:   . Sexually Abused:      BP 119/78   Pulse 88   Temp 98.8 F (37.1 C)   Ht 5\' 1"  (1.549 m)   Wt (!) 328 lb (148.8 kg)   BMI 61.98 kg/m   Physical Exam:  Morbidly obese appearing 34 yo woman, NAD HEENT: Unremarkable Neck:  6 cm JVD, no thyromegally Lymphatics:  No adenopathy Back:  No CVA tenderness Lungs:  Clear with no wheezes HEART:  Regular rate rhythm, no murmurs, no rubs, no clicks Abd:  soft, positive bowel sounds, no organomegally, no rebound, no guarding Ext:  2 plus pulses, no edema, no cyanosis, no clubbing Skin:  No rashes no nodules Neuro:  CN II through XII intact, motor grossly intact  Assess/Plan: 1. SVT - Jessica Sanchez has had recurrent symptoms. Her anatomy is not great for a successful repeat ablation as Jessica Sanchez is young and her risk for CHB is high. I have asked her to stop her beta blocker and start flecainide 50 bid. I will have her return in 2 weeks for an ECG.  2. Obesity - once we get her heart racing controlled, we will work on this. 3. Probable sleep apnea - Jessica Sanchez needs a sleep study.   [email or web address].

## 2019-07-25 NOTE — Patient Instructions (Signed)
Medication Instructions:  Your physician has recommended you make the following change in your medication:  Stop Taking Metoprolol Tartrate  Start Flecainide 50 mg Two Times Daily   *If you need a refill on your cardiac medications before your next appointment, please call your pharmacy*   Lab Work: NONE   If you have labs (blood work) drawn today and your tests are completely normal, you will receive your results only by: Marland Kitchen MyChart Message (if you have MyChart) OR . A paper copy in the mail If you have any lab test that is abnormal or we need to change your treatment, we will call you to review the results.   Testing/Procedures: NONE    Follow-Up: At [facility], you and your health needs are our priority.  As part of our continuing mission to provide you with exceptional heart care, we have created designated Provider Care Teams.  These Care Teams include your primary Cardiologist (physician) and Advanced Practice Providers (APPs -  Physician Assistants and Nurse Practitioners) who all work together to provide you with the care you need, when you need it.  We recommend signing up for the patient portal called "MyChart".  Sign up information is provided on this After Visit Summary.  MyChart is used to connect with patients for Virtual Visits (Telemedicine).  Patients are able to view lab/test results, encounter notes, upcoming appointments, etc.  Non-urgent messages can be sent to your provider as well.   To learn more about what you can do with MyChart, go to [email or web address].    Your next appointment:   5 week(s)  The format for your next appointment:   In Person  Provider:   [name], MD   Other Instructions  Your physician recommends that you schedule a follow-up appointment in: 2 weeks for EKG    Thank you for choosing [facility]!

## 2019-07-26 ENCOUNTER — Ambulatory Visit: Payer: No Typology Code available for payment source | Admitting: Internal Medicine

## 2019-07-29 ENCOUNTER — Emergency Department (HOSPITAL_COMMUNITY)
Admission: EM | Admit: 2019-07-29 | Discharge: 2019-07-29 | Disposition: A | Discharge status: Home / Self Care | Payer: Self-pay | Attending: Emergency Medicine | Admitting: Emergency Medicine

## 2019-07-29 ENCOUNTER — Emergency Department (HOSPITAL_COMMUNITY): Payer: Self-pay

## 2019-07-29 ENCOUNTER — Other Ambulatory Visit: Payer: Self-pay

## 2019-07-29 ENCOUNTER — Encounter (HOSPITAL_COMMUNITY): Payer: Self-pay | Admitting: Emergency Medicine

## 2019-07-29 DIAGNOSIS — R079 Chest pain, unspecified: Secondary | ICD-10-CM

## 2019-07-29 DIAGNOSIS — R0789 Other chest pain: Secondary | ICD-10-CM | POA: Insufficient documentation

## 2019-07-29 DIAGNOSIS — Z79899 Other long term (current) drug therapy: Secondary | ICD-10-CM | POA: Insufficient documentation

## 2019-07-29 DIAGNOSIS — R002 Palpitations: Secondary | ICD-10-CM | POA: Insufficient documentation

## 2019-07-29 DIAGNOSIS — Z87891 Personal history of nicotine dependence: Secondary | ICD-10-CM | POA: Insufficient documentation

## 2019-07-29 DIAGNOSIS — Z975 Presence of (intrauterine) contraceptive device: Secondary | ICD-10-CM | POA: Insufficient documentation

## 2019-07-29 LAB — BASIC METABOLIC PANEL
Anion gap: 8 (ref 5–15)
BUN: 14 mg/dL (ref 6–20)
CO2: 23 mmol/L (ref 22–32)
Calcium: 9 mg/dL (ref 8.9–10.3)
Chloride: 105 mmol/L (ref 98–111)
Creatinine, Ser: 0.8 mg/dL (ref 0.44–1.00)
GFR calc Af Amer: >60 mL/min (ref >60)
GFR calc non Af Amer: >60 mL/min (ref >60)
Glucose, Bld: 104 mg/dL — ABNORMAL HIGH (ref 70–99)
Potassium: 4.1 mmol/L (ref 3.5–5.1)
Sodium: 136 mmol/L (ref 135–145)

## 2019-07-29 LAB — CBC
HCT: 40.4 % (ref 36.0–46.0)
Hemoglobin: 12.5 g/dL (ref 12.0–15.0)
MCH: 26.6 pg (ref 26.0–34.0)
MCHC: 30.9 g/dL (ref 30.0–36.0)
MCV: 86 fL (ref 80.0–100.0)
Platelets: 350 10*3/uL (ref 150–400)
RBC: 4.7 MIL/uL (ref 3.87–5.11)
RDW: 14.3 % (ref 11.5–15.5)
WBC: 6.9 10*3/uL (ref 4.0–10.5)
nRBC: 0 % (ref 0.0–0.2)

## 2019-07-29 LAB — TROPONIN I (HIGH SENSITIVITY): Troponin I (High Sensitivity): 7 ng/L (ref <18)

## 2019-07-29 MED ORDER — KETOROLAC TROMETHAMINE 30 MG/ML IJ SOLN
30.0000 mg | Freq: Once | INTRAMUSCULAR | Status: AC
  Administered 2019-07-29: 30 mg via INTRAVENOUS
  Filled 2019-07-29: qty 1

## 2019-07-29 NOTE — ED Triage Notes (Signed)
Pt c/o chest pain and shortness of breath that began about noon today. Denies N/V/D. Patient took cardizem 240 mg at 0800.

## 2019-07-29 NOTE — Discharge Instructions (Addendum)
Your EKG shows normal sinus rhythm.  Troponin was normal.  Follow-up with your cardiologist and/or primary care.

## 2019-07-30 LAB — TROPONIN I (HIGH SENSITIVITY): Troponin I (High Sensitivity): 5 ng/L (ref <18)

## 2019-07-30 NOTE — ED Provider Notes (Addendum)
[facility] EMERGENCY DEPARTMENT Provider Note   CSN: [id number] Arrival date & time: 07/29/19  2017     History Chief Complaint  Patient presents with  . Chest Pain    Jessica Sanchez is a 34 y.o. female.  Chief complaint chest pain and palpitations approximately noon today.  Previous history of SVT and elevated troponin.  No diaphoresis, dyspnea, nausea.  Past medical history includes obesity.  Severity is moderate.  Nothing makes symptoms better or worse.        Past Medical History:  Diagnosis Date  . Fibroid (bleeding) (uterine)   . Fibroid, uterine   . H/O metrorrhagia   . IUD (intrauterine device) in place 07/11/2015  . Menorrhagia   . Obesity, Class III, BMI 40-49.9 (morbid obesity) ([place]) 11/11/2018  . SVT (supraventricular tachycardia) [facility])     Patient Active Problem List   Diagnosis Date Noted  . Elevated troponin   . Attempted IUD removal, unsuccessful 07/05/2019  . Encounter for IUD insertion 07/05/2019  . Gastroesophageal reflux disease   . Nonspecific chest pain 11/11/2018  . Morbid obesity with body mass index (BMI) of 60.0 to 69.9 in adult ([place]) 11/11/2018  . Elevated d-dimer 11/11/2018  . SVT (supraventricular tachycardia) ([place]) 10/04/2018  . IUD (intrauterine device) in place 07/11/2015  . Other disorder of menstruation and other abnormal bleeding from female genital tract 10/30/2012    Past Surgical History:  Procedure Laterality Date  . HYSTEROSCOPY WITH D & C N/A 09/27/2013   Procedure: DILATATION AND CURETTAGE /HYSTEROSCOPY and insertion of Mirena IUD ;  Surgeon: [name]. Harrington Challenger, MD;  Location: [place] [facility];  Service: Gynecology;  Laterality: N/A;  . MYOMECTOMY  02/03/2011   Procedure: MYOMECTOMY;  Surgeon: [name], MD;  Location: [facility];  Service: Gynecology;  Laterality: N/A;  . SVT ABLATION N/A 11/08/2018   Procedure: SVT ABLATION;  Surgeon: [name], MD;  Location: [place] [facility];  Service: Cardiovascular;  Laterality: N/A;  .  TONSILLECTOMY       OB History    Gravida  1   Para      Term      Preterm      AB  1   Living        SAB  1   TAB      Ectopic      Multiple      Live Births              Family History  Problem Relation Age of Onset  . Cirrhosis Mother   . Diabetes Mother   . Cancer Maternal Grandfather   . Hypertension Sister   . Diabetes Sister   . Anesthesia problems Neg Hx   . Hypotension Neg Hx   . Malignant hyperthermia Neg Hx   . Pseudochol deficiency Neg Hx     Social History   Tobacco Use  . Smoking status: Former Research scientist (life sciences)  . Smokeless tobacco: Never Used  Substance Use Topics  . Alcohol use: Yes    Comment: occasional  . Drug use: No    Home Medications Prior to Admission medications   Medication Sig Start Date End Date Taking? Authorizing Provider  albuterol (PROVENTIL HFA) 108 (90 Base) MCG/ACT inhaler Inhale 1-2 puffs into the lungs every 6 (six) hours as needed for wheezing or shortness of breath.    Yes [provider]  diltiazem (CARDIZEM CD) 240 MG 24 hr capsule Take 1 capsule (240 mg total) by mouth  daily. 06/14/19 06/08/20 Yes Strader, Fransisco Hertz, PA-C  levonorgestrel (MIRENA) 20 MCG/24HR IUD 1 each by Intrauterine route once. EVERY 5 YEARS   Yes [provider]  Multiple Vitamins-Calcium (ONE-A-DAY WOMENS PO) Take 1 tablet by mouth daily.   Yes [provider]  omeprazole (PRILOSEC) 20 MG capsule Take 1 capsule (20 mg total) by mouth daily. Patient taking differently: Take 20 mg by mouth daily as needed (for GERD).  07/09/19  Yes Barton Dubois, MD  flecainide (TAMBOCOR) 50 MG tablet Take 1 tablet (50 mg total) by mouth 2 (two) times daily. Patient not taking: Reported on 07/29/2019 07/25/19   [name], MD  naproxen (NAPROSYN) 500 MG tablet Take 1 tablet (500 mg total) by mouth 2 (two) times daily as needed. Patient not taking: Reported on 07/29/2019 07/05/19   Cresenzo-Dishmon, Joaquim Lai, CNM  apixaban (ELIQUIS) 5 MG TABS  tablet Take 2 tablets (10mg ) twice daily for 7 days, then 1 tablet (5mg ) twice daily 04/20/19 04/20/19  Rodell Perna A, PA-C    Allergies    Shellfish allergy and Adhesive [tape]  Review of Systems   Review of Systems  All other systems reviewed and are negative.   Physical Exam Updated Vital Signs BP 126/67   Pulse 88   Temp 98.5 F (36.9 C) (Oral)   Resp (!) 21   Ht 5\' 1"  (1.549 m)   Wt (!) 145.2 kg   SpO2 97%   BMI 60.46 kg/m   Physical Exam Vitals and nursing note reviewed.  Constitutional:      Appearance: She is well-developed.     Comments: Elevated BMI.  HENT:     Head: Normocephalic and atraumatic.  Eyes:     Conjunctiva/sclera: Conjunctivae normal.  Cardiovascular:     Rate and Rhythm: Normal rate and regular rhythm.  Pulmonary:     Effort: Pulmonary effort is normal.     Breath sounds: Normal breath sounds.  Abdominal:     General: Bowel sounds are normal.     Palpations: Abdomen is soft.  Musculoskeletal:        General: Normal range of motion.     Cervical back: Neck supple.  Skin:    General: Skin is warm and dry.  Neurological:     General: No focal deficit present.     Mental Status: She is alert and oriented to person, place, and time.  Psychiatric:        Behavior: Behavior normal.     ED Results / Procedures / Treatments   Labs (all labs ordered are listed, but only abnormal results are displayed) Labs Reviewed  BASIC METABOLIC PANEL - Abnormal; Notable for the following components:      Result Value   Glucose, Bld 104 (*)    All other components within normal limits  CBC  TROPONIN I (HIGH SENSITIVITY)  TROPONIN I (HIGH SENSITIVITY)    EKG EKG Interpretation  Date/Time:  Sunday July 29 2019 21:48:09 EDT Ventricular Rate:  95 PR Interval:    QRS Duration: 82 QT Interval:  359 QTC Calculation: 452 R Axis:   26 Text Interpretation: Sinus rhythm Borderline T abnormalities, anterior leads Confirmed by Dory Horn) on  07/30/2019 8:44:10 AM   Radiology DG Chest Port 1 View  Result Date: 07/29/2019 CLINICAL DATA:  Chest pain and shortness of breath. EXAM: PORTABLE CHEST 1 VIEW COMPARISON:  July 08, 2019 FINDINGS: The heart size remains enlarged. There is no pneumothorax. No pleural effusion. No focal infiltrate. There  may be some mild atelectasis at the lung bases. No acute osseous abnormality detected. IMPRESSION: No active disease. Electronically Signed   By: Constance Holster M.D.   On: 07/29/2019 21:17    Procedures Procedures (including critical care time)  Medications Ordered in ED Medications  ketorolac (TORADOL) 30 MG/ML injection 30 mg (30 mg Intravenous Given 07/29/19 2200)    ED Course  I have reviewed the triage vital signs and the nursing notes.  Pertinent labs & imaging results that were available during my care of the patient were reviewed by me and considered in my medical decision making (see chart for details).  Clinical Course as of Jul 30 2131  Mon Jul 30, 2019  2132 Troponin I (High Sensitivity) [BC]    Clinical Course User Index [BC] Nat Christen, MD   MDM Rules/Calculators/A&P                      Patient is now in normal sinus rhythm.  Troponin negative.  Chest x-ray normal.  Patient observed for 2 hours.  Stable for outpatient management. Final Clinical Impression(s) / ED Diagnoses Final diagnoses:  Chest pain, unspecified type  Palpitations    Rx / DC Orders ED Discharge Orders    None       Nat Christen, MD 07/30/19 2132    Nat Christen, MD 07/30/19 2133

## 2019-08-02 ENCOUNTER — Ambulatory Visit: Payer: Medicaid Other | Admitting: Adult Health

## 2019-08-02 ENCOUNTER — Ambulatory Visit: Payer: No Typology Code available for payment source | Admitting: Women's Health

## 2019-08-10 ENCOUNTER — Emergency Department (HOSPITAL_COMMUNITY): Payer: Self-pay

## 2019-08-10 ENCOUNTER — Other Ambulatory Visit: Payer: Self-pay

## 2019-08-10 ENCOUNTER — Encounter (HOSPITAL_COMMUNITY): Payer: Self-pay

## 2019-08-10 ENCOUNTER — Emergency Department (HOSPITAL_COMMUNITY)
Admission: EM | Admit: 2019-08-10 | Discharge: 2019-08-11 | Disposition: A | Discharge status: Home / Self Care | Payer: Self-pay | Attending: Emergency Medicine | Admitting: Emergency Medicine

## 2019-08-10 DIAGNOSIS — R2241 Localized swelling, mass and lump, right lower limb: Secondary | ICD-10-CM | POA: Insufficient documentation

## 2019-08-10 DIAGNOSIS — Z79899 Other long term (current) drug therapy: Secondary | ICD-10-CM | POA: Insufficient documentation

## 2019-08-10 DIAGNOSIS — R079 Chest pain, unspecified: Secondary | ICD-10-CM | POA: Insufficient documentation

## 2019-08-10 DIAGNOSIS — Z87891 Personal history of nicotine dependence: Secondary | ICD-10-CM | POA: Insufficient documentation

## 2019-08-10 DIAGNOSIS — M7989 Other specified soft tissue disorders: Secondary | ICD-10-CM

## 2019-08-10 LAB — TROPONIN I (HIGH SENSITIVITY)
Troponin I (High Sensitivity): <2 ng/L (ref <18)
Troponin I (High Sensitivity): <2 ng/L (ref <18)

## 2019-08-10 LAB — CBC
HCT: 38.2 % (ref 36.0–46.0)
Hemoglobin: 11.5 g/dL — ABNORMAL LOW (ref 12.0–15.0)
MCH: 26.6 pg (ref 26.0–34.0)
MCHC: 30.1 g/dL (ref 30.0–36.0)
MCV: 88.2 fL (ref 80.0–100.0)
Platelets: 324 10*3/uL (ref 150–400)
RBC: 4.33 MIL/uL (ref 3.87–5.11)
RDW: 14.6 % (ref 11.5–15.5)
WBC: 6 10*3/uL (ref 4.0–10.5)
nRBC: 0 % (ref 0.0–0.2)

## 2019-08-10 LAB — BASIC METABOLIC PANEL
Anion gap: 9 (ref 5–15)
BUN: 11 mg/dL (ref 6–20)
CO2: 24 mmol/L (ref 22–32)
Calcium: 8.9 mg/dL (ref 8.9–10.3)
Chloride: 106 mmol/L (ref 98–111)
Creatinine, Ser: 0.79 mg/dL (ref 0.44–1.00)
GFR calc Af Amer: >60 mL/min (ref >60)
GFR calc non Af Amer: >60 mL/min (ref >60)
Glucose, Bld: 76 mg/dL (ref 70–99)
Potassium: 3.5 mmol/L (ref 3.5–5.1)
Sodium: 139 mmol/L (ref 135–145)

## 2019-08-10 LAB — I-STAT BETA HCG BLOOD, ED (MC, WL, AP ONLY): I-stat hCG, quantitative: <5 m[IU]/mL (ref <5)

## 2019-08-10 MED ORDER — SODIUM CHLORIDE 0.9% FLUSH
3.0000 mL | Freq: Once | INTRAVENOUS | Status: AC
  Administered 2019-08-10: 3 mL via INTRAVENOUS

## 2019-08-10 NOTE — ED Triage Notes (Signed)
Patient complains of CP that started last night and also concerned that she has developed some swelling in right foot and ankle, denies SOB. Patient alert and oriented. Reports pain worse with movement and ambulation

## 2019-08-10 NOTE — Discharge Instructions (Addendum)
Your work-up in the emergency department was reassuring, but we were unable to completely exclude the possibility of a blood clot in your right leg.  Return in the morning to have an ultrasound completed to assess for DVT in your right leg.  You were given a dose of a blood thinner prior to discharge.  This will wear off in 1 to 2 days.  If you are positive for a DVT on ultrasound, you will need to remain on a blood thinner for a minimum of 6 months.  Regardless of your ultrasound imaging, we recommend follow-up with your cardiologist for further evaluation of your ongoing chest pain.  Return to the ED for any new or concerning symptoms.

## 2019-08-10 NOTE — ED Notes (Signed)
ED Provider at bedside. 

## 2019-08-10 NOTE — ED Provider Notes (Signed)
[place] EMERGENCY DEPARTMENT Provider Note   CSN: [id number] Arrival date & time: 08/10/19  1715     History Chief Complaint  Patient presents with  . Chest Pain    Jessica Sanchez is a 34 y.o. female.  The history is provided by the patient.     34 year old female with a past medical history of fibroid uterus, menorrhagia, obesity, SVT presenting to the emerge department complaining of sternal nonradiating chest pain started last night with associated right foot and ankle swelling.  Patient has no shortness of breath.  Patient notes that the chest pain is worse with movement, exertion, deep breathing.  Patient notes that has been constant throughout today.  Nothing particularly improves the pain.  Patient denies any nausea or vomiting, diaphoresis.  Patient reports has had similar pain in the past.  Patient reports she is had increased frequency of her SVT.  She reports that she takes diltiazem 240 mg daily for her SVT and palpitations.  Patient notes having intermittent lightheadedness without palpitations patient reports she has had intermittent palpitations throughout the day.  In addition the patient reports she has developed swelling to her right foot and ankle with associated paresthesias that began 3 days ago.  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  Patient does not feel that the swelling significantly bruised overnight.  Patient denies any long trips, flights, car rides, recent surgeries, previous operations on the right lower extremity, history of cancer, hormone therapy, previous blood clots.  Past Medical History:  Diagnosis Date  . Fibroid (bleeding) (uterine)   . Fibroid, uterine   . H/O metrorrhagia   . IUD (intrauterine device) in place 07/11/2015  . Menorrhagia   . Obesity, Class III, BMI 40-49.9 (morbid obesity) ([place]) 11/11/2018  . SVT (supraventricular tachycardia) [facility])     Patient Active Problem  List   Diagnosis Date Noted  . Elevated troponin   . Attempted IUD removal, unsuccessful 07/05/2019  . Encounter for IUD insertion 07/05/2019  . Gastroesophageal reflux disease   . Nonspecific chest pain 11/11/2018  . Morbid obesity with body mass index (BMI) of 60.0 to 69.9 in adult ([place]) 11/11/2018  . Elevated d-dimer 11/11/2018  . SVT (supraventricular tachycardia) ([place]) 10/04/2018  . IUD (intrauterine device) in place 07/11/2015  . Other disorder of menstruation and other abnormal bleeding from female genital tract 10/30/2012    Past Surgical History:  Procedure Laterality Date  . HYSTEROSCOPY WITH D & C N/A 09/27/2013   Procedure: DILATATION AND CURETTAGE /HYSTEROSCOPY and insertion of Mirena IUD ;  Surgeon: [name]. Harrington Challenger, MD;  Location: [place] [facility];  Service: Gynecology;  Laterality: N/A;  . MYOMECTOMY  02/03/2011   Procedure: MYOMECTOMY;  Surgeon: [name], MD;  Location: [facility];  Service: Gynecology;  Laterality: N/A;  . SVT ABLATION N/A 11/08/2018   Procedure: SVT ABLATION;  Surgeon: [name], MD;  Location: [place] [facility];  Service: Cardiovascular;  Laterality: N/A;  . TONSILLECTOMY       OB History    Gravida  1   Para      Term      Preterm      AB  1   Living        SAB  1   TAB      Ectopic      Multiple      Live Births  Family History  Problem Relation Age of Onset  . Cirrhosis Mother   . Diabetes Mother   . Cancer Maternal Grandfather   . Hypertension Sister   . Diabetes Sister   . Anesthesia problems Neg Hx   . Hypotension Neg Hx   . Malignant hyperthermia Neg Hx   . Pseudochol deficiency Neg Hx     Social History   Tobacco Use  . Smoking status: Former Research scientist (life sciences)  . Smokeless tobacco: Never Used  Substance Use Topics  . Alcohol use: Yes    Comment: occasional  . Drug use: No    Home Medications Prior to Admission medications   Medication Sig Start Date End Date Taking? Authorizing Provider  albuterol  (PROVENTIL HFA) 108 (90 Base) MCG/ACT inhaler Inhale 1-2 puffs into the lungs every 6 (six) hours as needed for wheezing or shortness of breath.     [provider]  diltiazem (CARDIZEM CD) 240 MG 24 hr capsule Take 1 capsule (240 mg total) by mouth daily. 06/14/19 06/08/20  Erma Heritage, PA-C  flecainide (TAMBOCOR) 50 MG tablet Take 1 tablet (50 mg total) by mouth 2 (two) times daily. Patient not taking: Reported on 07/29/2019 07/25/19   [name], MD  levonorgestrel [facility]) 20 MCG/24HR IUD 1 each by Intrauterine route once. EVERY 5 YEARS    [provider]  Multiple Vitamins-Calcium (ONE-A-DAY WOMENS PO) Take 1 tablet by mouth daily.    [provider]  naproxen (NAPROSYN) 500 MG tablet Take 1 tablet (500 mg total) by mouth 2 (two) times daily as needed. Patient not taking: Reported on 07/29/2019 07/05/19   Cresenzo-Dishmon, Joaquim Lai, CNM  omeprazole (PRILOSEC) 20 MG capsule Take 1 capsule (20 mg total) by mouth daily. Patient taking differently: Take 20 mg by mouth daily as needed (for GERD).  07/09/19   Barton Dubois, MD  apixaban (ELIQUIS) 5 MG TABS tablet Take 2 tablets (10mg ) twice daily for 7 days, then 1 tablet (5mg ) twice daily 04/20/19 04/20/19  Rodell Perna A, PA-C    Allergies    Shellfish allergy and Adhesive [tape]  Review of Systems   Review of Systems  Constitutional: Negative for activity change, appetite change, chills, diaphoresis, fatigue and fever.  HENT: Negative for congestion and rhinorrhea.   Respiratory: Negative for cough, shortness of breath and wheezing.   Cardiovascular: Positive for chest pain, palpitations and leg swelling.  Gastrointestinal: Negative for abdominal distention, abdominal pain, constipation, diarrhea, nausea and vomiting.  Genitourinary: Negative for decreased urine volume, difficulty urinating, dysuria, frequency, menstrual problem, pelvic pain and urgency.  Musculoskeletal: Negative for arthralgias, back pain and  gait problem.  Skin: Negative for wound.  Neurological: Positive for light-headedness. Negative for dizziness, syncope, weakness and headaches.  All other systems reviewed and are negative.   Physical Exam Updated Vital Signs BP 111/72   Pulse 81   Temp 98.7 F (37.1 C) (Oral)   Resp (!) 26   Ht 5\' 1"  (1.549 m)   Wt (!) 145.2 kg   SpO2 100%   BMI 60.46 kg/m   Physical Exam Vitals and nursing note reviewed.  Constitutional:      General: She is not in acute distress.    Appearance: Normal appearance. She is normal weight. She is not ill-appearing or toxic-appearing.  HENT:     Head: Normocephalic.     Right Ear: External ear normal.     Left Ear: External ear normal.     Nose: Nose normal.  Mouth/Throat:     Mouth: Mucous membranes are moist.     Pharynx: Oropharynx is clear.  Eyes:     Extraocular Movements: Extraocular movements intact.  Cardiovascular:     Rate and Rhythm: Normal rate and regular rhythm.     Pulses: Normal pulses.     Heart sounds: Normal heart sounds.  Pulmonary:     Effort: Pulmonary effort is normal. No respiratory distress.     Breath sounds: Normal breath sounds. No wheezing or rhonchi.  Abdominal:     General: Bowel sounds are normal.     Palpations: Abdomen is soft.     Tenderness: There is no abdominal tenderness. There is no guarding.  Musculoskeletal:     Cervical back: Normal range of motion.     Right lower leg: Edema present.     Left lower leg: No edema.     Comments: 2+ pitting edema to the R foot and ankle. No tenderness to the calves  Skin:    General: Skin is warm and dry.     Capillary Refill: Capillary refill takes less than 2 seconds.  Neurological:     General: No focal deficit present.     Mental Status: She is alert and oriented to person, place, and time. Mental status is at baseline.  Psychiatric:        Mood and Affect: Mood normal.     ED Results / Procedures / Treatments   Labs (all labs ordered are  listed, but only abnormal results are displayed) Labs Reviewed  CBC - Abnormal; Notable for the following components:      Result Value   Hemoglobin 11.5 (*)    All other components within normal limits  BASIC METABOLIC PANEL  D-DIMER, QUANTITATIVE (NOT AT [facility])  I-STAT BETA HCG BLOOD, ED ([facility], [facility], [facility] ONLY)  TROPONIN I (HIGH SENSITIVITY)  TROPONIN I (HIGH SENSITIVITY)    EKG None  Radiology DG Chest 2 View  Result Date: 08/10/2019 CLINICAL DATA:  Chest pain EXAM: CHEST - 2 VIEW COMPARISON:  07/29/2019 FINDINGS: Heart is upper limits normal in size. No confluent opacities or effusions. No acute bony abnormality. IMPRESSION: No active cardiopulmonary disease. Electronically Signed   By: Rolm Baptise M.D.   On: 08/10/2019 18:42    Procedures Procedures (including critical care time)  Medications Ordered in ED Medications  sodium chloride flush (NS) 0.9 % injection 3 mL (3 mLs Intravenous Given 08/10/19 2320)    ED Course  I have reviewed the triage vital signs and the nursing notes.  Pertinent labs & imaging results that were available during my care of the patient were reviewed by me and considered in my medical decision making (see chart for details).    MDM Rules/Calculators/A&P                      34 year old female with a past medical history of fibroid uterus, menorrhagia, obesity, SVT presenting to the emerge department complaining of chest pain started last night with associated right foot and ankle swelling.  Patient has no shortness of breath.   Differential diagnoses considered include ACS, PE, PTX, dissection, pericarditis or myocarditis, less likely dissection, PNA, acute heart failure, or DVT  ECG interpreted by me demonstrated Normal sinus rhythm at 87 bpm, normal axis, normal intervals, no ST or T wave changes suggestive of acute ischemia, nonspecific T wave flattening in the inferior leads, overall similar EKG compared to previous on 07/29/2019  HEAR score 3.  Well score for DVT 1 placing the patient in moderate risk category  CXR interpreted by me demonstrated no acute cardiopulmonary processes  Labs demonstrated initial troponin less than 2 with a repeat troponin is also negative, no leukocytosis, normocytic anemia with hemoglobin 11.5 and MCV of 88.2, appears similar compared to previous most likely consistent with patient's history of fibroid uterus and menorrhagia, platelets 324, BMP unremarkable  Given the patient has a low here score with 2 troponins that were negative and overall reassuring exam, I feel the patient is appropriate for discharge with close follow-up in the outpatient setting.  Plan at the time of handoff is for D-dimer to evaluate for possible DVT given right lower extremity swelling and Well's score  Pt care was handed off to [employer] PA-C at 2345.  Complete history and physical and current plan have been communicated.  Please refer to their note for the remainder of ED care and ultimate disposition.  The plan for this patient was discussed with Dr. Regenia Skeeter, who voiced agreement and who oversaw evaluation and treatment of this patient.  Final Clinical Impression(s) / ED Diagnoses Final diagnoses:  Chest pain, unspecified type  Swelling of right lower extremity    Rx / DC Orders ED Discharge Orders    None       Filbert Berthold, MD 08/10/19 [id number]    Sherwood Gambler, MD 08/13/19 [phone]

## 2019-08-11 ENCOUNTER — Emergency Department (HOSPITAL_COMMUNITY): Payer: Self-pay

## 2019-08-11 ENCOUNTER — Ambulatory Visit (HOSPITAL_COMMUNITY): Admission: RE | Admit: 2019-08-11 | Payer: Self-pay | Source: Ambulatory Visit

## 2019-08-11 LAB — D-DIMER, QUANTITATIVE: D-Dimer, Quant: 0.9 ug/mL-FEU — ABNORMAL HIGH (ref 0.00–0.50)

## 2019-08-11 MED ORDER — IOHEXOL 350 MG/ML SOLN
100.0000 mL | Freq: Once | INTRAVENOUS | Status: AC | PRN
  Administered 2019-08-11: 100 mL via INTRAVENOUS

## 2019-08-11 MED ORDER — ENOXAPARIN SODIUM 150 MG/ML ~~LOC~~ SOLN
150.0000 mg | Freq: Once | SUBCUTANEOUS | Status: AC
  Administered 2019-08-11: 150 mg via SUBCUTANEOUS
  Filled 2019-08-11: qty 1

## 2019-08-11 NOTE — ED Provider Notes (Signed)
12:00 AM Jessica Sanchez care assumed at shift change from MD [facility].  In short, Jessica Sanchez is a 34 year old female with a history of fibroid uterus, menorrhagia, obesity, SVT presenting for complaints of sternal, nonradiating chest pain.  Also with associated right foot and ankle swelling.  Pending D-dimer at shift change.  D-dimer did return slightly elevated.  Given complaints of chest pain, will assess for PE with CTA.  If negative, Jessica Sanchez to be given dose of Lovenox and scheduled for venous duplex in the AM to r/o RLE DVT.  Jessica Sanchez verbalizes understanding of plan.  2:18 AM Jessica Sanchez with limited CTA of the chest, but without definitive evidence of acute pulmonary embolus.  Clinically, my suspicion for PE is low.  Heart rate has been in normal sinus rhythm while in the ED.  She has experienced no hypoxia.  Sats most recently 100% on room air.  We will proceed with plan for subcutaneous Lovenox and discharge to return in the morning.  Return precautions discussed and provided. Jessica Sanchez discharged in stable condition with no unaddressed concerns.   Results for orders placed or performed during the hospital encounter of [id number]  Basic metabolic panel  Result Value Ref Range   Sodium 139 135 - 145 mmol/L   Potassium 3.5 3.5 - 5.1 mmol/L   Chloride 106 98 - 111 mmol/L   CO2 24 22 - 32 mmol/L   Glucose, Bld 76 70 - 99 mg/dL   BUN 11 6 - 20 mg/dL   Creatinine, Ser 0.79 0.44 - 1.00 mg/dL   Calcium 8.9 8.9 - 10.3 mg/dL   GFR calc non Af Amer >60 >60 mL/min   GFR calc Af Amer >60 >60 mL/min   Anion gap 9 5 - 15  CBC  Result Value Ref Range   WBC 6.0 4.0 - 10.5 K/uL   RBC 4.33 3.87 - 5.11 MIL/uL   Hemoglobin 11.5 (L) 12.0 - 15.0 g/dL   HCT 38.2 36.0 - 46.0 %   MCV 88.2 80.0 - 100.0 fL   MCH 26.6 26.0 - 34.0 pg   MCHC 30.1 30.0 - 36.0 g/dL   RDW 14.6 11.5 - 15.5 %   Platelets 324 150 - 400 K/uL   nRBC 0.0 0.0 - 0.2 %  D-dimer, quantitative (not at [facility])  Result Value Ref Range   D-Dimer, Quant 0.90  (H) 0.00 - 0.50 ug/mL-FEU  I-Stat beta hCG blood, ED  Result Value Ref Range   I-stat hCG, quantitative <5.0 <5 mIU/mL   Comment 3          Troponin I (High Sensitivity)  Result Value Ref Range   Troponin I (High Sensitivity) <2 <18 ng/L  Troponin I (High Sensitivity)  Result Value Ref Range   Troponin I (High Sensitivity) <2 <18 ng/L   DG Chest 2 View  Result Date: 08/10/2019 CLINICAL DATA:  Chest pain EXAM: CHEST - 2 VIEW COMPARISON:  07/29/2019 FINDINGS: Heart is upper limits normal in size. No confluent opacities or effusions. No acute bony abnormality. IMPRESSION: No active cardiopulmonary disease. Electronically Signed   By: Rolm Baptise M.D.   On: 08/10/2019 18:42   CT Angio Chest PE W and/or Wo Contrast  Result Date: 08/11/2019 CLINICAL DATA:  Elevated D-dimer and swollen feet. EXAM: CT ANGIOGRAPHY CHEST WITH CONTRAST TECHNIQUE: Multidetector CT imaging of the chest was performed using the standard protocol during bolus administration of intravenous contrast. Multiplanar CT image reconstructions and MIPs were obtained to evaluate the vascular anatomy. CONTRAST:  166mL OMNIPAQUE IOHEXOL  350 MG/ML SOLN COMPARISON:  April 20, 2019 FINDINGS: Cardiovascular: A pulmonary arteries are markedly limited in evaluation secondary to suboptimal opacification with intravenous contrast. Extensive amount of artifact is also seen as a result of the Jessica Sanchez's body habitus. Normal heart size. No pericardial effusion. Mediastinum/Nodes: No enlarged mediastinal, hilar, or axillary lymph nodes. Thyroid gland, trachea, and esophagus demonstrate no significant findings. Lungs/Pleura: Lungs are clear. No pleural effusion or pneumothorax. Upper Abdomen: No acute abnormality. Musculoskeletal: No chest wall abnormality. No acute or significant osseous findings. Review of the MIP images confirms the above findings. IMPRESSION: 1. Markedly limited evaluation of the pulmonary arteries, as described above, without  definite evidence of acute pulmonary embolism. 2. No acute infiltrate. Electronically Signed   By: Virgina Norfolk M.D.   On: 08/11/2019 01:56   DG Chest Port 1 View  Result Date: 07/29/2019 CLINICAL DATA:  Chest pain and shortness of breath. EXAM: PORTABLE CHEST 1 VIEW COMPARISON:  July 08, 2019 FINDINGS: The heart size remains enlarged. There is no pneumothorax. No pleural effusion. No focal infiltrate. There may be some mild atelectasis at the lung bases. No acute osseous abnormality detected. IMPRESSION: No active disease. Electronically Signed   By: Constance Holster M.D.   On: 07/29/2019 21:17      Antonietta Breach, PA-C 08/11/19 [id number]    Ezequiel Essex, MD 08/11/19 [phone]

## 2019-08-11 NOTE — ED Notes (Signed)
Pt transported to CT ?

## 2019-08-11 NOTE — ED Notes (Signed)
Pt verbalizes understanding of d/c instructions. Prescriptions reviewed with patient. Pt taken to lobby via wheelchair at d/c with all belongings.

## 2019-08-15 ENCOUNTER — Ambulatory Visit (HOSPITAL_COMMUNITY)
Admission: RE | Admit: 2019-08-15 | Discharge: 2019-08-15 | Disposition: A | Discharge status: Home / Self Care | Payer: Self-pay | Source: Ambulatory Visit | Attending: Emergency Medicine | Admitting: Emergency Medicine

## 2019-08-15 ENCOUNTER — Other Ambulatory Visit: Payer: Self-pay

## 2019-08-15 DIAGNOSIS — M7989 Other specified soft tissue disorders: Secondary | ICD-10-CM | POA: Insufficient documentation

## 2019-08-15 NOTE — Progress Notes (Signed)
Right lower extremity venous duplex completed. Refer to "CV Proc" under chart review to view preliminary results.  08/15/2019 11:40 AM Kelby Aline., MHA, RVT, RDCS, RDMS

## 2019-08-21 ENCOUNTER — Encounter (HOSPITAL_COMMUNITY): Payer: Self-pay | Admitting: Emergency Medicine

## 2019-08-21 ENCOUNTER — Emergency Department (HOSPITAL_COMMUNITY)
Admission: EM | Admit: 2019-08-21 | Discharge: 2019-08-22 | Disposition: A | Discharge status: Home / Self Care | Payer: Self-pay | Attending: Emergency Medicine | Admitting: Emergency Medicine

## 2019-08-21 ENCOUNTER — Other Ambulatory Visit: Payer: Self-pay

## 2019-08-21 DIAGNOSIS — Z7901 Long term (current) use of anticoagulants: Secondary | ICD-10-CM | POA: Insufficient documentation

## 2019-08-21 DIAGNOSIS — R0789 Other chest pain: Secondary | ICD-10-CM | POA: Insufficient documentation

## 2019-08-21 DIAGNOSIS — Z87891 Personal history of nicotine dependence: Secondary | ICD-10-CM | POA: Insufficient documentation

## 2019-08-21 DIAGNOSIS — Z6841 Body Mass Index (BMI) 40.0 and over, adult: Secondary | ICD-10-CM | POA: Insufficient documentation

## 2019-08-21 DIAGNOSIS — I471 Supraventricular tachycardia: Secondary | ICD-10-CM | POA: Insufficient documentation

## 2019-08-21 DIAGNOSIS — Z79899 Other long term (current) drug therapy: Secondary | ICD-10-CM | POA: Insufficient documentation

## 2019-08-21 DIAGNOSIS — E669 Obesity, unspecified: Secondary | ICD-10-CM | POA: Insufficient documentation

## 2019-08-21 MED ORDER — SODIUM CHLORIDE 0.9% FLUSH: 3.0000 mL | Freq: Once | INTRAVENOUS | Status: DC

## 2019-08-21 NOTE — ED Triage Notes (Signed)
Pt c/o chest tightness, palpitation and SOB since 1930 today. HR on the 150 on triage.

## 2019-08-22 ENCOUNTER — Emergency Department (HOSPITAL_COMMUNITY): Payer: Self-pay

## 2019-08-22 LAB — CBC
HCT: 41.8 % (ref 36.0–46.0)
Hemoglobin: 12.8 g/dL (ref 12.0–15.0)
MCH: 26.4 pg (ref 26.0–34.0)
MCHC: 30.6 g/dL (ref 30.0–36.0)
MCV: 86.4 fL (ref 80.0–100.0)
Platelets: 330 10*3/uL (ref 150–400)
RBC: 4.84 MIL/uL (ref 3.87–5.11)
RDW: 14.6 % (ref 11.5–15.5)
WBC: 6.6 10*3/uL (ref 4.0–10.5)
nRBC: 0 % (ref 0.0–0.2)

## 2019-08-22 LAB — BASIC METABOLIC PANEL
Anion gap: 10 (ref 5–15)
BUN: 14 mg/dL (ref 6–20)
CO2: 21 mmol/L — ABNORMAL LOW (ref 22–32)
Calcium: 9.2 mg/dL (ref 8.9–10.3)
Chloride: 107 mmol/L (ref 98–111)
Creatinine, Ser: 0.8 mg/dL (ref 0.44–1.00)
GFR calc Af Amer: >60 mL/min (ref >60)
GFR calc non Af Amer: >60 mL/min (ref >60)
Glucose, Bld: 122 mg/dL — ABNORMAL HIGH (ref 70–99)
Potassium: 3.9 mmol/L (ref 3.5–5.1)
Sodium: 138 mmol/L (ref 135–145)

## 2019-08-22 LAB — I-STAT BETA HCG BLOOD, ED (MC, WL, AP ONLY): I-stat hCG, quantitative: <5 m[IU]/mL (ref <5)

## 2019-08-22 LAB — TROPONIN I (HIGH SENSITIVITY)
Troponin I (High Sensitivity): 3 ng/L (ref <18)
Troponin I (High Sensitivity): <2 ng/L (ref <18)

## 2019-08-22 MED ORDER — DILTIAZEM HCL 25 MG/5ML IV SOLN
10.0000 mg | Freq: Once | INTRAVENOUS | Status: AC
  Administered 2019-08-22: 10 mg via INTRAVENOUS
  Filled 2019-08-22: qty 5

## 2019-08-22 MED ORDER — DILTIAZEM HCL 25 MG/5ML IV SOLN
15.0000 mg | Freq: Once | INTRAVENOUS | Status: AC
  Administered 2019-08-22: 15 mg via INTRAVENOUS
  Filled 2019-08-22: qty 5

## 2019-08-22 MED ORDER — DILTIAZEM HCL ER COATED BEADS 240 MG PO CP24
240.0000 mg | ORAL_CAPSULE | Freq: Once | ORAL | Status: AC
  Administered 2019-08-22: 240 mg via ORAL
  Filled 2019-08-22 (×2): qty 1

## 2019-08-22 NOTE — ED Provider Notes (Signed)
[facility] EMERGENCY DEPARTMENT Provider Note   CSN: [id number] Arrival date & time: 08/21/19  2344     History Chief Complaint  Patient presents with  . Shortness of Breath  . Palpitations    Jessica Sanchez is a 34 y.o. female.  Patient presents to the emergency department for evaluation of palpitations and shortness of breath.  Patient reports that she has a history of recurrent SVT and this feels similar.  She did have an ablation performed last year but has had multiple episodes of SVT since the ablation.        Past Medical History:  Diagnosis Date  . Fibroid (bleeding) (uterine)   . Fibroid, uterine   . H/O metrorrhagia   . IUD (intrauterine device) in place 07/11/2015  . Menorrhagia   . Obesity, Class III, BMI 40-49.9 (morbid obesity) ([place]) 11/11/2018  . SVT (supraventricular tachycardia) [facility])     Patient Active Problem List   Diagnosis Date Noted  . Elevated troponin   . Attempted IUD removal, unsuccessful 07/05/2019  . Encounter for IUD insertion 07/05/2019  . Gastroesophageal reflux disease   . Nonspecific chest pain 11/11/2018  . Morbid obesity with body mass index (BMI) of 60.0 to 69.9 in adult ([place]) 11/11/2018  . Elevated d-dimer 11/11/2018  . SVT (supraventricular tachycardia) ([place]) 10/04/2018  . IUD (intrauterine device) in place 07/11/2015  . Other disorder of menstruation and other abnormal bleeding from female genital tract 10/30/2012    Past Surgical History:  Procedure Laterality Date  . HYSTEROSCOPY WITH D & C N/A 09/27/2013   Procedure: DILATATION AND CURETTAGE /HYSTEROSCOPY and insertion of Mirena IUD ;  Surgeon: [name]. Harrington Challenger, MD;  Location: [facility];  Service: Gynecology;  Laterality: N/A;  . MYOMECTOMY  02/03/2011   Procedure: MYOMECTOMY;  Surgeon: [name], MD;  Location: [facility];  Service: Gynecology;  Laterality: N/A;  . SVT ABLATION N/A 11/08/2018   Procedure: SVT ABLATION;  Surgeon: [name], MD;  Location:  [facility];  Service: Cardiovascular;  Laterality: N/A;  . TONSILLECTOMY       OB History    Gravida  1   Para      Term      Preterm      AB  1   Living        SAB  1   TAB      Ectopic      Multiple      Live Births              Family History  Problem Relation Age of Onset  . Cirrhosis Mother   . Diabetes Mother   . Cancer Maternal Grandfather   . Hypertension Sister   . Diabetes Sister   . Anesthesia problems Neg Hx   . Hypotension Neg Hx   . Malignant hyperthermia Neg Hx   . Pseudochol deficiency Neg Hx     Social History   Tobacco Use  . Smoking status: Former Research scientist (life sciences)  . Smokeless tobacco: Never Used  Substance Use Topics  . Alcohol use: Yes    Comment: occasional  . Drug use: No    Home Medications Prior to Admission medications   Medication Sig Start Date End Date Taking? Authorizing Provider  albuterol (PROVENTIL HFA) 108 (90 Base) MCG/ACT inhaler Inhale 1-2 puffs into the lungs every 6 (six) hours as needed for wheezing or shortness of breath.    Yes [provider]  diltiazem (  CARDIZEM CD) 240 MG 24 hr capsule Take 1 capsule (240 mg total) by mouth daily. 06/14/19 06/08/20 Yes Strader, Fransisco Hertz, PA-C  levonorgestrel (MIRENA) 20 MCG/24HR IUD 1 each by Intrauterine route once. EVERY 5 YEARS   Yes [provider]  Multiple Vitamins-Calcium (ONE-A-DAY WOMENS PO) Take 1 tablet by mouth daily.   Yes [provider]  omeprazole (PRILOSEC) 20 MG capsule Take 1 capsule (20 mg total) by mouth daily. Patient taking differently: Take 20 mg by mouth daily as needed (for GERD).  07/09/19  Yes Barton Dubois, MD  flecainide (TAMBOCOR) 50 MG tablet Take 1 tablet (50 mg total) by mouth 2 (two) times daily. Patient not taking: Reported on 07/29/2019 07/25/19   [name], MD  naproxen (NAPROSYN) 500 MG tablet Take 1 tablet (500 mg total) by mouth 2 (two) times daily as needed. Patient not taking: Reported on 07/29/2019  07/05/19   Cresenzo-Dishmon, Joaquim Lai, CNM  apixaban (ELIQUIS) 5 MG TABS tablet Take 2 tablets (10mg ) twice daily for 7 days, then 1 tablet (5mg ) twice daily 04/20/19 04/20/19  Rodell Perna A, PA-C    Allergies    Shellfish allergy and Adhesive [tape]  Review of Systems   Review of Systems  Respiratory: Positive for shortness of breath.   Cardiovascular: Positive for palpitations.  All other systems reviewed and are negative.   Physical Exam Updated Vital Signs BP (!) 104/53   Pulse 84   Temp 98.1 F (36.7 C) (Oral)   Resp 18   Ht 5\' 1"  (1.549 m)   Wt (!) 145.2 kg   SpO2 95%   BMI 60.48 kg/m   Physical Exam Vitals and nursing note reviewed.  Constitutional:      General: She is not in acute distress.    Appearance: Normal appearance. She is well-developed.  HENT:     Head: Normocephalic and atraumatic.     Right Ear: Hearing normal.     Left Ear: Hearing normal.     Nose: Nose normal.  Eyes:     Conjunctiva/sclera: Conjunctivae normal.     Pupils: Pupils are equal, round, and reactive to light.  Cardiovascular:     Rate and Rhythm: Regular rhythm. Tachycardia present.     Heart sounds: S1 normal and S2 normal. No murmur. No friction rub. No gallop.   Pulmonary:     Effort: Pulmonary effort is normal. No respiratory distress.     Breath sounds: Normal breath sounds.  Chest:     Chest wall: No tenderness.  Abdominal:     General: Bowel sounds are normal.     Palpations: Abdomen is soft.     Tenderness: There is no abdominal tenderness. There is no guarding or rebound. Negative signs include Murphy's sign and McBurney's sign.     Hernia: No hernia is present.  Musculoskeletal:        General: Normal range of motion.     Cervical back: Normal range of motion and neck supple.  Skin:    General: Skin is warm and dry.     Findings: No rash.  Neurological:     Mental Status: She is alert and oriented to person, place, and time.     GCS: GCS eye subscore is 4. GCS  verbal subscore is 5. GCS motor subscore is 6.     Cranial Nerves: No cranial nerve deficit.     Sensory: No sensory deficit.     Coordination: Coordination normal.  Psychiatric:  Speech: Speech normal.        Behavior: Behavior normal.        Thought Content: Thought content normal.     ED Results / Procedures / Treatments   Labs (all labs ordered are listed, but only abnormal results are displayed) Labs Reviewed  BASIC METABOLIC PANEL - Abnormal; Notable for the following components:      Result Value   CO2 21 (*)    Glucose, Bld 122 (*)    All other components within normal limits  CBC  I-STAT BETA HCG BLOOD, ED ([facility], [facility], [facility] ONLY)  TROPONIN I (HIGH SENSITIVITY)  TROPONIN I (HIGH SENSITIVITY)    EKG None  Radiology DG Chest Portable 1 View  Result Date: 08/22/2019 CLINICAL DATA:  Chest pain EXAM: PORTABLE CHEST 1 VIEW COMPARISON:  08/10/2019 FINDINGS: Heart is upper limits normal in size. Mild vascular congestion. No confluent opacities, effusions or edema. No acute bony abnormality. IMPRESSION: Mild vascular congestion. Electronically Signed   By: Rolm Baptise M.D.   On: 08/22/2019 00:32    Procedures Procedures (including critical care time)  Medications Ordered in ED Medications  sodium chloride flush (NS) 0.9 % injection 3 mL (has no administration in time range)  diltiazem (CARDIZEM) injection 15 mg (15 mg Intravenous Given 08/22/19 0236)  diltiazem (CARDIZEM) injection 10 mg (10 mg Intravenous Given 08/22/19 0418)  diltiazem (CARDIZEM CD) 24 hr capsule 240 mg (240 mg Oral Given 08/22/19 [id number])    ED Course  I have reviewed the triage vital signs and the nursing notes.  Pertinent labs & imaging results that were available during my care of the patient were reviewed by me and considered in my medical decision making (see chart for details).    MDM Rules/Calculators/A&P                      Patient presents to the emergency department for evaluation of  elevated heart rate.  Patient has a known history of SVT.  Patient underwent ablation last year but has had multiple episodes of somewhat refractory SVT since the ablation.  Reviewing her records reveals that she has failed adenosine in the past, has converted with Cardizem.    Initial EKG was consistent with SVT.  Patient was administered boluses of Cardizem with improvement.  Repeat EKG reveals sinus rhythm without any changes from prior EKG.  Her work-up has been unremarkable.  She is appropriate for discharge.  Continue Cardizem as prescribed by cardiology and follow-up with cardiology.  Final Clinical Impression(s) / ED Diagnoses Final diagnoses:  SVT (supraventricular tachycardia) ([place])    Rx / DC Orders ED Discharge Orders    None       Skylyn Slezak, Gwenyth Allegra, MD 08/22/19 [phone]

## 2019-08-22 NOTE — ED Notes (Signed)
Pt reports room is spinning, feels like heart "is beating funny.... I feel like passing out everytime I stand up."

## 2019-08-22 NOTE — ED Notes (Signed)
Patient verbalizes understanding of discharge instructions. Opportunity for questioning and answers were provided. Armband removed by staff, pt discharged from ED.  

## 2019-08-30 ENCOUNTER — Ambulatory Visit: Payer: Self-pay | Admitting: Internal Medicine

## 2019-08-31 ENCOUNTER — Telehealth: Payer: Self-pay

## 2019-08-31 MED ORDER — FLECAINIDE ACETATE 50 MG PO TABS: 75.0000 mg | ORAL_TABLET | Freq: Two times a day (BID) | ORAL | 3 refills | Status: DC

## 2019-08-31 NOTE — Telephone Encounter (Signed)
Left message advising to check mychart for new orders.

## 2019-08-31 NOTE — Telephone Encounter (Signed)
[name], MD sent to Damian Leavell, RN  She will need her flecainide increased to 75 mg bid and repeat ECG in 2 weeks. GT

## 2019-09-04 ENCOUNTER — Other Ambulatory Visit: Payer: Self-pay

## 2019-09-04 ENCOUNTER — Emergency Department (HOSPITAL_COMMUNITY): Payer: 59

## 2019-09-04 ENCOUNTER — Ambulatory Visit (INDEPENDENT_AMBULATORY_CARE_PROVIDER_SITE_OTHER): Payer: Self-pay | Admitting: Internal Medicine

## 2019-09-04 ENCOUNTER — Encounter (HOSPITAL_COMMUNITY): Payer: Self-pay | Admitting: Emergency Medicine

## 2019-09-04 ENCOUNTER — Other Ambulatory Visit: Payer: Self-pay | Admitting: Physician Assistant

## 2019-09-04 ENCOUNTER — Emergency Department (HOSPITAL_COMMUNITY)
Admission: EM | Admit: 2019-09-04 | Discharge: 2019-09-04 | Disposition: A | Discharge status: Home / Self Care | Payer: 59 | Attending: Emergency Medicine | Admitting: Emergency Medicine

## 2019-09-04 VITALS — HR 147 | Ht 61.0 in | Wt 324.0 lb

## 2019-09-04 DIAGNOSIS — Z79899 Other long term (current) drug therapy: Secondary | ICD-10-CM | POA: Diagnosis not present

## 2019-09-04 DIAGNOSIS — Z87891 Personal history of nicotine dependence: Secondary | ICD-10-CM | POA: Diagnosis not present

## 2019-09-04 DIAGNOSIS — I471 Supraventricular tachycardia: Secondary | ICD-10-CM

## 2019-09-04 DIAGNOSIS — I959 Hypotension, unspecified: Secondary | ICD-10-CM | POA: Diagnosis not present

## 2019-09-04 DIAGNOSIS — R9431 Abnormal electrocardiogram [ECG] [EKG]: Secondary | ICD-10-CM

## 2019-09-04 DIAGNOSIS — R000 Tachycardia, unspecified: Secondary | ICD-10-CM | POA: Diagnosis present

## 2019-09-04 LAB — BASIC METABOLIC PANEL
Anion gap: 14 (ref 5–15)
BUN: 11 mg/dL (ref 6–20)
CO2: 16 mmol/L — ABNORMAL LOW (ref 22–32)
Calcium: 9 mg/dL (ref 8.9–10.3)
Chloride: 105 mmol/L (ref 98–111)
Creatinine, Ser: 0.76 mg/dL (ref 0.44–1.00)
GFR calc Af Amer: >60 mL/min (ref >60)
GFR calc non Af Amer: >60 mL/min (ref >60)
Glucose, Bld: 94 mg/dL (ref 70–99)
Potassium: 4.4 mmol/L (ref 3.5–5.1)
Sodium: 135 mmol/L (ref 135–145)

## 2019-09-04 LAB — I-STAT BETA HCG BLOOD, ED (MC, WL, AP ONLY): I-stat hCG, quantitative: <5 m[IU]/mL (ref <5)

## 2019-09-04 LAB — CBC
HCT: 40.6 % (ref 36.0–46.0)
Hemoglobin: 13 g/dL (ref 12.0–15.0)
MCH: 27.2 pg (ref 26.0–34.0)
MCHC: 32 g/dL (ref 30.0–36.0)
MCV: 84.9 fL (ref 80.0–100.0)
Platelets: 296 10*3/uL (ref 150–400)
RBC: 4.78 MIL/uL (ref 3.87–5.11)
RDW: 14.4 % (ref 11.5–15.5)
WBC: 2.9 10*3/uL — ABNORMAL LOW (ref 4.0–10.5)
nRBC: 0 % (ref 0.0–0.2)

## 2019-09-04 MED ORDER — DILTIAZEM HCL-DEXTROSE 125-5 MG/125ML-% IV SOLN (PREMIX)
5.0000 mg/h | INTRAVENOUS | Status: DC
  Administered 2019-09-04: 5 mg/h via INTRAVENOUS
  Filled 2019-09-04: qty 125

## 2019-09-04 MED ORDER — ONDANSETRON HCL 4 MG/2ML IJ SOLN
4.0000 mg | Freq: Once | INTRAMUSCULAR | Status: AC
  Administered 2019-09-04: 14:00:00 4 mg via INTRAVENOUS
  Filled 2019-09-04: qty 2

## 2019-09-04 MED ORDER — ADENOSINE 6 MG/2ML IV SOLN
INTRAVENOUS | Status: AC
  Administered 2019-09-04: 12 mg
  Filled 2019-09-04: qty 2

## 2019-09-04 MED ORDER — SODIUM CHLORIDE 0.9 % IV BOLUS
500.0000 mL | Freq: Once | INTRAVENOUS | Status: AC
  Administered 2019-09-04: 500 mL via INTRAVENOUS

## 2019-09-04 MED ORDER — FLECAINIDE ACETATE 50 MG PO TABS: 50.0000 mg | ORAL_TABLET | Freq: Two times a day (BID) | ORAL | 3 refills | Status: DC

## 2019-09-04 MED ORDER — ADENOSINE 6 MG/2ML IV SOLN
INTRAVENOUS | Status: AC
  Administered 2019-09-04: 13:00:00 12 mg
  Filled 2019-09-04: qty 6

## 2019-09-04 MED ORDER — DILTIAZEM LOAD VIA INFUSION
10.0000 mg | Freq: Once | INTRAVENOUS | Status: AC
  Administered 2019-09-04: 10 mg via INTRAVENOUS
  Filled 2019-09-04: qty 10

## 2019-09-04 MED ORDER — FLECAINIDE ACETATE 50 MG PO TABS: 50.0000 mg | ORAL_TABLET | Freq: Two times a day (BID) | ORAL | 11 refills | Status: DC

## 2019-09-04 NOTE — Consult Note (Addendum)
Cardiology Consultation:   Patient ID: Jessica Sanchez MRN: [id number]; DOB: [birth year]  Admit date: 09/04/2019 Date of Consult: 09/04/2019  Primary Care Provider: Lucia Gaskins, MD Primary Cardiologist: Kate Sable, MD  Primary Electrophysiologist:  [name], MD    Patient Profile:   Jessica Sanchez is a 34 y.o. female with a hx of obesity and SVT who is being seen today for the evaluation of SVT at the request of Dr.  Alvino Chapel.  History of Present Illness:   Jessica Sanchez has had on/off palpitations for years with her known SVT.  She to date has never found any specific trigger.  She tries vagal maneuvers, these have never worked for her. She woke today feeling palpitations.  She has h/o EPS/Ablation last year 11/08/2018 CONCLUSIONS:  1. Sinus rhythm upon presentation.  2. The patient had dual AV nodal physiology with easily inducible classic AV nodal reentrant tachycardia, there were no other accessory pathways or arrhythmias induced  3. Successful radiofrequency modification of the slow AV nodal pathway  4. No inducible arrhythmias following ablation.  5. No early apparent complications.   Dr. Lovena Le noted During her procedure she had a large Koch's triangle and RF energy application would result in transient heart block limiting how much RF we could deliver  She saw Dr. Lovena Le today, she was in SVT.  Longer lasting episodes make her feel weak and lightheaded, SOB.  No CP  He recommended that she come to the ER for adenosine though she was reluctant, ultimately did come in. She was ion and SVT 153, SBP 80's-90's given adenosine with transient slowing though back into her SVT.  She was given IV diltiazem bolus with return of SR.  Her BP has improved 109/70 and she feels well.   Past Medical History:  Diagnosis Date  . Fibroid (bleeding) (uterine)   . Fibroid, uterine   . H/O metrorrhagia   . IUD (intrauterine device) in place 07/11/2015  . Menorrhagia   . Obesity,  Class III, BMI 40-49.9 (morbid obesity) ([place]) 11/11/2018  . SVT (supraventricular tachycardia) ([facility])     Past Surgical History:  Procedure Laterality Date  . HYSTEROSCOPY WITH D & C N/A 09/27/2013   Procedure: DILATATION AND CURETTAGE /HYSTEROSCOPY and insertion of Mirena IUD ;  Surgeon: [name]. Harrington Challenger, MD;  Location: [facility];  Service: Gynecology;  Laterality: N/A;  . MYOMECTOMY  02/03/2011   Procedure: MYOMECTOMY;  Surgeon: [name], MD;  Location: [facility];  Service: Gynecology;  Laterality: N/A;  . SVT ABLATION N/A 11/08/2018   Procedure: SVT ABLATION;  Surgeon: [name], MD;  Location: [facility];  Service: Cardiovascular;  Laterality: N/A;  . TONSILLECTOMY       Home Medications:  Prior to Admission medications   Medication Sig Start Date End Date Taking? Authorizing Provider  albuterol (PROVENTIL HFA) 108 (90 Base) MCG/ACT inhaler Inhale 1-2 puffs into the lungs every 6 (six) hours as needed for wheezing or shortness of breath.    Yes [provider]  diltiazem (CARDIZEM CD) 240 MG 24 hr capsule Take 1 capsule (240 mg total) by mouth daily. 06/14/19 06/08/20 Yes Strader, Fransisco Hertz, PA-C  levonorgestrel (MIRENA) 20 MCG/24HR IUD 1 each by Intrauterine route once. EVERY 5 YEARS   Yes [provider]  Multiple Vitamins-Calcium (ONE-A-DAY WOMENS PO) Take 1 tablet by mouth daily.   Yes [provider]  flecainide (TAMBOCOR) 50 MG tablet Take 1 tablet (50 mg total) by mouth 2 (two)  times daily. 09/04/19   [name], MD  apixaban (ELIQUIS) 5 MG TABS tablet Take 2 tablets (10mg ) twice daily for 7 days, then 1 tablet (5mg ) twice daily 04/20/19 04/20/19  Renita Papa, PA-C    Inpatient Medications: Scheduled Meds:  Continuous Infusions: . diltiazem (CARDIZEM) infusion Stopped (09/04/19 1356)   PRN Meds:   Allergies:    Allergies  Allergen Reactions  . Shellfish Allergy Shortness Of Breath, Itching and Swelling    Mouth became swollen,  throat started itching, and developed shortness of breath  . Adhesive [Tape] Itching    Depends on the type of medical tape    Social History:   Social History   Socioeconomic History  . Marital status: Single    Spouse name: Not on file  . Number of children: Not on file  . Years of education: Not on file  . Highest education level: Not on file  Occupational History  . Not on file  Tobacco Use  . Smoking status: Former Research scientist (life sciences)  . Smokeless tobacco: Never Used  Substance and Sexual Activity  . Alcohol use: Yes    Comment: occasional  . Drug use: No  . Sexual activity: Yes    Birth control/protection: I.U.D.  Other Topics Concern  . Not on file  Social History Narrative  . Not on file   Social Determinants of Health   Financial Resource Strain:   . Difficulty of Paying Living Expenses:   Food Insecurity:   . Worried About Charity fundraiser in the Last Year:   . Arboriculturist in the Last Year:   Transportation Needs:   . Film/video editor (Medical):   Marland Kitchen Lack of Transportation (Non-Medical):   Physical Activity:   . Days of Exercise per Week:   . Minutes of Exercise per Session:   Stress:   . Feeling of Stress :   Social Connections:   . Frequency of Communication with Friends and Family:   . Frequency of Social Gatherings with Friends and Family:   . Attends Religious Services:   . Active Member of Clubs or Organizations:   . Attends Archivist Meetings:   Marland Kitchen Marital Status:   Intimate Partner Violence:   . Fear of Current or Ex-Partner:   . Emotionally Abused:   Marland Kitchen Physically Abused:   . Sexually Abused:     Family History:   Family History  Problem Relation Age of Onset  . Cirrhosis Mother   . Diabetes Mother   . Cancer Maternal Grandfather   . Hypertension Sister   . Diabetes Sister   . Anesthesia problems Neg Hx   . Hypotension Neg Hx   . Malignant hyperthermia Neg Hx   . Pseudochol deficiency Neg Hx      ROS:  Please see the  history of present illness.  All other ROS reviewed and negative.     Physical Exam/Data:   Vitals:   09/04/19 1356 09/04/19 1400 09/04/19 1415 09/04/19 1432  BP:  108/77 109/70 99/63  Pulse: (!) 102 99 (!) 101 100  Resp: (!) 25 (!) 34 (!) 30 (!) 28  Temp:      SpO2: 98% 98% 98% 100%  Weight:      Height:        Intake/Output Summary (Last 24 hours) at 09/04/2019 1502 Last data filed at 09/04/2019 1348 Gross per 24 hour  Intake 500 ml  Output --  Net 500 ml   Last 3  Weights 09/04/2019 09/04/2019 08/21/2019  Weight (lbs) 325 lb 324 lb 320 lb 1.7 oz  Weight (kg) 147.419 kg 146.965 kg 145.2 kg     Body mass index is 59.44 kg/m.  General:  Well nourished, well developed, in no acute distress HEENT: normal Lymph: no adenopathy Neck: no JVD Endocrine:  No thryomegaly Vascular: No carotid bruits Cardiac:  RRR; no murmurs, gallops or rubs Lungs:  CTA b/l, no wheezing, rhonchi or rales  Abd: soft, nontender, obese  Ext: no edema Musculoskeletal:  No deformities Skin: warm and dry  Neuro:  CNs 2-12 intact, no focal abnormalities noted Psych:  Normal affect   EKG:  The EKG was personally reviewed and demonstrates:   SVT 152, looks short RP SR 104, no ischemic changes  Telemetry:  Telemetry was personally reviewed and demonstrates:   SR 80's  Relevant CV Studies:  11/12/18: TTE IMPRESSIONS  1. The left ventricle has normal systolic function with an ejection  fraction of 60-65%. The cavity size was normal. Left ventricular diastolic  parameters were normal. No evidence of left ventricular regional wall  motion abnormalities.  2. The right ventricle has normal systolic function. The cavity was  normal. There is no increase in right ventricular wall thickness. Right  ventricular systolic pressure could not be assessed.  3. There is no evidence of pericardial effusion.    Laboratory Data:  High Sensitivity Troponin:   Recent Labs  Lab 08/10/19 1814 08/10/19 2004  08/22/19 0016 08/22/19 0203  TROPONINIHS <2 <2 <2 3     Chemistry Recent Labs  Lab 09/04/19 1206  NA 135  K 4.4  CL 105  CO2 16*  GLUCOSE 94  BUN 11  CREATININE 0.76  CALCIUM 9.0  GFRNONAA >60  GFRAA >60  ANIONGAP 14    No results for input(s): PROT, ALBUMIN, AST, ALT, ALKPHOS, BILITOT in the last 168 hours. Hematology Recent Labs  Lab 09/04/19 1425  WBC 2.9*  RBC 4.78  HGB 13.0  HCT 40.6  MCV 84.9  MCH 27.2  MCHC 32.0  RDW 14.4  PLT 296   BNPNo results for input(s): BNP, PROBNP in the last 168 hours.  DDimer No results for input(s): DDIMER in the last 168 hours.   Radiology/Studies:  DG Chest Portable 1 View  Result Date: 09/04/2019 CLINICAL DATA:  Supraventricular tachycardia. Shortness of breath. EXAM: PORTABLE CHEST 1 VIEW COMPARISON:  08/22/2019 and prior exams FINDINGS: Mild cardiomegaly and pulmonary vascular congestion noted. There is no evidence of focal airspace disease, pulmonary edema, suspicious pulmonary nodule/mass, pleural effusion, or pneumothorax. No acute bony abnormalities are identified. IMPRESSION: Mild cardiomegaly with pulmonary vascular congestion. Electronically Signed   By: Margarette Canada M.D.   On: 09/04/2019 12:37   { Assessment and Plan:   1. SVT     She feels well, is back in SR, no CP, no SOB  She was previously without insurance and unable to get the flecainide.  She tells me that her insurance is now in effect and she should be able to get it filled. We discussed the flecainide is in addition to her diltiazem   She has EKG appt and Dr. Lovena Le follow up in place  Dr. Rayann Heman has seen the patient  OK to discharge from our standpoint     For questions or updates, please contact [facility] Please consult [email or web address] for contact info under     Signed, Baldwin Jamaica, PA-C  09/04/2019 3:02 PM    I have seen, examined  the patient, and reviewed the above assessment and plan.  Changes to above are made where necessary.   On exam, RRR.  Pt well known to Dr Lovena Le.  She has converted to sinus.  Ok to discharge with outpatient flecainide as per Dr [name] instructions.  Co Sign: Thompson Grayer, MD 09/04/2019

## 2019-09-04 NOTE — ED Notes (Signed)
Pt assisted to bedside commode

## 2019-09-04 NOTE — ED Notes (Addendum)
EDP made aware of BP, bolus ordered and started

## 2019-09-04 NOTE — ED Triage Notes (Signed)
Pt reports hx of SVT has had ablation in the past for the same, states that around 530 this morning she started feeling like her heart was racing, saw cardiologist who sent here over here after doing multiple maneuvers to attempt to slow HR. HR 152 in triage.

## 2019-09-04 NOTE — Patient Instructions (Addendum)
Medication Instructions:  Your physician recommends that you continue on your current medications as directed. Please refer to the Current Medication list given to you today.  START taking flecainide 50 mg one tablet by mouth twice a day  Labwork: None ordered.  Testing/Procedures: You will come to the [facility] office on Sep 13, 2019 for an EKG at 10:30 am  Follow-Up: Your physician wants you to follow-up in: 6 weeks with Dr. Lovena Le.    October 30, 2019 at 10:30 am at the [facility] office  Any Other Special Instructions Will Be Listed Below (If Applicable).  If you need a refill on your cardiac medications before your next appointment, please call your pharmacy.

## 2019-09-04 NOTE — ED Provider Notes (Addendum)
[facility] EMERGENCY DEPARTMENT Provider Note   CSN: [id number] Arrival date & time: 09/04/19  1147     History Chief Complaint  Patient presents with  . Tachycardia    Jessica Sanchez is a 34 y.o. female.  HPI Patient presents with SVT.  History of same.  Sent from electrophysiology office.  Has had recurrent episodes of this.  Sometimes convert with adenosine but often will take Cardizem.  On home Cardizem.  Supposed be on flecainide but cannot afford it due to insurance issues.  Patient came in from EP office after unable to convert there.  Reviewing notes stated that they wanted to come to the ER for adenosine but she did not want to come.  No fevers.  No cough.  Has had previous ablation.  States she feels crazy in a week when her heart goes fast like this.    Past Medical History:  Diagnosis Date  . Fibroid (bleeding) (uterine)   . Fibroid, uterine   . H/O metrorrhagia   . IUD (intrauterine device) in place 07/11/2015  . Menorrhagia   . Obesity, Class III, BMI 40-49.9 (morbid obesity) ([place]) 11/11/2018  . SVT (supraventricular tachycardia) [facility])     Patient Active Problem List   Diagnosis Date Noted  . Elevated troponin   . Attempted IUD removal, unsuccessful 07/05/2019  . Encounter for IUD insertion 07/05/2019  . Gastroesophageal reflux disease   . Nonspecific chest pain 11/11/2018  . Morbid obesity with body mass index (BMI) of 60.0 to 69.9 in adult ([place]) 11/11/2018  . Elevated d-dimer 11/11/2018  . SVT (supraventricular tachycardia) ([place]) 10/04/2018  . IUD (intrauterine device) in place 07/11/2015  . Other disorder of menstruation and other abnormal bleeding from female genital tract 10/30/2012    Past Surgical History:  Procedure Laterality Date  . HYSTEROSCOPY WITH D & C N/A 09/27/2013   Procedure: DILATATION AND CURETTAGE /HYSTEROSCOPY and insertion of Mirena IUD ;  Surgeon: [name]. Harrington Challenger, MD;  Location: [place] [facility];  Service: Gynecology;   Laterality: N/A;  . MYOMECTOMY  02/03/2011   Procedure: MYOMECTOMY;  Surgeon: [name], MD;  Location: [facility];  Service: Gynecology;  Laterality: N/A;  . SVT ABLATION N/A 11/08/2018   Procedure: SVT ABLATION;  Surgeon: [name], MD;  Location: [place] [facility];  Service: Cardiovascular;  Laterality: N/A;  . TONSILLECTOMY       OB History    Gravida  1   Para      Term      Preterm      AB  1   Living        SAB  1   TAB      Ectopic      Multiple      Live Births              Family History  Problem Relation Age of Onset  . Cirrhosis Mother   . Diabetes Mother   . Cancer Maternal Grandfather   . Hypertension Sister   . Diabetes Sister   . Anesthesia problems Neg Hx   . Hypotension Neg Hx   . Malignant hyperthermia Neg Hx   . Pseudochol deficiency Neg Hx     Social History   Tobacco Use  . Smoking status: Former Research scientist (life sciences)  . Smokeless tobacco: Never Used  Substance Use Topics  . Alcohol use: Yes    Comment: occasional  . Drug use: No    Home Medications Prior  to Admission medications   Medication Sig Start Date End Date Taking? Authorizing Provider  albuterol (PROVENTIL HFA) 108 (90 Base) MCG/ACT inhaler Inhale 1-2 puffs into the lungs every 6 (six) hours as needed for wheezing or shortness of breath.    Yes [provider]  diltiazem (CARDIZEM CD) 240 MG 24 hr capsule Take 1 capsule (240 mg total) by mouth daily. 06/14/19 06/08/20 Yes Strader, Fransisco Hertz, PA-C  levonorgestrel (MIRENA) 20 MCG/24HR IUD 1 each by Intrauterine route once. EVERY 5 YEARS   Yes [provider]  Multiple Vitamins-Calcium (ONE-A-DAY WOMENS PO) Take 1 tablet by mouth daily.   Yes [provider]  flecainide (TAMBOCOR) 50 MG tablet Take 1 tablet (50 mg total) by mouth 2 (two) times daily. 09/04/19   [name], MD  apixaban (ELIQUIS) 5 MG TABS tablet Take 2 tablets (10mg ) twice daily for 7 days, then 1 tablet (5mg ) twice daily 04/20/19  04/20/19  Rodell Perna A, PA-C    Allergies    Shellfish allergy and Adhesive [tape]  Review of Systems   Review of Systems  Constitutional: Negative for appetite change.  Respiratory: Positive for shortness of breath.   Cardiovascular: Positive for palpitations.  Genitourinary: Negative for flank pain.  Musculoskeletal: Negative for back pain.  Skin: Negative for rash.  Neurological: Positive for weakness.  Psychiatric/Behavioral: Negative for confusion.    Physical Exam Updated Vital Signs BP (!) 98/51   Pulse 100   Temp 98.9 F (37.2 C)   Resp (!) 32   Ht 5\' 2"  (1.575 m)   Wt (!) 147.4 kg   SpO2 96%   BMI 59.44 kg/m   Physical Exam Vitals and nursing note reviewed.  Constitutional:      Appearance: She is obese.  HENT:     Head: Normocephalic.  Cardiovascular:     Rate and Rhythm: Tachycardia present.  Pulmonary:     Breath sounds: No wheezing or rhonchi.  Abdominal:     Tenderness: There is no abdominal tenderness.  Musculoskeletal:     Right lower leg: No edema.     Left lower leg: No edema.  Skin:    General: Skin is warm.     Capillary Refill: Capillary refill takes less than 2 seconds.  Neurological:     Mental Status: She is alert and oriented to person, place, and time.     ED Results / Procedures / Treatments   Labs (all labs ordered are listed, but only abnormal results are displayed) Labs Reviewed  BASIC METABOLIC PANEL - Abnormal; Notable for the following components:      Result Value   CO2 16 (*)    All other components within normal limits  CBC - Abnormal; Notable for the following components:   WBC 2.9 (*)    All other components within normal limits  I-STAT BETA HCG BLOOD, ED ([facility], [facility], [facility] ONLY)    EKG EKG Interpretation  Date/Time:  Tuesday Sep 04 2019 11:51:23 EDT Ventricular Rate:  153 PR Interval:    QRS Duration: 82 QT Interval:  284 QTC Calculation: 453 R Axis:   22 Text Interpretation: Supraventricular tachycardia  Minimal voltage criteria for LVH, may be normal variant ( R in aVL ) Nonspecific T wave abnormality Abnormal ECG Confirmed by Davonna Belling [phone]) on 09/04/2019 12:07:51 PM   Radiology DG Chest Portable 1 View  Result Date: 09/04/2019 CLINICAL DATA:  Supraventricular tachycardia. Shortness of breath. EXAM: PORTABLE CHEST 1 VIEW COMPARISON:  08/22/2019 and prior  exams FINDINGS: Mild cardiomegaly and pulmonary vascular congestion noted. There is no evidence of focal airspace disease, pulmonary edema, suspicious pulmonary nodule/mass, pleural effusion, or pneumothorax. No acute bony abnormalities are identified. IMPRESSION: Mild cardiomegaly with pulmonary vascular congestion. Electronically Signed   By: Margarette Canada M.D.   On: 09/04/2019 12:37    Procedures Procedures (including critical care time)  Medications Ordered in ED Medications  diltiazem (CARDIZEM) 1 mg/mL load via infusion 10 mg (10 mg Intravenous Bolus from Bag 09/04/19 1347)    And  diltiazem (CARDIZEM) 125 mg in dextrose 5% 125 mL (1 mg/mL) infusion (0 mg/hr Intravenous Stopped 09/04/19 1356)  adenosine (ADENOCARD) 6 MG/2ML injection (12 mg  Given 09/04/19 1234)  adenosine (ADENOCARD) 6 MG/2ML injection (12 mg  Given 09/04/19 1231)  sodium chloride 0.9 % bolus 500 mL (0 mLs Intravenous Stopped 09/04/19 1348)  ondansetron (ZOFRAN) injection 4 mg (4 mg Intravenous Given 09/04/19 1426)    ED Course  I have reviewed the triage vital signs and the nursing notes.  Pertinent labs & imaging results that were available during my care of the patient were reviewed by me and considered in my medical decision making (see chart for details).    MDM Rules/Calculators/A&P                     Patient presented with shortness of breath and SVT.  History of same.  Seen at electrophysiologist and sent to the ER.  Adenosine given at 12 mg and then repeated at 12 mg dose without relief.  Did have some hypotension and given fluid bolus.  Also started on  Cardizem bolus then drip.  After the bolus patient converted back to sinus rhythm. Patient will be seen in the ER by electrophysiology.  Has been seen in the ER by EP and cleared for discharge. Final Clinical Impression(s) / ED Diagnoses Final diagnoses:  SVT (supraventricular tachycardia) [facility])    Rx / DC Orders ED Discharge Orders    None       Davonna Belling, MD 09/04/19 1447    Davonna Belling, MD 09/04/19 [phone]

## 2019-09-04 NOTE — Progress Notes (Signed)
HPI [name] returns today for followup. She is a pleasant 34 yo woman with SVT who underwent EP study and ablation over a year ago. She has had recurrent palpitations. During her procedure she had a large Koch's triangle and RF energy application would result in transient heart block limiting how much RF we could deliver. She has had recurrent SVT. I saw her several weeks ago and recommended a trial of flecainide but she had no insurance and did not get the prescription filled. In the interim she was in the ED with tachypalpitations. She awoke this morning with palpitations. She has been dizzy.  Allergies  Allergen Reactions  . Shellfish Allergy Shortness Of Breath, Itching and Swelling    Mouth became swollen, throat started itching, and developed shortness of breath  . Adhesive [Tape] Itching    Depends on the type of medical tape     Current Outpatient Medications  Medication Sig Dispense Refill  . albuterol (PROVENTIL HFA) 108 (90 Base) MCG/ACT inhaler Inhale 1-2 puffs into the lungs every 6 (six) hours as needed for wheezing or shortness of breath.     . diltiazem (CARDIZEM CD) 240 MG 24 hr capsule Take 1 capsule (240 mg total) by mouth daily. 90 capsule 3  . levonorgestrel (MIRENA) 20 MCG/24HR IUD 1 each by Intrauterine route once. EVERY 5 YEARS    . Multiple Vitamins-Calcium (ONE-A-DAY WOMENS PO) Take 1 tablet by mouth daily.    . flecainide (TAMBOCOR) 50 MG tablet Take 1 tablet (50 mg total) by mouth 2 (two) times daily. 180 tablet 3   No current facility-administered medications for this visit.     Past Medical History:  Diagnosis Date  . Fibroid (bleeding) (uterine)   . Fibroid, uterine   . H/O metrorrhagia   . IUD (intrauterine device) in place 07/11/2015  . Menorrhagia   . Obesity, Class III, BMI 40-49.9 (morbid obesity) ([place]) 11/11/2018  . SVT (supraventricular tachycardia) ([facility])     ROS:   All systems reviewed and negative except as noted in the HPI.    Past Surgical History:  Procedure Laterality Date  . HYSTEROSCOPY WITH D & C N/A 09/27/2013   Procedure: DILATATION AND CURETTAGE /HYSTEROSCOPY and insertion of Mirena IUD ;  Surgeon: [name]. Harrington Challenger, MD;  Location: [facility];  Service: Gynecology;  Laterality: N/A;  . MYOMECTOMY  02/03/2011   Procedure: MYOMECTOMY;  Surgeon: [name], MD;  Location: [facility];  Service: Gynecology;  Laterality: N/A;  . SVT ABLATION N/A 11/08/2018   Procedure: SVT ABLATION;  Surgeon: [name], MD;  Location: [place] [facility];  Service: Cardiovascular;  Laterality: N/A;  . TONSILLECTOMY       Family History  Problem Relation Age of Onset  . Cirrhosis Mother   . Diabetes Mother   . Cancer Maternal Grandfather   . Hypertension Sister   . Diabetes Sister   . Anesthesia problems Neg Hx   . Hypotension Neg Hx   . Malignant hyperthermia Neg Hx   . Pseudochol deficiency Neg Hx      Social History   Socioeconomic History  . Marital status: Single    Spouse name: Not on file  . Number of children: Not on file  . Years of education: Not on file  . Highest education level: Not on file  Occupational History  . Not on file  Tobacco Use  . Smoking status: Former Research scientist (life sciences)  . Smokeless tobacco: Never Used  Substance  and Sexual Activity  . Alcohol use: Yes    Comment: occasional  . Drug use: No  . Sexual activity: Yes    Birth control/protection: I.U.D.  Other Topics Concern  . Not on file  Social History Narrative  . Not on file   Social Determinants of Health   Financial Resource Strain:   . Difficulty of Paying Living Expenses:   Food Insecurity:   . Worried About Charity fundraiser in the Last Year:   . Arboriculturist in the Last Year:   Transportation Needs:   . Film/video editor (Medical):   Marland Kitchen Lack of Transportation (Non-Medical):   Physical Activity:   . Days of Exercise per Week:   . Minutes of Exercise per Session:   Stress:   . Feeling of Stress :   Social  Connections:   . Frequency of Communication with Friends and Family:   . Frequency of Social Gatherings with Friends and Family:   . Attends Religious Services:   . Active Member of Clubs or Organizations:   . Attends Archivist Meetings:   Marland Kitchen Marital Status:   Intimate Partner Violence:   . Fear of Current or Ex-Partner:   . Emotionally Abused:   Marland Kitchen Physically Abused:   . Sexually Abused:      Pulse (!) 147   Ht 5\' 1"  (1.549 m)   Wt (!) 324 lb (147 kg)   SpO2 95%   BMI 61.22 kg/m   Physical Exam:  Well appearing NAD HEENT: Unremarkable Neck:  No JVD, no thyromegally Lymphatics:  No adenopathy Back:  No CVA tenderness Lungs:  Clear with no wheezes HEART:  Regular rate rhythm, no murmurs, no rubs, no clicks Abd:  soft, positive bowel sounds, no organomegally, no rebound, no guarding Ext:  2 plus pulses, no edema, no cyanosis, no clubbing Skin:  No rashes no nodules Neuro:  CN II through XII intact, motor grossly intact  EKG - SVT at 147/min  Assess/Plan: 1. SVT - I have discussed the treatment options. I have offered her a visit to [facility] to get IV adenosine. She is not inclined to do this. I encouraged her to get her flecainide filled. She can also take an extra cardizem and if she cannot get the flecainide filled she will take the extra flecainide.  2. Obesity - once we get her heart racing controlled, she will need to start working on this.   Mikle Bosworth.D.

## 2019-09-13 ENCOUNTER — Ambulatory Visit: Payer: 59

## 2019-10-05 ENCOUNTER — Telehealth: Payer: Self-pay | Admitting: *Deleted

## 2019-10-05 ENCOUNTER — Other Ambulatory Visit (HOSPITAL_COMMUNITY)
Admission: RE | Admit: 2019-10-05 | Discharge: 2019-10-05 | Disposition: A | Discharge status: Home / Self Care | Payer: 59 | Source: Ambulatory Visit | Attending: Physician Assistant | Admitting: Physician Assistant

## 2019-10-05 DIAGNOSIS — Z20822 Contact with and (suspected) exposure to covid-19: Secondary | ICD-10-CM | POA: Diagnosis not present

## 2019-10-05 DIAGNOSIS — Z01812 Encounter for preprocedural laboratory examination: Secondary | ICD-10-CM | POA: Insufficient documentation

## 2019-10-05 LAB — SARS CORONAVIRUS 2 (TAT 6-24 HRS): SARS Coronavirus 2: NEGATIVE

## 2019-10-05 NOTE — Telephone Encounter (Signed)
Referral received from Short Stay RN for assistance with transportation. Was attempting to arrange [facility] Transportation but pt found a ride home. [place], [place] ED TOC CM [phone]

## 2019-10-09 ENCOUNTER — Other Ambulatory Visit: Payer: Self-pay

## 2019-10-09 ENCOUNTER — Ambulatory Visit (INDEPENDENT_AMBULATORY_CARE_PROVIDER_SITE_OTHER): Payer: 59

## 2019-10-09 DIAGNOSIS — I471 Supraventricular tachycardia: Secondary | ICD-10-CM | POA: Diagnosis not present

## 2019-10-09 DIAGNOSIS — Z79899 Other long term (current) drug therapy: Secondary | ICD-10-CM | POA: Diagnosis not present

## 2019-10-09 DIAGNOSIS — R9431 Abnormal electrocardiogram [ECG] [EKG]: Secondary | ICD-10-CM

## 2019-10-09 LAB — EXERCISE TOLERANCE TEST
Estimated workload: 4.6 METS
Exercise duration (min): 7 min
Exercise duration (sec): 0 s
MPHR: 186 {beats}/min
Peak HR: 146 {beats}/min
Percent HR: 78 %
RPE: 16
Rest HR: 93 {beats}/min

## 2019-10-30 ENCOUNTER — Encounter: Payer: Self-pay | Admitting: Internal Medicine

## 2019-10-30 ENCOUNTER — Ambulatory Visit (INDEPENDENT_AMBULATORY_CARE_PROVIDER_SITE_OTHER): Payer: 59 | Admitting: Internal Medicine

## 2019-10-30 ENCOUNTER — Other Ambulatory Visit: Payer: Self-pay

## 2019-10-30 VITALS — BP 124/70 | HR 93 | Ht 62.0 in | Wt 322.0 lb

## 2019-10-30 DIAGNOSIS — Z79899 Other long term (current) drug therapy: Secondary | ICD-10-CM

## 2019-10-30 DIAGNOSIS — R5383 Other fatigue: Secondary | ICD-10-CM

## 2019-10-30 DIAGNOSIS — I471 Supraventricular tachycardia: Secondary | ICD-10-CM | POA: Diagnosis not present

## 2019-10-30 MED ORDER — FLECAINIDE ACETATE 150 MG PO TABS
75.0000 mg | ORAL_TABLET | Freq: Two times a day (BID) | ORAL | 1 refills | Status: DC
Start: 2019-10-30 — End: 2020-11-13

## 2019-10-30 NOTE — Patient Instructions (Addendum)
Medication Instructions:  Your physician has recommended you make the following change in your medication:   1.  INcrease your flecainide---Flecainide 150 mg tablet- Take 1/2 tablet (75 mg) by mouth twice a day   Labwork: None ordered.  Testing/Procedures: None ordered.  Follow-Up: Your physician wants you to follow-up in: 3-4 months with Dr. Lovena Le.    February 12, 2020 at 9:45 am   Any Other Special Instructions Will Be Listed Below (If Applicable).  If you need a refill on your cardiac medications before your next appointment, please call your pharmacy.

## 2019-10-30 NOTE — Progress Notes (Addendum)
HPI Jessica Sanchez returns today for followup of SVT. She is a pleasant 34 yo woman with morbid obesity who underwent EP study and catheter ablation over a year ago. At that time she was found to have inducible SVT. He Koch's triangle was quite large and ablation was limited by VA block during junctional rhythm. She developed recurrent symptoms and SVT and has refused repeat ablation. She was in SVT in the office almost 2 months ago and I started her on low dose flecainide which has helped some but she still has noticed HR's in the 160 range. She has not had syncope. She does note problems with snoring/sleep apnea and is currently not on CPAP as the used machine is not working correctly. Allergies  Allergen Reactions  . Shellfish Allergy Shortness Of Breath, Itching and Swelling    Mouth became swollen, throat started itching, and developed shortness of breath  . Adhesive [Tape] Itching    Depends on the type of medical tape     Current Outpatient Medications  Medication Sig Dispense Refill  . albuterol (PROVENTIL HFA) 108 (90 Base) MCG/ACT inhaler Inhale 1-2 puffs into the lungs every 6 (six) hours as needed for wheezing or shortness of breath.     . diltiazem (CARDIZEM CD) 240 MG 24 hr capsule Take 1 capsule (240 mg total) by mouth daily. 90 capsule 3  . flecainide (TAMBOCOR) 50 MG tablet Take 1 tablet (50 mg total) by mouth 2 (two) times daily. 60 tablet 11  . levonorgestrel (MIRENA) 20 MCG/24HR IUD 1 each by Intrauterine route once. EVERY 5 YEARS    . Multiple Vitamins-Calcium (ONE-A-DAY WOMENS PO) Take 1 tablet by mouth daily.     No current facility-administered medications for this visit.     Past Medical History:  Diagnosis Date  . Fibroid (bleeding) (uterine)   . Fibroid, uterine   . H/O metrorrhagia   . IUD (intrauterine device) in place 07/11/2015  . Menorrhagia   . Obesity, Class III, BMI 40-49.9 (morbid obesity) ([place]) 11/11/2018  . SVT (supraventricular tachycardia)  ([facility])     ROS:   All systems reviewed and negative except as noted in the HPI.   Past Surgical History:  Procedure Laterality Date  . HYSTEROSCOPY WITH D & C N/A 09/27/2013   Procedure: DILATATION AND CURETTAGE /HYSTEROSCOPY and insertion of Mirena IUD ;  Surgeon: [name]. Harrington Challenger, MD;  Location: [facility] [facility];  Service: Gynecology;  Laterality: N/A;  . MYOMECTOMY  02/03/2011   Procedure: MYOMECTOMY;  Surgeon: [name], MD;  Location: [facility];  Service: Gynecology;  Laterality: N/A;  . SVT ABLATION N/A 11/08/2018   Procedure: SVT ABLATION;  Surgeon: [name], MD;  Location: [place] [facility];  Service: Cardiovascular;  Laterality: N/A;  . TONSILLECTOMY       Family History  Problem Relation Age of Onset  . Cirrhosis Mother   . Diabetes Mother   . Cancer Maternal Grandfather   . Hypertension Sister   . Diabetes Sister   . Anesthesia problems Neg Hx   . Hypotension Neg Hx   . Malignant hyperthermia Neg Hx   . Pseudochol deficiency Neg Hx      Social History   Socioeconomic History  . Marital status: Single    Spouse name: Not on file  . Number of children: Not on file  . Years of education: Not on file  . Highest education level: Not on file  Occupational History  . Not  on file  Tobacco Use  . Smoking status: Former Research scientist (life sciences)  . Smokeless tobacco: Never Used  Vaping Use  . Vaping Use: Never used  Substance and Sexual Activity  . Alcohol use: Yes    Comment: occasional  . Drug use: No  . Sexual activity: Yes    Birth control/protection: I.U.D.  Other Topics Concern  . Not on file  Social History Narrative  . Not on file   Social Determinants of Health   Financial Resource Strain:   . Difficulty of Paying Living Expenses:   Food Insecurity:   . Worried About Charity fundraiser in the Last Year:   . Arboriculturist in the Last Year:   Transportation Needs:   . Film/video editor (Medical):   Marland Kitchen Lack of Transportation (Non-Medical):   Physical Activity:    . Days of Exercise per Week:   . Minutes of Exercise per Session:   Stress:   . Feeling of Stress :   Social Connections:   . Frequency of Communication with Friends and Family:   . Frequency of Social Gatherings with Friends and Family:   . Attends Religious Services:   . Active Member of Clubs or Organizations:   . Attends Archivist Meetings:   Marland Kitchen Marital Status:   Intimate Partner Violence:   . Fear of Current or Ex-Partner:   . Emotionally Abused:   Marland Kitchen Physically Abused:   . Sexually Abused:      BP 124/70   Pulse 93   Ht 5\' 2"  (1.575 m)   Wt (!) 322 lb (146.1 kg)   SpO2 96%   BMI 58.89 kg/m   Physical Exam:  Morbidly obese Well appearing NAD HEENT: Unremarkable Neck:  No JVD, no thyromegally Lymphatics:  No adenopathy Back:  No CVA tenderness Lungs:  Clear with no wheezes HEART:  Regular rate rhythm, no murmurs, no rubs, no clicks Abd:  soft, positive bowel sounds, no organomegally, no rebound, no guarding Ext:  2 plus pulses, no edema, no cyanosis, no clubbing Skin:  No rashes no nodules Neuro:  CN II through XII intact, motor grossly intact  EKG - NSR   Assess/Plan: 1. SVT - I have discussed the treatment options and recommended she undergo uptitration of her flecainide to 75 mg bid. Additional rec's will follow. She will return in 2 weeks for an ECG. 2. Obesity - she needs to lose weight.  3. HTN - her sbp is minimally elevated.  4. Sleep apnea - she is encouraged to get a new cpap unit to treat her severe sleep apnea.  Mikle Bosworth.D.

## 2019-11-14 ENCOUNTER — Telehealth: Payer: Self-pay | Admitting: Internal Medicine

## 2019-11-14 NOTE — Telephone Encounter (Signed)
Patient c/o Palpitations:  High priority if patient c/o lightheadedness, shortness of breath, or chest pain  1) How long have you had palpitations/irregular HR/ Afib? Are you having the symptoms now? Yes  2) Are you currently experiencing lightheadedness, SOB or CP? Tightness in chest, lightheadedness  3) Do you have a history of afib (atrial fibrillation) or irregular heart rhythm? Yes  4) Have you checked your BP or HR? (document readings if available): 147/103; 160  5) Are you experiencing any other symptoms? Weird feeling

## 2019-11-14 NOTE — Telephone Encounter (Signed)
Call sent straight to triage. Patient complaining of chest tightness and elevated HR at 160. Patient stated at 4:00 am she woke up with elevated HR in the 160's. Patient took her flecainide and diltiazem, at this time HR 156. Patient stated her medications are not working. Advised patient to go to ED since she is also having chest tightness. Patient stated she would go to the ED if her HR did not come down soon. Patient stated she was going to get dressed and have a friend take her. Will forward message to Dr. Lovena Le for further advisement.

## 2019-11-16 NOTE — Telephone Encounter (Signed)
Returned call to pt.  Phone note had not been sent to this nurse.  Pt states her SVT quieted after 24 hours.  Her chest pressure has improved since her SVT has resolved, but still some chest pressure.  She is resting.  Advised if chest pressure increases with SOB she should consider going to ER.  Will have Pt come to office 7/26 for EKG d/t recent increase in flecainide.  Pt indicates understanding.

## 2019-11-22 ENCOUNTER — Telehealth: Payer: Self-pay | Admitting: *Deleted

## 2019-11-22 NOTE — Telephone Encounter (Signed)
From: Freada Bergeron, CMA Sent: 11/22/2019   9:30 AM EDT To: [name], MD, Damian Leavell, RN Subject: referral for sleep study                       Patient states she is having excessive daytime sleepiness and witnessed apneic events more than before and she is very concerned. She wants to have another sleep study. Patient says you referred her in the past.

## 2019-11-26 ENCOUNTER — Other Ambulatory Visit: Payer: Self-pay

## 2019-11-26 ENCOUNTER — Ambulatory Visit (INDEPENDENT_AMBULATORY_CARE_PROVIDER_SITE_OTHER): Payer: 59

## 2019-11-26 ENCOUNTER — Telehealth: Payer: Self-pay

## 2019-11-26 VITALS — HR 89 | Ht 62.0 in

## 2019-11-26 DIAGNOSIS — I471 Supraventricular tachycardia, unspecified: Secondary | ICD-10-CM

## 2019-11-26 NOTE — Telephone Encounter (Signed)
I will submit her paperwork to her new insurance as soon as I have everything.

## 2019-11-26 NOTE — Progress Notes (Addendum)
1.) Reason for visit: increased flecainide  2.) Name of MD requesting visit: Dr. Lovena Le  3.) H&P: Pt on flecainide for SVT.  Increased to 75 mg BID at last OV  4.) ROS related to problem: Pt states she has been doing better on the increased dose but is still having some breakthrough SVT episodes.  Pt also c/o increased sleep apnea s/s.  Pt with used/refurbished Cpap.  Will follow up.  5.) Assessment and plan per MD: Will discuss increasing flecainide with Dr. Lovena Le  Late entry 8/2/212-  Per Dr. Corliss Parish to increase flecainide to 100 mg BID with follow up EKG.   Discussed with Pt-she has pneumonia and is recovering and does not want to make med changes at this time.  Pt states she will send a mychart message to this nurse when she is ready to increase flecainide. Also advised ordering new CPAP.

## 2019-11-28 ENCOUNTER — Emergency Department (HOSPITAL_COMMUNITY): Payer: 59

## 2019-11-28 ENCOUNTER — Other Ambulatory Visit: Payer: Self-pay

## 2019-11-28 ENCOUNTER — Emergency Department (HOSPITAL_COMMUNITY)
Admission: EM | Admit: 2019-11-28 | Discharge: 2019-11-28 | Disposition: A | Discharge status: Home / Self Care | Payer: 59 | Attending: Emergency Medicine | Admitting: Emergency Medicine

## 2019-11-28 ENCOUNTER — Encounter (HOSPITAL_COMMUNITY): Payer: Self-pay | Admitting: Pediatrics

## 2019-11-28 DIAGNOSIS — Z20822 Contact with and (suspected) exposure to covid-19: Secondary | ICD-10-CM | POA: Insufficient documentation

## 2019-11-28 DIAGNOSIS — R079 Chest pain, unspecified: Secondary | ICD-10-CM | POA: Diagnosis present

## 2019-11-28 DIAGNOSIS — J189 Pneumonia, unspecified organism: Secondary | ICD-10-CM | POA: Diagnosis not present

## 2019-11-28 DIAGNOSIS — Z87891 Personal history of nicotine dependence: Secondary | ICD-10-CM | POA: Diagnosis not present

## 2019-11-28 LAB — BASIC METABOLIC PANEL
Anion gap: 11 (ref 5–15)
BUN: 7 mg/dL (ref 6–20)
CO2: 24 mmol/L (ref 22–32)
Calcium: 9.2 mg/dL (ref 8.9–10.3)
Chloride: 103 mmol/L (ref 98–111)
Creatinine, Ser: 0.74 mg/dL (ref 0.44–1.00)
GFR calc Af Amer: >60 mL/min (ref >60)
GFR calc non Af Amer: >60 mL/min (ref >60)
Glucose, Bld: 93 mg/dL (ref 70–99)
Potassium: 3.9 mmol/L (ref 3.5–5.1)
Sodium: 138 mmol/L (ref 135–145)

## 2019-11-28 LAB — CBC
HCT: 40.2 % (ref 36.0–46.0)
Hemoglobin: 12.4 g/dL (ref 12.0–15.0)
MCH: 26.2 pg (ref 26.0–34.0)
MCHC: 30.8 g/dL (ref 30.0–36.0)
MCV: 85 fL (ref 80.0–100.0)
Platelets: 310 10*3/uL (ref 150–400)
RBC: 4.73 MIL/uL (ref 3.87–5.11)
RDW: 15.2 % (ref 11.5–15.5)
WBC: 3.9 10*3/uL — ABNORMAL LOW (ref 4.0–10.5)
nRBC: 0 % (ref 0.0–0.2)

## 2019-11-28 LAB — TROPONIN I (HIGH SENSITIVITY)
Troponin I (High Sensitivity): <2 ng/L (ref <18)
Troponin I (High Sensitivity): <2 ng/L (ref <18)

## 2019-11-28 LAB — I-STAT BETA HCG BLOOD, ED (MC, WL, AP ONLY): I-stat hCG, quantitative: <5 m[IU]/mL (ref <5)

## 2019-11-28 LAB — SARS CORONAVIRUS 2 BY RT PCR (HOSPITAL ORDER, PERFORMED IN ~~LOC~~ HOSPITAL LAB): SARS Coronavirus 2: NEGATIVE

## 2019-11-28 MED ORDER — ALBUTEROL SULFATE HFA 108 (90 BASE) MCG/ACT IN AERS
4.0000 | INHALATION_SPRAY | Freq: Once | RESPIRATORY_TRACT | Status: AC
  Administered 2019-11-28: 4 via RESPIRATORY_TRACT
  Filled 2019-11-28: qty 6.7

## 2019-11-28 MED ORDER — SODIUM CHLORIDE 0.9 % IV SOLN
1.0000 g | Freq: Once | INTRAVENOUS | Status: AC
  Administered 2019-11-28: 1 g via INTRAVENOUS
  Filled 2019-11-28: qty 10

## 2019-11-28 MED ORDER — AMOXICILLIN 500 MG PO CAPS
1000.0000 mg | ORAL_CAPSULE | Freq: Two times a day (BID) | ORAL | 0 refills | Status: DC
Start: 2019-11-29 — End: 2020-02-13

## 2019-11-28 MED ORDER — DOXYCYCLINE HYCLATE 100 MG PO TABS
100.0000 mg | ORAL_TABLET | Freq: Once | ORAL | Status: AC
  Administered 2019-11-28: 100 mg via ORAL
  Filled 2019-11-28: qty 1

## 2019-11-28 MED ORDER — DOXYCYCLINE HYCLATE 100 MG PO CAPS
100.0000 mg | ORAL_CAPSULE | Freq: Two times a day (BID) | ORAL | 0 refills | Status: DC
Start: 2019-11-29 — End: 2020-02-05

## 2019-11-28 MED ORDER — SODIUM CHLORIDE 0.9% FLUSH
3.0000 mL | Freq: Once | INTRAVENOUS | Status: AC
  Administered 2019-11-28: 3 mL via INTRAVENOUS

## 2019-11-28 MED ORDER — AEROCHAMBER PLUS FLO-VU MEDIUM MISC
1.0000 | Freq: Once | Status: DC
  Filled 2019-11-28: qty 1

## 2019-11-28 MED ORDER — HYDROCODONE-ACETAMINOPHEN 5-325 MG PO TABS: 1.0000 | ORAL_TABLET | Freq: Four times a day (QID) | ORAL | 0 refills | Status: DC | PRN

## 2019-11-28 MED ORDER — AMOXICILLIN 500 MG PO CAPS
1000.0000 mg | ORAL_CAPSULE | Freq: Two times a day (BID) | ORAL | 0 refills | Status: DC
Start: 2019-11-28 — End: 2019-11-28

## 2019-11-28 MED ORDER — DOXYCYCLINE HYCLATE 100 MG PO CAPS
100.0000 mg | ORAL_CAPSULE | Freq: Two times a day (BID) | ORAL | 0 refills | Status: DC
Start: 2019-11-28 — End: 2019-11-28

## 2019-11-28 MED ORDER — HYDROCODONE-ACETAMINOPHEN 5-325 MG PO TABS
1.0000 | ORAL_TABLET | Freq: Once | ORAL | Status: AC
  Administered 2019-11-28: 1 via ORAL
  Filled 2019-11-28: qty 1

## 2019-11-28 NOTE — ED Provider Notes (Signed)
[facility] EMERGENCY DEPARTMENT Provider Note   CSN: [id number] Arrival date & time: 11/28/19  1119     History Chief Complaint  Patient presents with  . Chest Pain  . Cough    Jessica Sanchez is a 34 y.o. female.  HPI Patient reports about 3 or 4 days ago she started to feel a sore throat and some burning and aching in her chest.  Patient reports she then started to get some relief harsh coughing.  She reports it feels like she needs to bring up mucus but nothing will come out.  She has got a sharp and burning quality pain with cough.  She reports she does feel short of breath if she tries to do activities.  She does not have a thermometer but is fairly certain that she had a fever last night.  She reports she was very hot to the touch and aching.  No nausea and vomiting.  No abdominal pain.  No lower extremity swelling or calf pain.  Patient reports due to standing she chronically has some swelling around the ankles but no change from normal for her.    Past Medical History:  Diagnosis Date  . Fibroid (bleeding) (uterine)   . Fibroid, uterine   . H/O metrorrhagia   . IUD (intrauterine device) in place 07/11/2015  . Menorrhagia   . Obesity, Class III, BMI 40-49.9 (morbid obesity) ([place]) 11/11/2018  . SVT (supraventricular tachycardia) [facility])     Patient Active Problem List   Diagnosis Date Noted  . Elevated troponin   . Attempted IUD removal, unsuccessful 07/05/2019  . Encounter for IUD insertion 07/05/2019  . Gastroesophageal reflux disease   . Nonspecific chest pain 11/11/2018  . Morbid obesity with body mass index (BMI) of 60.0 to 69.9 in adult ([place]) 11/11/2018  . Elevated d-dimer 11/11/2018  . SVT (supraventricular tachycardia) ([place]) 10/04/2018  . IUD (intrauterine device) in place 07/11/2015  . Other disorder of menstruation and other abnormal bleeding from female genital tract 10/30/2012    Past Surgical History:  Procedure Laterality Date  .  HYSTEROSCOPY WITH D & C N/A 09/27/2013   Procedure: DILATATION AND CURETTAGE /HYSTEROSCOPY and insertion of Mirena IUD ;  Surgeon: [name]. Harrington Challenger, MD;  Location: [facility];  Service: Gynecology;  Laterality: N/A;  . MYOMECTOMY  02/03/2011   Procedure: MYOMECTOMY;  Surgeon: [name], MD;  Location: [facility];  Service: Gynecology;  Laterality: N/A;  . SVT ABLATION N/A 11/08/2018   Procedure: SVT ABLATION;  Surgeon: [name], MD;  Location: [facility];  Service: Cardiovascular;  Laterality: N/A;  . TONSILLECTOMY       OB History    Gravida  1   Para      Term      Preterm      AB  1   Living        SAB  1   TAB      Ectopic      Multiple      Live Births              Family History  Problem Relation Age of Onset  . Cirrhosis Mother   . Diabetes Mother   . Cancer Maternal Grandfather   . Hypertension Sister   . Diabetes Sister   . Anesthesia problems Neg Hx   . Hypotension Neg Hx   . Malignant hyperthermia Neg Hx   . Pseudochol deficiency Neg Hx  Social History   Tobacco Use  . Smoking status: Former Research scientist (life sciences)  . Smokeless tobacco: Never Used  Vaping Use  . Vaping Use: Never used  Substance Use Topics  . Alcohol use: Yes    Comment: occasional  . Drug use: No    Home Medications Prior to Admission medications   Medication Sig Start Date End Date Taking? Authorizing Provider  albuterol (PROVENTIL HFA) 108 (90 Base) MCG/ACT inhaler Inhale 1-2 puffs into the lungs every 6 (six) hours as needed for wheezing or shortness of breath.    Yes [provider]  diltiazem (CARDIZEM CD) 240 MG 24 hr capsule Take 1 capsule (240 mg total) by mouth daily. 06/14/19 06/08/20 Yes Strader, Fransisco Hertz, PA-C  flecainide (TAMBOCOR) 150 MG tablet Take 0.5 tablets (75 mg total) by mouth 2 (two) times daily. 10/30/19  Yes [name], MD  levonorgestrel (MIRENA) 20 MCG/24HR IUD 1 each by Intrauterine route once. EVERY 5 YEARS   Yes [provider]    Multiple Vitamins-Calcium (ONE-A-DAY WOMENS PO) Take 1 tablet by mouth daily.   Yes [provider]  amoxicillin (AMOXIL) 500 MG capsule Take 2 capsules (1,000 mg total) by mouth 2 (two) times daily. 11/29/19   Charlesetta Shanks, MD  doxycycline (VIBRAMYCIN) 100 MG capsule Take 1 capsule (100 mg total) by mouth 2 (two) times daily. One po bid x 7 days 11/29/19   Charlesetta Shanks, MD  HYDROcodone-acetaminophen (NORCO/VICODIN) 5-325 MG tablet Take 1-2 tablets by mouth every 6 (six) hours as needed for moderate pain or severe pain. 11/28/19   Charlesetta Shanks, MD  apixaban (ELIQUIS) 5 MG TABS tablet Take 2 tablets (10mg ) twice daily for 7 days, then 1 tablet (5mg ) twice daily 04/20/19 04/20/19  Rodell Perna A, PA-C    Allergies    Shellfish allergy and Adhesive [tape]  Review of Systems   Review of Systems 10 systems reviewed and negative except as per HPI Physical Exam Updated Vital Signs BP (!) 126/51   Pulse 89   Temp 98.4 F (36.9 C) (Oral)   Resp 15   Ht 5\' 2"  (1.575 m)   Wt (!) 147 kg   LMP  (LMP Unknown)   SpO2 97%   BMI 59.26 kg/m   Physical Exam Constitutional:      Comments: Alert and nontoxic.  Mild increased work of breathing.  Paroxysmal harsh cough.  HENT:     Head: Normocephalic and atraumatic.     Mouth/Throat:     Mouth: Mucous membranes are moist.     Pharynx: Oropharynx is clear.  Eyes:     Extraocular Movements: Extraocular movements intact.  Cardiovascular:     Heart sounds: Normal heart sounds.     Comments: Borderline tachycardia.  No rub murmur gallop. Pulmonary:     Comments: Harsh paroxysmal cough.  Mild tachypnea.  Wheezing in mid right lung field. Abdominal:     General: There is no distension.     Palpations: Abdomen is soft.     Tenderness: There is no abdominal tenderness. There is no guarding.  Musculoskeletal:        General: No swelling or tenderness. Normal range of motion.     Right lower leg: No edema.     Left lower leg: No edema.   Skin:    General: Skin is warm and dry.  Neurological:     General: No focal deficit present.     Mental Status: She is oriented to person, place, and time.  Coordination: Coordination normal.  Psychiatric:        Mood and Affect: Mood normal.     ED Results / Procedures / Treatments   Labs (all labs ordered are listed, but only abnormal results are displayed) Labs Reviewed  CBC - Abnormal; Notable for the following components:      Result Value   WBC 3.9 (*)    All other components within normal limits  SARS CORONAVIRUS 2 BY RT PCR (HOSPITAL ORDER, [facility])  BASIC METABOLIC PANEL  I-STAT BETA HCG BLOOD, ED ([facility], [facility], [facility] ONLY)  TROPONIN I (HIGH SENSITIVITY)  TROPONIN I (HIGH SENSITIVITY)    EKG EKG Interpretation  Date/Time:  Wednesday November 28 2019 11:36:12 EDT Ventricular Rate:  97 PR Interval:  136 QRS Duration: 80 QT Interval:  342 QTC Calculation: 434 R Axis:   4 Text Interpretation: Normal sinus rhythm Cannot rule out Anterior infarct , age undetermined Abnormal ECG no sig change from previous Confirmed by Charlesetta Shanks [phone]) on 11/28/2019 8:28:33 PM   Radiology DG Chest 2 View  Result Date: 11/28/2019 CLINICAL DATA:  Chest pain. Additional provided: Patient reports chest pain and cough a possible fever last night. EXAM: CHEST - 2 VIEW COMPARISON:  Prior chest radiographs 09/04/2019 and earlier. FINDINGS: Heart size at the upper limits of normal, unchanged. Apparent subtle airspace opacity within the right mid lung field. No evidence of pleural effusion or pneumothorax. No acute bony abnormality identified. IMPRESSION: Subtle airspace opacity within the right mid lung field which is nonspecific, but may reflect pneumonia given the provided history. Consider short interval radiographic follow-up. Electronically Signed   By: Kellie Simmering DO   On: 11/28/2019 12:02    Procedures Procedures (including critical care time)  Medications  Ordered in ED Medications  AeroChamber Plus Flo-Vu Medium MISC 1 each (has no administration in time range)  doxycycline (VIBRA-TABS) tablet 100 mg (has no administration in time range)  sodium chloride flush (NS) 0.9 % injection 3 mL (3 mLs Intravenous Given 11/28/19 1813)  HYDROcodone-acetaminophen (NORCO/VICODIN) 5-325 MG per tablet 1 tablet (1 tablet Oral Given 11/28/19 1802)  albuterol (VENTOLIN HFA) 108 (90 Base) MCG/ACT inhaler 4 puff (4 puffs Inhalation Given 11/28/19 1801)  cefTRIAXone (ROCEPHIN) 1 g in sodium chloride 0.9 % 100 mL IVPB (0 g Intravenous Stopped 11/28/19 1917)    ED Course  I have reviewed the triage vital signs and the nursing notes.  Pertinent labs & imaging results that were available during my care of the patient were reviewed by me and considered in my medical decision making (see chart for details).    MDM Rules/Calculators/A&P                         Patient presents as outlined above.  She has developed cough with pleuritic chest pain, subjective fever and sore throat.  Patient is nontoxic.  She does not have significant respiratory distress.  There is focal wheezing on the right side.  Chest x-ray shows focal pneumonia.  Patient has been given 1 dose of Rocephin in the emergency department.  Albuterol inhaler with spacer dispensed with teaching and instruction.  At this time, patient stable for outpatient treatment for community-acquired pneumonia.  Patient has history of SVT and is compliant with cardiac medications per her report.  Heart rate is controlled.  No signs of dysrhythmia.  Patient does not have ischemic quality chest pain.  Chest pain is very pleuritic with  cough.  2 sets of troponins normal.  At this time, patient stable for outpatient treatment of community-acquired pneumonia.  Return precautions reviewed. Final Clinical Impression(s) / ED Diagnoses Final diagnoses:  Community acquired pneumonia of right middle lobe of lung    Rx / DC Orders ED  Discharge Orders         Ordered    amoxicillin (AMOXIL) 500 MG capsule  2 times daily     Discontinue  Reprint     11/28/19 2023    doxycycline (VIBRAMYCIN) 100 MG capsule  2 times daily     Discontinue  Reprint     11/28/19 2023    doxycycline (VIBRAMYCIN) 100 MG capsule  2 times daily,   Status:  Discontinued     Reprint     11/28/19 2020    HYDROcodone-acetaminophen (NORCO/VICODIN) 5-325 MG tablet  Every 6 hours PRN     Discontinue  Reprint     11/28/19 2020    amoxicillin (AMOXIL) 500 MG capsule  2 times daily,   Status:  Discontinued     Reprint     11/28/19 2020           Charlesetta Shanks, MD 11/28/19 2031

## 2019-11-28 NOTE — ED Triage Notes (Signed)
Co chest pain and cough, maybe fever last night. Stated hurts to breath

## 2019-11-28 NOTE — Discharge Instructions (Addendum)
1.  You have been given a dose of Rocephin and doxycycline in the emergency department.  Start your doxycycline tomorrow morning and start taking twice daily.  Start your prescribed amoxicillin tomorrow evening and take twice daily. 2.  Use the albuterol inhaler provided.  Take 2 puffs every 2-4 hours as needed for wheezing and cough.  You may take 1-2 hydrocodone tablets for cough or chest pain. 3.  Follow instructions for community-acquired pneumonia.  Return to the emergency department if you have worsening or changing symptoms.  Follow-up with your doctor in the next 3 to 5 days.

## 2019-11-29 ENCOUNTER — Telehealth: Payer: Self-pay | Admitting: Surgery

## 2019-11-29 ENCOUNTER — Ambulatory Visit: Payer: Self-pay | Admitting: *Deleted

## 2019-11-29 NOTE — Telephone Encounter (Signed)
Patient diagnosed with pneumonia yesterday at the ED. Prescribed abx, nebs and pain medication. Had coughing fits thru the night. This morning had some bloody streaks with cough/phlegm. Using nebulizer now, has not picked up medications yet. Denies fever. Reviewed Care Advice as marked. Continue cough medication along with the prescribed medications. Call PCP for appointment if worsens or no improvement after starting medications.  Reason for Disposition . [1] More than one episode of coughing up blood AND [2] < 1 tablespoon (15 ml) of pure red blood  Answer Assessment - Initial Assessment Questions 1. ONSET: "When did the cough begin?"      During the night with coughing fits. 2. SEVERITY: "How bad is the cough today?" "Did the blood appear after a coughing spell?"      Yes 3. SPUTUM: "Describe the color of your sputum" (none, dry cough; clear, white, yellow, green)     yellow 4. HEMOPTYSIS: "How much blood?" (flecks, streaks, tablespoons, etc.)     streaks 5. DIFFICULTY BREATHING: "Are you having difficulty breathing?" If Yes, ask: "How bad is it?" (e.g., mild, moderate, severe)    - MILD: No SOB at rest, mild SOB with walking, speaks normally in sentences, can lay down, no retractions, pulse < 100.    - MODERATE: SOB at rest, SOB with minimal exertion and prefers to sit, cannot lie down flat, speaks in phrases, mild retractions, audible wheezing, pulse 100-120.    - SEVERE: Very SOB at rest, speaks in single words, struggling to breathe, sitting hunched forward, retractions, pulse > 120      Mild, using breathing treatments as prescribed.  6. FEVER: "Do you have a fever?" If Yes, ask: "What is your temperature, how was it measured, and when did it start?"     None reported 7. CARDIAC HISTORY: "Do you have any history of heart disease?" (e.g., heart attack, congestive heart failure)      None reported 8. LUNG HISTORY: "Do you have any history of lung disease?"  (e.g., pulmonary embolus,  asthma, emphysema)     None reported 9. PE RISK FACTORS: "Do you have a history of blood clots?" (or: recent major surgery, recent prolonged travel, bedridden)     no 10. OTHER SYMPTOMS: "Do you have any other symptoms?" (e.g., runny nose, wheezing, chest pain)       Diagnosed with pneumonia yesterday 11. PREGNANCY: "Is there any chance you are pregnant?" "When was your last menstrual period?"       Not asked  12. TRAVEL: "Have you traveled out of the country in the last month?" (e.g., travel history, exposures)       None reported  Protocols used: COUGHING UP BLOOD-A-AH

## 2019-11-29 NOTE — Telephone Encounter (Signed)
ED CM received call from patient concerning pharmacy needing  clarification on discharge prescriptions.  CM contacted [facility] on [address]. Clarification provided no other questions or concerns verbalized

## 2020-01-14 ENCOUNTER — Other Ambulatory Visit: Payer: Self-pay

## 2020-01-14 ENCOUNTER — Encounter (HOSPITAL_COMMUNITY): Payer: Self-pay

## 2020-01-14 ENCOUNTER — Ambulatory Visit (HOSPITAL_COMMUNITY)
Admission: EM | Admit: 2020-01-14 | Discharge: 2020-01-14 | Disposition: A | Discharge status: Home / Self Care | Payer: 59 | Attending: Internal Medicine | Admitting: Internal Medicine

## 2020-01-14 DIAGNOSIS — M79604 Pain in right leg: Secondary | ICD-10-CM | POA: Diagnosis not present

## 2020-01-14 HISTORY — DX: Essential (primary) hypertension: I10

## 2020-01-14 MED ORDER — KETOROLAC TROMETHAMINE 60 MG/2ML IM SOLN
INTRAMUSCULAR | Status: AC
  Filled 2020-01-14: qty 2

## 2020-01-14 MED ORDER — KETOROLAC TROMETHAMINE 60 MG/2ML IM SOLN
60.0000 mg | Freq: Once | INTRAMUSCULAR | Status: AC
  Administered 2020-01-14: 60 mg via INTRAMUSCULAR

## 2020-01-14 NOTE — ED Triage Notes (Signed)
Pt c/o pain anterior aspect lower tibia area down through right foot/ankle for approx 3 days. Pt states when she puts weight on right foot/ankle, pain increases. Has noticed swelling increasing to RLE over past several days. Denies injury/trauma to area.  Reports h/o BLE edema that normally improves.  Right lower leg with +2 edema, tender to even light touch. Denies any pain to left leg, but some swelling noted to left ankle/leg.

## 2020-01-14 NOTE — ED Provider Notes (Signed)
[place]    CSN: [id number] Arrival date & time: 01/14/20  1637      History   Chief Complaint Chief Complaint  Patient presents with  . Foot Pain  . Leg Pain    HPI Jessica Sanchez is a 34 y.o. female with pmh of htn, SVT, obesity presents to UC with complaints of right leg pain and swelling. Pt reports chronic LE swelling for which she wears compression stockings. Patient states she has been unable to wear compression stockings due to increased pain and swelling in right leg. Patient denies any trauma to affected leg, no recent fever chills or redness. Patient does work on feet for prolonged period of time. Patient denies being on any diuretic medication in the past. She denies any recent HA, dizziness, chest pain, SOB, orthopnea.    Past Medical History:  Diagnosis Date  . Fibroid (bleeding) (uterine)   . Fibroid, uterine   . H/O metrorrhagia   . Hypertension   . IUD (intrauterine device) in place 07/11/2015  . Menorrhagia   . Obesity, Class III, BMI 40-49.9 (morbid obesity) ([place]) 11/11/2018  . SVT (supraventricular tachycardia) [facility])     Patient Active Problem List   Diagnosis Date Noted  . Elevated troponin   . Attempted IUD removal, unsuccessful 07/05/2019  . Encounter for IUD insertion 07/05/2019  . Gastroesophageal reflux disease   . Nonspecific chest pain 11/11/2018  . Morbid obesity with body mass index (BMI) of 60.0 to 69.9 in adult ([place]) 11/11/2018  . Elevated d-dimer 11/11/2018  . SVT (supraventricular tachycardia) ([place]) 10/04/2018  . IUD (intrauterine device) in place 07/11/2015  . Other disorder of menstruation and other abnormal bleeding from female genital tract 10/30/2012    Past Surgical History:  Procedure Laterality Date  . HYSTEROSCOPY WITH D & C N/A 09/27/2013   Procedure: DILATATION AND CURETTAGE /HYSTEROSCOPY and insertion of Mirena IUD ;  Surgeon: [name]. Harrington Challenger, MD;  Location: [facility];  Service: Gynecology;  Laterality: N/A;  .  MYOMECTOMY  02/03/2011   Procedure: MYOMECTOMY;  Surgeon: [name], MD;  Location: [facility];  Service: Gynecology;  Laterality: N/A;  . SVT ABLATION N/A 11/08/2018   Procedure: SVT ABLATION;  Surgeon: [name], MD;  Location: [place] [facility];  Service: Cardiovascular;  Laterality: N/A;  . TONSILLECTOMY      OB History    Gravida  1   Para      Term      Preterm      AB  1   Living        SAB  1   TAB      Ectopic      Multiple      Live Births               Home Medications    Prior to Admission medications   Medication Sig Start Date End Date Taking? Authorizing Provider  albuterol (PROVENTIL HFA) 108 (90 Base) MCG/ACT inhaler Inhale 1-2 puffs into the lungs every 6 (six) hours as needed for wheezing or shortness of breath.    Yes [provider]  flecainide (TAMBOCOR) 150 MG tablet Take 0.5 tablets (75 mg total) by mouth 2 (two) times daily. 10/30/19  Yes [name], MD  amoxicillin (AMOXIL) 500 MG capsule Take 2 capsules (1,000 mg total) by mouth 2 (two) times daily. 11/29/19   Charlesetta Shanks, MD  diltiazem (CARDIZEM CD) 240 MG 24 hr capsule Take 1  capsule (240 mg total) by mouth daily. 06/14/19 06/08/20  Erma Heritage, PA-C  doxycycline (VIBRAMYCIN) 100 MG capsule Take 1 capsule (100 mg total) by mouth 2 (two) times daily. One po bid x 7 days 11/29/19   Charlesetta Shanks, MD  HYDROcodone-acetaminophen (NORCO/VICODIN) 5-325 MG tablet Take 1-2 tablets by mouth every 6 (six) hours as needed for moderate pain or severe pain. 11/28/19   Charlesetta Shanks, MD  levonorgestrel (MIRENA) 20 MCG/24HR IUD 1 each by Intrauterine route once. EVERY 5 YEARS    [provider]  Multiple Vitamins-Calcium (ONE-A-DAY WOMENS PO) Take 1 tablet by mouth daily.    [provider]  apixaban (ELIQUIS) 5 MG TABS tablet Take 2 tablets (10mg ) twice daily for 7 days, then 1 tablet (5mg ) twice daily 04/20/19 04/20/19  Renita Papa, PA-C    Family  History Family History  Problem Relation Age of Onset  . Cirrhosis Mother   . Diabetes Mother   . Cancer Maternal Grandfather   . Hypertension Sister   . Diabetes Sister   . Anesthesia problems Neg Hx   . Hypotension Neg Hx   . Malignant hyperthermia Neg Hx   . Pseudochol deficiency Neg Hx     Social History Social History   Tobacco Use  . Smoking status: Former Research scientist (life sciences)  . Smokeless tobacco: Never Used  Vaping Use  . Vaping Use: Never used  Substance Use Topics  . Alcohol use: Yes    Comment: occasional  . Drug use: No     Allergies   Shellfish allergy and Adhesive [tape]   Review of Systems As stated in HPI otherwise negative   Physical Exam Triage Vital Signs ED Triage Vitals  Enc Vitals Group     BP 01/14/20 1844 139/62     Pulse Rate 01/14/20 1844 93     Resp 01/14/20 1844 20     Temp 01/14/20 1844 98 F (36.7 C)     Temp Source 01/14/20 1844 Oral     SpO2 01/14/20 1844 99 %     Weight --      Height --      Head Circumference --      Peak Flow --      Pain Score 01/14/20 1842 10     Pain Loc --      Pain Edu? --      Excl. in [place]? --    No data found.  Updated Vital Signs BP 139/62 (BP Location: Left Wrist)   Pulse 93   Temp 98 F (36.7 C) (Oral)   Resp 20   SpO2 99%   Visual Acuity Right Eye Distance:   Left Eye Distance:   Bilateral Distance:    Right Eye Near:   Left Eye Near:    Bilateral Near:     Physical Exam Constitutional:      General: She is not in acute distress.    Appearance: Normal appearance. She is obese. She is not ill-appearing.  Cardiovascular:     Rate and Rhythm: Normal rate and regular rhythm.  Pulmonary:     Effort: Pulmonary effort is normal.     Breath sounds: Normal breath sounds.  Musculoskeletal:        General: Normal range of motion.  Skin:    General: Skin is warm and dry.     Findings: No erythema or rash.     Comments: Bilateral LE 2+ nonpitting edema. No obvious deformities, no erythema or  ecchymosis. 2+  DP pulse bilaterally  Neurological:     General: No focal deficit present.     Mental Status: She is alert.  Psychiatric:        Mood and Affect: Mood normal.        Behavior: Behavior normal.      UC Treatments / Results  Labs (all labs ordered are listed, but only abnormal results are displayed) Labs Reviewed - No data to display  EKG   Radiology No results found.  Procedures Procedures (including critical care time)  Medications Ordered in UC Medications  ketorolac (TORADOL) injection 60 mg (60 mg Intramuscular Given 01/14/20 1917)    Initial Impression / Assessment and Plan / UC Course  I have reviewed the triage vital signs and the nursing notes.  Pertinent labs & imaging results that were available during my care of the patient were reviewed by me and considered in my medical decision making (see chart for details).  Leg pain, right -Felt related to chronic venous insufficiency based on history and exam with mild increase in swelling over the past week as patient has been working on her feet and unable to wear compression stockings -No evidence of infectious etiology, CHF or DVT -NSAIDs as needed for pain. Ace wrap until swelling improves then transition back to compression stocking -Referral for PCP for consideration of low-dose diuretic -Return for any fever, worsening swelling, erythema, chest pain or shortness of breath   Final Clinical Impressions(s) / UC Diagnoses   Final diagnoses:  Right leg pain     Discharge Instructions     I feel like your leg pain is related to the swelling.  If you are not able to wear your compression stocking and you may use the Ace wrap as we discussed to help with the swelling.  I am also prescribing you an anti-inflammatory that should help with the pain.  Do not start until tomorrow as you received a shot of Toradol at urgent care.  I would like you to establish primary care and have referred you to [facility].  Please return should you develop any worsening pain, redness, fever or chills.    ED Prescriptions    None     PDMP not reviewed this encounter.   Rudolpho Sevin, NP 01/16/20 [phone]

## 2020-01-14 NOTE — Discharge Instructions (Addendum)
I feel like your leg pain is related to the swelling.  If you are not able to wear your compression stocking and you may use the Ace wrap as we discussed to help with the swelling.  I am also prescribing you an anti-inflammatory that should help with the pain.  Do not start until tomorrow as you received a shot of Toradol at urgent care.  I would like you to establish primary care and have referred you to [facility].  Please return should you develop any worsening pain, redness, fever or chills.

## 2020-01-28 ENCOUNTER — Other Ambulatory Visit: Payer: Self-pay

## 2020-01-28 ENCOUNTER — Encounter (HOSPITAL_COMMUNITY): Payer: Self-pay | Admitting: Emergency Medicine

## 2020-01-28 ENCOUNTER — Emergency Department (HOSPITAL_COMMUNITY)
Admission: EM | Admit: 2020-01-28 | Discharge: 2020-01-28 | Disposition: A | Discharge status: Home / Self Care | Payer: 59 | Attending: Emergency Medicine | Admitting: Emergency Medicine

## 2020-01-28 ENCOUNTER — Emergency Department (HOSPITAL_COMMUNITY): Payer: 59

## 2020-01-28 DIAGNOSIS — R519 Headache, unspecified: Secondary | ICD-10-CM | POA: Diagnosis present

## 2020-01-28 DIAGNOSIS — R251 Tremor, unspecified: Secondary | ICD-10-CM | POA: Diagnosis not present

## 2020-01-28 DIAGNOSIS — R0602 Shortness of breath: Secondary | ICD-10-CM | POA: Diagnosis not present

## 2020-01-28 DIAGNOSIS — R0789 Other chest pain: Secondary | ICD-10-CM | POA: Insufficient documentation

## 2020-01-28 DIAGNOSIS — Z5321 Procedure and treatment not carried out due to patient leaving prior to being seen by health care provider: Secondary | ICD-10-CM | POA: Diagnosis not present

## 2020-01-28 DIAGNOSIS — Z7982 Long term (current) use of aspirin: Secondary | ICD-10-CM | POA: Insufficient documentation

## 2020-01-28 DIAGNOSIS — R109 Unspecified abdominal pain: Secondary | ICD-10-CM | POA: Diagnosis not present

## 2020-01-28 LAB — I-STAT BETA HCG BLOOD, ED (MC, WL, AP ONLY): I-stat hCG, quantitative: <5 m[IU]/mL (ref <5)

## 2020-01-28 LAB — BASIC METABOLIC PANEL
Anion gap: 10 (ref 5–15)
BUN: 12 mg/dL (ref 6–20)
CO2: 24 mmol/L (ref 22–32)
Calcium: 9 mg/dL (ref 8.9–10.3)
Chloride: 104 mmol/L (ref 98–111)
Creatinine, Ser: 0.7 mg/dL (ref 0.44–1.00)
GFR calc Af Amer: >60 mL/min (ref >60)
GFR calc non Af Amer: >60 mL/min (ref >60)
Glucose, Bld: 111 mg/dL — ABNORMAL HIGH (ref 70–99)
Potassium: 3.8 mmol/L (ref 3.5–5.1)
Sodium: 138 mmol/L (ref 135–145)

## 2020-01-28 LAB — CBC
HCT: 38.3 % (ref 36.0–46.0)
Hemoglobin: 11.8 g/dL — ABNORMAL LOW (ref 12.0–15.0)
MCH: 26.6 pg (ref 26.0–34.0)
MCHC: 30.8 g/dL (ref 30.0–36.0)
MCV: 86.5 fL (ref 80.0–100.0)
Platelets: 336 10*3/uL (ref 150–400)
RBC: 4.43 MIL/uL (ref 3.87–5.11)
RDW: 14.8 % (ref 11.5–15.5)
WBC: 6.3 10*3/uL (ref 4.0–10.5)
nRBC: 0 % (ref 0.0–0.2)

## 2020-01-28 LAB — TROPONIN I (HIGH SENSITIVITY): Troponin I (High Sensitivity): <2 ng/L (ref <18)

## 2020-01-28 NOTE — ED Triage Notes (Signed)
Arrived via EMS patient stated developed a headache yesterday and while at work today developed tremors, intermittent stuttering chest pain, abdominal pain, and shortness of breath. Patient left worked walked to a near by store and EMS was called. EMS administered aspirin 324 mg and 1 tab SL Nitro. Chest pain remained the same 9/10 sharp radiating to right breast.

## 2020-01-29 ENCOUNTER — Encounter (HOSPITAL_COMMUNITY): Payer: Self-pay

## 2020-01-29 ENCOUNTER — Other Ambulatory Visit: Payer: Self-pay

## 2020-01-29 ENCOUNTER — Emergency Department (HOSPITAL_COMMUNITY)
Admission: EM | Admit: 2020-01-29 | Discharge: 2020-01-29 | Disposition: A | Discharge status: Home / Self Care | Payer: 59 | Attending: Emergency Medicine | Admitting: Emergency Medicine

## 2020-01-29 ENCOUNTER — Encounter (HOSPITAL_COMMUNITY): Payer: Self-pay | Admitting: Emergency Medicine

## 2020-01-29 ENCOUNTER — Ambulatory Visit (HOSPITAL_COMMUNITY)
Admission: EM | Admit: 2020-01-29 | Discharge: 2020-01-29 | Disposition: A | Discharge status: Nursing Home (Includes ICF) | Payer: 59 | Attending: Emergency Medicine | Admitting: Emergency Medicine

## 2020-01-29 DIAGNOSIS — H538 Other visual disturbances: Secondary | ICD-10-CM | POA: Diagnosis not present

## 2020-01-29 DIAGNOSIS — R079 Chest pain, unspecified: Secondary | ICD-10-CM

## 2020-01-29 DIAGNOSIS — Z5321 Procedure and treatment not carried out due to patient leaving prior to being seen by health care provider: Secondary | ICD-10-CM | POA: Diagnosis not present

## 2020-01-29 DIAGNOSIS — R519 Headache, unspecified: Secondary | ICD-10-CM | POA: Diagnosis not present

## 2020-01-29 DIAGNOSIS — F8081 Childhood onset fluency disorder: Secondary | ICD-10-CM

## 2020-01-29 DIAGNOSIS — R0602 Shortness of breath: Secondary | ICD-10-CM

## 2020-01-29 DIAGNOSIS — R030 Elevated blood-pressure reading, without diagnosis of hypertension: Secondary | ICD-10-CM

## 2020-01-29 LAB — TROPONIN I (HIGH SENSITIVITY): Troponin I (High Sensitivity): <2 ng/L (ref <18)

## 2020-01-29 NOTE — ED Notes (Signed)
Called patient to check vital signs, no answer

## 2020-01-29 NOTE — ED Triage Notes (Signed)
Pt arrives via [facility] from UC for c/o chest pain. Ems states patient was seen here yesterday for the same but LWBS. Pt sent down from UC for further eval. C/o cp is central, endorses "shaking all over" and some blurred vision with a headache last night. EMS VS 126/86, hr88 rr 24, 99% ra, 97.6 temp. A/ox4, resp e/u, nad.

## 2020-01-29 NOTE — ED Notes (Signed)
Patient is being discharged from the Urgent Care and sent to the Emergency Department via EMS. Per K. Hall Busing, patient is in need of higher level of care due to CP, weakness. Patient is aware and verbalizes understanding of plan of care.  Vitals:   01/29/20 1145  BP: (!) 131/101  Pulse: 91  Resp: 20  Temp: 97.7 F (36.5 C)  SpO2: 99%

## 2020-01-29 NOTE — ED Notes (Signed)
Call EMS to transport pt to [facility]

## 2020-01-29 NOTE — ED Triage Notes (Signed)
Pt c/o sharp central CP and right sided CP 10/10 scale onset yesterday while at work. Pt states while at work yesterday, "co-workers noticed I was shaking all over", then experienced HA, began with new onset stuttering, and CP, tingling to right arm, blurred vision. Pt walked to store and EMS was called, pt LWBS from ER last night. States CP and other symptoms have not improved.   Smile symmetrical, no arm drift, grips equal/strong, legs equal/strong. Pt able to follow complex commands, able to remove, carry and apply very heavy backpack with right arm.  EKG performed and K. Hall Busing, NP notified of pt status. K. Hall Busing stated could be further evaluated here but then would need to go to ER.

## 2020-01-29 NOTE — ED Notes (Signed)
Pt had labs done yesterday.

## 2020-01-29 NOTE — ED Provider Notes (Signed)
[place]    CSN: [id number] Arrival date & time: 01/29/20  1116      History   Chief Complaint Chief Complaint  Patient presents with  . Chest Pain    HPI Jessica Sanchez is a 34 y.o. female.   Patient presents with ongoing chest pain, shortness of breath, headache, new onset stuttering, tingling in her right arm, blurred vision since yesterday.  She went to the ED yesterday via EMS but left the ED prior to being seen; troponin negative at that time.  The history is provided by the patient and medical records.    Past Medical History:  Diagnosis Date  . Fibroid (bleeding) (uterine)   . Fibroid, uterine   . H/O metrorrhagia   . Hypertension   . IUD (intrauterine device) in place 07/11/2015  . Menorrhagia   . Obesity, Class III, BMI 40-49.9 (morbid obesity) ([place]) 11/11/2018  . SVT (supraventricular tachycardia) [facility])     Patient Active Problem List   Diagnosis Date Noted  . Elevated troponin   . Attempted IUD removal, unsuccessful 07/05/2019  . Encounter for IUD insertion 07/05/2019  . Gastroesophageal reflux disease   . Nonspecific chest pain 11/11/2018  . Morbid obesity with body mass index (BMI) of 60.0 to 69.9 in adult ([place]) 11/11/2018  . Elevated d-dimer 11/11/2018  . SVT (supraventricular tachycardia) ([place]) 10/04/2018  . IUD (intrauterine device) in place 07/11/2015  . Other disorder of menstruation and other abnormal bleeding from female genital tract 10/30/2012    Past Surgical History:  Procedure Laterality Date  . HYSTEROSCOPY WITH D & C N/A 09/27/2013   Procedure: DILATATION AND CURETTAGE /HYSTEROSCOPY and insertion of Mirena IUD ;  Surgeon: [name]. Harrington Challenger, MD;  Location: [facility];  Service: Gynecology;  Laterality: N/A;  . MYOMECTOMY  02/03/2011   Procedure: MYOMECTOMY;  Surgeon: [name], MD;  Location: [facility];  Service: Gynecology;  Laterality: N/A;  . SVT ABLATION N/A 11/08/2018   Procedure: SVT ABLATION;  Surgeon: [name], MD;   Location: [place] [facility];  Service: Cardiovascular;  Laterality: N/A;  . TONSILLECTOMY      OB History    Gravida  1   Para      Term      Preterm      AB  1   Living        SAB  1   TAB      Ectopic      Multiple      Live Births               Home Medications    Prior to Admission medications   Medication Sig Start Date End Date Taking? Authorizing Provider  albuterol (PROVENTIL HFA) 108 (90 Base) MCG/ACT inhaler Inhale 1-2 puffs into the lungs every 6 (six) hours as needed for wheezing or shortness of breath.    Yes [provider]  flecainide (TAMBOCOR) 150 MG tablet Take 0.5 tablets (75 mg total) by mouth 2 (two) times daily. 10/30/19  Yes [name], MD  levonorgestrel (MIRENA) 20 MCG/24HR IUD 1 each by Intrauterine route once. EVERY 5 YEARS   Yes [provider]  amoxicillin (AMOXIL) 500 MG capsule Take 2 capsules (1,000 mg total) by mouth 2 (two) times daily. 11/29/19   Charlesetta Shanks, MD  diltiazem (CARDIZEM CD) 240 MG 24 hr capsule Take 1 capsule (240 mg total) by mouth daily. 06/14/19 06/08/20  Erma Heritage, PA-C  doxycycline (  VIBRAMYCIN) 100 MG capsule Take 1 capsule (100 mg total) by mouth 2 (two) times daily. One po bid x 7 days 11/29/19   Charlesetta Shanks, MD  HYDROcodone-acetaminophen (NORCO/VICODIN) 5-325 MG tablet Take 1-2 tablets by mouth every 6 (six) hours as needed for moderate pain or severe pain. 11/28/19   Charlesetta Shanks, MD  Multiple Vitamins-Calcium (ONE-A-DAY WOMENS PO) Take 1 tablet by mouth daily.    [provider]  apixaban (ELIQUIS) 5 MG TABS tablet Take 2 tablets (10mg ) twice daily for 7 days, then 1 tablet (5mg ) twice daily 04/20/19 04/20/19  Renita Papa, PA-C    Family History Family History  Problem Relation Age of Onset  . Cirrhosis Mother   . Diabetes Mother   . Cancer Maternal Grandfather   . Hypertension Sister   . Diabetes Sister   . Anesthesia problems Neg Hx   . Hypotension Neg  Hx   . Malignant hyperthermia Neg Hx   . Pseudochol deficiency Neg Hx     Social History Social History   Tobacco Use  . Smoking status: Former Research scientist (life sciences)  . Smokeless tobacco: Never Used  Vaping Use  . Vaping Use: Never used  Substance Use Topics  . Alcohol use: Yes    Comment: occasional  . Drug use: No     Allergies   Shellfish allergy and Adhesive [tape]   Review of Systems Review of Systems  Constitutional: Negative for chills and fever.  HENT: Negative for ear pain and sore throat.   Eyes: Positive for visual disturbance. Negative for pain.  Respiratory: Positive for shortness of breath. Negative for cough.   Cardiovascular: Positive for chest pain. Negative for palpitations.  Gastrointestinal: Negative for abdominal pain and vomiting.  Genitourinary: Negative for dysuria and hematuria.  Musculoskeletal: Negative for arthralgias and back pain.  Skin: Negative for color change and rash.  Neurological: Positive for headaches. Negative for seizures and syncope.       New onset stuttering.  All other systems reviewed and are negative.    Physical Exam Triage Vital Signs ED Triage Vitals  Enc Vitals Group     BP      Pulse      Resp      Temp      Temp src      SpO2      Weight      Height      Head Circumference      Peak Flow      Pain Score      Pain Loc      Pain Edu?      Excl. in [place]?    No data found.  Updated Vital Signs BP (!) 131/101 (BP Location: Right Wrist)   Pulse 91   Temp 97.7 F (36.5 C) (Temporal)   Resp 20   SpO2 99%   Visual Acuity Right Eye Distance:   Left Eye Distance:   Bilateral Distance:    Right Eye Near:   Left Eye Near:    Bilateral Near:     Physical Exam Vitals and nursing note reviewed.  Constitutional:      General: She is not in acute distress.    Appearance: She is well-developed. She is obese.  HENT:     Head: Normocephalic and atraumatic.     Mouth/Throat:     Mouth: Mucous membranes are moist.      Comments: Intermittent stuttering with speech.  Eyes:     Extraocular Movements: Extraocular movements  intact.     Conjunctiva/sclera: Conjunctivae normal.     Pupils: Pupils are equal, round, and reactive to light.  Cardiovascular:     Rate and Rhythm: Normal rate and regular rhythm.     Heart sounds: Normal heart sounds. No murmur heard.   Pulmonary:     Effort: Pulmonary effort is normal. No respiratory distress.     Breath sounds: Normal breath sounds.  Abdominal:     Palpations: Abdomen is soft.     Tenderness: There is no abdominal tenderness. There is no guarding or rebound.  Musculoskeletal:     Cervical back: Neck supple.  Skin:    General: Skin is warm and dry.  Neurological:     General: No focal deficit present.     Mental Status: She is alert and oriented to person, place, and time.     Cranial Nerves: No cranial nerve deficit.     Sensory: No sensory deficit.     Motor: No weakness.     Coordination: Coordination normal.     Gait: Gait normal.  Psychiatric:        Mood and Affect: Mood normal.        Behavior: Behavior normal.      UC Treatments / Results  Labs (all labs ordered are listed, but only abnormal results are displayed) Labs Reviewed - No data to display  EKG   Radiology DG Chest 2 View  Result Date: 01/28/2020 CLINICAL DATA:  Chest pain and shortness of breath EXAM: CHEST - 2 VIEW COMPARISON:  November 28, 2019 FINDINGS: There is been interval clearing of infiltrate from the right upper lobe. Currently lungs are clear. There is cardiomegaly with pulmonary vascularity normal. No adenopathy. No pneumothorax. No bone lesions IMPRESSION: Lungs clear.  Cardiomegaly.  No pneumothorax. Electronically Signed   By: Lowella Grip III M.D.   On: 01/28/2020 16:26    Procedures Procedures (including critical care time)  Medications Ordered in UC Medications - No data to display  Initial Impression / Assessment and Plan / UC Course  I have reviewed  the triage vital signs and the nursing notes.  Pertinent labs & imaging results that were available during my care of the patient were reviewed by me and considered in my medical decision making (see chart for details).   Chest pain, shortness of breath, acute headache, blurred vision, stuttering, elevated blood pressure reading.  EKG shows sinus rhythm, rate 90, no ST elevation, compared to previous from yesterday.  Sending patient to the ED for evaluation.  She does not feel stable to transport herself and does not have a driver available.  She will be transported by EMS.   Final Clinical Impressions(s) / UC Diagnoses   Final diagnoses:  Chest pain, unspecified type  Stuttering  Blurred vision  Acute nonintractable headache, unspecified headache type  Shortness of breath  Elevated blood pressure reading   Discharge Instructions   None    ED Prescriptions    None     PDMP not reviewed this encounter.   Sharion Balloon, NP 01/29/20 1204

## 2020-02-04 ENCOUNTER — Encounter (HOSPITAL_COMMUNITY): Payer: Self-pay | Admitting: Emergency Medicine

## 2020-02-04 ENCOUNTER — Emergency Department (HOSPITAL_COMMUNITY)
Admission: EM | Admit: 2020-02-04 | Discharge: 2020-02-05 | Disposition: A | Discharge status: Home / Self Care | Payer: 59 | Attending: Emergency Medicine | Admitting: Emergency Medicine

## 2020-02-04 ENCOUNTER — Other Ambulatory Visit: Payer: Self-pay

## 2020-02-04 DIAGNOSIS — R519 Headache, unspecified: Secondary | ICD-10-CM | POA: Insufficient documentation

## 2020-02-04 DIAGNOSIS — B3731 Acute candidiasis of vulva and vagina: Secondary | ICD-10-CM

## 2020-02-04 DIAGNOSIS — R1031 Right lower quadrant pain: Secondary | ICD-10-CM | POA: Diagnosis present

## 2020-02-04 DIAGNOSIS — D259 Leiomyoma of uterus, unspecified: Secondary | ICD-10-CM | POA: Insufficient documentation

## 2020-02-04 DIAGNOSIS — B9689 Other specified bacterial agents as the cause of diseases classified elsewhere: Secondary | ICD-10-CM | POA: Diagnosis not present

## 2020-02-04 DIAGNOSIS — I10 Essential (primary) hypertension: Secondary | ICD-10-CM | POA: Insufficient documentation

## 2020-02-04 DIAGNOSIS — Z7982 Long term (current) use of aspirin: Secondary | ICD-10-CM | POA: Insufficient documentation

## 2020-02-04 DIAGNOSIS — Z79899 Other long term (current) drug therapy: Secondary | ICD-10-CM | POA: Diagnosis not present

## 2020-02-04 DIAGNOSIS — Z87891 Personal history of nicotine dependence: Secondary | ICD-10-CM | POA: Insufficient documentation

## 2020-02-04 DIAGNOSIS — N760 Acute vaginitis: Secondary | ICD-10-CM | POA: Insufficient documentation

## 2020-02-04 DIAGNOSIS — N739 Female pelvic inflammatory disease, unspecified: Secondary | ICD-10-CM | POA: Insufficient documentation

## 2020-02-04 DIAGNOSIS — A599 Trichomoniasis, unspecified: Secondary | ICD-10-CM | POA: Insufficient documentation

## 2020-02-04 DIAGNOSIS — Z7901 Long term (current) use of anticoagulants: Secondary | ICD-10-CM | POA: Diagnosis not present

## 2020-02-04 DIAGNOSIS — B373 Candidiasis of vulva and vagina: Secondary | ICD-10-CM | POA: Insufficient documentation

## 2020-02-04 DIAGNOSIS — N3000 Acute cystitis without hematuria: Secondary | ICD-10-CM | POA: Insufficient documentation

## 2020-02-04 DIAGNOSIS — R102 Pelvic and perineal pain: Secondary | ICD-10-CM

## 2020-02-04 LAB — URINALYSIS, ROUTINE W REFLEX MICROSCOPIC
Bilirubin Urine: NEGATIVE
Glucose, UA: NEGATIVE mg/dL
Ketones, ur: NEGATIVE mg/dL
Nitrite: NEGATIVE
Protein, ur: NEGATIVE mg/dL
Specific Gravity, Urine: 1.027 (ref 1.005–1.030)
pH: 5 (ref 5.0–8.0)

## 2020-02-04 LAB — COMPREHENSIVE METABOLIC PANEL
ALT: 18 U/L (ref 0–44)
AST: 16 U/L (ref 15–41)
Albumin: 3.8 g/dL (ref 3.5–5.0)
Alkaline Phosphatase: 64 U/L (ref 38–126)
Anion gap: 10 (ref 5–15)
BUN: 9 mg/dL (ref 6–20)
CO2: 25 mmol/L (ref 22–32)
Calcium: 9.4 mg/dL (ref 8.9–10.3)
Chloride: 104 mmol/L (ref 98–111)
Creatinine, Ser: 0.85 mg/dL (ref 0.44–1.00)
GFR calc Af Amer: >60 mL/min (ref >60)
GFR calc non Af Amer: >60 mL/min (ref >60)
Glucose, Bld: 79 mg/dL (ref 70–99)
Potassium: 4 mmol/L (ref 3.5–5.1)
Sodium: 139 mmol/L (ref 135–145)
Total Bilirubin: 0.6 mg/dL (ref 0.3–1.2)
Total Protein: 7.4 g/dL (ref 6.5–8.1)

## 2020-02-04 LAB — I-STAT BETA HCG BLOOD, ED (MC, WL, AP ONLY): I-stat hCG, quantitative: <5 m[IU]/mL (ref <5)

## 2020-02-04 LAB — CBC
HCT: 39.8 % (ref 36.0–46.0)
Hemoglobin: 12.3 g/dL (ref 12.0–15.0)
MCH: 26.3 pg (ref 26.0–34.0)
MCHC: 30.9 g/dL (ref 30.0–36.0)
MCV: 85.2 fL (ref 80.0–100.0)
Platelets: 332 10*3/uL (ref 150–400)
RBC: 4.67 MIL/uL (ref 3.87–5.11)
RDW: 14.8 % (ref 11.5–15.5)
WBC: 6.5 10*3/uL (ref 4.0–10.5)
nRBC: 0 % (ref 0.0–0.2)

## 2020-02-04 LAB — TROPONIN I (HIGH SENSITIVITY)
Troponin I (High Sensitivity): <2 ng/L (ref <18)
Troponin I (High Sensitivity): <2 ng/L (ref <18)

## 2020-02-04 LAB — LIPASE, BLOOD: Lipase: 24 U/L (ref 11–51)

## 2020-02-04 MED ORDER — ACETAMINOPHEN 325 MG PO TABS
650.0000 mg | ORAL_TABLET | Freq: Once | ORAL | Status: AC
  Administered 2020-02-04: 650 mg via ORAL
  Filled 2020-02-04: qty 2

## 2020-02-04 NOTE — ED Notes (Signed)
Pt medicated for pain.

## 2020-02-04 NOTE — ED Triage Notes (Signed)
Pt. Stated, I started having abdominal pain along with chest pain started this morning.  Have stomach pain.

## 2020-02-05 ENCOUNTER — Emergency Department (HOSPITAL_COMMUNITY): Payer: 59

## 2020-02-05 ENCOUNTER — Other Ambulatory Visit: Payer: Self-pay

## 2020-02-05 LAB — WET PREP, GENITAL: Sperm: NONE SEEN

## 2020-02-05 MED ORDER — DOXYCYCLINE HYCLATE 100 MG PO TABS
100.0000 mg | ORAL_TABLET | Freq: Once | ORAL | Status: AC
  Administered 2020-02-05: 100 mg via ORAL
  Filled 2020-02-05: qty 1

## 2020-02-05 MED ORDER — FLUCONAZOLE 200 MG PO TABS: ORAL_TABLET | ORAL | 0 refills | Status: DC

## 2020-02-05 MED ORDER — KETOROLAC TROMETHAMINE 30 MG/ML IJ SOLN
30.0000 mg | Freq: Once | INTRAMUSCULAR | Status: AC
  Administered 2020-02-05: 30 mg via INTRAVENOUS
  Filled 2020-02-05: qty 1

## 2020-02-05 MED ORDER — SODIUM CHLORIDE 0.9 % IV SOLN
1.0000 g | Freq: Once | INTRAVENOUS | Status: AC
  Administered 2020-02-05: 1 g via INTRAVENOUS
  Filled 2020-02-05: qty 10

## 2020-02-05 MED ORDER — CEPHALEXIN 250 MG PO CAPS
500.0000 mg | ORAL_CAPSULE | Freq: Once | ORAL | Status: AC
  Administered 2020-02-05: 500 mg via ORAL
  Filled 2020-02-05: qty 2

## 2020-02-05 MED ORDER — METRONIDAZOLE 500 MG PO TABS
500.0000 mg | ORAL_TABLET | Freq: Once | ORAL | Status: AC
  Administered 2020-02-05: 500 mg via ORAL
  Filled 2020-02-05: qty 1

## 2020-02-05 MED ORDER — METRONIDAZOLE 500 MG PO TABS: 500.0000 mg | ORAL_TABLET | Freq: Two times a day (BID) | ORAL | 0 refills | Status: AC

## 2020-02-05 MED ORDER — DOXYCYCLINE HYCLATE 100 MG PO CAPS: 100.0000 mg | ORAL_CAPSULE | Freq: Two times a day (BID) | ORAL | 0 refills | Status: AC

## 2020-02-05 MED ORDER — CEPHALEXIN 500 MG PO CAPS: 500.0000 mg | ORAL_CAPSULE | Freq: Three times a day (TID) | ORAL | 0 refills | Status: AC

## 2020-02-05 NOTE — ED Provider Notes (Signed)
[facility] DEPARTMENT Provider Note   CSN: [id number] Arrival date & time: 02/04/20  1613     History Chief Complaint  Patient presents with   Abdominal Pain   Chest Pain    Jessica Sanchez is a 34 y.o. female possible history of fibroids, hypertension, SVT who presents for evaluation of right-sided abdominal pain that began yesterday.  She reports that she had noticed some discomfort in her abdomen prior to going to work but when she went to work, the pain became more severe, more sharp.  She states it is in the right lower side and radiates to the back.  She states that she has tried taking Tylenol with no improvement in symptoms.  States she felt feverish at home but never measured temperature.  She has had some nausea but denies any vomiting.  She states that over the last 2 days, she has had some dysuria and hematuria.  He has not noted any vaginal discharge.  She does not have periods regularly given her IUD which she states is normal for her.  She also reports that over the last 24 hours, she started developing some midsternal chest pain.  She does not know if the pain is connected to the pain in her abdomen.  She states that it is improved since it first started.  She denies any worsening shortness of breath, vomiting.  She has had history of surgery for uterine fibroids.  No other abdominal surgery.  The history is provided by the patient.       Past Medical History:  Diagnosis Date   Fibroid (bleeding) (uterine)    Fibroid, uterine    H/O metrorrhagia    Hypertension    IUD (intrauterine device) in place 07/11/2015   Menorrhagia    Obesity, Class III, BMI 40-49.9 (morbid obesity) ([place]) 11/11/2018   SVT (supraventricular tachycardia) ([place])     Patient Active Problem List   Diagnosis Date Noted   Elevated troponin    Attempted IUD removal, unsuccessful 07/05/2019   Encounter for IUD insertion 07/05/2019   Gastroesophageal reflux  disease    Nonspecific chest pain 11/11/2018   Morbid obesity with body mass index (BMI) of 60.0 to 69.9 in adult [facility]) 11/11/2018   Elevated d-dimer 11/11/2018   SVT (supraventricular tachycardia) ([place]) 10/04/2018   IUD (intrauterine device) in place 07/11/2015   Other disorder of menstruation and other abnormal bleeding from female genital tract 10/30/2012    Past Surgical History:  Procedure Laterality Date   HYSTEROSCOPY WITH D & C N/A 09/27/2013   Procedure: DILATATION AND CURETTAGE /HYSTEROSCOPY and insertion of Mirena IUD ;  Surgeon: [name]. Harrington Challenger, MD;  Location: [facility];  Service: Gynecology;  Laterality: N/A;   MYOMECTOMY  02/03/2011   Procedure: MYOMECTOMY;  Surgeon: [name], MD;  Location: [facility];  Service: Gynecology;  Laterality: N/A;   SVT ABLATION N/A 11/08/2018   Procedure: SVT ABLATION;  Surgeon: [name], MD;  Location: [facility];  Service: Cardiovascular;  Laterality: N/A;   TONSILLECTOMY       OB History    Gravida  1   Para      Term      Preterm      AB  1   Living        SAB  1   TAB      Ectopic      Multiple      Live Births  Family History  Problem Relation Age of Onset   Cirrhosis Mother    Diabetes Mother    Cancer Maternal Grandfather    Hypertension Sister    Diabetes Sister    Anesthesia problems Neg Hx    Hypotension Neg Hx    Malignant hyperthermia Neg Hx    Pseudochol deficiency Neg Hx     Social History   Tobacco Use   Smoking status: Former Smoker   Smokeless tobacco: Never Used  Scientific laboratory technician Use: Never used  Substance Use Topics   Alcohol use: Yes    Comment: occasional   Drug use: No    Home Medications Prior to Admission medications   Medication Sig Start Date End Date Taking? Authorizing Provider  aspirin 325 MG tablet Take 325 mg by mouth daily as needed for mild pain.   Yes [provider]  flecainide (TAMBOCOR) 150 MG tablet  Take 0.5 tablets (75 mg total) by mouth 2 (two) times daily. 10/30/19  Yes [name], MD  HYDROcodone-acetaminophen (NORCO/VICODIN) 5-325 MG tablet Take 1-2 tablets by mouth every 6 (six) hours as needed for moderate pain or severe pain. 11/28/19  Yes Charlesetta Shanks, MD  levonorgestrel (MIRENA) 20 MCG/24HR IUD 1 each by Intrauterine route once. EVERY 5 YEARS   Yes [provider]  Multiple Vitamins-Calcium (ONE-A-DAY WOMENS PO) Take 1 tablet by mouth daily.   Yes [provider]  albuterol (PROVENTIL HFA) 108 (90 Base) MCG/ACT inhaler Inhale 1-2 puffs into the lungs every 6 (six) hours as needed for wheezing or shortness of breath.     [provider]  amoxicillin (AMOXIL) 500 MG capsule Take 2 capsules (1,000 mg total) by mouth 2 (two) times daily. Patient not taking: Reported on 02/05/2020 11/29/19   Charlesetta Shanks, MD  cephALEXin (KEFLEX) 500 MG capsule Take 1 capsule (500 mg total) by mouth 3 (three) times daily for 7 days. 02/05/20 02/12/20  Volanda Napoleon, PA-C  diltiazem (CARDIZEM CD) 240 MG 24 hr capsule Take 1 capsule (240 mg total) by mouth daily. Patient not taking: Reported on 02/05/2020 06/14/19 06/08/20  Erma Heritage, PA-C  doxycycline (VIBRAMYCIN) 100 MG capsule Take 1 capsule (100 mg total) by mouth 2 (two) times daily for 14 days. 02/05/20 02/19/20  Volanda Napoleon, PA-C  fluconazole (DIFLUCAN) 200 MG tablet Take 1 tablet by mouth today.  Take the next tablet by mouth whenever you finish your antibiotics. 02/05/20   Volanda Napoleon, PA-C  metroNIDAZOLE (FLAGYL) 500 MG tablet Take 1 tablet (500 mg total) by mouth 2 (two) times daily for 14 days. 02/05/20 02/19/20  Volanda Napoleon, PA-C  apixaban (ELIQUIS) 5 MG TABS tablet Take 2 tablets (10mg ) twice daily for 7 days, then 1 tablet (5mg ) twice daily 04/20/19 04/20/19  Rodell Perna A, PA-C    Allergies    Shellfish allergy, Sulfa antibiotics, and Adhesive [tape]  Review of Systems   Review of  Systems  Constitutional: Negative for fever.  Respiratory: Negative for cough and shortness of breath.   Cardiovascular: Positive for chest pain.  Gastrointestinal: Positive for abdominal pain and nausea. Negative for vomiting.  Genitourinary: Positive for dysuria, flank pain and hematuria.  All other systems reviewed and are negative.   Physical Exam Updated Vital Signs BP 120/66 (BP Location: Left Arm)    Pulse 76    Temp 98.6 F (37 C) (Oral)    Resp 18    Ht 5\' 2"  (1.575 m)  Wt (!) 145.2 kg    SpO2 98%    BMI 58.53 kg/m   Physical Exam Vitals and nursing note reviewed. Exam conducted with a chaperone present.  Constitutional:      Appearance: Normal appearance. She is well-developed.     Comments: Appears uncomfortable but NAD  HENT:     Head: Normocephalic and atraumatic.  Eyes:     General: Lids are normal.     Conjunctiva/sclera: Conjunctivae normal.     Pupils: Pupils are equal, round, and reactive to light.  Cardiovascular:     Rate and Rhythm: Normal rate and regular rhythm.     Pulses: Normal pulses.     Heart sounds: Normal heart sounds. No murmur heard.  No friction rub. No gallop.   Pulmonary:     Effort: Pulmonary effort is normal.     Breath sounds: Normal breath sounds.     Comments: Lungs clear to auscultation bilaterally.  Symmetric chest rise.  No wheezing, rales, rhonchi. Abdominal:     Palpations: Abdomen is soft. Abdomen is not rigid.     Tenderness: There is abdominal tenderness in the right lower quadrant. There is right CVA tenderness. There is no guarding.     Comments: Abdomen soft, nondistended.  Tenderness palpation noted to the mid right abdomen as well as the lower abdomen diffusely.  No focal tenderness noted McBurney's point.  Right-sided CVA tenderness noted.  No Murphy sign.  Genitourinary:    Comments: The exam was performed with a chaperone present. Normal external female genitalia. No lesions, rash, or sores.  Right adnexal tenderness  noted.  No mass.  No left adnexal tenderness.  Some mild tenderness with palpation of the cervix.  She had copious amount of white discharge noted in the vaginal vault.  Difficult visualization of the cervix secondary to body habitus.  Unable to visualize IUD strings. Musculoskeletal:        General: Normal range of motion.     Cervical back: Full passive range of motion without pain.  Skin:    General: Skin is warm and dry.     Capillary Refill: Capillary refill takes less than 2 seconds.  Neurological:     Mental Status: She is alert and oriented to person, place, and time.     Comments: Cranial nerves III-XII intact Follows commands, Moves all extremities  5/5 strength to BUE and BLE  Sensation intact throughout all major nerve distributions No slurred speech. No facial droop.   Psychiatric:        Speech: Speech normal.     ED Results / Procedures / Treatments   Labs (all labs ordered are listed, but only abnormal results are displayed) Labs Reviewed  WET PREP, GENITAL - Abnormal; Notable for the following components:      Result Value   Yeast Wet Prep HPF POC PRESENT (*)    Trich, Wet Prep PRESENT (*)    Clue Cells Wet Prep HPF POC PRESENT (*)    WBC, Wet Prep HPF POC MANY (*)    All other components within normal limits  URINALYSIS, ROUTINE W REFLEX MICROSCOPIC - Abnormal; Notable for the following components:   APPearance HAZY (*)    Hgb urine dipstick MODERATE (*)    Leukocytes,Ua MODERATE (*)    Bacteria, UA MANY (*)    All other components within normal limits  URINE CULTURE  LIPASE, BLOOD  COMPREHENSIVE METABOLIC PANEL  CBC  I-STAT BETA HCG BLOOD, ED ([facility], [facility], [facility] ONLY)  GC/CHLAMYDIA PROBE AMP ([place]) NOT AT [facility]  TROPONIN I (HIGH SENSITIVITY)  TROPONIN I (HIGH SENSITIVITY)    EKG None  Radiology CT Head Wo Contrast  Result Date: 02/05/2020 CLINICAL DATA:  Headache, migraine EXAM: CT HEAD WITHOUT CONTRAST TECHNIQUE: Contiguous axial images were  obtained from the base of the skull through the vertex without intravenous contrast. COMPARISON:  12/11/2015 FINDINGS: Brain: No evidence of acute infarction, hemorrhage, hydrocephalus, extra-axial collection or mass lesion/mass effect. Vascular: No hyperdense vessel or unexpected calcification. Skull: Normal. Negative for fracture or focal lesion. Sinuses/Orbits: Small amount of mucosal thickening within the inferior aspect of the bilateral maxillary sinuses. Remaining paranasal sinuses and mastoid air cells are clear. Orbital structures unremarkable. Other: None. IMPRESSION: 1. No acute intracranial findings. 2. Mild bilateral maxillary sinus disease. Electronically Signed   By: Davina Poke D.O.   On: 02/05/2020 14:05   DG Chest Portable 1 View  Result Date: 02/05/2020 CLINICAL DATA:  Chest pain EXAM: PORTABLE CHEST 1 VIEW COMPARISON:  Chest radiograph dated 01/28/2020 FINDINGS: The heart remains enlarged. Both lungs are clear. The visualized skeletal structures are unremarkable. IMPRESSION: No active disease. Electronically Signed   By: Zerita Boers M.D.   On: 02/05/2020 09:22   CT Renal Stone Study  Result Date: 02/05/2020 CLINICAL DATA:  Flank pain EXAM: CT ABDOMEN AND PELVIS WITHOUT CONTRAST TECHNIQUE: Multidetector CT imaging of the abdomen and pelvis was performed following the standard protocol without IV contrast. COMPARISON:  CT abdomen pelvis dated 12/17/2018. FINDINGS: Lower chest: Minimal bibasilar atelectasis/scarring. Hepatobiliary: No focal liver abnormality is seen. No gallstones, gallbladder wall thickening, or biliary dilatation. Pancreas: Unremarkable. No pancreatic ductal dilatation or surrounding inflammatory changes. Spleen: Normal in size without focal abnormality. Adrenals/Urinary Tract: Adrenal glands are unremarkable. Kidneys are normal, without renal calculi, focal lesion, or hydronephrosis. Bladder is unremarkable. Stomach/Bowel: Stomach is within normal limits. Appendix  appears normal. No evidence of bowel wall thickening, distention, or inflammatory changes. Vascular/Lymphatic: No significant vascular findings are present. No enlarged abdominal or pelvic lymph nodes. Reproductive: Two intrauterine contraceptive devices are seen within the uterus. The adnexa are unremarkable. Other: No abdominal wall hernia or abnormality. No abdominopelvic ascites. Musculoskeletal: No acute or significant osseous findings. IMPRESSION: 1.  No findings to explain the patient's symptoms. 2.  Two intrauterine contraceptive devices are noted. Electronically Signed   By: Zerita Boers M.D.   On: 02/05/2020 10:41   US PELVIC COMPLETE W TRANSVAGINAL AND TORSION R/O  Result Date: 02/05/2020 CLINICAL DATA:  Pelvic pain EXAM: TRANSABDOMINAL AND TRANSVAGINAL ULTRASOUND OF PELVIS DOPPLER ULTRASOUND OF OVARIES TECHNIQUE: Study was performed transabdominally to optimize pelvic field of view evaluation and transvaginally to optimize internal visceral architecture evaluation. Color and duplex Doppler ultrasound was utilized to evaluate blood flow to the ovaries. COMPARISON:  April 06, 2019 FINDINGS: Uterus Measurements: 9.7 x 5.9 x 7.6 cm = volume: 228.0 mL. Uterus has a diffusely inhomogeneous echotexture consistent with fairly diffuse leiomyomatous change. A focal hypoechoic leiomyoma is noted along the superior fundus measuring 4.7 x 4.3 x 4.2 cm. A hypoechoic leiomyoma along the leftward aspect of the fundus measures 3.3 x 2.6 x 3.1 cm. A slightly hyperechoic focus consistent with leiomyoma along the rightward aspect of the uterine fundus measures 2.6 x 1.3 x 2.0 cm. Endometrium Thickness: 10 mm. Intrauterine device positioned within the endometrium. Endometrium has a smooth contour. Right ovary Measurements: 2.7 x 2.1 x 2.7 cm = volume: 8.1 mL. Normal appearance/no adnexal mass. Left ovary Measurements: 3.1 x 1.7 x  2.6 cm = volume: 7.3 mL. Normal appearance/no adnexal mass. Pulsed Doppler evaluation of  both the right ovary demonstrates normal low-resistance arterial and venous waveforms. A well-defined Doppler waveform could not be assessed in the left ovary. Other findings No abnormal free fluid. IMPRESSION: 1. The left ovary appears normal morphologically. Unable to obtain Doppler waveform in left ovary. This finding potentially could be due to body habitus. Torsion involving the left ovary cannot be entirely excluded in this circumstance. Clinical assessment in this regard advised. 2. Low resistance waveform is detectable in the right ovary. Right ovary appears normal morphologically. 3.  Leiomyomatous uterus with several leiomyomas as noted. 4.  Intrauterine device positioned within the endometrium. These results will be called to the ordering clinician or representative by the Radiologist Assistant, and communication documented in the PACS or [employer]. Electronically Signed   By: Lowella Grip III M.D.   On: 02/05/2020 14:13    Procedures Procedures (including critical care time)  Medications Ordered in ED Medications  acetaminophen (TYLENOL) tablet 650 mg (650 mg Oral Given 02/04/20 1957)  ketorolac (TORADOL) 30 MG/ML injection 30 mg (30 mg Intravenous Given 02/05/20 1226)  cefTRIAXone (ROCEPHIN) 1 g in sodium chloride 0.9 % 100 mL IVPB (0 g Intravenous Stopped 02/05/20 1424)  doxycycline (VIBRA-TABS) tablet 100 mg (100 mg Oral Given 02/05/20 1529)  metroNIDAZOLE (FLAGYL) tablet 500 mg (500 mg Oral Given 02/05/20 1529)  cephALEXin (KEFLEX) capsule 500 mg (500 mg Oral Given 02/05/20 1528)    ED Course  I have reviewed the triage vital signs and the nursing notes.  Pertinent labs & imaging results that were available during my care of the patient were reviewed by me and considered in my medical decision making (see chart for details).    MDM Rules/Calculators/A&P                          34 year old female who presents for evaluation of abdominal pain that began yesterday.  She  has had some dysuria and hematuria x2 days as well.  Reports that over the last night while in the waiting room, she started developing some chest pain.  She reports it is improved.  Initially arrival, she is afebrile nontoxic-appearing.  Vital signs are stable.  On exam, she has tenderness palpation of the right abdomen as well as right-sided CVA tenderness.  Concern for GU etiology such as UTI versus kidney stone versus pyelonephritis.  Low suspicion for appendicitis given history/physical exam.  Also consider viral GI process.  Labs ordered at triage.  Troponin negative.  I-STAT beta negative.  CMP shows normal BUN and creatinine.  UA shows moderate hemoglobin, moderate leukocytes, pyuria bacteria.  Urine culture sent.  Lipase is normal.  CBC shows no leukocytosis or anemia.  Chest x-ray negative.  Delta troponin is negative.   Given her urinary tract section, CT renal study was ordered to evaluate for possible infected kidney stone.  CT renal study shows 2 intrauterine conceptive devices.  No other acute findings.  Patient still having some pain.  She has not gotten any pain medication.  We will plan for pelvic exam.  Additionally, I saw that patient had been sent to the emergency department on 01/29/20 from urgent care.  At that home, she was having a headache, blurred vision as well as some stuttering.  She was sent to the ED for evaluation but left without being seen.  I discussed this with her.  She states she  still has some mild headache but has not had any of the stuttering, blurred vision.  We will obtain CT head to evaluate for any intracranial mass.  Patient with no neuro deficits.  History/physical exam concerning for CVA.  CT head negative for any acute abnormalities.   Pelvic exam as documented above.  Patient did have some right adnexal tenderness.  She reported some discomfort with evaluation of the cervix.  Questionable if this was CMT.  She did have large amount of discharge  noted from the cervix.  I did not visualize any IUD strings.  Given her tenderness, will plan for ultrasound evaluation.  Ultrasound reviewed.  Left ovary appears normal morphologically.  Unable to obtain Doppler waveform in left ovary.  This could be potentially due to body habitus.  Torsion cannot be entirely excluded in this circumstance.  Right ovary shows no evidence of torsion.  She has uterine fibroids noted.  Reevaluation.  Patient resting comfortably in bed.  She has no abdominal tenderness in the left side.  Clinically, her exam is not concerning for left-sided ovarian torsion.  Wet prep shows yeast, trichomoniasis, clue.  We will plan to treat.  I discussed results with patient.  Patient is allergic to sulfa antibiotics.  No other acute allergies.  I instructed her that her partner needs to be tested and treated as well.  At this time, patient's exam is improved.  She does not have any abdominal tenderness and on left side.  She is requesting an OB/GYN to follow-up with regarding her intrauterine devices.  She has not followed with OB/GYN in several years.  We will give her outpatient referral. At this time, patient exhibits no emergent life-threatening condition that require further evaluation in ED. Patient had ample opportunity for questions and discussion. All patient's questions were answered with full understanding. Strict return precautions discussed. Patient expresses understanding and agreement to plan.    Portions of this note were generated with [employer]. Dictation errors may occur despite best attempts at proofreading.  Final Clinical Impression(s) / ED Diagnoses Final diagnoses:  Right lower quadrant abdominal pain  Trichimoniasis  Bacterial vaginosis  Acute cystitis without hematuria  Uterine leiomyoma, unspecified location  Yeast vaginitis  Pelvic inflammatory disease    Rx / DC Orders ED Discharge Orders         Ordered    cephALEXin (KEFLEX) 500  MG capsule  3 times daily        02/05/20 1517    metroNIDAZOLE (FLAGYL) 500 MG tablet  2 times daily        02/05/20 1517    doxycycline (VIBRAMYCIN) 100 MG capsule  2 times daily        02/05/20 1517    fluconazole (DIFLUCAN) 200 MG tablet        02/05/20 1517           Volanda Napoleon, PA-C 02/05/20 1540    Pattricia Boss, MD 02/05/20 1807

## 2020-02-05 NOTE — ED Notes (Signed)
Off floor to US.

## 2020-02-05 NOTE — Discharge Instructions (Addendum)
As we discussed, your wet prep today was positive for trichomonas.  You also have evidence of yeast.  We will plan to treat.  Your gonorrhea and Chlamydia tests are pending.  You are being treated as part of pelvic inflammatory disease.  If you are positive, you have been treated.  Her partners need to be treated as well.  Refrain from any intercourse until you and your partner have both completed treatments.  Additionally, you have evidence of bacterial vaginitis.  You also have evidence of urinary tract infection.  Take antibiotics as directed.  Take Diflucan initially when you start your antibiotics.  When you finish all of your antibiotics, take the second dose of Diflucan.  As we discussed, 2 IUDs were seen on your CT scan today.  Please follow-up with referred OB/GYN.  Additionally, you did have evidence of uterine fibroids noted on ultrasound.  This could contribute to your pain.  I provided you with a OB/GYN follow-up.  Return the emergency department for any worsening pain, vomiting, fevers or any other worsening concerning symptoms.

## 2020-02-05 NOTE — ED Notes (Signed)
Off floor to Ultrasound.

## 2020-02-06 LAB — GC/CHLAMYDIA PROBE AMP (~~LOC~~) NOT AT ARMC
Chlamydia: NEGATIVE
Comment: NEGATIVE
Comment: NORMAL
Neisseria Gonorrhea: NEGATIVE

## 2020-02-06 LAB — URINE CULTURE

## 2020-02-12 ENCOUNTER — Ambulatory Visit: Payer: 59 | Admitting: Internal Medicine

## 2020-02-13 ENCOUNTER — Telehealth: Payer: Self-pay | Admitting: Internal Medicine

## 2020-02-13 ENCOUNTER — Other Ambulatory Visit: Payer: Self-pay

## 2020-02-13 ENCOUNTER — Emergency Department (HOSPITAL_COMMUNITY): Payer: 59

## 2020-02-13 ENCOUNTER — Emergency Department (HOSPITAL_COMMUNITY)
Admission: EM | Admit: 2020-02-13 | Discharge: 2020-02-14 | Disposition: A | Discharge status: Home / Self Care | Payer: 59 | Attending: Emergency Medicine | Admitting: Emergency Medicine

## 2020-02-13 DIAGNOSIS — R079 Chest pain, unspecified: Secondary | ICD-10-CM | POA: Diagnosis present

## 2020-02-13 DIAGNOSIS — Z79899 Other long term (current) drug therapy: Secondary | ICD-10-CM | POA: Insufficient documentation

## 2020-02-13 DIAGNOSIS — Z7982 Long term (current) use of aspirin: Secondary | ICD-10-CM | POA: Insufficient documentation

## 2020-02-13 DIAGNOSIS — I471 Supraventricular tachycardia: Secondary | ICD-10-CM | POA: Diagnosis not present

## 2020-02-13 DIAGNOSIS — Z7901 Long term (current) use of anticoagulants: Secondary | ICD-10-CM | POA: Insufficient documentation

## 2020-02-13 DIAGNOSIS — I10 Essential (primary) hypertension: Secondary | ICD-10-CM | POA: Diagnosis not present

## 2020-02-13 DIAGNOSIS — Z87891 Personal history of nicotine dependence: Secondary | ICD-10-CM | POA: Insufficient documentation

## 2020-02-13 LAB — BASIC METABOLIC PANEL
Anion gap: 9 (ref 5–15)
BUN: 12 mg/dL (ref 6–20)
CO2: 22 mmol/L (ref 22–32)
Calcium: 8.9 mg/dL (ref 8.9–10.3)
Chloride: 107 mmol/L (ref 98–111)
Creatinine, Ser: 0.82 mg/dL (ref 0.44–1.00)
GFR, Estimated: >60 mL/min (ref >60)
Glucose, Bld: 88 mg/dL (ref 70–99)
Potassium: 3.8 mmol/L (ref 3.5–5.1)
Sodium: 138 mmol/L (ref 135–145)

## 2020-02-13 LAB — CBC
HCT: 40.3 % (ref 36.0–46.0)
Hemoglobin: 12.3 g/dL (ref 12.0–15.0)
MCH: 26.3 pg (ref 26.0–34.0)
MCHC: 30.5 g/dL (ref 30.0–36.0)
MCV: 86.3 fL (ref 80.0–100.0)
Platelets: 320 10*3/uL (ref 150–400)
RBC: 4.67 MIL/uL (ref 3.87–5.11)
RDW: 14.9 % (ref 11.5–15.5)
WBC: 6.5 10*3/uL (ref 4.0–10.5)
nRBC: 0 % (ref 0.0–0.2)

## 2020-02-13 LAB — I-STAT BETA HCG BLOOD, ED (MC, WL, AP ONLY): I-stat hCG, quantitative: <5 m[IU]/mL (ref <5)

## 2020-02-13 LAB — TROPONIN I (HIGH SENSITIVITY)
Troponin I (High Sensitivity): 6 ng/L (ref <18)
Troponin I (High Sensitivity): 4 ng/L (ref <18)

## 2020-02-13 MED ORDER — DILTIAZEM HCL 25 MG/5ML IV SOLN
15.0000 mg | Freq: Once | INTRAVENOUS | Status: AC
  Administered 2020-02-13: 15 mg via INTRAVENOUS
  Filled 2020-02-13: qty 5

## 2020-02-13 NOTE — ED Provider Notes (Signed)
[facility] EMERGENCY DEPARTMENT Provider Note   CSN: [id number] Arrival date & time: 02/13/20  1814     History Chief Complaint  Patient presents with  . Chest Pain    Jessica Sanchez is a 34 y.o. female.  Patient is a 34 year old female who has a history of SVT who presents with shortness of breath and palpitations.  She says her symptoms started about 1230 this afternoon.  She is having some tightness across her chest associated with shortness of breath and palpitations.  She has a known history of SVT and has had similar symptoms in the past.  She has had an ablation but it was not successful and she has had several episodes of SVT since that time.  She is currently on Cardizem and flecainide.  She took an extra dose of her flecainide today but it has not reduced her heart rate.  She was recently treated for UTI but says her symptoms have been getting better.  She does not report any other recent illnesses.        Past Medical History:  Diagnosis Date  . Fibroid (bleeding) (uterine)   . Fibroid, uterine   . H/O metrorrhagia   . Hypertension   . IUD (intrauterine device) in place 07/11/2015  . Menorrhagia   . Obesity, Class III, BMI 40-49.9 (morbid obesity) ([place]) 11/11/2018  . SVT (supraventricular tachycardia) [facility])     Patient Active Problem List   Diagnosis Date Noted  . Elevated troponin   . Attempted IUD removal, unsuccessful 07/05/2019  . Encounter for IUD insertion 07/05/2019  . Gastroesophageal reflux disease   . Nonspecific chest pain 11/11/2018  . Morbid obesity with body mass index (BMI) of 60.0 to 69.9 in adult ([place]) 11/11/2018  . Elevated d-dimer 11/11/2018  . SVT (supraventricular tachycardia) ([place]) 10/04/2018  . IUD (intrauterine device) in place 07/11/2015  . Other disorder of menstruation and other abnormal bleeding from female genital tract 10/30/2012    Past Surgical History:  Procedure Laterality Date  . HYSTEROSCOPY WITH D & C  N/A 09/27/2013   Procedure: DILATATION AND CURETTAGE /HYSTEROSCOPY and insertion of Mirena IUD ;  Surgeon: [name]. Harrington Challenger, MD;  Location: [facility];  Service: Gynecology;  Laterality: N/A;  . MYOMECTOMY  02/03/2011   Procedure: MYOMECTOMY;  Surgeon: [name], MD;  Location: [facility];  Service: Gynecology;  Laterality: N/A;  . SVT ABLATION N/A 11/08/2018   Procedure: SVT ABLATION;  Surgeon: [name], MD;  Location: [facility];  Service: Cardiovascular;  Laterality: N/A;  . TONSILLECTOMY       OB History    Gravida  1   Para      Term      Preterm      AB  1   Living        SAB  1   TAB      Ectopic      Multiple      Live Births              Family History  Problem Relation Age of Onset  . Cirrhosis Mother   . Diabetes Mother   . Cancer Maternal Grandfather   . Hypertension Sister   . Diabetes Sister   . Anesthesia problems Neg Hx   . Hypotension Neg Hx   . Malignant hyperthermia Neg Hx   . Pseudochol deficiency Neg Hx     Social History   Tobacco Use  .  Smoking status: Former Research scientist (life sciences)  . Smokeless tobacco: Never Used  Vaping Use  . Vaping Use: Never used  Substance Use Topics  . Alcohol use: Yes    Comment: occasional  . Drug use: No    Home Medications Prior to Admission medications   Medication Sig Start Date End Date Taking? Authorizing Provider  acetaminophen (TYLENOL) 325 MG tablet Take 650 mg by mouth every 6 (six) hours as needed for headache.   Yes [provider]  albuterol (PROVENTIL HFA) 108 (90 Base) MCG/ACT inhaler Inhale 1-2 puffs into the lungs every 6 (six) hours as needed for wheezing or shortness of breath.    Yes [provider]  aspirin 325 MG tablet Take 325 mg by mouth daily as needed for mild pain.   Yes [provider]  cephALEXin (KEFLEX) 500 MG capsule Take 500 mg by mouth 3 (three) times daily.   Yes [provider]  doxycycline (VIBRAMYCIN) 100 MG capsule Take 1 capsule (100  mg total) by mouth 2 (two) times daily for 14 days. 02/05/20 02/19/20 Yes Volanda Napoleon, PA-C  flecainide (TAMBOCOR) 150 MG tablet Take 0.5 tablets (75 mg total) by mouth 2 (two) times daily. 10/30/19  Yes [name], MD  fluconazole (DIFLUCAN) 200 MG tablet Take 1 tablet by mouth today.  Take the next tablet by mouth whenever you finish your antibiotics. Patient taking differently: Take 200 mg by mouth See admin instructions. Take 1 tablet (200 mg totally) by mouth on 02/05/2020. Take the next tablet by mouth whenever finish antibiotics. 02/05/20  Yes Volanda Napoleon, PA-C  levonorgestrel (MIRENA) 20 MCG/24HR IUD 1 each by Intrauterine route once.    Yes [provider]  metroNIDAZOLE (FLAGYL) 500 MG tablet Take 1 tablet (500 mg total) by mouth 2 (two) times daily for 14 days. 02/05/20 02/19/20 Yes Providence Lanius A, PA-C  Multiple Vitamins-Calcium (ONE-A-DAY WOMENS PO) Take 1 tablet by mouth daily.   Yes [provider]  diltiazem (CARDIZEM CD) 240 MG 24 hr capsule Take 1 capsule (240 mg total) by mouth daily. Patient not taking: Reported on 02/05/2020 06/14/19 06/08/20  Erma Heritage, PA-C  HYDROcodone-acetaminophen (NORCO/VICODIN) 5-325 MG tablet Take 1-2 tablets by mouth every 6 (six) hours as needed for moderate pain or severe pain. Patient not taking: Reported on 02/13/2020 11/28/19   Charlesetta Shanks, MD  apixaban (ELIQUIS) 5 MG TABS tablet Take 2 tablets (10mg ) twice daily for 7 days, then 1 tablet (5mg ) twice daily 04/20/19 04/20/19  Rodell Perna A, PA-C    Allergies    Shellfish allergy, Sulfa antibiotics, and Adhesive [tape]  Review of Systems   Review of Systems  Constitutional: Positive for fatigue. Negative for chills, diaphoresis and fever.  HENT: Negative for congestion, rhinorrhea and sneezing.   Eyes: Negative.   Respiratory: Positive for chest tightness and shortness of breath. Negative for cough.   Cardiovascular: Negative for chest pain and leg  swelling.  Gastrointestinal: Negative for abdominal pain, blood in stool, diarrhea, nausea and vomiting.  Genitourinary: Negative for difficulty urinating, flank pain, frequency and hematuria.  Musculoskeletal: Negative for arthralgias and back pain.  Skin: Negative for rash.  Neurological: Positive for light-headedness. Negative for dizziness, speech difficulty, weakness, numbness and headaches.    Physical Exam Updated Vital Signs BP (!) 114/52 (BP Location: Right Wrist)   Pulse (!) 101   Temp 98.3 F (36.8 C) (Oral)   Resp (!) 28   Ht 5\' 2"  (1.575 m)   Wt Marland Kitchen)  147 kg   SpO2 96%   BMI 59.26 kg/m   Physical Exam Constitutional:      Appearance: She is well-developed. She is obese.  HENT:     Head: Normocephalic and atraumatic.  Eyes:     Pupils: Pupils are equal, round, and reactive to light.  Cardiovascular:     Rate and Rhythm: Regular rhythm. Tachycardia present.     Heart sounds: Normal heart sounds.  Pulmonary:     Effort: Pulmonary effort is normal. No respiratory distress.     Breath sounds: Normal breath sounds. No wheezing or rales.  Chest:     Chest wall: No tenderness.  Abdominal:     General: Bowel sounds are normal.     Palpations: Abdomen is soft.     Tenderness: There is no abdominal tenderness. There is no guarding or rebound.  Musculoskeletal:        General: Normal range of motion.     Cervical back: Normal range of motion and neck supple.  Lymphadenopathy:     Cervical: No cervical adenopathy.  Skin:    General: Skin is warm and dry.     Findings: No rash.  Neurological:     Mental Status: She is alert and oriented to person, place, and time.     ED Results / Procedures / Treatments   Labs (all labs ordered are listed, but only abnormal results are displayed) Labs Reviewed  BASIC METABOLIC PANEL  CBC  I-STAT BETA HCG BLOOD, ED ([facility], [facility], [facility] ONLY)  TROPONIN I (HIGH SENSITIVITY)  TROPONIN I (HIGH SENSITIVITY)    EKG EKG  Interpretation  Date/Time:  Wednesday February 13 2020 18:32:56 EDT Ventricular Rate:  140 PR Interval:    QRS Duration: 78 QT Interval:  320 QTC Calculation: 488 R Axis:   13 Text Interpretation: undetermined rhythm.  atrial flutter vs sinus tachycardia Abnormal ECG Confirmed by Malvin Johns [phone]) on 02/13/2020 6:54:02 PM   Radiology DG Chest 2 View  Result Date: 02/13/2020 CLINICAL DATA:  Chest pain EXAM: CHEST - 2 VIEW COMPARISON:  None. FINDINGS: Lung volumes are small and there is mild left basilar atelectasis. No pneumothorax or pleural effusion. Cardiac size within normal limits. There is pulmonary vascular crowding at the hila secondary to pulmonary hypoinflation. No superimposed pulmonary edema. No acute bone abnormality. IMPRESSION: Pulmonary hypoinflation with left basilar atelectasis. Electronically Signed   By: Fidela Salisbury MD   On: 02/13/2020 19:04    Procedures Procedures (including critical care time)  Medications Ordered in ED Medications  diltiazem (CARDIZEM) injection 15 mg (15 mg Intravenous Given 02/13/20 2106)    ED Course  I have reviewed the triage vital signs and the nursing notes.  Pertinent labs & imaging results that were available during my care of the patient were reviewed by me and considered in my medical decision making (see chart for details).    MDM Rules/Calculators/A&P                          Patient is a 34 year old female who presents with palpitations.  Her EKG reveals SVT.  She has had similar presentations in the past.  On chart review, on her last 3 visits for SVT, she has not responded to adenosine and was able to convert with Cardizem.  She was given Cardizem 15 mg IV.  She did convert to a sinus rhythm at this point.  She initially was slightly tachycardic in the low 100s  but now is more in the 90s.  Her symptoms have improved.  Her labs are nonconcerning.  She has had 2 - troponins.  She has no ischemic changes on EKG.  Chest  x-ray shows no evidence of fluid overload or pneumonia.  She was discharged home in good condition.  She was advised to follow-up with her cardiologist.  Return precautions were given.  CRITICAL CARE Performed by: Malvin Johns Total critical care time: 60 minutes Critical care time was exclusive of separately billable procedures and treating other patients. Critical care was necessary to treat or prevent imminent or life-threatening deterioration. Critical care was time spent personally by me on the following activities: development of treatment plan with patient and/or surrogate as well as nursing, discussions with consultants, evaluation of patient's response to treatment, examination of patient, obtaining history from patient or surrogate, ordering and performing treatments and interventions, ordering and review of laboratory studies, ordering and review of radiographic studies, pulse oximetry and re-evaluation of patient's condition.  Final Clinical Impression(s) / ED Diagnoses Final diagnoses:  SVT (supraventricular tachycardia) [facility])    Rx / DC Orders ED Discharge Orders    None       Malvin Johns, MD 02/13/20 2243

## 2020-02-13 NOTE — Telephone Encounter (Signed)
Attempted call back to pt.  Left voicemail message to contact RN at [phone] or if experiencing emergent symptoms please call 911 or have someone drive you (pt) to the closest  ED.

## 2020-02-13 NOTE — ED Triage Notes (Signed)
Pt to ED for eval of twisting central chest pain and palpitations with associated shortness of breath and dizziness since 1300 today.

## 2020-02-13 NOTE — Telephone Encounter (Signed)
Patient returning call. She states she feels shaky and is on her way to the emergency room. She states she has someone who will drive her.

## 2020-02-13 NOTE — ED Notes (Signed)
Pt is currently in x-ray. Transport will bring pt to room when finished.

## 2020-02-13 NOTE — Telephone Encounter (Signed)
STAT if HR is under 50 or over 120 (normal HR is 60-100 beats per minute)  1) What is your heart rate? 150  2) Do you have a log of your heart rate readings (document readings)? Not right now  3) Do you have any other symptoms? Dizziness and headaches   STAT if patient feels like he/she is going to faint   1) Are you dizzy now? yes  2) Do you feel faint or have you passed out? Feels like she may, she is sitting down right now  3) Do you have any other symptoms? Headache for the past 2 days  4) Have you checked your HR and BP (record if available)? HR 150 and does not have a BP machine.    Patient hung up while on hold to call triage, LVM on patient's machine advising a note had been sent to our triage nurses and one of them would be reaching out to her.

## 2020-02-13 NOTE — Telephone Encounter (Signed)
Spoke with pt who states current HR 141.  Pt asks if RN can call pt back in 5 minutes as pt is at work and is trying to leave.  Pt advised not to drive self if she does leave.  RN will attempt to make contact with pt in about 5 minutes.

## 2020-02-13 NOTE — Telephone Encounter (Signed)
      I went in pt's chart to see who had called her today. it was Jessica Sanchez

## 2020-03-18 ENCOUNTER — Ambulatory Visit: Payer: 59 | Admitting: Internal Medicine

## 2020-04-13 ENCOUNTER — Other Ambulatory Visit: Payer: Self-pay

## 2020-04-13 ENCOUNTER — Emergency Department (HOSPITAL_COMMUNITY)
Admission: EM | Admit: 2020-04-13 | Discharge: 2020-04-14 | Disposition: A | Discharge status: Home / Self Care | Payer: 59 | Attending: Emergency Medicine | Admitting: Emergency Medicine

## 2020-04-13 ENCOUNTER — Encounter (HOSPITAL_COMMUNITY): Payer: Self-pay | Admitting: Emergency Medicine

## 2020-04-13 DIAGNOSIS — R110 Nausea: Secondary | ICD-10-CM | POA: Insufficient documentation

## 2020-04-13 DIAGNOSIS — N3001 Acute cystitis with hematuria: Secondary | ICD-10-CM | POA: Insufficient documentation

## 2020-04-13 DIAGNOSIS — R1010 Upper abdominal pain, unspecified: Secondary | ICD-10-CM | POA: Diagnosis present

## 2020-04-13 LAB — COMPREHENSIVE METABOLIC PANEL
ALT: 14 U/L (ref 0–44)
AST: 14 U/L — ABNORMAL LOW (ref 15–41)
Albumin: 3.4 g/dL — ABNORMAL LOW (ref 3.5–5.0)
Alkaline Phosphatase: 48 U/L (ref 38–126)
Anion gap: 9 (ref 5–15)
BUN: 9 mg/dL (ref 6–20)
CO2: 24 mmol/L (ref 22–32)
Calcium: 9.1 mg/dL (ref 8.9–10.3)
Chloride: 103 mmol/L (ref 98–111)
Creatinine, Ser: 0.68 mg/dL (ref 0.44–1.00)
GFR, Estimated: >60 mL/min (ref >60)
Glucose, Bld: 122 mg/dL — ABNORMAL HIGH (ref 70–99)
Potassium: 4 mmol/L (ref 3.5–5.1)
Sodium: 136 mmol/L (ref 135–145)
Total Bilirubin: 0.6 mg/dL (ref 0.3–1.2)
Total Protein: 6.9 g/dL (ref 6.5–8.1)

## 2020-04-13 LAB — CBC
HCT: 38.7 % (ref 36.0–46.0)
Hemoglobin: 12.3 g/dL (ref 12.0–15.0)
MCH: 27.4 pg (ref 26.0–34.0)
MCHC: 31.8 g/dL (ref 30.0–36.0)
MCV: 86.2 fL (ref 80.0–100.0)
Platelets: 295 10*3/uL (ref 150–400)
RBC: 4.49 MIL/uL (ref 3.87–5.11)
RDW: 14.9 % (ref 11.5–15.5)
WBC: 5.9 10*3/uL (ref 4.0–10.5)
nRBC: 0 % (ref 0.0–0.2)

## 2020-04-13 LAB — URINALYSIS, ROUTINE W REFLEX MICROSCOPIC
Bilirubin Urine: NEGATIVE
Glucose, UA: NEGATIVE mg/dL
Ketones, ur: NEGATIVE mg/dL
Nitrite: NEGATIVE
Protein, ur: NEGATIVE mg/dL
Specific Gravity, Urine: 1.011 (ref 1.005–1.030)
pH: 7 (ref 5.0–8.0)

## 2020-04-13 LAB — I-STAT BETA HCG BLOOD, ED (MC, WL, AP ONLY): I-stat hCG, quantitative: <5 m[IU]/mL (ref <5)

## 2020-04-13 LAB — LIPASE, BLOOD: Lipase: 22 U/L (ref 11–51)

## 2020-04-13 NOTE — ED Triage Notes (Signed)
Pt reports upper abd pain since last night that radiates to L flank.  Also reports nausea and hematuria.

## 2020-04-13 NOTE — ED Notes (Signed)
Pt states she can not wait any longer. Pt leaving

## 2020-04-14 ENCOUNTER — Encounter (HOSPITAL_COMMUNITY): Payer: Self-pay

## 2020-04-14 ENCOUNTER — Other Ambulatory Visit: Payer: Self-pay

## 2020-04-14 ENCOUNTER — Emergency Department (HOSPITAL_COMMUNITY)
Admission: EM | Admit: 2020-04-14 | Discharge: 2020-04-15 | Disposition: A | Discharge status: Home / Self Care | Payer: 59 | Source: Home / Self Care | Attending: Emergency Medicine | Admitting: Emergency Medicine

## 2020-04-14 ENCOUNTER — Ambulatory Visit (HOSPITAL_COMMUNITY)
Admission: EM | Admit: 2020-04-14 | Discharge: 2020-04-14 | Disposition: A | Discharge status: Home / Self Care | Payer: 59 | Attending: Family Medicine | Admitting: Family Medicine

## 2020-04-14 DIAGNOSIS — R109 Unspecified abdominal pain: Secondary | ICD-10-CM

## 2020-04-14 DIAGNOSIS — Z87891 Personal history of nicotine dependence: Secondary | ICD-10-CM | POA: Insufficient documentation

## 2020-04-14 DIAGNOSIS — R1012 Left upper quadrant pain: Secondary | ICD-10-CM

## 2020-04-14 DIAGNOSIS — Z3202 Encounter for pregnancy test, result negative: Secondary | ICD-10-CM | POA: Diagnosis not present

## 2020-04-14 DIAGNOSIS — I10 Essential (primary) hypertension: Secondary | ICD-10-CM | POA: Insufficient documentation

## 2020-04-14 DIAGNOSIS — N3001 Acute cystitis with hematuria: Secondary | ICD-10-CM

## 2020-04-14 DIAGNOSIS — R1032 Left lower quadrant pain: Secondary | ICD-10-CM | POA: Diagnosis not present

## 2020-04-14 LAB — COMPREHENSIVE METABOLIC PANEL
ALT: 15 U/L (ref 0–44)
AST: 14 U/L — ABNORMAL LOW (ref 15–41)
Albumin: 3.5 g/dL (ref 3.5–5.0)
Alkaline Phosphatase: 55 U/L (ref 38–126)
Anion gap: 10 (ref 5–15)
BUN: 6 mg/dL (ref 6–20)
CO2: 24 mmol/L (ref 22–32)
Calcium: 9.1 mg/dL (ref 8.9–10.3)
Chloride: 103 mmol/L (ref 98–111)
Creatinine, Ser: 0.61 mg/dL (ref 0.44–1.00)
GFR, Estimated: >60 mL/min (ref >60)
Glucose, Bld: 77 mg/dL (ref 70–99)
Potassium: 4.2 mmol/L (ref 3.5–5.1)
Sodium: 137 mmol/L (ref 135–145)
Total Bilirubin: 0.6 mg/dL (ref 0.3–1.2)
Total Protein: 7.1 g/dL (ref 6.5–8.1)

## 2020-04-14 LAB — CBC
HCT: 40.4 % (ref 36.0–46.0)
Hemoglobin: 12.4 g/dL (ref 12.0–15.0)
MCH: 26.6 pg (ref 26.0–34.0)
MCHC: 30.7 g/dL (ref 30.0–36.0)
MCV: 86.7 fL (ref 80.0–100.0)
Platelets: 293 10*3/uL (ref 150–400)
RBC: 4.66 MIL/uL (ref 3.87–5.11)
RDW: 14.7 % (ref 11.5–15.5)
WBC: 4.6 10*3/uL (ref 4.0–10.5)
nRBC: 0 % (ref 0.0–0.2)

## 2020-04-14 LAB — URINALYSIS, ROUTINE W REFLEX MICROSCOPIC
Bilirubin Urine: NEGATIVE
Glucose, UA: NEGATIVE mg/dL
Ketones, ur: NEGATIVE mg/dL
Nitrite: NEGATIVE
Protein, ur: NEGATIVE mg/dL
Specific Gravity, Urine: 1.019 (ref 1.005–1.030)
pH: 5 (ref 5.0–8.0)

## 2020-04-14 LAB — POCT URINALYSIS DIPSTICK, ED / UC
Bilirubin Urine: NEGATIVE
Glucose, UA: NEGATIVE mg/dL
Ketones, ur: NEGATIVE mg/dL
Nitrite: NEGATIVE
Protein, ur: NEGATIVE mg/dL
Specific Gravity, Urine: >=1.03 (ref 1.005–1.030)
Urobilinogen, UA: 1 mg/dL (ref 0.0–1.0)
pH: 6 (ref 5.0–8.0)

## 2020-04-14 LAB — LIPASE, BLOOD: Lipase: 19 U/L (ref 11–51)

## 2020-04-14 LAB — I-STAT BETA HCG BLOOD, ED (MC, WL, AP ONLY): I-stat hCG, quantitative: <5 m[IU]/mL (ref <5)

## 2020-04-14 LAB — POC URINE PREG, ED: Preg Test, Ur: NEGATIVE

## 2020-04-14 NOTE — ED Triage Notes (Signed)
Pt from UC with L sided abdominal pain x 3 days. Endorses nausea without vomiting, headache, and blood in stool and urine. Pt also reports that she has two IUDs because when she got her new one, the GYN sts she did not see it, but on a pelvic exam in the ED two were seen. Would like them both removed.

## 2020-04-14 NOTE — ED Provider Notes (Signed)
[facility]    CSN: [id number] Arrival date & time: 04/14/20  1210      History   Chief Complaint Chief Complaint  Patient presents with  . Hematuria  . Abdominal Pain  . Back Pain    HPI Jessica Sanchez is a 34 y.o. female.   Patient presents with pain in her left upper and lower abdomen today.  She also reports left flank pain, hematuria, urinary frequency.  She denies fever, chills, vomiting, diarrhea, vaginal discharge, pelvic pain, or other symptoms.  Her medical history includes uterine fibroid, menorrhagia, hypertension, SVT, obesity.  The history is provided by the patient and medical records.    Past Medical History:  Diagnosis Date  . Fibroid (bleeding) (uterine)   . Fibroid, uterine   . H/O metrorrhagia   . Hypertension   . IUD (intrauterine device) in place 07/11/2015  . Menorrhagia   . Obesity, Class III, BMI 40-49.9 (morbid obesity) ([place]) 11/11/2018  . SVT (supraventricular tachycardia) [facility])     Patient Active Problem List   Diagnosis Date Noted  . Elevated troponin   . Attempted IUD removal, unsuccessful 07/05/2019  . Encounter for IUD insertion 07/05/2019  . Gastroesophageal reflux disease   . Nonspecific chest pain 11/11/2018  . Morbid obesity with body mass index (BMI) of 60.0 to 69.9 in adult ([place]) 11/11/2018  . Elevated d-dimer 11/11/2018  . SVT (supraventricular tachycardia) ([place]) 10/04/2018  . IUD (intrauterine device) in place 07/11/2015  . Other disorder of menstruation and other abnormal bleeding from female genital tract 10/30/2012    Past Surgical History:  Procedure Laterality Date  . HYSTEROSCOPY WITH D & C N/A 09/27/2013   Procedure: DILATATION AND CURETTAGE /HYSTEROSCOPY and insertion of Mirena IUD ;  Surgeon: [name]. Harrington Challenger, MD;  Location: [facility];  Service: Gynecology;  Laterality: N/A;  . MYOMECTOMY  02/03/2011   Procedure: MYOMECTOMY;  Surgeon: [name], MD;  Location: [facility];  Service: Gynecology;  Laterality: N/A;   . SVT ABLATION N/A 11/08/2018   Procedure: SVT ABLATION;  Surgeon: [name], MD;  Location: [place];  Service: Cardiovascular;  Laterality: N/A;  . TONSILLECTOMY      OB History    Gravida  1   Para      Term      Preterm      AB  1   Living        SAB  1   IAB      Ectopic      Multiple      Live Births               Home Medications    Prior to Admission medications   Medication Sig Start Date End Date Taking? Authorizing Provider  flecainide (TAMBOCOR) 150 MG tablet Take 0.5 tablets (75 mg total) by mouth 2 (two) times daily. 10/30/19  Yes [name], MD  acetaminophen (TYLENOL) 325 MG tablet Take 650 mg by mouth every 6 (six) hours as needed for headache.    [provider]  albuterol (PROVENTIL HFA) 108 (90 Base) MCG/ACT inhaler Inhale 1-2 puffs into the lungs every 6 (six) hours as needed for wheezing or shortness of breath.     [provider]  aspirin 325 MG tablet Take 325 mg by mouth daily as needed for mild pain.    [provider]  cephALEXin (KEFLEX) 500 MG capsule Take 500 mg by mouth 3 (three) times daily.  [provider]  diltiazem (CARDIZEM CD) 240 MG 24 hr capsule Take 1 capsule (240 mg total) by mouth daily. Patient not taking: Reported on 02/05/2020 06/14/19 06/08/20  Erma Heritage, PA-C  fluconazole (DIFLUCAN) 200 MG tablet Take 1 tablet by mouth today.  Take the next tablet by mouth whenever you finish your antibiotics. Patient taking differently: Take 200 mg by mouth See admin instructions. Take 1 tablet (200 mg totally) by mouth on 02/05/2020. Take the next tablet by mouth whenever finish antibiotics. 02/05/20   Volanda Napoleon, PA-C  HYDROcodone-acetaminophen (NORCO/VICODIN) 5-325 MG tablet Take 1-2 tablets by mouth every 6 (six) hours as needed for moderate pain or severe pain. Patient not taking: Reported on 02/13/2020 11/28/19   Charlesetta Shanks, MD  levonorgestrel (MIRENA) 20  MCG/24HR IUD 1 each by Intrauterine route once.     [provider]  Multiple Vitamins-Calcium (ONE-A-DAY WOMENS PO) Take 1 tablet by mouth daily.    [provider]  apixaban (ELIQUIS) 5 MG TABS tablet Take 2 tablets (10mg ) twice daily for 7 days, then 1 tablet (5mg ) twice daily 04/20/19 04/20/19  Renita Papa, PA-C    Family History Family History  Problem Relation Age of Onset  . Cirrhosis Mother   . Diabetes Mother   . Cancer Maternal Grandfather   . Hypertension Sister   . Diabetes Sister   . Anesthesia problems Neg Hx   . Hypotension Neg Hx   . Malignant hyperthermia Neg Hx   . Pseudochol deficiency Neg Hx     Social History Social History   Tobacco Use  . Smoking status: Former Research scientist (life sciences)  . Smokeless tobacco: Never Used  Vaping Use  . Vaping Use: Never used  Substance Use Topics  . Alcohol use: Yes    Comment: occasional  . Drug use: No     Allergies   Shellfish allergy, Sulfa antibiotics, and Adhesive [tape]   Review of Systems Review of Systems  Constitutional: Negative for chills and fever.  HENT: Negative for ear pain and sore throat.   Eyes: Negative for pain and visual disturbance.  Respiratory: Negative for cough and shortness of breath.   Cardiovascular: Negative for chest pain and palpitations.  Gastrointestinal: Positive for abdominal pain. Negative for constipation, diarrhea, nausea and vomiting.  Genitourinary: Positive for flank pain, frequency and hematuria. Negative for dysuria, pelvic pain and vaginal discharge.  Musculoskeletal: Negative for arthralgias and back pain.  Skin: Negative for color change and rash.  Neurological: Negative for seizures and syncope.  All other systems reviewed and are negative.    Physical Exam Triage Vital Signs ED Triage Vitals  Enc Vitals Group     BP 04/14/20 1335 134/90     Pulse Rate 04/14/20 1335 81     Resp 04/14/20 1335 20     Temp 04/14/20 1335 98.1 F (36.7 C)     Temp Source  04/14/20 1335 Oral     SpO2 04/14/20 1335 100 %     Weight --      Height --      Head Circumference --      Peak Flow --      Pain Score 04/14/20 1332 10     Pain Loc --      Pain Edu? --      Excl. in [place]? --    No data found.  Updated Vital Signs BP 134/90 (BP Location: Left Arm)   Pulse 81   Temp 98.1 F (36.7 C) (  Oral)   Resp 20   SpO2 100%   Visual Acuity Right Eye Distance:   Left Eye Distance:   Bilateral Distance:    Right Eye Near:   Left Eye Near:    Bilateral Near:     Physical Exam Vitals and nursing note reviewed.  Constitutional:      General: She is not in acute distress.    Appearance: She is well-developed and well-nourished. She is obese.  HENT:     Head: Normocephalic and atraumatic.     Mouth/Throat:     Mouth: Mucous membranes are moist.  Eyes:     Conjunctiva/sclera: Conjunctivae normal.  Cardiovascular:     Rate and Rhythm: Normal rate and regular rhythm.     Heart sounds: Normal heart sounds.  Pulmonary:     Effort: Pulmonary effort is normal. No respiratory distress.     Breath sounds: Normal breath sounds.  Abdominal:     General: Bowel sounds are normal.     Palpations: Abdomen is soft.     Tenderness: There is abdominal tenderness in the left upper quadrant and left lower quadrant. There is left CVA tenderness. There is no right CVA tenderness, guarding or rebound.  Musculoskeletal:        General: No edema.     Cervical back: Neck supple.  Skin:    General: Skin is warm and dry.     Findings: No rash.  Neurological:     General: No focal deficit present.     Mental Status: She is alert and oriented to person, place, and time.     Gait: Gait normal.  Psychiatric:        Mood and Affect: Mood and affect and mood normal.        Behavior: Behavior normal.      UC Treatments / Results  Labs (all labs ordered are listed, but only abnormal results are displayed) Labs Reviewed  POCT URINALYSIS DIPSTICK, ED / UC - Abnormal;  Notable for the following components:      Result Value   Hgb urine dipstick MODERATE (*)    Leukocytes,Ua SMALL (*)    All other components within normal limits  POC URINE PREG, ED    EKG   Radiology No results found.  Procedures Procedures (including critical care time)  Medications Ordered in UC Medications - No data to display  Initial Impression / Assessment and Plan / UC Course  I have reviewed the triage vital signs and the nursing notes.  Pertinent labs & imaging results that were available during my care of the patient were reviewed by me and considered in my medical decision making (see chart for details).   Left upper quadrant abdominal pain, left lower quadrant abdominal pain, left flank pain.  Urine pregnancy negative.  Sending patient to the ED for evaluation of her acute abdominal pain.  She agrees to plan of care.    Final Clinical Impressions(s) / UC Diagnoses   Final diagnoses:  Left upper quadrant abdominal pain  Left lower quadrant abdominal pain  Left flank pain     Discharge Instructions     Go to the emergency department for evaluation of your acute abdominal pain.    ED Prescriptions    None     PDMP not reviewed this encounter.   Sharion Balloon, NP 04/14/20 1404

## 2020-04-14 NOTE — ED Triage Notes (Signed)
Pt in with c/o epigastric pain that radiates to her back. states it's mainly on left side. Also c/o hematuria and urinary frequency  Denies fever, vaginal burning

## 2020-04-14 NOTE — Discharge Instructions (Addendum)
Go to the emergency department for evaluation of your acute abdominal pain. 

## 2020-04-15 ENCOUNTER — Emergency Department (HOSPITAL_COMMUNITY): Payer: 59

## 2020-04-15 MED ORDER — MORPHINE SULFATE (PF) 4 MG/ML IV SOLN
4.0000 mg | Freq: Once | INTRAVENOUS | Status: AC
  Administered 2020-04-15: 4 mg via INTRAVENOUS
  Filled 2020-04-15: qty 1

## 2020-04-15 MED ORDER — CEPHALEXIN 250 MG PO CAPS
500.0000 mg | ORAL_CAPSULE | Freq: Once | ORAL | Status: AC
  Administered 2020-04-15: 500 mg via ORAL
  Filled 2020-04-15: qty 2

## 2020-04-15 MED ORDER — ONDANSETRON HCL 4 MG/2ML IJ SOLN
4.0000 mg | Freq: Once | INTRAMUSCULAR | Status: AC
  Administered 2020-04-15: 4 mg via INTRAVENOUS
  Filled 2020-04-15: qty 2

## 2020-04-15 MED ORDER — CEPHALEXIN 500 MG PO CAPS: 500.0000 mg | ORAL_CAPSULE | Freq: Three times a day (TID) | ORAL | 0 refills | Status: DC

## 2020-04-15 NOTE — Discharge Instructions (Addendum)
You were seen today for abdominal pain and urinary symptoms.  He has evidence of a urinary tract infection.  Take medications as prescribed.  Follow-up with your primary physician as needed.

## 2020-04-15 NOTE — ED Provider Notes (Signed)
[facility] EMERGENCY DEPARTMENT Provider Note   CSN: [id number] Arrival date & time: 04/14/20  1409     History Chief Complaint  Patient presents with  . Abdominal Pain    Jessica Sanchez is a 34 y.o. female.  HPI     This is a 34 year old female with a history of hypertension, menorrhagia, obesity who presents with abdominal pain.  Patient reports 2-day history of left-sided abdominal and back pain.  Acute in onset.  Has been constant nature.  Nothing seems to make it better or worse.  She reports some urinary urgency and hematuria.  No known history of kidney stones.  Denies fevers.  Reports nausea without vomiting.  No diarrhea or constipation.  Currently she rates her pain at 10 out of 10.  She has not taken anything for her pain.  Past Medical History:  Diagnosis Date  . Fibroid (bleeding) (uterine)   . Fibroid, uterine   . H/O metrorrhagia   . Hypertension   . IUD (intrauterine device) in place 07/11/2015  . Menorrhagia   . Obesity, Class III, BMI 40-49.9 (morbid obesity) ([place]) 11/11/2018  . SVT (supraventricular tachycardia) [facility])     Patient Active Problem List   Diagnosis Date Noted  . Elevated troponin   . Attempted IUD removal, unsuccessful 07/05/2019  . Encounter for IUD insertion 07/05/2019  . Gastroesophageal reflux disease   . Nonspecific chest pain 11/11/2018  . Morbid obesity with body mass index (BMI) of 60.0 to 69.9 in adult ([place]) 11/11/2018  . Elevated d-dimer 11/11/2018  . SVT (supraventricular tachycardia) ([place]) 10/04/2018  . IUD (intrauterine device) in place 07/11/2015  . Other disorder of menstruation and other abnormal bleeding from female genital tract 10/30/2012    Past Surgical History:  Procedure Laterality Date  . HYSTEROSCOPY WITH D & C N/A 09/27/2013   Procedure: DILATATION AND CURETTAGE /HYSTEROSCOPY and insertion of Mirena IUD ;  Surgeon: [name]. Harrington Challenger, MD;  Location: [facility];  Service: Gynecology;  Laterality: N/A;   . MYOMECTOMY  02/03/2011   Procedure: MYOMECTOMY;  Surgeon: [name], MD;  Location: [facility];  Service: Gynecology;  Laterality: N/A;  . SVT ABLATION N/A 11/08/2018   Procedure: SVT ABLATION;  Surgeon: [name], MD;  Location: [facility];  Service: Cardiovascular;  Laterality: N/A;  . TONSILLECTOMY       OB History    Gravida  1   Para      Term      Preterm      AB  1   Living        SAB  1   IAB      Ectopic      Multiple      Live Births              Family History  Problem Relation Age of Onset  . Cirrhosis Mother   . Diabetes Mother   . Cancer Maternal Grandfather   . Hypertension Sister   . Diabetes Sister   . Anesthesia problems Neg Hx   . Hypotension Neg Hx   . Malignant hyperthermia Neg Hx   . Pseudochol deficiency Neg Hx     Social History   Tobacco Use  . Smoking status: Former Research scientist (life sciences)  . Smokeless tobacco: Never Used  Vaping Use  . Vaping Use: Never used  Substance Use Topics  . Alcohol use: Yes    Comment: occasional  . Drug use: No  Home Medications Prior to Admission medications   Medication Sig Start Date End Date Taking? Authorizing Provider  acetaminophen (TYLENOL) 325 MG tablet Take 650 mg by mouth every 6 (six) hours as needed for headache.   Yes [provider]  albuterol (PROVENTIL HFA) 108 (90 Base) MCG/ACT inhaler Inhale 1-2 puffs into the lungs every 6 (six) hours as needed for wheezing or shortness of breath.    Yes [provider]  aspirin 325 MG tablet Take 325 mg by mouth daily as needed for mild pain.   Yes [provider]  flecainide (TAMBOCOR) 150 MG tablet Take 0.5 tablets (75 mg total) by mouth 2 (two) times daily. 10/30/19  Yes [name], MD  HYDROcodone-acetaminophen (NORCO/VICODIN) 5-325 MG tablet Take 1-2 tablets by mouth every 6 (six) hours as needed for moderate pain or severe pain. 11/28/19  Yes Charlesetta Shanks, MD  levonorgestrel (MIRENA) 20 MCG/24HR IUD 1  each by Intrauterine route once.    Yes [provider]  Multiple Vitamins-Calcium (ONE-A-DAY WOMENS PO) Take 1 tablet by mouth daily.   Yes [provider]  cephALEXin (KEFLEX) 500 MG capsule Take 1 capsule (500 mg total) by mouth 3 (three) times daily. 04/15/20   Takyia Sindt, Barbette Hair, MD  diltiazem (CARDIZEM CD) 240 MG 24 hr capsule Take 1 capsule (240 mg total) by mouth daily. Patient not taking: No sig reported 06/14/19 06/08/20  Erma Heritage, PA-C  fluconazole (DIFLUCAN) 200 MG tablet Take 1 tablet by mouth today.  Take the next tablet by mouth whenever you finish your antibiotics. Patient not taking: Reported on 04/15/2020 02/05/20   Volanda Napoleon, PA-C  apixaban (ELIQUIS) 5 MG TABS tablet Take 2 tablets (10mg ) twice daily for 7 days, then 1 tablet (5mg ) twice daily 04/20/19 04/20/19  Rodell Perna A, PA-C    Allergies    Shellfish allergy, Sulfa antibiotics, and Adhesive [tape]  Review of Systems   Review of Systems  Constitutional: Negative for fever.  Respiratory: Negative for shortness of breath.   Cardiovascular: Negative for chest pain.  Gastrointestinal: Positive for abdominal pain and nausea. Negative for constipation, diarrhea and vomiting.  Genitourinary: Positive for frequency, hematuria and urgency. Negative for dysuria.  All other systems reviewed and are negative.   Physical Exam Updated Vital Signs BP 104/61   Pulse 81   Temp 98.5 F (36.9 C)   Resp (!) 21   SpO2 96%   Physical Exam Vitals and nursing note reviewed.  Constitutional:      Appearance: She is well-developed and well-nourished. She is obese. She is not ill-appearing.  HENT:     Head: Normocephalic and atraumatic.  Eyes:     Pupils: Pupils are equal, round, and reactive to light.  Cardiovascular:     Rate and Rhythm: Normal rate and regular rhythm.     Heart sounds: Normal heart sounds.  Pulmonary:     Effort: Pulmonary effort is normal. No respiratory distress.      Breath sounds: No wheezing.  Abdominal:     General: Bowel sounds are normal.     Palpations: Abdomen is soft.     Tenderness: There is abdominal tenderness in the suprapubic area and left lower quadrant. There is no left CVA tenderness, guarding or rebound.  Musculoskeletal:     Cervical back: Neck supple.  Skin:    General: Skin is warm and dry.  Neurological:     Mental Status: She is alert and oriented to person, place, and time.  Psychiatric:        Mood and Affect: Mood and affect and mood normal.     ED Results / Procedures / Treatments   Labs (all labs ordered are listed, but only abnormal results are displayed) Labs Reviewed  COMPREHENSIVE METABOLIC PANEL - Abnormal; Notable for the following components:      Result Value   AST 14 (*)    All other components within normal limits  URINALYSIS, ROUTINE W REFLEX MICROSCOPIC - Abnormal; Notable for the following components:   APPearance HAZY (*)    Hgb urine dipstick MODERATE (*)    Leukocytes,Ua LARGE (*)    Bacteria, UA FEW (*)    All other components within normal limits  URINE CULTURE  LIPASE, BLOOD  CBC  I-STAT BETA HCG BLOOD, ED ([facility], [facility], [facility] ONLY)    EKG None  Radiology CT Renal Stone Study  Result Date: 04/15/2020 CLINICAL DATA:  Left-sided flank, back, and epigastric pain with hematuria and urinary frequency. EXAM: CT ABDOMEN AND PELVIS WITHOUT CONTRAST TECHNIQUE: Multidetector CT imaging of the abdomen and pelvis was performed following the standard protocol without IV contrast. COMPARISON:  Pelvic ultrasound 02/05/2020. CT abdomen and pelvis 02/05/2020. FINDINGS: Lower chest: Clear lung bases. Hepatobiliary: No focal liver abnormality is seen. No gallstones, gallbladder wall thickening, or biliary dilatation. Pancreas: Unremarkable. Spleen: Unremarkable. Adrenals/Urinary Tract: Unremarkable adrenal glands. No evidence of renal calculi, mass, or hydronephrosis. Unremarkable bladder. Stomach/Bowel: The stomach is  unremarkable. There is no evidence of bowel obstruction or inflammation. The appendix is unremarkable. Vascular/Lymphatic: Normal caliber of the abdominal aorta. No enlarged lymph nodes. Reproductive: 2 IUDs remain in place. Known uterine fibroids. No adnexal mass. Other: No intraperitoneal free fluid. Musculoskeletal: No acute osseous abnormality or suspicious osseous lesion. IMPRESSION: 1. No evidence of urinary tract calculi, urinary obstruction, or other acute abnormality in the abdomen or pelvis. 2. Uterine fibroids. Electronically Signed   By: Logan Bores M.D.   On: 04/15/2020 05:19    Procedures Procedures (including critical care time)  Medications Ordered in ED Medications  cephALEXin (KEFLEX) capsule 500 mg (has no administration in time range)  morphine 4 MG/ML injection 4 mg (4 mg Intravenous Given 04/15/20 0515)  ondansetron (ZOFRAN) injection 4 mg (4 mg Intravenous Given 04/15/20 0515)    ED Course  I have reviewed the triage vital signs and the nursing notes.  Pertinent labs & imaging results that were available during my care of the patient were reviewed by me and considered in my medical decision making (see chart for details).    MDM Rules/Calculators/A&P                          Patient presents with abdominal pain.  Mostly suprapubic and left-sided.  She is overall nontoxic and vital signs are reassuring.  She has had several visits in the past for the same.  She was noted to have 2 IUDs on a CT scan in October.  Pelvic ultrasound was reassuring at that time.  She was sent from urgent care for further evaluation.  She is nontoxic-appearing.  She does have some tenderness.  No CVA tenderness.  She is afebrile.  Considerations include but not limited to, UTI, kidney stone, diverticulitis, ovarian pathology.  Patient given pain and nausea medication.  Lab reviewed from triage.  She has 21-50 white cells and bacteria present in her urine.  We will culture.  Given hematuria and  urgency, would elect to treat.  Will obtain CT stone study to rule out infected stone.  CT stone study is negative.  It continues to show presence of 2 IUDs.  Ovaries do not appear significantly enlarged or and are without mass.  Doubt torsion.  Suspect patient symptoms are related to UTI.  Will treat with Keflex.  Patient is agreeable to plan.  She will follow up with OB/GYN for IUD removal as planned.   After history, exam, and medical workup I feel the patient has been appropriately medically screened and is safe for discharge home. Pertinent diagnoses were discussed with the patient. Patient was given return precautions.    Final Clinical Impression(s) / ED Diagnoses Final diagnoses:  Acute cystitis with hematuria    Rx / DC Orders ED Discharge Orders         Ordered    cephALEXin (KEFLEX) 500 MG capsule  3 times daily        04/15/20 0543           Charnel Giles, Barbette Hair, MD 04/15/20 [phone]

## 2020-04-16 LAB — URINE CULTURE

## 2020-04-28 ENCOUNTER — Ambulatory Visit: Payer: 59 | Admitting: Advanced Practice Midwife

## 2020-05-08 ENCOUNTER — Ambulatory Visit (HOSPITAL_COMMUNITY)
Admission: EM | Admit: 2020-05-08 | Discharge: 2020-05-08 | Disposition: A | Discharge status: Home / Self Care | Payer: HRSA Program | Attending: Family Medicine | Admitting: Family Medicine

## 2020-05-08 ENCOUNTER — Other Ambulatory Visit: Payer: Self-pay

## 2020-05-08 DIAGNOSIS — Z20822 Contact with and (suspected) exposure to covid-19: Secondary | ICD-10-CM | POA: Diagnosis not present

## 2020-05-08 LAB — SARS CORONAVIRUS 2 (TAT 6-24 HRS): SARS Coronavirus 2: NEGATIVE

## 2020-05-08 NOTE — ED Triage Notes (Signed)
Pt reports exposure to COVID last Wednesday. Pt states she does not have sxs.

## 2020-06-03 ENCOUNTER — Emergency Department (HOSPITAL_COMMUNITY)
Admission: EM | Admit: 2020-06-03 | Discharge: 2020-06-03 | Disposition: A | Discharge status: Home / Self Care | Payer: 59 | Attending: Emergency Medicine | Admitting: Emergency Medicine

## 2020-06-03 ENCOUNTER — Emergency Department (HOSPITAL_COMMUNITY): Payer: 59

## 2020-06-03 ENCOUNTER — Other Ambulatory Visit: Payer: Self-pay

## 2020-06-03 ENCOUNTER — Encounter (HOSPITAL_COMMUNITY): Payer: Self-pay | Admitting: *Deleted

## 2020-06-03 DIAGNOSIS — Z87891 Personal history of nicotine dependence: Secondary | ICD-10-CM | POA: Diagnosis not present

## 2020-06-03 DIAGNOSIS — I10 Essential (primary) hypertension: Secondary | ICD-10-CM | POA: Diagnosis not present

## 2020-06-03 DIAGNOSIS — M25511 Pain in right shoulder: Secondary | ICD-10-CM | POA: Diagnosis not present

## 2020-06-03 DIAGNOSIS — Z79899 Other long term (current) drug therapy: Secondary | ICD-10-CM | POA: Insufficient documentation

## 2020-06-03 MED ORDER — IBUPROFEN 800 MG PO TABS: 800.0000 mg | ORAL_TABLET | Freq: Three times a day (TID) | ORAL | 0 refills | Status: DC

## 2020-06-03 MED ORDER — TRAMADOL HCL 50 MG PO TABS: 50.0000 mg | ORAL_TABLET | Freq: Four times a day (QID) | ORAL | 0 refills | Status: DC | PRN

## 2020-06-03 NOTE — ED Provider Notes (Signed)
[facility] EMERGENCY DEPARTMENT Provider Note   CSN: [id number] Arrival date & time: 06/03/20  1346     History Chief Complaint  Patient presents with  . Shoulder Pain    Jessica Sanchez is a 35 y.o. female.  HPI      Jessica Sanchez is a 35 y.o. female who presents to the Emergency Department complaining of worsening pain of her right shoulder.  Pain is been gradually worsening for several days.  She felt a "crunch" of her right shoulder when lifting heavy objects at work yesterday.  She states that she has been seen by orthopedics in the past for pain of her right shoulder.  She has received steroid injections several years ago to her shoulder that provided some relief, but she has not had orthopedic follow-up recently.  She does have an appointment on 210 with local orthopedics.  She is here requesting pain control until her appointment.  She describes having constant aching pain of her shoulder that radiates to her lateral right neck and right shoulder blade area.  She has intermittent stinging and tingling radiating to her right elbow.  No pain weakness or numbness of her fingers.  She has tried heat and Tylenol with no relief.  Pain is worse with attempted abduction and improves when her right arm is at rest    Past Medical History:  Diagnosis Date  . Fibroid (bleeding) (uterine)   . Fibroid, uterine   . H/O metrorrhagia   . Hypertension   . IUD (intrauterine device) in place 07/11/2015  . Menorrhagia   . Obesity, Class III, BMI 40-49.9 (morbid obesity) ([place]) 11/11/2018  . SVT (supraventricular tachycardia) [facility])     Patient Active Problem List   Diagnosis Date Noted  . Elevated troponin   . Attempted IUD removal, unsuccessful 07/05/2019  . Encounter for IUD insertion 07/05/2019  . Gastroesophageal reflux disease   . Nonspecific chest pain 11/11/2018  . Morbid obesity with body mass index (BMI) of 60.0 to 69.9 in adult ([place]) 11/11/2018  . Elevated d-dimer 11/11/2018  .  SVT (supraventricular tachycardia) ([place]) 10/04/2018  . IUD (intrauterine device) in place 07/11/2015  . Other disorder of menstruation and other abnormal bleeding from female genital tract 10/30/2012    Past Surgical History:  Procedure Laterality Date  . HYSTEROSCOPY WITH D & C N/A 09/27/2013   Procedure: DILATATION AND CURETTAGE /HYSTEROSCOPY and insertion of Mirena IUD ;  Surgeon: [name]. Harrington Challenger, MD;  Location: [facility];  Service: Gynecology;  Laterality: N/A;  . MYOMECTOMY  02/03/2011   Procedure: MYOMECTOMY;  Surgeon: [name], MD;  Location: [facility];  Service: Gynecology;  Laterality: N/A;  . SVT ABLATION N/A 11/08/2018   Procedure: SVT ABLATION;  Surgeon: [name], MD;  Location: [place] [facility];  Service: Cardiovascular;  Laterality: N/A;  . TONSILLECTOMY       OB History    Gravida  1   Para      Term      Preterm      AB  1   Living        SAB  1   IAB      Ectopic      Multiple      Live Births              Family History  Problem Relation Age of Onset  . Cirrhosis Mother   . Diabetes Mother   . Cancer Maternal Grandfather   .  Hypertension Sister   . Diabetes Sister   . Anesthesia problems Neg Hx   . Hypotension Neg Hx   . Malignant hyperthermia Neg Hx   . Pseudochol deficiency Neg Hx     Social History   Tobacco Use  . Smoking status: Former Research scientist (life sciences)  . Smokeless tobacco: Never Used  Vaping Use  . Vaping Use: Never used  Substance Use Topics  . Alcohol use: Yes    Comment: occasional  . Drug use: No    Home Medications Prior to Admission medications   Medication Sig Start Date End Date Taking? Authorizing Provider  acetaminophen (TYLENOL) 325 MG tablet Take 650 mg by mouth every 6 (six) hours as needed for headache.    [provider]  albuterol (PROVENTIL HFA) 108 (90 Base) MCG/ACT inhaler Inhale 1-2 puffs into the lungs every 6 (six) hours as needed for wheezing or shortness of breath.     [provider]   aspirin 325 MG tablet Take 325 mg by mouth daily as needed for mild pain.    [provider]  cephALEXin (KEFLEX) 500 MG capsule Take 1 capsule (500 mg total) by mouth 3 (three) times daily. 04/15/20   Horton, Barbette Hair, MD  diltiazem (CARDIZEM CD) 240 MG 24 hr capsule Take 1 capsule (240 mg total) by mouth daily. Patient not taking: No sig reported 06/14/19 06/08/20  Erma Heritage, PA-C  flecainide (TAMBOCOR) 150 MG tablet Take 0.5 tablets (75 mg total) by mouth 2 (two) times daily. 10/30/19   [name], MD  fluconazole (DIFLUCAN) 200 MG tablet Take 1 tablet by mouth today.  Take the next tablet by mouth whenever you finish your antibiotics. Patient not taking: Reported on 04/15/2020 02/05/20   Volanda Napoleon, PA-C  HYDROcodone-acetaminophen (NORCO/VICODIN) 5-325 MG tablet Take 1-2 tablets by mouth every 6 (six) hours as needed for moderate pain or severe pain. 11/28/19   Charlesetta Shanks, MD  levonorgestrel (MIRENA) 20 MCG/24HR IUD 1 each by Intrauterine route once.     [provider]  Multiple Vitamins-Calcium (ONE-A-DAY WOMENS PO) Take 1 tablet by mouth daily.    [provider]  apixaban (ELIQUIS) 5 MG TABS tablet Take 2 tablets (10mg ) twice daily for 7 days, then 1 tablet (5mg ) twice daily 04/20/19 04/20/19  Rodell Perna A, PA-C    Allergies    Shellfish allergy, Sulfa antibiotics, and Adhesive [tape]  Review of Systems   Review of Systems  Constitutional: Negative for chills and fever.  Respiratory: Negative for shortness of breath.   Cardiovascular: Negative for chest pain.  Gastrointestinal: Negative for nausea and vomiting.  Genitourinary: Negative for difficulty urinating.  Musculoskeletal: Positive for arthralgias (Right shoulder pain). Negative for back pain, joint swelling and neck pain.  Skin: Negative for color change and wound.  Neurological: Negative for syncope, weakness and numbness.    Physical Exam Updated Vital Signs BP 127/78  (BP Location: Right Arm)   Pulse 85   Temp 98.4 F (36.9 C) (Oral)   Resp 20   Ht 5\' 2"  (1.575 m)   Wt (!) 145.2 kg   SpO2 99%   BMI 58.53 kg/m   Physical Exam Vitals and nursing note reviewed.  Constitutional:      Appearance: Normal appearance. She is not ill-appearing.  HENT:     Head: Atraumatic.  Eyes:     Pupils: Pupils are equal, round, and reactive to light.  Cardiovascular:     Rate and Rhythm: Normal rate and  regular rhythm.     Pulses: Normal pulses.  Pulmonary:     Effort: Pulmonary effort is normal.  Chest:     Chest wall: No tenderness.  Musculoskeletal:        General: Tenderness and signs of injury present. No swelling or deformity.     Right shoulder: Tenderness present. No swelling or crepitus. Decreased range of motion. Normal strength. Normal pulse.     Cervical back: Normal range of motion. Tenderness (Tenderness palpation along the right cervical paraspinal muscles.  No midline tenderness.) present.     Comments: Diffuse tenderness of the right shoulder.   Skin:    General: Skin is warm.     Capillary Refill: Capillary refill takes less than 2 seconds.     Findings: No erythema or rash.  Neurological:     General: No focal deficit present.     Mental Status: She is alert.     Sensory: No sensory deficit.     Motor: No weakness.     ED Results / Procedures / Treatments   Labs (all labs ordered are listed, but only abnormal results are displayed) Labs Reviewed - No data to display  EKG None  Radiology DG Shoulder Right  Result Date: 06/03/2020 CLINICAL DATA:  Right shoulder pain EXAM: RIGHT SHOULDER - 2+ VIEW COMPARISON:  None. FINDINGS: There is no evidence of fracture or dislocation. There is no evidence of arthropathy or other focal bone abnormality. Soft tissues are unremarkable. IMPRESSION: Negative. Electronically Signed   By: Prudencio Pair M.D.   On: 06/03/2020 15:19    Procedures Procedures   Medications Ordered in ED Medications  - No data to display  ED Course  I have reviewed the triage vital signs and the nursing notes.  Pertinent labs & imaging results that were available during my care of the patient were reviewed by me and considered in my medical decision making (see chart for details).    MDM Rules/Calculators/A&P                          Patient here with likely acute on chronic right shoulder pain.  Worsening pain yesterday when lifting heavy objects at work.  Has an appointment with orthopedics on 06/13/2019.  Here requesting pain control until her upcoming appointment.  Review of medical records, no recent imaging on file.  On exam, she has diffuse tenderness of the right shoulder joint.  No edema erythema or excessive warmth.  No concerning symptoms for septic joint.  Neurovascularly intact.  X-ray shoulder negative for acute bony injury.  Feel that she is appropriate for discharge home, agrees to symptomatic treatment and to keep her upcoming orthopedic appointment.   Final Clinical Impression(s) / ED Diagnoses Final diagnoses:  Acute pain of right shoulder    Rx / DC Orders ED Discharge Orders    None       Kem Parkinson, PA-C 06/03/20 1537    Truddie Hidden, MD 06/03/20 [phone]

## 2020-06-03 NOTE — ED Triage Notes (Signed)
Pt with continued right shoulder pain.  Have an appt on 2/10 to be seen again with ortho for it.  Pt states she can't wait til then due to the pain.

## 2020-06-03 NOTE — Discharge Instructions (Addendum)
Keep your appointment with Dr. Aline Brochure for February 10.  Alternating ice on and off to your shoulder.

## 2020-06-05 ENCOUNTER — Ambulatory Visit (INDEPENDENT_AMBULATORY_CARE_PROVIDER_SITE_OTHER): Payer: 59 | Admitting: Orthopedic Surgery

## 2020-06-05 ENCOUNTER — Encounter: Payer: Self-pay | Admitting: Orthopedic Surgery

## 2020-06-05 ENCOUNTER — Other Ambulatory Visit: Payer: Self-pay

## 2020-06-05 VITALS — BP 126/76 | HR 76 | Ht 62.0 in | Wt 322.0 lb

## 2020-06-05 DIAGNOSIS — M75121 Complete rotator cuff tear or rupture of right shoulder, not specified as traumatic: Secondary | ICD-10-CM

## 2020-06-05 DIAGNOSIS — M75101 Unspecified rotator cuff tear or rupture of right shoulder, not specified as traumatic: Secondary | ICD-10-CM

## 2020-06-05 DIAGNOSIS — Z6841 Body Mass Index (BMI) 40.0 and over, adult: Secondary | ICD-10-CM

## 2020-06-05 NOTE — Progress Notes (Signed)
Chief Complaint  Patient presents with  . Follow-up    Right shoulder pain   Assessment and plan  35 year old female with chronic pain right shoulder failed nonoperative treatment including injection and physical therapy and NSAIDs  Recommend MRI right shoulder rule out rotator cuff tear.  This is a 35 year old female I saw back in 2019.she had a right rotator cuff tear after she did not improve after anti-inflammatories and injection  She came back this December with similar symptoms of pain loss of motion and weakness in her right rotator cuff.  She was scheduled for an MRI a second time but she says she did not have a ride  She presents back again with pain and tenderness and loss of motion right shoulder.  She went to the emergency room she was put on ibuprofen and tramadol.  She still says she has severe pain in the right shoulder and cannot lift her arm above her head.  Past Medical History:  Diagnosis Date  . Fibroid (bleeding) (uterine)   . Fibroid, uterine   . H/O metrorrhagia   . Hypertension   . IUD (intrauterine device) in place 07/11/2015  . Menorrhagia   . Obesity, Class III, BMI 40-49.9 (morbid obesity) ([place]) 11/11/2018  . SVT (supraventricular tachycardia) ([facility])    BP 126/76   Pulse 76   Ht 5\' 2"  (1.575 m)   Wt (!) 322 lb (146.1 kg)   BMI 58.89 kg/m   The patient meets the AMA guidelines for Morbid (severe) obesity with a BMI > 40.0 and I have recommended weight loss.  Right Shoulder Exam   Tenderness  Right shoulder tenderness location: Anterior joint line anterior deltoid.  Range of Motion  Active abduction:  70 abnormal  Passive abduction:  80 (Pain with abduction) abnormal  Extension:  30 (Pain with extension) abnormal  Right shoulder external rotation: Painful external rotation without limitation.  Forward flexion: 80   Muscle Strength  Abduction: 4/5  Internal rotation: 5/5  External rotation: 5/5  Supraspinatus: 3/5  Subscapularis: 5/5   Biceps: 5/5   Tests  Apprehension: negative Cross arm: negative Impingement: positive Drop arm: positive Sulcus: absent  Other  Erythema: absent Sensation: normal Pulse: present     Encounter Diagnoses  Name Primary?  . Rotator cuff syndrome of right shoulder Yes  . Nontraumatic complete tear of right rotator cuff   . Body mass index 50.0-59.9, adult ([place])   . Morbid obesity ([place])

## 2020-06-12 ENCOUNTER — Ambulatory Visit: Payer: Medicaid Other | Admitting: Orthopedic Surgery

## 2020-06-19 ENCOUNTER — Other Ambulatory Visit: Payer: Self-pay

## 2020-06-19 ENCOUNTER — Ambulatory Visit (HOSPITAL_COMMUNITY)
Admission: RE | Admit: 2020-06-19 | Discharge: 2020-06-19 | Disposition: A | Discharge status: Home / Self Care | Payer: 59 | Source: Ambulatory Visit | Attending: Orthopedic Surgery | Admitting: Orthopedic Surgery

## 2020-06-19 ENCOUNTER — Telehealth: Payer: Self-pay | Admitting: Orthopedic Surgery

## 2020-06-19 ENCOUNTER — Encounter (HOSPITAL_COMMUNITY): Payer: Self-pay

## 2020-06-19 ENCOUNTER — Other Ambulatory Visit: Payer: Self-pay | Admitting: Orthopedic Surgery

## 2020-06-19 DIAGNOSIS — M75101 Unspecified rotator cuff tear or rupture of right shoulder, not specified as traumatic: Secondary | ICD-10-CM

## 2020-06-19 MED ORDER — DIAZEPAM 5 MG PO TABS: 5.0000 mg | ORAL_TABLET | Freq: Once | ORAL | 0 refills | Status: AC

## 2020-06-19 NOTE — Telephone Encounter (Signed)
Patient came by and stated that she was unable to complete the MRI due to getting  claustrophobic during this process.  She asks to be scheduled for an open unit.  I told her that this would have to be approved through her insurance company first and then someone would give her a a call to get this scheduled  She understands

## 2020-06-19 NOTE — Telephone Encounter (Signed)
Called [facility] and changed facility, no changes to auth #.Spoke with pt and let her know the change, and gave pt # to  [facility] to schedule.   Dr. Aline Brochure will you please send pt something to help her with procedure? Pharmacy verified [facility]).

## 2020-06-25 ENCOUNTER — Ambulatory Visit
Admission: EM | Admit: 2020-06-25 | Discharge: 2020-06-25 | Disposition: A | Discharge status: Home / Self Care | Payer: 59 | Attending: Physician Assistant | Admitting: Physician Assistant

## 2020-06-25 ENCOUNTER — Encounter: Payer: Self-pay | Admitting: Emergency Medicine

## 2020-06-25 DIAGNOSIS — M25511 Pain in right shoulder: Secondary | ICD-10-CM | POA: Diagnosis not present

## 2020-06-25 MED ORDER — HYDROCODONE-ACETAMINOPHEN 5-325 MG PO TABS: 1.0000 | ORAL_TABLET | ORAL | 0 refills | Status: DC | PRN

## 2020-06-25 MED ORDER — PREDNISONE 50 MG PO TABS: ORAL_TABLET | ORAL | 0 refills | Status: DC

## 2020-06-25 NOTE — Discharge Instructions (Signed)
See Dr. Aline Brochure for recheck.  Return if any problems.

## 2020-06-25 NOTE — ED Triage Notes (Signed)
RT shoulder and neck pain for over a year.  Has MRI march 10th but states she cant take the pain.

## 2020-06-26 ENCOUNTER — Emergency Department (HOSPITAL_COMMUNITY): Payer: 59

## 2020-06-26 ENCOUNTER — Encounter (HOSPITAL_COMMUNITY): Payer: Self-pay | Admitting: Emergency Medicine

## 2020-06-26 ENCOUNTER — Other Ambulatory Visit: Payer: Self-pay

## 2020-06-26 ENCOUNTER — Emergency Department (HOSPITAL_COMMUNITY)
Admission: EM | Admit: 2020-06-26 | Discharge: 2020-06-26 | Disposition: A | Discharge status: Home / Self Care | Payer: 59 | Attending: Emergency Medicine | Admitting: Emergency Medicine

## 2020-06-26 DIAGNOSIS — R609 Edema, unspecified: Secondary | ICD-10-CM | POA: Insufficient documentation

## 2020-06-26 DIAGNOSIS — Z8679 Personal history of other diseases of the circulatory system: Secondary | ICD-10-CM | POA: Insufficient documentation

## 2020-06-26 DIAGNOSIS — Z87891 Personal history of nicotine dependence: Secondary | ICD-10-CM | POA: Insufficient documentation

## 2020-06-26 DIAGNOSIS — I10 Essential (primary) hypertension: Secondary | ICD-10-CM | POA: Diagnosis not present

## 2020-06-26 DIAGNOSIS — R0602 Shortness of breath: Secondary | ICD-10-CM | POA: Insufficient documentation

## 2020-06-26 DIAGNOSIS — R0789 Other chest pain: Secondary | ICD-10-CM | POA: Insufficient documentation

## 2020-06-26 DIAGNOSIS — R002 Palpitations: Secondary | ICD-10-CM | POA: Diagnosis not present

## 2020-06-26 LAB — CBC WITH DIFFERENTIAL/PLATELET
Abs Immature Granulocytes: 0.03 10*3/uL (ref 0.00–0.07)
Basophils Absolute: 0 10*3/uL (ref 0.0–0.1)
Basophils Relative: 0 %
Eosinophils Absolute: 0.1 10*3/uL (ref 0.0–0.5)
Eosinophils Relative: 2 %
HCT: 38.6 % (ref 36.0–46.0)
Hemoglobin: 11.9 g/dL — ABNORMAL LOW (ref 12.0–15.0)
Immature Granulocytes: 1 %
Lymphocytes Relative: 35 %
Lymphs Abs: 2.2 10*3/uL (ref 0.7–4.0)
MCH: 27.2 pg (ref 26.0–34.0)
MCHC: 30.8 g/dL (ref 30.0–36.0)
MCV: 88.3 fL (ref 80.0–100.0)
Monocytes Absolute: 0.5 10*3/uL (ref 0.1–1.0)
Monocytes Relative: 7 %
Neutro Abs: 3.4 10*3/uL (ref 1.7–7.7)
Neutrophils Relative %: 55 %
Platelets: 295 10*3/uL (ref 150–400)
RBC: 4.37 MIL/uL (ref 3.87–5.11)
RDW: 15.3 % (ref 11.5–15.5)
WBC: 6.2 10*3/uL (ref 4.0–10.5)
nRBC: 0 % (ref 0.0–0.2)

## 2020-06-26 LAB — COMPREHENSIVE METABOLIC PANEL
ALT: 17 U/L (ref 0–44)
AST: 16 U/L (ref 15–41)
Albumin: 3.6 g/dL (ref 3.5–5.0)
Alkaline Phosphatase: 51 U/L (ref 38–126)
Anion gap: 7 (ref 5–15)
BUN: 12 mg/dL (ref 6–20)
CO2: 25 mmol/L (ref 22–32)
Calcium: 8.5 mg/dL — ABNORMAL LOW (ref 8.9–10.3)
Chloride: 103 mmol/L (ref 98–111)
Creatinine, Ser: 0.64 mg/dL (ref 0.44–1.00)
GFR, Estimated: >60 mL/min (ref >60)
Glucose, Bld: 108 mg/dL — ABNORMAL HIGH (ref 70–99)
Potassium: 3.8 mmol/L (ref 3.5–5.1)
Sodium: 135 mmol/L (ref 135–145)
Total Bilirubin: 0.5 mg/dL (ref 0.3–1.2)
Total Protein: 7.1 g/dL (ref 6.5–8.1)

## 2020-06-26 LAB — TROPONIN I (HIGH SENSITIVITY)
Troponin I (High Sensitivity): <2 ng/L (ref <18)
Troponin I (High Sensitivity): <2 ng/L (ref <18)

## 2020-06-26 LAB — I-STAT BETA HCG BLOOD, ED (MC, WL, AP ONLY)
11800305105: NEGATIVE
I-stat hCG, quantitative: <5 m[IU]/mL (ref <5)

## 2020-06-26 LAB — LIPASE, BLOOD: Lipase: 22 U/L (ref 11–51)

## 2020-06-26 MED ORDER — LIDOCAINE VISCOUS HCL 2 % MT SOLN
15.0000 mL | Freq: Once | OROMUCOSAL | Status: AC
  Administered 2020-06-26: 15 mL via ORAL
  Filled 2020-06-26: qty 15

## 2020-06-26 MED ORDER — ONDANSETRON HCL 4 MG/2ML IJ SOLN
4.0000 mg | Freq: Once | INTRAMUSCULAR | Status: AC
  Administered 2020-06-26: 4 mg via INTRAVENOUS
  Filled 2020-06-26: qty 2

## 2020-06-26 MED ORDER — PANTOPRAZOLE SODIUM 40 MG IV SOLR
40.0000 mg | Freq: Once | INTRAVENOUS | Status: AC
  Administered 2020-06-26: 40 mg via INTRAVENOUS
  Filled 2020-06-26: qty 40

## 2020-06-26 MED ORDER — NITROGLYCERIN 0.4 MG SL SUBL
0.4000 mg | SUBLINGUAL_TABLET | Freq: Once | SUBLINGUAL | Status: AC
  Administered 2020-06-26: 0.4 mg via SUBLINGUAL
  Filled 2020-06-26: qty 1

## 2020-06-26 MED ORDER — PANTOPRAZOLE SODIUM 20 MG PO TBEC: 20.0000 mg | DELAYED_RELEASE_TABLET | Freq: Every day | ORAL | 0 refills | Status: DC

## 2020-06-26 MED ORDER — IOHEXOL 350 MG/ML SOLN
100.0000 mL | Freq: Once | INTRAVENOUS | Status: AC | PRN
  Administered 2020-06-26: 100 mL via INTRAVENOUS

## 2020-06-26 MED ORDER — KETOROLAC TROMETHAMINE 30 MG/ML IJ SOLN
15.0000 mg | Freq: Once | INTRAMUSCULAR | Status: AC
  Administered 2020-06-26: 15 mg via INTRAVENOUS
  Filled 2020-06-26: qty 1

## 2020-06-26 MED ORDER — ALUM & MAG HYDROXIDE-SIMETH 200-200-20 MG/5ML PO SUSP
30.0000 mL | Freq: Once | ORAL | Status: AC
  Administered 2020-06-26: 30 mL via ORAL
  Filled 2020-06-26: qty 30

## 2020-06-26 NOTE — Discharge Instructions (Signed)
Continue taking home medications as prescribed. Take Protonix daily for the next week, as your symptoms may be related to stomach acid. Follow-up with your heart doctor for further evaluation of your symptoms. Return to the emergency room if you develop high fevers, persistent pain, difficulty breathing or any new, sudden, or concerning symptoms.

## 2020-06-26 NOTE — ED Notes (Signed)
Pt returns from CT feeling nauseated and dry heaving. PA notified.

## 2020-06-26 NOTE — ED Provider Notes (Signed)
[facility] EMERGENCY DEPARTMENT Provider Note   CSN: [id number] Arrival date & time: 06/26/20  1207     History Chief Complaint  Patient presents with  . Chest Pain    Jessica Sanchez is Sanchez 35 y.o. female presenting for evaluation of chest pain/pressure.  Patient states he was awoken from sleep around 4:00 this morning with chest pressure/heaviness/pain.  It has been constant since.  At that time, she felt her heart was racing, up to 170.  This resolved after Sanchez few hours.  She did take her home cardiac medication, which she states is flecainide.  States Cardizem was discontinued Sanchez few months ago.  She is not taken anything else for her symptoms.  She reports associated shortness of breath.  No recent fevers, chills, cough, nausea, vomiting abdominal pain, urinary symptoms, abnormal bowel movements.  She denies recent travel, surgeries, immobilization, history of cancer, history of previous DVT/PE, or hormone use.  She does report her left leg is more swollen and painful than normal.  HPI     Past Medical History:  Diagnosis Date  . Fibroid (bleeding) (uterine)   . Fibroid, uterine   . H/O metrorrhagia   . Hypertension   . IUD (intrauterine device) in place 07/11/2015  . Menorrhagia   . Obesity, Class III, BMI 40-49.9 (morbid obesity) (Jessica Sanchez) 11/11/2018  . SVT (supraventricular tachycardia) [facility])     Patient Active Problem List   Diagnosis Date Noted  . Elevated troponin   . Attempted IUD removal, unsuccessful 07/05/2019  . Encounter for IUD insertion 07/05/2019  . Gastroesophageal reflux disease   . Nonspecific chest pain 11/11/2018  . Morbid obesity with body mass index (BMI) of 60.0 to 69.9 in adult (Jessica Sanchez) 11/11/2018  . Elevated d-dimer 11/11/2018  . SVT (supraventricular tachycardia) (Jessica Sanchez) 10/04/2018  . IUD (intrauterine device) in place 07/11/2015  . Other disorder of menstruation and other abnormal bleeding from female genital tract 10/30/2012    Past Surgical History:   Procedure Laterality Date  . HYSTEROSCOPY WITH D & C N/Sanchez 09/27/2013   Procedure: DILATATION AND CURETTAGE /HYSTEROSCOPY and insertion of Mirena IUD ;  Surgeon: [name]. Harrington Challenger, MD;  Location: [facility];  Service: Gynecology;  Laterality: N/Sanchez;  . MYOMECTOMY  02/03/2011   Procedure: MYOMECTOMY;  Surgeon: [name], MD;  Location: [facility];  Service: Gynecology;  Laterality: N/Sanchez;  . SVT ABLATION N/Sanchez 11/08/2018   Procedure: SVT ABLATION;  Surgeon: [name], MD;  Location: [place] [facility];  Service: Cardiovascular;  Laterality: N/Sanchez;  . TONSILLECTOMY       OB History    Gravida  1   Para      Term      Preterm      AB  1   Living        SAB  1   IAB      Ectopic      Multiple      Live Births              Family History  Problem Relation Age of Onset  . Cirrhosis Mother   . Diabetes Mother   . Cancer Maternal Grandfather   . Hypertension Sister   . Diabetes Sister   . Anesthesia problems Neg Hx   . Hypotension Neg Hx   . Malignant hyperthermia Neg Hx   . Pseudochol deficiency Neg Hx     Social History   Tobacco Use  . Smoking status: Former Research scientist (life sciences)  .  Smokeless tobacco: Never Used  Vaping Use  . Vaping Use: Never used  Substance Use Topics  . Alcohol use: Yes    Comment: occasional  . Drug use: No    Home Medications Prior to Admission medications   Medication Sig Start Date End Date Taking? Authorizing Provider  acetaminophen (TYLENOL) 325 MG tablet Take 650 mg by mouth every 6 (six) hours as needed for headache.   Yes [provider]  albuterol (PROVENTIL HFA) 108 (90 Base) MCG/ACT inhaler Inhale 1-2 puffs into the lungs every 6 (six) hours as needed for wheezing or shortness of breath.    Yes [provider]  flecainide (TAMBOCOR) 150 MG tablet Take 0.5 tablets (75 mg total) by mouth 2 (two) times daily. 10/30/19  Yes [name], MD  ibuprofen (ADVIL) 800 MG tablet Take 1 tablet (800 mg total) by mouth 3 (three) times  daily. Take with food 06/03/20  Yes Triplett, Tammy, PA-C  Multiple Vitamins-Calcium (ONE-Sanchez-DAY WOMENS PO) Take 1 tablet by mouth daily.   Yes [provider]  pantoprazole (PROTONIX) 20 MG tablet Take 1 tablet (20 mg total) by mouth daily for 7 days. 06/26/20 07/03/20 Yes Jessica Sanchez, Sophia, PA-C  traMADol (ULTRAM) 50 MG tablet Take 1 tablet (50 mg total) by mouth every 6 (six) hours as needed. 06/03/20  Yes Triplett, Tammy, PA-C  diazepam (VALIUM) 5 MG tablet Take 5 mg by mouth once. 06/19/20   [provider]  diltiazem (CARDIZEM CD) 240 MG 24 hr capsule Take 1 capsule (240 mg total) by mouth daily. Patient not taking: Reported on 06/26/2020 06/14/19 06/08/20  Erma Heritage, PA-C  HYDROcodone-acetaminophen (NORCO/VICODIN) 5-325 MG tablet Take 1 tablet by mouth every 4 (four) hours as needed for moderate pain. 06/25/20 06/25/21  Jessica Meadow, PA-C  predniSONE (DELTASONE) 50 MG tablet One tablet Sanchez day 06/25/20   Jessica Meadow, PA-C  apixaban (ELIQUIS) 5 MG TABS tablet Take 2 tablets (10mg ) twice daily for 7 days, then 1 tablet (5mg ) twice daily 04/20/19 04/20/19  Jessica Perna A, PA-C  levonorgestrel (MIRENA) 20 MCG/24HR IUD 1 each by Intrauterine route once.   06/25/20  [provider]    Allergies    Shellfish allergy, Sulfa antibiotics, and Adhesive [tape]  Review of Systems   Review of Systems  Respiratory: Positive for shortness of breath.   Cardiovascular: Positive for chest pain, palpitations and leg swelling.  All other systems reviewed and are negative.   Physical Exam Updated Vital Signs BP (!) 125/52   Pulse 88   Temp 98.2 F (36.8 C) (Oral)   Resp (!) 22   Ht 5\' 2"  (1.575 m)   Wt (!) 145.2 kg   SpO2 98%   BMI 58.53 kg/m   Physical Exam Vitals and nursing note reviewed.  Constitutional:      General: She is not in acute distress.    Appearance: She is well-developed and well-nourished. She is obese.     Comments: Resting in the bed in NAD   HENT:     Head: Normocephalic and atraumatic.  Eyes:     Extraocular Movements: Extraocular movements intact and EOM normal.     Conjunctiva/sclera: Conjunctivae normal.     Pupils: Pupils are equal, round, and reactive to light.  Cardiovascular:     Rate and Rhythm: Normal rate and regular rhythm.     Pulses: Normal pulses and intact distal pulses.     Comments: HR normal Pulmonary:     Effort:  Pulmonary effort is normal. No respiratory distress.     Breath sounds: Normal breath sounds. No wheezing.     Comments: Clear lung sounds Abdominal:     General: There is no distension.     Palpations: Abdomen is soft. There is no mass.     Tenderness: There is no abdominal tenderness. There is no guarding or rebound.  Musculoskeletal:        General: Normal range of motion.     Cervical back: Normal range of motion and neck supple.     Right lower leg: No edema.     Left lower leg: Edema present.     Comments: Mild increased swelling of L leg when compared to R. Pedal pulses 2+. ttp of L calf  Skin:    General: Skin is warm and dry.     Capillary Refill: Capillary refill takes less than 2 seconds.  Neurological:     Mental Status: She is alert and oriented to person, place, and time.  Psychiatric:        Mood and Affect: Mood and affect normal.     ED Results / Procedures / Treatments   Labs (all labs ordered are listed, but only abnormal results are displayed) Labs Reviewed  CBC WITH DIFFERENTIAL/PLATELET - Abnormal; Notable for the following components:      Result Value   Hemoglobin 11.9 (*)    All other components within normal limits  COMPREHENSIVE METABOLIC PANEL - Abnormal; Notable for the following components:   Glucose, Bld 108 (*)    Calcium 8.5 (*)    All other components within normal limits  LIPASE, BLOOD  I-STAT BETA HCG BLOOD, ED ([facility], [facility], [facility] ONLY)  I-STAT BETA HCG BLOOD, ED ([facility], [facility], [facility] ONLY)  TROPONIN I (HIGH SENSITIVITY)  TROPONIN I (HIGH SENSITIVITY)     EKG EKG Interpretation  Date/Time:  Thursday June 26 2020 12:08:54 EST Ventricular Rate:  82 PR Interval:  142 QRS Duration: 78 QT Interval:  352 QTC Calculation: 411 R Axis:   17 Text Interpretation: Sinus rhythm with Fusion complexes Otherwise normal ECG Confirmed by Fredia Sorrow [phone]) on 06/26/2020 2:37:32 PM   Radiology CT Angio Chest PE W/Cm &/Or Wo Cm  Result Date: 06/26/2020 CLINICAL DATA:  Chest pain and central pressure since 4 Sanchez.m., right arm tingling EXAM: CT ANGIOGRAPHY CHEST WITH CONTRAST TECHNIQUE: Multidetector CT imaging of the chest was performed using the standard protocol during bolus administration of intravenous contrast. Multiplanar CT image reconstructions and MIPs were obtained to evaluate the vascular anatomy. CONTRAST:  115mL OMNIPAQUE IOHEXOL 350 MG/ML SOLN COMPARISON:  08/11/2019 FINDINGS: Cardiovascular: This is Sanchez technically adequate evaluation of the pulmonary vasculature. No filling defects or pulmonary emboli. The heart is unremarkable without pericardial effusion. No evidence of thoracic aortic aneurysm or dissection. Mediastinum/Nodes: No enlarged mediastinal, hilar, or axillary lymph nodes. Thyroid gland, trachea, and esophagus demonstrate no significant findings. Lungs/Pleura: No airspace disease, effusion, or pneumothorax. Central airways are patent. Upper Abdomen: No acute abnormality. Musculoskeletal: No acute or destructive bony lesions. Reconstructed images demonstrate no additional findings. Review of the MIP images confirms the above findings. IMPRESSION: 1. No evidence of pulmonary embolus. 2. No acute intrathoracic process. Electronically Signed   By: Randa Ngo M.D.   On: 06/26/2020 16:11   US Venous Img Lower  Left (DVT Study)  Result Date: 06/26/2020 CLINICAL DATA:  LEFT leg pain and swelling EXAM: LEFT LOWER EXTREMITY VENOUS DOPPLER ULTRASOUND TECHNIQUE: Gray-scale sonography with compression, as well as  color and duplex  ultrasound, were performed to evaluate the deep venous system(s) from the level of the common femoral vein through the popliteal and proximal calf veins. COMPARISON:  04/20/2019 FINDINGS: VENOUS Normal compressibility of the common femoral, superficial femoral, and popliteal veins, as well as the visualized calf veins. Visualized portions of profunda femoral vein and great saphenous vein unremarkable. No filling defects to suggest DVT on grayscale or color Doppler imaging. Doppler waveforms show normal direction of venous flow, normal respiratory plasticity and response to augmentation. Limited views of contralateral common femoral vein demonstrate patent and compressible vessel with spontaneous venous flow. OTHER N/Sanchez Limitations: N/Sanchez IMPRESSION: No evidence of deep venous thrombosis in the LEFT lower extremity. Electronically Signed   By: Lavonia Dana M.D.   On: 06/26/2020 14:20    Procedures Procedures   Medications Ordered in ED Medications  pantoprazole (PROTONIX) injection 40 mg (40 mg Intravenous Given 06/26/20 1349)  nitroGLYCERIN (NITROSTAT) SL tablet 0.4 mg (0.4 mg Sublingual Given 06/26/20 1349)  iohexol (OMNIPAQUE) 350 MG/ML injection 100 mL (100 mLs Intravenous Contrast Given 06/26/20 1549)  ondansetron (ZOFRAN) injection 4 mg (4 mg Intravenous Given 06/26/20 1624)  ketorolac (TORADOL) 30 MG/ML injection 15 mg (15 mg Intravenous Given 06/26/20 1659)  alum & mag hydroxide-simeth (MAALOX/MYLANTA) 200-200-20 MG/5ML suspension 30 mL (30 mLs Oral Given 06/26/20 1659)    And  lidocaine (XYLOCAINE) 2 % viscous mouth solution 15 mL (15 mLs Oral Given 06/26/20 1658)    ED Course  I have reviewed the triage vital signs and the nursing notes.  Pertinent labs & imaging results that were available during my care of the patient were reviewed by me and considered in my medical decision making (see chart for details).    MDM Rules/Calculators/Sanchez&P                          Patient presented for  evaluation of chest pain/pressure with associated shortness of breath and palpitations.  Palpitations have since resolved.  On exam, patient appears nontoxic.  Heart rate is normal.  Pulmonary exam overall reassuring.  However patient does have unilateral leg swelling and tenderness.  No risk factors for PE, however in the setting of pain, palpitations, tachypnea, and leg swelling, consider PE/DVT.  Patient with Sanchez history of SVT, this is likely what occurred earlier today, but she is not currently in SVT.  We will continue to monitor.  Consider ACS, although less likely considering patient's age.  Will obtain labs including troponin and EKG.  Consider noncardiac cause such as GERD, as patient was awoken from sleep and she does report Sanchez history of heartburn for which she takes no medication.  Labs interpreted by me, overall reassuring.  Initial troponin negative.  Ultrasound of the leg negative for DVT.  CTA negative for PE or infection.  EKG shows normal sinus rhythm.  Will obtain delta troponin as pain began today.  On reassessment after medications, pt states sxs are improved.  As such, consider GI cause.  However due to patient's cardiac history, will have her follow-up outpatient with cardiology.  Also encouraged primary care follow-up.  Will start on Protonix and see if this improves symptoms.  At this time, patient appears safe for discharge.  Return precautions given.  Patient states she understands and agrees to plan.  Final Clinical Impression(s) / ED Diagnoses Final diagnoses:  Atypical chest pain    Rx / DC Orders ED Discharge Orders  Ordered    pantoprazole (PROTONIX) 20 MG tablet  Daily        06/26/20 1728           Franchot Heidelberg, PA-C 06/26/20 1734    Fredia Sorrow, MD 07/09/20 [phone]

## 2020-06-26 NOTE — ED Triage Notes (Signed)
Pt states she woke up at 0400 with chest pain described as pressure in the center of her chest.  Pt states it makes her right arm a little tingly.

## 2020-06-26 NOTE — ED Provider Notes (Signed)
[facility]    CSN: [id number] Arrival date & time: 06/25/20  1153      History   Chief Complaint No chief complaint on file.   HPI Jessica Sanchez is a 35 y.o. female.   Pt is seeing Dr. Aline Brochure.  Pt is scheduled for an Mri. Pain has worsened   The history is provided by the patient. No language interpreter was used.  Shoulder Pain Location:  Shoulder Shoulder location:  R shoulder Injury: no   Pain details:    Quality:  Aching   Radiates to:  R shoulder   Severity:  Moderate   Onset quality:  Gradual   Timing:  Constant   Progression:  Worsening Dislocation: no   Prior injury to area:  No Relieved by:  Nothing Worsened by:  Nothing Associated symptoms: no swelling     Past Medical History:  Diagnosis Date  . Fibroid (bleeding) (uterine)   . Fibroid, uterine   . H/O metrorrhagia   . Hypertension   . IUD (intrauterine device) in place 07/11/2015  . Menorrhagia   . Obesity, Class III, BMI 40-49.9 (morbid obesity) ([place]) 11/11/2018  . SVT (supraventricular tachycardia) [facility])     Patient Active Problem List   Diagnosis Date Noted  . Elevated troponin   . Attempted IUD removal, unsuccessful 07/05/2019  . Encounter for IUD insertion 07/05/2019  . Gastroesophageal reflux disease   . Nonspecific chest pain 11/11/2018  . Morbid obesity with body mass index (BMI) of 60.0 to 69.9 in adult ([place]) 11/11/2018  . Elevated d-dimer 11/11/2018  . SVT (supraventricular tachycardia) ([place]) 10/04/2018  . IUD (intrauterine device) in place 07/11/2015  . Other disorder of menstruation and other abnormal bleeding from female genital tract 10/30/2012    Past Surgical History:  Procedure Laterality Date  . HYSTEROSCOPY WITH D & C N/A 09/27/2013   Procedure: DILATATION AND CURETTAGE /HYSTEROSCOPY and insertion of Mirena IUD ;  Surgeon: [name]. Harrington Challenger, MD;  Location: [facility];  Service: Gynecology;  Laterality: N/A;  . MYOMECTOMY  02/03/2011   Procedure: MYOMECTOMY;   Surgeon: [name], MD;  Location: [facility];  Service: Gynecology;  Laterality: N/A;  . SVT ABLATION N/A 11/08/2018   Procedure: SVT ABLATION;  Surgeon: [name], MD;  Location: [place] [facility];  Service: Cardiovascular;  Laterality: N/A;  . TONSILLECTOMY      OB History    Gravida  1   Para      Term      Preterm      AB  1   Living        SAB  1   IAB      Ectopic      Multiple      Live Births               Home Medications    Prior to Admission medications   Medication Sig Start Date End Date Taking? Authorizing Provider  HYDROcodone-acetaminophen (NORCO/VICODIN) 5-325 MG tablet Take 1 tablet by mouth every 4 (four) hours as needed for moderate pain. 06/25/20 06/25/21 Yes Fransico Meadow, PA-C  predniSONE (DELTASONE) 50 MG tablet One tablet a day 06/25/20  Yes Fransico Meadow, [place]  acetaminophen (TYLENOL) 325 MG tablet Take 650 mg by mouth every 6 (six) hours as needed for headache.    [provider]  albuterol (PROVENTIL HFA) 108 (90 Base) MCG/ACT inhaler Inhale 1-2 puffs into the lungs every 6 (six) hours as  needed for wheezing or shortness of breath.     [provider]  diltiazem (CARDIZEM CD) 240 MG 24 hr capsule Take 1 capsule (240 mg total) by mouth daily. 06/14/19 06/08/20  Erma Heritage, PA-C  flecainide (TAMBOCOR) 150 MG tablet Take 0.5 tablets (75 mg total) by mouth 2 (two) times daily. 10/30/19   [name], MD  ibuprofen (ADVIL) 800 MG tablet Take 1 tablet (800 mg total) by mouth 3 (three) times daily. Take with food 06/03/20   Triplett, Tammy, PA-C  Multiple Vitamins-Calcium (ONE-A-DAY WOMENS PO) Take 1 tablet by mouth daily.    [provider]  traMADol (ULTRAM) 50 MG tablet Take 1 tablet (50 mg total) by mouth every 6 (six) hours as needed. 06/03/20   Triplett, Tammy, PA-C  apixaban (ELIQUIS) 5 MG TABS tablet Take 2 tablets (10mg ) twice daily for 7 days, then 1 tablet (5mg ) twice daily 04/20/19 04/20/19   Rodell Perna A, PA-C  levonorgestrel (MIRENA) 20 MCG/24HR IUD 1 each by Intrauterine route once.   06/25/20  [provider]    Family History Family History  Problem Relation Age of Onset  . Cirrhosis Mother   . Diabetes Mother   . Cancer Maternal Grandfather   . Hypertension Sister   . Diabetes Sister   . Anesthesia problems Neg Hx   . Hypotension Neg Hx   . Malignant hyperthermia Neg Hx   . Pseudochol deficiency Neg Hx     Social History Social History   Tobacco Use  . Smoking status: Former Research scientist (life sciences)  . Smokeless tobacco: Never Used  Vaping Use  . Vaping Use: Never used  Substance Use Topics  . Alcohol use: Yes    Comment: occasional  . Drug use: No     Allergies   Shellfish allergy, Sulfa antibiotics, and Adhesive [tape]   Review of Systems Review of Systems  All other systems reviewed and are negative.    Physical Exam Triage Vital Signs ED Triage Vitals  Enc Vitals Group     BP 06/25/20 1309 117/69     Pulse Rate 06/25/20 1309 89     Resp 06/25/20 1309 19     Temp 06/25/20 1309 98 F (36.7 C)     Temp Source 06/25/20 1309 Oral     SpO2 06/25/20 1309 98 %     Weight --      Height --      Head Circumference --      Peak Flow --      Pain Score 06/25/20 1308 10     Pain Loc --      Pain Edu? --      Excl. in [place]? --    No data found.  Updated Vital Signs BP 117/69 (BP Location: Left Arm)   Pulse 89   Temp 98 F (36.7 C) (Oral)   Resp 19   SpO2 98%   Visual Acuity Right Eye Distance:   Left Eye Distance:   Bilateral Distance:    Right Eye Near:   Left Eye Near:    Bilateral Near:     Physical Exam Vitals and nursing note reviewed.  Constitutional:      Appearance: She is well-developed and well-nourished.  HENT:     Head: Normocephalic.  Eyes:     Extraocular Movements: EOM normal.  Cardiovascular:     Rate and Rhythm: Normal rate.  Pulmonary:     Effort: Pulmonary effort is normal.  Abdominal:  General: There is  no distension.  Musculoskeletal:        General: Tenderness present. No swelling. Normal range of motion.     Cervical back: Normal range of motion.  Skin:    General: Skin is warm.  Neurological:     General: No focal deficit present.     Mental Status: She is alert and oriented to person, place, and time.  Psychiatric:        Mood and Affect: Mood and affect and mood normal.      UC Treatments / Results  Labs (all labs ordered are listed, but only abnormal results are displayed) Labs Reviewed - No data to display  EKG   Radiology No results found.  Procedures Procedures (including critical care time)  Medications Ordered in UC Medications - No data to display  Initial Impression / Assessment and Plan / UC Course  I have reviewed the triage vital signs and the nursing notes.  Pertinent labs & imaging results that were available during my care of the patient were reviewed by me and considered in my medical decision making (see chart for details).     MDM:  Pt given rx for prednisone and hydrocodone. Pt advsied to follow up with Dr.Harrison  Final Clinical Impressions(s) / UC Diagnoses   Final diagnoses:  Pain in joint of right shoulder     Discharge Instructions     See Dr. Aline Brochure for recheck.  Return if any problems.    ED Prescriptions    Medication Sig Dispense Auth. Provider   predniSONE (DELTASONE) 50 MG tablet One tablet a day 6 tablet Sofia, Leslie K, [place]   HYDROcodone-acetaminophen (NORCO/VICODIN) 5-325 MG tablet Take 1 tablet by mouth every 4 (four) hours as needed for moderate pain. 15 tablet Fransico Meadow, [place]     I have reviewed the PDMP during this encounter.  An After Visit Summary was printed and given to the patient.   Fransico Meadow, [place] 06/26/20 [phone]

## 2020-07-05 IMAGING — DX DG CHEST 1V PORT
2 series · 2 of 2 positions shown · non-contrast
Comparison: February 24, 2019.

CLINICAL DATA: Chest pain.

EXAM:
PORTABLE CHEST 1 VIEW

[chest ap (1 of 2)]
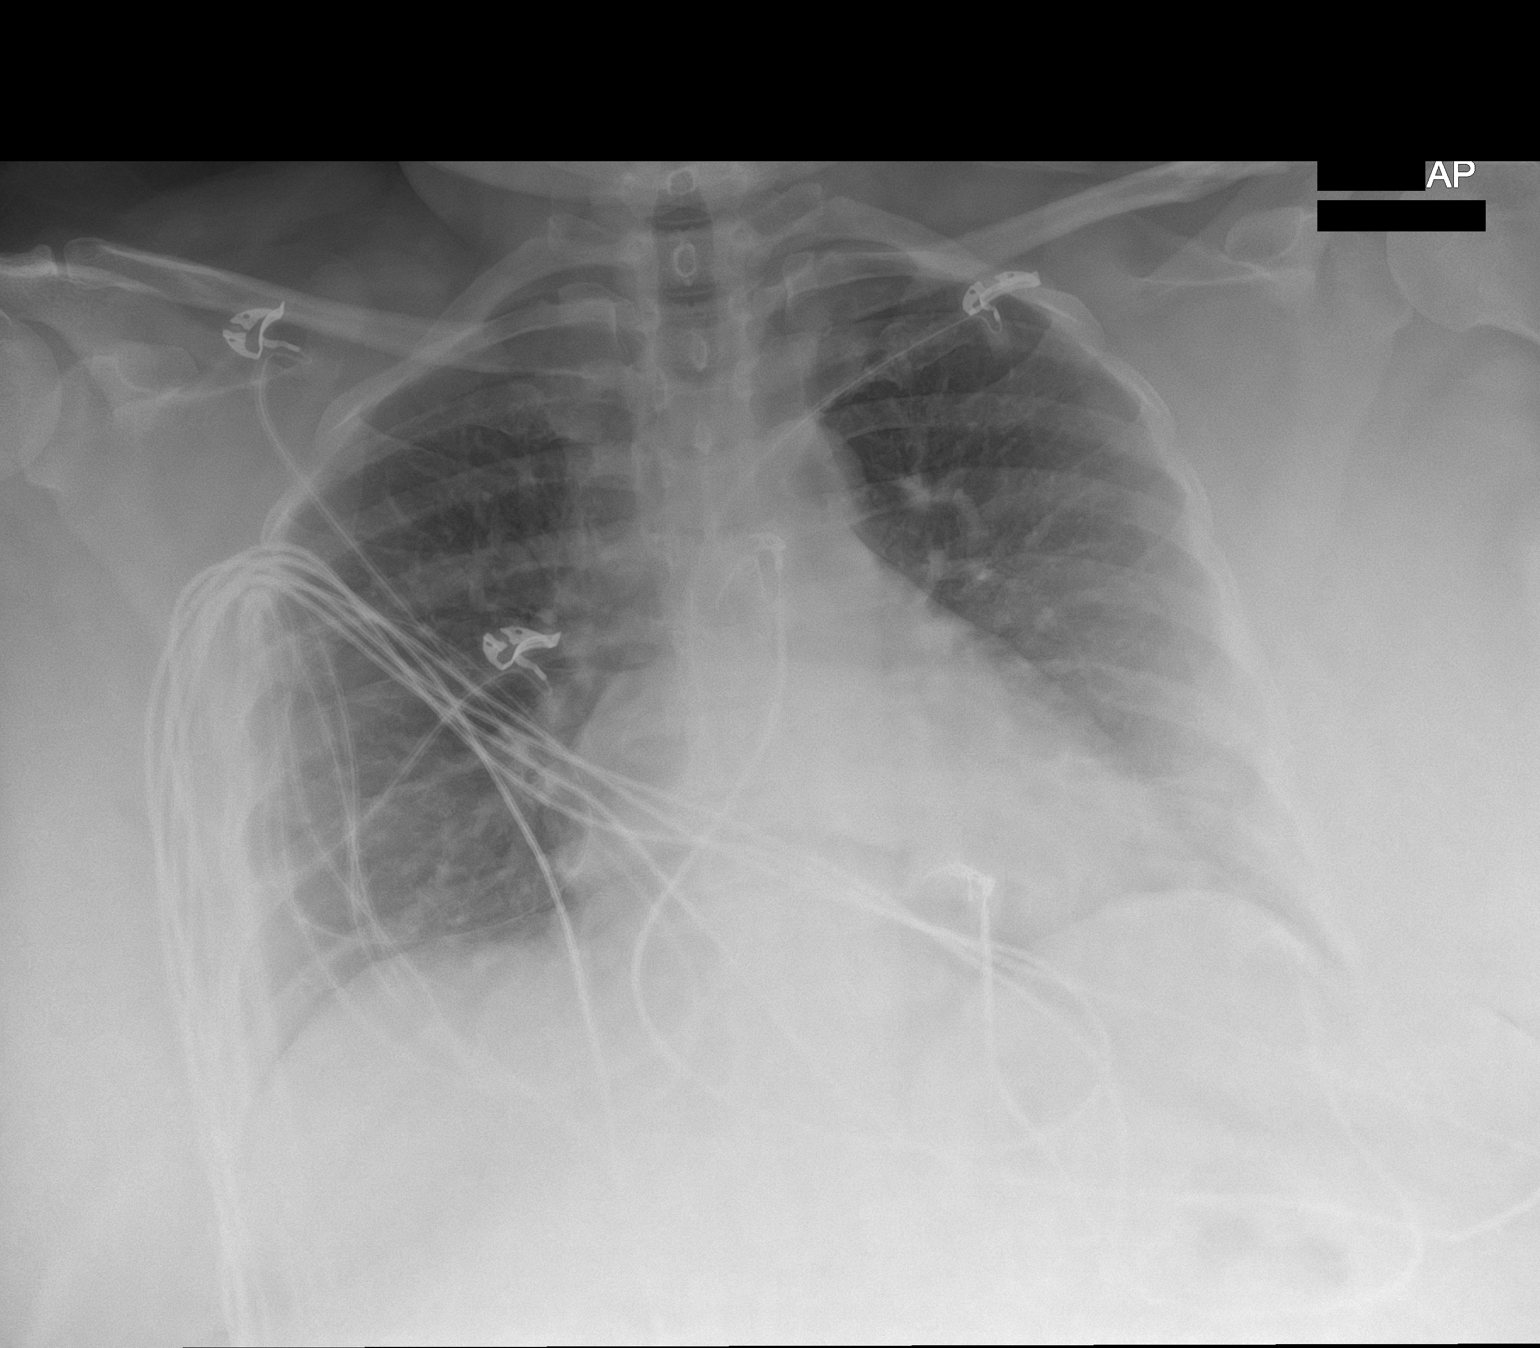

[chest ap (2 of 2)]
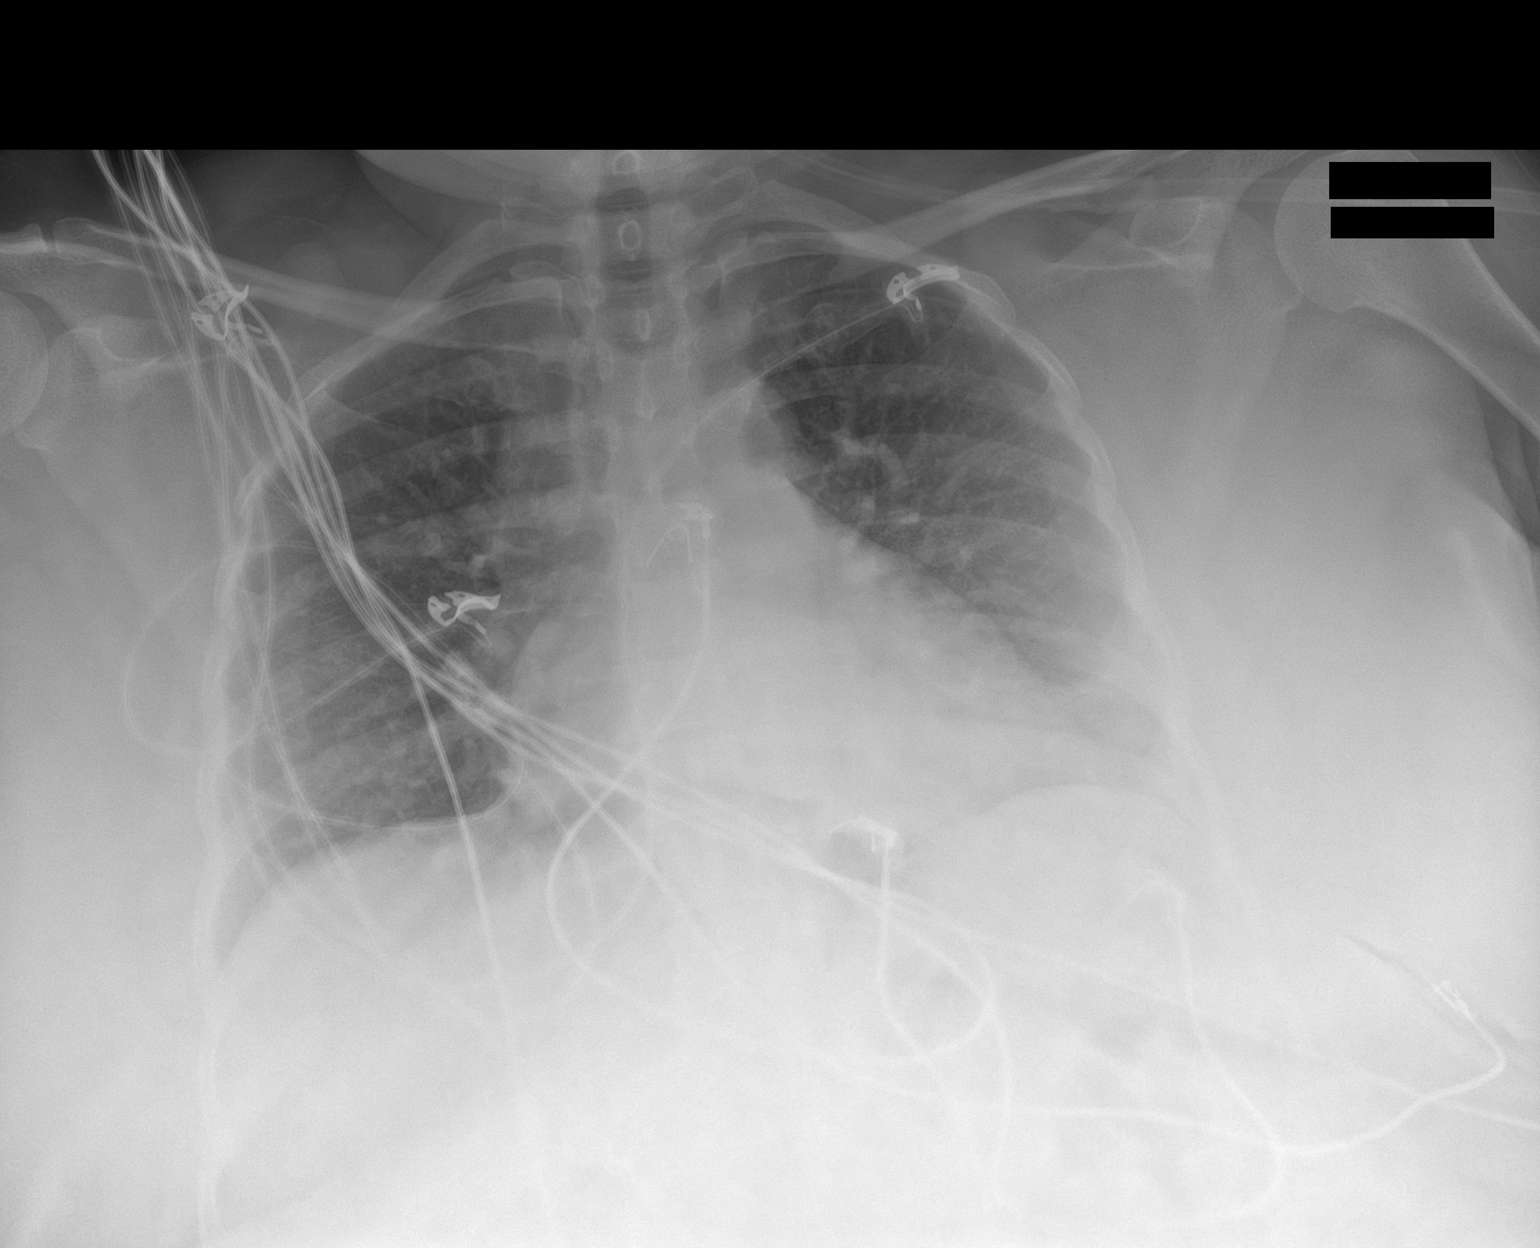

[2 of 2 positions shown; findings below may reference images not displayed]

FINDINGS: The heart size and mediastinal contours are within normal limits.
Both lungs are clear. No pneumothorax or pleural effusion is noted.
The visualized skeletal structures are unremarkable.
IMPRESSION: No active disease.

## 2020-07-07 ENCOUNTER — Encounter (HOSPITAL_COMMUNITY): Payer: Self-pay | Admitting: *Deleted

## 2020-07-07 ENCOUNTER — Other Ambulatory Visit: Payer: Self-pay

## 2020-07-07 ENCOUNTER — Emergency Department (HOSPITAL_COMMUNITY)
Admission: EM | Admit: 2020-07-07 | Discharge: 2020-07-07 | Disposition: A | Discharge status: Home / Self Care | Payer: 59 | Attending: Emergency Medicine | Admitting: Emergency Medicine

## 2020-07-07 ENCOUNTER — Emergency Department (HOSPITAL_COMMUNITY): Payer: 59

## 2020-07-07 ENCOUNTER — Ambulatory Visit (INDEPENDENT_AMBULATORY_CARE_PROVIDER_SITE_OTHER)
Admission: EM | Admit: 2020-07-07 | Discharge: 2020-07-07 | Disposition: A | Discharge status: Other Acute Inpatient Hospital | Payer: 59 | Source: Home / Self Care

## 2020-07-07 ENCOUNTER — Encounter: Payer: Self-pay | Admitting: Emergency Medicine

## 2020-07-07 DIAGNOSIS — R079 Chest pain, unspecified: Secondary | ICD-10-CM

## 2020-07-07 DIAGNOSIS — R9431 Abnormal electrocardiogram [ECG] [EKG]: Secondary | ICD-10-CM

## 2020-07-07 DIAGNOSIS — Z87891 Personal history of nicotine dependence: Secondary | ICD-10-CM | POA: Diagnosis not present

## 2020-07-07 DIAGNOSIS — R42 Dizziness and giddiness: Secondary | ICD-10-CM | POA: Insufficient documentation

## 2020-07-07 DIAGNOSIS — R0602 Shortness of breath: Secondary | ICD-10-CM | POA: Diagnosis not present

## 2020-07-07 DIAGNOSIS — Z7901 Long term (current) use of anticoagulants: Secondary | ICD-10-CM | POA: Insufficient documentation

## 2020-07-07 DIAGNOSIS — R072 Precordial pain: Secondary | ICD-10-CM | POA: Insufficient documentation

## 2020-07-07 DIAGNOSIS — I10 Essential (primary) hypertension: Secondary | ICD-10-CM | POA: Diagnosis not present

## 2020-07-07 DIAGNOSIS — Z79899 Other long term (current) drug therapy: Secondary | ICD-10-CM | POA: Diagnosis not present

## 2020-07-07 DIAGNOSIS — R110 Nausea: Secondary | ICD-10-CM | POA: Insufficient documentation

## 2020-07-07 LAB — BASIC METABOLIC PANEL
Anion gap: 9 (ref 5–15)
BUN: 13 mg/dL (ref 6–20)
CO2: 21 mmol/L — ABNORMAL LOW (ref 22–32)
Calcium: 8.8 mg/dL — ABNORMAL LOW (ref 8.9–10.3)
Chloride: 105 mmol/L (ref 98–111)
Creatinine, Ser: 0.73 mg/dL (ref 0.44–1.00)
GFR, Estimated: >60 mL/min (ref >60)
Glucose, Bld: 96 mg/dL (ref 70–99)
Potassium: 4.2 mmol/L (ref 3.5–5.1)
Sodium: 135 mmol/L (ref 135–145)

## 2020-07-07 LAB — CBC
HCT: 36.5 % (ref 36.0–46.0)
Hemoglobin: 11.5 g/dL — ABNORMAL LOW (ref 12.0–15.0)
MCH: 27.6 pg (ref 26.0–34.0)
MCHC: 31.5 g/dL (ref 30.0–36.0)
MCV: 87.5 fL (ref 80.0–100.0)
Platelets: 277 10*3/uL (ref 150–400)
RBC: 4.17 MIL/uL (ref 3.87–5.11)
RDW: 15 % (ref 11.5–15.5)
WBC: 6.6 10*3/uL (ref 4.0–10.5)
nRBC: 0 % (ref 0.0–0.2)

## 2020-07-07 LAB — POC URINE PREG, ED: Preg Test, Ur: NEGATIVE

## 2020-07-07 LAB — TROPONIN I (HIGH SENSITIVITY)
Troponin I (High Sensitivity): <2 ng/L (ref <18)
Troponin I (High Sensitivity): <2 ng/L (ref <18)

## 2020-07-07 MED ORDER — MECLIZINE HCL 25 MG PO TABS: 25.0000 mg | ORAL_TABLET | Freq: Three times a day (TID) | ORAL | 0 refills | Status: DC | PRN

## 2020-07-07 MED ORDER — MECLIZINE HCL 12.5 MG PO TABS
25.0000 mg | ORAL_TABLET | Freq: Once | ORAL | Status: AC
  Administered 2020-07-07: 25 mg via ORAL
  Filled 2020-07-07: qty 2

## 2020-07-07 NOTE — ED Triage Notes (Signed)
Dizzy, chest pain, has multiple complaints

## 2020-07-07 NOTE — ED Notes (Signed)
Jessica Sanchez advised patient needs to go to the ED based on symptoms and EKG.

## 2020-07-07 NOTE — ED Triage Notes (Signed)
Dizzy spells x 3 to 4 days.  Symptoms worse today feeling like she was going to pass out.  States she is having chest pain.

## 2020-07-07 NOTE — ED Notes (Signed)
Norm Salt, RN notified of patient coming to the ED.

## 2020-07-07 NOTE — Discharge Instructions (Signed)
You were seen in the emergency department for pain in your chest and dizziness.  You had blood work EKG and a chest x-ray that did not show any significant abnormalities.  Your symptoms somewhat improved with some meclizine and so we are prescribing you that.  Please keep well-hydrated.  Contact your primary care doctor for close follow-up.  Return to the emergency department if any worsening or concerning symptoms.

## 2020-07-07 NOTE — ED Notes (Signed)
Pt ambulated to bathroom and back and tolerated well.

## 2020-07-07 NOTE — ED Provider Notes (Signed)
[facility] Provider Note   CSN: [id number] Arrival date & time: 07/07/20  1821     History Chief Complaint  Patient presents with  . Chest Pain    Jessica Sanchez is a 35 y.o. female.  She has a history of hypertension and nonspecific chest pain.  She is coming in with 3 or 4 days of intermittent chest pain like an elephant sitting on her chest associated with some shortness of breath.  Comes and goes.  She is also been experiencing dizziness which she describes as room spinning.  Get some tunnel vision when it happens.  Usually occurs when she stands up.  No numbness or weakness, no blurry vision double vision.  No recent falls or head injuries.  The history is provided by the patient.  Chest Pain Pain location:  Substernal area Pain quality: pressure   Pain radiates to:  Does not radiate Pain severity:  Moderate Onset quality:  Gradual Timing:  Intermittent Progression:  Unchanged Chronicity:  Recurrent Relieved by:  Nothing Worsened by:  Nothing Ineffective treatments:  Rest Associated symptoms: dizziness, nausea and shortness of breath   Associated symptoms: no abdominal pain, no cough, no diaphoresis, no fever, no numbness and no weakness   Dizziness:    Severity:  Moderate   Duration:  4 days   Timing:  Intermittent   Progression:  Unchanged Risk factors: no smoking        Past Medical History:  Diagnosis Date  . Fibroid (bleeding) (uterine)   . Fibroid, uterine   . H/O metrorrhagia   . Hypertension   . IUD (intrauterine device) in place 07/11/2015  . Menorrhagia   . Obesity, Class III, BMI 40-49.9 (morbid obesity) ([place]) 11/11/2018  . SVT (supraventricular tachycardia) [facility])     Patient Active Problem List   Diagnosis Date Noted  . Elevated troponin   . Attempted IUD removal, unsuccessful 07/05/2019  . Encounter for IUD insertion 07/05/2019  . Gastroesophageal reflux disease   . Nonspecific chest pain 11/11/2018  . Morbid obesity with  body mass index (BMI) of 60.0 to 69.9 in adult ([place]) 11/11/2018  . Elevated d-dimer 11/11/2018  . SVT (supraventricular tachycardia) ([place]) 10/04/2018  . IUD (intrauterine device) in place 07/11/2015  . Other disorder of menstruation and other abnormal bleeding from female genital tract 10/30/2012    Past Surgical History:  Procedure Laterality Date  . HYSTEROSCOPY WITH D & C N/A 09/27/2013   Procedure: DILATATION AND CURETTAGE /HYSTEROSCOPY and insertion of Mirena IUD ;  Surgeon: [name]. Harrington Challenger, MD;  Location: [facility];  Service: Gynecology;  Laterality: N/A;  . MYOMECTOMY  02/03/2011   Procedure: MYOMECTOMY;  Surgeon: [name], MD;  Location: [facility];  Service: Gynecology;  Laterality: N/A;  . SVT ABLATION N/A 11/08/2018   Procedure: SVT ABLATION;  Surgeon: [name], MD;  Location: [facility];  Service: Cardiovascular;  Laterality: N/A;  . TONSILLECTOMY       OB History    Gravida  1   Para      Term      Preterm      AB  1   Living        SAB  1   IAB      Ectopic      Multiple      Live Births              Family History  Problem Relation Age of Onset  .  Cirrhosis Mother   . Diabetes Mother   . Cancer Maternal Grandfather   . Hypertension Sister   . Diabetes Sister   . Anesthesia problems Neg Hx   . Hypotension Neg Hx   . Malignant hyperthermia Neg Hx   . Pseudochol deficiency Neg Hx     Social History   Tobacco Use  . Smoking status: Former Research scientist (life sciences)  . Smokeless tobacco: Never Used  Vaping Use  . Vaping Use: Never used  Substance Use Topics  . Alcohol use: Yes    Comment: occasional  . Drug use: No    Home Medications Prior to Admission medications   Medication Sig Start Date End Date Taking? Authorizing Provider  acetaminophen (TYLENOL) 325 MG tablet Take 650 mg by mouth every 6 (six) hours as needed for headache.    [provider]  albuterol (PROVENTIL HFA) 108 (90 Base) MCG/ACT inhaler Inhale 1-2 puffs into the  lungs every 6 (six) hours as needed for wheezing or shortness of breath.     [provider]  diazepam (VALIUM) 5 MG tablet Take 5 mg by mouth once. 06/19/20   [provider]  diltiazem (CARDIZEM CD) 240 MG 24 hr capsule Take 1 capsule (240 mg total) by mouth daily. Patient not taking: Reported on 06/26/2020 06/14/19 06/08/20  Erma Heritage, PA-C  flecainide (TAMBOCOR) 150 MG tablet Take 0.5 tablets (75 mg total) by mouth 2 (two) times daily. 10/30/19   [name], MD  HYDROcodone-acetaminophen (NORCO/VICODIN) 5-325 MG tablet Take 1 tablet by mouth every 4 (four) hours as needed for moderate pain. 06/25/20 06/25/21  Fransico Meadow, PA-C  ibuprofen (ADVIL) 800 MG tablet Take 1 tablet (800 mg total) by mouth 3 (three) times daily. Take with food 06/03/20   Triplett, Tammy, PA-C  Multiple Vitamins-Calcium (ONE-A-DAY WOMENS PO) Take 1 tablet by mouth daily.    [provider]  pantoprazole (PROTONIX) 20 MG tablet Take 1 tablet (20 mg total) by mouth daily for 7 days. 06/26/20 07/03/20  Caccavale, Sophia, PA-C  predniSONE (DELTASONE) 50 MG tablet One tablet a day 06/25/20   Caryl Ada K, PA-C  traMADol (ULTRAM) 50 MG tablet Take 1 tablet (50 mg total) by mouth every 6 (six) hours as needed. 06/03/20   Triplett, Tammy, PA-C  apixaban (ELIQUIS) 5 MG TABS tablet Take 2 tablets (10mg ) twice daily for 7 days, then 1 tablet (5mg ) twice daily 04/20/19 04/20/19  Rodell Perna A, PA-C  levonorgestrel (MIRENA) 20 MCG/24HR IUD 1 each by Intrauterine route once.   06/25/20  [provider]    Allergies    Shellfish allergy, Sulfa antibiotics, and Adhesive [tape]  Review of Systems   Review of Systems  Constitutional: Negative for diaphoresis and fever.  HENT: Negative for sore throat.   Eyes: Negative for visual disturbance.  Respiratory: Positive for shortness of breath. Negative for cough.   Cardiovascular: Positive for chest pain.  Gastrointestinal: Positive for nausea.  Negative for abdominal pain.  Genitourinary: Negative for dysuria.  Musculoskeletal: Negative for neck pain.  Skin: Negative for rash.  Neurological: Positive for dizziness and light-headedness. Negative for speech difficulty, weakness and numbness.    Physical Exam Updated Vital Signs BP (!) 120/48   Pulse 91   Temp 98.1 F (36.7 C) (Oral)   Resp 18   SpO2 96%   Physical Exam Vitals and nursing note reviewed.  Constitutional:      General: She is not in acute distress.    Appearance: Normal  appearance. She is well-developed and well-nourished. She is obese.  HENT:     Head: Normocephalic and atraumatic.  Eyes:     Conjunctiva/sclera: Conjunctivae normal.  Cardiovascular:     Rate and Rhythm: Normal rate and regular rhythm.     Heart sounds: No murmur heard.   Pulmonary:     Effort: Pulmonary effort is normal. No respiratory distress.     Breath sounds: Normal breath sounds.  Abdominal:     Palpations: Abdomen is soft.     Tenderness: There is no abdominal tenderness.  Musculoskeletal:        General: No tenderness or edema. Normal range of motion.     Cervical back: Neck supple.     Right lower leg: No tenderness.     Left lower leg: No tenderness.  Skin:    General: Skin is warm and dry.     Capillary Refill: Capillary refill takes less than 2 seconds.  Neurological:     General: No focal deficit present.     Mental Status: She is alert.     Cranial Nerves: No cranial nerve deficit.     Sensory: No sensory deficit.     Motor: No weakness.  Psychiatric:        Mood and Affect: Mood and affect normal.     ED Results / Procedures / Treatments   Labs (all labs ordered are listed, but only abnormal results are displayed) Labs Reviewed  BASIC METABOLIC PANEL - Abnormal; Notable for the following components:      Result Value   CO2 21 (*)    Calcium 8.8 (*)    All other components within normal limits  CBC - Abnormal; Notable for the following components:    Hemoglobin 11.5 (*)    All other components within normal limits  POC URINE PREG, ED  TROPONIN I (HIGH SENSITIVITY)  TROPONIN I (HIGH SENSITIVITY)    EKG None  Radiology No results found.  Procedures Procedures   Medications Ordered in ED Medications  meclizine (ANTIVERT) tablet 25 mg (has no administration in time range)    ED Course  I have reviewed the triage vital signs and the nursing notes.  Pertinent labs & imaging results that were available during my care of the patient were reviewed by me and considered in my medical decision making (see chart for details).  Clinical Course as of 07/08/20 0955  Mon Jul 07, 2020  1911 EKG not crossing in epic.  Normal sinus rhythm rate of 90 normal intervals nonspecific T wave flattening.  No significant change from prior. [MB]  2158 She was able to ambulate back from the bathroom without any difficulty.  She said she still felt a little dizzy but it was better.  Recommended discharge and will prescribe her prescription of meclizine.  Recommended close PCP follow-up.  Return instructions discussed. [MB]    Clinical Course User Index [MB] Hayden Rasmussen, MD   MDM Rules/Calculators/A&P                         This patient complains of dizziness and chest pain this involves an extensive number of treatment Options and is a complaint that carries with it a high risk of complications and Morbidity. The differential includes vertigo, hypovolemia, metabolic derangement, ACS pneumonia, anemia  I ordered, reviewed and interpreted labs, which included CBC with normal white count, stable hemoglobin, chemistries with mildly low bicarb, normal renal function, troponins flat, pregnancy  negative I ordered medication oral meclizine Previous records obtained and reviewed in epic including prior cardiac work-ups  After the interventions stated above, I reevaluated the patient and found patient to be symptomatically improved.  Hemodynamic stable.   No evidence of ACS.  Will trial on oral meclizine, and follow-up with PCP.  Return instructions discussed.   Final Clinical Impression(s) / ED Diagnoses Final diagnoses:  Nonspecific chest pain  Dizziness    Rx / DC Orders ED Discharge Orders         Ordered    meclizine (ANTIVERT) 25 MG tablet  3 times daily PRN        07/07/20 2159           Hayden Rasmussen, MD 07/08/20 [phone]

## 2020-07-10 ENCOUNTER — Other Ambulatory Visit: Payer: 59

## 2020-07-15 ENCOUNTER — Ambulatory Visit (INDEPENDENT_AMBULATORY_CARE_PROVIDER_SITE_OTHER): Payer: 59

## 2020-07-15 ENCOUNTER — Encounter: Payer: Self-pay | Admitting: Emergency Medicine

## 2020-07-15 ENCOUNTER — Other Ambulatory Visit: Payer: Self-pay

## 2020-07-15 ENCOUNTER — Ambulatory Visit
Admission: EM | Admit: 2020-07-15 | Discharge: 2020-07-15 | Disposition: A | Discharge status: Home / Self Care | Payer: 59 | Attending: Emergency Medicine | Admitting: Emergency Medicine

## 2020-07-15 DIAGNOSIS — W231XXA Caught, crushed, jammed, or pinched between stationary objects, initial encounter: Secondary | ICD-10-CM | POA: Diagnosis not present

## 2020-07-15 DIAGNOSIS — S6991XA Unspecified injury of right wrist, hand and finger(s), initial encounter: Secondary | ICD-10-CM | POA: Diagnosis not present

## 2020-07-15 DIAGNOSIS — M79644 Pain in right finger(s): Secondary | ICD-10-CM

## 2020-07-15 DIAGNOSIS — S67192A Crushing injury of right middle finger, initial encounter: Secondary | ICD-10-CM

## 2020-07-15 MED ORDER — MELOXICAM 15 MG PO TABS: 15.0000 mg | ORAL_TABLET | Freq: Every day | ORAL | 0 refills | Status: DC

## 2020-07-15 NOTE — ED Provider Notes (Signed)
Jessica Sanchez   [id number] 07/15/20 Arrival Time: 5643  CC: finger PAIN  SUBJECTIVE: History from: patient. Jessica Sanchez is a 35 y.o. female complains of RT middle finger pain and injury x 1 day.  Smashed finger in car door.  Localizes the pain to the middle finger.  Describes the pain as constant and throbbing in character.  Has tried OTC medications without relief.  Symptoms are made worse with ROM.  Hx of infection in finger in the past.  Complains of swelling.  Denies fever, chills, erythema, ecchymosis, weakness.     ROS: As per HPI.  All other pertinent ROS negative.     Past Medical History:  Diagnosis Date  . Fibroid (bleeding) (uterine)   . Fibroid, uterine   . H/O metrorrhagia   . Hypertension   . IUD (intrauterine device) in place 07/11/2015  . Menorrhagia   . Obesity, Class III, BMI 40-49.9 (morbid obesity) ([place]) 11/11/2018  . SVT (supraventricular tachycardia) ([facility])    Past Surgical History:  Procedure Laterality Date  . HYSTEROSCOPY WITH D & C N/A 09/27/2013   Procedure: DILATATION AND CURETTAGE /HYSTEROSCOPY and insertion of Mirena IUD ;  Surgeon: [name]. Harrington Challenger, MD;  Location: [facility];  Service: Gynecology;  Laterality: N/A;  . MYOMECTOMY  02/03/2011   Procedure: MYOMECTOMY;  Surgeon: [name], MD;  Location: [facility];  Service: Gynecology;  Laterality: N/A;  . SVT ABLATION N/A 11/08/2018   Procedure: SVT ABLATION;  Surgeon: [name], MD;  Location: [facility];  Service: Cardiovascular;  Laterality: N/A;  . TONSILLECTOMY     Allergies  Allergen Reactions  . Shellfish Allergy Shortness Of Breath, Itching and Swelling    Mouth became swollen, throat started itching, and developed shortness of breath  . Sulfa Antibiotics Itching, Shortness Of Breath and Swelling    Mouth became swollen, throat started itching, and developed shortness of breath  . Adhesive [Tape] Itching    Depends on the type of medical tape   No current  facility-administered medications on file prior to encounter.   Current Outpatient Medications on File Prior to Encounter  Medication Sig Dispense Refill  . acetaminophen (TYLENOL) 325 MG tablet Take 650 mg by mouth every 6 (six) hours as needed for headache.    . albuterol (PROVENTIL HFA) 108 (90 Base) MCG/ACT inhaler Inhale 1-2 puffs into the lungs every 6 (six) hours as needed for wheezing or shortness of breath.     . diazepam (VALIUM) 5 MG tablet Take 5 mg by mouth once.    . diltiazem (CARDIZEM CD) 240 MG 24 hr capsule Take 1 capsule (240 mg total) by mouth daily. (Patient not taking: Reported on 06/26/2020) 90 capsule 3  . flecainide (TAMBOCOR) 150 MG tablet Take 0.5 tablets (75 mg total) by mouth 2 (two) times daily. 90 tablet 1  . HYDROcodone-acetaminophen (NORCO/VICODIN) 5-325 MG tablet Take 1 tablet by mouth every 4 (four) hours as needed for moderate pain. 15 tablet 0  . ibuprofen (ADVIL) 800 MG tablet Take 1 tablet (800 mg total) by mouth 3 (three) times daily. Take with food 21 tablet 0  . meclizine (ANTIVERT) 25 MG tablet Take 1 tablet (25 mg total) by mouth 3 (three) times daily as needed for dizziness. 30 tablet 0  . Multiple Vitamins-Calcium (ONE-A-DAY WOMENS PO) Take 1 tablet by mouth daily.    . pantoprazole (PROTONIX) 20 MG tablet Take 1 tablet (20 mg total) by mouth daily for 7 days. 7  tablet 0  . predniSONE (DELTASONE) 50 MG tablet One tablet a day 6 tablet 0  . traMADol (ULTRAM) 50 MG tablet Take 1 tablet (50 mg total) by mouth every 6 (six) hours as needed. 10 tablet 0  . [DISCONTINUED] apixaban (ELIQUIS) 5 MG TABS tablet Take 2 tablets (10mg ) twice daily for 7 days, then 1 tablet (5mg ) twice daily 20 tablet 0  . [DISCONTINUED] levonorgestrel (MIRENA) 20 MCG/24HR IUD 1 each by Intrauterine route once.      Social History   Socioeconomic History  . Marital status: Single    Spouse name: Not on file  . Number of children: Not on file  . Years of education: Not on file   . Highest education level: Not on file  Occupational History  . Not on file  Tobacco Use  . Smoking status: Former Research scientist (life sciences)  . Smokeless tobacco: Never Used  Vaping Use  . Vaping Use: Never used  Substance and Sexual Activity  . Alcohol use: Yes    Comment: occasional  . Drug use: No  . Sexual activity: Yes    Birth control/protection: I.U.D.  Other Topics Concern  . Not on file  Social History Narrative  . Not on file   Social Determinants of Health   Financial Resource Strain: Not on file  Food Insecurity: Not on file  Transportation Needs: Not on file  Physical Activity: Not on file  Stress: Not on file  Social Connections: Not on file  Intimate Partner Violence: Not on file   Family History  Problem Relation Age of Onset  . Cirrhosis Mother   . Diabetes Mother   . Cancer Maternal Grandfather   . Hypertension Sister   . Diabetes Sister   . Anesthesia problems Neg Hx   . Hypotension Neg Hx   . Malignant hyperthermia Neg Hx   . Pseudochol deficiency Neg Hx     OBJECTIVE:  Vitals:   07/15/20 1133  BP: 112/78  Pulse: 92  Resp: 18  Temp: 98.3 F (36.8 C)  TempSrc: Oral  SpO2: 96%    General appearance: ALERT; in no acute distress.  Head: NCAT Lungs: Normal respiratory effort CV: Radial pulse 2+ Musculoskeletal: RT hand  Inspection: Skin warm, dry, clear and intact without obvious erythema, effusion, or ecchymosis.  Palpation: TTP over distal third digit ROM: LROM Strength: deferred Skin: warm and dry Neurologic: Ambulates without difficulty; Sensation intact about the upper extremities Psychological: alert and cooperative; normal mood and affect  DIAGNOSTIC STUDIES:  DG Hand Complete Right  Result Date: 07/15/2020 CLINICAL DATA:  Hand caught in car door EXAM: RIGHT HAND - COMPLETE 3+ VIEW COMPARISON:  None. FINDINGS: Frontal, oblique, and lateral views were obtained. No fracture or dislocation. Joint spaces appear normal. No erosive change.  IMPRESSION: No fracture or dislocation.  No appreciable arthropathy. Electronically Signed   By: Lowella Grip III M.D.   On: 07/15/2020 11:51    X-rays negative for bony abnormalities including fracture, or dislocation.  No soft tissue swelling.    I have reviewed the x-rays myself and the radiologist interpretation. I am in agreement with the radiologist interpretation.     ASSESSMENT & PLAN:  1. Pain of right middle finger   2. Crushing injury of right middle finger, initial encounter      Meds ordered this encounter  Medications  . meloxicam (MOBIC) 15 MG tablet    Sig: Take 1 tablet (15 mg total) by mouth daily.    Dispense:  30  tablet    Refill:  0    Order Specific Question:   Supervising Provider    Answer:   Raylene Everts [[id number]]   X-rays negative for fracture or dislocation Continue conservative management of rest, ice, and elevation Splint applied Take mobic as needed for pain relief (may cause abdominal discomfort, ulcers, and GI bleeds avoid taking with other NSAIDs) Follow up with PCP if symptoms persist Return or go to the ER if you have any new or worsening symptoms (fever, chills, chest pain, redness, swelling, drainage, fever, nausea, vomiting, etc...)    Reviewed expectations re: course of current medical issues. Questions answered. Outlined signs and symptoms indicating need for more acute intervention. Patient verbalized understanding. After Visit Summary given.    Lestine Box, PA-C 07/15/20 1208

## 2020-07-15 NOTE — ED Notes (Signed)
Correction to previous note.  It is right middle finger

## 2020-07-15 NOTE — Discharge Instructions (Signed)
X-rays negative for fracture or dislocation Continue conservative management of rest, ice, and elevation Splint applied Take mobic as needed for pain relief (may cause abdominal discomfort, ulcers, and GI bleeds avoid taking with other NSAIDs) Follow up with PCP if symptoms persist Return or go to the ER if you have any new or worsening symptoms (fever, chills, chest pain, redness, swelling, drainage, fever, nausea, vomiting, etc...)

## 2020-07-15 NOTE — ED Triage Notes (Signed)
Smashed right ring finger in car door yesterday.

## 2020-07-21 ENCOUNTER — Other Ambulatory Visit: Payer: Self-pay

## 2020-07-21 ENCOUNTER — Ambulatory Visit
Admission: EM | Admit: 2020-07-21 | Discharge: 2020-07-21 | Disposition: A | Discharge status: Home / Self Care | Payer: 59 | Attending: Physician Assistant | Admitting: Physician Assistant

## 2020-07-21 ENCOUNTER — Encounter: Payer: Self-pay | Admitting: Emergency Medicine

## 2020-07-21 DIAGNOSIS — Z20822 Contact with and (suspected) exposure to covid-19: Secondary | ICD-10-CM

## 2020-07-21 NOTE — ED Triage Notes (Signed)
Here for covid test only 

## 2020-07-22 LAB — NOVEL CORONAVIRUS, NAA: SARS-CoV-2, NAA: NOT DETECTED

## 2020-07-22 LAB — SARS-COV-2, NAA 2 DAY TAT

## 2020-07-30 ENCOUNTER — Ambulatory Visit
Admission: RE | Admit: 2020-07-30 | Discharge: 2020-07-30 | Disposition: A | Discharge status: Home / Self Care | Payer: 59 | Source: Ambulatory Visit | Attending: Orthopedic Surgery | Admitting: Orthopedic Surgery

## 2020-07-30 ENCOUNTER — Ambulatory Visit
Admission: RE | Admit: 2020-07-30 | Discharge: 2020-07-30 | Disposition: A | Discharge status: Home / Self Care | Payer: 59 | Source: Ambulatory Visit

## 2020-07-30 DIAGNOSIS — M75101 Unspecified rotator cuff tear or rupture of right shoulder, not specified as traumatic: Secondary | ICD-10-CM

## 2020-08-04 ENCOUNTER — Other Ambulatory Visit: Payer: Self-pay | Admitting: Orthopedic Surgery

## 2020-08-04 ENCOUNTER — Telehealth: Payer: Self-pay | Admitting: Orthopedic Surgery

## 2020-08-04 ENCOUNTER — Other Ambulatory Visit: Payer: Self-pay

## 2020-08-04 DIAGNOSIS — M75101 Unspecified rotator cuff tear or rupture of right shoulder, not specified as traumatic: Secondary | ICD-10-CM

## 2020-08-04 NOTE — Telephone Encounter (Signed)
She needs to come in please.

## 2020-08-04 NOTE — Telephone Encounter (Signed)
Jessica Sanchez called today and stated that she had her MRI done on Wednesday, 07/30/20.  She wants to know if she needs to come in for results or if she can get those over the phone.  Please advise  Thanks

## 2020-08-06 ENCOUNTER — Telehealth: Payer: Self-pay

## 2020-08-06 NOTE — Telephone Encounter (Signed)
LMOM per Dr Posey Pronto we will not accept this patient at [facility].  BZD-pain meds

## 2020-08-07 ENCOUNTER — Encounter: Payer: Self-pay | Admitting: Advanced Practice Midwife

## 2020-08-07 ENCOUNTER — Ambulatory Visit (INDEPENDENT_AMBULATORY_CARE_PROVIDER_SITE_OTHER): Payer: 59 | Admitting: Advanced Practice Midwife

## 2020-08-07 ENCOUNTER — Other Ambulatory Visit: Payer: Self-pay

## 2020-08-07 VITALS — BP 115/73 | HR 90 | Ht 63.0 in | Wt 326.5 lb

## 2020-08-07 DIAGNOSIS — Z975 Presence of (intrauterine) contraceptive device: Secondary | ICD-10-CM

## 2020-08-07 DIAGNOSIS — N6310 Unspecified lump in the right breast, unspecified quadrant: Secondary | ICD-10-CM | POA: Diagnosis not present

## 2020-08-07 NOTE — Patient Instructions (Signed)
Mammogram 4/26 at 10:40 (arrive at 10:25) at [facility].  No deodorant/powders/spray.

## 2020-08-07 NOTE — Progress Notes (Signed)
[facility] Visit  Patient name: KRISTIANE MORSCH MRN [id number]  Date of birth: May 25, 1985  CC & HPI:  Jessica Sanchez is a 35 y.o. African American female presenting today for IUD removal and breast lump.  Had IUD placed, couldn't find old one to remove it (our [place] didn't find it)  Went ot hospital 10/5 and [place] there didn't see it, but CT did see it.  Wants them both removed d/t HAs, cramps.  Has fibroids, has had 14 of them removed in 2012.  May want to get pregnant at some point.    Noticed a breast lump in right breast, "just feels different"   Pertinent History Reviewed:  Medical & Surgical Hx:   Past Medical History:  Diagnosis Date  . Fibroid (bleeding) (uterine)   . Fibroid, uterine   . H/O metrorrhagia   . Hypertension   . IUD (intrauterine device) in place 07/11/2015  . Menorrhagia   . Obesity, Class III, BMI 40-49.9 (morbid obesity) ([place]) 11/11/2018  . SVT (supraventricular tachycardia) ([facility])    Past Surgical History:  Procedure Laterality Date  . HYSTEROSCOPY WITH D & C N/A 09/27/2013   Procedure: DILATATION AND CURETTAGE /HYSTEROSCOPY and insertion of Mirena IUD ;  Surgeon: [name]. Harrington Challenger, MD;  Location: [facility];  Service: Gynecology;  Laterality: N/A;  . MYOMECTOMY  02/03/2011   Procedure: MYOMECTOMY;  Surgeon: [name], MD;  Location: [facility];  Service: Gynecology;  Laterality: N/A;  . SVT ABLATION N/A 11/08/2018   Procedure: SVT ABLATION;  Surgeon: [name], MD;  Location: [facility];  Service: Cardiovascular;  Laterality: N/A;  . TONSILLECTOMY     Family History  Problem Relation Age of Onset  . Cirrhosis Mother   . Diabetes Mother   . Cancer Maternal Grandfather   . Hypertension Sister   . Diabetes Sister   . Anesthesia problems Neg Hx   . Hypotension Neg Hx   . Malignant hyperthermia Neg Hx   . Pseudochol deficiency Neg Hx     Current Outpatient Medications:  .  acetaminophen (TYLENOL) 325 MG tablet, Take 650 mg by mouth every 6 (six)  hours as needed for headache., Disp: , Rfl:  .  albuterol (PROVENTIL HFA) 108 (90 Base) MCG/ACT inhaler, Inhale 1-2 puffs into the lungs every 6 (six) hours as needed for wheezing or shortness of breath. , Disp: , Rfl:  .  flecainide (TAMBOCOR) 150 MG tablet, Take 0.5 tablets (75 mg total) by mouth 2 (two) times daily., Disp: 90 tablet, Rfl: 1 .  Multiple Vitamins-Calcium (ONE-A-DAY WOMENS PO), Take 1 tablet by mouth daily., Disp: , Rfl:  .  diltiazem (CARDIZEM CD) 240 MG 24 hr capsule, Take 1 capsule (240 mg total) by mouth daily. (Patient not taking: Reported on 06/26/2020), Disp: 90 capsule, Rfl: 3 .  pantoprazole (PROTONIX) 20 MG tablet, Take 1 tablet (20 mg total) by mouth daily for 7 days., Disp: 7 tablet, Rfl: 0 Social History: Reviewed -  reports that she has quit smoking. She has never used smokeless tobacco.  Review of Systems:   Constitutional: Negative for fever and chills Eyes: Negative for visual disturbances Respiratory: Negative for shortness of breath, dyspnea Cardiovascular: Negative for chest pain or palpitations  Gastrointestinal: Negative for vomiting, diarrhea and constipationGenitourinary: Negative for dysuria and urgency, vaginal irritation or itching Musculoskeletal: Negative for back pain, joint pain, myalgias  Neurological: Negative for dizziness    Objective Findings:    Physical Examination: Vitals:  08/07/20 1338  BP: 115/73  Pulse: 90   General appearance - well appearing, and in no distress Mental status - alert, oriented to person, place, and time Chest:  Normal respiratory effort Heart - normal rate and regular rhythm Breast:  ? Thickening along right breast near nipple Abdomen:  Soft, nontender Pelvic: no strings visible Musculoskeletal:  Normal range of motion without pain Extremities:  No edema    No results found for this or any previous visit (from the past 24 hour(s)).    Assessment & Plan:  A/P:   ? Breast thickening per  pt--Mammo/US scheduled for 08/26/20 @ 1040am (arrive at 1025)  IUD removals under general 5/18 per [facility]--note sent to da[facility]     No follow-ups on file.  Christin Fudge CNM 08/07/2020 2:27 PM

## 2020-08-09 ENCOUNTER — Emergency Department (HOSPITAL_COMMUNITY)
Admission: EM | Admit: 2020-08-09 | Discharge: 2020-08-09 | Disposition: A | Discharge status: Home / Self Care | Payer: 59 | Attending: Emergency Medicine | Admitting: Emergency Medicine

## 2020-08-09 ENCOUNTER — Other Ambulatory Visit: Payer: Self-pay

## 2020-08-09 ENCOUNTER — Encounter (HOSPITAL_COMMUNITY): Payer: Self-pay | Admitting: Emergency Medicine

## 2020-08-09 ENCOUNTER — Emergency Department (HOSPITAL_COMMUNITY): Payer: 59

## 2020-08-09 DIAGNOSIS — I10 Essential (primary) hypertension: Secondary | ICD-10-CM | POA: Diagnosis not present

## 2020-08-09 DIAGNOSIS — R079 Chest pain, unspecified: Secondary | ICD-10-CM | POA: Insufficient documentation

## 2020-08-09 DIAGNOSIS — R000 Tachycardia, unspecified: Secondary | ICD-10-CM | POA: Diagnosis not present

## 2020-08-09 DIAGNOSIS — Z87891 Personal history of nicotine dependence: Secondary | ICD-10-CM | POA: Diagnosis not present

## 2020-08-09 DIAGNOSIS — R002 Palpitations: Secondary | ICD-10-CM | POA: Insufficient documentation

## 2020-08-09 DIAGNOSIS — Z20822 Contact with and (suspected) exposure to covid-19: Secondary | ICD-10-CM | POA: Insufficient documentation

## 2020-08-09 LAB — COMPREHENSIVE METABOLIC PANEL
ALT: 15 U/L (ref 0–44)
AST: 12 U/L — ABNORMAL LOW (ref 15–41)
Albumin: 3.9 g/dL (ref 3.5–5.0)
Alkaline Phosphatase: 56 U/L (ref 38–126)
Anion gap: 10 (ref 5–15)
BUN: 12 mg/dL (ref 6–20)
CO2: 23 mmol/L (ref 22–32)
Calcium: 9.1 mg/dL (ref 8.9–10.3)
Chloride: 104 mmol/L (ref 98–111)
Creatinine, Ser: 0.62 mg/dL (ref 0.44–1.00)
GFR, Estimated: >60 mL/min (ref >60)
Glucose, Bld: 85 mg/dL (ref 70–99)
Potassium: 4 mmol/L (ref 3.5–5.1)
Sodium: 137 mmol/L (ref 135–145)
Total Bilirubin: 0.8 mg/dL (ref 0.3–1.2)
Total Protein: 7.4 g/dL (ref 6.5–8.1)

## 2020-08-09 LAB — CBC WITH DIFFERENTIAL/PLATELET
Abs Immature Granulocytes: 0.01 10*3/uL (ref 0.00–0.07)
Basophils Absolute: 0 10*3/uL (ref 0.0–0.1)
Basophils Relative: 1 %
Eosinophils Absolute: 0.1 10*3/uL (ref 0.0–0.5)
Eosinophils Relative: 2 %
HCT: 38.6 % (ref 36.0–46.0)
Hemoglobin: 12.5 g/dL (ref 12.0–15.0)
Immature Granulocytes: 0 %
Lymphocytes Relative: 30 %
Lymphs Abs: 1.7 10*3/uL (ref 0.7–4.0)
MCH: 27.7 pg (ref 26.0–34.0)
MCHC: 32.4 g/dL (ref 30.0–36.0)
MCV: 85.6 fL (ref 80.0–100.0)
Monocytes Absolute: 0.6 10*3/uL (ref 0.1–1.0)
Monocytes Relative: 11 %
Neutro Abs: 3.1 10*3/uL (ref 1.7–7.7)
Neutrophils Relative %: 56 %
Platelets: 292 10*3/uL (ref 150–400)
RBC: 4.51 MIL/uL (ref 3.87–5.11)
RDW: 14.6 % (ref 11.5–15.5)
WBC: 5.5 10*3/uL (ref 4.0–10.5)
nRBC: 0 % (ref 0.0–0.2)

## 2020-08-09 LAB — TROPONIN I (HIGH SENSITIVITY)
Troponin I (High Sensitivity): <2 ng/L (ref <18)
Troponin I (High Sensitivity): 2 ng/L (ref <18)

## 2020-08-09 LAB — RESP PANEL BY RT-PCR (FLU A&B, COVID) ARPGX2
Influenza A by PCR: NEGATIVE
Influenza B by PCR: NEGATIVE
SARS Coronavirus 2 by RT PCR: NEGATIVE

## 2020-08-09 MED ORDER — FLECAINIDE ACETATE 50 MG PO TABS
100.0000 mg | ORAL_TABLET | Freq: Once | ORAL | Status: AC
  Administered 2020-08-09: 100 mg via ORAL
  Filled 2020-08-09: qty 2

## 2020-08-09 MED ORDER — METOPROLOL TARTRATE 5 MG/5ML IV SOLN
5.0000 mg | Freq: Once | INTRAVENOUS | Status: AC
  Administered 2020-08-09: 5 mg via INTRAVENOUS
  Filled 2020-08-09: qty 5

## 2020-08-09 MED ORDER — FLECAINIDE ACETATE 50 MG PO TABS: 150.0000 mg | ORAL_TABLET | Freq: Once | ORAL | Status: DC

## 2020-08-09 NOTE — ED Provider Notes (Addendum)
[facility] EMERGENCY DEPARTMENT Provider Note   CSN: [id number] Arrival date & time: 08/09/20  1053     History Chief Complaint  Patient presents with  . Chest Pain    Jessica Sanchez is a 35 y.o. female.  Patient complains of palpitations.  She has a history of SVT and is on Tambocor.  Patient is supposed to take 75 mg twice a day and an extra pill she has a problem.  She did not take her medicine this morning  The history is provided by the patient and medical records.  Chest Pain Pain location:  L chest Pain quality: aching   Pain radiates to:  Does not radiate Pain severity:  Mild Onset quality:  Sudden Timing:  Constant Progression:  Waxing and waning Chronicity:  New Relieved by:  Nothing Worsened by:  Nothing Associated symptoms: palpitations   Associated symptoms: no abdominal pain, no back pain, no cough, no fatigue and no headache        Past Medical History:  Diagnosis Date  . Fibroid (bleeding) (uterine)   . Fibroid, uterine   . H/O metrorrhagia   . Hypertension   . IUD (intrauterine device) in place 07/11/2015  . Menorrhagia   . Obesity, Class III, BMI 40-49.9 (morbid obesity) ([place]) 11/11/2018  . SVT (supraventricular tachycardia) [facility])     Patient Active Problem List   Diagnosis Date Noted  . Elevated troponin   . Attempted IUD removal, unsuccessful 07/05/2019  . Encounter for IUD insertion 07/05/2019  . Gastroesophageal reflux disease   . Nonspecific chest pain 11/11/2018  . Morbid obesity with body mass index (BMI) of 60.0 to 69.9 in adult ([place]) 11/11/2018  . Elevated d-dimer 11/11/2018  . SVT (supraventricular tachycardia) ([place]) 10/04/2018  . IUD (intrauterine device) in place 07/11/2015  . Other disorder of menstruation and other abnormal bleeding from female genital tract 10/30/2012    Past Surgical History:  Procedure Laterality Date  . HYSTEROSCOPY WITH D & C N/A 09/27/2013   Procedure: DILATATION AND CURETTAGE /HYSTEROSCOPY and  insertion of Mirena IUD ;  Surgeon: [name]. Harrington Challenger, MD;  Location: [facility];  Service: Gynecology;  Laterality: N/A;  . MYOMECTOMY  02/03/2011   Procedure: MYOMECTOMY;  Surgeon: [name], MD;  Location: [facility];  Service: Gynecology;  Laterality: N/A;  . SVT ABLATION N/A 11/08/2018   Procedure: SVT ABLATION;  Surgeon: [name], MD;  Location: [facility];  Service: Cardiovascular;  Laterality: N/A;  . TONSILLECTOMY       OB History    Gravida  1   Para      Term      Preterm      AB  1   Living        SAB  1   IAB      Ectopic      Multiple      Live Births              Family History  Problem Relation Age of Onset  . Cirrhosis Mother   . Diabetes Mother   . Cancer Maternal Grandfather   . Hypertension Sister   . Diabetes Sister   . Anesthesia problems Neg Hx   . Hypotension Neg Hx   . Malignant hyperthermia Neg Hx   . Pseudochol deficiency Neg Hx     Social History   Tobacco Use  . Smoking status: Former Research scientist (life sciences)  . Smokeless tobacco: Never Used  Vaping Use  .  Vaping Use: Never used  Substance Use Topics  . Alcohol use: Yes    Comment: occasional  . Drug use: No    Home Medications Prior to Admission medications   Medication Sig Start Date End Date Taking? Authorizing Provider  acetaminophen (TYLENOL) 325 MG tablet Take 650 mg by mouth every 6 (six) hours as needed for headache.    [provider]  albuterol (PROVENTIL HFA) 108 (90 Base) MCG/ACT inhaler Inhale 1-2 puffs into the lungs every 6 (six) hours as needed for wheezing or shortness of breath.     [provider]  diltiazem (CARDIZEM CD) 240 MG 24 hr capsule Take 1 capsule (240 mg total) by mouth daily. Patient not taking: Reported on 06/26/2020 06/14/19 06/08/20  Erma Heritage, PA-C  flecainide (TAMBOCOR) 150 MG tablet Take 0.5 tablets (75 mg total) by mouth 2 (two) times daily. 10/30/19   [name], MD  Multiple Vitamins-Calcium (ONE-A-DAY WOMENS PO)  Take 1 tablet by mouth daily.    [provider]  pantoprazole (PROTONIX) 20 MG tablet Take 1 tablet (20 mg total) by mouth daily for 7 days. 06/26/20 07/03/20  Caccavale, Sophia, PA-C  apixaban (ELIQUIS) 5 MG TABS tablet Take 2 tablets (10mg ) twice daily for 7 days, then 1 tablet (5mg ) twice daily 04/20/19 04/20/19  Rodell Perna A, PA-C  levonorgestrel (MIRENA) 20 MCG/24HR IUD 1 each by Intrauterine route once.   06/25/20  [provider]    Allergies    Shellfish allergy, Sulfa antibiotics, and Adhesive [tape]  Review of Systems   Review of Systems  Constitutional: Negative for appetite change and fatigue.  HENT: Negative for congestion, ear discharge and sinus pressure.   Eyes: Negative for discharge.  Respiratory: Negative for cough.   Cardiovascular: Positive for chest pain and palpitations.  Gastrointestinal: Negative for abdominal pain and diarrhea.  Genitourinary: Negative for frequency and hematuria.  Musculoskeletal: Negative for back pain.  Skin: Negative for rash.  Neurological: Negative for seizures and headaches.  Psychiatric/Behavioral: Negative for hallucinations.    Physical Exam Updated Vital Signs BP 110/73   Pulse 91   Temp 98.6 F (37 C) (Oral)   Resp (!) 21   Ht 5\' 3"  (1.6 m)   Wt (!) 148.1 kg   SpO2 98%   BMI 57.84 kg/m   Physical Exam Vitals and nursing note reviewed.  Constitutional:      Appearance: She is well-developed.  HENT:     Head: Normocephalic.     Nose: Nose normal.  Eyes:     General: No scleral icterus.    Conjunctiva/sclera: Conjunctivae normal.  Neck:     Thyroid: No thyromegaly.  Cardiovascular:     Rate and Rhythm: Regular rhythm. Tachycardia present.     Heart sounds: No murmur heard. No friction rub. No gallop.   Pulmonary:     Breath sounds: No stridor. No wheezing or rales.  Chest:     Chest wall: No tenderness.  Abdominal:     General: There is no distension.     Tenderness: There is no abdominal  tenderness. There is no rebound.  Musculoskeletal:        General: Normal range of motion.     Cervical back: Neck supple.  Lymphadenopathy:     Cervical: No cervical adenopathy.  Skin:    Findings: No erythema or rash.  Neurological:     Mental Status: She is alert and oriented to person, place, and time.     Motor:  No abnormal muscle tone.     Coordination: Coordination normal.  Psychiatric:        Behavior: Behavior normal.     ED Results / Procedures / Treatments   Labs (all labs ordered are listed, but only abnormal results are displayed) Labs Reviewed  COMPREHENSIVE METABOLIC PANEL - Abnormal; Notable for the following components:      Result Value   AST 12 (*)    All other components within normal limits  RESP PANEL BY RT-PCR (FLU A&B, COVID) ARPGX2  CBC WITH DIFFERENTIAL/PLATELET  TROPONIN I (HIGH SENSITIVITY)  TROPONIN I (HIGH SENSITIVITY)    EKG None  Radiology DG Chest Port 1 View  Result Date: 08/09/2020 CLINICAL DATA:  Chest pain with cough and shortness of breath EXAM: PORTABLE CHEST 1 VIEW COMPARISON:  July 07, 2020 FINDINGS: Lungs are clear. Heart is upper normal in size with pulmonary vascularity normal. No adenopathy. No bone lesions. IMPRESSION: Lungs clear.  Heart upper normal in size. Electronically Signed   By: Lowella Grip III M.D.   On: 08/09/2020 12:18    Procedures Procedures   Medications Ordered in ED Medications  metoprolol tartrate (LOPRESSOR) injection 5 mg (5 mg Intravenous Given 08/09/20 1213)  flecainide (TAMBOCOR) tablet 100 mg (100 mg Oral Given 08/09/20 1158)   CRITICAL CARE Performed by: Milton Ferguson Total critical care time: 40 minutes Critical care time was exclusive of separately billable procedures and treating other patients. Critical care was necessary to treat or prevent imminent or life-threatening deterioration. Critical care was time spent personally by me on the following activities: development of treatment plan  with patient and/or surrogate as well as nursing, discussions with consultants, evaluation of patient's response to treatment, examination of patient, obtaining history from patient or surrogate, ordering and performing treatments and interventions, ordering and review of laboratory studies, ordering and review of radiographic studies, pulse oximetry and re-evaluation of patient's condition.  ED Course  I have reviewed the triage vital signs and the nursing notes.  Pertinent labs & imaging results that were available during my care of the patient were reviewed by me and considered in my medical decision making (see chart for details). Patient with tachycardia possible SVT.  She was given Lopressor 5 mg IV and 100 mg of Tambocor.  Patient's rate slowed down into the 80s in normal sinus rhythm and she felt fine.  Labs unremarkable.  She will be discharged home to follow-up with her cardiologist   MDM Rules/Calculators/A&P      Patient with tachycardia possible SVT that has responded to Lopressor and Tambocor.  Patient to follow-up with her cardiologist                     Final Clinical Impression(s) / ED Diagnoses Final diagnoses:  None    Rx / DC Orders ED Discharge Orders    None       Milton Ferguson, MD 08/09/20 [place]    Milton Ferguson, MD 08/09/20 1506

## 2020-08-09 NOTE — Discharge Instructions (Addendum)
Continue taking your medicine as prescribed and call your cardiologist Monday to get a follow-up appointment in the next week or two.  Return sooner if any problem

## 2020-08-09 NOTE — ED Notes (Signed)
Family with registration, family have called department multiple times, aware that pt cannot have visitors at this time, pt insits taht someone take per sister, Rhianna Raulerson, the cash she has for her. Explained to pt risks associated with handing off cash. Pt and family insists. Pt placed cash in sealed envelope, handed to registration, to be given to sister [employer].

## 2020-08-09 NOTE — ED Triage Notes (Signed)
Pt c/o chest pain with itchy throat, cough and shortness of breath.

## 2020-08-26 ENCOUNTER — Ambulatory Visit (HOSPITAL_COMMUNITY): Payer: 59

## 2020-08-26 ENCOUNTER — Encounter (HOSPITAL_COMMUNITY): Payer: 59

## 2020-08-29 ENCOUNTER — Telehealth: Payer: Self-pay | Admitting: *Deleted

## 2020-08-29 NOTE — Telephone Encounter (Signed)
#  1 I don't have any OR time prior to her surgery schedule date and   #2 Her most recent hemoglobin is fine so there is not  indication as far as that is concerned as far as urgent  Unfortunately I don't have an alternative solution, our surgery schedules are packed

## 2020-08-29 NOTE — Telephone Encounter (Signed)
Patient called stating she had to leave work yesterday due to the side pain along with heavy bleeding w/ clots.  States she really cannot wait any longer and wants her surgery to be done before 09/17/20.  Patient would like a call back today regarding this.

## 2020-08-29 NOTE — Telephone Encounter (Signed)
Spoke to patient and the following information given from DR [facility].  #1 I don't have any OR time prior to her surgery schedule date and   #2 Her most recent hemoglobin is fine so there is not indication as far as that is concerned as far as urgent   Unfortunately I don't have an alternative solution, our surgery schedules are packed .  Also gave Charleston Ropes a message to call patient if surgery cancellation occurs with other patients to possibly move her surgery up.  Pt verbalized understanding.

## 2020-09-04 ENCOUNTER — Other Ambulatory Visit: Payer: Self-pay

## 2020-09-04 ENCOUNTER — Ambulatory Visit (INDEPENDENT_AMBULATORY_CARE_PROVIDER_SITE_OTHER): Payer: 59 | Admitting: Orthopedic Surgery

## 2020-09-04 ENCOUNTER — Encounter: Payer: Self-pay | Admitting: Orthopedic Surgery

## 2020-09-04 VITALS — BP 161/88 | HR 86 | Ht 63.0 in | Wt 320.0 lb

## 2020-09-04 DIAGNOSIS — M62838 Other muscle spasm: Secondary | ICD-10-CM

## 2020-09-04 DIAGNOSIS — N920 Excessive and frequent menstruation with regular cycle: Secondary | ICD-10-CM | POA: Insufficient documentation

## 2020-09-04 DIAGNOSIS — M75101 Unspecified rotator cuff tear or rupture of right shoulder, not specified as traumatic: Secondary | ICD-10-CM

## 2020-09-04 MED ORDER — METHOCARBAMOL 500 MG PO TABS: 500.0000 mg | ORAL_TABLET | Freq: Every evening | ORAL | 1 refills | Status: DC

## 2020-09-04 NOTE — Patient Instructions (Addendum)
Your MRI did not show a rotator cuff tear just some inflammation and mild arthritis  It appears that you have some spasms in the upper part of your shoulder and I am prescribing a muscle relaxer and I want you to apply heat to the area for 30 min at night   I would also like you to do some light shoulder exercises daily to maintain flexibility

## 2020-09-04 NOTE — Progress Notes (Signed)
MRI RESULTS FOLLOW UP   Encounter Diagnoses  Name Primary?  . Rotator cuff syndrome of right shoulder   . Muscle spasms of neck Yes    Chief Complaint  Patient presents with  . Shoulder Pain    Right / review MRI patient states shoulder has gotten worse in past 3 days, she has swelling top of right shoulder.    35 year old female with chronic right shoulder pain thought to have a rotator cuff tear was sent for MRI  Patient complains of pain and swelling over the right trapezius muscle and pain with forward elevation  She was able to switch jobs now works at [employer] improvement does not have to do as much lifting  Still having some problems laying on the right side at night and will often have to sit up in a recliner to get to sleep   + EXAM FINDINGS:   Right shoulder tenderness is in the trapezius muscle she has full passive range of motion with pain at terminal flexion she can actively extend and flex the arm up to 180 degrees  MY READING OF THE MRI  Inflammatory changes around the infraspinatus tendon without rotator cuff tear Mild arthritis in the [facility] joint  MRI REPORT:   IMPRESSION: 1. Study is degraded by motion artifact. 2. No evidence of rotator cuff, labral or biceps tendon tear. Mild infraspinatus tendinosis. 3. Mild acromioclavicular degenerative changes.   Electronically Signed   By: Richardean Sale M.D.   On: 08/05/2020 12:29   ASSESSMENT AND PLAN :   Encounter Diagnoses  Name Primary?  . Rotator cuff syndrome of right shoulder   . Muscle spasms of neck Yes    Seems to be having more muscle spasms around the cervical region recommend heat and Robaxin  She should do some light shoulder exercises to maintain flexibility  Take Robaxin at night and use a heating pad as well Meds ordered this encounter  Medications  . methocarbamol (ROBAXIN) 500 MG tablet    Sig: Take 1 tablet (500 mg total) by mouth at bedtime.    Dispense:  60 tablet     Refill:  1

## 2020-09-11 ENCOUNTER — Telehealth: Payer: Self-pay | Admitting: Obstetrics & Gynecology

## 2020-09-11 NOTE — Telephone Encounter (Signed)
Spoke with patient regarding her not having insurance to cover her surgery on 09/17/2020, patient states that she still wants go ahead with the surgery even though she does not have insurance. Patient is aware that she will have a bill and acknowledged understanding.

## 2020-09-15 ENCOUNTER — Other Ambulatory Visit: Payer: Self-pay | Admitting: Obstetrics & Gynecology

## 2020-09-15 NOTE — Patient Instructions (Signed)
Jessica Sanchez  09/15/2020     @PREFPERIOPPHARMACY @   Your procedure is scheduled on  09/17/2020   Report to [facility] at  [place].   Call this number if you have problems the morning of surgery:  [phone]   Remember:  Do not eat after midnight.  You may drink clear liquids until  0650. At 0650 drink your carb drink and nothing else to drink after this.                       Take these medicines the morning of surgery with A SIP OF WATER  Tambocor.  Use your inhaler before you come and bring your rescue inhaler with you.    Place clean sheets on your bed the night before your procedure and DO  NOT sleep with pets this night.  Shower with CHG the night before and the morning of your procedure and DO NOT use CHG on your face, hair or genitals.  After each shower, dry off with a clean towel, put on clean, comfortable clothes and brush your teeth.      Do not wear jewelry, make-up or nail polish.  Do not wear lotions, powders, or perfumes, or deodorant.  Do not shave 48 hours prior to surgery.  Men may shave face and neck.  Do not bring valuables to the hospital.  [facility] is not responsible for any belongings or valuables.  Contacts, dentures or bridgework may not be worn into surgery.  Leave your suitcase in the car.  After surgery it may be brought to your room.  For patients admitted to the hospital, discharge time will be determined by your treatment team.  Patients discharged the day of surgery will not be allowed to drive home and must have someone with them for 24 hours.   Special instructions:  DO NOT smoke tobacco or vape for 24 hours before your procedure.  Please read over the following fact sheets that you were given. Anesthesia Post-op Instructions and Care and Recovery After Surgery       Dilation and Curettage or Vacuum Curettage, Care After This sheet gives you information about how to care for yourself after your procedure. Your  doctor may also give you more specific instructions. If you have problems or questions, contact your doctor. What can I expect after the procedure? After the procedure, it is common to have:  Mild pain or cramping.  Some bleeding or spotting from the vagina. These may last for up to 2 weeks. Follow these instructions at home: Medicines  Take over-the-counter and prescription medicines only as told by your doctor. This is very important if you take blood-thinning medicine.  Ask your doctor if the medicine prescribed to you requires you to avoid driving or using machinery. Activity  If you were given a medicine to help you relax (sedative) during your procedure, it can affect you for many hours. Do not drive or use machinery until your doctor says that it is safe.  Rest as told by your doctor.  Do not sit for a long time without moving. Get up to take short walks every 1-2 hours. This is important. Ask for help if you feel weak or unsteady.  Do not lift anything that is heavier than 10 lb (4.5 kg), or the limit that you are told, until your doctor says that it is safe.  Return to your normal activities as told  by your doctor. Ask your doctor what activities are safe for you.   Lifestyle For at least 2 weeks, or as long as told by your doctor:  Do not douche.  Do not use tampons.  Do not have sex. General instructions  Wear compression stockings as told by your doctor.  It is up to you to get the results of your procedure. Ask your doctor, or the department that is doing the procedure, when your results will be ready.  Keep all follow-up visits as told by your doctor. This is important. Contact a doctor if:  You have very bad cramps that get worse or do not get better with medicine.  You have very bad pain in your belly (abdomen).  You cannot drink fluids without vomiting.  You have pain in a different part of your pelvis. The pelvis is the area just above your  thighs.  You have fluid from your vagina that smells bad.  You have a rash. Get help right away if:  You are bleeding a lot from your vagina. A lot of bleeding means soaking more than one sanitary pad in 1 hour for 2 hours in a row.  You have a fever that is above 100.1F (38.0C).  Your belly feels very tender or hard.  You have chest pain.  You have trouble breathing.  You feel dizzy.  You feel light-headed.  You pass out (faint).  You have pain in your neck or shoulder area. These symptoms may be an emergency. Do not wait to see if the symptoms will go away. Get medical help right away. Call your local emergency services (911 in the [place] Do not drive yourself to the hospital. Summary  After your procedure, it is common to have pain or cramping. It is also common to have bleeding or spotting from your vagina.  Rest as told. Do not sit for a long time without moving. Get up to take short walks every 1-2 hours.  Do not lift anything that is heavier than 10 lb (4.5 kg), or the limit that you are told.  Contact your doctor if you have fluid from your vagina that smells bad.  Get help right away if you develop any problems from the procedure. Ask your doctor what problems to watch for. This information is not intended to replace advice given to you by your health care provider. Make sure you discuss any questions you have with your health care provider. Document Revised: 05/22/2019 Document Reviewed: 05/22/2019 Elsevier Patient Education  2021 [employer] Anesthesia, Adult, Care After This sheet gives you information about how to care for yourself after your procedure. Your health care provider may also give you more specific instructions. If you have problems or questions, contact your health care provider. What can I expect after the procedure? After the procedure, the following side effects are common:  Pain or discomfort at the IV  site.  Nausea.  Vomiting.  Sore throat.  Trouble concentrating.  Feeling cold or chills.  Feeling weak or tired.  Sleepiness and fatigue.  Soreness and body aches. These side effects can affect parts of the body that were not involved in surgery. Follow these instructions at home: For the time period you were told by your health care provider:  Rest.  Do not participate in activities where you could fall or become injured.  Do not drive or use machinery.  Do not drink alcohol.  Do not take sleeping pills or medicines that cause  drowsiness.  Do not make important decisions or sign legal documents.  Do not take care of children on your own.   Eating and drinking  Follow any instructions from your health care provider about eating or drinking restrictions.  When you feel hungry, start by eating small amounts of foods that are soft and easy to digest (bland), such as toast. Gradually return to your regular diet.  Drink enough fluid to keep your urine pale yellow.  If you vomit, rehydrate by drinking water, juice, or clear broth. General instructions  If you have sleep apnea, surgery and certain medicines can increase your risk for breathing problems. Follow instructions from your health care provider about wearing your sleep device: ? Anytime you are sleeping, including during daytime naps. ? While taking prescription pain medicines, sleeping medicines, or medicines that make you drowsy.  Have a responsible adult stay with you for the time you are told. It is important to have someone help care for you until you are awake and alert.  Return to your normal activities as told by your health care provider. Ask your health care provider what activities are safe for you.  Take over-the-counter and prescription medicines only as told by your health care provider.  If you smoke, do not smoke without supervision.  Keep all follow-up visits as told by your health care  provider. This is important. Contact a health care provider if:  You have nausea or vomiting that does not get better with medicine.  You cannot eat or drink without vomiting.  You have pain that does not get better with medicine.  You are unable to pass urine.  You develop a skin rash.  You have a fever.  You have redness around your IV site that gets worse. Get help right away if:  You have difficulty breathing.  You have chest pain.  You have blood in your urine or stool, or you vomit blood. Summary  After the procedure, it is common to have a sore throat or nausea. It is also common to feel tired.  Have a responsible adult stay with you for the time you are told. It is important to have someone help care for you until you are awake and alert.  When you feel hungry, start by eating small amounts of foods that are soft and easy to digest (bland), such as toast. Gradually return to your regular diet.  Drink enough fluid to keep your urine pale yellow.  Return to your normal activities as told by your health care provider. Ask your health care provider what activities are safe for you. This information is not intended to replace advice given to you by your health care provider. Make sure you discuss any questions you have with your health care provider. Document Revised: 01/03/2020 Document Reviewed: 08/02/2019 Elsevier Patient Education  2021 [employer].

## 2020-09-16 ENCOUNTER — Encounter (HOSPITAL_COMMUNITY): Payer: Self-pay

## 2020-09-16 ENCOUNTER — Other Ambulatory Visit (HOSPITAL_COMMUNITY)
Admission: RE | Admit: 2020-09-16 | Discharge: 2020-09-16 | Disposition: A | Discharge status: Home / Self Care | Payer: Self-pay | Source: Ambulatory Visit | Attending: Obstetrics & Gynecology | Admitting: Obstetrics & Gynecology

## 2020-09-16 ENCOUNTER — Ambulatory Visit (HOSPITAL_COMMUNITY)
Admission: RE | Admit: 2020-09-16 | Discharge: 2020-09-16 | Disposition: A | Discharge status: Home / Self Care | Payer: Self-pay | Source: Ambulatory Visit | Attending: Advanced Practice Midwife | Admitting: Advanced Practice Midwife

## 2020-09-16 ENCOUNTER — Other Ambulatory Visit: Payer: Self-pay

## 2020-09-16 ENCOUNTER — Encounter (HOSPITAL_COMMUNITY)
Admission: RE | Admit: 2020-09-16 | Discharge: 2020-09-16 | Disposition: A | Discharge status: Home / Self Care | Payer: Self-pay | Source: Ambulatory Visit | Attending: Obstetrics & Gynecology | Admitting: Obstetrics & Gynecology

## 2020-09-16 DIAGNOSIS — N6310 Unspecified lump in the right breast, unspecified quadrant: Secondary | ICD-10-CM

## 2020-09-16 DIAGNOSIS — Z01818 Encounter for other preprocedural examination: Secondary | ICD-10-CM | POA: Insufficient documentation

## 2020-09-16 DIAGNOSIS — Z20822 Contact with and (suspected) exposure to covid-19: Secondary | ICD-10-CM | POA: Insufficient documentation

## 2020-09-16 HISTORY — DX: Dyspnea, unspecified: R06.00

## 2020-09-16 HISTORY — DX: Cardiac arrhythmia, unspecified: I49.9

## 2020-09-16 HISTORY — DX: Unspecified osteoarthritis, unspecified site: M19.90

## 2020-09-16 HISTORY — DX: Sleep apnea, unspecified: G47.30

## 2020-09-16 LAB — COMPREHENSIVE METABOLIC PANEL
ALT: 16 U/L (ref 0–44)
AST: 13 U/L — ABNORMAL LOW (ref 15–41)
Albumin: 3.9 g/dL (ref 3.5–5.0)
Alkaline Phosphatase: 66 U/L (ref 38–126)
Anion gap: 7 (ref 5–15)
BUN: 12 mg/dL (ref 6–20)
CO2: 27 mmol/L (ref 22–32)
Calcium: 8.9 mg/dL (ref 8.9–10.3)
Chloride: 101 mmol/L (ref 98–111)
Creatinine, Ser: 0.63 mg/dL (ref 0.44–1.00)
GFR, Estimated: >60 mL/min (ref >60)
Glucose, Bld: 96 mg/dL (ref 70–99)
Potassium: 3.9 mmol/L (ref 3.5–5.1)
Sodium: 135 mmol/L (ref 135–145)
Total Bilirubin: 0.5 mg/dL (ref 0.3–1.2)
Total Protein: 7.7 g/dL (ref 6.5–8.1)

## 2020-09-16 LAB — URINALYSIS, ROUTINE W REFLEX MICROSCOPIC
Bilirubin Urine: NEGATIVE
Glucose, UA: NEGATIVE mg/dL
Ketones, ur: NEGATIVE mg/dL
Nitrite: NEGATIVE
Protein, ur: NEGATIVE mg/dL
Specific Gravity, Urine: 1.023 (ref 1.005–1.030)
pH: 6 (ref 5.0–8.0)

## 2020-09-16 LAB — RAPID HIV SCREEN (HIV 1/2 AB+AG)
HIV 1/2 Antibodies: NONREACTIVE
HIV-1 P24 Antigen - HIV24: NONREACTIVE
Interpretation (HIV Ag Ab): NONREACTIVE

## 2020-09-16 LAB — CBC
HCT: 40.3 % (ref 36.0–46.0)
Hemoglobin: 12.6 g/dL (ref 12.0–15.0)
MCH: 27.5 pg (ref 26.0–34.0)
MCHC: 31.3 g/dL (ref 30.0–36.0)
MCV: 87.8 fL (ref 80.0–100.0)
Platelets: 291 10*3/uL (ref 150–400)
RBC: 4.59 MIL/uL (ref 3.87–5.11)
RDW: 14.6 % (ref 11.5–15.5)
WBC: 5.8 10*3/uL (ref 4.0–10.5)
nRBC: 0 % (ref 0.0–0.2)

## 2020-09-16 LAB — HCG, QUANTITATIVE, PREGNANCY: hCG, Beta Chain, Quant, S: <1 m[IU]/mL (ref <5)

## 2020-09-16 LAB — SARS CORONAVIRUS 2 (TAT 6-24 HRS): SARS Coronavirus 2: NEGATIVE

## 2020-09-17 ENCOUNTER — Ambulatory Visit (HOSPITAL_COMMUNITY)
Admission: RE | Admit: 2020-09-17 | Discharge: 2020-09-17 | Disposition: A | Discharge status: Home / Self Care | Payer: Self-pay | Source: Ambulatory Visit | Attending: Obstetrics & Gynecology | Admitting: Obstetrics & Gynecology

## 2020-09-17 ENCOUNTER — Ambulatory Visit (HOSPITAL_COMMUNITY): Payer: Self-pay | Admitting: Anesthesiology

## 2020-09-17 ENCOUNTER — Encounter (HOSPITAL_COMMUNITY): Payer: Self-pay | Admitting: Obstetrics & Gynecology

## 2020-09-17 ENCOUNTER — Encounter (HOSPITAL_COMMUNITY)
Admission: RE | Disposition: A | Discharge status: Home / Self Care | Payer: Self-pay | Source: Ambulatory Visit | Attending: Obstetrics & Gynecology

## 2020-09-17 DIAGNOSIS — T8332XA Displacement of intrauterine contraceptive device, initial encounter: Secondary | ICD-10-CM | POA: Insufficient documentation

## 2020-09-17 DIAGNOSIS — Z8379 Family history of other diseases of the digestive system: Secondary | ICD-10-CM | POA: Insufficient documentation

## 2020-09-17 DIAGNOSIS — X58XXXA Exposure to other specified factors, initial encounter: Secondary | ICD-10-CM | POA: Insufficient documentation

## 2020-09-17 DIAGNOSIS — Z882 Allergy status to sulfonamides status: Secondary | ICD-10-CM | POA: Insufficient documentation

## 2020-09-17 DIAGNOSIS — Z91013 Allergy to seafood: Secondary | ICD-10-CM | POA: Insufficient documentation

## 2020-09-17 DIAGNOSIS — Z789 Other specified health status: Secondary | ICD-10-CM

## 2020-09-17 DIAGNOSIS — Z833 Family history of diabetes mellitus: Secondary | ICD-10-CM | POA: Insufficient documentation

## 2020-09-17 DIAGNOSIS — F17200 Nicotine dependence, unspecified, uncomplicated: Secondary | ICD-10-CM | POA: Insufficient documentation

## 2020-09-17 DIAGNOSIS — Z30432 Encounter for removal of intrauterine contraceptive device: Secondary | ICD-10-CM

## 2020-09-17 DIAGNOSIS — K219 Gastro-esophageal reflux disease without esophagitis: Secondary | ICD-10-CM | POA: Insufficient documentation

## 2020-09-17 DIAGNOSIS — Z8249 Family history of ischemic heart disease and other diseases of the circulatory system: Secondary | ICD-10-CM | POA: Insufficient documentation

## 2020-09-17 DIAGNOSIS — N920 Excessive and frequent menstruation with regular cycle: Secondary | ICD-10-CM | POA: Insufficient documentation

## 2020-09-17 DIAGNOSIS — Z888 Allergy status to other drugs, medicaments and biological substances status: Secondary | ICD-10-CM | POA: Insufficient documentation

## 2020-09-17 HISTORY — PX: IUD REMOVAL: SHX5392

## 2020-09-17 HISTORY — PX: HYSTEROSCOPY: SHX211

## 2020-09-17 SURGERY — HYSTEROSCOPY
Anesthesia: General

## 2020-09-17 MED ORDER — ROCURONIUM 10MG/ML (10ML) SYRINGE FOR MEDFUSION PUMP - OPTIME
INTRAVENOUS | Status: DC | PRN
  Administered 2020-09-17: 10 mg via INTRAVENOUS

## 2020-09-17 MED ORDER — MIDAZOLAM HCL 2 MG/2ML IJ SOLN
INTRAMUSCULAR | Status: AC
  Filled 2020-09-17: qty 2

## 2020-09-17 MED ORDER — PHENYLEPHRINE HCL (PRESSORS) 10 MG/ML IV SOLN
INTRAVENOUS | Status: DC | PRN
  Administered 2020-09-17: 60 ug via INTRAVENOUS

## 2020-09-17 MED ORDER — MIDAZOLAM HCL 5 MG/5ML IJ SOLN
INTRAMUSCULAR | Status: DC | PRN
  Administered 2020-09-17: 2 mg via INTRAVENOUS

## 2020-09-17 MED ORDER — SUCCINYLCHOLINE 20MG/ML (10ML) SYRINGE FOR MEDFUSION PUMP - OPTIME
INTRAMUSCULAR | Status: DC | PRN
  Administered 2020-09-17: 160 mg via INTRAVENOUS

## 2020-09-17 MED ORDER — FENTANYL CITRATE (PF) 100 MCG/2ML IJ SOLN
25.0000 ug | INTRAMUSCULAR | Status: DC | PRN
  Administered 2020-09-17: 50 ug via INTRAVENOUS
  Filled 2020-09-17: qty 2

## 2020-09-17 MED ORDER — ONDANSETRON 8 MG PO TBDP: 8.0000 mg | ORAL_TABLET | Freq: Three times a day (TID) | ORAL | 0 refills | Status: DC | PRN

## 2020-09-17 MED ORDER — ORAL CARE MOUTH RINSE: 15.0000 mL | Freq: Once | OROMUCOSAL | Status: DC

## 2020-09-17 MED ORDER — PROPOFOL 10 MG/ML IV BOLUS
INTRAVENOUS | Status: DC | PRN
  Administered 2020-09-17: 200 mg via INTRAVENOUS

## 2020-09-17 MED ORDER — PROPOFOL 10 MG/ML IV BOLUS
INTRAVENOUS | Status: AC
  Filled 2020-09-17: qty 40

## 2020-09-17 MED ORDER — SUGAMMADEX SODIUM 200 MG/2ML IV SOLN
INTRAVENOUS | Status: DC | PRN
  Administered 2020-09-17: 100 mg via INTRAVENOUS

## 2020-09-17 MED ORDER — CHLORHEXIDINE GLUCONATE 0.12 % MT SOLN
OROMUCOSAL | Status: AC
  Administered 2020-09-17: 15 mL
  Filled 2020-09-17: qty 15

## 2020-09-17 MED ORDER — ONDANSETRON HCL 4 MG/2ML IJ SOLN: 4.0000 mg | Freq: Once | INTRAMUSCULAR | Status: DC | PRN

## 2020-09-17 MED ORDER — LACTATED RINGERS IV SOLN: INTRAVENOUS | Status: DC | PRN

## 2020-09-17 MED ORDER — CHLORHEXIDINE GLUCONATE 0.12 % MT SOLN: 15.0000 mL | Freq: Once | OROMUCOSAL | Status: DC

## 2020-09-17 MED ORDER — ONDANSETRON HCL 4 MG/2ML IJ SOLN
INTRAMUSCULAR | Status: DC | PRN
  Administered 2020-09-17: 4 mg via INTRAVENOUS

## 2020-09-17 MED ORDER — FENTANYL CITRATE (PF) 100 MCG/2ML IJ SOLN
INTRAMUSCULAR | Status: DC | PRN
  Administered 2020-09-17: 50 ug via INTRAVENOUS

## 2020-09-17 MED ORDER — FENTANYL CITRATE (PF) 250 MCG/5ML IJ SOLN
INTRAMUSCULAR | Status: AC
  Filled 2020-09-17: qty 5

## 2020-09-17 MED ORDER — KETOROLAC TROMETHAMINE 10 MG PO TABS: 10.0000 mg | ORAL_TABLET | Freq: Three times a day (TID) | ORAL | 0 refills | Status: DC | PRN

## 2020-09-17 MED ORDER — KETOROLAC TROMETHAMINE 30 MG/ML IJ SOLN
30.0000 mg | Freq: Once | INTRAMUSCULAR | Status: AC
  Administered 2020-09-17: 30 mg via INTRAVENOUS
  Filled 2020-09-17: qty 1

## 2020-09-17 MED ORDER — LACTATED RINGERS IV SOLN: INTRAVENOUS | Status: DC

## 2020-09-17 MED ORDER — LIDOCAINE HCL (CARDIAC) PF 50 MG/5ML IV SOSY
PREFILLED_SYRINGE | INTRAVENOUS | Status: DC | PRN
  Administered 2020-09-17: 100 mg via INTRAVENOUS

## 2020-09-17 MED ORDER — POVIDONE-IODINE 10 % EX SWAB: 2.0000 "application " | Freq: Once | CUTANEOUS | Status: DC

## 2020-09-17 MED ORDER — SODIUM CHLORIDE 0.9 % IR SOLN
Status: DC | PRN
  Administered 2020-09-17: 500 mL
  Administered 2020-09-17: 3000 mL

## 2020-09-17 MED ORDER — CEFAZOLIN SODIUM-DEXTROSE 2-4 GM/100ML-% IV SOLN
2.0000 g | INTRAVENOUS | Status: AC
  Administered 2020-09-17: 2 g via INTRAVENOUS
  Filled 2020-09-17: qty 100

## 2020-09-17 SURGICAL SUPPLY — 26 items
BAG HAMPER (MISCELLANEOUS) ×2 IMPLANT
CLOTH BEACON ORANGE TIMEOUT ST (SAFETY) ×2 IMPLANT
COVER LIGHT HANDLE STERIS (MISCELLANEOUS) ×4 IMPLANT
COVER WAND RF STERILE (DRAPES) ×2 IMPLANT
GAUZE 4X4 16PLY RFD (DISPOSABLE) ×2 IMPLANT
GLOVE ECLIPSE 8.0 STRL XLNG CF (GLOVE) ×2 IMPLANT
GLOVE SRG 8 PF TXTR STRL LF DI (GLOVE) ×1 IMPLANT
GLOVE SURG UNDER POLY LF SZ7 (GLOVE) ×4 IMPLANT
GLOVE SURG UNDER POLY LF SZ8 (GLOVE) ×2
GOWN STRL REUS W/TWL LRG LVL3 (GOWN DISPOSABLE) ×2 IMPLANT
GOWN STRL REUS W/TWL XL LVL3 (GOWN DISPOSABLE) ×2 IMPLANT
INST SET HYSTEROSCOPY (KITS) ×2 IMPLANT
IV NS IRRIG 3000ML ARTHROMATIC (IV SOLUTION) ×2 IMPLANT
KIT TURNOVER CYSTO (KITS) ×2 IMPLANT
KIT TURNOVER KIT A (KITS) ×2 IMPLANT
MANIFOLD NEPTUNE II (INSTRUMENTS) ×2 IMPLANT
NS IRRIG 500ML POUR BTL (IV SOLUTION) ×2 IMPLANT
PACK BASIC III (CUSTOM PROCEDURE TRAY) ×2
PACK SRG BSC III STRL LF ECLPS (CUSTOM PROCEDURE TRAY) ×1 IMPLANT
PAD ARMBOARD 7.5X6 YLW CONV (MISCELLANEOUS) ×2 IMPLANT
PAD TELFA 3X4 1S STER (GAUZE/BANDAGES/DRESSINGS) ×2 IMPLANT
SET BASIN LINEN APH (SET/KITS/TRAYS/PACK) ×2 IMPLANT
SET CYSTO W/LG BORE CLAMP LF (SET/KITS/TRAYS/PACK) ×2 IMPLANT
SHEET LAVH (DRAPES) ×2 IMPLANT
TOWEL OR 17X26 4PK STRL BLUE (TOWEL DISPOSABLE) ×2 IMPLANT
TUBE CONNECTING 12X1/4 (SUCTIONS) ×2 IMPLANT

## 2020-09-17 NOTE — Op Note (Signed)
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 blocking them anteriorly No strings are visible An attempt was made in the office removal without success She is brought in today for hysteroscopic removal  DeScription of operation: Patient was taken to the operating room placed in the supine position where she underwent general endotracheal anesthesia She was placed in low lithotomy position and prepped and draped in the usual sterile fashion for hysteroscopic procedure She received Ancef and Toradol preoperatively Graves speculum was placed in the anterior cervix was grasped with a single-tooth tenaculum The cervix was dilated serially to allow passage of the hysteroscope up to 31 French Hysteroscope confirmed the IUD's and a [place] clamp was used and both removed without difficulty Hysteroscopy revealed a normal endometrial cavity with no distortion by submucosal myoma Endometrium appeared to be normal This point the procedure was completed She was awakened from anesthesia taken recovery in good stable condition Bleeding was minimal She will have a postop visit via MyChart We placed on megestrol post operatively to synchronize her endometrium and have a synchronized withdrawal.  [name], MD 09/17/2020

## 2020-09-17 NOTE — Discharge Instructions (Signed)
Hysteroscopy Hysteroscopy is a procedure used to look inside a woman's womb (uterus). This may be done for various reasons, including:  To look for tumors and other growths in the uterus.  To evaluate abnormal bleeding, fibroid tumors, polyps, scar tissue, or uterine cancer.  To determine why a woman is unable to get pregnant or has had repeated pregnancy losses.  To locate an IUD (intrauterine device).  To place a birth control device into the fallopian tubes. During this procedure, a thin, flexible tube with a small light and camera (hysteroscope) is used to examine the uterus. The camera sends images to a monitor in the room so that your health care provider can view the inside of your uterus. A hysteroscopy should be done right after a menstrual period. Tell a health care provider about:  Any allergies you have.  All medicines you are taking, including vitamins, herbs, eye drops, creams, and over-the-counter medicines.  Any problems you or family members have had with anesthetic medicines.  Any blood disorders you have.  Any surgeries you have had.  Any medical conditions you have.  Whether you are pregnant or may be pregnant.  Whether you have been diagnosed with an STI (sexually transmitted infection) or you think you have an STI. What are the risks? Generally, this is a safe procedure. However, problems may occur, including:  Excessive bleeding.  Infection.  Damage to the uterus or other structures or organs.  Allergic reaction to medicines or fluids that are used in the procedure. What happens before the procedure? Staying hydrated Follow instructions from your health care provider about hydration, which may include:  Up to 2 hours before the procedure - you may continue to drink clear liquids, such as water, clear fruit juice, black coffee, and plain tea. Eating and drinking restrictions Follow instructions from your health care provider about eating and  drinking, which may include:  8 hours before the procedure - stop eating solid foods and drink clear liquids only.  2 hours before the procedure - stop drinking clear liquids. Medicines  Ask your health care provider about: ? Changing or stopping your regular medicines. This is especially important if you are taking diabetes medicines or blood thinners. ? Taking medicines such as aspirin and ibuprofen. These medicines can thin your blood. Do not take these medicines unless your health care provider tells you to take them. ? Taking over-the-counter medicines, vitamins, herbs, and supplements.  Medicine may be placed in your cervix the day before the procedure. This medicine causes the cervix to open (dilate). The larger opening makes it easier for the hysteroscope to be inserted into the uterus during the procedure. General instructions  Ask your health care provider: ? What steps will be taken to help prevent infection. These steps may include:  Washing skin with a germ-killing soap.  Taking antibiotic medicine.  Do not use any products that contain nicotine or tobacco for at least 4 weeks before the procedure. These products include cigarettes, chewing tobacco, and vaping devices, such as e-cigarettes. If you need help quitting, ask your health care provider.  Plan to have a responsible adult take you home from the hospital or clinic.  Plan to have a responsible adult care for you for the time you are told after you leave the hospital or clinic. This is important.  Empty your bladder before the procedure begins. What happens during the procedure?  An IV will be inserted into one of your veins.  You may be given: ?  A medicine to help you relax (sedative). ? A medicine that numbs the area around the cervix (local anesthetic). ? A medicine to make you fall asleep (general anesthetic).  A hysteroscope will be inserted through your vagina and into your uterus.  Air or fluid will  be used to enlarge your uterus to allow your health care provider to see it better. The amount of fluid used will be carefully checked throughout the procedure.  In some cases, tissue may be gently scraped from inside the uterus and sent to a lab for testing (biopsy). The procedure may vary among health care providers and hospitals. What can I expect after the procedure?  Your blood pressure, heart rate, breathing rate, and blood oxygen level will be monitored until you leave the hospital or clinic.  You may have cramps. You may be given medicines for this.  You may have bleeding, which may vary from light spotting to menstrual-like bleeding. This is normal.  If you had a biopsy, it is up to you to get the results. Ask your health care provider, or the department that is doing the procedure, when your results will be ready. Follow these instructions at home: Activity  Rest as told by your health care provider.  Return to your normal activities as told by your health care provider. Ask your health care provider what activities are safe for you.  If you were given a sedative during the procedure, it can affect you for several hours. Do not drive or operate machinery until your health care provider says that it is safe. Medicines  Do not take aspirin or other NSAIDs during recovery, as told by your healthcare provider. It can increase the risk of bleeding.  Ask your health care provider if the medicine prescribed to you: ? Requires you to avoid driving or using machinery. ? Can cause constipation. You may need to take these actions to prevent or treat constipation:  Drink enough fluid to keep your urine pale yellow.  Take over-the-counter or prescription medicines.  Eat foods that are high in fiber, such as beans, whole grains, and fresh fruits and vegetables.  Limit foods that are high in fat and processed sugars, such as fried or sweet foods. General instructions  Do not douche,  use tampons, or have sex for 2 weeks after the procedure, or until your health care provider approves.  Do not take baths, swim, or use a hot tub until your health care provider approves. Take showers instead of baths for 2 weeks, or for as long as told by your health care provider.  Keep all follow-up visits. This is important. Contact a health care provider if:  You feel dizzy or lightheaded.  You feel nauseous.  You have abnormal vaginal discharge.  You have a rash.  You have pain that does not get better with medicine.  You have chills. Get help right away if:  You have bleeding that is heavier than a normal menstrual period.  You have a fever.  You have pain or cramps that get worse.  You develop new abdominal pain.  You faint.  You have pain in your shoulder.  You are short of breath. Summary  Hysteroscopy is a procedure that is used to look inside a woman's womb (uterus).  After the procedure, you may have bleeding, which varies from light spotting to menstrual-like bleeding. This is normal. You may also have cramps.  Do not douche, use tampons, or have sex for 2 weeks after  the procedure, or until your health care provider approves.  Plan to have a responsible adult take you home from the hospital or clinic. This information is not intended to replace advice given to you by your health care provider. Make sure you discuss any questions you have with your health care provider. Document Revised: 12/05/2019 Document Reviewed: 12/05/2019 Elsevier Patient Education  2021 [employer]      General Anesthesia, Adult, Care After This sheet gives you information about how to care for yourself after your procedure. Your health care provider may also give you more specific instructions. If you have problems or questions, contact your health care provider. What can I expect after the procedure? After the procedure, the following side effects are common:  Pain or  discomfort at the IV site.  Nausea.  Vomiting.  Sore throat.  Trouble concentrating.  Feeling cold or chills.  Feeling weak or tired.  Sleepiness and fatigue.  Soreness and body aches. These side effects can affect parts of the body that were not involved in surgery. Follow these instructions at home: For the time period you were told by your health care provider:  Rest.  Do not participate in activities where you could fall or become injured.  Do not drive or use machinery.  Do not drink alcohol.  Do not take sleeping pills or medicines that cause drowsiness.  Do not make important decisions or sign legal documents.  Do not take care of children on your own.   Eating and drinking  Follow any instructions from your health care provider about eating or drinking restrictions.  When you feel hungry, start by eating small amounts of foods that are soft and easy to digest (bland), such as toast. Gradually return to your regular diet.  Drink enough fluid to keep your urine pale yellow.  If you vomit, rehydrate by drinking water, juice, or clear broth. General instructions  If you have sleep apnea, surgery and certain medicines can increase your risk for breathing problems. Follow instructions from your health care provider about wearing your sleep device: ? Anytime you are sleeping, including during daytime naps. ? While taking prescription pain medicines, sleeping medicines, or medicines that make you drowsy.  Have a responsible adult stay with you for the time you are told. It is important to have someone help care for you until you are awake and alert.  Return to your normal activities as told by your health care provider. Ask your health care provider what activities are safe for you.  Take over-the-counter and prescription medicines only as told by your health care provider.  If you smoke, do not smoke without supervision.  Keep all follow-up visits as told by  your health care provider. This is important. Contact a health care provider if:  You have nausea or vomiting that does not get better with medicine.  You cannot eat or drink without vomiting.  You have pain that does not get better with medicine.  You are unable to pass urine.  You develop a skin rash.  You have a fever.  You have redness around your IV site that gets worse. Get help right away if:  You have difficulty breathing.  You have chest pain.  You have blood in your urine or stool, or you vomit blood. Summary  After the procedure, it is common to have a sore throat or nausea. It is also common to feel tired.  Have a responsible adult stay with you for the   time you are told. It is important to have someone help care for you until you are awake and alert.  When you feel hungry, start by eating small amounts of foods that are soft and easy to digest (bland), such as toast. Gradually return to your regular diet.  Drink enough fluid to keep your urine pale yellow.  Return to your normal activities as told by your health care provider. Ask your health care provider what activities are safe for you. This information is not intended to replace advice given to you by your health care provider. Make sure you discuss any questions you have with your health care provider. Document Revised: 01/03/2020 Document Reviewed: 08/02/2019 Elsevier Patient Education  2021 [employer]  

## 2020-09-17 NOTE — Transfer of Care (Signed)
Immediate Anesthesia Transfer of Care Note  Patient: Jessica Sanchez  Procedure(s) Performed: HYSTEROSCOPY (N/A ) INTRAUTERINE DEVICE (IUD) REMOVAL (2) (N/A )  Patient Location: PACU  Anesthesia Type:General  Level of Consciousness: awake  Airway & Oxygen Therapy: Patient Spontanous Breathing  Post-op Assessment: Report given to RN  Post vital signs: Reviewed and stable  Last Vitals:  Vitals Value Taken Time  BP 120/77 09/17/20 1037  Temp    Pulse 91 09/17/20 1039  Resp 25 09/17/20 1039  SpO2 89 % 09/17/20 1039  Vitals shown include unvalidated device data.  Last Pain:  Vitals:   09/17/20 0854  TempSrc: Oral  PainSc: 0-No pain      Patients Stated Pain Goal: 8 (72/62/03 [id number])  Complications: No complications documented.

## 2020-09-17 NOTE — H&P (Signed)
Preoperative History and Physical  Jessica Sanchez is a 35 y.o. G1P0010 with No LMP recorded. (Menstrual status: IUD). admitted for a hysteroscopic removal of 2 IUDs.  No strings visible, can't be seen on sonogram due to anterior fibroid. Patient wants them both removed due to headaches bleeding might want to get pregnant S/P myomectomy 2012  PMH:    Past Medical History:  Diagnosis Date  . Arthritis    right shoulder  . Dyspnea   . Dysrhythmia    hx of SVT  . Fibroid (bleeding) (uterine)   . Fibroid, uterine   . H/O metrorrhagia   . Hypertension   . IUD (intrauterine device) in place 07/11/2015  . Menorrhagia   . Obesity, Class III, BMI 40-49.9 (morbid obesity) ([place]) 11/11/2018  . Sleep apnea    uses CPAP - setting 11  . SVT (supraventricular tachycardia) (H[facility])     PSH:     Past Surgical History:  Procedure Laterality Date  . HYSTEROSCOPY WITH D & C N/A 09/27/2013   Procedure: DILATATION AND CURETTAGE /HYSTEROSCOPY and insertion of Mirena IUD ;  Surgeon: [name]. Harrington Challenger, MD;  Location: [facility];  Service: Gynecology;  Laterality: N/A;  . MYOMECTOMY  02/03/2011   Procedure: MYOMECTOMY;  Surgeon: [name], MD;  Location: [facility];  Service: Gynecology;  Laterality: N/A;  . SVT ABLATION N/A 11/08/2018   Procedure: SVT ABLATION;  Surgeon: [name], MD;  Location: [facility];  Service: Cardiovascular;  Laterality: N/A;  . TONSILLECTOMY      POb/GynH:      OB History    Gravida  1   Para      Term      Preterm      AB  1   Living        SAB  1   IAB      Ectopic      Multiple      Live Births              SH:   Social History   Tobacco Use  . Smoking status: Light Tobacco Smoker  . Smokeless tobacco: Never Used  . Tobacco comment: socially  Vaping Use  . Vaping Use: Never used  Substance Use Topics  . Alcohol use: Yes    Comment: occasional  . Drug use: No    FH:    Family History  Problem Relation Age of Onset  . Cirrhosis  Mother   . Diabetes Mother   . Cancer Maternal Grandfather   . Hypertension Sister   . Diabetes Sister   . Anesthesia problems Neg Hx   . Hypotension Neg Hx   . Malignant hyperthermia Neg Hx   . Pseudochol deficiency Neg Hx      Allergies:  Allergies  Allergen Reactions  . Shellfish Allergy Shortness Of Breath, Itching and Swelling    Mouth became swollen, throat started itching, and developed shortness of breath  . Sulfa Antibiotics Itching, Shortness Of Breath and Swelling    Mouth became swollen, throat started itching, and developed shortness of breath  . Adhesive [Tape] Itching    Depends on the type of medical tape    Medications:       Current Facility-Administered Medications:  .  ceFAZolin (ANCEF) IVPB 2g/100 mL premix, 2 g, Intravenous, On Call to OR, [name], MD .  povidone-iodine 10 % swab 2 application, 2 application, Topical, Once, Telisha Zawadzki, Mertie Clause, MD  Review  of Systems:   Review of Systems  Constitutional: Negative for fever, chills, weight loss, malaise/fatigue and diaphoresis.  HENT: Negative for hearing loss, ear pain, nosebleeds, congestion, sore throat, neck pain, tinnitus and ear discharge.   Eyes: Negative for blurred vision, double vision, photophobia, pain, discharge and redness.  Respiratory: Negative for cough, hemoptysis, sputum production, shortness of breath, wheezing and stridor.   Cardiovascular: Negative for chest pain, palpitations, orthopnea, claudication, leg swelling and PND.  Gastrointestinal: Positive for abdominal pain. Negative for heartburn, nausea, vomiting, diarrhea, constipation, blood in stool and melena.  Genitourinary: Negative for dysuria, urgency, frequency, hematuria and flank pain.  Musculoskeletal: Negative for myalgias, back pain, joint pain and falls.  Skin: Negative for itching and rash.  Neurological: Negative for dizziness, tingling, tremors, sensory change, speech change, focal weakness, seizures, loss of  consciousness, weakness and headaches.  Endo/Heme/Allergies: Negative for environmental allergies and polydipsia. Does not bruise/bleed easily.  Psychiatric/Behavioral: Negative for depression, suicidal ideas, hallucinations, memory loss and substance abuse. The patient is not nervous/anxious and does not have insomnia.      PHYSICAL EXAM:  Blood pressure 125/74, temperature 98.4 F (36.9 C), temperature source Oral, resp. rate 18, SpO2 97 %.    Vitals reviewed. Constitutional: She is oriented to person, place, and time. She appears well-developed and well-nourished.  HENT:  Head: Normocephalic and atraumatic.  Right Ear: External ear normal.  Left Ear: External ear normal.  Nose: Nose normal.  Mouth/Throat: Oropharynx is clear and moist.  Eyes: Conjunctivae and EOM are normal. Pupils are equal, round, and reactive to light. Right eye exhibits no discharge. Left eye exhibits no discharge. No scleral icterus.  Neck: Normal range of motion. Neck supple. No tracheal deviation present. No thyromegaly present.  Cardiovascular: Normal rate, regular rhythm, normal heart sounds and intact distal pulses.  Exam reveals no gallop and no friction rub.   No murmur heard. Respiratory: Effort normal and breath sounds normal. No respiratory distress. She has no wheezes. She has no rales. She exhibits no tenderness.  GI: Soft. Bowel sounds are normal. She exhibits no distension and no mass. There is tenderness. There is no rebound and no guarding.  Genitourinary:       Vulva is normal without lesions Vagina is pink moist without discharge Cervix normal in appearance and pap is normal Uterus is enlarged 16 weeks size Adnexa is negative with normal sized ovaries by sonogram  Musculoskeletal: Normal range of motion. She exhibits no edema and no tenderness.  Neurological: She is alert and oriented to person, place, and time. She has normal reflexes. She displays normal reflexes. No cranial nerve deficit.  She exhibits normal muscle tone. Coordination normal.  Skin: Skin is warm and dry. No rash noted. No erythema. No pallor.  Psychiatric: She has a normal mood and affect. Her behavior is normal. Judgment and thought content normal.    Labs: Results for orders placed or performed during the hospital encounter of 09/16/20 (from the past 336 hour(s))  SARS CORONAVIRUS 2 (TAT 6-24 HRS) Nasopharyngeal Nasopharyngeal Swab   Collection Time: 09/16/20  8:54 AM   Specimen: Nasopharyngeal Swab  Result Value Ref Range   SARS Coronavirus 2 NEGATIVE NEGATIVE  CBC   Collection Time: 09/16/20  9:10 AM  Result Value Ref Range   WBC 5.8 4.0 - 10.5 K/uL   RBC 4.59 3.87 - 5.11 MIL/uL   Hemoglobin 12.6 12.0 - 15.0 g/dL   HCT 40.3 36.0 - 46.0 %   MCV 87.8 80.0 - 100.0  fL   MCH 27.5 26.0 - 34.0 pg   MCHC 31.3 30.0 - 36.0 g/dL   RDW 14.6 11.5 - 15.5 %   Platelets 291 150 - 400 K/uL   nRBC 0.0 0.0 - 0.2 %  Comprehensive metabolic panel   Collection Time: 09/16/20  9:10 AM  Result Value Ref Range   Sodium 135 135 - 145 mmol/L   Potassium 3.9 3.5 - 5.1 mmol/L   Chloride 101 98 - 111 mmol/L   CO2 27 22 - 32 mmol/L   Glucose, Bld 96 70 - 99 mg/dL   BUN 12 6 - 20 mg/dL   Creatinine, Ser 0.63 0.44 - 1.00 mg/dL   Calcium 8.9 8.9 - 10.3 mg/dL   Total Protein 7.7 6.5 - 8.1 g/dL   Albumin 3.9 3.5 - 5.0 g/dL   AST 13 (L) 15 - 41 U/L   ALT 16 0 - 44 U/L   Alkaline Phosphatase 66 38 - 126 U/L   Total Bilirubin 0.5 0.3 - 1.2 mg/dL   GFR, Estimated >60 >60 mL/min   Anion gap 7 5 - 15  hCG, quantitative, pregnancy   Collection Time: 09/16/20  9:10 AM  Result Value Ref Range   hCG, Beta Chain, Quant, S <1 <5 mIU/mL  Rapid HIV screen (HIV 1/2 Ab+Ag)   Collection Time: 09/16/20  9:10 AM  Result Value Ref Range   HIV-1 P24 Antigen - HIV24 NON REACTIVE NON REACTIVE   HIV 1/2 Antibodies NON REACTIVE NON REACTIVE   Interpretation (HIV Ag Ab) NON REACTIVE   Urinalysis, Routine w reflex microscopic Urine, Clean  Catch   Collection Time: 09/16/20  9:51 AM  Result Value Ref Range   Color, Urine YELLOW YELLOW   APPearance HAZY (A) CLEAR   Specific Gravity, Urine 1.023 1.005 - 1.030   pH 6.0 5.0 - 8.0   Glucose, UA NEGATIVE NEGATIVE mg/dL   Hgb urine dipstick MODERATE (A) NEGATIVE   Bilirubin Urine NEGATIVE NEGATIVE   Ketones, ur NEGATIVE NEGATIVE mg/dL   Protein, ur NEGATIVE NEGATIVE mg/dL   Nitrite NEGATIVE NEGATIVE   Leukocytes,Ua SMALL (A) NEGATIVE   RBC / HPF 11-20 0 - 5 RBC/hpf   WBC, UA 21-50 0 - 5 WBC/hpf   Bacteria, UA RARE (A) NONE SEEN   Squamous Epithelial / LPF 6-10 0 - 5   Mucus PRESENT     EKG: Orders placed or performed during the hospital encounter of 09/16/20  . EKG 12-Lead  . EKG 12-Lead    Imaging Studies: US BREAST LTD UNI RIGHT INC AXILLA  Result Date: 09/16/2020 CLINICAL DATA:  35 year old female with a palpable area of concern in the right breast. EXAM: DIGITAL DIAGNOSTIC BILATERAL MAMMOGRAM WITH TOMOSYNTHESIS AND CAD; ULTRASOUND RIGHT BREAST LIMITED TECHNIQUE: Bilateral digital diagnostic mammography and breast tomosynthesis was performed. The images were evaluated with computer-aided detection.; Targeted ultrasound examination of the right breast was performed COMPARISON:  None. ACR Breast Density Category b: There are scattered areas of fibroglandular density. FINDINGS: No suspicious masses or calcifications are seen in either breast. Spot compression MLO tomograms were performed over the palpable area of concern in the slightly upper right breast with no definite abnormality seen. Physical examination at site of palpable concern in the slightly upper right breast reveals a bandlike area of thickening however no discrete palpable masses. Targeted ultrasound of the right breast was performed. No suspicious masses or abnormality seen, only heterogeneous fibroglandular tissue identified. IMPRESSION: 1. No mammographic or sonographic abnormalities at the site  of palpable  concern in the slightly upper right breast. 2.  No mammographic evidence of malignancy in either breast. RECOMMENDATION: 1. Recommend further management of the right breast palpable area of concern be based on clinical assessment. 2. Screening mammogram at age 32 unless there are persistent or intervening clinical concerns. (Code:SM-B-40A) I have discussed the findings and recommendations with the patient. If applicable, a reminder letter will be sent to the patient regarding the next appointment. BI-RADS CATEGORY  1: Negative. Electronically Signed   By: Everlean Alstrom M.D.   On: 09/16/2020 15:49   MM DIAG BREAST TOMO BILATERAL  Result Date: 09/16/2020 CLINICAL DATA:  35 year old female with a palpable area of concern in the right breast. EXAM: DIGITAL DIAGNOSTIC BILATERAL MAMMOGRAM WITH TOMOSYNTHESIS AND CAD; ULTRASOUND RIGHT BREAST LIMITED TECHNIQUE: Bilateral digital diagnostic mammography and breast tomosynthesis was performed. The images were evaluated with computer-aided detection.; Targeted ultrasound examination of the right breast was performed COMPARISON:  None. ACR Breast Density Category b: There are scattered areas of fibroglandular density. FINDINGS: No suspicious masses or calcifications are seen in either breast. Spot compression MLO tomograms were performed over the palpable area of concern in the slightly upper right breast with no definite abnormality seen. Physical examination at site of palpable concern in the slightly upper right breast reveals a bandlike area of thickening however no discrete palpable masses. Targeted ultrasound of the right breast was performed. No suspicious masses or abnormality seen, only heterogeneous fibroglandular tissue identified. IMPRESSION: 1. No mammographic or sonographic abnormalities at the site of palpable concern in the slightly upper right breast. 2.  No mammographic evidence of malignancy in either breast. RECOMMENDATION: 1. Recommend further  management of the right breast palpable area of concern be based on clinical assessment. 2. Screening mammogram at age 72 unless there are persistent or intervening clinical concerns. (Code:SM-B-40A) I have discussed the findings and recommendations with the patient. If applicable, a reminder letter will be sent to the patient regarding the next appointment. BI-RADS CATEGORY  1: Negative. Electronically Signed   By: Everlean Alstrom M.D.   On: 09/16/2020 15:49      Assessment: 2 IUDs in place, wants them removed No strings and anterior myoma makes removal in office not feasible Patient Active Problem List   Diagnosis Date Noted  . Menorrhagia 09/04/2020  . Elevated troponin   . Attempted IUD removal, unsuccessful 07/05/2019  . Encounter for IUD insertion 07/05/2019  . Gastroesophageal reflux disease   . Nonspecific chest pain 11/11/2018  . Morbid obesity with body mass index (BMI) of 60.0 to 69.9 in adult ([place]) 11/11/2018  . Elevated d-dimer 11/11/2018  . SVT (supraventricular tachycardia) ([place]) 10/04/2018  . IUD (intrauterine device) in place 07/11/2015  . Other disorder of menstruation and other abnormal bleeding from female genital tract 10/30/2012    Plan: Hysteroscopic removal of 2 IUDs  [name] 09/17/2020 9:17 AM

## 2020-09-17 NOTE — Anesthesia Procedure Notes (Signed)
Procedure Name: Intubation Date/Time: 09/17/2020 9:54 AM Performed by: Ollen Bowl, CRNA Pre-anesthesia Checklist: Patient identified, Patient being monitored, Timeout performed, Emergency Drugs available and Suction available Patient Re-evaluated:Patient Re-evaluated prior to induction Oxygen Delivery Method: Circle system utilized Preoxygenation: Pre-oxygenation with 100% oxygen Induction Type: IV induction Ventilation: Mask ventilation without difficulty Laryngoscope Size: Mac and 3 Grade View: Grade I Tube type: Oral Tube size: 7.0 mm Number of attempts: 1 Airway Equipment and Method: Stylet Placement Confirmation: ETT inserted through vocal cords under direct vision,  positive ETCO2 and breath sounds checked- equal and bilateral Secured at: 21 cm Tube secured with: Tape Dental Injury: Teeth and Oropharynx as per pre-operative assessment

## 2020-09-17 NOTE — Anesthesia Preprocedure Evaluation (Signed)
Anesthesia Evaluation  Patient identified by MRN, date of birth, ID band Patient awake    Reviewed: Allergy & Precautions, H&P , NPO status , Patient's Chart, lab work & pertinent test results, reviewed documented beta blocker date and time   Airway Mallampati: I  TM Distance: >3 FB Neck ROM: full    Dental no notable dental hx.    Pulmonary shortness of breath, sleep apnea , Current Smoker,    Pulmonary exam normal breath sounds clear to auscultation       Cardiovascular Exercise Tolerance: Good hypertension, negative cardio ROS   Rhythm:regular Rate:Normal     Neuro/Psych negative neurological ROS  negative psych ROS   GI/Hepatic Neg liver ROS, GERD  Medicated,  Endo/Other  Morbid obesity  Renal/GU negative Renal ROS  negative genitourinary   Musculoskeletal   Abdominal   Peds  Hematology negative hematology ROS (+)   Anesthesia Other Findings   Reproductive/Obstetrics negative OB ROS                             Anesthesia Physical Anesthesia Plan  ASA: III  Anesthesia Plan: General and General ETT   Post-op Pain Management:    Induction:   PONV Risk Score and Plan: Ondansetron  Airway Management Planned:   Additional Equipment:   Intra-op Plan:   Post-operative Plan:   Informed Consent: I have reviewed the patients History and Physical, chart, labs and discussed the procedure including the risks, benefits and alternatives for the proposed anesthesia with the patient or authorized representative who has indicated his/her understanding and acceptance.     Dental Advisory Given  Plan Discussed with: CRNA  Anesthesia Plan Comments:         Anesthesia Quick Evaluation

## 2020-09-17 NOTE — Anesthesia Postprocedure Evaluation (Signed)
Anesthesia Post Note  Patient: Jessica Sanchez  Procedure(s) Performed: HYSTEROSCOPY (N/A ) INTRAUTERINE DEVICE (IUD) REMOVAL (2) (N/A )  Patient location during evaluation: Phase II Anesthesia Type: General Level of consciousness: awake Pain management: pain level controlled Vital Signs Assessment: post-procedure vital signs reviewed and stable Respiratory status: spontaneous breathing and respiratory function stable Cardiovascular status: blood pressure returned to baseline and stable Postop Assessment: no headache and no apparent nausea or vomiting Anesthetic complications: no Comments: Late entry   No complications documented.   Last Vitals:  Vitals:   09/17/20 1157 09/17/20 1215  BP:  107/72  Pulse:  72  Resp:  18  Temp:    SpO2: 99% 98%    Last Pain:  Vitals:   09/17/20 1200  TempSrc:   PainSc: [place]

## 2020-09-18 ENCOUNTER — Encounter (HOSPITAL_COMMUNITY): Payer: Self-pay | Admitting: Obstetrics & Gynecology

## 2020-09-30 ENCOUNTER — Encounter: Payer: Medicaid Other | Admitting: Obstetrics & Gynecology

## 2020-10-02 ENCOUNTER — Telehealth: Payer: Self-pay | Admitting: Obstetrics & Gynecology

## 2020-10-02 NOTE — Telephone Encounter (Signed)
Pt left voicemail requesting reschedule missed appointment on 5/31 & she has a question about her recent procedure Tried to call pt back - no answer & voicemail full

## 2020-10-17 ENCOUNTER — Ambulatory Visit (INDEPENDENT_AMBULATORY_CARE_PROVIDER_SITE_OTHER): Payer: Medicaid Other | Admitting: Obstetrics & Gynecology

## 2020-10-17 ENCOUNTER — Encounter: Payer: Self-pay | Admitting: Obstetrics & Gynecology

## 2020-10-17 ENCOUNTER — Other Ambulatory Visit: Payer: Self-pay

## 2020-10-17 VITALS — BP 133/86 | HR 71 | Ht 63.0 in | Wt 325.0 lb

## 2020-10-17 DIAGNOSIS — N920 Excessive and frequent menstruation with regular cycle: Secondary | ICD-10-CM

## 2020-10-17 DIAGNOSIS — Z30019 Encounter for initial prescription of contraceptives, unspecified: Secondary | ICD-10-CM

## 2020-10-17 MED ORDER — NORETHINDRONE 0.35 MG PO TABS: 1.0000 | ORAL_TABLET | Freq: Every day | ORAL | 11 refills | Status: DC

## 2020-10-17 NOTE — Progress Notes (Signed)
Chief Complaint  Patient presents with   Routine Post Op    Discuss birthcontrol options      35 y.o. G1P0010 Patient's last menstrual period was 09/18/2020. The current method of family planning is none.  Outpatient Encounter Medications as of 10/17/2020  Medication Sig Note   acetaminophen (TYLENOL) 325 MG tablet Take 650 mg by mouth every 6 (six) hours as needed for headache.    albuterol (PROVENTIL HFA) 108 (90 Base) MCG/ACT inhaler Inhale 1-2 puffs into the lungs every 6 (six) hours as needed for wheezing or shortness of breath.     flecainide (TAMBOCOR) 150 MG tablet Take 0.5 tablets (75 mg total) by mouth 2 (two) times daily.    Multiple Vitamins-Calcium (ONE-A-DAY WOMENS PO) Take 1 tablet by mouth daily.    norethindrone (MICRONOR) 0.35 MG tablet Take 1 tablet (0.35 mg total) by mouth daily.    ketorolac (TORADOL) 10 MG tablet Take 1 tablet (10 mg total) by mouth every 8 (eight) hours as needed. (Patient not taking: Reported on 10/17/2020)    methocarbamol (ROBAXIN) 500 MG tablet Take 1 tablet (500 mg total) by mouth at bedtime. (Patient not taking: Reported on 10/17/2020)    ondansetron (ZOFRAN ODT) 8 MG disintegrating tablet Take 1 tablet (8 mg total) by mouth every 8 (eight) hours as needed for nausea or vomiting. (Patient not taking: Reported on 10/17/2020)    [DISCONTINUED] apixaban (ELIQUIS) 5 MG TABS tablet Take 2 tablets (10mg ) twice daily for 7 days, then 1 tablet (5mg ) twice daily    [DISCONTINUED] levonorgestrel (MIRENA) 20 MCG/24HR IUD 1 each by Intrauterine route once.  02/13/2020: Patient thought that she may have 2 IUDs. The new one was inserted in May, 2020   No facility-administered encounter medications on file as of 10/17/2020.    Subjective 1 month post op from IUD removal x2 IUDs No problems post op Had a menstrual cycle lasted 9 days, heavy crampy, which she was not doing with IUD Past Medical History:  Diagnosis Date   Arthritis    right shoulder    Dyspnea    Dysrhythmia    hx of SVT   Fibroid (bleeding) (uterine)    Fibroid, uterine    H/O metrorrhagia    Hypertension    IUD (intrauterine device) in place 07/11/2015   Menorrhagia    Obesity, Class III, BMI 40-49.9 (morbid obesity) ([place]) 11/11/2018   Sleep apnea    uses CPAP - setting 11   SVT (supraventricular tachycardia) ([place])     Past Surgical History:  Procedure Laterality Date   HYSTEROSCOPY N/A 09/17/2020   Procedure: HYSTEROSCOPY;  Surgeon: [name], MD;  Location: [facility];  Service: Gynecology;  Laterality: N/A;   HYSTEROSCOPY WITH D & C N/A 09/27/2013   Procedure: DILATATION AND CURETTAGE /HYSTEROSCOPY and insertion of Mirena IUD ;  Surgeon: [name]. Harrington Challenger, MD;  Location: [facility];  Service: Gynecology;  Laterality: N/A;   IUD REMOVAL N/A 09/17/2020   Procedure: INTRAUTERINE DEVICE (IUD) REMOVAL (2);  Surgeon: [name], MD;  Location: [facility];  Service: Gynecology;  Laterality: N/A;   MYOMECTOMY  02/03/2011   Procedure: MYOMECTOMY;  Surgeon: [name], MD;  Location: [facility];  Service: Gynecology;  Laterality: N/A;   SVT ABLATION N/A 11/08/2018   Procedure: SVT ABLATION;  Surgeon: [name], MD;  Location: [facility];  Service: Cardiovascular;  Laterality: N/A;   TONSILLECTOMY      OB  History     Gravida  1   Para      Term      Preterm      AB  1   Living         SAB  1   IAB      Ectopic      Multiple      Live Births              Allergies  Allergen Reactions   Shellfish Allergy Shortness Of Breath, Itching and Swelling    Mouth became swollen, throat started itching, and developed shortness of breath   Sulfa Antibiotics Itching, Shortness Of Breath and Swelling    Mouth became swollen, throat started itching, and developed shortness of breath   Adhesive [Tape] Itching    Depends on the type of medical tape    Social History   Socioeconomic History   Marital status: Single    Spouse name: Not on file    Number of children: Not on file   Years of education: Not on file   Highest education level: Not on file  Occupational History   Not on file  Tobacco Use   Smoking status: Light Smoker    Pack years: 0.00   Smokeless tobacco: Never   Tobacco comments:    socially  Vaping Use   Vaping Use: Never used  Substance and Sexual Activity   Alcohol use: Yes    Comment: occasional   Drug use: No   Sexual activity: Not Currently    Birth control/protection: I.U.D.  Other Topics Concern   Not on file  Social History Narrative   Not on file   Social Determinants of Health   Financial Resource Strain: Not on file  Food Insecurity: Not on file  Transportation Needs: Not on file  Physical Activity: Not on file  Stress: Not on file  Social Connections: Not on file    Family History  Problem Relation Age of Onset   Cirrhosis Mother    Diabetes Mother    Cancer Maternal Grandfather    Hypertension Sister    Diabetes Sister    Anesthesia problems Neg Hx    Hypotension Neg Hx    Malignant hyperthermia Neg Hx    Pseudochol deficiency Neg Hx     Medications:       Current Outpatient Medications:    acetaminophen (TYLENOL) 325 MG tablet, Take 650 mg by mouth every 6 (six) hours as needed for headache., Disp: , Rfl:    albuterol (PROVENTIL HFA) 108 (90 Base) MCG/ACT inhaler, Inhale 1-2 puffs into the lungs every 6 (six) hours as needed for wheezing or shortness of breath. , Disp: , Rfl:    flecainide (TAMBOCOR) 150 MG tablet, Take 0.5 tablets (75 mg total) by mouth 2 (two) times daily., Disp: 90 tablet, Rfl: 1   Multiple Vitamins-Calcium (ONE-A-DAY WOMENS PO), Take 1 tablet by mouth daily., Disp: , Rfl:    norethindrone (MICRONOR) 0.35 MG tablet, Take 1 tablet (0.35 mg total) by mouth daily., Disp: 28 tablet, Rfl: 11   ketorolac (TORADOL) 10 MG tablet, Take 1 tablet (10 mg total) by mouth every 8 (eight) hours as needed. (Patient not taking: Reported on 10/17/2020), Disp: 15 tablet, Rfl:  0   methocarbamol (ROBAXIN) 500 MG tablet, Take 1 tablet (500 mg total) by mouth at bedtime. (Patient not taking: Reported on 10/17/2020), Disp: 60 tablet, Rfl: 1   ondansetron (ZOFRAN ODT) 8 MG disintegrating tablet, Take  1 tablet (8 mg total) by mouth every 8 (eight) hours as needed for nausea or vomiting. (Patient not taking: Reported on 10/17/2020), Disp: 8 tablet, Rfl: 0  Objective Blood pressure 133/86, pulse 71, height 5\' 3"  (1.6 m), weight (!) 325 lb (147.4 kg), last menstrual period 09/18/2020.  Gen WDWN NAD  Pertinent ROS No burning with urination, frequency or urgency No nausea, vomiting or diarrhea Nor fever chills or other constitutional symptoms   Labs or studies No new    Impression Diagnoses this Encounter::   ICD-10-CM   1. Encounter for female birth control  Z28.019     2. Menorrhagia with regular cycle  N92.0       Established relevant diagnosis(es):   Plan/Recommendations: Meds ordered this encounter  Medications   norethindrone (MICRONOR) 0.35 MG tablet    Sig: Take 1 tablet (0.35 mg total) by mouth daily.    Dispense:  28 tablet    Refill:  11    Labs or Scans Ordered: No orders of the defined types were placed in this encounter.   Management:: Begin progesterone only pill as first option  Follow up No follow-ups on file.   Begin progesterone only pill as first option   All questions were answered.

## 2020-10-30 ENCOUNTER — Telehealth: Payer: Self-pay | Admitting: Internal Medicine

## 2020-10-30 NOTE — Telephone Encounter (Signed)
Pt calling to report SVT in the 150's-170's at rest. She is symptomatic with SOB, dizziness, fatigue. She does not have any PRN medications at this time. She was advised to go to the ED for further evaluation and treatment. She will have a driver or call EMS for transportation.

## 2020-10-30 NOTE — Telephone Encounter (Signed)
STAT if HR is under 50 or over 120 (normal HR is 60-100 beats per minute)  What is your heart rate? 150  Do you have a log of your heart rate readings (document readings)?  10/30/20 150 12:30 PM 155 1:00 PM 160 4:10 PM 145 4:30 PM   Do you have any other symptoms? Dizziness and SOB, but has calmed down a little bit  STAT if patient feels like he/she is going to faint   Are you dizzy now? Little bit   Do you feel faint or have you passed out? Felt she was going to faint when it first occurred, but now just feel "woozy"  Do you have any other symptoms? Elevated HR and SOB off and on   Have you checked your HR and BP (record if available)? 150 has not checked BP  Pt c/o Shortness Of Breath: STAT if SOB developed within the last 24 hours or pt is noticeably SOB on the phone  1. Are you currently SOB (can you hear that pt is SOB on the phone)? No   2. How long have you been experiencing SOB? Started this afternoon   3. Are you SOB when sitting or when up moving around? Both   4. Are you currently experiencing any other symptoms? See above questions for other symptoms

## 2020-11-12 ENCOUNTER — Telehealth: Payer: Self-pay | Admitting: Internal Medicine

## 2020-11-12 ENCOUNTER — Emergency Department (HOSPITAL_COMMUNITY)
Admission: EM | Admit: 2020-11-12 | Discharge: 2020-11-12 | Disposition: A | Discharge status: Home / Self Care | Payer: 59 | Attending: Emergency Medicine | Admitting: Emergency Medicine

## 2020-11-12 ENCOUNTER — Other Ambulatory Visit: Payer: Self-pay

## 2020-11-12 ENCOUNTER — Encounter (HOSPITAL_COMMUNITY): Payer: Self-pay | Admitting: *Deleted

## 2020-11-12 ENCOUNTER — Emergency Department (HOSPITAL_COMMUNITY): Payer: 59

## 2020-11-12 DIAGNOSIS — I10 Essential (primary) hypertension: Secondary | ICD-10-CM | POA: Insufficient documentation

## 2020-11-12 DIAGNOSIS — R0789 Other chest pain: Secondary | ICD-10-CM | POA: Diagnosis not present

## 2020-11-12 DIAGNOSIS — R600 Localized edema: Secondary | ICD-10-CM | POA: Diagnosis not present

## 2020-11-12 DIAGNOSIS — I471 Supraventricular tachycardia: Secondary | ICD-10-CM | POA: Insufficient documentation

## 2020-11-12 DIAGNOSIS — Z7901 Long term (current) use of anticoagulants: Secondary | ICD-10-CM | POA: Diagnosis not present

## 2020-11-12 DIAGNOSIS — R69 Illness, unspecified: Secondary | ICD-10-CM | POA: Diagnosis not present

## 2020-11-12 DIAGNOSIS — R079 Chest pain, unspecified: Secondary | ICD-10-CM | POA: Diagnosis not present

## 2020-11-12 DIAGNOSIS — F17200 Nicotine dependence, unspecified, uncomplicated: Secondary | ICD-10-CM | POA: Diagnosis not present

## 2020-11-12 DIAGNOSIS — I517 Cardiomegaly: Secondary | ICD-10-CM | POA: Diagnosis not present

## 2020-11-12 LAB — CBC WITH DIFFERENTIAL/PLATELET
Abs Immature Granulocytes: 0.03 10*3/uL (ref 0.00–0.07)
Basophils Absolute: 0 10*3/uL (ref 0.0–0.1)
Basophils Relative: 0 %
Eosinophils Absolute: 0.1 10*3/uL (ref 0.0–0.5)
Eosinophils Relative: 2 %
HCT: 36.4 % (ref 36.0–46.0)
Hemoglobin: 11.6 g/dL — ABNORMAL LOW (ref 12.0–15.0)
Immature Granulocytes: 1 %
Lymphocytes Relative: 34 %
Lymphs Abs: 1.9 10*3/uL (ref 0.7–4.0)
MCH: 27.9 pg (ref 26.0–34.0)
MCHC: 31.9 g/dL (ref 30.0–36.0)
MCV: 87.5 fL (ref 80.0–100.0)
Monocytes Absolute: 0.5 10*3/uL (ref 0.1–1.0)
Monocytes Relative: 9 %
Neutro Abs: 3 10*3/uL (ref 1.7–7.7)
Neutrophils Relative %: 54 %
Platelets: 296 10*3/uL (ref 150–400)
RBC: 4.16 MIL/uL (ref 3.87–5.11)
RDW: 14.7 % (ref 11.5–15.5)
WBC: 5.5 10*3/uL (ref 4.0–10.5)
nRBC: 0 % (ref 0.0–0.2)

## 2020-11-12 LAB — BASIC METABOLIC PANEL
Anion gap: 7 (ref 5–15)
BUN: 12 mg/dL (ref 6–20)
CO2: 25 mmol/L (ref 22–32)
Calcium: 8.7 mg/dL — ABNORMAL LOW (ref 8.9–10.3)
Chloride: 103 mmol/L (ref 98–111)
Creatinine, Ser: 0.72 mg/dL (ref 0.44–1.00)
GFR, Estimated: >60 mL/min (ref >60)
Glucose, Bld: 89 mg/dL (ref 70–99)
Potassium: 3.7 mmol/L (ref 3.5–5.1)
Sodium: 135 mmol/L (ref 135–145)

## 2020-11-12 LAB — D-DIMER, QUANTITATIVE: D-Dimer, Quant: 0.44 ug/mL-FEU (ref 0.00–0.50)

## 2020-11-12 LAB — TROPONIN I (HIGH SENSITIVITY): Troponin I (High Sensitivity): <2 ng/L (ref <18)

## 2020-11-12 NOTE — ED Triage Notes (Signed)
Pt with cp for 1.5 hours while at work.  IV started to rt wrist and 324 mg ASA given and 1 SL NTG with no change per EMS report.   Pt states mid cp with heaviness like an elephant sitting on her chest.

## 2020-11-12 NOTE — Telephone Encounter (Signed)
Patient c/o Palpitations:  High priority if patient c/o lightheadedness, shortness of breath, or chest pain  How long have you had palpitations/irregular HR/ Afib? Are you having the symptoms now? Last few days. Patient between 6 and 7 but then went back up  Are you currently experiencing lightheadedness, SOB or CP? A little chest pains  Do you have a history of afib (atrial fibrillation) or irregular heart rhythm? yes  Have you checked your BP or HR? (document readings if available): HR 123  Are you experiencing any other symptoms? No

## 2020-11-12 NOTE — Telephone Encounter (Signed)
Spoke with the patient who states that she has had several more episodes of increased heart rate over the past couple of days. On 7/11 heart rate was elevated for about two hours while she was at work. Yesterday it was elevated for about an hour and a half. She reports this morning HR was 153-160. She took an extra 1/2 tablet of flecainide. Heart rate is now in the 120s. She denies any symptoms other than just feeling uncomfortable. She is resting currently. She is currently scheduled to see Dr. Lovena Le in regards to these episodes on 8/9 but wondering if she can be seen sooner. Advised that I do not see any availability. Patient advised to rest and continue to monitor. Advised to have someone take her to the ER if she becomes symptomatic.

## 2020-11-12 NOTE — Discharge Instructions (Addendum)
Respond to Dr. Tanna Furry message in your MyChart regarding his recommendation to increase your flecainide so he can prescribe this increased dose.  This should help reduce your episodes of tachycardia.    Your work up tonight is reassuring.

## 2020-11-12 NOTE — ED Provider Notes (Signed)
[facility] EMERGENCY DEPARTMENT Provider Note   CSN: [id number] Arrival date & time: 11/12/20  1726     History Chief Complaint  Patient presents with   Chest Pain    Jessica Sanchez is a 35 y.o. female with a history including SVT under the care of Dr. Lovena Le and takes flecainide 75 mg twice daily for this condition presenting for evaluation of multiple episodes of tachycardia to 160 since last night.  She states she had 2 episodes while trying to sleep each lasting for more than 30 minutes.  She has been compliant with her procainamide and was told by Dr. Lovena Le she can take an extra tablet if she has an episode which she did and improved her symptoms but only for several hours before she had a second episode.  She went to work this afternoon and started to feel lightheaded along with midsternal chest pressure and subjective tachycardia.  Seen by her work Therapist, sports who measured her rate but did not tell her the rate  She denies any recent illnesses, she denies nausea or vomiting, abdominal pain or shortness of breath.  She does report bilateral ankle edema which is worse when she is on her feet for long periods at work and has noted some swelling in her right leg which is a chronic finding with prolonged standing.  No history of DVTs although she states she has been evaluated for this condition in the past.  Pt feeling improved at this time, she believes she may have been having an anxiety/panic episode.  Per review of chart she had contacted Dr. Lovena Le this morning and through My chart messaging, they recommended that she increase her flecainide to 100 mg twice daily.  SVt ablation 2020 - not effective Exercise tolerance test 09/2019 - no ischemia  The history is provided by the patient.   HPI: A 35 year old patient with a history of hypertension and obesity presents for evaluation of chest pain. Initial onset of pain was more than 6 hours ago. The patient's chest pain is described as  heaviness/pressure/tightness and is not worse with exertion. The patient's chest pain is middle- or left-sided, is not well-localized, is not sharp and does not radiate to the arms/jaw/neck. The patient does not complain of nausea and denies diaphoresis. The patient has no history of stroke, has no history of peripheral artery disease, has not smoked in the past 90 days, denies any history of treated diabetes, has no relevant family history of coronary artery disease (first degree relative at less than age 8) and has no history of hypercholesterolemia.   Past Medical History:  Diagnosis Date   Arthritis    right shoulder   Dyspnea    Dysrhythmia    hx of SVT   Fibroid (bleeding) (uterine)    Fibroid, uterine    H/O metrorrhagia    Hypertension    IUD (intrauterine device) in place 07/11/2015   Menorrhagia    Obesity, Class III, BMI 40-49.9 (morbid obesity) ([place]) 11/11/2018   Sleep apnea    uses CPAP - setting 11   SVT (supraventricular tachycardia) ([place])     Patient Active Problem List   Diagnosis Date Noted   Encounter for IUD removal    Menorrhagia 09/04/2020   Elevated troponin    Attempted IUD removal, unsuccessful 07/05/2019   Encounter for IUD insertion 07/05/2019   Gastroesophageal reflux disease    Nonspecific chest pain 11/11/2018   Morbid obesity with body mass index (BMI) of 60.0 to 69.9  in adult [facility]) 11/11/2018   Elevated d-dimer 11/11/2018   SVT (supraventricular tachycardia) ([place]) 10/04/2018   IUD (intrauterine device) in place 07/11/2015   Other disorder of menstruation and other abnormal bleeding from female genital tract 10/30/2012    Past Surgical History:  Procedure Laterality Date   HYSTEROSCOPY N/A 09/17/2020   Procedure: HYSTEROSCOPY;  Surgeon: [name], MD;  Location: [facility];  Service: Gynecology;  Laterality: N/A;   HYSTEROSCOPY WITH D & C N/A 09/27/2013   Procedure: DILATATION AND CURETTAGE /HYSTEROSCOPY and insertion of Mirena IUD ;  Surgeon:  [name]. Harrington Challenger, MD;  Location: [place] [facility];  Service: Gynecology;  Laterality: N/A;   IUD REMOVAL N/A 09/17/2020   Procedure: INTRAUTERINE DEVICE (IUD) REMOVAL (2);  Surgeon: [name], MD;  Location: [facility];  Service: Gynecology;  Laterality: N/A;   MYOMECTOMY  02/03/2011   Procedure: MYOMECTOMY;  Surgeon: [name], MD;  Location: [facility];  Service: Gynecology;  Laterality: N/A;   SVT ABLATION N/A 11/08/2018   Procedure: SVT ABLATION;  Surgeon: [name], MD;  Location: [place] [facility];  Service: Cardiovascular;  Laterality: N/A;   TONSILLECTOMY       OB History     Gravida  1   Para      Term      Preterm      AB  1   Living         SAB  1   IAB      Ectopic      Multiple      Live Births              Family History  Problem Relation Age of Onset   Cirrhosis Mother    Diabetes Mother    Cancer Maternal Grandfather    Hypertension Sister    Diabetes Sister    Anesthesia problems Neg Hx    Hypotension Neg Hx    Malignant hyperthermia Neg Hx    Pseudochol deficiency Neg Hx     Social History   Tobacco Use   Smoking status: Light Smoker    Pack years: 0.00   Smokeless tobacco: Never   Tobacco comments:    socially  Vaping Use   Vaping Use: Never used  Substance Use Topics   Alcohol use: Yes    Comment: occasional   Drug use: No    Home Medications Prior to Admission medications   Medication Sig Start Date End Date Taking? Authorizing Provider  acetaminophen (TYLENOL) 325 MG tablet Take 650 mg by mouth every 6 (six) hours as needed for headache.    [provider]  albuterol (PROVENTIL HFA) 108 (90 Base) MCG/ACT inhaler Inhale 1-2 puffs into the lungs every 6 (six) hours as needed for wheezing or shortness of breath.     [provider]  flecainide (TAMBOCOR) 150 MG tablet Take 0.5 tablets (75 mg total) by mouth 2 (two) times daily. 10/30/19   [name], MD  ketorolac (TORADOL) 10 MG tablet Take 1 tablet (10 mg  total) by mouth every 8 (eight) hours as needed. Patient not taking: Reported on 10/17/2020 09/17/20   [name], MD  methocarbamol (ROBAXIN) 500 MG tablet Take 1 tablet (500 mg total) by mouth at bedtime. Patient not taking: Reported on 10/17/2020 09/04/20   Carole Civil, MD  Multiple Vitamins-Calcium (ONE-A-DAY WOMENS PO) Take 1 tablet by mouth daily.    [provider]  norethindrone (MICRONOR) 0.35 MG  tablet Take 1 tablet (0.35 mg total) by mouth daily. 10/17/20   [name], MD  ondansetron (ZOFRAN ODT) 8 MG disintegrating tablet Take 1 tablet (8 mg total) by mouth every 8 (eight) hours as needed for nausea or vomiting. Patient not taking: Reported on 10/17/2020 09/17/20   [name], MD  apixaban (ELIQUIS) 5 MG TABS tablet Take 2 tablets (10mg ) twice daily for 7 days, then 1 tablet (5mg ) twice daily 04/20/19 04/20/19  Rodell Perna A, PA-C  levonorgestrel (MIRENA) 20 MCG/24HR IUD 1 each by Intrauterine route once.   06/25/20  [provider]    Allergies    Shellfish allergy, Sulfa antibiotics, and Adhesive [tape]  Review of Systems   Review of Systems  Constitutional:  Negative for chills and fever.  HENT: Negative.    Eyes: Negative.   Respiratory:  Positive for chest tightness. Negative for shortness of breath.   Cardiovascular:  Positive for palpitations and leg swelling. Negative for chest pain.  Gastrointestinal:  Negative for abdominal pain, nausea and vomiting.  Genitourinary: Negative.   Musculoskeletal:  Negative for arthralgias, joint swelling and neck pain.  Skin: Negative.  Negative for rash and wound.  Neurological:  Positive for light-headedness. Negative for numbness and headaches.  Psychiatric/Behavioral: Negative.    All other systems reviewed and are negative.  Physical Exam Updated Vital Signs BP 122/71   Pulse 72   Temp (!) 97.5 F (36.4 C) (Oral)   Resp 20   Ht 5\' 2"  (1.575 m)   Wt (!) 147 kg   LMP 10/31/2020   SpO2 98%    BMI 59.26 kg/m   Physical Exam Vitals and nursing note reviewed.  Constitutional:      Appearance: She is well-developed.  HENT:     Head: Normocephalic and atraumatic.  Eyes:     Conjunctiva/sclera: Conjunctivae normal.  Cardiovascular:     Rate and Rhythm: Normal rate and regular rhythm.     Heart sounds: Normal heart sounds. No murmur heard. Pulmonary:     Effort: Pulmonary effort is normal.     Breath sounds: Normal breath sounds. No wheezing, rhonchi or rales.  Abdominal:     General: Bowel sounds are normal.     Palpations: Abdomen is soft.     Tenderness: There is no abdominal tenderness.  Musculoskeletal:        General: Normal range of motion.     Cervical back: Normal range of motion.     Right lower leg: No tenderness. Edema present.     Left lower leg: Edema present.     Comments: Trace bilateral ankle edema  Skin:    General: Skin is warm and dry.  Neurological:     Mental Status: She is alert.    ED Results / Procedures / Treatments   Labs (all labs ordered are listed, but only abnormal results are displayed) Labs Reviewed  CBC WITH DIFFERENTIAL/PLATELET - Abnormal; Notable for the following components:      Result Value   Hemoglobin 11.6 (*)    All other components within normal limits  BASIC METABOLIC PANEL - Abnormal; Notable for the following components:   Calcium 8.7 (*)    All other components within normal limits  D-DIMER, QUANTITATIVE  TROPONIN I (HIGH SENSITIVITY)  TROPONIN I (HIGH SENSITIVITY)    EKG EKG Interpretation  Date/Time:  Wednesday November 12 2020 17:37:28 EDT Ventricular Rate:  81 PR Interval:  150 QRS Duration: 94 QT Interval:  377 QTC Calculation:  438 R Axis:   14 Text Interpretation: Sinus rhythm Low voltage, precordial leads Borderline T abnormalities, anterior leads Confirmed by Nanda Quinton [phone]) on 11/12/2020 5:57:56 PM  Radiology DG Chest Portable 1 View  Result Date: 11/12/2020 CLINICAL DATA:  Chest pain EXAM:  PORTABLE CHEST 1 VIEW COMPARISON:  August 09, 2020 FINDINGS: Similar cardiac enlargement. Central vascular prominence. Streaky bibasilar opacities, favor atelectasis. No overt pulmonary edema. Both lungs are clear. The visualized skeletal structures are unremarkable. IMPRESSION: 1. Stable cardiomegaly with central vascular prominence, without overt pulmonary edema. 2. Streaky bibasilar opacities, favor atelectasis. Electronically Signed   By: Dahlia Bailiff MD   On: 11/12/2020 18:56    Procedures Procedures   Medications Ordered in ED Medications - No data to display  ED Course  I have reviewed the triage vital signs and the nursing notes.  Pertinent labs & imaging results that were available during my care of the patient were reviewed by me and considered in my medical decision making (see chart for details).    MDM Rules/Calculators/A&P HEAR Score: 3                        Pt with mid sternal chest pressure since around 3 pm today in association with episodes of breakthrough tachycardia in pt with known SVT.  Sinus rhythm here,  ekg reassuring with normal troponin. Doubt ACS.  D dimer is negative, with pt being perc negative, PE unlikely.    Per Mychart notes this am,  her cardiologist has suggested increasing her Flecainide dosing. Pt advised respond to this messaging so Dr Lovena Le can change her prescription. Pt stable at time of dc.  Discussed with Dr Laverta Baltimore prior to dc home.      Final Clinical Impression(s) / ED Diagnoses Final diagnoses:  SVT (supraventricular tachycardia) [facility])    Rx / DC Orders ED Discharge Orders     None        Landis Martins 11/12/20 1951    Margette Fast, MD 11/17/20 2209

## 2020-11-12 NOTE — Telephone Encounter (Signed)
Sent mychart message

## 2020-11-13 ENCOUNTER — Telehealth: Payer: Self-pay | Admitting: Internal Medicine

## 2020-11-13 MED ORDER — FLECAINIDE ACETATE 100 MG PO TABS: 100.0000 mg | ORAL_TABLET | Freq: Two times a day (BID) | ORAL | 0 refills | Status: DC

## 2020-11-13 NOTE — Telephone Encounter (Signed)
See mychart response.  Advised would discuss with Dr. Lovena Le.

## 2020-11-13 NOTE — Telephone Encounter (Signed)
New Message:     Patient had to go to the ER yesterday for Chest Pain, Short of Breath and high heart rate.. had to increase her Flecainide. Her question is will increasing the Flecainide interfere with her working? She wants to know if Dr Lovena Le think she should stay out of work or do light duty until she see him on 12-09-20. She have been having these episodes quite frequently.

## 2020-11-13 NOTE — Addendum Note (Signed)
Addended by: Willeen Cass A on: 11/13/2020 07:39 AM   Modules accepted: Orders

## 2020-11-18 NOTE — Telephone Encounter (Signed)
Sent MyChart message advising per Dr. Dola Factor duty indicated.

## 2020-11-20 ENCOUNTER — Ambulatory Visit: Payer: 59

## 2020-11-20 NOTE — Telephone Encounter (Signed)
Left message to return call 

## 2020-11-21 ENCOUNTER — Ambulatory Visit: Payer: 59

## 2020-11-21 NOTE — Telephone Encounter (Signed)
MyChart message was sent to patient with Dr.Taylors response.

## 2020-11-26 ENCOUNTER — Ambulatory Visit: Payer: 59

## 2020-11-28 ENCOUNTER — Ambulatory Visit (INDEPENDENT_AMBULATORY_CARE_PROVIDER_SITE_OTHER): Payer: 59

## 2020-11-28 ENCOUNTER — Other Ambulatory Visit: Payer: Self-pay

## 2020-11-28 VITALS — BP 118/76 | HR 84

## 2020-11-28 DIAGNOSIS — I471 Supraventricular tachycardia: Secondary | ICD-10-CM

## 2020-11-28 NOTE — Progress Notes (Signed)
Nurse visit for EKG after Flecainide increased to 100 mg BID.Patient is in [place] with HR 84. She will continue medications as directed. Will forward EKG to Dr.Taylor for review.   Jessica Sanchez understands that she may work without restrictions at this time.

## 2020-11-28 NOTE — Patient Instructions (Signed)
Continue all medications as directed. We will forward EKG to Dr.taylor for review. We will call you if there are any changes to be made with medications.

## 2020-12-05 ENCOUNTER — Other Ambulatory Visit: Payer: Self-pay

## 2020-12-05 ENCOUNTER — Emergency Department (HOSPITAL_COMMUNITY)
Admission: EM | Admit: 2020-12-05 | Discharge: 2020-12-05 | Disposition: A | Discharge status: Home / Self Care | Payer: 59 | Attending: Emergency Medicine | Admitting: Emergency Medicine

## 2020-12-05 ENCOUNTER — Encounter (HOSPITAL_COMMUNITY): Payer: Self-pay | Admitting: *Deleted

## 2020-12-05 DIAGNOSIS — I10 Essential (primary) hypertension: Secondary | ICD-10-CM | POA: Diagnosis not present

## 2020-12-05 DIAGNOSIS — F17200 Nicotine dependence, unspecified, uncomplicated: Secondary | ICD-10-CM | POA: Diagnosis not present

## 2020-12-05 DIAGNOSIS — R519 Headache, unspecified: Secondary | ICD-10-CM | POA: Diagnosis not present

## 2020-12-05 DIAGNOSIS — Z7901 Long term (current) use of anticoagulants: Secondary | ICD-10-CM | POA: Insufficient documentation

## 2020-12-05 DIAGNOSIS — G44209 Tension-type headache, unspecified, not intractable: Secondary | ICD-10-CM | POA: Diagnosis not present

## 2020-12-05 DIAGNOSIS — R69 Illness, unspecified: Secondary | ICD-10-CM | POA: Diagnosis not present

## 2020-12-05 MED ORDER — METOCLOPRAMIDE HCL 10 MG PO TABS: 10.0000 mg | ORAL_TABLET | Freq: Three times a day (TID) | ORAL | 0 refills | Status: DC | PRN

## 2020-12-05 MED ORDER — METOCLOPRAMIDE HCL 5 MG/ML IJ SOLN
10.0000 mg | Freq: Once | INTRAMUSCULAR | Status: AC
  Administered 2020-12-05: 10 mg via INTRAMUSCULAR
  Filled 2020-12-05: qty 2

## 2020-12-05 MED ORDER — DIPHENHYDRAMINE HCL 50 MG/ML IJ SOLN
25.0000 mg | Freq: Once | INTRAMUSCULAR | Status: AC
  Administered 2020-12-05: 25 mg via INTRAMUSCULAR
  Filled 2020-12-05: qty 1

## 2020-12-05 MED ORDER — KETOROLAC TROMETHAMINE 60 MG/2ML IM SOLN
60.0000 mg | Freq: Once | INTRAMUSCULAR | Status: AC
  Administered 2020-12-05: 60 mg via INTRAMUSCULAR
  Filled 2020-12-05: qty 2

## 2020-12-05 NOTE — ED Provider Notes (Signed)
[facility] EMERGENCY DEPARTMENT Provider Note   CSN: [id number] Arrival date & time: 12/05/20  1010     History Chief Complaint  Patient presents with   Headache    Jessica Sanchez is a 35 y.o. female.   Headache   35 y/o female -she has a history of dysrhythmias and is currently on flecainide.  She denies any other chronic medical conditions.  She does report that she has been having some headaches intermittently over the last several years, this is not new for her however she has had a worsening headache over the last several days.  It is left sided - it is throbbing and stabbing, it is radiating into the neck and hurts to turn her head but only when the headache comes on.  There are times when she is pain-free, she has been taking ibuprofen and Tylenol alternating with no significant relief.  She is not nauseated vomiting and has no diarrhea coughing shortness of breath chills or fevers.  Past Medical History:  Diagnosis Date   Arthritis    right shoulder   Dyspnea    Dysrhythmia    hx of SVT   Fibroid (bleeding) (uterine)    Fibroid, uterine    H/O metrorrhagia    Hypertension    IUD (intrauterine device) in place 07/11/2015   Menorrhagia    Obesity, Class III, BMI 40-49.9 (morbid obesity) ([place]) 11/11/2018   Sleep apnea    uses CPAP - setting 11   SVT (supraventricular tachycardia) ([place])     Patient Active Problem List   Diagnosis Date Noted   Encounter for IUD removal    Menorrhagia 09/04/2020   Elevated troponin    Attempted IUD removal, unsuccessful 07/05/2019   Encounter for IUD insertion 07/05/2019   Gastroesophageal reflux disease    Nonspecific chest pain 11/11/2018   Morbid obesity with body mass index (BMI) of 60.0 to 69.9 in adult [facility]) 11/11/2018   Elevated d-dimer 11/11/2018   SVT (supraventricular tachycardia) ([place]) 10/04/2018   IUD (intrauterine device) in place 07/11/2015   Other disorder of menstruation and other abnormal bleeding from female  genital tract 10/30/2012    Past Surgical History:  Procedure Laterality Date   HYSTEROSCOPY N/A 09/17/2020   Procedure: HYSTEROSCOPY;  Surgeon: [name], MD;  Location: [facility];  Service: Gynecology;  Laterality: N/A;   HYSTEROSCOPY WITH D & C N/A 09/27/2013   Procedure: DILATATION AND CURETTAGE /HYSTEROSCOPY and insertion of Mirena IUD ;  Surgeon: [name]. Harrington Challenger, MD;  Location: [place] [facility];  Service: Gynecology;  Laterality: N/A;   IUD REMOVAL N/A 09/17/2020   Procedure: INTRAUTERINE DEVICE (IUD) REMOVAL (2);  Surgeon: [name], MD;  Location: [facility];  Service: Gynecology;  Laterality: N/A;   MYOMECTOMY  02/03/2011   Procedure: MYOMECTOMY;  Surgeon: [name], MD;  Location: [facility];  Service: Gynecology;  Laterality: N/A;   SVT ABLATION N/A 11/08/2018   Procedure: SVT ABLATION;  Surgeon: [name], MD;  Location: [place] [facility];  Service: Cardiovascular;  Laterality: N/A;   TONSILLECTOMY       OB History     Gravida  1   Para      Term      Preterm      AB  1   Living         SAB  1   IAB      Ectopic      Multiple  Live Births              Family History  Problem Relation Age of Onset   Cirrhosis Mother    Diabetes Mother    Cancer Maternal Grandfather    Hypertension Sister    Diabetes Sister    Anesthesia problems Neg Hx    Hypotension Neg Hx    Malignant hyperthermia Neg Hx    Pseudochol deficiency Neg Hx     Social History   Tobacco Use   Smoking status: Light Smoker   Smokeless tobacco: Never   Tobacco comments:    socially  Vaping Use   Vaping Use: Never used  Substance Use Topics   Alcohol use: Yes    Comment: occasional   Drug use: No    Home Medications Prior to Admission medications   Medication Sig Start Date End Date Taking? Authorizing Provider  metoCLOPramide (REGLAN) 10 MG tablet Take 1 tablet (10 mg total) by mouth every 8 (eight) hours as needed for nausea. 12/05/20  Yes Noemi Chapel, MD   acetaminophen (TYLENOL) 325 MG tablet Take 650 mg by mouth every 6 (six) hours as needed for headache.    [provider]  albuterol (PROVENTIL HFA) 108 (90 Base) MCG/ACT inhaler Inhale 1-2 puffs into the lungs every 6 (six) hours as needed for wheezing or shortness of breath.     [provider]  flecainide (TAMBOCOR) 100 MG tablet Take 1 tablet (100 mg total) by mouth 2 (two) times daily. 11/13/20   [name], MD  ketorolac (TORADOL) 10 MG tablet Take 1 tablet (10 mg total) by mouth every 8 (eight) hours as needed. 09/17/20   [name], MD  methocarbamol (ROBAXIN) 500 MG tablet Take 1 tablet (500 mg total) by mouth at bedtime. 09/04/20   Carole Civil, MD  Multiple Vitamins-Calcium (ONE-A-DAY WOMENS PO) Take 1 tablet by mouth daily.    [provider]  norethindrone (MICRONOR) 0.35 MG tablet Take 1 tablet (0.35 mg total) by mouth daily. 10/17/20   [name], MD  ondansetron (ZOFRAN ODT) 8 MG disintegrating tablet Take 1 tablet (8 mg total) by mouth every 8 (eight) hours as needed for nausea or vomiting. 09/17/20   [name], MD  apixaban (ELIQUIS) 5 MG TABS tablet Take 2 tablets ('10mg'$ ) twice daily for 7 days, then 1 tablet ('5mg'$ ) twice daily 04/20/19 04/20/19  Rodell Perna A, PA-C  levonorgestrel (MIRENA) 20 MCG/24HR IUD 1 each by Intrauterine route once.   06/25/20  [provider]    Allergies    Shellfish allergy, Sulfa antibiotics, and Adhesive [tape]  Review of Systems   Review of Systems  Neurological:  Positive for headaches.  All other systems reviewed and are negative.  Physical Exam Updated Vital Signs BP 119/77 (BP Location: Right Arm)   Pulse (!) 59   Temp 98.3 F (36.8 C) (Oral)   Resp 18   LMP 12/01/2020   SpO2 100%   Physical Exam Vitals and nursing note reviewed.  Constitutional:      General: She is not in acute distress.    Appearance: She is well-developed.  HENT:     Head: Normocephalic and atraumatic.      Mouth/Throat:     Pharynx: No oropharyngeal exudate.  Eyes:     General: No scleral icterus.       Right eye: No discharge.        Left eye: No discharge.     Conjunctiva/sclera:  Conjunctivae normal.     Pupils: Pupils are equal, round, and reactive to light.  Neck:     Thyroid: No thyromegaly.     Vascular: No JVD.  Cardiovascular:     Rate and Rhythm: Normal rate and regular rhythm.     Heart sounds: Normal heart sounds. No murmur heard.   No friction rub. No gallop.  Pulmonary:     Effort: Pulmonary effort is normal. No respiratory distress.     Breath sounds: Normal breath sounds. No wheezing or rales.  Abdominal:     General: Bowel sounds are normal. There is no distension.     Palpations: Abdomen is soft. There is no mass.     Tenderness: There is no abdominal tenderness.  Musculoskeletal:        General: Tenderness present. Normal range of motion.     Cervical back: Normal range of motion and neck supple.     Comments: All 4 extremities are supple, soft, compartments are soft, joints are supple, no tenderness, except for the left lateral neck muscles which are tender to the touch down into the shoulder  Lymphadenopathy:     Cervical: No cervical adenopathy.  Skin:    General: Skin is warm and dry.     Findings: No erythema or rash.  Neurological:     Mental Status: She is alert.     Coordination: Coordination normal.     Comments: Speech is clear, cranial nerves III through XII are intact, memory is intact, strength is normal in all 4 extremities including grips, sensation is intact to light touch and pinprick in all 4 extremities. Coordination as tested by finger-nose-finger is normal, no limb ataxia. Normal gait, normal reflexes at the patellar tendons bilaterally  Psychiatric:        Behavior: Behavior normal.    ED Results / Procedures / Treatments   Labs (all labs ordered are listed, but only abnormal results are displayed) Labs Reviewed - No data to  display  EKG None  Radiology No results found.  Procedures Procedures   Medications Ordered in ED Medications  metoCLOPramide (REGLAN) injection 10 mg (10 mg Intramuscular Given 12/05/20 1121)  ketorolac (TORADOL) injection 60 mg (60 mg Intramuscular Given 12/05/20 1121)  diphenhydrAMINE (BENADRYL) injection 25 mg (25 mg Intramuscular Given 12/05/20 1121)    ED Course  I have reviewed the triage vital signs and the nursing notes.  Pertinent labs & imaging results that were available during my care of the patient were reviewed by me and considered in my medical decision making (see chart for details).  Clinical Course as of 12/05/20 1346  Fri Dec 05, 2020  1344 Rechecked at 1:45, improved - pain is almost gone - well apeparing, aggreaable to d/c [BM]    Clinical Course User Index [BM] Noemi Chapel, MD   MDM Rules/Calculators/A&P                           Overall this patient is very well-appearing with normal vital signs, she is afebrile, she does not have a stiff neck in fact she is rather supple, I suspect that she has a tension type headache, she will get medications for the same, I do not think she needs a spinal tap to rule out meningitis, this does not appear to be consistent with an acute stroke or mass or tumor or aneurysm.  The patient is agreeable to the plan, injectable metoclopramide and Toradol.  Final  Clinical Impression(s) / ED Diagnoses Final diagnoses:  Acute non intractable tension-type headache    Rx / DC Orders ED Discharge Orders          Ordered    metoCLOPramide (REGLAN) 10 MG tablet  Every 8 hours PRN        12/05/20 1345             Noemi Chapel, MD 12/05/20 1346

## 2020-12-05 NOTE — Discharge Instructions (Addendum)
Reglan every 8 hours as needed if ibuprofen does not get rid of your headache  ER for worsening symptoms.  Thank you for letting us take care of you today!  Please obtain all of your results from medical records or have your doctors office obtain the results - share them with your doctor - you should be seen at your doctors office in the next 2 days. Call today to arrange your follow up. Take the medications as prescribed. Please review all of the medicines and only take them if you do not have an allergy to them. Please be aware that if you are taking birth control pills, taking other prescriptions, ESPECIALLY ANTIBIOTICS may make the birth control ineffective - if this is the case, either do not engage in sexual activity or use alternative methods of birth control such as condoms until you have finished the medicine and your family doctor says it is OK to restart them. If you are on a blood thinner such as COUMADIN, be aware that any other medicine that you take may cause the coumadin to either work too much, or not enough - you should have your coumadin level rechecked in next 7 days if this is the case.  ?  It is also a possibility that you have an allergic reaction to any of the medicines that you have been prescribed - Everybody reacts differently to medications and while MOST people have no trouble with most medicines, you may have a reaction such as nausea, vomiting, rash, swelling, shortness of breath. If this is the case, please stop taking the medicine immediately and contact your physician.   If you were given a medication in the ED such as percocet, vicodin, or morphine, be aware that these medicines are sedating and may change your ability to take care of yourself adequately for several hours after being given this medicines - you should not drive or take care of small children if you were given this medicine in the Emergency Department or if you have been prescribed these types of  medicines. ?   You should return to the ER IMMEDIATELY if you develop severe or worsening symptoms.    [facility] Primary Care Doctor List    Sinda Du MD. Specialty: Pulmonary Disease Contact information: [address]  [place] [address]  [phone]   Tula Nakayama, MD. Specialty: [facility] Medicine Contact information: [address] [place] [address]  [phone]   Sallee Lange, MD. Specialty: [facility] Medicine Contact information: [address]  [place] [address]  [phone]   Rosita Fire, MD Specialty: Internal Medicine Contact information: [place] [address]  [phone]   Delphina Cahill, MD. Specialty: Internal Medicine Contact information: [place] [address]  [phone]    [facility] (Dr. Maudie Mercury) Specialty: Family Medicine Contact information: [place] [address]  [phone]   Leslie Andrea, MD. Specialty: [facility] Medicine Contact information: [place] [address]  [phone]   Asencion Noble, MD. Specialty: Internal Medicine Contact information: [address]  [place] [address]  [place]  [address] [place], [place] [address] [phone]  Services [facility] offers a variety of basic health services.  Services include but  are not limited to: Blood pressure checks  Heart rate checks  Blood sugar checks  Urine analysis  Rapid strep tests  Pregnancy tests.  Health education and referrals  People needing more complex services will be directed to a physician online. Using these virtual visits, doctors can evaluate and prescribe medicine and treatments. There will be no medication on-site, though [facility] will help patients fill their prescriptions at little to no  cost.   For More information please go to: [email or web address]

## 2020-12-05 NOTE — ED Triage Notes (Signed)
Headache x 3 days

## 2020-12-09 ENCOUNTER — Encounter: Payer: Self-pay | Admitting: Internal Medicine

## 2020-12-09 ENCOUNTER — Other Ambulatory Visit: Payer: Self-pay

## 2020-12-09 ENCOUNTER — Other Ambulatory Visit (HOSPITAL_COMMUNITY)
Admission: RE | Admit: 2020-12-09 | Discharge: 2020-12-09 | Disposition: A | Discharge status: Home / Self Care | Payer: 59 | Source: Ambulatory Visit | Attending: Internal Medicine | Admitting: Internal Medicine

## 2020-12-09 ENCOUNTER — Ambulatory Visit (INDEPENDENT_AMBULATORY_CARE_PROVIDER_SITE_OTHER): Payer: 59 | Admitting: Internal Medicine

## 2020-12-09 ENCOUNTER — Encounter: Payer: Self-pay | Admitting: *Deleted

## 2020-12-09 VITALS — BP 148/98 | HR 70 | Ht 62.0 in | Wt 327.4 lb

## 2020-12-09 DIAGNOSIS — I471 Supraventricular tachycardia: Secondary | ICD-10-CM | POA: Diagnosis not present

## 2020-12-09 LAB — CBC
HCT: 36.9 % (ref 36.0–46.0)
Hemoglobin: 11.8 g/dL — ABNORMAL LOW (ref 12.0–15.0)
MCH: 28 pg (ref 26.0–34.0)
MCHC: 32 g/dL (ref 30.0–36.0)
MCV: 87.6 fL (ref 80.0–100.0)
Platelets: 269 10*3/uL (ref 150–400)
RBC: 4.21 MIL/uL (ref 3.87–5.11)
RDW: 14.6 % (ref 11.5–15.5)
WBC: 5 10*3/uL (ref 4.0–10.5)
nRBC: 0 % (ref 0.0–0.2)

## 2020-12-09 LAB — BASIC METABOLIC PANEL
Anion gap: 5 (ref 5–15)
BUN: 14 mg/dL (ref 6–20)
CO2: 26 mmol/L (ref 22–32)
Calcium: 9 mg/dL (ref 8.9–10.3)
Chloride: 107 mmol/L (ref 98–111)
Creatinine, Ser: 0.56 mg/dL (ref 0.44–1.00)
GFR, Estimated: >60 mL/min (ref >60)
Glucose, Bld: 95 mg/dL (ref 70–99)
Potassium: 3.8 mmol/L (ref 3.5–5.1)
Sodium: 138 mmol/L (ref 135–145)

## 2020-12-09 NOTE — H&P (View-Only) (Signed)
HPI Jessica Sanchez returns today for followup of SVT. She is a pleasant 35 yo woman with morbid obesity who underwent EP study and catheter ablation over 2 years ago. At that time she was found to have inducible SVT. He Koch's triangle was quite large and ablation was limited by VA block during junctional rhythm. She developed recurrent symptoms and SVT and has refused repeat ablation up until now. She was intolerant of beta blockers or calcium channel blockers and she has been on 100 mg twice daily of flecainide and continued to have recurrent SVT with 2-3 ER visits in the last 2 months.  Allergies  Allergen Reactions   Shellfish Allergy Shortness Of Breath, Itching and Swelling    Mouth became swollen, throat started itching, and developed shortness of breath   Sulfa Antibiotics Itching, Shortness Of Breath and Swelling    Mouth became swollen, throat started itching, and developed shortness of breath   Adhesive [Tape] Itching    Depends on the type of medical tape     Current Outpatient Medications  Medication Sig Dispense Refill   albuterol (PROVENTIL HFA) 108 (90 Base) MCG/ACT inhaler Inhale 1-2 puffs into the lungs every 6 (six) hours as needed for wheezing or shortness of breath.      flecainide (TAMBOCOR) 100 MG tablet Take 1 tablet (100 mg total) by mouth 2 (two) times daily. 180 tablet 0   metoCLOPramide (REGLAN) 10 MG tablet Take 1 tablet (10 mg total) by mouth every 8 (eight) hours as needed for nausea. 10 tablet 0   Multiple Vitamins-Calcium (ONE-A-DAY WOMENS PO) Take 1 tablet by mouth daily.     acetaminophen (TYLENOL) 325 MG tablet Take 650 mg by mouth every 6 (six) hours as needed for headache. (Patient not taking: Reported on 12/09/2020)     ketorolac (TORADOL) 10 MG tablet Take 1 tablet (10 mg total) by mouth every 8 (eight) hours as needed. (Patient not taking: Reported on 12/09/2020) 15 tablet 0   methocarbamol (ROBAXIN) 500 MG tablet Take 1 tablet (500 mg total) by mouth at  bedtime. (Patient not taking: Reported on 12/09/2020) 60 tablet 1   norethindrone (MICRONOR) 0.35 MG tablet Take 1 tablet (0.35 mg total) by mouth daily. (Patient not taking: Reported on 12/09/2020) 28 tablet 11   ondansetron (ZOFRAN ODT) 8 MG disintegrating tablet Take 1 tablet (8 mg total) by mouth every 8 (eight) hours as needed for nausea or vomiting. (Patient not taking: Reported on 12/09/2020) 8 tablet 0   No current facility-administered medications for this visit.     Past Medical History:  Diagnosis Date   Arthritis    right shoulder   Dyspnea    Dysrhythmia    hx of SVT   Fibroid (bleeding) (uterine)    Fibroid, uterine    H/O metrorrhagia    Hypertension    IUD (intrauterine device) in place 07/11/2015   Menorrhagia    Obesity, Class III, BMI 40-49.9 (morbid obesity) ([place]) 11/11/2018   Sleep apnea    uses CPAP - setting 11   SVT (supraventricular tachycardia) ([place])     ROS:   All systems reviewed and negative except as noted in the HPI.   Past Surgical History:  Procedure Laterality Date   HYSTEROSCOPY N/A 09/17/2020   Procedure: HYSTEROSCOPY;  Surgeon: [name], MD;  Location: [facility];  Service: Gynecology;  Laterality: N/A;   HYSTEROSCOPY WITH D & C N/A 09/27/2013   Procedure: DILATATION AND CURETTAGE /HYSTEROSCOPY and  insertion of Mirena IUD ;  Surgeon: [name]. Harrington Challenger, MD;  Location: [place] [facility];  Service: Gynecology;  Laterality: N/A;   IUD REMOVAL N/A 09/17/2020   Procedure: INTRAUTERINE DEVICE (IUD) REMOVAL (2);  Surgeon: [name], MD;  Location: [facility];  Service: Gynecology;  Laterality: N/A;   MYOMECTOMY  02/03/2011   Procedure: MYOMECTOMY;  Surgeon: [name], MD;  Location: [facility];  Service: Gynecology;  Laterality: N/A;   SVT ABLATION N/A 11/08/2018   Procedure: SVT ABLATION;  Surgeon: [name], MD;  Location: [place] [facility];  Service: Cardiovascular;  Laterality: N/A;   TONSILLECTOMY       Family History  Problem Relation Age of Onset    Cirrhosis Mother    Diabetes Mother    Cancer Maternal Grandfather    Hypertension Sister    Diabetes Sister    Anesthesia problems Neg Hx    Hypotension Neg Hx    Malignant hyperthermia Neg Hx    Pseudochol deficiency Neg Hx      Social History   Socioeconomic History   Marital status: Single    Spouse name: Not on file   Number of children: Not on file   Years of education: Not on file   Highest education level: Not on file  Occupational History   Not on file  Tobacco Use   Smoking status: Light Smoker   Smokeless tobacco: Never   Tobacco comments:    socially  Vaping Use   Vaping Use: Never used  Substance and Sexual Activity   Alcohol use: Yes    Comment: occasional   Drug use: No   Sexual activity: Not Currently    Birth control/protection: I.U.D.  Other Topics Concern   Not on file  Social History Narrative   Not on file   Social Determinants of Health   Financial Resource Strain: Not on file  Food Insecurity: Not on file  Transportation Needs: Not on file  Physical Activity: Not on file  Stress: Not on file  Social Connections: Not on file  Intimate Partner Violence: Not on file     Ht '5\' 2"'$  (1.575 m)   LMP 12/01/2020   BMI 59.26 kg/m   Physical Exam:  Well appearing NAD HEENT: Unremarkable Neck:  No JVD, no thyromegally Lymphatics:  No adenopathy Back:  No CVA tenderness Lungs:  Clear with no wheezes HEART:  Regular rate rhythm, no murmurs, no rubs, no clicks Abd:  soft, obese, positive bowel sounds, no organomegally, no rebound, no guarding Ext:  2 plus pulses, no edema, no cyanosis, no clubbing Skin:  No rashes no nodules Neuro:  CN II through XII intact, motor grossly intact   Assess/Plan:  1. SVT - I have discussed the treatment options and recommended she undergo repeat EP study and ablation. I discussed the risks of needing a PPM. About 1/4 of PPM insertion. We discussed the risks of PPM insertion as well. She understands and  wishes to proceed.  2. Obesity - she needs to lose weight. After ablation we will work on this.  3. HTN - her sbp is elevated. Weight loss would help.  4. Sleep apnea - she is encouraged to get a new cpap unit to treat her severe sleep apnea.   Mikle Bosworth.D.

## 2020-12-09 NOTE — Progress Notes (Signed)
HPI Jessica Sanchez returns today for followup of SVT. She is a pleasant 35 yo woman with morbid obesity who underwent EP study and catheter ablation over 2 years ago. At that time she was found to have inducible SVT. He Koch's triangle was quite large and ablation was limited by VA block during junctional rhythm. She developed recurrent symptoms and SVT and has refused repeat ablation up until now. She was intolerant of beta blockers or calcium channel blockers and she has been on 100 mg twice daily of flecainide and continued to have recurrent SVT with 2-3 ER visits in the last 2 months.  Allergies  Allergen Reactions   Shellfish Allergy Shortness Of Breath, Itching and Swelling    Mouth became swollen, throat started itching, and developed shortness of breath   Sulfa Antibiotics Itching, Shortness Of Breath and Swelling    Mouth became swollen, throat started itching, and developed shortness of breath   Adhesive [Tape] Itching    Depends on the type of medical tape     Current Outpatient Medications  Medication Sig Dispense Refill   albuterol (PROVENTIL HFA) 108 (90 Base) MCG/ACT inhaler Inhale 1-2 puffs into the lungs every 6 (six) hours as needed for wheezing or shortness of breath.      flecainide (TAMBOCOR) 100 MG tablet Take 1 tablet (100 mg total) by mouth 2 (two) times daily. 180 tablet 0   metoCLOPramide (REGLAN) 10 MG tablet Take 1 tablet (10 mg total) by mouth every 8 (eight) hours as needed for nausea. 10 tablet 0   Multiple Vitamins-Calcium (ONE-A-DAY WOMENS PO) Take 1 tablet by mouth daily.     acetaminophen (TYLENOL) 325 MG tablet Take 650 mg by mouth every 6 (six) hours as needed for headache. (Patient not taking: Reported on 12/09/2020)     ketorolac (TORADOL) 10 MG tablet Take 1 tablet (10 mg total) by mouth every 8 (eight) hours as needed. (Patient not taking: Reported on 12/09/2020) 15 tablet 0   methocarbamol (ROBAXIN) 500 MG tablet Take 1 tablet (500 mg total) by mouth at  bedtime. (Patient not taking: Reported on 12/09/2020) 60 tablet 1   norethindrone (MICRONOR) 0.35 MG tablet Take 1 tablet (0.35 mg total) by mouth daily. (Patient not taking: Reported on 12/09/2020) 28 tablet 11   ondansetron (ZOFRAN ODT) 8 MG disintegrating tablet Take 1 tablet (8 mg total) by mouth every 8 (eight) hours as needed for nausea or vomiting. (Patient not taking: Reported on 12/09/2020) 8 tablet 0   No current facility-administered medications for this visit.     Past Medical History:  Diagnosis Date   Arthritis    right shoulder   Dyspnea    Dysrhythmia    hx of SVT   Fibroid (bleeding) (uterine)    Fibroid, uterine    H/O metrorrhagia    Hypertension    IUD (intrauterine device) in place 07/11/2015   Menorrhagia    Obesity, Class III, BMI 40-49.9 (morbid obesity) ([place]) 11/11/2018   Sleep apnea    uses CPAP - setting 11   SVT (supraventricular tachycardia) ([place])     ROS:   All systems reviewed and negative except as noted in the HPI.   Past Surgical History:  Procedure Laterality Date   HYSTEROSCOPY N/A 09/17/2020   Procedure: HYSTEROSCOPY;  Surgeon: [name], MD;  Location: [facility];  Service: Gynecology;  Laterality: N/A;   HYSTEROSCOPY WITH D & C N/A 09/27/2013   Procedure: DILATATION AND CURETTAGE /HYSTEROSCOPY and  insertion of Mirena IUD ;  Surgeon: [name]. Harrington Challenger, MD;  Location: [facility];  Service: Gynecology;  Laterality: N/A;   IUD REMOVAL N/A 09/17/2020   Procedure: INTRAUTERINE DEVICE (IUD) REMOVAL (2);  Surgeon: [name], MD;  Location: [facility];  Service: Gynecology;  Laterality: N/A;   MYOMECTOMY  02/03/2011   Procedure: MYOMECTOMY;  Surgeon: [name], MD;  Location: [facility];  Service: Gynecology;  Laterality: N/A;   SVT ABLATION N/A 11/08/2018   Procedure: SVT ABLATION;  Surgeon: [name], MD;  Location: [facility];  Service: Cardiovascular;  Laterality: N/A;   TONSILLECTOMY       Family History  Problem Relation Age of Onset    Cirrhosis Mother    Diabetes Mother    Cancer Maternal Grandfather    Hypertension Sister    Diabetes Sister    Anesthesia problems Neg Hx    Hypotension Neg Hx    Malignant hyperthermia Neg Hx    Pseudochol deficiency Neg Hx      Social History   Socioeconomic History   Marital status: Single    Spouse name: Not on file   Number of children: Not on file   Years of education: Not on file   Highest education level: Not on file  Occupational History   Not on file  Tobacco Use   Smoking status: Light Smoker   Smokeless tobacco: Never   Tobacco comments:    socially  Vaping Use   Vaping Use: Never used  Substance and Sexual Activity   Alcohol use: Yes    Comment: occasional   Drug use: No   Sexual activity: Not Currently    Birth control/protection: I.U.D.  Other Topics Concern   Not on file  Social History Narrative   Not on file   Social Determinants of Health   Financial Resource Strain: Not on file  Food Insecurity: Not on file  Transportation Needs: Not on file  Physical Activity: Not on file  Stress: Not on file  Social Connections: Not on file  Intimate Partner Violence: Not on file     Ht '5\' 2"'$  (1.575 m)   LMP 12/01/2020   BMI 59.26 kg/m   Physical Exam:  Well appearing NAD HEENT: Unremarkable Neck:  No JVD, no thyromegally Lymphatics:  No adenopathy Back:  No CVA tenderness Lungs:  Clear with no wheezes HEART:  Regular rate rhythm, no murmurs, no rubs, no clicks Abd:  soft, obese, positive bowel sounds, no organomegally, no rebound, no guarding Ext:  2 plus pulses, no edema, no cyanosis, no clubbing Skin:  No rashes no nodules Neuro:  CN II through XII intact, motor grossly intact   Assess/Plan:  1. SVT - I have discussed the treatment options and recommended she undergo repeat EP study and ablation. I discussed the risks of needing a PPM. About 1/4 of PPM insertion. We discussed the risks of PPM insertion as well. She understands and  wishes to proceed.  2. Obesity - she needs to lose weight. After ablation we will work on this.  3. HTN - her sbp is elevated. Weight loss would help.  4. Sleep apnea - she is encouraged to get a new cpap unit to treat her severe sleep apnea.   Mikle Bosworth.D.

## 2020-12-09 NOTE — Patient Instructions (Signed)
Medication Instructions:  Your physician recommends that you continue on your current medications as directed. Please refer to the Current Medication list given to you today.  *If you need a refill on your cardiac medications before your next appointment, please call your pharmacy*   Lab Work: Your physician recommends that you return for lab work in: Today   If you have labs (blood work) drawn today and your tests are completely normal, you will receive your results only by: MyChart Message (if you have MyChart) OR A paper copy in the mail If you have any lab test that is abnormal or we need to change your treatment, we will call you to review the results.   Testing/Procedures: Your physician has recommended that you have an ablation. Catheter ablation is a medical procedure used to treat some cardiac arrhythmias (irregular heartbeats). During catheter ablation, a long, thin, flexible tube is put into a blood vessel in your groin (upper thigh), or neck. This tube is called an ablation catheter. It is then guided to your heart through the blood vessel. Radio frequency waves destroy small areas of heart tissue where abnormal heartbeats may cause an arrhythmia to start. Please see the instruction sheet given to you today.    Follow-Up: At [facility], you and your health needs are our priority.  As part of our continuing mission to provide you with exceptional heart care, we have created designated Provider Care Teams.  These Care Teams include your primary Cardiologist (physician) and Advanced Practice Providers (APPs -  Physician Assistants and Nurse Practitioners) who all work together to provide you with the care you need, when you need it.  We recommend signing up for the patient portal called "MyChart".  Sign up information is provided on this After Visit Summary.  MyChart is used to connect with patients for Virtual Visits (Telemedicine).  Patients are able to view lab/test results,  encounter notes, upcoming appointments, etc.  Non-urgent messages can be sent to your provider as well.   To learn more about what you can do with MyChart, go to [email or web address].    Your next appointment:    SVT Ablation   The format for your next appointment:   In Person  Provider:   [name], MD   Other Instructions Thank you for choosing [facility]!  Cardiac electrophysiology: From cell to bedside (7th ed., pp. [phone]). [place], [place]: Elsevier.">  Cardiac Ablation Cardiac ablation is a procedure to destroy, or ablate, a small amount of heart tissue in very specific places. The heart has many electrical connections. Sometimes these connections are abnormal and can cause the heart to beat very fast or irregularly. Ablating some of the areas that cause problems can improve the heart's rhythm or return it to normal. Ablation may be done for people who: Have Wolff-Parkinson-White syndrome. Have fast heart rhythms (tachycardia). Have taken medicines for an abnormal heart rhythm (arrhythmia) that were not effective or caused side effects. Have a high-risk heartbeat that may be life-threatening. During the procedure, a small incision is made in the neck or the groin, and a long, thin tube (catheter) is inserted into the incision and moved to the heart. Small devices (electrodes) on the tip of the catheter will send out electrical currents. A type of X-ray (fluoroscopy) will be used to help guide the catheter and to provide images of the heart. Tell a health care provider about: Any allergies you have. All medicines you are taking, including vitamins, herbs, eye drops, creams,  and over-the-counter medicines. Any problems you or family members have had with anesthetic medicines. Any blood disorders you have. Any surgeries you have had. Any medical conditions you have, such as kidney failure. Whether you are pregnant or may be pregnant. What are the  risks? Generally, this is a safe procedure. However, problems may occur, including: Infection. Bruising and bleeding at the catheter insertion site. Bleeding into the chest, especially into the sac that surrounds the heart. This is a serious complication. Stroke or blood clots. Damage to nearby structures or organs. Allergic reaction to medicines or dyes. Need for a permanent pacemaker if the normal electrical system is damaged. A pacemaker is a small computer that sends electrical signals to the heart and helps your heart beat normally. The procedure not being fully effective. This may not be recognized until months later. Repeat ablation procedures are sometimes done. What happens before the procedure? Medicines Ask your health care provider about: Changing or stopping your regular medicines. This is especially important if you are taking diabetes medicines or blood thinners. Taking medicines such as aspirin and ibuprofen. These medicines can thin your blood. Do not take these medicines unless your health care provider tells you to take them. Taking over-the-counter medicines, vitamins, herbs, and supplements. General instructions Follow instructions from your health care provider about eating or drinking restrictions. Plan to have someone take you home from the hospital or clinic. If you will be going home right after the procedure, plan to have someone with you for 24 hours. Ask your health care provider what steps will be taken to prevent infection. What happens during the procedure?  An IV will be inserted into one of your veins. You will be given a medicine to help you relax (sedative). The skin on your neck or groin will be numbed. An incision will be made in your neck or your groin. A needle will be inserted through the incision and into a large vein in your neck or groin. A catheter will be inserted into the needle and moved to your heart. Dye may be injected through the  catheter to help your surgeon see the area of the heart that needs treatment. Electrical currents will be sent from the catheter to ablate heart tissue in desired areas. There are three types of energy that may be used to do this: Heat (radiofrequency energy). Laser energy. Extreme cold (cryoablation). When the tissue has been ablated, the catheter will be removed. Pressure will be held on the insertion area to prevent a lot of bleeding. A bandage (dressing) will be placed over the insertion area. The exact procedure may vary among health care providers and hospitals. What happens after the procedure? Your blood pressure, heart rate, breathing rate, and blood oxygen level will be monitored until you leave the hospital or clinic. Your insertion area will be monitored for bleeding. You will need to lie still for a few hours to ensure that you do not bleed from the insertion area. Do not drive for 24 hours or as long as told by your health care provider. Summary Cardiac ablation is a procedure to destroy, or ablate, a small amount of heart tissue using an electrical current. This procedure can improve the heart rhythm or return it to normal. Tell your health care provider about any medical conditions you may have and all medicines you are taking to treat them. This is a safe procedure, but problems may occur. Problems may include infection, bruising, damage to nearby organs or structures,  or allergic reactions to medicines. Follow your health care provider's instructions about eating and drinking before the procedure. You may also be told to change or stop some of your medicines. After the procedure, do not drive for 24 hours or as long as told by your health care provider. This information is not intended to replace advice given to you by your health care provider. Make sure you discuss any questions you have with your healthcare provider. Document Revised: 02/26/2019 Document Reviewed:  02/26/2019 Elsevier Patient Education  [place].

## 2020-12-10 ENCOUNTER — Telehealth: Payer: Self-pay | Admitting: Internal Medicine

## 2020-12-10 NOTE — Telephone Encounter (Signed)
Procedure is December 26, 2020  Pt will require 1 week off work.  May return after 1 week with  no restrictions.

## 2020-12-10 NOTE — Telephone Encounter (Signed)
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

## 2020-12-10 NOTE — Telephone Encounter (Signed)
Spoke to pt who verbalized understanding. Pt required letter which was printed out and placed at the front office awaiting pick up. Pt had no further concerns or questions.

## 2020-12-25 NOTE — Pre-Procedure Instructions (Signed)
Instructed patient on the following items: °Arrival time 0530 °Nothing to eat or drink after midnight °No meds AM of procedure °Responsible person to drive you home and stay with you for 24 hrs ° ° °   °

## 2020-12-26 ENCOUNTER — Ambulatory Visit (HOSPITAL_COMMUNITY): Payer: 59 | Admitting: Anesthesiology

## 2020-12-26 ENCOUNTER — Encounter (HOSPITAL_COMMUNITY)
Admission: RE | Disposition: A | Discharge status: Home / Self Care | Payer: Self-pay | Source: Home / Self Care | Attending: Internal Medicine

## 2020-12-26 ENCOUNTER — Other Ambulatory Visit: Payer: Self-pay

## 2020-12-26 ENCOUNTER — Ambulatory Visit (HOSPITAL_COMMUNITY)
Admission: RE | Admit: 2020-12-26 | Discharge: 2020-12-26 | Disposition: A | Discharge status: Home / Self Care | Payer: 59 | Attending: Internal Medicine | Admitting: Internal Medicine

## 2020-12-26 DIAGNOSIS — Z888 Allergy status to other drugs, medicaments and biological substances status: Secondary | ICD-10-CM | POA: Diagnosis not present

## 2020-12-26 DIAGNOSIS — I471 Supraventricular tachycardia, unspecified: Secondary | ICD-10-CM | POA: Diagnosis present

## 2020-12-26 DIAGNOSIS — F17200 Nicotine dependence, unspecified, uncomplicated: Secondary | ICD-10-CM | POA: Diagnosis not present

## 2020-12-26 DIAGNOSIS — Z882 Allergy status to sulfonamides status: Secondary | ICD-10-CM | POA: Diagnosis not present

## 2020-12-26 DIAGNOSIS — R69 Illness, unspecified: Secondary | ICD-10-CM | POA: Diagnosis not present

## 2020-12-26 DIAGNOSIS — G4730 Sleep apnea, unspecified: Secondary | ICD-10-CM | POA: Diagnosis not present

## 2020-12-26 DIAGNOSIS — Z91013 Allergy to seafood: Secondary | ICD-10-CM | POA: Insufficient documentation

## 2020-12-26 DIAGNOSIS — Z6841 Body Mass Index (BMI) 40.0 and over, adult: Secondary | ICD-10-CM | POA: Insufficient documentation

## 2020-12-26 DIAGNOSIS — I10 Essential (primary) hypertension: Secondary | ICD-10-CM | POA: Diagnosis not present

## 2020-12-26 DIAGNOSIS — Z79899 Other long term (current) drug therapy: Secondary | ICD-10-CM | POA: Insufficient documentation

## 2020-12-26 DIAGNOSIS — Z9989 Dependence on other enabling machines and devices: Secondary | ICD-10-CM | POA: Diagnosis not present

## 2020-12-26 HISTORY — PX: SVT ABLATION: EP1225

## 2020-12-26 LAB — POCT ACTIVATED CLOTTING TIME
Activated Clotting Time: 184 seconds
Activated Clotting Time: 173 seconds

## 2020-12-26 LAB — PREGNANCY, URINE: Preg Test, Ur: NEGATIVE

## 2020-12-26 SURGERY — SVT ABLATION
Anesthesia: Monitor Anesthesia Care

## 2020-12-26 MED ORDER — GLYCOPYRROLATE PF 0.2 MG/ML IJ SOSY
PREFILLED_SYRINGE | INTRAMUSCULAR | Status: DC | PRN
  Administered 2020-12-26 (×2): .1 mg via INTRAVENOUS

## 2020-12-26 MED ORDER — HEPARIN SODIUM (PORCINE) 1000 UNIT/ML IJ SOLN
INTRAMUSCULAR | Status: DC | PRN
  Administered 2020-12-26: 8000 [IU] via INTRAVENOUS

## 2020-12-26 MED ORDER — BUPIVACAINE HCL (PF) 0.25 % IJ SOLN
INTRAMUSCULAR | Status: DC | PRN
  Administered 2020-12-26: 60 mL

## 2020-12-26 MED ORDER — BUPIVACAINE HCL (PF) 0.25 % IJ SOLN
INTRAMUSCULAR | Status: AC
  Filled 2020-12-26: qty 30

## 2020-12-26 MED ORDER — SUGAMMADEX SODIUM 200 MG/2ML IV SOLN
INTRAVENOUS | Status: DC | PRN
  Administered 2020-12-26: 400 mg via INTRAVENOUS

## 2020-12-26 MED ORDER — ACETAMINOPHEN 325 MG PO TABS
650.0000 mg | ORAL_TABLET | ORAL | Status: DC | PRN
  Filled 2020-12-26: qty 2

## 2020-12-26 MED ORDER — HEPARIN (PORCINE) IN NACL 1000-0.9 UT/500ML-% IV SOLN
INTRAVENOUS | Status: DC | PRN
  Administered 2020-12-26: 500 mL

## 2020-12-26 MED ORDER — SUCCINYLCHOLINE CHLORIDE 200 MG/10ML IV SOSY
PREFILLED_SYRINGE | INTRAVENOUS | Status: DC | PRN
  Administered 2020-12-26: 160 mg via INTRAVENOUS

## 2020-12-26 MED ORDER — HEPARIN (PORCINE) IN NACL 1000-0.9 UT/500ML-% IV SOLN
INTRAVENOUS | Status: DC | PRN
Start: 2020-12-26 — End: 2020-12-26
  Administered 2020-12-26: 500 mL

## 2020-12-26 MED ORDER — SODIUM CHLORIDE 0.9 % IV SOLN: 80.0000 mg | INTRAVENOUS | Status: DC

## 2020-12-26 MED ORDER — CEFAZOLIN IN SODIUM CHLORIDE 3-0.9 GM/100ML-% IV SOLN: 3.0000 g | INTRAVENOUS | Status: DC

## 2020-12-26 MED ORDER — PROPOFOL 10 MG/ML IV BOLUS
INTRAVENOUS | Status: DC | PRN
  Administered 2020-12-26 (×2): 50 mg via INTRAVENOUS
  Administered 2020-12-26: 20 mg via INTRAVENOUS

## 2020-12-26 MED ORDER — SODIUM CHLORIDE 0.9% FLUSH: 3.0000 mL | INTRAVENOUS | Status: DC | PRN

## 2020-12-26 MED ORDER — MIDAZOLAM HCL 5 MG/5ML IJ SOLN
INTRAMUSCULAR | Status: DC | PRN
  Administered 2020-12-26: 2 mg via INTRAVENOUS

## 2020-12-26 MED ORDER — HEPARIN SODIUM (PORCINE) 1000 UNIT/ML IJ SOLN
INTRAMUSCULAR | Status: DC | PRN
  Administered 2020-12-26: 1000 [IU] via INTRAVENOUS

## 2020-12-26 MED ORDER — SODIUM CHLORIDE 0.9 % IV SOLN: INTRAVENOUS | Status: DC

## 2020-12-26 MED ORDER — HEPARIN (PORCINE) IN NACL 1000-0.9 UT/500ML-% IV SOLN
INTRAVENOUS | Status: AC
  Filled 2020-12-26: qty 500

## 2020-12-26 MED ORDER — PHENYLEPHRINE HCL-NACL 20-0.9 MG/250ML-% IV SOLN
INTRAVENOUS | Status: DC | PRN
  Administered 2020-12-26: 50 ug/min via INTRAVENOUS

## 2020-12-26 MED ORDER — ROCURONIUM BROMIDE 10 MG/ML (PF) SYRINGE
PREFILLED_SYRINGE | INTRAVENOUS | Status: DC | PRN
  Administered 2020-12-26: 20 mg via INTRAVENOUS
  Administered 2020-12-26: 60 mg via INTRAVENOUS

## 2020-12-26 MED ORDER — POVIDONE-IODINE 10 % EX SWAB: 2.0000 "application " | Freq: Once | CUTANEOUS | Status: DC

## 2020-12-26 MED ORDER — HEPARIN SODIUM (PORCINE) 1000 UNIT/ML IJ SOLN
INTRAMUSCULAR | Status: AC
  Filled 2020-12-26: qty 1

## 2020-12-26 MED ORDER — ONDANSETRON HCL 4 MG/2ML IJ SOLN: 4.0000 mg | Freq: Four times a day (QID) | INTRAMUSCULAR | Status: DC | PRN

## 2020-12-26 MED ORDER — PROPOFOL 500 MG/50ML IV EMUL
INTRAVENOUS | Status: DC | PRN
  Administered 2020-12-26: 75 ug/kg/min via INTRAVENOUS

## 2020-12-26 MED ORDER — LIDOCAINE HCL 1 % IJ SOLN
INTRAMUSCULAR | Status: AC
  Filled 2020-12-26: qty 20

## 2020-12-26 MED ORDER — CHLORHEXIDINE GLUCONATE 4 % EX LIQD: 4.0000 "application " | Freq: Once | CUTANEOUS | Status: DC

## 2020-12-26 MED ORDER — SODIUM CHLORIDE 0.9 % IV SOLN: 250.0000 mL | INTRAVENOUS | Status: DC | PRN

## 2020-12-26 SURGICAL SUPPLY — 11 items
CATH JOSEPH QUAD ALLRED 6F REP (CATHETERS) ×4 IMPLANT
CATH SMTCH THERMOCOOL SF DF (CATHETERS) ×2 IMPLANT
CATH WEBSTER BI DIR CS D-F CRV (CATHETERS) ×2 IMPLANT
PACK EP LATEX FREE (CUSTOM PROCEDURE TRAY) ×2
PACK EP LF (CUSTOM PROCEDURE TRAY) ×1 IMPLANT
PAD PRO RADIOLUCENT 2001M-C (PAD) ×2 IMPLANT
PATCH CARTO3 (PAD) ×2 IMPLANT
SHEATH PINNACLE 6F 10CM (SHEATH) ×2 IMPLANT
SHEATH PINNACLE 7F 10CM (SHEATH) ×2 IMPLANT
SHEATH PINNACLE 8F 10CM (SHEATH) ×4 IMPLANT
TUBING SMART ABLATE COOLFLOW (TUBING) ×2 IMPLANT

## 2020-12-26 NOTE — Discharge Instructions (Addendum)
Post procedure care instructions No driving for 4 days. No lifting over 5 lbs for 1 week. No vigorous or sexual activity for 1 week. You may return to work/your usual activities on 01/03/21. Keep procedure site clean & dry. If you notice increased pain, swelling, bleeding or pus, call/return!  You may shower after 24 hours, but no soaking in baths/hot tubs/pools for 1 week.  Cardiac Ablation, Care After  This sheet gives you information about how to care for yourself after your procedure. Your health care provider may also give you more specific instructions. If you have problems or questions, contact your health care provider. What can I expect after the procedure? After the procedure, it is common to have: Bruising around your puncture site. Tenderness around your puncture site. Skipped heartbeats. Tiredness (fatigue).  Follow these instructions at home: Puncture site care  Follow instructions from your health care provider about how to take care of your puncture site. Make sure you: If present, leave stitches (sutures), skin glue, or adhesive strips in place. These skin closures may need to stay in place for up to 2 weeks. If adhesive strip edges start to loosen and curl up, you may trim the loose edges. Do not remove adhesive strips completely unless your health care provider tells you to do that. If a large square bandage is present, this may be removed 24 hours after surgery.  Check your puncture site every day for signs of infection. Check for: Redness, swelling, or pain. Fluid or blood. If your puncture site starts to bleed, lie down on your back, apply firm pressure to the area, and contact your health care provider. Warmth. Pus or a bad smell. Driving Do not drive for at least 4 days after your procedure or however long your health care provider recommends. (Do not resume driving if you have previously been instructed not to drive for other health reasons.) Do not drive or use heavy  machinery while taking prescription pain medicine. Activity Avoid activities that take a lot of effort for at least 7 days after your procedure. Do not lift anything that is heavier than 5 lb (4.5 kg) for one week.  No sexual activity for 1 week.  Return to your normal activities as told by your health care provider. Ask your health care provider what activities are safe for you. General instructions Take over-the-counter and prescription medicines only as told by your health care provider. Do not use any products that contain nicotine or tobacco, such as cigarettes and e-cigarettes. If you need help quitting, ask your health care provider. You may shower after 24 hours, but Do not take baths, swim, or use a hot tub for 1 week.  Do not drink alcohol for 24 hours after your procedure. Keep all follow-up visits as told by your health care provider. This is important. Contact a health care provider if: You have redness, mild swelling, or pain around your puncture site. You have fluid or blood coming from your puncture site that stops after applying firm pressure to the area. Your puncture site feels warm to the touch. You have pus or a bad smell coming from your puncture site. You have a fever. You have chest pain or discomfort that spreads to your neck, jaw, or arm. You are sweating a lot. You feel nauseous. You have a fast or irregular heartbeat. You have shortness of breath. You are dizzy or light-headed and feel the need to lie down. You have pain or numbness in the  arm or leg closest to your puncture site. Get help right away if: Your puncture site suddenly swells. Your puncture site is bleeding and the bleeding does not stop after applying firm pressure to the area. These symptoms may represent a serious problem that is an emergency. Do not wait to see if the symptoms will go away. Get medical help right away. Call your local emergency services (911 in the [place] Do not drive yourself  to the hospital. Summary After the procedure, it is normal to have bruising and tenderness at the puncture site in your groin, neck, or forearm. Check your puncture site every day for signs of infection. Get help right away if your puncture site is bleeding and the bleeding does not stop after applying firm pressure to the area. This is a medical emergency. This information is not intended to replace advice given to you by your health care provider. Make sure you discuss any questions you have with your health care provider.

## 2020-12-26 NOTE — Interval H&P Note (Signed)
History and Physical Interval Note:  12/26/2020 7:53 AM  Jessica Sanchez  has presented today for surgery, with the diagnosis of svt.  The various methods of treatment have been discussed with the patient and family. After consideration of risks, benefits and other options for treatment, the patient has consented to  Procedure(s): SVT ABLATION (N/A) as a surgical intervention.  The patient's history has been reviewed, patient examined, no change in status, stable for surgery.  I have reviewed the patient's chart and labs.  Questions were answered to the patient's satisfaction.   I have carefully reviewed with the patient the possibililty of her procedure being complicated by the development of CHB and the need for PPM insertion. We will try to avoid this. However, she has required over 7 ER visits in the past 38month for recurrent SVT and is severely debilitated from this.   GCristopher Peru MD

## 2020-12-26 NOTE — Anesthesia Preprocedure Evaluation (Addendum)
Anesthesia Evaluation  Patient identified by MRN, date of birth, ID band Patient awake    Reviewed: Allergy & Precautions, NPO status , Patient's Chart, lab work & pertinent test results  History of Anesthesia Complications Negative for: history of anesthetic complications  Airway Mallampati: IV  TM Distance: >3 FB Neck ROM: Full    Dental  (+) Teeth Intact, Dental Advisory Given   Pulmonary shortness of breath, sleep apnea and Continuous Positive Airway Pressure Ventilation , Current Smoker and Patient abstained from smoking.,    breath sounds clear to auscultation       Cardiovascular hypertension, + dysrhythmias Supra Ventricular Tachycardia  Rhythm:Regular     Neuro/Psych negative neurological ROS  negative psych ROS   GI/Hepatic Neg liver ROS, GERD  Controlled,  Endo/Other  Morbid obesity  Renal/GU negative Renal ROS     Musculoskeletal  (+) Arthritis ,   Abdominal   Peds  Hematology  (+) Blood dyscrasia, anemia , Lab Results      Component                Value               Date                      WBC                      5.0                 12/09/2020                HGB                      11.8 (L)            12/09/2020                HCT                      36.9                12/09/2020                MCV                      87.6                12/09/2020                PLT                      269                 12/09/2020              Anesthesia Other Findings   Reproductive/Obstetrics Lab Results      Component                Value               Date                      PREGTESTUR               NEGATIVE            12/26/2020  [place]                NEGATIVE            10/21/2018                HCG                      <5.0                06/26/2020                HCGQUANT                 <1                  09/19/2018                                         Anesthesia Physical Anesthesia Plan  ASA: 4  Anesthesia Plan: MAC   Post-op Pain Management:    Induction: Intravenous  PONV Risk Score and Plan: 1 and Propofol infusion and Treatment may vary due to age or medical condition  Airway Management Planned: Nasal Cannula  Additional Equipment: None  Intra-op Plan:   Post-operative Plan:   Informed Consent: I have reviewed the patients History and Physical, chart, labs and discussed the procedure including the risks, benefits and alternatives for the proposed anesthesia with the patient or authorized representative who has indicated his/her understanding and acceptance.     Dental advisory given  Plan Discussed with: CRNA, Anesthesiologist and Surgeon  Anesthesia Plan Comments: (Will start with IV sedation, CPAP is tolerated, Transition to LMA vs GETA if unable to tolerate sedation given airway and OSA)       Anesthesia Quick Evaluation

## 2020-12-26 NOTE — Anesthesia Procedure Notes (Signed)
Procedure Name: Intubation Date/Time: 12/26/2020 8:42 AM Performed by: Renato Shin, CRNA Pre-anesthesia Checklist: Patient identified, Emergency Drugs available, Suction available and Patient being monitored Patient Re-evaluated:Patient Re-evaluated prior to induction Oxygen Delivery Method: Circle system utilized Preoxygenation: Pre-oxygenation with 100% oxygen Induction Type: IV induction Ventilation: Mask ventilation with difficulty and Two handed mask ventilation required Laryngoscope Size: Glidescope and 3 Grade View: Grade I Tube type: Oral Tube size: 7.0 mm Number of attempts: 1 Airway Equipment and Method: Stylet and Oral airway Placement Confirmation: ETT inserted through vocal cords under direct vision, positive ETCO2 and breath sounds checked- equal and bilateral Secured at: 21 cm Tube secured with: Tape Dental Injury: Teeth and Oropharynx as per pre-operative assessment

## 2020-12-26 NOTE — Progress Notes (Signed)
Site area: rt groin arterial and 3  fv sheaths pulled Site Prior to Removal:  Level 0 Pressure Applied For: 30 minutes Manual:   yes Patient Status During Pull:  stable Post Pull Site:  Level 0 Post Pull Instructions Given:  yes Post Pull Pulses Present: rt dp palpable Dressing Applied:  gauze and tegaderm Bedrest begins @ 1055 Comments:

## 2020-12-26 NOTE — Transfer of Care (Signed)
Immediate Anesthesia Transfer of Care Note  Patient: Jessica Sanchez  Procedure(s) Performed: SVT ABLATION  Patient Location: PACU  Anesthesia Type:General  Level of Consciousness: awake and patient cooperative  Airway & Oxygen Therapy: Patient Spontanous Breathing and Patient connected to face mask oxygen  Post-op Assessment: Report given to RN and Post -op Vital signs reviewed and stable  Post vital signs: Reviewed and stable  Last Vitals:  Vitals Value Taken Time  BP 133/68 12/26/20 1017  Temp 36.8 C 12/26/20 1009  Pulse 96 12/26/20 1020  Resp 19 12/26/20 1020  SpO2 100 % 12/26/20 1020  Vitals shown include unvalidated device data.  Last Pain:  Vitals:   12/26/20 1009  TempSrc: Temporal  PainSc:          Complications:  Encounter Notable Events  Notable Event Outcome Phase Comment  None  Intraprocedure   None  Intraprocedure

## 2020-12-26 NOTE — Progress Notes (Signed)
Pt ambulated without difficulty or bleeding.   Discharged home with aunt who will drive and stay with pt x 24 hrs.

## 2020-12-28 ENCOUNTER — Encounter (HOSPITAL_COMMUNITY): Payer: Self-pay | Admitting: Internal Medicine

## 2020-12-29 ENCOUNTER — Emergency Department (HOSPITAL_COMMUNITY)
Admission: EM | Admit: 2020-12-29 | Discharge: 2020-12-30 | Disposition: A | Discharge status: Home / Self Care | Payer: 59 | Attending: Emergency Medicine | Admitting: Emergency Medicine

## 2020-12-29 ENCOUNTER — Telehealth: Payer: Self-pay

## 2020-12-29 ENCOUNTER — Other Ambulatory Visit: Payer: Self-pay

## 2020-12-29 ENCOUNTER — Encounter (HOSPITAL_COMMUNITY): Payer: Self-pay | Admitting: *Deleted

## 2020-12-29 DIAGNOSIS — R002 Palpitations: Secondary | ICD-10-CM | POA: Diagnosis not present

## 2020-12-29 DIAGNOSIS — I10 Essential (primary) hypertension: Secondary | ICD-10-CM | POA: Insufficient documentation

## 2020-12-29 DIAGNOSIS — R0602 Shortness of breath: Secondary | ICD-10-CM | POA: Diagnosis not present

## 2020-12-29 DIAGNOSIS — Z8679 Personal history of other diseases of the circulatory system: Secondary | ICD-10-CM

## 2020-12-29 DIAGNOSIS — R079 Chest pain, unspecified: Secondary | ICD-10-CM | POA: Diagnosis not present

## 2020-12-29 DIAGNOSIS — Z7901 Long term (current) use of anticoagulants: Secondary | ICD-10-CM | POA: Insufficient documentation

## 2020-12-29 DIAGNOSIS — F17200 Nicotine dependence, unspecified, uncomplicated: Secondary | ICD-10-CM | POA: Insufficient documentation

## 2020-12-29 DIAGNOSIS — R778 Other specified abnormalities of plasma proteins: Secondary | ICD-10-CM

## 2020-12-29 DIAGNOSIS — R69 Illness, unspecified: Secondary | ICD-10-CM | POA: Diagnosis not present

## 2020-12-29 LAB — CBC
HCT: 42.2 % (ref 36.0–46.0)
Hemoglobin: 13.1 g/dL (ref 12.0–15.0)
MCH: 27.2 pg (ref 26.0–34.0)
MCHC: 31 g/dL (ref 30.0–36.0)
MCV: 87.7 fL (ref 80.0–100.0)
Platelets: 239 10*3/uL (ref 150–400)
RBC: 4.81 MIL/uL (ref 3.87–5.11)
RDW: 14.5 % (ref 11.5–15.5)
WBC: 6.3 10*3/uL (ref 4.0–10.5)
nRBC: 0 % (ref 0.0–0.2)

## 2020-12-29 LAB — BASIC METABOLIC PANEL
Anion gap: 10 (ref 5–15)
BUN: 12 mg/dL (ref 6–20)
CO2: 23 mmol/L (ref 22–32)
Calcium: 9 mg/dL (ref 8.9–10.3)
Chloride: 102 mmol/L (ref 98–111)
Creatinine, Ser: 0.63 mg/dL (ref 0.44–1.00)
GFR, Estimated: >60 mL/min (ref >60)
Glucose, Bld: 72 mg/dL (ref 70–99)
Potassium: 4.2 mmol/L (ref 3.5–5.1)
Sodium: 135 mmol/L (ref 135–145)

## 2020-12-29 LAB — TROPONIN I (HIGH SENSITIVITY): Troponin I (High Sensitivity): 86 ng/L — ABNORMAL HIGH (ref <18)

## 2020-12-29 NOTE — Telephone Encounter (Signed)
Patient called to say she awakened at 0530 this am to feel SOB , chest tightness and rapid HR. By her home pulse ox her HR was 160 bpm. She had an SVT ablation 3 days ago (12/26/20) She calls [place] at 1400 hrs to say her HR is 140. I advised her to go to [facility] ED for evaluation but she states she cannot go to [facility].I advised her to go to [facility] ED and she agrees.    I will FYI Dr.Taylor.

## 2020-12-29 NOTE — ED Triage Notes (Signed)
Pt had recent cardiac ablation done on Friday, woke up early this morning with her heart racing.  +mid CP and SOB-no distress noted in triage.

## 2020-12-29 NOTE — ED Provider Notes (Signed)
[facility] EMERGENCY DEPARTMENT Provider Note   CSN: [id number] Arrival date & time: 12/29/20  1501     History Chief Complaint  Patient presents with   Palpitations    Jessica Sanchez is a 35 y.o. female.  Patient c/o sense of palpitations at rest this AM. Symptoms acute onset, moderate, chest felt tight, non radiating. Says hr 160. Denies associated nv, or diaphoresis. No syncope. No cough or uri symptoms. No fever or chills. No leg pain or swelling. Recent ablation procedure for SVT. States no recent change in meds, compliant w meds.   The history is provided by the patient and medical records.  Palpitations Associated symptoms: no back pain, no cough, no nausea and no vomiting       Past Medical History:  Diagnosis Date   Arthritis    right shoulder   Dyspnea    Dysrhythmia    hx of SVT   Fibroid (bleeding) (uterine)    Fibroid, uterine    H/O metrorrhagia    Hypertension    IUD (intrauterine device) in place 07/11/2015   Menorrhagia    Obesity, Class III, BMI 40-49.9 (morbid obesity) ([place]) 11/11/2018   Sleep apnea    uses CPAP - setting 11   SVT (supraventricular tachycardia) ([place])     Patient Active Problem List   Diagnosis Date Noted   Encounter for IUD removal    Menorrhagia 09/04/2020   Elevated troponin    Attempted IUD removal, unsuccessful 07/05/2019   Encounter for IUD insertion 07/05/2019   Gastroesophageal reflux disease    Nonspecific chest pain 11/11/2018   Morbid obesity with body mass index (BMI) of 60.0 to 69.9 in adult ([place]) 11/11/2018   Elevated d-dimer 11/11/2018   SVT (supraventricular tachycardia) ([place]) 10/04/2018   IUD (intrauterine device) in place 07/11/2015   Other disorder of menstruation and other abnormal bleeding from female genital tract 10/30/2012    Past Surgical History:  Procedure Laterality Date   HYSTEROSCOPY N/A 09/17/2020   Procedure: HYSTEROSCOPY;  Surgeon: [name], MD;  Location: [facility];  Service: Gynecology;   Laterality: N/A;   HYSTEROSCOPY WITH D & C N/A 09/27/2013   Procedure: DILATATION AND CURETTAGE /HYSTEROSCOPY and insertion of Mirena IUD ;  Surgeon: [name]. Harrington Challenger, MD;  Location: [facility];  Service: Gynecology;  Laterality: N/A;   IUD REMOVAL N/A 09/17/2020   Procedure: INTRAUTERINE DEVICE (IUD) REMOVAL (2);  Surgeon: [name], MD;  Location: [facility];  Service: Gynecology;  Laterality: N/A;   MYOMECTOMY  02/03/2011   Procedure: MYOMECTOMY;  Surgeon: [name], MD;  Location: [facility];  Service: Gynecology;  Laterality: N/A;   SVT ABLATION N/A 11/08/2018   Procedure: SVT ABLATION;  Surgeon: [name], MD;  Location: [place];  Service: Cardiovascular;  Laterality: N/A;   SVT ABLATION N/A 12/26/2020   Procedure: SVT ABLATION;  Surgeon: [name], MD;  Location: [place] [facility];  Service: Cardiovascular;  Laterality: N/A;   TONSILLECTOMY       OB History     Gravida  1   Para      Term      Preterm      AB  1   Living         SAB  1   IAB      Ectopic      Multiple      Live Births  Family History  Problem Relation Age of Onset   Cirrhosis Mother    Diabetes Mother    Cancer Maternal Grandfather    Hypertension Sister    Diabetes Sister    Anesthesia problems Neg Hx    Hypotension Neg Hx    Malignant hyperthermia Neg Hx    Pseudochol deficiency Neg Hx     Social History   Tobacco Use   Smoking status: Light Smoker   Smokeless tobacco: Never   Tobacco comments:    socially  Vaping Use   Vaping Use: Never used  Substance Use Topics   Alcohol use: Yes    Comment: occasional   Drug use: No    Home Medications Prior to Admission medications   Medication Sig Start Date End Date Taking? Authorizing Provider  acetaminophen (TYLENOL) 325 MG tablet Take 650 mg by mouth every 6 (six) hours as needed for headache.    [provider]  albuterol (PROVENTIL HFA) 108 (90 Base) MCG/ACT inhaler Inhale 1-2 puffs  into the lungs every 6 (six) hours as needed for wheezing or shortness of breath.     [provider]  flecainide (TAMBOCOR) 100 MG tablet Take 1 tablet (100 mg total) by mouth 2 (two) times daily. 11/13/20   [name], MD  ketorolac (TORADOL) 10 MG tablet Take 1 tablet (10 mg total) by mouth every 8 (eight) hours as needed. Patient not taking: No sig reported 09/17/20   [name], MD  methocarbamol (ROBAXIN) 500 MG tablet Take 1 tablet (500 mg total) by mouth at bedtime. Patient not taking: No sig reported 09/04/20   Carole Civil, MD  metoCLOPramide (REGLAN) 10 MG tablet Take 1 tablet (10 mg total) by mouth every 8 (eight) hours as needed for nausea. 12/05/20   Noemi Chapel, MD  Multiple Vitamins-Calcium (ONE-A-DAY WOMENS PO) Take 1 tablet by mouth daily.    [provider]  norethindrone (MICRONOR) 0.35 MG tablet Take 1 tablet (0.35 mg total) by mouth daily. Patient not taking: No sig reported 10/17/20   [name], MD  ondansetron (ZOFRAN ODT) 8 MG disintegrating tablet Take 1 tablet (8 mg total) by mouth every 8 (eight) hours as needed for nausea or vomiting. Patient not taking: No sig reported 09/17/20   [name], MD  apixaban (ELIQUIS) 5 MG TABS tablet Take 2 tablets ('10mg'$ ) twice daily for 7 days, then 1 tablet ('5mg'$ ) twice daily 04/20/19 04/20/19  Rodell Perna A, PA-C  levonorgestrel (MIRENA) 20 MCG/24HR IUD 1 each by Intrauterine route once.   06/25/20  [provider]    Allergies    Shellfish allergy, Sulfa antibiotics, and Adhesive [tape]  Review of Systems   Review of Systems  Constitutional:  Negative for chills and fever.  HENT:  Negative for sore throat.   Eyes:  Negative for redness.  Respiratory:  Negative for cough.   Cardiovascular:  Positive for palpitations. Negative for leg swelling.  Gastrointestinal:  Negative for abdominal pain, nausea and vomiting.  Genitourinary:  Negative for dysuria and flank pain.  Musculoskeletal:   Negative for back pain and neck pain.  Skin:  Negative for rash.  Neurological:  Negative for syncope and headaches.  Hematological:  Does not bruise/bleed easily.  Psychiatric/Behavioral:  Negative for confusion.    Physical Exam Updated Vital Signs BP 140/66   Pulse 84   Temp 98.9 F (37.2 C) (Oral)   Resp (!) 21   Ht 1.6 m ('5\' 3"'$ )  LMP 11/30/2020   SpO2 99%   BMI 57.93 kg/m   Physical Exam Vitals and nursing note reviewed.  Constitutional:      Appearance: Normal appearance. She is well-developed.  HENT:     Head: Atraumatic.     Nose: Nose normal.     Mouth/Throat:     Mouth: Mucous membranes are moist.  Eyes:     General: No scleral icterus.    Conjunctiva/sclera: Conjunctivae normal.     Pupils: Pupils are equal, round, and reactive to light.  Neck:     Trachea: No tracheal deviation.     Comments: Trachea midline. Thyroid not grossly enlarged or tender.  Cardiovascular:     Rate and Rhythm: Normal rate and regular rhythm.     Pulses: Normal pulses.     Heart sounds: Normal heart sounds. No murmur heard.   No friction rub. No gallop.  Pulmonary:     Effort: Pulmonary effort is normal. No respiratory distress.     Breath sounds: Normal breath sounds.  Abdominal:     General: Bowel sounds are normal. There is no distension.     Palpations: Abdomen is soft.     Tenderness: There is no abdominal tenderness. There is no guarding.  Genitourinary:    Comments: No cva tenderness.  Musculoskeletal:        General: No swelling or tenderness.     Cervical back: Normal range of motion and neck supple. No rigidity. No muscular tenderness.     Right lower leg: No edema.     Left lower leg: No edema.  Skin:    General: Skin is warm and dry.     Findings: No rash.  Neurological:     Mental Status: She is alert.     Comments: Alert, speech normal.   Psychiatric:        Mood and Affect: Mood normal.    ED Results / Procedures / Treatments   Labs (all labs  ordered are listed, but only abnormal results are displayed) Results for orders placed or performed during the hospital encounter of 12/29/20  CBC  Result Value Ref Range   WBC 6.3 4.0 - 10.5 K/uL   RBC 4.81 3.87 - 5.11 MIL/uL   Hemoglobin 13.1 12.0 - 15.0 g/dL   HCT 42.2 36.0 - 46.0 %   MCV 87.7 80.0 - 100.0 fL   MCH 27.2 26.0 - 34.0 pg   MCHC 31.0 30.0 - 36.0 g/dL   RDW 14.5 11.5 - 15.5 %   Platelets 239 150 - 400 K/uL   nRBC 0.0 0.0 - 0.2 %  Basic metabolic panel  Result Value Ref Range   Sodium 135 135 - 145 mmol/L   Potassium 4.2 3.5 - 5.1 mmol/L   Chloride 102 98 - 111 mmol/L   CO2 23 22 - 32 mmol/L   Glucose, Bld 72 70 - 99 mg/dL   BUN 12 6 - 20 mg/dL   Creatinine, Ser 0.63 0.44 - 1.00 mg/dL   Calcium 9.0 8.9 - 10.3 mg/dL   GFR, Estimated >60 >60 mL/min   Anion gap 10 5 - 15  Troponin I (High Sensitivity)  Result Value Ref Range   Troponin I (High Sensitivity) 86 (H) <18 ng/L   EP STUDY  Result Date: 12/26/2020 Conclusion: Successful EP study and catheter ablation of incessant AV node reentrant tachycardia utililizing 3D electroanatomic mapping with the successful ablation carried out on the left side rendering the tachycardia noninducible. [name], MD  EKG EKG Interpretation  Date/Time:  Monday December 29 2020 15:22:58 EDT Ventricular Rate:  88 PR Interval:  132 QRS Duration: 78 QT Interval:  374 QTC Calculation: 452 R Axis:   -5 Text Interpretation: Normal sinus rhythm Nonspecific T wave abnormality No significant change since last tracing Confirmed by Lajean Saver [phone]) on 12/29/2020 7:15:47 PM  Radiology No results found.  Procedures Procedures   Medications Ordered in ED Medications - No data to display  ED Course  I have reviewed the triage vital signs and the nursing notes.  Pertinent labs & imaging results that were available during my care of the patient were reviewed by me and considered in my medical decision making (see chart for  details).    MDM Rules/Calculators/A&P                          Iv ns. Continuous pulse ox and cardiac monitoring. Labs sent. Ecg.   Reviewed nursing notes and prior charts for additional history.  Recent cardiac ablation procedure.   Labs reviewed/interpreted by me - chem normal. Hgb normal. Initial trop is mildly high - possibly explained by rapid heart rate earlier. No chest pain currently, pt is breathing comfortably, no sob.   Pt remains in NSR on monitor. No chest pain or increased wob. Delta trop pending.   Signed out to Dr Christy Gentles to check delta trop. If delta trop not increasing, and remains in nsr with no chest pain - potential d/c to home then with close cardiology f/u.      Final Clinical Impression(s) / ED Diagnoses Final diagnoses:  None    Rx / DC Orders ED Discharge Orders     None        Lajean Saver, MD 12/29/20 2340

## 2020-12-30 ENCOUNTER — Telehealth: Payer: Self-pay | Admitting: Internal Medicine

## 2020-12-30 ENCOUNTER — Emergency Department (HOSPITAL_COMMUNITY): Payer: 59

## 2020-12-30 DIAGNOSIS — R0602 Shortness of breath: Secondary | ICD-10-CM | POA: Diagnosis not present

## 2020-12-30 DIAGNOSIS — R079 Chest pain, unspecified: Secondary | ICD-10-CM | POA: Diagnosis not present

## 2020-12-30 LAB — TROPONIN I (HIGH SENSITIVITY): Troponin I (High Sensitivity): 118 ng/L (ref <18)

## 2020-12-30 NOTE — Anesthesia Postprocedure Evaluation (Signed)
Anesthesia Post Note  Patient: AROOSH VESTAL  Procedure(s) Performed: SVT ABLATION     Patient location during evaluation: Cath Lab Anesthesia Type: General Level of consciousness: patient cooperative and awake Pain management: pain level controlled Vital Signs Assessment: post-procedure vital signs reviewed and stable Respiratory status: spontaneous breathing, nonlabored ventilation, respiratory function stable and patient connected to nasal cannula oxygen Cardiovascular status: blood pressure returned to baseline and stable Postop Assessment: no apparent nausea or vomiting Anesthetic complications: no   Encounter Notable Events  Notable Event Outcome Phase Comment  None  Intraprocedure   None  Intraprocedure     Last Vitals:  Vitals:   12/26/20 1600 12/26/20 1630  BP: 139/61 (!) 131/59  Pulse: (!) 102 (!) 102  Resp: (!) 29 (!) 26  Temp:    SpO2: 98% 97%    Last Pain:  Vitals:   12/29/20 1327  TempSrc:   PainSc: 0-No pain                 Emanuel Dowson

## 2020-12-30 NOTE — Telephone Encounter (Signed)
Spoke with who states that she has chest pain that started on yesterday at 0530. She continues to have chest discomfort. She rates it as a 9/10.Pt denies SOB, sweating and nausea. She states that she called the office and was instructed to be seen in the ER. Pt was seen and states that her troponin levels are elevated. Pt is very concerned about the elevated troponin levels. Current BP 136/74 Hr 103. Please advise.

## 2020-12-30 NOTE — ED Notes (Signed)
Date and time results received: 12/30/20 0114  Test: troponin Critical Value: 118  Name of Provider Notified: Christy Gentles, MD  Orders Received? Or Actions Taken?: acknowledged

## 2020-12-30 NOTE — ED Provider Notes (Signed)
I assumed care at signout.  Patient with recent SVT ablation now having palpitations, chest pressure and elevated troponin.  Repeat troponin is mildly elevated compared to original.  Patient reports mild chest pressure, but no other complaints.  No dysrhythmias on EKG or telemetry monitoring. Discussed with Dr. Blossom Hoops with cardiology. He has  reviewed EKG and labs.  Elevated troponin likely due to recent SVT and ablation. Plan to discharge home and will help arrange close follow-up with cardiologist this week.  Patient agreeable with plan.  We discussed return precautions    Ripley Fraise, MD 12/30/20 [phone]

## 2020-12-30 NOTE — Discharge Instructions (Signed)
It was our pleasure to provide your ER care today - we hope that you feel better.  Follow up with your cardiologist in the coming week - call office in AM to arrange appointment.   Return to ER if worse, new symptoms, persistent fast heart beating, chest pain, trouble breathing, fainting, or other concern.

## 2020-12-30 NOTE — Telephone Encounter (Signed)
Patient called stating that she continues to have chest discomfort. States that she was seen in the ER at [facility] 12-29-2020.

## 2020-12-30 NOTE — Telephone Encounter (Signed)
Pt notified and reassured

## 2020-12-30 NOTE — Telephone Encounter (Signed)
Left detailed message for Pt.  Advised chest discomfort s/p ablation was not abnormal.  Advised elevated troponins s/p ablation were not abnormal.  Advised would discuss with GT and call Pt back with further direction.

## 2020-12-31 ENCOUNTER — Ambulatory Visit (INDEPENDENT_AMBULATORY_CARE_PROVIDER_SITE_OTHER): Payer: 59 | Admitting: Student

## 2020-12-31 ENCOUNTER — Encounter: Payer: Self-pay | Admitting: Student

## 2020-12-31 VITALS — BP 122/84 | HR 84 | Ht 63.0 in | Wt 323.0 lb

## 2020-12-31 DIAGNOSIS — R002 Palpitations: Secondary | ICD-10-CM | POA: Diagnosis not present

## 2020-12-31 DIAGNOSIS — I471 Supraventricular tachycardia: Secondary | ICD-10-CM

## 2020-12-31 DIAGNOSIS — G4733 Obstructive sleep apnea (adult) (pediatric): Secondary | ICD-10-CM

## 2020-12-31 NOTE — Patient Instructions (Signed)
Medication Instructions:   Take Motrin 600 mg every 8 hours for the next 4-5 days  *If you need a refill on your cardiac medications before your next appointment, please call your pharmacy*   Lab Work: NONE   If you have labs (blood work) drawn today and your tests are completely normal, you will receive your results only by: [place] (if you have MyChart) OR A paper copy in the mail If you have any lab test that is abnormal or we need to change your treatment, we will call you to review the results.   Testing/Procedures: NONE    Follow-Up: At [facility], you and your health needs are our priority.  As part of our continuing mission to provide you with exceptional heart care, we have created designated Provider Care Teams.  These Care Teams include your primary Cardiologist (physician) and Advanced Practice Providers (APPs -  Physician Assistants and Nurse Practitioners) who all work together to provide you with the care you need, when you need it.  We recommend signing up for the patient portal called "MyChart".  Sign up information is provided on this After Visit Summary.  MyChart is used to connect with patients for Virtual Visits (Telemedicine).  Patients are able to view lab/test results, encounter notes, upcoming appointments, etc.  Non-urgent messages can be sent to your provider as well.   To learn more about what you can do with MyChart, go to [email or web address].    Your next appointment:    As Planned   The format for your next appointment:   In Person  Provider:   [name], MD   Other Instructions Thank you for choosing [facility]!

## 2020-12-31 NOTE — Progress Notes (Signed)
Cardiology Office Note    Date:  12/31/2020   ID:  Jessica Sanchez, DOB 07-25-1985, MRN [id number]  PCP:  Pcp, No  Cardiologist: Previously Dr. Bronson Ing EP: Dr. Lovena Le  Chief Complaint  Patient presents with   Follow-up    Recent ablation and Emergency Dept visit    History of Present Illness:    Jessica Sanchez is a 35 y.o. female with past medical history of SVT (s/p ablation in 11/2018, recurrence in 2021 and started on Flecainide), OSA and uterine fibroids who presents to the office today for follow-up from a recent Emergency Department visit.  She was examined by Dr. Lovena Le on 12/09/2020 and had been to the Emergency Department multiple times for recurrent SVT despite titration of Flecainide. It was recommended that she undergo a repeat EP study and ablation. She presented to [facility] on 12/26/2020 for planned ablation and underwent successful ablation of incessant AV node reentrant tachycardia. She was discharged on the same day of her procedure.  She called the office on 12/29/2020 reporting dyspnea, palpitations and chest tightness and was advised to go to the ED for further evaluation. She was in normal sinus rhythm upon arrival and maintained normal sinus rhythm on telemetry. Her Hs Troponin values were elevated at 86 and 118 but felt to be secondary to her recent ablation. Her case was reviewed with Dr. Lovena Le following ED discharge and it was recommended she take ibuprofen as needed  In talking with the patient today, she reports having intermittent chest discomfort since her recent ablation. Typically worse with deep breathing or coughing. Did take ibuprofen once yesterday but has not taken routinely. She has experienced intermittent palpitations but reports they spontaneously resolve. She denies any specific orthopnea or PND. Says that she has not utilized her CPAP routinely due to the mask not fitting correctly and due to having an older machine as she obtained this from a family  member as insurance at the time would not cover a CPAP. She reports she has changed insurance in the interim.  Past Medical History:  Diagnosis Date   Arthritis    right shoulder   Dyspnea    Dysrhythmia    hx of SVT   Fibroid (bleeding) (uterine)    Fibroid, uterine    H/O metrorrhagia    Hypertension    IUD (intrauterine device) in place 07/11/2015   Menorrhagia    Obesity, Class III, BMI 40-49.9 (morbid obesity) ([place]) 11/11/2018   Sleep apnea    uses CPAP - setting 11   SVT (supraventricular tachycardia) ([place])     Past Surgical History:  Procedure Laterality Date   HYSTEROSCOPY N/A 09/17/2020   Procedure: HYSTEROSCOPY;  Surgeon: [name], MD;  Location: [facility];  Service: Gynecology;  Laterality: N/A;   HYSTEROSCOPY WITH D & C N/A 09/27/2013   Procedure: DILATATION AND CURETTAGE /HYSTEROSCOPY and insertion of Mirena IUD ;  Surgeon: [name]. Harrington Challenger, MD;  Location: [place] [facility];  Service: Gynecology;  Laterality: N/A;   IUD REMOVAL N/A 09/17/2020   Procedure: INTRAUTERINE DEVICE (IUD) REMOVAL (2);  Surgeon: [name], MD;  Location: [facility];  Service: Gynecology;  Laterality: N/A;   MYOMECTOMY  02/03/2011   Procedure: MYOMECTOMY;  Surgeon: [name], MD;  Location: [facility];  Service: Gynecology;  Laterality: N/A;   SVT ABLATION N/A 11/08/2018   Procedure: SVT ABLATION;  Surgeon: [name], MD;  Location: [place] [facility];  Service: Cardiovascular;  Laterality: N/A;  SVT ABLATION N/A 12/26/2020   Procedure: SVT ABLATION;  Surgeon: [name], MD;  Location: [place] [facility];  Service: Cardiovascular;  Laterality: N/A;   TONSILLECTOMY      Current Medications: Outpatient Medications Prior to Visit  Medication Sig Dispense Refill   albuterol (PROVENTIL HFA) 108 (90 Base) MCG/ACT inhaler Inhale 1-2 puffs into the lungs every 6 (six) hours as needed for wheezing or shortness of breath.      flecainide (TAMBOCOR) 100 MG tablet Take 1 tablet (100 mg total) by mouth 2  (two) times daily. 180 tablet 0   Multiple Vitamins-Calcium (ONE-A-DAY WOMENS PO) Take 1 tablet by mouth daily.     acetaminophen (TYLENOL) 325 MG tablet Take 650 mg by mouth every 6 (six) hours as needed for headache. (Patient not taking: Reported on 12/31/2020)     ketorolac (TORADOL) 10 MG tablet Take 1 tablet (10 mg total) by mouth every 8 (eight) hours as needed. (Patient not taking: No sig reported) 15 tablet 0   methocarbamol (ROBAXIN) 500 MG tablet Take 1 tablet (500 mg total) by mouth at bedtime. (Patient not taking: No sig reported) 60 tablet 1   metoCLOPramide (REGLAN) 10 MG tablet Take 1 tablet (10 mg total) by mouth every 8 (eight) hours as needed for nausea. (Patient not taking: Reported on 12/31/2020) 10 tablet 0   norethindrone (MICRONOR) 0.35 MG tablet Take 1 tablet (0.35 mg total) by mouth daily. (Patient not taking: No sig reported) 28 tablet 11   ondansetron (ZOFRAN ODT) 8 MG disintegrating tablet Take 1 tablet (8 mg total) by mouth every 8 (eight) hours as needed for nausea or vomiting. (Patient not taking: No sig reported) 8 tablet 0   No facility-administered medications prior to visit.     Allergies:   Shellfish allergy, Sulfa antibiotics, and Adhesive [tape]   Social History   Socioeconomic History   Marital status: Single    Spouse name: Not on file   Number of children: Not on file   Years of education: Not on file   Highest education level: Not on file  Occupational History   Not on file  Tobacco Use   Smoking status: Former    Types: Cigarettes   Smokeless tobacco: Never   Tobacco comments:    socially  Vaping Use   Vaping Use: Never used  Substance and Sexual Activity   Alcohol use: Yes    Comment: occasional   Drug use: No   Sexual activity: Not Currently    Birth control/protection: None  Other Topics Concern   Not on file  Social History Narrative   Not on file   Social Determinants of Health   Financial Resource Strain: Not on file  Food  Insecurity: Not on file  Transportation Needs: Not on file  Physical Activity: Not on file  Stress: Not on file  Social Connections: Not on file     Family History:  The patient's family history includes Cancer in her maternal grandfather; Cirrhosis in her mother; Diabetes in her mother and sister; Hypertension in her sister.   Review of Systems:    Please see the history of present illness.     All other systems reviewed and are otherwise negative except as noted above.   Physical Exam:    VS:  BP 122/84   Pulse 84   Ht '5\' 3"'$  (1.6 m)   Wt (!) 323 lb (146.5 kg)   LMP 12/01/2020   SpO2 95%   BMI  57.22 kg/m    General: Well developed, well nourished,female appearing in no acute distress. Head: Normocephalic, atraumatic. Neck: No carotid bruits. JVD not elevated.  Lungs: Respirations regular and unlabored, without wheezes or rales.  Heart: Regular rate and rhythm. No S3 or S4.  No murmur, no friction rubs, or gallops appreciated. Abdomen: Appears non-distended. No obvious abdominal masses. Msk:  Strength and tone appear normal for age. No obvious joint deformities or effusions. Extremities: No clubbing or cyanosis. No pitting edema.  Distal pedal pulses are 2+ bilaterally. Neuro: Alert and oriented X 3. Moves all extremities spontaneously. No focal deficits noted. Psych:  Responds to questions appropriately with a normal affect. Skin: No rashes or lesions noted  Wt Readings from Last 3 Encounters:  12/31/20 (!) 323 lb (146.5 kg)  12/26/20 (!) 327 lb (148.3 kg)  12/09/20 (!) 327 lb 6.4 oz (148.5 kg)     Studies/Labs Reviewed:   EKG:  EKG is not ordered today. EKG from 12/29/2020 is reviewed and shows NSR, HR 88 with slight TWI along the anterior leads which is similar to prior tracings.   Recent Labs: 09/16/2020: ALT 16 12/29/2020: BUN 12; Creatinine, Ser 0.63; Hemoglobin 13.1; Platelets 239; Potassium 4.2; Sodium 135   Lipid Panel No results found for: CHOL, TRIG, HDL,  CHOLHDL, VLDL, LDLCALC, LDLDIRECT  Additional studies/ records that were reviewed today include:   Echocardiogram: 11/2018 IMPRESSIONS     1. The left ventricle has normal systolic function with an ejection  fraction of 60-65%. The cavity size was normal. Left ventricular diastolic  parameters were normal. No evidence of left ventricular regional wall  motion abnormalities.   2. The right ventricle has normal systolic function. The cavity was  normal. There is no increase in right ventricular wall thickness. Right  ventricular systolic pressure could not be assessed.   3. There is no evidence of pericardial effusion.   GXT: 10/2019 Blood pressure demonstrated a normal response to exercise. There was no ST segment deviation noted during stress.   No ischemia. Normal BP response to exercise. Moderately decreased exercise tolerance given patient's age.  No PR or QRS prolongation with Flecainide.  SVT Ablation: 12/26/2020 Conclusion: Successful EP study and catheter ablation of incessant AV node reentrant tachycardia utililizing 3D electroanatomic mapping with the successful ablation carried out on the left side rendering the tachycardia noninducible.    Assessment:    1. SVT (supraventricular tachycardia) (HCC)   2. Palpitations   3. OSA (obstructive sleep apnea)      Plan:   In order of problems listed above:  1. SVT/Palpitations - She underwent ablation in 11/2018 but has continued to experience recurrent episodes of SVT despite being on antiarrhythmic therapy with Flecainide 100 mg twice daily. Recently underwent a repeat ablation by Dr. Lovena Le on 12/26/2020. - I informed the patient that she may continue to experience intermittent palpitations for the next 6 to 8 weeks following her ablation. Remains on Flecainide for now per EP.  - As previously discussed during prior phone notes, her chest discomfort is likely due to her ablation and her symptoms seem most consistent with  pleuritic pain. I recommended that she take ibuprofen 600 mg every 8 hours for the next 4 to 5 days. If symptoms persist into next week, could consider a limited echocardiogram for assessment of an effusion but at this time I suspect her symptoms are secondary to her recent ablation.  2. OSA - She has not utilized her CPAP routinely due to the  mask not fitting correctly and due to having an older machine as she obtained this from a family member as insurance at the time would not cover a CPAP. I will send a message to the [facility] to see if they can follow-up with the patient and see if a new machine can be obtained given she has a different insurance plan at this time. She is also willing to undergo a follow-up study if needed.      Medication Adjustments/Labs and Tests Ordered: Current medicines are reviewed at length with the patient today.  Concerns regarding medicines are outlined above.  Medication changes, Labs and Tests ordered today are listed in the Patient Instructions below. Patient Instructions  Medication Instructions:   Take Motrin 600 mg every 8 hours for the next 4-5 days  *If you need a refill on your cardiac medications before your next appointment, please call your pharmacy*   Lab Work: NONE   If you have labs (blood work) drawn today and your tests are completely normal, you will receive your results only by: [place] (if you have MyChart) OR A paper copy in the mail If you have any lab test that is abnormal or we need to change your treatment, we will call you to review the results.   Testing/Procedures: NONE    Follow-Up: At [facility], you and your health needs are our priority.  As part of our continuing mission to provide you with exceptional heart care, we have created designated Provider Care Teams.  These Care Teams include your primary Cardiologist (physician) and Advanced Practice Providers (APPs -  Physician Assistants and Nurse  Practitioners) who all work together to provide you with the care you need, when you need it.  We recommend signing up for the patient portal called "MyChart".  Sign up information is provided on this After Visit Summary.  MyChart is used to connect with patients for Virtual Visits (Telemedicine).  Patients are able to view lab/test results, encounter notes, upcoming appointments, etc.  Non-urgent messages can be sent to your provider as well.   To learn more about what you can do with MyChart, go to [email or web address].    Your next appointment:    As Planned   The format for your next appointment:   In Person  Provider:   [name], MD   Other Instructions Thank you for choosing [facility]!     Signed, Erma Heritage, PA-C  12/31/2020 5:15 PM    [facility] [address] [place], [place] [address] Phone: [phone] Fax: [phone]

## 2021-02-03 ENCOUNTER — Ambulatory Visit: Payer: 59

## 2021-02-03 ENCOUNTER — Encounter: Payer: Self-pay | Admitting: Internal Medicine

## 2021-02-03 ENCOUNTER — Other Ambulatory Visit: Payer: Self-pay

## 2021-02-03 ENCOUNTER — Ambulatory Visit (INDEPENDENT_AMBULATORY_CARE_PROVIDER_SITE_OTHER): Payer: 59 | Admitting: Internal Medicine

## 2021-02-03 VITALS — BP 126/82 | HR 81 | Ht 63.0 in | Wt 333.0 lb

## 2021-02-03 DIAGNOSIS — I471 Supraventricular tachycardia, unspecified: Secondary | ICD-10-CM

## 2021-02-03 NOTE — Progress Notes (Unsigned)
Enrolled patient for a 14 day Zio XT monitor to be mailed to patients home  Status Lost in [place] system. Order will be canceled

## 2021-02-03 NOTE — Progress Notes (Signed)
HPI Jessica Sanchez returns today for followup of SVT. She is a pleasant 35 yo woman with morbid obesity who underwent EP study and catheter ablation over 2 years ago. At that time she was found to have inducible SVT. He Koch's triangle was quite large and ablation was limited by VA block during junctional rhythm. She developed recurrent symptoms and SVT and has refused repeat ablation. 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 Ablation was successful. Prior to ablation during the EP study she had incessant SVT and after ablation she was non-inducible and PR was less than RR. She has had recurrent symptoms occaisionally over the last 6 weeks. We have not had any documented SVT however.   Allergies  Allergen Reactions   Shellfish Allergy Shortness Of Breath, Itching and Swelling    Mouth became swollen, throat started itching, and developed shortness of breath   Sulfa Antibiotics Itching, Shortness Of Breath and Swelling    Mouth became swollen, throat started itching, and developed shortness of breath   Adhesive [Tape] Itching    Depends on the type of medical tape     Current Outpatient Medications  Medication Sig Dispense Refill   acetaminophen (TYLENOL) 325 MG tablet Take 650 mg by mouth every 6 (six) hours as needed for headache.     albuterol (PROVENTIL HFA) 108 (90 Base) MCG/ACT inhaler Inhale 1-2 puffs into the lungs every 6 (six) hours as needed for wheezing or shortness of breath.      flecainide (TAMBOCOR) 100 MG tablet Take 1 tablet (100 mg total) by mouth 2 (two) times daily. 180 tablet 0   ketorolac (TORADOL) 10 MG tablet Take 1 tablet (10 mg total) by mouth every 8 (eight) hours as needed. 15 tablet 0   Multiple Vitamins-Calcium (ONE-A-DAY WOMENS PO) Take 1 tablet by mouth daily.     methocarbamol (ROBAXIN) 500 MG tablet Take 1 tablet (500 mg total) by mouth at bedtime. (Patient not taking: No sig  reported) 60 tablet 1   metoCLOPramide (REGLAN) 10 MG tablet Take 1 tablet (10 mg total) by mouth every 8 (eight) hours as needed for nausea. (Patient not taking: No sig reported) 10 tablet 0   norethindrone (MICRONOR) 0.35 MG tablet Take 1 tablet (0.35 mg total) by mouth daily. (Patient not taking: No sig reported) 28 tablet 11   ondansetron (ZOFRAN ODT) 8 MG disintegrating tablet Take 1 tablet (8 mg total) by mouth every 8 (eight) hours as needed for nausea or vomiting. (Patient not taking: No sig reported) 8 tablet 0   No current facility-administered medications for this visit.     Past Medical History:  Diagnosis Date   Arthritis    right shoulder   Dyspnea    Dysrhythmia    hx of SVT   Fibroid (bleeding) (uterine)    Fibroid, uterine    H/O metrorrhagia    Hypertension    IUD (intrauterine device) in place 07/11/2015   Menorrhagia    Obesity, Class III, BMI 40-49.9 (morbid obesity) ([place]) 11/11/2018   Sleep apnea    uses CPAP - setting 11   SVT (supraventricular tachycardia) ([place])     ROS:   All systems reviewed and negative except as noted in the HPI.   Past Surgical History:  Procedure Laterality Date   HYSTEROSCOPY N/A 09/17/2020   Procedure: HYSTEROSCOPY;  Surgeon: [name], MD;  Location: [facility];  Service: Gynecology;  Laterality: N/A;   HYSTEROSCOPY WITH D & C N/A 09/27/2013   Procedure: DILATATION AND CURETTAGE /HYSTEROSCOPY and insertion of Mirena IUD ;  Surgeon: [name]. Harrington Challenger, MD;  Location: [place] [facility];  Service: Gynecology;  Laterality: N/A;   IUD REMOVAL N/A 09/17/2020   Procedure: INTRAUTERINE DEVICE (IUD) REMOVAL (2);  Surgeon: [name], MD;  Location: [facility];  Service: Gynecology;  Laterality: N/A;   MYOMECTOMY  02/03/2011   Procedure: MYOMECTOMY;  Surgeon: [name], MD;  Location: [facility];  Service: Gynecology;  Laterality: N/A;   SVT ABLATION N/A 11/08/2018   Procedure: SVT ABLATION;  Surgeon: [name], MD;  Location: [place] [facility];   Service: Cardiovascular;  Laterality: N/A;   SVT ABLATION N/A 12/26/2020   Procedure: SVT ABLATION;  Surgeon: [name], MD;  Location: [place] [facility];  Service: Cardiovascular;  Laterality: N/A;   TONSILLECTOMY       Family History  Problem Relation Age of Onset   Cirrhosis Mother    Diabetes Mother    Cancer Maternal Grandfather    Hypertension Sister    Diabetes Sister    Anesthesia problems Neg Hx    Hypotension Neg Hx    Malignant hyperthermia Neg Hx    Pseudochol deficiency Neg Hx      Social History   Socioeconomic History   Marital status: Single    Spouse name: Not on file   Number of children: Not on file   Years of education: Not on file   Highest education level: Not on file  Occupational History   Not on file  Tobacco Use   Smoking status: Former    Types: Cigarettes   Smokeless tobacco: Never   Tobacco comments:    socially  Vaping Use   Vaping Use: Never used  Substance and Sexual Activity   Alcohol use: Yes    Comment: occasional   Drug use: No   Sexual activity: Not Currently    Birth control/protection: None  Other Topics Concern   Not on file  Social History Narrative   Not on file   Social Determinants of Health   Financial Resource Strain: Not on file  Food Insecurity: Not on file  Transportation Needs: Not on file  Physical Activity: Not on file  Stress: Not on file  Social Connections: Not on file  Intimate Partner Violence: Not on file     BP 126/82   Pulse 81   Ht 5\' 3"  (1.6 m)   Wt (!) 333 lb (151 kg)   BMI 58.99 kg/m   Physical Exam:  Well appearing NAD HEENT: Unremarkable Neck:  No JVD, no thyromegally Lymphatics:  No adenopathy Back:  No CVA tenderness Lungs:  Clear with no wheezes HEART:  Regular rate rhythm, no murmurs, no rubs, no clicks Abd:  soft, positive bowel sounds, no organomegally, no rebound, no guarding Ext:  2 plus pulses, no edema, no cyanosis, no clubbing Skin:  No rashes no  nodules Neuro:  CN II through XII intact, motor grossly intact  EKG - nsr   Assess/Plan:  1. SVT - I have discussed the treatment options and recommended she undergo a zio monitor to confirm that she has had break through SVT.  2. Obesity - she needs to lose weight.  3. HTN - her sbp is fairly well controlled. Weight loss would help. 4. Sleep apnea - she is encouraged to get a new cpap unit to treat her severe sleep  apnea.   [email or web address].

## 2021-02-03 NOTE — Patient Instructions (Addendum)
Medication Instructions:  Your physician recommends that you continue on your current medications as directed. Please refer to the Current Medication list given to you today.  Labwork: None ordered.  Testing/Procedures: Your physician has recommended that you wear a holter monitor. Holter monitors are medical devices that record the heart's electrical activity. Doctors most often use these monitors to diagnose arrhythmias. Arrhythmias are problems with the speed or rhythm of the heartbeat. The monitor is a small, portable device. You can wear one while you do your normal daily activities. This is usually used to diagnose what is causing palpitations/syncope (passing out).  You are going to wear a 14 day ZIO monitor  Follow-Up: Your physician wants you to follow-up based on results of your ZIO with [name], MD    Any Other Special Instructions Will Be Listed Below (If Applicable).  If you need a refill on your cardiac medications before your next appointment, please call your pharmacy.   Your physician has recommended that you wear a Zio monitor.   This monitor is a medical device that records the heart's electrical activity. Doctors most often use these monitors to diagnose arrhythmias. Arrhythmias are problems with the speed or rhythm of the heartbeat. The monitor is a small device applied to your chest. You can wear one while you do your normal daily activities. While wearing this monitor if you have any symptoms to push the button and record what you felt. Once you have worn this monitor for the period of time provider prescribed (Usually 14 days), you will return the monitor device in the postage paid box. Once it is returned they will download the data collected and provide [place] with a report which the provider will then review and we will call you with those results. Important tips:  Avoid showering during the first 24 hours of wearing the monitor. Avoid excessive sweating to help  maximize wear time. Do not submerge the device, no hot tubs, and no swimming pools. Keep any lotions or oils away from the patch. After 24 hours you may shower with the patch on. Take brief showers with your back facing the shower head.  Do not remove patch once it has been placed because that will interrupt data and decrease adhesive wear time. Push the button when you have any symptoms and write down what you were feeling. Once you have completed wearing your monitor, remove and place into box which has postage paid and place in your outgoing mailbox.  If for some reason you have misplaced your box then call our office and we can provide another box and/or mail it off for you.

## 2021-02-10 ENCOUNTER — Telehealth: Payer: Self-pay | Admitting: Internal Medicine

## 2021-02-10 NOTE — Telephone Encounter (Signed)
Patient is having trouble with her heart monitor.,

## 2021-02-10 NOTE — Telephone Encounter (Signed)
Spoke with pt who states that monitor will not stay on and is flashing orange. Pt will have company sent her another monitor and then call [place] office to have placed.

## 2021-02-23 ENCOUNTER — Emergency Department (HOSPITAL_COMMUNITY)
Admission: EM | Admit: 2021-02-23 | Discharge: 2021-02-24 | Disposition: A | Discharge status: Home / Self Care | Payer: 59 | Attending: Emergency Medicine | Admitting: Emergency Medicine

## 2021-02-23 ENCOUNTER — Encounter (HOSPITAL_COMMUNITY): Payer: Self-pay | Admitting: Emergency Medicine

## 2021-02-23 ENCOUNTER — Emergency Department (HOSPITAL_COMMUNITY): Payer: 59

## 2021-02-23 DIAGNOSIS — R42 Dizziness and giddiness: Secondary | ICD-10-CM | POA: Diagnosis not present

## 2021-02-23 DIAGNOSIS — Z5321 Procedure and treatment not carried out due to patient leaving prior to being seen by health care provider: Secondary | ICD-10-CM | POA: Insufficient documentation

## 2021-02-23 DIAGNOSIS — R002 Palpitations: Secondary | ICD-10-CM | POA: Diagnosis not present

## 2021-02-23 DIAGNOSIS — R519 Headache, unspecified: Secondary | ICD-10-CM | POA: Insufficient documentation

## 2021-02-23 DIAGNOSIS — R0602 Shortness of breath: Secondary | ICD-10-CM | POA: Diagnosis not present

## 2021-02-23 DIAGNOSIS — R079 Chest pain, unspecified: Secondary | ICD-10-CM | POA: Diagnosis not present

## 2021-02-23 DIAGNOSIS — R0789 Other chest pain: Secondary | ICD-10-CM | POA: Insufficient documentation

## 2021-02-23 LAB — BASIC METABOLIC PANEL
Anion gap: 9 (ref 5–15)
BUN: 11 mg/dL (ref 6–20)
CO2: 22 mmol/L (ref 22–32)
Calcium: 9.3 mg/dL (ref 8.9–10.3)
Chloride: 103 mmol/L (ref 98–111)
Creatinine, Ser: 0.73 mg/dL (ref 0.44–1.00)
GFR, Estimated: >60 mL/min (ref >60)
Glucose, Bld: 110 mg/dL — ABNORMAL HIGH (ref 70–99)
Potassium: 4.2 mmol/L (ref 3.5–5.1)
Sodium: 134 mmol/L — ABNORMAL LOW (ref 135–145)

## 2021-02-23 LAB — CBC
HCT: 39.2 % (ref 36.0–46.0)
Hemoglobin: 12.4 g/dL (ref 12.0–15.0)
MCH: 27 pg (ref 26.0–34.0)
MCHC: 31.6 g/dL (ref 30.0–36.0)
MCV: 85.4 fL (ref 80.0–100.0)
Platelets: 319 10*3/uL (ref 150–400)
RBC: 4.59 MIL/uL (ref 3.87–5.11)
RDW: 14.8 % (ref 11.5–15.5)
WBC: 6.8 10*3/uL (ref 4.0–10.5)
nRBC: 0 % (ref 0.0–0.2)

## 2021-02-23 LAB — TROPONIN I (HIGH SENSITIVITY)
Troponin I (High Sensitivity): 2 ng/L (ref <18)
Troponin I (High Sensitivity): 4 ng/L (ref <18)

## 2021-02-23 LAB — I-STAT BETA HCG BLOOD, ED (MC, WL, AP ONLY): I-stat hCG, quantitative: <5 m[IU]/mL (ref <5)

## 2021-02-23 LAB — BRAIN NATRIURETIC PEPTIDE: B Natriuretic Peptide: 7.7 pg/mL (ref 0.0–100.0)

## 2021-02-23 NOTE — ED Provider Notes (Signed)
Emergency Medicine Provider Triage Evaluation Note  Jessica Sanchez , a 35 y.o. female  was evaluated in triage.  Pt complains of , central chest pressure/pain, palpitations, lightheadedness, and headache since this morning.  Patient with history of SVT status post ablation x2.  States that this all started last night and has been intermittent for her.  Has taken her as needed medicine prescribed to her by her cardiologist without relief.  Patient is currently homeless, residing on her friend's spare room.  Patient was seen by Dr. Lovena Le, electrophysiologist on 10/4.Marland Kitchen  Review of Systems  Positive: CP, palpitations, SOB, lightheadedness, HA Negative: N/V/D,fevers  Physical Exam  BP (!) 149/82 (BP Location: Left Wrist)   Pulse 88   Temp 98 F (36.7 C)   Resp 18   SpO2 100%  Gen:   Awake, no distress   Resp:  Normal effort  MSK:   Moves extremities without difficulty  Other:  RRR no m/r/g. Lungs CTAB.  Medical Decision Making  Medically screening exam initiated at 5:41 PM.  Appropriate orders placed.  Jessica Sanchez was informed that the remainder of the evaluation will be completed by another provider, this initial triage assessment does not replace that evaluation, and the importance of remaining in the ED until their evaluation is complete.  This chart was dictated using voice recognition software, Dragon. Despite the best efforts of this provider to proofread and correct errors, errors may still occur which can change documentation meaning.    Emeline Darling, PA-C 02/23/21 1757    Tegeler, Gwenyth Allegra, MD 02/23/21 2230

## 2021-02-23 NOTE — ED Triage Notes (Signed)
Pt endorses central CP, dizziness and HA since this morning. Hx of ablation a month ago. Describes pain as a pressure and tightness. SOB has improved.

## 2021-02-24 ENCOUNTER — Telehealth: Payer: Self-pay | Admitting: *Deleted

## 2021-02-24 DIAGNOSIS — G4733 Obstructive sleep apnea (adult) (pediatric): Secondary | ICD-10-CM

## 2021-02-24 NOTE — ED Notes (Signed)
Pt left  Pt was encouraged to stay

## 2021-03-02 ENCOUNTER — Ambulatory Visit: Payer: 59 | Admitting: Obstetrics & Gynecology

## 2021-03-16 DIAGNOSIS — I10 Essential (primary) hypertension: Secondary | ICD-10-CM | POA: Diagnosis not present

## 2021-03-16 DIAGNOSIS — Z87891 Personal history of nicotine dependence: Secondary | ICD-10-CM | POA: Insufficient documentation

## 2021-03-16 DIAGNOSIS — J069 Acute upper respiratory infection, unspecified: Secondary | ICD-10-CM | POA: Diagnosis not present

## 2021-03-16 DIAGNOSIS — Z7901 Long term (current) use of anticoagulants: Secondary | ICD-10-CM | POA: Insufficient documentation

## 2021-03-16 DIAGNOSIS — I517 Cardiomegaly: Secondary | ICD-10-CM | POA: Diagnosis not present

## 2021-03-16 DIAGNOSIS — Z20822 Contact with and (suspected) exposure to covid-19: Secondary | ICD-10-CM | POA: Insufficient documentation

## 2021-03-16 DIAGNOSIS — H66005 Acute suppurative otitis media without spontaneous rupture of ear drum, recurrent, left ear: Secondary | ICD-10-CM | POA: Insufficient documentation

## 2021-03-16 DIAGNOSIS — H66015 Acute suppurative otitis media with spontaneous rupture of ear drum, recurrent, left ear: Secondary | ICD-10-CM | POA: Diagnosis not present

## 2021-03-16 DIAGNOSIS — R0602 Shortness of breath: Secondary | ICD-10-CM | POA: Diagnosis not present

## 2021-03-16 DIAGNOSIS — R059 Cough, unspecified: Secondary | ICD-10-CM | POA: Diagnosis not present

## 2021-03-16 DIAGNOSIS — R079 Chest pain, unspecified: Secondary | ICD-10-CM | POA: Diagnosis not present

## 2021-03-16 DIAGNOSIS — B9789 Other viral agents as the cause of diseases classified elsewhere: Secondary | ICD-10-CM | POA: Diagnosis not present

## 2021-03-17 ENCOUNTER — Emergency Department (HOSPITAL_COMMUNITY): Payer: 59

## 2021-03-17 ENCOUNTER — Emergency Department (HOSPITAL_COMMUNITY)
Admission: EM | Admit: 2021-03-17 | Discharge: 2021-03-17 | Disposition: A | Discharge status: Home / Self Care | Payer: 59 | Attending: Emergency Medicine | Admitting: Emergency Medicine

## 2021-03-17 ENCOUNTER — Other Ambulatory Visit: Payer: Self-pay

## 2021-03-17 DIAGNOSIS — R0602 Shortness of breath: Secondary | ICD-10-CM | POA: Diagnosis not present

## 2021-03-17 DIAGNOSIS — H66005 Acute suppurative otitis media without spontaneous rupture of ear drum, recurrent, left ear: Secondary | ICD-10-CM

## 2021-03-17 DIAGNOSIS — I517 Cardiomegaly: Secondary | ICD-10-CM | POA: Diagnosis not present

## 2021-03-17 DIAGNOSIS — R079 Chest pain, unspecified: Secondary | ICD-10-CM | POA: Diagnosis not present

## 2021-03-17 DIAGNOSIS — J069 Acute upper respiratory infection, unspecified: Secondary | ICD-10-CM

## 2021-03-17 DIAGNOSIS — R059 Cough, unspecified: Secondary | ICD-10-CM | POA: Diagnosis not present

## 2021-03-17 LAB — BASIC METABOLIC PANEL
Anion gap: 8 (ref 5–15)
BUN: 12 mg/dL (ref 6–20)
CO2: 24 mmol/L (ref 22–32)
Calcium: 8.6 mg/dL — ABNORMAL LOW (ref 8.9–10.3)
Chloride: 102 mmol/L (ref 98–111)
Creatinine, Ser: 0.65 mg/dL (ref 0.44–1.00)
GFR, Estimated: >60 mL/min (ref >60)
Glucose, Bld: 87 mg/dL (ref 70–99)
Potassium: 3.8 mmol/L (ref 3.5–5.1)
Sodium: 134 mmol/L — ABNORMAL LOW (ref 135–145)

## 2021-03-17 LAB — CBC
HCT: 37.4 % (ref 36.0–46.0)
Hemoglobin: 11.6 g/dL — ABNORMAL LOW (ref 12.0–15.0)
MCH: 27.3 pg (ref 26.0–34.0)
MCHC: 31 g/dL (ref 30.0–36.0)
MCV: 88 fL (ref 80.0–100.0)
Platelets: 312 10*3/uL (ref 150–400)
RBC: 4.25 MIL/uL (ref 3.87–5.11)
RDW: 14.9 % (ref 11.5–15.5)
WBC: 6.3 10*3/uL (ref 4.0–10.5)
nRBC: 0 % (ref 0.0–0.2)

## 2021-03-17 LAB — RESP PANEL BY RT-PCR (FLU A&B, COVID) ARPGX2
Influenza A by PCR: NEGATIVE
Influenza B by PCR: NEGATIVE
SARS Coronavirus 2 by RT PCR: NEGATIVE

## 2021-03-17 LAB — TROPONIN I (HIGH SENSITIVITY)
Troponin I (High Sensitivity): <2 ng/L (ref <18)
Troponin I (High Sensitivity): <2 ng/L (ref <18)

## 2021-03-17 MED ORDER — ONDANSETRON 4 MG PO TBDP: ORAL_TABLET | ORAL | 0 refills | Status: DC

## 2021-03-17 MED ORDER — AMOXICILLIN 500 MG PO CAPS
1000.0000 mg | ORAL_CAPSULE | Freq: Once | ORAL | Status: AC
  Administered 2021-03-17: 1000 mg via ORAL
  Filled 2021-03-17: qty 2

## 2021-03-17 MED ORDER — ALBUTEROL SULFATE HFA 108 (90 BASE) MCG/ACT IN AERS
2.0000 | INHALATION_SPRAY | Freq: Once | RESPIRATORY_TRACT | Status: AC
  Administered 2021-03-17: 2 via RESPIRATORY_TRACT
  Filled 2021-03-17: qty 6.7

## 2021-03-17 MED ORDER — DEXAMETHASONE 4 MG PO TABS
10.0000 mg | ORAL_TABLET | Freq: Once | ORAL | Status: AC
  Administered 2021-03-17: 10 mg via ORAL
  Filled 2021-03-17: qty 3

## 2021-03-17 MED ORDER — BENZONATATE 100 MG PO CAPS: 100.0000 mg | ORAL_CAPSULE | Freq: Three times a day (TID) | ORAL | 0 refills | Status: DC

## 2021-03-17 MED ORDER — AMOXICILLIN 500 MG PO TABS: 1000.0000 mg | ORAL_TABLET | Freq: Two times a day (BID) | ORAL | 0 refills | Status: AC

## 2021-03-17 NOTE — ED Notes (Signed)
Pt verbalized understanding of d/c instructions, meds, and followup care. Denies questions. VSS, no distress noted. Steady gait to exit with all belongings. Walks with family to lobby, insist on not using wheelchair.

## 2021-03-17 NOTE — Discharge Instructions (Signed)
Take tylenol 2 pills 4 times a day and motrin 4 pills 3 times a day.  Drink plenty of fluids.  Return for worsening shortness of breath, headache, confusion. Follow up with your family doctor.   

## 2021-03-17 NOTE — ED Provider Notes (Signed)
Emergency Medicine Provider Triage Evaluation Note  Jessica Sanchez , a 35 y.o. female  was evaluated in triage.  Pt complains of URI sxs. Reports nasal congestion, ear pain, sore throat, cough, wheezing, and dyspnea. Having some chest discomfort with coughing, otherwise none.   Review of Systems  Positive: Congestion, ear pain, sore throat, cough, wheezing dyspnea, chest pain w/ coughing Negative: Vomiting, diarrhea,  syncope  Physical Exam  BP 129/73 (BP Location: Left Arm)   Pulse 86   Temp 97.9 F (36.6 C) (Oral)   Resp 20   SpO2 100%  Gen:   Awake, no distress   Resp:  Normal effort  MSK:   Moves extremities without difficulty  Other:  No obvious adventitious breath sounds.   Medical Decision Making  Medically screening exam initiated at 12:40 AM.  Appropriate orders placed.  Jessica Sanchez was informed that the remainder of the evaluation will be completed by another provider, this initial triage assessment does not replace that evaluation, and the importance of remaining in the ED until their evaluation is complete.  Cough.    Jessica Sanchez 03/17/21 0050    Palumbo, April, MD 03/17/21 0101

## 2021-03-17 NOTE — ED Triage Notes (Signed)
Patient with shortness of breath, chest pain and congestion.  Patient states that it has been going on for a couple of days.  Patient does have slight sore throat.

## 2021-03-17 NOTE — ED Provider Notes (Signed)
[facility] EMERGENCY DEPARTMENT Provider Note   CSN: [id number] Arrival date & time: 03/16/21  2346     History Chief Complaint  Patient presents with   Shortness of Breath    Jessica Sanchez is a 35 y.o. female.  35 yo F with a chief complaints of URI-like symptoms cough congestion ear pain wheezing going on for a couple days now.  No known sick contacts.  No abdominal discomfort.  Feels like she has some trouble breathing from time to time.  The history is provided by the patient and a significant other.  Shortness of Breath Associated symptoms: chest pain, cough and ear pain   Associated symptoms: no fever, no headaches, no vomiting and no wheezing   Illness Severity:  Moderate Onset quality:  Gradual Duration:  2 days Timing:  Constant Progression:  Unchanged Chronicity:  New Associated symptoms: chest pain, congestion, cough, ear pain and shortness of breath   Associated symptoms: no fever, no headaches, no myalgias, no nausea, no rhinorrhea, no vomiting and no wheezing       Past Medical History:  Diagnosis Date   Arthritis    right shoulder   Dyspnea    Dysrhythmia    hx of SVT   Fibroid (bleeding) (uterine)    Fibroid, uterine    H/O metrorrhagia    Hypertension    IUD (intrauterine device) in place 07/11/2015   Menorrhagia    Obesity, Class III, BMI 40-49.9 (morbid obesity) ([place]) 11/11/2018   Sleep apnea    uses CPAP - setting 11   SVT (supraventricular tachycardia) ([place])     Patient Active Problem List   Diagnosis Date Noted   Encounter for IUD removal    Menorrhagia 09/04/2020   Elevated troponin    Attempted IUD removal, unsuccessful 07/05/2019   Encounter for IUD insertion 07/05/2019   Gastroesophageal reflux disease    Nonspecific chest pain 11/11/2018   Morbid obesity with body mass index (BMI) of 60.0 to 69.9 in adult ([place]) 11/11/2018   Elevated d-dimer 11/11/2018   SVT (supraventricular tachycardia) ([place]) 10/04/2018    IUD (intrauterine device) in place 07/11/2015   Other disorder of menstruation and other abnormal bleeding from female genital tract 10/30/2012    Past Surgical History:  Procedure Laterality Date   HYSTEROSCOPY N/A 09/17/2020   Procedure: HYSTEROSCOPY;  Surgeon: [name], MD;  Location: [facility];  Service: Gynecology;  Laterality: N/A;   HYSTEROSCOPY WITH D & C N/A 09/27/2013   Procedure: DILATATION AND CURETTAGE /HYSTEROSCOPY and insertion of Mirena IUD ;  Surgeon: [name]. Harrington Challenger, MD;  Location: [facility];  Service: Gynecology;  Laterality: N/A;   IUD REMOVAL N/A 09/17/2020   Procedure: INTRAUTERINE DEVICE (IUD) REMOVAL (2);  Surgeon: [name], MD;  Location: [facility];  Service: Gynecology;  Laterality: N/A;   MYOMECTOMY  02/03/2011   Procedure: MYOMECTOMY;  Surgeon: [name], MD;  Location: [facility];  Service: Gynecology;  Laterality: N/A;   SVT ABLATION N/A 11/08/2018   Procedure: SVT ABLATION;  Surgeon: [name], MD;  Location: [facility];  Service: Cardiovascular;  Laterality: N/A;   SVT ABLATION N/A 12/26/2020   Procedure: SVT ABLATION;  Surgeon: [name], MD;  Location: [facility];  Service: Cardiovascular;  Laterality: N/A;   TONSILLECTOMY       OB History     Gravida  1   Para      Term  Preterm      AB  1   Living         SAB  1   IAB      Ectopic      Multiple      Live Births              Family History  Problem Relation Age of Onset   Cirrhosis Mother    Diabetes Mother    Cancer Maternal Grandfather    Hypertension Sister    Diabetes Sister    Anesthesia problems Neg Hx    Hypotension Neg Hx    Malignant hyperthermia Neg Hx    Pseudochol deficiency Neg Hx     Social History   Tobacco Use   Smoking status: Former    Types: Cigarettes   Smokeless tobacco: Never   Tobacco comments:    socially  Media planner   Vaping Use: Never used  Substance Use Topics   Alcohol use: Yes    Comment: occasional    Drug use: No    Home Medications Prior to Admission medications   Medication Sig Start Date End Date Taking? Authorizing Provider  acetaminophen (TYLENOL) 325 MG tablet Take 650 mg by mouth every 6 (six) hours as needed for headache.   Yes [provider]  albuterol (PROVENTIL HFA) 108 (90 Base) MCG/ACT inhaler Inhale 1-2 puffs into the lungs every 6 (six) hours as needed for wheezing or shortness of breath.    Yes [provider]  amoxicillin (AMOXIL) 500 MG tablet Take 2 tablets (1,000 mg total) by mouth 2 (two) times daily for 10 days. 03/17/21 03/27/21 Yes Deno Etienne, DO  benzonatate (TESSALON) 100 MG capsule Take 1 capsule (100 mg total) by mouth every 8 (eight) hours. 03/17/21  Yes Deno Etienne, DO  flecainide (TAMBOCOR) 100 MG tablet Take 1 tablet (100 mg total) by mouth 2 (two) times daily. 11/13/20  Yes [name], MD  Multiple Vitamins-Calcium (ONE-A-DAY WOMENS PO) Take 1 tablet by mouth daily.   Yes [provider]  ondansetron (ZOFRAN ODT) 4 MG disintegrating tablet 4mg  ODT q4 hours prn nausea/vomit 03/17/21  Yes Deno Etienne, DO  apixaban (ELIQUIS) 5 MG TABS tablet Take 2 tablets (10mg ) twice daily for 7 days, then 1 tablet (5mg ) twice daily 04/20/19 04/20/19  Rodell Perna A, PA-C  levonorgestrel (MIRENA) 20 MCG/24HR IUD 1 each by Intrauterine route once.   06/25/20  [provider]    Allergies    Shellfish allergy, Sulfa antibiotics, and Adhesive [tape]  Review of Systems   Review of Systems  Constitutional:  Negative for chills and fever.  HENT:  Positive for congestion, ear pain and sinus pressure. Negative for rhinorrhea.   Eyes:  Negative for redness and visual disturbance.  Respiratory:  Positive for cough and shortness of breath. Negative for wheezing.   Cardiovascular:  Positive for chest pain. Negative for palpitations.  Gastrointestinal:  Negative for nausea and vomiting.  Genitourinary:  Negative for dysuria and urgency.   Musculoskeletal:  Negative for arthralgias and myalgias.  Skin:  Negative for pallor and wound.  Neurological:  Negative for dizziness and headaches.   Physical Exam Updated Vital Signs BP 134/65   Pulse 81   Temp 97.7 F (36.5 C) (Oral)   Resp (!) 21   SpO2 99%   Physical Exam Vitals and nursing note reviewed.  Constitutional:      General: She is not in acute distress.    Appearance: She is  well-developed. She is not diaphoretic.  HENT:     Head: Normocephalic and atraumatic.     Comments: Swollen turbinates, posterior nasal drip, no noted sinus ttp.  Left TM with distortion of landmarks and is an effusion versus chronic scarring. Eyes:     Pupils: Pupils are equal, round, and reactive to light.  Cardiovascular:     Rate and Rhythm: Normal rate and regular rhythm.     Heart sounds: No murmur heard.   No friction rub. No gallop.  Pulmonary:     Effort: Pulmonary effort is normal.     Breath sounds: No wheezing or rales.  Abdominal:     General: There is no distension.     Palpations: Abdomen is soft.     Tenderness: There is no abdominal tenderness.  Musculoskeletal:        General: No tenderness.     Cervical back: Normal range of motion and neck supple.  Skin:    General: Skin is warm and dry.  Neurological:     Mental Status: She is alert and oriented to person, place, and time.  Psychiatric:        Behavior: Behavior normal.    ED Results / Procedures / Treatments   Labs (all labs ordered are listed, but only abnormal results are displayed) Labs Reviewed  CBC - Abnormal; Notable for the following components:      Result Value   Hemoglobin 11.6 (*)    All other components within normal limits  BASIC METABOLIC PANEL - Abnormal; Notable for the following components:   Sodium 134 (*)    Calcium 8.6 (*)    All other components within normal limits  RESP PANEL BY RT-PCR (FLU A&B, COVID) ARPGX2  TROPONIN I (HIGH SENSITIVITY)  TROPONIN I (HIGH SENSITIVITY)     EKG None  Radiology DG Chest 2 View  Result Date: 03/17/2021 CLINICAL DATA:  Coughing and shortness of breath with chest pain and congestion. EXAM: CHEST - 2 VIEW COMPARISON:  Portable chest 02/23/2021. FINDINGS: The heart is slightly enlarged. Central vessels are normal caliber. The lungs are clear and no pleural effusion is seen. There is thoracic spondylosis, slight thoracic levoscoliosis. IMPRESSION: No evidence of acute chest disease or interval changes. Stable chest with slight cardiomegaly. Electronically Signed   By: Telford Nab M.D.   On: 03/17/2021 01:45    Procedures Procedures   Medications Ordered in ED Medications  amoxicillin (AMOXIL) capsule 1,000 mg (1,000 mg Oral Given 03/17/21 1535)  dexamethasone (DECADRON) tablet 10 mg (10 mg Oral Given 03/17/21 1535)  albuterol (VENTOLIN HFA) 108 (90 Base) MCG/ACT inhaler 2 puff (2 puffs Inhalation Given 03/17/21 1536)    ED Course  I have reviewed the triage vital signs and the nursing notes.  Pertinent labs & imaging results that were available during my care of the patient were reviewed by me and considered in my medical decision making (see chart for details).    MDM Rules/Calculators/A&P                           35 yo F with a chief complaints of cough congestion shortness of breath wheezes left ear pain.  Most likely the patient has a viral syndrome.  Otitis media is difficult to exclude based on exam.  Will start on antibiotics.  Patient requesting an inhaler.  We will give a dose of Decadron here.  Patient had a very thorough work-up ordered in triage.  Troponin negative no electrolyte abnormality no significant anemia chest x-ray viewed by me without focal infiltrate EKG without concerning finding.  PCP follow-up.  3:38 PM:  I have discussed the diagnosis/risks/treatment options with the patient and believe the pt to be eligible for discharge home to follow-up with PCP. We also discussed returning to the ED  immediately if new or worsening sx occur. We discussed the sx which are most concerning (e.g., sudden worsening pain, fever, inability to tolerate by mouth) that necessitate immediate return. Medications administered to the patient during their visit and any new prescriptions provided to the patient are listed below.  Medications given during this visit Medications  amoxicillin (AMOXIL) capsule 1,000 mg (1,000 mg Oral Given 03/17/21 1535)  dexamethasone (DECADRON) tablet 10 mg (10 mg Oral Given 03/17/21 1535)  albuterol (VENTOLIN HFA) 108 (90 Base) MCG/ACT inhaler 2 puff (2 puffs Inhalation Given 03/17/21 1536)     The patient appears reasonably screen and/or stabilized for discharge and I doubt any other medical condition or other [facility] requiring further screening, evaluation, or treatment in the ED at this time prior to discharge.   Final Clinical Impression(s) / ED Diagnoses Final diagnoses:  Viral upper respiratory tract infection  Recurrent acute suppurative otitis media without spontaneous rupture of left tympanic membrane    Rx / DC Orders ED Discharge Orders          Ordered    amoxicillin (AMOXIL) 500 MG tablet  2 times daily        03/17/21 1530    benzonatate (TESSALON) 100 MG capsule  Every 8 hours        03/17/21 1530    ondansetron (ZOFRAN ODT) 4 MG disintegrating tablet        03/17/21 [place], [place], DO 03/17/21 1538

## 2021-03-28 ENCOUNTER — Other Ambulatory Visit: Payer: Self-pay

## 2021-03-28 ENCOUNTER — Emergency Department (HOSPITAL_COMMUNITY)
Admission: EM | Admit: 2021-03-28 | Discharge: 2021-03-28 | Disposition: A | Discharge status: Home / Self Care | Payer: 59 | Attending: Emergency Medicine | Admitting: Emergency Medicine

## 2021-03-28 ENCOUNTER — Encounter (HOSPITAL_COMMUNITY): Payer: Self-pay | Admitting: Emergency Medicine

## 2021-03-28 DIAGNOSIS — J101 Influenza due to other identified influenza virus with other respiratory manifestations: Secondary | ICD-10-CM | POA: Insufficient documentation

## 2021-03-28 DIAGNOSIS — I10 Essential (primary) hypertension: Secondary | ICD-10-CM | POA: Diagnosis not present

## 2021-03-28 DIAGNOSIS — Z20822 Contact with and (suspected) exposure to covid-19: Secondary | ICD-10-CM | POA: Insufficient documentation

## 2021-03-28 DIAGNOSIS — R059 Cough, unspecified: Secondary | ICD-10-CM | POA: Diagnosis not present

## 2021-03-28 DIAGNOSIS — Z87891 Personal history of nicotine dependence: Secondary | ICD-10-CM | POA: Diagnosis not present

## 2021-03-28 DIAGNOSIS — R000 Tachycardia, unspecified: Secondary | ICD-10-CM | POA: Insufficient documentation

## 2021-03-28 LAB — RESP PANEL BY RT-PCR (FLU A&B, COVID) ARPGX2
Influenza A by PCR: POSITIVE — AB
Influenza B by PCR: NEGATIVE
SARS Coronavirus 2 by RT PCR: NEGATIVE

## 2021-03-28 MED ORDER — BENZONATATE 100 MG PO CAPS: 100.0000 mg | ORAL_CAPSULE | Freq: Three times a day (TID) | ORAL | 0 refills | Status: DC | PRN

## 2021-03-28 MED ORDER — OSELTAMIVIR PHOSPHATE 75 MG PO CAPS: 75.0000 mg | ORAL_CAPSULE | Freq: Two times a day (BID) | ORAL | 0 refills | Status: DC

## 2021-03-28 MED ORDER — ACETAMINOPHEN 325 MG PO TABS
650.0000 mg | ORAL_TABLET | Freq: Four times a day (QID) | ORAL | Status: DC | PRN
  Administered 2021-03-28: 650 mg via ORAL
  Filled 2021-03-28: qty 2

## 2021-03-28 NOTE — ED Triage Notes (Signed)
C/o productive cough with green phlegm since Thursday, generalized weakness, body aches, fever, and chills that have been worse since yesterday.

## 2021-03-28 NOTE — ED Provider Notes (Signed)
[facility] EMERGENCY DEPARTMENT Provider Note   CSN: [id number] Arrival date & time: 03/28/21  1419     History No chief complaint on file.   Jessica Sanchez is a 35 y.o. female.  HPI Patient is a 35 year old female with a medical history as noted below.  She presents to the emergency department due to a cough for the past 2 days.  States that it has been productive with green sputum.  Reports associated fatigue, body aches, fevers, chills.  No nausea, vomiting, or diarrhea.  No abdominal pain.  States she has been vaccinated for COVID-19 x2 and denies any previous known COVID-19 infections.  She has not gotten the flu shot this year.    Past Medical History:  Diagnosis Date   Arthritis    right shoulder   Dyspnea    Dysrhythmia    hx of SVT   Fibroid (bleeding) (uterine)    Fibroid, uterine    H/O metrorrhagia    Hypertension    IUD (intrauterine device) in place 07/11/2015   Menorrhagia    Obesity, Class III, BMI 40-49.9 (morbid obesity) ([place]) 11/11/2018   Sleep apnea    uses CPAP - setting 11   SVT (supraventricular tachycardia) ([place])     Patient Active Problem List   Diagnosis Date Noted   Encounter for IUD removal    Menorrhagia 09/04/2020   Elevated troponin    Attempted IUD removal, unsuccessful 07/05/2019   Encounter for IUD insertion 07/05/2019   Gastroesophageal reflux disease    Nonspecific chest pain 11/11/2018   Morbid obesity with body mass index (BMI) of 60.0 to 69.9 in adult [facility]) 11/11/2018   Elevated d-dimer 11/11/2018   SVT (supraventricular tachycardia) ([place]) 10/04/2018   IUD (intrauterine device) in place 07/11/2015   Other disorder of menstruation and other abnormal bleeding from female genital tract 10/30/2012    Past Surgical History:  Procedure Laterality Date   HYSTEROSCOPY N/A 09/17/2020   Procedure: HYSTEROSCOPY;  Surgeon: [name], MD;  Location: [facility];  Service: Gynecology;  Laterality: N/A;   HYSTEROSCOPY  WITH D & C N/A 09/27/2013   Procedure: DILATATION AND CURETTAGE /HYSTEROSCOPY and insertion of Mirena IUD ;  Surgeon: [name]. Harrington Challenger, MD;  Location: [facility];  Service: Gynecology;  Laterality: N/A;   IUD REMOVAL N/A 09/17/2020   Procedure: INTRAUTERINE DEVICE (IUD) REMOVAL (2);  Surgeon: [name], MD;  Location: [facility];  Service: Gynecology;  Laterality: N/A;   MYOMECTOMY  02/03/2011   Procedure: MYOMECTOMY;  Surgeon: [name], MD;  Location: [facility];  Service: Gynecology;  Laterality: N/A;   SVT ABLATION N/A 11/08/2018   Procedure: SVT ABLATION;  Surgeon: [name], MD;  Location: [facility];  Service: Cardiovascular;  Laterality: N/A;   SVT ABLATION N/A 12/26/2020   Procedure: SVT ABLATION;  Surgeon: [name], MD;  Location: [facility];  Service: Cardiovascular;  Laterality: N/A;   TONSILLECTOMY       OB History     Gravida  1   Para      Term      Preterm      AB  1   Living         SAB  1   IAB      Ectopic      Multiple      Live Births              Family History  Problem Relation Age of Onset   Cirrhosis Mother    Diabetes Mother    Cancer Maternal Grandfather    Hypertension Sister    Diabetes Sister    Anesthesia problems Neg Hx    Hypotension Neg Hx    Malignant hyperthermia Neg Hx    Pseudochol deficiency Neg Hx     Social History   Tobacco Use   Smoking status: Former    Types: Cigarettes   Smokeless tobacco: Never   Tobacco comments:    socially  Media planner   Vaping Use: Never used  Substance Use Topics   Alcohol use: Yes    Comment: occasional   Drug use: No    Home Medications Prior to Admission medications   Medication Sig Start Date End Date Taking? Authorizing Provider  benzonatate (TESSALON) 100 MG capsule Take 1 capsule (100 mg total) by mouth 3 (three) times daily as needed for cough. 03/28/21  Yes Rayna Sexton, PA-C  oseltamivir (TAMIFLU) 75 MG capsule Take 1 capsule (75 mg total) by  mouth every 12 (twelve) hours. 03/28/21  Yes Rayna Sexton, PA-C  acetaminophen (TYLENOL) 325 MG tablet Take 650 mg by mouth every 6 (six) hours as needed for headache.    [provider]  albuterol (PROVENTIL HFA) 108 (90 Base) MCG/ACT inhaler Inhale 1-2 puffs into the lungs every 6 (six) hours as needed for wheezing or shortness of breath.     [provider]  flecainide (TAMBOCOR) 100 MG tablet Take 1 tablet (100 mg total) by mouth 2 (two) times daily. 11/13/20   [name], MD  Multiple Vitamins-Calcium (ONE-A-DAY WOMENS PO) Take 1 tablet by mouth daily.    [provider]  ondansetron (ZOFRAN ODT) 4 MG disintegrating tablet 4mg  ODT q4 hours prn nausea/vomit 03/17/21   Deno Etienne, DO  apixaban (ELIQUIS) 5 MG TABS tablet Take 2 tablets (10mg ) twice daily for 7 days, then 1 tablet (5mg ) twice daily 04/20/19 04/20/19  Rodell Perna A, PA-C  levonorgestrel (MIRENA) 20 MCG/24HR IUD 1 each by Intrauterine route once.   06/25/20  [provider]    Allergies    Shellfish allergy, Sulfa antibiotics, and Adhesive [tape]  Review of Systems   Review of Systems  Constitutional:  Positive for activity change, chills, fatigue and fever.  HENT:  Positive for congestion, rhinorrhea and sore throat.   Respiratory:  Positive for cough and shortness of breath.   Cardiovascular:  Positive for chest pain (chest irritation when coughing).  Gastrointestinal:  Negative for abdominal pain, diarrhea, nausea and vomiting.   Physical Exam Updated Vital Signs BP 112/71   Pulse 99   Temp 99.7 F (37.6 C) (Oral)   Resp 20   LMP 02/25/2021   SpO2 96%   Physical Exam Vitals and nursing note reviewed.  Constitutional:      General: She is not in acute distress.    Appearance: Normal appearance. She is not ill-appearing, toxic-appearing or diaphoretic.  HENT:     Head: Normocephalic and atraumatic.     Right Ear: External ear normal.     Left Ear: External ear normal.      Nose: Nose normal.     Mouth/Throat:     Mouth: Mucous membranes are moist.     Pharynx: Oropharynx is clear. No oropharyngeal exudate or posterior oropharyngeal erythema.  Eyes:     Extraocular Movements: Extraocular movements intact.  Cardiovascular:     Rate and Rhythm: Regular rhythm. Tachycardia present.  Pulses: Normal pulses.     Heart sounds: Normal heart sounds. No murmur heard.   No friction rub. No gallop.  Pulmonary:     Effort: Pulmonary effort is normal. No respiratory distress.     Breath sounds: Normal breath sounds. No stridor. No wheezing, rhonchi or rales.  Abdominal:     General: Abdomen is flat.     Palpations: Abdomen is soft.     Tenderness: There is no abdominal tenderness.     Comments: Protuberant abdomen that is soft and nontender.  Musculoskeletal:        General: Normal range of motion.     Cervical back: Normal range of motion and neck supple. No tenderness.  Skin:    General: Skin is warm and dry.  Neurological:     General: No focal deficit present.     Mental Status: She is alert and oriented to person, place, and time.  Psychiatric:        Mood and Affect: Mood normal.        Behavior: Behavior normal.    ED Results / Procedures / Treatments   Labs (all labs ordered are listed, but only abnormal results are displayed) Labs Reviewed  RESP PANEL BY RT-PCR (FLU A&B, COVID) ARPGX2 - Abnormal; Notable for the following components:      Result Value   Influenza A by PCR POSITIVE (*)    All other components within normal limits   EKG None  Radiology No results found.  Procedures Procedures   Medications Ordered in ED Medications - No data to display   ED Course  I have reviewed the triage vital signs and the nursing notes.  Pertinent labs & imaging results that were available during my care of the patient were reviewed by me and considered in my medical decision making (see chart for details).  Clinical Course as of  03/29/21 1522  Sat Mar 28, 2021  1739 Influenza A By PCR(!): POSITIVE [LJ]    Clinical Course User Index [LJ] Rayna Sexton, PA-C   MDM Rules/Calculators/A&P                          Pt is a 35 y.o. nontoxic-appearing female who presents to the emergency department due to a productive cough, fatigue, body aches, fevers, chills.  Labs: Respiratory panel is positive for influenza A.  I, Rayna Sexton, PA-C, personally reviewed and evaluated these images and lab results as part of my medical decision-making.  Patient initially tachycardic and febrile.  Patient given a dose of Tylenol with improvement in her vital signs.  Patient found to be positive for influenza A.  Will treat with Tamiflu as well as Tessalon Perles.  Feel the patient is stable for discharge at this time and she is agreeable.  We discussed return precautions.  Her questions were answered and she was amicable at the time of discharge.  Note: Portions of this report may have been transcribed using voice recognition software. Every effort was made to ensure accuracy; however, inadvertent computerized transcription errors may be present.   Final Clinical Impression(s) / ED Diagnoses Final diagnoses:  Influenza A   Rx / DC Orders ED Discharge Orders          Ordered    oseltamivir (TAMIFLU) 75 MG capsule  Every 12 hours        03/28/21 1829    benzonatate (TESSALON) 100 MG capsule  3 times daily PRN  03/28/21 1829             Rayna Sexton, PA-C 03/29/21 1525    Malvin Johns, MD 03/29/21 1616

## 2021-03-28 NOTE — Discharge Instructions (Signed)
I prescribed you 2 medications.  The first medication is called Tamiflu.  This is an antiviral medication that you can take for your flu diagnosis.  Please take this twice a day for the next 5 days.  The second medication is called Tessalon Perles.  This is a cough medication you can take up to 3 times per day.  Please only take it as prescribed.  If you develop any new or worsening symptoms please come back to the emergency department.  It was a pleasure to meet you.

## 2021-04-14 ENCOUNTER — Telehealth: Payer: Self-pay

## 2021-04-14 NOTE — Telephone Encounter (Signed)
Letter has been sent to patient instructing them to call us if they are still interested in completing their sleep study. If we have not received a response from the patient within 30 days of this notice, the order will be cancelled and they will need to discuss the need for a sleep study at their next office visit.  ° °

## 2021-05-08 NOTE — Telephone Encounter (Signed)
Letter has been sent to patient instructing them to call us if they are still interested in completing their sleep study.

## 2021-05-21 ENCOUNTER — Encounter (HOSPITAL_COMMUNITY): Payer: Self-pay

## 2021-05-21 ENCOUNTER — Other Ambulatory Visit: Payer: Self-pay

## 2021-05-21 ENCOUNTER — Ambulatory Visit (HOSPITAL_COMMUNITY)
Admission: EM | Admit: 2021-05-21 | Discharge: 2021-05-21 | Disposition: A | Discharge status: Home / Self Care | Payer: 59 | Attending: Emergency Medicine | Admitting: Emergency Medicine

## 2021-05-21 DIAGNOSIS — H6092 Unspecified otitis externa, left ear: Secondary | ICD-10-CM

## 2021-05-21 MED ORDER — NEOMYCIN-POLYMYXIN-HC 3.5-10000-1 OT SUSP: 4.0000 [drp] | Freq: Three times a day (TID) | OTIC | 0 refills | Status: DC

## 2021-05-21 MED ORDER — AMOXICILLIN-POT CLAVULANATE 875-125 MG PO TABS: 1.0000 | ORAL_TABLET | Freq: Two times a day (BID) | ORAL | 0 refills | Status: DC

## 2021-05-21 NOTE — ED Triage Notes (Signed)
Pt c/o lt ear pain x3 days and a sore throat since yesterday. Took mucinex and tylenol with no relief. States will need a work note.

## 2021-05-21 NOTE — Discharge Instructions (Addendum)
Follow up next week to have your ear and skin around your ear rechecked to make sure it is healing.   I have listed local ENT practices below as requested.

## 2021-05-22 NOTE — ED Provider Notes (Signed)
[place]    CSN: [id number] Arrival date & time: 05/21/21  1857      History   Chief Complaint Chief Complaint  Patient presents with   Otalgia    HPI Jessica Sanchez is a 36 y.o. female. Pt c/o lt ear pain x3 days and a sore throat since yesterday. Took mucinex and tylenol with no relief. Hx recurrent ear infections as a child and had tubes, hx recurrent throat infections as a child and had tonsils removed.    Otalgia Associated symptoms: sore throat   Associated symptoms: no congestion, no cough, no ear discharge and no fever    Past Medical History:  Diagnosis Date   Arthritis    right shoulder   Dyspnea    Dysrhythmia    hx of SVT   Fibroid (bleeding) (uterine)    Fibroid, uterine    H/O metrorrhagia    Hypertension    IUD (intrauterine device) in place 07/11/2015   Menorrhagia    Obesity, Class III, BMI 40-49.9 (morbid obesity) ([place]) 11/11/2018   Sleep apnea    uses CPAP - setting 11   SVT (supraventricular tachycardia) ([place])     Patient Active Problem List   Diagnosis Date Noted   Encounter for IUD removal    Menorrhagia 09/04/2020   Elevated troponin    Attempted IUD removal, unsuccessful 07/05/2019   Encounter for IUD insertion 07/05/2019   Gastroesophageal reflux disease    Nonspecific chest pain 11/11/2018   Morbid obesity with body mass index (BMI) of 60.0 to 69.9 in adult ([place]) 11/11/2018   Elevated d-dimer 11/11/2018   SVT (supraventricular tachycardia) ([place]) 10/04/2018   IUD (intrauterine device) in place 07/11/2015   Other disorder of menstruation and other abnormal bleeding from female genital tract 10/30/2012    Past Surgical History:  Procedure Laterality Date   HYSTEROSCOPY N/A 09/17/2020   Procedure: HYSTEROSCOPY;  Surgeon: [name], MD;  Location: [facility];  Service: Gynecology;  Laterality: N/A;   HYSTEROSCOPY WITH D & C N/A 09/27/2013   Procedure: DILATATION AND CURETTAGE /HYSTEROSCOPY and insertion of Mirena IUD ;   Surgeon: [name]. Harrington Challenger, MD;  Location: [facility];  Service: Gynecology;  Laterality: N/A;   IUD REMOVAL N/A 09/17/2020   Procedure: INTRAUTERINE DEVICE (IUD) REMOVAL (2);  Surgeon: [name], MD;  Location: [facility];  Service: Gynecology;  Laterality: N/A;   MYOMECTOMY  02/03/2011   Procedure: MYOMECTOMY;  Surgeon: [name], MD;  Location: [facility];  Service: Gynecology;  Laterality: N/A;   SVT ABLATION N/A 11/08/2018   Procedure: SVT ABLATION;  Surgeon: [name], MD;  Location: [facility];  Service: Cardiovascular;  Laterality: N/A;   SVT ABLATION N/A 12/26/2020   Procedure: SVT ABLATION;  Surgeon: [name], MD;  Location: [facility];  Service: Cardiovascular;  Laterality: N/A;   TONSILLECTOMY      OB History     Gravida  1   Para      Term      Preterm      AB  1   Living         SAB  1   IAB      Ectopic      Multiple      Live Births               Home Medications    Prior to Admission medications   Medication Sig Start Date End  Date Taking? Authorizing Provider  amoxicillin-clavulanate (AUGMENTIN) 875-125 MG tablet Take 1 tablet by mouth every 12 (twelve) hours. 05/21/21  Yes Carvel Getting, NP  neomycin-polymyxin-hydrocortisone (CORTISPORIN) 3.5-10000-1 OTIC suspension Place 4 drops into the left ear 3 (three) times daily. 05/21/21  Yes Carvel Getting, NP  acetaminophen (TYLENOL) 325 MG tablet Take 650 mg by mouth every 6 (six) hours as needed for headache.    [provider]  albuterol (PROVENTIL HFA) 108 (90 Base) MCG/ACT inhaler Inhale 1-2 puffs into the lungs every 6 (six) hours as needed for wheezing or shortness of breath.     [provider]  Multiple Vitamins-Calcium (ONE-A-DAY WOMENS PO) Take 1 tablet by mouth daily.    [provider]  ondansetron (ZOFRAN ODT) 4 MG disintegrating tablet 4mg  ODT q4 hours prn nausea/vomit 03/17/21   Deno Etienne, DO  apixaban (ELIQUIS) 5 MG TABS tablet Take 2  tablets (10mg ) twice daily for 7 days, then 1 tablet (5mg ) twice daily 04/20/19 04/20/19  Rodell Perna A, PA-C  levonorgestrel (MIRENA) 20 MCG/24HR IUD 1 each by Intrauterine route once.   06/25/20  [provider]    Family History Family History  Problem Relation Age of Onset   Cirrhosis Mother    Diabetes Mother    Cancer Maternal Grandfather    Hypertension Sister    Diabetes Sister    Anesthesia problems Neg Hx    Hypotension Neg Hx    Malignant hyperthermia Neg Hx    Pseudochol deficiency Neg Hx     Social History Social History   Tobacco Use   Smoking status: Former    Types: Cigarettes   Smokeless tobacco: Never   Tobacco comments:    socially  Media planner   Vaping Use: Never used  Substance Use Topics   Alcohol use: Yes    Comment: occasional   Drug use: No     Allergies   Shellfish allergy, Sulfa antibiotics, and Adhesive [tape]   Review of Systems Review of Systems  Constitutional:  Negative for chills and fever.  HENT:  Positive for ear pain and sore throat. Negative for congestion, ear discharge and postnasal drip.   Respiratory:  Negative for cough.     Physical Exam Triage Vital Signs ED Triage Vitals  Enc Vitals Group     BP 05/21/21 1913 136/83     Pulse Rate 05/21/21 1913 84     Resp 05/21/21 1913 18     Temp 05/21/21 1913 98.2 F (36.8 C)     Temp Source 05/21/21 1913 Oral     SpO2 05/21/21 1913 96 %     Weight --      Height --      Head Circumference --      Peak Flow --      Pain Score 05/21/21 1914 10     Pain Loc --      Pain Edu? --      Excl. in [place]? --    No data found.  Updated Vital Signs BP 136/83 (BP Location: Left Arm)    Pulse 84    Temp 98.2 F (36.8 C) (Oral)    Resp 18    LMP 04/30/2021    SpO2 96%   Visual Acuity Right Eye Distance:   Left Eye Distance:   Bilateral Distance:    Right Eye Near:   Left Eye Near:    Bilateral Near:     Physical Exam Constitutional:  General: She is not in  acute distress.    Appearance: Normal appearance. She is not ill-appearing.  HENT:     Right Ear: Ear canal and external ear normal. Tympanic membrane is scarred.     Left Ear: Ear canal normal. Tenderness present. There is mastoid tenderness. Tympanic membrane is scarred. Tympanic membrane is not injected.  Cardiovascular:     Rate and Rhythm: Normal rate and regular rhythm.  Pulmonary:     Effort: Pulmonary effort is normal.     Breath sounds: Normal breath sounds.  Neurological:     Mental Status: She is alert.     UC Treatments / Results  Labs (all labs ordered are listed, but only abnormal results are displayed) Labs Reviewed - No data to display  EKG   Radiology No results found.  Procedures Procedures (including critical care time)  Medications Ordered in UC Medications - No data to display  Initial Impression / Assessment and Plan / UC Course  I have reviewed the triage vital signs and the nursing notes.  Pertinent labs & imaging results that were available during my care of the patient were reviewed by me and considered in my medical decision making (see chart for details).    Concern for mastoiditis plus otitis externa, despite lack of ear discharge. Will tx with oral antibioitc and antibioitc eardrops. Needs rechecked next week.   Final Clinical Impressions(s) / UC Diagnoses   Final diagnoses:  Otitis externa of left ear, unspecified chronicity, unspecified type     Discharge Instructions      Follow up next week to have your ear and skin around your ear rechecked to make sure it is healing.   I have listed local ENT practices below as requested.    ED Prescriptions     Medication Sig Dispense Auth. Provider   neomycin-polymyxin-hydrocortisone (CORTISPORIN) 3.5-10000-1 OTIC suspension Place 4 drops into the left ear 3 (three) times daily. 10 mL Carvel Getting, NP   amoxicillin-clavulanate (AUGMENTIN) 875-125 MG tablet Take 1 tablet by mouth every  12 (twelve) hours. 14 tablet Carvel Getting, NP      PDMP not reviewed this encounter.   Carvel Getting, NP 05/22/21 [phone]

## 2021-05-28 ENCOUNTER — Telehealth: Payer: Self-pay | Admitting: *Deleted

## 2021-05-28 NOTE — Telephone Encounter (Signed)
-----   Message from Sueanne Margarita, MD sent at 02/07/2021 11:08 PM EDT ----- Regarding: RE: CPAP Follow-up I need a download on current device to see if we need to get an udated sleep study ----- Message ----- From: Freada Bergeron, CMA Sent: 02/06/2021  10:47 AM EDT To: Sueanne Margarita, MD Subject: FW: CPAP Follow-up                             Please advise if she should start over with a Home sleep Test. She states she is still using her cpap as much as she can. Thanks, Gae Bon ----- Message ----- From: Erma Heritage, PA-C Sent: 12/31/2020   5:12 PM EDT To: Freada Bergeron, CMA Subject: CPAP Follow-up                                 Good afternoon,  I saw this patient in follow-up today and she reported not using her CPAP routinely due to the mask not fitting properly and due to having an older machine as her prior insurance did not cover a new machine. 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 I was hoping you might be able to help with this as it appears you previously communicated with her about this in 2021 by review of telephone encounters.  Thanks for your help!  [place], [place]

## 2021-05-28 NOTE — Telephone Encounter (Signed)
Reached out to patient left message to call back.

## 2021-06-05 ENCOUNTER — Ambulatory Visit (HOSPITAL_COMMUNITY)
Admission: EM | Admit: 2021-06-05 | Discharge: 2021-06-05 | Disposition: A | Discharge status: Home / Self Care | Payer: 59 | Attending: Internal Medicine | Admitting: Internal Medicine

## 2021-06-05 ENCOUNTER — Encounter (HOSPITAL_COMMUNITY): Payer: Self-pay | Admitting: Emergency Medicine

## 2021-06-05 ENCOUNTER — Other Ambulatory Visit: Payer: Self-pay

## 2021-06-05 DIAGNOSIS — H9202 Otalgia, left ear: Secondary | ICD-10-CM | POA: Insufficient documentation

## 2021-06-05 DIAGNOSIS — J069 Acute upper respiratory infection, unspecified: Secondary | ICD-10-CM

## 2021-06-05 DIAGNOSIS — R059 Cough, unspecified: Secondary | ICD-10-CM | POA: Insufficient documentation

## 2021-06-05 DIAGNOSIS — Z20822 Contact with and (suspected) exposure to covid-19: Secondary | ICD-10-CM | POA: Insufficient documentation

## 2021-06-05 MED ORDER — PREDNISONE 20 MG PO TABS: 20.0000 mg | ORAL_TABLET | Freq: Every day | ORAL | 0 refills | Status: AC

## 2021-06-05 MED ORDER — IBUPROFEN 600 MG PO TABS: 600.0000 mg | ORAL_TABLET | Freq: Four times a day (QID) | ORAL | 0 refills | Status: DC | PRN

## 2021-06-05 MED ORDER — FLUTICASONE PROPIONATE 50 MCG/ACT NA SUSP: 1.0000 | Freq: Every day | NASAL | 0 refills | Status: DC

## 2021-06-05 MED ORDER — BENZONATATE 200 MG PO CAPS: 200.0000 mg | ORAL_CAPSULE | Freq: Three times a day (TID) | ORAL | 0 refills | Status: DC | PRN

## 2021-06-05 NOTE — ED Triage Notes (Signed)
Pt c/o cough that has gotten progressively worse over night and left ear pain x 1 week.

## 2021-06-05 NOTE — Discharge Instructions (Addendum)
Humidifier use will help with nasal congestion Vapor rub use will help with nasal congestion and cough Take medications as prescribed Return to urgent care if symptoms worsen We will call you with recommendations if labs are abnormal.

## 2021-06-06 LAB — SARS CORONAVIRUS 2 (TAT 6-24 HRS): SARS Coronavirus 2: NEGATIVE

## 2021-06-10 NOTE — ED Provider Notes (Signed)
[place]    CSN: [id number] Arrival date & time: 06/05/21  1613      History   Chief Complaint Chief Complaint  Patient presents with   URI    HPI Jessica Sanchez is a 36 y.o. female to urgent care with 4-week history of nonproductive cough.  Cough started insidiously and has gotten progressively worse.  Cough is particularly worse at night.  She started experiencing left ear pain over the past couple of days since her visit to the urgent care.  She denies any shortness of breath or wheezing.  Her cough is now productive of sputum.  No chest pain or chest pressure.  No generalized body aches.  No wheezing.  Patient complains of nasal congestion with postnasal drainage.  No ear discharge.  HPI  Past Medical History:  Diagnosis Date   Arthritis    right shoulder   Dyspnea    Dysrhythmia    hx of SVT   Fibroid (bleeding) (uterine)    Fibroid, uterine    H/O metrorrhagia    Hypertension    IUD (intrauterine device) in place 07/11/2015   Menorrhagia    Obesity, Class III, BMI 40-49.9 (morbid obesity) ([place]) 11/11/2018   Sleep apnea    uses CPAP - setting 11   SVT (supraventricular tachycardia) ([place])     Patient Active Problem List   Diagnosis Date Noted   Encounter for IUD removal    Menorrhagia 09/04/2020   Elevated troponin    Attempted IUD removal, unsuccessful 07/05/2019   Encounter for IUD insertion 07/05/2019   Gastroesophageal reflux disease    Nonspecific chest pain 11/11/2018   Morbid obesity with body mass index (BMI) of 60.0 to 69.9 in adult [facility]) 11/11/2018   Elevated d-dimer 11/11/2018   SVT (supraventricular tachycardia) ([place]) 10/04/2018   IUD (intrauterine device) in place 07/11/2015   Other disorder of menstruation and other abnormal bleeding from female genital tract 10/30/2012    Past Surgical History:  Procedure Laterality Date   HYSTEROSCOPY N/A 09/17/2020   Procedure: HYSTEROSCOPY;  Surgeon: [name], MD;  Location: [facility];   Service: Gynecology;  Laterality: N/A;   HYSTEROSCOPY WITH D & C N/A 09/27/2013   Procedure: DILATATION AND CURETTAGE /HYSTEROSCOPY and insertion of Mirena IUD ;  Surgeon: [name]. Harrington Challenger, MD;  Location: [facility];  Service: Gynecology;  Laterality: N/A;   IUD REMOVAL N/A 09/17/2020   Procedure: INTRAUTERINE DEVICE (IUD) REMOVAL (2);  Surgeon: [name], MD;  Location: [facility];  Service: Gynecology;  Laterality: N/A;   MYOMECTOMY  02/03/2011   Procedure: MYOMECTOMY;  Surgeon: [name], MD;  Location: [facility];  Service: Gynecology;  Laterality: N/A;   SVT ABLATION N/A 11/08/2018   Procedure: SVT ABLATION;  Surgeon: [name], MD;  Location: [facility];  Service: Cardiovascular;  Laterality: N/A;   SVT ABLATION N/A 12/26/2020   Procedure: SVT ABLATION;  Surgeon: [name], MD;  Location: [facility];  Service: Cardiovascular;  Laterality: N/A;   TONSILLECTOMY      OB History     Gravida  1   Para      Term      Preterm      AB  1   Living         SAB  1   IAB      Ectopic      Multiple      Live Births  Home Medications    Prior to Admission medications   Medication Sig Start Date End Date Taking? Authorizing Provider  benzonatate (TESSALON) 200 MG capsule Take 1 capsule (200 mg total) by mouth 3 (three) times daily as needed for cough. 06/05/21  Yes Karlie Aung, Myrene Galas, MD  fluticasone (FLONASE) 50 MCG/ACT nasal spray Place 1 spray into both nostrils daily. 06/05/21  Yes Donatello Kleve, Myrene Galas, MD  ibuprofen (ADVIL) 600 MG tablet Take 1 tablet (600 mg total) by mouth every 6 (six) hours as needed. 06/05/21  Yes Shaia Porath, Myrene Galas, MD  predniSONE (DELTASONE) 20 MG tablet Take 1 tablet (20 mg total) by mouth daily for 5 days. 06/05/21 06/10/21 Yes Maciah Schweigert, Myrene Galas, MD  acetaminophen (TYLENOL) 325 MG tablet Take 650 mg by mouth every 6 (six) hours as needed for headache.    [provider]  albuterol (PROVENTIL HFA) 108 (90 Base)  MCG/ACT inhaler Inhale 1-2 puffs into the lungs every 6 (six) hours as needed for wheezing or shortness of breath.     [provider]  amoxicillin-clavulanate (AUGMENTIN) 875-125 MG tablet Take 1 tablet by mouth every 12 (twelve) hours. 05/21/21   Carvel Getting, NP  Multiple Vitamins-Calcium (ONE-A-DAY WOMENS PO) Take 1 tablet by mouth daily.    [provider]  neomycin-polymyxin-hydrocortisone (CORTISPORIN) 3.5-10000-1 OTIC suspension Place 4 drops into the left ear 3 (three) times daily. 05/21/21   Carvel Getting, NP  ondansetron (ZOFRAN ODT) 4 MG disintegrating tablet 4mg  ODT q4 hours prn nausea/vomit 03/17/21   Deno Etienne, DO  apixaban (ELIQUIS) 5 MG TABS tablet Take 2 tablets (10mg ) twice daily for 7 days, then 1 tablet (5mg ) twice daily 04/20/19 04/20/19  Rodell Perna A, PA-C  levonorgestrel (MIRENA) 20 MCG/24HR IUD 1 each by Intrauterine route once.   06/25/20  [provider]    Family History Family History  Problem Relation Age of Onset   Cirrhosis Mother    Diabetes Mother    Cancer Maternal Grandfather    Hypertension Sister    Diabetes Sister    Anesthesia problems Neg Hx    Hypotension Neg Hx    Malignant hyperthermia Neg Hx    Pseudochol deficiency Neg Hx     Social History Social History   Tobacco Use   Smoking status: Former    Types: Cigarettes   Smokeless tobacco: Never   Tobacco comments:    socially  Media planner   Vaping Use: Never used  Substance Use Topics   Alcohol use: Yes    Comment: occasional   Drug use: No     Allergies   Shellfish allergy, Sulfa antibiotics, and Adhesive [tape]   Review of Systems Review of Systems  HENT:  Positive for congestion, ear pain, postnasal drip and rhinorrhea.   Eyes: Negative.   Respiratory:  Positive for cough. Negative for chest tightness, shortness of breath and wheezing.   Genitourinary: Negative.   Neurological: Negative.     Physical Exam Triage Vital Signs ED Triage  Vitals  Enc Vitals Group     BP 06/05/21 1652 (!) 130/101     Pulse Rate 06/05/21 1652 91     Resp 06/05/21 1652 20     Temp 06/05/21 1652 98.7 F (37.1 C)     Temp Source 06/05/21 1652 Oral     SpO2 06/05/21 1649 100 %     Weight 06/05/21 1649 (!) 332 lb 14.3 oz (151 kg)     Height 06/05/21 1649 5\' 3"  (1.6  m)     Head Circumference --      Peak Flow --      Pain Score 06/05/21 1649 10     Pain Loc --      Pain Edu? --      Excl. in [place]? --    No data found.  Updated Vital Signs BP (!) 130/101 (BP Location: Left Arm)    Pulse 91    Temp 98.7 F (37.1 C) (Oral)    Resp 20    Ht 5\' 3"  (1.6 m)    Wt (!) 151 kg    SpO2 100%    BMI 58.97 kg/m   Visual Acuity Right Eye Distance:   Left Eye Distance:   Bilateral Distance:    Right Eye Near:   Left Eye Near:    Bilateral Near:     Physical Exam Vitals and nursing note reviewed.  Constitutional:      General: She is not in acute distress.    Appearance: She is not ill-appearing.  HENT:     Right Ear: Tympanic membrane normal.     Left Ear: Tympanic membrane normal.  Cardiovascular:     Rate and Rhythm: Normal rate and regular rhythm.     Pulses: Normal pulses.     Heart sounds: Normal heart sounds.  Pulmonary:     Effort: Pulmonary effort is normal.     Breath sounds: Normal breath sounds.  Neurological:     Mental Status: She is alert.     UC Treatments / Results  Labs (all labs ordered are listed, but only abnormal results are displayed) Labs Reviewed  SARS CORONAVIRUS 2 (TAT 6-24 HRS)    EKG   Radiology No results found.  Procedures Procedures (including critical care time)  Medications Ordered in UC Medications - No data to display  Initial Impression / Assessment and Plan / UC Course  I have reviewed the triage vital signs and the nursing notes.  Pertinent labs & imaging results that were available during my care of the patient were reviewed by me and considered in my medical decision making (see  chart for details).     1.  Viral URI with cough: Short course of prednisone Tessalon Perles as needed for cough Fluticasone nasal spray for nasal congestion and postnasal drainage Ibuprofen as needed for pain/fever Maintain adequate hydration Humidifier use and vapor use helps with symptoms. Return precautions given Final Clinical Impressions(s) / UC Diagnoses   Final diagnoses:  Viral URI with cough     Discharge Instructions      Humidifier use will help with nasal congestion Vapor rub use will help with nasal congestion and cough Take medications as prescribed Return to urgent care if symptoms worsen We will call you with recommendations if labs are abnormal.   ED Prescriptions     Medication Sig Dispense Auth. Provider   predniSONE (DELTASONE) 20 MG tablet Take 1 tablet (20 mg total) by mouth daily for 5 days. 5 tablet Arilla Hice, Myrene Galas, MD   benzonatate (TESSALON) 200 MG capsule Take 1 capsule (200 mg total) by mouth 3 (three) times daily as needed for cough. 30 capsule Emmaly Leech, Myrene Galas, MD   fluticasone (FLONASE) 50 MCG/ACT nasal spray Place 1 spray into both nostrils daily. 16 g Chase Picket, MD   ibuprofen (ADVIL) 600 MG tablet Take 1 tablet (600 mg total) by mouth every 6 (six) hours as needed. 30 tablet Shamiyah Ngu, Myrene Galas, MD  PDMP not reviewed this encounter.   Chase Picket, MD 06/10/21 [phone]

## 2021-07-03 ENCOUNTER — Telehealth: Payer: Self-pay | Admitting: *Deleted

## 2021-07-03 DIAGNOSIS — G4733 Obstructive sleep apnea (adult) (pediatric): Secondary | ICD-10-CM

## 2021-07-03 NOTE — Telephone Encounter (Signed)
Upon patient request DME selection is [facility] ?Patient understands he will be contacted by [facility] to set up his cpap. ?Patient understands to call if [facility] does not contact him with new setup in a timely manner. ?Patient understands they will be called once confirmation has been received from [facility] that they have received their new machine to schedule 10 week follow up appointment. ?  ?[facility] notified of new cpap order  ?Please add to airview ?Patient was grateful for the call and thanked me.  ?

## 2021-07-10 ENCOUNTER — Encounter: Payer: Self-pay | Admitting: Obstetrics & Gynecology

## 2021-07-10 ENCOUNTER — Other Ambulatory Visit: Payer: Self-pay

## 2021-07-10 ENCOUNTER — Other Ambulatory Visit (HOSPITAL_COMMUNITY)
Admission: RE | Admit: 2021-07-10 | Discharge: 2021-07-10 | Disposition: A | Discharge status: Home / Self Care | Payer: 59 | Source: Ambulatory Visit | Attending: Obstetrics & Gynecology | Admitting: Obstetrics & Gynecology

## 2021-07-10 ENCOUNTER — Ambulatory Visit: Payer: 59 | Admitting: Obstetrics & Gynecology

## 2021-07-10 VITALS — BP 138/93 | HR 81 | Ht 62.0 in | Wt 349.0 lb

## 2021-07-10 DIAGNOSIS — N939 Abnormal uterine and vaginal bleeding, unspecified: Secondary | ICD-10-CM | POA: Diagnosis not present

## 2021-07-10 DIAGNOSIS — Z124 Encounter for screening for malignant neoplasm of cervix: Secondary | ICD-10-CM

## 2021-07-10 LAB — POCT HEMOGLOBIN: Hemoglobin: 12.6 g/dL (ref 11–14.6)

## 2021-07-10 MED ORDER — MEGESTROL ACETATE 40 MG PO TABS: ORAL_TABLET | ORAL | 3 refills | Status: DC

## 2021-07-10 NOTE — Progress Notes (Unsigned)
Chief Complaint  Patient presents with   having heavy periods      36 y.o. G1P0010 Patient's last menstrual period was 07/05/2021. The current method of family planning is {contraception:315051}.  Outpatient Encounter Medications as of 07/10/2021  Medication Sig Note   acetaminophen (TYLENOL) 325 MG tablet Take 650 mg by mouth every 6 (six) hours as needed for headache.    albuterol (PROVENTIL HFA) 108 (90 Base) MCG/ACT inhaler Inhale 1-2 puffs into the lungs every 6 (six) hours as needed for wheezing or shortness of breath.  03/17/2021: Pt needs new prescirption   Multiple Vitamins-Calcium (ONE-A-DAY WOMENS PO) Take 1 tablet by mouth daily.    UNABLE TO FIND Med for heart-1.5 tabs daily    [DISCONTINUED] amoxicillin-clavulanate (AUGMENTIN) 875-125 MG tablet Take 1 tablet by mouth every 12 (twelve) hours.    [DISCONTINUED] apixaban (ELIQUIS) 5 MG TABS tablet Take 2 tablets ('10mg'$ ) twice daily for 7 days, then 1 tablet ('5mg'$ ) twice daily    [DISCONTINUED] benzonatate (TESSALON) 200 MG capsule Take 1 capsule (200 mg total) by mouth 3 (three) times daily as needed for cough.    [DISCONTINUED] fluticasone (FLONASE) 50 MCG/ACT nasal spray Place 1 spray into both nostrils daily.    [DISCONTINUED] ibuprofen (ADVIL) 600 MG tablet Take 1 tablet (600 mg total) by mouth every 6 (six) hours as needed.    [DISCONTINUED] levonorgestrel (MIRENA) 20 MCG/24HR IUD 1 each by Intrauterine route once.  02/13/2020: Patient thought that she may have 2 IUDs. The new one was inserted in May, 2020   [DISCONTINUED] neomycin-polymyxin-hydrocortisone (CORTISPORIN) 3.5-10000-1 OTIC suspension Place 4 drops into the left ear 3 (three) times daily.    [DISCONTINUED] ondansetron (ZOFRAN ODT) 4 MG disintegrating tablet '4mg'$  ODT q4 hours prn nausea/vomit    No facility-administered encounter medications on file as of 07/10/2021.    Subjective *** Past Medical History:  Diagnosis Date   Arthritis    right shoulder    Dyspnea    Dysrhythmia    hx of SVT   Fibroid (bleeding) (uterine)    Fibroid, uterine    H/O metrorrhagia    Hypertension    IUD (intrauterine device) in place 07/11/2015   Menorrhagia    Obesity, Class III, BMI 40-49.9 (morbid obesity) ([place]) 11/11/2018   Sleep apnea    uses CPAP - setting 11   SVT (supraventricular tachycardia) ([place])     Past Surgical History:  Procedure Laterality Date   HYSTEROSCOPY N/A 09/17/2020   Procedure: HYSTEROSCOPY;  Surgeon: [name], MD;  Location: [facility];  Service: Gynecology;  Laterality: N/A;   HYSTEROSCOPY WITH D & C N/A 09/27/2013   Procedure: DILATATION AND CURETTAGE /HYSTEROSCOPY and insertion of Mirena IUD ;  Surgeon: [name]. Harrington Challenger, MD;  Location: [facility];  Service: Gynecology;  Laterality: N/A;   IUD REMOVAL N/A 09/17/2020   Procedure: INTRAUTERINE DEVICE (IUD) REMOVAL (2);  Surgeon: [name], MD;  Location: [facility];  Service: Gynecology;  Laterality: N/A;   MYOMECTOMY  02/03/2011   Procedure: MYOMECTOMY;  Surgeon: [name], MD;  Location: [facility];  Service: Gynecology;  Laterality: N/A;   SVT ABLATION N/A 11/08/2018   Procedure: SVT ABLATION;  Surgeon: [name], MD;  Location: [facility];  Service: Cardiovascular;  Laterality: N/A;   SVT ABLATION N/A 12/26/2020   Procedure: SVT ABLATION;  Surgeon: [name], MD;  Location: [facility];  Service: Cardiovascular;  Laterality: N/A;   TONSILLECTOMY  OB History     Gravida  1   Para      Term      Preterm      AB  1   Living         SAB  1   IAB      Ectopic      Multiple      Live Births              Allergies  Allergen Reactions   Shellfish Allergy Shortness Of Breath, Itching and Swelling    Mouth became swollen, throat started itching, and developed shortness of breath   Sulfa Antibiotics Itching, Shortness Of Breath and Swelling    Mouth became swollen, throat started itching, and developed shortness of breath    Adhesive [Tape] Itching    Depends on the type of medical tape    Social History   Socioeconomic History   Marital status: Single    Spouse name: Not on file   Number of children: Not on file   Years of education: Not on file   Highest education level: Not on file  Occupational History   Not on file  Tobacco Use   Smoking status: Former    Types: Cigarettes   Smokeless tobacco: Never   Tobacco comments:    socially  Vaping Use   Vaping Use: Never used  Substance and Sexual Activity   Alcohol use: Yes    Comment: occasional   Drug use: No   Sexual activity: Yes    Birth control/protection: None, Condom  Other Topics Concern   Not on file  Social History Narrative   Not on file   Social Determinants of Health   Financial Resource Strain: Not on file  Food Insecurity: Not on file  Transportation Needs: Not on file  Physical Activity: Not on file  Stress: Not on file  Social Connections: Not on file    Family History  Problem Relation Age of Onset   Cirrhosis Mother    Diabetes Mother    Cancer Maternal Grandfather    Hypertension Sister    Diabetes Sister    Anesthesia problems Neg Hx    Hypotension Neg Hx    Malignant hyperthermia Neg Hx    Pseudochol deficiency Neg Hx     Medications:       Current Outpatient Medications:    acetaminophen (TYLENOL) 325 MG tablet, Take 650 mg by mouth every 6 (six) hours as needed for headache., Disp: , Rfl:    albuterol (PROVENTIL HFA) 108 (90 Base) MCG/ACT inhaler, Inhale 1-2 puffs into the lungs every 6 (six) hours as needed for wheezing or shortness of breath. , Disp: , Rfl:    Multiple Vitamins-Calcium (ONE-A-DAY WOMENS PO), Take 1 tablet by mouth daily., Disp: , Rfl:    UNABLE TO FIND, Med for heart-1.5 tabs daily, Disp: , Rfl:   Objective Height '5\' 2"'$  (1.575 m), weight (!) 349 lb (158.3 kg), last menstrual period 07/05/2021.  ***  Pertinent ROS ***  Labs or studies ***    Impression Diagnoses this  Encounter:: No diagnosis found.  Established relevant diagnosis(es): ***  Plan/Recommendations: No orders of the defined types were placed in this encounter.   Labs or Scans Ordered: No orders of the defined types were placed in this encounter.   Management:: ***  Follow up No follow-ups on file.     {78295: 10 min     99213: 15 min  32440:10 min}   Face to face time:  *** minutes  Greater than 50% of the visit time was spent in counseling and coordination of care with the patient.  The summary and outline of the counseling and care coordination is summarized in the note above.   All questions were answered.

## 2021-07-14 LAB — CYTOLOGY - PAP
Chlamydia: NEGATIVE
Comment: NEGATIVE
Comment: NEGATIVE
Comment: NORMAL
Diagnosis: NEGATIVE
Diagnosis: REACTIVE
High risk HPV: NEGATIVE
Neisseria Gonorrhea: NEGATIVE

## 2021-07-28 ENCOUNTER — Other Ambulatory Visit: Payer: Self-pay

## 2021-07-28 ENCOUNTER — Encounter (HOSPITAL_COMMUNITY): Payer: Self-pay | Admitting: *Deleted

## 2021-07-28 ENCOUNTER — Emergency Department (HOSPITAL_COMMUNITY)
Admission: EM | Admit: 2021-07-28 | Discharge: 2021-07-29 | Disposition: A | Discharge status: Home / Self Care | Payer: 59 | Attending: Student | Admitting: Student

## 2021-07-28 DIAGNOSIS — Z7901 Long term (current) use of anticoagulants: Secondary | ICD-10-CM | POA: Insufficient documentation

## 2021-07-28 DIAGNOSIS — Z87891 Personal history of nicotine dependence: Secondary | ICD-10-CM | POA: Insufficient documentation

## 2021-07-28 DIAGNOSIS — I10 Essential (primary) hypertension: Secondary | ICD-10-CM | POA: Insufficient documentation

## 2021-07-28 DIAGNOSIS — N3000 Acute cystitis without hematuria: Secondary | ICD-10-CM

## 2021-07-28 DIAGNOSIS — R109 Unspecified abdominal pain: Secondary | ICD-10-CM | POA: Diagnosis not present

## 2021-07-28 DIAGNOSIS — R0789 Other chest pain: Secondary | ICD-10-CM | POA: Diagnosis present

## 2021-07-28 DIAGNOSIS — R1084 Generalized abdominal pain: Secondary | ICD-10-CM

## 2021-07-28 DIAGNOSIS — R072 Precordial pain: Secondary | ICD-10-CM

## 2021-07-28 LAB — COMPREHENSIVE METABOLIC PANEL
ALT: 18 U/L (ref 0–44)
AST: 18 U/L (ref 15–41)
Albumin: 3.7 g/dL (ref 3.5–5.0)
Alkaline Phosphatase: 60 U/L (ref 38–126)
Anion gap: 8 (ref 5–15)
BUN: 12 mg/dL (ref 6–20)
CO2: 24 mmol/L (ref 22–32)
Calcium: 8.8 mg/dL — ABNORMAL LOW (ref 8.9–10.3)
Chloride: 104 mmol/L (ref 98–111)
Creatinine, Ser: 0.6 mg/dL (ref 0.44–1.00)
GFR, Estimated: >60 mL/min (ref >60)
Glucose, Bld: 92 mg/dL (ref 70–99)
Potassium: 3.6 mmol/L (ref 3.5–5.1)
Sodium: 136 mmol/L (ref 135–145)
Total Bilirubin: 0.6 mg/dL (ref 0.3–1.2)
Total Protein: 7.2 g/dL (ref 6.5–8.1)

## 2021-07-28 LAB — CBC WITH DIFFERENTIAL/PLATELET
Abs Immature Granulocytes: 0.02 10*3/uL (ref 0.00–0.07)
Basophils Absolute: 0 10*3/uL (ref 0.0–0.1)
Basophils Relative: 0 %
Eosinophils Absolute: 0.1 10*3/uL (ref 0.0–0.5)
Eosinophils Relative: 2 %
HCT: 36.1 % (ref 36.0–46.0)
Hemoglobin: 11.3 g/dL — ABNORMAL LOW (ref 12.0–15.0)
Immature Granulocytes: 0 %
Lymphocytes Relative: 40 %
Lymphs Abs: 2.3 10*3/uL (ref 0.7–4.0)
MCH: 27 pg (ref 26.0–34.0)
MCHC: 31.3 g/dL (ref 30.0–36.0)
MCV: 86.2 fL (ref 80.0–100.0)
Monocytes Absolute: 0.5 10*3/uL (ref 0.1–1.0)
Monocytes Relative: 8 %
Neutro Abs: 2.9 10*3/uL (ref 1.7–7.7)
Neutrophils Relative %: 50 %
Platelets: 284 10*3/uL (ref 150–400)
RBC: 4.19 MIL/uL (ref 3.87–5.11)
RDW: 14.9 % (ref 11.5–15.5)
WBC: 5.9 10*3/uL (ref 4.0–10.5)
nRBC: 0 % (ref 0.0–0.2)

## 2021-07-28 LAB — LIPASE, BLOOD: Lipase: 25 U/L (ref 11–51)

## 2021-07-28 LAB — TROPONIN I (HIGH SENSITIVITY): Troponin I (High Sensitivity): <2 ng/L (ref <18)

## 2021-07-28 NOTE — ED Provider Notes (Signed)
?[facility] ?Provider Note ? ?CSN: [id number] ?Arrival date & time: 07/28/21 2046 ? ?Chief Complaint(s) ?Chest Pain and Abdominal Pain ? ?HPI ?Jessica Sanchez is a 36 y.o. female with PMH SVT, bleeding uterine fibroid who presents emergency department for evaluation of abdominal pain and chest pain.  Patient states that the pain is primarily in the lower chest near the epigastrium and in the suprapubic region.  She denies vaginal bleeding, shortness of breath, nausea, vomiting or other systemic symptoms. ? ? ?Chest Pain ?Associated symptoms: abdominal pain   ?Abdominal Pain ?Associated symptoms: chest pain   ? ?Past Medical History ?Past Medical History:  ?Diagnosis Date  ? Arthritis   ? right shoulder  ? Dyspnea   ? Dysrhythmia   ? hx of SVT  ? Fibroid (bleeding) (uterine)   ? Fibroid, uterine   ? H/O metrorrhagia   ? Hypertension   ? IUD (intrauterine device) in place 07/11/2015  ? Menorrhagia   ? Obesity, Class III, BMI 40-49.9 (morbid obesity) ([place]) 11/11/2018  ? Sleep apnea   ? uses CPAP - setting 11  ? SVT (supraventricular tachycardia) ([place])   ? ?Patient Active Problem List  ? Diagnosis Date Noted  ? Encounter for IUD removal   ? Menorrhagia 09/04/2020  ? Elevated troponin   ? Attempted IUD removal, unsuccessful 07/05/2019  ? Encounter for IUD insertion 07/05/2019  ? Gastroesophageal reflux disease   ? Nonspecific chest pain 11/11/2018  ? Morbid obesity with body mass index (BMI) of 60.0 to 69.9 in adult [facility]) 11/11/2018  ? Elevated d-dimer 11/11/2018  ? SVT (supraventricular tachycardia) ([place]) 10/04/2018  ? IUD (intrauterine device) in place 07/11/2015  ? Other disorder of menstruation and other abnormal bleeding from female genital tract 10/30/2012  ? ?Home Medication(s) ?Prior to Admission medications   ?Medication Sig Start Date End Date Taking? Authorizing Provider  ?acetaminophen (TYLENOL) 325 MG tablet Take 650 mg by mouth every 6 (six) hours as needed for headache.    [provider]  ?albuterol (PROVENTIL HFA) 108 (90 Base) MCG/ACT inhaler Inhale 1-2 puffs into the lungs every 6 (six) hours as needed for wheezing or shortness of breath.     [provider]  ?megestrol (MEGACE) 40 MG tablet 3 tablets a day for 5 days, 2 tablets a day for 5 days then 1 tablet daily 07/10/21   [name], MD  ?Multiple Vitamins-Calcium (ONE-A-DAY WOMENS PO) Take 1 tablet by mouth daily.    [provider]  ?UNABLE TO FIND Med for heart-1.5 tabs daily    [provider]  ?apixaban (ELIQUIS) 5 MG TABS tablet Take 2 tablets ('10mg'$ ) twice daily for 7 days, then 1 tablet ('5mg'$ ) twice daily 04/20/19 04/20/19  Rodell Perna A, PA-C  ?levonorgestrel (MIRENA) 20 MCG/24HR IUD 1 each by Intrauterine route once.   06/25/20  [provider]  ?                                                                                                                                  ?  Past Surgical History ?Past Surgical History:  ?Procedure Laterality Date  ? HYSTEROSCOPY N/A 09/17/2020  ? Procedure: HYSTEROSCOPY;  Surgeon: [name], MD;  Location: [facility];  Service: Gynecology;  Laterality: N/A;  ? HYSTEROSCOPY WITH D & C N/A 09/27/2013  ? Procedure: DILATATION AND CURETTAGE /HYSTEROSCOPY and insertion of Mirena IUD ;  Surgeon: [name]. Harrington Challenger, MD;  Location: [facility];  Service: Gynecology;  Laterality: N/A;  ? IUD REMOVAL N/A 09/17/2020  ? Procedure: INTRAUTERINE DEVICE (IUD) REMOVAL (2);  Surgeon: [name], MD;  Location: [facility];  Service: Gynecology;  Laterality: N/A;  ? MYOMECTOMY  02/03/2011  ? Procedure: MYOMECTOMY;  Surgeon: [name], MD;  Location: [facility];  Service: Gynecology;  Laterality: N/A;  ? SVT ABLATION N/A 11/08/2018  ? Procedure: SVT ABLATION;  Surgeon: [name], MD;  Location: [facility];  Service: Cardiovascular;  Laterality: N/A;  ? SVT ABLATION N/A 12/26/2020  ? Procedure: SVT ABLATION;  Surgeon: [name], MD;  Location: [facility];   Service: Cardiovascular;  Laterality: N/A;  ? TONSILLECTOMY    ? ?Family History ?Family History  ?Problem Relation Age of Onset  ? Cirrhosis Mother   ? Diabetes Mother   ? Cancer Maternal Grandfather   ? Hypertension Sister   ? Diabetes Sister   ? Anesthesia problems Neg Hx   ? Hypotension Neg Hx   ? Malignant hyperthermia Neg Hx   ? Pseudochol deficiency Neg Hx   ? ? ?Social History ?Social History  ? ?Tobacco Use  ? Smoking status: Former  ?  Types: Cigarettes  ? Smokeless tobacco: Never  ? Tobacco comments:  ?  socially  ?Vaping Use  ? Vaping Use: Never used  ?Substance Use Topics  ? Alcohol use: Yes  ?  Comment: occasional  ? Drug use: No  ? ?Allergies ?Shellfish allergy, Sulfa antibiotics, and Adhesive [tape] ? ?Review of Systems ?Review of Systems  ?Cardiovascular:  Positive for chest pain.  ?Gastrointestinal:  Positive for abdominal pain.  ? ?Physical Exam ?Vital Signs  ?I have reviewed the triage vital signs ?BP 140/66   Pulse 88   Temp 98 ?F (36.7 ?C) (Oral)   Resp (!) 26   Ht '5\' 3"'$  (1.6 m)   Wt (!) 149.7 kg   LMP 07/05/2021   SpO2 100%   BMI 58.46 kg/m?  ? ?Physical Exam ?Vitals and nursing note reviewed.  ?Constitutional:   ?   General: She is not in acute distress. ?   Appearance: She is well-developed.  ?HENT:  ?   Head: Normocephalic and atraumatic.  ?Eyes:  ?   Conjunctiva/sclera: Conjunctivae normal.  ?Cardiovascular:  ?   Rate and Rhythm: Normal rate and regular rhythm.  ?   Heart sounds: No murmur heard. ?Pulmonary:  ?   Effort: Pulmonary effort is normal. No respiratory distress.  ?   Breath sounds: Normal breath sounds.  ?Abdominal:  ?   Palpations: Abdomen is soft.  ?   Tenderness: There is abdominal tenderness in the epigastric area and suprapubic area.  ?Musculoskeletal:     ?   General: No swelling.  ?   Cervical back: Neck supple.  ?Skin: ?   General: Skin is warm and dry.  ?   Capillary Refill: Capillary refill takes less than 2 seconds.  ?Neurological:  ?   Mental Status: She is  alert.  ?Psychiatric:     ?   Mood and Affect: Mood normal.  ? ? ?  ED Results and Treatments ?Labs ?(all labs ordered are listed, but only abnormal results are displayed) ?Labs Reviewed  ?CBC WITH DIFFERENTIAL/PLATELET  ?COMPREHENSIVE METABOLIC PANEL  ?LIPASE, BLOOD  ?URINALYSIS, ROUTINE W REFLEX MICROSCOPIC  ?                                                                                                                       ? ?Radiology ?No results found. ? ?Pertinent labs & imaging results that were available during my care of the patient were reviewed by me and considered in my medical decision making (see MDM for details). ? ?Medications Ordered in ED ?Medications - No data to display                                                               ?                                                                    ?Procedures ?Procedures ? ?(including critical care time) ? ?Medical Decision Making / ED Course ? ? ?This patient presents to the ED for concern of chest pain and abdominal pain, this involves an extensive number of treatment options, and is a complaint that carries with it a high risk of complications and morbidity.  The differential diagnosis includes gastritis, pancreatitis, biliary pathology, UTI, enteritis ? ?MDM: ?Patient seen Emergency Department for evaluation of chest and abdominal pain.  Physical exam with tenderness in the epigastrium and over the suprapubic region.  Laboratory evaluation and chest x-ray pending at time of discharge.  ECG nonischemic.  Patient then signed out to oncoming provider please see provider note for continuation of work-up. ? ? ?Additional history obtained: ? ?-External records from outside source obtained and reviewed including: Chart review including previous notes, labs, imaging, consultation notes ? ? ?Lab Tests: ?-I ordered, reviewed, and interpreted labs.   ?The pertinent results include:   ?Labs Reviewed  ?CBC WITH DIFFERENTIAL/PLATELET  ?COMPREHENSIVE  METABOLIC PANEL  ?LIPASE, BLOOD  ?URINALYSIS, ROUTINE W REFLEX MICROSCOPIC  ?  ? ? ?EKG  ? EKG Interpretation ? ?Date/Time:  Tuesday July 28 2021 21:18:41 EDT ?Ventricular Rate:  89 ?PR Interval:  146 ?QRS Duration: 98

## 2021-07-28 NOTE — ED Triage Notes (Signed)
Pt with mid cp since last night.  Abd pain since Sunday.  Denies any N/V.  LBM today and black in color this am per pt. ?

## 2021-07-28 NOTE — ED Notes (Signed)
Pt is a difficult stick. This RN assess pt's arms. Pt told this RN that they usually do an ultrasound IV. Will get an RN to do an ultrasound IV ?

## 2021-07-29 ENCOUNTER — Emergency Department (HOSPITAL_COMMUNITY): Payer: 59

## 2021-07-29 ENCOUNTER — Other Ambulatory Visit: Payer: Self-pay | Admitting: Obstetrics & Gynecology

## 2021-07-29 LAB — URINALYSIS, ROUTINE W REFLEX MICROSCOPIC
Bilirubin Urine: NEGATIVE
Glucose, UA: NEGATIVE mg/dL
Hgb urine dipstick: NEGATIVE
Ketones, ur: 5 mg/dL — AB
Nitrite: NEGATIVE
Protein, ur: NEGATIVE mg/dL
Specific Gravity, Urine: 1.02 (ref 1.005–1.030)
pH: 6 (ref 5.0–8.0)

## 2021-07-29 LAB — PREGNANCY, URINE: Preg Test, Ur: NEGATIVE

## 2021-07-29 MED ORDER — CEPHALEXIN 500 MG PO CAPS: 500.0000 mg | ORAL_CAPSULE | Freq: Four times a day (QID) | ORAL | 0 refills | Status: DC

## 2021-07-29 MED ORDER — IOHEXOL 300 MG/ML  SOLN
100.0000 mL | Freq: Once | INTRAMUSCULAR | Status: AC | PRN
  Administered 2021-07-29: 100 mL via INTRAVENOUS

## 2021-07-29 MED ORDER — FENTANYL CITRATE PF 50 MCG/ML IJ SOSY
100.0000 ug | PREFILLED_SYRINGE | Freq: Once | INTRAMUSCULAR | Status: AC
  Administered 2021-07-29: 100 ug via INTRAVENOUS
  Filled 2021-07-29: qty 2

## 2021-07-29 MED ORDER — CEPHALEXIN 500 MG PO CAPS
500.0000 mg | ORAL_CAPSULE | Freq: Once | ORAL | Status: AC
  Administered 2021-07-29: 500 mg via ORAL
  Filled 2021-07-29: qty 1

## 2021-07-29 MED ORDER — METRONIDAZOLE 500 MG PO TABS: 500.0000 mg | ORAL_TABLET | Freq: Two times a day (BID) | ORAL | 1 refills | Status: DC

## 2021-07-29 NOTE — ED Notes (Addendum)
Pt refused CT scan. Pt stated her nerves got the better part of her.  EDP made aware. ?

## 2021-07-29 NOTE — ED Notes (Signed)
Pt ambulated to the bathroom unassisted.  

## 2021-07-29 NOTE — ED Provider Notes (Signed)
Patient refusing CT scan.  Reports she got too nervous and she does not think she can undergo the testing ?She is awake alert, using her phone in no acute distress.  After further discussion, patient would like to be discharged without completing CT scan.  She will be started on oral antibiotics.  We discussed strict ER return precautions ? ? ?  ?Ripley Fraise, MD ?07/29/21 [phone] ? ?

## 2021-07-29 NOTE — ED Provider Notes (Signed)
I assumed care in signout to follow-up on labs.  Patient now reporting continued lower abdominal pain.  She reports previous abdominal surgeries include "removing multiple tumors" from her lower abdomen.  She did mention to nursing she had 1 episode of black stool earlier in the day but reports has improved and no bloody stool.  Due to persistent abdominal pain will proceed with CT image ? ?Patient noted to have UTI will treat ? ? ?  ?Ripley Fraise, MD ?07/29/21 [phone] ? ?

## 2021-07-29 NOTE — Discharge Instructions (Signed)

## 2021-07-29 NOTE — ED Notes (Signed)
Pt taken to xray 

## 2021-07-30 ENCOUNTER — Encounter: Payer: Self-pay | Admitting: *Deleted

## 2021-08-17 ENCOUNTER — Encounter (HOSPITAL_COMMUNITY): Payer: Self-pay

## 2021-08-17 ENCOUNTER — Emergency Department (HOSPITAL_COMMUNITY)
Admission: EM | Admit: 2021-08-17 | Discharge: 2021-08-17 | Disposition: A | Discharge status: Home / Self Care | Payer: 59 | Attending: Emergency Medicine | Admitting: Emergency Medicine

## 2021-08-17 ENCOUNTER — Other Ambulatory Visit: Payer: Self-pay

## 2021-08-17 DIAGNOSIS — M79661 Pain in right lower leg: Secondary | ICD-10-CM | POA: Diagnosis present

## 2021-08-17 DIAGNOSIS — R600 Localized edema: Secondary | ICD-10-CM | POA: Insufficient documentation

## 2021-08-17 DIAGNOSIS — R2243 Localized swelling, mass and lump, lower limb, bilateral: Secondary | ICD-10-CM | POA: Insufficient documentation

## 2021-08-17 DIAGNOSIS — Z7901 Long term (current) use of anticoagulants: Secondary | ICD-10-CM | POA: Diagnosis not present

## 2021-08-17 DIAGNOSIS — M7989 Other specified soft tissue disorders: Secondary | ICD-10-CM | POA: Diagnosis not present

## 2021-08-17 DIAGNOSIS — R609 Edema, unspecified: Secondary | ICD-10-CM

## 2021-08-17 LAB — COMPREHENSIVE METABOLIC PANEL
ALT: 16 U/L (ref 0–44)
AST: 15 U/L (ref 15–41)
Albumin: 3.8 g/dL (ref 3.5–5.0)
Alkaline Phosphatase: 51 U/L (ref 38–126)
Anion gap: 7 (ref 5–15)
BUN: 13 mg/dL (ref 6–20)
CO2: 24 mmol/L (ref 22–32)
Calcium: 8.9 mg/dL (ref 8.9–10.3)
Chloride: 105 mmol/L (ref 98–111)
Creatinine, Ser: 0.47 mg/dL (ref 0.44–1.00)
GFR, Estimated: >60 mL/min (ref >60)
Glucose, Bld: 85 mg/dL (ref 70–99)
Potassium: 4.3 mmol/L (ref 3.5–5.1)
Sodium: 136 mmol/L (ref 135–145)
Total Bilirubin: 0.6 mg/dL (ref 0.3–1.2)
Total Protein: 7.6 g/dL (ref 6.5–8.1)

## 2021-08-17 LAB — CBC
HCT: 38.5 % (ref 36.0–46.0)
Hemoglobin: 11.8 g/dL — ABNORMAL LOW (ref 12.0–15.0)
MCH: 26.1 pg (ref 26.0–34.0)
MCHC: 30.6 g/dL (ref 30.0–36.0)
MCV: 85.2 fL (ref 80.0–100.0)
Platelets: 297 10*3/uL (ref 150–400)
RBC: 4.52 MIL/uL (ref 3.87–5.11)
RDW: 15 % (ref 11.5–15.5)
WBC: 5.4 10*3/uL (ref 4.0–10.5)
nRBC: 0 % (ref 0.0–0.2)

## 2021-08-17 LAB — BRAIN NATRIURETIC PEPTIDE: B Natriuretic Peptide: 12 pg/mL (ref 0.0–100.0)

## 2021-08-17 NOTE — Discharge Instructions (Signed)
You have been scheduled to return here tomorrow for an ultrasound of your right lower leg.  Please call the radiology scheduling tomorrow morning at [phone] to arrange an appointment time for your ultrasound.  Elevate your legs when possible.  Please follow-up with your primary care provider for recheck. ?

## 2021-08-17 NOTE — ED Triage Notes (Signed)
Patient states right lower leg pain with knot on right shin with swelling and warmth. Patient states bilateral swelling over the past couple pf days.  ?

## 2021-08-17 NOTE — ED Provider Notes (Signed)
?[facility] ?Provider Note ? ? ?CSN: [id number] ?Arrival date & time: 08/17/21  1131 ? ?  ? ?History ? ?Chief Complaint  ?Patient presents with  ? Leg Swelling  ? ? ?Jessica Sanchez is a 36 y.o. female. ? ?HPI ? ?  ? ? ?Jessica Sanchez is a 36 y.o. female who presents to the Emergency Department complaining of swelling of her bilateral lower legs with pain and anterior swelling of the right lower leg for several days.  She has pain to her right leg associated with weightbearing.  She noticed a "knot" to her lower leg near her ankle several days ago.  She states the area has appeared shiny and feels warm to the touch.  She denies known injury.  No fever or chills.  No chest pain or shortness of breath.  She denies history of recent travel, and history of PE or DVT. ? ?Home Medications ?Prior to Admission medications   ?Medication Sig Start Date End Date Taking? Authorizing Provider  ?acetaminophen (TYLENOL) 325 MG tablet Take 650 mg by mouth every 6 (six) hours as needed for headache.    [provider]  ?albuterol (PROVENTIL HFA) 108 (90 Base) MCG/ACT inhaler Inhale 1-2 puffs into the lungs every 6 (six) hours as needed for wheezing or shortness of breath.     [provider]  ?cephALEXin (KEFLEX) 500 MG capsule Take 1 capsule (500 mg total) by mouth 4 (four) times daily. 07/29/21   Ripley Fraise, MD  ?megestrol (MEGACE) 40 MG tablet 3 tablets a day for 5 days, 2 tablets a day for 5 days then 1 tablet daily 07/10/21   [name], MD  ?metroNIDAZOLE (FLAGYL) 500 MG tablet Take 1 tablet (500 mg total) by mouth 2 (two) times daily. 07/29/21   [name], MD  ?Multiple Vitamins-Calcium (ONE-A-DAY WOMENS PO) Take 1 tablet by mouth daily.    [provider]  ?UNABLE TO FIND Med for heart-1.5 tabs daily    [provider]  ?apixaban (ELIQUIS) 5 MG TABS tablet Take 2 tablets ('10mg'$ ) twice daily for 7 days, then 1 tablet ('5mg'$ ) twice daily 04/20/19 04/20/19  Rodell Perna A, PA-C  ?levonorgestrel (MIRENA) 20 MCG/24HR IUD 1 each by Intrauterine route once.   06/25/20  [provider]  ?   ? ?Allergies    ?Shellfish allergy, Sulfa antibiotics, and Adhesive [tape]   ? ?Review of Systems   ?Review of Systems  ?Constitutional:  Negative for chills and fever.  ?Respiratory:  Negative for chest tightness and shortness of breath.   ?Cardiovascular:  Positive for leg swelling. Negative for chest pain.  ?Gastrointestinal:  Negative for abdominal pain, nausea and vomiting.  ?Musculoskeletal:  Positive for myalgias (Pain and swelling of her right lower leg.).  ?Skin:  Positive for color change. Negative for rash and wound.  ?Neurological:  Negative for dizziness, weakness and numbness.  ?All other systems reviewed and are negative. ? ?Physical Exam ?Updated Vital Signs ?BP 114/81   Pulse 86   Temp 98.3 ?F (36.8 ?C)   Resp 18   Ht '5\' 3"'$  (1.6 m)   Wt (!) 167.8 kg   SpO2 98%   BMI 65.54 kg/m?  ?Physical Exam ?Vitals and nursing note reviewed.  ?Constitutional:   ?   General: She is not in acute distress. ?   Appearance: Normal appearance. She is obese. She is not ill-appearing.  ?Cardiovascular:  ?   Rate and Rhythm: Normal rate and regular  rhythm.  ?   Pulses: Normal pulses.  ?Pulmonary:  ?   Effort: Pulmonary effort is normal.  ?   Breath sounds: Normal breath sounds.  ?Musculoskeletal:     ?   General: Tenderness (Patient has diffuse tenderness of the right lower extremity.  There is an area of localized edema to the anterior right lower leg.  There is no erythema or excessive warmth on my exam.  No rash or open wounds no palpable cord) present.  ?   Right lower leg: Edema present.  ?   Left lower leg: Edema present.  ?   Comments: There is some bilateral peripheral edema without pitting.  ?Skin: ?   General: Skin is warm.  ?   Capillary Refill: Capillary refill takes less than 2 seconds.  ?Neurological:  ?   General: No focal deficit present.  ?   Mental Status: She is  alert.  ?   Sensory: No sensory deficit.  ?   Motor: No weakness.  ? ? ?ED Results / Procedures / Treatments   ?Labs ?(all labs ordered are listed, but only abnormal results are displayed) ?Labs Reviewed  ?CBC - Abnormal; Notable for the following components:  ?    Result Value  ? Hemoglobin 11.8 (*)   ? All other components within normal limits  ?COMPREHENSIVE METABOLIC PANEL  ?BRAIN NATRIURETIC PEPTIDE  ? ? ?EKG ?None ? ?Radiology ?No results found. ? ?Procedures ?Procedures  ? ? ?Medications Ordered in ED ?Medications - No data to display ? ?ED Course/ Medical Decision Making/ A&P ?  ?                        ?Medical Decision Making ?Patient here for evaluation of pain swelling and shiny appearance of her right lower extremity.  Endorses swelling of her legs for 1 week.  Right greater than left.  She noticed an area of swelling to the anterior lower leg several days ago. ? ?On exam, patient well-appearing nontoxic.  Vital signs reviewed.  She has swelling to the bilateral lower extremities, there is no excessive warmth or erythema of the right lower extremity.  There is a localized area of swelling to the anterior aspect of the right lower leg.  No history of trauma.  Neurovascularly intact.  There is some posterior tenderness as well.  Clinically I have low suspicion that this is related to DVT.  No evidence on my exam to suggest cellulitis.  Ultrasound unavailable after hours.  Patient is agreeable to return tomorrow for ultrasound imaging of the extremity. ? ?Amount and/or Complexity of Data Reviewed ?External Data Reviewed: notes. ?   Details: Prior medical records reviewed by me ?Labs: ordered. ?   Details: Labs without evidence of leukocytosis, chemistries unremarkable.  BNP also unremarkable. ? ? ? ?Patient has been scheduled to return here tomorrow for outpatient ultrasound of the right lower extremity. ? ? ? ? ? ? ? ? ?Final Clinical Impression(s) / ED Diagnoses ?Final diagnoses:  ?Peripheral edema   ?Pain in right lower leg  ? ? ?Rx / DC Orders ?ED Discharge Orders   ? ?      Ordered  ?  US Venous Img Lower Unilateral Right       ? 08/17/21 1755  ? ?  ?  ? ?  ? ? ?  ?Kem Parkinson, PA-C ?08/21/21 1520 ? ?  ?Fredia Sorrow, MD ?08/22/21 0913 ? ?

## 2021-08-18 ENCOUNTER — Ambulatory Visit (HOSPITAL_COMMUNITY)
Admission: RE | Admit: 2021-08-18 | Discharge: 2021-08-18 | Disposition: A | Discharge status: Home / Self Care | Payer: 59 | Source: Ambulatory Visit | Attending: Emergency Medicine | Admitting: Emergency Medicine

## 2021-08-18 DIAGNOSIS — R600 Localized edema: Secondary | ICD-10-CM | POA: Insufficient documentation

## 2021-08-18 DIAGNOSIS — M79661 Pain in right lower leg: Secondary | ICD-10-CM | POA: Diagnosis present

## 2021-08-18 NOTE — ED Provider Notes (Signed)
Patient returned this morning for scheduled venous imaging of the right lower extremity. ? ?She was seen by me yesterday for same.  Ultrasound results negative for evidence of DVT.  Patient agreeable to recommended treatment plan and close outpatient follow-up. ? ?There is a noted fluid collection to the anterior right lower extremity.  Seen on ultrasound.  Leading edge was marked by me.  Patient was advised to return here in 3 to 4 days if her symptoms are not improving.  This does not appear to be a drainable fluid collection. ? ? ? ?US Venous Img Lower Unilateral Right ? ?Result Date: 08/18/2021 ?CLINICAL DATA:  Right lower extremity pain and swelling EXAM: Right LOWER EXTREMITY VENOUS DOPPLER ULTRASOUND TECHNIQUE: Gray-scale sonography with compression, as well as color and duplex ultrasound, were performed to evaluate the deep venous system(s) from the level of the common femoral vein through the popliteal and proximal calf veins. COMPARISON:  Lower extremity duplex ultrasound 01/27/2018 FINDINGS: VENOUS Normal compressibility of the common femoral, superficial femoral, and popliteal veins, as well as the visualized calf veins, though note that the calf veins are suboptimally evaluated. Visualized portions of profunda femoral vein and great saphenous vein unremarkable. No filling defects to suggest DVT on grayscale or color Doppler imaging. Doppler waveforms show normal direction of venous flow, normal respiratory plasticity and response to augmentation. Limited views of the contralateral common femoral vein are unremarkable. OTHER There is diffuse soft tissue edema throughout the lower. There is complex appearing fluid in the right anterior lower leg in the area of pain without associated hyperemia. The fluid does not appear organized or drainable. Limitations: none IMPRESSION: 1. No evidence of DVT in the right lower extremity, though the calf veins are suboptimally evaluated. 2. Edema in the right lower leg  with complex fluid in the anterior lower leg in the area of pain. There is no associated hyperemia, and the collection does not appear organized or drainable. Correlate with physical exam and history. Electronically Signed   By: Valetta Mole M.D.   On: 08/18/2021 11:50   ? ?  ?Kem Parkinson, PA-C ?08/18/21 1305 ? ?  ?Varney Biles, MD ?08/19/21 [phone] ? ?

## 2021-08-19 ENCOUNTER — Telehealth: Payer: Self-pay

## 2021-08-19 NOTE — Telephone Encounter (Signed)
Jessica Sanchez called and asked if I would call the patient back per Roby Lofts regarding if we saw patients specifically for leg swelling. I spoke with patient and advised her that if it is just leg swelling, that is more of a PCP appt or venous doctor. Advised her to call [facility] to see if they were still accepting new patients. Patient was seen by Dr.Gregg Lovena Le (cardiology) in October of last year. Advised her she could call and make an appt with them due to the leg swelling. Patient did not complain of any pain. Patient in agreement with treatment plan ?

## 2021-08-21 ENCOUNTER — Other Ambulatory Visit: Payer: Self-pay | Admitting: Obstetrics & Gynecology

## 2021-08-21 ENCOUNTER — Ambulatory Visit
Admission: EM | Admit: 2021-08-21 | Discharge: 2021-08-21 | Disposition: A | Discharge status: Home / Self Care | Payer: 59 | Attending: Family Medicine | Admitting: Family Medicine

## 2021-08-21 DIAGNOSIS — N920 Excessive and frequent menstruation with regular cycle: Secondary | ICD-10-CM

## 2021-08-21 DIAGNOSIS — Z9889 Other specified postprocedural states: Secondary | ICD-10-CM

## 2021-08-21 DIAGNOSIS — R600 Localized edema: Secondary | ICD-10-CM

## 2021-08-21 DIAGNOSIS — M79604 Pain in right leg: Secondary | ICD-10-CM | POA: Diagnosis not present

## 2021-08-21 NOTE — ED Triage Notes (Signed)
Pt states her right leg and foot is swollen and in pain since about 7 days ago ? ?Pt states she went to the ER about 4 days ago and they ruled out blood clot but didn't give her any pain meds ?

## 2021-08-21 NOTE — ED Provider Notes (Signed)
?[facility] ? ? ? ?CSN: [id number] ?Arrival date & time: 08/21/21  1528 ? ? ?  ? ?History   ?Chief Complaint ?Chief Complaint  ?Patient presents with  ? Leg Swelling  ?  Leg and foot swelling  ? ? ?HPI ?Jessica Sanchez is a 36 y.o. female.  ? ?Presenting today with about a week of bilateral leg swelling worse than her baseline, particularly in anterior right lower leg.  She denies known injury to the area or other inciting event.  She was seen in the emergency department 3 or 4 days ago for this and had a DVT ultrasound which was negative for blood clot.  She denies chest pain, shortness of breath, injury to the area, fever, chills, sweats, body aches, numbness or tingling.  She states at baseline she does have moderate leg edema for which she takes furosemide 40 mg daily.  This does not seem to make any benefit to her current issue.  She has been off work elevating her legs for the past 5 days per ER note with no benefit. ? ? ?Past Medical History:  ?Diagnosis Date  ? Arthritis   ? right shoulder  ? Dyspnea   ? Dysrhythmia   ? hx of SVT  ? Fibroid (bleeding) (uterine)   ? Fibroid, uterine   ? H/O metrorrhagia   ? Hypertension   ? IUD (intrauterine device) in place 07/11/2015  ? Menorrhagia   ? Obesity, Class III, BMI 40-49.9 (morbid obesity) ([place]) 11/11/2018  ? Sleep apnea   ? uses CPAP - setting 11  ? SVT (supraventricular tachycardia) ([place])   ? ? ?Patient Active Problem List  ? Diagnosis Date Noted  ? Encounter for IUD removal   ? Menorrhagia 09/04/2020  ? Elevated troponin   ? Attempted IUD removal, unsuccessful 07/05/2019  ? Encounter for IUD insertion 07/05/2019  ? Gastroesophageal reflux disease   ? Nonspecific chest pain 11/11/2018  ? Morbid obesity with body mass index (BMI) of 60.0 to 69.9 in adult [facility]) 11/11/2018  ? Elevated d-dimer 11/11/2018  ? SVT (supraventricular tachycardia) ([place]) 10/04/2018  ? IUD (intrauterine device) in place 07/11/2015  ? Other disorder of menstruation and other abnormal  bleeding from female genital tract 10/30/2012  ? ? ?Past Surgical History:  ?Procedure Laterality Date  ? HYSTEROSCOPY N/A 09/17/2020  ? Procedure: HYSTEROSCOPY;  Surgeon: [name], MD;  [facility];  Service: Gynecology;  Laterality: N/A;  ? HYSTEROSCOPY WITH D & C N/A 09/27/2013  ? Procedure: DILATATION AND CURETTAGE /HYSTEROSCOPY and insertion of Mirena IUD ;  Surgeon: [name]. Harrington Challenger, MD;  Location: [facility];  Service: Gynecology;  Laterality: N/A;  ? IUD REMOVAL N/A 09/17/2020  ? Procedure: INTRAUTERINE DEVICE (IUD) REMOVAL (2);  Surgeon: [name], MD;  [facility]: [facility];  Service: Gynecology;  Laterality: N/A;  ? MYOMECTOMY  02/03/2011  ? Procedure: MYOMECTOMY;  Surgeon: [name], MD;  [facility]: [facility];  Service: Gynecology;  Laterality: N/A;  ? SVT ABLATION N/A 11/08/2018  ? Procedure: SVT ABLATION;  Surgeon: [name], MD;  Location: [place] [facility];  Service: Cardiovascular;  Laterality: N/A;  ? SVT ABLATION N/A 12/26/2020  ? Procedure: SVT ABLATION;  Surgeon: [name], MD;  Location: [place] [facility];  Service: Cardiovascular;  Laterality: N/A;  ? TONSILLECTOMY    ? ? ?OB History   ? ? Gravida  ?1  ? Para  ?   ? Term  ?   ?  Preterm  ?   ? AB  ?1  ? Living  ?   ?  ? ? SAB  ?1  ? IAB  ?   ? Ectopic  ?   ? Multiple  ?   ? Live Births  ?   ?   ?  ?  ? ? ? ?Home Medications   ? ?Prior to Admission medications   ?Medication Sig Start Date End Date Taking? Authorizing Provider  ?acetaminophen (TYLENOL) 325 MG tablet Take 650 mg by mouth every 6 (six) hours as needed for headache.    [provider]  ?albuterol (PROVENTIL HFA) 108 (90 Base) MCG/ACT inhaler Inhale 1-2 puffs into the lungs every 6 (six) hours as needed for wheezing or shortness of breath.     [provider]  ?cephALEXin (KEFLEX) 500 MG capsule Take 1 capsule (500 mg total) by mouth 4 (four) times daily. 07/29/21   Ripley Fraise, MD  ?megestrol (MEGACE) 40 MG tablet 3 tablets a day for 5 days, 2  tablets a day for 5 days then 1 tablet daily 07/10/21   [name], MD  ?metroNIDAZOLE (FLAGYL) 500 MG tablet Take 1 tablet (500 mg total) by mouth 2 (two) times daily. 07/29/21   [name], MD  ?Multiple Vitamins-Calcium (ONE-A-DAY WOMENS PO) Take 1 tablet by mouth daily.    [provider]  ?UNABLE TO FIND Med for heart-1.5 tabs daily    [provider]  ?apixaban (ELIQUIS) 5 MG TABS tablet Take 2 tablets ('10mg'$ ) twice daily for 7 days, then 1 tablet ('5mg'$ ) twice daily 04/20/19 04/20/19  Rodell Perna A, PA-C  ?levonorgestrel (MIRENA) 20 MCG/24HR IUD 1 each by Intrauterine route once.   06/25/20  [provider]  ? ? ?Family History ?Family History  ?Problem Relation Age of Onset  ? Cirrhosis Mother   ? Diabetes Mother   ? Cancer Maternal Grandfather   ? Hypertension Sister   ? Diabetes Sister   ? Anesthesia problems Neg Hx   ? Hypotension Neg Hx   ? Malignant hyperthermia Neg Hx   ? Pseudochol deficiency Neg Hx   ? ? ?Social History ?Social History  ? ?Tobacco Use  ? Smoking status: Former  ?  Types: Cigarettes  ? Smokeless tobacco: Never  ? Tobacco comments:  ?  socially  ?Vaping Use  ? Vaping Use: Never used  ?Substance Use Topics  ? Alcohol use: Yes  ?  Comment: occasional  ? Drug use: No  ? ? ? ?Allergies   ?Shellfish allergy, Sulfa antibiotics, and Adhesive [tape] ? ? ?Review of Systems ?Review of Systems ?Per HPI ? ?Physical Exam ?Triage Vital Signs ?ED Triage Vitals  ?Enc Vitals Group  ?   BP 08/21/21 1534 (!) 141/71  ?   Pulse Rate 08/21/21 1534 87  ?   Resp 08/21/21 1534 20  ?   Temp 08/21/21 1534 97.9 ?F (36.6 ?C)  ?   Temp Source 08/21/21 1534 Oral  ?   SpO2 08/21/21 1534 95 %  ?   Weight --   ?   Height --   ?   Head Circumference --   ?   Peak Flow --   ?   Pain Score 08/21/21 1536 10  ?   Pain Loc --   ?   Pain Edu? --   ?   Excl. in [place]? --   ? ?No data found. ? ?Updated Vital Signs ?BP (!) 141/71 (BP Location: Right Arm)  Pulse 87   Temp 97.9 ?F (36.6 ?C) (Oral)    Resp 20   LMP 07/17/2021 (Exact Date)   SpO2 95%  ? ?Visual Acuity ?Right Eye Distance:   ?Left Eye Distance:   ?Bilateral Distance:   ? ?Right Eye Near:   ?Left Eye Near:    ?Bilateral Near:    ? ?Physical Exam ?Vitals and nursing note reviewed.  ?Constitutional:   ?   Appearance: Normal appearance. She is not ill-appearing.  ?HENT:  ?   Head: Atraumatic.  ?Eyes:  ?   Extraocular Movements: Extraocular movements intact.  ?   Conjunctiva/sclera: Conjunctivae normal.  ?Cardiovascular:  ?   Rate and Rhythm: Normal rate and regular rhythm.  ?   Heart sounds: Normal heart sounds.  ?Pulmonary:  ?   Effort: Pulmonary effort is normal.  ?   Breath sounds: Normal breath sounds.  ?Musculoskeletal:     ?   General: Swelling and tenderness present. No signs of injury. Normal range of motion.  ?   Cervical back: Normal range of motion and neck supple.  ?   Comments: Moderate diffuse lower extremity edema bilaterally, localized area anterior right lower leg more edematous than the rest and significantly tender to palpation.  ?Skin: ?   General: Skin is warm and dry.  ?   Findings: No erythema.  ?Neurological:  ?   Mental Status: She is alert and oriented to person, place, and time.  ?   Comments: Bilateral lower extremities neurovascularly intact  ?Psychiatric:     ?   Mood and Affect: Mood normal.     ?   Thought Content: Thought content normal.     ?   Judgment: Judgment normal.  ? ? ? ?UC Treatments / Results  ?Labs ?(all labs ordered are listed, but only abnormal results are displayed) ?Labs Reviewed - No data to display ? ?EKG ? ? ?Radiology ?No results found. ? ?Procedures ?Procedures (including critical care time) ? ?Medications Ordered in UC ?Medications - No data to display ? ?Initial Impression / Assessment and Plan / UC Course  ?I have reviewed the triage vital signs and the nursing notes. ? ?Pertinent labs & imaging results that were available during my care of the patient were reviewed by me and considered in my  medical decision making (see chart for details). ? ?  ? ?DVT ultrasound negative in the emergency department 4 days ago, labs at this time also reassuring.  Vital signs benign and reassuring, she is already on L

## 2021-08-24 ENCOUNTER — Encounter: Payer: Self-pay | Admitting: Obstetrics & Gynecology

## 2021-08-24 ENCOUNTER — Ambulatory Visit (INDEPENDENT_AMBULATORY_CARE_PROVIDER_SITE_OTHER): Payer: 59

## 2021-08-24 ENCOUNTER — Ambulatory Visit: Payer: 59 | Admitting: Obstetrics & Gynecology

## 2021-08-24 VITALS — BP 149/90 | HR 79

## 2021-08-24 DIAGNOSIS — N938 Other specified abnormal uterine and vaginal bleeding: Secondary | ICD-10-CM

## 2021-08-24 DIAGNOSIS — N920 Excessive and frequent menstruation with regular cycle: Secondary | ICD-10-CM | POA: Diagnosis not present

## 2021-08-24 DIAGNOSIS — Z9889 Other specified postprocedural states: Secondary | ICD-10-CM | POA: Diagnosis not present

## 2021-08-24 DIAGNOSIS — D219 Benign neoplasm of connective and other soft tissue, unspecified: Secondary | ICD-10-CM

## 2021-08-24 NOTE — Progress Notes (Signed)
Follow up appointment for results: ?[place] and response to megestrol ? ?Chief Complaint  ?Patient presents with  ? Follow-up  ?  [place] today  ? ? ?Blood pressure (!) 149/90, pulse 79. ? ?US PELVIC COMPLETE WITH TRANSVAGINAL ? ?Result Date: 08/24/2021 ?Images from the original result were not included.  ..an [employer] of Ultrasound Medicine Diplomatic Services operational officer) accredited practice Center for [facility] @ [facility] [place] [address] Ordering Provider: [name], MD                                                                                              GYNECOLOGIC SONOGRAM Jessica Sanchez is a 36 y.o. G1P0010 LMP 08/23/2021,She is here for a pelvic sonogram for menorrhagia,fibroids. Uterus                      9.9 x 8.4 x 10 cm, Total uterine volume 433 cc,enlarged heterogeneous anteverted uterus with multiple fibroids,(#1) fundal subserosal fibroid 6.9 x 5.3 x 6.7 cm,(#2) anterior subserosal fibroid 7.1 x 7.5 x 6.8 cm,(#3) subserosal lower uterine segment right fibroid 4 x 3.7 x 3.5 cm Endometrium          17.6 mm, symmetrical, limited view Right ovary             2.5 x 2.4 x 3.2 cm, normal,limited view Left ovary                2.6 x 3.4 x 1.8 cm, normal,limited view No free fluid Technician Comments: PELVIC US TA/TV:enlarged heterogeneous anteverted uterus with multiple fibroids,(#1) fundal subserosal fibroid 6.9 x 5.3 x 6.7 cm,(#2) anterior subserosal fibroid 7.1 x 7.5 x 6.8 cm,(#3) subserosal lower uterine segment right fibroid 4 x 3.7 x 3.5 cm,EEC 17.6 mm (limited view),normal ovaries (limited view),no free fluid,no pain during ultrasound Chaperone [address] Heide Guile 08/24/2021 11:29 AM Clinical Impression and recommendations: I have reviewed the sonogram results above, combined with the patient's current clinical course, below are my impressions and any appropriate recommendations for management based on the sonographic findings. Uterus comparison study 10/21 uterus is about 50%  larger with corresponding growth in fibroids Endometrium 17 mm on megestrol which is the reason for the thickening Ovaries: normal size shape and morphology [name] 08/24/2021 11:44 AM   ? ? ? ?MEDS ordered this encounter: ?No orders of the defined types were placed in this encounter. ? ? ?Orders for this encounter: ?No orders of the defined types were placed in this encounter. ? ? ?Impression + Management Plan ?  ICD-10-CM   ?1. DUB (dysfunctional uterine bleeding)  N93.8   ? stop megestrol now, start slynd in 7 days  ?  ?2. Fibroid  D21.9   ? 50% volume increase change  ?  ? ? ?Follow Up: ?Return if symptoms worsen or fail to improve. ? ? ? ? All questions were answered. ? ?Past Medical History:  ?Diagnosis Date  ? Arthritis   ? right shoulder  ? Dyspnea   ? Dysrhythmia   ? hx of SVT  ? Fibroid (bleeding) (uterine)   ?  Fibroid, uterine   ? H/O metrorrhagia   ? Hypertension   ? IUD (intrauterine device) in place 07/11/2015  ? Menorrhagia   ? Obesity, Class III, BMI 40-49.9 (morbid obesity) ([place]) 11/11/2018  ? Sleep apnea   ? uses CPAP - setting 11  ? SVT (supraventricular tachycardia) ([place])   ? ? ?Past Surgical History:  ?Procedure Laterality Date  ? HYSTEROSCOPY N/A 09/17/2020  ? Procedure: HYSTEROSCOPY;  Surgeon: [name], MD;  [facility];  Service: Gynecology;  Laterality: N/A;  ? HYSTEROSCOPY WITH D & C N/A 09/27/2013  ? Procedure: DILATATION AND CURETTAGE /HYSTEROSCOPY and insertion of Mirena IUD ;  Surgeon: [name]. Harrington Challenger, MD;  Location: [facility];  Service: Gynecology;  Laterality: N/A;  ? IUD REMOVAL N/A 09/17/2020  ? Procedure: INTRAUTERINE DEVICE (IUD) REMOVAL (2);  Surgeon: [name], MD;  [facility];  Service: Gynecology;  Laterality: N/A;  ? MYOMECTOMY  02/03/2011  ? Procedure: MYOMECTOMY;  Surgeon: [name], MD;  [facility];  Service: Gynecology;  Laterality: N/A;  ? SVT ABLATION N/A 11/08/2018  ? Procedure: SVT ABLATION;  Surgeon: [name], MD;  Location: [place] [facility];  Service: Cardiovascular;  Laterality: N/A;  ? SVT ABLATION N/A 12/26/2020  ? Procedure: SVT ABLATION;  Surgeon: [name], MD;  Location: [place] [facility];  Service: Cardiovascular;  Laterality: N/A;  ? TONSILLECTOMY    ? ? ?OB History   ? ? Gravida  ?1  ? Para  ?   ? Term  ?   ? Preterm  ?   ? AB  ?1  ? Living  ?   ?  ? ? SAB  ?1  ? IAB  ?   ? Ectopic  ?   ? Multiple  ?   ? Live Births  ?   ?   ?  ?  ? ? ?Allergies  ?Allergen Reactions  ? Shellfish Allergy Shortness Of Breath, Itching and Swelling  ?  Mouth became swollen, throat started itching, and developed shortness of breath  ? Sulfa Antibiotics Itching, Shortness Of Breath and Swelling  ?  Mouth became swollen, throat started itching, and developed shortness of breath  ? Adhesive [Tape] Itching  ?  Depends on the type of medical tape  ? ? ?Social History  ? ?Socioeconomic History  ? Marital status: Single  ?  Spouse name: Not on file  ? Number of children: Not on file  ? Years of education: Not on file  ? Highest education level: Not on file  ?Occupational History  ? Not on file  ?Tobacco Use  ? Smoking status: Former  ?  Types: Cigarettes  ? Smokeless tobacco: Never  ? Tobacco comments:  ?  socially  ?Vaping Use  ? Vaping Use: Never used  ?Substance and Sexual Activity  ? Alcohol use: Yes  ?  Comment: occasional  ? Drug use: No  ? Sexual activity: Yes  ?  Birth control/protection: None, Condom  ?Other Topics Concern  ? Not on file  ?Social History Narrative  ? Not on file  ? ?Social Determinants of Health  ? ?Financial Resource Strain: Not on file  ?Food Insecurity: Not on file  ?Transportation Needs: Not on file  ?Physical Activity: Not on file  ?Stress: Not on file  ?Social Connections: Not on file  ? ? ?Family History  ?Problem Relation Age of Onset  ? Cirrhosis Mother   ? Diabetes Mother   ?  Cancer Maternal Grandfather   ? Hypertension Sister   ? Diabetes Sister   ? Anesthesia problems Neg Hx   ? Hypotension Neg Hx   ? Malignant  hyperthermia Neg Hx   ? Pseudochol deficiency Neg Hx   ? ? ?

## 2021-08-24 NOTE — Progress Notes (Signed)
PELVIC US TA/TV:enlarged heterogeneous anteverted uterus with multiple fibroids,(#1) fundal subserosal fibroid 6.9 x 5.3 x 6.7 cm,(#2) anterior subserosal fibroid 7.1 x 7.5 x 6.8 cm,(#3) subserosal lower uterine segment right fibroid 4 x 3.7 x 3.5 cm,EEC 17.6 mm (limited view),normal ovaries (limited view),no free fluid,no pain during ultrasound ? ?Chaperone Peggy ?

## 2021-09-21 ENCOUNTER — Telehealth: Payer: Self-pay | Admitting: Obstetrics & Gynecology

## 2021-09-21 NOTE — Telephone Encounter (Signed)
Returned pt's call, two identifiers used. Pt stated that she just stopped a 10 day period 4 days ago and she woke up to her bed clothes soaked in blood. She had started [facility] at her last visit (Dr Elonda Husky gave office samples), but was having heavy bleeding, clots, and cramping. Dr Nelda Marseille advised to stop the [facility] and start the Megace back again, due to soaking a pad every 30 min to 1 hr. Pt explained that she still had bleeding on the Megace and that's why she was trying something new. Asked pt if the bleeding was lighter on the Megace and she said it was. Pt advised to go ahead and restart the Megace, stop the [facility], and make an appt with Dr Elonda Husky asap per Dr Nelda Marseille. Pt confirmed understanding. Call transferred to front to get that appt scheduled.

## 2021-09-21 NOTE — Telephone Encounter (Signed)
Patient states she is taking medicine you prescribed and she says this is the 2nd time this month her period has came on. The cramping is real bad and it feels like a jabbing feeling in her abdomen. She says she feels groggy. Please advise. She will be going to work at [employer], states she will keep an eye on her phone for your call.

## 2021-09-24 ENCOUNTER — Other Ambulatory Visit: Payer: Self-pay

## 2021-09-24 ENCOUNTER — Emergency Department (HOSPITAL_COMMUNITY)
Admission: EM | Admit: 2021-09-24 | Discharge: 2021-09-24 | Discharge status: Against Medical Advice | Payer: 59 | Attending: Emergency Medicine | Admitting: Emergency Medicine

## 2021-09-24 ENCOUNTER — Encounter (HOSPITAL_COMMUNITY): Payer: Self-pay | Admitting: Emergency Medicine

## 2021-09-24 DIAGNOSIS — R109 Unspecified abdominal pain: Secondary | ICD-10-CM | POA: Diagnosis present

## 2021-09-24 DIAGNOSIS — Z5321 Procedure and treatment not carried out due to patient leaving prior to being seen by health care provider: Secondary | ICD-10-CM | POA: Diagnosis not present

## 2021-09-24 DIAGNOSIS — R110 Nausea: Secondary | ICD-10-CM | POA: Diagnosis not present

## 2021-09-24 LAB — COMPREHENSIVE METABOLIC PANEL
ALT: 15 U/L (ref 0–44)
AST: 12 U/L — ABNORMAL LOW (ref 15–41)
Albumin: 3.9 g/dL (ref 3.5–5.0)
Alkaline Phosphatase: 62 U/L (ref 38–126)
Anion gap: 5 (ref 5–15)
BUN: 14 mg/dL (ref 6–20)
CO2: 25 mmol/L (ref 22–32)
Calcium: 8.9 mg/dL (ref 8.9–10.3)
Chloride: 106 mmol/L (ref 98–111)
Creatinine, Ser: 0.68 mg/dL (ref 0.44–1.00)
GFR, Estimated: >60 mL/min (ref >60)
Glucose, Bld: 80 mg/dL (ref 70–99)
Potassium: 3.7 mmol/L (ref 3.5–5.1)
Sodium: 136 mmol/L (ref 135–145)
Total Bilirubin: 0.2 mg/dL — ABNORMAL LOW (ref 0.3–1.2)
Total Protein: 7.4 g/dL (ref 6.5–8.1)

## 2021-09-24 LAB — CBC
HCT: 37.5 % (ref 36.0–46.0)
Hemoglobin: 11.8 g/dL — ABNORMAL LOW (ref 12.0–15.0)
MCH: 26.9 pg (ref 26.0–34.0)
MCHC: 31.5 g/dL (ref 30.0–36.0)
MCV: 85.6 fL (ref 80.0–100.0)
Platelets: 325 10*3/uL (ref 150–400)
RBC: 4.38 MIL/uL (ref 3.87–5.11)
RDW: 14.7 % (ref 11.5–15.5)
WBC: 6.6 10*3/uL (ref 4.0–10.5)
nRBC: 0 % (ref 0.0–0.2)

## 2021-09-24 LAB — URINALYSIS, ROUTINE W REFLEX MICROSCOPIC
Bilirubin Urine: NEGATIVE
Glucose, UA: NEGATIVE mg/dL
Ketones, ur: NEGATIVE mg/dL
Nitrite: NEGATIVE
Protein, ur: NEGATIVE mg/dL
Specific Gravity, Urine: 1.026 (ref 1.005–1.030)
pH: 5 (ref 5.0–8.0)

## 2021-09-24 LAB — POC URINE PREG, ED: Preg Test, Ur: NEGATIVE

## 2021-09-24 LAB — LIPASE, BLOOD: Lipase: 21 U/L (ref 11–51)

## 2021-09-24 NOTE — ED Triage Notes (Signed)
Pt presents with abdominal pain with nausea x 2 days.

## 2021-09-25 ENCOUNTER — Other Ambulatory Visit: Payer: Self-pay

## 2021-09-25 ENCOUNTER — Emergency Department (HOSPITAL_COMMUNITY)
Admission: EM | Admit: 2021-09-25 | Discharge: 2021-09-25 | Disposition: A | Discharge status: Home / Self Care | Payer: 59 | Attending: Emergency Medicine | Admitting: Emergency Medicine

## 2021-09-25 ENCOUNTER — Encounter (HOSPITAL_COMMUNITY): Payer: Self-pay

## 2021-09-25 DIAGNOSIS — N921 Excessive and frequent menstruation with irregular cycle: Secondary | ICD-10-CM

## 2021-09-25 DIAGNOSIS — R109 Unspecified abdominal pain: Secondary | ICD-10-CM

## 2021-09-25 DIAGNOSIS — R110 Nausea: Secondary | ICD-10-CM | POA: Insufficient documentation

## 2021-09-25 DIAGNOSIS — N939 Abnormal uterine and vaginal bleeding, unspecified: Secondary | ICD-10-CM

## 2021-09-25 DIAGNOSIS — D259 Leiomyoma of uterus, unspecified: Secondary | ICD-10-CM | POA: Insufficient documentation

## 2021-09-25 DIAGNOSIS — Z7901 Long term (current) use of anticoagulants: Secondary | ICD-10-CM | POA: Insufficient documentation

## 2021-09-25 LAB — CBC
HCT: 40.2 % (ref 36.0–46.0)
Hemoglobin: 12.6 g/dL (ref 12.0–15.0)
MCH: 26.9 pg (ref 26.0–34.0)
MCHC: 31.3 g/dL (ref 30.0–36.0)
MCV: 85.7 fL (ref 80.0–100.0)
Platelets: 304 10*3/uL (ref 150–400)
RBC: 4.69 MIL/uL (ref 3.87–5.11)
RDW: 14.7 % (ref 11.5–15.5)
WBC: 5.5 10*3/uL (ref 4.0–10.5)
nRBC: 0 % (ref 0.0–0.2)

## 2021-09-25 LAB — COMPREHENSIVE METABOLIC PANEL
ALT: 13 U/L (ref 0–44)
AST: 12 U/L — ABNORMAL LOW (ref 15–41)
Albumin: 4 g/dL (ref 3.5–5.0)
Alkaline Phosphatase: 60 U/L (ref 38–126)
Anion gap: 5 (ref 5–15)
BUN: 13 mg/dL (ref 6–20)
CO2: 26 mmol/L (ref 22–32)
Calcium: 9 mg/dL (ref 8.9–10.3)
Chloride: 106 mmol/L (ref 98–111)
Creatinine, Ser: 0.6 mg/dL (ref 0.44–1.00)
GFR, Estimated: >60 mL/min (ref >60)
Glucose, Bld: 78 mg/dL (ref 70–99)
Potassium: 3.8 mmol/L (ref 3.5–5.1)
Sodium: 137 mmol/L (ref 135–145)
Total Bilirubin: 0.3 mg/dL (ref 0.3–1.2)
Total Protein: 7.8 g/dL (ref 6.5–8.1)

## 2021-09-25 LAB — LIPASE, BLOOD: Lipase: 19 U/L (ref 11–51)

## 2021-09-25 MED ORDER — ONDANSETRON 4 MG PO TBDP: 4.0000 mg | ORAL_TABLET | Freq: Three times a day (TID) | ORAL | 0 refills | Status: DC | PRN

## 2021-09-25 MED ORDER — ACETAMINOPHEN 325 MG PO TABS
650.0000 mg | ORAL_TABLET | Freq: Once | ORAL | Status: AC
  Administered 2021-09-25: 650 mg via ORAL
  Filled 2021-09-25: qty 2

## 2021-09-25 MED ORDER — ACETAMINOPHEN ER 650 MG PO TBCR: 650.0000 mg | EXTENDED_RELEASE_TABLET | Freq: Three times a day (TID) | ORAL | 0 refills | Status: AC | PRN

## 2021-09-25 MED ORDER — IBUPROFEN 400 MG PO TABS
600.0000 mg | ORAL_TABLET | Freq: Once | ORAL | Status: AC
  Administered 2021-09-25: 600 mg via ORAL
  Filled 2021-09-25: qty 2

## 2021-09-25 MED ORDER — IBUPROFEN 600 MG PO TABS
600.0000 mg | ORAL_TABLET | Freq: Four times a day (QID) | ORAL | 0 refills | Status: AC | PRN
Start: 2021-09-25 — End: 2021-10-05

## 2021-09-25 NOTE — ED Triage Notes (Signed)
Pt c/o R side abdominal pain and nausea x2 days.  Pain score 10/10.  Pt has not taken anything for symptoms.  Denies GU symptoms.

## 2021-09-25 NOTE — ED Provider Notes (Signed)
[facility] EMERGENCY DEPARTMENT Provider Note   CSN: [id number] Arrival date & time: 09/25/21  1313     History {Add pertinent medical, surgical, social history, OB history to HPI:1} Chief Complaint  Patient presents with   Abdominal Pain   Nausea    Jessica Sanchez is a 36 y.o. female with chief complaint of right-sided abdominal pain with associated nausea over the last 2 days.  Has been menstruating for the last 3 to 4 days.  Came to the ED yesterday for same symptoms, but left without being seen after triage.  Has continued pain and nausea today.  Has not tried anything for pain relief.  Denies vomiting, diarrhea, constipation, fever, bloody/dark bowel movements, shortness of breath, chest pain, back pain, flank pain, lightheadedness/dizziness, urinary symptoms, or vaginal discharge.  Denies active vaginal bleeding in the last 12 hours.  Endorses increased episodes of bleeding over the past few months, sometimes 3x per month.  Hx of numerous fibroids, 14 of them were surgically removed a few years ago.  Has new uterine fibroids identified via OB/GYN and recent ultrasounds within the last month.  Is being closely followed by OB/GYN, who has recommended either symptom management with methods such as hormonal birth control to cease menstruation altogether or possible hysterectomy.  Patient has a follow-up appoint with OB/GYN on 10/01/2021, but stated that the tenderness caused her to come to the ED sooner.    The history is provided by the patient and medical records.  Abdominal Pain Associated symptoms: nausea       Home Medications Prior to Admission medications   Medication Sig Start Date End Date Taking? Authorizing Provider  acetaminophen (TYLENOL 8 HOUR) 650 MG CR tablet Take 1 tablet (650 mg total) by mouth every 8 (eight) hours as needed for up to 10 days for pain. 09/25/21 10/05/21 Yes Prince Rome, PA-C  ibuprofen (ADVIL) 600 MG tablet Take 1 tablet (600 mg total) by mouth  every 6 (six) hours as needed for up to 10 days. 09/25/21 10/05/21 Yes Prince Rome, PA-C  ondansetron (ZOFRAN-ODT) 4 MG disintegrating tablet Take 1 tablet (4 mg total) by mouth every 8 (eight) hours as needed for nausea or vomiting. 09/25/21  Yes Prince Rome, PA-C  albuterol (PROVENTIL HFA) 108 (90 Base) MCG/ACT inhaler Inhale 1-2 puffs into the lungs every 6 (six) hours as needed for wheezing or shortness of breath.     [provider]  cephALEXin (KEFLEX) 500 MG capsule Take 1 capsule (500 mg total) by mouth 4 (four) times daily. Patient not taking: Reported on 08/24/2021 07/29/21   Ripley Fraise, MD  megestrol (MEGACE) 40 MG tablet 3 tablets a day for 5 days, 2 tablets a day for 5 days then 1 tablet daily 07/10/21   Jessica Buff, MD  metroNIDAZOLE (FLAGYL) 500 MG tablet Take 1 tablet (500 mg total) by mouth 2 (two) times daily. Patient not taking: Reported on 08/24/2021 07/29/21   Jessica Buff, MD  Multiple Vitamins-Calcium (ONE-A-DAY WOMENS PO) Take 1 tablet by mouth daily.    [provider]  UNABLE TO FIND Med for heart-1.5 tabs daily    [provider]  apixaban (ELIQUIS) 5 MG TABS tablet Take 2 tablets ('10mg'$ ) twice daily for 7 days, then 1 tablet ('5mg'$ ) twice daily 04/20/19 04/20/19  Rodell Perna A, PA-C  levonorgestrel (MIRENA) 20 MCG/24HR IUD 1 each by Intrauterine route once.   06/25/20  [provider]      Allergies  Shellfish allergy, Sulfa antibiotics, and Adhesive [tape]    Review of Systems   Review of Systems  Gastrointestinal:  Positive for abdominal pain and nausea.  Genitourinary:  Positive for menstrual problem.   Physical Exam Updated Vital Signs BP 138/80 (BP Location: Left Arm)   Pulse 82   Temp 98.2 F (36.8 C) (Oral)   Resp 16   Ht '5\' 3"'$  (1.6 m)   Wt (!) 167.4 kg   LMP 09/21/2021   SpO2 99%   BMI 65.37 kg/m  Physical Exam Vitals and nursing note reviewed.  Constitutional:      General: She is not in  acute distress.    Appearance: Normal appearance. She is well-developed. She is obese. She is not ill-appearing or diaphoretic.  HENT:     Head: Normocephalic and atraumatic.  Eyes:     General: No scleral icterus.    Conjunctiva/sclera: Conjunctivae normal.  Cardiovascular:     Rate and Rhythm: Normal rate and regular rhythm.     Heart sounds: Normal heart sounds. No murmur heard. Pulmonary:     Effort: Pulmonary effort is normal. No respiratory distress.     Breath sounds: Normal breath sounds.  Chest:     Chest wall: No tenderness.  Abdominal:     General: Abdomen is protuberant. Bowel sounds are normal. There is no distension.     Palpations: Abdomen is soft.     Tenderness: There is abdominal tenderness in the suprapubic area. There is no right CVA tenderness, left CVA tenderness or guarding. Negative signs include Murphy's sign and McBurney's sign.     Comments: Mild tenderness of suprapubic and right middle abdomen.  No McBurney's point tenderness.  Bowel sounds normal.  Abdomen soft.  Musculoskeletal:        General: No swelling.     Cervical back: Neck supple. No rigidity or tenderness.     Right lower leg: No edema.     Left lower leg: No edema.  Skin:    General: Skin is warm and dry.     Capillary Refill: Capillary refill takes less than 2 seconds.     Coloration: Skin is not jaundiced or pale.     Findings: No bruising or erythema.  Neurological:     Mental Status: She is alert and oriented to person, place, and time.  Psychiatric:        Mood and Affect: Mood normal.    ED Results / Procedures / Treatments   Labs (all labs ordered are listed, but only abnormal results are displayed) Labs Reviewed  COMPREHENSIVE METABOLIC PANEL - Abnormal; Notable for the following components:      Result Value   AST 12 (*)    All other components within normal limits  LIPASE, BLOOD  CBC    EKG None  Radiology No results found.  Procedures Procedures  {Document  cardiac monitor, telemetry assessment procedure when appropriate:1}  Medications Ordered in ED Medications  acetaminophen (TYLENOL) tablet 650 mg (650 mg Oral Given 09/25/21 1910)  ibuprofen (ADVIL) tablet 600 mg (600 mg Oral Given 09/25/21 1909)    ED Course/ Medical Decision Making/ A&P                           Medical Decision Making Amount and/or Complexity of Data Reviewed External Data Reviewed: notes. Labs: ordered. Decision-making details documented in ED Course. Radiology: ordered and independent interpretation performed. Decision-making details documented in ED Course. ECG/medicine tests:  ordered and independent interpretation performed. Decision-making details documented in ED Course.  Risk OTC drugs. Prescription drug management.   36 y.o. female presents to the ED for concern of Abdominal Pain and Nausea   This involves an extensive number of treatment options, and is a complaint that carries with it a high risk of complications and morbidity.  The emergent differential diagnosis prior to evaluation includes, but is not limited to: ***  This is not an exhaustive differential.   Past Medical History / Co-morbidities / Social History: Hx of IUD placement/removal, SVT with prior ablation asked to, nonspecific chest pain, GERD, irregular menstrual cycles/menorrhagia, leiomyoma, former tobacco use  Additional History:  Internal and external records from outside source obtained and reviewed including ED visits, cardiology  Physical Exam: Physical exam performed. The pertinent findings include: Mild suprapubic and right middle abdominal tenderness.  Lab Tests: I ordered, and personally interpreted labs.  The pertinent results include:   CBC: Unremarkable CMP/BMP: Unremarkable Lipase: Unremarkable Urinalysis: Reviewed from yesterday without any significant changes, unremarkable with exception of large quantities of Hgb which may be explained by patient's recent/current  menstruation. Urine pregnancy: Reviewed from yesterday, which was negative.  Patient unable to provide more urine and has not had any recent intercourse since January.  Therefore I believe repeat urine pregnancy may be deferred at this time  Imaging Studies: I considered ordering abdominal imaging, but patient without red flag symptoms such as fever, hematochezia, hematemesis, distention, diaphoresis, pain that awakens from sleep, pulsatile mass, pain with walking, recent trauma, increasing pain, intractable vomiting, lightheadedness on standing, or ripping/tearing abdominal pain.  No right upper quadrant involvement suspected.  Patient said recent pelvic ultrasound imaging performed 08/24/2021 with OB/GYN which has been tracking the known and repeat leiomyomas the patient has been diagnosed with.  No other abnormalities on such imaging as reported per patient and recent notes.    08/24/2021 US Pelvic US Report: GYNECOLOGIC SONOGRAM     Jessica Sanchez is a 36 y.o. G1P0010 LMP 08/23/2021,She is here for a pelvic sonogram for menorrhagia,fibroids.   Uterus                      9.9 x 8.4 x 10 cm, Total uterine volume 433 cc,enlarged heterogeneous anteverted uterus with multiple fibroids,(#1) fundal subserosal fibroid 6.9 x 5.3 x 6.7 cm,(#2) anterior subserosal fibroid 7.1 x 7.5 x 6.8 cm,(#3) subserosal lower uterine segment right fibroid 4 x 3.7 x 3.5 cm   Endometrium          17.6 mm, symmetrical, limited view   Right ovary             2.5 x 2.4 x 3.2 cm, normal,limited view    Left ovary                2.6 x 3.4 x 1.8 cm, normal,limited view    No free fluid    Technician Comments:   PELVIC US TA/TV:enlarged heterogeneous anteverted uterus with multiple fibroids,(#1) fundal subserosal fibroid 6.9 x 5.3 x 6.7 cm,(#2) anterior subserosal fibroid 7.1 x 7.5 x 6.8 cm,(#3) subserosal lower uterine segment right fibroid 4 x 3.7 x 3.5 cm,EEC 17.6 mm (limited view),normal ovaries (limited view),no free  fluid,no pain during ultrasound   Chaperone [address] Heide Guile 08/24/2021 11:29 AM   Clinical Impression and recommendations:   I have reviewed the sonogram results above, combined with the patient's current clinical course, below are my impressions and any appropriate  recommendations for management based on the sonographic findings.   Uterus comparison study 10/21 uterus is about 50% larger with corresponding growth in fibroids Endometrium 17 mm on megestrol which is the reason for the thickening Ovaries: normal size shape and morphology  Jessica Sanchez 08/24/2021 11:44 AM    With these considerations, I believe imaging may be deferred at this time.  Medications: I ordered medication including Tylenol and ibuprofen for pain management.  Reevaluation of the patient after these medicines showed that the patient moderate improvement.  I have reviewed the patients home medicines and have made adjustments as needed  ED Course/Disposition: Pt well-appearing on exam.  ***Not suspicious of PID/TOA/ectopic/uterine pregnancy.  No recent sexual activity since January and without vaginal discharge.  Has followed up with OB/GYN as well since her last sexual encounter.  Not suspicious of STD.  Labs, physical exam, and history not suggestive of appendicitis, bowel obstruction, bowel perforation, pancreatitis, ischemic bowel, renal calculi, pyelonephritis, acute cystitis.  After consideration of the diagnostic results and the patient's encounter today, I feel that the emergency department workup does not suggest an emergent condition requiring admission or immediate intervention beyond what has been performed at this time.  The patient is safe for discharge and has been instructed to return immediately for worsening symptoms, change in symptoms or any other concerns.  Discussed course of treatment thoroughly with the patient, whom demonstrated understanding.  Patient in agreement and has no further  questions.  I discussed this case with my attending physician Dr. Melina Copa, who agreed with the proposed treatment course and cosigned this note including patient's presenting symptoms, physical exam, and planned diagnostics and interventions.  Attending physician stated agreement with plan or made changes to plan which were implemented.     This chart was dictated using voice recognition software.  Despite best efforts to proofread, errors can occur which can change the documentation meaning.   {Document critical care time when appropriate:1} {Document review of labs and clinical decision tools ie heart score, Chads2Vasc2 etc:1}  {Document your independent review of radiology images, and any outside records:1} {Document your discussion with family members, caretakers, and with consultants:1} {Document social determinants of health affecting pt's care:1} {Document your decision making why or why not admission, treatments were needed:1} Final Clinical Impression(s) / ED Diagnoses Final diagnoses:  Abdominal pain, unspecified abdominal location  Nausea  Menorrhagia with irregular cycle  Excessive vaginal bleeding  Uterine leiomyoma, unspecified location    Rx / DC Orders ED Discharge Orders          Ordered    ibuprofen (ADVIL) 600 MG tablet  Every 6 hours PRN        09/25/21 1903    acetaminophen (TYLENOL 8 HOUR) 650 MG CR tablet  Every 8 hours PRN        09/25/21 1903    ondansetron (ZOFRAN-ODT) 4 MG disintegrating tablet  Every 8 hours PRN        09/25/21 1924

## 2021-09-25 NOTE — Discharge Instructions (Addendum)
Continue your follow-up with your OB/GYN as planned next week for reevaluation and continued medical management.  3 prescriptions have been called in your pharmacy for pain relief.  The first is ibuprofen that may be taken once every 6-8 hours.  The second is Tylenol, which may be taken once every 8 hours.  The third is Zofran, which is an antinausea medication that dissolves under the tongue, which you may take every 6-8 hours as needed.  Also utilize heat therapy and plenty of rest.  Return to the ED for new or worsening symptoms as discussed.

## 2021-09-30 IMAGING — DX DG HAND COMPLETE 3+V*R*
3 series · 3 of 3 positions shown · non-contrast
Comparison: None.

CLINICAL DATA: Hand caught in car door

EXAM:
RIGHT HAND - COMPLETE 3+ VIEW

[hand pa]
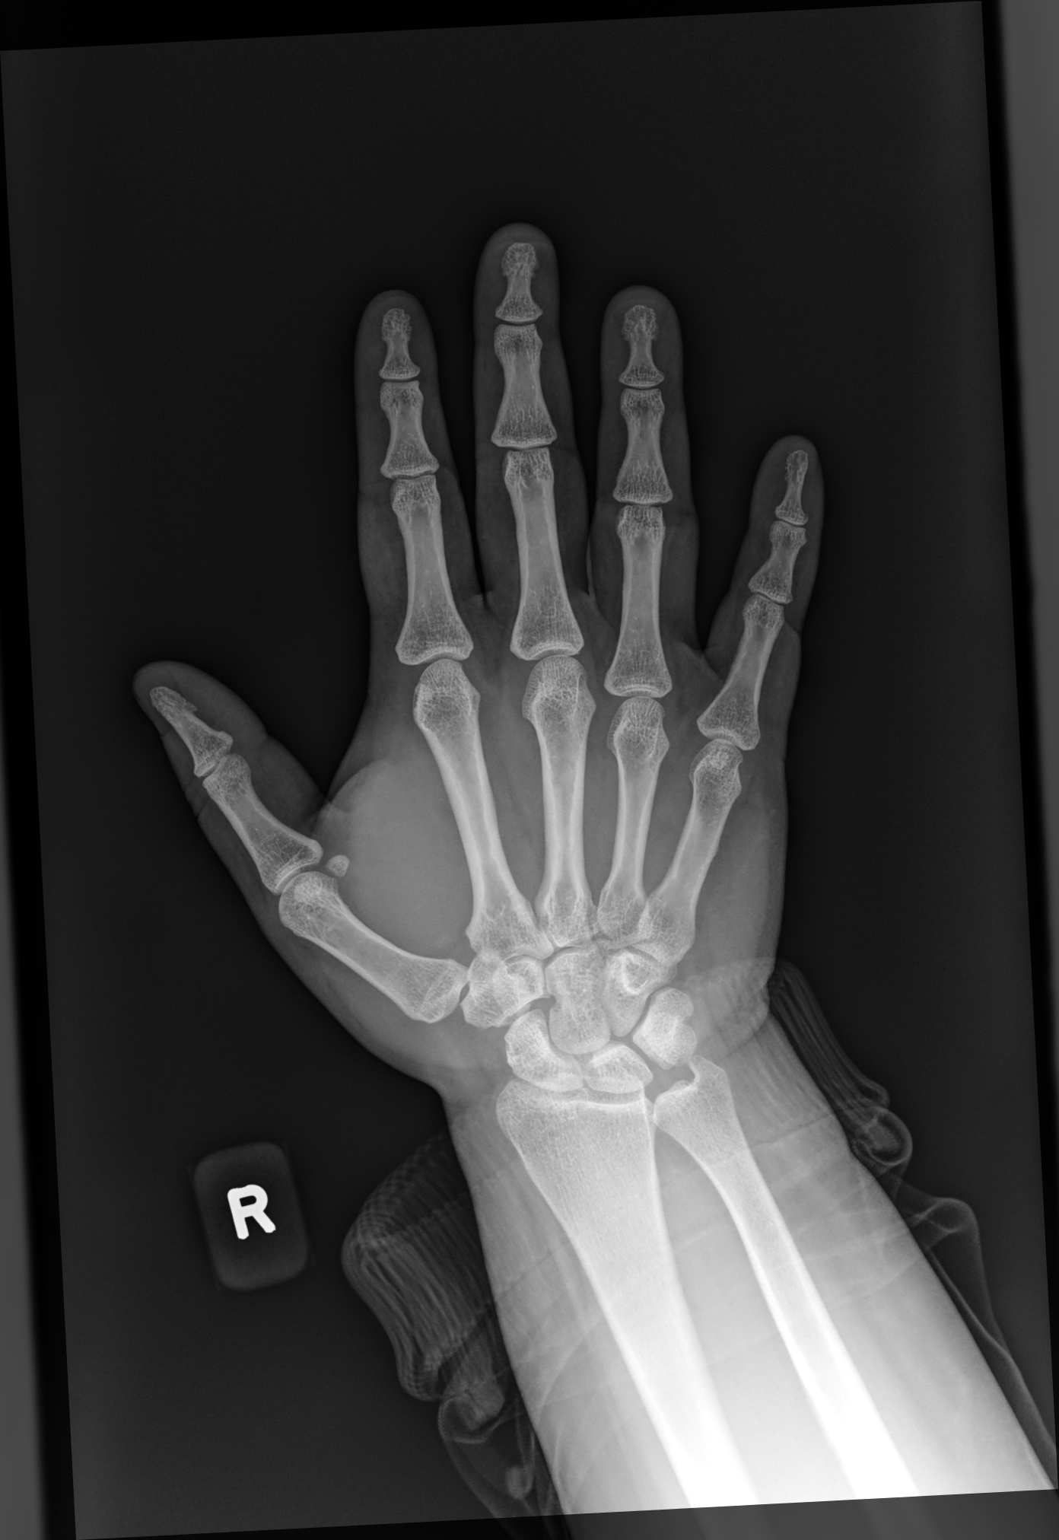

[hand mlo]
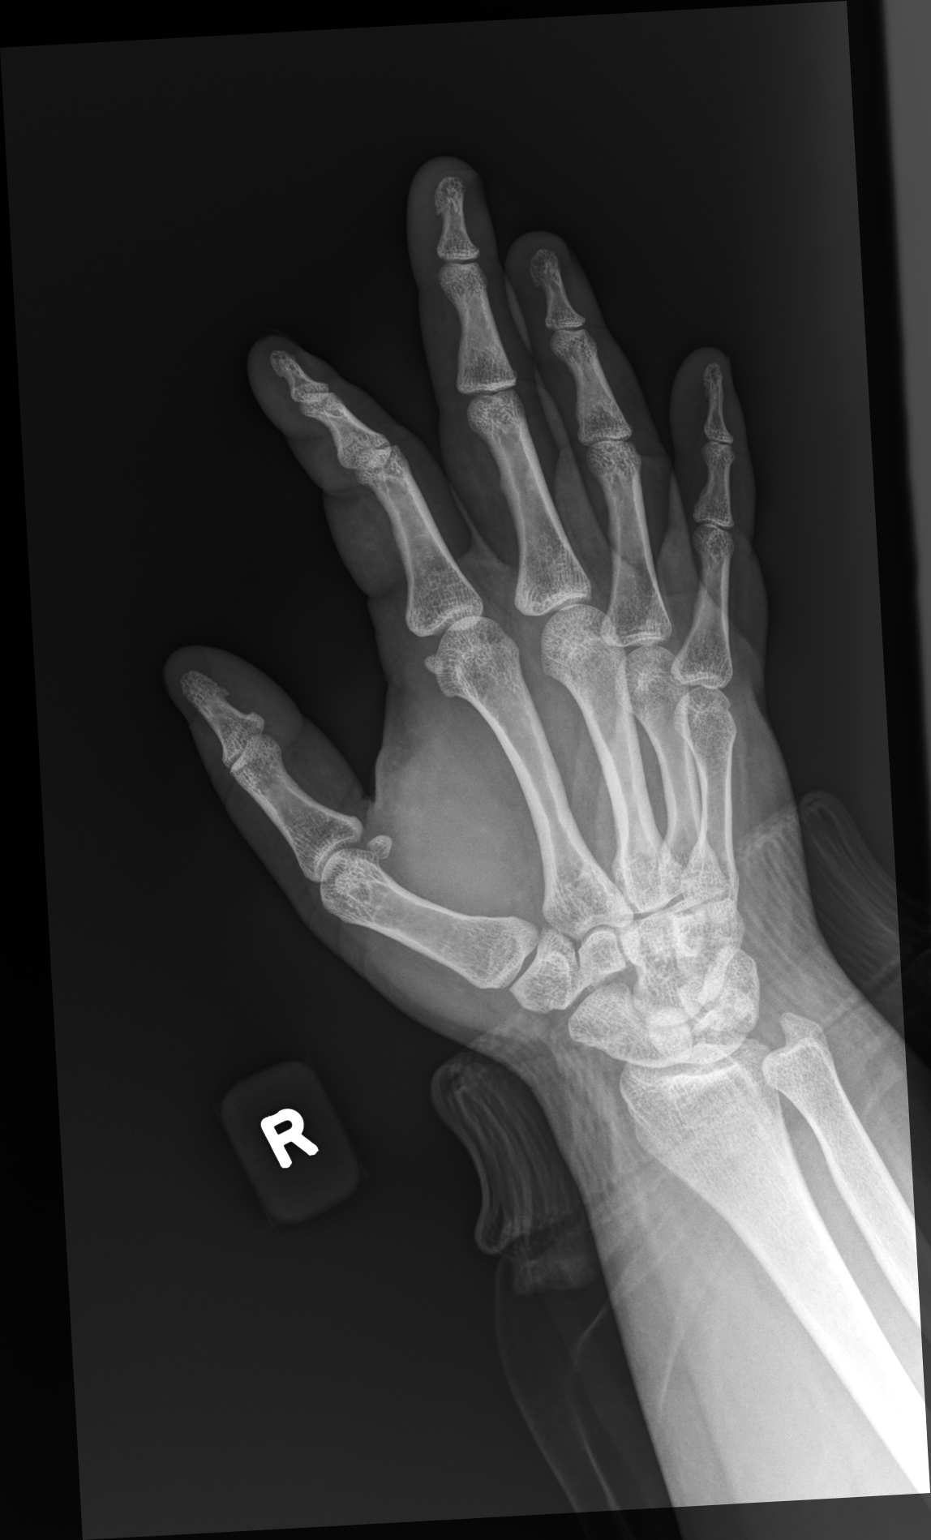

[hand lat]
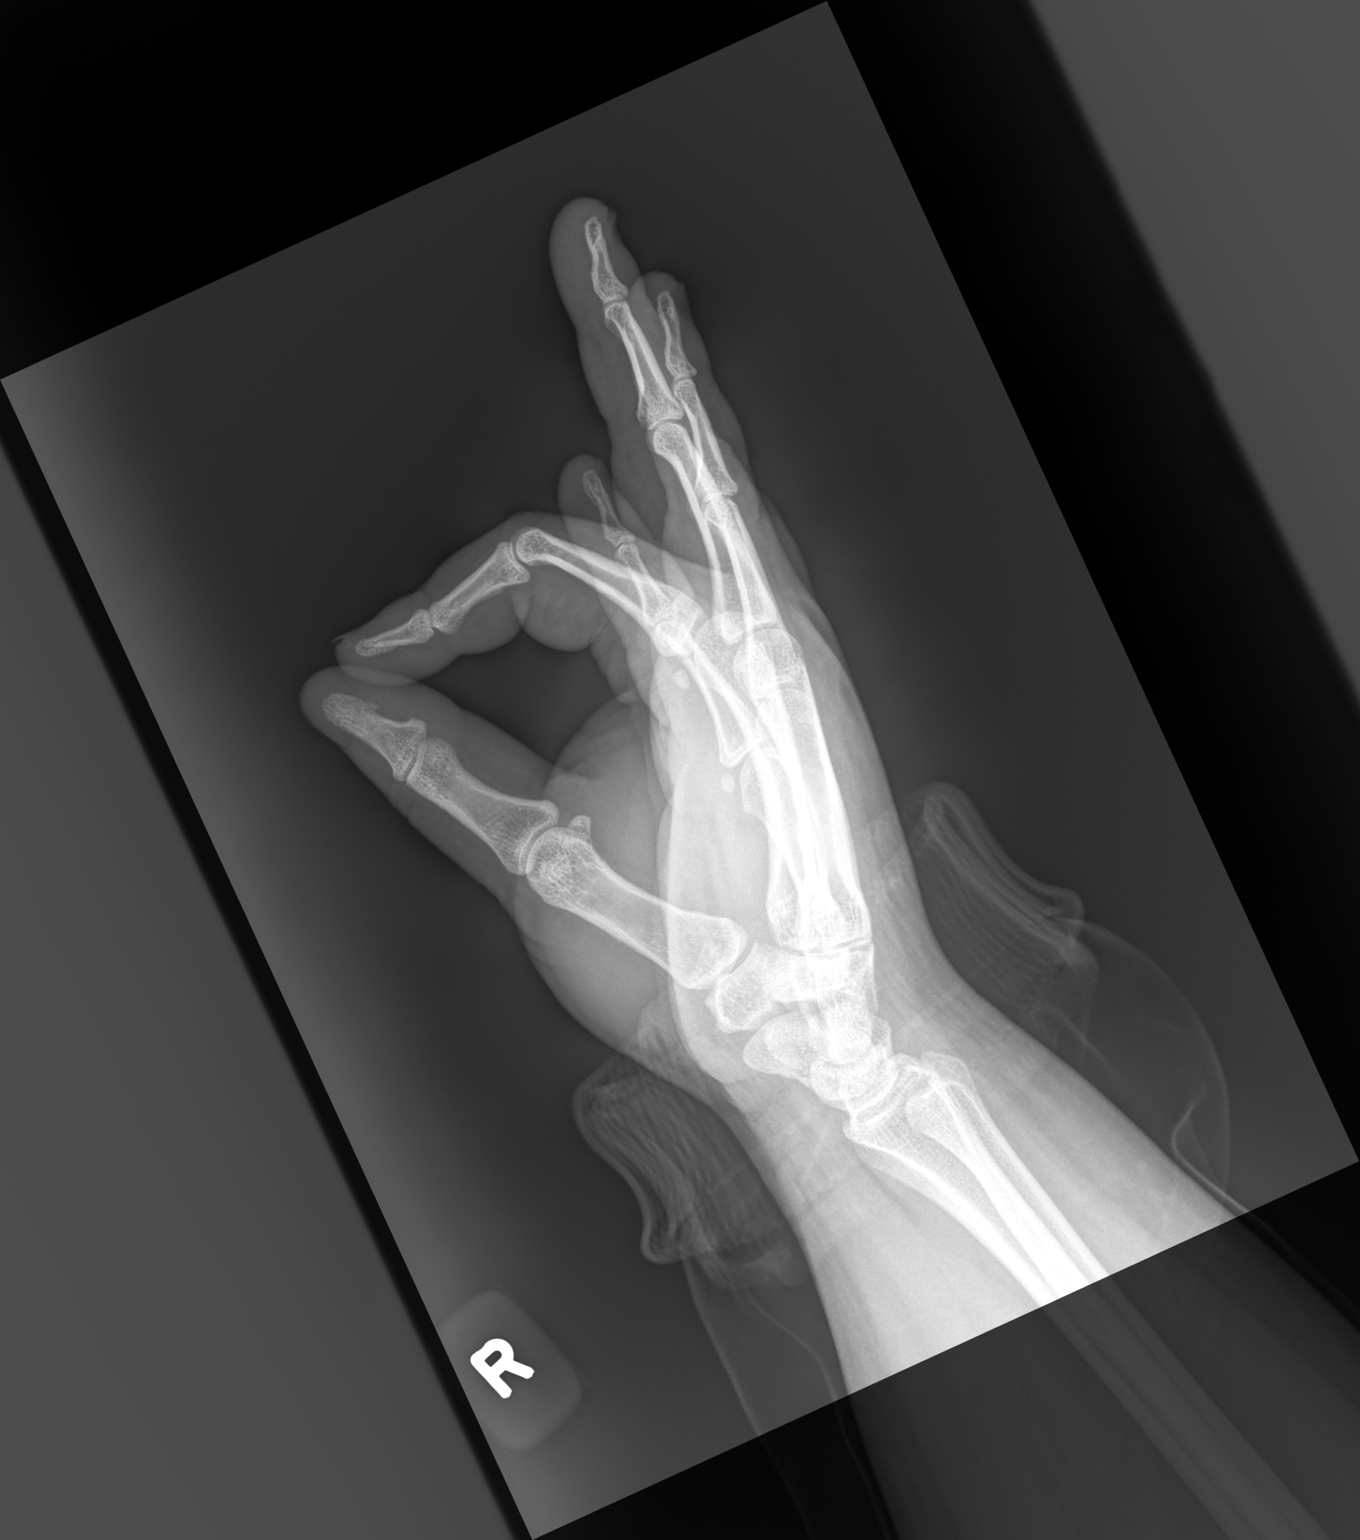

[3 of 3 positions shown; findings below may reference images not displayed]

FINDINGS: Frontal, oblique, and lateral views were obtained. No fracture or
dislocation. Joint spaces appear normal. No erosive change.
IMPRESSION: No fracture or dislocation.  No appreciable arthropathy.

## 2021-10-01 ENCOUNTER — Ambulatory Visit: Payer: 59 | Admitting: Obstetrics & Gynecology

## 2021-10-01 ENCOUNTER — Encounter: Payer: Self-pay | Admitting: Obstetrics & Gynecology

## 2021-10-01 VITALS — BP 134/80 | HR 86 | Ht 62.0 in | Wt 350.0 lb

## 2021-10-01 DIAGNOSIS — N938 Other specified abnormal uterine and vaginal bleeding: Secondary | ICD-10-CM | POA: Diagnosis not present

## 2021-10-01 DIAGNOSIS — D219 Benign neoplasm of connective and other soft tissue, unspecified: Secondary | ICD-10-CM | POA: Diagnosis not present

## 2021-10-01 MED ORDER — MYFEMBREE 40-1-0.5 MG PO TABS: 1.0000 | ORAL_TABLET | Freq: Every day | ORAL | 11 refills | Status: DC

## 2021-10-01 NOTE — Progress Notes (Signed)
Chief Complaint  Patient presents with   Follow-up   Menorrhagia      36 y.o. G1P0010 Patient's last menstrual period was 09/21/2021. The current method of family planning is oral progesterone-only contraceptive.  Outpatient Encounter Medications as of 10/01/2021  Medication Sig Note   acetaminophen (TYLENOL 8 HOUR) 650 MG CR tablet Take 1 tablet (650 mg total) by mouth every 8 (eight) hours as needed for up to 10 days for pain.    albuterol (PROVENTIL HFA) 108 (90 Base) MCG/ACT inhaler Inhale 1-2 puffs into the lungs every 6 (six) hours as needed for wheezing or shortness of breath.  03/17/2021: Pt needs new prescirption   ibuprofen (ADVIL) 600 MG tablet Take 1 tablet (600 mg total) by mouth every 6 (six) hours as needed for up to 10 days.    megestrol (MEGACE) 40 MG tablet 3 tablets a day for 5 days, 2 tablets a day for 5 days then 1 tablet daily    Multiple Vitamins-Calcium (ONE-A-DAY WOMENS PO) Take 1 tablet by mouth daily.    Relugolix-Estradiol-Norethind (MYFEMBREE) 40-1-0.5 MG TABS Take 1 tablet by mouth daily.    UNABLE TO FIND Med for heart-1.5 tabs daily    ondansetron (ZOFRAN-ODT) 4 MG disintegrating tablet Take 1 tablet (4 mg total) by mouth every 8 (eight) hours as needed for nausea or vomiting. (Patient not taking: Reported on 10/01/2021)    [DISCONTINUED] apixaban (ELIQUIS) 5 MG TABS tablet Take 2 tablets ('10mg'$ ) twice daily for 7 days, then 1 tablet ('5mg'$ ) twice daily    [DISCONTINUED] cephALEXin (KEFLEX) 500 MG capsule Take 1 capsule (500 mg total) by mouth 4 (four) times daily. (Patient not taking: Reported on 08/24/2021)    [DISCONTINUED] levonorgestrel (MIRENA) 20 MCG/24HR IUD 1 each by Intrauterine route once.  02/13/2020: Patient thought that she may have 2 IUDs. The new one was inserted in May, 2020   [DISCONTINUED] metroNIDAZOLE (FLAGYL) 500 MG tablet Take 1 tablet (500 mg total) by mouth 2 (two) times daily. (Patient not taking: Reported on 08/24/2021)    No  facility-administered encounter medications on file as of 10/01/2021.    Subjective Pt is still bleeding without synchrony Hemoglobin 12.6 On megestrol again Failed IUD x 2 Failed slynd Go back on megestrol   Add myfembree today, samples given Past Medical History:  Diagnosis Date   Arthritis    right shoulder   Dyspnea    Dysrhythmia    hx of SVT   Fibroid (bleeding) (uterine)    Fibroid, uterine    H/O metrorrhagia    Hypertension    IUD (intrauterine device) in place 07/11/2015   Menorrhagia    Obesity, Class III, BMI 40-49.9 (morbid obesity) ([place]) 11/11/2018   Sleep apnea    uses CPAP - setting 11   SVT (supraventricular tachycardia) ([place])     Past Surgical History:  Procedure Laterality Date   HYSTEROSCOPY N/A 09/17/2020   Procedure: HYSTEROSCOPY;  Surgeon: [name], MD;  Location: [facility];  Service: Gynecology;  Laterality: N/A;   HYSTEROSCOPY WITH D & C N/A 09/27/2013   Procedure: DILATATION AND CURETTAGE /HYSTEROSCOPY and insertion of Mirena IUD ;  Surgeon: [name]. Harrington Challenger, MD;  Location: [facility];  Service: Gynecology;  Laterality: N/A;   IUD REMOVAL N/A 09/17/2020   Procedure: INTRAUTERINE DEVICE (IUD) REMOVAL (2);  Surgeon: [name], MD;  Location: [facility];  Service: Gynecology;  Laterality: N/A;   MYOMECTOMY  02/03/2011   Procedure: MYOMECTOMY;  Surgeon:  [name], MD;  Location: [facility];  Service: Gynecology;  Laterality: N/A;   SVT ABLATION N/A 11/08/2018   Procedure: SVT ABLATION;  Surgeon: [name], MD;  Location: [facility];  Service: Cardiovascular;  Laterality: N/A;   SVT ABLATION N/A 12/26/2020   Procedure: SVT ABLATION;  Surgeon: [name], MD;  Location: [facility];  Service: Cardiovascular;  Laterality: N/A;   TONSILLECTOMY      OB History     Gravida  1   Para      Term      Preterm      AB  1   Living         SAB  1   IAB      Ectopic      Multiple      Live Births               Allergies  Allergen Reactions   Shellfish Allergy Shortness Of Breath, Itching and Swelling    Mouth became swollen, throat started itching, and developed shortness of breath   Sulfa Antibiotics Itching, Shortness Of Breath and Swelling    Mouth became swollen, throat started itching, and developed shortness of breath   Adhesive [Tape] Itching    Depends on the type of medical tape    Social History   Socioeconomic History   Marital status: Single    Spouse name: Not on file   Number of children: Not on file   Years of education: Not on file   Highest education level: Not on file  Occupational History   Not on file  Tobacco Use   Smoking status: Former    Types: Cigarettes   Smokeless tobacco: Never   Tobacco comments:    socially  Vaping Use   Vaping Use: Never used  Substance and Sexual Activity   Alcohol use: Yes    Comment: occasional   Drug use: No   Sexual activity: Yes    Birth control/protection: None, Condom  Other Topics Concern   Not on file  Social History Narrative   Not on file   Social Determinants of Health   Financial Resource Strain: Not on file  Food Insecurity: Not on file  Transportation Needs: Not on file  Physical Activity: Not on file  Stress: Not on file  Social Connections: Not on file    Family History  Problem Relation Age of Onset   Cirrhosis Mother    Diabetes Mother    Cancer Maternal Grandfather    Hypertension Sister    Diabetes Sister    Anesthesia problems Neg Hx    Hypotension Neg Hx    Malignant hyperthermia Neg Hx    Pseudochol deficiency Neg Hx     Medications:       Current Outpatient Medications:    acetaminophen (TYLENOL 8 HOUR) 650 MG CR tablet, Take 1 tablet (650 mg total) by mouth every 8 (eight) hours as needed for up to 10 days for pain., Disp: 30 tablet, Rfl: 0   albuterol (PROVENTIL HFA) 108 (90 Base) MCG/ACT inhaler, Inhale 1-2 puffs into the lungs every 6 (six) hours as needed for wheezing or  shortness of breath. , Disp: , Rfl:    ibuprofen (ADVIL) 600 MG tablet, Take 1 tablet (600 mg total) by mouth every 6 (six) hours as needed for up to 10 days., Disp: 30 tablet, Rfl: 0   megestrol (MEGACE) 40 MG tablet, 3 tablets a  day for 5 days, 2 tablets a day for 5 days then 1 tablet daily, Disp: 45 tablet, Rfl: 3   Multiple Vitamins-Calcium (ONE-A-DAY WOMENS PO), Take 1 tablet by mouth daily., Disp: , Rfl:    Relugolix-Estradiol-Norethind (MYFEMBREE) 40-1-0.5 MG TABS, Take 1 tablet by mouth daily., Disp: 30 tablet, Rfl: 11   UNABLE TO FIND, Med for heart-1.5 tabs daily, Disp: , Rfl:    ondansetron (ZOFRAN-ODT) 4 MG disintegrating tablet, Take 1 tablet (4 mg total) by mouth every 8 (eight) hours as needed for nausea or vomiting. (Patient not taking: Reported on 10/01/2021), Disp: 20 tablet, Rfl: 0  Objective Blood pressure 134/80, pulse 86, height '5\' 2"'$  (1.575 m), weight (!) 350 lb (158.8 kg), last menstrual period 09/21/2021.  Gen WDWN NAD  Pertinent ROS No burning with urination, frequency or urgency No nausea, vomiting or diarrhea Nor fever chills or other constitutional symptoms   Labs or studies 12.6 hemoglobin    Impression + Management Plan: Diagnoses this Encounter::   ICD-10-CM   1. DUB (dysfunctional uterine bleeding)  N93.8    restarted megestrol, add myfembree, samples given    2. Fibroid, 433 cc(50% increase over previous)  D21.9         Medications prescribed during  this encounter: Meds ordered this encounter  Medications   Relugolix-Estradiol-Norethind (MYFEMBREE) 40-1-0.5 MG TABS    Sig: Take 1 tablet by mouth daily.    Dispense:  30 tablet    Refill:  11    Labs or Scans Ordered during this encounter: No orders of the defined types were placed in this encounter.     Follow up Return in about 1 month (around 10/31/2021), or if symptoms worsen or fail to improve, for MyChart Connect visit, with Dr Elonda Husky.

## 2021-10-06 ENCOUNTER — Emergency Department (HOSPITAL_COMMUNITY)
Admission: EM | Admit: 2021-10-06 | Discharge: 2021-10-06 | Disposition: A | Discharge status: Home / Self Care | Payer: 59 | Attending: Emergency Medicine | Admitting: Emergency Medicine

## 2021-10-06 ENCOUNTER — Emergency Department (HOSPITAL_COMMUNITY): Payer: 59

## 2021-10-06 ENCOUNTER — Other Ambulatory Visit: Payer: Self-pay

## 2021-10-06 ENCOUNTER — Encounter (HOSPITAL_COMMUNITY): Payer: Self-pay | Admitting: Emergency Medicine

## 2021-10-06 DIAGNOSIS — S6992XA Unspecified injury of left wrist, hand and finger(s), initial encounter: Secondary | ICD-10-CM

## 2021-10-06 DIAGNOSIS — Z7901 Long term (current) use of anticoagulants: Secondary | ICD-10-CM | POA: Insufficient documentation

## 2021-10-06 DIAGNOSIS — S61305A Unspecified open wound of left ring finger with damage to nail, initial encounter: Secondary | ICD-10-CM | POA: Insufficient documentation

## 2021-10-06 DIAGNOSIS — W232XXA Caught, crushed, jammed or pinched between a moving and stationary object, initial encounter: Secondary | ICD-10-CM | POA: Insufficient documentation

## 2021-10-06 MED ORDER — HYDROCODONE-ACETAMINOPHEN 5-325 MG PO TABS
ORAL_TABLET | ORAL | 0 refills | Status: DC
Start: 2021-10-06 — End: 2021-11-02

## 2021-10-06 NOTE — ED Notes (Signed)
Pt verbalized understanding of no driving and to use caution within 4 hours of taking pain meds due to meds cause drowsiness 

## 2021-10-06 NOTE — ED Triage Notes (Signed)
Pt injured ring finger on left hand.

## 2021-10-06 NOTE — Discharge Instructions (Signed)
The x-ray of your finger today did not show evidence of any broken bones or dislocations.  I recommend that you elevate your hand when possible to help reduce swelling.  Keep the finger splinted while the nail area heals.  Avoid wearing artificial nails and to your finger heals completely.  You may follow-up with orthopedics in 1 to 2 weeks if needed.

## 2021-10-06 NOTE — ED Provider Notes (Signed)
[facility] EMERGENCY DEPARTMENT Provider Note   CSN: [id number] Arrival date & time: 10/06/21  1519     History  Chief Complaint  Patient presents with   Finger Injury    Jessica Sanchez is a 36 y.o. female.  HPI      Jessica Sanchez is a 36 y.o. female who presents to the Emergency Department complaining of injury of her distal left ring finger.  Injury occurred earlier today.  She was wearing artificial nails when her finger was caught on something causing the artificial nail to "bend backwards."  She complains of tenderness to the distal and of her fingernail area.  Pain associated with touch and with movement of the finger.  She denies any pain of the proximal finger or hand.  No wrist pain.  No numbness of the extremity.  No active bleeding   Home Medications Prior to Admission medications   Medication Sig Start Date End Date Taking? Authorizing Provider  HYDROcodone-acetaminophen (NORCO/VICODIN) 5-325 MG tablet Take one tab po q 4 hrs prn pain 10/06/21  Yes Keiarra Charon, PA-C  albuterol (PROVENTIL HFA) 108 (90 Base) MCG/ACT inhaler Inhale 1-2 puffs into the lungs every 6 (six) hours as needed for wheezing or shortness of breath.     [provider]  megestrol (MEGACE) 40 MG tablet 3 tablets a day for 5 days, 2 tablets a day for 5 days then 1 tablet daily 07/10/21   [name], MD  Multiple Vitamins-Calcium (ONE-A-DAY WOMENS PO) Take 1 tablet by mouth daily.    [provider]  ondansetron (ZOFRAN-ODT) 4 MG disintegrating tablet Take 1 tablet (4 mg total) by mouth every 8 (eight) hours as needed for nausea or vomiting. Patient not taking: Reported on 10/01/2021 4/50/38   Prince Rome, PA-C  Relugolix-Estradiol-Norethind (MYFEMBREE) 40-1-0.5 MG TABS Take 1 tablet by mouth daily. 10/01/21   [name], MD  UNABLE TO FIND Med for heart-1.5 tabs daily    [provider]  apixaban (ELIQUIS) 5 MG TABS tablet Take 2 tablets ('10mg'$ ) twice daily for 7  days, then 1 tablet ('5mg'$ ) twice daily 04/20/19 04/20/19  Rodell Perna A, PA-C  levonorgestrel (MIRENA) 20 MCG/24HR IUD 1 each by Intrauterine route once.   06/25/20  [provider]      Allergies    Shellfish allergy, Sulfa antibiotics, and Adhesive [tape]    Review of Systems   Review of Systems  Musculoskeletal:  Positive for arthralgias. Negative for joint swelling.       Pain to the distal left ring finger.  Skin:  Negative for wound.  Neurological:  Negative for weakness and numbness.   Physical Exam Updated Vital Signs BP (!) 144/77 (BP Location: Right Arm)   Pulse 81   Temp 97.6 F (36.4 C) (Oral)   Resp 16   Ht '5\' 2"'$  (1.575 m)   Wt (!) 158.8 kg   LMP 09/21/2021   SpO2 98%   BMI 64.03 kg/m  Physical Exam Vitals and nursing note reviewed.  Constitutional:      General: She is not in acute distress.    Appearance: Normal appearance.  Cardiovascular:     Rate and Rhythm: Normal rate and regular rhythm.     Pulses: Normal pulses.  Pulmonary:     Effort: Pulmonary effort is normal.  Musculoskeletal:        General: Tenderness and signs of injury present. Normal range of motion.     Left hand: Tenderness present.  No swelling or lacerations. Normal range of motion. Normal sensation. There is no disruption of two-point discrimination. Normal capillary refill. Normal pulse.     Comments: Tenderness to palpation at the distal left ring finger.  A slight avulsion of the distal tip of the fingernail from the nailbed, but mid and proximal nail is intact.  No obvious subungual hematoma.  No active bleeding.  No tenderness of the DIP joint.  Skin:    General: Skin is warm.     Capillary Refill: Capillary refill takes less than 2 seconds.  Neurological:     General: No focal deficit present.     Mental Status: She is alert.    ED Results / Procedures / Treatments   Labs (all labs ordered are listed, but only abnormal results are displayed) Labs Reviewed - No data to  display  EKG None  Radiology DG Hand Complete Left  Result Date: 10/06/2021 CLINICAL DATA:  Trauma, pain EXAM: LEFT HAND - COMPLETE 3+ VIEW COMPARISON:  Left index finger study done on 03/18/2012 FINDINGS: There is no evidence of fracture or dislocation. There is no evidence of arthropathy or other focal bone abnormality. Soft tissues are unremarkable. IMPRESSION: No fracture or dislocation is seen in the left hand. Electronically Signed   By: Elmer Picker M.D.   On: 10/06/2021 16:05    Procedures Procedures    Medications Ordered in ED Medications - No data to display  ED Course/ Medical Decision Making/ A&P                           Medical Decision Making Amount and/or Complexity of Data Reviewed Radiology: ordered.  Risk Prescription drug management.   Patient here for evaluation of injury to the ring finger of the left hand.  She was wearing artificial nails and nail was caught on something causing a slight avulsion of her nail from the nailbed.  This only involves the very distal portion of the nail.  No active bleeding or subungual hematoma.  X-ray of the hand without evidence of bony injury.I agree with radiology interpretation Patient agreeable to symptomatic treatment with elevation, Ice, and finger splint applied for protection and support.         Final Clinical Impression(s) / ED Diagnoses Final diagnoses:  Injury of nail bed of finger of left hand, initial encounter    Rx / DC Orders ED Discharge Orders          Ordered    HYDROcodone-acetaminophen (NORCO/VICODIN) 5-325 MG tablet        10/06/21 1638              Kem Parkinson, PA-C 10/06/21 1724    Luna Fuse, MD 10/19/21 1616

## 2021-10-08 ENCOUNTER — Encounter (HOSPITAL_COMMUNITY): Payer: Self-pay | Admitting: Emergency Medicine

## 2021-10-08 ENCOUNTER — Ambulatory Visit
Admission: EM | Admit: 2021-10-08 | Discharge: 2021-10-08 | Disposition: A | Discharge status: Nursing Home (Includes ICF) | Payer: 59

## 2021-10-08 ENCOUNTER — Emergency Department (HOSPITAL_COMMUNITY): Payer: 59

## 2021-10-08 ENCOUNTER — Other Ambulatory Visit: Payer: Self-pay

## 2021-10-08 ENCOUNTER — Emergency Department (HOSPITAL_COMMUNITY)
Admission: EM | Admit: 2021-10-08 | Discharge: 2021-10-08 | Disposition: A | Discharge status: Home / Self Care | Payer: 59 | Attending: Emergency Medicine | Admitting: Emergency Medicine

## 2021-10-08 DIAGNOSIS — H532 Diplopia: Secondary | ICD-10-CM | POA: Diagnosis not present

## 2021-10-08 DIAGNOSIS — Z7901 Long term (current) use of anticoagulants: Secondary | ICD-10-CM | POA: Diagnosis not present

## 2021-10-08 DIAGNOSIS — R202 Paresthesia of skin: Secondary | ICD-10-CM | POA: Diagnosis not present

## 2021-10-08 DIAGNOSIS — R42 Dizziness and giddiness: Secondary | ICD-10-CM | POA: Insufficient documentation

## 2021-10-08 DIAGNOSIS — R519 Headache, unspecified: Secondary | ICD-10-CM | POA: Diagnosis not present

## 2021-10-08 LAB — CBC WITH DIFFERENTIAL/PLATELET
Abs Immature Granulocytes: 0.03 10*3/uL (ref 0.00–0.07)
Basophils Absolute: 0 10*3/uL (ref 0.0–0.1)
Basophils Relative: 0 %
Eosinophils Absolute: 0.1 10*3/uL (ref 0.0–0.5)
Eosinophils Relative: 2 %
HCT: 34.9 % — ABNORMAL LOW (ref 36.0–46.0)
Hemoglobin: 11.1 g/dL — ABNORMAL LOW (ref 12.0–15.0)
Immature Granulocytes: 1 %
Lymphocytes Relative: 38 %
Lymphs Abs: 2 10*3/uL (ref 0.7–4.0)
MCH: 26.9 pg (ref 26.0–34.0)
MCHC: 31.8 g/dL (ref 30.0–36.0)
MCV: 84.7 fL (ref 80.0–100.0)
Monocytes Absolute: 0.5 10*3/uL (ref 0.1–1.0)
Monocytes Relative: 10 %
Neutro Abs: 2.6 10*3/uL (ref 1.7–7.7)
Neutrophils Relative %: 49 %
Platelets: 264 10*3/uL (ref 150–400)
RBC: 4.12 MIL/uL (ref 3.87–5.11)
RDW: 14.9 % (ref 11.5–15.5)
WBC: 5.3 10*3/uL (ref 4.0–10.5)
nRBC: 0 % (ref 0.0–0.2)

## 2021-10-08 LAB — COMPREHENSIVE METABOLIC PANEL
ALT: 16 U/L (ref 0–44)
AST: 13 U/L — ABNORMAL LOW (ref 15–41)
Albumin: 3.6 g/dL (ref 3.5–5.0)
Alkaline Phosphatase: 57 U/L (ref 38–126)
Anion gap: 3 — ABNORMAL LOW (ref 5–15)
BUN: 12 mg/dL (ref 6–20)
CO2: 24 mmol/L (ref 22–32)
Calcium: 8.6 mg/dL — ABNORMAL LOW (ref 8.9–10.3)
Chloride: 107 mmol/L (ref 98–111)
Creatinine, Ser: 0.59 mg/dL (ref 0.44–1.00)
GFR, Estimated: >60 mL/min (ref >60)
Glucose, Bld: 84 mg/dL (ref 70–99)
Potassium: 3.8 mmol/L (ref 3.5–5.1)
Sodium: 134 mmol/L — ABNORMAL LOW (ref 135–145)
Total Bilirubin: 0.8 mg/dL (ref 0.3–1.2)
Total Protein: 7.2 g/dL (ref 6.5–8.1)

## 2021-10-08 LAB — CBG MONITORING, ED: Glucose-Capillary: 77 mg/dL (ref 70–99)

## 2021-10-08 MED ORDER — KETOROLAC TROMETHAMINE 15 MG/ML IJ SOLN
15.0000 mg | Freq: Once | INTRAMUSCULAR | Status: AC
  Administered 2021-10-08: 15 mg via INTRAVENOUS
  Filled 2021-10-08: qty 1

## 2021-10-08 MED ORDER — PROCHLORPERAZINE EDISYLATE 10 MG/2ML IJ SOLN
10.0000 mg | Freq: Once | INTRAMUSCULAR | Status: AC
  Administered 2021-10-08: 10 mg via INTRAVENOUS
  Filled 2021-10-08: qty 2

## 2021-10-08 MED ORDER — SODIUM CHLORIDE 0.9 % IV BOLUS
1000.0000 mL | Freq: Once | INTRAVENOUS | Status: AC
  Administered 2021-10-08: 1000 mL via INTRAVENOUS

## 2021-10-08 MED ORDER — DIPHENHYDRAMINE HCL 50 MG/ML IJ SOLN
25.0000 mg | Freq: Once | INTRAMUSCULAR | Status: AC
  Administered 2021-10-08: 25 mg via INTRAVENOUS
  Filled 2021-10-08: qty 1

## 2021-10-08 NOTE — ED Triage Notes (Signed)
Pt having severe headache with dizziness and blurred vision.

## 2021-10-08 NOTE — ED Triage Notes (Addendum)
Pt reports headache, lightheaded, neck stiff, fatigue. double vision since 1400 today. Pt states she felt lie she pass out at work.

## 2021-10-08 NOTE — Discharge Instructions (Signed)
You are leaving [place].  It was recommended to get a CT scan and potentially other imaging and work-up/treatment for your headache and double vision.  If at any point you change your mind or if you develop headache that worsens or does not go away, vomiting, vision changes, fever, neck stiffness, weakness or numbness in extremities, or any other new/concerning symptoms then return to the ER for evaluation or call 911

## 2021-10-08 NOTE — ED Notes (Addendum)
Pt panicked during CT scan and no longer wants to have CT done. Asked about urine sample, pt unable to provide at this time.

## 2021-10-08 NOTE — ED Notes (Signed)
Patient is being discharged from the Urgent Care and sent to the Emergency Department via POV . Per PA, patient is in need of higher level of care due to dizziness/lightheaded/blurred vision. Patient is aware and verbalizes understanding of plan of care.  Vitals:   10/08/21 1645  BP: 135/85  Pulse: 92  Resp: 20  Temp: 97.7 F (36.5 C)  SpO2: 96%

## 2021-10-08 NOTE — ED Provider Notes (Signed)
[facility] EMERGENCY DEPARTMENT Provider Note   CSN: [id number] Arrival date & time: 10/08/21  1740     History  Chief Complaint  Patient presents with   Headache    Jessica Sanchez is a 36 y.o. female.  HPI 36 year old female presents with headache.  She states when she woke up this morning she had a slight headache but did not think too much about it.  At around 2:30 PM she all of a sudden developed headache and dizziness.  She is also had double vision.  There is no ocular pain.  The headache is diffuse and feels like a pressure like something is popping inside of her.  Some nausea but no vomiting.  No focal weakness or numbness though she did have a transient right arm tingling.  No fevers noted.  Headache has been severe ever since 2:30. Feels like her neck is stiff. When clarifying, she notes it hurts her head when she moves her neck.   Home Medications Prior to Admission medications   Medication Sig Start Date End Date Taking? Authorizing Provider  albuterol (PROVENTIL HFA) 108 (90 Base) MCG/ACT inhaler Inhale 1-2 puffs into the lungs every 6 (six) hours as needed for wheezing or shortness of breath.     [provider]  HYDROcodone-acetaminophen (NORCO/VICODIN) 5-325 MG tablet Take one tab po q 4 hrs prn pain 10/06/21   Triplett, Tammy, PA-C  megestrol (MEGACE) 40 MG tablet 3 tablets a day for 5 days, 2 tablets a day for 5 days then 1 tablet daily 07/10/21   [name], MD  Multiple Vitamins-Calcium (ONE-A-DAY WOMENS PO) Take 1 tablet by mouth daily.    [provider]  ondansetron (ZOFRAN-ODT) 4 MG disintegrating tablet Take 1 tablet (4 mg total) by mouth every 8 (eight) hours as needed for nausea or vomiting. Patient not taking: Reported on 10/01/2021 9/70/26   Prince Rome, PA-C  Relugolix-Estradiol-Norethind (MYFEMBREE) 40-1-0.5 MG TABS Take 1 tablet by mouth daily. 10/01/21   [name], MD  UNABLE TO FIND Med for heart-1.5 tabs daily pt can not  remember the name.    [provider]  apixaban (ELIQUIS) 5 MG TABS tablet Take 2 tablets ('10mg'$ ) twice daily for 7 days, then 1 tablet ('5mg'$ ) twice daily 04/20/19 04/20/19  Rodell Perna A, PA-C  levonorgestrel (MIRENA) 20 MCG/24HR IUD 1 each by Intrauterine route once.   06/25/20  [provider]      Allergies    Shellfish allergy, Sulfa antibiotics, and Adhesive [tape]    Review of Systems   Review of Systems  Constitutional:  Negative for fever.  Eyes:  Positive for photophobia and visual disturbance. Negative for pain.  Neurological:  Positive for dizziness and headaches. Negative for weakness.    Physical Exam Updated Vital Signs BP (!) 135/108   Pulse 93   Temp 98 F (36.7 C) (Oral)   Resp 17   Ht '5\' 2"'$  (1.575 m)   Wt (!) 158.8 kg   LMP 09/27/2021 (Exact Date)   SpO2 99%   BMI 64.03 kg/m  Physical Exam Vitals and nursing note reviewed.  Constitutional:      General: She is not in acute distress.    Appearance: She is well-developed. She is obese. She is not ill-appearing or diaphoretic.  HENT:     Head: Normocephalic and atraumatic.  Eyes:     Extraocular Movements: Extraocular movements intact.     Pupils: Pupils are equal, round, and reactive to  light.  Cardiovascular:     Rate and Rhythm: Normal rate and regular rhythm.     Heart sounds: Normal heart sounds.  Pulmonary:     Effort: Pulmonary effort is normal.     Breath sounds: Normal breath sounds.  Abdominal:     Palpations: Abdomen is soft.     Tenderness: There is no abdominal tenderness.  Musculoskeletal:     Cervical back: Normal range of motion and neck supple. No rigidity.  Skin:    General: Skin is warm and dry.  Neurological:     Mental Status: She is alert.     Comments: CN 3-12 grossly intact. 5/5 strength in all 4 extremities. Grossly normal sensation. Normal finger to nose.      ED Results / Procedures / Treatments   Labs (all labs ordered are listed, but only abnormal  results are displayed) Labs Reviewed  COMPREHENSIVE METABOLIC PANEL - Abnormal; Notable for the following components:      Result Value   Sodium 134 (*)    Calcium 8.6 (*)    AST 13 (*)    Anion gap 3 (*)    All other components within normal limits  CBC WITH DIFFERENTIAL/PLATELET - Abnormal; Notable for the following components:   Hemoglobin 11.1 (*)    HCT 34.9 (*)    All other components within normal limits  PREGNANCY, URINE  CBG MONITORING, ED    EKG None  Radiology No results found.  Procedures Procedures    Medications Ordered in ED Medications  sodium chloride 0.9 % bolus 1,000 mL (0 mLs Intravenous Stopped 10/08/21 2150)  prochlorperazine (COMPAZINE) injection 10 mg (10 mg Intravenous Given 10/08/21 1946)  diphenhydrAMINE (BENADRYL) injection 25 mg (25 mg Intravenous Given 10/08/21 1945)  ketorolac (TORADOL) 15 MG/ML injection 15 mg (15 mg Intravenous Given 10/08/21 2116)    ED Course/ Medical Decision Making/ A&P                           Medical Decision Making Amount and/or Complexity of Data Reviewed Labs: ordered. Radiology: ordered.  Risk Prescription drug management.   Patient was given IV Compazine and Benadryl as well as fluids to help with her headache.  She reports some partial relief with this.  She reports a stiff neck but has no meningismus on exam.  Good range of motion.  She reports her double vision is also improving.  I have recommended a head CT and was considering a CT venogram as well given the visual complaints.  However, she try to get a CT and then states that she was too claustrophobic to lay flat and get her head through there.  Chart review shows she has had numerous CTs for different things before.  I offered her an anxiolytic to get a CT done but she still declines.  She wants to leave.  I discussed that while she may have a benign cause of headache, with the visual complaints there is concern for multiple CNS emergencies and this could  result in deterioration and/or death.  She seems understand this.  I will give her a dose of Toradol as she reports her pain is still present but not all the way gone.  Otherwise, I discussed she may return at any time is encouraged to do so.  I will refer her to outpatient neurology.        Final Clinical Impression(s) / ED Diagnoses Final diagnoses:  Bad headache  Diplopia    Rx / DC Orders ED Discharge Orders     None         Sherwood Gambler, MD 10/08/21 2347

## 2021-10-12 NOTE — ED Provider Notes (Signed)
[facility]    CSN: [id number] Arrival date & time: 10/08/21  1635      History   Chief Complaint Chief Complaint  Patient presents with   Headache   Dizziness    HPI Jessica Sanchez is a 36 y.o. female.   Presenting today with new onset severe headache, lightheadedness, dizziness, neck stiffness, fatigue and now double vision for the past hour or 2 today.  She states she feels like she is going to pass out at any minute.  Had to leave work due to this constellation of symptoms.  She denies head injury, fever, chills, nausea, vomiting, chest pain, shortness of breath, extremity numbness or tingling, weakness.  Has not yet tried anything over-the-counter for symptoms.  No past history of neurologic issues.    Past Medical History:  Diagnosis Date   Arthritis    right shoulder   Dyspnea    Dysrhythmia    hx of SVT   Fibroid (bleeding) (uterine)    Fibroid, uterine    H/O metrorrhagia    Hypertension    IUD (intrauterine device) in place 07/11/2015   Menorrhagia    Obesity, Class III, BMI 40-49.9 (morbid obesity) ([place]) 11/11/2018   Sleep apnea    uses CPAP - setting 11   SVT (supraventricular tachycardia) ([place])     Patient Active Problem List   Diagnosis Date Noted   Encounter for IUD removal    Menorrhagia 09/04/2020   Elevated troponin    Attempted IUD removal, unsuccessful 07/05/2019   Encounter for IUD insertion 07/05/2019   Gastroesophageal reflux disease    Nonspecific chest pain 11/11/2018   Morbid obesity with body mass index (BMI) of 60.0 to 69.9 in adult [facility]) 11/11/2018   Elevated d-dimer 11/11/2018   SVT (supraventricular tachycardia) ([place]) 10/04/2018   IUD (intrauterine device) in place 07/11/2015   Other disorder of menstruation and other abnormal bleeding from female genital tract 10/30/2012    Past Surgical History:  Procedure Laterality Date   HYSTEROSCOPY N/A 09/17/2020   Procedure: HYSTEROSCOPY;  Surgeon: [name], MD;   Location: [facility];  Service: Gynecology;  Laterality: N/A;   HYSTEROSCOPY WITH D & C N/A 09/27/2013   Procedure: DILATATION AND CURETTAGE /HYSTEROSCOPY and insertion of Mirena IUD ;  Surgeon: [name]. Harrington Challenger, MD;  Location: [facility];  Service: Gynecology;  Laterality: N/A;   IUD REMOVAL N/A 09/17/2020   Procedure: INTRAUTERINE DEVICE (IUD) REMOVAL (2);  Surgeon: [name], MD;  Location: [facility];  Service: Gynecology;  Laterality: N/A;   MYOMECTOMY  02/03/2011   Procedure: MYOMECTOMY;  Surgeon: [name], MD;  Location: [facility];  Service: Gynecology;  Laterality: N/A;   SVT ABLATION N/A 11/08/2018   Procedure: SVT ABLATION;  Surgeon: [name], MD;  Location: [facility];  Service: Cardiovascular;  Laterality: N/A;   SVT ABLATION N/A 12/26/2020   Procedure: SVT ABLATION;  Surgeon: [name], MD;  Location: [place];  Service: Cardiovascular;  Laterality: N/A;   TONSILLECTOMY      OB History     Gravida  1   Para      Term      Preterm      AB  1   Living         SAB  1   IAB      Ectopic      Multiple      Live Births  Home Medications    Prior to Admission medications   Medication Sig Start Date End Date Taking? Authorizing Provider  albuterol (PROVENTIL HFA) 108 (90 Base) MCG/ACT inhaler Inhale 1-2 puffs into the lungs every 6 (six) hours as needed for wheezing or shortness of breath.     [provider]  HYDROcodone-acetaminophen (NORCO/VICODIN) 5-325 MG tablet Take one tab po q 4 hrs prn pain 10/06/21   Triplett, Tammy, PA-C  megestrol (MEGACE) 40 MG tablet 3 tablets a day for 5 days, 2 tablets a day for 5 days then 1 tablet daily 07/10/21   [name], MD  Multiple Vitamins-Calcium (ONE-A-DAY WOMENS PO) Take 1 tablet by mouth daily.    [provider]  ondansetron (ZOFRAN-ODT) 4 MG disintegrating tablet Take 1 tablet (4 mg total) by mouth every 8 (eight) hours as needed for nausea or  vomiting. Patient not taking: Reported on 10/01/2021 3/41/93   Prince Rome, PA-C  Relugolix-Estradiol-Norethind (MYFEMBREE) 40-1-0.5 MG TABS Take 1 tablet by mouth daily. 10/01/21   [name], MD  UNABLE TO FIND Med for heart-1.5 tabs daily pt can not remember the name.    [provider]  apixaban (ELIQUIS) 5 MG TABS tablet Take 2 tablets ('10mg'$ ) twice daily for 7 days, then 1 tablet ('5mg'$ ) twice daily 04/20/19 04/20/19  Rodell Perna A, PA-C  levonorgestrel (MIRENA) 20 MCG/24HR IUD 1 each by Intrauterine route once.   06/25/20  [provider]    Family History Family History  Problem Relation Age of Onset   Cirrhosis Mother    Diabetes Mother    Cancer Maternal Grandfather    Hypertension Sister    Diabetes Sister    Anesthesia problems Neg Hx    Hypotension Neg Hx    Malignant hyperthermia Neg Hx    Pseudochol deficiency Neg Hx     Social History Social History   Tobacco Use   Smoking status: Former    Types: Cigarettes   Smokeless tobacco: Never   Tobacco comments:    socially  Media planner   Vaping Use: Never used  Substance Use Topics   Alcohol use: Yes    Comment: occasional   Drug use: No     Allergies   Shellfish allergy, Sulfa antibiotics, and Adhesive [tape]   Review of Systems Review of Systems Per HPI  Physical Exam Triage Vital Signs ED Triage Vitals  Enc Vitals Group     BP 10/08/21 1645 135/85     Pulse Rate 10/08/21 1645 92     Resp 10/08/21 1645 20     Temp 10/08/21 1645 97.7 F (36.5 C)     Temp Source 10/08/21 1645 Oral     SpO2 10/08/21 1645 96 %     Weight --      Height --      Head Circumference --      Peak Flow --      Pain Score 10/08/21 1646 10     Pain Loc --      Pain Edu? --      Excl. in [place]? --    No data found.  Updated Vital Signs BP 135/85 (BP Location: Right Wrist)   Pulse 92   Temp 97.7 F (36.5 C) (Oral)   Resp 20   LMP 09/27/2021 (Exact Date)   SpO2 96%   Visual Acuity Right Eye  Distance:   Left Eye Distance:   Bilateral Distance:    Right Eye Near:  Left Eye Near:    Bilateral Near:      Exam significantly abbreviated as the decision was already made to go to the emergency department for further evaluation given the concerning symptoms Physical Exam Vitals and nursing note reviewed.  Constitutional:      Comments: Appears fatigued  HENT:     Head: Atraumatic.  Eyes:     Extraocular Movements: Extraocular movements intact.     Conjunctiva/sclera: Conjunctivae normal.     Pupils: Pupils are equal, round, and reactive to light.  Cardiovascular:     Rate and Rhythm: Normal rate and regular rhythm.     Heart sounds: Normal heart sounds.  Pulmonary:     Effort: Pulmonary effort is normal.     Breath sounds: Normal breath sounds.  Musculoskeletal:        General: Normal range of motion.     Cervical back: Normal range of motion and neck supple.  Skin:    General: Skin is warm and dry.  Neurological:     Mental Status: She is alert.     Comments: Brief neurologic exam appears grossly intact  Psychiatric:        Mood and Affect: Mood normal.        Thought Content: Thought content normal.        Judgment: Judgment normal.      UC Treatments / Results  Labs (all labs ordered are listed, but only abnormal results are displayed) Labs Reviewed - No data to display  EKG   Radiology No results found.  Procedures Procedures (including critical care time)  Medications Ordered in UC Medications - No data to display  Initial Impression / Assessment and Plan / UC Course  I have reviewed the triage vital signs and the nursing notes.  Pertinent labs & imaging results that were available during my care of the patient were reviewed by me and considered in my medical decision making (see chart for details).     Vital signs and very brief exam overall fairly reassuring, however given the nature of her complaints feel that she would benefit from  further work-up in the emergency department to ensure no life-threatening cause of symptoms.  Patient calling someone to transport her to the emergency department as her ride left shortly after dropping her off.  She declines EMS transport to the emergency department at this time.  Family member arrived and transported to the emergency department private vehicle.  She is hemodynamically stable for transport at this time.  Final Clinical Impressions(s) / UC Diagnoses   Final diagnoses:  Severe headache  Double vision  Dizziness   Discharge Instructions   None    ED Prescriptions   None    PDMP not reviewed this encounter.   Merrie Roof [place], [place] 10/12/21 [phone]

## 2021-10-19 ENCOUNTER — Telehealth: Payer: Self-pay | Admitting: Obstetrics & Gynecology

## 2021-10-19 NOTE — Telephone Encounter (Signed)
Patient calling stating that she is still have her period twice in a mouth that the medicine that was scripted to her is not working.patient is wanting to know what else can be done.

## 2021-10-19 NOTE — Telephone Encounter (Signed)
Patient states she has started her second period for this month today.  She is taking the Myfembree as prescribed and is unsure if she needs to be seen sooner than her appointment scheduled for July 3.  Please advise.

## 2021-10-20 ENCOUNTER — Ambulatory Visit
Admission: EM | Admit: 2021-10-20 | Discharge: 2021-10-20 | Disposition: A | Discharge status: Home / Self Care | Payer: 59 | Attending: Nurse Practitioner | Admitting: Nurse Practitioner

## 2021-10-20 DIAGNOSIS — J329 Chronic sinusitis, unspecified: Secondary | ICD-10-CM | POA: Diagnosis not present

## 2021-10-20 DIAGNOSIS — J029 Acute pharyngitis, unspecified: Secondary | ICD-10-CM | POA: Diagnosis not present

## 2021-10-20 DIAGNOSIS — B9689 Other specified bacterial agents as the cause of diseases classified elsewhere: Secondary | ICD-10-CM

## 2021-10-20 LAB — POCT RAPID STREP A (OFFICE): Rapid Strep A Screen: NEGATIVE

## 2021-10-20 MED ORDER — AMOXICILLIN-POT CLAVULANATE 875-125 MG PO TABS: 1.0000 | ORAL_TABLET | Freq: Two times a day (BID) | ORAL | 0 refills | Status: DC

## 2021-10-20 NOTE — Discharge Instructions (Signed)
-   Please start the Augmentin to treat the sinus infection  - Use Mucinex 600 mg twice daily and increase water intake to 64 oz daily to help thin secretions - Start neti pot, saline nasal rinses, and steam showers to help the congestion come out  - Seek care if your symptoms persist or worsen despite treatment

## 2021-10-20 NOTE — Telephone Encounter (Signed)
Understood but OR schedule is such wouldn't help to see her prior than she is already scheduled

## 2021-10-20 NOTE — Telephone Encounter (Signed)
FYI>   Returned call to patient. States she woke up with heavy bleeding and clots and had to throw away clothes..  She is wanting a hysterectomy at this point.

## 2021-10-20 NOTE — ED Triage Notes (Signed)
Pt reports sore throat, ear pain, headache, weak, stuffy nose x 1 week. OTC meds gives no relief.

## 2021-10-20 NOTE — Telephone Encounter (Signed)
No wont really cahnge anything just keep that appt as scheduled

## 2021-10-20 NOTE — ED Provider Notes (Signed)
[facility]    CSN: [id number] Arrival date & time: 10/20/21  1157      History   Chief Complaint Chief Complaint  Patient presents with   Sore Throat        Otalgia        Headache   Nasal Congestion         HPI Jessica Sanchez is a 36 y.o. female.   Patient presents with 1 week of cough, congestion, weakness, fatigue, chest pain with coughing, shortness of breath with coughing, sinus pressure on the left side, headache, sore throat, ear pain.  She denies abdominal pain, nausea/vomiting, diarrhea.  Reports her appetite has been decreased.  Denies any new rash.  Reports initially, her symptoms seemed to be improving, however they significantly worsen the next day.  She has been taking over the counter medication including Mucinex, Alka-Seltzer plus without relief of symptoms.    Past Medical History:  Diagnosis Date   Arthritis    right shoulder   Dyspnea    Dysrhythmia    hx of SVT   Fibroid (bleeding) (uterine)    Fibroid, uterine    H/O metrorrhagia    Hypertension    IUD (intrauterine device) in place 07/11/2015   Menorrhagia    Obesity, Class III, BMI 40-49.9 (morbid obesity) ([place]) 11/11/2018   Sleep apnea    uses CPAP - setting 11   SVT (supraventricular tachycardia) ([place])     Patient Active Problem List   Diagnosis Date Noted   Encounter for IUD removal    Menorrhagia 09/04/2020   Elevated troponin    Attempted IUD removal, unsuccessful 07/05/2019   Encounter for IUD insertion 07/05/2019   Gastroesophageal reflux disease    Nonspecific chest pain 11/11/2018   Morbid obesity with body mass index (BMI) of 60.0 to 69.9 in adult [facility]) 11/11/2018   Elevated d-dimer 11/11/2018   SVT (supraventricular tachycardia) ([place]) 10/04/2018   IUD (intrauterine device) in place 07/11/2015   Other disorder of menstruation and other abnormal bleeding from female genital tract 10/30/2012    Past Surgical History:  Procedure Laterality Date    HYSTEROSCOPY N/A 09/17/2020   Procedure: HYSTEROSCOPY;  Surgeon: [name], MD;  Location: [facility];  Service: Gynecology;  Laterality: N/A;   HYSTEROSCOPY WITH D & C N/A 09/27/2013   Procedure: DILATATION AND CURETTAGE /HYSTEROSCOPY and insertion of Mirena IUD ;  Surgeon: [name]. Harrington Challenger, MD;  Location: [facility];  Service: Gynecology;  Laterality: N/A;   IUD REMOVAL N/A 09/17/2020   Procedure: INTRAUTERINE DEVICE (IUD) REMOVAL (2);  Surgeon: [name], MD;  Location: [facility];  Service: Gynecology;  Laterality: N/A;   MYOMECTOMY  02/03/2011   Procedure: MYOMECTOMY;  Surgeon: [name], MD;  Location: [facility];  Service: Gynecology;  Laterality: N/A;   SVT ABLATION N/A 11/08/2018   Procedure: SVT ABLATION;  Surgeon: [name], MD;  Location: [facility];  Service: Cardiovascular;  Laterality: N/A;   SVT ABLATION N/A 12/26/2020   Procedure: SVT ABLATION;  Surgeon: [name], MD;  Location: [facility];  Service: Cardiovascular;  Laterality: N/A;   TONSILLECTOMY      OB History     Gravida  1   Para      Term      Preterm      AB  1   Living         SAB  1   IAB  Ectopic      Multiple      Live Births               Home Medications    Prior to Admission medications   Medication Sig Start Date End Date Taking? Authorizing Provider  amoxicillin-clavulanate (AUGMENTIN) 875-125 MG tablet Take 1 tablet by mouth 2 (two) times daily for 7 days. 10/20/21 10/27/21 Yes Eulogio Bear, NP  albuterol (PROVENTIL HFA) 108 (90 Base) MCG/ACT inhaler Inhale 1-2 puffs into the lungs every 6 (six) hours as needed for wheezing or shortness of breath.     [provider]  HYDROcodone-acetaminophen (NORCO/VICODIN) 5-325 MG tablet Take one tab po q 4 hrs prn pain 10/06/21   Triplett, Tammy, PA-C  megestrol (MEGACE) 40 MG tablet 3 tablets a day for 5 days, 2 tablets a day for 5 days then 1 tablet daily 07/10/21   [name], MD  Multiple  Vitamins-Calcium (ONE-A-DAY WOMENS PO) Take 1 tablet by mouth daily.    [provider]  ondansetron (ZOFRAN-ODT) 4 MG disintegrating tablet Take 1 tablet (4 mg total) by mouth every 8 (eight) hours as needed for nausea or vomiting. Patient not taking: Reported on 10/01/2021 08/25/93   Prince Rome, PA-C  Relugolix-Estradiol-Norethind (MYFEMBREE) 40-1-0.5 MG TABS Take 1 tablet by mouth daily. 10/01/21   [name], MD  UNABLE TO FIND Med for heart-1.5 tabs daily pt can not remember the name.    [provider]  apixaban (ELIQUIS) 5 MG TABS tablet Take 2 tablets ('10mg'$ ) twice daily for 7 days, then 1 tablet ('5mg'$ ) twice daily 04/20/19 04/20/19  Rodell Perna A, PA-C  levonorgestrel (MIRENA) 20 MCG/24HR IUD 1 each by Intrauterine route once.   06/25/20  [provider]    Family History Family History  Problem Relation Age of Onset   Cirrhosis Mother    Diabetes Mother    Cancer Maternal Grandfather    Hypertension Sister    Diabetes Sister    Anesthesia problems Neg Hx    Hypotension Neg Hx    Malignant hyperthermia Neg Hx    Pseudochol deficiency Neg Hx     Social History Social History   Tobacco Use   Smoking status: Former    Types: Cigarettes   Smokeless tobacco: Never   Tobacco comments:    socially  Media planner   Vaping Use: Never used  Substance Use Topics   Alcohol use: Yes    Comment: occasional   Drug use: No     Allergies   Shellfish allergy, Sulfa antibiotics, and Adhesive [tape]   Review of Systems Review of Systems Per HPI  Physical Exam Triage Vital Signs ED Triage Vitals  Enc Vitals Group     BP 10/20/21 1208 (!) 148/93     Pulse Rate 10/20/21 1208 99     Resp 10/20/21 1208 (!) 24     Temp 10/20/21 1208 99 F (37.2 C)     Temp Source 10/20/21 1208 Oral     SpO2 10/20/21 1208 95 %     Weight --      Height --      Head Circumference --      Peak Flow --      Pain Score 10/20/21 1209 10     Pain Loc --      Pain  Edu? --      Excl. in [place]? --    No data found.  Updated Vital Signs BP Marland Kitchen)  148/93 (BP Location: Right Arm)   Pulse 99   Temp 99 F (37.2 C) (Oral)   Resp (!) 24   LMP 10/19/2021 (Exact Date)   SpO2 95%   Visual Acuity Right Eye Distance:   Left Eye Distance:   Bilateral Distance:    Right Eye Near:   Left Eye Near:    Bilateral Near:     Physical Exam Vitals and nursing note reviewed.  Constitutional:      General: She is not in acute distress.    Appearance: Normal appearance. She is not ill-appearing or toxic-appearing.  HENT:     Head: Normocephalic and atraumatic.     Right Ear: Tympanic membrane, ear canal and external ear normal.     Left Ear: Ear canal and external ear normal. A middle ear effusion is present. Tympanic membrane is not injected, perforated or erythematous.     Nose: Congestion and rhinorrhea present.     Right Sinus: No maxillary sinus tenderness or frontal sinus tenderness.     Left Sinus: Maxillary sinus tenderness and frontal sinus tenderness present.     Mouth/Throat:     Mouth: Mucous membranes are moist.     Pharynx: Oropharynx is clear. Posterior oropharyngeal erythema present. No oropharyngeal exudate.  Eyes:     General: No scleral icterus.    Extraocular Movements: Extraocular movements intact.  Cardiovascular:     Rate and Rhythm: Normal rate and regular rhythm.  Pulmonary:     Effort: Pulmonary effort is normal. No respiratory distress.     Breath sounds: Normal breath sounds. No wheezing, rhonchi or rales.  Abdominal:     General: There is no distension.  Musculoskeletal:     Cervical back: Normal range of motion and neck supple.  Lymphadenopathy:     Cervical: Cervical adenopathy present.  Skin:    General: Skin is warm and dry.     Coloration: Skin is not jaundiced or pale.     Findings: Erythema present. No rash.  Neurological:     Mental Status: She is alert and oriented to person, place, and time.  Psychiatric:         Behavior: Behavior is cooperative.      UC Treatments / Results  Labs (all labs ordered are listed, but only abnormal results are displayed) Labs Reviewed  CULTURE, GROUP A STREP [facility])  POCT RAPID STREP A (OFFICE)    EKG   Radiology No results found.  Procedures Procedures (including critical care time)  Medications Ordered in UC Medications - No data to display  Initial Impression / Assessment and Plan / UC Course  I have reviewed the triage vital signs and the nursing notes.  Pertinent labs & imaging results that were available during my care of the patient were reviewed by me and considered in my medical decision making (see chart for details).    Symptoms are consistent with bacterial sinusitis.  Rapid strep throat test today is negative.  Treat with Augmentin twice daily for 7 days.  Continue Mucinex twice daily, increase water intake, saline nasal rinses, humidifier, steam showers.  Seek care if symptoms persist or worsen over the next 7 to 10 days.  Note given for work that she may return; given the length of her symptoms, she is likely no longer contagious.  Final Clinical Impressions(s) / UC Diagnoses   Final diagnoses:  Sore throat  Bacterial sinusitis     Discharge Instructions      - Please start the  Augmentin to treat the sinus infection  - Use Mucinex 600 mg twice daily and increase water intake to 64 oz daily to help thin secretions - Start neti pot, saline nasal rinses, and steam showers to help the congestion come out  - Seek care if your symptoms persist or worsen despite treatment    ED Prescriptions     Medication Sig Dispense Auth. Provider   amoxicillin-clavulanate (AUGMENTIN) 875-125 MG tablet Take 1 tablet by mouth 2 (two) times daily for 7 days. 14 tablet Eulogio Bear, NP      PDMP not reviewed this encounter.   Eulogio Bear, NP 10/20/21 1236

## 2021-10-23 ENCOUNTER — Emergency Department (HOSPITAL_COMMUNITY)
Admission: EM | Admit: 2021-10-23 | Discharge: 2021-10-23 | Disposition: A | Discharge status: Home / Self Care | Payer: 59 | Attending: Emergency Medicine | Admitting: Emergency Medicine

## 2021-10-23 ENCOUNTER — Other Ambulatory Visit: Payer: Self-pay

## 2021-10-23 ENCOUNTER — Encounter (HOSPITAL_COMMUNITY): Payer: Self-pay

## 2021-10-23 DIAGNOSIS — Z20822 Contact with and (suspected) exposure to covid-19: Secondary | ICD-10-CM | POA: Insufficient documentation

## 2021-10-23 DIAGNOSIS — Z7901 Long term (current) use of anticoagulants: Secondary | ICD-10-CM | POA: Insufficient documentation

## 2021-10-23 DIAGNOSIS — J0100 Acute maxillary sinusitis, unspecified: Secondary | ICD-10-CM | POA: Insufficient documentation

## 2021-10-23 DIAGNOSIS — H65192 Other acute nonsuppurative otitis media, left ear: Secondary | ICD-10-CM | POA: Diagnosis not present

## 2021-10-23 DIAGNOSIS — R059 Cough, unspecified: Secondary | ICD-10-CM | POA: Diagnosis present

## 2021-10-23 DIAGNOSIS — H65112 Acute and subacute allergic otitis media (mucoid) (sanguinous) (serous), left ear: Secondary | ICD-10-CM

## 2021-10-23 LAB — CULTURE, GROUP A STREP (THRC)

## 2021-10-23 LAB — GROUP A STREP BY PCR: Group A Strep by PCR: NOT DETECTED

## 2021-10-23 LAB — SARS CORONAVIRUS 2 BY RT PCR: SARS Coronavirus 2 by RT PCR: NEGATIVE

## 2021-10-23 MED ORDER — CETIRIZINE-PSEUDOEPHEDRINE ER 5-120 MG PO TB12: 1.0000 | ORAL_TABLET | Freq: Two times a day (BID) | ORAL | 0 refills | Status: DC

## 2021-10-23 MED ORDER — CEFIXIME 400 MG PO CAPS: 400.0000 mg | ORAL_CAPSULE | Freq: Every day | ORAL | 0 refills | Status: DC

## 2021-10-23 NOTE — ED Triage Notes (Signed)
Pt presents to ED with complaints of generalized body aches, sore throat, cough started Tuesday.

## 2021-10-26 ENCOUNTER — Encounter: Payer: Self-pay | Admitting: Family Medicine

## 2021-10-26 ENCOUNTER — Ambulatory Visit (INDEPENDENT_AMBULATORY_CARE_PROVIDER_SITE_OTHER): Payer: 59 | Admitting: Family Medicine

## 2021-10-26 VITALS — BP 130/83 | HR 87 | Ht 62.0 in | Wt 352.0 lb

## 2021-10-26 DIAGNOSIS — E559 Vitamin D deficiency, unspecified: Secondary | ICD-10-CM

## 2021-10-26 DIAGNOSIS — T7840XA Allergy, unspecified, initial encounter: Secondary | ICD-10-CM

## 2021-10-26 DIAGNOSIS — R0602 Shortness of breath: Secondary | ICD-10-CM | POA: Diagnosis not present

## 2021-10-26 DIAGNOSIS — Z1159 Encounter for screening for other viral diseases: Secondary | ICD-10-CM

## 2021-10-26 DIAGNOSIS — Z23 Encounter for immunization: Secondary | ICD-10-CM

## 2021-10-26 DIAGNOSIS — N921 Excessive and frequent menstruation with irregular cycle: Secondary | ICD-10-CM

## 2021-10-26 DIAGNOSIS — R609 Edema, unspecified: Secondary | ICD-10-CM | POA: Diagnosis not present

## 2021-10-26 DIAGNOSIS — Z6841 Body Mass Index (BMI) 40.0 and over, adult: Secondary | ICD-10-CM

## 2021-10-26 DIAGNOSIS — H65112 Acute and subacute allergic otitis media (mucoid) (sanguinous) (serous), left ear: Secondary | ICD-10-CM | POA: Diagnosis not present

## 2021-10-26 DIAGNOSIS — H6692 Otitis media, unspecified, left ear: Secondary | ICD-10-CM | POA: Insufficient documentation

## 2021-10-26 DIAGNOSIS — I872 Venous insufficiency (chronic) (peripheral): Secondary | ICD-10-CM | POA: Insufficient documentation

## 2021-10-26 DIAGNOSIS — H6592 Unspecified nonsuppurative otitis media, left ear: Secondary | ICD-10-CM | POA: Insufficient documentation

## 2021-10-26 MED ORDER — HYDROCHLOROTHIAZIDE 12.5 MG PO CAPS: 12.5000 mg | ORAL_CAPSULE | Freq: Every day | ORAL | 0 refills | Status: DC

## 2021-10-26 MED ORDER — AZITHROMYCIN 250 MG PO TABS: 500.0000 mg | ORAL_TABLET | Freq: Every day | ORAL | 0 refills | Status: AC

## 2021-10-26 MED ORDER — ALBUTEROL SULFATE HFA 108 (90 BASE) MCG/ACT IN AERS: 2.0000 | INHALATION_SPRAY | Freq: Four times a day (QID) | RESPIRATORY_TRACT | 0 refills | Status: DC | PRN

## 2021-10-28 ENCOUNTER — Other Ambulatory Visit: Payer: Self-pay | Admitting: Family Medicine

## 2021-10-28 DIAGNOSIS — E559 Vitamin D deficiency, unspecified: Secondary | ICD-10-CM

## 2021-10-28 MED ORDER — VITAMIN D (ERGOCALCIFEROL) 1.25 MG (50000 UNIT) PO CAPS: 50000.0000 [IU] | ORAL_CAPSULE | ORAL | 1 refills | Status: DC

## 2021-10-28 NOTE — Progress Notes (Signed)
Please inform the patient that she has prediabetes and her vit D is low. I've sent a prescription for once weekly supplement and encouraged her to start therapy.  I recommend low carbs, sugar, and calorie diet for her prediabetes.

## 2021-10-29 LAB — CBC WITH DIFFERENTIAL/PLATELET
Basophils Absolute: 0 10*3/uL (ref 0.0–0.2)
Basos: 1 %
EOS (ABSOLUTE): 0.1 10*3/uL (ref 0.0–0.4)
Eos: 2 %
Hematocrit: 35.1 % (ref 34.0–46.6)
Hemoglobin: 11.3 g/dL (ref 11.1–15.9)
Immature Grans (Abs): 0.1 10*3/uL (ref 0.0–0.1)
Immature Granulocytes: 1 %
Lymphocytes Absolute: 1.8 10*3/uL (ref 0.7–3.1)
Lymphs: 33 %
MCH: 26.2 pg — ABNORMAL LOW (ref 26.6–33.0)
MCHC: 32.2 g/dL (ref 31.5–35.7)
MCV: 81 fL (ref 79–97)
Monocytes Absolute: 0.6 10*3/uL (ref 0.1–0.9)
Monocytes: 10 %
Neutrophils Absolute: 3 10*3/uL (ref 1.4–7.0)
Neutrophils: 53 %
Platelets: 262 10*3/uL (ref 150–450)
RBC: 4.31 x10E6/uL (ref 3.77–5.28)
RDW: 14.1 % (ref 11.7–15.4)
WBC: 5.6 10*3/uL (ref 3.4–10.8)

## 2021-10-29 LAB — CMP14+EGFR
ALT: 15 IU/L (ref 0–32)
AST: 13 IU/L (ref 0–40)
Albumin/Globulin Ratio: 1.3 (ref 1.2–2.2)
Albumin: 4 g/dL (ref 3.8–4.8)
Alkaline Phosphatase: 68 IU/L (ref 44–121)
BUN/Creatinine Ratio: 11 (ref 9–23)
BUN: 7 mg/dL (ref 6–20)
Bilirubin Total: 0.4 mg/dL (ref 0.0–1.2)
CO2: 21 mmol/L (ref 20–29)
Calcium: 8.9 mg/dL (ref 8.7–10.2)
Chloride: 101 mmol/L (ref 96–106)
Creatinine, Ser: 0.61 mg/dL (ref 0.57–1.00)
Globulin, Total: 3 g/dL (ref 1.5–4.5)
Glucose: 79 mg/dL (ref 70–99)
Potassium: 4 mmol/L (ref 3.5–5.2)
Sodium: 138 mmol/L (ref 134–144)
Total Protein: 7 g/dL (ref 6.0–8.5)
eGFR: 119 mL/min/{1.73_m2} (ref >59)

## 2021-10-29 LAB — HEMOGLOBIN A1C
Est. average glucose Bld gHb Est-mCnc: 126 mg/dL
Hgb A1c MFr Bld: 6 % — ABNORMAL HIGH (ref 4.8–5.6)

## 2021-10-29 LAB — LIPID PANEL
Chol/HDL Ratio: 4.3 ratio (ref 0.0–4.4)
Cholesterol, Total: 141 mg/dL (ref 100–199)
HDL: 33 mg/dL — ABNORMAL LOW (ref >39)
LDL Chol Calc (NIH): 97 mg/dL (ref 0–99)
Triglycerides: 48 mg/dL (ref 0–149)
VLDL Cholesterol Cal: 11 mg/dL (ref 5–40)

## 2021-10-29 LAB — HEPATITIS C ANTIBODY: Hep C Virus Ab: NONREACTIVE

## 2021-10-29 LAB — VITAMIN D 25 HYDROXY (VIT D DEFICIENCY, FRACTURES): Vit D, 25-Hydroxy: 6.7 ng/mL — ABNORMAL LOW (ref 30.0–100.0)

## 2021-10-29 LAB — TSH+FREE T4
Free T4: 1.21 ng/dL (ref 0.82–1.77)
TSH: 2.19 u[IU]/mL (ref 0.450–4.500)

## 2021-11-02 ENCOUNTER — Telehealth (INDEPENDENT_AMBULATORY_CARE_PROVIDER_SITE_OTHER): Payer: 59 | Admitting: Obstetrics & Gynecology

## 2021-11-02 ENCOUNTER — Encounter: Payer: Self-pay | Admitting: Obstetrics & Gynecology

## 2021-11-02 VITALS — Ht 62.0 in | Wt 352.0 lb

## 2021-11-02 DIAGNOSIS — N938 Other specified abnormal uterine and vaginal bleeding: Secondary | ICD-10-CM | POA: Diagnosis not present

## 2021-11-02 DIAGNOSIS — D219 Benign neoplasm of connective and other soft tissue, unspecified: Secondary | ICD-10-CM

## 2021-11-02 NOTE — Progress Notes (Signed)
TELEHEALTH VIRTUAL GYNECOLOGY VISIT ENCOUNTER NOTE  I connected with Jessica Sanchez on 11/02/21 at  9:50 AM EDT by MyChart video at home and verified that I am speaking with the correct person using two identifiers.  I am in my office Patient is in her non moving car Total time 10 minutes   I discussed the limitations, risks, security and privacy concerns of performing an evaluation and management service by telephone and the availability of in person appointments. I also discussed with the patient that there may be a patient responsible charge related to this service. The patient expressed understanding and agreed to proceed.   History:  Jessica Sanchez is a 36 y.o. [id number] female being evaluated today for ongoing DUB on myfembree + megestrol without adequTE CONTROL.  Hemoglobin is stable  She is on eliquis  Long standing DUB Without successful management to this point  . She denies any abnormal vaginal discharge, bleeding, pelvic pain or other concerns.       Past Medical History:  Diagnosis Date   Arthritis    right shoulder   Dyspnea    Dysrhythmia    hx of SVT   Fibroid (bleeding) (uterine)    Fibroid, uterine    H/O metrorrhagia    Hypertension    IUD (intrauterine device) in place 07/11/2015   Menorrhagia    Obesity, Class III, BMI 40-49.9 (morbid obesity) ([place]) 11/11/2018   Sleep apnea    uses CPAP - setting 11   SVT (supraventricular tachycardia) ([place])    Past Surgical History:  Procedure Laterality Date   HYSTEROSCOPY N/A 09/17/2020   Procedure: HYSTEROSCOPY;  Surgeon: [name], MD;  Location: [facility];  Service: Gynecology;  Laterality: N/A;   HYSTEROSCOPY WITH D & C N/A 09/27/2013   Procedure: DILATATION AND CURETTAGE /HYSTEROSCOPY and insertion of Mirena IUD ;  Surgeon: [name]. Harrington Challenger, MD;  Location: [facility];  Service: Gynecology;  Laterality: N/A;   IUD REMOVAL N/A 09/17/2020   Procedure: INTRAUTERINE DEVICE (IUD) REMOVAL (2);  Surgeon: [name], MD;   Location: [facility];  Service: Gynecology;  Laterality: N/A;   MYOMECTOMY  02/03/2011   Procedure: MYOMECTOMY;  Surgeon: [name], MD;  Location: [facility];  Service: Gynecology;  Laterality: N/A;   SVT ABLATION N/A 11/08/2018   Procedure: SVT ABLATION;  Surgeon: [name], MD;  Location: [facility];  Service: Cardiovascular;  Laterality: N/A;   SVT ABLATION N/A 12/26/2020   Procedure: SVT ABLATION;  Surgeon: [name], MD;  Location: [facility];  Service: Cardiovascular;  Laterality: N/A;   TONSILLECTOMY     The following portions of the patient's history were reviewed and updated as appropriate: allergies, current medications, past family history, past medical history, past social history, past surgical history and problem list.     Review of Systems:  Pertinent items noted in HPI and remainder of comprehensive ROS otherwise negative.  Physical Exam:  Physical exam deferred due to nature of the encounter  Labs and Imaging Results for orders placed or performed in visit on 10/26/21 (from the past 336 hour(s))  CBC with Differential/Platelet   Collection Time: 10/27/21 11:11 AM  Result Value Ref Range   WBC 5.6 3.4 - 10.8 x10E3/uL   RBC 4.31 3.77 - 5.28 x10E6/uL   Hemoglobin 11.3 11.1 - 15.9 g/dL   Hematocrit 35.1 34.0 - 46.6 %   MCV 81 79 - 97 fL   MCH 26.2 (L)  26.6 - 33.0 pg   MCHC 32.2 31.5 - 35.7 g/dL   RDW 14.1 11.7 - 15.4 %   Platelets 262 150 - 450 x10E3/uL   Neutrophils 53 Not Estab. %   Lymphs 33 Not Estab. %   Monocytes 10 Not Estab. %   Eos 2 Not Estab. %   Basos 1 Not Estab. %   Neutrophils Absolute 3.0 1.4 - 7.0 x10E3/uL   Lymphocytes Absolute 1.8 0.7 - 3.1 x10E3/uL   Monocytes Absolute 0.6 0.1 - 0.9 x10E3/uL   EOS (ABSOLUTE) 0.1 0.0 - 0.4 x10E3/uL   Basophils Absolute 0.0 0.0 - 0.2 x10E3/uL   Immature Granulocytes 1 Not Estab. %   Immature Grans (Abs) 0.1 0.0 - 0.1 x10E3/uL  CMP14+EGFR   Collection Time: 10/27/21 11:11 AM  Result  Value Ref Range   Glucose 79 70 - 99 mg/dL   BUN 7 6 - 20 mg/dL   Creatinine, Ser 0.61 0.57 - 1.00 mg/dL   eGFR 119 >59 mL/min/1.73   BUN/Creatinine Ratio 11 9 - 23   Sodium 138 134 - 144 mmol/L   Potassium 4.0 3.5 - 5.2 mmol/L   Chloride 101 96 - 106 mmol/L   CO2 21 20 - 29 mmol/L   Calcium 8.9 8.7 - 10.2 mg/dL   Total Protein 7.0 6.0 - 8.5 g/dL   Albumin 4.0 3.8 - 4.8 g/dL   Globulin, Total 3.0 1.5 - 4.5 g/dL   Albumin/Globulin Ratio 1.3 1.2 - 2.2   Bilirubin Total 0.4 0.0 - 1.2 mg/dL   Alkaline Phosphatase 68 44 - 121 IU/L   AST 13 0 - 40 IU/L   ALT 15 0 - 32 IU/L  TSH + free T4   Collection Time: 10/27/21 11:11 AM  Result Value Ref Range   TSH 2.190 0.450 - 4.500 uIU/mL   Free T4 1.21 0.82 - 1.77 ng/dL  Hemoglobin A1C   Collection Time: 10/27/21 11:11 AM  Result Value Ref Range   Hgb A1c MFr Bld 6.0 (H) 4.8 - 5.6 %   Est. average glucose Bld gHb Est-mCnc 126 mg/dL  Lipid Profile   Collection Time: 10/27/21 11:11 AM  Result Value Ref Range   Cholesterol, Total 141 100 - 199 mg/dL   Triglycerides 48 0 - 149 mg/dL   HDL 33 (L) >39 mg/dL   VLDL Cholesterol Cal 11 5 - 40 mg/dL   LDL Chol Calc (NIH) 97 0 - 99 mg/dL   Chol/HDL Ratio 4.3 0.0 - 4.4 ratio  Vitamin D (25 hydroxy)   Collection Time: 10/27/21 11:11 AM  Result Value Ref Range   Vit D, 25-Hydroxy 6.7 (L) 30.0 - 100.0 ng/mL  Hepatitis C Antibody   Collection Time: 10/27/21 11:11 AM  Result Value Ref Range   Hep C Virus Ab Non Reactive Non Reactive  Results for orders placed or performed during the hospital encounter of 10/23/21 (from the past 336 hour(s))  SARS Coronavirus 2 by RT PCR (hospital order, performed in [facility] hospital lab) *cepheid single result test* Anterior Nasal Swab   Collection Time: 10/23/21  6:53 PM   Specimen: Anterior Nasal Swab  Result Value Ref Range   SARS Coronavirus 2 by RT PCR NEGATIVE NEGATIVE  Group A Strep by PCR   Collection Time: 10/23/21  6:53 PM   Specimen: Anterior  Nasal Swab; Sterile Swab  Result Value Ref Range   Group A Strep by PCR NOT DETECTED NOT DETECTED  Results for orders placed or performed during the hospital  encounter of 10/20/21 (from the past 336 hour(s))  POCT rapid strep A   Collection Time: 10/20/21 12:17 PM  Result Value Ref Range   Rapid Strep A Screen Negative Negative  Culture, group A strep   Collection Time: 10/20/21 12:21 PM   Specimen: Throat  Result Value Ref Range   Specimen Description      THROAT Performed at [facility], [address], [place], [place] [address]    Special Requests      NONE Performed at [facility], [address] [place], [place] [address]    Culture      NO GROUP A STREP (S.PYOGENES) ISOLATED Performed at [facility], [place] [address], [place], [place] [address]    Report Status 10/23/2021 FINAL    DG Hand Complete Left  Result Date: 10/06/2021 CLINICAL DATA:  Trauma, pain EXAM: LEFT HAND - COMPLETE 3+ VIEW COMPARISON:  Left index finger study done on 03/18/2012 FINDINGS: There is no evidence of fracture or dislocation. There is no evidence of arthropathy or other focal bone abnormality. Soft tissues are unremarkable. IMPRESSION: No fracture or dislocation is seen in the left hand. Electronically Signed   By: Elmer Picker M.D.   On: 10/06/2021 16:05       No orders of the defined types were placed in this encounter.   No orders of the defined types were placed in this encounter.   Assessment and Plan:       ICD-10-CM   1. DUB (dysfunctional uterine bleeding)  N93.8    unresponsive to myfembree alone and also to tmegestrol add on, had 2 IUDs not responsive, schedule for endometrial ablation [facility] week of 12/07/21    2. Fibroid, 433 cc(50% increase over previous)  D21.9            I discussed the assessment and treatment plan with the patient. The patient was provided an opportunity to ask questions and all were answered. The patient agreed with the plan and  demonstrated an understanding of the instructions.   The patient was advised to call back or seek an in-person evaluation/go to the ED if the symptoms worsen or if the condition fails to improve as anticipated.  I provided 10 minutes of non-face-to-face time during this encounter.   [name], [place] for [facility]

## 2021-11-05 ENCOUNTER — Encounter: Payer: Self-pay | Admitting: Obstetrics & Gynecology

## 2021-11-16 ENCOUNTER — Emergency Department (HOSPITAL_COMMUNITY): Payer: 59

## 2021-11-16 ENCOUNTER — Emergency Department (HOSPITAL_COMMUNITY)
Admission: EM | Admit: 2021-11-16 | Discharge: 2021-11-16 | Disposition: A | Discharge status: Home / Self Care | Payer: 59 | Attending: Emergency Medicine | Admitting: Emergency Medicine

## 2021-11-16 ENCOUNTER — Encounter (HOSPITAL_COMMUNITY): Payer: Self-pay | Admitting: *Deleted

## 2021-11-16 ENCOUNTER — Other Ambulatory Visit: Payer: Self-pay

## 2021-11-16 DIAGNOSIS — I10 Essential (primary) hypertension: Secondary | ICD-10-CM | POA: Insufficient documentation

## 2021-11-16 DIAGNOSIS — R600 Localized edema: Secondary | ICD-10-CM | POA: Insufficient documentation

## 2021-11-16 DIAGNOSIS — Z7901 Long term (current) use of anticoagulants: Secondary | ICD-10-CM | POA: Insufficient documentation

## 2021-11-16 DIAGNOSIS — M7989 Other specified soft tissue disorders: Secondary | ICD-10-CM | POA: Diagnosis present

## 2021-11-16 DIAGNOSIS — Z79899 Other long term (current) drug therapy: Secondary | ICD-10-CM | POA: Insufficient documentation

## 2021-11-16 LAB — CBC WITH DIFFERENTIAL/PLATELET
Abs Immature Granulocytes: 0.01 10*3/uL (ref 0.00–0.07)
Basophils Absolute: 0 10*3/uL (ref 0.0–0.1)
Basophils Relative: 0 %
Eosinophils Absolute: 0.2 10*3/uL (ref 0.0–0.5)
Eosinophils Relative: 2 %
HCT: 37.3 % (ref 36.0–46.0)
Hemoglobin: 11.7 g/dL — ABNORMAL LOW (ref 12.0–15.0)
Immature Granulocytes: 0 %
Lymphocytes Relative: 25 %
Lymphs Abs: 1.6 10*3/uL (ref 0.7–4.0)
MCH: 26.8 pg (ref 26.0–34.0)
MCHC: 31.4 g/dL (ref 30.0–36.0)
MCV: 85.4 fL (ref 80.0–100.0)
Monocytes Absolute: 0.5 10*3/uL (ref 0.1–1.0)
Monocytes Relative: 9 %
Neutro Abs: 4 10*3/uL (ref 1.7–7.7)
Neutrophils Relative %: 64 %
Platelets: 289 10*3/uL (ref 150–400)
RBC: 4.37 MIL/uL (ref 3.87–5.11)
RDW: 15.5 % (ref 11.5–15.5)
WBC: 6.3 10*3/uL (ref 4.0–10.5)
nRBC: 0 % (ref 0.0–0.2)

## 2021-11-16 LAB — BASIC METABOLIC PANEL
Anion gap: 3 — ABNORMAL LOW (ref 5–15)
BUN: 9 mg/dL (ref 6–20)
CO2: 26 mmol/L (ref 22–32)
Calcium: 8.7 mg/dL — ABNORMAL LOW (ref 8.9–10.3)
Chloride: 107 mmol/L (ref 98–111)
Creatinine, Ser: 0.63 mg/dL (ref 0.44–1.00)
GFR, Estimated: >60 mL/min (ref >60)
Glucose, Bld: 100 mg/dL — ABNORMAL HIGH (ref 70–99)
Potassium: 3.8 mmol/L (ref 3.5–5.1)
Sodium: 136 mmol/L (ref 135–145)

## 2021-11-16 MED ORDER — FUROSEMIDE 20 MG PO TABS: 20.0000 mg | ORAL_TABLET | Freq: Every day | ORAL | 0 refills | Status: DC

## 2021-11-16 NOTE — ED Provider Notes (Signed)
[facility] EMERGENCY DEPARTMENT Provider Note   CSN: [id number] Arrival date & time: 11/16/21  1440     History  Chief Complaint  Patient presents with   Leg Swelling    Jessica Sanchez is a 36 y.o. female.  HPI  Medical history including menorrhea, sleep apnea, SVT, hypertension, obesity presents with complaints of bilateral leg swelling been going on for last 3 days, states that both her legs feel swollen, and tight, states it hurts when she ambulates, no associated chest pain or shortness of breath, no history of CHF, cirrhosis, denies alcohol use, none Dors any chest pain shortness of breath pleuritic chest pain no history of PEs or DVTs.   Home Medications Prior to Admission medications   Medication Sig Start Date End Date Taking? Authorizing Provider  furosemide (LASIX) 20 MG tablet Take 1 tablet (20 mg total) by mouth daily for 4 days. 11/16/21 11/20/21 Yes Marcello Fennel, PA-C  albuterol (VENTOLIN HFA) 108 (90 Base) MCG/ACT inhaler Inhale 2 puffs into the lungs every 6 (six) hours as needed for wheezing or shortness of breath. 10/26/21   [name], FNP  hydrochlorothiazide (MICROZIDE) 12.5 MG capsule Take 1 capsule (12.5 mg total) by mouth daily. 10/26/21   [name], FNP  megestrol (MEGACE) 40 MG tablet 3 tablets a day for 5 days, 2 tablets a day for 5 days then 1 tablet daily 07/10/21   [name], MD  Multiple Vitamins-Calcium (ONE-A-DAY WOMENS PO) Take 1 tablet by mouth daily.    [provider]  Relugolix-Estradiol-Norethind (MYFEMBREE) 40-1-0.5 MG TABS Take 1 tablet by mouth daily. 10/01/21   [name], MD  UNABLE TO FIND Med for heart-1.5 tabs daily pt can not remember the name.    [provider]  Vitamin D, Ergocalciferol, (DRISDOL) 1.25 MG (50000 UNIT) CAPS capsule Take 1 capsule (50,000 Units total) by mouth every 7 (seven) days. 10/28/21   [name], FNP  apixaban (ELIQUIS) 5 MG TABS tablet Take 2 tablets ('10mg'$ ) twice daily  for 7 days, then 1 tablet ('5mg'$ ) twice daily 04/20/19 04/20/19  Rodell Perna A, PA-C  levonorgestrel (MIRENA) 20 MCG/24HR IUD 1 each by Intrauterine route once.   06/25/20  [provider]      Allergies    Shellfish allergy, Sulfa antibiotics, and Adhesive [tape]    Review of Systems   Review of Systems  Constitutional:  Negative for chills and fever.  Respiratory:  Negative for shortness of breath.   Cardiovascular:  Positive for leg swelling. Negative for chest pain.  Gastrointestinal:  Negative for abdominal pain.  Neurological:  Negative for headaches.    Physical Exam Updated Vital Signs BP (!) 147/66 (BP Location: Right Wrist)   Pulse 81   Temp 97.8 F (36.6 C) (Oral)   Resp 20   Ht '5\' 2"'$  (1.575 m)   Wt (!) 159.7 kg   LMP 11/10/2021 (Exact Date)   SpO2 96%   BMI 64.38 kg/m  Physical Exam Vitals and nursing note reviewed.  Constitutional:      General: She is not in acute distress.    Appearance: She is not ill-appearing.  HENT:     Head: Normocephalic and atraumatic.     Nose: No congestion.  Eyes:     Conjunctiva/sclera: Conjunctivae normal.  Cardiovascular:     Rate and Rhythm: Normal rate and regular rhythm.     Pulses: Normal pulses.     Heart sounds: No murmur heard.    No  friction rub. No gallop.  Pulmonary:     Effort: No respiratory distress.     Breath sounds: No wheezing, rhonchi or rales.  Musculoskeletal:     Right lower leg: Edema present.     Left lower leg: Edema present.     Comments: Patient has 1+ pitting pedal edema, no calf tenderness no palpable cords neurovascular fully intact.  No overlying skin changes.  Skin:    General: Skin is warm and dry.  Neurological:     Mental Status: She is alert.  Psychiatric:        Mood and Affect: Mood normal.     ED Results / Procedures / Treatments   Labs (all labs ordered are listed, but only abnormal results are displayed) Labs Reviewed  CBC WITH DIFFERENTIAL/PLATELET - Abnormal;  Notable for the following components:      Result Value   Hemoglobin 11.7 (*)    All other components within normal limits  BASIC METABOLIC PANEL - Abnormal; Notable for the following components:   Glucose, Bld 100 (*)    Calcium 8.7 (*)    Anion gap 3 (*)    All other components within normal limits    EKG EKG Interpretation  Date/Time:  Monday November 16 2021 17:39:56 EDT Ventricular Rate:  76 PR Interval:  137 QRS Duration: 94 QT Interval:  393 QTC Calculation: 442 R Axis:   13 Text Interpretation: Sinus rhythm Confirmed by Fredia Sorrow [phone]) on 11/16/2021 5:50:58 PM  Radiology DG Chest Portable 1 View  Result Date: 11/16/2021 CLINICAL DATA:  Bilateral lower extremity swelling EXAM: PORTABLE CHEST 1 VIEW COMPARISON:  07/28/2021 FINDINGS: Single frontal view of the chest demonstrates an unremarkable cardiac silhouette allowing for portable AP technique. Mild central vascular congestion again noted without airspace disease, effusion, or pneumothorax. No acute bony abnormalities. IMPRESSION: 1. Chronic central vascular congestion. No acute airspace disease or effusion. Electronically Signed   By: Randa Ngo M.D.   On: 11/16/2021 17:36   US Venous Img Lower Bilateral  Result Date: 11/16/2021 CLINICAL DATA:  Leg swelling EXAM: BILATERAL LOWER EXTREMITY VENOUS DOPPLER ULTRASOUND TECHNIQUE: Gray-scale sonography with compression, as well as color and duplex ultrasound, were performed to evaluate the deep venous system(s) from the level of the common femoral vein through the popliteal and proximal calf veins. COMPARISON:  None Available. FINDINGS: VENOUS Normal compressibility of the common femoral, superficial femoral, and popliteal veins, as well as the visualized calf veins. Visualized portions of profunda femoral vein and great saphenous vein unremarkable. No filling defects to suggest DVT on grayscale or color Doppler imaging. Doppler waveforms show normal direction of venous  flow, normal respiratory plasticity and response to augmentation. Limited views of the contralateral common femoral vein are unremarkable. OTHER Bilateral calf edema. Limitations: Technically limited by body habitus. IMPRESSION: No evidence of lower extremity DVT. Electronically Signed   By: Macy Mis M.D.   On: 11/16/2021 16:42    Procedures Procedures    Medications Ordered in ED Medications - No data to display  ED Course/ Medical Decision Making/ A&P                           Medical Decision Making Amount and/or Complexity of Data Reviewed Labs: ordered. Radiology: ordered.   This patient presents to the ED for concern of leg swelling, this involves an extensive number of treatment options, and is a complaint that carries with it a high risk of complications  and morbidity.  The differential diagnosis includes DVT, venous insufficiency, CHF    Additional history obtained:  Additional history obtained from N/A External records from outside source obtained and reviewed including previous ED notes   Co morbidities that complicate the patient evaluation  Obesity, hypertension  Social Determinants of Health:  N/A    Lab Tests:  I Ordered, and personally interpreted labs.  The pertinent results include: CBC unremarkable, BMP shows glucose of 100, calcium 8.7, anion gap 3,   Imaging Studies ordered:  I ordered imaging studies including DVT study, chest x-ray I independently visualized and interpreted imaging which showed DVT study is negative, chest x-ray shows chronic pulmonary congestion I agree with the radiologist interpretation   Cardiac Monitoring:  The patient was maintained on a cardiac monitor.  I personally viewed and interpreted the cardiac monitored which showed an underlying rhythm of: Without signs of ischemia   Medicines ordered and prescription drug management:  I ordered medication including N/A I have reviewed the patients home medicines  and have made adjustments as needed  Critical Interventions:  N/A   Reevaluation:  Presents with bilateral leg swelling, she was obtained lab work imaging of appears reviewed they are unremarkable, will add on chest x-ray as well as BNP and reassess.  Results patient has no complaints she is agreeable to plan and discharge at this time.    Consultations Obtained:  N/A    Test Considered:  Troponins-deferred as my suspicion for ACS is very low at this time not Dors any chest pain, EKG without signs of ischemia.    Rule out DVT unlikely as study was negative.  Low suspicion for cirrhosis no history of this in the past denies alcohol use, she has no right quadrant tenderness she is not jaundiced on my exam.  Low suspicion for CHF requiring admission as she has no new oxygen requirements, she does not appear to be volume overloaded my exam, she only has 1+ pain edema on on the tops of the feet, not on the legs themselves.  Although suspicion for cellulitis no evidence of infection present my exam.    Dispostion and problem list  After consideration of the diagnostic results and the patients response to treatment, I feel that the patent would benefit from discharge.  Leg swelling-unclear etiology possible venous insufficiency versus possible CHF but I find this less likely she had an echocardiogram 3 years ago which was unremarkable, due to her history of hypertension SVT as well as obesity we will have her follow-up with cards for reassessment likely echocardiogram would be beneficial.  We will provide her with Lasix.            Final Clinical Impression(s) / ED Diagnoses Final diagnoses:  Leg swelling    Rx / DC Orders ED Discharge Orders          Ordered    furosemide (LASIX) 20 MG tablet  Daily        11/16/21 1816              Marcello Fennel, PA-C 11/16/21 1817    Fredia Sorrow, MD 11/20/21 1654

## 2021-11-16 NOTE — ED Triage Notes (Signed)
Pt with swelling to bilateral LE's x 3 days.  C/o cramping to lower legs as well.

## 2021-11-16 NOTE — Discharge Instructions (Signed)
Recommends keeping your legs elevated will not use, this will help decrease swelling, also recommend compression stockings as this can help with your swelling.  I have started you on a fluid pill please take as prescribed  Please follow with your cardiologist and your PCP for reassessment.  Come back to the emergency department if you develop chest pain, shortness of breath, severe abdominal pain, uncontrolled nausea, vomiting, diarrhea.

## 2021-11-18 ENCOUNTER — Encounter: Payer: Self-pay | Admitting: Family Medicine

## 2021-11-18 ENCOUNTER — Ambulatory Visit (INDEPENDENT_AMBULATORY_CARE_PROVIDER_SITE_OTHER): Payer: 59 | Admitting: Family Medicine

## 2021-11-18 VITALS — BP 130/78 | HR 107 | Ht 62.0 in | Wt 350.0 lb

## 2021-11-18 DIAGNOSIS — R609 Edema, unspecified: Secondary | ICD-10-CM | POA: Diagnosis not present

## 2021-11-18 DIAGNOSIS — Z6841 Body Mass Index (BMI) 40.0 and over, adult: Secondary | ICD-10-CM

## 2021-11-18 DIAGNOSIS — G4733 Obstructive sleep apnea (adult) (pediatric): Secondary | ICD-10-CM

## 2021-11-18 MED ORDER — FUROSEMIDE 20 MG PO TABS: 20.0000 mg | ORAL_TABLET | Freq: Two times a day (BID) | ORAL | 0 refills | Status: DC

## 2021-11-18 MED ORDER — POTASSIUM CHLORIDE CRYS ER 20 MEQ PO TBCR: 20.0000 meq | EXTENDED_RELEASE_TABLET | Freq: Two times a day (BID) | ORAL | 0 refills | Status: DC

## 2021-11-18 NOTE — Patient Instructions (Addendum)
F/u with PCP , Peter Congo in 2 weeks, re evaluate leg swelling  New for leg swelling short term is furosemide twice daily and potassium twice daily  Please get non fasting chem 7 and eGFR 2 to 3 days before your next visit  Continue medications you are currently taking  You are referred to Pulmonary Specialist to treat sleep apnea  Thanks for choosing [facility], we consider it a privelige to serve you.

## 2021-11-23 ENCOUNTER — Encounter: Payer: Self-pay | Admitting: Family Medicine

## 2021-11-23 ENCOUNTER — Ambulatory Visit: Payer: 59 | Admitting: Family Medicine

## 2021-11-23 DIAGNOSIS — G4733 Obstructive sleep apnea (adult) (pediatric): Secondary | ICD-10-CM | POA: Insufficient documentation

## 2021-11-23 NOTE — Assessment & Plan Note (Signed)
  Patient re-educated about  the importance of commitment to a  minimum of 150 minutes of exercise per week as able.  The importance of healthy food choices with portion control discussed, as well as eating regularly and within a 12 hour window most days. The need to choose "clean , green" food 50 to 75% of the time is discussed, as well as to make water the primary drink and set a goal of 64 ounces water daily.       11/18/2021    2:52 PM 11/16/2021    2:57 PM 11/02/2021   10:37 AM  Weight /BMI  Weight 350 lb 352 lb 352 lb  Height '5\' 2"'$  (1.575 m) '5\' 2"'$  (1.575 m) '5\' 2"'$  (1.575 m)  BMI 64.02 kg/m2 64.38 kg/m2 64.38 kg/m2

## 2021-11-23 NOTE — Assessment & Plan Note (Signed)
Needs treatment, refer o pulmonay, h/o snoring, excess daytime sleeepiness

## 2021-11-23 NOTE — Assessment & Plan Note (Signed)
Marked bilateral edema , 2 to 3 plus pitting, short course of high dose diuretic with close follow up with PCP, needs non fast chem 7 and EGFr prior to visit

## 2021-11-23 NOTE — Progress Notes (Signed)
   Jessica Sanchez     MRN: [id number]      DOB: March 28, 1986   HPI Jessica Sanchez is here c/oincreased leg swelling which makes it very difficult to put on shoes and to walk. C/o snoring, excess daytime fatigue, needs treatment for OSA   ROS Denies recent fever or chills. Denies sinus pressure, nasal congestion, ear pain or sore throat. Denies chest congestion, productive cough or wheezing. Denies chest pains, palpitations , PND or orthopnea Denies abdominal pain, nausea, vomiting,diarrhea or constipation.   Denies dysuria, frequency, hesitancy or incontinence.   PE  BP 130/78   Pulse (!) 107   Ht $R'5\' 2"'Kh$  (1.575 m)   Wt (!) 350 lb (158.8 kg)   LMP 11/10/2021 (Exact Date)   SpO2 93%   BMI 64.02 kg/m   Patient alert and oriented s.  HEENT: No facial asymmetry, EOMI,     Neck supple .  Chest: Clear to auscultation bilaterally.  CVS: S1, S2 no murmurs, no S3.Regular rate. 2 plus pitting edema bilaterally ABD: Soft non tender.   MS: Adequate ROM spine, shoulders, hips and knees.  Skin: Intact, no ulcerations or rash noted.  Psych: Good eye contact, normal affect. Memory intact not anxious or depressed appearing.  CNS: CN 2-12 intact, power,  normal throughout.no focal deficits noted.   Assessment & Plan  Peripheral edema Marked bilateral edema , 2 to 3 plus pitting, short course of high dose diuretic with close follow up with PCP, needs non fast chem 7 and EGFr prior to visit  Morbid obesity with body mass index (BMI) of 60.0 to 69.9 in adult [facility])  Patient re-educated about  the importance of commitment to a  minimum of 150 minutes of exercise per week as able.  The importance of healthy food choices with portion control discussed, as well as eating regularly and within a 12 hour window most days. The need to choose "clean , green" food 50 to 75% of the time is discussed, as well as to make water the primary drink and set a goal of 64 ounces water daily.       11/18/2021     2:52 PM 11/16/2021    2:57 PM 11/02/2021   10:37 AM  Weight /BMI  Weight 350 lb 352 lb 352 lb  Height $Remov'5\' 2"'tNxEiK$  (1.575 m) $RemoveB'5\' 2"'UgqksYVa$  (1.575 m) $RemoveB'5\' 2"'YOlsCCRz$  (1.575 m)  BMI 64.02 kg/m2 64.38 kg/m2 64.38 kg/m2      OSA (obstructive sleep apnea) Needs treatment, refer o pulmonay, h/o snoring, excess daytime sleeepiness

## 2021-11-25 ENCOUNTER — Other Ambulatory Visit: Payer: Self-pay | Admitting: Obstetrics & Gynecology

## 2021-11-25 DIAGNOSIS — Z01818 Encounter for other preprocedural examination: Secondary | ICD-10-CM

## 2021-11-25 NOTE — Patient Instructions (Signed)
Jessica Sanchez  11/25/2021     '@PREFPERIOPPHARMACY'$ @   Your procedure is scheduled on  12/01/2021.   Report to [facility] at  [email or web address]   Call this number if you have problems the morning of surgery:  [phone]   Remember:  Do not eat after midnight.   You may drink clear liquids until 0605 am on 12/01/2021.     Clear liquids allowed are:                    Water, Juice (No red color; non-citric and without pulp; diabetics please choose diet or no sugar options), Carbonated beverages (diabetics please choose diet or no sugar options), Clear Tea (No creamer, milk, or cream, including half & half and powdered creamer), Black Coffee Only (No creamer, milk or cream, including half & half and powdered creamer), Plain Jell-O Only (No red color; diabetics please choose no sugar options), Clear Sports drink (No red color; diabetics please choose diet or no sugar options), and Plain Popsicles Only (No red color; diabetics please choose no sugar options)    At 0605 am on 12/01/2021 drink your carb drink. You can have nothing else to drink after this.     Take these medicines the morning of surgery with A SIP OF WATER                                     tambocor.     Do not wear jewelry, make-up or nail polish.  Do not wear lotions, powders, or perfumes, or deodorant.  Do not shave 48 hours prior to surgery.  Men may shave face and neck.  Do not bring valuables to the hospital.  [facility] is not responsible for any belongings or valuables.  Contacts, dentures or bridgework may not be worn into surgery.  Leave your suitcase in the car.  After surgery it may be brought to your room.  For patients admitted to the hospital, discharge time will be determined by your treatment team.  Patients discharged the day of surgery will not be allowed to drive home and must have someone with them for 24 hours.    Special instructions:   DO NOT smoke tobacco or vape for 24 hors before  your procedure.  Please read over the following fact sheets that you were given. Pain Booklet, Coughing and Deep Breathing, Surgical Site Infection Prevention, Anesthesia Post-op Instructions, and Care and Recovery After Surgery        Dilation and Curettage or Vacuum Curettage, Care After The following information offers guidance on how to care for yourself after your procedure. Your doctor may also give you more specific instructions. If you have problems or questions, contact your doctor. What can I expect after the procedure? After the procedure, it is common to have: Mild pain or cramps. Some bleeding or spotting from the vagina. These may last for up to 2 weeks. Follow these instructions at home: Medicines Take over-the-counter and prescription medicines only as told by your doctor. If told, take steps to prevent problems with pooping (constipation). You may need to: Drink enough fluid to keep your pee (urine) pale yellow. Take medicines. You will be told what medicines to take. Eat foods that are high in fiber. These include beans, whole grains, and fresh fruits and vegetables. Limit foods that  are high in fat and sugar. These include fried or sweet foods. Ask your doctor if you should avoid driving or using machines while you are taking your medicine. Activity  If you were given a medicine to help you relax (sedative) during your procedure, it can affect you for many hours. Do not drive or use machinery until your doctor says that it is safe. Rest as told by your doctor. Get up to take short walks every 1-2 hours. Ask for help if you feel weak or unsteady. Do not lift anything that is heavier than 10 lb (4.5 kg), or the limit that you are told. Return to your normal activities when your doctor says that it is safe. Lifestyle For at least 2 weeks, or as long as told by your doctor: Do not douche. Do not use tampons. Do not have sex. General instructions Do not take baths,  swim, or use a hot tub. Ask your doctor if you may take showers. Do not smoke or use any products that contain nicotine or tobacco. These can delay healing. If you need help quitting, ask your doctor. Wear compression stockings as told by your doctor. It is up to you to get the results of your procedure. Ask how to get your results when they are ready. Keep all follow-up visits. Contact a doctor if: You have very bad cramps that get worse or do not get better with medicine. You have very bad pain in your belly (abdomen). You cannot drink fluids without vomiting. You have pain in the area just above your thighs. You have fluid from your vagina that smells bad. You have a rash. Get help right away if: You are bleeding a lot from your vagina. This means soaking more than one sanitary pad in 1 hour, and this happens for 2 hours in a row. You have a fever that is above 100.72F (38C). Your belly feels very tender or hard. You have chest pain. You have trouble breathing. You feel dizzy or light-headed. You faint. You have pain in your neck or shoulder area. These symptoms may be an emergency. Get help right away. Call your local emergency services (911 in the [place] Do not wait to see if the symptoms will go away. Do not drive yourself to the hospital. Summary After your procedure, it is common to have pain or cramping. It is also common to have bleeding or spotting from your vagina. Rest as told. Get up to take short walks every 1-2 hours. Do not lift anything that is heavier than 10 lb (4.5 kg), or the limit that you are told. Get help right away if you have problems from the procedure. Ask your doctor what problems to watch for. This information is not intended to replace advice given to you by your health care provider. Make sure you discuss any questions you have with your health care provider. Document Revised: 04/09/2020 Document Reviewed: 04/09/2020 Elsevier Patient Education  [place] Anesthesia, Adult, Care After This sheet gives you information about how to care for yourself after your procedure. Your health care provider may also give you more specific instructions. If you have problems or questions, contact your health care provider. What can I expect after the procedure? After the procedure, the following side effects are common: Pain or discomfort at the IV site. Nausea. Vomiting. Sore throat. Trouble concentrating. Feeling cold or chills. Feeling weak or tired. Sleepiness and fatigue. Soreness and body aches. These side effects can affect  parts of the body that were not involved in surgery. Follow these instructions at home: For the time period you were told by your health care provider:  Rest. Do not participate in activities where you could fall or become injured. Do not drive or use machinery. Do not drink alcohol. Do not take sleeping pills or medicines that cause drowsiness. Do not make important decisions or sign legal documents. Do not take care of children on your own. Eating and drinking Follow any instructions from your health care provider about eating or drinking restrictions. When you feel hungry, start by eating small amounts of foods that are soft and easy to digest (bland), such as toast. Gradually return to your regular diet. Drink enough fluid to keep your urine pale yellow. If you vomit, rehydrate by drinking water, juice, or clear broth. General instructions If you have sleep apnea, surgery and certain medicines can increase your risk for breathing problems. Follow instructions from your health care provider about wearing your sleep device: Anytime you are sleeping, including during daytime naps. While taking prescription pain medicines, sleeping medicines, or medicines that make you drowsy. Have a responsible adult stay with you for the time you are told. It is important to have someone help care for you until you  are awake and alert. Return to your normal activities as told by your health care provider. Ask your health care provider what activities are safe for you. Take over-the-counter and prescription medicines only as told by your health care provider. If you smoke, do not smoke without supervision. Keep all follow-up visits as told by your health care provider. This is important. Contact a health care provider if: You have nausea or vomiting that does not get better with medicine. You cannot eat or drink without vomiting. You have pain that does not get better with medicine. You are unable to pass urine. You develop a skin rash. You have a fever. You have redness around your IV site that gets worse. Get help right away if: You have difficulty breathing. You have chest pain. You have blood in your urine or stool, or you vomit blood. Summary After the procedure, it is common to have a sore throat or nausea. It is also common to feel tired. Have a responsible adult stay with you for the time you are told. It is important to have someone help care for you until you are awake and alert. When you feel hungry, start by eating small amounts of foods that are soft and easy to digest (bland), such as toast. Gradually return to your regular diet. Drink enough fluid to keep your urine pale yellow. Return to your normal activities as told by your health care provider. Ask your health care provider what activities are safe for you. This information is not intended to replace advice given to you by your health care provider. Make sure you discuss any questions you have with your health care provider. Document Revised: 01/03/2020 Document Reviewed: 08/02/2019 Elsevier Patient Education  [place].

## 2021-11-27 ENCOUNTER — Other Ambulatory Visit (HOSPITAL_COMMUNITY): Payer: 59

## 2021-11-27 ENCOUNTER — Encounter (HOSPITAL_COMMUNITY)
Admission: RE | Admit: 2021-11-27 | Discharge: 2021-11-27 | Disposition: A | Discharge status: Home / Self Care | Payer: 59 | Source: Ambulatory Visit | Attending: Obstetrics & Gynecology | Admitting: Obstetrics & Gynecology

## 2021-11-27 DIAGNOSIS — Z01818 Encounter for other preprocedural examination: Secondary | ICD-10-CM

## 2021-11-27 DIAGNOSIS — Z01812 Encounter for preprocedural laboratory examination: Secondary | ICD-10-CM | POA: Insufficient documentation

## 2021-11-27 LAB — COMPREHENSIVE METABOLIC PANEL
ALT: 12 U/L (ref 0–44)
AST: 12 U/L — ABNORMAL LOW (ref 15–41)
Albumin: 3.7 g/dL (ref 3.5–5.0)
Alkaline Phosphatase: 57 U/L (ref 38–126)
Anion gap: 6 (ref 5–15)
BUN: 10 mg/dL (ref 6–20)
CO2: 25 mmol/L (ref 22–32)
Calcium: 9.1 mg/dL (ref 8.9–10.3)
Chloride: 105 mmol/L (ref 98–111)
Creatinine, Ser: 0.71 mg/dL (ref 0.44–1.00)
GFR, Estimated: >60 mL/min (ref >60)
Glucose, Bld: 98 mg/dL (ref 70–99)
Potassium: 3.9 mmol/L (ref 3.5–5.1)
Sodium: 136 mmol/L (ref 135–145)
Total Bilirubin: 0.6 mg/dL (ref 0.3–1.2)
Total Protein: 7.4 g/dL (ref 6.5–8.1)

## 2021-11-27 LAB — URINALYSIS, ROUTINE W REFLEX MICROSCOPIC
Bacteria, UA: NONE SEEN
Bilirubin Urine: NEGATIVE
Glucose, UA: NEGATIVE mg/dL
Ketones, ur: NEGATIVE mg/dL
Nitrite: NEGATIVE
Protein, ur: NEGATIVE mg/dL
Specific Gravity, Urine: 1.018 (ref 1.005–1.030)
pH: 6 (ref 5.0–8.0)

## 2021-11-27 LAB — CBC WITH DIFFERENTIAL/PLATELET
Abs Immature Granulocytes: 0.01 10*3/uL (ref 0.00–0.07)
Basophils Absolute: 0 10*3/uL (ref 0.0–0.1)
Basophils Relative: 0 %
Eosinophils Absolute: 0.1 10*3/uL (ref 0.0–0.5)
Eosinophils Relative: 2 %
HCT: 37.8 % (ref 36.0–46.0)
Hemoglobin: 12.1 g/dL (ref 12.0–15.0)
Immature Granulocytes: 0 %
Lymphocytes Relative: 44 %
Lymphs Abs: 2 10*3/uL (ref 0.7–4.0)
MCH: 26.9 pg (ref 26.0–34.0)
MCHC: 32 g/dL (ref 30.0–36.0)
MCV: 84.2 fL (ref 80.0–100.0)
Monocytes Absolute: 0.3 10*3/uL (ref 0.1–1.0)
Monocytes Relative: 7 %
Neutro Abs: 2.1 10*3/uL (ref 1.7–7.7)
Neutrophils Relative %: 47 %
Platelets: 304 10*3/uL (ref 150–400)
RBC: 4.49 MIL/uL (ref 3.87–5.11)
RDW: 15.6 % — ABNORMAL HIGH (ref 11.5–15.5)
WBC: 4.5 10*3/uL (ref 4.0–10.5)
nRBC: 0 % (ref 0.0–0.2)

## 2021-11-27 LAB — RAPID HIV SCREEN (HIV 1/2 AB+AG)
HIV 1/2 Antibodies: NONREACTIVE
HIV-1 P24 Antigen - HIV24: NONREACTIVE

## 2021-11-27 LAB — POCT PREGNANCY, URINE: Preg Test, Ur: NEGATIVE

## 2021-11-27 MED ORDER — KETOROLAC TROMETHAMINE 30 MG/ML IJ SOLN: 30.0000 mg | Freq: Once | INTRAMUSCULAR | Status: DC

## 2021-11-28 LAB — BMP8+EGFR
BUN/Creatinine Ratio: 13 (ref 9–23)
BUN: 9 mg/dL (ref 6–20)
CO2: 23 mmol/L (ref 20–29)
Calcium: 9.5 mg/dL (ref 8.7–10.2)
Chloride: 103 mmol/L (ref 96–106)
Creatinine, Ser: 0.7 mg/dL (ref 0.57–1.00)
Glucose: 86 mg/dL (ref 70–99)
Potassium: 4.4 mmol/L (ref 3.5–5.2)
Sodium: 139 mmol/L (ref 134–144)
eGFR: 115 mL/min/{1.73_m2} (ref >59)

## 2021-11-30 ENCOUNTER — Ambulatory Visit (INDEPENDENT_AMBULATORY_CARE_PROVIDER_SITE_OTHER): Payer: 59 | Admitting: Family Medicine

## 2021-11-30 ENCOUNTER — Encounter: Payer: Self-pay | Admitting: Family Medicine

## 2021-11-30 VITALS — BP 134/75 | HR 89 | Ht 62.0 in | Wt 344.0 lb

## 2021-11-30 DIAGNOSIS — I872 Venous insufficiency (chronic) (peripheral): Secondary | ICD-10-CM | POA: Diagnosis not present

## 2021-11-30 DIAGNOSIS — R609 Edema, unspecified: Secondary | ICD-10-CM | POA: Diagnosis not present

## 2021-11-30 MED ORDER — FUROSEMIDE 20 MG PO TABS: 20.0000 mg | ORAL_TABLET | Freq: Every day | ORAL | 0 refills | Status: DC

## 2021-11-30 NOTE — Patient Instructions (Addendum)
I appreciate the opportunity to provide care to you today!   please pick up your medication at the pharmacy   Referrals today-  Vascular surgery   Please continue to a heart-healthy diet and increase your physical activities. Try to exercise for 63mns at least three times a week.      It was a pleasure to see you and I look forward to continuing to work together on your health and well-being. Please do not hesitate to call the office if you need care or have questions about your care.   Have a wonderful day and week. With Gratitude, GAlvira MondayMSN, FNP-BC

## 2021-11-30 NOTE — Progress Notes (Unsigned)
   Acute Office Visit  Subjective:     Patient ID: Jessica Sanchez, female    DOB: 01-23-1986, 36 y.o.   MRN: [id number]  Chief Complaint  Patient presents with   Leg Swelling    Pt c/o leg swelling onset 2-3 months.     HPI Patient is in today with c/o of lower extremities edema for 2 months that has not subsided. She c/o of a dull aching cramping feeling in her legs, with numbness and tingling. She reported that her legs feel tight, and states it hurts when she ambulates; no associated chest pain or shortness of breath, has no history of CHF, or cirrhosis, denies alcohol use, nor endorses any chest pain or shortness of breath. She denies pleuritic chest pain with no history of PEs or DVTs.      Review of Systems  HENT:  Negative for congestion, sinus pain and sore throat.   Cardiovascular:  Negative for chest pain and palpitations.  Musculoskeletal:        Leg swelling  Skin:  Negative for rash.  Neurological:  Negative for dizziness.  Psychiatric/Behavioral:  Negative for depression and memory loss.         Objective:    BP 134/75   Pulse 89   Ht '5\' 2"'$  (1.575 m)   Wt (!) 344 lb 0.6 oz (156.1 kg)   LMP 11/10/2021 (Exact Date)   SpO2 95%   BMI 62.93 kg/m    Physical Exam HENT:     Head: Normocephalic.  Cardiovascular:     Rate and Rhythm: Normal rate and regular rhythm.     Pulses: Normal pulses.     Heart sounds: Normal heart sounds.  Pulmonary:     Effort: Pulmonary effort is normal.     Breath sounds: Normal breath sounds.  Musculoskeletal:     Right lower leg: Edema present.     Left lower leg: Edema present.  Neurological:     Mental Status: She is alert.     No results found for any visits on 11/30/21.      Assessment & Plan:   Problem List Items Addressed This Visit       Other   Peripheral edema    -+2 lower extremities edema that has not subsided with conservative management -c/o of a dull aching cramping feeling in her legs, with  numbness and tingling -no history of CHF, or cirrhosis -denies alcohol use, nor endorses any chest pain or shortness of breath -denies pleuritic chest pain with no history of PEs or DVTs -referral placed to vascular surgery for further evaluation - Lasix ordered       Other Visit Diagnoses     Chronic venous insufficiency of lower extremity    -  Primary   Relevant Medications   furosemide (LASIX) 20 MG tablet   Other Relevant Orders   Ambulatory referral to Vascular Surgery       Meds ordered this encounter  Medications   furosemide (LASIX) 20 MG tablet    Sig: Take 1 tablet (20 mg total) by mouth daily.    Dispense:  30 tablet    Refill:  0    Return if symptoms worsen or fail to improve.  [name], FNP

## 2021-12-01 ENCOUNTER — Other Ambulatory Visit: Payer: Self-pay

## 2021-12-01 ENCOUNTER — Encounter (HOSPITAL_COMMUNITY): Payer: Self-pay | Admitting: Obstetrics & Gynecology

## 2021-12-01 ENCOUNTER — Encounter (HOSPITAL_COMMUNITY)
Admission: RE | Disposition: A | Discharge status: Home / Self Care | Payer: Self-pay | Source: Ambulatory Visit | Attending: Obstetrics & Gynecology

## 2021-12-01 ENCOUNTER — Ambulatory Visit (HOSPITAL_COMMUNITY)
Admission: RE | Admit: 2021-12-01 | Discharge: 2021-12-01 | Disposition: A | Discharge status: Home / Self Care | Payer: 59 | Source: Ambulatory Visit | Attending: Obstetrics & Gynecology | Admitting: Obstetrics & Gynecology

## 2021-12-01 ENCOUNTER — Ambulatory Visit (HOSPITAL_BASED_OUTPATIENT_CLINIC_OR_DEPARTMENT_OTHER): Payer: 59 | Admitting: Certified Registered"

## 2021-12-01 ENCOUNTER — Ambulatory Visit (HOSPITAL_COMMUNITY): Payer: 59 | Admitting: Certified Registered"

## 2021-12-01 DIAGNOSIS — N938 Other specified abnormal uterine and vaginal bleeding: Secondary | ICD-10-CM

## 2021-12-01 DIAGNOSIS — N939 Abnormal uterine and vaginal bleeding, unspecified: Secondary | ICD-10-CM

## 2021-12-01 DIAGNOSIS — Z7901 Long term (current) use of anticoagulants: Secondary | ICD-10-CM | POA: Insufficient documentation

## 2021-12-01 DIAGNOSIS — I10 Essential (primary) hypertension: Secondary | ICD-10-CM | POA: Insufficient documentation

## 2021-12-01 DIAGNOSIS — N920 Excessive and frequent menstruation with regular cycle: Secondary | ICD-10-CM | POA: Insufficient documentation

## 2021-12-01 DIAGNOSIS — G4730 Sleep apnea, unspecified: Secondary | ICD-10-CM | POA: Diagnosis not present

## 2021-12-01 DIAGNOSIS — Z87891 Personal history of nicotine dependence: Secondary | ICD-10-CM | POA: Insufficient documentation

## 2021-12-01 HISTORY — PX: DILATATION AND CURETTAGE/HYSTEROSCOPY WITH MINERVA: SHX6851

## 2021-12-01 SURGERY — DILATATION AND CURETTAGE/HYSTEROSCOPY WITH MINERVA
Anesthesia: General | Site: Vagina

## 2021-12-01 MED ORDER — ROCURONIUM BROMIDE 10 MG/ML (PF) SYRINGE
PREFILLED_SYRINGE | INTRAVENOUS | Status: AC
  Filled 2021-12-01: qty 10

## 2021-12-01 MED ORDER — DEXAMETHASONE SODIUM PHOSPHATE 10 MG/ML IJ SOLN
INTRAMUSCULAR | Status: DC | PRN
  Administered 2021-12-01: 10 mg via INTRAVENOUS

## 2021-12-01 MED ORDER — SODIUM CHLORIDE 0.9 % IR SOLN
Status: DC | PRN
  Administered 2021-12-01: 1000 mL

## 2021-12-01 MED ORDER — FENTANYL CITRATE (PF) 100 MCG/2ML IJ SOLN
INTRAMUSCULAR | Status: AC
  Filled 2021-12-01: qty 2

## 2021-12-01 MED ORDER — SEVOFLURANE IN SOLN
RESPIRATORY_TRACT | Status: AC
  Filled 2021-12-01: qty 250

## 2021-12-01 MED ORDER — ONDANSETRON HCL 4 MG/2ML IJ SOLN
INTRAMUSCULAR | Status: AC
  Filled 2021-12-01: qty 2

## 2021-12-01 MED ORDER — PROPOFOL 10 MG/ML IV BOLUS
INTRAVENOUS | Status: AC
  Filled 2021-12-01: qty 20

## 2021-12-01 MED ORDER — DEXAMETHASONE SODIUM PHOSPHATE 10 MG/ML IJ SOLN
INTRAMUSCULAR | Status: AC
  Filled 2021-12-01: qty 1

## 2021-12-01 MED ORDER — PHENYLEPHRINE 80 MCG/ML (10ML) SYRINGE FOR IV PUSH (FOR BLOOD PRESSURE SUPPORT)
PREFILLED_SYRINGE | INTRAVENOUS | Status: AC
  Filled 2021-12-01: qty 10

## 2021-12-01 MED ORDER — ROCURONIUM BROMIDE 10 MG/ML (PF) SYRINGE
PREFILLED_SYRINGE | INTRAVENOUS | Status: DC | PRN
  Administered 2021-12-01: 60 mg via INTRAVENOUS

## 2021-12-01 MED ORDER — KETOROLAC TROMETHAMINE 10 MG PO TABS: 10.0000 mg | ORAL_TABLET | Freq: Three times a day (TID) | ORAL | 0 refills | Status: DC | PRN

## 2021-12-01 MED ORDER — FENTANYL CITRATE (PF) 100 MCG/2ML IJ SOLN
INTRAMUSCULAR | Status: DC | PRN
  Administered 2021-12-01: 100 ug via INTRAVENOUS

## 2021-12-01 MED ORDER — ONDANSETRON 8 MG PO TBDP: 8.0000 mg | ORAL_TABLET | Freq: Three times a day (TID) | ORAL | 0 refills | Status: DC | PRN

## 2021-12-01 MED ORDER — ONDANSETRON HCL 4 MG/2ML IJ SOLN
INTRAMUSCULAR | Status: DC | PRN
  Administered 2021-12-01: 4 mg via INTRAVENOUS

## 2021-12-01 MED ORDER — KETOROLAC TROMETHAMINE 30 MG/ML IJ SOLN
30.0000 mg | Freq: Once | INTRAMUSCULAR | Status: AC
  Administered 2021-12-01: 30 mg via INTRAVENOUS
  Filled 2021-12-01: qty 1

## 2021-12-01 MED ORDER — HYDROCODONE-ACETAMINOPHEN 5-325 MG PO TABS: 1.0000 | ORAL_TABLET | Freq: Four times a day (QID) | ORAL | 0 refills | Status: DC | PRN

## 2021-12-01 MED ORDER — LIDOCAINE HCL (PF) 2 % IJ SOLN
INTRAMUSCULAR | Status: AC
  Filled 2021-12-01: qty 5

## 2021-12-01 MED ORDER — OXYCODONE HCL 5 MG/5ML PO SOLN: 5.0000 mg | Freq: Once | ORAL | Status: DC | PRN

## 2021-12-01 MED ORDER — POVIDONE-IODINE 10 % EX SWAB: 2.0000 | Freq: Once | CUTANEOUS | Status: DC

## 2021-12-01 MED ORDER — MIDAZOLAM HCL 5 MG/5ML IJ SOLN
INTRAMUSCULAR | Status: DC | PRN
  Administered 2021-12-01: 2 mg via INTRAVENOUS

## 2021-12-01 MED ORDER — DEXMEDETOMIDINE HCL IN NACL 80 MCG/20ML IV SOLN
INTRAVENOUS | Status: AC
  Filled 2021-12-01: qty 20

## 2021-12-01 MED ORDER — ONDANSETRON HCL 4 MG/2ML IJ SOLN: 4.0000 mg | Freq: Once | INTRAMUSCULAR | Status: DC | PRN

## 2021-12-01 MED ORDER — 0.9 % SODIUM CHLORIDE (POUR BTL) OPTIME
TOPICAL | Status: DC | PRN
  Administered 2021-12-01: 1000 mL

## 2021-12-01 MED ORDER — CEFAZOLIN SODIUM-DEXTROSE 2-4 GM/100ML-% IV SOLN
2.0000 g | INTRAVENOUS | Status: AC
  Administered 2021-12-01: 2 g via INTRAVENOUS
  Filled 2021-12-01: qty 100

## 2021-12-01 MED ORDER — PHENYLEPHRINE 80 MCG/ML (10ML) SYRINGE FOR IV PUSH (FOR BLOOD PRESSURE SUPPORT)
PREFILLED_SYRINGE | INTRAVENOUS | Status: DC | PRN
  Administered 2021-12-01: 160 ug via INTRAVENOUS

## 2021-12-01 MED ORDER — FENTANYL CITRATE PF 50 MCG/ML IJ SOSY: 25.0000 ug | PREFILLED_SYRINGE | INTRAMUSCULAR | Status: DC | PRN

## 2021-12-01 MED ORDER — PROPOFOL 10 MG/ML IV BOLUS
INTRAVENOUS | Status: DC | PRN
  Administered 2021-12-01: 200 mg via INTRAVENOUS

## 2021-12-01 MED ORDER — OXYCODONE HCL 5 MG PO TABS: 5.0000 mg | ORAL_TABLET | Freq: Once | ORAL | Status: DC | PRN

## 2021-12-01 MED ORDER — LACTATED RINGERS IV SOLN: INTRAVENOUS | Status: DC | PRN

## 2021-12-01 MED ORDER — SUGAMMADEX SODIUM 200 MG/2ML IV SOLN
INTRAVENOUS | Status: DC | PRN
  Administered 2021-12-01: 400 mg via INTRAVENOUS

## 2021-12-01 MED ORDER — MIDAZOLAM HCL 2 MG/2ML IJ SOLN
INTRAMUSCULAR | Status: AC
  Filled 2021-12-01: qty 2

## 2021-12-01 MED ORDER — LIDOCAINE 2% (20 MG/ML) 5 ML SYRINGE
INTRAMUSCULAR | Status: DC | PRN
  Administered 2021-12-01: 100 mg via INTRAVENOUS

## 2021-12-01 SURGICAL SUPPLY — 30 items
BAG HAMPER (MISCELLANEOUS) ×2 IMPLANT
CLOTH BEACON ORANGE TIMEOUT ST (SAFETY) ×2 IMPLANT
COVER LIGHT HANDLE STERIS (MISCELLANEOUS) ×4 IMPLANT
GAUZE 4X4 16PLY ~~LOC~~+RFID DBL (SPONGE) ×4 IMPLANT
GLOVE BIOGEL PI IND STRL 6.5 (GLOVE) IMPLANT
GLOVE BIOGEL PI IND STRL 7.0 (GLOVE) ×2 IMPLANT
GLOVE BIOGEL PI IND STRL 8 (GLOVE) ×1 IMPLANT
GLOVE BIOGEL PI INDICATOR 6.5 (GLOVE) ×1
GLOVE BIOGEL PI INDICATOR 7.0 (GLOVE) ×1
GLOVE BIOGEL PI INDICATOR 8 (GLOVE) ×1
GLOVE ECLIPSE 8.0 STRL XLNG CF (GLOVE) ×3 IMPLANT
GLOVE SURG SS PI 6.5 STRL IVOR (GLOVE) ×1 IMPLANT
GOWN STRL REUS W/TWL LRG LVL3 (GOWN DISPOSABLE) ×2 IMPLANT
GOWN STRL REUS W/TWL XL LVL3 (GOWN DISPOSABLE) ×2 IMPLANT
HANDPIECE ABLA MINERVA ENDO (MISCELLANEOUS) ×2 IMPLANT
INST SET HYSTEROSCOPY (KITS) ×2 IMPLANT
IV NS 1000ML (IV SOLUTION) ×2
IV NS 1000ML BAXH (IV SOLUTION) ×1 IMPLANT
KIT TURNOVER CYSTO (KITS) ×2 IMPLANT
MANIFOLD NEPTUNE II (INSTRUMENTS) ×2 IMPLANT
MARKER SKIN DUAL TIP RULER LAB (MISCELLANEOUS) ×2 IMPLANT
NS IRRIG 1000ML POUR BTL (IV SOLUTION) ×2 IMPLANT
PACK BASIC III (CUSTOM PROCEDURE TRAY) ×2
PACK SRG BSC III STRL LF ECLPS (CUSTOM PROCEDURE TRAY) ×1 IMPLANT
PAD ARMBOARD 7.5X6 YLW CONV (MISCELLANEOUS) ×2 IMPLANT
PAD TELFA 3X4 1S STER (GAUZE/BANDAGES/DRESSINGS) ×2 IMPLANT
SET BASIN LINEN APH (SET/KITS/TRAYS/PACK) ×2 IMPLANT
SET CYSTO W/LG BORE CLAMP LF (SET/KITS/TRAYS/PACK) ×2 IMPLANT
SHEET LAVH (DRAPES) ×2 IMPLANT
TUBE CONNECTING 12X1/4 (SUCTIONS) ×2 IMPLANT

## 2021-12-01 NOTE — Anesthesia Preprocedure Evaluation (Signed)
Anesthesia Evaluation  Patient identified by MRN, date of birth, ID band Patient awake    Reviewed: Allergy & Precautions, H&P , NPO status , Patient's Chart, lab work & pertinent test results, reviewed documented beta blocker date and time   Airway Mallampati: II  TM Distance: >3 FB Neck ROM: full    Dental no notable dental hx.    Pulmonary shortness of breath, sleep apnea , former smoker,    Pulmonary exam normal breath sounds clear to auscultation       Cardiovascular Exercise Tolerance: Good hypertension, negative cardio ROS   Rhythm:regular Rate:Normal     Neuro/Psych negative neurological ROS  negative psych ROS   GI/Hepatic Neg liver ROS, GERD  Medicated,  Endo/Other  Morbid obesity  Renal/GU negative Renal ROS  negative genitourinary   Musculoskeletal   Abdominal   Peds  Hematology negative hematology ROS (+)   Anesthesia Other Findings   Reproductive/Obstetrics negative OB ROS                             Anesthesia Physical  Anesthesia Plan  ASA: 3  Anesthesia Plan: General and General ETT   Post-op Pain Management:    Induction:   PONV Risk Score and Plan: Ondansetron  Airway Management Planned:   Additional Equipment:   Intra-op Plan:   Post-operative Plan:   Informed Consent: I have reviewed the patients History and Physical, chart, labs and discussed the procedure including the risks, benefits and alternatives for the proposed anesthesia with the patient or authorized representative who has indicated his/her understanding and acceptance.     Dental Advisory Given  Plan Discussed with: CRNA  Anesthesia Plan Comments:         Anesthesia Quick Evaluation

## 2021-12-01 NOTE — Transfer of Care (Signed)
Immediate Anesthesia Transfer of Care Note  Patient: Jessica Sanchez  Procedure(s) Performed: DILATATION AND CURETTAGE/HYSTEROSCOPY WITH MINERVA (Vagina )  Patient Location: PACU  Anesthesia Type:General  Level of Consciousness: awake, alert , oriented and patient cooperative  Airway & Oxygen Therapy: Patient Spontanous Breathing and Patient connected to face mask oxygen  Post-op Assessment: Report given to RN, Post -op Vital signs reviewed and stable and Patient moving all extremities  Post vital signs: Reviewed and stable  Last Vitals:  Vitals Value Taken Time  BP 98/43 12/01/21 1300  Temp 36.7 C 12/01/21 1256  Pulse 92 12/01/21 1256  Resp 32 12/01/21 1301  SpO2 97 % 12/01/21 1256  Vitals shown include unvalidated device data.  Last Pain:  Vitals:   12/01/21 0807  TempSrc: Oral  PainSc: 0-No pain         Complications: No notable events documented.

## 2021-12-01 NOTE — Anesthesia Procedure Notes (Signed)
Procedure Name: Intubation Date/Time: 12/01/2021 11:54 AM  Performed by: Myna Bright, CRNAPre-anesthesia Checklist: Patient identified, Emergency Drugs available, Suction available and Patient being monitored Patient Re-evaluated:Patient Re-evaluated prior to induction Oxygen Delivery Method: Circle system utilized Preoxygenation: Pre-oxygenation with 100% oxygen Induction Type: IV induction Ventilation: Mask ventilation without difficulty and Oral airway inserted - appropriate to patient size Laryngoscope Size: Mac and 3 Grade View: Grade II Tube type: Oral Tube size: 7.0 mm Number of attempts: 1 Airway Equipment and Method: Stylet Placement Confirmation: ETT inserted through vocal cords under direct vision, positive ETCO2 and breath sounds checked- equal and bilateral Secured at: 21 cm Tube secured with: Tape Dental Injury: Teeth and Oropharynx as per pre-operative assessment

## 2021-12-01 NOTE — Assessment & Plan Note (Signed)
-+  2 lower extremities edema that has not subsided with conservative management -c/o of a dull aching cramping feeling in her legs, with numbness and tingling -no history of CHF, or cirrhosis -denies alcohol use, nor endorses any chest pain or shortness of breath -denies pleuritic chest pain with no history of PEs or DVTs -referral placed to vascular surgery for further evaluation - Lasix ordered

## 2021-12-01 NOTE — H&P (Signed)
Preoperative History and Physical  Jessica Sanchez is a 36 y.o. G1P0010 with Patient's last menstrual period was 11/10/2021 (exact date). admitted for a hysteroscopy uterine curettage Minerva endometrial ablation.  She has sufferred for many many years with DUB She has been managed on COC, POP, IUD x 2 simultaneously,megestrol and myfembree All have been unsuccessful She is on eliquis chronically  PMH:    Past Medical History:  Diagnosis Date   Arthritis    right shoulder   Dyspnea    Dysrhythmia    hx of SVT   Fibroid (bleeding) (uterine)    Fibroid, uterine    H/O metrorrhagia    Hypertension    IUD (intrauterine device) in place 07/11/2015   Menorrhagia    Obesity, Class III, BMI 40-49.9 (morbid obesity) ([place]) 11/11/2018   Sleep apnea    uses CPAP - setting 11   SVT (supraventricular tachycardia) (HCC)     PSH:     Past Surgical History:  Procedure Laterality Date   HYSTEROSCOPY N/A 09/17/2020   Procedure: HYSTEROSCOPY;  Surgeon: [name], MD;  Location: [facility];  Service: Gynecology;  Laterality: N/A;   HYSTEROSCOPY WITH D & C N/A 09/27/2013   Procedure: DILATATION AND CURETTAGE /HYSTEROSCOPY and insertion of Mirena IUD ;  Surgeon: [name]. Harrington Challenger, MD;  Location: [facility];  Service: Gynecology;  Laterality: N/A;   IUD REMOVAL N/A 09/17/2020   Procedure: INTRAUTERINE DEVICE (IUD) REMOVAL (2);  Surgeon: [name], MD;  Location: [facility];  Service: Gynecology;  Laterality: N/A;   MYOMECTOMY  02/03/2011   Procedure: MYOMECTOMY;  Surgeon: [name], MD;  Location: [facility];  Service: Gynecology;  Laterality: N/A;   SVT ABLATION N/A 11/08/2018   Procedure: SVT ABLATION;  Surgeon: [name], MD;  Location: [facility];  Service: Cardiovascular;  Laterality: N/A;   SVT ABLATION N/A 12/26/2020   Procedure: SVT ABLATION;  Surgeon: [name], MD;  Location: [facility];  Service: Cardiovascular;  Laterality: N/A;   TONSILLECTOMY      POb/GynH:      OB  History     Gravida  1   Para      Term      Preterm      AB  1   Living         SAB  1   IAB      Ectopic      Multiple      Live Births              SH:   Social History   Tobacco Use   Smoking status: Former    Types: Cigarettes   Smokeless tobacco: Never   Tobacco comments:    socially  Media planner   Vaping Use: Never used  Substance Use Topics   Alcohol use: Yes    Comment: occasional   Drug use: No    FH:    Family History  Problem Relation Age of Onset   Cirrhosis Mother    Diabetes Mother    Cancer Maternal Grandfather    Hypertension Sister    Diabetes Sister    Anesthesia problems Neg Hx    Hypotension Neg Hx    Malignant hyperthermia Neg Hx    Pseudochol deficiency Neg Hx      Allergies:  Allergies  Allergen Reactions   Shellfish Allergy Shortness Of Breath, Itching and Swelling    Mouth became swollen, throat started  itching, and developed shortness of breath   Sulfa Antibiotics Itching, Shortness Of Breath and Swelling    Mouth became swollen, throat started itching, and developed shortness of breath   Adhesive [Tape] Itching    Depends on the type of medical tape    Medications:       Current Facility-Administered Medications:    ceFAZolin (ANCEF) IVPB 2g/100 mL premix, 2 g, Intravenous, On Call to OR, [name], MD   povidone-iodine 10 % swab 2 Application, 2 Application, Topical, Once, [name], MD  Review of Systems:   Review of Systems  Constitutional: Negative for fever, chills, weight loss, malaise/fatigue and diaphoresis.  HENT: Negative for hearing loss, ear pain, nosebleeds, congestion, sore throat, neck pain, tinnitus and ear discharge.   Eyes: Negative for blurred vision, double vision, photophobia, pain, discharge and redness.  Respiratory: Negative for cough, hemoptysis, sputum production, shortness of breath, wheezing and stridor.   Cardiovascular: Negative for chest pain, palpitations,  orthopnea, claudication, leg swelling and PND.  Gastrointestinal: Positive for abdominal pain. Negative for heartburn, nausea, vomiting, diarrhea, constipation, blood in stool and melena.  Genitourinary: Negative for dysuria, urgency, frequency, hematuria and flank pain.  Musculoskeletal: Negative for myalgias, back pain, joint pain and falls.  Skin: Negative for itching and rash.  Neurological: Negative for dizziness, tingling, tremors, sensory change, speech change, focal weakness, seizures, loss of consciousness, weakness and headaches.  Endo/Heme/Allergies: Negative for environmental allergies and polydipsia. Does not bruise/bleed easily.  Psychiatric/Behavioral: Negative for depression, suicidal ideas, hallucinations, memory loss and substance abuse. The patient is not nervous/anxious and does not have insomnia.      PHYSICAL EXAM:  Blood pressure (!) 140/87, pulse 82, temperature 98 F (36.7 C), temperature source Oral, resp. rate 18, last menstrual period 11/10/2021, SpO2 99 %.    Vitals reviewed. Constitutional: She is oriented to person, place, and time. She appears well-developed and well-nourished.  HENT:  Head: Normocephalic and atraumatic.  Right Ear: External ear normal.  Left Ear: External ear normal.  Nose: Nose normal.  Mouth/Throat: Oropharynx is clear and moist.  Eyes: Conjunctivae and EOM are normal. Pupils are equal, round, and reactive to light. Right eye exhibits no discharge. Left eye exhibits no discharge. No scleral icterus.  Neck: Normal range of motion. Neck supple. No tracheal deviation present. No thyromegaly present.  Cardiovascular: Normal rate, regular rhythm, normal heart sounds and intact distal pulses.  Exam reveals no gallop and no friction rub.   No murmur heard. Respiratory: Effort normal and breath sounds normal. No respiratory distress. She has no wheezes. She has no rales. She exhibits no tenderness.  GI: Soft. Bowel sounds are normal. She  exhibits no distension and no mass. There is tenderness. There is no rebound and no guarding.  Genitourinary:       Vulva is normal without lesions Vagina is pink moist without discharge Cervix normal in appearance and pap is normal Uterus is normal size, contour, position, consistency, mobility, non-tender Adnexa is negative with normal sized ovaries by sonogram  Musculoskeletal: Normal range of motion. She exhibits no edema and no tenderness.  Neurological: She is alert and oriented to person, place, and time. She has normal reflexes. She displays normal reflexes. No cranial nerve deficit. She exhibits normal muscle tone. Coordination normal.  Skin: Skin is warm and dry. No rash noted. No erythema. No pallor.  Psychiatric: She has a normal mood and affect. Her behavior is normal. Judgment and thought content normal.    Labs:  Results for orders placed or performed during the hospital encounter of 11/27/21 (from the past 336 hour(s))  CBC with Differential/Platelet   Collection Time: 11/27/21  2:49 PM  Result Value Ref Range   WBC 4.5 4.0 - 10.5 K/uL   RBC 4.49 3.87 - 5.11 MIL/uL   Hemoglobin 12.1 12.0 - 15.0 g/dL   HCT 37.8 36.0 - 46.0 %   MCV 84.2 80.0 - 100.0 fL   MCH 26.9 26.0 - 34.0 pg   MCHC 32.0 30.0 - 36.0 g/dL   RDW 15.6 (H) 11.5 - 15.5 %   Platelets 304 150 - 400 K/uL   nRBC 0.0 0.0 - 0.2 %   Neutrophils Relative % 47 %   Neutro Abs 2.1 1.7 - 7.7 K/uL   Lymphocytes Relative 44 %   Lymphs Abs 2.0 0.7 - 4.0 K/uL   Monocytes Relative 7 %   Monocytes Absolute 0.3 0.1 - 1.0 K/uL   Eosinophils Relative 2 %   Eosinophils Absolute 0.1 0.0 - 0.5 K/uL   Basophils Relative 0 %   Basophils Absolute 0.0 0.0 - 0.1 K/uL   Immature Granulocytes 0 %   Abs Immature Granulocytes 0.01 0.00 - 0.07 K/uL  Comprehensive metabolic panel   Collection Time: 11/27/21  2:49 PM  Result Value Ref Range   Sodium 136 135 - 145 mmol/L   Potassium 3.9 3.5 - 5.1 mmol/L   Chloride 105 98 - 111  mmol/L   CO2 25 22 - 32 mmol/L   Glucose, Bld 98 70 - 99 mg/dL   BUN 10 6 - 20 mg/dL   Creatinine, Ser 0.71 0.44 - 1.00 mg/dL   Calcium 9.1 8.9 - 10.3 mg/dL   Total Protein 7.4 6.5 - 8.1 g/dL   Albumin 3.7 3.5 - 5.0 g/dL   AST 12 (L) 15 - 41 U/L   ALT 12 0 - 44 U/L   Alkaline Phosphatase 57 38 - 126 U/L   Total Bilirubin 0.6 0.3 - 1.2 mg/dL   GFR, Estimated >60 >60 mL/min   Anion gap 6 5 - 15  Rapid HIV screen (HIV 1/2 Ab+Ag)   Collection Time: 11/27/21  2:49 PM  Result Value Ref Range   HIV-1 P24 Antigen - HIV24 NON REACTIVE NON REACTIVE   HIV 1/2 Antibodies NON REACTIVE NON REACTIVE   Interpretation (HIV Ag Ab)      A non reactive test result means that HIV 1 or HIV 2 antibodies and HIV 1 p24 antigen were not detected in the specimen.  Urinalysis, Routine w reflex microscopic Urine, Clean Catch   Collection Time: 11/27/21  2:49 PM  Result Value Ref Range   Color, Urine YELLOW YELLOW   APPearance CLEAR CLEAR   Specific Gravity, Urine 1.018 1.005 - 1.030   pH 6.0 5.0 - 8.0   Glucose, UA NEGATIVE NEGATIVE mg/dL   Hgb urine dipstick SMALL (A) NEGATIVE   Bilirubin Urine NEGATIVE NEGATIVE   Ketones, ur NEGATIVE NEGATIVE mg/dL   Protein, ur NEGATIVE NEGATIVE mg/dL   Nitrite NEGATIVE NEGATIVE   Leukocytes,Ua TRACE (A) NEGATIVE   RBC / HPF 6-10 0 - 5 RBC/hpf   WBC, UA 0-5 0 - 5 WBC/hpf   Bacteria, UA NONE SEEN NONE SEEN   Squamous Epithelial / LPF 0-5 0 - 5   Mucus PRESENT   Pregnancy, urine POC   Collection Time: 11/27/21  2:52 PM  Result Value Ref Range   Preg Test, Ur NEGATIVE NEGATIVE  Results for orders placed or  performed in visit on 11/18/21 (from the past 336 hour(s))  BMP8+EGFR   Collection Time: 11/27/21  1:51 PM  Result Value Ref Range   Glucose 86 70 - 99 mg/dL   BUN 9 6 - 20 mg/dL   Creatinine, Ser 0.70 0.57 - 1.00 mg/dL   eGFR 115 >59 mL/min/1.73   BUN/Creatinine Ratio 13 9 - 23   Sodium 139 134 - 144 mmol/L   Potassium 4.4 3.5 - 5.2 mmol/L   Chloride 103  96 - 106 mmol/L   CO2 23 20 - 29 mmol/L   Calcium 9.5 8.7 - 10.2 mg/dL    EKG: Orders placed or performed during the hospital encounter of 11/16/21   ED EKG   ED EKG   EKG 12-Lead   EKG 12-Lead   EKG    Imaging Studies: DG Chest Portable 1 View  Result Date: 11/16/2021 CLINICAL DATA:  Bilateral lower extremity swelling EXAM: PORTABLE CHEST 1 VIEW COMPARISON:  07/28/2021 FINDINGS: Single frontal view of the chest demonstrates an unremarkable cardiac silhouette allowing for portable AP technique. Mild central vascular congestion again noted without airspace disease, effusion, or pneumothorax. No acute bony abnormalities. IMPRESSION: 1. Chronic central vascular congestion. No acute airspace disease or effusion. Electronically Signed   By: Randa Ngo M.D.   On: 11/16/2021 17:36   US Venous Img Lower Bilateral  Result Date: 11/16/2021 CLINICAL DATA:  Leg swelling EXAM: BILATERAL LOWER EXTREMITY VENOUS DOPPLER ULTRASOUND TECHNIQUE: Gray-scale sonography with compression, as well as color and duplex ultrasound, were performed to evaluate the deep venous system(s) from the level of the common femoral vein through the popliteal and proximal calf veins. COMPARISON:  None Available. FINDINGS: VENOUS Normal compressibility of the common femoral, superficial femoral, and popliteal veins, as well as the visualized calf veins. Visualized portions of profunda femoral vein and great saphenous vein unremarkable. No filling defects to suggest DVT on grayscale or color Doppler imaging. Doppler waveforms show normal direction of venous flow, normal respiratory plasticity and response to augmentation. Limited views of the contralateral common femoral vein are unremarkable. OTHER Bilateral calf edema. Limitations: Technically limited by body habitus. IMPRESSION: No evidence of lower extremity DVT. Electronically Signed   By: Macy Mis M.D.   On: 11/16/2021 16:42      Assessment: DUB, chronic,  long-standing Unresponsive to all conservative measures(Double IUDs, megestrol, myfembree) Chronic eliquis therapy  Plan: Hysteroscopy uterine curettage Minerva endometrial ablation  [name] 12/01/2021 10:44 AM

## 2021-12-01 NOTE — Op Note (Signed)
Preoperative diagnosis:  1.   DUB, chronic, unresponsive to all conservative measures                                         2.  On chronic anti coagulation   Postoperative diagnoses: Same as above   Procedure: Hysteroscopy, endometrial ablation using Minerva  Surgeon: [name]   Anesthesia: Laryngeal mask airway  Findings: The endometrium was normal. There were no fibroid or other abnormalities.  Description of operation: The patient was taken to the operating room and placed in the supine position. She underwent general anesthesia using the laryngeal mask airway. She was placed in the dorsal lithotomy position and prepped and draped in the usual sterile fashion. A Graves speculum was placed and the anterior cervical lip was grasped with a single-tooth tenaculum. The cervix was dilated serially to allow passage of the hysteroscope. Diagnostic hysteroscopy was performed and was found to be normal.  I then proceeded to perform the Minerva endometrial ablation.   The uterus sounded to 8 cm The handpiece was attached to the Minerva power source/machine and the handpiece passed the checklist. The array was squeezed down to remove all of the air present.  The array was then place into the endometrial cavity and deployed to a length of 5 cm. The handpiece confirmed appropriate width by being in the green portion of the visual dial. The cervical cuff was then inflated to the point the CO2 indicator was in the green. The endometrial integrity check was then performed and integrity sequence was confirmed x 2. The heating was then begun and carried out for a total of 2 minutes(which is standard therapy time). When the plasma cycle was finished,  the cervical cuff was deflated and the array was removed with tissue present on the silicon membrane. There was appropriate post Minerva bleeding and uterine discharge.     All of the equipment worked well throughout the procedure.  The patient was  awakened from anesthesia and taken to the recovery room in good stable condition all counts were correct. She received 2 g of Ancef and 30 mg of Toradol preoperatively. She will be discharged from the recovery room and followed up in the office in 1- 2 weeks.   She can expect 4 weeks of post procedure bloody watery discharge  [name], MD  12/01/2021 12:37 PM

## 2021-12-02 ENCOUNTER — Encounter (HOSPITAL_COMMUNITY): Payer: Self-pay | Admitting: Obstetrics & Gynecology

## 2021-12-02 NOTE — Anesthesia Postprocedure Evaluation (Signed)
Anesthesia Post Note  Patient: Jessica Sanchez  Procedure(s) Performed: DILATATION AND CURETTAGE/HYSTEROSCOPY WITH MINERVA (Vagina )  Patient location during evaluation: Phase II Anesthesia Type: General Level of consciousness: awake Pain management: pain level controlled Vital Signs Assessment: post-procedure vital signs reviewed and stable Respiratory status: spontaneous breathing and respiratory function stable Cardiovascular status: blood pressure returned to baseline and stable Postop Assessment: no headache and no apparent nausea or vomiting Anesthetic complications: no Comments: Late entry   No notable events documented.   Last Vitals:  Vitals:   12/01/21 1345 12/01/21 1414  BP: 120/75 124/79  Pulse: 85 84  Resp: (!) 21 13  Temp:  36.7 C  SpO2: 95% 93%    Last Pain:  Vitals:   12/01/21 1418  TempSrc:   PainSc: [place]

## 2021-12-04 ENCOUNTER — Telehealth: Payer: Self-pay | Admitting: Obstetrics & Gynecology

## 2021-12-04 NOTE — Telephone Encounter (Signed)
This is all normal and the pain should ease with some time, she can alternate the toradol with the lortab plus use a heating pad  DR Elonda Husky

## 2021-12-04 NOTE — Telephone Encounter (Signed)
Pt aware of Dr. Brynda Greathouse recommendations and voiced understanding. [place]

## 2021-12-04 NOTE — Telephone Encounter (Signed)
Pt had a D&C on 8/1. Pt is having abd pain, back pain and pain in thighs. Pt is taking pain med prn but it's not easing the pain. Pt had vomiting 1 time. She is taking nausea med. I advised I feel like all this is normal but I would have you review. Thanks! [place]

## 2021-12-04 NOTE — Telephone Encounter (Signed)
Pt is asking for a call back. Pt states she is having back and abdominal pain. Pt states her heart is racing. Please advise.

## 2021-12-10 ENCOUNTER — Ambulatory Visit: Admit: 2021-12-10 | Payer: 59 | Admitting: Obstetrics & Gynecology

## 2021-12-10 SURGERY — DILATATION AND CURETTAGE/HYSTEROSCOPY WITH MINERVA
Anesthesia: Choice

## 2021-12-14 ENCOUNTER — Emergency Department (HOSPITAL_COMMUNITY): Payer: 59

## 2021-12-14 ENCOUNTER — Ambulatory Visit
Admission: EM | Admit: 2021-12-14 | Discharge: 2021-12-14 | Disposition: A | Discharge status: Home / Self Care | Payer: 59

## 2021-12-14 ENCOUNTER — Encounter (HOSPITAL_COMMUNITY): Payer: Self-pay | Admitting: Emergency Medicine

## 2021-12-14 ENCOUNTER — Emergency Department (HOSPITAL_COMMUNITY)
Admission: EM | Admit: 2021-12-14 | Discharge: 2021-12-14 | Disposition: A | Discharge status: Home / Self Care | Payer: 59 | Attending: Emergency Medicine | Admitting: Emergency Medicine

## 2021-12-14 ENCOUNTER — Encounter: Payer: Self-pay | Admitting: Emergency Medicine

## 2021-12-14 ENCOUNTER — Other Ambulatory Visit: Payer: Self-pay

## 2021-12-14 DIAGNOSIS — R109 Unspecified abdominal pain: Secondary | ICD-10-CM | POA: Diagnosis not present

## 2021-12-14 DIAGNOSIS — R1084 Generalized abdominal pain: Secondary | ICD-10-CM

## 2021-12-14 DIAGNOSIS — Z7901 Long term (current) use of anticoagulants: Secondary | ICD-10-CM | POA: Insufficient documentation

## 2021-12-14 DIAGNOSIS — N3090 Cystitis, unspecified without hematuria: Secondary | ICD-10-CM | POA: Diagnosis not present

## 2021-12-14 DIAGNOSIS — I10 Essential (primary) hypertension: Secondary | ICD-10-CM | POA: Diagnosis not present

## 2021-12-14 DIAGNOSIS — M545 Low back pain, unspecified: Secondary | ICD-10-CM

## 2021-12-14 DIAGNOSIS — Z79899 Other long term (current) drug therapy: Secondary | ICD-10-CM | POA: Insufficient documentation

## 2021-12-14 LAB — COMPREHENSIVE METABOLIC PANEL
ALT: 14 U/L (ref 0–44)
AST: 14 U/L — ABNORMAL LOW (ref 15–41)
Albumin: 3.6 g/dL (ref 3.5–5.0)
Alkaline Phosphatase: 55 U/L (ref 38–126)
Anion gap: 8 (ref 5–15)
BUN: 10 mg/dL (ref 6–20)
CO2: 24 mmol/L (ref 22–32)
Calcium: 8.6 mg/dL — ABNORMAL LOW (ref 8.9–10.3)
Chloride: 105 mmol/L (ref 98–111)
Creatinine, Ser: 0.68 mg/dL (ref 0.44–1.00)
GFR, Estimated: >60 mL/min (ref >60)
Glucose, Bld: 85 mg/dL (ref 70–99)
Potassium: 3.8 mmol/L (ref 3.5–5.1)
Sodium: 137 mmol/L (ref 135–145)
Total Bilirubin: 0.7 mg/dL (ref 0.3–1.2)
Total Protein: 7 g/dL (ref 6.5–8.1)

## 2021-12-14 LAB — CBC
HCT: 36.4 % (ref 36.0–46.0)
Hemoglobin: 11.4 g/dL — ABNORMAL LOW (ref 12.0–15.0)
MCH: 27.1 pg (ref 26.0–34.0)
MCHC: 31.3 g/dL (ref 30.0–36.0)
MCV: 86.7 fL (ref 80.0–100.0)
Platelets: 227 10*3/uL (ref 150–400)
RBC: 4.2 MIL/uL (ref 3.87–5.11)
RDW: 15.7 % — ABNORMAL HIGH (ref 11.5–15.5)
WBC: 7 10*3/uL (ref 4.0–10.5)
nRBC: 0 % (ref 0.0–0.2)

## 2021-12-14 LAB — URINALYSIS, ROUTINE W REFLEX MICROSCOPIC
Bilirubin Urine: NEGATIVE
Glucose, UA: NEGATIVE mg/dL
Ketones, ur: 5 mg/dL — AB
Nitrite: NEGATIVE
Protein, ur: 30 mg/dL — AB
Specific Gravity, Urine: 1.027 (ref 1.005–1.030)
WBC, UA: >50 WBC/hpf — ABNORMAL HIGH (ref 0–5)
pH: 5 (ref 5.0–8.0)

## 2021-12-14 LAB — LIPASE, BLOOD: Lipase: 21 U/L (ref 11–51)

## 2021-12-14 LAB — HCG, SERUM, QUALITATIVE: Preg, Serum: NEGATIVE

## 2021-12-14 MED ORDER — CEPHALEXIN 500 MG PO CAPS: 500.0000 mg | ORAL_CAPSULE | Freq: Four times a day (QID) | ORAL | 0 refills | Status: DC

## 2021-12-14 MED ORDER — IOHEXOL 300 MG/ML  SOLN
100.0000 mL | Freq: Once | INTRAMUSCULAR | Status: AC | PRN
  Administered 2021-12-14: 100 mL via INTRAVENOUS

## 2021-12-14 MED ORDER — KETOROLAC TROMETHAMINE 15 MG/ML IJ SOLN
15.0000 mg | Freq: Once | INTRAMUSCULAR | Status: AC
  Administered 2021-12-14: 15 mg via INTRAVENOUS
  Filled 2021-12-14: qty 1

## 2021-12-14 MED ORDER — OXYCODONE-ACETAMINOPHEN 5-325 MG PO TABS
2.0000 | ORAL_TABLET | Freq: Once | ORAL | Status: AC
  Administered 2021-12-14: 2 via ORAL
  Filled 2021-12-14: qty 2

## 2021-12-14 MED ORDER — LACTATED RINGERS IV BOLUS
1000.0000 mL | Freq: Once | INTRAVENOUS | Status: AC
  Administered 2021-12-14: 1000 mL via INTRAVENOUS

## 2021-12-14 MED ORDER — SODIUM CHLORIDE 0.9 % IV SOLN
1.0000 g | Freq: Once | INTRAVENOUS | Status: AC
  Administered 2021-12-14: 1 g via INTRAVENOUS
  Filled 2021-12-14: qty 10

## 2021-12-14 NOTE — ED Provider Notes (Signed)
[facility] EMERGENCY DEPARTMENT Provider Note   CSN: [id number] Arrival date & time: 12/14/21  1328     History {Add pertinent medical, surgical, social history, OB history to HPI:1} Chief Complaint  Patient presents with   Abdominal Pain    Jessica Sanchez is a 36 y.o. female.   Abdominal Pain Associated symptoms: chills   Patient presents for lower back pain and abdominal pain.  Medical history includes SVT, GERD, fibroids, menorrhagia, OSA, HTN, and arthritis.  Due to her DU B, 2 weeks ago she underwent hysteroscopy with endometrial ablation with Dr. Elonda Husky.  Since her procedure, she has been having some mild ongoing lower abdominal pain and some mild spotting.  2 nights ago, she developed a worsening severity of lower abdominal pain that stretches across her lower abdomen.  Pain radiates to her lower back.  She denies any new onset of bleeding or discharge.  She denies any burning sensation with urination but does report worsening lower abdominal pain with urination.  Patient has not had nausea.  Last night, she did have a transient episode of chills.  Currently, she has a follow-up appointment with Dr. Elonda Husky for tomorrow.  For pain at home, she has taken her narcotic pain medication as well as some ibuprofen.  She has not taken anything today.  Current pain is 10/10 in severity.     Home Medications Prior to Admission medications   Medication Sig Start Date End Date Taking? Authorizing Provider  albuterol (VENTOLIN HFA) 108 (90 Base) MCG/ACT inhaler Inhale 2 puffs into the lungs every 6 (six) hours as needed for wheezing or shortness of breath. 10/26/21   [name], FNP  flecainide (TAMBOCOR) 100 MG tablet Take 150 mg by mouth 2 (two) times daily.    [provider]  furosemide (LASIX) 20 MG tablet Take 1 tablet (20 mg total) by mouth daily. 11/30/21   [name], FNP  hydrochlorothiazide (MICROZIDE) 12.5 MG capsule Take 1 capsule (12.5 mg total) by mouth  daily. Patient not taking: Reported on 11/30/2021 10/26/21   [name], FNP  HYDROcodone-acetaminophen (NORCO/VICODIN) 5-325 MG tablet Take 1 tablet by mouth every 6 (six) hours as needed. 12/01/21   [name], MD  ketorolac (TORADOL) 10 MG tablet Take 1 tablet (10 mg total) by mouth every 8 (eight) hours as needed. 12/01/21   [name], MD  Multiple Vitamins-Calcium (ONE-A-DAY WOMENS PO) Take 1 tablet by mouth daily.    [provider]  ondansetron (ZOFRAN-ODT) 8 MG disintegrating tablet Take 1 tablet (8 mg total) by mouth every 8 (eight) hours as needed for nausea or vomiting. 12/01/21   [name], MD  potassium chloride SA (KLOR-CON M) 20 MEQ tablet Take 1 tablet (20 mEq total) by mouth 2 (two) times daily. 11/18/21   Fayrene Helper, MD  Vitamin D, Ergocalciferol, (DRISDOL) 1.25 MG (50000 UNIT) CAPS capsule Take 1 capsule (50,000 Units total) by mouth every 7 (seven) days. 10/28/21   [name], FNP  apixaban (ELIQUIS) 5 MG TABS tablet Take 2 tablets ('10mg'$ ) twice daily for 7 days, then 1 tablet ('5mg'$ ) twice daily 04/20/19 04/20/19  Rodell Perna A, PA-C  levonorgestrel (MIRENA) 20 MCG/24HR IUD 1 each by Intrauterine route once.   06/25/20  [provider]      Allergies    Shellfish allergy, Sulfa antibiotics, and Adhesive [tape]    Review of Systems   Review of Systems  Constitutional:  Positive for chills.  Gastrointestinal:  Positive for  abdominal pain.  Musculoskeletal:  Positive for back pain.  All other systems reviewed and are negative.   Physical Exam Updated Vital Signs BP (!) 137/100   Pulse 79   Temp 97.8 F (36.6 C) (Oral)   Resp 19   Ht '5\' 2"'$  (1.575 m)   Wt (!) 156.1 kg   LMP 11/10/2021 (Exact Date) Comment: Urine pregnancy negative on 11-27-2021  SpO2 98%   BMI 62.94 kg/m  Physical Exam Vitals and nursing note reviewed.  Constitutional:      General: She is not in acute distress.    Appearance: She is well-developed. She is  not ill-appearing, toxic-appearing or diaphoretic.  HENT:     Head: Normocephalic and atraumatic.     Mouth/Throat:     Mouth: Mucous membranes are moist.     Pharynx: Oropharynx is clear.  Eyes:     General: No scleral icterus.    Conjunctiva/sclera: Conjunctivae normal.  Cardiovascular:     Rate and Rhythm: Normal rate and regular rhythm.     Heart sounds: No murmur heard. Pulmonary:     Effort: Pulmonary effort is normal. No respiratory distress.  Abdominal:     Palpations: Abdomen is soft.     Tenderness: There is abdominal tenderness in the right lower quadrant, suprapubic area and left lower quadrant. There is no right CVA tenderness, left CVA tenderness, guarding or rebound.  Musculoskeletal:        General: No swelling.     Cervical back: Neck supple.  Skin:    General: Skin is warm and dry.  Neurological:     General: No focal deficit present.     Mental Status: She is alert and oriented to person, place, and time.  Psychiatric:        Mood and Affect: Mood normal.        Behavior: Behavior normal.     ED Results / Procedures / Treatments   Labs (all labs ordered are listed, but only abnormal results are displayed) Labs Reviewed  COMPREHENSIVE METABOLIC PANEL - Abnormal; Notable for the following components:      Result Value   Calcium 8.6 (*)    AST 14 (*)    All other components within normal limits  CBC - Abnormal; Notable for the following components:   Hemoglobin 11.4 (*)    RDW 15.7 (*)    All other components within normal limits  LIPASE, BLOOD  URINALYSIS, ROUTINE W REFLEX MICROSCOPIC    EKG None  Radiology No results found.  Procedures Procedures  {Document cardiac monitor, telemetry assessment procedure when appropriate:1}  Medications Ordered in ED Medications - No data to display  ED Course/ Medical Decision Making/ A&P                           Medical Decision Making Amount and/or Complexity of Data Reviewed Labs:  ordered.   ***  {Document critical care time when appropriate:1} {Document review of labs and clinical decision tools ie heart score, Chads2Vasc2 etc:1}  {Document your independent review of radiology images, and any outside records:1} {Document your discussion with family members, caretakers, and with consultants:1} {Document social determinants of health affecting pt's care:1} {Document your decision making why or why not admission, treatments were needed:1} Final Clinical Impression(s) / ED Diagnoses Final diagnoses:  None    Rx / DC Orders ED Discharge Orders     None

## 2021-12-14 NOTE — ED Notes (Signed)
Patient is being discharged from the Urgent Care and sent to the Emergency Department via [facility]. Per Noemi Chapel NP, patient is in need of higher level of care due to need for further evaluation, and possible imaging. Patient is aware and verbalizes understanding of plan of care.  Vitals:   12/14/21 1258  BP: (!) 165/83  Pulse: 91  Resp: 18  Temp: 98 F (36.7 C)  SpO2: 95%

## 2021-12-14 NOTE — ED Triage Notes (Signed)
Pt presents with abdominal pain and back pain since last night, had gyn surgery on 12/01/21.

## 2021-12-14 NOTE — ED Provider Notes (Signed)
Patient seen and evaluated in triage.  Reports surgery about 2 weeks ago.   Yesterday, started having severe lower back and abdominal pain that she ports is worse than when she had surgery.  She called her surgeon's office who told her to go emergency room.  Patient came to urgent care.  In triage, vital signs are stable, she is slightly hypertensive, however oxygenating well and pulse rate is normal.  I discussed with her that given recent surgery, recommendations by surgeon, and significant pain she is having warrant further evaluation and management in the emergency room.  Patient is in agreement to this plan.  Patient stable to be transported via private vehicle at this time.  Patient reports her friend drove her to urgent care and is agreeable to drive her to the emergency room.   Eulogio Bear, NP 12/14/21 [phone]

## 2021-12-14 NOTE — ED Notes (Signed)
Patient transported to CT 

## 2021-12-14 NOTE — ED Triage Notes (Signed)
Pt presents with lower back and abdominal pain that started yesterday. Denies dysuria.  States had surgery on 8/01 and today is the worst pain since the surgery.

## 2021-12-14 NOTE — ED Notes (Signed)
ED Provider at bedside. 

## 2021-12-14 NOTE — Discharge Instructions (Signed)
Keep your follow-up appointment with Dr. Elonda Husky for tomorrow.  You received your first dose of antibiotics for UTI here in the emergency department.  A prescription for continued antibiotics was sent to your pharmacy.  Take this as prescribed.  Urine culture results should be available within the next 48 hours.  Please follow-up with your primary care doctor to discuss culture results and possible need for changing or discontinuing antibiotics.  Continue to treat your pain with ibuprofen and Tylenol.  Take narcotic pain medication as needed.  Return to the emergency department for any new or worsening symptoms of concern.

## 2021-12-15 ENCOUNTER — Ambulatory Visit (INDEPENDENT_AMBULATORY_CARE_PROVIDER_SITE_OTHER): Payer: 59 | Admitting: Obstetrics & Gynecology

## 2021-12-15 ENCOUNTER — Encounter: Payer: Self-pay | Admitting: Obstetrics & Gynecology

## 2021-12-15 VITALS — BP 139/89 | HR 86 | Wt 348.0 lb

## 2021-12-15 DIAGNOSIS — Z9889 Other specified postprocedural states: Secondary | ICD-10-CM

## 2021-12-15 NOTE — Progress Notes (Signed)
  HPI: Patient returns for routine postoperative follow-up having undergone hysteroscopy D&C ablation  on 12/01/21.  The patient's immediate postoperative recovery has been unremarkable. Since hospital discharge the patient reports was seen in ED yesterday ? UTI on keflex.   Current Outpatient Medications: albuterol (VENTOLIN HFA) 108 (90 Base) MCG/ACT inhaler, Inhale 2 puffs into the lungs every 6 (six) hours as needed for wheezing or shortness of breath., Disp: 8 g, Rfl: 0 flecainide (TAMBOCOR) 100 MG tablet, Take 150 mg by mouth 2 (two) times daily., Disp: , Rfl:  furosemide (LASIX) 20 MG tablet, Take 1 tablet (20 mg total) by mouth daily., Disp: 30 tablet, Rfl: 0 HYDROcodone-acetaminophen (NORCO/VICODIN) 5-325 MG tablet, Take 1 tablet by mouth every 6 (six) hours as needed., Disp: 10 tablet, Rfl: 0 ketorolac (TORADOL) 10 MG tablet, Take 1 tablet (10 mg total) by mouth every 8 (eight) hours as needed., Disp: 15 tablet, Rfl: 0 Multiple Vitamins-Calcium (ONE-A-DAY WOMENS PO), Take 1 tablet by mouth daily., Disp: , Rfl:  potassium chloride SA (KLOR-CON M) 20 MEQ tablet, Take 1 tablet (20 mEq total) by mouth 2 (two) times daily., Disp: 40 tablet, Rfl: 0 Vitamin D, Ergocalciferol, (DRISDOL) 1.25 MG (50000 UNIT) CAPS capsule, Take 1 capsule (50,000 Units total) by mouth every 7 (seven) days., Disp: 5 capsule, Rfl: 1 cephALEXin (KEFLEX) 500 MG capsule, Take 1 capsule (500 mg total) by mouth 4 (four) times daily. (Patient not taking: Reported on 12/15/2021), Disp: 20 capsule, Rfl: 0 hydrochlorothiazide (MICROZIDE) 12.5 MG capsule, Take 1 capsule (12.5 mg total) by mouth daily. (Patient not taking: Reported on 11/30/2021), Disp: 30 capsule, Rfl: 0 ondansetron (ZOFRAN-ODT) 8 MG disintegrating tablet, Take 1 tablet (8 mg total) by mouth every 8 (eight) hours as needed for nausea or vomiting. (Patient not taking: Reported on 12/15/2021), Disp: 8 tablet, Rfl: 0  No current facility-administered medications for  this visit.    Blood pressure 139/89, pulse 86, weight (!) 348 lb (157.9 kg), last menstrual period 11/10/2021.  Physical Exam: Normal post ablation  Diagnostic Tests:   Pathology: benign  Impression + Management plan: S/p endometrial ablation    Medications Prescribed this encounter: No orders of the defined types were placed in this encounter.     Follow up: prn   [name], MD Attending Physician for the [facility] 12/15/2021 2:05 PM

## 2021-12-16 LAB — URINE CULTURE: Culture: 20000 — AB

## 2021-12-21 ENCOUNTER — Encounter: Payer: Self-pay | Admitting: Allergy & Immunology

## 2021-12-21 ENCOUNTER — Ambulatory Visit: Payer: Self-pay | Admitting: Allergy & Immunology

## 2021-12-21 ENCOUNTER — Ambulatory Visit (INDEPENDENT_AMBULATORY_CARE_PROVIDER_SITE_OTHER): Payer: 59 | Admitting: Allergy & Immunology

## 2021-12-21 VITALS — BP 118/68 | HR 95 | Temp 97.2°F | Resp 16 | Ht 62.0 in | Wt 340.0 lb

## 2021-12-21 DIAGNOSIS — J4530 Mild persistent asthma, uncomplicated: Secondary | ICD-10-CM

## 2021-12-21 DIAGNOSIS — T7800XA Anaphylactic reaction due to unspecified food, initial encounter: Secondary | ICD-10-CM | POA: Diagnosis not present

## 2021-12-21 DIAGNOSIS — T7800XD Anaphylactic reaction due to unspecified food, subsequent encounter: Secondary | ICD-10-CM

## 2021-12-21 DIAGNOSIS — J3089 Other allergic rhinitis: Secondary | ICD-10-CM | POA: Diagnosis not present

## 2021-12-21 DIAGNOSIS — L508 Other urticaria: Secondary | ICD-10-CM

## 2021-12-21 MED ORDER — FAMOTIDINE 40 MG PO TABS: 40.0000 mg | ORAL_TABLET | Freq: Two times a day (BID) | ORAL | 5 refills | Status: DC

## 2021-12-21 MED ORDER — FLUTICASONE FUROATE-VILANTEROL 100-25 MCG/ACT IN AEPB: 1.0000 | INHALATION_SPRAY | Freq: Every day | RESPIRATORY_TRACT | 5 refills | Status: DC

## 2021-12-21 MED ORDER — LEVOCETIRIZINE DIHYDROCHLORIDE 5 MG PO TABS: 5.0000 mg | ORAL_TABLET | Freq: Two times a day (BID) | ORAL | 5 refills | Status: DC

## 2021-12-21 NOTE — Patient Instructions (Addendum)
1. Chronic urticaria - You did have several items on testing that would explain your urticaria (hives) and itching. - However, we are going to get a bit of blood work to make sure we are not missing something bigger.  - We we will call you in 1 to 2 weeks to the results of the testing. - Chronic hives are often times a self limited process and will "burn themselves out" over 6-12 months, although this is not always the case.  - In the meantime, start suppressive dosing of antihistamines:   - Morning: Xyzal (levocetirizine) '5mg'$  + Pepcid '40mg'$   - Evening: Xyzal (levocetirizine) '5mg'$  + Pepcid '40mg'$  - You can change this dosing at home, decreasing the dose as needed or increasing the dosing as needed.  - If you are not tolerating the medications or are tired of taking them every day, we can start treatment with a monthly injectable medication called Xolair.   2. Seasonal and perennial allergic rhinitis - Testing today showed: grasses, ragweed, weeds, trees, indoor molds, outdoor molds, dust mites, cat, dog, cockroach, and tobacco . - Copy of test results provided.  - Avoidance measures provided. - Start taking: antihistamines as above for the hives - Consider nasal saline rinses 1-2 times daily to remove allergens from the nasal cavities as well as help with mucous clearance (this is especially helpful to do before the nasal sprays are given) - Consider allergy shots as a means of long-term control. - Allergy shots "re-train" and "reset" the immune system to ignore environmental allergens and decrease the resulting immune response to those allergens (sneezing, itchy watery eyes, runny nose, nasal congestion, etc).    - Allergy shots improve symptoms in 75-85% of patients.  - We can discuss more at the next appointment if the medications are not working for you.  3. Anaphylactic shock due to food (seafood) - Testing was negative to the most common foods. - Because of your reaction to the shellfish,  we are going to confirm that with blood work. - EpiPen training reviewed.  - We will call you in 1-2 weeks with the results of the testing.   4. Mild persistent asthma, uncomplicated - Lung testing was in the 60% range, but it did improve albeit slightly with the albuterol treatment. - This points towards a diagnosis of asthma, but does not rule it in definitively. - However, all of your symptoms are consistent with asthma as well as the fact that you have bene diagnosed with asthma in the past.  - We are going to start Breo 12mg one puff once daily (this contains a long acting albuterol combined with an inhaled steroid. - Daily controller medication(s): Breo 100/262m one puff once daily - Prior to physical activity: albuterol 2 puffs 10-15 minutes before physical activity. - Rescue medications: albuterol 4 puffs every 4-6 hours as needed - Asthma control goals:  * Full participation in all desired activities (may need albuterol before activity) * Albuterol use two time or less a week on average (not counting use with activity) * Cough interfering with sleep two time or less a month * Oral steroids no more than once a year * No hospitalizations  5. Return in about 6 weeks (around 02/01/2022).    Please inform us[facility]oreaf any Emergency Department visits, hospitalizations, or changes in symptoms. Call usKoreaefore going to the ED for breathing or allergy symptoms since we might be able to fit you in for a sick visit. Feel free to contact us[place]orea  anytime with any questions, problems, or concerns.  It was a pleasure to meet you today!  Websites that have reliable patient information: 1. American Academy of Asthma, Allergy, and Immunology: www.aaaai.org 2. Food Allergy Research and Education (FARE): foodallergy.org 3. Mothers of Asthmatics: http://www.asthmacommunitynetwork.org 4. American College of Allergy, Asthma, and Immunology: www.acaai.org   COVID-19 Vaccine Information can be found at:  [email or web address] For questions related to vaccine distribution or appointments, please email [email or web address] or call [phone].   We realize that you might be concerned about having an allergic reaction to the COVID19 vaccines. To help with that concern, WE ARE OFFERING THE COVID19 VACCINES IN OUR OFFICE! Ask the front desk for dates!     "Like" [place] on Facebook and Instagram for our latest updates!      A healthy democracy works best when [employer] participate! Make sure you are registered to vote! If you have moved or changed any of your contact information, you will need to get this updated before voting!  In some cases, you MAY be able to register to vote online: [email or web address]      Airborne Adult Perc - 12/21/21 1455     Time Antigen Placed [place] Lavella Hammock    Location Back    Number of Test 59    1. Control-Buffer 50% Glycerol Negative    2. Control-Histamine 1 mg/ml 2+    3. Albumin saline Negative    4. [place] 2+    5. [place] 2+    6. Johnson Negative    7. [place] Blue 2+    8. Meadow Fescue 2+    9. Perennial Rye 2+    10. Sweet Vernal Negative    11. Timothy 2+    12. Cocklebur 2+    13. Burweed Marshelder 2+    14. Ragweed, short Negative    15. Ragweed, Giant 2+    16. Plantain,  English 2+    17. Lamb's Quarters 2+    18. Sheep Sorrell 2+    19. Rough Pigweed 2+    20. Marsh Elder, Rough Negative    21. Mugwort, Common 2+    22. Ash mix Negative    23. Birch mix Negative    24. Beech American 2+    25. Box, Elder 2+    26. Cedar, red 2+    27. Cottonwood, Eastern 2+    28. Elm mix 2+    29. Hickory 2+    30. Maple mix Negative    31. Oak, [place] mix Negative    32. Pecan Pollen 2+    33. Pine mix Negative    34. Sycamore Eastern Negative    35. Belfry, Black Pollen 2+    36. Alternaria alternata Negative    37.  Cladosporium Herbarum Negative    38. Aspergillus mix Negative    39. Penicillium mix Negative    40. Bipolaris sorokiniana (Helminthosporium) Negative    41. Drechslera spicifera (Curvularia) 2+    42. Mucor plumbeus Negative    43. Fusarium moniliforme 2+    44. Aureobasidium pullulans (pullulara) 2+    45. Rhizopus oryzae 2+    46. Botrytis cinera 2+    47. Epicoccum nigrum 2+    48. Phoma betae Negative    49. Candida Albicans Negative    50. Trichophyton mentagrophytes 2+    51. Mite, D Farinae  5,000 AU/ml 2+    52. Mite, D Pteronyssinus  5,000 AU/ml Negative  53. Cat Hair 10,000 BAU/ml 2+    54.  Dog Epithelia 2+    55. Mixed Feathers 2+    56. Horse Epithelia 2+    57. Cockroach, German 2+    58. Mouse Negative    59. Tobacco Leaf 2+             Food Perc - 12/21/21 1455       Test Information   Time Antigen Placed 1456    Allergen Manufacturer Lavella Hammock    Location Back    Number of allergen test 10      Food   1. Peanut Negative    2. Soybean food Negative    3. Wheat, whole Negative    4. Sesame Negative    5. Milk, cow Negative    6. Egg White, chicken Negative    7. Casein Negative    8. Shellfish mix Negative    9. Fish mix Negative    10. Cashew Negative             Reducing Pollen Exposure  The American Academy of Allergy, Asthma and Immunology suggests the following steps to reduce your exposure to pollen during allergy seasons.    Do not hang sheets or clothing out to dry; pollen may collect on these items. Do not mow lawns or spend time around freshly cut grass; mowing stirs up pollen. Keep windows closed at night.  Keep car windows closed while driving. Minimize morning activities outdoors, a time when pollen counts are usually at their highest. Stay indoors as much as possible when pollen counts or humidity is high and on windy days when pollen tends to remain in the air longer. Use air conditioning when possible.  Many air  conditioners have filters that trap the pollen spores. Use a HEPA room air filter to remove pollen form the indoor air you breathe. Control of Mold Allergen   Mold and fungi can grow on a variety of surfaces provided certain temperature and moisture conditions exist.  Outdoor molds grow on plants, decaying vegetation and soil.  The major outdoor mold, Alternaria and Cladosporium, are found in very high numbers during hot and dry conditions.  Generally, a late Summer - Fall peak is seen for common outdoor fungal spores.  Rain will temporarily lower outdoor mold spore count, but counts rise rapidly when the rainy period ends.  The most important indoor molds are Aspergillus and Penicillium.  Dark, humid and poorly ventilated basements are ideal sites for mold growth.  The next most common sites of mold growth are the bathroom and the kitchen.  Outdoor (Seasonal) Mold Control  Use air conditioning and keep windows closed Avoid exposure to decaying vegetation. Avoid leaf raking. Avoid grain handling. Consider wearing a face mask if working in moldy areas.    Indoor (Perennial) Mold Control   Maintain humidity below 50%. Clean washable surfaces with 5% bleach solution. Remove sources e.g. contaminated carpets.     Control of Dog or Cat Allergen  Avoidance is the best way to manage a dog or cat allergy. If you have a dog or cat and are allergic to dog or cats, consider removing the dog or cat from the home. If you have a dog or cat but don't want to find it a new home, or if your family wants a pet even though someone in the household is allergic, here are some strategies that may help keep symptoms at bay:  Keep the pet out of  your bedroom and restrict it to only a few rooms. Be advised that keeping the dog or cat in only one room will not limit the allergens to that room. Don't pet, hug or kiss the dog or cat; if you do, wash your hands with soap and water. High-efficiency particulate air  (HEPA) cleaners run continuously in a bedroom or living room can reduce allergen levels over time. Regular use of a high-efficiency vacuum cleaner or a central vacuum can reduce allergen levels. Giving your dog or cat a bath at least once a week can reduce airborne allergen.   Control of Cockroach Allergen  Cockroach allergen has been identified as an important cause of acute attacks of asthma, especially in urban settings.  There are fifty-five species of cockroach that exist in the [place], however only three, the [place], [place] species produce allergen that can affect patients with Asthma.  Allergens can be obtained from fecal particles, egg casings and secretions from cockroaches.    Remove food sources. Reduce access to water. Seal access and entry points. Spray runways with 0.5-1% Diazinon or Chlorpyrifos Blow boric acid power under stoves and refrigerator. Place bait stations (hydramethylnon) at feeding sites.    Control of Dust Mite Allergen    Dust mites play a major role in allergic asthma and rhinitis.  They occur in environments with high humidity wherever human skin is found.  Dust mites absorb humidity from the atmosphere (ie, they do not drink) and feed on organic matter (including shed human and animal skin).  Dust mites are a microscopic type of insect that you cannot see with the naked eye.  High levels of dust mites have been detected from mattresses, pillows, carpets, upholstered furniture, bed covers, clothes, soft toys and any woven material.  The principal allergen of the dust mite is found in its feces.  A gram of dust may contain 1,000 mites and 250,000 fecal particles.  Mite antigen is easily measured in the air during house cleaning activities.  Dust mites do not bite and do not cause harm to humans, other than by triggering allergies/asthma.    Ways to decrease your exposure to dust mites in your home:  Encase mattresses, box springs and  pillows with a mite-impermeable barrier or cover   Wash sheets, blankets and drapes weekly in hot water (130 F) with detergent and dry them in a dryer on the hot setting.  Have the room cleaned frequently with a vacuum cleaner and a damp dust-mop.  For carpeting or rugs, vacuuming with a vacuum cleaner equipped with a high-efficiency particulate air (HEPA) filter.  The dust mite allergic individual should not be in a room which is being cleaned and should wait 1 hour after cleaning before going into the room. Do not sleep on upholstered furniture (eg, couches).   If possible removing carpeting, upholstered furniture and drapery from the home is ideal.  Horizontal blinds should be eliminated in the rooms where the person spends the most time (bedroom, study, television room).  Washable vinyl, roller-type shades are optimal. Remove all non-washable stuffed toys from the bedroom.  Wash stuffed toys weekly like sheets and blankets above.   Reduce indoor humidity to less than 50%.  Inexpensive humidity monitors can be purchased at most hardware stores.  Do not use a humidifier as can make the problem worse and are not recommended.   Allergy Shots   Allergies are the result of a chain reaction that starts in the immune system.  Your immune system controls how your body defends itself. For instance, if you have an allergy to pollen, your immune system identifies pollen as an invader or allergen. Your immune system overreacts by producing antibodies called Immunoglobulin E (IgE). These antibodies travel to cells that release chemicals, causing an allergic reaction.  The concept behind allergy immunotherapy, whether it is received in the form of shots or tablets, is that the immune system can be desensitized to specific allergens that trigger allergy symptoms. Although it requires time and patience, the payback can be long-term relief.  How Do Allergy Shots Work?  Allergy shots work much like a vaccine. Your  body responds to injected amounts of a particular allergen given in increasing doses, eventually developing a resistance and tolerance to it. Allergy shots can lead to decreased, minimal or no allergy symptoms.  There generally are two phases: build-up and maintenance. Build-up often ranges from three to six months and involves receiving injections with increasing amounts of the allergens. The shots are typically given once or twice a week, though more rapid build-up schedules are sometimes used.  The maintenance phase begins when the most effective dose is reached. This dose is different for each person, depending on how allergic you are and your response to the build-up injections. Once the maintenance dose is reached, there are longer periods between injections, typically two to four weeks.  Occasionally doctors give cortisone-type shots that can temporarily reduce allergy symptoms. These types of shots are different and should not be confused with allergy immunotherapy shots.  Who Can Be Treated with Allergy Shots?  Allergy shots may be a good treatment approach for people with allergic rhinitis (hay fever), allergic asthma, conjunctivitis (eye allergy) or stinging insect allergy.   Before deciding to begin allergy shots, you should consider:   The length of allergy season and the severity of your symptoms  Whether medications and/or changes to your environment can control your symptoms  Your desire to avoid long-term medication use  Time: allergy immunotherapy requires a major time commitment  Cost: may vary depending on your insurance coverage  Allergy shots for children age 25 and older are effective and often well tolerated. They might prevent the onset of new allergen sensitivities or the progression to asthma.  Allergy shots are not started on patients who are pregnant but can be continued on patients who become pregnant while receiving them. In some patients with other medical  conditions or who take certain common medications, allergy shots may be of risk. It is important to mention other medications you talk to your allergist.   When Will I Feel Better?  Some may experience decreased allergy symptoms during the build-up phase. For others, it may take as long as 12 months on the maintenance dose. If there is no improvement after a year of maintenance, your allergist will discuss other treatment options with you.  If you aren't responding to allergy shots, it may be because there is not enough dose of the allergen in your vaccine or there are missing allergens that were not identified during your allergy testing. Other reasons could be that there are high levels of the allergen in your environment or major exposure to non-allergic triggers like tobacco smoke.  What Is the Length of Treatment?  Once the maintenance dose is reached, allergy shots are generally continued for three to five years. The decision to stop should be discussed with your allergist at that time. Some people may experience a permanent reduction of allergy  symptoms. Others may relapse and a longer course of allergy shots can be considered.  What Are the Possible Reactions?  The two types of adverse reactions that can occur with allergy shots are local and systemic. Common local reactions include very mild redness and swelling at the injection site, which can happen immediately or several hours after. A systemic reaction, which is less common, affects the entire body or a particular body system. They are usually mild and typically respond quickly to medications. Signs include increased allergy symptoms such as sneezing, a stuffy nose or hives.  Rarely, a serious systemic reaction called anaphylaxis can develop. Symptoms include swelling in the throat, wheezing, a feeling of tightness in the chest, nausea or dizziness. Most serious systemic reactions develop within 30 minutes of allergy shots. This is why  it is strongly recommended you wait in your doctor's office for 30 minutes after your injections. Your allergist is trained to watch for reactions, and his or her staff is trained and equipped with the proper medications to identify and treat them.  Who Should Administer Allergy Shots?  The preferred location for receiving shots is your prescribing allergist's office. Injections can sometimes be given at another facility where the physician and staff are trained to recognize and treat reactions, and have received instructions by your prescribing allergist.

## 2021-12-21 NOTE — Progress Notes (Signed)
NEW PATIENT  Date of Service/Encounter:  12/21/21  Consult requested by: [name], FNP   Assessment:   Mild intermittent asthma, uncomplicated  Chronic urticaria  Seasonal and perennial allergic rhinitis (grasses, ragweed, weeds, trees, indoor molds, outdoor molds, dust mites, cat, dog, cockroach, and tobacco)  Anaphylactic shock due to food  Plan/Recommendations:   1. Chronic urticaria - You did have several items on testing that would explain your urticaria (hives) and itching. - However, we are going to get a bit of blood work to make sure we are not missing something bigger.  - We we will call you in 1 to 2 weeks to the results of the testing. - Chronic hives are often times a self limited process and will "burn themselves out" over 6-12 months, although this is not always the case.  - In the meantime, start suppressive dosing of antihistamines:   - Morning: Xyzal (levocetirizine) '5mg'$  + Pepcid '40mg'$   - Evening: Xyzal (levocetirizine) '5mg'$  + Pepcid '40mg'$  - You can change this dosing at home, decreasing the dose as needed or increasing the dosing as needed.  - If you are not tolerating the medications or are tired of taking them every day, we can start treatment with a monthly injectable medication called Xolair.   2. Seasonal and perennial allergic rhinitis - Testing today showed: grasses, ragweed, weeds, trees, indoor molds, outdoor molds, dust mites, cat, dog, cockroach, and tobacco  - Copy of test results provided.  - Avoidance measures provided. - Start taking: antihistamines as above for the hives - Consider nasal saline rinses 1-2 times daily to remove allergens from the nasal cavities as well as help with mucous clearance (this is especially helpful to do before the nasal sprays are given) - Consider allergy shots as a means of long-term control. - Allergy shots "re-train" and "reset" the immune system to ignore environmental allergens and decrease the resulting  immune response to those allergens (sneezing, itchy watery eyes, runny nose, nasal congestion, etc).    - Allergy shots improve symptoms in 75-85% of patients.  - We can discuss more at the next appointment if the medications are not working for you.  3. Anaphylactic shock due to food (seafood) - Testing was negative to the most common foods. - Because of your reaction to the shellfish, we are going to confirm that with blood work. - EpiPen training reviewed.  - We will call you in 1-2 weeks with the results of the testing.   4. Mild persistent asthma, uncomplicated - Lung testing was in the 60% range, but it did improve albeit slightly with the albuterol treatment. - This points towards a diagnosis of asthma, but does not rule it in definitively. - However, all of your symptoms are consistent with asthma as well as the fact that you have bene diagnosed with asthma in the past.  - We are going to start Breo 159mg one puff once daily (this contains a long acting albuterol combined with an inhaled steroid. - Daily controller medication(s): Breo 100/242m one puff once daily - Prior to physical activity: albuterol 2 puffs 10-15 minutes before physical activity. - Rescue medications: albuterol 4 puffs every 4-6 hours as needed - Asthma control goals:  * Full participation in all desired activities (may need albuterol before activity) * Albuterol use two time or less a week on average (not counting use with activity) * Cough interfering with sleep two time or less a month * Oral steroids no more than once a  year * No hospitalizations  5. Return in about 6 weeks (around 02/01/2022).    This note in its entirety was forwarded to the Provider who requested this consultation.  Subjective:   Jessica Sanchez is a 36 y.o. female presenting today for evaluation of  Chief Complaint  Patient presents with   Allergic Rhinitis     Sneezing, cough,runny nose, itchy throat, itchy ears   Urticaria     Breakout when she goes outside.   Pruritus    Is itchy all the time   Allergy Testing    Shellfish    Angioedema    Legs, feet    Jessica Sanchez has a history of the following: Patient Active Problem List   Diagnosis Date Noted   OSA (obstructive sleep apnea) 11/23/2021   Mucoid otitis media of left ear 10/26/2021   Peripheral edema 10/26/2021   Allergy 10/26/2021   Encounter for IUD removal    Menorrhagia 09/04/2020   Elevated troponin    Attempted IUD removal, unsuccessful 07/05/2019   Encounter for IUD insertion 07/05/2019   Gastroesophageal reflux disease    Nonspecific chest pain 11/11/2018   Morbid obesity with body mass index (BMI) of 60.0 to 69.9 in adult [facility]) 11/11/2018   Elevated d-dimer 11/11/2018   SVT (supraventricular tachycardia) ([place]) 10/04/2018   IUD (intrauterine device) in place 07/11/2015   Other disorder of menstruation and other abnormal bleeding from female genital tract 10/30/2012    History obtained from: chart review and patient.  Jessica Sanchez was referred by [name], [place].     Jessica Sanchez is a 36 y.o. female presenting for an evaluation of allergies and hives .   Asthma/Respiratory Symptom History: She has an albuterol inhaler that she uses as needed. She got this when she went to see her PCP or when she went to the ED for her symptoms.   Allergic Rhinitis Symptom History: She does have sneezing and itchy watery eyes. She does sneeze a lot. She sneezes every day. She has not tried using anything for her symptoms.   Food Allergy Symptom History: She has a seafood allergy. She was working at [employer] 4-5 years ago.  She had a crab cake and everyone had to taste test it. She took a it of it and she had some throat swelling. She is eating seafood now and she is careful because she is not sure if she can tolerate it. She does drink milk without a problem. She does have some GI issues it it. She does eat wheat with some worsening of her GI symptoms. She  does report some itching with bread. She does not eat eggs, although she likes hard boiled eggs. She has noticed some itching of the inside of her mouth  Skin Symptom History: She has been having breakouts for around 3-4 years. She is unsure of the triggers. She had breakouts of welts and she is unsure what triggered it. She is unsure of the lotions that she uses.  Her breakouts remain for a "while". She has treated it with something that makes her itch a lot. This is over the counter.   Otherwise, there is no history of other atopic diseases, including drug allergies, stinging insect allergies, eczema, or contact dermatitis. There is no significant infectious history. Vaccinations are up to date.    Past Medical History: Patient Active Problem List   Diagnosis Date Noted   OSA (obstructive sleep apnea) 11/23/2021   Mucoid otitis media of left ear  10/26/2021   Peripheral edema 10/26/2021   Allergy 10/26/2021   Encounter for IUD removal    Menorrhagia 09/04/2020   Elevated troponin    Attempted IUD removal, unsuccessful 07/05/2019   Encounter for IUD insertion 07/05/2019   Gastroesophageal reflux disease    Nonspecific chest pain 11/11/2018   Morbid obesity with body mass index (BMI) of 60.0 to 69.9 in adult [facility]) 11/11/2018   Elevated d-dimer 11/11/2018   SVT (supraventricular tachycardia) ([place]) 10/04/2018   IUD (intrauterine device) in place 07/11/2015   Other disorder of menstruation and other abnormal bleeding from female genital tract 10/30/2012    Medication List:  Allergies as of 12/21/2021       Reactions   Shellfish Allergy Shortness Of Breath, Itching, Swelling   Mouth became swollen, throat started itching, and developed shortness of breath   Sulfa Antibiotics Itching, Shortness Of Breath, Swelling   Mouth became swollen, throat started itching, and developed shortness of breath   Adhesive [tape] Itching   Depends on the type of medical tape        Medication List         Accurate as of December 21, 2021 10:56 PM. If you have any questions, ask your nurse or doctor.          albuterol 108 (90 Base) MCG/ACT inhaler Commonly known as: VENTOLIN HFA Inhale 2 puffs into the lungs every 6 (six) hours as needed for wheezing or shortness of breath.   cephALEXin 500 MG capsule Commonly known as: KEFLEX Take 1 capsule (500 mg total) by mouth 4 (four) times daily.   famotidine 40 MG tablet Commonly known as: Pepcid Take 1 tablet (40 mg total) by mouth 2 (two) times daily. Started by: Valentina Shaggy, MD   flecainide 100 MG tablet Commonly known as: TAMBOCOR Take 150 mg by mouth 2 (two) times daily.   fluticasone furoate-vilanterol 100-25 MCG/ACT Aepb Commonly known as: BREO ELLIPTA Inhale 1 puff into the lungs daily. Started by: Valentina Shaggy, MD   furosemide 20 MG tablet Commonly known as: LASIX Take 1 tablet (20 mg total) by mouth daily.   hydrochlorothiazide 12.5 MG capsule Commonly known as: MICROZIDE Take 1 capsule (12.5 mg total) by mouth daily.   HYDROcodone-acetaminophen 5-325 MG tablet Commonly known as: NORCO/VICODIN Take 1 tablet by mouth every 6 (six) hours as needed.   ketorolac 10 MG tablet Commonly known as: TORADOL Take 1 tablet (10 mg total) by mouth every 8 (eight) hours as needed.   levocetirizine 5 MG tablet Commonly known as: XYZAL Take 1 tablet (5 mg total) by mouth in the morning and at bedtime. Started by: Valentina Shaggy, MD   ondansetron 8 MG disintegrating tablet Commonly known as: ZOFRAN-ODT Take 1 tablet (8 mg total) by mouth every 8 (eight) hours as needed for nausea or vomiting.   ONE-A-DAY WOMENS PO Take 1 tablet by mouth daily.   potassium chloride SA 20 MEQ tablet Commonly known as: KLOR-CON M Take 1 tablet (20 mEq total) by mouth 2 (two) times daily.   Vitamin D (Ergocalciferol) 1.25 MG (50000 UNIT) Caps capsule Commonly known as: DRISDOL Take 1 capsule (50,000 Units total)  by mouth every 7 (seven) days.        Birth History: non-contributory  Developmental History: non-contributory  Past Surgical History: Past Surgical History:  Procedure Laterality Date   DILATATION AND CURETTAGE/HYSTEROSCOPY WITH MINERVA N/A 12/01/2021   Procedure: DILATATION AND CURETTAGE/HYSTEROSCOPY WITH MINERVA;  Surgeon: [name], MD;  Location: [facility];  Service: Gynecology;  Laterality: N/A;   HYSTEROSCOPY N/A 09/17/2020   Procedure: HYSTEROSCOPY;  Surgeon: [name], MD;  Location: [facility];  Service: Gynecology;  Laterality: N/A;   HYSTEROSCOPY WITH D & C N/A 09/27/2013   Procedure: DILATATION AND CURETTAGE /HYSTEROSCOPY and insertion of Mirena IUD ;  Surgeon: [name]. Harrington Challenger, MD;  Location: [facility];  Service: Gynecology;  Laterality: N/A;   IUD REMOVAL N/A 09/17/2020   Procedure: INTRAUTERINE DEVICE (IUD) REMOVAL (2);  Surgeon: [name], MD;  Location: [facility];  Service: Gynecology;  Laterality: N/A;   MYOMECTOMY  02/03/2011   Procedure: MYOMECTOMY;  Surgeon: [name], MD;  Location: [facility];  Service: Gynecology;  Laterality: N/A;   SVT ABLATION N/A 11/08/2018   Procedure: SVT ABLATION;  Surgeon: [name], MD;  Location: [place] [facility];  Service: Cardiovascular;  Laterality: N/A;   SVT ABLATION N/A 12/26/2020   Procedure: SVT ABLATION;  Surgeon: [name], MD;  Location: [place] [facility];  Service: Cardiovascular;  Laterality: N/A;   TONSILLECTOMY     TYMPANOSTOMY TUBE PLACEMENT       Family History: Family History  Problem Relation Age of Onset   Cirrhosis Mother    Diabetes Mother    Cancer Maternal Grandfather    Hypertension Sister    Diabetes Sister    Anesthesia problems Neg Hx    Hypotension Neg Hx    Malignant hyperthermia Neg Hx    Pseudochol deficiency Neg Hx      Social History: Jessica Sanchez lives at home with her family. They live in a house without mildew or mold damage in the home. They have fans for cooling. There are  no pets in the home. There are dust mite coverings on the bedding including the pillows. There is no tobacco exposure in the home. She currently works at [employer] for the last 3 months. Prior to that, she had a number of other jobs including [employer] and another Surveyor, mining at [employer].     Review of Systems  Constitutional: Negative.  Negative for chills, fever, malaise/fatigue and weight loss.  HENT:  Positive for congestion. Negative for ear discharge, ear pain and sinus pain.        Positive for postnasal drip.  Eyes:  Negative for pain, discharge and redness.  Respiratory:  Negative for cough, sputum production, shortness of breath and wheezing.   Cardiovascular: Negative.  Negative for chest pain and palpitations.  Gastrointestinal:  Negative for abdominal pain, constipation, diarrhea, heartburn, nausea and vomiting.  Skin:  Positive for itching and rash.  Neurological:  Negative for dizziness and headaches.  Endo/Heme/Allergies:  Negative for environmental allergies. Does not bruise/bleed easily.  All other systems reviewed and are negative.      Objective:   Blood pressure 118/68, pulse 95, temperature (!) 97.2 F (36.2 C), resp. rate 16, height '5\' 2"'$  (1.575 m), weight (!) 340 lb (154.2 kg), SpO2 98 %. Body mass index is 62.19 kg/m.     Physical Exam Vitals reviewed.  Constitutional:      Appearance: She is well-developed. She is obese.  HENT:     Head: Normocephalic and atraumatic.     Right Ear: Tympanic membrane, ear canal and external ear normal. No drainage, swelling or tenderness. Tympanic membrane is not injected, scarred, erythematous, retracted or bulging.     Left Ear: Tympanic membrane, ear canal and external ear normal. No drainage, swelling or tenderness. Tympanic  membrane is not injected, scarred, erythematous, retracted or bulging.     Nose: Mucosal edema and rhinorrhea present. No nasal deformity or septal deviation.     Right Turbinates: Enlarged,  swollen and pale.     Left Turbinates: Enlarged, swollen and pale.     Right Sinus: No maxillary sinus tenderness or frontal sinus tenderness.     Left Sinus: No maxillary sinus tenderness or frontal sinus tenderness.     Mouth/Throat:     Mouth: Mucous membranes are not pale and not dry.     Pharynx: Uvula midline.  Eyes:     General:        Right eye: No discharge.        Left eye: No discharge.     Conjunctiva/sclera: Conjunctivae normal.     Right eye: Right conjunctiva is not injected. No chemosis.    Left eye: Left conjunctiva is not injected. No chemosis.    Pupils: Pupils are equal, round, and reactive to light.  Cardiovascular:     Rate and Rhythm: Normal rate and regular rhythm.     Heart sounds: Normal heart sounds.  Pulmonary:     Effort: Pulmonary effort is normal. No tachypnea, accessory muscle usage or respiratory distress.     Breath sounds: Normal breath sounds. No wheezing, rhonchi or rales.  Chest:     Chest wall: No tenderness.  Abdominal:     Tenderness: There is no abdominal tenderness. There is no guarding or rebound.  Lymphadenopathy:     Head:     Right side of head: No submandibular, tonsillar or occipital adenopathy.     Left side of head: No submandibular, tonsillar or occipital adenopathy.     Cervical: No cervical adenopathy.  Skin:    General: Skin is warm.     Capillary Refill: Capillary refill takes less than 2 seconds.     Coloration: Skin is not pale.     Findings: Rash present. No abrasion, erythema, lesion or petechiae. Rash is not papular, urticarial or vesicular.     Comments: Excoriations present especially on the lower back bilaterally. There are no urticaria present at all.   Neurological:     Mental Status: She is alert.  Psychiatric:        Behavior: Behavior is cooperative.      Diagnostic studies:    Spirometry: results abnormal (FEV1: 1.50/60%, FVC: 1.99/67%, FEV1/FVC: 75%).    Spirometry consistent with possible  restrictive disease.   Allergy Studies:     Airborne Adult Perc - 12/21/21 1455     Time Antigen Placed 1455    Allergen Manufacturer Lavella Hammock    Location Back    Number of Test 59    1. Control-Buffer 50% Glycerol Negative    2. Control-Histamine 1 mg/ml 2+    3. Albumin saline Negative    4. [place] 2+    5. [place] 2+    6. [place] Negative    7. [place] Blue 2+    8. Meadow Fescue 2+    9. Perennial Rye 2+    10. Sweet Vernal Negative    11. Timothy 2+    12. Cocklebur 2+    13. Burweed Marshelder 2+    14. Ragweed, short Negative    15. Ragweed, Giant 2+    16. Plantain,  English 2+    17. Lamb's Quarters 2+    18. Sheep Sorrell 2+    19. Rough Pigweed 2+    20. Skeet Simmer  Elder, Rough Negative    21. Mugwort, Common 2+    22. Ash mix Negative    23. Birch mix Negative    24. Beech American 2+    25. Box, Elder 2+    26. Cedar, red 2+    27. Cottonwood, Eastern 2+    28. Elm mix 2+    29. Hickory 2+    30. Maple mix Negative    31. Oak, [place] mix Negative    32. Pecan Pollen 2+    33. Pine mix Negative    34. Sycamore Eastern Negative    35. Basehor, Black Pollen 2+    36. Alternaria alternata Negative    37. Cladosporium Herbarum Negative    38. Aspergillus mix Negative    39. Penicillium mix Negative    40. Bipolaris sorokiniana (Helminthosporium) Negative    41. Drechslera spicifera (Curvularia) 2+    42. Mucor plumbeus Negative    43. Fusarium moniliforme 2+    44. Aureobasidium pullulans (pullulara) 2+    45. Rhizopus oryzae 2+    46. Botrytis cinera 2+    47. Epicoccum nigrum 2+    48. Phoma betae Negative    49. Candida Albicans Negative    50. Trichophyton mentagrophytes 2+    51. Mite, D Farinae  5,000 AU/ml 2+    52. Mite, D Pteronyssinus  5,000 AU/ml Negative    53. Cat Hair 10,000 BAU/ml 2+    54.  Dog Epithelia 2+    55. Mixed Feathers 2+    56. Horse Epithelia 2+    57. Cockroach, German 2+    58. Mouse Negative    59. Tobacco Leaf 2+              Food Perc - 12/21/21 1455       Test Information   Time Antigen Placed 1456    Allergen Manufacturer Lavella Hammock    Location Back    Number of allergen test 10      Food   1. Peanut Negative    2. Soybean food Negative    3. Wheat, whole Negative    4. Sesame Negative    5. Milk, cow Negative    6. Egg White, chicken Negative    7. Casein Negative    8. Shellfish mix Negative    9. Fish mix Negative    10. Cashew Negative             Allergy testing results were read and interpreted by myself, documented by clinical staff.         Salvatore Marvel, MD [facility] [place] [facility] [place]

## 2021-12-27 ENCOUNTER — Emergency Department (HOSPITAL_COMMUNITY)
Admission: EM | Admit: 2021-12-27 | Discharge: 2021-12-27 | Disposition: A | Discharge status: Home / Self Care | Payer: 59 | Attending: Student | Admitting: Student

## 2021-12-27 ENCOUNTER — Other Ambulatory Visit: Payer: Self-pay

## 2021-12-27 ENCOUNTER — Encounter (HOSPITAL_COMMUNITY): Payer: Self-pay | Admitting: Emergency Medicine

## 2021-12-27 DIAGNOSIS — Z5321 Procedure and treatment not carried out due to patient leaving prior to being seen by health care provider: Secondary | ICD-10-CM | POA: Insufficient documentation

## 2021-12-27 DIAGNOSIS — N939 Abnormal uterine and vaginal bleeding, unspecified: Secondary | ICD-10-CM | POA: Insufficient documentation

## 2021-12-27 DIAGNOSIS — I10 Essential (primary) hypertension: Secondary | ICD-10-CM | POA: Insufficient documentation

## 2021-12-27 DIAGNOSIS — R1030 Lower abdominal pain, unspecified: Secondary | ICD-10-CM | POA: Insufficient documentation

## 2021-12-27 LAB — CBC WITH DIFFERENTIAL/PLATELET
Abs Immature Granulocytes: 0.02 10*3/uL (ref 0.00–0.07)
Basophils Absolute: 0 10*3/uL (ref 0.0–0.1)
Basophils Relative: 0 %
Eosinophils Absolute: 0.1 10*3/uL (ref 0.0–0.5)
Eosinophils Relative: 3 %
HCT: 38.4 % (ref 36.0–46.0)
Hemoglobin: 11.9 g/dL — ABNORMAL LOW (ref 12.0–15.0)
Immature Granulocytes: 0 %
Lymphocytes Relative: 39 %
Lymphs Abs: 1.8 10*3/uL (ref 0.7–4.0)
MCH: 26.9 pg (ref 26.0–34.0)
MCHC: 31 g/dL (ref 30.0–36.0)
MCV: 86.9 fL (ref 80.0–100.0)
Monocytes Absolute: 0.5 10*3/uL (ref 0.1–1.0)
Monocytes Relative: 10 %
Neutro Abs: 2.2 10*3/uL (ref 1.7–7.7)
Neutrophils Relative %: 48 %
Platelets: 314 10*3/uL (ref 150–400)
RBC: 4.42 MIL/uL (ref 3.87–5.11)
RDW: 15.9 % — ABNORMAL HIGH (ref 11.5–15.5)
WBC: 4.7 10*3/uL (ref 4.0–10.5)
nRBC: 0 % (ref 0.0–0.2)

## 2021-12-27 LAB — COMPREHENSIVE METABOLIC PANEL
ALT: 17 U/L (ref 0–44)
AST: 15 U/L (ref 15–41)
Albumin: 3.9 g/dL (ref 3.5–5.0)
Alkaline Phosphatase: 61 U/L (ref 38–126)
Anion gap: 8 (ref 5–15)
BUN: 11 mg/dL (ref 6–20)
CO2: 24 mmol/L (ref 22–32)
Calcium: 8.9 mg/dL (ref 8.9–10.3)
Chloride: 105 mmol/L (ref 98–111)
Creatinine, Ser: 0.57 mg/dL (ref 0.44–1.00)
GFR, Estimated: >60 mL/min (ref >60)
Glucose, Bld: 80 mg/dL (ref 70–99)
Potassium: 3.9 mmol/L (ref 3.5–5.1)
Sodium: 137 mmol/L (ref 135–145)
Total Bilirubin: 0.8 mg/dL (ref 0.3–1.2)
Total Protein: 7.4 g/dL (ref 6.5–8.1)

## 2021-12-27 LAB — HCG, QUANTITATIVE, PREGNANCY: hCG, Beta Chain, Quant, S: <1 m[IU]/mL (ref <5)

## 2021-12-27 LAB — LIPASE, BLOOD: Lipase: 23 U/L (ref 11–51)

## 2021-12-27 NOTE — ED Triage Notes (Signed)
Pt to the ED with complaints of Abdominal pain and cramping after hysteroscopy with endometrial ablation with Dr. Elonda Husky at the beginning of August.  Pt is still having a lot of vaginal and soaking a pad every 45 minutes.

## 2021-12-27 NOTE — ED Provider Triage Note (Signed)
Emergency Medicine Provider Triage Evaluation Note  Jessica Sanchez , a 36 y.o. female  was evaluated in triage.   Patient with medical history of hypertension, menorrhagia, uterine fibroids status post 6 endometrial ablation 3 weeks ago presents today due to vaginal bleeding and lower abdominal pain.  States that after the procedure she had some bleeding which resolved a few days after, started having abdominal cramping to the lower abdomen yesterday.  Today she has been having increased vaginal bleeding which is light red with some clots.  No loss of consciousness, not on any anticoagulation.  Review of Systems  Per HPI  Physical Exam  BP 132/82 (BP Location: Right Arm)   Pulse 85   Temp 98.5 F (36.9 C) (Oral)   Resp 20   Ht '5\' 2"'$  (1.575 m)   Wt (!) 154.2 kg   SpO2 98%   BMI 62.19 kg/m  Gen:   Awake, no distress   Resp:  Normal effort  MSK:   Moves extremities without difficulty  Other:  Suprapubic abdominal tenderness on exam  Medical Decision Making  Medically screening exam initiated at 1:56 PM.  Appropriate orders placed.  CHRISTEL BAI was informed that the remainder of the evaluation will be completed by another provider, this initial triage assessment does not replace that evaluation, and the importance of remaining in the ED until their evaluation is complete.     Sherrill Raring, PA-C 12/27/21 1356

## 2021-12-28 ENCOUNTER — Telehealth: Payer: Self-pay

## 2021-12-28 ENCOUNTER — Other Ambulatory Visit: Payer: Self-pay | Admitting: Obstetrics & Gynecology

## 2021-12-28 DIAGNOSIS — N939 Abnormal uterine and vaginal bleeding, unspecified: Secondary | ICD-10-CM

## 2021-12-28 MED ORDER — MEGESTROL ACETATE 40 MG PO TABS: ORAL_TABLET | ORAL | 0 refills | Status: AC

## 2021-12-28 NOTE — Progress Notes (Signed)
Rx for Megace due to AUB s/p ablation

## 2021-12-28 NOTE — Telephone Encounter (Signed)
Patient stated that she had a procedure done on 12/01/2021 and is having heavy bleeding. Would like for a nurse to call her with advise on what to do.

## 2021-12-28 NOTE — Telephone Encounter (Signed)
Prescription for megace taper sent in  Janyth Pupa, DO Attending [place], [facility] for [facility], [place]

## 2021-12-28 NOTE — Telephone Encounter (Signed)
Spoke with patient about her bleeding. She started having cramps sat early am and started bleeding heavy. She is having to change pads frequently. I spoke with Dr. Nelda Marseille about the patient and she stated that she will send in megace and have patient seen Dr. Elonda Husky when he is back in the office. Patient scheduled for next Tuesday at 830.

## 2022-01-05 ENCOUNTER — Ambulatory Visit: Payer: 59 | Admitting: Obstetrics & Gynecology

## 2022-01-12 ENCOUNTER — Other Ambulatory Visit: Payer: Self-pay | Admitting: *Deleted

## 2022-01-12 DIAGNOSIS — M7989 Other specified soft tissue disorders: Secondary | ICD-10-CM

## 2022-01-13 ENCOUNTER — Other Ambulatory Visit: Payer: Self-pay

## 2022-01-13 ENCOUNTER — Emergency Department (HOSPITAL_COMMUNITY): Payer: 59

## 2022-01-13 ENCOUNTER — Encounter (HOSPITAL_COMMUNITY): Payer: Self-pay | Admitting: Radiology

## 2022-01-13 ENCOUNTER — Emergency Department (HOSPITAL_COMMUNITY)
Admission: EM | Admit: 2022-01-13 | Discharge: 2022-01-13 | Disposition: A | Discharge status: Home / Self Care | Payer: 59 | Attending: Emergency Medicine | Admitting: Emergency Medicine

## 2022-01-13 DIAGNOSIS — I10 Essential (primary) hypertension: Secondary | ICD-10-CM | POA: Diagnosis not present

## 2022-01-13 DIAGNOSIS — R0600 Dyspnea, unspecified: Secondary | ICD-10-CM | POA: Diagnosis not present

## 2022-01-13 DIAGNOSIS — R079 Chest pain, unspecified: Secondary | ICD-10-CM | POA: Diagnosis not present

## 2022-01-13 DIAGNOSIS — Z7901 Long term (current) use of anticoagulants: Secondary | ICD-10-CM | POA: Insufficient documentation

## 2022-01-13 DIAGNOSIS — R0602 Shortness of breath: Secondary | ICD-10-CM | POA: Diagnosis not present

## 2022-01-13 DIAGNOSIS — R002 Palpitations: Secondary | ICD-10-CM | POA: Diagnosis not present

## 2022-01-13 DIAGNOSIS — R0789 Other chest pain: Secondary | ICD-10-CM | POA: Diagnosis not present

## 2022-01-13 DIAGNOSIS — R600 Localized edema: Secondary | ICD-10-CM | POA: Diagnosis not present

## 2022-01-13 LAB — I-STAT CHEM 8, ED
BUN: 11 mg/dL (ref 6–20)
Calcium, Ion: 1.18 mmol/L (ref 1.15–1.40)
Chloride: 100 mmol/L (ref 98–111)
Creatinine, Ser: 0.5 mg/dL (ref 0.44–1.00)
Glucose, Bld: 91 mg/dL (ref 70–99)
HCT: 37 % (ref 36.0–46.0)
Hemoglobin: 12.6 g/dL (ref 12.0–15.0)
Potassium: 4 mmol/L (ref 3.5–5.1)
Sodium: 136 mmol/L (ref 135–145)
TCO2: 24 mmol/L (ref 22–32)

## 2022-01-13 LAB — CBC
HCT: 39.5 % (ref 36.0–46.0)
Hemoglobin: 12.3 g/dL (ref 12.0–15.0)
MCH: 26.8 pg (ref 26.0–34.0)
MCHC: 31.1 g/dL (ref 30.0–36.0)
MCV: 86.1 fL (ref 80.0–100.0)
Platelets: 240 10*3/uL (ref 150–400)
RBC: 4.59 MIL/uL (ref 3.87–5.11)
RDW: 15.1 % (ref 11.5–15.5)
WBC: 5.7 10*3/uL (ref 4.0–10.5)
nRBC: 0 % (ref 0.0–0.2)

## 2022-01-13 LAB — BASIC METABOLIC PANEL
Anion gap: 8 (ref 5–15)
BUN: 13 mg/dL (ref 6–20)
CO2: 26 mmol/L (ref 22–32)
Calcium: 9.1 mg/dL (ref 8.9–10.3)
Chloride: 101 mmol/L (ref 98–111)
Creatinine, Ser: 0.67 mg/dL (ref 0.44–1.00)
GFR, Estimated: >60 mL/min (ref >60)
Glucose, Bld: 90 mg/dL (ref 70–99)
Potassium: 4 mmol/L (ref 3.5–5.1)
Sodium: 135 mmol/L (ref 135–145)

## 2022-01-13 LAB — TROPONIN I (HIGH SENSITIVITY)
Troponin I (High Sensitivity): <2 ng/L (ref <18)
Troponin I (High Sensitivity): 2 ng/L (ref <18)

## 2022-01-13 LAB — I-STAT BETA HCG BLOOD, ED (MC, WL, AP ONLY): I-stat hCG, quantitative: <5 m[IU]/mL (ref <5)

## 2022-01-13 LAB — MAGNESIUM: Magnesium: 2.1 mg/dL (ref 1.7–2.4)

## 2022-01-13 MED ORDER — IOHEXOL 350 MG/ML SOLN
100.0000 mL | Freq: Once | INTRAVENOUS | Status: AC | PRN
  Administered 2022-01-13: 100 mL via INTRAVENOUS

## 2022-01-13 MED ORDER — MORPHINE SULFATE (PF) 2 MG/ML IV SOLN
2.0000 mg | Freq: Once | INTRAVENOUS | Status: AC
  Administered 2022-01-13: 2 mg via INTRAVENOUS
  Filled 2022-01-13: qty 1

## 2022-01-13 NOTE — ED Notes (Addendum)
Lab at bedside and RN attempting an [place] IV ISTAT Beta hCG is pending

## 2022-01-13 NOTE — ED Notes (Signed)
Went over [employer]. All questions answered. Pt called for ride and is getting dressed.

## 2022-01-13 NOTE — Discharge Instructions (Signed)
Continue taking your home medications as prescribed.  You should receive a call from the cardiology office to set up a close follow-up appointment.  If you do not hear from them in the next 2 days, call the number below.  Return to the emergency department for any new or worsening symptoms of concern.

## 2022-01-13 NOTE — ED Notes (Signed)
Pt ambulated to the bathroom unassisted.  

## 2022-01-13 NOTE — ED Notes (Signed)
ED Provider at bedside. 

## 2022-01-13 NOTE — ED Provider Notes (Signed)
[facility] EMERGENCY DEPARTMENT Provider Note   CSN: [id number] Arrival date & time: 01/13/22  1358     History  Chief Complaint  Patient presents with   Chest Pain    Jessica Sanchez is a 36 y.o. female.   Chest Pain Associated symptoms: palpitations and shortness of breath   Patient presents for chest pain.  Medical history includes obesity, SVT, GERD, OSA, HTN, arthritis.  She is followed by [facility].  She was last seen last year.  She establish care due to history of SVT.  She has undergone ablations for treatment of SVT.  She reports an episode of SVT several months ago.  Today, around midday, she was at work.  Patient works as a Scientist, water quality at [employer].  She experienced palpitations and lightheadedness.  Symptoms were consistent with prior episodes of SVT.  She was able to check her heart rate on her phone and stated that it was 145 bpm.  Episode lasted for few minutes.  Following this, patient experienced a right-sided chest pain which has been persistent since onset.  She also had shortness of breath which has resolved.  She has not had any further episodes of palpitations.  Currently, chest pain is 9/10 in severity.  It is located in her lower right anterior chest.     Home Medications Prior to Admission medications   Medication Sig Start Date End Date Taking? Authorizing Provider  albuterol (VENTOLIN HFA) 108 (90 Base) MCG/ACT inhaler Inhale 2 puffs into the lungs every 6 (six) hours as needed for wheezing or shortness of breath. 10/26/21  Yes [name], FNP  flecainide (TAMBOCOR) 100 MG tablet Take 150 mg by mouth 2 (two) times daily.   Yes [provider]  fluticasone furoate-vilanterol (BREO ELLIPTA) 100-25 MCG/ACT AEPB Inhale 1 puff into the lungs daily. 12/21/21 01/20/22 Yes Valentina Shaggy, MD  furosemide (LASIX) 20 MG tablet Take 1 tablet (20 mg total) by mouth daily. 11/30/21  Yes [name], FNP  HYDROcodone-acetaminophen (NORCO/VICODIN)  5-325 MG tablet Take 1 tablet by mouth every 6 (six) hours as needed. 12/01/21  Yes [name], MD  levocetirizine (XYZAL) 5 MG tablet Take 1 tablet (5 mg total) by mouth in the morning and at bedtime. 12/21/21 01/20/22 Yes Valentina Shaggy, MD  megestrol (MEGACE) 40 MG tablet Take 3 tablets (120 mg total) by mouth daily for 5 days, THEN 2 tablets (80 mg total) daily for 5 days, THEN 1 tablet (40 mg total) daily for 20 days. 12/28/21 01/27/22 Yes Janyth Pupa, DO  Multiple Vitamins-Calcium (ONE-A-DAY WOMENS PO) Take 1 tablet by mouth daily.   Yes [provider]  potassium chloride SA (KLOR-CON M) 20 MEQ tablet Take 1 tablet (20 mEq total) by mouth 2 (two) times daily. 11/18/21  Yes Fayrene Helper, MD  Vitamin D, Ergocalciferol, (DRISDOL) 1.25 MG (50000 UNIT) CAPS capsule Take 1 capsule (50,000 Units total) by mouth every 7 (seven) days. 10/28/21  Yes [name], FNP  famotidine (PEPCID) 40 MG tablet Take 1 tablet (40 mg total) by mouth 2 (two) times daily. Patient not taking: Reported on 01/13/2022 12/21/21 01/20/22  Valentina Shaggy, MD  hydrochlorothiazide (MICROZIDE) 12.5 MG capsule Take 1 capsule (12.5 mg total) by mouth daily. Patient not taking: Reported on 11/30/2021 10/26/21   [name], FNP  ketorolac (TORADOL) 10 MG tablet Take 1 tablet (10 mg total) by mouth every 8 (eight) hours as needed. Patient not taking: Reported on 12/21/2021 12/01/21  [name], MD  ondansetron (ZOFRAN-ODT) 8 MG disintegrating tablet Take 1 tablet (8 mg total) by mouth every 8 (eight) hours as needed for nausea or vomiting. Patient not taking: Reported on 12/15/2021 12/01/21   [name], MD  apixaban (ELIQUIS) 5 MG TABS tablet Take 2 tablets ('10mg'$ ) twice daily for 7 days, then 1 tablet ('5mg'$ ) twice daily 04/20/19 04/20/19  Rodell Perna A, PA-C  levonorgestrel (MIRENA) 20 MCG/24HR IUD 1 each by Intrauterine route once.   06/25/20  [provider]      Allergies    Shellfish  allergy, Sulfa antibiotics, and Adhesive [tape]    Review of Systems   Review of Systems  Respiratory:  Positive for shortness of breath.   Cardiovascular:  Positive for chest pain and palpitations.  Neurological:  Positive for light-headedness.  All other systems reviewed and are negative.   Physical Exam Updated Vital Signs BP 115/62   Pulse 77   Temp 97.6 F (36.4 C) (Oral)   Resp 20   Ht '5\' 2"'$  (1.575 m)   Wt (!) 145.2 kg   SpO2 99%   BMI 58.53 kg/m  Physical Exam Vitals and nursing note reviewed.  Constitutional:      General: She is not in acute distress.    Appearance: She is well-developed. She is obese. She is not ill-appearing, toxic-appearing or diaphoretic.  HENT:     Head: Normocephalic and atraumatic.  Eyes:     Extraocular Movements: Extraocular movements intact.     Conjunctiva/sclera: Conjunctivae normal.  Cardiovascular:     Rate and Rhythm: Normal rate and regular rhythm.     Heart sounds: No murmur heard.    No friction rub. No gallop.  Pulmonary:     Effort: Pulmonary effort is normal. No tachypnea or respiratory distress.     Breath sounds: Normal breath sounds. No decreased breath sounds, wheezing, rhonchi or rales.  Chest:     Chest wall: Tenderness present.  Abdominal:     Palpations: Abdomen is soft.     Tenderness: There is no abdominal tenderness.  Musculoskeletal:        General: No swelling.     Cervical back: Normal range of motion and neck supple.     Right lower leg: Edema present.     Left lower leg: Edema present.  Skin:    General: Skin is warm and dry.     Capillary Refill: Capillary refill takes less than 2 seconds.  Neurological:     General: No focal deficit present.     Mental Status: She is alert and oriented to person, place, and time.  Psychiatric:        Mood and Affect: Mood normal.        Behavior: Behavior normal.     ED Results / Procedures / Treatments   Labs (all labs ordered are listed, but only abnormal  results are displayed) Labs Reviewed  BASIC METABOLIC PANEL  CBC  MAGNESIUM  I-STAT BETA HCG BLOOD, ED ([facility], [facility], [facility] ONLY)  I-STAT CHEM 8, ED  TROPONIN I (HIGH SENSITIVITY)  TROPONIN I (HIGH SENSITIVITY)    EKG EKG Interpretation  Date/Time:  Wednesday January 13 2022 14:26:44 EDT Ventricular Rate:  85 PR Interval:  142 QRS Duration: 80 QT Interval:  374 QTC Calculation: 445 R Axis:   -5 Text Interpretation: Normal sinus rhythm  Confirmed by Godfrey Pick [id number]) on 01/13/2022 6:36:29 PM  Radiology CT Angio Chest PE W and/or Wo Contrast  Result Date: 01/13/2022 CLINICAL DATA:  Chest pain and dyspnea EXAM: CT ANGIOGRAPHY CHEST WITH CONTRAST TECHNIQUE: Multidetector CT imaging of the chest was performed using the standard protocol during bolus administration of intravenous contrast. Multiplanar CT image reconstructions and MIPs were obtained to evaluate the vascular anatomy. RADIATION DOSE REDUCTION: This exam was performed according to the departmental dose-optimization program which includes automated exposure control, adjustment of the mA and/or kV according to patient size and/or use of iterative reconstruction technique. CONTRAST:  117m OMNIPAQUE IOHEXOL 350 MG/ML SOLN COMPARISON:  Same day radiographs and CT chest angiogram 06/26/2020 FINDINGS: Cardiovascular: Exam is mildly compromised by photon starvation and cardiac/respiratory motion. No definite pulmonary embolism. Longitudinal filling defect along the left lateral wall of the main pulmonary artery is favored due to motion given appearance on coronal images. Questionable filling defects within the distal right main pulmonary artery on axial view also appear secondary to motion/streak artifact from adjacent contrast bolus on coronal view. Cardiomegaly. Mediastinum/Nodes: No enlarged mediastinal, hilar, or axillary lymph nodes. Thyroid gland, trachea, and esophagus demonstrate no significant findings. Lungs/Pleura: Focal pleural  thickening between the left seventh and eighth ribs laterally with adjacent hazy airspace opacity (circa series 6/image 77-88). Lungs are otherwise clear. No pleural effusion or pneumothorax. Upper Abdomen: No acute abnormality. Musculoskeletal: No acute osseous findings. Review of the MIP images confirms the above findings. IMPRESSION: 1. Negative for acute pulmonary embolism given limitations noted above. 2. Focal pleural thickening between the left seventh and eighth ribs laterally with adjacent hazy airspace opacity. This is indeterminate and differential considerations include traumatic injury/contusion, chronic scarring/atelectasis, or pneumonia. Follow-up in 3 months is recommended to ensure resolution. 3. Cardiomegaly. Electronically Signed   By: TPlacido SouM.D.   On: 01/13/2022 20:34   DG Chest 2 View  Result Date: 01/13/2022 CLINICAL DATA:  Right-sided chest pain beginning today. Shortness of breath. EXAM: CHEST - 2 VIEW COMPARISON:  11/16/2021 FINDINGS: Heart size is normal. Mediastinal shadows are normal. Lungs are clear. No infiltrate, collapse or effusion. IMPRESSION: No active cardiopulmonary disease. Electronically Signed   By: MNelson ChimesM.D.   On: 01/13/2022 14:47    Procedures Procedures    Medications Ordered in ED Medications  morphine (PF) 2 MG/ML injection 2 mg (2 mg Intravenous Given 01/13/22 1951)  iohexol (OMNIPAQUE) 350 MG/ML injection 100 mL (100 mLs Intravenous Contrast Given 01/13/22 2016)    ED Course/ Medical Decision Making/ A&P                           Medical Decision Making Amount and/or Complexity of Data Reviewed Labs: ordered. Radiology: ordered.  Risk Prescription drug management.   This patient presents to the ED for concern of palpitations and chest pain, this involves an extensive number of treatment options, and is a complaint that carries with it a high risk of complications and morbidity.  The differential diagnosis includes episode of  SVT, ACS, PE, pericarditis, myocarditis, costochondritis, GERD, anxiety   Co morbidities that complicate the patient evaluation  obesity, SVT, GERD, OSA, HTN, arthritis   Additional history obtained:  Additional history obtained from N/A External records from outside source obtained and reviewed including EMR   Lab Tests:  I Ordered, and personally interpreted labs.  The pertinent results include: Normal hemoglobin, no leukocytosis, normal kidney function, normal electrolytes, normal troponins x2   Imaging Studies ordered:  I ordered imaging studies including chest x-ray, CTA chest I independently visualized and interpreted imaging  which showed a left-sided focal pleural thickening.  In the absence of any left-sided chest symptoms, I suspect this is atelectasis or chronic scarring.  There were no other acute findings. I agree with the radiologist interpretation   Cardiac Monitoring: / EKG:  The patient was maintained on a cardiac monitor.  I personally viewed and interpreted the cardiac monitored which showed an underlying rhythm of: Sinus rhythm  Problem List / ED Course / Critical interventions / Medication management  Patient with history of SVT presents following a episode of palpitations and rapid heart rate followed by right-sided chest pain and shortness of breath.  Shortness of breath has resolved.  Patient continues to endorse chest pain.  EKG shows no significant changes from prior tracings.  Prior to being bedded in the ED, diagnostic work-up was initiated.  Patient's initial troponin was normal.  Electrolytes are normal and chest x-ray is without acute findings.  On assessment, patient is well-appearing.  Her breathing is unlabored and her lungs are clear to auscultation.  No cardiac murmurs or rubs are appreciated on cardiac auscultation.  She does have some mild chest tenderness in the right anterior lower aspect of her chest.  Currently she endorses 9/10 severity chest  pain.  She does endorse worsened pain with deep inspiration.  Morphine was ordered for analgesia.  Patient to undergo repeat troponin and CTA chest.  CTA chest showed a focal pleural thickening on the left side with adjacent hazy airspace opacity.  Given that the patient has not had any left-sided chest symptoms, I suspect that this is atelectasis or chronic scarring.  On reassessment, patient reported improved symptoms.  She is prescribed flecainide and states that she does continue to take this.  Given her history of SVT and episodes of palpitations and lightheadedness today, patient would benefit from cardiology follow-up.  Cardiology referral was ordered.  Patient was discharged in good condition. I ordered medication including morphine for analgesia Reevaluation of the patient after these medicines showed that the patient improved I have reviewed the patients home medicines and have made adjustments as needed   Social Determinants of Health:  Has access to outpatient care        Final Clinical Impression(s) / ED Diagnoses Final diagnoses:  Palpitations  Chest pain, unspecified type    Rx / DC Orders ED Discharge Orders          Ordered    Ambulatory referral to Cardiology       Comments: If you have not heard from the Cardiology office within the next 72 hours please call [phone].   01/13/22 [id number]              Godfrey Pick, MD 01/14/22 0045

## 2022-01-13 NOTE — ED Triage Notes (Signed)
Pt with right sided CP, started while at work.  Pt states with SOB.

## 2022-01-13 NOTE — ED Notes (Signed)
This RN attempted to start an IV on pt unsuccessfully. Another RN to try

## 2022-01-20 ENCOUNTER — Encounter: Payer: Self-pay | Admitting: Pulmonary Disease

## 2022-01-20 ENCOUNTER — Ambulatory Visit (INDEPENDENT_AMBULATORY_CARE_PROVIDER_SITE_OTHER): Payer: 59 | Admitting: Pulmonary Disease

## 2022-01-20 VITALS — BP 132/78 | HR 76 | Temp 97.6°F | Ht 62.0 in | Wt 337.8 lb

## 2022-01-20 DIAGNOSIS — G4733 Obstructive sleep apnea (adult) (pediatric): Secondary | ICD-10-CM

## 2022-01-20 NOTE — Patient Instructions (Signed)
  X Rx for auto CPAP 8-15 cm with medium airfit F20 full face mask to DME

## 2022-01-20 NOTE — Assessment & Plan Note (Signed)
Severe OSA, we reviewed sleep study. This was corrected by CPAP of 11 cm.  She has gained about 40 pounds and may need higher pressure.  Unclear reason for poor tolerance whether this was a machine/pressure issue or a mask issue. We will provide her with a medium AirFit F20 fullface mask this time and set to auto settings 8 to 15 cm just in case she needs higher pressure.  We will tweak settings once we review download. Hopefully with her new insurance she should be able to obtain the machine   I do not think repeat study is necessary here. The pathophysiology of obstructive sleep apnea , it's cardiovascular consequences & modes of treatment including CPAP were discused with the patient in detail & they evidenced understanding.  Weight loss encouraged, compliance with goal of at least 4-6 hrs every night is the expectation. Advised against medications with sedative side effects Cautioned against driving when sleepy - understanding that sleepiness will vary on a day to day basis

## 2022-01-20 NOTE — Progress Notes (Signed)
Subjective:    Patient ID: Jessica Sanchez, female    DOB: June 25, 1985, 36 y.o.   MRN: [id number]  HPI  36 year old woman resents to establish care for management of OSA. She sees Dr. 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  She reports loud snoring noted by her family members and witnessed apneas.  She reports excessive daytime sleepiness and is falling asleep whenever she sits still.  Epworth sleepiness score is 10 She works different shifts and sleep x2 or based on her work hours. Bedtime could be about 10 PM, sleep latency is minimal, she sleeps on her right side with 1 pillow, reports 1-2 nocturnal awakenings and if left herself can sleep up to 9 AM wakes up tired with dryness of mouth and headaches. She has gained 40 pounds to her current weight of 337. There is no history suggestive of cataplexy, sleep paralysis or parasomnias  We reviewed sleep study from 2020.  She could not afford a CPap machine then,.  She used her dad's machine with a fullface mask that was provided and she feels that the mask was pressing on her face.  She is not sure whether the pressure felt too high or not.  Currently this machine is not working at all she tried using it again 2 weeks ago and was unable   PMH -borderline diabetes, SVT  Significant tests/ events reviewed  01/2019 split study -299 pounds -severe OSA, AHI 73/hour, lowest desaturation 75% , corrected by CPAP 11 cm with a small F20 fullface mask  12/2021 spirometry shows moderate restriction with ratio 75, FEV1 60%, FVC 67%   Past Medical History:  Diagnosis Date   Angio-edema    Arthritis    right shoulder   Dyspnea    Dysrhythmia    hx of SVT   Fibroid (bleeding) (uterine)    Fibroid, uterine    H/O metrorrhagia    Hypertension    IUD (intrauterine device) in place 07/11/2015   Menorrhagia    Obesity, Class III, BMI 40-49.9 (morbid obesity) ([place]) 11/11/2018   Sleep  apnea    uses CPAP - setting 11   SVT (supraventricular tachycardia) ([place])    Past Surgical History:  Procedure Laterality Date   DILATATION AND CURETTAGE/HYSTEROSCOPY WITH MINERVA N/A 12/01/2021   Procedure: DILATATION AND CURETTAGE/HYSTEROSCOPY WITH MINERVA;  Surgeon: [name], MD;  Location: [facility];  Service: Gynecology;  Laterality: N/A;   HYSTEROSCOPY N/A 09/17/2020   Procedure: HYSTEROSCOPY;  Surgeon: [name], MD;  Location: [facility];  Service: Gynecology;  Laterality: N/A;   HYSTEROSCOPY WITH D & C N/A 09/27/2013   Procedure: DILATATION AND CURETTAGE /HYSTEROSCOPY and insertion of Mirena IUD ;  Surgeon: [name]. Harrington Challenger, MD;  Location: [place] [facility];  Service: Gynecology;  Laterality: N/A;   IUD REMOVAL N/A 09/17/2020   Procedure: INTRAUTERINE DEVICE (IUD) REMOVAL (2);  Surgeon: [name], MD;  Location: [facility];  Service: Gynecology;  Laterality: N/A;   MYOMECTOMY  02/03/2011   Procedure: MYOMECTOMY;  Surgeon: [name], MD;  Location: [facility];  Service: Gynecology;  Laterality: N/A;   SVT ABLATION N/A 11/08/2018   Procedure: SVT ABLATION;  Surgeon: [name], MD;  Location: [place] [facility];  Service: Cardiovascular;  Laterality: N/A;   SVT ABLATION N/A 12/26/2020   Procedure: SVT ABLATION;  Surgeon: [name], MD;  Location: [place] [facility];  Service: Cardiovascular;  Laterality: N/A;  TONSILLECTOMY     TYMPANOSTOMY TUBE PLACEMENT      Allergies  Allergen Reactions   Shellfish Allergy Shortness Of Breath, Itching and Swelling    Mouth became swollen, throat started itching, and developed shortness of breath   Sulfa Antibiotics Itching, Shortness Of Breath and Swelling    Mouth became swollen, throat started itching, and developed shortness of breath   Adhesive [Tape] Itching    Depends on the type of medical tape    Social History   Socioeconomic History   Marital status: Single    Spouse name: Not on file   Number of children: Not on file    Years of education: Not on file   Highest education level: Not on file  Occupational History   Not on file  Tobacco Use   Smoking status: Never   Smokeless tobacco: Never   Tobacco comments:    socially  Vaping Use   Vaping Use: Never used  Substance and Sexual Activity   Alcohol use: Yes    Comment: occasional   Drug use: Yes    Types: Marijuana    Comment: on occ   Sexual activity: Not Currently    Birth control/protection: None, Condom  Other Topics Concern   Not on file  Social History Narrative   Not on file   Social Determinants of Health   Financial Resource Strain: Not on file  Food Insecurity: Not on file  Transportation Needs: Not on file  Physical Activity: Not on file  Stress: Not on file  Social Connections: Not on file  Intimate Partner Violence: Not on file    Family History  Problem Relation Age of Onset   Cirrhosis Mother    Diabetes Mother    Cancer Maternal Grandfather    Hypertension Sister    Diabetes Sister    Anesthesia problems Neg Hx    Hypotension Neg Hx    Malignant hyperthermia Neg Hx    Pseudochol deficiency Neg Hx      Review of Systems Headaches Hypersomnolence Feet swelling  Constitutional: negative for anorexia, fevers and sweats  Eyes: negative for irritation, redness and visual disturbance  Ears, nose, mouth, throat, and face: negative for earaches, epistaxis, nasal congestion and sore throat  Respiratory: negative for cough, dyspnea on exertion, sputum and wheezing  Cardiovascular: negative for chest pain, dyspnea,orthopnea, palpitations and syncope  Gastrointestinal: negative for abdominal pain, constipation, diarrhea, melena, nausea and vomiting  Genitourinary:negative for dysuria, frequency and hematuria  Hematologic/lymphatic: negative for bleeding, easy bruising and lymphadenopathy  Musculoskeletal:negative for arthralgias, muscle weakness and stiff joints  Neurological: negative for coordination problems, gait  problems, headaches and weakness  Endocrine: negative for diabetic symptoms including polydipsia, polyuria and weight loss     Objective:   Physical Exam  Gen. Pleasant, obese, in no distress, normal affect ENT - no pallor,icterus, no post nasal drip, class 3 airway Neck: No JVD, no thyromegaly, no carotid bruits Lungs: no use of accessory muscles, no dullness to percussion, decreased without rales or rhonchi  Cardiovascular: Rhythm regular, heart sounds  normal, no murmurs or gallops, 1+ peripheral edema Abdomen: soft and non-tender, no hepatosplenomegaly, BS normal. Musculoskeletal: No deformities, no cyanosis or clubbing Neuro:  alert, non focal, no tremors       Assessment & Plan:    Moderate persistent asthma -spirometry shows moderate restriction.  Reviewed allergist note not convinced this is true asthma worse as obesity related continue Breo for now and albuterol for rescue

## 2022-01-22 NOTE — Progress Notes (Unsigned)
Requested by:  [name], [address] [place],  [place] [address]  Reason for consultation: swelling    History of Present Illness   Jessica Sanchez is a 36 y.o. (March 04, 1986) female who presents for evaluation of bilateral lower extremity swelling, left greater than right. She also complains of heaviness, aching, throbbing, and cramps. She says that this has been going on for about 2-3 years. She feels that her symptoms are getting worse. Her symptoms are worse with prolonged standing/ sitting and ambulation. She tries to elevate daily with several pillows. This makes her legs feel better but does not change the swelling. She has tried compression in the past but feels that she cannot tolerate them. She says she gets bruises on her legs and feels that it "cuts my circulation off". She explains that she works as a Scientist, water quality at [employer] and so does a lot of prolonged standing or sitting. She says she tries to walk for exercise occasionally. She has questionable history of DVT and venous disease in her mother and sister, no personal history.   Venous symptoms include: aching, heavy, throbbing, swelling Onset/duration:  Occupation: Psychiatric nurse Aggravating factors: sitting, standing,  Alleviating factors: elevation Compression:  yes Helps:  no, does not tolerate Pain medications:  no Previous vein procedures:  no History of DVT:  no  Past Medical History:  Diagnosis Date   Angio-edema    Arthritis    right shoulder   Dyspnea    Dysrhythmia    hx of SVT   Fibroid (bleeding) (uterine)    Fibroid, uterine    H/O metrorrhagia    Hypertension    IUD (intrauterine device) in place 07/11/2015   Menorrhagia    Obesity, Class III, BMI 40-49.9 (morbid obesity) ([place]) 11/11/2018   Sleep apnea    uses CPAP - setting 11   SVT (supraventricular tachycardia) ([place])     Past Surgical History:  Procedure Laterality Date   DILATATION AND CURETTAGE/HYSTEROSCOPY WITH MINERVA N/A  12/01/2021   Procedure: DILATATION AND CURETTAGE/HYSTEROSCOPY WITH MINERVA;  Surgeon: [name], MD;  [facility]: [facility];  Service: Gynecology;  Laterality: N/A;   HYSTEROSCOPY N/A 09/17/2020   Procedure: HYSTEROSCOPY;  Surgeon: [name], MD;  Location: [facility];  Service: Gynecology;  Laterality: N/A;   HYSTEROSCOPY WITH D & C N/A 09/27/2013   Procedure: DILATATION AND CURETTAGE /HYSTEROSCOPY and insertion of Mirena IUD ;  Surgeon: [name]. Harrington Challenger, MD;  Location: [facility];  Service: Gynecology;  Laterality: N/A;   IUD REMOVAL N/A 09/17/2020   Procedure: INTRAUTERINE DEVICE (IUD) REMOVAL (2);  Surgeon: [name], MD;  Location: [facility];  Service: Gynecology;  Laterality: N/A;   MYOMECTOMY  02/03/2011   Procedure: MYOMECTOMY;  Surgeon: [name], MD;  Location: [facility];  Service: Gynecology;  Laterality: N/A;   SVT ABLATION N/A 11/08/2018   Procedure: SVT ABLATION;  Surgeon: [name], MD;  Location: [facility];  Service: Cardiovascular;  Laterality: N/A;   SVT ABLATION N/A 12/26/2020   Procedure: SVT ABLATION;  Surgeon: [name], MD;  Location: [facility];  Service: Cardiovascular;  Laterality: N/A;   TONSILLECTOMY     TYMPANOSTOMY TUBE PLACEMENT      Social History   Socioeconomic History   Marital status: Single    Spouse name: Not on file   Number of children: Not on file   Years of education: Not on file   Highest  education level: Not on file  Occupational History   Not on file  Tobacco Use   Smoking status: Never   Smokeless tobacco: Never   Tobacco comments:    socially  Vaping Use   Vaping Use: Never used  Substance and Sexual Activity   Alcohol use: Yes    Comment: occasional   Drug use: Yes    Types: Marijuana    Comment: on occ   Sexual activity: Not Currently    Birth control/protection: None, Condom  Other Topics Concern   Not on file  Social History Narrative   Not on file   Social Determinants of Health    Financial Resource Strain: Not on file  Food Insecurity: Not on file  Transportation Needs: Not on file  Physical Activity: Not on file  Stress: Not on file  Social Connections: Not on file  Intimate Partner Violence: Not on file    Family History  Problem Relation Age of Onset   Cirrhosis Mother    Diabetes Mother    Cancer Maternal Grandfather    Hypertension Sister    Diabetes Sister    Anesthesia problems Neg Hx    Hypotension Neg Hx    Malignant hyperthermia Neg Hx    Pseudochol deficiency Neg Hx     Current Outpatient Medications  Medication Sig Dispense Refill   albuterol (VENTOLIN HFA) 108 (90 Base) MCG/ACT inhaler Inhale 2 puffs into the lungs every 6 (six) hours as needed for wheezing or shortness of breath. 8 g 0   flecainide (TAMBOCOR) 100 MG tablet Take 150 mg by mouth 2 (two) times daily.     furosemide (LASIX) 20 MG tablet Take 1 tablet (20 mg total) by mouth daily. 30 tablet 0   hydrochlorothiazide (MICROZIDE) 12.5 MG capsule Take 1 capsule (12.5 mg total) by mouth daily. 30 capsule 0   megestrol (MEGACE) 40 MG tablet Take 3 tablets (120 mg total) by mouth daily for 5 days, THEN 2 tablets (80 mg total) daily for 5 days, THEN 1 tablet (40 mg total) daily for 20 days. 45 tablet 0   Multiple Vitamins-Calcium (ONE-A-DAY WOMENS PO) Take 1 tablet by mouth daily.     Vitamin D, Ergocalciferol, (DRISDOL) 1.25 MG (50000 UNIT) CAPS capsule Take 1 capsule (50,000 Units total) by mouth every 7 (seven) days. 5 capsule 1   famotidine (PEPCID) 40 MG tablet Take 1 tablet (40 mg total) by mouth 2 (two) times daily. 60 tablet 5   HYDROcodone-acetaminophen (NORCO/VICODIN) 5-325 MG tablet Take 1 tablet by mouth every 6 (six) hours as needed. (Patient not taking: Reported on 01/25/2022) 10 tablet 0   ketorolac (TORADOL) 10 MG tablet Take 1 tablet (10 mg total) by mouth every 8 (eight) hours as needed. (Patient not taking: Reported on 01/25/2022) 15 tablet 0   levocetirizine (XYZAL) 5  MG tablet Take 1 tablet (5 mg total) by mouth in the morning and at bedtime. 60 tablet 5   ondansetron (ZOFRAN-ODT) 8 MG disintegrating tablet Take 1 tablet (8 mg total) by mouth every 8 (eight) hours as needed for nausea or vomiting. (Patient not taking: Reported on 01/25/2022) 8 tablet 0   potassium chloride SA (KLOR-CON M) 20 MEQ tablet Take 1 tablet (20 mEq total) by mouth 2 (two) times daily. (Patient not taking: Reported on 01/25/2022) 40 tablet 0   No current facility-administered medications for this visit.    Allergies  Allergen Reactions   Shellfish Allergy Shortness Of Breath, Itching and Swelling  Mouth became swollen, throat started itching, and developed shortness of breath   Sulfa Antibiotics Itching, Shortness Of Breath and Swelling    Mouth became swollen, throat started itching, and developed shortness of breath   Adhesive [Tape] Itching    Depends on the type of medical tape    REVIEW OF SYSTEMS (negative unless checked):   Cardiac:  '[]'$  Chest pain or chest pressure? '[]'$  Shortness of breath upon activity? '[]'$  Shortness of breath when lying flat? '[]'$  Irregular heart rhythm?  Vascular:  '[]'$  Pain in calf, thigh, or hip brought on by walking? '[]'$  Pain in feet at night that wakes you up from your sleep? '[]'$  Blood clot in your veins? '[]'$  Leg swelling?  Pulmonary:  '[]'$  Oxygen at home? '[]'$  Productive cough? '[]'$  Wheezing?  Neurologic:  '[]'$  Sudden weakness in arms or legs? '[]'$  Sudden numbness in arms or legs? '[]'$  Sudden onset of difficult speaking or slurred speech? '[]'$  Temporary loss of vision in one eye? '[]'$  Problems with dizziness?  Gastrointestinal:  '[]'$  Blood in stool? '[]'$  Vomited blood?  Genitourinary:  '[]'$  Burning when urinating? '[]'$  Blood in urine?  Psychiatric:  '[]'$  Major depression  Hematologic:  '[]'$  Bleeding problems? '[]'$  Problems with blood clotting?  Dermatologic:  '[]'$  Rashes or ulcers?  Constitutional:  '[]'$  Fever or chills?  Ear/Nose/Throat:  '[]'$  Change  in hearing? '[]'$  Nose bleeds? '[]'$  Sore throat?  Musculoskeletal:  '[]'$  Back pain? '[]'$  Joint pain? '[]'$  Muscle pain?   Physical Examination     Vitals:   01/25/22 1232  BP: 139/86  Pulse: 85  Resp: 14  Temp: 97.7 F (36.5 C)  TempSrc: Temporal  SpO2: 97%  Weight: (!) 337 lb (152.9 kg)  Height: '5\' 1"'$  (1.549 m)   Body mass index is 63.68 kg/m.  General:  WDWN in NAD; vital signs documented above Gait: Normal HENT: WNL, normocephalic Pulmonary: normal non-labored breathing Cardiac: regular HR Vascular Exam/Pulses: Extremities: without varicose veins, without reticular veins, without edema, without stasis pigmentation, without lipodermatosclerosis, without ulcers Musculoskeletal: no muscle wasting or atrophy  Neurologic: A&O X 3;  No focal weakness or paresthesias are detected Psychiatric:  The pt has Normal affect.  Non-invasive Vascular Imaging   BLE Venous Insufficiency Duplex (01/25/22):  LLE: No DVT and SVT GSV reflux  SFJ, prox and mid calf GSV diameter 0.26-0.51 No SSV reflux No deep venous reflux   Medical Decision Making   THAMARA LEGER is a 36 y.o. female who presents with: LLE chronic venous insufficiency with swelling and pain.  Her duplex today shows no DVT or SVT. She has no deep venous insufficiency. She does have reflux in her GSV at the [facility] and in the calf. Based off of today's duplex she could potentially be a candidate for venous ablation. However, due to patients body habitus she would not be a good candidate for an ablation at this time due to depth of the veins in her leg. Based on the patient's history and examination, I recommend: daily elevation of 20-30 minutes above level of heart, daily compression stocking use, exercise, weight reduction, refraining from prolonged sitting or standing. Had a long discussion with patient regarding exercise regimen and importance of weight reduction in helping her symptoms and that she could potentially be a candidate  for a venous ablation in the future if she could lose weight We do not carry adequate size stockings at our office for compression. Patient provided with information for [employer] to contact them regarding special order She will  follow up as needed if she has new or worsening symptoms   Karoline Caldwell, PA-C [facility] Office: [phone]  01/25/2022, 1:43 PM  Clinic MD: Trula Slade

## 2022-01-25 ENCOUNTER — Ambulatory Visit (HOSPITAL_COMMUNITY)
Admission: RE | Admit: 2022-01-25 | Discharge: 2022-01-25 | Disposition: A | Discharge status: Home / Self Care | Payer: 59 | Source: Ambulatory Visit | Attending: Surgery | Admitting: Surgery

## 2022-01-25 ENCOUNTER — Ambulatory Visit: Payer: 59 | Admitting: Physician Assistant

## 2022-01-25 VITALS — BP 139/86 | HR 85 | Temp 97.7°F | Resp 14 | Ht 61.0 in | Wt 337.0 lb

## 2022-01-25 DIAGNOSIS — M7989 Other specified soft tissue disorders: Secondary | ICD-10-CM | POA: Insufficient documentation

## 2022-01-25 DIAGNOSIS — I872 Venous insufficiency (chronic) (peripheral): Secondary | ICD-10-CM

## 2022-01-26 ENCOUNTER — Ambulatory Visit (INDEPENDENT_AMBULATORY_CARE_PROVIDER_SITE_OTHER): Payer: 59 | Admitting: Family Medicine

## 2022-01-26 ENCOUNTER — Ambulatory Visit: Payer: 59 | Admitting: Family Medicine

## 2022-01-26 ENCOUNTER — Encounter: Payer: Self-pay | Admitting: Family Medicine

## 2022-01-26 VITALS — BP 130/72 | HR 89 | Ht 62.0 in | Wt 340.1 lb

## 2022-01-26 DIAGNOSIS — Z6841 Body Mass Index (BMI) 40.0 and over, adult: Secondary | ICD-10-CM | POA: Diagnosis not present

## 2022-01-26 DIAGNOSIS — R609 Edema, unspecified: Secondary | ICD-10-CM | POA: Diagnosis not present

## 2022-01-26 DIAGNOSIS — K219 Gastro-esophageal reflux disease without esophagitis: Secondary | ICD-10-CM | POA: Diagnosis not present

## 2022-01-26 DIAGNOSIS — Z23 Encounter for immunization: Secondary | ICD-10-CM

## 2022-01-26 DIAGNOSIS — R7301 Impaired fasting glucose: Secondary | ICD-10-CM

## 2022-01-26 DIAGNOSIS — T7840XD Allergy, unspecified, subsequent encounter: Secondary | ICD-10-CM

## 2022-01-26 DIAGNOSIS — E559 Vitamin D deficiency, unspecified: Secondary | ICD-10-CM | POA: Diagnosis not present

## 2022-01-26 DIAGNOSIS — R0602 Shortness of breath: Secondary | ICD-10-CM

## 2022-01-26 MED ORDER — LEVOCETIRIZINE DIHYDROCHLORIDE 5 MG PO TABS: 5.0000 mg | ORAL_TABLET | Freq: Two times a day (BID) | ORAL | 0 refills | Status: DC

## 2022-01-26 MED ORDER — FLUTICASONE FUROATE-VILANTEROL 100-25 MCG/ACT IN AEPB: 1.0000 | INHALATION_SPRAY | Freq: Every day | RESPIRATORY_TRACT | 5 refills | Status: AC

## 2022-01-26 MED ORDER — VITAMIN D (ERGOCALCIFEROL) 1.25 MG (50000 UNIT) PO CAPS: 50000.0000 [IU] | ORAL_CAPSULE | ORAL | 2 refills | Status: DC

## 2022-01-26 NOTE — Patient Instructions (Addendum)
I appreciate the opportunity to provide care to you today!    Follow up:  1 month  Labs: please stop by the lab today to get your blood drawn (TSH, Lipid profile, HgA1c, Vit D)  Please pick up your refills at the pharmacy     Please continue to a heart-healthy diet and increase your physical activities. Try to exercise for 52mns at least three times a week.      It was a pleasure to see you and I look forward to continuing to work together on your health and well-being. Please do not hesitate to call the office if you need care or have questions about your care.   Have a wonderful day and week. With Gratitude, GAlvira MondayMSN, FNP-BC

## 2022-01-26 NOTE — Progress Notes (Unsigned)
Established Patient Office Visit  Subjective:  Patient ID: Jessica Sanchez, female    DOB: [birth year]  Age: 36 y.o. MRN: [id number]  CC:  Chief Complaint  Patient presents with   Follow-up    Pt would like to discuss weight concerns, has been trying to eat better but no change in weight. Went to see vein specialist yesterday, went to see sleep specialist they recommended a cpap machine waiting to hear from them.     HPI Jessica Sanchez is a 36 y.o. female with past medical history of obesity, OSA, GERD, presents for f/u of  chronic medical conditions.  Obesity: the patient would like to discuss weight loss options. She noted increased weight gain and has been implementing lifestyle changes.  Venous insufficiency: she followed up with vascular and vein specialist on 01/25/22 for chronic bilateral lower extremity venous insufficiency with swelling and pain. Her duplex today shows no DVT or SVT. She was noted not to have deep venous insufficiency but rather reflux in her GSV at the [facility] and calf. Conservative management was encouraged, and the patient was encouraged to maintain a healthier weight to be a candidate for venous ablation in the future.    Past Medical History:  Diagnosis Date   Angio-edema    Arthritis    right shoulder   Dyspnea    Dysrhythmia    hx of SVT   Fibroid (bleeding) (uterine)    Fibroid, uterine    H/O metrorrhagia    Hypertension    IUD (intrauterine device) in place 07/11/2015   Menorrhagia    Obesity, Class III, BMI 40-49.9 (morbid obesity) ([place]) 11/11/2018   Sleep apnea    uses CPAP - setting 11   SVT (supraventricular tachycardia) ([place])     Past Surgical History:  Procedure Laterality Date   DILATATION AND CURETTAGE/HYSTEROSCOPY WITH MINERVA N/A 12/01/2021   Procedure: DILATATION AND CURETTAGE/HYSTEROSCOPY WITH MINERVA;  Surgeon: [name], MD;  Location: [facility];  Service: Gynecology;  Laterality: N/A;   HYSTEROSCOPY N/A 09/17/2020   Procedure:  HYSTEROSCOPY;  Surgeon: [name], MD;  Location: [facility];  Service: Gynecology;  Laterality: N/A;   HYSTEROSCOPY WITH D & C N/A 09/27/2013   Procedure: DILATATION AND CURETTAGE /HYSTEROSCOPY and insertion of Mirena IUD ;  Surgeon: [name]. Harrington Challenger, MD;  Location: [facility];  Service: Gynecology;  Laterality: N/A;   IUD REMOVAL N/A 09/17/2020   Procedure: INTRAUTERINE DEVICE (IUD) REMOVAL (2);  Surgeon: [name], MD;  Location: [facility];  Service: Gynecology;  Laterality: N/A;   MYOMECTOMY  02/03/2011   Procedure: MYOMECTOMY;  Surgeon: [name], MD;  Location: [facility];  Service: Gynecology;  Laterality: N/A;   SVT ABLATION N/A 11/08/2018   Procedure: SVT ABLATION;  Surgeon: [name], MD;  Location: [facility];  Service: Cardiovascular;  Laterality: N/A;   SVT ABLATION N/A 12/26/2020   Procedure: SVT ABLATION;  Surgeon: [name], MD;  Location: [facility];  Service: Cardiovascular;  Laterality: N/A;   TONSILLECTOMY     TYMPANOSTOMY TUBE PLACEMENT      Family History  Problem Relation Age of Onset   Cirrhosis Mother    Diabetes Mother    Cancer Maternal Grandfather    Hypertension Sister    Diabetes Sister    Anesthesia problems Neg Hx    Hypotension Neg Hx    Malignant hyperthermia Neg Hx    Pseudochol deficiency Neg Hx  Social History   Socioeconomic History   Marital status: Single    Spouse name: Not on file   Number of children: Not on file   Years of education: Not on file   Highest education level: Not on file  Occupational History   Not on file  Tobacco Use   Smoking status: Never   Smokeless tobacco: Never   Tobacco comments:    socially  Vaping Use   Vaping Use: Never used  Substance and Sexual Activity   Alcohol use: Yes    Comment: occasional   Drug use: Yes    Types: Marijuana    Comment: on occ   Sexual activity: Not Currently    Birth control/protection: None, Condom  Other Topics Concern   Not on file  Social  History Narrative   Not on file   Social Determinants of Health   Financial Resource Strain: Not on file  Food Insecurity: Not on file  Transportation Needs: Not on file  Physical Activity: Not on file  Stress: Not on file  Social Connections: Not on file  Intimate Partner Violence: Not on file    Outpatient Medications Prior to Visit  Medication Sig Dispense Refill   albuterol (VENTOLIN HFA) 108 (90 Base) MCG/ACT inhaler Inhale 2 puffs into the lungs every 6 (six) hours as needed for wheezing or shortness of breath. 8 g 0   flecainide (TAMBOCOR) 100 MG tablet Take 150 mg by mouth 2 (two) times daily.     furosemide (LASIX) 20 MG tablet Take 1 tablet (20 mg total) by mouth daily. 30 tablet 0   megestrol (MEGACE) 40 MG tablet Take 3 tablets (120 mg total) by mouth daily for 5 days, THEN 2 tablets (80 mg total) daily for 5 days, THEN 1 tablet (40 mg total) daily for 20 days. 45 tablet 0   Multiple Vitamins-Calcium (ONE-A-DAY WOMENS PO) Take 1 tablet by mouth daily.     potassium chloride SA (KLOR-CON M) 20 MEQ tablet Take 1 tablet (20 mEq total) by mouth 2 (two) times daily. 40 tablet 0   hydrochlorothiazide (MICROZIDE) 12.5 MG capsule Take 1 capsule (12.5 mg total) by mouth daily. 30 capsule 0   levocetirizine (XYZAL) 5 MG tablet Take 1 tablet (5 mg total) by mouth in the morning and at bedtime. 60 tablet 5   ondansetron (ZOFRAN-ODT) 8 MG disintegrating tablet Take 1 tablet (8 mg total) by mouth every 8 (eight) hours as needed for nausea or vomiting. 8 tablet 0   Vitamin D, Ergocalciferol, (DRISDOL) 1.25 MG (50000 UNIT) CAPS capsule Take 1 capsule (50,000 Units total) by mouth every 7 (seven) days. 5 capsule 1   famotidine (PEPCID) 40 MG tablet Take 1 tablet (40 mg total) by mouth 2 (two) times daily. 60 tablet 5   HYDROcodone-acetaminophen (NORCO/VICODIN) 5-325 MG tablet Take 1 tablet by mouth every 6 (six) hours as needed. (Patient not taking: Reported on 01/25/2022) 10 tablet 0    ketorolac (TORADOL) 10 MG tablet Take 1 tablet (10 mg total) by mouth every 8 (eight) hours as needed. (Patient not taking: Reported on 01/25/2022) 15 tablet 0   No facility-administered medications prior to visit.    Allergies  Allergen Reactions   Shellfish Allergy Shortness Of Breath, Itching and Swelling    Mouth became swollen, throat started itching, and developed shortness of breath   Sulfa Antibiotics Itching, Shortness Of Breath and Swelling    Mouth became swollen, throat started itching, and developed shortness of breath  Adhesive [Tape] Itching    Depends on the type of medical tape    ROS Review of Systems  Constitutional:  Negative for fatigue and fever.  HENT:  Positive for rhinorrhea and sneezing.   Eyes:  Positive for itching.  Respiratory:  Negative for chest tightness.   Gastrointestinal:  Negative for diarrhea and nausea.  Psychiatric/Behavioral:  Negative for self-injury and suicidal ideas.       Objective:    Physical Exam Constitutional:      Appearance: She is obese.  HENT:     Head: Normocephalic.  Cardiovascular:     Rate and Rhythm: Normal rate and regular rhythm.     Pulses: Normal pulses.     Heart sounds: Normal heart sounds.  Pulmonary:     Effort: Pulmonary effort is normal.     Breath sounds: Normal breath sounds.  Musculoskeletal:     Right lower leg: Edema present.     Left lower leg: Edema present.     BP 130/72 (BP Location: Left Arm)   Pulse 89   Ht '5\' 2"'  (1.575 m)   Wt (!) 340 lb 1.3 oz (154.3 kg)   SpO2 95%   BMI 62.20 kg/m  Wt Readings from Last 3 Encounters:  01/26/22 (!) 340 lb 1.3 oz (154.3 kg)  01/25/22 (!) 337 lb (152.9 kg)  01/20/22 (!) 337 lb 12.8 oz (153.2 kg)    Lab Results  Component Value Date   TSH 2.190 10/27/2021   Lab Results  Component Value Date   WBC 5.7 01/13/2022   HGB 12.6 01/13/2022   HCT 37.0 01/13/2022   MCV 86.1 01/13/2022   PLT 240 01/13/2022   Lab Results  Component Value Date    NA 136 01/13/2022   K 4.0 01/13/2022   CO2 26 01/13/2022   GLUCOSE 91 01/13/2022   BUN 11 01/13/2022   CREATININE 0.50 01/13/2022   BILITOT 0.8 12/27/2021   ALKPHOS 61 12/27/2021   AST 15 12/27/2021   ALT 17 12/27/2021   PROT 7.4 12/27/2021   ALBUMIN 3.9 12/27/2021   CALCIUM 9.1 01/13/2022   ANIONGAP 8 01/13/2022   EGFR 115 11/27/2021   Lab Results  Component Value Date   CHOL 141 10/27/2021   Lab Results  Component Value Date   HDL 33 (L) 10/27/2021   Lab Results  Component Value Date   LDLCALC 97 10/27/2021   Lab Results  Component Value Date   TRIG 48 10/27/2021   Lab Results  Component Value Date   CHOLHDL 4.3 10/27/2021   Lab Results  Component Value Date   HGBA1C 6.0 (H) 10/27/2021      Assessment & Plan:   Problem List Items Addressed This Visit       Digestive   Gastroesophageal reflux disease    Stable on Pepcid 20 mg BID Encouraged to continue treatment regimen        Other   Morbid obesity with body mass index (BMI) of 60.0 to 69.9 in adult [facility]) - Primary    The patient would like to discuss weight loss options She noted increased weight gain and has been implementing lifestyle changes Given her hx of arrhythmia, phentermine would not be a good option for the patient Encouraged to call her insurance to verify if they would cover  Wegovy Encouraged to continue heart-healthy diet and exercising three times weekly      Peripheral edema    +2 edema palpated  she followed up with vascular and  vein specialist on 01/25/22 for chronic bilateral lower extremity venous insufficiency with swelling and pain Her duplex today shows no DVT or SVT She was noted not to have deep venous insufficiency but rather reflux in her GSV at the [facility] and calf Conservative management was encouraged, and the patient was encouraged to maintain a healthier weight to be a candidate for venous ablation in the future        Allergy    Xyzal 5 mg ordered for relief  of allergy symptoms      Need for immunization against influenza    Patient educated on CDC recommendation for the vaccine. Verbal consent was obtained from the patient, vaccine administered by nurse, no sign of adverse reactions noted at this time. Patient education on arm soreness and use of tylenol or ibuprofen for this patient  was discussed. Patient educated on the signs and symptoms of adverse effect and advise to contact the office if they occur.      Other Visit Diagnoses     SOB (shortness of breath)       Relevant Medications   levocetirizine (XYZAL) 5 MG tablet   fluticasone furoate-vilanterol (BREO ELLIPTA) 100-25 MCG/ACT AEPB   Flu vaccine need       Relevant Orders   Flu Vaccine QUAD 6+ mos PF IM (Fluarix Quad PF) (Completed)   Vitamin D deficiency       Relevant Medications   Vitamin D, Ergocalciferol, (DRISDOL) 1.25 MG (50000 UNIT) CAPS capsule   Other Relevant Orders   Vitamin D (25 hydroxy)   IFG (impaired fasting glucose)       Relevant Orders   Lipid Profile   Hemoglobin A1C   TSH + free T4       Meds ordered this encounter  Medications   levocetirizine (XYZAL) 5 MG tablet    Sig: Take 1 tablet (5 mg total) by mouth in the morning and at bedtime.    Dispense:  60 tablet    Refill:  0   fluticasone furoate-vilanterol (BREO ELLIPTA) 100-25 MCG/ACT AEPB    Sig: Inhale 1 puff into the lungs daily.    Dispense:  30 each    Refill:  5   Vitamin D, Ergocalciferol, (DRISDOL) 1.25 MG (50000 UNIT) CAPS capsule    Sig: Take 1 capsule (50,000 Units total) by mouth every 7 (seven) days.    Dispense:  5 capsule    Refill:  2    Follow-up: Return in about 1 month (around 02/25/2022) for weight.    [name], FNP

## 2022-01-27 ENCOUNTER — Telehealth: Payer: Self-pay | Admitting: Pulmonary Disease

## 2022-01-27 DIAGNOSIS — Z23 Encounter for immunization: Secondary | ICD-10-CM | POA: Insufficient documentation

## 2022-01-27 DIAGNOSIS — G4733 Obstructive sleep apnea (adult) (pediatric): Secondary | ICD-10-CM

## 2022-01-27 NOTE — Assessment & Plan Note (Signed)
Patient educated on CDC recommendation for the vaccine. Verbal consent was obtained from the patient, vaccine administered by nurse, no sign of adverse reactions noted at this time. Patient education on arm soreness and use of tylenol or ibuprofen for this patient  was discussed. Patient educated on the signs and symptoms of adverse effect and advise to contact the office if they occur.  

## 2022-01-27 NOTE — Telephone Encounter (Signed)
Pt states [facility] called pt about CPAP, s/w Gae Bon.  Gae Bon said in order for them to move forward for CPAP, pt would need another sleep study.  Pt wasn't sure if she would need to see Dr. Elsworth Soho first or go ahead and have order put in.    Dr. Elsworth Soho please advise

## 2022-01-27 NOTE — Assessment & Plan Note (Signed)
The patient would like to discuss weight loss options She noted increased weight gain and has been implementing lifestyle changes Given her hx of arrhythmia, phentermine would not be a good option for the patient Encouraged to call her insurance to verify if they would cover  Wegovy Encouraged to continue heart-healthy diet and exercising three times weekly

## 2022-01-27 NOTE — Telephone Encounter (Signed)
Called and spoke to patient. She asks that we order the sleep study to be done in the sleep lab since she is having a lot of work done to her home right now. Dr. Elsworth Soho are you ok with this? I explained to patient there is a possibility that insurance may not approve a sleep stud in lab and she would like to try to proceed.

## 2022-01-27 NOTE — Assessment & Plan Note (Signed)
+  2 edema palpated  she followed up with vascular and vein specialist on 01/25/22 for chronic bilateral lower extremity venous insufficiency with swelling and pain Her duplex today shows no DVT or SVT She was noted not to have deep venous insufficiency but rather reflux in her GSV at the [facility] and calf Conservative management was encouraged, and the patient was encouraged to maintain a healthier weight to be a candidate for venous ablation in the future

## 2022-01-27 NOTE — Progress Notes (Deleted)
PCP:  [name], [place] [facility] Cardiologist: None Electrophysiologist: [name], MD   Jessica Sanchez is a 36 y.o. female seen today for [name], MD for post hospital follow up.    Seen in ED 9/13 for palpitations/SVT in 140s by her apple watch. Work up in ED was unremarkable.  CTA without PE, focal pleural thickening between ribs 7-8 noted, felt to be chronic scarring vs atelectasis given lack of infectious symptoms.   Since discharge from hospital the patient reports doing ***.  she denies chest pain, palpitations, dyspnea, PND, orthopnea, nausea, vomiting, dizziness, syncope, edema, weight gain, or early satiety.   Past Medical History:  Diagnosis Date   Angio-edema    Arthritis    right shoulder   Dyspnea    Dysrhythmia    hx of SVT   Fibroid (bleeding) (uterine)    Fibroid, uterine    H/O metrorrhagia    Hypertension    IUD (intrauterine device) in place 07/11/2015   Menorrhagia    Obesity, Class III, BMI 40-49.9 (morbid obesity) ([place]) 11/11/2018   Sleep apnea    uses CPAP - setting 11   SVT (supraventricular tachycardia) ([place])    Past Surgical History:  Procedure Laterality Date   DILATATION AND CURETTAGE/HYSTEROSCOPY WITH MINERVA N/A 12/01/2021   Procedure: DILATATION AND CURETTAGE/HYSTEROSCOPY WITH MINERVA;  Surgeon: [name], MD;  [facility];  Service: Gynecology;  Laterality: N/A;   HYSTEROSCOPY N/A 09/17/2020   Procedure: HYSTEROSCOPY;  Surgeon: [name], MD;  Location: [facility];  Service: Gynecology;  Laterality: N/A;   HYSTEROSCOPY WITH D & C N/A 09/27/2013   Procedure: DILATATION AND CURETTAGE /HYSTEROSCOPY and insertion of Mirena IUD ;  Surgeon: [name]. Harrington Challenger, MD;  Location: [place] [facility];  Service: Gynecology;  Laterality: N/A;   IUD REMOVAL N/A 09/17/2020   Procedure: INTRAUTERINE DEVICE (IUD) REMOVAL (2);  Surgeon: [name], MD;  Location: [facility];  Service: Gynecology;  Laterality: N/A;   MYOMECTOMY  02/03/2011   Procedure:  MYOMECTOMY;  Surgeon: [name], MD;  Location: [facility];  Service: Gynecology;  Laterality: N/A;   SVT ABLATION N/A 11/08/2018   Procedure: SVT ABLATION;  Surgeon: [name], MD;  Location: [place] [facility];  Service: Cardiovascular;  Laterality: N/A;   SVT ABLATION N/A 12/26/2020   Procedure: SVT ABLATION;  Surgeon: [name], MD;  Location: [place] [facility];  Service: Cardiovascular;  Laterality: N/A;   TONSILLECTOMY     TYMPANOSTOMY TUBE PLACEMENT      Current Outpatient Medications  Medication Sig Dispense Refill   albuterol (VENTOLIN HFA) 108 (90 Base) MCG/ACT inhaler Inhale 2 puffs into the lungs every 6 (six) hours as needed for wheezing or shortness of breath. 8 g 0   famotidine (PEPCID) 40 MG tablet Take 1 tablet (40 mg total) by mouth 2 (two) times daily. 60 tablet 5   flecainide (TAMBOCOR) 100 MG tablet Take 150 mg by mouth 2 (two) times daily.     fluticasone furoate-vilanterol (BREO ELLIPTA) 100-25 MCG/ACT AEPB Inhale 1 puff into the lungs daily. 30 each 5   furosemide (LASIX) 20 MG tablet Take 1 tablet (20 mg total) by mouth daily. 30 tablet 0   levocetirizine (XYZAL) 5 MG tablet Take 1 tablet (5 mg total) by mouth in the morning and at bedtime. 60 tablet 0   megestrol (MEGACE) 40 MG tablet Take 3 tablets (120 mg total) by mouth daily for 5 days, THEN 2 tablets (  80 mg total) daily for 5 days, THEN 1 tablet (40 mg total) daily for 20 days. 45 tablet 0   Multiple Vitamins-Calcium (ONE-A-DAY WOMENS PO) Take 1 tablet by mouth daily.     potassium chloride SA (KLOR-CON M) 20 MEQ tablet Take 1 tablet (20 mEq total) by mouth 2 (two) times daily. 40 tablet 0   Vitamin D, Ergocalciferol, (DRISDOL) 1.25 MG (50000 UNIT) CAPS capsule Take 1 capsule (50,000 Units total) by mouth every 7 (seven) days. 5 capsule 2   No current facility-administered medications for this visit.    Allergies  Allergen Reactions   Shellfish Allergy Shortness Of Breath, Itching and Swelling     Mouth became swollen, throat started itching, and developed shortness of breath   Sulfa Antibiotics Itching, Shortness Of Breath and Swelling    Mouth became swollen, throat started itching, and developed shortness of breath   Adhesive [Tape] Itching    Depends on the type of medical tape    Social History   Socioeconomic History   Marital status: Single    Spouse name: Not on file   Number of children: Not on file   Years of education: Not on file   Highest education level: Not on file  Occupational History   Not on file  Tobacco Use   Smoking status: Never   Smokeless tobacco: Never   Tobacco comments:    socially  Vaping Use   Vaping Use: Never used  Substance and Sexual Activity   Alcohol use: Yes    Comment: occasional   Drug use: Yes    Types: Marijuana    Comment: on occ   Sexual activity: Not Currently    Birth control/protection: None, Condom  Other Topics Concern   Not on file  Social History Narrative   Not on file   Social Determinants of Health   Financial Resource Strain: Not on file  Food Insecurity: Not on file  Transportation Needs: Not on file  Physical Activity: Not on file  Stress: Not on file  Social Connections: Not on file  Intimate Partner Violence: Not on file     Review of Systems: All other systems reviewed and are otherwise negative except as noted above.  Physical Exam: There were no vitals filed for this visit.  GEN- The patient is well appearing, alert and oriented x 3 today.   HEENT: normocephalic, atraumatic; sclera clear, conjunctiva pink; hearing intact; oropharynx clear; neck supple, no JVP Lymph- no cervical lymphadenopathy Lungs- Clear to ausculation bilaterally, normal work of breathing.  No wheezes, rales, rhonchi Heart- {Blank single:19197::"Regular","Irregularly irregular"} rate and rhythm, no murmurs, rubs or gallops, PMI not laterally displaced GI- soft, non-tender, non-distended, bowel sounds present, no  hepatosplenomegaly Extremities- {EDEMA YKZLD:35701} peripheral edema. no clubbing or cyanosis; DP/PT/radial pulses 2+ bilaterally MS- no significant deformity or atrophy Skin- warm and dry, no rash or lesion Psych- euthymic mood, full affect Neuro- strength and sensation are intact  EKG is ordered. Personal review of EKG from today shows ***  Additional studies reviewed include: Previous EP office notes.   Assessment and Plan:  1. SVT S/p ablation of a left sided AVNRT 12/2020 EKG today shows *** Continue flecainide 150 mg BID  2. Obesity There is no height or weight on file to calculate BMI.  Encouraged lifestyle modification   3. HTN Stable on current regimen   4. OSA  Encouraged nightly CPAP   Follow up with {EPMDS:28135} in {EPFOLLOW XB:93903}  Shirley Friar, PA-C  01/27/22 8:47 AM

## 2022-01-27 NOTE — Assessment & Plan Note (Signed)
Stable on Pepcid 20 mg BID Encouraged to continue treatment regimen

## 2022-01-27 NOTE — Assessment & Plan Note (Signed)
Xyzal 5 mg ordered for relief of allergy symptoms

## 2022-01-28 ENCOUNTER — Ambulatory Visit: Payer: 59 | Admitting: Student

## 2022-01-28 DIAGNOSIS — I471 Supraventricular tachycardia: Secondary | ICD-10-CM

## 2022-01-28 DIAGNOSIS — R002 Palpitations: Secondary | ICD-10-CM

## 2022-01-28 DIAGNOSIS — G4733 Obstructive sleep apnea (adult) (pediatric): Secondary | ICD-10-CM

## 2022-01-28 NOTE — Telephone Encounter (Signed)
Split study ordered.

## 2022-01-29 ENCOUNTER — Encounter: Payer: Self-pay | Admitting: Family Medicine

## 2022-01-29 ENCOUNTER — Ambulatory Visit (INDEPENDENT_AMBULATORY_CARE_PROVIDER_SITE_OTHER): Payer: 59 | Admitting: Family Medicine

## 2022-01-29 DIAGNOSIS — Z6841 Body Mass Index (BMI) 40.0 and over, adult: Secondary | ICD-10-CM | POA: Diagnosis not present

## 2022-01-29 MED ORDER — WEGOVY 0.25 MG/0.5ML ~~LOC~~ SOAJ: 0.2500 mg | SUBCUTANEOUS | 0 refills | Status: DC

## 2022-01-29 NOTE — Patient Instructions (Addendum)
I appreciate the opportunity to provide care to you today!    Follow up:  02/24/22    Please continue to a heart-healthy diet and increase your physical activities. Try to exercise for 83mns at least three times a week.      It was a pleasure to see you and I look forward to continuing to work together on your health and well-being. Please do not hesitate to call the office if you need care or have questions about your care.   Have a wonderful day and week. With Gratitude, GAlvira MondayMSN, FNP-BC

## 2022-01-29 NOTE — Assessment & Plan Note (Signed)
She denies personal or family history of thyroid cell tumor Will start patient on Wegovy Prior authorization initiated

## 2022-01-29 NOTE — Progress Notes (Signed)
Established Patient Office Visit  Subjective:  Patient ID: Jessica Sanchez, female    DOB: Dec 01, 1985  Age: 36 y.o. MRN: [id number]  CC:  Chief Complaint  Patient presents with   Obesity    Pt here to discuss PA for wegovy, insurance doesn't cover medication needs a prior authorization.     HPI Jessica Sanchez is a 36 y.o. female with past medical history of obesity presents for f/u of  chronic medical conditions.  Obesity: She she noted that she will need prior authorization for wegovy as her insurance does not cover the medication.  Past Medical History:  Diagnosis Date   Angio-edema    Arthritis    right shoulder   Dyspnea    Dysrhythmia    hx of SVT   Fibroid (bleeding) (uterine)    Fibroid, uterine    H/O metrorrhagia    Hypertension    IUD (intrauterine device) in place 07/11/2015   Menorrhagia    Obesity, Class III, BMI 40-49.9 (morbid obesity) ([place]) 11/11/2018   Sleep apnea    uses CPAP - setting 11   SVT (supraventricular tachycardia) ([place])     Past Surgical History:  Procedure Laterality Date   DILATATION AND CURETTAGE/HYSTEROSCOPY WITH MINERVA N/A 12/01/2021   Procedure: DILATATION AND CURETTAGE/HYSTEROSCOPY WITH MINERVA;  Surgeon: [name], MD;  Location: [facility];  Service: Gynecology;  Laterality: N/A;   HYSTEROSCOPY N/A 09/17/2020   Procedure: HYSTEROSCOPY;  Surgeon: [name], MD;  Location: [facility];  Service: Gynecology;  Laterality: N/A;   HYSTEROSCOPY WITH D & C N/A 09/27/2013   Procedure: DILATATION AND CURETTAGE /HYSTEROSCOPY and insertion of Mirena IUD ;  Surgeon: [name]. Harrington Challenger, MD;  Location: [place] [facility];  Service: Gynecology;  Laterality: N/A;   IUD REMOVAL N/A 09/17/2020   Procedure: INTRAUTERINE DEVICE (IUD) REMOVAL (2);  Surgeon: [name], MD;  Location: [facility];  Service: Gynecology;  Laterality: N/A;   MYOMECTOMY  02/03/2011   Procedure: MYOMECTOMY;  Surgeon: [name], MD;  Location: [facility];  Service: Gynecology;  Laterality:  N/A;   SVT ABLATION N/A 11/08/2018   Procedure: SVT ABLATION;  Surgeon: [name], MD;  Location: [place] [facility];  Service: Cardiovascular;  Laterality: N/A;   SVT ABLATION N/A 12/26/2020   Procedure: SVT ABLATION;  Surgeon: [name], MD;  Location: [place] [facility];  Service: Cardiovascular;  Laterality: N/A;   TONSILLECTOMY     TYMPANOSTOMY TUBE PLACEMENT      Family History  Problem Relation Age of Onset   Cirrhosis Mother    Diabetes Mother    Cancer Maternal Grandfather    Hypertension Sister    Diabetes Sister    Anesthesia problems Neg Hx    Hypotension Neg Hx    Malignant hyperthermia Neg Hx    Pseudochol deficiency Neg Hx     Social History   Socioeconomic History   Marital status: Single    Spouse name: Not on file   Number of children: Not on file   Years of education: Not on file   Highest education level: Not on file  Occupational History   Not on file  Tobacco Use   Smoking status: Never   Smokeless tobacco: Never   Tobacco comments:    socially  Vaping Use   Vaping Use: Never used  Substance and Sexual Activity   Alcohol use: Yes    Comment: occasional   Drug use: Yes  Types: Marijuana    Comment: on occ   Sexual activity: Not Currently    Birth control/protection: None, Condom  Other Topics Concern   Not on file  Social History Narrative   Not on file   Social Determinants of Health   Financial Resource Strain: Not on file  Food Insecurity: Not on file  Transportation Needs: Not on file  Physical Activity: Not on file  Stress: Not on file  Social Connections: Not on file  Intimate Partner Violence: Not on file    Outpatient Medications Prior to Visit  Medication Sig Dispense Refill   albuterol (VENTOLIN HFA) 108 (90 Base) MCG/ACT inhaler Inhale 2 puffs into the lungs every 6 (six) hours as needed for wheezing or shortness of breath. 8 g 0   flecainide (TAMBOCOR) 100 MG tablet Take 150 mg by mouth 2 (two) times  daily.     fluticasone furoate-vilanterol (BREO ELLIPTA) 100-25 MCG/ACT AEPB Inhale 1 puff into the lungs daily. 30 each 5   furosemide (LASIX) 20 MG tablet Take 1 tablet (20 mg total) by mouth daily. 30 tablet 0   levocetirizine (XYZAL) 5 MG tablet Take 1 tablet (5 mg total) by mouth in the morning and at bedtime. 60 tablet 0   Multiple Vitamins-Calcium (ONE-A-DAY WOMENS PO) Take 1 tablet by mouth daily.     potassium chloride SA (KLOR-CON M) 20 MEQ tablet Take 1 tablet (20 mEq total) by mouth 2 (two) times daily. 40 tablet 0   Vitamin D, Ergocalciferol, (DRISDOL) 1.25 MG (50000 UNIT) CAPS capsule Take 1 capsule (50,000 Units total) by mouth every 7 (seven) days. 5 capsule 2   famotidine (PEPCID) 40 MG tablet Take 1 tablet (40 mg total) by mouth 2 (two) times daily. 60 tablet 5   No facility-administered medications prior to visit.    Allergies  Allergen Reactions   Shellfish Allergy Shortness Of Breath, Itching and Swelling    Mouth became swollen, throat started itching, and developed shortness of breath   Sulfa Antibiotics Itching, Shortness Of Breath and Swelling    Mouth became swollen, throat started itching, and developed shortness of breath   Adhesive [Tape] Itching    Depends on the type of medical tape    ROS Review of Systems  Constitutional:  Negative for appetite change, chills and fever.  Respiratory:  Negative for chest tightness and shortness of breath.   Cardiovascular:  Negative for chest pain and palpitations.  Gastrointestinal:  Negative for nausea and vomiting.  Psychiatric/Behavioral:  Negative for self-injury and suicidal ideas.       Objective:    Physical Exam Constitutional:      Appearance: She is obese.  HENT:     Head: Normocephalic.  Cardiovascular:     Rate and Rhythm: Normal rate and regular rhythm.     Pulses: Normal pulses.     Heart sounds: Normal heart sounds.  Pulmonary:     Effort: Pulmonary effort is normal.     Breath sounds:  Normal breath sounds.     BP 133/85   Pulse 87   Ht '5\' 2"'  (1.575 m)   Wt (!) 345 lb (156.5 kg)   SpO2 95%   BMI 63.10 kg/m  Wt Readings from Last 3 Encounters:  01/29/22 (!) 345 lb (156.5 kg)  01/26/22 (!) 340 lb 1.3 oz (154.3 kg)  01/25/22 (!) 337 lb (152.9 kg)    Lab Results  Component Value Date   TSH 2.190 10/27/2021   Lab Results  Component Value Date   WBC 5.7 01/13/2022   HGB 12.6 01/13/2022   HCT 37.0 01/13/2022   MCV 86.1 01/13/2022   PLT 240 01/13/2022   Lab Results  Component Value Date   NA 136 01/13/2022   K 4.0 01/13/2022   CO2 26 01/13/2022   GLUCOSE 91 01/13/2022   BUN 11 01/13/2022   CREATININE 0.50 01/13/2022   BILITOT 0.8 12/27/2021   ALKPHOS 61 12/27/2021   AST 15 12/27/2021   ALT 17 12/27/2021   PROT 7.4 12/27/2021   ALBUMIN 3.9 12/27/2021   CALCIUM 9.1 01/13/2022   ANIONGAP 8 01/13/2022   EGFR 115 11/27/2021   Lab Results  Component Value Date   CHOL 141 10/27/2021   Lab Results  Component Value Date   HDL 33 (L) 10/27/2021   Lab Results  Component Value Date   LDLCALC 97 10/27/2021   Lab Results  Component Value Date   TRIG 48 10/27/2021   Lab Results  Component Value Date   CHOLHDL 4.3 10/27/2021   Lab Results  Component Value Date   HGBA1C 6.0 (H) 10/27/2021      Assessment & Plan:   Problem List Items Addressed This Visit       Other   Morbid obesity with body mass index (BMI) of 60.0 to 69.9 in adult [facility]) - Primary    She denies personal or family history of thyroid cell tumor Will start patient on Wegovy Prior authorization initiated       Relevant Medications   Semaglutide-Weight Management (WEGOVY) 0.25 MG/0.5ML SOAJ    Meds ordered this encounter  Medications   Semaglutide-Weight Management (WEGOVY) 0.25 MG/0.5ML SOAJ    Sig: Inject 0.25 mg into the skin once a week for 28 days.    Dispense:  2 mL    Refill:  0    Follow-up: Return if symptoms worsen or fail to improve.    [name], FNP

## 2022-02-03 ENCOUNTER — Ambulatory Visit: Payer: 59 | Admitting: Family Medicine

## 2022-02-03 ENCOUNTER — Ambulatory Visit (INDEPENDENT_AMBULATORY_CARE_PROVIDER_SITE_OTHER): Payer: 59 | Admitting: Family Medicine

## 2022-02-03 ENCOUNTER — Encounter: Payer: Self-pay | Admitting: Family Medicine

## 2022-02-03 ENCOUNTER — Ambulatory Visit: Payer: Self-pay | Admitting: Allergy & Immunology

## 2022-02-03 ENCOUNTER — Other Ambulatory Visit: Payer: Self-pay

## 2022-02-03 VITALS — BP 126/80 | HR 76 | Temp 98.6°F | Resp 18 | Ht 62.0 in | Wt 344.0 lb

## 2022-02-03 DIAGNOSIS — L508 Other urticaria: Secondary | ICD-10-CM

## 2022-02-03 DIAGNOSIS — T7800XA Anaphylactic reaction due to unspecified food, initial encounter: Secondary | ICD-10-CM | POA: Insufficient documentation

## 2022-02-03 DIAGNOSIS — J302 Other seasonal allergic rhinitis: Secondary | ICD-10-CM | POA: Insufficient documentation

## 2022-02-03 DIAGNOSIS — J3089 Other allergic rhinitis: Secondary | ICD-10-CM | POA: Diagnosis not present

## 2022-02-03 DIAGNOSIS — J4540 Moderate persistent asthma, uncomplicated: Secondary | ICD-10-CM

## 2022-02-03 DIAGNOSIS — J4530 Mild persistent asthma, uncomplicated: Secondary | ICD-10-CM | POA: Insufficient documentation

## 2022-02-03 DIAGNOSIS — R0602 Shortness of breath: Secondary | ICD-10-CM

## 2022-02-03 DIAGNOSIS — T7800XD Anaphylactic reaction due to unspecified food, subsequent encounter: Secondary | ICD-10-CM

## 2022-02-03 MED ORDER — FAMOTIDINE 40 MG PO TABS: 40.0000 mg | ORAL_TABLET | Freq: Two times a day (BID) | ORAL | 5 refills | Status: DC

## 2022-02-03 MED ORDER — LEVOCETIRIZINE DIHYDROCHLORIDE 5 MG PO TABS: 5.0000 mg | ORAL_TABLET | Freq: Two times a day (BID) | ORAL | 0 refills | Status: DC

## 2022-02-03 MED ORDER — MONTELUKAST SODIUM 10 MG PO TABS: 10.0000 mg | ORAL_TABLET | Freq: Every day | ORAL | 5 refills | Status: DC

## 2022-02-03 MED ORDER — LEVALBUTEROL TARTRATE 45 MCG/ACT IN AERO: 2.0000 | INHALATION_SPRAY | Freq: Four times a day (QID) | RESPIRATORY_TRACT | 3 refills | Status: DC | PRN

## 2022-02-03 NOTE — Progress Notes (Signed)
[address], [address] [place] [address] Dept: [phone]  FOLLOW UP NOTE  Patient ID: Jessica Sanchez, female    DOB: 1985-06-13  Age: 36 y.o. MRN: [id number] Date of Office Visit: 02/03/2022  Assessment  Chief Complaint: Asthma (Some issues with her breathing ) and Angioedema (Some swelling in her legs )  HPI Jessica Sanchez is a 36 year old female who presents to the clinic for follow-up visit.  She was last seen in this clinic on 12/21/2021 by Dr. Ernst Bowler for evaluation of asthma, allergic rhinitis, and chronic urticaria.  At today's visit, she reports that her asthma has been moderately well controlled with occasional shortness of breath and occasional wheeze.  She denies cough.  She reports symptoms are not worse with activity.  She continues Breo 100-1 puff once a day and uses albuterol about 1-2 times a week.  She does report that she experiences palpitations and tachycardia after using albuterol.  Allergic rhinitis is reported as moderately well controlled with symptoms including clear rhinorrhea, nasal congestion, sneezing, and postnasal drainage.  She continues Xyzal 5 mg twice a day and is not currently using nasal saline rinses.  She reports constant pruritus with occasional urticaria for which she continues Xyzal 5 mg twice a day and famotidine 20 mg once a day with moderate relief of symptoms.  She continues to avoid shellfish with no accidental ingestion or EpiPen use since her last visit to this clinic.  She did not obtain the lab work at her last visit to this clinic.  Her current medications are listed in the chart. Drug Allergies:  Allergies  Allergen Reactions   Shellfish Allergy Shortness Of Breath, Itching and Swelling    Mouth became swollen, throat started itching, and developed shortness of breath   Sulfa Antibiotics Itching, Shortness Of Breath and Swelling    Mouth became swollen, throat started itching, and developed shortness of breath   Adhesive [Tape]  Itching    Depends on the type of medical tape    Physical Exam: BP 126/80   Pulse 76   Temp 98.6 F (37 C)   Resp 18   Ht '5\' 2"'$  (1.575 m)   Wt (!) 344 lb (156 kg)   SpO2 98%   BMI 62.92 kg/m    Physical Exam Vitals reviewed.  Constitutional:      Appearance: Normal appearance.  HENT:     Head: Normocephalic and atraumatic.     Right Ear: Tympanic membrane normal.     Left Ear: Tympanic membrane normal.     Nose:     Comments: Bilateral nares edematous and pale with clear nasal drainage noted.  Pharynx slightly erythematous with no exudate.  Ears normal.  Eyes normal. Eyes:     Conjunctiva/sclera: Conjunctivae normal.  Cardiovascular:     Rate and Rhythm: Normal rate and regular rhythm.     Heart sounds: Normal heart sounds. No murmur heard. Pulmonary:     Effort: Pulmonary effort is normal.     Breath sounds: Normal breath sounds.     Comments: Lungs clear to auscultation Musculoskeletal:        General: Normal range of motion.     Cervical back: Normal range of motion and neck supple.  Skin:    General: Skin is warm and dry.  Neurological:     Mental Status: She is alert and oriented to person, place, and time.  Psychiatric:        Mood and Affect: Mood normal.  Behavior: Behavior normal.        Thought Content: Thought content normal.        Judgment: Judgment normal.     Diagnostics: FVC 1.96, FEV1 1.57.  Predicted FVC 2.98, predicted FEV1 2.50.  Spirometry indicates possible restriction.  This is consistent with previous spirometry readings.  Assessment and Plan: 1. Moderate persistent asthma without complication   2. Chronic urticaria   3. Seasonal and perennial allergic rhinitis   4. Anaphylactic shock due to food, subsequent encounter     No orders of the defined types were placed in this encounter.   Patient Instructions  Asthma Continue Breo 100-1 puff once a day to prevent cough or wheeze Begin montelukast 10 mg once a day to prevent  cough or wheeze. Patient cautioned that rarely some children/adults can experience behavioral changes after beginning montelukast. These side effects are rare, however, if you notice any change, notify the clinic and discontinue montelukast.  Begin Xopenex 2 puffs once every 4-6 hours as needed for cough or wheeze.  This will replace albuterol You may use Xopenex 2 puffs 5 to 15 minutes before activity to decrease cough or wheeze.  This will replace albuterol  Allergic rhinitis Continue allergen avoidance measures directed toward grass pollen, weed pollen, ragweed pollen, tree pollen, mold, dust mite, cat, dog, cockroach, and tobacco as listed below Continue cetirizine 10 mg once a day as needed for runny nose or itch Begin Flonase 2 sprays in each nostril once a day as needed for stuffy nose Consider saline nasal rinses as needed for nasal symptoms. Use this before any medicated nasal sprays for best result Consider allergen immunotherapy if your symptoms are not well controlled with the treatment plan as listed below.  Written information provided at today's visit  Hives (urticaria) Use the fewest amount of medications Cetirizine (Zyrtec) '10mg'$  twice a day and famotidine (Pepcid) 20 mg twice a day. If no symptoms for 7-14 days then decrease to. Cetirizine (Zyrtec) '10mg'$  twice a day and famotidine (Pepcid) 20 mg once a day.  If no symptoms for 7-14 days then decrease to. Cetirizine (Zyrtec) '10mg'$  twice a day.  If no symptoms for 7-14 days then decrease to. Cetirizine (Zyrtec) '10mg'$  once a day.  May use Benadryl (diphenhydramine) as needed for breakthrough hives       If symptoms return, then step up dosage Keep a detailed symptom journal including foods eaten, contact with allergens, medications taken, weather changes.   Food allergy Continue to avoid shellfish. In case of an allergic reaction, take Benadryl 50 mg capsules every 6 hours, and if life-threatening symptoms occur, inject with EpiPen  0.3 mg. Complete the labs that were ordered at your last visit to this clinic to help [place] evaluate and treat your food allergy  Call the clinic if this treatment plan is not working well for you.  Follow up in  months or sooner if needed.   Return in about 3 months (around 05/06/2022), or if symptoms worsen or fail to improve.    Thank you for the opportunity to care for this patient.  Please do not hesitate to contact me with questions.  Gareth Morgan, FNP [facility]

## 2022-02-03 NOTE — Patient Instructions (Addendum)
Asthma Continue Breo 100-1 puff once a day to prevent cough or wheeze Begin montelukast 10 mg once a day to prevent cough or wheeze. Patient cautioned that rarely some children/adults can experience behavioral changes after beginning montelukast. These side effects are rare, however, if you notice any change, notify the clinic and discontinue montelukast.  Begin Xopenex 2 puffs once every 4-6 hours as needed for cough or wheeze.  This will replace albuterol You may use Xopenex 2 puffs 5 to 15 minutes before activity to decrease cough or wheeze.  This will replace albuterol  Allergic rhinitis Continue allergen avoidance measures directed toward grass pollen, weed pollen, ragweed pollen, tree pollen, mold, dust mite, cat, dog, cockroach, and tobacco as listed below Continue cetirizine 10 mg once a day as needed for runny nose or itch Begin montelukast 10 mg once a day as listed above to help reduce allergy symptoms Begin Flonase 2 sprays in each nostril once a day as needed for stuffy nose Consider saline nasal rinses as needed for nasal symptoms. Use this before any medicated nasal sprays for best result Consider allergen immunotherapy if your symptoms are not well controlled with the treatment plan as listed below.  Written information provided at today's visit  Hives (urticaria) Use the fewest amount of medications Cetirizine (Zyrtec) '10mg'$  twice a day and famotidine (Pepcid) 20 mg twice a day. If no symptoms for 7-14 days then decrease to. Cetirizine (Zyrtec) '10mg'$  twice a day and famotidine (Pepcid) 20 mg once a day.  If no symptoms for 7-14 days then decrease to. Cetirizine (Zyrtec) '10mg'$  twice a day.  If no symptoms for 7-14 days then decrease to. Cetirizine (Zyrtec) '10mg'$  once a day.  May use Benadryl (diphenhydramine) as needed for breakthrough hives       If symptoms return, then step up dosage Keep a detailed symptom journal including foods eaten, contact with allergens, medications  taken, weather changes.   Food allergy Continue to avoid shellfish. In case of an allergic reaction, take Benadryl 50 mg capsules every 6 hours, and if life-threatening symptoms occur, inject with EpiPen 0.3 mg. Complete the labs that were ordered at your last visit to this clinic to help [place] evaluate and treat your food allergy  Call the clinic if this treatment plan is not working well for you.  Follow up in 6 months or sooner if needed.  Reducing Pollen Exposure The American Academy of Allergy, Asthma and Immunology suggests the following steps to reduce your exposure to pollen during allergy seasons. Do not hang sheets or clothing out to dry; pollen may collect on these items. Do not mow lawns or spend time around freshly cut grass; mowing stirs up pollen. Keep windows closed at night.  Keep car windows closed while driving. Minimize morning activities outdoors, a time when pollen counts are usually at their highest. Stay indoors as much as possible when pollen counts or humidity is high and on windy days when pollen tends to remain in the air longer. Use air conditioning when possible.  Many air conditioners have filters that trap the pollen spores. Use a HEPA room air filter to remove pollen form the indoor air you breathe.   Control of Mold Allergen Mold and fungi can grow on a variety of surfaces provided certain temperature and moisture conditions exist.  Outdoor molds grow on plants, decaying vegetation and soil.  The major outdoor mold, Alternaria and Cladosporium, are found in very high numbers during hot and dry conditions.  Generally, a  late Summer - Fall peak is seen for common outdoor fungal spores.  Rain will temporarily lower outdoor mold spore count, but counts rise rapidly when the rainy period ends.  The most important indoor molds are Aspergillus and Penicillium.  Dark, humid and poorly ventilated basements are ideal sites for mold growth.  The next most common sites of  mold growth are the bathroom and the kitchen.  Outdoor [employer] Use air conditioning and keep windows closed Avoid exposure to decaying vegetation. Avoid leaf raking. Avoid grain handling. Consider wearing a face mask if working in moldy areas.  Indoor Mold Control Maintain humidity below 50%. Clean washable surfaces with 5% bleach solution. Remove sources e.g. Contaminated carpets.   Control of Dog or Cat Allergen Avoidance is the best way to manage a dog or cat allergy. If you have a dog or cat and are allergic to dog or cats, consider removing the dog or cat from the home. If you have a dog or cat but don't want to find it a new home, or if your family wants a pet even though someone in the household is allergic, here are some strategies that may help keep symptoms at bay:  Keep the pet out of your bedroom and restrict it to only a few rooms. Be advised that keeping the dog or cat in only one room will not limit the allergens to that room. Don't pet, hug or kiss the dog or cat; if you do, wash your hands with soap and water. High-efficiency particulate air (HEPA) cleaners run continuously in a bedroom or living room can reduce allergen levels over time. Regular use of a high-efficiency vacuum cleaner or a central vacuum can reduce allergen levels. Giving your dog or cat a bath at least once a week can reduce airborne allergen.   Control of Dust Mite Allergen Dust mites play a major role in allergic asthma and rhinitis. They occur in environments with high humidity wherever human skin is found. Dust mites absorb humidity from the atmosphere (ie, they do not drink) and feed on organic matter (including shed human and animal skin). Dust mites are a microscopic type of insect that you cannot see with the naked eye. High levels of dust mites have been detected from mattresses, pillows, carpets, upholstered furniture, bed covers, clothes, soft toys and any woven material. The principal  allergen of the dust mite is found in its feces. A gram of dust may contain 1,000 mites and 250,000 fecal particles. Mite antigen is easily measured in the air during house cleaning activities. Dust mites do not bite and do not cause harm to humans, other than by triggering allergies/asthma.  Ways to decrease your exposure to dust mites in your home:  1. Encase mattresses, box springs and pillows with a mite-impermeable barrier or cover  2. Wash sheets, blankets and drapes weekly in hot water (130 F) with detergent and dry them in a dryer on the hot setting.  3. Have the room cleaned frequently with a vacuum cleaner and a damp dust-mop. For carpeting or rugs, vacuuming with a vacuum cleaner equipped with a high-efficiency particulate air (HEPA) filter. The dust mite allergic individual should not be in a room which is being cleaned and should wait 1 hour after cleaning before going into the room.  4. Do not sleep on upholstered furniture (eg, couches).  5. If possible removing carpeting, upholstered furniture and drapery from the home is ideal. Horizontal blinds should be eliminated in the rooms  where the person spends the most time (bedroom, study, television room). Washable vinyl, roller-type shades are optimal.  6. Remove all non-washable stuffed toys from the bedroom. Wash stuffed toys weekly like sheets and blankets above.  7. Reduce indoor humidity to less than 50%. Inexpensive humidity monitors can be purchased at most hardware stores. Do not use a humidifier as can make the problem worse and are not recommended.  Control of Cockroach Allergen Cockroach allergen has been identified as an important cause of acute attacks of asthma, especially in urban settings.  There are fifty-five species of cockroach that exist in the [place], however only three, the [place], [place] species produce allergen that can affect patients with Asthma.  Allergens can be obtained from fecal  particles, egg casings and secretions from cockroaches.    Remove food sources. Reduce access to water. Seal access and entry points. Spray runways with 0.5-1% Diazinon or Chlorpyrifos Blow boric acid power under stoves and refrigerator. Place bait stations (hydramethylnon) at feeding sites.

## 2022-02-03 NOTE — Progress Notes (Deleted)
   [address] [place] [address] Dept: [phone]  FOLLOW UP NOTE  Patient ID: Jessica Sanchez, female    DOB: 07-09-1985  Age: 36 y.o. MRN: [id number] Date of Office Visit: 02/03/2022  Assessment  Chief Complaint: No chief complaint on file.  HPI Jessica Sanchez    Drug Allergies:  Allergies  Allergen Reactions   Shellfish Allergy Shortness Of Breath, Itching and Swelling    Mouth became swollen, throat started itching, and developed shortness of breath   Sulfa Antibiotics Itching, Shortness Of Breath and Swelling    Mouth became swollen, throat started itching, and developed shortness of breath   Adhesive [Tape] Itching    Depends on the type of medical tape    Physical Exam: There were no vitals taken for this visit.   Physical Exam  Diagnostics:    Assessment and Plan: No diagnosis found.  No orders of the defined types were placed in this encounter.   There are no Patient Instructions on file for this visit.  No follow-ups on file.    Thank you for the opportunity to care for this patient.  Please do not hesitate to contact me with questions.  Gareth Morgan, FNP [facility]

## 2022-02-04 ENCOUNTER — Telehealth: Payer: Self-pay

## 2022-02-04 ENCOUNTER — Telehealth: Payer: Self-pay | Admitting: Family Medicine

## 2022-02-04 NOTE — Telephone Encounter (Signed)
Patient called and has contacted all pharmacies and not able to find a pharmacy with this medicine in stock. Per patient on back order from manufacture now for 3 to 4 months.  Semaglutide-Weight Management (WEGOVY) 0.25 MG/0.5ML SOAJ [[id number]]   Please contact patient.

## 2022-02-04 NOTE — Telephone Encounter (Signed)
Pt has to call phar before message can be sent

## 2022-02-05 ENCOUNTER — Other Ambulatory Visit: Payer: Self-pay

## 2022-02-05 NOTE — Telephone Encounter (Signed)
Spoke to pt would like to be referred to weight management.

## 2022-02-05 NOTE — Telephone Encounter (Signed)
Please place a referral to weight management for patient, thank you.

## 2022-02-05 NOTE — Telephone Encounter (Signed)
Referral placed.

## 2022-02-05 NOTE — Telephone Encounter (Signed)
Please inform the patient that she is not a candidate for oral obesity medications like phentermine because of her history of supraventricular tachycardia.  I can put a referral into weight management if she would like.

## 2022-02-10 ENCOUNTER — Encounter: Payer: Self-pay | Admitting: Internal Medicine

## 2022-02-10 ENCOUNTER — Ambulatory Visit (INDEPENDENT_AMBULATORY_CARE_PROVIDER_SITE_OTHER): Payer: 59 | Admitting: Internal Medicine

## 2022-02-10 VITALS — BP 136/80 | HR 86 | Resp 16 | Ht 62.0 in | Wt 341.8 lb

## 2022-02-10 DIAGNOSIS — K5900 Constipation, unspecified: Secondary | ICD-10-CM | POA: Diagnosis not present

## 2022-02-10 DIAGNOSIS — K5904 Chronic idiopathic constipation: Secondary | ICD-10-CM

## 2022-02-10 MED ORDER — SORBITOL 70 % PO SOLN: 15.0000 mL | Freq: Every day | ORAL | 0 refills | Status: DC | PRN

## 2022-02-10 NOTE — Progress Notes (Signed)
     CC: stomach pain  HPI:Ms.Jessica Sanchez is a 36 y.o. female who presents for evaluation of abdominal pain. For the details of today's visit, please refer to the assessment and plan.   Past Medical History:  Diagnosis Date   Angio-edema    Arthritis    right shoulder   Asthma    Dyspnea    Dysrhythmia    hx of SVT   Fibroid (bleeding) (uterine)    Fibroid, uterine    H/O metrorrhagia    Hypertension    IUD (intrauterine device) in place 07/11/2015   Menorrhagia    Obesity, Class III, BMI 40-49.9 (morbid obesity) ([place]) 11/11/2018   Sleep apnea    uses CPAP - setting 11   SVT (supraventricular tachycardia)     Review of Systems:    Review of Systems  Constitutional:  Negative for chills and fever.  Gastrointestinal:  Positive for abdominal pain and constipation. Negative for blood in stool, diarrhea, melena, nausea and vomiting.  Genitourinary:  Negative for dysuria and urgency.     Physical Exam: Vitals:   02/10/22 1451  BP: 136/80  Pulse: 86  Resp: 16  SpO2: 94%  Weight: (!) 341 lb 12.8 oz (155 kg)  Height: '5\' 2"'$  (1.575 m)     Physical Exam Constitutional:      Appearance: She is well-developed. She is obese. She is not ill-appearing.  Cardiovascular:     Rate and Rhythm: Normal rate and regular rhythm.  Pulmonary:     Breath sounds: Normal breath sounds. No wheezing.  Abdominal:     General: Abdomen is protuberant. Bowel sounds are decreased.     Palpations: Abdomen is soft.     Tenderness: There is generalized abdominal tenderness. There is no rebound.      Assessment & Plan:   Constipation Patient has been experienceing abdominal pain for 3 days. The pain is located across entire lower abdomen. She has not had a good bowel movement in a week. She had a small bowel movement 3 days ago. Her abdomen was hurting worse last night and hurts worse when trying to pass gas or have BM.  3 days, started hurting worse lastnight. She has a history of  constipation. Not currently on a bowel regimen. She had a dilation and curettage/hystercopy with minerva 8/1. She has not been on any pain medication recently. She had a few episodes of feeling nausea. She is taking PO, no nausea currently or vomiting.   Assessment/Plan: Acute on chronic constipation, gave recommendation to start Metamucil twice daily and stay hydrated. Prescribed osmotic laxative. Patient given precautions if not improving to contact office or go to ED if acutely worsens, she may be developing bowel obstruction.  Lorene Dy, MD

## 2022-02-10 NOTE — Patient Instructions (Addendum)
Thank you for trusting me with your care. To recap, today we discussed the following:   Constipation  - Start taking Metamucil twice daily. Continue this once you are having bowel movements to help with chronic constipation - Start taking Sorbitol daily, once you are having bowel movements you can stop taking this medication  If you are not improving please please contact office. If you worsen (worse abdominal pain nausea or vomiting). please seek emergent treatment.

## 2022-02-11 ENCOUNTER — Ambulatory Visit: Payer: 59 | Admitting: Family Medicine

## 2022-02-11 ENCOUNTER — Telehealth: Payer: Self-pay | Admitting: Family Medicine

## 2022-02-11 DIAGNOSIS — K5900 Constipation, unspecified: Secondary | ICD-10-CM | POA: Insufficient documentation

## 2022-02-11 NOTE — Telephone Encounter (Signed)
Pt called stating she worse since her visit yesterday. Stating she has been throwing up and unable to get any rest. States she is going to urgent care but wants a nurse to give her a call.

## 2022-02-11 NOTE — Assessment & Plan Note (Signed)
Patient has been experienceing abdominal pain for 3 days. The pain is located across entire lower abdomen. She has not had a good bowel movement in a week. She had a small bowel movement 3 days ago. Her abdomen was hurting worse last night and hurts worse when trying to pass gas or have BM.  3 days, started hurting worse lastnight. She has a history of constipation. Not currently on a bowel regimen. She had a dilation and curettage/hystercopy with minerva 8/1. She has not been on any pain medication recently. She had a few episodes of feeling nausea. She is taking PO, no nausea currently or vomiting.   Assessment/Plan: Acute on chronic constipation, gave recommendation to start Metamucil twice daily and stay hydrated. Prescribed osmotic laxative. Patient given precautions if not improving to contact office or go to ED if acutely worsens, she may be developing bowel obstruction.

## 2022-02-12 ENCOUNTER — Other Ambulatory Visit: Payer: Self-pay | Admitting: Family Medicine

## 2022-02-12 ENCOUNTER — Telehealth: Payer: Self-pay | Admitting: Family Medicine

## 2022-02-12 NOTE — Telephone Encounter (Signed)
Encouraged patient to take otc magnesium citrate  200 mL (11.6 grams) 1 time per day for constipation

## 2022-02-12 NOTE — Telephone Encounter (Signed)
Has been doing this already and has not been helping.

## 2022-02-12 NOTE — Telephone Encounter (Signed)
Pt reports constipation, hurts when she tries to go, is having abdominal pain, sx worsened since she was last here. Went to urgent care yesterday was given medication, she took medication has only helped to pass gas, please advice?

## 2022-02-12 NOTE — Telephone Encounter (Signed)
Pt returning call

## 2022-02-12 NOTE — Telephone Encounter (Signed)
Left vm

## 2022-02-15 ENCOUNTER — Emergency Department (HOSPITAL_COMMUNITY): Payer: 59

## 2022-02-15 ENCOUNTER — Emergency Department (HOSPITAL_COMMUNITY)
Admission: EM | Admit: 2022-02-15 | Discharge: 2022-02-16 | Disposition: A | Discharge status: Home / Self Care | Payer: 59 | Attending: Emergency Medicine | Admitting: Emergency Medicine

## 2022-02-15 ENCOUNTER — Encounter (HOSPITAL_COMMUNITY): Payer: Self-pay

## 2022-02-15 ENCOUNTER — Other Ambulatory Visit: Payer: Self-pay

## 2022-02-15 DIAGNOSIS — J45909 Unspecified asthma, uncomplicated: Secondary | ICD-10-CM | POA: Insufficient documentation

## 2022-02-15 DIAGNOSIS — N3001 Acute cystitis with hematuria: Secondary | ICD-10-CM | POA: Insufficient documentation

## 2022-02-15 DIAGNOSIS — Z7951 Long term (current) use of inhaled steroids: Secondary | ICD-10-CM | POA: Insufficient documentation

## 2022-02-15 DIAGNOSIS — R1084 Generalized abdominal pain: Secondary | ICD-10-CM | POA: Diagnosis not present

## 2022-02-15 DIAGNOSIS — Z794 Long term (current) use of insulin: Secondary | ICD-10-CM | POA: Insufficient documentation

## 2022-02-15 DIAGNOSIS — D649 Anemia, unspecified: Secondary | ICD-10-CM | POA: Diagnosis not present

## 2022-02-15 DIAGNOSIS — R1110 Vomiting, unspecified: Secondary | ICD-10-CM | POA: Diagnosis not present

## 2022-02-15 DIAGNOSIS — R109 Unspecified abdominal pain: Secondary | ICD-10-CM | POA: Diagnosis not present

## 2022-02-15 LAB — COMPREHENSIVE METABOLIC PANEL
ALT: 11 U/L (ref 0–44)
AST: 13 U/L — ABNORMAL LOW (ref 15–41)
Albumin: 3.4 g/dL — ABNORMAL LOW (ref 3.5–5.0)
Alkaline Phosphatase: 60 U/L (ref 38–126)
Anion gap: 9 (ref 5–15)
BUN: 9 mg/dL (ref 6–20)
CO2: 25 mmol/L (ref 22–32)
Calcium: 8.7 mg/dL — ABNORMAL LOW (ref 8.9–10.3)
Chloride: 103 mmol/L (ref 98–111)
Creatinine, Ser: 0.62 mg/dL (ref 0.44–1.00)
GFR, Estimated: >60 mL/min (ref >60)
Glucose, Bld: 75 mg/dL (ref 70–99)
Potassium: 3.9 mmol/L (ref 3.5–5.1)
Sodium: 137 mmol/L (ref 135–145)
Total Bilirubin: 0.6 mg/dL (ref 0.3–1.2)
Total Protein: 7.6 g/dL (ref 6.5–8.1)

## 2022-02-15 LAB — URINALYSIS, ROUTINE W REFLEX MICROSCOPIC
Bilirubin Urine: NEGATIVE
Glucose, UA: NEGATIVE mg/dL
Ketones, ur: 20 mg/dL — AB
Nitrite: NEGATIVE
Protein, ur: 30 mg/dL — AB
Specific Gravity, Urine: 1.019 (ref 1.005–1.030)
WBC, UA: >50 WBC/hpf — ABNORMAL HIGH (ref 0–5)
pH: 5 (ref 5.0–8.0)

## 2022-02-15 LAB — CBC
HCT: 34.7 % — ABNORMAL LOW (ref 36.0–46.0)
Hemoglobin: 11.1 g/dL — ABNORMAL LOW (ref 12.0–15.0)
MCH: 27.1 pg (ref 26.0–34.0)
MCHC: 32 g/dL (ref 30.0–36.0)
MCV: 84.8 fL (ref 80.0–100.0)
Platelets: 346 10*3/uL (ref 150–400)
RBC: 4.09 MIL/uL (ref 3.87–5.11)
RDW: 14.6 % (ref 11.5–15.5)
WBC: 6.6 10*3/uL (ref 4.0–10.5)
nRBC: 0 % (ref 0.0–0.2)

## 2022-02-15 LAB — PREGNANCY, URINE: Preg Test, Ur: NEGATIVE

## 2022-02-15 LAB — LIPASE, BLOOD: Lipase: 23 U/L (ref 11–51)

## 2022-02-15 MED ORDER — IOHEXOL 300 MG/ML  SOLN
100.0000 mL | Freq: Once | INTRAMUSCULAR | Status: AC | PRN
  Administered 2022-02-15: 100 mL via INTRAVENOUS

## 2022-02-15 MED ORDER — HYDROMORPHONE HCL 1 MG/ML IJ SOLN
1.0000 mg | Freq: Once | INTRAMUSCULAR | Status: AC
  Administered 2022-02-15: 1 mg via INTRAVENOUS
  Filled 2022-02-15: qty 1

## 2022-02-15 MED ORDER — CEPHALEXIN 500 MG PO CAPS: 500.0000 mg | ORAL_CAPSULE | Freq: Two times a day (BID) | ORAL | 0 refills | Status: AC

## 2022-02-15 MED ORDER — MORPHINE SULFATE (PF) 4 MG/ML IV SOLN
4.0000 mg | Freq: Once | INTRAVENOUS | Status: AC
  Administered 2022-02-15: 4 mg via INTRAVENOUS
  Filled 2022-02-15 (×2): qty 1

## 2022-02-15 MED ORDER — SODIUM CHLORIDE 0.9 % IV BOLUS
1000.0000 mL | Freq: Once | INTRAVENOUS | Status: AC
  Administered 2022-02-15: 1000 mL via INTRAVENOUS

## 2022-02-15 MED ORDER — SODIUM CHLORIDE 0.9 % IV SOLN
1.0000 g | Freq: Once | INTRAVENOUS | Status: AC
  Administered 2022-02-15: 1 g via INTRAVENOUS
  Filled 2022-02-15: qty 10

## 2022-02-15 NOTE — ED Triage Notes (Signed)
Pt presents to ED with complaints of left sided abdominal pain x 1 week. Pt states she went to UC and was told she was constipated. Pt has vomited x 2 in last 24 hours.

## 2022-02-15 NOTE — ED Provider Notes (Signed)
[facility] EMERGENCY DEPARTMENT Provider Note   CSN: [id number] Arrival date & time: 02/15/22  1415     History {Add pertinent medical, surgical, social history, OB history to HPI:1} Chief Complaint  Patient presents with  . Abdominal Pain    Jessica Sanchez is a 36 y.o. female.  Patient with history of morbid obesity, OSA, asthma, GERD, and constipation presents today with complaints of abdominal pain.  She states that same has been worsening over the past week.  During this time she has not had a full bowel movement.  States that she went to urgent care on 10/11 for same and was diagnosed with constipation.  She was placed on a bowel regimen which she has tried with 1 small bout of diarrhea but no bowel movement since then. States that her pain has since intensified and she has been vomiting over the past 2 days. States that her pain is left sided in nature. Denies any history of similar symptoms or abdominal surgeries.  The history is provided by the patient. No language interpreter was used.  Abdominal Pain      Home Medications Prior to Admission medications   Medication Sig Start Date End Date Taking? Authorizing Provider  albuterol (VENTOLIN HFA) 108 (90 Base) MCG/ACT inhaler Inhale 2 puffs into the lungs every 6 (six) hours as needed for wheezing or shortness of breath. 10/26/21   [name], FNP  famotidine (PEPCID) 40 MG tablet Take 1 tablet (40 mg total) by mouth 2 (two) times daily. 02/03/22 03/05/22  Dara Hoyer, FNP  flecainide (TAMBOCOR) 100 MG tablet Take 150 mg by mouth 2 (two) times daily.    [provider]  fluticasone furoate-vilanterol (BREO ELLIPTA) 100-25 MCG/ACT AEPB Inhale 1 puff into the lungs daily. 01/26/22 07/25/22  [name], FNP  furosemide (LASIX) 20 MG tablet Take 1 tablet (20 mg total) by mouth daily. 11/30/21   [name], FNP  levalbuterol [facility] HFA) 45 MCG/ACT inhaler Inhale 2 puffs into the lungs every 6 (six) hours as  needed for wheezing. 02/03/22   Ambs, Kathrine Cords, FNP  levocetirizine (XYZAL) 5 MG tablet Take 1 tablet (5 mg total) by mouth in the morning and at bedtime. 02/03/22 03/05/22  Dara Hoyer, FNP  montelukast (SINGULAIR) 10 MG tablet Take 1 tablet (10 mg total) by mouth at bedtime. 02/03/22   Dara Hoyer, FNP  Multiple Vitamins-Calcium (ONE-A-DAY WOMENS PO) Take 1 tablet by mouth daily.    [provider]  potassium chloride SA (KLOR-CON M) 20 MEQ tablet Take 1 tablet (20 mEq total) by mouth 2 (two) times daily. 11/18/21   Fayrene Helper, MD  Semaglutide-Weight Management (WEGOVY) 0.25 MG/0.5ML SOAJ Inject 0.25 mg into the skin once a week for 28 days. Patient not taking: Reported on 02/10/2022 01/29/22 02/26/22  [name], FNP  sorbitol 70 % solution Take 15 mLs by mouth daily as needed. 02/10/22   Lyndal Pulley, MD  Vitamin D, Ergocalciferol, (DRISDOL) 1.25 MG (50000 UNIT) CAPS capsule Take 1 capsule (50,000 Units total) by mouth every 7 (seven) days. 01/26/22   [name], FNP  apixaban (ELIQUIS) 5 MG TABS tablet Take 2 tablets ('10mg'$ ) twice daily for 7 days, then 1 tablet ('5mg'$ ) twice daily 04/20/19 04/20/19  Rodell Perna A, PA-C  levonorgestrel (MIRENA) 20 MCG/24HR IUD 1 each by Intrauterine route once.   06/25/20  [provider]      Allergies    Shellfish allergy, Sulfa antibiotics, and Adhesive [tape]  Review of Systems   Review of Systems  Gastrointestinal:  Positive for abdominal pain.  All other systems reviewed and are negative.   Physical Exam Updated Vital Signs BP (!) 157/88 (BP Location: Right Wrist)   Pulse 87   Temp 97.8 F (36.6 C) (Oral)   Resp 20   Ht '5\' 2"'$  (1.575 m)   Wt (!) 152.9 kg   SpO2 99%   BMI 61.64 kg/m  Physical Exam Vitals and nursing note reviewed.  Constitutional:      General: She is not in acute distress.    Appearance: Normal appearance. She is normal weight. She is not ill-appearing, toxic-appearing or diaphoretic.   HENT:     Head: Normocephalic and atraumatic.  Cardiovascular:     Rate and Rhythm: Normal rate.  Pulmonary:     Effort: Pulmonary effort is normal. No respiratory distress.  Abdominal:     General: Abdomen is flat.     Palpations: Abdomen is soft.     Tenderness: There is abdominal tenderness in the left upper quadrant and left lower quadrant.  Musculoskeletal:        General: Normal range of motion.     Cervical back: Normal range of motion.  Skin:    General: Skin is warm and dry.  Neurological:     General: No focal deficit present.     Mental Status: She is alert.  Psychiatric:        Mood and Affect: Mood normal.        Behavior: Behavior normal.    ED Results / Procedures / Treatments   Labs (all labs ordered are listed, but only abnormal results are displayed) Labs Reviewed  CBC - Abnormal; Notable for the following components:      Result Value   Hemoglobin 11.1 (*)    HCT 34.7 (*)    All other components within normal limits  LIPASE, BLOOD  COMPREHENSIVE METABOLIC PANEL  URINALYSIS, ROUTINE W REFLEX MICROSCOPIC    EKG None  Radiology No results found.  Procedures Procedures  {Document cardiac monitor, telemetry assessment procedure when appropriate:1}  Medications Ordered in ED Medications  sodium chloride 0.9 % bolus 1,000 mL (has no administration in time range)  morphine (PF) 4 MG/ML injection 4 mg (has no administration in time range)    ED Course/ Medical Decision Making/ A&P                           Medical Decision Making Amount and/or Complexity of Data Reviewed Radiology: ordered.  Risk Prescription drug management.   ***  {Document critical care time when appropriate:1} {Document review of labs and clinical decision tools ie heart score, Chads2Vasc2 etc:1}  {Document your independent review of radiology images, and any outside records:1} {Document your discussion with family members, caretakers, and with  consultants:1} {Document social determinants of health affecting pt's care:1} {Document your decision making why or why not admission, treatments were needed:1} Final Clinical Impression(s) / ED Diagnoses Final diagnoses:  None    Rx / DC Orders ED Discharge Orders     None

## 2022-02-15 NOTE — ED Provider Notes (Incomplete)
[facility] EMERGENCY DEPARTMENT Provider Note   CSN: [id number] Arrival date & time: 02/15/22  1415     History {Add pertinent medical, surgical, social history, OB history to HPI:1} Chief Complaint  Patient presents with  . Abdominal Pain    Jessica Sanchez is a 36 y.o. female.  Patient with history of morbid obesity, OSA, asthma, GERD, and constipation presents today with complaints of abdominal pain.  She states that same has been worsening over the past week.  During this time she has not had a full bowel movement.  States that she went to urgent care on 10/11 for same and was diagnosed with constipation.  She was placed on a bowel regimen which she has tried with a few bouts of diarrhea but no resolution in pain. States that her pain has since intensified and she has been vomiting over the past 2 days. States that her pain is left sided in nature. Denies any history of similar symptoms or abdominal surgeries. Denies any hematochezia or melena. Endorses some dysuria but no hematuria.  The history is provided by the patient. No language interpreter was used.  Abdominal Pain Associated symptoms: dysuria, nausea and vomiting        Home Medications Prior to Admission medications   Medication Sig Start Date End Date Taking? Authorizing Provider  albuterol (VENTOLIN HFA) 108 (90 Base) MCG/ACT inhaler Inhale 2 puffs into the lungs every 6 (six) hours as needed for wheezing or shortness of breath. 10/26/21   [name], FNP  famotidine (PEPCID) 40 MG tablet Take 1 tablet (40 mg total) by mouth 2 (two) times daily. 02/03/22 03/05/22  Dara Hoyer, FNP  flecainide (TAMBOCOR) 100 MG tablet Take 150 mg by mouth 2 (two) times daily.    [provider]  fluticasone furoate-vilanterol (BREO ELLIPTA) 100-25 MCG/ACT AEPB Inhale 1 puff into the lungs daily. 01/26/22 07/25/22  [name], FNP  furosemide (LASIX) 20 MG tablet Take 1 tablet (20 mg total) by mouth daily. 11/30/21    [name], FNP  levalbuterol [facility] HFA) 45 MCG/ACT inhaler Inhale 2 puffs into the lungs every 6 (six) hours as needed for wheezing. 02/03/22   Ambs, Kathrine Cords, FNP  levocetirizine (XYZAL) 5 MG tablet Take 1 tablet (5 mg total) by mouth in the morning and at bedtime. 02/03/22 03/05/22  Dara Hoyer, FNP  montelukast (SINGULAIR) 10 MG tablet Take 1 tablet (10 mg total) by mouth at bedtime. 02/03/22   Dara Hoyer, FNP  Multiple Vitamins-Calcium (ONE-A-DAY WOMENS PO) Take 1 tablet by mouth daily.    [provider]  potassium chloride SA (KLOR-CON M) 20 MEQ tablet Take 1 tablet (20 mEq total) by mouth 2 (two) times daily. 11/18/21   Fayrene Helper, MD  Semaglutide-Weight Management (WEGOVY) 0.25 MG/0.5ML SOAJ Inject 0.25 mg into the skin once a week for 28 days. Patient not taking: Reported on 02/10/2022 01/29/22 02/26/22  [name], FNP  sorbitol 70 % solution Take 15 mLs by mouth daily as needed. 02/10/22   Lyndal Pulley, MD  Vitamin D, Ergocalciferol, (DRISDOL) 1.25 MG (50000 UNIT) CAPS capsule Take 1 capsule (50,000 Units total) by mouth every 7 (seven) days. 01/26/22   [name], FNP  apixaban (ELIQUIS) 5 MG TABS tablet Take 2 tablets ('10mg'$ ) twice daily for 7 days, then 1 tablet ('5mg'$ ) twice daily 04/20/19 04/20/19  Rodell Perna A, PA-C  levonorgestrel (MIRENA) 20 MCG/24HR IUD 1 each by Intrauterine route once.   06/25/20  [provider]      Allergies    Shellfish allergy, Sulfa antibiotics, and Adhesive [tape]    Review of Systems   Review of Systems  Gastrointestinal:  Positive for abdominal pain, nausea and vomiting.  Genitourinary:  Positive for dysuria. Negative for flank pain.  All other systems reviewed and are negative.   Physical Exam Updated Vital Signs BP (!) 157/88 (BP Location: Right Wrist)   Pulse 87   Temp 97.8 F (36.6 C) (Oral)   Resp 20   Ht '5\' 2"'$  (1.575 m)   Wt (!) 152.9 kg   SpO2 99%   BMI 61.64 kg/m  Physical Exam Vitals  and nursing note reviewed.  Constitutional:      General: She is not in acute distress.    Appearance: Normal appearance. She is normal weight. She is not ill-appearing, toxic-appearing or diaphoretic.  HENT:     Head: Normocephalic and atraumatic.  Cardiovascular:     Rate and Rhythm: Normal rate.  Pulmonary:     Effort: Pulmonary effort is normal. No respiratory distress.  Abdominal:     General: Abdomen is flat.     Palpations: Abdomen is soft.     Tenderness: There is abdominal tenderness in the left upper quadrant and left lower quadrant. There is no right CVA tenderness or left CVA tenderness.  Musculoskeletal:        General: Normal range of motion.     Cervical back: Normal range of motion.  Skin:    General: Skin is warm and dry.  Neurological:     General: No focal deficit present.     Mental Status: She is alert.  Psychiatric:        Mood and Affect: Mood normal.        Behavior: Behavior normal.     ED Results / Procedures / Treatments   Labs (all labs ordered are listed, but only abnormal results are displayed) Labs Reviewed  COMPREHENSIVE METABOLIC PANEL - Abnormal; Notable for the following components:      Result Value   Calcium 8.7 (*)    Albumin 3.4 (*)    AST 13 (*)    All other components within normal limits  CBC - Abnormal; Notable for the following components:   Hemoglobin 11.1 (*)    HCT 34.7 (*)    All other components within normal limits  URINALYSIS, ROUTINE W REFLEX MICROSCOPIC - Abnormal; Notable for the following components:   APPearance HAZY (*)    Hgb urine dipstick LARGE (*)    Ketones, ur 20 (*)    Protein, ur 30 (*)    Leukocytes,Ua MODERATE (*)    WBC, UA >50 (*)    Bacteria, UA FEW (*)    All other components within normal limits  LIPASE, BLOOD    EKG None  Radiology No results found.  Procedures Procedures  {Document cardiac monitor, telemetry assessment procedure when appropriate:1}  Medications Ordered in  ED Medications  sodium chloride 0.9 % bolus 1,000 mL (0 mLs Intravenous Stopped 02/15/22 2147)  morphine (PF) 4 MG/ML injection 4 mg (4 mg Intravenous Given 02/15/22 1914)  iohexol (OMNIPAQUE) 300 MG/ML solution 100 mL (100 mLs Intravenous Contrast Given 02/15/22 1936)  HYDROmorphone (DILAUDID) injection 1 mg (1 mg Intravenous Given 02/15/22 2051)  cefTRIAXone (ROCEPHIN) 1 g in sodium chloride 0.9 % 100 mL IVPB (1 g Intravenous New Bag/Given 02/15/22 2133)    ED Course/ Medical Decision Making/ A&P  Medical Decision Making Amount and/or Complexity of Data Reviewed Labs: ordered. Radiology: ordered.  Risk Prescription drug management.   This patient presents to the ED for concern of abdominal pain, this involves an extensive number of treatment options, and is a complaint that carries with it a high risk of complications and morbidity.   Co morbidities that complicate the patient evaluation  Hx constipation   Additional history obtained:  Additional history obtained from epic chart review   Lab Tests:  I Ordered, and personally interpreted labs.  The pertinent results include:  hemoglobin 11.1. UA with large hgb, ketonuria, proteinuria, moderate leukocytes, rbcs, >50 WBCs, few bacteria. Upreg negative   Imaging Studies ordered:  I ordered imaging studies including CT abdomen pelvis I independently visualized and interpreted imaging which showed uterine fibroids, Subtle high-density material layering within the gallbladder could reflect small stones, sludge or contrast.  No other acute findings I agree with the radiologist interpretation   Problem List / ED Course / Critical interventions / Medication management  I ordered medication including morphine, dilaudid,  for pain, fluids for dehydration, rocephin for UTI  Reevaluation of the patient after these medicines showed that the patient improved I have reviewed the patients home medicines and  have made adjustments as needed   Test / Admission - Considered:  Patient presents today with complaints of left sided abdominal pain x 1 week. Patient is nontoxic, nonseptic appearing, in no apparent distress.  Patient's pain and other symptoms adequately managed in emergency department.  Fluid bolus given.  Labs, imaging and vitals reviewed.  Patient does not meet the SIRS or Sepsis criteria.  On repeat exam patient does not have a surgical abdomin and there are no peritoneal signs.  No indication of appendicitis, bowel obstruction, bowel perforation, cholecystitis, diverticulitis, PID or ectopic pregnancy. She did have signs of UTI and was given antib  Patient discharged home with symptomatic treatment and given strict instructions for follow-up with their primary care physician.  I have also discussed reasons to return immediately to the ER.  Patient expresses understanding and agrees with plan.     {Document critical care time when appropriate:1} {Document review of labs and clinical decision tools ie heart score, Chads2Vasc2 etc:1}  {Document your independent review of radiology images, and any outside records:1} {Document your discussion with family members, caretakers, and with consultants:1} {Document social determinants of health affecting pt's care:1} {Document your decision making why or why not admission, treatments were needed:1} Final Clinical Impression(s) / ED Diagnoses Final diagnoses:  None    Rx / DC Orders ED Discharge Orders     None

## 2022-02-15 NOTE — Discharge Instructions (Signed)
As we discussed, your urine did show signs of infection.  I have given you the first dose of antibiotics here to help with your symptoms.  Have also written you a prescription for an antibiotic for you to take as prescribed in its entirety for management of your symptoms.  Follow-up with your primary care doctor in the next few days for continued evaluation and management of your symptoms.  Return for development of any new or worsening symptoms.

## 2022-02-17 NOTE — Telephone Encounter (Signed)
Please encourage the patient to schedule a tele visit if her symptoms persist

## 2022-02-22 NOTE — Progress Notes (Deleted)
PCP:  [name], [place] Primary Cardiologist: None Electrophysiologist: [name], MD   Jessica Sanchez is a 36 y.o. female seen today for [name], MD for post hospital follow up.    Seen in ED 9/13 for palpitations/SVT in 140s by her apple watch. Work up in ED was unremarkable.  CTA without PE, focal pleural thickening between ribs 7-8 noted, felt to be chronic scarring vs atelectasis given lack of infectious symptoms.   Since discharge from hospital the patient reports doing ***.  she denies chest pain, palpitations, dyspnea, PND, orthopnea, nausea, vomiting, dizziness, syncope, edema, weight gain, or early satiety.   Past Medical History:  Diagnosis Date   Angio-edema    Arthritis    right shoulder   Asthma    Dyspnea    Dysrhythmia    hx of SVT   Fibroid (bleeding) (uterine)    Fibroid, uterine    H/O metrorrhagia    Hypertension    IUD (intrauterine device) in place 07/11/2015   Menorrhagia    Obesity, Class III, BMI 40-49.9 (morbid obesity) ([name]) 11/11/2018   Sleep apnea    uses CPAP - setting 11   SVT (supraventricular tachycardia)    Past Surgical History:  Procedure Laterality Date   DILATATION AND CURETTAGE/HYSTEROSCOPY WITH MINERVA N/A 12/01/2021   Procedure: DILATATION AND CURETTAGE/HYSTEROSCOPY WITH MINERVA;  Surgeon: [name], MD;  Location: [facility];  Service: Gynecology;  Laterality: N/A;   HYSTEROSCOPY N/A 09/17/2020   Procedure: HYSTEROSCOPY;  Surgeon: [name], MD;  Location: [facility];  Service: Gynecology;  Laterality: N/A;   HYSTEROSCOPY WITH D & C N/A 09/27/2013   Procedure: DILATATION AND CURETTAGE /HYSTEROSCOPY and insertion of Mirena IUD ;  Surgeon: [name]. Harrington Challenger, MD;  Location: [place] [facility];  Service: Gynecology;  Laterality: N/A;   IUD REMOVAL N/A 09/17/2020   Procedure: INTRAUTERINE DEVICE (IUD) REMOVAL (2);  Surgeon: [name], MD;  Location: [facility];  Service: Gynecology;  Laterality: N/A;   MYOMECTOMY  02/03/2011    Procedure: MYOMECTOMY;  Surgeon: [name], MD;  Location: [facility];  Service: Gynecology;  Laterality: N/A;   SVT ABLATION N/A 11/08/2018   Procedure: SVT ABLATION;  Surgeon: [name], MD;  Location: [place] [facility];  Service: Cardiovascular;  Laterality: N/A;   SVT ABLATION N/A 12/26/2020   Procedure: SVT ABLATION;  Surgeon: [name], MD;  Location: [place] [facility];  Service: Cardiovascular;  Laterality: N/A;   TONSILLECTOMY     TYMPANOSTOMY TUBE PLACEMENT      Current Outpatient Medications  Medication Sig Dispense Refill   albuterol (VENTOLIN HFA) 108 (90 Base) MCG/ACT inhaler Inhale 2 puffs into the lungs every 6 (six) hours as needed for wheezing or shortness of breath. 8 g 0   cephALEXin (KEFLEX) 500 MG capsule Take 1 capsule (500 mg total) by mouth 2 (two) times daily for 7 days. 14 capsule 0   famotidine (PEPCID) 40 MG tablet Take 1 tablet (40 mg total) by mouth 2 (two) times daily. 60 tablet 5   flecainide (TAMBOCOR) 100 MG tablet Take 150 mg by mouth 2 (two) times daily.     fluticasone furoate-vilanterol (BREO ELLIPTA) 100-25 MCG/ACT AEPB Inhale 1 puff into the lungs daily. 30 each 5   furosemide (LASIX) 20 MG tablet Take 1 tablet (20 mg total) by mouth daily. 30 tablet 0   levalbuterol (XOPENEX HFA) 45 MCG/ACT inhaler Inhale 2 puffs into the lungs every 6 (six) hours  as needed for wheezing. 1 each 3   levocetirizine (XYZAL) 5 MG tablet Take 1 tablet (5 mg total) by mouth in the morning and at bedtime. 60 tablet 0   montelukast (SINGULAIR) 10 MG tablet Take 1 tablet (10 mg total) by mouth at bedtime. 30 tablet 5   Multiple Vitamins-Calcium (ONE-A-DAY WOMENS PO) Take 1 tablet by mouth daily.     potassium chloride SA (KLOR-CON M) 20 MEQ tablet Take 1 tablet (20 mEq total) by mouth 2 (two) times daily. 40 tablet 0   Semaglutide-Weight Management (WEGOVY) 0.25 MG/0.5ML SOAJ Inject 0.25 mg into the skin once a week for 28 days. (Patient not taking: Reported on  02/10/2022) 2 mL 0   sorbitol 70 % solution Take 15 mLs by mouth daily as needed. 473 mL 0   Vitamin D, Ergocalciferol, (DRISDOL) 1.25 MG (50000 UNIT) CAPS capsule Take 1 capsule (50,000 Units total) by mouth every 7 (seven) days. 5 capsule 2   No current facility-administered medications for this visit.    Allergies  Allergen Reactions   Shellfish Allergy Shortness Of Breath, Itching and Swelling    Mouth became swollen, throat started itching, and developed shortness of breath   Sulfa Antibiotics Itching, Shortness Of Breath and Swelling    Mouth became swollen, throat started itching, and developed shortness of breath   Adhesive [Tape] Itching    Depends on the type of medical tape    Social History   Socioeconomic History   Marital status: Single    Spouse name: Not on file   Number of children: Not on file   Years of education: Not on file   Highest education level: Not on file  Occupational History   Not on file  Tobacco Use   Smoking status: Never   Smokeless tobacco: Never   Tobacco comments:    socially  Vaping Use   Vaping Use: Never used  Substance and Sexual Activity   Alcohol use: Yes    Comment: occasional   Drug use: Yes    Types: Marijuana    Comment: on occ   Sexual activity: Not Currently    Birth control/protection: None, Condom  Other Topics Concern   Not on file  Social History Narrative   Not on file   Social Determinants of Health   Financial Resource Strain: Not on file  Food Insecurity: Not on file  Transportation Needs: Not on file  Physical Activity: Not on file  Stress: Not on file  Social Connections: Not on file  Intimate Partner Violence: Not on file     Review of Systems: All other systems reviewed and are otherwise negative except as noted above.  Physical Exam: There were no vitals filed for this visit.  GEN- The patient is well appearing, alert and oriented x 3 today.   HEENT: normocephalic, atraumatic; sclera clear,  conjunctiva pink; hearing intact; oropharynx clear; neck supple, no JVP Lymph- no cervical lymphadenopathy Lungs- Clear to ausculation bilaterally, normal work of breathing.  No wheezes, rales, rhonchi Heart- {Blank single:19197::"Regular","Irregularly irregular"} rate and rhythm, no murmurs, rubs or gallops, PMI not laterally displaced GI- soft, non-tender, non-distended, bowel sounds present, no hepatosplenomegaly Extremities- {EDEMA ZDGLO:75643} peripheral edema. no clubbing or cyanosis; DP/PT/radial pulses 2+ bilaterally MS- no significant deformity or atrophy Skin- warm and dry, no rash or lesion Psych- euthymic mood, full affect Neuro- strength and sensation are intact  EKG is ordered. Personal review of EKG from today shows ***  Additional studies reviewed include: Previous  EP office notes.   Assessment and Plan:  1. SVT S/p ablation of a left sided AVNRT 12/2020 EKG today shows *** Continue flecainide 150 mg BID  2. Obesity There is no height or weight on file to calculate BMI.  Encouraged lifestyle modification   3. HTN Stable on current regimen   4. OSA  Encouraged nightly CPAP   Follow up with {EPMDS:28135} in {EPFOLLOW UP:28173}  Shirley Friar, PA-C  02/22/22 11:36 AM

## 2022-02-23 ENCOUNTER — Encounter: Payer: Self-pay | Admitting: Family Medicine

## 2022-02-23 ENCOUNTER — Ambulatory Visit (INDEPENDENT_AMBULATORY_CARE_PROVIDER_SITE_OTHER): Payer: 59 | Admitting: Family Medicine

## 2022-02-23 DIAGNOSIS — R935 Abnormal findings on diagnostic imaging of other abdominal regions, including retroperitoneum: Secondary | ICD-10-CM | POA: Diagnosis not present

## 2022-02-23 DIAGNOSIS — R1011 Right upper quadrant pain: Secondary | ICD-10-CM | POA: Diagnosis not present

## 2022-02-23 MED ORDER — ONDANSETRON HCL 4 MG PO TABS: 4.0000 mg | ORAL_TABLET | Freq: Three times a day (TID) | ORAL | 0 refills | Status: DC | PRN

## 2022-02-23 NOTE — Patient Instructions (Signed)
F/U with PCP as before, call if you need to be seen sooner  We are arranging for [place] of your gallbladder as soon as possible, hopefully tomorrow,  as your recent scan suggests that you may have gallstones, and you continue to experience significant pain  We hope to get [place] tomorrow.if your pain worsens , then you need to go to the ED  I have prescribed zofran in limited supply for nausea  Thanks for choosing [facility], we consider it a privelige to serve you.

## 2022-02-23 NOTE — Progress Notes (Unsigned)
   Jessica Sanchez     MRN: [id number]      DOB: July 17, 1985   HPI Jessica Sanchez is here with 2 week h/o stomach pain, urgenyt care dx constipation, and was sent to ED from UC and was treated for uTI Has appt in am with PCP but stattes pain so severe needs to be seen today Pain is worsened over past week, RLQ paion now a 10, having BM yesterday and today, vomited yesterday, slight nausea today, no fever , chills , burning with urination, c/o pain with loose BM, just in RLQ 1 week h/o right chest pain with breathing ROS Denies recent fever or chills. Denies sinus pressure, nasal congestion, ear pain or sore throat. Denies chest congestion, productive cough or wheezing. Denies chest pains, palpitations and leg swelling Denies abdominal pain, nausea, vomiting,diarrhea or constipation.   Denies dysuria, frequency, hesitancy or incontinence. Denies joint pain, swelling and limitation in mobility. Denies headaches, seizures, numbness, or tingling. Denies depression, anxiety or insomnia. Denies skin break down or rash.   PE  BP 128/85 (BP Location: Right Arm, Patient Position: Sitting, Cuff Size: Large)   Pulse 90   Ht '5\' 2"'$  (1.575 m)   Wt (!) 339 lb 1.9 oz (153.8 kg)   SpO2 96%   BMI 62.03 kg/m   Patient alert and oriented and in no cardiopulmonary distress.  HEENT: No facial asymmetry, EOMI,     Neck supple .  Chest: Clear to auscultation bilaterally.  CVS: S1, S2 no murmurs, no S3.Regular rate.  ABD: Soft non tender.   Ext: No edema  MS: Adequate ROM spine, shoulders, hips and knees.  Skin: Intact, no ulcerations or rash noted.  Psych: Good eye contact, normal affect. Memory intact not anxious or depressed appearing.  CNS: CN 2-12 intact, power,  normal throughout.no focal deficits noted.   Assessment & Plan  ***

## 2022-02-24 ENCOUNTER — Ambulatory Visit: Payer: 59 | Admitting: Family Medicine

## 2022-02-24 ENCOUNTER — Emergency Department (HOSPITAL_COMMUNITY)
Admission: EM | Admit: 2022-02-24 | Discharge: 2022-02-24 | Disposition: A | Discharge status: Home / Self Care | Payer: 59 | Attending: Emergency Medicine | Admitting: Emergency Medicine

## 2022-02-24 ENCOUNTER — Encounter: Payer: Self-pay | Admitting: Family Medicine

## 2022-02-24 ENCOUNTER — Emergency Department (HOSPITAL_COMMUNITY): Payer: 59

## 2022-02-24 ENCOUNTER — Other Ambulatory Visit: Payer: Self-pay

## 2022-02-24 ENCOUNTER — Encounter (HOSPITAL_COMMUNITY): Payer: Self-pay

## 2022-02-24 ENCOUNTER — Telehealth: Payer: Self-pay | Admitting: Family Medicine

## 2022-02-24 ENCOUNTER — Telehealth: Payer: Self-pay

## 2022-02-24 ENCOUNTER — Ambulatory Visit: Payer: 59 | Admitting: Student

## 2022-02-24 DIAGNOSIS — R1011 Right upper quadrant pain: Secondary | ICD-10-CM | POA: Diagnosis not present

## 2022-02-24 DIAGNOSIS — Z7951 Long term (current) use of inhaled steroids: Secondary | ICD-10-CM | POA: Diagnosis not present

## 2022-02-24 DIAGNOSIS — R112 Nausea with vomiting, unspecified: Secondary | ICD-10-CM | POA: Diagnosis not present

## 2022-02-24 DIAGNOSIS — D72829 Elevated white blood cell count, unspecified: Secondary | ICD-10-CM | POA: Insufficient documentation

## 2022-02-24 DIAGNOSIS — R1031 Right lower quadrant pain: Secondary | ICD-10-CM | POA: Diagnosis not present

## 2022-02-24 DIAGNOSIS — I10 Essential (primary) hypertension: Secondary | ICD-10-CM | POA: Insufficient documentation

## 2022-02-24 DIAGNOSIS — Z7901 Long term (current) use of anticoagulants: Secondary | ICD-10-CM | POA: Insufficient documentation

## 2022-02-24 DIAGNOSIS — J45909 Unspecified asthma, uncomplicated: Secondary | ICD-10-CM | POA: Insufficient documentation

## 2022-02-24 LAB — CBC WITH DIFFERENTIAL/PLATELET
Abs Immature Granulocytes: 0.02 10*3/uL (ref 0.00–0.07)
Basophils Absolute: 0 10*3/uL (ref 0.0–0.1)
Basophils Relative: 0 %
Eosinophils Absolute: 0.2 10*3/uL (ref 0.0–0.5)
Eosinophils Relative: 4 %
HCT: 35.3 % — ABNORMAL LOW (ref 36.0–46.0)
Hemoglobin: 11 g/dL — ABNORMAL LOW (ref 12.0–15.0)
Immature Granulocytes: 0 %
Lymphocytes Relative: 37 %
Lymphs Abs: 2 10*3/uL (ref 0.7–4.0)
MCH: 26.6 pg (ref 26.0–34.0)
MCHC: 31.2 g/dL (ref 30.0–36.0)
MCV: 85.3 fL (ref 80.0–100.0)
Monocytes Absolute: 0.5 10*3/uL (ref 0.1–1.0)
Monocytes Relative: 9 %
Neutro Abs: 2.8 10*3/uL (ref 1.7–7.7)
Neutrophils Relative %: 50 %
Platelets: 301 10*3/uL (ref 150–400)
RBC: 4.14 MIL/uL (ref 3.87–5.11)
RDW: 14.8 % (ref 11.5–15.5)
WBC: 5.5 10*3/uL (ref 4.0–10.5)
nRBC: 0 % (ref 0.0–0.2)

## 2022-02-24 LAB — URINALYSIS, ROUTINE W REFLEX MICROSCOPIC
Bilirubin Urine: NEGATIVE
Glucose, UA: NEGATIVE mg/dL
Ketones, ur: NEGATIVE mg/dL
Nitrite: NEGATIVE
Protein, ur: NEGATIVE mg/dL
Specific Gravity, Urine: 1.014 (ref 1.005–1.030)
WBC, UA: >50 WBC/hpf — ABNORMAL HIGH (ref 0–5)
pH: 6 (ref 5.0–8.0)

## 2022-02-24 LAB — COMPREHENSIVE METABOLIC PANEL
ALT: 13 U/L (ref 0–44)
AST: 16 U/L (ref 15–41)
Albumin: 3.3 g/dL — ABNORMAL LOW (ref 3.5–5.0)
Alkaline Phosphatase: 52 U/L (ref 38–126)
Anion gap: 7 (ref 5–15)
BUN: 9 mg/dL (ref 6–20)
CO2: 25 mmol/L (ref 22–32)
Calcium: 8.8 mg/dL — ABNORMAL LOW (ref 8.9–10.3)
Chloride: 104 mmol/L (ref 98–111)
Creatinine, Ser: 0.65 mg/dL (ref 0.44–1.00)
GFR, Estimated: >60 mL/min (ref >60)
Glucose, Bld: 80 mg/dL (ref 70–99)
Potassium: 4.4 mmol/L (ref 3.5–5.1)
Sodium: 136 mmol/L (ref 135–145)
Total Bilirubin: 0.3 mg/dL (ref 0.3–1.2)
Total Protein: 7.2 g/dL (ref 6.5–8.1)

## 2022-02-24 LAB — POC URINE PREG, ED: Preg Test, Ur: NEGATIVE

## 2022-02-24 LAB — LIPASE, BLOOD: Lipase: 23 U/L (ref 11–51)

## 2022-02-24 MED ORDER — DICYCLOMINE HCL 20 MG PO TABS: 20.0000 mg | ORAL_TABLET | Freq: Two times a day (BID) | ORAL | 0 refills | Status: DC

## 2022-02-24 MED ORDER — DICYCLOMINE HCL 10 MG PO CAPS
10.0000 mg | ORAL_CAPSULE | Freq: Once | ORAL | Status: AC
  Administered 2022-02-24: 10 mg via ORAL
  Filled 2022-02-24: qty 1

## 2022-02-24 MED ORDER — HYDROCODONE-ACETAMINOPHEN 5-325 MG PO TABS: 1.0000 | ORAL_TABLET | Freq: Once | ORAL | Status: DC

## 2022-02-24 NOTE — Discharge Instructions (Signed)
Today's lab tests and ultrasound are negative for an acute problem with your gallbladder.  However you may need an additional test called a HIDA scan to assess your gallbladder function.  If it is spasming it can mimic an infected gallbladder and give you similar symptoms.  This is a test that can be ordered by your primary doctor as we cannot give this to the emergency department.  Continue to taking your home medications including the nausea medicine you were prescribed yesterday.

## 2022-02-24 NOTE — ED Notes (Addendum)
Attempted to start IV X2 with no success.

## 2022-02-24 NOTE — Assessment & Plan Note (Signed)
2 week h/o abdominal pain , primarily rUQ, abn abdominal scan, suggestive of gallstones, urgent US and zofran for nausea and  vomiting as needed

## 2022-02-24 NOTE — Telephone Encounter (Signed)
Pt was seen in er today. Scheduled for 10/30

## 2022-02-24 NOTE — Telephone Encounter (Signed)
Patient was seen 02/23/22 in office for stomach pain, patient called this morning experiencing stomach pain and throwing up I advised patient to go to ER to be checked patient agreed.

## 2022-02-24 NOTE — ED Provider Notes (Signed)
[facility] EMERGENCY DEPARTMENT Provider Note   CSN: [id number] Arrival date & time: 02/24/22  1042     History  Chief Complaint  Patient presents with   Abdominal Pain   Nausea   Emesis    Jessica Sanchez is a 36 y.o. female with a prior history of SVT, GERD, hypertension and history of asthma presenting with a several week history of right upper quadrant pain, at times radiating into her right scapula.  She denies fevers or chills, she has had some intermittent nausea with an episode of vomiting yesterday and again this morning.  She was seen by her primary provider yesterday who was concerned for possible gallbladder disease given she had a CT scan here on October 16 revealing subtle sludge versus gallstones.  An outpatient ultrasound has been ordered, however patient's pain intensified this morning so she decided to present here.  She last ate yesterday evening, she does report pain worsens with meals.  She had an episode of diarrhea yesterday, none today.  She does have a history of chronic abdominal pain of unclear etiology which generally tends to be left-sided, although she does have known fibroids, no other intra-abdominal abnormalities per prior imaging.  She has found no alleviators for her symptoms although was prescribed Zofran yesterday which has helped with the nausea. The history is provided by the patient.       Home Medications Prior to Admission medications   Medication Sig Start Date End Date Taking? Authorizing Provider  dicyclomine (BENTYL) 20 MG tablet Take 1 tablet (20 mg total) by mouth 2 (two) times daily. 02/24/22  Yes Kaheem Halleck, Almyra Free, PA-C  albuterol (VENTOLIN HFA) 108 (90 Base) MCG/ACT inhaler Inhale 2 puffs into the lungs every 6 (six) hours as needed for wheezing or shortness of breath. 10/26/21   [name], FNP  famotidine (PEPCID) 40 MG tablet Take 1 tablet (40 mg total) by mouth 2 (two) times daily. 02/03/22 03/05/22  Dara Hoyer, FNP  flecainide  (TAMBOCOR) 100 MG tablet Take 150 mg by mouth 2 (two) times daily.    [provider]  fluticasone furoate-vilanterol (BREO ELLIPTA) 100-25 MCG/ACT AEPB Inhale 1 puff into the lungs daily. 01/26/22 07/25/22  [name], FNP  furosemide (LASIX) 20 MG tablet Take 1 tablet (20 mg total) by mouth daily. 11/30/21   [name], FNP  levalbuterol [facility] HFA) 45 MCG/ACT inhaler Inhale 2 puffs into the lungs every 6 (six) hours as needed for wheezing. 02/03/22   Ambs, Kathrine Cords, FNP  levocetirizine (XYZAL) 5 MG tablet Take 1 tablet (5 mg total) by mouth in the morning and at bedtime. 02/03/22 03/05/22  Dara Hoyer, FNP  montelukast (SINGULAIR) 10 MG tablet Take 1 tablet (10 mg total) by mouth at bedtime. 02/03/22   Dara Hoyer, FNP  Multiple Vitamins-Calcium (ONE-A-DAY WOMENS PO) Take 1 tablet by mouth daily.    [provider]  ondansetron (ZOFRAN) 4 MG tablet Take 1 tablet (4 mg total) by mouth every 8 (eight) hours as needed for nausea or vomiting. 02/23/22   Fayrene Helper, MD  potassium chloride SA (KLOR-CON M) 20 MEQ tablet Take 1 tablet (20 mEq total) by mouth 2 (two) times daily. 11/18/21   Fayrene Helper, MD  Vitamin D, Ergocalciferol, (DRISDOL) 1.25 MG (50000 UNIT) CAPS capsule Take 1 capsule (50,000 Units total) by mouth every 7 (seven) days. 01/26/22   [name], FNP  apixaban (ELIQUIS) 5 MG TABS tablet Take 2 tablets ('10mg'$ ) twice daily  for 7 days, then 1 tablet ('5mg'$ ) twice daily 04/20/19 04/20/19  Rodell Perna A, PA-C  levonorgestrel (MIRENA) 20 MCG/24HR IUD 1 each by Intrauterine route once.   06/25/20  [provider]      Allergies    Shellfish allergy, Sulfa antibiotics, and Adhesive [tape]    Review of Systems   Review of Systems  Constitutional:  Negative for chills and fever.  HENT:  Negative for congestion and sore throat.   Eyes: Negative.   Respiratory:  Negative for chest tightness and shortness of breath.   Cardiovascular:  Negative  for chest pain.  Gastrointestinal:  Positive for abdominal pain, diarrhea, nausea and vomiting.  Genitourinary: Negative.   Musculoskeletal:  Negative for arthralgias, joint swelling and neck pain.  Skin: Negative.  Negative for rash and wound.  Neurological:  Negative for dizziness, weakness, light-headedness, numbness and headaches.  Psychiatric/Behavioral: Negative.    All other systems reviewed and are negative.   Physical Exam Updated Vital Signs BP 134/70   Pulse 82   Temp 98 F (36.7 C)   Resp 20   Ht '5\' 2"'$  (1.575 m)   Wt (!) 154.8 kg   SpO2 98%   BMI 62.41 kg/m  Physical Exam Vitals and nursing note reviewed.  Constitutional:      Appearance: She is well-developed.  HENT:     Head: Normocephalic and atraumatic.  Eyes:     Conjunctiva/sclera: Conjunctivae normal.  Cardiovascular:     Rate and Rhythm: Normal rate and regular rhythm.     Heart sounds: Normal heart sounds.  Pulmonary:     Effort: Pulmonary effort is normal.     Breath sounds: Normal breath sounds. No wheezing.  Abdominal:     General: Bowel sounds are normal.     Palpations: Abdomen is soft.     Tenderness: There is abdominal tenderness in the right upper quadrant and right lower quadrant. Negative signs include Murphy's sign.  Musculoskeletal:        General: Normal range of motion.     Cervical back: Normal range of motion.  Skin:    General: Skin is warm and dry.  Neurological:     Mental Status: She is alert.     ED Results / Procedures / Treatments   Labs (all labs ordered are listed, but only abnormal results are displayed) Labs Reviewed  CBC WITH DIFFERENTIAL/PLATELET - Abnormal; Notable for the following components:      Result Value   Hemoglobin 11.0 (*)    HCT 35.3 (*)    All other components within normal limits  COMPREHENSIVE METABOLIC PANEL - Abnormal; Notable for the following components:   Calcium 8.8 (*)    Albumin 3.3 (*)    All other components within normal limits   URINALYSIS, ROUTINE W REFLEX MICROSCOPIC - Abnormal; Notable for the following components:   APPearance CLOUDY (*)    Hgb urine dipstick LARGE (*)    Leukocytes,Ua LARGE (*)    WBC, UA >50 (*)    Bacteria, UA FEW (*)    All other components within normal limits  LIPASE, BLOOD  POC URINE PREG, ED    EKG None  Radiology US Abdomen Limited RUQ (LIVER/GB)  Result Date: 02/24/2022 CLINICAL DATA:  RUQ abdominal pain EXAM: ULTRASOUND ABDOMEN LIMITED RIGHT UPPER QUADRANT COMPARISON:  None Available. FINDINGS: Gallbladder: No gallstones or wall thickening visualized. No sonographic Murphy sign noted by sonographer. Common bile duct: Diameter: 0.4 cm Liver: No focal lesion identified. Within normal  limits in parenchymal echogenicity. Portal vein is patent on color Doppler imaging with normal direction of blood flow towards the liver. Other: None. IMPRESSION: No evidence of cholelithiasis or cholecystitis. Electronically Signed   By: Marin Roberts M.D.   On: 02/24/2022 13:29    Procedures Procedures    Medications Ordered in ED Medications  dicyclomine (BENTYL) capsule 10 mg (has no administration in time range)    ED Course/ Medical Decision Making/ A&P                           Medical Decision Making Pt with a history of intermittent abdominal pain, mostly left sided, but has been specific to the RUQ with radiation into the scapula for the past several weeks.  Previous CT imaging suggesting gallbladder sludge, therefore was sent by pcp for emergent [place] to assess for acute cholecystitis.  N/V, occasional diarrhea, no documented fevers.    Amount and/or Complexity of Data Reviewed External Data Reviewed: radiology.    Details: Recent CT scan from October 16 reviewed. Labs: ordered.    Details: Labs reviewed, she has a chronic anemia with a hemoglobin of 11.0.  Her LFTs and lipase are normal.  Her urinalysis is abnormal with a large amount of hemoglobin and leukocytes, few bacteria.   However this is not a clean-catch specimen and patient denies dysuria.  She was diagnosed with a UTI at her last ED visit on the 16th and has a few more Keflex until completion.  She denies dysuria at this time.  She is currently menstruating.  Urine culture has been ordered.  I am not increasing or changing her antibiotics since she is asymptomatic from a UTI standpoint.  Denies vaginal discharge. Radiology: ordered.    Details: Ultrasound is negative for cholecystitis or cholelithiasis.  Risk Prescription drug management. Risk Details: Patient has been prescribed Bentyl for abdominal pain relief.  She also has Zofran from her primary care visit yesterday.  Discussed that she may need a HIDA scan for further evaluation of her gallbladder which she can discuss with her PCP.  She was advised to minimize fatty food intake which can trigger a gallbladder attack.           Final Clinical Impression(s) / ED Diagnoses Final diagnoses:  Right upper quadrant abdominal pain    Rx / DC Orders ED Discharge Orders          Ordered    dicyclomine (BENTYL) 20 MG tablet  2 times daily        02/24/22 1456              [name], PA-C 02/24/22 1544    Tretha Sciara, MD 02/27/22 [phone]

## 2022-02-24 NOTE — ED Triage Notes (Signed)
Pt reports abdomen pain and right side pain. Pt reports it hard to breathe sometimes. Symptoms started a couple of weeks. Pt reports hard to breathe over the last 4 days. Pt reports n/v for last few days.

## 2022-02-25 LAB — URINE CULTURE: Culture: <10000 — AB

## 2022-03-01 ENCOUNTER — Inpatient Hospital Stay: Payer: 59 | Admitting: Internal Medicine

## 2022-03-02 ENCOUNTER — Encounter: Payer: Self-pay | Admitting: Internal Medicine

## 2022-03-02 ENCOUNTER — Ambulatory Visit (INDEPENDENT_AMBULATORY_CARE_PROVIDER_SITE_OTHER): Payer: 59 | Admitting: Internal Medicine

## 2022-03-02 ENCOUNTER — Ambulatory Visit: Payer: 59 | Admitting: Obstetrics & Gynecology

## 2022-03-02 VITALS — BP 118/68 | HR 83 | Ht 62.0 in | Wt 335.0 lb

## 2022-03-02 DIAGNOSIS — N921 Excessive and frequent menstruation with irregular cycle: Secondary | ICD-10-CM

## 2022-03-02 DIAGNOSIS — R1031 Right lower quadrant pain: Secondary | ICD-10-CM | POA: Diagnosis not present

## 2022-03-02 DIAGNOSIS — R109 Unspecified abdominal pain: Secondary | ICD-10-CM | POA: Insufficient documentation

## 2022-03-02 DIAGNOSIS — N939 Abnormal uterine and vaginal bleeding, unspecified: Secondary | ICD-10-CM | POA: Diagnosis not present

## 2022-03-02 NOTE — Patient Instructions (Signed)
Thank you for trusting me with your care. To recap, today we discussed the following:   We will check blood count today. Follow up with your OB/GYN on Thursday for fibroids.  Abdominal pain - Will call with results of STI testing.

## 2022-03-02 NOTE — Progress Notes (Signed)
     CC: Follow-up (Following up from ED visit on 02/28/22, ED recommended pt f/u and have HIDA scan ordered for gallbladder check. Also has an U/S scheduled on 11/01 would like to know if it is still needed. )    HPI:Jessica Sanchez is a 36 y.o. female who presents for evaluation of abdominal pain. For the details of today's visit, please refer to the assessment and plan.   Past Medical History:  Diagnosis Date  . Angio-edema   . Arthritis    right shoulder  . Asthma   . Dyspnea   . Dysrhythmia    hx of SVT  . Fibroid (bleeding) (uterine)   . Fibroid, uterine   . H/O metrorrhagia   . Hypertension   . IUD (intrauterine device) in place 07/11/2015  . Menorrhagia   . Obesity, Class III, BMI 40-49.9 (morbid obesity) ([place]) 11/11/2018  . Sleep apnea    uses CPAP - setting 11  . SVT (supraventricular tachycardia)      Physical Exam: Vitals:   03/02/22 1535  BP: 118/68  Pulse: 83  SpO2: 98%  Weight: (!) 335 lb 0.6 oz (152 kg)  Height: '5\' 2"'$  (1.575 m)     Physical Exam   Assessment & Plan:   No problem-specific Assessment & Plan notes found for this encounter.    Lorene Dy, MD

## 2022-03-03 ENCOUNTER — Ambulatory Visit (HOSPITAL_COMMUNITY): Admission: RE | Admit: 2022-03-03 | Payer: 59 | Source: Ambulatory Visit

## 2022-03-03 LAB — CBC WITH DIFFERENTIAL/PLATELET
Basophils Absolute: 0 10*3/uL (ref 0.0–0.2)
Basos: 0 %
EOS (ABSOLUTE): 0.2 10*3/uL (ref 0.0–0.4)
Eos: 3 %
Hematocrit: 35.3 % (ref 34.0–46.6)
Hemoglobin: 11.3 g/dL (ref 11.1–15.9)
Immature Grans (Abs): 0 10*3/uL (ref 0.0–0.1)
Immature Granulocytes: 1 %
Lymphocytes Absolute: 1.9 10*3/uL (ref 0.7–3.1)
Lymphs: 36 %
MCH: 26.1 pg — ABNORMAL LOW (ref 26.6–33.0)
MCHC: 32 g/dL (ref 31.5–35.7)
MCV: 82 fL (ref 79–97)
Monocytes Absolute: 0.4 10*3/uL (ref 0.1–0.9)
Monocytes: 8 %
Neutrophils Absolute: 2.9 10*3/uL (ref 1.4–7.0)
Neutrophils: 52 %
Platelets: 387 10*3/uL (ref 150–450)
RBC: 4.33 x10E6/uL (ref 3.77–5.28)
RDW: 13.9 % (ref 11.7–15.4)
WBC: 5.4 10*3/uL (ref 3.4–10.8)

## 2022-03-03 NOTE — Assessment & Plan Note (Signed)
Patient has follow up 11/2 with Ob/gyn. Continues to have heavy period. Has uterine fibroids. Will check CBC today.

## 2022-03-03 NOTE — Assessment & Plan Note (Signed)
  Patient presented to clinic on 10/11 with generalized abdominal pain across lower abdomen and treated for constipation. Constipation improved, but on 10/16 she presented to ED with left sided abdominal pain. UA showed moderate leukocytes, negative nitrites, few bacteria, ketonuria, and proteinuria. Squamous cells 6-10. Prescribed Keflex. A CT Abdomen Pelvis with contrast shows high density material in gallbladder and enlarged lobular uterus. RUQ ultrasound not ordered because patient had left sided abdominal pain.  Presented again to clinic on 10/24 with RUQ pain , right shoulder pain and emesis. Pain was severe. Urgent RUQ ordered. Patient presented 10/25 to ED for continued RUQ pain radiated to shoulder and having episodes of emesis. No evidence of cholelithiasis or cholecystitis.   Patient presents today with intermittent abdominal pain. Pain is on her right quadrant at level of umbilicus and radiates up to RUQ. Not currently in pain after taking tylenol before visit. But pain is produced on palpation of RLQ on exam. She has been seeing her OB/gyn for abdominal pain , but reports this feeling different. She had a hysteroscopic D&C ablation on 12/01/21. She is currently having her menstrual cycle. She has been having some bleeding for 3 weeks. She reports having to change pads every few hours and they are soaked.Patient had unprotected sex 2 months ago. Denies urgency and dysuria. No concerning change in vaginal discharge.   Assessment/Plan: Abdominal Pain in RLQ, will test for STI. She has follow up with OB/GYN and I think it is likely this pain could be from fibroids if STI testing is negative.

## 2022-03-04 ENCOUNTER — Ambulatory Visit: Payer: 59 | Admitting: Adult Health

## 2022-03-04 ENCOUNTER — Encounter: Payer: Self-pay | Admitting: Adult Health

## 2022-03-04 VITALS — BP 147/89 | HR 75 | Ht 62.0 in | Wt 337.4 lb

## 2022-03-04 DIAGNOSIS — D219 Benign neoplasm of connective and other soft tissue, unspecified: Secondary | ICD-10-CM

## 2022-03-04 DIAGNOSIS — N938 Other specified abnormal uterine and vaginal bleeding: Secondary | ICD-10-CM | POA: Diagnosis not present

## 2022-03-04 MED ORDER — MEGESTROL ACETATE 40 MG PO TABS: ORAL_TABLET | ORAL | 0 refills | Status: DC

## 2022-03-04 NOTE — Progress Notes (Signed)
  Subjective:     Patient ID: Jessica Sanchez, female   DOB: [birth year], 36 y.o.   MRN: [id number]  HPI Jessica Sanchez is a 36 year old black female,single, G1P0010, in complaining of bleeding for 3 weeks, has been heavy at times with clots. She had ablation 12/01/21 and it has not stopped periods. She says she is off eliquis.  Hemoglobin was 11.3 on 03/02/22, and Nuswab is pending.  Last pap was negative HPV and malignancy 07/10/21  PCP is [name], NP  Review of Systems Bleeding for 3 weeks, heavy at times Has had stomach issues too Reviewed past medical,surgical, social and family history. Reviewed medications and allergies.     Objective:   Physical Exam BP (!) 147/89 (BP Location: Left Arm, Patient Position: Sitting, Cuff Size: Normal)   Pulse 75   Ht '5\' 2"'$  (1.575 m)   Wt (!) 337 lb 6.4 oz (153 kg)   BMI 61.71 kg/m     Skin warm and dry.Pelvic: external genitalia is normal in appearance no lesions, vagina:+period blood, no odor,urethra has no lesions or masses noted, cervix:smooth, uterus: is enlarged and +fibroids felt esp on right, adnexa: no masses or tenderness noted. Bladder is non tender and no masses felt.   Upstream - 03/04/22 1050       Pregnancy Intention Screening   Does the patient want to become pregnant in the next year? No    Does the patient's partner want to become pregnant in the next year? No    Would the patient like to discuss contraceptive options today? No      Contraception Wrap Up   Current Method No Method - Other Reason;Female Condom   ablation   End Method No Method - Other Reason;Female Condom   ablation   Contraception Counseling Provided No            Examination chaperoned by Levy Pupa LPN  Assessment:     1. DUB (dysfunctional uterine bleeding) She is sp ablation 12/01/21  Has been bleeding for about 3 weeks, had been heavy at times, wears 2-3 pads and pull ups Discussed with Dr Elonda Husky Will rx megace to try to stop bleeding Meds ordered this  encounter  Medications   megestrol (MEGACE) 40 MG tablet    Sig: Take 3 x 5 days then 2 x 5 days then 2 daily    Dispense:  90 tablet    Refill:  0    Order Specific Question:   Supervising Provider    Answer:   Tania Ade H [[id number]]     2. Fibroid, 433 cc(50% increase over previous) Has know fibroids last [place] was 08/24/21    Plan:     Return in 5 weeks to see Dr Elonda Husky to discuss further options

## 2022-03-05 ENCOUNTER — Telehealth: Payer: Self-pay | Admitting: Internal Medicine

## 2022-03-05 DIAGNOSIS — A749 Chlamydial infection, unspecified: Secondary | ICD-10-CM

## 2022-03-05 DIAGNOSIS — A599 Trichomoniasis, unspecified: Secondary | ICD-10-CM

## 2022-03-05 LAB — NUSWAB VAGINITIS PLUS (VG+)
Candida albicans, NAA: NEGATIVE
Candida glabrata, NAA: NEGATIVE
Chlamydia trachomatis, NAA: POSITIVE — AB
Neisseria gonorrhoeae, NAA: NEGATIVE
Trich vag by NAA: POSITIVE — AB

## 2022-03-05 MED ORDER — METRONIDAZOLE 500 MG PO TABS: 500.0000 mg | ORAL_TABLET | Freq: Two times a day (BID) | ORAL | 0 refills | Status: AC

## 2022-03-05 MED ORDER — DOXYCYCLINE HYCLATE 100 MG PO TABS: 100.0000 mg | ORAL_TABLET | Freq: Two times a day (BID) | ORAL | 0 refills | Status: AC

## 2022-03-05 NOTE — Telephone Encounter (Signed)
Patient presented with abdominal pain and tested for STI. Results positive for Trichomonas and Chlamydia. Unable to reach patient. MyChart message sent to patient.Antibiotics sent to pharmacy.  Trichomonas vaginalis infection - metroNIDAZOLE (FLAGYL) 500 MG tablet; Take 1 tablet (500 mg total) by mouth 2 (two) times daily for 7 days.  Dispense: 14 tablet; Refill: 0  Chlamydia infection - doxycycline (VIBRA-TABS) 100 MG tablet; Take 1 tablet (100 mg total) by mouth 2 (two) times daily for 7 days. 1 po bid  Dispense: 14 tablet; Refill: 0

## 2022-03-17 ENCOUNTER — Encounter (INDEPENDENT_AMBULATORY_CARE_PROVIDER_SITE_OTHER): Payer: Self-pay

## 2022-03-17 NOTE — Progress Notes (Deleted)
PCP:  Jessica Sanchez, [name] Cardiologist: None Electrophysiologist: [name], MD   Jessica Sanchez is a 36 y.o. female seen today for [name], MD for post hospital follow up.    Seen in ED 9/13 for palpitations/SVT in 140s by her apple watch. Work up in ED was unremarkable.  CTA without PE, focal pleural thickening between ribs 7-8 noted, felt to be chronic scarring vs atelectasis given lack of infectious symptoms.   Since discharge from hospital the patient reports doing ***.  she denies chest pain, palpitations, dyspnea, PND, orthopnea, nausea, vomiting, dizziness, syncope, edema, weight gain, or early satiety.   Past Medical History:  Diagnosis Date   Angio-edema    Arthritis    right shoulder   Asthma    Dyspnea    Dysrhythmia    hx of SVT   Fibroid (bleeding) (uterine)    Fibroid, uterine    H/O metrorrhagia    Hypertension    IUD (intrauterine device) in place 07/11/2015   Menorrhagia    Obesity, Class III, BMI 40-49.9 (morbid obesity) ([place]) 11/11/2018   Sleep apnea    uses CPAP - setting 11   SVT (supraventricular tachycardia)    Past Surgical History:  Procedure Laterality Date   DILATATION AND CURETTAGE/HYSTEROSCOPY WITH MINERVA N/A 12/01/2021   Procedure: DILATATION AND CURETTAGE/HYSTEROSCOPY WITH MINERVA;  Surgeon: [name], MD;  Location: [facility];  Service: Gynecology;  Laterality: N/A;   HYSTEROSCOPY N/A 09/17/2020   Procedure: HYSTEROSCOPY;  Surgeon: [name], MD;  Location: [facility];  Service: Gynecology;  Laterality: N/A;   HYSTEROSCOPY WITH D & C N/A 09/27/2013   Procedure: DILATATION AND CURETTAGE /HYSTEROSCOPY and insertion of Mirena IUD ;  Surgeon: [name]. Harrington Challenger, MD;  Location: [facility];  Service: Gynecology;  Laterality: N/A;   IUD REMOVAL N/A 09/17/2020   Procedure: INTRAUTERINE DEVICE (IUD) REMOVAL (2);  Surgeon: [name], MD;  Location: [facility];  Service: Gynecology;  Laterality: N/A;   MYOMECTOMY  02/03/2011    Procedure: MYOMECTOMY;  Surgeon: [name], MD;  Location: [facility];  Service: Gynecology;  Laterality: N/A;   SVT ABLATION N/A 11/08/2018   Procedure: SVT ABLATION;  Surgeon: [name], MD;  Location: [place] [facility];  Service: Cardiovascular;  Laterality: N/A;   SVT ABLATION N/A 12/26/2020   Procedure: SVT ABLATION;  Surgeon: [name], MD;  Location: [place] [facility];  Service: Cardiovascular;  Laterality: N/A;   TONSILLECTOMY     TYMPANOSTOMY TUBE PLACEMENT      Current Outpatient Medications  Medication Sig Dispense Refill   albuterol (VENTOLIN HFA) 108 (90 Base) MCG/ACT inhaler Inhale 2 puffs into the lungs every 6 (six) hours as needed for wheezing or shortness of breath. 8 g 0   dicyclomine (BENTYL) 20 MG tablet Take 1 tablet (20 mg total) by mouth 2 (two) times daily. 20 tablet 0   flecainide (TAMBOCOR) 100 MG tablet Take 150 mg by mouth 2 (two) times daily.     fluticasone furoate-vilanterol (BREO ELLIPTA) 100-25 MCG/ACT AEPB Inhale 1 puff into the lungs daily. 30 each 5   furosemide (LASIX) 20 MG tablet Take 1 tablet (20 mg total) by mouth daily. 30 tablet 0   levalbuterol (XOPENEX HFA) 45 MCG/ACT inhaler Inhale 2 puffs into the lungs every 6 (six) hours as needed for wheezing. 1 each 3   levocetirizine (XYZAL) 5 MG tablet Take 1 tablet (5 mg total) by mouth in the morning  and at bedtime. 60 tablet 0   megestrol (MEGACE) 40 MG tablet Take 3 x 5 days then 2 x 5 days then 2 daily 90 tablet 0   montelukast (SINGULAIR) 10 MG tablet Take 1 tablet (10 mg total) by mouth at bedtime. 30 tablet 5   Multiple Vitamins-Calcium (ONE-A-DAY WOMENS PO) Take 1 tablet by mouth daily.     potassium chloride SA (KLOR-CON M) 20 MEQ tablet Take 1 tablet (20 mEq total) by mouth 2 (two) times daily. 40 tablet 0   Vitamin D, Ergocalciferol, (DRISDOL) 1.25 MG (50000 UNIT) CAPS capsule Take 1 capsule (50,000 Units total) by mouth every 7 (seven) days. 5 capsule 2   No current  facility-administered medications for this visit.    Allergies  Allergen Reactions   Shellfish Allergy Shortness Of Breath, Itching and Swelling    Mouth became swollen, throat started itching, and developed shortness of breath   Sulfa Antibiotics Itching, Shortness Of Breath and Swelling    Mouth became swollen, throat started itching, and developed shortness of breath   Adhesive [Tape] Itching    Depends on the type of medical tape    Social History   Socioeconomic History   Marital status: Single    Spouse name: Not on file   Number of children: Not on file   Years of education: Not on file   Highest education level: Not on file  Occupational History   Not on file  Tobacco Use   Smoking status: Never   Smokeless tobacco: Never   Tobacco comments:    socially  Vaping Use   Vaping Use: Never used  Substance and Sexual Activity   Alcohol use: Yes    Comment: occasional   Drug use: Yes    Types: Marijuana    Comment: on occ   Sexual activity: Not Currently    Birth control/protection: Condom  Other Topics Concern   Not on file  Social History Narrative   Not on file   Social Determinants of Health   Financial Resource Strain: Not on file  Food Insecurity: Not on file  Transportation Needs: Not on file  Physical Activity: Not on file  Stress: Not on file  Social Connections: Not on file  Intimate Partner Violence: Not on file     Review of Systems: All other systems reviewed and are otherwise negative except as noted above.  Physical Exam: There were no vitals filed for this visit.  GEN- The patient is well appearing, alert and oriented x 3 today.   HEENT: normocephalic, atraumatic; sclera clear, conjunctiva pink; hearing intact; oropharynx clear; neck supple, no JVP Lymph- no cervical lymphadenopathy Lungs- Clear to ausculation bilaterally, normal work of breathing.  No wheezes, rales, rhonchi Heart- {Blank single:19197::"Regular","Irregularly  irregular"} rate and rhythm, no murmurs, rubs or gallops, PMI not laterally displaced GI- soft, non-tender, non-distended, bowel sounds present, no hepatosplenomegaly Extremities- {EDEMA PXTGG:26948} peripheral edema. no clubbing or cyanosis; DP/PT/radial pulses 2+ bilaterally MS- no significant deformity or atrophy Skin- warm and dry, no rash or lesion Psych- euthymic mood, full affect Neuro- strength and sensation are intact  EKG is ordered. Personal review of EKG from today shows ***  Additional studies reviewed include: Previous EP office notes.   Assessment and Plan:  1. SVT S/p ablation of a left sided AVNRT 12/2020 EKG today shows *** Continue flecainide 150 mg BID  2. Obesity There is no height or weight on file to calculate BMI.  Encouraged lifestyle modification   3.  HTN Stable on current regimen   4. OSA  Encouraged nightly CPAP   Follow up with {EPMDS:28135} in {EPFOLLOW UP:28173}  Shirley Friar, PA-C  03/17/22 8:26 AM

## 2022-03-23 ENCOUNTER — Ambulatory Visit: Payer: 59 | Admitting: Student

## 2022-03-23 DIAGNOSIS — I471 Supraventricular tachycardia, unspecified: Secondary | ICD-10-CM

## 2022-03-23 DIAGNOSIS — I10 Essential (primary) hypertension: Secondary | ICD-10-CM

## 2022-03-23 DIAGNOSIS — G4733 Obstructive sleep apnea (adult) (pediatric): Secondary | ICD-10-CM

## 2022-03-30 ENCOUNTER — Ambulatory Visit (INDEPENDENT_AMBULATORY_CARE_PROVIDER_SITE_OTHER): Payer: 59 | Admitting: Family Medicine

## 2022-03-30 ENCOUNTER — Ambulatory Visit: Payer: 59 | Admitting: Internal Medicine

## 2022-03-30 ENCOUNTER — Encounter: Payer: Self-pay | Admitting: Family Medicine

## 2022-03-30 ENCOUNTER — Ambulatory Visit: Payer: 59 | Admitting: Family Medicine

## 2022-03-30 VITALS — BP 133/83 | HR 83 | Ht 62.0 in | Wt 340.1 lb

## 2022-03-30 DIAGNOSIS — E7849 Other hyperlipidemia: Secondary | ICD-10-CM

## 2022-03-30 DIAGNOSIS — R7301 Impaired fasting glucose: Secondary | ICD-10-CM

## 2022-03-30 DIAGNOSIS — A599 Trichomoniasis, unspecified: Secondary | ICD-10-CM

## 2022-03-30 DIAGNOSIS — A749 Chlamydial infection, unspecified: Secondary | ICD-10-CM | POA: Diagnosis not present

## 2022-03-30 DIAGNOSIS — E559 Vitamin D deficiency, unspecified: Secondary | ICD-10-CM

## 2022-03-30 DIAGNOSIS — R69 Illness, unspecified: Secondary | ICD-10-CM | POA: Diagnosis not present

## 2022-03-30 DIAGNOSIS — E038 Other specified hypothyroidism: Secondary | ICD-10-CM

## 2022-03-30 DIAGNOSIS — Z113 Encounter for screening for infections with a predominantly sexual mode of transmission: Secondary | ICD-10-CM | POA: Diagnosis not present

## 2022-03-30 DIAGNOSIS — Z6841 Body Mass Index (BMI) 40.0 and over, adult: Secondary | ICD-10-CM | POA: Diagnosis not present

## 2022-03-30 NOTE — Assessment & Plan Note (Signed)
She reports following up with weight management on 04/07/22 Referral placed to a licensed dietitian Encouraged heart healthy diet with increased physical activities BP Readings from Last 3 Encounters:  03/30/22 133/83  03/04/22 (!) 147/89  03/02/22 118/68

## 2022-03-30 NOTE — Patient Instructions (Addendum)
I appreciate the opportunity to provide care to you today!    Follow up:  3 months  Labs: please stop by the lab today to get your blood drawn (, TSH, Lipid profile, HgA1c, Vit D)  Referrals today-  dietitian   Please continue to a heart-healthy diet and increase your physical activities. Try to exercise for 53mns at least three times a week.      It was a pleasure to see you and I look forward to continuing to work together on your health and well-being. Please do not hesitate to call the office if you need care or have questions about your care.   Have a wonderful day and week. With Gratitude, GAlvira MondayMSN, FNP-BC

## 2022-03-30 NOTE — Progress Notes (Signed)
Established Patient Office Visit  Subjective:  Patient ID: Jessica Sanchez, female    DOB: November 02, 1985  Age: 36 y.o. MRN: [id number]  CC:  Chief Complaint  Patient presents with   Follow-up    4 week f/u, pt reports a little pain once in a while but overall getting better. Pt would like to discuss weight, has been adjusting her diet to healthier choices and nothing seems to help.    Exposure to STD    Std retest.     HPI [name] is a 36 y.o. female with past medical history of morbid obesity and menorrhagia presents for f/u of  chronic medical conditions.  Morbid obesity: She reports implementing lifestyle changes with heart healthy diet and increase physical activities.  She reports that she has also been implementing intermittent fasting.  She has an appointment with weight management on 04/07/2022 at 5 PM but is unsure if she will be able to make it.  She would like to discuss weight loss options today.  STD testing: She denies recent exposure to STD or symptoms of STD.  She denies being coital acts  She would like to be tested for STD today as she tested positive for chlamydia and trichomonas on 03/02/2022 and was treated adequately.  Past Medical History:  Diagnosis Date   Angio-edema    Arthritis    right shoulder   Asthma    Dyspnea    Dysrhythmia    hx of SVT   Fibroid (bleeding) (uterine)    Fibroid, uterine    H/O metrorrhagia    Hypertension    IUD (intrauterine device) in place 07/11/2015   Menorrhagia    Obesity, Class III, BMI 40-49.9 (morbid obesity) ([place]) 11/11/2018   Sleep apnea    uses CPAP - setting 11   SVT (supraventricular tachycardia)     Past Surgical History:  Procedure Laterality Date   DILATATION AND CURETTAGE/HYSTEROSCOPY WITH MINERVA N/A 12/01/2021   Procedure: DILATATION AND CURETTAGE/HYSTEROSCOPY WITH MINERVA;  Surgeon: [name], MD;  [facility];  Service: Gynecology;  Laterality: N/A;   HYSTEROSCOPY N/A 09/17/2020    Procedure: HYSTEROSCOPY;  Surgeon: [name], MD;  [facility]: [facility];  Service: Gynecology;  Laterality: N/A;   HYSTEROSCOPY WITH D & C N/A 09/27/2013   Procedure: DILATATION AND CURETTAGE /HYSTEROSCOPY and insertion of Mirena IUD ;  Surgeon: [name]. Harrington Challenger, MD;  Location: [facility];  Service: Gynecology;  Laterality: N/A;   IUD REMOVAL N/A 09/17/2020   Procedure: INTRAUTERINE DEVICE (IUD) REMOVAL (2);  Surgeon: [name], MD;  [facility]: [facility];  Service: Gynecology;  Laterality: N/A;   MYOMECTOMY  02/03/2011   Procedure: MYOMECTOMY;  Surgeon: [name], MD;  [facility]: [facility];  Service: Gynecology;  Laterality: N/A;   SVT ABLATION N/A 11/08/2018   Procedure: SVT ABLATION;  Surgeon: [name], MD;  Location: [facility];  Service: Cardiovascular;  Laterality: N/A;   SVT ABLATION N/A 12/26/2020   Procedure: SVT ABLATION;  Surgeon: [name], MD;  Location: [facility];  Service: Cardiovascular;  Laterality: N/A;   TONSILLECTOMY     TYMPANOSTOMY TUBE PLACEMENT      Family History  Problem Relation Age of Onset   Cirrhosis Mother    Diabetes Mother    Cancer Maternal Grandfather    Hypertension Sister    Diabetes Sister    Anesthesia problems Neg Hx    Hypotension Neg Hx  Malignant hyperthermia Neg Hx    Pseudochol deficiency Neg Hx     Social History   Socioeconomic History   Marital status: Single    Spouse name: Not on file   Number of children: Not on file   Years of education: Not on file   Highest education level: Not on file  Occupational History   Not on file  Tobacco Use   Smoking status: Never   Smokeless tobacco: Never   Tobacco comments:    socially  Vaping Use   Vaping Use: Never used  Substance and Sexual Activity   Alcohol use: Yes    Comment: occasional   Drug use: Yes    Types: Marijuana    Comment: on occ   Sexual activity: Not Currently    Birth control/protection: Condom  Other Topics Concern   Not on file   Social History Narrative   Not on file   Social Determinants of Health   Financial Resource Strain: Not on file  Food Insecurity: Not on file  Transportation Needs: Not on file  Physical Activity: Not on file  Stress: Not on file  Social Connections: Not on file  Intimate Partner Violence: Not on file    Outpatient Medications Prior to Visit  Medication Sig Dispense Refill   albuterol (VENTOLIN HFA) 108 (90 Base) MCG/ACT inhaler Inhale 2 puffs into the lungs every 6 (six) hours as needed for wheezing or shortness of breath. 8 g 0   dicyclomine (BENTYL) 20 MG tablet Take 1 tablet (20 mg total) by mouth 2 (two) times daily. 20 tablet 0   flecainide (TAMBOCOR) 100 MG tablet Take 150 mg by mouth 2 (two) times daily.     fluticasone furoate-vilanterol (BREO ELLIPTA) 100-25 MCG/ACT AEPB Inhale 1 puff into the lungs daily. 30 each 5   furosemide (LASIX) 20 MG tablet Take 1 tablet (20 mg total) by mouth daily. 30 tablet 0   levalbuterol (XOPENEX HFA) 45 MCG/ACT inhaler Inhale 2 puffs into the lungs every 6 (six) hours as needed for wheezing. 1 each 3   megestrol (MEGACE) 40 MG tablet Take 3 x 5 days then 2 x 5 days then 2 daily 90 tablet 0   montelukast (SINGULAIR) 10 MG tablet Take 1 tablet (10 mg total) by mouth at bedtime. 30 tablet 5   Multiple Vitamins-Calcium (ONE-A-DAY WOMENS PO) Take 1 tablet by mouth daily.     potassium chloride SA (KLOR-CON M) 20 MEQ tablet Take 1 tablet (20 mEq total) by mouth 2 (two) times daily. 40 tablet 0   Vitamin D, Ergocalciferol, (DRISDOL) 1.25 MG (50000 UNIT) CAPS capsule Take 1 capsule (50,000 Units total) by mouth every 7 (seven) days. 5 capsule 2   levocetirizine (XYZAL) 5 MG tablet Take 1 tablet (5 mg total) by mouth in the morning and at bedtime. 60 tablet 0   No facility-administered medications prior to visit.    Allergies  Allergen Reactions   Shellfish Allergy Shortness Of Breath, Itching and Swelling    Mouth became swollen, throat started  itching, and developed shortness of breath   Sulfa Antibiotics Itching, Shortness Of Breath and Swelling    Mouth became swollen, throat started itching, and developed shortness of breath   Adhesive [Tape] Itching    Depends on the type of medical tape    ROS Review of Systems  Constitutional:  Negative for chills, fatigue and fever.  Eyes:  Negative for visual disturbance.  Respiratory:  Negative for chest tightness and shortness  of breath.   Endocrine: Negative for polydipsia, polyphagia and polyuria.  Neurological:  Negative for dizziness and headaches.      Objective:    Physical Exam Constitutional:      Appearance: She is obese.  HENT:     Head: Normocephalic.  Cardiovascular:     Rate and Rhythm: Normal rate and regular rhythm.     Pulses: Normal pulses.     Heart sounds: Normal heart sounds.  Pulmonary:     Breath sounds: Normal breath sounds.  Musculoskeletal:     Cervical back: No rigidity.  Neurological:     Mental Status: She is alert.     BP 133/83   Pulse 83   Ht _0  (1.575 m)   Wt (!) 340 lb 1.9 oz (154.3 kg)   SpO2 95%   BMI 62.21 kg/m  Wt Readings from Last 3 Encounters:  03/30/22 (!) 340 lb 1.9 oz (154.3 kg)  03/04/22 (!) 337 lb 6.4 oz (153 kg)  03/02/22 (!) 335 lb 0.6 oz (152 kg)    Lab Results  Component Value Date   TSH 2.190 10/27/2021   Lab Results  Component Value Date   WBC 5.4 03/02/2022   HGB 11.3 03/02/2022   HCT 35.3 03/02/2022   MCV 82 03/02/2022   PLT 387 03/02/2022   Lab Results  Component Value Date   NA 136 02/24/2022   K 4.4 02/24/2022   CO2 25 02/24/2022   GLUCOSE 80 02/24/2022   BUN 9 02/24/2022   CREATININE 0.65 02/24/2022   BILITOT 0.3 02/24/2022   ALKPHOS 52 02/24/2022   AST 16 02/24/2022   ALT 13 02/24/2022   PROT 7.2 02/24/2022   ALBUMIN 3.3 (L) 02/24/2022   CALCIUM 8.8 (L) 02/24/2022   ANIONGAP 7 02/24/2022   EGFR 115 11/27/2021   Lab Results  Component Value Date   CHOL 141 10/27/2021    Lab Results  Component Value Date   HDL 33 (L) 10/27/2021   Lab Results  Component Value Date   LDLCALC 97 10/27/2021   Lab Results  Component Value Date   TRIG 48 10/27/2021   Lab Results  Component Value Date   CHOLHDL 4.3 10/27/2021   Lab Results  Component Value Date   HGBA1C 6.0 (H) 10/27/2021      Assessment & Plan:  Morbid obesity with body mass index (BMI) of 60.0 to 69.9 in adult [facility]) Assessment & Plan: She reports following up with weight management on 04/07/22 Referral placed to a licensed dietitian Encouraged heart healthy diet with increased physical activities BP Readings from Last 3 Encounters:  03/30/22 133/83  03/04/22 (!) 147/89  03/02/22 118/68     Orders: -     Amb ref to Medical Nutrition Therapy-MNT  Screening for STD (sexually transmitted disease) Assessment & Plan: We will screen patient today for STDs Instructed patient that she will be contacted with the results of her STD examination She denies recent coital acts  Orders: -     NuSwab Vaginitis Plus (VG+)  Trichomonas vaginalis infection -     NuSwab Vaginitis Plus (VG+)  Chlamydia infection -     NuSwab Vaginitis Plus (VG+)  Vitamin D deficiency -     VITAMIN D 25 Hydroxy (Vit-D Deficiency, Fractures)  IFG (impaired fasting glucose) -     Hemoglobin A1c  Other hyperlipidemia -     Lipid panel  Other specified hypothyroidism -     TSH + free T4  Follow-up: Return in about 3 months (around 06/30/2022).   [name], FNP

## 2022-03-30 NOTE — Assessment & Plan Note (Signed)
We will screen patient today for STDs Instructed patient that she will be contacted with the results of her STD examination She denies recent coital acts

## 2022-03-31 LAB — TSH+FREE T4
Free T4: 1.2 ng/dL (ref 0.82–1.77)
TSH: 1.31 u[IU]/mL (ref 0.450–4.500)

## 2022-03-31 LAB — LIPID PANEL
Chol/HDL Ratio: 4.1 ratio (ref 0.0–4.4)
Cholesterol, Total: 135 mg/dL (ref 100–199)
HDL: 33 mg/dL — ABNORMAL LOW (ref >39)
LDL Chol Calc (NIH): 93 mg/dL (ref 0–99)
Triglycerides: 35 mg/dL (ref 0–149)
VLDL Cholesterol Cal: 9 mg/dL (ref 5–40)

## 2022-03-31 LAB — HEMOGLOBIN A1C
Est. average glucose Bld gHb Est-mCnc: 123 mg/dL
Hgb A1c MFr Bld: 5.9 % — ABNORMAL HIGH (ref 4.8–5.6)

## 2022-03-31 LAB — VITAMIN D 25 HYDROXY (VIT D DEFICIENCY, FRACTURES): Vit D, 25-Hydroxy: 14.1 ng/mL — ABNORMAL LOW (ref 30.0–100.0)

## 2022-04-05 ENCOUNTER — Other Ambulatory Visit: Payer: Self-pay | Admitting: Family Medicine

## 2022-04-05 DIAGNOSIS — E559 Vitamin D deficiency, unspecified: Secondary | ICD-10-CM

## 2022-04-05 MED ORDER — VITAMIN D (ERGOCALCIFEROL) 1.25 MG (50000 UNIT) PO CAPS: 50000.0000 [IU] | ORAL_CAPSULE | ORAL | 2 refills | Status: DC

## 2022-04-07 ENCOUNTER — Encounter (INDEPENDENT_AMBULATORY_CARE_PROVIDER_SITE_OTHER): Payer: 59 | Admitting: Family Medicine

## 2022-04-08 ENCOUNTER — Ambulatory Visit: Payer: 59 | Admitting: Obstetrics & Gynecology

## 2022-04-08 ENCOUNTER — Encounter: Payer: Self-pay | Admitting: Obstetrics & Gynecology

## 2022-04-08 VITALS — BP 127/76 | HR 83 | Ht 62.0 in | Wt 342.0 lb

## 2022-04-08 DIAGNOSIS — N938 Other specified abnormal uterine and vaginal bleeding: Secondary | ICD-10-CM

## 2022-04-08 DIAGNOSIS — D219 Benign neoplasm of connective and other soft tissue, unspecified: Secondary | ICD-10-CM

## 2022-04-08 LAB — NUSWAB VAGINITIS PLUS (VG+)
Candida albicans, NAA: NEGATIVE
Candida glabrata, NAA: NEGATIVE
Chlamydia trachomatis, NAA: NEGATIVE
Neisseria gonorrhoeae, NAA: NEGATIVE
Trich vag by NAA: NEGATIVE

## 2022-04-08 NOTE — Progress Notes (Signed)
Follow up appointment for results: Megestrol management  Chief Complaint  Patient presents with   Follow-up    Dysfunctional uterine bleeding (Patient states bleeding has stopped)    Blood pressure 127/76, pulse 83, height '5\' 2"'$  (1.575 m), weight (!) 342 lb (155.1 kg).  No results found.  Pt is now off eliquis  S/P endometrial ablation Megestrol therapy  Bled regular period first of November and then light bleeding Nov 22-24  MEDS ordered this encounter: No orders of the defined types were placed in this encounter.   Orders for this encounter: No orders of the defined types were placed in this encounter.   Impression + Management Plan    ICD-10-CM   1. DUB (dysfunctional uterine bleeding)  N93.8    improved management with megestrol therapy, and stopping eliquis.  Stop megestrol for 7 days beginning 05/03/22 then restart    2. Fibroid, 433 cc(50% increase over previous)  D21.9       Follow Up:   Send mychart message after she has her withdrawal menses   All questions were answered.  Past Medical History:  Diagnosis Date   Angio-edema    Arthritis    right shoulder   Asthma    Dyspnea    Dysrhythmia    hx of SVT   Fibroid (bleeding) (uterine)    Fibroid, uterine    H/O metrorrhagia    Hypertension    IUD (intrauterine device) in place 07/11/2015   Menorrhagia    Obesity, Class III, BMI 40-49.9 (morbid obesity) ([place]) 11/11/2018   Sleep apnea    uses CPAP - setting 11   SVT (supraventricular tachycardia)     Past Surgical History:  Procedure Laterality Date   DILATATION AND CURETTAGE/HYSTEROSCOPY WITH MINERVA N/A 12/01/2021   Procedure: DILATATION AND CURETTAGE/HYSTEROSCOPY WITH MINERVA;  Surgeon: [name], MD;  [facility];  Service: Gynecology;  Laterality: N/A;   HYSTEROSCOPY N/A 09/17/2020   Procedure: HYSTEROSCOPY;  Surgeon: [name], MD;  [facility];  Service: Gynecology;  Laterality: N/A;   HYSTEROSCOPY WITH D & C N/A  09/27/2013   Procedure: DILATATION AND CURETTAGE /HYSTEROSCOPY and insertion of Mirena IUD ;  Surgeon: [name]. Harrington Challenger, MD;  Location: [facility];  Service: Gynecology;  Laterality: N/A;   IUD REMOVAL N/A 09/17/2020   Procedure: INTRAUTERINE DEVICE (IUD) REMOVAL (2);  Surgeon: [name], MD;  [facility];  Service: Gynecology;  Laterality: N/A;   MYOMECTOMY  02/03/2011   Procedure: MYOMECTOMY;  Surgeon: [name], MD;  [facility];  Service: Gynecology;  Laterality: N/A;   SVT ABLATION N/A 11/08/2018   Procedure: SVT ABLATION;  Surgeon: [name], MD;  Location: [facility];  Service: Cardiovascular;  Laterality: N/A;   SVT ABLATION N/A 12/26/2020   Procedure: SVT ABLATION;  Surgeon: [name], MD;  Location: [facility];  Service: Cardiovascular;  Laterality: N/A;   TONSILLECTOMY     TYMPANOSTOMY TUBE PLACEMENT      OB History     Gravida  1   Para      Term      Preterm      AB  1   Living         SAB  1   IAB      Ectopic      Multiple      Live Births              Allergies  Allergen Reactions   Shellfish Allergy Shortness Of Breath, Itching and Swelling    Mouth became swollen, throat started itching, and developed shortness of breath   Sulfa Antibiotics Itching, Shortness Of Breath and Swelling    Mouth became swollen, throat started itching, and developed shortness of breath   Adhesive [Tape] Itching    Depends on the type of medical tape    Social History   Socioeconomic History   Marital status: Single    Spouse name: Not on file   Number of children: Not on file   Years of education: Not on file   Highest education level: Not on file  Occupational History   Not on file  Tobacco Use   Smoking status: Never   Smokeless tobacco: Never   Tobacco comments:    socially  Vaping Use   Vaping Use: Never used  Substance and Sexual Activity   Alcohol use: Yes    Comment: occasional   Drug use: Yes     Types: Marijuana    Comment: on occ   Sexual activity: Not Currently    Birth control/protection: Condom  Other Topics Concern   Not on file  Social History Narrative   Not on file   Social Determinants of Health   Financial Resource Strain: Not on file  Food Insecurity: Not on file  Transportation Needs: Not on file  Physical Activity: Not on file  Stress: Not on file  Social Connections: Not on file    Family History  Problem Relation Age of Onset   Cirrhosis Mother    Diabetes Mother    Cancer Maternal Grandfather    Hypertension Sister    Diabetes Sister    Anesthesia problems Neg Hx    Hypotension Neg Hx    Malignant hyperthermia Neg Hx    Pseudochol deficiency Neg Hx

## 2022-04-11 NOTE — Progress Notes (Signed)
Please inform the patient that her STD test was negative for chlamydia, gonorrhea, trichomonas, yeast infection, and bacterial vaginosis

## 2022-04-12 ENCOUNTER — Encounter: Payer: Self-pay | Admitting: Family Medicine

## 2022-04-13 ENCOUNTER — Ambulatory Visit (INDEPENDENT_AMBULATORY_CARE_PROVIDER_SITE_OTHER): Payer: 59 | Admitting: Family Medicine

## 2022-04-13 ENCOUNTER — Encounter: Payer: Self-pay | Admitting: Family Medicine

## 2022-04-13 DIAGNOSIS — E559 Vitamin D deficiency, unspecified: Secondary | ICD-10-CM

## 2022-04-13 DIAGNOSIS — Z113 Encounter for screening for infections with a predominantly sexual mode of transmission: Secondary | ICD-10-CM

## 2022-04-13 MED ORDER — VITAMIN D (ERGOCALCIFEROL) 1.25 MG (50000 UNIT) PO CAPS: 50000.0000 [IU] | ORAL_CAPSULE | ORAL | 2 refills | Status: DC

## 2022-04-13 NOTE — Progress Notes (Signed)
Virtual Visit via Telephone Note   This visit type was conducted via telephone. This format is felt to be most appropriate for this patient at this time.  The patient did not have access to video technology/had technical difficulties with video requiring transitioning to audio format only (telephone).  All issues noted in this document were discussed and addressed.  No physical exam could be performed with this format.  Evaluation Performed:  Follow-up visit  Date:  04/13/2022   ID:  Jessica, Sanchez [birth year], MRN [id number]  Patient Location: Home Provider Location: Clinic  Participants: Patient Location of Patient: Home Location of Provider: Clinic Consent was obtain for visit to be over via telehealth. I verified that I am speaking with the correct person using two identifiers.  PCP:  [name], FNP   Chief Complaint:  STD  History of Present Illness:    Jessica Sanchez is a 36 y.o. female with request to review STD results.  The patient does not have symptoms concerning for COVID-19 infection (fever, chills, cough, or new shortness of breath).   Past Medical, Surgical, Social History, Allergies, and Medications have been Reviewed.  Past Medical History:  Diagnosis Date   Angio-edema    Arthritis    right shoulder   Asthma    Dyspnea    Dysrhythmia    hx of SVT   Fibroid (bleeding) (uterine)    Fibroid, uterine    H/O metrorrhagia    Hypertension    IUD (intrauterine device) in place 07/11/2015   Menorrhagia    Obesity, Class III, BMI 40-49.9 (morbid obesity) ([place]) 11/11/2018   Sleep apnea    uses CPAP - setting 11   SVT (supraventricular tachycardia)    Past Surgical History:  Procedure Laterality Date   DILATATION AND CURETTAGE/HYSTEROSCOPY WITH MINERVA N/A 12/01/2021   Procedure: DILATATION AND CURETTAGE/HYSTEROSCOPY WITH MINERVA;  Surgeon: [name], MD;  [facility];  Service: Gynecology;  Laterality: N/A;   HYSTEROSCOPY N/A  09/17/2020   Procedure: HYSTEROSCOPY;  Surgeon: [name], MD;  [facility];  Service: Gynecology;  Laterality: N/A;   HYSTEROSCOPY WITH D & C N/A 09/27/2013   Procedure: DILATATION AND CURETTAGE /HYSTEROSCOPY and insertion of Mirena IUD ;  Surgeon: [name]. Harrington Challenger, MD;  Location: [facility];  Service: Gynecology;  Laterality: N/A;   IUD REMOVAL N/A 09/17/2020   Procedure: INTRAUTERINE DEVICE (IUD) REMOVAL (2);  Surgeon: [name], MD;  [facility];  Service: Gynecology;  Laterality: N/A;   MYOMECTOMY  02/03/2011   Procedure: MYOMECTOMY;  Surgeon: [name], MD;  [facility];  Service: Gynecology;  Laterality: N/A;   SVT ABLATION N/A 11/08/2018   Procedure: SVT ABLATION;  Surgeon: [name], MD;  Location: [place] [facility];  Service: Cardiovascular;  Laterality: N/A;   SVT ABLATION N/A 12/26/2020   Procedure: SVT ABLATION;  Surgeon: [name], MD;  Location: [facility];  Service: Cardiovascular;  Laterality: N/A;   TONSILLECTOMY     TYMPANOSTOMY TUBE PLACEMENT       No outpatient medications have been marked as taking for the 04/13/22 encounter (Office Visit) with [name], [place].     Allergies:   Shellfish allergy, Sulfa antibiotics, and Adhesive [tape]   ROS:   Please see the history of present illness.     All other systems reviewed and are negative.   Labs/Other Tests and Data Reviewed:    Recent Labs:  08/17/2021: B Natriuretic Peptide 12.0 01/13/2022: Magnesium 2.1 02/24/2022: ALT 13; BUN 9; Creatinine, Ser 0.65; Potassium 4.4; Sodium 136 03/02/2022: Hemoglobin 11.3; Platelets 387 03/30/2022: TSH 1.310   Recent Lipid Panel Lab Results  Component Value Date/Time   CHOL 135 03/30/2022 11:11 AM   TRIG 35 03/30/2022 11:11 AM   HDL 33 (L) 03/30/2022 11:11 AM   CHOLHDL 4.1 03/30/2022 11:11 AM   LDLCALC 93 03/30/2022 11:11 AM    Wt Readings from Last 3 Encounters:  04/08/22 (!) 342 lb (155.1 kg)  03/30/22 (!) 340 lb 1.9 oz  (154.3 kg)  03/04/22 (!) 337 lb 6.4 oz (153 kg)     Objective:    Vital Signs:  There were no vitals taken for this visit.     ASSESSMENT & PLAN:   STD testing Inform the patient that she was tested negative for trichomonas, gonorrhea and chlamydia Refill vitamin D weekly supplement, because her vitamin D is low  Time:   Today, I have spent 10 minutes reviewing the chart, including problem list, medications, and with the patient with telehealth technology discussing the above problems.   Medication Adjustments/Labs and Tests Ordered: Current medicines are reviewed at length with the patient today.  Concerns regarding medicines are outlined above.   Tests Ordered: No orders of the defined types were placed in this encounter.   Medication Changes: Meds ordered this encounter  Medications   Vitamin D, Ergocalciferol, (DRISDOL) 1.25 MG (50000 UNIT) CAPS capsule    Sig: Take 1 capsule (50,000 Units total) by mouth every 7 (seven) days.    Dispense:  7 capsule    Refill:  2     Note: This dictation was prepared with Dragon dictation along with smaller phrase technology. Similar sounding words can be transcribed inadequately or may not be corrected upon review. Any transcriptional errors that result from this process are unintentional.      Disposition:  Follow up  Signed, [name], FNP  04/13/2022 1:32 PM     [facility]

## 2022-04-20 ENCOUNTER — Encounter: Payer: Self-pay | Admitting: Obstetrics & Gynecology

## 2022-04-21 ENCOUNTER — Ambulatory Visit: Payer: 59 | Admitting: Pulmonary Disease

## 2022-04-24 MED ORDER — KETOROLAC TROMETHAMINE 10 MG PO TABS: 10.0000 mg | ORAL_TABLET | Freq: Three times a day (TID) | ORAL | 0 refills | Status: DC | PRN

## 2022-04-24 NOTE — Addendum Note (Signed)
Addended by: [name] on: 04/24/2022 11:25 AM   Modules accepted: Orders

## 2022-05-10 ENCOUNTER — Encounter: Payer: 59 | Admitting: Pulmonary Disease

## 2022-05-19 ENCOUNTER — Telehealth: Payer: Self-pay | Admitting: Family Medicine

## 2022-05-19 ENCOUNTER — Ambulatory Visit: Payer: 59 | Admitting: Family Medicine

## 2022-05-19 NOTE — Telephone Encounter (Signed)
Patietn called had to reschedule appt from today no ride pick her up.  Rescheduled to Dr patel 01.18.2024 at 11:20. Patient asked if nurse will call her very concern on the blood draining from the ear.  Call back # [phone].

## 2022-05-20 ENCOUNTER — Encounter: Payer: Self-pay | Admitting: Internal Medicine

## 2022-05-20 ENCOUNTER — Ambulatory Visit (INDEPENDENT_AMBULATORY_CARE_PROVIDER_SITE_OTHER): Payer: Self-pay | Admitting: Internal Medicine

## 2022-05-20 VITALS — BP 138/81 | HR 94 | Ht 62.0 in | Wt 348.0 lb

## 2022-05-20 DIAGNOSIS — H66005 Acute suppurative otitis media without spontaneous rupture of ear drum, recurrent, left ear: Secondary | ICD-10-CM

## 2022-05-20 MED ORDER — OFLOXACIN 0.3 % OT SOLN: 5.0000 [drp] | Freq: Every day | OTIC | 0 refills | Status: DC

## 2022-05-20 NOTE — Assessment & Plan Note (Addendum)
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

## 2022-05-20 NOTE — Progress Notes (Signed)
Acute Office Visit  Subjective:    Patient ID: Jessica Sanchez, female    DOB: [birth year], 37 y.o.   MRN: [id number]  Chief Complaint  Patient presents with   Ear Pain    Patient states her left ear has been hurting for five days, started draining two days ago, noticed blood in the drainage yesterday    HPI Patient is in today for complaint of left ear pain and serous discharge for the last 5 days.  She has had pain around the ear as well.  She had bloodstained serous discharge 2 days ago.  She denies any dizziness, but she has mild hearing deficit on the left ear.  Denies any fever, chills, nasal congestion, postnasal drip or sore throat.  She has history of recurrent ear infections as a child. She had ear tubes placed for it.  Past Medical History:  Diagnosis Date   Angio-edema    Arthritis    right shoulder   Asthma    Dyspnea    Dysrhythmia    hx of SVT   Fibroid (bleeding) (uterine)    Fibroid, uterine    H/O metrorrhagia    Hypertension    IUD (intrauterine device) in place 07/11/2015   Menorrhagia    Obesity, Class III, BMI 40-49.9 (morbid obesity) ([place]) 11/11/2018   Sleep apnea    uses CPAP - setting 11   SVT (supraventricular tachycardia)     Past Surgical History:  Procedure Laterality Date   DILATATION AND CURETTAGE/HYSTEROSCOPY WITH MINERVA N/A 12/01/2021   Procedure: DILATATION AND CURETTAGE/HYSTEROSCOPY WITH MINERVA;  Surgeon: [name], MD;  Location: [facility];  Service: Gynecology;  Laterality: N/A;   HYSTEROSCOPY N/A 09/17/2020   Procedure: HYSTEROSCOPY;  Surgeon: [name], MD;  Location: [facility];  Service: Gynecology;  Laterality: N/A;   HYSTEROSCOPY WITH D & C N/A 09/27/2013   Procedure: DILATATION AND CURETTAGE /HYSTEROSCOPY and insertion of Mirena IUD ;  Surgeon: [name]. Harrington Challenger, MD;  Location: [facility];  Service: Gynecology;  Laterality: N/A;   IUD REMOVAL N/A 09/17/2020   Procedure: INTRAUTERINE DEVICE (IUD) REMOVAL (2);  Surgeon: [name], MD;  Location: [facility];  Service: Gynecology;  Laterality: N/A;   MYOMECTOMY  02/03/2011   Procedure: MYOMECTOMY;  Surgeon: [name], MD;  Location: [facility];  Service: Gynecology;  Laterality: N/A;   SVT ABLATION N/A 11/08/2018   Procedure: SVT ABLATION;  Surgeon: [name], MD;  Location: [facility];  Service: Cardiovascular;  Laterality: N/A;   SVT ABLATION N/A 12/26/2020   Procedure: SVT ABLATION;  Surgeon: [name], MD;  Location: [facility];  Service: Cardiovascular;  Laterality: N/A;   TONSILLECTOMY     TYMPANOSTOMY TUBE PLACEMENT      Family History  Problem Relation Age of Onset   Cirrhosis Mother    Diabetes Mother    Cancer Maternal Grandfather    Hypertension Sister    Diabetes Sister    Anesthesia problems Neg Hx    Hypotension Neg Hx    Malignant hyperthermia Neg Hx    Pseudochol deficiency Neg Hx     Social History   Socioeconomic History   Marital status: Single    Spouse name: Not on file   Number of children: Not on file   Years of education: Not on file   Highest education level: Not on file  Occupational History   Not on file  Tobacco Use  Smoking status: Never   Smokeless tobacco: Never   Tobacco comments:    socially  Vaping Use   Vaping Use: Never used  Substance and Sexual Activity   Alcohol use: Yes    Comment: occasional   Drug use: Yes    Types: Marijuana    Comment: on occ   Sexual activity: Not Currently    Birth control/protection: Condom  Other Topics Concern   Not on file  Social History Narrative   Not on file   Social Determinants of Health   Financial Resource Strain: Not on file  Food Insecurity: Not on file  Transportation Needs: Not on file  Physical Activity: Not on file  Stress: Not on file  Social Connections: Not on file  Intimate Partner Violence: Not on file    Outpatient Medications Prior to Visit  Medication Sig Dispense Refill   albuterol (VENTOLIN HFA) 108 (90 Base)  MCG/ACT inhaler Inhale 2 puffs into the lungs every 6 (six) hours as needed for wheezing or shortness of breath. 8 g 0   dicyclomine (BENTYL) 20 MG tablet Take 1 tablet (20 mg total) by mouth 2 (two) times daily. (Patient not taking: Reported on 04/08/2022) 20 tablet 0   flecainide (TAMBOCOR) 100 MG tablet Take 150 mg by mouth 2 (two) times daily.     fluticasone furoate-vilanterol (BREO ELLIPTA) 100-25 MCG/ACT AEPB Inhale 1 puff into the lungs daily. 30 each 5   furosemide (LASIX) 20 MG tablet Take 1 tablet (20 mg total) by mouth daily. 30 tablet 0   ketorolac (TORADOL) 10 MG tablet Take 1 tablet (10 mg total) by mouth every 8 (eight) hours as needed. 15 tablet 0   levalbuterol (XOPENEX HFA) 45 MCG/ACT inhaler Inhale 2 puffs into the lungs every 6 (six) hours as needed for wheezing. 1 each 3   levocetirizine (XYZAL) 5 MG tablet Take 1 tablet (5 mg total) by mouth in the morning and at bedtime. 60 tablet 0   megestrol (MEGACE) 40 MG tablet Take 3 x 5 days then 2 x 5 days then 2 daily 90 tablet 0   montelukast (SINGULAIR) 10 MG tablet Take 1 tablet (10 mg total) by mouth at bedtime. 30 tablet 5   Multiple Vitamins-Calcium (ONE-A-DAY WOMENS PO) Take 1 tablet by mouth daily.     potassium chloride SA (KLOR-CON M) 20 MEQ tablet Take 1 tablet (20 mEq total) by mouth 2 (two) times daily. 40 tablet 0   Vitamin D, Ergocalciferol, (DRISDOL) 1.25 MG (50000 UNIT) CAPS capsule Take 1 capsule (50,000 Units total) by mouth every 7 (seven) days. 7 capsule 2   No facility-administered medications prior to visit.    Allergies  Allergen Reactions   Shellfish Allergy Shortness Of Breath, Itching and Swelling    Mouth became swollen, throat started itching, and developed shortness of breath   Sulfa Antibiotics Itching, Shortness Of Breath and Swelling    Mouth became swollen, throat started itching, and developed shortness of breath   Adhesive [Tape] Itching    Depends on the type of medical tape    Review of  Systems  Constitutional:  Positive for fatigue. Negative for chills and fever.  HENT:  Positive for ear discharge and ear pain. Negative for congestion, sinus pressure, sinus pain and sore throat.   Eyes:  Negative for pain and discharge.  Respiratory:  Negative for cough and shortness of breath.   Cardiovascular:  Negative for chest pain and palpitations.  Gastrointestinal:  Negative for abdominal pain,  diarrhea, nausea and vomiting.  Endocrine: Negative for polydipsia and polyuria.  Genitourinary:  Negative for dysuria and hematuria.  Musculoskeletal:  Negative for neck pain and neck stiffness.  Skin:  Negative for rash.  Neurological:  Negative for dizziness and weakness.  Psychiatric/Behavioral:  Negative for agitation and behavioral problems.        Objective:    Physical Exam Vitals reviewed.  Constitutional:      General: She is not in acute distress.    Appearance: She is obese. She is not diaphoretic.  HENT:     Head: Normocephalic and atraumatic.     Right Ear: No tenderness. No middle ear effusion. There is no impacted cerumen.     Left Ear: Drainage and tenderness present. A middle ear effusion is present. There is no impacted cerumen.     Nose: Nose normal.     Mouth/Throat:     Mouth: Mucous membranes are moist.  Eyes:     General: No scleral icterus.    Extraocular Movements: Extraocular movements intact.  Cardiovascular:     Rate and Rhythm: Normal rate and regular rhythm.     Pulses: Normal pulses.     Heart sounds: No murmur heard. Pulmonary:     Breath sounds: Normal breath sounds. No wheezing or rales.  Musculoskeletal:     Cervical back: Neck supple. No tenderness.  Skin:    General: Skin is warm.     Findings: No rash.  Neurological:     General: No focal deficit present.     Mental Status: She is alert and oriented to person, place, and time.  Psychiatric:        Mood and Affect: Mood normal.        Behavior: Behavior normal.     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    Dispense:  5 mL    Refill:  0     Bhavana Kady Keith Rake, MD

## 2022-05-20 NOTE — Telephone Encounter (Signed)
Patel seeing patient today

## 2022-05-24 ENCOUNTER — Ambulatory Visit: Payer: 59 | Admitting: Pulmonary Disease

## 2022-05-26 ENCOUNTER — Emergency Department (HOSPITAL_COMMUNITY): Payer: No Typology Code available for payment source

## 2022-05-26 ENCOUNTER — Other Ambulatory Visit: Payer: Self-pay

## 2022-05-26 ENCOUNTER — Encounter (HOSPITAL_COMMUNITY): Payer: Self-pay | Admitting: *Deleted

## 2022-05-26 ENCOUNTER — Emergency Department (HOSPITAL_COMMUNITY)
Admission: EM | Admit: 2022-05-26 | Discharge: 2022-05-26 | Disposition: A | Discharge status: Home / Self Care | Payer: No Typology Code available for payment source | Attending: Emergency Medicine | Admitting: Emergency Medicine

## 2022-05-26 ENCOUNTER — Other Ambulatory Visit (HOSPITAL_COMMUNITY): Payer: No Typology Code available for payment source

## 2022-05-26 DIAGNOSIS — R600 Localized edema: Secondary | ICD-10-CM | POA: Diagnosis not present

## 2022-05-26 DIAGNOSIS — Z7951 Long term (current) use of inhaled steroids: Secondary | ICD-10-CM | POA: Diagnosis not present

## 2022-05-26 DIAGNOSIS — Z79899 Other long term (current) drug therapy: Secondary | ICD-10-CM | POA: Diagnosis not present

## 2022-05-26 DIAGNOSIS — R079 Chest pain, unspecified: Secondary | ICD-10-CM

## 2022-05-26 DIAGNOSIS — J45909 Unspecified asthma, uncomplicated: Secondary | ICD-10-CM | POA: Insufficient documentation

## 2022-05-26 DIAGNOSIS — I10 Essential (primary) hypertension: Secondary | ICD-10-CM | POA: Diagnosis not present

## 2022-05-26 DIAGNOSIS — R791 Abnormal coagulation profile: Secondary | ICD-10-CM | POA: Insufficient documentation

## 2022-05-26 DIAGNOSIS — Z7901 Long term (current) use of anticoagulants: Secondary | ICD-10-CM | POA: Diagnosis not present

## 2022-05-26 LAB — TROPONIN I (HIGH SENSITIVITY)
Troponin I (High Sensitivity): <2 ng/L (ref <18)
Troponin I (High Sensitivity): <2 ng/L (ref <18)

## 2022-05-26 LAB — HEPATIC FUNCTION PANEL
ALT: 13 U/L (ref 0–44)
AST: 12 U/L — ABNORMAL LOW (ref 15–41)
Albumin: 3.3 g/dL — ABNORMAL LOW (ref 3.5–5.0)
Alkaline Phosphatase: 49 U/L (ref 38–126)
Bilirubin, Direct: <0.1 mg/dL (ref 0.0–0.2)
Total Bilirubin: 0.6 mg/dL (ref 0.3–1.2)
Total Protein: 6.8 g/dL (ref 6.5–8.1)

## 2022-05-26 LAB — CBG MONITORING, ED: Glucose-Capillary: 96 mg/dL (ref 70–99)

## 2022-05-26 LAB — D-DIMER, QUANTITATIVE: D-Dimer, Quant: 0.88 ug/mL-FEU — ABNORMAL HIGH (ref 0.00–0.50)

## 2022-05-26 LAB — PROTIME-INR
INR: 1 (ref 0.8–1.2)
Prothrombin Time: 13.2 seconds (ref 11.4–15.2)

## 2022-05-26 LAB — BASIC METABOLIC PANEL
Anion gap: 8 (ref 5–15)
BUN: 16 mg/dL (ref 6–20)
CO2: 25 mmol/L (ref 22–32)
Calcium: 8.9 mg/dL (ref 8.9–10.3)
Chloride: 103 mmol/L (ref 98–111)
Creatinine, Ser: 0.7 mg/dL (ref 0.44–1.00)
GFR, Estimated: >60 mL/min (ref >60)
Glucose, Bld: 92 mg/dL (ref 70–99)
Potassium: 3.6 mmol/L (ref 3.5–5.1)
Sodium: 136 mmol/L (ref 135–145)

## 2022-05-26 LAB — CBC
HCT: 36.7 % (ref 36.0–46.0)
Hemoglobin: 11.5 g/dL — ABNORMAL LOW (ref 12.0–15.0)
MCH: 26.5 pg (ref 26.0–34.0)
MCHC: 31.3 g/dL (ref 30.0–36.0)
MCV: 84.6 fL (ref 80.0–100.0)
Platelets: 339 10*3/uL (ref 150–400)
RBC: 4.34 MIL/uL (ref 3.87–5.11)
RDW: 15.5 % (ref 11.5–15.5)
WBC: 5.9 10*3/uL (ref 4.0–10.5)
nRBC: 0 % (ref 0.0–0.2)

## 2022-05-26 LAB — LIPASE, BLOOD: Lipase: 29 U/L (ref 11–51)

## 2022-05-26 LAB — POC URINE PREG, ED: Preg Test, Ur: NEGATIVE

## 2022-05-26 LAB — MAGNESIUM: Magnesium: 2 mg/dL (ref 1.7–2.4)

## 2022-05-26 MED ORDER — FAMOTIDINE 20 MG PO TABS: 20.0000 mg | ORAL_TABLET | Freq: Every day | ORAL | 0 refills | Status: DC

## 2022-05-26 MED ORDER — ALUM & MAG HYDROXIDE-SIMETH 200-200-20 MG/5ML PO SUSP
30.0000 mL | Freq: Once | ORAL | Status: AC
  Administered 2022-05-26: 30 mL via ORAL
  Filled 2022-05-26: qty 30

## 2022-05-26 MED ORDER — KETOROLAC TROMETHAMINE 15 MG/ML IJ SOLN
15.0000 mg | Freq: Once | INTRAMUSCULAR | Status: AC
  Administered 2022-05-26: 15 mg via INTRAVENOUS
  Filled 2022-05-26: qty 1

## 2022-05-26 MED ORDER — IOHEXOL 350 MG/ML SOLN
80.0000 mL | Freq: Once | INTRAVENOUS | Status: AC | PRN
  Administered 2022-05-26: 80 mL via INTRAVENOUS

## 2022-05-26 MED ORDER — OXYCODONE-ACETAMINOPHEN 5-325 MG PO TABS
1.0000 | ORAL_TABLET | Freq: Once | ORAL | Status: AC
  Administered 2022-05-26: 1 via ORAL
  Filled 2022-05-26: qty 1

## 2022-05-26 MED ORDER — NITROGLYCERIN 2 % TD OINT
1.0000 [in_us] | TOPICAL_OINTMENT | Freq: Once | TRANSDERMAL | Status: AC
  Administered 2022-05-26: 1 [in_us] via TOPICAL
  Filled 2022-05-26: qty 1

## 2022-05-26 MED ORDER — MORPHINE SULFATE (PF) 4 MG/ML IV SOLN
4.0000 mg | Freq: Once | INTRAVENOUS | Status: AC
  Administered 2022-05-26: 4 mg via INTRAVENOUS
  Filled 2022-05-26: qty 1

## 2022-05-26 MED ORDER — LIDOCAINE VISCOUS HCL 2 % MT SOLN
15.0000 mL | Freq: Once | OROMUCOSAL | Status: AC
  Administered 2022-05-26: 15 mL via ORAL
  Filled 2022-05-26: qty 15

## 2022-05-26 MED ORDER — FAMOTIDINE IN NACL 20-0.9 MG/50ML-% IV SOLN
20.0000 mg | Freq: Once | INTRAVENOUS | Status: AC
  Administered 2022-05-26: 20 mg via INTRAVENOUS
  Filled 2022-05-26: qty 50

## 2022-05-26 MED ORDER — ASPIRIN 81 MG PO CHEW
324.0000 mg | CHEWABLE_TABLET | Freq: Once | ORAL | Status: AC
  Administered 2022-05-26: 324 mg via ORAL
  Filled 2022-05-26: qty 4

## 2022-05-26 NOTE — ED Triage Notes (Signed)
Pt is here for left sided chest pain which began at 5am.  Pt has pain radiating to left arm and left shoulder.  Pt has some sob with this.

## 2022-05-26 NOTE — Discharge Instructions (Signed)
A prescription for medication that lower stomach acid content was sent to your pharmacy.  This can improve symptoms of chest pain that are related to reflux.  Take daily as prescribed.  If symptoms do improve, talk to your primary care doctor about long-term use.  You should hear from the cardiology office in the next few days for a follow-up appointment scheduling.  If you do not hear from them, call the number below.  Return to the emergency department for any new or worsening symptoms of concern.

## 2022-05-26 NOTE — ED Provider Notes (Signed)
[facility] Provider Note   CSN: [id number] Arrival date & time: 05/26/22  1425     History  Chief Complaint  Patient presents with   Chest Pain    Jessica Sanchez is a 37 y.o. female.   Chest Pain Associated symptoms: shortness of breath   Patient presents for shortness of breath.  Medical history includes SVT, OSA, HTN, arthritis, asthma, obesity.  Onset of chest pain was 4:30 AM.  It did wake her up from sleep.  She initially felt some heart racing.  This resolved but she has continued to have a squeezing sensation in the left side of her chest.  She endorses very mild associated shortness of breath.  She has not had any nausea.  Pain has persisted throughout the day.  While at work, became severe, 10 chest in severity.  For this reason, she presented to the ED.  Currently is 9/10 in severity.  It is worsened with deep inspiration.  Pain does not radiate but she will feel what she describes as an abnormal sensation in her left upper arm.  She is followed by [facility] for her history of SVT.  She has undergone multiple ablations.  She was last seen by cardiology over 1 year ago.  Patient does not smoke.  Her mother did have an early MI.  Her mother passed away in her 77s.     Home Medications Prior to Admission medications   Medication Sig Start Date End Date Taking? Authorizing Provider  albuterol (VENTOLIN HFA) 108 (90 Base) MCG/ACT inhaler Inhale 2 puffs into the lungs every 6 (six) hours as needed for wheezing or shortness of breath. 10/26/21  Yes [name], FNP  famotidine (PEPCID) 20 MG tablet Take 1 tablet (20 mg total) by mouth daily. 05/26/22  Yes Godfrey Pick, MD  fluticasone furoate-vilanterol (BREO ELLIPTA) 100-25 MCG/ACT AEPB Inhale 1 puff into the lungs daily. 01/26/22 07/25/22 Yes [name], FNP  furosemide (LASIX) 20 MG tablet Take 1 tablet (20 mg total) by mouth daily. 11/30/21  Yes [name], FNP  ibuprofen (ADVIL)  600 MG tablet Take 600 mg by mouth every 6 (six) hours as needed for moderate pain.   Yes [provider]  levocetirizine (XYZAL) 5 MG tablet Take 1 tablet (5 mg total) by mouth in the morning and at bedtime. 02/03/22 05/26/22 Yes Ambs, Kathrine Cords, FNP  montelukast (SINGULAIR) 10 MG tablet Take 1 tablet (10 mg total) by mouth at bedtime. 02/03/22  Yes Ambs, Kathrine Cords, FNP  Multiple Vitamins-Calcium (ONE-A-DAY WOMENS PO) Take 1 tablet by mouth daily.   Yes [provider]  ofloxacin (FLOXIN) 0.3 % OTIC solution Place 5 drops into the left ear daily. 05/20/22  Yes Lindell Spar, MD  Vitamin D, Ergocalciferol, (DRISDOL) 1.25 MG (50000 UNIT) CAPS capsule Take 1 capsule (50,000 Units total) by mouth every 7 (seven) days. 04/13/22  Yes [name], FNP  dicyclomine (BENTYL) 20 MG tablet Take 1 tablet (20 mg total) by mouth 2 (two) times daily. Patient not taking: Reported on 04/08/2022 02/24/22   [name], PA-C  ketorolac (TORADOL) 10 MG tablet Take 1 tablet (10 mg total) by mouth every 8 (eight) hours as needed. Patient not taking: Reported on 05/26/2022 04/24/22   [name], MD  levalbuterol [facility] HFA) 45 MCG/ACT inhaler Inhale 2 puffs into the lungs every 6 (six) hours as needed for wheezing. 02/03/22   Dara Hoyer, FNP  potassium chloride  SA (KLOR-CON M) 20 MEQ tablet Take 1 tablet (20 mEq total) by mouth 2 (two) times daily. Patient not taking: Reported on 05/26/2022 11/18/21   Fayrene Helper, MD  apixaban (ELIQUIS) 5 MG TABS tablet Take 2 tablets ('10mg'$ ) twice daily for 7 days, then 1 tablet ('5mg'$ ) twice daily 04/20/19 04/20/19  Rodell Perna A, PA-C  levonorgestrel (MIRENA) 20 MCG/24HR IUD 1 each by Intrauterine route once.   06/25/20  [provider]      Allergies    Shellfish allergy, Sulfa antibiotics, and Adhesive [tape]    Review of Systems   Review of Systems  Respiratory:  Positive for shortness of breath.   Cardiovascular:  Positive for chest pain.  All  other systems reviewed and are negative.   Physical Exam Updated Vital Signs BP 104/69   Pulse 93   Temp 98.3 F (36.8 C)   Resp 17   Wt (!) 157.9 kg   SpO2 97%   BMI 63.65 kg/m  Physical Exam Vitals and nursing note reviewed.  Constitutional:      General: She is not in acute distress.    Appearance: She is well-developed. She is obese. She is not ill-appearing, toxic-appearing or diaphoretic.  HENT:     Head: Normocephalic and atraumatic.  Eyes:     Conjunctiva/sclera: Conjunctivae normal.  Cardiovascular:     Rate and Rhythm: Normal rate and regular rhythm.     Heart sounds: No murmur heard.    No friction rub. No gallop.  Pulmonary:     Effort: Pulmonary effort is normal. No respiratory distress.     Breath sounds: Normal breath sounds. No decreased breath sounds, wheezing, rhonchi or rales.  Chest:     Chest wall: No tenderness.  Abdominal:     Palpations: Abdomen is soft.     Tenderness: There is no abdominal tenderness.  Musculoskeletal:        General: No swelling.     Cervical back: Normal range of motion and neck supple.     Right lower leg: Edema present.     Left lower leg: Edema present.  Skin:    General: Skin is warm and dry.     Capillary Refill: Capillary refill takes less than 2 seconds.     Coloration: Skin is not cyanotic or pale.  Neurological:     General: No focal deficit present.     Mental Status: She is alert and oriented to person, place, and time.  Psychiatric:        Mood and Affect: Mood normal.        Behavior: Behavior normal.     ED Results / Procedures / Treatments   Labs (all labs ordered are listed, but only abnormal results are displayed) Labs Reviewed  CBC - Abnormal; Notable for the following components:      Result Value   Hemoglobin 11.5 (*)    All other components within normal limits  D-DIMER, QUANTITATIVE - Abnormal; Notable for the following components:   D-Dimer, Quant 0.88 (*)    All other components within  normal limits  HEPATIC FUNCTION PANEL - Abnormal; Notable for the following components:   Albumin 3.3 (*)    AST 12 (*)    All other components within normal limits  BASIC METABOLIC PANEL  MAGNESIUM  LIPASE, BLOOD  PROTIME-INR  POC URINE PREG, ED  CBG MONITORING, ED  TROPONIN I (HIGH SENSITIVITY)  TROPONIN I (HIGH SENSITIVITY)    EKG EKG Interpretation  Date/Time:  Wednesday May 26 2022 14:45:17 EST Ventricular Rate:  86 PR Interval:  143 QRS Duration: 105 QT Interval:  387 QTC Calculation: 463 R Axis:   36 Text Interpretation: Sinus rhythm Low voltage, extremity and precordial leads Abnormal R-wave progression, early transition Confirmed by Godfrey Pick [phone]) on 05/26/2022 3:02:57 PM  Radiology CT Angio Chest PE W and/or Wo Contrast  Result Date: 05/26/2022 CLINICAL DATA:  Positive D-dimer. Left-sided chest pain starting earlier today. EXAM: CT ANGIOGRAPHY CHEST WITH CONTRAST TECHNIQUE: Multidetector CT imaging of the chest was performed using the standard protocol during bolus administration of intravenous contrast. Multiplanar CT image reconstructions and MIPs were obtained to evaluate the vascular anatomy. RADIATION DOSE REDUCTION: This exam was performed according to the departmental dose-optimization program which includes automated exposure control, adjustment of the mA and/or kV according to patient size and/or use of iterative reconstruction technique. CONTRAST:  74m OMNIPAQUE IOHEXOL 350 MG/ML SOLN COMPARISON:  CT angiogram chest 01/13/2022. Older exams as well. X-ray 05/26/2022 earlier and older FINDINGS: Cardiovascular: Heart is slightly enlarged. No pericardial effusion. Grossly normal caliber thoracic aorta. Pulsation artifact identified. No mediastinal hematoma. There is significant motion throughout the examination including breathing motion. This can limit evaluation of small and peripheral emboli. No large or central embolus identified. Mediastinum/Nodes: No  specific abnormal lymph node enlargement identified in the axillary regions, hilum or mediastinum. Normal caliber thoracic esophagus. Lungs/Pleura: Lungs are without obvious consolidation, pneumothorax or effusion. Significant motion. Upper Abdomen: Adrenal glands are preserved in the upper abdomen. Fatty liver infiltration. Musculoskeletal: Mild degenerative changes along the spine. Review of the MIP images confirms the above findings. IMPRESSION: Study significantly limited by motion. No large or central embolus. Evaluation of small and peripheral emboli are limited. Mild cardiomegaly. Fatty liver infiltration. Electronically Signed   By: AJill SideM.D.   On: 05/26/2022 18:30   UK[place]Venous Img Lower Bilateral  Result Date: 05/26/2022 CLINICAL DATA:  Leg swelling EXAM: BILATERAL LOWER EXTREMITY VENOUS DOPPLER ULTRASOUND TECHNIQUE: Gray-scale sonography with graded compression, as well as color Doppler and duplex ultrasound were performed to evaluate the lower extremity deep venous systems from the level of the common femoral vein and including the common femoral, femoral, profunda femoral, popliteal and calf veins including the posterior tibial, peroneal and gastrocnemius veins when visible. The superficial great saphenous vein was also interrogated. Spectral Doppler was utilized to evaluate flow at rest and with distal augmentation maneuvers in the common femoral, femoral and popliteal veins. COMPARISON:  None Available. FINDINGS: RIGHT LOWER EXTREMITY Common Femoral Vein: No evidence of thrombus. Normal compressibility, respiratory phasicity and response to augmentation. Saphenofemoral Junction: No evidence of thrombus. Normal compressibility and flow on color Doppler imaging. Profunda Femoral Vein: No evidence of thrombus. Normal compressibility and flow on color Doppler imaging. Femoral Vein: No evidence of thrombus. Normal compressibility, respiratory phasicity and response to augmentation. Popliteal  Vein: No evidence of thrombus. Normal compressibility, respiratory phasicity and response to augmentation. Calf Veins: No evidence of thrombus. Normal compressibility and flow on color Doppler imaging. Superficial Great Saphenous Vein: No evidence of thrombus. Normal compressibility. Venous Reflux:  None. Other Findings:  None. LEFT LOWER EXTREMITY Common Femoral Vein: No evidence of thrombus. Normal compressibility, respiratory phasicity and response to augmentation. Saphenofemoral Junction: No evidence of thrombus. Normal compressibility and flow on color Doppler imaging. Profunda Femoral Vein: No evidence of thrombus. Normal compressibility and flow on color Doppler imaging. Femoral Vein: No evidence of thrombus. Normal compressibility, respiratory phasicity and response to augmentation. Popliteal Vein: No  evidence of thrombus. Normal compressibility, respiratory phasicity and response to augmentation. Calf Veins: No evidence of thrombus. Normal compressibility and flow on color Doppler imaging. Superficial Great Saphenous Vein: No evidence of thrombus. Normal compressibility. Venous Reflux:  None. Other Findings:  None. IMPRESSION: No evidence of deep venous thrombosis in either lower extremity. Electronically Signed   By: Ronney Asters M.D.   On: 05/26/2022 17:49   DG Chest Port 1 View  Result Date: 05/26/2022 CLINICAL DATA:  Chest pain EXAM: PORTABLE CHEST 1 VIEW COMPARISON:  Chest x-ray 01/13/2022 FINDINGS: The heart size and mediastinal contours are within normal limits. Both lungs are clear. The visualized skeletal structures are unremarkable. IMPRESSION: No active disease. Electronically Signed   By: Ronney Asters M.D.   On: 05/26/2022 16:06    Procedures Procedures    Medications Ordered in ED Medications  oxyCODONE-acetaminophen (PERCOCET/ROXICET) 5-325 MG per tablet 1 tablet (has no administration in time range)  aspirin chewable tablet 324 mg (324 mg Oral Given 05/26/22 1538)   nitroGLYCERIN (NITROGLYN) 2 % ointment 1 inch (1 inch Topical Given 05/26/22 1542)  morphine (PF) 4 MG/ML injection 4 mg (4 mg Intravenous Given 05/26/22 1539)  iohexol (OMNIPAQUE) 350 MG/ML injection 80 mL (80 mLs Intravenous Contrast Given 05/26/22 1806)  ketorolac (TORADOL) 15 MG/ML injection 15 mg (15 mg Intravenous Given 05/26/22 1953)  alum & mag hydroxide-simeth (MAALOX/MYLANTA) 200-200-20 MG/5ML suspension 30 mL (30 mLs Oral Given 05/26/22 1953)    And  lidocaine (XYLOCAINE) 2 % viscous mouth solution 15 mL (15 mLs Oral Given 05/26/22 1953)  famotidine (PEPCID) IVPB 20 mg premix (0 mg Intravenous Stopped 05/26/22 2200)    ED Course/ Medical Decision Making/ A&P                             Medical Decision Making Amount and/or Complexity of Data Reviewed Labs: ordered. Radiology: ordered.  Risk OTC drugs. Prescription drug management.   This patient presents to the ED for concern of chest pain, this involves an extensive number of treatment options, and is a complaint that carries with it a high risk of complications and morbidity.  The differential diagnosis includes ACS, pericarditis, costochondritis, GERD, PE, pneumonia   Co morbidities that complicate the patient evaluation  SVT, OSA, HTN, arthritis, asthma, obesity   Additional history obtained:  Additional history obtained from N/A External records from outside source obtained and reviewed including EMR   Lab Tests:  I Ordered, and personally interpreted labs.  The pertinent results include: Normal hemoglobin, no leukocytosis, normal electrolytes, normal troponins x 2, mild elevation in D-dimer, normal lipase   Imaging Studies ordered:  I ordered imaging studies including chest x-ray, DVT studies, CTA chest I independently visualized and interpreted imaging which showed no acute findings I agree with the radiologist interpretation   Cardiac Monitoring: / EKG:  The patient was maintained on a cardiac  monitor.  I personally viewed and interpreted the cardiac monitored which showed an underlying rhythm of: Sinus rhythm   Problem List / ED Course / Critical interventions / Medication management  Patient presenting for chest pain.  Pain has been present throughout the day.  Onset was 4:30 AM, when he woke up from sleep.  It has been severe throughout the day.  She currently Dors is 9/10 severity.  She has had mild associated shortness of breath.  On arrival, heart rate and blood pressure are normal.  EKG does not show any concerning  ST segment findings.  On assessment, she is well-appearing.  She endorses worsened pain with deep inspiration.  She has no tenderness.  Her breathing is currently unlabored.  She does have swelling in her bilateral lower extremities which she states is chronic.  324 ASA was ordered.  Morphine was ordered for analgesia.  Diagnostic workup was initiated.  Workup is also reassuring.  Following normal troponin, patient was given Toradol and GI cocktail.  She did report improvement following GI cocktail.  She is agreeable to start PPI for empiric treatment of GERD etiology.  She underwent DVT studies and CTA chest which were negative for acute findings.  Cardiology referral was ordered.  Patient is stable for discharge. I ordered medication including ASA, morphine, NTG for chest pain; Toradol and GI cocktail for persistent symptoms Reevaluation of the patient after these medicines showed that the patient improved I have reviewed the patients home medicines and have made adjustments as needed   Social Determinants of Health:  Has access to outpatient care         Final Clinical Impression(s) / ED Diagnoses Final diagnoses:  Chest pain, unspecified type    Rx / DC Orders ED Discharge Orders          Ordered    Ambulatory referral to Cardiology       Comments: If you have not heard from the Cardiology office within the next 72 hours please call [phone].    05/26/22 2249    famotidine (PEPCID) 20 MG tablet  Daily        05/26/22 2249              Godfrey Pick, MD 05/26/22 2250

## 2022-06-02 ENCOUNTER — Ambulatory Visit: Payer: No Typology Code available for payment source | Attending: Pulmonary Disease | Admitting: Pulmonary Disease

## 2022-06-02 DIAGNOSIS — G4733 Obstructive sleep apnea (adult) (pediatric): Secondary | ICD-10-CM | POA: Diagnosis not present

## 2022-06-02 MED ORDER — MYFEMBREE 40-1-0.5 MG PO TABS: 1.0000 | ORAL_TABLET | Freq: Every day | ORAL | 11 refills | Status: DC

## 2022-06-02 NOTE — Addendum Note (Signed)
Addended by: [name] on: 06/02/2022 11:09 AM   Modules accepted: Orders

## 2022-06-04 DIAGNOSIS — G4733 Obstructive sleep apnea (adult) (pediatric): Secondary | ICD-10-CM | POA: Diagnosis not present

## 2022-06-04 NOTE — Procedures (Signed)
Patient Name: Jessica Sanchez, Jessica Sanchez Date: 06/02/2022 Gender: Female D.O.B: 21-Oct-1985 Age (years): 36 Referring Provider: [name] Height (inches): 36 Interpreting Physician: Kara Mead MD, ABSM Weight (lbs): 348 RPSGT: Rosebud Poles BMI: 64 MRN: [id number] Neck Size: 17.50 <br> <br> CLINICAL INFORMATION Sleep Study Type: Split Night CPAP    Indication for sleep study: reassessment of OSA after weight gain 01/2019 split study -299 pounds -severe OSA, AHI 73/hour, lowest desaturation 75% , corrected by CPAP 11 cm with a small F20 fullface mask    Epworth Sleepiness Score: 8  SLEEP STUDY TECHNIQUE As per the AASM Manual for the Scoring of Sleep and Associated Events v2.3 (April 2016) with a hypopnea requiring 4% desaturations.  The channels recorded and monitored were frontal, central and occipital EEG, electrooculogram (EOG), submentalis EMG (chin), nasal and oral airflow, thoracic and abdominal wall motion, anterior tibialis EMG, snore microphone, electrocardiogram, and pulse oximetry. Continuous positive airway pressure (CPAP) was initiated when the patient met split night criteria and was titrated according to treat sleep-disordered breathing.  MEDICATIONS Medications self-administered by patient taken the night of the study : N/A  RESPIRATORY PARAMETERS Diagnostic  Total AHI (/hr): 77.4 RDI (/hr): 77.4 OA Index (/hr): 3.5 CA Index (/hr): 0.0 REM AHI (/hr): 176.5 NREM AHI (/hr): 71.6 Supine AHI (/hr): N/A Non-supine AHI (/hr): 77.4 Min O2 Sat (%): 71.00 Mean O2 (%): 90.99 Time below 88% (min): 27.1   Titration  Optimal Pressure (cm): 10 AHI at Optimal Pressure (/hr): 0 Min O2 at Optimal Pressure (%): 90.0 Supine % at Optimal (%): 0 Sleep % at Optimal (%): 99   SLEEP ARCHITECTURE The recording time for the entire night was 428 minutes.  During a baseline period of 185.4 minutes, the patient slept for 153.5 minutes in REM and nonREM, yielding a sleep efficiency of  82.8%. Sleep onset after lights out was 13.3 minutes with a REM latency of 85.5 minutes. The patient spent 4.56% of the night in stage N1 sleep, 69.06% in stage N2 sleep, 20.85% in stage N3 and 5.5% in REM.    During the titration period of 229.5 minutes, the patient slept for 169.0 minutes in REM and nonREM, yielding a sleep efficiency of 73.6%. Sleep onset after CPAP initiation was 33.8 minutes with a REM latency of 40.5 minutes. The patient spent 3.85% of the night in stage N1 sleep, 35.80% in stage N2 sleep, 18.93% in stage N3 and 41.4% in REM.  CARDIAC DATA The 2 lead EKG demonstrated sinus rhythm. The mean heart rate was 85.60 beats per minute. Other EKG findings include: None.   LEG MOVEMENT DATA The total Periodic Limb Movements of Sleep (PLMS) were 0. The PLMS index was 0.00 .  IMPRESSIONS - Severe obstructive sleep apnea occurred during the diagnostic portion of the study (AHI = 77.4/hour). An optimal PAP pressure was selected for this patient ( 10 cm of water) - Moderate oxygen desaturation was noted during the diagnostic portion of the study (Min O2 =71.00%). - The patient snored with loud snoring volume during the diagnostic portion of the study. - No cardiac abnormalities were noted during this study. - Clinically significant periodic limb movements did not occur during sleep.   DIAGNOSIS - Obstructive Sleep Apnea (G47.33)   RECOMMENDATIONS - Trial of CPAP therapy on 10 cm H2O with a Medium size Fisher&Paykel Full Face Simplus mask and heated humidification. - Avoid alcohol, sedatives and other CNS depressants that may worsen sleep apnea and disrupt normal sleep architecture. - Sleep hygiene should be  reviewed to assess factors that may improve sleep quality. - Weight management and regular exercise should be initiated or continued. - Return to [facility] Center for re-evaluation after 4 weeks of therapy    Kara Mead MD Board Certified in [place]

## 2022-06-07 ENCOUNTER — Emergency Department (HOSPITAL_COMMUNITY)
Admission: EM | Admit: 2022-06-07 | Discharge: 2022-06-07 | Disposition: A | Discharge status: Home / Self Care | Payer: No Typology Code available for payment source | Attending: Emergency Medicine | Admitting: Emergency Medicine

## 2022-06-07 ENCOUNTER — Other Ambulatory Visit: Payer: Self-pay

## 2022-06-07 ENCOUNTER — Emergency Department (HOSPITAL_COMMUNITY): Payer: No Typology Code available for payment source

## 2022-06-07 ENCOUNTER — Telehealth: Payer: Self-pay | Admitting: Internal Medicine

## 2022-06-07 DIAGNOSIS — Z7901 Long term (current) use of anticoagulants: Secondary | ICD-10-CM | POA: Diagnosis not present

## 2022-06-07 DIAGNOSIS — J45909 Unspecified asthma, uncomplicated: Secondary | ICD-10-CM | POA: Diagnosis not present

## 2022-06-07 DIAGNOSIS — Z7951 Long term (current) use of inhaled steroids: Secondary | ICD-10-CM | POA: Insufficient documentation

## 2022-06-07 DIAGNOSIS — D649 Anemia, unspecified: Secondary | ICD-10-CM | POA: Insufficient documentation

## 2022-06-07 DIAGNOSIS — R0789 Other chest pain: Secondary | ICD-10-CM | POA: Diagnosis not present

## 2022-06-07 DIAGNOSIS — G4733 Obstructive sleep apnea (adult) (pediatric): Secondary | ICD-10-CM

## 2022-06-07 DIAGNOSIS — R079 Chest pain, unspecified: Secondary | ICD-10-CM

## 2022-06-07 DIAGNOSIS — R0602 Shortness of breath: Secondary | ICD-10-CM | POA: Diagnosis not present

## 2022-06-07 DIAGNOSIS — M7989 Other specified soft tissue disorders: Secondary | ICD-10-CM | POA: Diagnosis not present

## 2022-06-07 LAB — CBC
HCT: 35.9 % — ABNORMAL LOW (ref 36.0–46.0)
Hemoglobin: 11.3 g/dL — ABNORMAL LOW (ref 12.0–15.0)
MCH: 26.3 pg (ref 26.0–34.0)
MCHC: 31.5 g/dL (ref 30.0–36.0)
MCV: 83.5 fL (ref 80.0–100.0)
Platelets: 301 10*3/uL (ref 150–400)
RBC: 4.3 MIL/uL (ref 3.87–5.11)
RDW: 15.4 % (ref 11.5–15.5)
WBC: 5 10*3/uL (ref 4.0–10.5)
nRBC: 0 % (ref 0.0–0.2)

## 2022-06-07 LAB — BASIC METABOLIC PANEL
Anion gap: 8 (ref 5–15)
BUN: 11 mg/dL (ref 6–20)
CO2: 24 mmol/L (ref 22–32)
Calcium: 8.7 mg/dL — ABNORMAL LOW (ref 8.9–10.3)
Chloride: 105 mmol/L (ref 98–111)
Creatinine, Ser: 0.6 mg/dL (ref 0.44–1.00)
GFR, Estimated: >60 mL/min (ref >60)
Glucose, Bld: 100 mg/dL — ABNORMAL HIGH (ref 70–99)
Potassium: 3.9 mmol/L (ref 3.5–5.1)
Sodium: 137 mmol/L (ref 135–145)

## 2022-06-07 LAB — TROPONIN I (HIGH SENSITIVITY)
Troponin I (High Sensitivity): <2 ng/L (ref <18)
Troponin I (High Sensitivity): <2 ng/L (ref <18)

## 2022-06-07 LAB — BRAIN NATRIURETIC PEPTIDE: B Natriuretic Peptide: 37 pg/mL (ref 0.0–100.0)

## 2022-06-07 MED ORDER — KETOROLAC TROMETHAMINE 30 MG/ML IJ SOLN
15.0000 mg | Freq: Once | INTRAMUSCULAR | Status: AC
  Administered 2022-06-07: 15 mg via INTRAMUSCULAR
  Filled 2022-06-07: qty 1

## 2022-06-07 NOTE — Telephone Encounter (Signed)
Spoke with the patient who reports that her heart rate has been elevated since yesterday. Today she woke up with chest pain. She reports that it feels like an elephant is sitting on her chest. She states that she has pain radiating in her neck and shoulder. She is also dizzy. Advised patient to go to the ER for evaluation. Patient agreeable to plan.

## 2022-06-07 NOTE — ED Provider Notes (Signed)
[facility] Provider Note   CSN: [id number] Arrival date & time: 06/07/22  1324     History  Chief Complaint  Patient presents with   Chest Pain    Jessica Sanchez is a 37 y.o. female.   Chest Pain   37 year old female presents emergency department with complaints of chest pain.  Patient reports centralized chest pain beginning this morning after awakening.  Describes pain as pressure without radiation.  States she has had a history of similar symptoms in the past which she is been evaluated for in the emergency department.  Describes pain currently as similar episodes in the past.  Reports some feelings of shortness of breath associated.  Denies fever, cough, congestion, abdominal pain, nausea, vomiting, history of DVT/PE, recent surgery/immobilization, known malignancy, current hormonal therapy.  Past medical history significant for SVT, elevated D-dimer, OSA, peripheral edema, obesity, asthma  Home Medications Prior to Admission medications   Medication Sig Start Date End Date Taking? Authorizing Provider  albuterol (VENTOLIN HFA) 108 (90 Base) MCG/ACT inhaler Inhale 2 puffs into the lungs every 6 (six) hours as needed for wheezing or shortness of breath. 10/26/21   [name], FNP  dicyclomine (BENTYL) 20 MG tablet Take 1 tablet (20 mg total) by mouth 2 (two) times daily. Patient not taking: Reported on 04/08/2022 02/24/22   [name], PA-C  famotidine (PEPCID) 20 MG tablet Take 1 tablet (20 mg total) by mouth daily. 05/26/22   Godfrey Pick, MD  fluticasone furoate-vilanterol (BREO ELLIPTA) 100-25 MCG/ACT AEPB Inhale 1 puff into the lungs daily. 01/26/22 07/25/22  [name], FNP  furosemide (LASIX) 20 MG tablet Take 1 tablet (20 mg total) by mouth daily. 11/30/21   [name], FNP  ibuprofen (ADVIL) 600 MG tablet Take 600 mg by mouth every 6 (six) hours as needed for moderate pain.    [provider]  ketorolac  (TORADOL) 10 MG tablet Take 1 tablet (10 mg total) by mouth every 8 (eight) hours as needed. Patient not taking: Reported on 05/26/2022 04/24/22   [name], MD  levalbuterol [facility] HFA) 45 MCG/ACT inhaler Inhale 2 puffs into the lungs every 6 (six) hours as needed for wheezing. 02/03/22   Ambs, Kathrine Cords, FNP  levocetirizine (XYZAL) 5 MG tablet Take 1 tablet (5 mg total) by mouth in the morning and at bedtime. 02/03/22 05/26/22  Dara Hoyer, FNP  montelukast (SINGULAIR) 10 MG tablet Take 1 tablet (10 mg total) by mouth at bedtime. 02/03/22   Dara Hoyer, FNP  Multiple Vitamins-Calcium (ONE-A-DAY WOMENS PO) Take 1 tablet by mouth daily.    [provider]  ofloxacin (FLOXIN) 0.3 % OTIC solution Place 5 drops into the left ear daily. 05/20/22   Lindell Spar, MD  potassium chloride SA (KLOR-CON M) 20 MEQ tablet Take 1 tablet (20 mEq total) by mouth 2 (two) times daily. Patient not taking: Reported on 05/26/2022 11/18/21   Fayrene Helper, MD  Relugolix-Estradiol-Norethind (MYFEMBREE) 40-1-0.5 MG TABS Take 1 tablet by mouth daily. 06/02/22   [name], MD  Vitamin D, Ergocalciferol, (DRISDOL) 1.25 MG (50000 UNIT) CAPS capsule Take 1 capsule (50,000 Units total) by mouth every 7 (seven) days. 04/13/22   [name], FNP  apixaban (ELIQUIS) 5 MG TABS tablet Take 2 tablets ('10mg'$ ) twice daily for 7 days, then 1 tablet ('5mg'$ ) twice daily 04/20/19 04/20/19  Renita Papa, PA-C  levonorgestrel (MIRENA) 20 MCG/24HR IUD 1 each by Intrauterine  route once.   06/25/20  [provider]      Allergies    Shellfish allergy, Sulfa antibiotics, and Adhesive [tape]    Review of Systems   Review of Systems  Cardiovascular:  Positive for chest pain.  All other systems reviewed and are negative.   Physical Exam Updated Vital Signs BP 121/86 (BP Location: Left Arm)   Pulse 79   Temp 98.1 F (36.7 C) (Oral)   Resp 20   Wt (!) 157.9 kg   LMP 05/31/2022   SpO2 97%   BMI 63.65 kg/m   Physical Exam Vitals and nursing note reviewed.  Constitutional:      General: She is not in acute distress.    Appearance: She is well-developed.  HENT:     Head: Normocephalic and atraumatic.  Eyes:     Conjunctiva/sclera: Conjunctivae normal.  Cardiovascular:     Rate and Rhythm: Normal rate and regular rhythm.     Heart sounds: No murmur heard. Pulmonary:     Effort: Pulmonary effort is normal. No respiratory distress.     Breath sounds: Normal breath sounds. No stridor. No wheezing, rhonchi or rales.     Comments: Patient tender to palpation lower sternal midline.  No overlying skin abnormalities appreciated. Chest:     Chest wall: Tenderness present.  Abdominal:     Palpations: Abdomen is soft.     Tenderness: There is no abdominal tenderness.  Musculoskeletal:        General: No swelling.     Cervical back: Neck supple.     Comments: Lower extremity swelling  Skin:    General: Skin is warm and dry.     Capillary Refill: Capillary refill takes less than 2 seconds.  Neurological:     Mental Status: She is alert.  Psychiatric:        Mood and Affect: Mood normal.     ED Results / Procedures / Treatments   Labs (all labs ordered are listed, but only abnormal results are displayed) Labs Reviewed  BASIC METABOLIC PANEL - Abnormal; Notable for the following components:      Result Value   Glucose, Bld 100 (*)    Calcium 8.7 (*)    All other components within normal limits  CBC - Abnormal; Notable for the following components:   Hemoglobin 11.3 (*)    HCT 35.9 (*)    All other components within normal limits  BRAIN NATRIURETIC PEPTIDE  POC URINE PREG, ED  TROPONIN I (HIGH SENSITIVITY)  TROPONIN I (HIGH SENSITIVITY)    EKG None  Radiology DG Chest 2 View  Result Date: 06/07/2022 CLINICAL DATA:  Chest pain. EXAM: CHEST - 2 VIEW COMPARISON:  May 26, 2022. FINDINGS: Mild cardiomegaly is noted with possible mild central pulmonary vascular congestion. Both  lungs are clear. The visualized skeletal structures are unremarkable. IMPRESSION: Mild cardiomegaly with possible mild central pulmonary vascular congestion. Electronically Signed   By: Marijo Conception M.D.   On: 06/07/2022 14:28    Procedures Procedures    Medications Ordered in ED Medications  ketorolac (TORADOL) 30 MG/ML injection 15 mg (15 mg Intramuscular Given 06/07/22 1722)    ED Course/ Medical Decision Making/ A&P                             Medical Decision Making Amount and/or Complexity of Data Reviewed Labs: ordered. Radiology: ordered.   This patient presents to the ED  for concern of chest pain, this involves an extensive number of treatment options, and is a complaint that carries with it a high risk of complications and morbidity.  The differential diagnosis includes ACS, PE, pneumonia, tension pneumothorax, aortic dissection, aortic aneurysm, pericarditis/myocarditis/tamponade, MSK   Co morbidities that complicate the patient evaluation  See HPI   Additional history obtained:  Additional history obtained from EMR External records from outside source obtained and reviewed including prior CT angio PE study from 05/26/2022 negative for PE.   Lab Tests:  I Ordered, and personally interpreted labs.  The pertinent results include: No leukocytosis noted.  Mild evidence anemia with a hemoglobin 11.3 of which is near patient's baseline from prior laboratory studies performed.  No platelet abnormalities.  No electrolyte abnormalities noted.  No renal dysfunction.  BNP within normal limits.  Initial troponin of 2 with repeat 2; EKG concerning for sinus rhythm with nonspecific T wave changes   Imaging Studies ordered:  I ordered imaging studies including chest x-ray I independently visualized and interpreted imaging which showed cardiomegaly with possible mild central pulmonary vascular congestion I agree with the radiologist interpretation   Cardiac Monitoring: /  EKG:  The patient was maintained on a cardiac monitor.  I personally viewed and interpreted the cardiac monitored which showed an underlying rhythm of: Sinus rhythm with nonspecific T wave changes   Consultations Obtained:  N/a   Problem List / ED Course / Critical interventions / Medication management  Chest pain I ordered medication including Toradol   Reevaluation of the patient after these medicines showed that the patient improved I have reviewed the patients home medicines and have made adjustments as needed   Social Determinants of Health:  Denies tobacco, illicit drug use   Test / Admission - Considered:  Chest pain Vitals signs within normal range and stable throughout visit. Laboratory/imaging studies significant for: See above Patient with overall reassuring per workup.  Doubt ACS given delta negative troponin.  Patient with heart score 0-3.  Doubt PE given that patient has a Wells PE score of 0 and PERC negative with recent CT angio chest PE from 05/22/2021 negative.  No evidence for pneumonia, aortic dissection, tamponade, peritonitis/myocarditis.  Patient's chest pain seems more musculoskeletal in etiology given reproducible tenderness on physical exam.  Patient responded well to Toradol administered while emergency department.  Recommend continue outpatient medicine in the form of nonsteroidal for episodes of chest pain.  Patient follows with cardiology for history of SVT recommend reevaluation if patient continues to feel symptomatic.  Treatment plan discussed at length with patient and she acknowledged understanding was agreeable to said plan. Worrisome signs and symptoms were discussed with the patient, and the patient acknowledged understanding to return to the ED if noticed. Patient was stable upon discharge.          Final Clinical Impression(s) / ED Diagnoses Final diagnoses:  Chest pain, unspecified type    Rx / DC Orders ED Discharge Orders      None         Wilnette Kales, [place] 06/07/22 1821    Isla Pence, MD 06/07/22 [phone]

## 2022-06-07 NOTE — Discharge Instructions (Signed)
Note the workup today was overall reassuring.  Recommend taking Tylenol/Motrin as needed for pain.  Recommend follow-up with cardiology for reassessment of your symptoms.  Please do not hesitate to return to emergency department for worrisome signs and symptoms we discussed become apparent.

## 2022-06-07 NOTE — Progress Notes (Signed)
Order for CPAP per result note from SS.

## 2022-06-07 NOTE — Telephone Encounter (Signed)
I did not need this encounter. °

## 2022-06-07 NOTE — Telephone Encounter (Signed)
Pt c/o of Chest Pain: STAT if CP now or developed within 24 hours  1. Are you having CP right now? Pain and pressure in chest  2. Are you experiencing any other symptoms (ex. SOB, nausea, vomiting, sweating)? Shortness of breath, hurting in her shoulders and neck- heart rate is 140  3. How long have you been experiencing CP?  This morning  4. Is your CP continuous or coming and going?  constantly  5. Have you taken Nitroglycerin? no ?

## 2022-06-07 NOTE — ED Triage Notes (Signed)
Pt in with central chest heaviness, onset when waking this morning. Reporting some sob and dizziness

## 2022-06-15 ENCOUNTER — Ambulatory Visit: Payer: No Typology Code available for payment source | Attending: Internal Medicine | Admitting: Internal Medicine

## 2022-06-15 ENCOUNTER — Ambulatory Visit: Payer: No Typology Code available for payment source

## 2022-06-15 ENCOUNTER — Ambulatory Visit: Payer: No Typology Code available for payment source | Attending: Internal Medicine

## 2022-06-15 ENCOUNTER — Encounter: Payer: Self-pay | Admitting: Internal Medicine

## 2022-06-15 ENCOUNTER — Other Ambulatory Visit: Payer: Self-pay | Admitting: Internal Medicine

## 2022-06-15 VITALS — BP 128/70 | HR 94 | Ht 62.0 in | Wt 349.0 lb

## 2022-06-15 DIAGNOSIS — R002 Palpitations: Secondary | ICD-10-CM

## 2022-06-15 NOTE — Patient Instructions (Signed)
Medication Instructions:  Your physician recommends that you continue on your current medications as directed. Please refer to the Current Medication list given to you today.  *If you need a refill on your cardiac medications before your next appointment, please call your pharmacy*   Lab Work: NONE   If you have labs (blood work) drawn today and your tests are completely normal, you will receive your results only by: [place] (if you have MyChart) OR A paper copy in the mail If you have any lab test that is abnormal or we need to change your treatment, we will call you to review the results.   Testing/Procedures: [place] [place]- Long Term Monitor Instructions   Your physician has requested you wear your ZIO patch monitor____14___days. You may remove on 06/29/22.   This is a single patch monitor.  [employer] supplies one patch monitor per enrollment.  Additional stickers are not available.   Please do not apply patch if you will be having a Nuclear Stress Test, Echocardiogram, Cardiac CT, MRI, or Chest Xray during the time frame you would be wearing the monitor. The patch cannot be worn during these tests.  You cannot remove and re-apply the ZIO XT patch monitor.   Your ZIO patch monitor will be sent USPS Priority mail from [facility] directly to your home address. The monitor may also be mailed to a PO BOX if home delivery is not available.   It may take 3-5 days to receive your monitor after you have been enrolled.   Once you have received you monitor, please review enclosed instructions.  Your monitor has already been registered assigning a specific monitor serial # to you.   Applying the monitor   Shave hair from upper left chest.   Hold abrader disc by orange tab.  Rub abrader in 40 strokes over left upper chest as indicated in your monitor instructions.   Clean area with 4 enclosed alcohol pads .  Use all pads to assure are is cleaned thoroughly.  Let dry.   Apply patch  as indicated in monitor instructions.  Patch will be place under collarbone on left side of chest with arrow pointing upward.   Rub patch adhesive wings for 2 minutes.Remove white label marked "1".  Remove white label marked "2".  Rub patch adhesive wings for 2 additional minutes.   While looking in a mirror, press and release button in center of patch.  A small green light will flash 3-4 times .  This will be your only indicator the monitor has been turned on.     Do not shower for the first 24 hours.  You may shower after the first 24 hours.   Press button if you feel a symptom. You will hear a small click.  Record Date, Time and Symptom in the Patient Log Book.   When you are ready to remove patch, follow instructions on last 2 pages of Patient Log Book.  Stick patch monitor onto last page of Patient Log Book.   Place Patient Log Book in [facility] box.  Use locking tab on box and tape box closed securely.  The [employer] has [employer] on it.  Please place in mailbox as soon as possible.  Your physician should have your test results approximately 7 days after the monitor has been mailed back to [facility].   Call [place] at [phone] if you have questions regarding your ZIO XT patch monitor.  Call them immediately  if you see an orange light blinking on your monitor.   If your monitor falls off in less than 4 days contact our Monitor department at [phone].  If your monitor becomes loose or falls off after 4 days call [employer] at [phone] for suggestions on securing your monitor.     Follow-Up: At [facility], you and your health needs are our priority.  As part of our continuing mission to provide you with exceptional heart care, we have created designated Provider Care Teams.  These Care Teams include your primary Cardiologist (physician) and Advanced Practice Providers (APPs -  Physician Assistants and Nurse Practitioners) who all  work together to provide you with the care you need, when you need it.  We recommend signing up for the patient portal called "MyChart".  Sign up information is provided on this After Visit Summary.  MyChart is used to connect with patients for Virtual Visits (Telemedicine).  Patients are able to view lab/test results, encounter notes, upcoming appointments, etc.  Non-urgent messages can be sent to your provider as well.   To learn more about what you can do with MyChart, go to [email or web address].    Your next appointment:    Pending monitor results   Provider:   [name], MD    Other Instructions Thank you for choosing [facility]!

## 2022-06-15 NOTE — Progress Notes (Signed)
HPI [name] returns today for followup of SVT. She is a pleasant 37 yo woman with morbid obesity who underwent EP study and catheter ablation over 3 years ago. At that time she was found to have inducible SVT. He Koch's triangle was quite large and ablation was limited by VA block during junctional rhythm. She developed recurrent symptoms and SVT and has refused repeat ablation. 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 Ablation was successful. Prior to ablation during the EP study she had incessant SVT and after ablation she was non-inducible and PR was less than RR. I have not seen her in over a year. She had palpitaitons last week but by the time she got to the ED she had slowed down. She admits to dietary indiscretion.  Allergies  Allergen Reactions   Shellfish Allergy Shortness Of Breath, Itching and Swelling    Mouth became swollen, throat started itching, and developed shortness of breath   Sulfa Antibiotics Itching, Shortness Of Breath and Swelling    Mouth became swollen, throat started itching, and developed shortness of breath   Adhesive [Tape] Itching    Depends on the type of medical tape     Current Outpatient Medications  Medication Sig Dispense Refill   albuterol (VENTOLIN HFA) 108 (90 Base) MCG/ACT inhaler Inhale 2 puffs into the lungs every 6 (six) hours as needed for wheezing or shortness of breath. 8 g 0   famotidine (PEPCID) 20 MG tablet Take 1 tablet (20 mg total) by mouth daily. 30 tablet 0   fluticasone furoate-vilanterol (BREO ELLIPTA) 100-25 MCG/ACT AEPB Inhale 1 puff into the lungs daily. 30 each 5   furosemide (LASIX) 20 MG tablet Take 1 tablet (20 mg total) by mouth daily. 30 tablet 0   ibuprofen (ADVIL) 600 MG tablet Take 600 mg by mouth every 6 (six) hours as needed for moderate pain.     levalbuterol (XOPENEX HFA) 45 MCG/ACT inhaler Inhale 2 puffs into the lungs every 6 (six)  hours as needed for wheezing. 1 each 3   levocetirizine (XYZAL) 5 MG tablet Take 1 tablet (5 mg total) by mouth in the morning and at bedtime. 60 tablet 0   montelukast (SINGULAIR) 10 MG tablet Take 1 tablet (10 mg total) by mouth at bedtime. 30 tablet 5   Multiple Vitamins-Calcium (ONE-A-DAY WOMENS PO) Take 1 tablet by mouth daily.     ofloxacin (FLOXIN) 0.3 % OTIC solution Place 5 drops into the left ear daily. 5 mL 0   potassium chloride SA (KLOR-CON M) 20 MEQ tablet Take 1 tablet (20 mEq total) by mouth 2 (two) times daily. 40 tablet 0   Relugolix-Estradiol-Norethind (MYFEMBREE) 40-1-0.5 MG TABS Take 1 tablet by mouth daily. 30 tablet 11   Vitamin D, Ergocalciferol, (DRISDOL) 1.25 MG (50000 UNIT) CAPS capsule Take 1 capsule (50,000 Units total) by mouth every 7 (seven) days. 7 capsule 2   ketorolac (TORADOL) 10 MG tablet Take 1 tablet (10 mg total) by mouth every 8 (eight) hours as needed. (Patient not taking: Reported on 05/26/2022) 15 tablet 0   No current facility-administered medications for this visit.     Past Medical History:  Diagnosis Date   Angio-edema    Arthritis    right shoulder   Asthma    Dyspnea    Dysrhythmia    hx of SVT   Fibroid (bleeding) (uterine)    Fibroid, uterine  H/O metrorrhagia    Hypertension    IUD (intrauterine device) in place 07/11/2015   Menorrhagia    Obesity, Class III, BMI 40-49.9 (morbid obesity) ([place]) 11/11/2018   Sleep apnea    uses CPAP - setting 11   SVT (supraventricular tachycardia)     ROS:   All systems reviewed and negative except as noted in the HPI.   Past Surgical History:  Procedure Laterality Date   DILATATION AND CURETTAGE/HYSTEROSCOPY WITH MINERVA N/A 12/01/2021   Procedure: DILATATION AND CURETTAGE/HYSTEROSCOPY WITH MINERVA;  Surgeon: [name], MD;  Location: [facility];  Service: Gynecology;  Laterality: N/A;   HYSTEROSCOPY N/A 09/17/2020   Procedure: HYSTEROSCOPY;  Surgeon: [name], MD;  Location: [facility];  Service: Gynecology;  Laterality: N/A;   HYSTEROSCOPY WITH D & C N/A 09/27/2013   Procedure: DILATATION AND CURETTAGE /HYSTEROSCOPY and insertion of Mirena IUD ;  Surgeon: [name]. Harrington Challenger, MD;  Location: [facility];  Service: Gynecology;  Laterality: N/A;   IUD REMOVAL N/A 09/17/2020   Procedure: INTRAUTERINE DEVICE (IUD) REMOVAL (2);  Surgeon: [name], MD;  Location: [facility];  Service: Gynecology;  Laterality: N/A;   MYOMECTOMY  02/03/2011   Procedure: MYOMECTOMY;  Surgeon: [name], MD;  Location: [facility];  Service: Gynecology;  Laterality: N/A;   SVT ABLATION N/A 11/08/2018   Procedure: SVT ABLATION;  Surgeon: [name], MD;  Location: [facility];  Service: Cardiovascular;  Laterality: N/A;   SVT ABLATION N/A 12/26/2020   Procedure: SVT ABLATION;  Surgeon: [name], MD;  Location: [place] [facility];  Service: Cardiovascular;  Laterality: N/A;   TONSILLECTOMY     TYMPANOSTOMY TUBE PLACEMENT       Family History  Problem Relation Age of Onset   Cirrhosis Mother    Diabetes Mother    Cancer Maternal Grandfather    Hypertension Sister    Diabetes Sister    Anesthesia problems Neg Hx    Hypotension Neg Hx    Malignant hyperthermia Neg Hx    Pseudochol deficiency Neg Hx      Social History   Socioeconomic History   Marital status: Single    Spouse name: Not on file   Number of children: Not on file   Years of education: Not on file   Highest education level: Not on file  Occupational History   Not on file  Tobacco Use   Smoking status: Never   Smokeless tobacco: Never   Tobacco comments:    socially  Vaping Use   Vaping Use: Never used  Substance and Sexual Activity   Alcohol use: Yes    Comment: occasional   Drug use: Yes    Types: Marijuana    Comment: on occ   Sexual activity: Not Currently    Birth control/protection: Condom  Other Topics Concern   Not on file  Social History Narrative   Not on file   Social Determinants of  Health   Financial Resource Strain: Not on file  Food Insecurity: Not on file  Transportation Needs: Not on file  Physical Activity: Not on file  Stress: Not on file  Social Connections: Not on file  Intimate Partner Violence: Not on file     BP 128/70   Pulse 94   Ht 5' 2"$  (1.575 m)   Wt (!) 349 lb (158.3 kg)   LMP 05/31/2022   SpO2 98%   BMI 63.83 kg/m   Physical Exam:  Morbidly obese appearing NAD HEENT: Unremarkable Neck:  No JVD, no thyromegally Lymphatics:  No adenopathy Back:  No CVA tenderness Lungs:  Clear with no wheezes HEART:  Regular rate rhythm, no murmurs, no rubs, no clicks Abd:  soft, positive bowel sounds, no organomegally, no rebound, no guarding Ext:  2 plus pulses, no edema, no cyanosis, no clubbing Skin:  No rashes no nodules Neuro:  CN II through XII intact, motor grossly intact   Assess/Plan: 1. SVT - I have discussed the treatment options and recommended she undergo a zio monitor to confirm that she has had break through SVT.  2. Obesity - she needs to lose weight.  3. HTN - her sbp is fairly well controlled. Weight loss would help.    [email or web address] [email or web address].

## 2022-06-18 ENCOUNTER — Emergency Department (HOSPITAL_COMMUNITY)
Admission: EM | Admit: 2022-06-18 | Discharge: 2022-06-18 | Disposition: A | Discharge status: Home / Self Care | Payer: No Typology Code available for payment source | Attending: Emergency Medicine | Admitting: Emergency Medicine

## 2022-06-18 ENCOUNTER — Emergency Department (HOSPITAL_COMMUNITY): Payer: No Typology Code available for payment source

## 2022-06-18 ENCOUNTER — Encounter (HOSPITAL_COMMUNITY): Payer: Self-pay

## 2022-06-18 ENCOUNTER — Other Ambulatory Visit: Payer: Self-pay

## 2022-06-18 DIAGNOSIS — Z1152 Encounter for screening for COVID-19: Secondary | ICD-10-CM | POA: Insufficient documentation

## 2022-06-18 DIAGNOSIS — R059 Cough, unspecified: Secondary | ICD-10-CM | POA: Diagnosis present

## 2022-06-18 DIAGNOSIS — J069 Acute upper respiratory infection, unspecified: Secondary | ICD-10-CM

## 2022-06-18 LAB — RESP PANEL BY RT-PCR (RSV, FLU A&B, COVID)  RVPGX2
Influenza A by PCR: NEGATIVE
Influenza B by PCR: NEGATIVE
Resp Syncytial Virus by PCR: NEGATIVE
SARS Coronavirus 2 by RT PCR: NEGATIVE

## 2022-06-18 MED ORDER — IBUPROFEN 800 MG PO TABS
800.0000 mg | ORAL_TABLET | Freq: Once | ORAL | Status: AC
  Administered 2022-06-18: 800 mg via ORAL
  Filled 2022-06-18: qty 1

## 2022-06-18 NOTE — ED Triage Notes (Signed)
Complaining of congestion, headache, and coughing all started yesterday.  Pt said it hurts in her chest when she coughs.

## 2022-06-18 NOTE — Discharge Instructions (Signed)
Drink plenty of fluids.  Take Tylenol or Motrin for aches.  Follow-up next week if not improving

## 2022-06-18 NOTE — ED Provider Notes (Signed)
[facility] Provider Note   CSN: [id number] Arrival date & time: 06/18/22  1753     History {Add pertinent medical, surgical, social history, OB history to HPI:1} Chief Complaint  Patient presents with   Cough    Jessica Sanchez is a 37 y.o. female.  Patient complains of cough and congestion for few days   Cough      Home Medications Prior to Admission medications   Medication Sig Start Date End Date Taking? Authorizing Provider  albuterol (VENTOLIN HFA) 108 (90 Base) MCG/ACT inhaler Inhale 2 puffs into the lungs every 6 (six) hours as needed for wheezing or shortness of breath. 10/26/21   [name], FNP  famotidine (PEPCID) 20 MG tablet Take 1 tablet (20 mg total) by mouth daily. 05/26/22   Godfrey Pick, MD  fluticasone furoate-vilanterol (BREO ELLIPTA) 100-25 MCG/ACT AEPB Inhale 1 puff into the lungs daily. 01/26/22 07/25/22  [name], FNP  furosemide (LASIX) 20 MG tablet Take 1 tablet (20 mg total) by mouth daily. 11/30/21   [name], FNP  ibuprofen (ADVIL) 600 MG tablet Take 600 mg by mouth every 6 (six) hours as needed for moderate pain.    [provider]  ketorolac (TORADOL) 10 MG tablet Take 1 tablet (10 mg total) by mouth every 8 (eight) hours as needed. Patient not taking: Reported on 05/26/2022 04/24/22   [name], MD  levalbuterol [facility] HFA) 45 MCG/ACT inhaler Inhale 2 puffs into the lungs every 6 (six) hours as needed for wheezing. 02/03/22   Ambs, Kathrine Cords, FNP  levocetirizine (XYZAL) 5 MG tablet Take 1 tablet (5 mg total) by mouth in the morning and at bedtime. 02/03/22 06/15/22  Dara Hoyer, FNP  montelukast (SINGULAIR) 10 MG tablet Take 1 tablet (10 mg total) by mouth at bedtime. 02/03/22   Dara Hoyer, FNP  Multiple Vitamins-Calcium (ONE-A-DAY WOMENS PO) Take 1 tablet by mouth daily.    [provider]  ofloxacin (FLOXIN) 0.3 % OTIC solution Place 5 drops into the left ear daily.  05/20/22   Lindell Spar, MD  potassium chloride SA (KLOR-CON M) 20 MEQ tablet Take 1 tablet (20 mEq total) by mouth 2 (two) times daily. 11/18/21   Fayrene Helper, MD  Relugolix-Estradiol-Norethind (MYFEMBREE) 40-1-0.5 MG TABS Take 1 tablet by mouth daily. 06/02/22   [name], MD  Vitamin D, Ergocalciferol, (DRISDOL) 1.25 MG (50000 UNIT) CAPS capsule Take 1 capsule (50,000 Units total) by mouth every 7 (seven) days. 04/13/22   [name], FNP  apixaban (ELIQUIS) 5 MG TABS tablet Take 2 tablets (55m) twice daily for 7 days, then 1 tablet (512m twice daily 04/20/19 04/20/19  FaRodell Perna, PA-C  levonorgestrel (MIRENA) 20 MCG/24HR IUD 1 each by Intrauterine route once.   06/25/20  [provider]      Allergies    Shellfish allergy, Sulfa antibiotics, and Adhesive [tape]    Review of Systems   Review of Systems  Respiratory:  Positive for cough.     Physical Exam Updated Vital Signs BP (!) 139/57 (BP Location: Right Arm)   Pulse 98   Temp 98 F (36.7 C) (Oral)   Resp 20   Ht 5' 2"$  (1.575 m)   Wt (!) 156.9 kg   LMP 05/31/2022   SpO2 98%   BMI 63.28 kg/m  Physical Exam  ED Results / Procedures / Treatments   Labs (all labs ordered are listed, but only  abnormal results are displayed) Labs Reviewed  RESP PANEL BY RT-PCR (RSV, FLU A&B, COVID)  RVPGX2    EKG None  Radiology DG Chest 2 View  Result Date: 06/18/2022 CLINICAL DATA:  Cough and congestion EXAM: CHEST - 2 VIEW COMPARISON:  06/07/2022 FINDINGS: Cardiac shadow is enlarged but stable. Lungs are well aerated bilaterally. No focal infiltrate or effusion is seen. Mild central vascular congestion is again noted. No bony abnormality is seen. IMPRESSION: Mild central vascular congestion stable from the prior exam. No new focal abnormality is seen. Electronically Signed   By: Inez Catalina M.D.   On: 06/18/2022 18:51    Procedures Procedures  {Document cardiac monitor, telemetry assessment procedure  when appropriate:1}  Medications Ordered in ED Medications  ibuprofen (ADVIL) tablet 800 mg (800 mg Oral Given 06/18/22 2007)    ED Course/ Medical Decision Making/ A&P   {   Click here for ABCD2, HEART and other calculatorsREFRESH Note before signing :1}                          Medical Decision Making Amount and/or Complexity of Data Reviewed Radiology: ordered.  Risk Prescription drug management.   Patient with URI.  Patient will take Tylenol Motrin drink plenty of fluids and follow-up as needed  {Document critical care time when appropriate:1} {Document review of labs and clinical decision tools ie heart score, Chads2Vasc2 etc:1}  {Document your independent review of radiology images, and any outside records:1} {Document your discussion with family members, caretakers, and with consultants:1} {Document social determinants of health affecting pt's care:1} {Document your decision making why or why not admission, treatments were needed:1} Final Clinical Impression(s) / ED Diagnoses Final diagnoses:  Viral URI with cough    Rx / DC Orders ED Discharge Orders     None

## 2022-07-01 ENCOUNTER — Encounter: Payer: Self-pay | Admitting: Family Medicine

## 2022-07-01 ENCOUNTER — Ambulatory Visit (INDEPENDENT_AMBULATORY_CARE_PROVIDER_SITE_OTHER): Payer: No Typology Code available for payment source | Admitting: Family Medicine

## 2022-07-01 ENCOUNTER — Encounter: Payer: Self-pay | Admitting: Radiology

## 2022-07-01 VITALS — BP 129/87 | HR 84 | Ht 62.0 in | Wt 346.0 lb

## 2022-07-01 DIAGNOSIS — G4489 Other headache syndrome: Secondary | ICD-10-CM

## 2022-07-01 DIAGNOSIS — E559 Vitamin D deficiency, unspecified: Secondary | ICD-10-CM | POA: Diagnosis not present

## 2022-07-01 DIAGNOSIS — E7849 Other hyperlipidemia: Secondary | ICD-10-CM

## 2022-07-01 DIAGNOSIS — R7301 Impaired fasting glucose: Secondary | ICD-10-CM

## 2022-07-01 DIAGNOSIS — E0789 Other specified disorders of thyroid: Secondary | ICD-10-CM

## 2022-07-01 DIAGNOSIS — Z6841 Body Mass Index (BMI) 40.0 and over, adult: Secondary | ICD-10-CM

## 2022-07-01 DIAGNOSIS — R519 Headache, unspecified: Secondary | ICD-10-CM | POA: Insufficient documentation

## 2022-07-01 MED ORDER — NURTEC 75 MG PO TBDP: 1.0000 | ORAL_TABLET | ORAL | 1 refills | Status: DC | PRN

## 2022-07-01 NOTE — Patient Instructions (Addendum)
I appreciate the opportunity to provide care to you today!    Follow up:  3 months  Labs: please stop by the lab today to get your blood drawn ( TSH, Lipid profile, HgA1c, Vit D)  Headaches Please start taking Nurtec 75 mg as needed for your headaches The max dose in 24 hours is '75mg'$     Weight Please follow up with weight management    Please continue to a heart-healthy diet and increase your physical activities. Try to exercise for 67mns at least five times a week.      It was a pleasure to see you and I look forward to continuing to work together on your health and well-being. Please do not hesitate to call the office if you need care or have questions about your care.   Have a wonderful day and week. With Gratitude, GAlvira MondayMSN, FNP-BC

## 2022-07-01 NOTE — Progress Notes (Signed)
Established Patient Office Visit  Subjective:  Patient ID: Jessica Sanchez, female    DOB: [birth year]  Age: 37 y.o. MRN: [id number]  CC:  Chief Complaint  Patient presents with   Follow-up    3 month f/u. Pt reports weight concerns, and frequent headaches. Pt concerned about last labs that were done in the hospital. (Hemoglobin levels, calcium level, hct levels and other labs.)    HPI Jessica Sanchez is a 37 y.o. female with past medical history of obesity, chronic urticaria, allergy, and SVT presents for f/u of  chronic medical conditions. For the details of today's visit, please refer to the assessment and plan.      Past Medical History:  Diagnosis Date   Angio-edema    Arthritis    right shoulder   Asthma    Dyspnea    Dysrhythmia    hx of SVT   Fibroid (bleeding) (uterine)    Fibroid, uterine    H/O metrorrhagia    Hypertension    IUD (intrauterine device) in place 07/11/2015   Menorrhagia    Obesity, Class III, BMI 40-49.9 (morbid obesity) ([place]) 11/11/2018   Sleep apnea    uses CPAP - setting 11   SVT (supraventricular tachycardia)     Past Surgical History:  Procedure Laterality Date   DILATATION AND CURETTAGE/HYSTEROSCOPY WITH MINERVA N/A 12/01/2021   Procedure: DILATATION AND CURETTAGE/HYSTEROSCOPY WITH MINERVA;  Surgeon: [name], MD;  Location: [facility];  Service: Gynecology;  Laterality: N/A;   HYSTEROSCOPY N/A 09/17/2020   Procedure: HYSTEROSCOPY;  Surgeon: [name], MD;  Location: [facility];  Service: Gynecology;  Laterality: N/A;   HYSTEROSCOPY WITH D & C N/A 09/27/2013   Procedure: DILATATION AND CURETTAGE /HYSTEROSCOPY and insertion of Mirena IUD ;  Surgeon: [name]. Harrington Challenger, MD;  Location: [facility];  Service: Gynecology;  Laterality: N/A;   IUD REMOVAL N/A 09/17/2020   Procedure: INTRAUTERINE DEVICE (IUD) REMOVAL (2);  Surgeon: [name], MD;  Location: [facility];  Service: Gynecology;  Laterality: N/A;   MYOMECTOMY  02/03/2011   Procedure:  MYOMECTOMY;  Surgeon: [name], MD;  Location: [facility];  Service: Gynecology;  Laterality: N/A;   SVT ABLATION N/A 11/08/2018   Procedure: SVT ABLATION;  Surgeon: [name], MD;  Location: [facility];  Service: Cardiovascular;  Laterality: N/A;   SVT ABLATION N/A 12/26/2020   Procedure: SVT ABLATION;  Surgeon: [name], MD;  Location: [facility];  Service: Cardiovascular;  Laterality: N/A;   TONSILLECTOMY     TYMPANOSTOMY TUBE PLACEMENT      Family History  Problem Relation Age of Onset   Cirrhosis Mother    Diabetes Mother    Cancer Maternal Grandfather    Hypertension Sister    Diabetes Sister    Anesthesia problems Neg Hx    Hypotension Neg Hx    Malignant hyperthermia Neg Hx    Pseudochol deficiency Neg Hx     Social History   Socioeconomic History   Marital status: Single    Spouse name: Not on file   Number of children: Not on file   Years of education: Not on file   Highest education level: Not on file  Occupational History   Not on file  Tobacco Use   Smoking status: Never   Smokeless tobacco: Never   Tobacco comments:    socially  Vaping Use   Vaping Use: Never used  Substance and Sexual Activity  Alcohol use: Yes    Comment: occasional   Drug use: Yes    Types: Marijuana    Comment: on occ   Sexual activity: Not Currently    Birth control/protection: Condom  Other Topics Concern   Not on file  Social History Narrative   Not on file   Social Determinants of Health   Financial Resource Strain: Not on file  Food Insecurity: Not on file  Transportation Needs: Not on file  Physical Activity: Not on file  Stress: Not on file  Social Connections: Not on file  Intimate Partner Violence: Not on file    Outpatient Medications Prior to Visit  Medication Sig Dispense Refill   albuterol (VENTOLIN HFA) 108 (90 Base) MCG/ACT inhaler Inhale 2 puffs into the lungs every 6 (six) hours as needed for wheezing or shortness of  breath. 8 g 0   famotidine (PEPCID) 20 MG tablet Take 1 tablet (20 mg total) by mouth daily. 30 tablet 0   fluticasone furoate-vilanterol (BREO ELLIPTA) 100-25 MCG/ACT AEPB Inhale 1 puff into the lungs daily. 30 each 5   furosemide (LASIX) 20 MG tablet Take 1 tablet (20 mg total) by mouth daily. 30 tablet 0   ibuprofen (ADVIL) 600 MG tablet Take 600 mg by mouth every 6 (six) hours as needed for moderate pain.     ketorolac (TORADOL) 10 MG tablet Take 1 tablet (10 mg total) by mouth every 8 (eight) hours as needed. 15 tablet 0   levalbuterol (XOPENEX HFA) 45 MCG/ACT inhaler Inhale 2 puffs into the lungs every 6 (six) hours as needed for wheezing. 1 each 3   montelukast (SINGULAIR) 10 MG tablet Take 1 tablet (10 mg total) by mouth at bedtime. 30 tablet 5   Multiple Vitamins-Calcium (ONE-A-DAY WOMENS PO) Take 1 tablet by mouth daily.     ofloxacin (FLOXIN) 0.3 % OTIC solution Place 5 drops into the left ear daily. 5 mL 0   potassium chloride SA (KLOR-CON M) 20 MEQ tablet Take 1 tablet (20 mEq total) by mouth 2 (two) times daily. 40 tablet 0   Relugolix-Estradiol-Norethind (MYFEMBREE) 40-1-0.5 MG TABS Take 1 tablet by mouth daily. 30 tablet 11   Vitamin D, Ergocalciferol, (DRISDOL) 1.25 MG (50000 UNIT) CAPS capsule Take 1 capsule (50,000 Units total) by mouth every 7 (seven) days. 7 capsule 2   levocetirizine (XYZAL) 5 MG tablet Take 1 tablet (5 mg total) by mouth in the morning and at bedtime. 60 tablet 0   No facility-administered medications prior to visit.    Allergies  Allergen Reactions   Shellfish Allergy Shortness Of Breath, Itching and Swelling    Mouth became swollen, throat started itching, and developed shortness of breath   Sulfa Antibiotics Itching, Shortness Of Breath and Swelling    Mouth became swollen, throat started itching, and developed shortness of breath   Adhesive [Tape] Itching    Depends on the type of medical tape    ROS Review of Systems  Constitutional:   Negative for chills and fever.  Eyes:  Negative for visual disturbance.  Respiratory:  Negative for chest tightness and shortness of breath.   Neurological:  Positive for headaches. Negative for dizziness.      Objective:    Physical Exam HENT:     Head: Normocephalic.     Mouth/Throat:     Mouth: Mucous membranes are moist.  Cardiovascular:     Rate and Rhythm: Normal rate.     Heart sounds: Normal heart sounds.  Pulmonary:  Effort: Pulmonary effort is normal.     Breath sounds: Normal breath sounds.  Neurological:     Mental Status: She is alert.     BP 129/87   Pulse 84   Ht '5\' 2"'$  (1.575 m)   Wt (!) 346 lb 0.6 oz (157 kg)   LMP 05/31/2022   SpO2 95%   BMI 63.29 kg/m  Wt Readings from Last 3 Encounters:  07/01/22 (!) 346 lb 0.6 oz (157 kg)  06/18/22 (!) 346 lb (156.9 kg)  06/15/22 (!) 349 lb (158.3 kg)    Lab Results  Component Value Date   TSH 1.310 03/30/2022   Lab Results  Component Value Date   WBC 5.0 06/07/2022   HGB 11.3 (L) 06/07/2022   HCT 35.9 (L) 06/07/2022   MCV 83.5 06/07/2022   PLT 301 06/07/2022   Lab Results  Component Value Date   NA 137 06/07/2022   K 3.9 06/07/2022   CO2 24 06/07/2022   GLUCOSE 100 (H) 06/07/2022   BUN 11 06/07/2022   CREATININE 0.60 06/07/2022   BILITOT 0.6 05/26/2022   ALKPHOS 49 05/26/2022   AST 12 (L) 05/26/2022   ALT 13 05/26/2022   PROT 6.8 05/26/2022   ALBUMIN 3.3 (L) 05/26/2022   CALCIUM 8.7 (L) 06/07/2022   ANIONGAP 8 06/07/2022   EGFR 115 11/27/2021   Lab Results  Component Value Date   CHOL 135 03/30/2022   Lab Results  Component Value Date   HDL 33 (L) 03/30/2022   Lab Results  Component Value Date   LDLCALC 93 03/30/2022   Lab Results  Component Value Date   TRIG 35 03/30/2022   Lab Results  Component Value Date   CHOLHDL 4.1 03/30/2022   Lab Results  Component Value Date   HGBA1C 5.9 (H) 03/30/2022      Assessment & Plan:  Other headache syndrome Assessment &  Plan: Onset of symptoms a month ago She complains of a unilateral pressure-like headache Headache is rated 9-10 Reports sensitivity to lights Denies nausea or vomiting Reports taking Tylenol and Aleve with minimal relief of her symptoms Denies red flag symptoms of  her headaches Patient is not a candidate for triptans Will be treated today with Nurtec 75 mg for acute migraines headaches   Morbid obesity with body mass index (BMI) of 60.0 to 69.9 in adult [facility]) Assessment & Plan: She has not yet followed up with weight management Encouraged patient to follow up with weight management She currently sees a licensed dietitian and reports intermittent fasting Encouraged to continue regimen  Wt Readings from Last 3 Encounters:  07/01/22 (!) 346 lb 0.6 oz (157 kg)  06/18/22 (!) 346 lb (156.9 kg)  06/15/22 (!) 349 lb (158.3 kg)      Vitamin D deficiency -     VITAMIN D 25 Hydroxy (Vit-D Deficiency, Fractures) -     Nurtec; Take 1 tablet (75 mg total) by mouth as needed.  Dispense: 30 tablet; Refill: 1  Other hyperlipidemia -     Lipid panel  Other specified disorders of thyroid -     TSH + free T4  Impaired fasting blood sugar -     Hemoglobin A1c    Follow-up: Return in about 3 months (around 09/29/2022).   [name], FNP

## 2022-07-01 NOTE — Assessment & Plan Note (Signed)
She has not yet followed up with weight management Encouraged patient to follow up with weight management She currently sees a licensed dietitian and reports intermittent fasting Encouraged to continue regimen  Wt Readings from Last 3 Encounters:  07/01/22 (!) 346 lb 0.6 oz (157 kg)  06/18/22 (!) 346 lb (156.9 kg)  06/15/22 (!) 349 lb (158.3 kg)

## 2022-07-01 NOTE — Assessment & Plan Note (Signed)
Onset of symptoms a month ago She complains of a unilateral pressure-like headache Headache is rated 9-10 Reports sensitivity to lights Denies nausea or vomiting Reports taking Tylenol and Aleve with minimal relief of her symptoms Denies red flag symptoms of  her headaches Patient is not a candidate for triptans Will be treated today with Nurtec 75 mg for acute migraines headaches

## 2022-07-08 ENCOUNTER — Ambulatory Visit: Payer: No Typology Code available for payment source | Admitting: Pulmonary Disease

## 2022-07-08 ENCOUNTER — Encounter: Payer: Self-pay | Admitting: Pulmonary Disease

## 2022-07-08 VITALS — BP 134/74 | HR 78 | Ht 62.0 in | Wt 347.6 lb

## 2022-07-08 DIAGNOSIS — J4530 Mild persistent asthma, uncomplicated: Secondary | ICD-10-CM | POA: Diagnosis not present

## 2022-07-08 DIAGNOSIS — G4733 Obstructive sleep apnea (adult) (pediatric): Secondary | ICD-10-CM | POA: Diagnosis not present

## 2022-07-08 NOTE — Progress Notes (Signed)
   Subjective:    Patient ID: Jessica Sanchez, female    DOB: March 14, 1986, 37 y.o.   MRN: [id number]  HPI 37 year old morbidly obese woman for follow-up of asthma and OSA She sees Dr. 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    PMH -borderline diabetes, SVT  Chief Complaint  Patient presents with   Follow-up    Has not gotten CPAP yet  Community message sent to adapt to follow up   81-monthfollow-up visit. We reviewed sleep study which showed severe OSA corrected by CPAP of 10 cm We sent a prescription to DME, for some reason she has not received CPAP yet. The previous machine she was using was borrowed from a family member and is not working  Asthma is generally worse during spring, she is maintained on Singulair and Xyzal besides BFormoso Has not had to use albuterol MDI occasionally, denies nocturnal symptoms   Significant tests/ events reviewed   05/2022 split study >> AHI 77/h , low sat 77% >> CPAP 10   01/2019 split study -299 pounds -severe OSA, AHI 73/hour, lowest desaturation 75% , corrected by CPAP 11 cm with a small F20 fullface mask   12/2021 spirometry shows moderate restriction with ratio 75, FEV1 60%, FVC 67%  Review of Systems neg for any significant sore throat, dysphagia, itching, sneezing, nasal congestion or excess/ purulent secretions, fever, chills, sweats, unintended wt loss, pleuritic or exertional cp, hempoptysis, orthopnea pnd or change in chronic leg swelling. Also denies presyncope, palpitations, heartburn, abdominal pain, nausea, vomiting, diarrhea or change in bowel or urinary habits, dysuria,hematuria, rash, arthralgias, visual complaints, headache, numbness weakness or ataxia.     Objective:   Physical Exam   Gen. Pleasant, obese, in no distress ENT - no lesions, no post nasal drip Neck: No JVD, no thyromegaly, no carotid bruits Lungs: no use of accessory muscles, no dullness to  percussion, decreased without rales or rhonchi  Cardiovascular: Rhythm regular, heart sounds  normal, no murmurs or gallops, no peripheral edema Musculoskeletal: No deformities, no cyanosis or clubbing , no tremors        Assessment & Plan:

## 2022-07-08 NOTE — Assessment & Plan Note (Signed)
Continue Breo. She will continue Xyzal and Singulair for seasonal allergies. Use albuterol for rescue, we discussed action plan for asthma

## 2022-07-08 NOTE — Assessment & Plan Note (Signed)
Prescription for CPAP 10 cmH2O has been sent to DME.  Will check with them to see why machine has not been delivered yet and if needed sent to a different DME.  She had nice REM rebound during the study after CPAP and I am hopeful that CPAP will certainly help improve her daytime somnolence and fatigue. There is no history suggestive of cataplexy, sleep paralysis or parasomnias

## 2022-07-08 NOTE — Patient Instructions (Signed)
X Rx for CPAP 10 cm to DME

## 2022-07-26 ENCOUNTER — Encounter: Payer: Self-pay | Admitting: Family Medicine

## 2022-07-26 ENCOUNTER — Encounter (HOSPITAL_COMMUNITY): Payer: Self-pay | Admitting: *Deleted

## 2022-07-26 ENCOUNTER — Telehealth: Payer: Self-pay | Admitting: Family Medicine

## 2022-07-26 ENCOUNTER — Emergency Department (HOSPITAL_COMMUNITY): Payer: No Typology Code available for payment source

## 2022-07-26 ENCOUNTER — Emergency Department (HOSPITAL_COMMUNITY)
Admission: EM | Admit: 2022-07-26 | Discharge: 2022-07-26 | Disposition: A | Discharge status: Home / Self Care | Payer: No Typology Code available for payment source | Attending: Emergency Medicine | Admitting: Emergency Medicine

## 2022-07-26 ENCOUNTER — Other Ambulatory Visit: Payer: Self-pay

## 2022-07-26 ENCOUNTER — Telehealth (INDEPENDENT_AMBULATORY_CARE_PROVIDER_SITE_OTHER): Payer: No Typology Code available for payment source | Admitting: Family Medicine

## 2022-07-26 DIAGNOSIS — R42 Dizziness and giddiness: Secondary | ICD-10-CM

## 2022-07-26 DIAGNOSIS — R1031 Right lower quadrant pain: Secondary | ICD-10-CM | POA: Diagnosis not present

## 2022-07-26 DIAGNOSIS — R112 Nausea with vomiting, unspecified: Secondary | ICD-10-CM | POA: Diagnosis not present

## 2022-07-26 DIAGNOSIS — Z7901 Long term (current) use of anticoagulants: Secondary | ICD-10-CM | POA: Insufficient documentation

## 2022-07-26 DIAGNOSIS — Z7951 Long term (current) use of inhaled steroids: Secondary | ICD-10-CM | POA: Diagnosis not present

## 2022-07-26 DIAGNOSIS — Z79899 Other long term (current) drug therapy: Secondary | ICD-10-CM | POA: Diagnosis not present

## 2022-07-26 DIAGNOSIS — J45909 Unspecified asthma, uncomplicated: Secondary | ICD-10-CM | POA: Insufficient documentation

## 2022-07-26 DIAGNOSIS — R519 Headache, unspecified: Secondary | ICD-10-CM | POA: Diagnosis not present

## 2022-07-26 DIAGNOSIS — I10 Essential (primary) hypertension: Secondary | ICD-10-CM | POA: Diagnosis not present

## 2022-07-26 LAB — CBC WITH DIFFERENTIAL/PLATELET
Abs Immature Granulocytes: 0.01 10*3/uL (ref 0.00–0.07)
Basophils Absolute: 0 10*3/uL (ref 0.0–0.1)
Basophils Relative: 0 %
Eosinophils Absolute: 0.1 10*3/uL (ref 0.0–0.5)
Eosinophils Relative: 2 %
HCT: 37.6 % (ref 36.0–46.0)
Hemoglobin: 11.7 g/dL — ABNORMAL LOW (ref 12.0–15.0)
Immature Granulocytes: 0 %
Lymphocytes Relative: 38 %
Lymphs Abs: 2 10*3/uL (ref 0.7–4.0)
MCH: 26.5 pg (ref 26.0–34.0)
MCHC: 31.1 g/dL (ref 30.0–36.0)
MCV: 85.1 fL (ref 80.0–100.0)
Monocytes Absolute: 0.5 10*3/uL (ref 0.1–1.0)
Monocytes Relative: 9 %
Neutro Abs: 2.6 10*3/uL (ref 1.7–7.7)
Neutrophils Relative %: 51 %
Platelets: 307 10*3/uL (ref 150–400)
RBC: 4.42 MIL/uL (ref 3.87–5.11)
RDW: 15 % (ref 11.5–15.5)
WBC: 5.2 10*3/uL (ref 4.0–10.5)
nRBC: 0 % (ref 0.0–0.2)

## 2022-07-26 LAB — BASIC METABOLIC PANEL
Anion gap: 6 (ref 5–15)
BUN: 12 mg/dL (ref 6–20)
CO2: 25 mmol/L (ref 22–32)
Calcium: 8.4 mg/dL — ABNORMAL LOW (ref 8.9–10.3)
Chloride: 104 mmol/L (ref 98–111)
Creatinine, Ser: 0.56 mg/dL (ref 0.44–1.00)
GFR, Estimated: >60 mL/min (ref >60)
Glucose, Bld: 95 mg/dL (ref 70–99)
Potassium: 4 mmol/L (ref 3.5–5.1)
Sodium: 135 mmol/L (ref 135–145)

## 2022-07-26 LAB — HEPATIC FUNCTION PANEL
ALT: 16 U/L (ref 0–44)
AST: 16 U/L (ref 15–41)
Albumin: 3.6 g/dL (ref 3.5–5.0)
Alkaline Phosphatase: 56 U/L (ref 38–126)
Bilirubin, Direct: 0.2 mg/dL (ref 0.0–0.2)
Indirect Bilirubin: 0.4 mg/dL (ref 0.3–0.9)
Total Bilirubin: 0.6 mg/dL (ref 0.3–1.2)
Total Protein: 7.2 g/dL (ref 6.5–8.1)

## 2022-07-26 LAB — LIPASE, BLOOD: Lipase: 23 U/L (ref 11–51)

## 2022-07-26 MED ORDER — KETOROLAC TROMETHAMINE 30 MG/ML IJ SOLN
30.0000 mg | Freq: Once | INTRAMUSCULAR | Status: AC
  Administered 2022-07-26: 30 mg via INTRAVENOUS
  Filled 2022-07-26: qty 1

## 2022-07-26 MED ORDER — ONDANSETRON HCL 4 MG/2ML IJ SOLN
4.0000 mg | Freq: Once | INTRAMUSCULAR | Status: AC
  Administered 2022-07-26: 4 mg via INTRAVENOUS
  Filled 2022-07-26: qty 2

## 2022-07-26 MED ORDER — MECLIZINE HCL 12.5 MG PO TABS: 12.5000 mg | ORAL_TABLET | Freq: Three times a day (TID) | ORAL | 0 refills | Status: DC | PRN

## 2022-07-26 MED ORDER — METOCLOPRAMIDE HCL 5 MG/ML IJ SOLN
5.0000 mg | Freq: Once | INTRAMUSCULAR | Status: AC
  Administered 2022-07-26: 5 mg via INTRAVENOUS
  Filled 2022-07-26: qty 2

## 2022-07-26 MED ORDER — SODIUM CHLORIDE 0.9 % IV BOLUS
500.0000 mL | Freq: Once | INTRAVENOUS | Status: AC
  Administered 2022-07-26: 500 mL via INTRAVENOUS

## 2022-07-26 MED ORDER — MECLIZINE HCL 12.5 MG PO TABS
25.0000 mg | ORAL_TABLET | Freq: Once | ORAL | Status: AC
  Administered 2022-07-26: 25 mg via ORAL
  Filled 2022-07-26: qty 2

## 2022-07-26 NOTE — Progress Notes (Unsigned)
Virtual Visit via Video Note  I connected with Jessica Sanchez on 07/27/22 at 11:40 AM EDT by a video enabled telemedicine application and verified that I am speaking with the correct person using two identifiers.  Patient Location: Home Provider Location: Office/Clinic  I discussed the limitations, risks, security, and privacy concerns of performing an evaluation and management service by video and the availability of in person appointments. I also discussed with the patient that there may be a patient responsible charge related to this service. The patient expressed understanding and agreed to proceed.  Subjective: PCP: [name], FNP  Chief Complaint  Patient presents with   Headache    Pt reports severe headache and feeling the room spinning feels jittery. Pt reports sx started last night (07/25/2022), took tylenol last night for headache its not helping. Breathing is shallow.    HPI  Vertigo: She complains of a spinning sensation with headaches and dizziness, noting increased dizziness when changing position from a sitting to a standing position.  She reports adequate fluid intake.  Denies tendinitis.  RLQ: She complains of constant right lower quadrant pain.  she denies nausea, vomiting, fever, and changes in her bowel movements.  She reports that the pain is constant and radiates to her lower back.  No urgency, frequency, or dysuria was reported.  Pain is unchanged with p.o. intake.  The ASCVD Risk score (Arnett DK, et al., 2019) failed to calculate for the following reasons:   The 2019 ASCVD risk score is only valid for ages 71 to 16   ROS: Per HPI  Current Outpatient Medications:    albuterol (VENTOLIN HFA) 108 (90 Base) MCG/ACT inhaler, Inhale 2 puffs into the lungs every 6 (six) hours as needed for wheezing or shortness of breath., Disp: 8 g, Rfl: 0   furosemide (LASIX) 20 MG tablet, Take 1 tablet (20 mg total) by mouth daily., Disp: 30 tablet, Rfl: 0   meclizine  (ANTIVERT) 12.5 MG tablet, Take 1 tablet (12.5 mg total) by mouth 3 (three) times daily as needed for dizziness., Disp: 30 tablet, Rfl: 0   Multiple Vitamins-Calcium (ONE-A-DAY WOMENS PO), Take 1 tablet by mouth daily., Disp: , Rfl:    potassium chloride SA (KLOR-CON M) 20 MEQ tablet, Take 1 tablet (20 mEq total) by mouth 2 (two) times daily., Disp: 40 tablet, Rfl: 0   Relugolix-Estradiol-Norethind (MYFEMBREE) 40-1-0.5 MG TABS, Take 1 tablet by mouth daily., Disp: 30 tablet, Rfl: 11   Rimegepant Sulfate (NURTEC) 75 MG TBDP, Take 1 tablet (75 mg total) by mouth as needed. (Patient taking differently: Take 1 tablet by mouth as needed (migraines).), Disp: 30 tablet, Rfl: 1   Vitamin D, Ergocalciferol, (DRISDOL) 1.25 MG (50000 UNIT) CAPS capsule, Take 1 capsule (50,000 Units total) by mouth every 7 (seven) days., Disp: 7 capsule, Rfl: 2   fluticasone furoate-vilanterol (BREO ELLIPTA) 100-25 MCG/ACT AEPB, Inhale 1 puff into the lungs daily., Disp: , Rfl:    levocetirizine (XYZAL) 5 MG tablet, Take 1 tablet (5 mg total) by mouth in the morning and at bedtime., Disp: 60 tablet, Rfl: 0  Observations/Objective: There were no vitals filed for this visit. Physical Exam Patient is well-developed, well-nourished in no acute distress.  Resting  at home.  Head is normocephalic, atraumatic.  No labored breathing.  Speech is clear and coherent with logical content.  Patient is alert and oriented at baseline.   Assessment and Plan: Vertigo -     Meclizine HCl; Take 1 tablet (12.5 mg  total) by mouth 3 (three) times daily as needed for dizziness.  Dispense: 30 tablet; Refill: 0  Abdominal pain, RLQ -     CT ABDOMEN PELVIS WO CONTRAST  Encouraged to increase her fluid intake, at least 64 ounces daily Encouraged to take Tylenol as needed for headaches and bodyaches Encouraged to change positions slowly from a sitting to standing position Will get imaging studies of her abdomen  Follow Up  Instructions: Encouraged to follow-up for worsening of symptoms  I discussed the assessment and treatment plan with the patient. The patient was provided an opportunity to ask questions, and all were answered. The patient agreed with the plan and demonstrated an understanding of the instructions.   The patient was advised to call back or seek an in-person evaluation if the symptoms worsen or if the condition fails to improve as anticipated.  The above assessment and management plan was discussed with the patient. The patient verbalized understanding of and has agreed to the management plan.   [name], FNP

## 2022-07-26 NOTE — Telephone Encounter (Signed)
Error

## 2022-07-26 NOTE — ED Notes (Signed)
Dc instructions reviewed with pt no questions or concerns at this time.  °

## 2022-07-26 NOTE — Discharge Instructions (Signed)
You have been seen today for your complaint of headache, dizziness. Your lab work was overall reassuring. Your imaging was reassuring and showed no abnormalities. Follow up with: Your primary care provider or neurologist for reevaluation of your symptoms Please seek immediate medical care if you develop any of the following symptoms: Your headache: Becomes severe quickly. Gets worse after moderate to intense physical activity. You have any of these symptoms: Repeated vomiting. Pain or stiffness in your neck. Changes to your vision. Pain in an eye or ear. Problems with speech. Muscular weakness or loss of muscle control. Loss of balance or coordination. You feel faint or pass out. You have confusion. You have a seizure. At this time there does not appear to be the presence of an emergent medical condition, however there is always the potential for conditions to change. Please read and follow the below instructions.  Do not take your medicine if  develop an itchy rash, swelling in your mouth or lips, or difficulty breathing; call 911 and seek immediate emergency medical attention if this occurs.  You may review your lab tests and imaging results in their entirety on your MyChart account.  Please discuss all results of fully with your primary care provider and other specialist at your follow-up visit.  Note: Portions of this text may have been transcribed using voice recognition software. Every effort was made to ensure accuracy; however, inadvertent computerized transcription errors may still be present.

## 2022-07-26 NOTE — ED Triage Notes (Signed)
Pt with HA since yesterday morning, dizziness started 1400 yesterday afternoon.  + N/V today.  Pt states she fell today after standing up today. Denies hitting her head. Also c/o abd pain x 3 days.

## 2022-07-26 NOTE — ED Provider Notes (Signed)
[facility] Provider Note   CSN: [id number] Arrival date & time: 07/26/22  1358     History  Chief Complaint  Patient presents with   Headache    Jessica Sanchez is a 37 y.o. female.  With a significant past medical history including but not limited to uterine fibroids, obesity, sleep apnea, asthma, arthritis, hypertension who presents to the ED for evaluation of headache that she describes as a generalized pressure.  She states she woke up yesterday with a headache and has progressively gotten worse.  She took ibuprofen and Tylenol once each yesterday with some improvement, however had a difficult time sleeping due to the pain.  She has not taken anything for her symptoms today.  She states that when she woke up this morning the headache was worse.  She was sitting on her couch and then felt dizzy so she grabbed the couch and fell to her right hip.  She did not lose consciousness or hit her head.  She was able to get herself up off the floor.  Shortly after that she developed double vision and dizziness described as the room spinning.  She had a virtual appointment and was encouraged to come to the ED for further evaluation.  She states the double vision lasted approximately 5 hours and then resolved spontaneously.  She denies neck stiffness, fevers, chest pain, shortness of breath.  She did report some nausea and vomited up sputum prior to arrival.  She denies numbness, weakness or tingling.   Headache Associated symptoms: nausea        Home Medications Prior to Admission medications   Medication Sig Start Date End Date Taking? Authorizing Provider  albuterol (VENTOLIN HFA) 108 (90 Base) MCG/ACT inhaler Inhale 2 puffs into the lungs every 6 (six) hours as needed for wheezing or shortness of breath. 10/26/21  Yes [name], FNP  fluticasone furoate-vilanterol (BREO ELLIPTA) 100-25 MCG/ACT AEPB Inhale 1 puff into the lungs daily.   Yes  [provider]  furosemide (LASIX) 20 MG tablet Take 1 tablet (20 mg total) by mouth daily. 11/30/21  Yes [name], FNP  levocetirizine (XYZAL) 5 MG tablet Take 1 tablet (5 mg total) by mouth in the morning and at bedtime. 02/03/22  Yes Ambs, Kathrine Cords, FNP  Multiple Vitamins-Calcium (ONE-A-DAY WOMENS PO) Take 1 tablet by mouth daily.   Yes [provider]  potassium chloride SA (KLOR-CON M) 20 MEQ tablet Take 1 tablet (20 mEq total) by mouth 2 (two) times daily. 11/18/21  Yes Fayrene Helper, MD  Relugolix-Estradiol-Norethind (MYFEMBREE) 40-1-0.5 MG TABS Take 1 tablet by mouth daily. 06/02/22  Yes [name], MD  Rimegepant Sulfate (NURTEC) 75 MG TBDP Take 1 tablet (75 mg total) by mouth as needed. Patient taking differently: Take 1 tablet by mouth as needed (migraines). 07/01/22  Yes [name], FNP  Vitamin D, Ergocalciferol, (DRISDOL) 1.25 MG (50000 UNIT) CAPS capsule Take 1 capsule (50,000 Units total) by mouth every 7 (seven) days. 04/13/22  Yes [name], FNP  meclizine (ANTIVERT) 12.5 MG tablet Take 1 tablet (12.5 mg total) by mouth 3 (three) times daily as needed for dizziness. 07/26/22   [name], FNP  apixaban (ELIQUIS) 5 MG TABS tablet Take 2 tablets (10mg ) twice daily for 7 days, then 1 tablet (5mg ) twice daily 04/20/19 04/20/19  Rodell Perna A, PA-C  levonorgestrel (MIRENA) 20 MCG/24HR IUD 1 each by Intrauterine route once.   06/25/20  [provider]      Allergies    Shellfish allergy, Sulfa antibiotics, and Adhesive [tape]    Review of Systems   Review of Systems  Gastrointestinal:  Positive for nausea.  Neurological:  Positive for headaches.  All other systems reviewed and are negative.   Physical Exam Updated Vital Signs BP (!) 114/59   Pulse 77   Temp 98 F (36.7 C)   Resp 18   Ht 5\' 1"  (1.549 m)   Wt (!) 154.7 kg   SpO2 97%   BMI 64.43 kg/m  Physical Exam Vitals and nursing note reviewed.  Constitutional:       General: She is not in acute distress.    Appearance: She is well-developed. She is obese.     Comments: Morbidly obese  HENT:     Head: Normocephalic and atraumatic.  Eyes:     Conjunctiva/sclera: Conjunctivae normal.  Cardiovascular:     Rate and Rhythm: Normal rate and regular rhythm.     Heart sounds: No murmur heard. Pulmonary:     Effort: Pulmonary effort is normal. No respiratory distress.     Breath sounds: Normal breath sounds.  Abdominal:     Palpations: Abdomen is soft.     Tenderness: There is no abdominal tenderness.  Musculoskeletal:        General: No swelling.     Cervical back: Normal range of motion and neck supple. No rigidity.  Skin:    General: Skin is warm and dry.     Capillary Refill: Capillary refill takes less than 2 seconds.  Neurological:     Mental Status: She is alert and oriented to person, place, and time.     GCS: GCS eye subscore is 4. GCS verbal subscore is 5. GCS motor subscore is 6.     Comments: No facial asymmetry, slurred speech, unilateral or global weakness, pronator drift.  Normal finger-to-nose.  Sensation intact in all extremities.  Psychiatric:        Mood and Affect: Mood normal.     ED Results / Procedures / Treatments   Labs (all labs ordered are listed, but only abnormal results are displayed) Labs Reviewed  CBC WITH DIFFERENTIAL/PLATELET - Abnormal; Notable for the following components:      Result Value   Hemoglobin 11.7 (*)    All other components within normal limits  BASIC METABOLIC PANEL - Abnormal; Notable for the following components:   Calcium 8.4 (*)    All other components within normal limits  HEPATIC FUNCTION PANEL  LIPASE, BLOOD    EKG None  Radiology CT Head Wo Contrast  Result Date: 07/26/2022 CLINICAL DATA:  Headache EXAM: CT HEAD WITHOUT CONTRAST TECHNIQUE: Contiguous axial images were obtained from the base of the skull through the vertex without intravenous contrast. RADIATION DOSE REDUCTION:  This exam was performed according to the departmental dose-optimization program which includes automated exposure control, adjustment of the mA and/or kV according to patient size and/or use of iterative reconstruction technique. COMPARISON:  CT 02/05/2020 FINDINGS: Brain: No acute territorial infarction, hemorrhage, or intracranial mass. The ventricles are nonenlarged. Partially empty sella. Vascular: No hyperdense vessels.  No unexpected calcification Skull: Normal. Negative for fracture or focal lesion. Sinuses/Orbits: No acute finding. Other: None. IMPRESSION: Negative non contrasted CT appearance of the brain. Electronically Signed   By: Donavan Foil M.D.   On: 07/26/2022 18:45    Procedures Procedures    Medications Ordered in ED Medications  ketorolac (TORADOL) 30 MG/ML injection 30  mg (has no administration in time range)  sodium chloride 0.9 % bolus 500 mL (0 mLs Intravenous Stopped 07/26/22 2050)  ketorolac (TORADOL) 30 MG/ML injection 30 mg (30 mg Intravenous Given 07/26/22 1718)  ondansetron (ZOFRAN) injection 4 mg (4 mg Intravenous Given 07/26/22 1718)  metoCLOPramide (REGLAN) injection 5 mg (5 mg Intravenous Given 07/26/22 1717)  meclizine (ANTIVERT) tablet 25 mg (25 mg Oral Given 07/26/22 1725)    ED Course/ Medical Decision Making/ A&P Clinical Course as of 07/26/22 2054  Mon Jul 26, 2022  2042 Patient reports for headache is mild at this time.  Dizziness and double vision have completely resolved.  is requesting discharge home. [AS]    Clinical Course User Index [AS] Martesha Niedermeier, [place] Folk, PA-C                             Medical Decision Making Amount and/or Complexity of Data Reviewed Labs: ordered. Radiology: ordered.  Risk Prescription drug management.  This patient presents to the ED for concern of headache, dizziness, this involves an extensive number of treatment options, and is a complaint that carries with it a high risk of complications and morbidity. Emergent  considerations for headache include subarachnoid hemorrhage, meningitis, temporal arteritis, glaucoma, cerebral ischemia, carotid/vertebral dissection, intracranial tumor, Venous sinus thrombosis, carbon monoxide poisoning, acute or chronic subdural hemorrhage.  Other considerations include: Migraine, Cluster headache, Hypertension, Caffeine, alcohol, or drug withdrawal, Pseudotumor cerebri, Arteriovenous malformation, Head injury, Neurocysticercosis, Post-lumbar puncture, Preeclampsia, Tension headache, Sinusitis, Cervical arthritis, Refractive error causing strain, Dental abscess, Otitis media, Temporomandibular joint syndrome, Depression, Somatoform disorder (eg, somatization) Trigeminal neuralgia, Glossopharyngeal neuralgia.  Co morbidities that complicate the patient evaluation  significant past medical history including but not limited to uterine fibroids, obesity, sleep apnea, asthma, arthritis, hypertension  My initial workup includes labs, CT head, headache cocktail  Additional history obtained from: Nursing notes from this visit. Previous records within EMR system ED visit for similar on 10/08/2021  I ordered, reviewed and interpreted labs which include: CBC, CMP, lipase  I ordered imaging studies including CT head I independently visualized and interpreted imaging which showed normal I agree with the radiologist interpretation  Afebrile, hemodynamically stable.  37 year old female presenting to the ED for evaluation of headache, dizziness, double vision.  She appears to be in a poor state of health, however is not in any acute distress on my exam.  She has no neurologic deficits.  She did not have any double vision on arrival.  Her dizziness was mild and was treated with meclizine with success.  Her headache was also treated in the ED with success.  She had not taken anything for her headache at home prior to arrival.  CT head reassuring.  Labs reassuring.  Low suspicion for meningitis.   Unsure etiology of her vision changes.  She was encouraged to follow-up with her primary care provider or neurologist regarding this, however I have low suspicion for acute emergent abnormalities.  She has had similar symptoms in the past on chart review.  She was given return precautions.  Stable at discharge.  At this time there does not appear to be any evidence of an acute emergency medical condition and the patient appears stable for discharge with appropriate outpatient follow up. Diagnosis was discussed with patient who verbalizes understanding of care plan and is agreeable to discharge. I have discussed return precautions with patien who verbalizes understanding. Patient encouraged to follow-up with their PCP within  1 week. All questions answered.  Patient's case discussed with Dr. Philip Aspen who agrees with plan to discharge with follow-up.   Note: Portions of this report may have been transcribed using voice recognition software. Every effort was made to ensure accuracy; however, inadvertent computerized transcription errors may still be present.        Final Clinical Impression(s) / ED Diagnoses Final diagnoses:  Bad headache  Vertigo    Rx / DC Orders ED Discharge Orders     None         Nehemiah Massed 07/26/22 2054    Fransico Meadow, MD 07/27/22 [phone]

## 2022-07-27 ENCOUNTER — Telehealth: Payer: Self-pay

## 2022-07-27 NOTE — Transitions of Care (Post Inpatient/ED Visit) (Signed)
   07/27/2022  Name: Jessica Sanchez MRN: [id number] DOB: [birth year]  Today's TOC FU Call Status: Today's TOC FU Call Status:: Successful TOC FU Call Competed TOC FU Call Complete Date: 07/27/22  Transition Care Management Follow-up Telephone Call Date of Discharge: 07/26/22 Discharge [facility] Type of Discharge: Emergency Department Reason for ED Visit: Other: (Bad headache) How have you been since you were released from the hospital?: Same Any questions or concerns?: No  Items Reviewed: Did you receive and understand the discharge instructions provided?: Yes Medications obtained and verified?: Yes (Medications Reviewed) Any new allergies since your discharge?: No Dietary orders reviewed?: Yes Do you have support at home?: Yes  Home Care and Equipment/Supplies: [place] Ordered?: NA Any new equipment or medical supplies ordered?: NA  Functional Questionnaire: Do you need assistance with bathing/showering or dressing?: No Do you need assistance with meal preparation?: No Do you need assistance with eating?: No Do you have difficulty maintaining continence: No Do you need assistance with getting out of bed/getting out of a chair/moving?: No Do you have difficulty managing or taking your medications?: No  Follow up appointments reviewed: PCP Follow-up appointment confirmed?: No (pt will call back friday to make fu appt if she feels like she needs it) MD Provider Line Number:[phone] Given: Yes [facility] Follow-up appointment confirmed?: No Do you need transportation to your follow-up appointment?: No Do you understand care options if your condition(s) worsen?: Yes-patient verbalized understanding    [place] LPN [place] [phone]

## 2022-08-11 ENCOUNTER — Encounter (INDEPENDENT_AMBULATORY_CARE_PROVIDER_SITE_OTHER): Payer: 59 | Admitting: Family Medicine

## 2022-08-17 ENCOUNTER — Ambulatory Visit (HOSPITAL_COMMUNITY): Payer: No Typology Code available for payment source | Attending: Family Medicine

## 2022-08-19 ENCOUNTER — Ambulatory Visit: Payer: No Typology Code available for payment source | Admitting: Pulmonary Disease

## 2022-09-23 ENCOUNTER — Encounter (INDEPENDENT_AMBULATORY_CARE_PROVIDER_SITE_OTHER): Payer: 59 | Admitting: Internal Medicine

## 2022-09-24 ENCOUNTER — Encounter: Payer: Self-pay | Admitting: Emergency Medicine

## 2022-09-24 ENCOUNTER — Other Ambulatory Visit: Payer: Self-pay

## 2022-09-24 ENCOUNTER — Ambulatory Visit
Admission: EM | Admit: 2022-09-24 | Discharge: 2022-09-24 | Disposition: A | Discharge status: Home / Self Care | Payer: No Typology Code available for payment source | Attending: Nurse Practitioner | Admitting: Nurse Practitioner

## 2022-09-24 DIAGNOSIS — N939 Abnormal uterine and vaginal bleeding, unspecified: Secondary | ICD-10-CM | POA: Diagnosis not present

## 2022-09-24 DIAGNOSIS — D219 Benign neoplasm of connective and other soft tissue, unspecified: Secondary | ICD-10-CM

## 2022-09-24 LAB — POCT URINE PREGNANCY: Preg Test, Ur: NEGATIVE

## 2022-09-24 MED ORDER — KETOROLAC TROMETHAMINE 30 MG/ML IJ SOLN
30.0000 mg | Freq: Once | INTRAMUSCULAR | Status: AC
  Administered 2022-09-24: 30 mg via INTRAMUSCULAR

## 2022-09-24 NOTE — ED Triage Notes (Signed)
Pt reports "heavy" vaginal bleeding with intermittent clots since this am. Pt reports period started x2 days ago. Pt states "I'm changing a pad every 30 minutes."

## 2022-09-24 NOTE — Discharge Instructions (Addendum)
Urine pregnancy test is negative. Because you are experiencing heavier than usual menstrual bleeding, and you are taking medication to stop heavy flow, I recommend that you go to the emergency department for further evaluation.

## 2022-09-24 NOTE — ED Provider Notes (Signed)
[facility]    CSN: [id number] Arrival date & time: 09/24/22  1607      History   Chief Complaint Chief Complaint  Patient presents with  . Vaginal Bleeding    HPI Jessica Sanchez is a 37 y.o. female.   The history is provided by the patient.   The patient presents for complaints of heavy vaginal bleeding.  Patient reports she has a history of heavy menses, but today she began clotting along with saturating at least 1 pad every 30 minutes.  Patient reports that the blood clots were " pretty large".  Patient also endorses abdominal pain, she describes it as cramping and like there is something "twisting" on the inside.  Patient denies fever, chills, chest pain, nausea, vomiting, or diarrhea.  Patient reports that she had a uterine ablation last August.  She states the last time she saw gynecology was earlier this year.  Patient states that she is currently taking Myfembree for abnormal uterine bleeding.  She states that she took the medication today, but it has not slowed her bleeding.  Patient states that she did experience an episode of dizziness.  Past Medical History:  Diagnosis Date  . Angio-edema   . Arthritis    right shoulder  . Asthma   . Dyspnea   . Dysrhythmia    hx of SVT  . Fibroid (bleeding) (uterine)   . Fibroid, uterine   . H/O metrorrhagia   . Hypertension   . IUD (intrauterine device) in place 07/11/2015  . Menorrhagia   . Obesity, Class III, BMI 40-49.9 (morbid obesity) (HCC) 11/11/2018  . Sleep apnea    uses CPAP - setting 11  . SVT (supraventricular tachycardia)     Patient Active Problem List   Diagnosis Date Noted  . Headache 07/01/2022  . Screening for STD (sexually transmitted disease) 03/30/2022  . Abdominal pain 03/02/2022  . RUQ pain 02/23/2022  . Abnormal abdominal CT scan 02/23/2022  . Constipation 02/11/2022  . Mild persistent asthma without complication 02/03/2022  . Chronic urticaria 02/03/2022  . Seasonal and perennial  allergic rhinitis 02/03/2022  . Anaphylactic shock due to adverse food reaction 02/03/2022  . Need for immunization against influenza 01/27/2022  . OSA (obstructive sleep apnea) 11/23/2021  . Otitis media of left ear 10/26/2021  . Peripheral edema 10/26/2021  . Allergy 10/26/2021  . Encounter for IUD removal   . Menorrhagia 09/04/2020  . Elevated troponin   . Attempted IUD removal, unsuccessful 07/05/2019  . Encounter for IUD insertion 07/05/2019  . Gastroesophageal reflux disease   . Nonspecific chest pain 11/11/2018  . Morbid obesity with body mass index (BMI) of 60.0 to 69.9 in adult (HCC) 11/11/2018  . Elevated d-dimer 11/11/2018  . SVT (supraventricular tachycardia) 10/04/2018  . IUD (intrauterine device) in place 07/11/2015  . Other disorder of menstruation and other abnormal bleeding from female genital tract 10/30/2012    Past Surgical History:  Procedure Laterality Date  . DILATATION AND CURETTAGE/HYSTEROSCOPY WITH MINERVA N/A 12/01/2021   Procedure: DILATATION AND CURETTAGE/HYSTEROSCOPY WITH MINERVA;  Surgeon: Lazaro Arms, MD;  Location: [facility];  Service: Gynecology;  Laterality: N/A;  . HYSTEROSCOPY N/A 09/17/2020   Procedure: HYSTEROSCOPY;  Surgeon: Lazaro Arms, MD;  Location: [facility];  Service: Gynecology;  Laterality: N/A;  . HYSTEROSCOPY WITH D & C N/A 09/27/2013   Procedure: DILATATION AND CURETTAGE /HYSTEROSCOPY and insertion of Mirena IUD ;  Surgeon: Freddrick March. Tenny Craw, MD;  Location: [facility];  Service:  Gynecology;  Laterality: N/A;  . IUD REMOVAL N/A 09/17/2020   Procedure: INTRAUTERINE DEVICE (IUD) REMOVAL (2);  Surgeon: Lazaro Arms, MD;  Location: [facility];  Service: Gynecology;  Laterality: N/A;  . MYOMECTOMY  02/03/2011   Procedure: MYOMECTOMY;  Surgeon: Lazaro Arms, MD;  Location: [facility];  Service: Gynecology;  Laterality: N/A;  . SVT ABLATION N/A 11/08/2018   Procedure: SVT ABLATION;  Surgeon: Marinus Maw, MD;  Location: [facility] INVASIVE CV LAB;  Service:  Cardiovascular;  Laterality: N/A;  . SVT ABLATION N/A 12/26/2020   Procedure: SVT ABLATION;  Surgeon: Marinus Maw, MD;  Location: [facility] INVASIVE CV LAB;  Service: Cardiovascular;  Laterality: N/A;  . TONSILLECTOMY    . TYMPANOSTOMY TUBE PLACEMENT      OB History     Gravida  1   Para      Term      Preterm      AB  1   Living         SAB  1   IAB      Ectopic      Multiple      Live Births               Home Medications    Prior to Admission medications   Medication Sig Start Date End Date Taking? Authorizing Provider  albuterol (VENTOLIN HFA) 108 (90 Base) MCG/ACT inhaler Inhale 2 puffs into the lungs every 6 (six) hours as needed for wheezing or shortness of breath. 10/26/21   Gilmore Laroche, FNP  fluticasone furoate-vilanterol (BREO ELLIPTA) 100-25 MCG/ACT AEPB Inhale 1 puff into the lungs daily.    [provider]  furosemide (LASIX) 20 MG tablet Take 1 tablet (20 mg total) by mouth daily. 11/30/21   Gilmore Laroche, FNP  levocetirizine (XYZAL) 5 MG tablet Take 1 tablet (5 mg total) by mouth in the morning and at bedtime. 02/03/22   Hetty Blend, FNP  meclizine (ANTIVERT) 12.5 MG tablet Take 1 tablet (12.5 mg total) by mouth 3 (three) times daily as needed for dizziness. 07/26/22   Gilmore Laroche, FNP  Multiple Vitamins-Calcium (ONE-A-DAY WOMENS PO) Take 1 tablet by mouth daily.    [provider]  potassium chloride SA (KLOR-CON M) 20 MEQ tablet Take 1 tablet (20 mEq total) by mouth 2 (two) times daily. 11/18/21   Kerri Perches, MD  Relugolix-Estradiol-Norethind (MYFEMBREE) 40-1-0.5 MG TABS Take 1 tablet by mouth daily. 06/02/22   Lazaro Arms, MD  Rimegepant Sulfate (NURTEC) 75 MG TBDP Take 1 tablet (75 mg total) by mouth as needed. Patient taking differently: Take 1 tablet by mouth as needed (migraines). 07/01/22   Gilmore Laroche, FNP  Vitamin D, Ergocalciferol, (DRISDOL) 1.25 MG (50000 UNIT) CAPS capsule Take 1 capsule (50,000 Units  total) by mouth every 7 (seven) days. 04/13/22   Gilmore Laroche, FNP  apixaban (ELIQUIS) 5 MG TABS tablet Take 2 tablets (10mg ) twice daily for 7 days, then 1 tablet (5mg ) twice daily 04/20/19 04/20/19  Michela Pitcher A, PA-C  levonorgestrel (MIRENA) 20 MCG/24HR IUD 1 each by Intrauterine route once.   06/25/20  [provider]    Family History Family History  Problem Relation Age of Onset  . Cirrhosis Mother   . Diabetes Mother   . Cancer Maternal Grandfather   . Hypertension Sister   . Diabetes Sister   . Anesthesia problems Neg Hx   . Hypotension Neg Hx   . Malignant hyperthermia Neg Hx   .  Pseudochol deficiency Neg Hx     Social History Social History   Tobacco Use  . Smoking status: Never  . Smokeless tobacco: Never  . Tobacco comments:    socially  Vaping Use  . Vaping Use: Never used  Substance Use Topics  . Alcohol use: Yes    Comment: occasional  . Drug use: Yes    Types: Marijuana    Comment: on occ     Allergies   Shellfish allergy, Sulfa antibiotics, and Adhesive [tape]   Review of Systems Review of Systems Per HPI  Physical Exam Triage Vital Signs ED Triage Vitals  Enc Vitals Group     BP 09/24/22 1656 123/81     Pulse Rate 09/24/22 1656 78     Resp 09/24/22 1656 20     Temp 09/24/22 1656 98.1 F (36.7 C)     Temp Source 09/24/22 1656 Oral     SpO2 09/24/22 1656 96 %     Weight --      Height --      Head Circumference --      Peak Flow --      Pain Score 09/24/22 1654 10     Pain Loc --      Pain Edu? --      Excl. in GC? --    No data found.  Updated Vital Signs BP 123/81 (BP Location: Right Arm)   Pulse 78   Temp 98.1 F (36.7 C) (Oral)   Resp 20   LMP 09/22/2022   SpO2 96%   Visual Acuity Right Eye Distance:   Left Eye Distance:   Bilateral Distance:    Right Eye Near:   Left Eye Near:    Bilateral Near:     Physical Exam Vitals and nursing note reviewed.  Constitutional:      General: She is not in acute  distress.    Appearance: Normal appearance.  Eyes:     Extraocular Movements: Extraocular movements intact.     Conjunctiva/sclera: Conjunctivae normal.     Pupils: Pupils are equal, round, and reactive to light.  Cardiovascular:     Rate and Rhythm: Normal rate and regular rhythm.     Pulses: Normal pulses.     Heart sounds: Normal heart sounds.  Pulmonary:     Effort: Pulmonary effort is normal. No respiratory distress.     Breath sounds: Normal breath sounds. No stridor. No wheezing, rhonchi or rales.  Abdominal:     General: Bowel sounds are normal.     Palpations: Abdomen is soft.  Musculoskeletal:     Cervical back: Normal range of motion.  Skin:    General: Skin is warm and dry.  Neurological:     General: No focal deficit present.     Mental Status: She is alert and oriented to person, place, and time.  Psychiatric:        Mood and Affect: Mood normal.        Behavior: Behavior normal.     UC Treatments / Results  Labs (all labs ordered are listed, but only abnormal results are displayed) Labs Reviewed  POCT URINE PREGNANCY    EKG   Radiology No results found.  Procedures Procedures (including critical care time)  Medications Ordered in UC Medications  ketorolac (TORADOL) 30 MG/ML injection 30 mg (30 mg Intramuscular Given 09/24/22 1748)    Initial Impression / Assessment and Plan / UC Course  I have reviewed the triage vital signs and  the nursing notes.  Pertinent labs & imaging results that were available during my care of the patient were reviewed by me and considered in my medical decision making (see chart for details).  Urine pregnancy is negative.  Patient presents for complaints of heavy vaginal bleeding.  She has an ablation in August 2023 for the same.  Patient is currently taking Myfembree, and she is continuing to experience heavy vaginal bleeding.  Patient is saturating 1 pad every 30 minutes.  She states that she is also wearing a pull-up.   Patient endorses abdominal pain and pelvic pain.  She states that the pain feels like cramping and like something is "twisting on the inside".  Given the patient's history, the fact that she is currently taking medications for the symptoms with no relief, and the frequency which she is saturating pads, patient was advised that it is recommended that she go to the emergency department for further evaluation and workup.  Patient's vital signs are stable, she is able to travel via private vehicle.  Patient verbalizes understanding, patient stable for discharge to ED.   Final Clinical Impressions(s) / UC Diagnoses   Final diagnoses:  Abnormal vaginal bleeding  Fibroids     Discharge Instructions      Urine pregnancy test is negative. Because you are experiencing heavier than usual menstrual bleeding, and you are taking medication to stop heavy flow, I recommend that you go to the emergency department for further evaluation.      ED Prescriptions   None    PDMP not reviewed this encounter.   Abran Cantor, NP 09/24/22 1945

## 2022-09-28 ENCOUNTER — Encounter: Payer: Self-pay | Admitting: Family Medicine

## 2022-09-28 ENCOUNTER — Ambulatory Visit (INDEPENDENT_AMBULATORY_CARE_PROVIDER_SITE_OTHER): Payer: No Typology Code available for payment source | Admitting: Family Medicine

## 2022-09-28 VITALS — BP 121/77 | HR 81 | Wt 343.1 lb

## 2022-09-28 DIAGNOSIS — I872 Venous insufficiency (chronic) (peripheral): Secondary | ICD-10-CM

## 2022-09-28 DIAGNOSIS — N939 Abnormal uterine and vaginal bleeding, unspecified: Secondary | ICD-10-CM | POA: Diagnosis not present

## 2022-09-28 DIAGNOSIS — N920 Excessive and frequent menstruation with regular cycle: Secondary | ICD-10-CM

## 2022-09-28 DIAGNOSIS — G629 Polyneuropathy, unspecified: Secondary | ICD-10-CM | POA: Insufficient documentation

## 2022-09-28 DIAGNOSIS — E538 Deficiency of other specified B group vitamins: Secondary | ICD-10-CM

## 2022-09-28 DIAGNOSIS — G609 Hereditary and idiopathic neuropathy, unspecified: Secondary | ICD-10-CM

## 2022-09-28 DIAGNOSIS — E559 Vitamin D deficiency, unspecified: Secondary | ICD-10-CM

## 2022-09-28 DIAGNOSIS — R42 Dizziness and giddiness: Secondary | ICD-10-CM

## 2022-09-28 MED ORDER — GABAPENTIN 100 MG PO CAPS: 100.0000 mg | ORAL_CAPSULE | Freq: Every day | ORAL | 1 refills | Status: DC

## 2022-09-28 MED ORDER — VITAMIN D (ERGOCALCIFEROL) 1.25 MG (50000 UNIT) PO CAPS: 50000.0000 [IU] | ORAL_CAPSULE | ORAL | 2 refills | Status: DC

## 2022-09-28 MED ORDER — MECLIZINE HCL 12.5 MG PO TABS: 12.5000 mg | ORAL_TABLET | Freq: Three times a day (TID) | ORAL | 0 refills | Status: DC | PRN

## 2022-09-28 NOTE — Assessment & Plan Note (Addendum)
The patient followed-up with vascular surgery on 01/25/2022 It was noted that she was a candidate for venous ablation. However, due to her body habitus she was not be a good candidate for the ablation  due to depth of the veins in her leg.  Recommend  daily elevation of 20-30 minutes above level of heart, daily compression 20-30 mmHg stocking use, exercise, weight reduction, refraining from prolonged sitting or standing.

## 2022-09-28 NOTE — Patient Instructions (Addendum)
I appreciate the opportunity to provide care to you today!    Follow up:  1 month  Labs: please stop by the lab today to get your blood drawn (CBC, BMP, Vit B12, Vit D)  daily elevation of 20-30 minutes above level of heart, daily compression 20-30 mmHg stocking use, exercise, weight reduction, refraining from prolonged sitting or standing.     Peripheral neuropathy Please start taking gabapentin 100 mg QHS  Abnormal Bleeding Pending CBC Please follow up with Dr. Despina Hidden as soon as possible      Please continue to a heart-healthy diet and increase your physical activities. Try to exercise for at least five days a week.      It was a pleasure to see you and I look forward to continuing to work together on your health and well-being. Please do not hesitate to call the office if you need care or have questions about your care.   Have a wonderful day and week. With Gratitude, Gilmore Laroche MSN, FNP-BC

## 2022-09-28 NOTE — Assessment & Plan Note (Signed)
Complains of paresthesia in her hands and feet Will start a trial of gabapentin 100 mg nightly and follow-up in 4 weeks No systematic symptoms reported No neurological deficits seen on physical examination

## 2022-09-28 NOTE — Progress Notes (Signed)
Established Patient Office Visit  Subjective:  Patient ID: Jessica Sanchez, female    DOB: [birth year]  Age: 37 y.o. MRN: [id number]  CC:  Chief Complaint  Patient presents with   Chronic Care Management    3 month f/u. Pt reports ankle swelling on both legs, sx of tingling. Also having right hand numbness and tingling on forearm to her wrist.    Menstrual Problem    Pt reports bleeding concerns from last week, would like to repeat labs.     HPI Jessica Sanchez is a 37 y.o. female with past medical history of abnormal uterine bleeding, and bottlefeeding presents for f/u of  chronic medical conditions. For the details of today's visit, please refer to the assessment and plan.     Past Medical History:  Diagnosis Date   Angio-edema    Arthritis    right shoulder   Asthma    Dyspnea    Dysrhythmia    hx of SVT   Fibroid (bleeding) (uterine)    Fibroid, uterine    H/O metrorrhagia    Hypertension    IUD (intrauterine device) in place 07/11/2015   Menorrhagia    Obesity, Class III, BMI 40-49.9 (morbid obesity) (HCC) 11/11/2018   Sleep apnea    uses CPAP - setting 11   SVT (supraventricular tachycardia)     Past Surgical History:  Procedure Laterality Date   DILATATION AND CURETTAGE/HYSTEROSCOPY WITH MINERVA N/A 12/01/2021   Procedure: DILATATION AND CURETTAGE/HYSTEROSCOPY WITH MINERVA;  Surgeon: Lazaro Arms, MD;  Location: [facility];  Service: Gynecology;  Laterality: N/A;   HYSTEROSCOPY N/A 09/17/2020   Procedure: HYSTEROSCOPY;  Surgeon: Lazaro Arms, MD;  Location: [facility];  Service: Gynecology;  Laterality: N/A;   HYSTEROSCOPY WITH D & C N/A 09/27/2013   Procedure: DILATATION AND CURETTAGE /HYSTEROSCOPY and insertion of Mirena IUD ;  Surgeon: Freddrick March. Tenny Craw, MD;  Location: [facility];  Service: Gynecology;  Laterality: N/A;   IUD REMOVAL N/A 09/17/2020   Procedure: INTRAUTERINE DEVICE (IUD) REMOVAL (2);  Surgeon: Lazaro Arms, MD;  Location: [facility];  Service: Gynecology;   Laterality: N/A;   MYOMECTOMY  02/03/2011   Procedure: MYOMECTOMY;  Surgeon: Lazaro Arms, MD;  Location: [facility];  Service: Gynecology;  Laterality: N/A;   SVT ABLATION N/A 11/08/2018   Procedure: SVT ABLATION;  Surgeon: Marinus Maw, MD;  Location: [facility] INVASIVE CV LAB;  Service: Cardiovascular;  Laterality: N/A;   SVT ABLATION N/A 12/26/2020   Procedure: SVT ABLATION;  Surgeon: Marinus Maw, MD;  Location: [facility] INVAS[facility] CV LAB;  Service: Cardiovascular;  Laterality: N/A;   TONSILLECTOMY     TYMPANOSTOMY TUBE PLACEMENT      Family History  Problem Relation Age of Onset   Cirrhosis Mother    Diabetes Mother    Cancer Maternal Grandfather    Hypertension Sister    Diabetes Sister    Anesthesia problems Neg Hx    Hypotension Neg Hx    Malignant hyperthermia Neg Hx    Pseudochol deficiency Neg Hx     Social History   Socioeconomic History   Marital status: Single    Spouse name: Not on file   Number of children: Not on file   Years of education: Not on file   Highest education level: Not on file  Occupational History   Not on file  Tobacco Use   Smoking status: Never   Smokeless tobacco: Never   Tobacco comments:  socially  Vaping Use   Vaping Use: Never used  Substance and Sexual Activity   Alcohol use: Yes    Comment: occasional   Drug use: Yes    Types: Marijuana    Comment: on occ   Sexual activity: Not Currently    Birth control/protection: Condom  Other Topics Concern   Not on file  Social History Narrative   Not on file   Social Determinants of Health   Financial Resource Strain: Not on file  Food Insecurity: Not on file  Transportation Needs: Not on file  Physical Activity: Not on file  Stress: Not on file  Social Connections: Not on file  Intimate Partner Violence: Not on file    Outpatient Medications Prior to Visit  Medication Sig Dispense Refill   albuterol (VENTOLIN HFA) 108 (90 Base) MCG/ACT inhaler Inhale 2 puffs into the lungs every  6 (six) hours as needed for wheezing or shortness of breath. 8 g 0   fluticasone furoate-vilanterol (BREO ELLIPTA) 100-25 MCG/ACT AEPB Inhale 1 puff into the lungs daily.     furosemide (LASIX) 20 MG tablet Take 1 tablet (20 mg total) by mouth daily. 30 tablet 0   levocetirizine (XYZAL) 5 MG tablet Take 1 tablet (5 mg total) by mouth in the morning and at bedtime. 60 tablet 0   Multiple Vitamins-Calcium (ONE-A-DAY WOMENS PO) Take 1 tablet by mouth daily.     potassium chloride SA (KLOR-CON M) 20 MEQ tablet Take 1 tablet (20 mEq total) by mouth 2 (two) times daily. 40 tablet 0   Relugolix-Estradiol-Norethind (MYFEMBREE) 40-1-0.5 MG TABS Take 1 tablet by mouth daily. 30 tablet 11   meclizine (ANTIVERT) 12.5 MG tablet Take 1 tablet (12.5 mg total) by mouth 3 (three) times daily as needed for dizziness. 30 tablet 0   Vitamin D, Ergocalciferol, (DRISDOL) 1.25 MG (50000 UNIT) CAPS capsule Take 1 capsule (50,000 Units total) by mouth every 7 (seven) days. 7 capsule 2   Rimegepant Sulfate (NURTEC) 75 MG TBDP Take 1 tablet (75 mg total) by mouth as needed. (Patient not taking: Reported on 09/28/2022) 30 tablet 1   No facility-administered medications prior to visit.    Allergies  Allergen Reactions   Shellfish Allergy Shortness Of Breath, Itching and Swelling    Mouth became swollen, throat started itching, and developed shortness of breath   Sulfa Antibiotics Itching, Shortness Of Breath and Swelling    Mouth became swollen, throat started itching, and developed shortness of breath   Adhesive [Tape] Itching    Depends on the type of medical tape    ROS Review of Systems  Constitutional:  Negative for chills and fever.  Eyes:  Negative for visual disturbance.  Respiratory:  Negative for chest tightness and shortness of breath.   Cardiovascular:  Positive for leg swelling.  Genitourinary:  Positive for menstrual problem.  Neurological:  Positive for numbness. Negative for dizziness and headaches.       Objective:    Physical Exam HENT:     Head: Normocephalic.     Mouth/Throat:     Mouth: Mucous membranes are moist.  Cardiovascular:     Rate and Rhythm: Normal rate.     Heart sounds: Normal heart sounds.  Pulmonary:     Effort: Pulmonary effort is normal.     Breath sounds: Normal breath sounds.  Musculoskeletal:     Right lower leg: Edema present.     Left lower leg: Edema present.  Neurological:     Mental  Status: She is alert and oriented to person, place, and time.     Motor: No weakness.     BP 121/77   Pulse 81   Wt (!) 343 lb 1.3 oz (155.6 kg)   LMP 09/22/2022   SpO2 93%   BMI 64.82 kg/m  Wt Readings from Last 3 Encounters:  09/28/22 (!) 343 lb 1.3 oz (155.6 kg)  07/26/22 (!) 341 lb (154.7 kg)  07/08/22 (!) 347 lb 9.6 oz (157.7 kg)    Lab Results  Component Value Date   TSH 1.310 03/30/2022   Lab Results  Component Value Date   WBC 5.2 07/26/2022   HGB 11.7 (L) 07/26/2022   HCT 37.6 07/26/2022   MCV 85.1 07/26/2022   PLT 307 07/26/2022   Lab Results  Component Value Date   NA 135 07/26/2022   K 4.0 07/26/2022   CO2 25 07/26/2022   GLUCOSE 95 07/26/2022   BUN 12 07/26/2022   CREATININE 0.56 07/26/2022   BILITOT 0.6 07/26/2022   ALKPHOS 56 07/26/2022   AST 16 07/26/2022   ALT 16 07/26/2022   PROT 7.2 07/26/2022   ALBUMIN 3.6 07/26/2022   CALCIUM 8.4 (L) 07/26/2022   ANIONGAP 6 07/26/2022   EGFR 115 11/27/2021   Lab Results  Component Value Date   CHOL 135 03/30/2022   Lab Results  Component Value Date   HDL 33 (L) 03/30/2022   Lab Results  Component Value Date   LDLCALC 93 03/30/2022   Lab Results  Component Value Date   TRIG 35 03/30/2022   Lab Results  Component Value Date   CHOLHDL 4.1 03/30/2022   Lab Results  Component Value Date   HGBA1C 5.9 (H) 03/30/2022      Assessment & Plan:  Chronic venous insufficiency of lower extremity Assessment & Plan: The patient followed-up with vascular surgery on  01/25/2022 It was noted that she was a candidate for venous ablation. However, due to her body habitus she was not be a good candidate for the ablation  due to depth of the veins in her leg.  Recommend  daily elevation of 20-30 minutes above level of heart, daily compression 20-30 mmHg stocking use, exercise, weight reduction, refraining from prolonged sitting or standing.     Menorrhagia with regular cycle Assessment & Plan: Occurs during her menses She is not currently on her menses today She complains of menorrhagia and blood clots while on her cycle She is currently on Myfembree 40-1-05 mg tablet daily to control her symptoms however she reports minimal relief Encouraged to follow-up with Dr. Despina Hidden as soon as possible Asymptomatic today in the clinic Pending CBC   Abnormal uterine bleeding (AUB) -     CBC with Differential/Platelet -     BMP8+EGFR  Idiopathic peripheral neuropathy Assessment & Plan: Complains of paresthesia in her hands and feet Will start a trial of gabapentin 100 mg nightly and follow-up in 4 weeks No systematic symptoms reported No neurological deficits seen on physical examination  Orders: -     Gabapentin; Take 1 capsule (100 mg total) by mouth at bedtime.  Dispense: 90 capsule; Refill: 1  Vitamin D deficiency -     Vitamin D (Ergocalciferol); Take 1 capsule (50,000 Units total) by mouth every 7 (seven) days.  Dispense: 20 capsule; Refill: 2 -     VITAMIN D 25 Hydroxy (Vit-D Deficiency, Fractures)  Vertigo -     Meclizine HCl; Take 1 tablet (12.5 mg total) by mouth 3 (three)  times daily as needed for dizziness.  Dispense: 30 tablet; Refill: 0  Vitamin B12 deficiency -     Vitamin B12   Note: This chart has been completed using Engineer, civil (consulting) software, and while attempts have been made to ensure accuracy, certain words and phrases may not be transcribed as intended.    Follow-up: Return in about 4 weeks (around 10/26/2022).   Gilmore Laroche, FNP

## 2022-09-28 NOTE — Assessment & Plan Note (Addendum)
Occurs during her menses She is not currently on her menses today She complains of menorrhagia and blood clots while on her cycle She is currently on Myfembree 40-1-05 mg tablet daily to control her symptoms however she reports minimal relief Encouraged to follow-up with Dr. Despina Hidden as soon as possible Asymptomatic today in the clinic Pending CBC

## 2022-09-29 LAB — BMP8+EGFR
BUN/Creatinine Ratio: 23 (ref 9–23)
BUN: 15 mg/dL (ref 6–20)
CO2: 23 mmol/L (ref 20–29)
Calcium: 9.1 mg/dL (ref 8.7–10.2)
Chloride: 103 mmol/L (ref 96–106)
Creatinine, Ser: 0.64 mg/dL (ref 0.57–1.00)
Glucose: 90 mg/dL (ref 70–99)
Potassium: 4.4 mmol/L (ref 3.5–5.2)
Sodium: 139 mmol/L (ref 134–144)
eGFR: 117 mL/min/{1.73_m2} (ref >59)

## 2022-09-29 LAB — CBC WITH DIFFERENTIAL/PLATELET
Basophils Absolute: 0 10*3/uL (ref 0.0–0.2)
Basos: 0 %
EOS (ABSOLUTE): 0.2 10*3/uL (ref 0.0–0.4)
Eos: 3 %
Hematocrit: 37.2 % (ref 34.0–46.6)
Hemoglobin: 11.8 g/dL (ref 11.1–15.9)
Immature Grans (Abs): 0 10*3/uL (ref 0.0–0.1)
Immature Granulocytes: 1 %
Lymphocytes Absolute: 2 10*3/uL (ref 0.7–3.1)
Lymphs: 38 %
MCH: 26.3 pg — ABNORMAL LOW (ref 26.6–33.0)
MCHC: 31.7 g/dL (ref 31.5–35.7)
MCV: 83 fL (ref 79–97)
Monocytes Absolute: 0.5 10*3/uL (ref 0.1–0.9)
Monocytes: 8 %
Neutrophils Absolute: 2.7 10*3/uL (ref 1.4–7.0)
Neutrophils: 50 %
Platelets: 273 10*3/uL (ref 150–450)
RBC: 4.48 x10E6/uL (ref 3.77–5.28)
RDW: 14.8 % (ref 11.7–15.4)
WBC: 5.4 10*3/uL (ref 3.4–10.8)

## 2022-09-29 LAB — VITAMIN B12: Vitamin B-12: 424 pg/mL (ref 232–1245)

## 2022-09-29 NOTE — Progress Notes (Signed)
Please inform the patient that her vitamin B12, electrolytes, hemoglobin and hematocrit are within normal limits.

## 2022-09-30 ENCOUNTER — Ambulatory Visit: Payer: No Typology Code available for payment source | Admitting: Family Medicine

## 2022-10-27 ENCOUNTER — Encounter: Payer: Self-pay | Admitting: Family Medicine

## 2022-10-27 ENCOUNTER — Ambulatory Visit (INDEPENDENT_AMBULATORY_CARE_PROVIDER_SITE_OTHER): Payer: No Typology Code available for payment source | Admitting: Family Medicine

## 2022-10-27 ENCOUNTER — Other Ambulatory Visit: Payer: Self-pay | Admitting: Family Medicine

## 2022-10-27 ENCOUNTER — Other Ambulatory Visit: Payer: Self-pay

## 2022-10-27 VITALS — BP 128/70 | HR 92 | Temp 98.6°F | Resp 16 | Ht 62.0 in | Wt 342.1 lb

## 2022-10-27 DIAGNOSIS — T7800XD Anaphylactic reaction due to unspecified food, subsequent encounter: Secondary | ICD-10-CM

## 2022-10-27 DIAGNOSIS — R0602 Shortness of breath: Secondary | ICD-10-CM

## 2022-10-27 DIAGNOSIS — L508 Other urticaria: Secondary | ICD-10-CM

## 2022-10-27 DIAGNOSIS — J3089 Other allergic rhinitis: Secondary | ICD-10-CM

## 2022-10-27 DIAGNOSIS — J302 Other seasonal allergic rhinitis: Secondary | ICD-10-CM | POA: Diagnosis not present

## 2022-10-27 DIAGNOSIS — J4540 Moderate persistent asthma, uncomplicated: Secondary | ICD-10-CM | POA: Diagnosis not present

## 2022-10-27 MED ORDER — MONTELUKAST SODIUM 10 MG PO TABS: 10.0000 mg | ORAL_TABLET | Freq: Every day | ORAL | 2 refills | Status: DC

## 2022-10-27 MED ORDER — FLUTICASONE FUROATE-VILANTEROL 200-25 MCG/ACT IN AEPB: 1.0000 | INHALATION_SPRAY | Freq: Every day | RESPIRATORY_TRACT | 5 refills | Status: DC

## 2022-10-27 MED ORDER — FAMOTIDINE 20 MG PO TABS: 20.0000 mg | ORAL_TABLET | Freq: Two times a day (BID) | ORAL | 2 refills | Status: DC | PRN

## 2022-10-27 MED ORDER — CETIRIZINE HCL 10 MG PO TABS: 10.0000 mg | ORAL_TABLET | Freq: Two times a day (BID) | ORAL | 2 refills | Status: DC | PRN

## 2022-10-27 NOTE — Patient Instructions (Addendum)
Asthma Given Breo 200-1 puff once a day to prevent cough or wheeze.  This will replace Breo 100 for now Continue montelukast 10 mg once a day to prevent cough or wheeze. Patient cautioned that rarely some children/adults can experience behavioral changes after beginning montelukast. These side effects are rare, however, if you notice any change, notify the clinic and discontinue montelukast.  Continue Xopenex 2 puffs once every 4-6 hours as needed for cough or wheeze.  This will replace albuterol You may use Xopenex 2 puffs 5 to 15 minutes before activity to decrease cough or wheeze.  This will replace albuterol  Allergic rhinitis Continue allergen avoidance measures directed toward grass pollen, weed pollen, ragweed pollen, tree pollen, mold, dust mite, cat, dog, cockroach, and tobacco as listed below Continue cetirizine 10 mg once a day as needed for runny nose or itch Continue montelukast 10 mg once a day as listed above to help reduce allergy symptoms Continue Flonase 2 sprays in each nostril once a day as needed for stuffy nose Consider saline nasal rinses as needed for nasal symptoms. Use this before any medicated nasal sprays for best result Consider allergen immunotherapy if your symptoms are not well controlled with the treatment plan as listed below.  Written information provided at today's visit  Hives (urticaria) Use the fewest amount of medications Cetirizine (Zyrtec) 10mg  twice a day and famotidine (Pepcid) 20 mg twice a day. If no symptoms for 7-14 days then decrease to. Cetirizine (Zyrtec) 10mg  twice a day and famotidine (Pepcid) 20 mg once a day.  If no symptoms for 7-14 days then decrease to. Cetirizine (Zyrtec) 10mg  twice a day.  If no symptoms for 7-14 days then decrease to. Cetirizine (Zyrtec) 10mg  once a day.  May use Benadryl (diphenhydramine) as needed for breakthrough hives       If symptoms return, then step up dosage Keep a detailed symptom journal including foods  eaten, contact with allergens, medications taken, weather changes.   Food allergy Continue to avoid shellfish. In case of an allergic reaction, take Benadryl 50 mg capsules every 6 hours, and if life-threatening symptoms occur, inject with EpiPen 0.3 mg. Complete the labs that were ordered at your last visit to this clinic to help [place] evaluate and treat your food allergy  Call the clinic if this treatment plan is not working well for you.  Follow up in 3 months or sooner if needed.  Reducing Pollen Exposure The American Academy of Allergy, Asthma and Immunology suggests the following steps to reduce your exposure to pollen during allergy seasons. Do not hang sheets or clothing out to dry; pollen may collect on these items. Do not mow lawns or spend time around freshly cut grass; mowing stirs up pollen. Keep windows closed at night.  Keep car windows closed while driving. Minimize morning activities outdoors, a time when pollen counts are usually at their highest. Stay indoors as much as possible when pollen counts or humidity is high and on windy days when pollen tends to remain in the air longer. Use air conditioning when possible.  Many air conditioners have filters that trap the pollen spores. Use a HEPA room air filter to remove pollen form the indoor air you breathe.   Control of Mold Allergen Mold and fungi can grow on a variety of surfaces provided certain temperature and moisture conditions exist.  Outdoor molds grow on plants, decaying vegetation and soil.  The major outdoor mold, Alternaria and Cladosporium, are found in very high numbers  during hot and dry conditions.  Generally, a late Summer - Fall peak is seen for common outdoor fungal spores.  Rain will temporarily lower outdoor mold spore count, but counts rise rapidly when the rainy period ends.  The most important indoor molds are Aspergillus and Penicillium.  Dark, humid and poorly ventilated basements are ideal sites for mold  growth.  The next most common sites of mold growth are the bathroom and the kitchen.  Outdoor [employer] Use air conditioning and keep windows closed Avoid exposure to decaying vegetation. Avoid leaf raking. Avoid grain handling. Consider wearing a face mask if working in moldy areas.  Indoor Mold Control Maintain humidity below 50%. Clean washable surfaces with 5% bleach solution. Remove sources e.g. Contaminated carpets.   Control of Dog or Cat Allergen Avoidance is the best way to manage a dog or cat allergy. If you have a dog or cat and are allergic to dog or cats, consider removing the dog or cat from the home. If you have a dog or cat but don't want to find it a new home, or if your family wants a pet even though someone in the household is allergic, here are some strategies that may help keep symptoms at bay:  Keep the pet out of your bedroom and restrict it to only a few rooms. Be advised that keeping the dog or cat in only one room will not limit the allergens to that room. Don't pet, hug or kiss the dog or cat; if you do, wash your hands with soap and water. High-efficiency particulate air (HEPA) cleaners run continuously in a bedroom or living room can reduce allergen levels over time. Regular use of a high-efficiency vacuum cleaner or a central vacuum can reduce allergen levels. Giving your dog or cat a bath at least once a week can reduce airborne allergen.   Control of Dust Mite Allergen Dust mites play a major role in allergic asthma and rhinitis. They occur in environments with high humidity wherever human skin is found. Dust mites absorb humidity from the atmosphere (ie, they do not drink) and feed on organic matter (including shed human and animal skin). Dust mites are a microscopic type of insect that you cannot see with the naked eye. High levels of dust mites have been detected from mattresses, pillows, carpets, upholstered furniture, bed covers, clothes, soft toys  and any woven material. The principal allergen of the dust mite is found in its feces. A gram of dust may contain 1,000 mites and 250,000 fecal particles. Mite antigen is easily measured in the air during house cleaning activities. Dust mites do not bite and do not cause harm to humans, other than by triggering allergies/asthma.  Ways to decrease your exposure to dust mites in your home:  1. Encase mattresses, box springs and pillows with a mite-impermeable barrier or cover  2. Wash sheets, blankets and drapes weekly in hot water (130 F) with detergent and dry them in a dryer on the hot setting.  3. Have the room cleaned frequently with a vacuum cleaner and a damp dust-mop. For carpeting or rugs, vacuuming with a vacuum cleaner equipped with a high-efficiency particulate air (HEPA) filter. The dust mite allergic individual should not be in a room which is being cleaned and should wait 1 hour after cleaning before going into the room.  4. Do not sleep on upholstered furniture (eg, couches).  5. If possible removing carpeting, upholstered furniture and drapery from the home is ideal.  Horizontal blinds should be eliminated in the rooms where the person spends the most time (bedroom, study, television room). Washable vinyl, roller-type shades are optimal.  6. Remove all non-washable stuffed toys from the bedroom. Wash stuffed toys weekly like sheets and blankets above.  7. Reduce indoor humidity to less than 50%. Inexpensive humidity monitors can be purchased at most hardware stores. Do not use a humidifier as can make the problem worse and are not recommended.  Control of Cockroach Allergen Cockroach allergen has been identified as an important cause of acute attacks of asthma, especially in urban settings.  There are fifty-five species of cockroach that exist in the [place], however only three, the [place], [place] species produce allergen that can affect patients with Asthma.   Allergens can be obtained from fecal particles, egg casings and secretions from cockroaches.    Remove food sources. Reduce access to water. Seal access and entry points. Spray runways with 0.5-1% Diazinon or Chlorpyrifos Blow boric acid power under stoves and refrigerator. Place bait stations (hydramethylnon) at feeding sites.

## 2022-10-27 NOTE — Progress Notes (Signed)
[address] [place] [address] Dept: [phone]  FOLLOW UP NOTE  Patient ID: Jessica Sanchez, female    DOB: [birth year]  Age: 37 y.o. MRN: [id number] Date of Office Visit: 10/27/2022  Assessment  Chief Complaint: Urticaria (Itching around her feer, back, face,legs and she notices it when she's outdoor/indoors. This spell has been going on for about a month. Wants to get allergy tested again. ) and Other (Clear bumps )  HPI Jessica Sanchez is a 37 year old female who presents to the clinic for a follow up visit. She was last seen in this clinic on 02-03-2022 by Thermon Leyland, FNP, for evaluation of asthma, allergic rhinitis, and urticaria.   At today's visit, she reports her asthma has been moderately well-controlled with symptoms including shortness of breath, wheeze, and occasional cough which is sometimes dry and sometimes produces clear mucus.  She reports the symptoms have been occurring for about the last month and are the worst when she is outside.  She continues montelukast 10 mg once a day, Breo 100-1 puff once a day.  She continues Xopenex about 3 days a week with relief of symptoms.    Allergic rhinitis is reported as poorly controlled with symptoms including nasal congestion occurring in the morning over the last 3 days, sneezing, and postnasal drainage.  She is not currently using an antihistamine, saline nasal rinse, or Flonase nasal spray.  Her last environmental allergy testing was on 12/21/2021 and was positive to pollens, mold, dust mites, pets, cockroach, and tobacco.  She is moderately interested in allergen immunotherapy.  Benefits and risks of allergen immunotherapy discussed thoroughly at today's visit.  Written information was provided.  She continues to experience hives and itch almost every day which began about 2 months ago.  She reports a significant increase in her symptoms over the last month.  She reports that she continues to experience hives which can  occur anywhere on her body almost daily.  She reports these hives last between 1 and 2 days before complete resolution.  She reports the hives occurring more frequently when she is outside, however, they can occur when she is inside as well.  She denies concomitant cardiopulmonary or gastrointestinal symptoms with these hives.  She did not complete the lab testing that was ordered at her last visit.  She continues to avoid shellfish with no accidental ingestion or EpiPen use since her last visit to this clinic.  She is interested in retesting for shellfish allergy at this time.  Her current medications are listed in the chart.   Drug Allergies:  Allergies  Allergen Reactions   Shellfish Allergy Shortness Of Breath, Itching and Swelling    Mouth became swollen, throat started itching, and developed shortness of breath   Sulfa Antibiotics Itching, Shortness Of Breath and Swelling    Mouth became swollen, throat started itching, and developed shortness of breath   Adhesive [Tape] Itching    Depends on the type of medical tape    Physical Exam: BP 128/70   Pulse 92   Temp 98.6 F (37 C)   Resp 16   Ht 5\' 2"  (1.575 m)   Wt (!) 342 lb 2 oz (155.2 kg)   SpO2 95%   BMI 62.58 kg/m    Physical Exam Vitals reviewed.  Constitutional:      Appearance: Normal appearance.  HENT:     Head: Normocephalic and atraumatic.     Right Ear: Tympanic membrane normal.  Left Ear: Tympanic membrane normal.     Nose:     Comments: Bilateral naris edematous and pale with thin clear nasal drainage noted.  Pharynx normal.  Ears normal.  Eyes normal.    Mouth/Throat:     Pharynx: Oropharynx is clear.  Eyes:     Conjunctiva/sclera: Conjunctivae normal.  Cardiovascular:     Rate and Rhythm: Normal rate and regular rhythm.     Heart sounds: Normal heart sounds. No murmur heard. Pulmonary:     Effort: Pulmonary effort is normal.     Breath sounds: Normal breath sounds.     Comments: Lungs clear to  auscultation Musculoskeletal:        General: Normal range of motion.     Cervical back: Normal range of motion and neck supple.  Skin:    General: Skin is warm and dry.  Neurological:     Mental Status: She is alert and oriented to person, place, and time.  Psychiatric:        Mood and Affect: Mood normal.        Behavior: Behavior normal.        Thought Content: Thought content normal.        Judgment: Judgment normal.     Diagnostics: FVC 2.35 which is 79% of predicted value, FEV1 1.75 which is 70% of predicted value.  Spirometry indicates normal ventilatory function.  Assessment and Plan: 1. Not well controlled moderate persistent asthma   2. Chronic urticaria   3. Seasonal and perennial allergic rhinitis   4. Anaphylactic shock due to food, subsequent encounter     Meds ordered this encounter  Medications   cetirizine (ZYRTEC ALLERGY) 10 MG tablet    Sig: Take 1 tablet (10 mg total) by mouth 2 (two) times daily as needed for allergies.    Dispense:  60 tablet    Refill:  2   famotidine (PEPCID) 20 MG tablet    Sig: Take 1 tablet (20 mg total) by mouth 2 (two) times daily as needed.    Dispense:  60 tablet    Refill:  2   montelukast (SINGULAIR) 10 MG tablet    Sig: Take 1 tablet (10 mg total) by mouth at bedtime.    Dispense:  30 tablet    Refill:  2   fluticasone furoate-vilanterol (BREO ELLIPTA) 200-25 MCG/ACT AEPB    Sig: Inhale 1 puff into the lungs daily.    Dispense:  1 each    Refill:  5    Patient Instructions  Asthma Given Breo 200-1 puff once a day to prevent cough or wheeze.  This will replace Breo 100 for now Continue montelukast 10 mg once a day to prevent cough or wheeze. Patient cautioned that rarely some children/adults can experience behavioral changes after beginning montelukast. These side effects are rare, however, if you notice any change, notify the clinic and discontinue montelukast.  Continue Xopenex 2 puffs once every 4-6 hours as needed  for cough or wheeze.  This will replace albuterol You may use Xopenex 2 puffs 5 to 15 minutes before activity to decrease cough or wheeze.  This will replace albuterol  Allergic rhinitis Continue allergen avoidance measures directed toward grass pollen, weed pollen, ragweed pollen, tree pollen, mold, dust mite, cat, dog, cockroach, and tobacco as listed below Continue cetirizine 10 mg once a day as needed for runny nose or itch Continue montelukast 10 mg once a day as listed above to help reduce allergy symptoms Continue Flonase  2 sprays in each nostril once a day as needed for stuffy nose Consider saline nasal rinses as needed for nasal symptoms. Use this before any medicated nasal sprays for best result Consider allergen immunotherapy if your symptoms are not well controlled with the treatment plan as listed below.  Written information provided at today's visit  Hives (urticaria) Use the fewest amount of medications Cetirizine (Zyrtec) 10mg  twice a day and famotidine (Pepcid) 20 mg twice a day. If no symptoms for 7-14 days then decrease to. Cetirizine (Zyrtec) 10mg  twice a day and famotidine (Pepcid) 20 mg once a day.  If no symptoms for 7-14 days then decrease to. Cetirizine (Zyrtec) 10mg  twice a day.  If no symptoms for 7-14 days then decrease to. Cetirizine (Zyrtec) 10mg  once a day.  May use Benadryl (diphenhydramine) as needed for breakthrough hives       If symptoms return, then step up dosage Keep a detailed symptom journal including foods eaten, contact with allergens, medications taken, weather changes.   Food allergy Continue to avoid shellfish. In case of an allergic reaction, take Benadryl 50 mg capsules every 6 hours, and if life-threatening symptoms occur, inject with EpiPen 0.3 mg. Complete the labs that were ordered at your last visit to this clinic to help [place] evaluate and treat your food allergy  Call the clinic if this treatment plan is not working well for you.  Follow  up in 3 months or sooner if needed.   Return in about 3 months (around 01/27/2023), or if symptoms worsen or fail to improve.    Thank you for the opportunity to care for this patient.  Please do not hesitate to contact me with questions.  Thermon Leyland, FNP [facility]

## 2022-10-28 ENCOUNTER — Ambulatory Visit: Payer: No Typology Code available for payment source | Admitting: Family Medicine

## 2022-11-01 ENCOUNTER — Ambulatory Visit: Payer: No Typology Code available for payment source | Admitting: Adult Health

## 2022-11-01 ENCOUNTER — Encounter: Payer: Self-pay | Admitting: Adult Health

## 2022-11-01 VITALS — BP 147/88 | HR 87 | Ht 62.0 in | Wt 324.0 lb

## 2022-11-01 DIAGNOSIS — N938 Other specified abnormal uterine and vaginal bleeding: Secondary | ICD-10-CM | POA: Diagnosis not present

## 2022-11-01 DIAGNOSIS — D219 Benign neoplasm of connective and other soft tissue, unspecified: Secondary | ICD-10-CM

## 2022-11-01 DIAGNOSIS — I10 Essential (primary) hypertension: Secondary | ICD-10-CM | POA: Insufficient documentation

## 2022-11-01 DIAGNOSIS — R4589 Other symptoms and signs involving emotional state: Secondary | ICD-10-CM

## 2022-11-01 DIAGNOSIS — R5383 Other fatigue: Secondary | ICD-10-CM | POA: Diagnosis not present

## 2022-11-01 NOTE — Progress Notes (Signed)
  Subjective:     Patient ID: Jessica Sanchez, female   DOB: 28-Sep-1985, 37 y.o.   MRN: [id number]  HPI Jessica Sanchez is a 37 year old black female,single, G1P0010, in complaining of having 3 periods per month for last 2 months, is taking myfembree, too.      Component Value Date/Time   DIAGPAP  07/10/2021 1024    - Negative for Intraepithelial Lesions or Malignancy (NILM)   DIAGPAP - Benign reactive/reparative changes 07/10/2021 1024   HPVHIGH Negative 07/10/2021 1024   ADEQPAP  07/10/2021 1024    Satisfactory for evaluation; transformation zone component PRESENT.    PCP is Jessica Laroche NP  Review of Systems period 3 x a month for last 2 months +tired +moody now too Reviewed past medical,surgical, social and family history. Reviewed medications and allergies.     Objective:   Physical Exam BP (!) 147/88 (BP Location: Right Arm, Patient Position: Sitting, Cuff Size: Normal)   Pulse 87   Ht 5\' 2"  (1.575 m)   Wt (!) 324 lb (147 kg)   BMI 59.26 kg/m     Skin warm and dry.  Lungs: clear to ausculation bilaterally. Cardiovascular: regular rate and rhythm.  Fall risk is moderate  Upstream - 11/01/22 1623       Pregnancy Intention Screening   Does the patient want to become pregnant in the next year? No    Does the patient's partner want to become pregnant in the next year? No    Would the patient like to discuss contraceptive options today? No      Contraception Wrap Up   Current Method Female Condom    End Method Female Condom            She declines exam Assessment:     1. DUB (dysfunctional uterine bleeding) Periods 3 x a month for last 2 months Will check labs Scheduled [place] at [facility] for 11/12/22 at 11:30 am to assess uterus and ovaries  Keep taking myfembree  - US PELVIC COMPLETE WITH TRANSVAGINAL; Future - CBC w/Diff - Iron, TIBC and Ferritin Panel - TSH + free T4  2. Fibroids Has known fibroids will get [place] to re assess, 11/12/22 at 11:30 am at [facility] - US PELVIC COMPLETE  WITH TRANSVAGINAL; Future  3. Hypertension, unspecified type Follow up with PCP about BP  4. Tired Will check labs  - CBC w/Diff - Iron, TIBC and Ferritin Panel - TSH + free T4  5. Moody - TSH + free T4     Plan:      Return 11/29/22 to see Dr Despina Hidden about possible surgery

## 2022-11-10 ENCOUNTER — Ambulatory Visit (HOSPITAL_COMMUNITY)
Admission: RE | Admit: 2022-11-10 | Discharge: 2022-11-10 | Disposition: A | Discharge status: Home / Self Care | Payer: No Typology Code available for payment source | Source: Ambulatory Visit | Attending: Adult Health | Admitting: Adult Health

## 2022-11-10 DIAGNOSIS — N938 Other specified abnormal uterine and vaginal bleeding: Secondary | ICD-10-CM | POA: Diagnosis not present

## 2022-11-10 DIAGNOSIS — D219 Benign neoplasm of connective and other soft tissue, unspecified: Secondary | ICD-10-CM | POA: Diagnosis not present

## 2022-11-10 DIAGNOSIS — N852 Hypertrophy of uterus: Secondary | ICD-10-CM | POA: Diagnosis not present

## 2022-11-11 ENCOUNTER — Ambulatory Visit (HOSPITAL_COMMUNITY): Admission: RE | Admit: 2022-11-11 | Payer: No Typology Code available for payment source | Source: Ambulatory Visit

## 2022-11-29 ENCOUNTER — Encounter: Payer: Self-pay | Admitting: Obstetrics & Gynecology

## 2022-11-29 ENCOUNTER — Ambulatory Visit: Payer: No Typology Code available for payment source | Admitting: Obstetrics & Gynecology

## 2022-11-29 VITALS — BP 138/82 | HR 85

## 2022-11-29 DIAGNOSIS — N921 Excessive and frequent menstruation with irregular cycle: Secondary | ICD-10-CM

## 2022-11-29 DIAGNOSIS — N946 Dysmenorrhea, unspecified: Secondary | ICD-10-CM | POA: Diagnosis not present

## 2022-11-29 DIAGNOSIS — N938 Other specified abnormal uterine and vaginal bleeding: Secondary | ICD-10-CM

## 2022-11-29 LAB — POCT HEMOGLOBIN: Hemoglobin: 11.4 g/dL (ref 11–14.6)

## 2022-11-29 NOTE — Progress Notes (Signed)
Follow up appointment for response: myfembree  Chief Complaint  Patient presents with   Follow-up    Discuss surgical options    Blood pressure 138/82, pulse 85, last menstrual period 11/21/2022.  Pt has had an ablation 2 IUDs at once High dose megestrol  Now myfembree and I cannot stop her bleeding She is at super high lifetime risk for endometrial abnormalities and cancer So I have recommend robot assisted TLH + BS  MEDS ordered this encounter: No orders of the defined types were placed in this encounter.   Orders for this encounter: Orders Placed This Encounter  Procedures   POCT hemoglobin    Impression + Management Plan   ICD-10-CM   1. Menometrorrhagia  N92.1     2. Dysmenorrhea  N94.6     3. DUB (dysfunctional uterine bleeding)  N93.8 POCT hemoglobin     Proceed with RA TLH + BS(preserve ovaries)  Follow Up: Return in about 2 months (around 01/28/2023) for Post Op, with Dr Despina Hidden.     All questions were answered.  Past Medical History:  Diagnosis Date   Angio-edema    Arthritis    right shoulder   Asthma    Dyspnea    Dysrhythmia    hx of SVT   Fibroid (bleeding) (uterine)    Fibroid, uterine    H/O metrorrhagia    Hypertension    IUD (intrauterine device) in place 07/11/2015   Menorrhagia    Obesity, Class III, BMI 40-49.9 (morbid obesity) (HCC) 11/11/2018   Sleep apnea    uses CPAP - setting 11   SVT (supraventricular tachycardia)     Past Surgical History:  Procedure Laterality Date   DILATATION AND CURETTAGE/HYSTEROSCOPY WITH MINERVA N/A 12/01/2021   Procedure: DILATATION AND CURETTAGE/HYSTEROSCOPY WITH MINERVA;  Surgeon: Lazaro Arms, MD;  Location: [facility];  Service: Gynecology;  Laterality: N/A;   HYSTEROSCOPY N/A 09/17/2020   Procedure: HYSTEROSCOPY;  Surgeon: Lazaro Arms, MD;  Location: [facility];  Service: Gynecology;  Laterality: N/A;   HYSTEROSCOPY WITH D & C N/A 09/27/2013   Procedure: DILATATION AND CURETTAGE /HYSTEROSCOPY  and insertion of Mirena IUD ;  Surgeon: Freddrick March. Tenny Craw, MD;  Location: [facility];  Service: Gynecology;  Laterality: N/A;   IUD REMOVAL N/A 09/17/2020   Procedure: INTRAUTERINE DEVICE (IUD) REMOVAL (2);  Surgeon: Lazaro Arms, MD;  Location: [facility];  Service: Gynecology;  Laterality: N/A;   MYOMECTOMY  02/03/2011   Procedure: MYOMECTOMY;  Surgeon: Lazaro Arms, MD;  Location: [facility];  Service: Gynecology;  Laterality: N/A;   SVT ABLATION N/A 11/08/2018   Procedure: SVT ABLATION;  Surgeon: Marinus Maw, MD;  Location: [facility] INVASIVE CV LAB;  Service: Cardiovascular;  Laterality: N/A;   SVT ABLATION N/A 12/26/2020   Procedure: SVT ABLATION;  Surgeon: Marinus Maw, MD;  Location: [facility] INVASIVE CV LAB;  Service: Cardiovascular;  Laterality: N/A;   TONSILLECTOMY     TYMPANOSTOMY TUBE PLACEMENT      OB History     Gravida  1   Para      Term      Preterm      AB  1   Living         SAB  1   IAB      Ectopic      Multiple      Live Births              Allergies  Allergen Reactions  Shellfish Allergy Shortness Of Breath, Itching and Swelling    Mouth became swollen, throat started itching, and developed shortness of breath   Sulfa Antibiotics Itching, Shortness Of Breath and Swelling    Mouth became swollen, throat started itching, and developed shortness of breath   Adhesive [Tape] Itching    Depends on the type of medical tape    Social History   Socioeconomic History   Marital status: Single    Spouse name: Not on file   Number of children: Not on file   Years of education: Not on file   Highest education level: Not on file  Occupational History   Not on file  Tobacco Use   Smoking status: Never   Smokeless tobacco: Never   Tobacco comments:    socially  Vaping Use   Vaping status: Never Used  Substance and Sexual Activity   Alcohol use: Yes    Comment: occasional   Drug use: Yes    Types: Marijuana    Comment: on occ   Sexual activity: Yes     Birth control/protection: Condom  Other Topics Concern   Not on file  Social History Narrative   Not on file   Social Determinants of Health   Financial Resource Strain: Not on file  Food Insecurity: Not on file  Transportation Needs: Not on file  Physical Activity: Not on file  Stress: Not on file  Social Connections: Not on file    Family History  Problem Relation Age of Onset   Cirrhosis Mother    Diabetes Mother    Cancer Maternal Grandfather    Hypertension Sister    Diabetes Sister    Anesthesia problems Neg Hx    Hypotension Neg Hx    Malignant hyperthermia Neg Hx    Pseudochol deficiency Neg Hx

## 2022-11-30 ENCOUNTER — Encounter: Payer: Self-pay | Admitting: Obstetrics & Gynecology

## 2022-12-20 ENCOUNTER — Encounter: Payer: Self-pay | Admitting: Internal Medicine

## 2022-12-20 ENCOUNTER — Ambulatory Visit (INDEPENDENT_AMBULATORY_CARE_PROVIDER_SITE_OTHER): Payer: No Typology Code available for payment source | Admitting: Internal Medicine

## 2022-12-20 VITALS — BP 144/86 | HR 79 | Ht 62.0 in | Wt 353.6 lb

## 2022-12-20 DIAGNOSIS — G43E19 Chronic migraine with aura, intractable, without status migrainosus: Secondary | ICD-10-CM | POA: Diagnosis not present

## 2022-12-20 DIAGNOSIS — D500 Iron deficiency anemia secondary to blood loss (chronic): Secondary | ICD-10-CM

## 2022-12-20 DIAGNOSIS — I10 Essential (primary) hypertension: Secondary | ICD-10-CM | POA: Diagnosis not present

## 2022-12-20 MED ORDER — SUMATRIPTAN SUCCINATE 100 MG PO TABS: 100.0000 mg | ORAL_TABLET | ORAL | 2 refills | Status: DC | PRN

## 2022-12-20 MED ORDER — AMLODIPINE BESYLATE 5 MG PO TABS: 5.0000 mg | ORAL_TABLET | Freq: Every day | ORAL | 3 refills | Status: DC

## 2022-12-20 NOTE — Progress Notes (Addendum)
Acute Office Visit  Subjective:    Patient ID: Jessica Sanchez, female    DOB: 03/16/1986, 37 y.o.   MRN: [id number]  Chief Complaint  Patient presents with   Dizziness    Patient having dizzy spells with headaches    HPI Patient is in today for  episodes of headache, which are recurrent, daily, on the left side, lasting for 8-10 hours and is associated with nausea, photophobia and phonophobia.  She has to sleep in the dark room to get some relief.  Denies any visual disturbance, but does report intermittent dizziness, which is chronic.  Her BP was elevated today.  She also reports elevated BP readings at home and at grocery stores.  Denies any chest pain or palpitations currently.  Of note, she uses CPAP regularly for OSA.  Past Medical History:  Diagnosis Date   Angio-edema    Arthritis    right shoulder   Asthma    Dyspnea    Dysrhythmia    hx of SVT   Fibroid (bleeding) (uterine)    Fibroid, uterine    H/O metrorrhagia    Hypertension    IUD (intrauterine device) in place 07/11/2015   Menorrhagia    Obesity, Class III, BMI 40-49.9 (morbid obesity) (HCC) 11/11/2018   Sleep apnea    uses CPAP - setting 11   SVT (supraventricular tachycardia)     Past Surgical History:  Procedure Laterality Date   DILATATION AND CURETTAGE/HYSTEROSCOPY WITH MINERVA N/A 12/01/2021   Procedure: DILATATION AND CURETTAGE/HYSTEROSCOPY WITH MINERVA;  Surgeon: Lazaro Arms, MD;  Location: [facility];  Service: Gynecology;  Laterality: N/A;   HYSTEROSCOPY N/A 09/17/2020   Procedure: HYSTEROSCOPY;  Surgeon: Lazaro Arms, MD;  Location: [facility];  Service: Gynecology;  Laterality: N/A;   HYSTEROSCOPY WITH D & C N/A 09/27/2013   Procedure: DILATATION AND CURETTAGE /HYSTEROSCOPY and insertion of Mirena IUD ;  Surgeon: Freddrick March. Tenny Craw, MD;  Location: [facility];  Service: Gynecology;  Laterality: N/A;   IUD REMOVAL N/A 09/17/2020   Procedure: INTRAUTERINE DEVICE (IUD) REMOVAL (2);  Surgeon: Lazaro Arms,  MD;  Location: [facility];  Service: Gynecology;  Laterality: N/A;   MYOMECTOMY  02/03/2011   Procedure: MYOMECTOMY;  Surgeon: Lazaro Arms, MD;  Location: [facility];  Service: Gynecology;  Laterality: N/A;   SVT ABLATION N/A 11/08/2018   Procedure: SVT ABLATION;  Surgeon: Marinus Maw, MD;  Location: [facility] INV[facility] CV LAB;  Service: Cardiovascular;  Laterality: N/A;   SVT ABLATION N/A 12/26/2020   Procedure: SVT ABLATION;  Surgeon: Marinus Maw, MD;  Location: [facility] INVASIVE CV LAB;  Service: Cardiovascular;  Laterality: N/A;   TONSILLECTOMY     TYMPANOSTOMY TUBE PLACEMENT      Family History  Problem Relation Age of Onset   Cirrhosis Mother    Diabetes Mother    Cancer Maternal Grandfather    Hypertension Sister    Diabetes Sister    Anesthesia problems Neg Hx    Hypotension Neg Hx    Malignant hyperthermia Neg Hx    Pseudochol deficiency Neg Hx     Social History   Socioeconomic History   Marital status: Single    Spouse name: Not on file   Number of children: Not on file   Years of education: Not on file   Highest education level: Not on file  Occupational History   Not on file  Tobacco Use   Smoking status: Never   Smokeless tobacco: Never  Tobacco comments:    socially  Vaping Use   Vaping status: Never Used  Substance and Sexual Activity   Alcohol use: Yes    Comment: occasional   Drug use: Yes    Types: Marijuana    Comment: on occ   Sexual activity: Yes    Birth control/protection: Condom  Other Topics Concern   Not on file  Social History Narrative   Not on file   Social Determinants of Health   Financial Resource Strain: Not on file  Food Insecurity: Not on file  Transportation Needs: Not on file  Physical Activity: Not on file  Stress: Not on file  Social Connections: Not on file  Intimate Partner Violence: Not on file    Outpatient Medications Prior to Visit  Medication Sig Dispense Refill   albuterol (VENTOLIN HFA) 108 (90 Base) MCG/ACT  inhaler Inhale 2 puffs into the lungs every 6 (six) hours as needed for wheezing or shortness of breath. 8 g 0   famotidine (PEPCID) 20 MG tablet Take 1 tablet (20 mg total) by mouth 2 (two) times daily as needed. 60 tablet 2   fluticasone furoate-vilanterol (BREO ELLIPTA) 200-25 MCG/ACT AEPB Inhale 1 puff into the lungs daily. 1 each 5   gabapentin (NEURONTIN) 100 MG capsule Take 1 capsule (100 mg total) by mouth at bedtime. 90 capsule 1   levocetirizine (XYZAL) 5 MG tablet Take 1 tablet (5 mg total) by mouth in the morning and at bedtime. 60 tablet 0   montelukast (SINGULAIR) 10 MG tablet Take 1 tablet (10 mg total) by mouth at bedtime. 30 tablet 2   Multiple Vitamins-Calcium (ONE-A-DAY WOMENS PO) Take 1 tablet by mouth daily.     Relugolix-Estradiol-Norethind (MYFEMBREE) 40-1-0.5 MG TABS Take 1 tablet by mouth daily. 30 tablet 11   Vitamin D, Ergocalciferol, (DRISDOL) 1.25 MG (50000 UNIT) CAPS capsule Take 1 capsule (50,000 Units total) by mouth every 7 (seven) days. 20 capsule 2   No facility-administered medications prior to visit.    Allergies  Allergen Reactions   Shellfish Allergy Shortness Of Breath, Itching and Swelling    Mouth became swollen, throat started itching, and developed shortness of breath   Sulfa Antibiotics Itching, Shortness Of Breath and Swelling    Mouth became swollen, throat started itching, and developed shortness of breath   Adhesive [Tape] Itching    Depends on the type of medical tape    Review of Systems  Constitutional:  Positive for fatigue. Negative for chills and fever.  HENT:  Negative for congestion, sinus pressure, sinus pain and sore throat.   Eyes:  Negative for pain and discharge.  Respiratory:  Negative for cough and shortness of breath.   Cardiovascular:  Negative for chest pain and palpitations.  Gastrointestinal:  Negative for diarrhea, nausea and vomiting.  Endocrine: Negative for polydipsia and polyuria.  Genitourinary:  Negative for  dysuria and hematuria.  Musculoskeletal:  Negative for neck pain and neck stiffness.  Skin:  Negative for rash.  Neurological:  Positive for dizziness and headaches. Negative for weakness.  Psychiatric/Behavioral:  Negative for agitation and behavioral problems.        Objective:    Physical Exam Vitals reviewed.  Constitutional:      General: She is not in acute distress.    Appearance: She is obese. She is not diaphoretic.  HENT:     Head: Normocephalic and atraumatic.     Mouth/Throat:     Mouth: Mucous membranes are moist.  Pharynx: No posterior oropharyngeal erythema.  Eyes:     General: No scleral icterus.    Extraocular Movements: Extraocular movements intact.  Cardiovascular:     Rate and Rhythm: Normal rate and regular rhythm.     Heart sounds: Normal heart sounds. No murmur heard. Pulmonary:     Breath sounds: Normal breath sounds. No wheezing or rales.  Musculoskeletal:     Cervical back: Neck supple. No tenderness.     Right lower leg: No edema.     Left lower leg: No edema.  Skin:    General: Skin is warm.     Findings: No rash.  Neurological:     General: No focal deficit present.     Mental Status: She is alert and oriented to person, place, and time.     Cranial Nerves: No cranial nerve deficit.     Sensory: No sensory deficit.  Psychiatric:        Mood and Affect: Mood normal.        Behavior: Behavior normal.     BP (!) 144/86 (BP Location: Right Arm)   Pulse 79   Ht 5\' 2"  (1.575 m)   Wt (!) 353 lb 9.6 oz (160.4 kg)   LMP 11/21/2022   SpO2 95%   BMI 64.67 kg/m  Wt Readings from Last 3 Encounters:  12/20/22 (!) 353 lb 9.6 oz (160.4 kg)  11/01/22 (!) 324 lb (147 kg)  10/27/22 (!) 342 lb 2 oz (155.2 kg)        Assessment & Plan:   Problem List Items Addressed This Visit       Cardiovascular and Mediastinum   Essential hypertension    BP Readings from Last 1 Encounters:  12/20/22 (!) 144/86   Uncontrolled with diet alone Added  Amlodipine 5 mg QD Counseled for compliance with the medications Advised DASH diet and moderate exercise/walking, at least 150 mins/week       Relevant Medications   amLODipine (NORVASC) 5 MG tablet   Intractable chronic migraine with aura and without status migrainosus - Primary    Her episodes of headache are likely due to migraine in addition to uncontrolled HTN Sumatriptan as needed for migraine If recurrent use needed, may need migraine PPx      Relevant Medications   SUMAtriptan (IMITREX) 100 MG tablet   amLODipine (NORVASC) 5 MG tablet     Other   Iron deficiency anemia due to chronic blood loss    Has menorrhagia-iron deficiency anemia can also contribute to fatigue and dizziness Advised to start iron supplement Planned to get hysterectomy        Meds ordered this encounter  Medications   SUMAtriptan (IMITREX) 100 MG tablet    Sig: Take 1 tablet (100 mg total) by mouth every 2 (two) hours as needed for migraine. May repeat in 2 hours if headache persists or recurs.    Dispense:  10 tablet    Refill:  2   amLODipine (NORVASC) 5 MG tablet    Sig: Take 1 tablet (5 mg total) by mouth daily.    Dispense:  30 tablet    Refill:  3     Taiz Bickle Concha Se, MD

## 2022-12-20 NOTE — Assessment & Plan Note (Addendum)
BP Readings from Last 1 Encounters:  12/20/22 (!) 144/86   Uncontrolled with diet alone Added Amlodipine 5 mg QD Counseled for compliance with the medications Advised DASH diet and moderate exercise/walking, at least 150 mins/week

## 2022-12-20 NOTE — Assessment & Plan Note (Signed)
Has menorrhagia-iron deficiency anemia can also contribute to fatigue and dizziness Advised to start iron supplement Planned to get hysterectomy

## 2022-12-20 NOTE — Patient Instructions (Signed)
Please start taking Amlodipine once daily for blood pressure.  Please take Sumatriptan as prescribed as needed for headache. Maximum 2 tablets in a day, 2 hours apart.  Avoid loud noise and direct sunlight exposure.

## 2022-12-20 NOTE — Assessment & Plan Note (Signed)
Her episodes of headache are likely due to migraine in addition to uncontrolled HTN Sumatriptan as needed for migraine If recurrent use needed, may need migraine PPx

## 2022-12-30 ENCOUNTER — Emergency Department (HOSPITAL_COMMUNITY)
Admission: EM | Admit: 2022-12-30 | Discharge: 2022-12-30 | Discharge status: Against Medical Advice | Payer: No Typology Code available for payment source | Attending: Emergency Medicine | Admitting: Emergency Medicine

## 2022-12-30 ENCOUNTER — Emergency Department (HOSPITAL_COMMUNITY): Payer: No Typology Code available for payment source

## 2022-12-30 ENCOUNTER — Other Ambulatory Visit: Payer: Self-pay

## 2022-12-30 DIAGNOSIS — R0989 Other specified symptoms and signs involving the circulatory and respiratory systems: Secondary | ICD-10-CM | POA: Diagnosis not present

## 2022-12-30 DIAGNOSIS — R079 Chest pain, unspecified: Secondary | ICD-10-CM | POA: Diagnosis not present

## 2022-12-30 DIAGNOSIS — Z5321 Procedure and treatment not carried out due to patient leaving prior to being seen by health care provider: Secondary | ICD-10-CM | POA: Diagnosis not present

## 2022-12-30 DIAGNOSIS — R42 Dizziness and giddiness: Secondary | ICD-10-CM | POA: Diagnosis not present

## 2022-12-30 LAB — CBC
HCT: 37.7 % (ref 36.0–46.0)
Hemoglobin: 11.5 g/dL — ABNORMAL LOW (ref 12.0–15.0)
MCH: 26.3 pg (ref 26.0–34.0)
MCHC: 30.5 g/dL (ref 30.0–36.0)
MCV: 86.3 fL (ref 80.0–100.0)
Platelets: 293 10*3/uL (ref 150–400)
RBC: 4.37 MIL/uL (ref 3.87–5.11)
RDW: 15.5 % (ref 11.5–15.5)
WBC: 5.5 10*3/uL (ref 4.0–10.5)
nRBC: 0 % (ref 0.0–0.2)

## 2022-12-30 LAB — BASIC METABOLIC PANEL
Anion gap: 9 (ref 5–15)
BUN: 13 mg/dL (ref 6–20)
CO2: 25 mmol/L (ref 22–32)
Calcium: 8.8 mg/dL — ABNORMAL LOW (ref 8.9–10.3)
Chloride: 101 mmol/L (ref 98–111)
Creatinine, Ser: 0.55 mg/dL (ref 0.44–1.00)
GFR, Estimated: >60 mL/min (ref >60)
Glucose, Bld: 76 mg/dL (ref 70–99)
Potassium: 4.1 mmol/L (ref 3.5–5.1)
Sodium: 135 mmol/L (ref 135–145)

## 2022-12-30 LAB — TROPONIN I (HIGH SENSITIVITY)
Troponin I (High Sensitivity): <2 ng/L (ref <18)
Troponin I (High Sensitivity): <2 ng/L (ref <18)

## 2022-12-30 NOTE — ED Triage Notes (Signed)
Pt presents with CP and dizziness that started around 2 hours ago.

## 2022-12-30 NOTE — ED Notes (Signed)
Came to speak to pt regarding being seen in a hall bed. Unable to locate pt and sister at this time

## 2022-12-31 ENCOUNTER — Other Ambulatory Visit: Payer: Self-pay

## 2022-12-31 ENCOUNTER — Ambulatory Visit
Admission: EM | Admit: 2022-12-31 | Discharge: 2022-12-31 | Disposition: A | Discharge status: Home / Self Care | Payer: No Typology Code available for payment source | Attending: Nurse Practitioner | Admitting: Nurse Practitioner

## 2022-12-31 DIAGNOSIS — R42 Dizziness and giddiness: Secondary | ICD-10-CM | POA: Diagnosis not present

## 2022-12-31 DIAGNOSIS — R079 Chest pain, unspecified: Secondary | ICD-10-CM

## 2022-12-31 MED ORDER — LIDOCAINE VISCOUS HCL 2 % MT SOLN
15.0000 mL | Freq: Once | OROMUCOSAL | Status: AC
  Administered 2022-12-31: 15 mL via OROMUCOSAL

## 2022-12-31 MED ORDER — ALUM & MAG HYDROXIDE-SIMETH 200-200-20 MG/5ML PO SUSP
30.0000 mL | Freq: Once | ORAL | Status: AC
  Administered 2022-12-31: 30 mL via ORAL

## 2022-12-31 NOTE — ED Triage Notes (Signed)
Pt states dizziness and CP since yesterday.  States she went to the ER but left after waiting to be seen.  States they did blood work and and EKG.

## 2022-12-31 NOTE — Discharge Instructions (Addendum)
The lab work performed at the emergency department was normal along with your EKGs performed on 8/29 and today. Continue your current medications. If you experience worsening dizziness, chest pain and shortness of breath, with difficulty breathing, nausea, vomiting, please go to the emergency department immediately for further evaluation. As discussed, please follow-up with your cardiologist for reevaluation within the next 5 to 7 days. Follow-up as needed.

## 2022-12-31 NOTE — ED Provider Notes (Addendum)
[facility]    CSN: [id number] Arrival date & time: 12/31/22  1128      History   Chief Complaint Chief Complaint  Patient presents with   Dizziness    HPI Jessica Sanchez is a 37 y.o. female.   The history is provided by the patient.   Patient presents for 1 to 2-day history of chest pain and dizziness.  Patient states that the chest pain is in the middle of her chest.  She describes the pain as "pressure", but states pain does not radiate.  She states that the dizziness comes and goes.  She states that she found relief after going to sleep with her dizziness.  She states that the pain is similar to episodes that she is experienced in the past.  Reports that she has seen cardiology, and she is currently on medications to "control my heart rate".  Patient states that she did notice that her heart rate was in the 130s and 140s last evening.  She states that she does have some shortness of breath.  She also states that she has lower extremity edema that is been present for the past 1 to 2 years,, and has been on "fluid pills" in the past.  She states she has been off of the medication for at least 2 months.  She states that her doctor has told her to keep her legs elevated.  She denies fever, chills, headache, ear pain, visual changes, lightheadedness, abdominal pain, nausea, vomiting, history of DVT/PE, recent surgery/recent travel, use of birth control, or smoking.  Past medical history significant for SVT, elevated D-dimer, OSA, peripheral edema, obesity, asthma. Past Medical History:  Diagnosis Date   Angio-edema    Arthritis    right shoulder   Asthma    Dyspnea    Dysrhythmia    hx of SVT   Fibroid (bleeding) (uterine)    Fibroid, uterine    H/O metrorrhagia    Hypertension    IUD (intrauterine device) in place 07/11/2015   Menorrhagia    Obesity, Class III, BMI 40-49.9 (morbid obesity) (HCC) 11/11/2018   Sleep apnea    uses CPAP - setting 11   SVT  (supraventricular tachycardia)     Patient Active Problem List   Diagnosis Date Noted   Intractable chronic migraine with aura and without status migrainosus 12/20/2022   Iron deficiency anemia due to chronic blood loss 12/20/2022   Essential hypertension 11/01/2022   DUB (dysfunctional uterine bleeding) 11/01/2022   Not well controlled moderate persistent asthma 10/27/2022   Peripheral neuropathy 09/28/2022   Headache 07/01/2022   Screening for STD (sexually transmitted disease) 03/30/2022   Abdominal pain 03/02/2022   RUQ pain 02/23/2022   Abnormal abdominal CT scan 02/23/2022   Constipation 02/11/2022   Mild persistent asthma without complication 02/03/2022   Chronic urticaria 02/03/2022   Seasonal and perennial allergic rhinitis 02/03/2022   Anaphylactic shock due to adverse food reaction 02/03/2022   Need for immunization against influenza 01/27/2022   OSA (obstructive sleep apnea) 11/23/2021   Otitis media of left ear 10/26/2021   Chronic venous insufficiency of lower extremity 10/26/2021   Allergy 10/26/2021   Encounter for IUD removal    Menorrhagia 09/04/2020   Elevated troponin    Attempted IUD removal, unsuccessful 07/05/2019   Encounter for IUD insertion 07/05/2019   Gastroesophageal reflux disease    Nonspecific chest pain 11/11/2018   Morbid obesity with body mass index (BMI) of 60.0 to 69.9 in adult (  HCC) 11/11/2018   Elevated d-dimer 11/11/2018   SVT (supraventricular tachycardia) 10/04/2018   IUD (intrauterine device) in place 07/11/2015   Other disorder of menstruation and other abnormal bleeding from female genital tract 10/30/2012    Past Surgical History:  Procedure Laterality Date   DILATATION AND CURETTAGE/HYSTEROSCOPY WITH MINERVA N/A 12/01/2021   Procedure: DILATATION AND CURETTAGE/HYSTEROSCOPY WITH MINERVA;  Surgeon: Lazaro Arms, MD;  Location: [facility];  Service: Gynecology;  Laterality: N/A;   HYSTEROSCOPY N/A 09/17/2020   Procedure:  HYSTEROSCOPY;  Surgeon: Lazaro Arms, MD;  Location: [facility];  Service: Gynecology;  Laterality: N/A;   HYSTEROSCOPY WITH D & C N/A 09/27/2013   Procedure: DILATATION AND CURETTAGE /HYSTEROSCOPY and insertion of Mirena IUD ;  Surgeon: Freddrick March. Tenny Craw, MD;  Location: [facility];  Service: Gynecology;  Laterality: N/A;   IUD REMOVAL N/A 09/17/2020   Procedure: INTRAUTERINE DEVICE (IUD) REMOVAL (2);  Surgeon: Lazaro Arms, MD;  Location: [facility];  Service: Gynecology;  Laterality: N/A;   MYOMECTOMY  02/03/2011   Procedure: MYOMECTOMY;  Surgeon: Lazaro Arms, MD;  Location: [facility];  Service: Gynecology;  Laterality: N/A;   SVT ABLATION N/A 11/08/2018   Procedure: SVT ABLATION;  Surgeon: Marinus Maw, MD;  Location: [facility] INVASIVE CV LAB;  Service: Cardiovascular;  Laterality: N/A;   SVT ABLATION N/A 12/26/2020   Procedure: SVT ABLATION;  Surgeon: Marinus Maw, MD;  Location: [facility];  Service: Cardiovascular;  Laterality: N/A;   TONSILLECTOMY     TYMPANOSTOMY TUBE PLACEMENT      OB History     Gravida  1   Para      Term      Preterm      AB  1   Living         SAB  1   IAB      Ectopic      Multiple      Live Births               Home Medications    Prior to Admission medications   Medication Sig Start Date End Date Taking? Authorizing Provider  albuterol (VENTOLIN HFA) 108 (90 Base) MCG/ACT inhaler Inhale 2 puffs into the lungs every 6 (six) hours as needed for wheezing or shortness of breath. 10/26/21   Gilmore Laroche, FNP  amLODipine (NORVASC) 5 MG tablet Take 1 tablet (5 mg total) by mouth daily. 12/20/22   Anabel Halon, MD  famotidine (PEPCID) 20 MG tablet Take 1 tablet (20 mg total) by mouth 2 (two) times daily as needed. 10/27/22   Hetty Blend, FNP  fluticasone furoate-vilanterol (BREO ELLIPTA) 200-25 MCG/ACT AEPB Inhale 1 puff into the lungs daily. 10/27/22   Hetty Blend, FNP  gabapentin (NEURONTIN) 100 MG capsule Take 1 capsule (100 mg total)  by mouth at bedtime. 09/28/22   Gilmore Laroche, FNP  levocetirizine (XYZAL) 5 MG tablet Take 1 tablet (5 mg total) by mouth in the morning and at bedtime. 02/03/22   Ambs, Norvel Richards, FNP  montelukast (SINGULAIR) 10 MG tablet Take 1 tablet (10 mg total) by mouth at bedtime. 10/27/22   Hetty Blend, FNP  Multiple Vitamins-Calcium (ONE-A-DAY WOMENS PO) Take 1 tablet by mouth daily.    [provider]  Relugolix-Estradiol-Norethind (MYFEMBREE) 40-1-0.5 MG TABS Take 1 tablet by mouth daily. 06/02/22   Lazaro Arms, MD  SUMAtriptan (IMITREX) 100 MG tablet Take 1 tablet (100 mg total) by  mouth every 2 (two) hours as needed for migraine. May repeat in 2 hours if headache persists or recurs. 12/20/22   Anabel Halon, MD  Vitamin D, Ergocalciferol, (DRISDOL) 1.25 MG (50000 UNIT) CAPS capsule Take 1 capsule (50,000 Units total) by mouth every 7 (seven) days. 09/28/22   Gilmore Laroche, FNP  apixaban (ELIQUIS) 5 MG TABS tablet Take 2 tablets (10mg ) twice daily for 7 days, then 1 tablet (5mg ) twice daily 04/20/19 04/20/19  Michela Pitcher A, PA-C  levonorgestrel (MIRENA) 20 MCG/24HR IUD 1 each by Intrauterine route once.   06/25/20  [provider]    Family History Family History  Problem Relation Age of Onset   Cirrhosis Mother    Diabetes Mother    Cancer Maternal Grandfather    Hypertension Sister    Diabetes Sister    Anesthesia problems Neg Hx    Hypotension Neg Hx    Malignant hyperthermia Neg Hx    Pseudochol deficiency Neg Hx     Social History Social History   Tobacco Use   Smoking status: Never   Smokeless tobacco: Never   Tobacco comments:    socially  Vaping Use   Vaping status: Never Used  Substance Use Topics   Alcohol use: Yes    Comment: occasional   Drug use: Yes    Types: Marijuana    Comment: on occ     Allergies   Shellfish allergy, Sulfa antibiotics, and Adhesive [tape]   Review of Systems Review of Systems Per HPI  Physical Exam Triage Vital  Signs ED Triage Vitals  Encounter Vitals Group     BP 12/31/22 1134 (!) 138/96     Systolic BP Percentile --      Diastolic BP Percentile --      Pulse Rate 12/31/22 1134 78     Resp 12/31/22 1134 16     Temp 12/31/22 1134 97.7 F (36.5 C)     Temp Source 12/31/22 1134 Oral     SpO2 12/31/22 1134 97 %     Weight --      Height --      Head Circumference --      Peak Flow --      Pain Score 12/31/22 1135 9     Pain Loc --      Pain Education --      Exclude from Growth Chart --    Orthostatic VS for the past 24 hrs:  BP- Lying Pulse- Lying BP- Sitting Pulse- Sitting BP- Standing at 0 minutes Pulse- Standing at 0 minutes  12/31/22 1148 114/75 78 112/83 80 116/78 89    Updated Vital Signs BP (!) 138/96 (BP Location: Right Arm)   Pulse 78   Temp 97.7 F (36.5 C) (Oral)   Resp 16   LMP 12/26/2022 (Approximate)   SpO2 97%   Visual Acuity Right Eye Distance:   Left Eye Distance:   Bilateral Distance:    Right Eye Near:   Left Eye Near:    Bilateral Near:     Physical Exam Vitals and nursing note reviewed.  Constitutional:      General: She is not in acute distress.    Appearance: Normal appearance.  HENT:     Head: Normocephalic.     Right Ear: Tympanic membrane, ear canal and external ear normal.     Left Ear: Tympanic membrane, ear canal and external ear normal.     Nose: Nose normal.     Mouth/Throat:  Mouth: Mucous membranes are moist.  Eyes:     Extraocular Movements: Extraocular movements intact.     Conjunctiva/sclera: Conjunctivae normal.     Pupils: Pupils are equal, round, and reactive to light.  Cardiovascular:     Rate and Rhythm: Normal rate and regular rhythm.     Pulses: Normal pulses.     Heart sounds: Normal heart sounds.  Pulmonary:     Effort: Pulmonary effort is normal. No respiratory distress.     Breath sounds: Normal breath sounds. No stridor. No wheezing, rhonchi or rales.  Abdominal:     General: Bowel sounds are normal.      Palpations: Abdomen is soft.     Tenderness: There is no abdominal tenderness.     Comments: No change in symptoms after administration of GI cocktail.  Musculoskeletal:     Cervical back: Normal range of motion.     Right lower leg: Edema present.     Left lower leg: Edema present.  Lymphadenopathy:     Cervical: No cervical adenopathy.  Skin:    General: Skin is warm and dry.  Neurological:     General: No focal deficit present.     Mental Status: She is alert and oriented to person, place, and time.     GCS: GCS eye subscore is 4. GCS verbal subscore is 5. GCS motor subscore is 6.     Cranial Nerves: Cranial nerves 2-12 are intact.     Sensory: Sensation is intact.     Motor: Motor function is intact.     Coordination: Coordination is intact.     Gait: Gait is intact.  Psychiatric:        Mood and Affect: Mood normal.        Behavior: Behavior normal.      UC Treatments / Results  Labs (all labs ordered are listed, but only abnormal results are displayed) Labs Reviewed - No data to display  EKG: NSR without ectopy, no STEMI.  Reviewed EKG comparisons on 12/30/2022 and 07/26/2022   Radiology   Procedures Procedures (including critical care time)  Medications Ordered in UC Medications  alum & mag hydroxide-simeth (MAALOX/MYLANTA) 200-200-20 MG/5ML suspension 30 mL (30 mLs Oral Given 12/31/22 1219)  lidocaine (XYLOCAINE) 2 % viscous mouth solution 15 mL (15 mLs Mouth/Throat Given 12/31/22 1219)    Initial Impression / Assessment and Plan / UC Course  I have reviewed the triage vital signs and the nursing notes.  Pertinent labs & imaging results that were available during my care of the patient were reviewed by me and considered in my medical decision making (see chart for details).  The patient is well-appearing, she is in no acute distress, vital signs are stable.  Reviewed lab work, chest x-ray and EKG performed in the emergency department last evening, which was  normal.  Repeated EKG today, which shows normal sinus rhythm, no ectopy, no STEMI.  Orthostatic vital signs are also negative.  Neurological exam is without deficits or abnormalities.  Reviewed patient's chart and shows that patient has been seen for the same or similar chest pain symptoms in the past.  GI cocktail administered as patient does have a history of reflux, she denies any relief of symptoms after the GI cocktail. Would like for the patient to follow-up with cardiology within the next 5 to 7 days for reevaluation for episodes of tachycardia.  Supportive care recommendations were provided and discussed with the patient to include increasing fluids, allowing for plenty  of rest, along with monitoring symptoms for worsening.  Advised patient to go to the emergency department immediately for worsening chest pain, shortness of breath, difficulty breathing, with diaphoresis, sweating, or other concerns.  Final Clinical Impressions(s) / UC Diagnoses   Final diagnoses:  Chest pain, unspecified type  Dizziness     Discharge Instructions      The lab work performed at the emergency department was normal along with your EKGs performed on 8/29 and today. Continue your current medications. If you experience worsening dizziness, chest pain and shortness of breath, with difficulty breathing, nausea, vomiting, please go to the emergency department immediately for further evaluation. As discussed, please follow-up with your cardiologist for reevaluation within the next 5 to 7 days. Follow-up as needed.     ED Prescriptions   None    PDMP not reviewed this encounter.   Abran Cantor, NP 12/31/22 1233    Abran Cantor, NP 12/31/22 1233

## 2023-01-07 ENCOUNTER — Encounter: Payer: Self-pay | Admitting: Pharmacist

## 2023-01-11 ENCOUNTER — Ambulatory Visit (INDEPENDENT_AMBULATORY_CARE_PROVIDER_SITE_OTHER): Payer: No Typology Code available for payment source | Admitting: Family Medicine

## 2023-01-11 ENCOUNTER — Encounter: Payer: Self-pay | Admitting: Family Medicine

## 2023-01-11 VITALS — BP 139/86 | HR 76 | Ht 62.0 in | Wt 353.0 lb

## 2023-01-11 DIAGNOSIS — E038 Other specified hypothyroidism: Secondary | ICD-10-CM | POA: Diagnosis not present

## 2023-01-11 DIAGNOSIS — E7849 Other hyperlipidemia: Secondary | ICD-10-CM

## 2023-01-11 DIAGNOSIS — Z7984 Long term (current) use of oral hypoglycemic drugs: Secondary | ICD-10-CM

## 2023-01-11 DIAGNOSIS — R7301 Impaired fasting glucose: Secondary | ICD-10-CM

## 2023-01-11 DIAGNOSIS — G43E19 Chronic migraine with aura, intractable, without status migrainosus: Secondary | ICD-10-CM | POA: Diagnosis not present

## 2023-01-11 DIAGNOSIS — Z23 Encounter for immunization: Secondary | ICD-10-CM | POA: Diagnosis not present

## 2023-01-11 DIAGNOSIS — E559 Vitamin D deficiency, unspecified: Secondary | ICD-10-CM

## 2023-01-11 DIAGNOSIS — R7303 Prediabetes: Secondary | ICD-10-CM

## 2023-01-11 DIAGNOSIS — E1165 Type 2 diabetes mellitus with hyperglycemia: Secondary | ICD-10-CM

## 2023-01-11 DIAGNOSIS — I10 Essential (primary) hypertension: Secondary | ICD-10-CM

## 2023-01-11 MED ORDER — BLOOD GLUCOSE TEST VI STRP: 1.0000 | ORAL_STRIP | Freq: Three times a day (TID) | 0 refills | Status: DC

## 2023-01-11 MED ORDER — AMLODIPINE-OLMESARTAN 10-20 MG PO TABS
1.0000 | ORAL_TABLET | Freq: Every day | ORAL | 1 refills | Status: DC
Start: 2023-01-11 — End: 2023-03-21

## 2023-01-11 MED ORDER — LANCETS MISC. MISC
1.0000 | Freq: Three times a day (TID) | 0 refills | Status: AC
Start: 2023-01-11 — End: 2023-02-10

## 2023-01-11 MED ORDER — OZEMPIC (0.25 OR 0.5 MG/DOSE) 2 MG/3ML ~~LOC~~ SOPN
0.2500 mg | PEN_INJECTOR | SUBCUTANEOUS | 0 refills | Status: DC
Start: 2023-01-11 — End: 2023-02-11

## 2023-01-11 MED ORDER — LANCET DEVICE MISC
1.0000 | Freq: Three times a day (TID) | 0 refills | Status: AC
Start: 2023-01-11 — End: 2023-02-10

## 2023-01-11 MED ORDER — BLOOD GLUCOSE MONITORING SUPPL DEVI
1.0000 | Freq: Three times a day (TID) | 0 refills | Status: AC
Start: 2023-01-11 — End: ?

## 2023-01-11 MED ORDER — METFORMIN HCL 1000 MG PO TABS
500.0000 mg | ORAL_TABLET | Freq: Two times a day (BID) | ORAL | 3 refills | Status: DC
Start: 2023-01-11 — End: 2023-11-07

## 2023-01-11 NOTE — Progress Notes (Addendum)
Established Patient Office Visit  Subjective:  Patient ID: Jessica Sanchez, female    DOB: 1985-11-30  Age: 37 y.o. MRN: [id number]  CC:  Chief Complaint  Patient presents with   Follow-up    Following up from visit on 12/20/22, has concerns about htn has been feeling extreme fatigued and migraines.     HPI Jessica Sanchez is a 37 y.o. female with past medical history of hypertension, migraines and type 2 diabetes presents for f/u of  chronic medical conditions.   Prediabetes: Hemoglobin A1c completed on 03/30/22 was 5.9.The patient reports experiencing polyuria, polydipsia, polyphagia, and extreme fatigue. She denies nausea, vomiting, or abdominal pain.  Hypertension: The patient is taking amlodipine 5 mg daily. She reports ambulatory blood pressure readings in the 150s to 160s systolic and 90s diastolic. She complains of occasional headaches but denies chest pain, palpitations, or shortness of breath.  Migraines: The patient's migraines are stable with treatment, using sumatriptan and gabapentin 100 mg at bedtime.   Past Medical History:  Diagnosis Date   Angio-edema    Arthritis    right shoulder   Asthma    Dyspnea    Dysrhythmia    hx of SVT   Fibroid (bleeding) (uterine)    Fibroid, uterine    H/O metrorrhagia    Hypertension    IUD (intrauterine device) in place 07/11/2015   Menorrhagia    Obesity, Class III, BMI 40-49.9 (morbid obesity) (HCC) 11/11/2018   Sleep apnea    uses CPAP - setting 11   SVT (supraventricular tachycardia)     Past Surgical History:  Procedure Laterality Date   DILATATION AND CURETTAGE/HYSTEROSCOPY WITH MINERVA N/A 12/01/2021   Procedure: DILATATION AND CURETTAGE/HYSTEROSCOPY WITH MINERVA;  Surgeon: Lazaro Arms, MD;  Location: [facility];  Service: Gynecology;  Laterality: N/A;   HYSTEROSCOPY N/A 09/17/2020   Procedure: HYSTEROSCOPY;  Surgeon: Lazaro Arms, MD;  Location: [facility];  Service: Gynecology;  Laterality: N/A;   HYSTEROSCOPY  WITH D & C N/A 09/27/2013   Procedure: DILATATION AND CURETTAGE /HYSTEROSCOPY and insertion of Mirena IUD ;  Surgeon: Freddrick March. Tenny Craw, MD;  Location: [facility];  Service: Gynecology;  Laterality: N/A;   IUD REMOVAL N/A 09/17/2020   Procedure: INTRAUTERINE DEVICE (IUD) REMOVAL (2);  Surgeon: Lazaro Arms, MD;  Location: [facility];  Service: Gynecology;  Laterality: N/A;   MYOMECTOMY  02/03/2011   Procedure: MYOMECTOMY;  Surgeon: Lazaro Arms, MD;  Location: [facility];  Service: Gynecology;  Laterality: N/A;   SVT ABLATION N/A 11/08/2018   Procedure: SVT ABLATION;  Surgeon: Marinus Maw, MD;  Location: [facility];  Service: Cardiovascular;  Laterality: N/A;   SVT ABLATION N/A 12/26/2020   Procedure: SVT ABLATION;  Surgeon: Marinus Maw, MD;  Location: [facility];  Service: Cardiovascular;  Laterality: N/A;   TONSILLECTOMY     TYMPANOSTOMY TUBE PLACEMENT      Family History  Problem Relation Age of Onset   Cirrhosis Mother    Diabetes Mother    Cancer Maternal Grandfather    Hypertension Sister    Diabetes Sister    Anesthesia problems Neg Hx    Hypotension Neg Hx    Malignant hyperthermia Neg Hx    Pseudochol deficiency Neg Hx     Social History   Socioeconomic History   Marital status: Single    Spouse name: Not on file   Number of children: Not on file   Years of education:  Not on file   Highest education level: Not on file  Occupational History   Not on file  Tobacco Use   Smoking status: Never   Smokeless tobacco: Never   Tobacco comments:    socially  Vaping Use   Vaping status: Never Used  Substance and Sexual Activity   Alcohol use: Yes    Comment: occasional   Drug use: Yes    Types: Marijuana    Comment: on occ   Sexual activity: Yes    Birth control/protection: Condom  Other Topics Concern   Not on file  Social History Narrative   Not on file   Social Determinants of Health   Financial Resource Strain: Not on file  Food Insecurity:  Not on file  Transportation Needs: Not on file  Physical Activity: Not on file  Stress: Not on file  Social Connections: Not on file  Intimate Partner Violence: Not on file    Outpatient Medications Prior to Visit  Medication Sig Dispense Refill   albuterol (VENTOLIN HFA) 108 (90 Base) MCG/ACT inhaler Inhale 2 puffs into the lungs every 6 (six) hours as needed for wheezing or shortness of breath. 8 g 0   famotidine (PEPCID) 20 MG tablet Take 1 tablet (20 mg total) by mouth 2 (two) times daily as needed. 60 tablet 2   fexofenadine (ALLEGRA) 180 MG tablet Take 180 mg by mouth daily.     fluticasone furoate-vilanterol (BREO ELLIPTA) 200-25 MCG/ACT AEPB Inhale 1 puff into the lungs daily. 1 each 5   gabapentin (NEURONTIN) 100 MG capsule Take 1 capsule (100 mg total) by mouth at bedtime. (Patient taking differently: Take 100 mg by mouth daily as needed (pain).) 90 capsule 1   levocetirizine (XYZAL) 5 MG tablet Take 1 tablet (5 mg total) by mouth in the morning and at bedtime. 60 tablet 0   montelukast (SINGULAIR) 10 MG tablet Take 1 tablet (10 mg total) by mouth at bedtime. 30 tablet 2   Multiple Vitamins-Calcium (ONE-A-DAY WOMENS PO) Take 1 tablet by mouth daily.     NON FORMULARY Pt uses a cpap nightly     Relugolix-Estradiol-Norethind (MYFEMBREE) 40-1-0.5 MG TABS Take 1 tablet by mouth daily. 30 tablet 11   SUMAtriptan (IMITREX) 100 MG tablet Take 1 tablet (100 mg total) by mouth every 2 (two) hours as needed for migraine. May repeat in 2 hours if headache persists or recurs. 10 tablet 2   Vitamin D, Ergocalciferol, (DRISDOL) 1.25 MG (50000 UNIT) CAPS capsule Take 1 capsule (50,000 Units total) by mouth every 7 (seven) days. 20 capsule 2   amLODipine (NORVASC) 5 MG tablet Take 1 tablet (5 mg total) by mouth daily. 30 tablet 3   No facility-administered medications prior to visit.    Allergies  Allergen Reactions   Shellfish Allergy Shortness Of Breath, Itching and Swelling    Mouth became  swollen, throat started itching, and developed shortness of breath   Sulfa Antibiotics Itching, Shortness Of Breath and Swelling    Mouth became swollen, throat started itching, and developed shortness of breath   Adhesive [Tape] Itching    Depends on the type of medical tape    ROS Review of Systems  Constitutional:  Negative for chills and fever.  Eyes:  Negative for visual disturbance.  Respiratory:  Negative for chest tightness and shortness of breath.   Endocrine: Positive for polydipsia, polyphagia and polyuria.  Neurological:  Positive for weakness. Negative for dizziness and headaches.      Objective:  Physical Exam HENT:     Head: Normocephalic.     Mouth/Throat:     Mouth: Mucous membranes are moist.  Cardiovascular:     Rate and Rhythm: Normal rate.     Heart sounds: Normal heart sounds.  Pulmonary:     Effort: Pulmonary effort is normal.     Breath sounds: Normal breath sounds.  Neurological:     Mental Status: She is alert.     BP 139/86   Pulse 76   Ht 5\' 2"  (1.575 m)   Wt (!) 353 lb 0.6 oz (160.1 kg)   LMP 12/26/2022 (Approximate)   SpO2 96%   BMI 64.57 kg/m  Wt Readings from Last 3 Encounters:  01/11/23 (!) 353 lb 0.6 oz (160.1 kg)  12/30/22 (!) 340 lb (154.2 kg)  12/20/22 (!) 353 lb 9.6 oz (160.4 kg)    Lab Results  Component Value Date   TSH 1.310 03/30/2022   Lab Results  Component Value Date   WBC 5.5 12/30/2022   HGB 11.5 (L) 12/30/2022   HCT 37.7 12/30/2022   MCV 86.3 12/30/2022   PLT 293 12/30/2022   Lab Results  Component Value Date   NA 135 12/30/2022   K 4.1 12/30/2022   CO2 25 12/30/2022   GLUCOSE 76 12/30/2022   BUN 13 12/30/2022   CREATININE 0.55 12/30/2022   BILITOT 0.6 07/26/2022   ALKPHOS 56 07/26/2022   AST 16 07/26/2022   ALT 16 07/26/2022   PROT 7.2 07/26/2022   ALBUMIN 3.6 07/26/2022   CALCIUM 8.8 (L) 12/30/2022   ANIONGAP 9 12/30/2022   EGFR 117 09/28/2022   Lab Results  Component Value Date   CHOL  135 03/30/2022   Lab Results  Component Value Date   HDL 33 (L) 03/30/2022   Lab Results  Component Value Date   LDLCALC 93 03/30/2022   Lab Results  Component Value Date   TRIG 35 03/30/2022   Lab Results  Component Value Date   CHOLHDL 4.1 03/30/2022   Lab Results  Component Value Date   HGBA1C 5.9 (H) 03/30/2022      Assessment & Plan:  Prediabetes Assessment & Plan: Management Plan for Prediabetes  -Medication:Ozempic: Initiate treatment with 0.25 mg weekly. Schedule monthly follow-up appointments for dose adjustments. -Metformin: Administer 500 mg twice daily (BID). Target Blood Glucose Levels: Fasting Blood Glucose: 80-130 mg/dL Preprandial (Before Lunch and Dinner): Less than 140 mg/dL Postprandial (2 Hours After Meals): Less than 180 mg/dL Dietary Recommendations: Reduce High-Sugar Foods and Beverages: Minimize intake of items high in sugar. Increase Nutrient-Rich Foods: Focus on consuming fruits, vegetables, and whole grains. Incorporate Lean Proteins: Include sources such as chicken, fish, beans, and legumes in your diet. Opt for Low-Fat Dairy Products: Choose dairy options that are lower in fat. Limit Unhealthy Fats: Reduce intake of saturated fats, trans fats, and cholesterol. Physical Activity: -Aim for Regular Exercise: Engage in at least 30 minutes of brisk walking or similar physical activities on a minimum of 5 days per week.   Orders: -     Ozempic (0.25 or 0.5 MG/DOSE); Inject 0.25 mg into the skin once a week.  Dispense: 3 mL; Refill: 0 -     metFORMIN HCl; Take 0.5 tablets (500 mg total) by mouth 2 (two) times daily with a meal.  Dispense: 180 tablet; Refill: 3 -     Blood Glucose Monitoring Suppl; 1 each by Does not apply route in the morning, at noon, and at bedtime. May substitute  to any manufacturer covered by patient's insurance.  Dispense: 1 each; Refill: 0 -     Blood Glucose Test; 1 each by In Vitro route in the morning, at noon, and at  bedtime. May substitute to any manufacturer covered by patient's insurance.  Dispense: 100 strip; Refill: 0 -     Lancet Device; 1 each by Does not apply route in the morning, at noon, and at bedtime. May substitute to any manufacturer covered by patient's insurance.  Dispense: 1 each; Refill: 0 -     Lancets Misc.; 1 each by Does not apply route in the morning, at noon, and at bedtime. May substitute to any manufacturer covered by patient's insurance.  Dispense: 100 each; Refill: 0  Essential hypertension Assessment & Plan: Hypertension Management -Start taking olmesartan-amlodipine 20-10 mg daily   Medication Instructions: Take your blood pressure medication at the same time each day. After taking your medication, check your blood pressure at least an hour later. If your first reading is >140/90 mmHg, wait at least 10 minutes and recheck your blood pressure. Side Effects: In the initial days of therapy, you may experience dizziness or lightheadedness as your body adjusts to the lower blood pressure; this is expected. Diet and Lifestyle: Adhere to a low-sodium diet, limiting intake to less than 1500 mg daily, and increase your physical activity. Avoid over-the-counter NSAIDs such as ibuprofen and naproxen while on this medication. Hydration and Nutrition: Stay well-hydrated by drinking at least 64 ounces of water daily. Increase your servings of fruits and vegetables and avoid excessive sodium in your diet. Long-Term Considerations: Uncontrolled hypertension can increase the risk of cardiovascular diseases, including stroke, coronary artery disease, and heart failure.     Intractable chronic migraine with aura and without status migrainosus Assessment & Plan: Encouraged to continue taking sumatriptan and gabapentin at bedtime   IFG (impaired fasting glucose) -     Hemoglobin A1c  Vitamin D deficiency -     VITAMIN D 25 Hydroxy (Vit-D Deficiency, Fractures)  Other specified  hypothyroidism -     TSH + free T4  Other hyperlipidemia -     Lipid panel -     CMP14+EGFR -     CBC with Differential/Platelet  Primary hypertension -     amLODIPine-Olmesartan; Take 1 tablet by mouth daily.  Dispense: 30 tablet; Refill: 1  Encounter for immunization -     Flu vaccine trivalent PF, 6mos and older(Flulaval,Afluria,Fluarix,Fluzone)   Note: This chart has been completed using Engineer, civil (consulting) software, and while attempts have been made to ensure accuracy, certain words and phrases may not be transcribed as intended.   Follow-up: Return in about 1 month (around 02/10/2023).   Gilmore Laroche, FNP

## 2023-01-11 NOTE — Patient Instructions (Addendum)
I appreciate the opportunity to provide care to you today!    Follow up:  1 month for BP  Labs: please stop by the lab today to get your blood drawn (CBC, CMP, TSH, Lipid profile, HgA1c, Vit D)  Management Plan for prediabetes  -Medication:Ozempic: Initiate treatment with 0.25 mg weekly. Schedule monthly follow-up appointments for dose adjustments.Metformin: Administer 500 mg twice daily (BID). Target Blood Glucose Levels: Fasting Blood Glucose: 80-130 mg/dL Preprandial (Before Lunch and Dinner): Less than 140 mg/dL Postprandial (2 Hours After Meals): Less than 180 mg/dL Dietary Recommendations: Reduce High-Sugar Foods and Beverages: Minimize intake of items high in sugar. Increase Nutrient-Rich Foods: Focus on consuming fruits, vegetables, and whole grains. Incorporate Lean Proteins: Include sources such as chicken, fish, beans, and legumes in your diet. Opt for Low-Fat Dairy Products: Choose dairy options that are lower in fat. Limit Unhealthy Fats: Reduce intake of saturated fats, trans fats, and cholesterol. Physical Activity: -Aim for Regular Exercise: Engage in at least 30 minutes of brisk walking or similar physical activities on a minimum of 5 days per week.  Hypertension Management -Start taking olmesartan-amlodipine 20-10 mg daily   Medication Instructions: Take your blood pressure medication at the same time each day. After taking your medication, check your blood pressure at least an hour later. If your first reading is >140/90 mmHg, wait at least 10 minutes and recheck your blood pressure. Side Effects: In the initial days of therapy, you may experience dizziness or lightheadedness as your body adjusts to the lower blood pressure; this is expected. Diet and Lifestyle: Adhere to a low-sodium diet, limiting intake to less than 1500 mg daily, and increase your physical activity. Avoid over-the-counter NSAIDs such as ibuprofen and naproxen while on this medication. Hydration and  Nutrition: Stay well-hydrated by drinking at least 64 ounces of water daily. Increase your servings of fruits and vegetables and avoid excessive sodium in your diet. Long-Term Considerations: Uncontrolled hypertension can increase the risk of cardiovascular diseases, including stroke, coronary artery disease, and heart failure.      Please continue to a heart-healthy diet and increase your physical activities. Try to exercise for at least five days a week.    It was a pleasure to see you and I look forward to continuing to work together on your health and well-being. Please do not hesitate to call the office if you need care or have questions about your care.  In case of emergency, please visit the Emergency Department for urgent care, or contact our clinic at [phone] to schedule an appointment. We're here to help you!   Have a wonderful day and week. With Gratitude, Gilmore Laroche MSN, FNP-BC

## 2023-01-12 DIAGNOSIS — R7303 Prediabetes: Secondary | ICD-10-CM | POA: Insufficient documentation

## 2023-01-12 DIAGNOSIS — E1165 Type 2 diabetes mellitus with hyperglycemia: Secondary | ICD-10-CM | POA: Insufficient documentation

## 2023-01-12 NOTE — Assessment & Plan Note (Signed)
Encouraged to continue taking sumatriptan and gabapentin at bedtime

## 2023-01-12 NOTE — Assessment & Plan Note (Addendum)
Management Plan for Prediabetes  -Medication:Ozempic: Initiate treatment with 0.25 mg weekly. Schedule monthly follow-up appointments for dose adjustments. -Metformin: Administer 500 mg twice daily (BID). Target Blood Glucose Levels: Fasting Blood Glucose: 80-130 mg/dL Preprandial (Before Lunch and Dinner): Less than 140 mg/dL Postprandial (2 Hours After Meals): Less than 180 mg/dL Dietary Recommendations: Reduce High-Sugar Foods and Beverages: Minimize intake of items high in sugar. Increase Nutrient-Rich Foods: Focus on consuming fruits, vegetables, and whole grains. Incorporate Lean Proteins: Include sources such as chicken, fish, beans, and legumes in your diet. Opt for Low-Fat Dairy Products: Choose dairy options that are lower in fat. Limit Unhealthy Fats: Reduce intake of saturated fats, trans fats, and cholesterol. Physical Activity: -Aim for Regular Exercise: Engage in at least 30 minutes of brisk walking or similar physical activities on a minimum of 5 days per week.

## 2023-01-12 NOTE — Assessment & Plan Note (Signed)
Hypertension Management -Start taking olmesartan-amlodipine 20-10 mg daily   Medication Instructions: Take your blood pressure medication at the same time each day. After taking your medication, check your blood pressure at least an hour later. If your first reading is >140/90 mmHg, wait at least 10 minutes and recheck your blood pressure. Side Effects: In the initial days of therapy, you may experience dizziness or lightheadedness as your body adjusts to the lower blood pressure; this is expected. Diet and Lifestyle: Adhere to a low-sodium diet, limiting intake to less than 1500 mg daily, and increase your physical activity. Avoid over-the-counter NSAIDs such as ibuprofen and naproxen while on this medication. Hydration and Nutrition: Stay well-hydrated by drinking at least 64 ounces of water daily. Increase your servings of fruits and vegetables and avoid excessive sodium in your diet. Long-Term Considerations: Uncontrolled hypertension can increase the risk of cardiovascular diseases, including stroke, coronary artery disease, and heart failure.

## 2023-01-14 ENCOUNTER — Other Ambulatory Visit: Payer: Self-pay | Admitting: Obstetrics & Gynecology

## 2023-01-14 DIAGNOSIS — Z01818 Encounter for other preprocedural examination: Secondary | ICD-10-CM

## 2023-01-14 NOTE — Progress Notes (Signed)
Dr Johnnette Litter reviewed Cardiac history and recent ED visit, No orders given.

## 2023-01-14 NOTE — Patient Instructions (Signed)
Your procedure is scheduled on: 01/19/2023  Report to [facility] Main Entrance at  8:30   AM.  Call this number if you have problems the morning of surgery: [phone]   Remember:   Do not Eat after midnight, you may have CLEAR liquids until 5:30 am  Drink 1 Gatorade Zero at 5:30        No Smoking the morning of surgery  :  Take these medicines the morning of surgery with A SIP OF WATER: Pepcid, Allegra and gabapetin if needed   Do not wear jewelry, make-up or nail polish.  Do not wear lotions, powders, or perfumes. You may wear deodorant.  Do not shave 48 hours prior to surgery. Men may shave face and neck.  Do not bring valuables to the hospital.  Contacts, dentures or bridgework may not be worn into surgery.  Leave suitcase in the car. After surgery it may be brought to your room.  For patients admitted to the hospital, checkout time is 11:00 AM the day of discharge.   Patients discharged the day of surgery will not be allowed to drive home.    Special Instructions: Shower using CHG night before surgery and shower the day of surgery use CHG.  Use special wash - you have one bottle of CHG for all showers.  You should use approximately 1/2 of the bottle for each shower. How to Use Chlorhexidine Before Surgery Chlorhexidine gluconate (CHG) is a germ-killing (antiseptic) solution that is used to clean the skin. It can get rid of the bacteria that normally live on the skin and can keep them away for about 24 hours. To clean your skin with CHG, you may be given: A CHG solution to use in the shower or as part of a sponge bath. A prepackaged cloth that contains CHG. Cleaning your skin with CHG may help lower the risk for infection: While you are staying in the intensive care unit of the hospital. If you have a vascular access, such as a central line, to provide short-term or long-term access to your veins. If you have a catheter to drain urine from your bladder. If you are on a  ventilator. A ventilator is a machine that helps you breathe by moving air in and out of your lungs. After surgery. What are the risks? Risks of using CHG include: A skin reaction. Hearing loss, if CHG gets in your ears and you have a perforated eardrum. Eye injury, if CHG gets in your eyes and is not rinsed out. The CHG product catching fire. Make sure that you avoid smoking and flames after applying CHG to your skin. Do not use CHG: If you have a chlorhexidine allergy or have previously reacted to chlorhexidine. On babies younger than 52 months of age. How to use CHG solution Use CHG only as told by your health care provider, and follow the instructions on the label. Use the full amount of CHG as directed. Usually, this is one bottle. During a shower Follow these steps when using CHG solution during a shower (unless your health care provider gives you different instructions): Start the shower. Use your normal soap and shampoo to wash your face and hair. Turn off the shower or move out of the shower stream. Pour the CHG onto a clean washcloth. Do not use any type of brush or rough-edged sponge. Starting at your neck, lather your body down to your toes. Make sure you follow these instructions: If you will be having surgery,  pay special attention to the part of your body where you will be having surgery. Scrub this area for at least 1 minute. Do not use CHG on your head or face. If the solution gets into your ears or eyes, rinse them well with water. Avoid your genital area. Avoid any areas of skin that have broken skin, cuts, or scrapes. Scrub your back and under your arms. Make sure to wash skin folds. Let the lather sit on your skin for 1-2 minutes or as long as told by your health care provider. Thoroughly rinse your entire body in the shower. Make sure that all body creases and crevices are rinsed well. Dry off with a clean towel. Do not put any substances on your body afterward--such  as powder, lotion, or perfume--unless you are told to do so by your health care provider. Only use lotions that are recommended by the manufacturer. Put on clean clothes or pajamas. If it is the night before your surgery, sleep in clean sheets.  During a sponge bath Follow these steps when using CHG solution during a sponge bath (unless your health care provider gives you different instructions): Use your normal soap and shampoo to wash your face and hair. Pour the CHG onto a clean washcloth. Starting at your neck, lather your body down to your toes. Make sure you follow these instructions: If you will be having surgery, pay special attention to the part of your body where you will be having surgery. Scrub this area for at least 1 minute. Do not use CHG on your head or face. If the solution gets into your ears or eyes, rinse them well with water. Avoid your genital area. Avoid any areas of skin that have broken skin, cuts, or scrapes. Scrub your back and under your arms. Make sure to wash skin folds. Let the lather sit on your skin for 1-2 minutes or as long as told by your health care provider. Using a different clean, wet washcloth, thoroughly rinse your entire body. Make sure that all body creases and crevices are rinsed well. Dry off with a clean towel. Do not put any substances on your body afterward--such as powder, lotion, or perfume--unless you are told to do so by your health care provider. Only use lotions that are recommended by the manufacturer. Put on clean clothes or pajamas. If it is the night before your surgery, sleep in clean sheets. How to use CHG prepackaged cloths Only use CHG cloths as told by your health care provider, and follow the instructions on the label. Use the CHG cloth on clean, dry skin. Do not use the CHG cloth on your head or face unless your health care provider tells you to. When washing with the CHG cloth: Avoid your genital area. Avoid any areas of skin  that have broken skin, cuts, or scrapes. Before surgery Follow these steps when using a CHG cloth to clean before surgery (unless your health care provider gives you different instructions): Using the CHG cloth, vigorously scrub the part of your body where you will be having surgery. Scrub using a back-and-forth motion for 3 minutes. The area on your body should be completely wet with CHG when you are done scrubbing. Do not rinse. Discard the cloth and let the area air-dry. Do not put any substances on the area afterward, such as powder, lotion, or perfume. Put on clean clothes or pajamas. If it is the night before your surgery, sleep in clean sheets.  For general  bathing Follow these steps when using CHG cloths for general bathing (unless your health care provider gives you different instructions). Use a separate CHG cloth for each area of your body. Make sure you wash between any folds of skin and between your fingers and toes. Wash your body in the following order, switching to a new cloth after each step: The front of your neck, shoulders, and chest. Both of your arms, under your arms, and your hands. Your stomach and groin area, avoiding the genitals. Your right leg and foot. Your left leg and foot. The back of your neck, your back, and your buttocks. Do not rinse. Discard the cloth and let the area air-dry. Do not put any substances on your body afterward--such as powder, lotion, or perfume--unless you are told to do so by your health care provider. Only use lotions that are recommended by the manufacturer. Put on clean clothes or pajamas. Contact a health care provider if: Your skin gets irritated after scrubbing. You have questions about using your solution or cloth. You swallow any chlorhexidine. Call your local poison control center (236-314-9835 in the [place] Get help right away if: Your eyes itch badly, or they become very red or swollen. Your skin itches badly and is red or  swollen. Your hearing changes. You have trouble seeing. You have swelling or tingling in your mouth or throat. You have trouble breathing. These symptoms may represent a serious problem that is an emergency. Do not wait to see if the symptoms will go away. Get medical help right away. Call your local emergency services (911 in the [place] Do not drive yourself to the hospital. Summary Chlorhexidine gluconate (CHG) is a germ-killing (antiseptic) solution that is used to clean the skin. Cleaning your skin with CHG may help to lower your risk for infection. You may be given CHG to use for bathing. It may be in a bottle or in a prepackaged cloth to use on your skin. Carefully follow your health care provider's instructions and the instructions on the product label. Do not use CHG if you have a chlorhexidine allergy. Contact your health care provider if your skin gets irritated after scrubbing. This information is not intended to replace advice given to you by your health care provider. Make sure you discuss any questions you have with your health care provider. Document Revised: 08/17/2021 Document Reviewed: 06/30/2020 Elsevier Patient Education  2023 [employer] Total Laparoscopic Hysterectomy, Care After The following information offers guidance on how to care for yourself after your procedure. Your health care provider may also give you more specific instructions. If you have problems or questions, contact your health care provider. What can I expect after the procedure? After the procedure, it is common to have: Pain, bruising, and numbness around your incisions. Tiredness (fatigue). Poor appetite. Less interest in sex. Vaginal discharge or bleeding. You will need to use a sanitary pad after this procedure. Feelings of sadness or other emotions. If your ovaries were also removed, it is also common to have symptoms of menopause, such as hot flashes, night sweats, and lack of sleep  (insomnia). Follow these instructions at home: Medicines Take over-the-counter and prescription medicines only as told by your health care provider. Ask your health care provider if the medicine prescribed to you: Requires you to avoid driving or using machinery. Can cause constipation. You may need to take these actions to prevent or treat constipation: Drink enough fluid to keep your urine pale yellow. Take over-the-counter or prescription  medicines. Eat foods that are high in fiber, such as beans, whole grains, and fresh fruits and vegetables. Limit foods that are high in fat and processed sugars, such as fried or sweet foods. Incision care  Follow instructions from your health care provider about how to take care of your incisions. Make sure you: Wash your hands with soap and water for at least 20 seconds before and after you change your bandage (dressing). If soap and water are not available, use hand sanitizer. Change your dressing as told by your health care provider. Leave stitches (sutures), skin glue, or adhesive strips in place. These skin closures may need to stay in place for 2 weeks or longer. If adhesive strip edges start to loosen and curl up, you may trim the loose edges. Do not remove adhesive strips completely unless your health care provider tells you to do that. Check your incision areas every day for signs of infection. Check for: More redness, swelling, or pain. Fluid or blood. Warmth. Pus or a bad smell. Activity  Rest as told by your health care provider. Avoid sitting for a long time without moving. Get up to take short walks every 1-2 hours. This is important to improve blood flow and breathing. Ask for help if you feel weak or unsteady. Return to your normal activities as told by your health care provider. Ask your health care provider what activities are safe for you. Do not lift anything that is heavier than 10 lb (4.5 kg), or the limit that you are told, for  one month after surgery or until your health care provider says that it is safe. If you were given a sedative during the procedure, it can affect you for several hours. Do not drive or operate machinery until your health care provider says that it is safe. Lifestyle Do not use any products that contain nicotine or tobacco. These products include cigarettes, chewing tobacco, and vaping devices, such as e-cigarettes. These can delay healing after surgery. If you need help quitting, ask your health care provider. Do not drink alcohol until your health care provider approves. General instructions  Do not douche, use tampons, or have sex for at least 6 weeks, or as told by your health care provider. If you struggle with physical or emotional changes after your procedure, speak with your health care provider or a therapist. Do not take baths, swim, or use a hot tub until your health care provider approves. You may only be allowed to take showers for 2-3 weeks. Keep your dressing dry until your health care provider says it can be removed. Try to have someone at home with you for the first 1-2 weeks to help with your daily chores. Wear compression stockings as told by your health care provider. These stockings help to prevent blood clots and reduce swelling in your legs. Keep all follow-up visits. This is important. Contact a health care provider if: You have any of these signs of infection: Chills or a fever. More redness, swelling, or pain around an incision. Fluid or blood coming from an incision. Warmth coming from an incision. Pus or a bad smell coming from an incision. An incision opens. You feel dizzy or light-headed. You have pain or bleeding when you urinate, or you are unable to urinate. You have abnormal vaginal discharge. You have pain that does not get better with medicine. Get help right away if: You have a fever and your symptoms suddenly get worse. You have severe abdominal  pain. You have chest pain or shortness of breath. You faint. You have pain, swelling, or redness in your leg. You have heavy vaginal bleeding with blood clots, soaking through a sanitary pad in less than 1 hour. These symptoms may represent a serious problem that is an emergency. Do not wait to see if the symptoms will go away. Get medical help right away. Call your local emergency services (911 in the [place] Do not drive yourself to the hospital. Summary After the procedure, it is common to have pain and bruising around your incisions. Do not take baths, swim, or use a hot tub until your health care provider approves. Do not lift anything that is heavier than 10 lb (4.5 kg), or the limit that you are told, for one month after surgery or until your health care provider says that it is safe. Tell your health care provider if you have any signs or symptoms of infection after the procedure. Get help right away if you have severe abdominal pain, chest pain, shortness of breath, or heavy bleeding from your vagina. This information is not intended to replace advice given to you by your health care provider. Make sure you discuss any questions you have with your health care provider. Document Revised: 12/20/2019 Document Reviewed: 12/21/2019 Elsevier Patient Education  2024 Else[employer] Inc. General Anesthesia, Adult, Care After The following information offers guidance on how to care for yourself after your procedure. Your health care provider may also give you more specific instructions. If you have problems or questions, contact your health care provider. What can I expect after the procedure? After the procedure, it is common for people to: Have pain or discomfort at the IV site. Have nausea or vomiting. Have a sore throat or hoarseness. Have trouble concentrating. Feel cold or chills. Feel weak, sleepy, or tired (fatigue). Have soreness and body aches. These can affect parts of the body that were  not involved in surgery. Follow these instructions at home: For the time period you were told by your health care provider:  Rest. Do not participate in activities where you could fall or become injured. Do not drive or use machinery. Do not drink alcohol. Do not take sleeping pills or medicines that cause drowsiness. Do not make important decisions or sign legal documents. Do not take care of children on your own. General instructions Drink enough fluid to keep your urine pale yellow. If you have sleep apnea, surgery and certain medicines can increase your risk for breathing problems. Follow instructions from your health care provider about wearing your sleep device: Anytime you are sleeping, including during daytime naps. While taking prescription pain medicines, sleeping medicines, or medicines that make you drowsy. Return to your normal activities as told by your health care provider. Ask your health care provider what activities are safe for you. Take over-the-counter and prescription medicines only as told by your health care provider. Do not use any products that contain nicotine or tobacco. These products include cigarettes, chewing tobacco, and vaping devices, such as e-cigarettes. These can delay incision healing after surgery. If you need help quitting, ask your health care provider. Contact a health care provider if: You have nausea or vomiting that does not get better with medicine. You vomit every time you eat or drink. You have pain that does not get better with medicine. You cannot urinate or have bloody urine. You develop a skin rash. You have a fever. Get help right away if: You have trouble breathing. You  have chest pain. You vomit blood. These symptoms may be an emergency. Get help right away. Call 911. Do not wait to see if the symptoms will go away. Do not drive yourself to the hospital. Summary After the procedure, it is common to have a sore throat,  hoarseness, nausea, vomiting, or to feel weak, sleepy, or fatigue. For the time period you were told by your health care provider, do not drive or use machinery. Get help right away if you have difficulty breathing, have chest pain, or vomit blood. These symptoms may be an emergency. This information is not intended to replace advice given to you by your health care provider. Make sure you discuss any questions you have with your health care provider. Document Revised: 07/17/2021 Document Reviewed: 07/17/2021 Elsevier Patient Education  2024 [employer].

## 2023-01-16 ENCOUNTER — Telehealth: Payer: Self-pay | Admitting: Family Medicine

## 2023-01-16 NOTE — Telephone Encounter (Signed)
I called and spoke with the patient, requesting that she return for lab tests to reassess her hemoglobin A1c. I also informed her that Ozempic has been denied by her insurance.

## 2023-01-17 ENCOUNTER — Encounter: Payer: Self-pay | Admitting: Obstetrics & Gynecology

## 2023-01-17 ENCOUNTER — Encounter (HOSPITAL_COMMUNITY): Payer: Self-pay

## 2023-01-17 ENCOUNTER — Encounter (HOSPITAL_COMMUNITY)
Admission: RE | Admit: 2023-01-17 | Discharge: 2023-01-17 | Disposition: A | Discharge status: Home / Self Care | Payer: No Typology Code available for payment source | Source: Ambulatory Visit | Attending: Obstetrics & Gynecology

## 2023-01-17 VITALS — BP 139/86 | HR 76 | Temp 97.8°F | Resp 18 | Ht 62.0 in | Wt 353.0 lb

## 2023-01-17 DIAGNOSIS — Z01812 Encounter for preprocedural laboratory examination: Secondary | ICD-10-CM | POA: Diagnosis not present

## 2023-01-17 DIAGNOSIS — Z01818 Encounter for other preprocedural examination: Secondary | ICD-10-CM

## 2023-01-17 DIAGNOSIS — E1165 Type 2 diabetes mellitus with hyperglycemia: Secondary | ICD-10-CM | POA: Insufficient documentation

## 2023-01-17 HISTORY — DX: Anxiety disorder, unspecified: F41.9

## 2023-01-17 HISTORY — DX: Type 2 diabetes mellitus without complications: E11.9

## 2023-01-17 LAB — COMPREHENSIVE METABOLIC PANEL
ALT: 16 U/L (ref 0–44)
AST: 14 U/L — ABNORMAL LOW (ref 15–41)
Albumin: 3.8 g/dL (ref 3.5–5.0)
Alkaline Phosphatase: 62 U/L (ref 38–126)
Anion gap: 11 (ref 5–15)
BUN: 15 mg/dL (ref 6–20)
CO2: 24 mmol/L (ref 22–32)
Calcium: 8.9 mg/dL (ref 8.9–10.3)
Chloride: 99 mmol/L (ref 98–111)
Creatinine, Ser: 0.71 mg/dL (ref 0.44–1.00)
GFR, Estimated: >60 mL/min (ref >60)
Glucose, Bld: 90 mg/dL (ref 70–99)
Potassium: 4.3 mmol/L (ref 3.5–5.1)
Sodium: 134 mmol/L — ABNORMAL LOW (ref 135–145)
Total Bilirubin: 0.8 mg/dL (ref 0.3–1.2)
Total Protein: 7.5 g/dL (ref 6.5–8.1)

## 2023-01-17 LAB — URINALYSIS, ROUTINE W REFLEX MICROSCOPIC
Bacteria, UA: NONE SEEN
Bilirubin Urine: NEGATIVE
Glucose, UA: NEGATIVE mg/dL
Ketones, ur: NEGATIVE mg/dL
Leukocytes,Ua: NEGATIVE
Nitrite: NEGATIVE
Protein, ur: NEGATIVE mg/dL
Specific Gravity, Urine: 1.016 (ref 1.005–1.030)
pH: 6 (ref 5.0–8.0)

## 2023-01-17 LAB — HEMOGLOBIN A1C
Hgb A1c MFr Bld: 6.3 % — ABNORMAL HIGH (ref 4.8–5.6)
Mean Plasma Glucose: 134 mg/dL

## 2023-01-17 LAB — CBC
HCT: 38.7 % (ref 36.0–46.0)
Hemoglobin: 12.1 g/dL (ref 12.0–15.0)
MCH: 26.6 pg (ref 26.0–34.0)
MCHC: 31.3 g/dL (ref 30.0–36.0)
MCV: 85.1 fL (ref 80.0–100.0)
Platelets: 246 10*3/uL (ref 150–400)
RBC: 4.55 MIL/uL (ref 3.87–5.11)
RDW: 15.3 % (ref 11.5–15.5)
WBC: 4.8 10*3/uL (ref 4.0–10.5)
nRBC: 0 % (ref 0.0–0.2)

## 2023-01-17 LAB — RAPID HIV SCREEN (HIV 1/2 AB+AG)
HIV 1/2 Antibodies: NONREACTIVE
HIV-1 P24 Antigen - HIV24: NONREACTIVE

## 2023-01-17 LAB — PREGNANCY, URINE: Preg Test, Ur: NEGATIVE

## 2023-01-18 LAB — CMP14+EGFR
ALT: 13 IU/L (ref 0–32)
AST: 12 IU/L (ref 0–40)
Albumin: 4.2 g/dL (ref 3.9–4.9)
Alkaline Phosphatase: 78 IU/L (ref 44–121)
BUN/Creatinine Ratio: 19 (ref 9–23)
BUN: 13 mg/dL (ref 6–20)
Bilirubin Total: 0.5 mg/dL (ref 0.0–1.2)
CO2: 25 mmol/L (ref 20–29)
Calcium: 9.4 mg/dL (ref 8.7–10.2)
Chloride: 98 mmol/L (ref 96–106)
Creatinine, Ser: 0.7 mg/dL (ref 0.57–1.00)
Globulin, Total: 2.8 g/dL (ref 1.5–4.5)
Glucose: 84 mg/dL (ref 70–99)
Potassium: 4.7 mmol/L (ref 3.5–5.2)
Sodium: 136 mmol/L (ref 134–144)
Total Protein: 7 g/dL (ref 6.0–8.5)
eGFR: 114 mL/min/{1.73_m2} (ref >59)

## 2023-01-18 LAB — CBC WITH DIFFERENTIAL/PLATELET
Basophils Absolute: 0 10*3/uL (ref 0.0–0.2)
Basos: 0 %
EOS (ABSOLUTE): 0.1 10*3/uL (ref 0.0–0.4)
Eos: 2 %
Hematocrit: 36.7 % (ref 34.0–46.6)
Hemoglobin: 11.8 g/dL (ref 11.1–15.9)
Immature Grans (Abs): 0 10*3/uL (ref 0.0–0.1)
Immature Granulocytes: 0 %
Lymphocytes Absolute: 1.8 10*3/uL (ref 0.7–3.1)
Lymphs: 37 %
MCH: 26.9 pg (ref 26.6–33.0)
MCHC: 32.2 g/dL (ref 31.5–35.7)
MCV: 84 fL (ref 79–97)
Monocytes Absolute: 0.5 10*3/uL (ref 0.1–0.9)
Monocytes: 10 %
Neutrophils Absolute: 2.6 10*3/uL (ref 1.4–7.0)
Neutrophils: 51 %
Platelets: 260 10*3/uL (ref 150–450)
RBC: 4.39 x10E6/uL (ref 3.77–5.28)
RDW: 14.2 % (ref 11.7–15.4)
WBC: 5 10*3/uL (ref 3.4–10.8)

## 2023-01-18 LAB — LIPID PANEL
Chol/HDL Ratio: 3.4 ratio (ref 0.0–4.4)
Cholesterol, Total: 151 mg/dL (ref 100–199)
HDL: 44 mg/dL (ref >39)
LDL Chol Calc (NIH): 98 mg/dL (ref 0–99)
Triglycerides: 42 mg/dL (ref 0–149)
VLDL Cholesterol Cal: 9 mg/dL (ref 5–40)

## 2023-01-18 LAB — HEMOGLOBIN A1C
Est. average glucose Bld gHb Est-mCnc: 131 mg/dL
Hgb A1c MFr Bld: 6.2 % — ABNORMAL HIGH (ref 4.8–5.6)

## 2023-01-18 LAB — VITAMIN D 25 HYDROXY (VIT D DEFICIENCY, FRACTURES): Vit D, 25-Hydroxy: 17.6 ng/mL — ABNORMAL LOW (ref 30.0–100.0)

## 2023-01-18 LAB — TSH+FREE T4
Free T4: 1.18 ng/dL (ref 0.82–1.77)
TSH: 2.68 u[IU]/mL (ref 0.450–4.500)

## 2023-01-19 ENCOUNTER — Other Ambulatory Visit: Payer: Self-pay | Admitting: Family Medicine

## 2023-01-19 ENCOUNTER — Other Ambulatory Visit: Payer: Self-pay

## 2023-01-19 ENCOUNTER — Ambulatory Visit (HOSPITAL_COMMUNITY): Payer: 59 | Admitting: Certified Registered"

## 2023-01-19 ENCOUNTER — Encounter (HOSPITAL_COMMUNITY): Payer: Self-pay | Admitting: Obstetrics & Gynecology

## 2023-01-19 ENCOUNTER — Encounter (HOSPITAL_COMMUNITY)
Admission: AD | Disposition: A | Discharge status: Home / Self Care | Payer: Self-pay | Source: Home / Self Care | Attending: Obstetrics & Gynecology

## 2023-01-19 ENCOUNTER — Inpatient Hospital Stay (HOSPITAL_COMMUNITY)
Admission: AD | Admit: 2023-01-19 | Discharge: 2023-01-22 | DRG: 742 | Disposition: A | Discharge status: Home / Self Care | Payer: 59 | Attending: Obstetrics & Gynecology | Admitting: Obstetrics & Gynecology

## 2023-01-19 DIAGNOSIS — I471 Supraventricular tachycardia, unspecified: Secondary | ICD-10-CM | POA: Diagnosis not present

## 2023-01-19 DIAGNOSIS — N938 Other specified abnormal uterine and vaginal bleeding: Secondary | ICD-10-CM | POA: Diagnosis not present

## 2023-01-19 DIAGNOSIS — Z882 Allergy status to sulfonamides status: Secondary | ICD-10-CM

## 2023-01-19 DIAGNOSIS — D259 Leiomyoma of uterus, unspecified: Principal | ICD-10-CM | POA: Diagnosis present

## 2023-01-19 DIAGNOSIS — Z91048 Other nonmedicinal substance allergy status: Secondary | ICD-10-CM

## 2023-01-19 DIAGNOSIS — R338 Other retention of urine: Secondary | ICD-10-CM | POA: Diagnosis not present

## 2023-01-19 DIAGNOSIS — G4733 Obstructive sleep apnea (adult) (pediatric): Secondary | ICD-10-CM | POA: Diagnosis not present

## 2023-01-19 DIAGNOSIS — E119 Type 2 diabetes mellitus without complications: Secondary | ICD-10-CM | POA: Diagnosis present

## 2023-01-19 DIAGNOSIS — D251 Intramural leiomyoma of uterus: Secondary | ICD-10-CM | POA: Diagnosis not present

## 2023-01-19 DIAGNOSIS — Z90710 Acquired absence of both cervix and uterus: Principal | ICD-10-CM | POA: Diagnosis present

## 2023-01-19 DIAGNOSIS — Z833 Family history of diabetes mellitus: Secondary | ICD-10-CM

## 2023-01-19 DIAGNOSIS — I10 Essential (primary) hypertension: Secondary | ICD-10-CM | POA: Diagnosis present

## 2023-01-19 DIAGNOSIS — J45909 Unspecified asthma, uncomplicated: Secondary | ICD-10-CM | POA: Diagnosis present

## 2023-01-19 DIAGNOSIS — Z91013 Allergy to seafood: Secondary | ICD-10-CM

## 2023-01-19 DIAGNOSIS — Z6841 Body Mass Index (BMI) 40.0 and over, adult: Secondary | ICD-10-CM

## 2023-01-19 DIAGNOSIS — E559 Vitamin D deficiency, unspecified: Secondary | ICD-10-CM

## 2023-01-19 DIAGNOSIS — D219 Benign neoplasm of connective and other soft tissue, unspecified: Secondary | ICD-10-CM

## 2023-01-19 DIAGNOSIS — Z8249 Family history of ischemic heart disease and other diseases of the circulatory system: Secondary | ICD-10-CM

## 2023-01-19 DIAGNOSIS — N8500 Endometrial hyperplasia, unspecified: Secondary | ICD-10-CM | POA: Diagnosis not present

## 2023-01-19 DIAGNOSIS — N921 Excessive and frequent menstruation with irregular cycle: Secondary | ICD-10-CM | POA: Diagnosis not present

## 2023-01-19 HISTORY — PX: LAPAROSCOPY: SHX197

## 2023-01-19 HISTORY — PX: HYSTERECTOMY ABDOMINAL WITH SALPINGECTOMY: SHX6725

## 2023-01-19 LAB — GLUCOSE, CAPILLARY
Glucose-Capillary: 84 mg/dL (ref 70–99)
Glucose-Capillary: 132 mg/dL — ABNORMAL HIGH (ref 70–99)
Glucose-Capillary: 160 mg/dL — ABNORMAL HIGH (ref 70–99)

## 2023-01-19 SURGERY — LAPAROSCOPY, DIAGNOSTIC
Anesthesia: General | Site: Abdomen

## 2023-01-19 MED ORDER — OXYCODONE HCL 5 MG PO TABS
5.0000 mg | ORAL_TABLET | ORAL | Status: DC | PRN
  Administered 2023-01-19 – 2023-01-22 (×10): 10 mg via ORAL
  Filled 2023-01-19 (×10): qty 2

## 2023-01-19 MED ORDER — GABAPENTIN 100 MG PO CAPS: 100.0000 mg | ORAL_CAPSULE | Freq: Every day | ORAL | Status: DC | PRN

## 2023-01-19 MED ORDER — METFORMIN HCL 500 MG PO TABS
500.0000 mg | ORAL_TABLET | Freq: Two times a day (BID) | ORAL | Status: DC
  Administered 2023-01-20 – 2023-01-22 (×5): 500 mg via ORAL
  Filled 2023-01-19 (×5): qty 1

## 2023-01-19 MED ORDER — PROPOFOL 500 MG/50ML IV EMUL
INTRAVENOUS | Status: AC
  Filled 2023-01-19: qty 50

## 2023-01-19 MED ORDER — DEXAMETHASONE SODIUM PHOSPHATE 10 MG/ML IJ SOLN
INTRAMUSCULAR | Status: DC | PRN
  Administered 2023-01-19: 5 mg via INTRAVENOUS

## 2023-01-19 MED ORDER — CEFAZOLIN SODIUM 1 G IJ SOLR
INTRAMUSCULAR | Status: AC
  Filled 2023-01-19: qty 20

## 2023-01-19 MED ORDER — PROPOFOL 500 MG/50ML IV EMUL
INTRAVENOUS | Status: DC | PRN
  Administered 2023-01-19: 25 ug/kg/min via INTRAVENOUS

## 2023-01-19 MED ORDER — ALBUTEROL SULFATE (2.5 MG/3ML) 0.083% IN NEBU: 3.0000 mL | INHALATION_SOLUTION | Freq: Four times a day (QID) | RESPIRATORY_TRACT | Status: DC | PRN

## 2023-01-19 MED ORDER — LEVOFLOXACIN IN D5W 750 MG/150ML IV SOLN
750.0000 mg | Freq: Once | INTRAVENOUS | Status: AC
  Administered 2023-01-20: 750 mg via INTRAVENOUS
  Filled 2023-01-19: qty 150

## 2023-01-19 MED ORDER — AMLODIPINE BESYLATE 5 MG PO TABS
10.0000 mg | ORAL_TABLET | Freq: Every day | ORAL | Status: DC
  Administered 2023-01-20 – 2023-01-21 (×2): 10 mg via ORAL
  Filled 2023-01-19 (×3): qty 2

## 2023-01-19 MED ORDER — VASOPRESSIN 20 UNIT/ML IV SOLN
INTRAVENOUS | Status: DC | PRN
  Administered 2023-01-19: 45 mL via INTRAMUSCULAR

## 2023-01-19 MED ORDER — ONDANSETRON HCL 4 MG/2ML IJ SOLN: 4.0000 mg | Freq: Once | INTRAMUSCULAR | Status: DC | PRN

## 2023-01-19 MED ORDER — VITAMIN D (ERGOCALCIFEROL) 1.25 MG (50000 UNIT) PO CAPS: 50000.0000 [IU] | ORAL_CAPSULE | ORAL | 1 refills | Status: DC

## 2023-01-19 MED ORDER — FLUTICASONE FUROATE-VILANTEROL 200-25 MCG/ACT IN AEPB
1.0000 | INHALATION_SPRAY | Freq: Every day | RESPIRATORY_TRACT | Status: DC
  Administered 2023-01-20 – 2023-01-22 (×3): 1 via RESPIRATORY_TRACT
  Filled 2023-01-19: qty 28

## 2023-01-19 MED ORDER — KETOROLAC TROMETHAMINE 30 MG/ML IJ SOLN
30.0000 mg | Freq: Once | INTRAMUSCULAR | Status: AC
  Administered 2023-01-19: 30 mg via INTRAVENOUS

## 2023-01-19 MED ORDER — EPHEDRINE SULFATE-NACL 50-0.9 MG/10ML-% IV SOSY
PREFILLED_SYRINGE | INTRAVENOUS | Status: DC | PRN
Start: 2023-01-19 — End: 2023-01-19
  Administered 2023-01-19 (×2): 5 mg via INTRAVENOUS

## 2023-01-19 MED ORDER — LORATADINE 10 MG PO TABS
10.0000 mg | ORAL_TABLET | Freq: Every day | ORAL | Status: DC
  Administered 2023-01-19 – 2023-01-21 (×3): 10 mg via ORAL
  Filled 2023-01-19 (×3): qty 1

## 2023-01-19 MED ORDER — GLYCOPYRROLATE PF 0.2 MG/ML IJ SOSY
PREFILLED_SYRINGE | INTRAMUSCULAR | Status: DC | PRN
Start: 2023-01-19 — End: 2023-01-19
  Administered 2023-01-19: .2 mg via INTRAVENOUS

## 2023-01-19 MED ORDER — PHENYLEPHRINE 80 MCG/ML (10ML) SYRINGE FOR IV PUSH (FOR BLOOD PRESSURE SUPPORT)
PREFILLED_SYRINGE | INTRAVENOUS | Status: DC | PRN
  Administered 2023-01-19 (×5): 160 ug via INTRAVENOUS

## 2023-01-19 MED ORDER — EPHEDRINE 5 MG/ML INJ
INTRAVENOUS | Status: AC
  Filled 2023-01-19: qty 5

## 2023-01-19 MED ORDER — DOCUSATE SODIUM 100 MG PO CAPS
100.0000 mg | ORAL_CAPSULE | Freq: Two times a day (BID) | ORAL | Status: DC
  Administered 2023-01-19 – 2023-01-22 (×6): 100 mg via ORAL
  Filled 2023-01-19 (×6): qty 1

## 2023-01-19 MED ORDER — BISACODYL 10 MG RE SUPP: 10.0000 mg | Freq: Every day | RECTAL | Status: DC | PRN

## 2023-01-19 MED ORDER — KETAMINE HCL 50 MG/5ML IJ SOSY
PREFILLED_SYRINGE | INTRAMUSCULAR | Status: AC
  Filled 2023-01-19: qty 5

## 2023-01-19 MED ORDER — KCL IN DEXTROSE-NACL 20-5-0.45 MEQ/L-%-% IV SOLN: INTRAVENOUS | Status: DC

## 2023-01-19 MED ORDER — CEFAZOLIN SODIUM-DEXTROSE 1-4 GM/50ML-% IV SOLN
INTRAVENOUS | Status: AC
  Filled 2023-01-19: qty 50

## 2023-01-19 MED ORDER — LIDOCAINE 2% (20 MG/ML) 5 ML SYRINGE
INTRAMUSCULAR | Status: DC | PRN
  Administered 2023-01-19: 100 mg via INTRAVENOUS

## 2023-01-19 MED ORDER — DEXMEDETOMIDINE HCL IN NACL 80 MCG/20ML IV SOLN
INTRAVENOUS | Status: DC | PRN
  Administered 2023-01-19 (×2): 8 ug via INTRAVENOUS

## 2023-01-19 MED ORDER — MIDAZOLAM HCL 2 MG/2ML IJ SOLN
INTRAMUSCULAR | Status: DC | PRN
  Administered 2023-01-19: 2 mg via INTRAVENOUS

## 2023-01-19 MED ORDER — 0.9 % SODIUM CHLORIDE (POUR BTL) OPTIME
TOPICAL | Status: DC | PRN
Start: 2023-01-19 — End: 2023-01-19
  Administered 2023-01-19: 2000 mL

## 2023-01-19 MED ORDER — LIDOCAINE HCL (PF) 2 % IJ SOLN
INTRAMUSCULAR | Status: AC
  Filled 2023-01-19: qty 5

## 2023-01-19 MED ORDER — BUPIVACAINE-MELOXICAM ER 200-6 MG/7ML IJ SOLN
INTRAMUSCULAR | Status: AC
  Filled 2023-01-19: qty 1

## 2023-01-19 MED ORDER — FENTANYL CITRATE (PF) 250 MCG/5ML IJ SOLN
INTRAMUSCULAR | Status: AC
  Filled 2023-01-19: qty 5

## 2023-01-19 MED ORDER — FENTANYL CITRATE PF 50 MCG/ML IJ SOSY
25.0000 ug | PREFILLED_SYRINGE | INTRAMUSCULAR | Status: DC | PRN
  Administered 2023-01-19: 50 ug via INTRAVENOUS
  Filled 2023-01-19: qty 1

## 2023-01-19 MED ORDER — KETAMINE HCL 10 MG/ML IJ SOLN
INTRAMUSCULAR | Status: DC | PRN
Start: 2023-01-19 — End: 2023-01-19
  Administered 2023-01-19: 25 mg via INTRAVENOUS

## 2023-01-19 MED ORDER — ONDANSETRON HCL 4 MG PO TABS
4.0000 mg | ORAL_TABLET | Freq: Four times a day (QID) | ORAL | Status: DC | PRN
  Administered 2023-01-20 – 2023-01-22 (×3): 4 mg via ORAL
  Filled 2023-01-19 (×3): qty 1

## 2023-01-19 MED ORDER — SCOPOLAMINE 1 MG/3DAYS TD PT72
MEDICATED_PATCH | TRANSDERMAL | Status: AC
  Filled 2023-01-19: qty 1

## 2023-01-19 MED ORDER — GLYCOPYRROLATE PF 0.2 MG/ML IJ SOSY
PREFILLED_SYRINGE | INTRAMUSCULAR | Status: AC
  Filled 2023-01-19: qty 1

## 2023-01-19 MED ORDER — FENTANYL CITRATE (PF) 250 MCG/5ML IJ SOLN
INTRAMUSCULAR | Status: DC | PRN
  Administered 2023-01-19 (×3): 50 ug via INTRAVENOUS

## 2023-01-19 MED ORDER — CHLORHEXIDINE GLUCONATE 0.12 % MT SOLN
15.0000 mL | Freq: Once | OROMUCOSAL | Status: AC
  Administered 2023-01-19: 15 mL via OROMUCOSAL

## 2023-01-19 MED ORDER — KETOROLAC TROMETHAMINE 30 MG/ML IJ SOLN
INTRAMUSCULAR | Status: AC
  Filled 2023-01-19: qty 1

## 2023-01-19 MED ORDER — SCOPOLAMINE 1 MG/3DAYS TD PT72
1.0000 | MEDICATED_PATCH | Freq: Once | TRANSDERMAL | Status: DC
  Administered 2023-01-19: 1.5 mg via TRANSDERMAL

## 2023-01-19 MED ORDER — SUGAMMADEX SODIUM 200 MG/2ML IV SOLN
INTRAVENOUS | Status: DC | PRN
  Administered 2023-01-19: 400 mg via INTRAVENOUS

## 2023-01-19 MED ORDER — CHLORHEXIDINE GLUCONATE 0.12 % MT SOLN
OROMUCOSAL | Status: AC
  Filled 2023-01-19: qty 15

## 2023-01-19 MED ORDER — ROCURONIUM BROMIDE 100 MG/10ML IV SOLN
INTRAVENOUS | Status: DC | PRN
  Administered 2023-01-19: 10 mg via INTRAVENOUS
  Administered 2023-01-19: 40 mg via INTRAVENOUS
  Administered 2023-01-19: 20 mg via INTRAVENOUS
  Administered 2023-01-19: 60 mg via INTRAVENOUS
  Administered 2023-01-19 (×2): 10 mg via INTRAVENOUS
  Administered 2023-01-19: 30 mg via INTRAVENOUS

## 2023-01-19 MED ORDER — PROPOFOL 10 MG/ML IV BOLUS
INTRAVENOUS | Status: DC | PRN
  Administered 2023-01-19: 200 mg via INTRAVENOUS

## 2023-01-19 MED ORDER — OXYCODONE HCL 5 MG PO TABS: 5.0000 mg | ORAL_TABLET | Freq: Once | ORAL | Status: DC | PRN

## 2023-01-19 MED ORDER — KETOROLAC TROMETHAMINE 30 MG/ML IJ SOLN
30.0000 mg | Freq: Four times a day (QID) | INTRAMUSCULAR | Status: AC
  Administered 2023-01-19 – 2023-01-20 (×4): 30 mg via INTRAVENOUS
  Filled 2023-01-19 (×4): qty 1

## 2023-01-19 MED ORDER — SENNOSIDES-DOCUSATE SODIUM 8.6-50 MG PO TABS
1.0000 | ORAL_TABLET | Freq: Every evening | ORAL | Status: DC | PRN
  Administered 2023-01-21: 1 via ORAL
  Filled 2023-01-19: qty 1

## 2023-01-19 MED ORDER — CEFAZOLIN SODIUM-DEXTROSE 2-4 GM/100ML-% IV SOLN
2.0000 g | INTRAVENOUS | Status: AC
  Administered 2023-01-19 (×2): 3 g via INTRAVENOUS

## 2023-01-19 MED ORDER — ACETAMINOPHEN 10 MG/ML IV SOLN
INTRAVENOUS | Status: DC | PRN
Start: 2023-01-19 — End: 2023-01-19
  Administered 2023-01-19: 1000 mg via INTRAVENOUS

## 2023-01-19 MED ORDER — LEVOCETIRIZINE DIHYDROCHLORIDE 5 MG PO TABS: 5.0000 mg | ORAL_TABLET | Freq: Every evening | ORAL | Status: DC

## 2023-01-19 MED ORDER — ZOLPIDEM TARTRATE 5 MG PO TABS: 5.0000 mg | ORAL_TABLET | Freq: Every evening | ORAL | Status: DC | PRN

## 2023-01-19 MED ORDER — ROCURONIUM BROMIDE 10 MG/ML (PF) SYRINGE
PREFILLED_SYRINGE | INTRAVENOUS | Status: AC
  Filled 2023-01-19: qty 10

## 2023-01-19 MED ORDER — IRBESARTAN 150 MG PO TABS
150.0000 mg | ORAL_TABLET | Freq: Every day | ORAL | Status: DC
  Administered 2023-01-20 – 2023-01-22 (×3): 150 mg via ORAL
  Filled 2023-01-19 (×3): qty 1

## 2023-01-19 MED ORDER — VASOPRESSIN 20 UNIT/ML IV SOLN
INTRAVENOUS | Status: AC
  Filled 2023-01-19: qty 1

## 2023-01-19 MED ORDER — POVIDONE-IODINE 10 % EX SWAB: 2.0000 | Freq: Once | CUTANEOUS | Status: DC

## 2023-01-19 MED ORDER — ACETAMINOPHEN 10 MG/ML IV SOLN
INTRAVENOUS | Status: AC
  Filled 2023-01-19: qty 100

## 2023-01-19 MED ORDER — PROPOFOL 10 MG/ML IV BOLUS
INTRAVENOUS | Status: AC
  Filled 2023-01-19: qty 20

## 2023-01-19 MED ORDER — CEFAZOLIN IN SODIUM CHLORIDE 3-0.9 GM/100ML-% IV SOLN
INTRAVENOUS | Status: AC
  Filled 2023-01-19: qty 100

## 2023-01-19 MED ORDER — FENTANYL CITRATE PF 50 MCG/ML IJ SOSY
50.0000 ug | PREFILLED_SYRINGE | INTRAMUSCULAR | Status: DC | PRN
  Administered 2023-01-20: 50 ug via INTRAVENOUS
  Filled 2023-01-19: qty 2

## 2023-01-19 MED ORDER — OXYCODONE HCL 5 MG/5ML PO SOLN: 5.0000 mg | Freq: Once | ORAL | Status: DC | PRN

## 2023-01-19 MED ORDER — PHENYLEPHRINE 80 MCG/ML (10ML) SYRINGE FOR IV PUSH (FOR BLOOD PRESSURE SUPPORT)
PREFILLED_SYRINGE | INTRAVENOUS | Status: AC
  Filled 2023-01-19: qty 10

## 2023-01-19 MED ORDER — STERILE WATER FOR IRRIGATION IR SOLN
Status: DC | PRN
  Administered 2023-01-19: 1000 mL

## 2023-01-19 MED ORDER — MIDAZOLAM HCL 2 MG/2ML IJ SOLN
INTRAMUSCULAR | Status: AC
  Filled 2023-01-19: qty 2

## 2023-01-19 MED ORDER — VITAMIN D (ERGOCALCIFEROL) 1.25 MG (50000 UNIT) PO CAPS: 50000.0000 [IU] | ORAL_CAPSULE | ORAL | Status: DC

## 2023-01-19 MED ORDER — ONDANSETRON HCL 4 MG/2ML IJ SOLN
INTRAMUSCULAR | Status: DC | PRN
  Administered 2023-01-19: 4 mg via INTRAVENOUS

## 2023-01-19 MED ORDER — AMLODIPINE-OLMESARTAN 10-20 MG PO TABS: 1.0000 | ORAL_TABLET | Freq: Every day | ORAL | Status: DC

## 2023-01-19 MED ORDER — ENOXAPARIN SODIUM 80 MG/0.8ML IJ SOSY
80.0000 mg | PREFILLED_SYRINGE | INTRAMUSCULAR | Status: DC
  Administered 2023-01-20 – 2023-01-22 (×3): 80 mg via SUBCUTANEOUS
  Filled 2023-01-19 (×3): qty 0.8

## 2023-01-19 MED ORDER — ONDANSETRON HCL 4 MG/2ML IJ SOLN
INTRAMUSCULAR | Status: AC
  Filled 2023-01-19: qty 2

## 2023-01-19 MED ORDER — SUMATRIPTAN SUCCINATE 50 MG PO TABS
100.0000 mg | ORAL_TABLET | ORAL | Status: DC | PRN
  Administered 2023-01-21: 100 mg via ORAL
  Filled 2023-01-19: qty 2

## 2023-01-19 MED ORDER — BUPIVACAINE-MELOXICAM ER 200-6 MG/7ML IJ SOLN
INTRAMUSCULAR | Status: DC | PRN
Start: 2023-01-19 — End: 2023-01-19
  Administered 2023-01-19: 200 mg

## 2023-01-19 MED ORDER — BUPIVACAINE HCL (PF) 0.25 % IJ SOLN
INTRAMUSCULAR | Status: AC
  Filled 2023-01-19: qty 60

## 2023-01-19 MED ORDER — SODIUM CHLORIDE 0.9 % IV SOLN: 8.0000 mg | Freq: Four times a day (QID) | INTRAVENOUS | Status: DC | PRN

## 2023-01-19 MED ORDER — MONTELUKAST SODIUM 10 MG PO TABS
10.0000 mg | ORAL_TABLET | Freq: Every day | ORAL | Status: DC
  Administered 2023-01-20 – 2023-01-21 (×2): 10 mg via ORAL
  Filled 2023-01-19 (×2): qty 1

## 2023-01-19 MED ORDER — ORAL CARE MOUTH RINSE: 15.0000 mL | Freq: Once | OROMUCOSAL | Status: AC

## 2023-01-19 MED ORDER — ALUM & MAG HYDROXIDE-SIMETH 200-200-20 MG/5ML PO SUSP: 30.0000 mL | ORAL | Status: DC | PRN

## 2023-01-19 MED ORDER — IBUPROFEN 600 MG PO TABS
600.0000 mg | ORAL_TABLET | Freq: Four times a day (QID) | ORAL | Status: DC
  Administered 2023-01-20 – 2023-01-22 (×7): 600 mg via ORAL
  Filled 2023-01-19 (×8): qty 1

## 2023-01-19 MED ORDER — FAMOTIDINE 20 MG PO TABS: 20.0000 mg | ORAL_TABLET | Freq: Two times a day (BID) | ORAL | Status: DC | PRN

## 2023-01-19 MED ORDER — LACTATED RINGERS IV SOLN: INTRAVENOUS | Status: DC

## 2023-01-19 SURGICAL SUPPLY — 83 items
ADH SKN CLS APL DERMABOND .7 (GAUZE/BANDAGES/DRESSINGS) ×2
ANTIFOG SOL W/FOAM PAD STRL (MISCELLANEOUS) ×2
APL SWBSTK 6 STRL LF DISP (MISCELLANEOUS) ×2
APPLICATOR COTTON TIP 6 STRL (MISCELLANEOUS) IMPLANT
APPLICATOR COTTON TIP 6IN STRL (MISCELLANEOUS) ×2
BAG DECANTER FOR FLEXI CONT (MISCELLANEOUS) IMPLANT
BAG DRN RND TRDRP ANRFLXCHMBR (UROLOGICAL SUPPLIES) ×2
BAG URINE DRAIN 2000ML AR STRL (UROLOGICAL SUPPLIES) IMPLANT
BLADE SURG SZ10 CARB STEEL (BLADE) IMPLANT
BLADE SURG SZ11 CARB STEEL (BLADE) ×2 IMPLANT
CATH FOLEY LATEX FREE 16FR (CATHETERS) ×2
CATH FOLEY LF 16FR (CATHETERS) IMPLANT
COUNTER NDL 20CT MAGNET RED (NEEDLE) IMPLANT
COVER MAYO STAND XLG (MISCELLANEOUS) ×2 IMPLANT
COVER TIP SHEARS 8 DVNC (MISCELLANEOUS) ×2 IMPLANT
DERMABOND ADVANCED .7 DNX12 (GAUZE/BANDAGES/DRESSINGS) ×2 IMPLANT
DRAPE ARM DVNC X/XI (DISPOSABLE) ×8 IMPLANT
DRAPE HALF SHEET 40X57 (DRAPES) ×2 IMPLANT
DRIVER NDL MEGA SUTCUT DVNCXI (INSTRUMENTS) ×2 IMPLANT
DRIVER NDLE MEGA SUTCUT DVNCXI (INSTRUMENTS) ×2
DRSG OPSITE POSTOP 4X10 (GAUZE/BANDAGES/DRESSINGS) IMPLANT
ELECT REM PT RETURN 9FT ADLT (ELECTROSURGICAL) ×2
ELECTRODE REM PT RTRN 9FT ADLT (ELECTROSURGICAL) ×2 IMPLANT
GAUZE 4X4 16PLY ~~LOC~~+RFID DBL (SPONGE) ×4 IMPLANT
GLOVE BIOGEL PI IND STRL 7.0 (GLOVE) ×6 IMPLANT
GLOVE BIOGEL PI IND STRL 8 (GLOVE) ×6 IMPLANT
GLOVE ECLIPSE 8.0 STRL XLNG CF (GLOVE) ×6 IMPLANT
GOWN STRL REUS W/TWL LRG LVL3 (GOWN DISPOSABLE) ×4 IMPLANT
GOWN STRL REUS W/TWL XL LVL3 (GOWN DISPOSABLE) ×4 IMPLANT
GYRUS RUMI II 3.5CM BLUE (DISPOSABLE) ×2
IRRIGATION STRYKERFLOW (MISCELLANEOUS) IMPLANT
IRRIGATOR STRYKERFLOW (MISCELLANEOUS)
IRRIGATOR SUCT 8 DISP DVNC XI (IRRIGATION / IRRIGATOR) IMPLANT
IV NS IRRIG 3000ML ARTHROMATIC (IV SOLUTION) IMPLANT
KIT PINK PAD W/HEAD ARE REST (MISCELLANEOUS) ×2
KIT PINK PAD W/HEAD ARM REST (MISCELLANEOUS) ×2 IMPLANT
KIT TURNOVER CYSTO (KITS) ×2 IMPLANT
MANIFOLD NEPTUNE II (INSTRUMENTS) ×2 IMPLANT
MANIPULATOR VCARE LG CRV RETR (MISCELLANEOUS) IMPLANT
MANIPULATOR VCARE SML CRV RETR (MISCELLANEOUS) IMPLANT
MANIPULATOR VCARE STD CRV RETR (MISCELLANEOUS) IMPLANT
NDL HYPO 18GX1.5 BLUNT FILL (NEEDLE) IMPLANT
NDL HYPO 21X1.5 SAFETY (NEEDLE) ×2 IMPLANT
NDL INSUFFLATION 14GA 120MM (NEEDLE) ×2 IMPLANT
NDL SPNL 18GX3.5 QUINCKE PK (NEEDLE) IMPLANT
NEEDLE HYPO 18GX1.5 BLUNT FILL (NEEDLE) ×2
NEEDLE HYPO 21X1.5 SAFETY (NEEDLE) ×2
NEEDLE INSUFFLATION 14GA 120MM (NEEDLE) ×2
NEEDLE SPNL 18GX3.5 QUINCKE PK (NEEDLE) ×2
OBTURATOR OPTICAL STND 8 DVNC (TROCAR) ×2
OBTURATOR OPTICALSTD 8 DVNC (TROCAR) ×2 IMPLANT
OCCLUDER COLPOPNEUMO (BALLOONS) ×2 IMPLANT
PACK PERI GYN (CUSTOM PROCEDURE TRAY) ×2 IMPLANT
POSITIONER HEAD 8X9X4 ADT (SOFTGOODS) ×2 IMPLANT
POWDER SURGICEL 3.0 GRAM (HEMOSTASIS) IMPLANT
RETRACTOR TRAXI PANNICULUS (MISCELLANEOUS) IMPLANT
RETRACTOR WND ALEXIS-O 25 LRG (MISCELLANEOUS) IMPLANT
RTRCTR WOUND ALEXIS O 25CM LRG (MISCELLANEOUS) ×2
RUMI II GYRUS 3.5CM BLUE (DISPOSABLE) IMPLANT
SCISSORS MNPLR CVD DVNC XI (INSTRUMENTS) ×2 IMPLANT
SEAL UNIV 5-12 XI (MISCELLANEOUS) ×8 IMPLANT
SEALER VESSEL EXT DVNC XI (MISCELLANEOUS) ×2 IMPLANT
SET BASIN LINEN APH (SET/KITS/TRAYS/PACK) ×2 IMPLANT
SET TUBE SMOKE EVAC HIGH FLOW (TUBING) ×2 IMPLANT
SOLUTION ANTFG W/FOAM PAD STRL (MISCELLANEOUS) ×2 IMPLANT
SPONGE T-LAP 18X18 ~~LOC~~+RFID (SPONGE) ×2 IMPLANT
STAPLER VISISTAT 35W (STAPLE) IMPLANT
SUT PDS AB 0 CTX 60 (SUTURE) IMPLANT
SUT STRATAFIX 0 PDS+ CT-2 23 (SUTURE) ×2
SUT VIC AB 0 CT1 27 (SUTURE) ×2
SUT VIC AB 0 CT1 27XCR 8 STRN (SUTURE) IMPLANT
SUT VIC AB 0 CTX 36 (SUTURE) ×2
SUT VIC AB 0 CTX36XBRD ANTBCTR (SUTURE) IMPLANT
SUT VIC AB 3-0 SH 27 (SUTURE) ×6
SUT VIC AB 3-0 SH 27X BRD (SUTURE) ×2 IMPLANT
SUT VICRYL 0 UR6 27IN ABS (SUTURE) ×2 IMPLANT
SUTURE STRATFX 0 PDS+ CT-2 23 (SUTURE) ×2 IMPLANT
SYR 10ML LL (SYRINGE) ×4 IMPLANT
SYR 20ML LL LF (SYRINGE) ×2 IMPLANT
TAPE TRANSPORE STRL 2 31045 (GAUZE/BANDAGES/DRESSINGS) ×2 IMPLANT
TIP RUMI ORANGE 6.7MMX12CM (TIP) IMPLANT
TOWEL OR 17X26 4PK STRL BLUE (TOWEL DISPOSABLE) IMPLANT
WATER STERILE IRR 500ML POUR (IV SOLUTION) ×2 IMPLANT

## 2023-01-19 NOTE — Op Note (Signed)
Preoperative diagnosis: Enlarged fibroid uterus, greater than 1000 cc and sonogram Dysfunctional uterine bleeding unresponsive to all conservative measures including ablation, double Mirena IUDs, and chronic megestrol therapy Morbid obesity  Postoperative diagnosis: Same as above  Procedure: Diagnostic laparoscopy which prompted me to abandon the planned robotic assisted total laparoscopic hysterectomy(I did not hook the robot up I made the decision prior to that that it was ill-advised to proceed with a robotic hysterectomy)  Total abdominal hysterectomy with bilateral salpingectomy  Surgeon: Lazaro Arms, MD  Anesthesia: General Endotracheal  Findings: Patient was known preoperatively to have an enlarged fibroid uterus It was mobile on exam but as it turns out I think I was actually moving her abdominal wall and not the uterus itself The uterus was filling the entire abdomen sidewall to sidewall and there was no lateral room at all to get to the pedicles I could get on the anterior surface of the uterus but could not find a safe way to get to the round ligaments  I then proceeded with a total abdominal hysterectomy She had multiple fibroids of course 1 large anterior fibroid that went abdominal wall to abdominal wall that was the problem for the robotic approach  Description of operation: Patient was taken to the operating room where she was placed in the supine position She underwent general endotracheal anesthesia She was prepped and draped in the usual sterile fashion in preparation for robotic assisted total laparoscopic hysterectomy  I placed a Rumi 2 uterine manipulator with a 12 cm intrauterine attachment and a 4.0 cm cervical cup  Foley catheter was placed  I then turned my attention to the robotic portion with placement of trocars  I made an incision above the umbilicus and grasped the fascia and placed a Veress needle into the peritoneal cavity difficulty The  peritoneal cavity was insufflated to a pressure of 15 I then placed a 8 mm trocar into the peritoneal cavity of 1 pass slight difficulty under direct video laparoscope guidance I was barely able to make it into the peritoneal cavity with the instruments I then placed right and left lateral instrumentation 8 mm ports under direct visualization without difficulty I evaluated the uterus for about 15 minutes to see if I could get any lateral traction I used laparoscopic instruments to do the evaluation prior to docking the robot I was not able to get to the round ligaments safely which would have given me entry into the uterine vasculature The anterior myoma was just too broad and too hard and I I thought it appropriate to proceed and transition to a abdominal approach  I closed all 3 laparoscopic incisions without difficulty  The patient was then repositioned and reprepped for abdominal procedure    Description of operation:    A Pfannenstiel skin incision was made and carried down sharply to the rectus fascia which was scored in the midline and extended laterally.  The fascia was taken off the muscles superiorly and inferiorly without difficulty.  The muscles were divided.  The peritoneal cavity was entered.  An large Alexis self-retaining retractor was placed.  The upper abdomen was packed away. Both uterine cornu were grasped with Coker clamps. I injected a vasopressin solution at the base of the anterior myoma, 20 mg of vasopressin and 100 cc of normal saline and I injected all 100 cc I then performed a my ectomy in order to facilitate the hysterectomy There was good hemostasis using this technique  the left round ligament was  suture ligated and coagulated with the electrocautery unit.  The left vesicouterine serosal flap was created.  An avascular window in in the peritoneum was created and the utero-ovarian ligament was cross clamped, cut and suture ligated.  The right round ligament was suture  ligated and cut with the electrocautery unit.  The vesicouterine serosal flap on the right was created.  An avascular window in the peritoneum was created and the right utero-ovarian ligament was cross clamped, cut and double suture ligated.  Thus both ovaries were preserved.  The uterine vessels were skeletonized bilaterally.  The uterine vessels were clamped bilaterally,  then cut and suture ligated.  Two more pedicles were taken down the cervix medial to the uterine vessels.  Each pedicle was clamped cut and suture ligated with good resulting hemostasis.   The vagina was crossclamped and the specimen removed Vaginal angle sutures were placed bilaterally The vagina was closed using 0 Vicryl in an interrupted fashion  the pelvis was irrigated vigorously and all pedicles were examined and found to be hemostatic.  Both Fallpoian tubes were removed by cross clamping with a Kelley clamp and a fore and aft 3-0 monocryl suture for hemostasis.  All specimens were sent to pathology for routine evaluation.  The Alexis self-retaining retractor was removed and the pelvis was irrigated vigorously.  All packs were removed and all counts were correct at this point x 3.  The muscles and peritoneum were reapproximated loosely.  The fascia was closed with 0 PDS looped running.  The subcutaneous tissue was reapproximated using 2-0 plain gut.  The skin was closed using skin staples  the patient was awakened from anesthesia taken to the recovery room in good stable condition. All sponge instrument and needle counts were correct x 3.  The patient received Ancef and Toradol prophylactically preoperatively.  Estimated blood loss for the procedure was 250  cc.    Lazaro Arms, MD 01/20/2023 7:40 AM

## 2023-01-19 NOTE — Anesthesia Procedure Notes (Addendum)
Procedure Name: Intubation Date/Time: 01/19/2023 11:09 AM  Performed by: Julian Reil, CRNAPre-anesthesia Checklist: Patient identified, Emergency Drugs available, Suction available and Patient being monitored Patient Re-evaluated:Patient Re-evaluated prior to induction Oxygen Delivery Method: Circle system utilized Preoxygenation: Pre-oxygenation with 100% oxygen Induction Type: IV induction Ventilation: Mask ventilation without difficulty and Oral airway inserted - appropriate to patient size Laryngoscope Size: Mac and 4 Grade View: Grade II Tube type: Oral Tube size: 7.5 mm Number of attempts: 1 Airway Equipment and Method: Stylet and Oral airway Placement Confirmation: ETT inserted through vocal cords under direct vision, positive ETCO2 and breath sounds checked- equal and bilateral Secured at: 22 cm Tube secured with: Tape Dental Injury: Teeth and Oropharynx as per pre-operative assessment

## 2023-01-19 NOTE — Anesthesia Preprocedure Evaluation (Signed)
Anesthesia Evaluation  Patient identified by MRN, date of birth, ID band Patient awake    Reviewed: Allergy & Precautions, H&P , NPO status , Patient's Chart, lab work & pertinent test results, reviewed documented beta blocker date and time   Airway Mallampati: II  TM Distance: >3 FB Neck ROM: full    Dental no notable dental hx.    Pulmonary neg pulmonary ROS, shortness of breath, asthma , sleep apnea    Pulmonary exam normal breath sounds clear to auscultation       Cardiovascular Exercise Tolerance: Good hypertension, negative cardio ROS + dysrhythmias  Rhythm:regular Rate:Normal     Neuro/Psych  Headaches  Anxiety      Neuromuscular disease negative neurological ROS  negative psych ROS   GI/Hepatic negative GI ROS, Neg liver ROS,GERD  ,,  Endo/Other  diabetes  Morbid obesity  Renal/GU negative Renal ROS  negative genitourinary   Musculoskeletal   Abdominal   Peds  Hematology negative hematology ROS (+) Blood dyscrasia, anemia   Anesthesia Other Findings   Reproductive/Obstetrics negative OB ROS                             Anesthesia Physical Anesthesia Plan  ASA: 4  Anesthesia Plan: General and General ETT   Post-op Pain Management:    Induction:   PONV Risk Score and Plan: Scopolamine patch - Pre-op and Ondansetron  Airway Management Planned:   Additional Equipment:   Intra-op Plan:   Post-operative Plan:   Informed Consent: I have reviewed the patients History and Physical, chart, labs and discussed the procedure including the risks, benefits and alternatives for the proposed anesthesia with the patient or authorized representative who has indicated his/her understanding and acceptance.     Dental Advisory Given  Plan Discussed with: CRNA  Anesthesia Plan Comments:        Anesthesia Quick Evaluation

## 2023-01-19 NOTE — Transfer of Care (Signed)
Immediate Anesthesia Transfer of Care Note  Patient: Jessica Sanchez  Procedure(s) Performed: LAPAROSCOPY DIAGNOSTIC HYSTERECTOMY ABDOMINAL WITH SALPINGECTOMY (Bilateral: Abdomen)  Patient Location: PACU  Anesthesia Type:General  Level of Consciousness: awake and alert   Airway & Oxygen Therapy: Patient Spontanous Breathing and Patient connected to face mask oxygen  Post-op Assessment: Report given to RN and Post -op Vital signs reviewed and stable  Post vital signs: Reviewed and stable  Last Vitals:  Vitals Value Taken Time  BP 118/91   Temp 97.6   Pulse 89   Resp 17 01/19/23 1553  SpO2 97%   Vitals shown include unfiled device data.  Last Pain:  Vitals:   01/19/23 0941  TempSrc: Oral  PainSc: 0-No pain      Patients Stated Pain Goal: 8 (01/19/23 0941)  Complications: No notable events documented.

## 2023-01-19 NOTE — H&P (Signed)
Preoperative History and Physical  Jessica Sanchez is a 37 y.o. G1P0010 with Patient's last menstrual period was 12/26/2022 (approximate). admitted for a RA TLH + BS.  Really HMB DUB has been a lifelong issue We have done everything we can conservativley including 2 IUDs, endometrial ablation, distant history of myomectomy along with megestrol + other progesterone only methods all with limited success As a result we are proceeding with a RA TLH BS, pt has been quite patoient and has really tried everything else, she wanted this some time ago Her uterus is enlarged likely with fibroids as well  PMH:    Past Medical History:  Diagnosis Date   Angio-edema    Anxiety    Arthritis    right shoulder   Asthma    Diabetes mellitus without complication (HCC)    Dyspnea    Dysrhythmia    hx of SVT   Fibroid (bleeding) (uterine)    Fibroid, uterine    H/O metrorrhagia    Hypertension    IUD (intrauterine device) in place 07/11/2015   Menorrhagia    Obesity, Class III, BMI 40-49.9 (morbid obesity) (HCC) 11/11/2018   Sleep apnea    uses CPAP - setting 11   SVT (supraventricular tachycardia)     PSH:     Past Surgical History:  Procedure Laterality Date   DILATATION AND CURETTAGE/HYSTEROSCOPY WITH MINERVA N/A 12/01/2021   Procedure: DILATATION AND CURETTAGE/HYSTEROSCOPY WITH MINERVA;  Surgeon: Lazaro Arms, MD;  Location: [facility];  Service: Gynecology;  Laterality: N/A;   HYSTEROSCOPY N/A 09/17/2020   Procedure: HYSTEROSCOPY;  Surgeon: Lazaro Arms, MD;  Location: [facility];  Service: Gynecology;  Laterality: N/A;   HYSTEROSCOPY WITH D & C N/A 09/27/2013   Procedure: DILATATION AND CURETTAGE /HYSTEROSCOPY and insertion of Mirena IUD ;  Surgeon: Freddrick March. Tenny Craw, MD;  Location: [facility];  Service: Gynecology;  Laterality: N/A;   IUD REMOVAL N/A 09/17/2020   Procedure: INTRAUTERINE DEVICE (IUD) REMOVAL (2);  Surgeon: Lazaro Arms, MD;  Location: [facility];  Service: Gynecology;  Laterality: N/A;    MYOMECTOMY  02/03/2011   Procedure: MYOMECTOMY;  Surgeon: Lazaro Arms, MD;  Location: [facility];  Service: Gynecology;  Laterality: N/A;   SVT ABLATION N/A 11/08/2018   Procedure: SVT ABLATION;  Surgeon: Marinus Maw, MD;  Location: [facility] INVASIVE CV LAB;  Service: Cardiovascular;  Laterality: N/A;   SVT ABLATION N/A 12/26/2020   Procedure: SVT ABLATION;  Surgeon: Marinus Maw, MD;  Location: [facility] INVASIVE CV LAB;  Service: Cardiovascular;  Laterality: N/A;   TONSILLECTOMY     TYMPANOSTOMY TUBE PLACEMENT      POb/GynH:      OB History     Gravida  1   Para      Term      Preterm      AB  1   Living         SAB  1   IAB      Ectopic      Multiple      Live Births              SH:   Social History   Tobacco Use   Smoking status: Never   Smokeless tobacco: Never   Tobacco comments:    socially  Vaping Use   Vaping status: Never Used  Substance Use Topics   Alcohol use: Yes    Comment: occasional   Drug use: Yes  Types: Marijuana    Comment: on occ    FH:    Family History  Problem Relation Age of Onset   Cirrhosis Mother    Diabetes Mother    Cancer Maternal Grandfather    Hypertension Sister    Diabetes Sister    Anesthesia problems Neg Hx    Hypotension Neg Hx    Malignant hyperthermia Neg Hx    Pseudochol deficiency Neg Hx      Allergies:  Allergies  Allergen Reactions   Shellfish Allergy Shortness Of Breath, Itching and Swelling    Mouth became swollen, throat started itching, and developed shortness of breath   Sulfa Antibiotics Itching, Shortness Of Breath and Swelling    Mouth became swollen, throat started itching, and developed shortness of breath   Adhesive [Tape] Itching    Depends on the type of medical tape    Medications:       Current Facility-Administered Medications:    ceFAZolin (ANCEF) 3-0.9 GM/100ML-% IVPB, , , ,    ceFAZolin (ANCEF) IVPB 2g/100 mL premix, 2 g, Intravenous, On Call to OR, Lazaro Arms,  MD   chlorhexidine (PERIDEX) 0.12 % solution, , , ,    ketorolac (TORADOL) 30 MG/ML injection 30 mg, 30 mg, Intravenous, Once, Lazaro Arms, MD   ketorolac (TORADOL) 30 MG/ML injection, , , ,    povidone-iodine 10 % swab 2 Application, 2 Application, Topical, Once, Lazaro Arms, MD  Review of Systems:   Review of Systems  Constitutional: Negative for fever, chills, weight loss, malaise/fatigue and diaphoresis.  HENT: Negative for hearing loss, ear pain, nosebleeds, congestion, sore throat, neck pain, tinnitus and ear discharge.   Eyes: Negative for blurred vision, double vision, photophobia, pain, discharge and redness.  Respiratory: Negative for cough, hemoptysis, sputum production, shortness of breath, wheezing and stridor.   Cardiovascular: Negative for chest pain, palpitations, orthopnea, claudication, leg swelling and PND.  Gastrointestinal: Positive for abdominal pain. Negative for heartburn, nausea, vomiting, diarrhea, constipation, blood in stool and melena.  Genitourinary: Negative for dysuria, urgency, frequency, hematuria and flank pain.  Musculoskeletal: Negative for myalgias, back pain, joint pain and falls.  Skin: Negative for itching and rash.  Neurological: Negative for dizziness, tingling, tremors, sensory change, speech change, focal weakness, seizures, loss of consciousness, weakness and headaches.  Endo/Heme/Allergies: Negative for environmental allergies and polydipsia. Does not bruise/bleed easily.  Psychiatric/Behavioral: Negative for depression, suicidal ideas, hallucinations, memory loss and substance abuse. The patient is not nervous/anxious and does not have insomnia.      PHYSICAL EXAM:  Blood pressure 132/82, pulse 79, temperature 97.8 F (36.6 C), temperature source Oral, resp. rate 18, height 5\' 2"  (1.575 m), weight (!) 160.1 kg, last menstrual period 12/26/2022, SpO2 99%.    Vitals reviewed. Constitutional: She is oriented to person, place, and time.  She appears well-developed and well-nourished.  HENT:  Head: Normocephalic and atraumatic.  Right Ear: External ear normal.  Left Ear: External ear normal.  Nose: Nose normal.  Mouth/Throat: Oropharynx is clear and moist.  Eyes: Conjunctivae and EOM are normal. Pupils are equal, round, and reactive to light. Right eye exhibits no discharge. Left eye exhibits no discharge. No scleral icterus.  Neck: Normal range of motion. Neck supple. No tracheal deviation present. No thyromegaly present.  Cardiovascular: Normal rate, regular rhythm, normal heart sounds and intact distal pulses.  Exam reveals no gallop and no friction rub.   No murmur heard. Respiratory: Effort normal and breath sounds normal. No respiratory distress.  She has no wheezes. She has no rales. She exhibits no tenderness.  GI: Soft. Bowel sounds are normal. She exhibits no distension and no mass. There is tenderness. There is no rebound and no guarding.  Genitourinary:       Vulva is normal without lesions Vagina is pink moist without discharge Cervix normal in appearance and pap is normal Uterus is enlarged 16 weeks size some discrepancy on sonogram from 1 year to the next but it is enlarged Adnexa is negative with normal sized ovaries by sonogram  Musculoskeletal: Normal range of motion. She exhibits no edema and no tenderness.  Neurological: She is alert and oriented to person, place, and time. She has normal reflexes. She displays normal reflexes. No cranial nerve deficit. She exhibits normal muscle tone. Coordination normal.  Skin: Skin is warm and dry. No rash noted. No erythema. No pallor.  Psychiatric: She has a normal mood and affect. Her behavior is normal. Judgment and thought content normal.    Labs: Results for orders placed or performed during the hospital encounter of 01/19/23 (from the past 336 hour(s))  Glucose, capillary   Collection Time: 01/19/23  9:01 AM  Result Value Ref Range   Glucose-Capillary 84 70 -  99 mg/dL  Results for orders placed or performed during the hospital encounter of 01/17/23 (from the past 336 hour(s))  CBC   Collection Time: 01/17/23  8:33 AM  Result Value Ref Range   WBC 4.8 4.0 - 10.5 K/uL   RBC 4.55 3.87 - 5.11 MIL/uL   Hemoglobin 12.1 12.0 - 15.0 g/dL   HCT 95.2 84.1 - 32.4 %   MCV 85.1 80.0 - 100.0 fL   MCH 26.6 26.0 - 34.0 pg   MCHC 31.3 30.0 - 36.0 g/dL   RDW 40.1 02.7 - 25.3 %   Platelets 246 150 - 400 K/uL   nRBC 0.0 0.0 - 0.2 %  Comprehensive metabolic panel   Collection Time: 01/17/23  8:33 AM  Result Value Ref Range   Sodium 134 (L) 135 - 145 mmol/L   Potassium 4.3 3.5 - 5.1 mmol/L   Chloride 99 98 - 111 mmol/L   CO2 24 22 - 32 mmol/L   Glucose, Bld 90 70 - 99 mg/dL   BUN 15 6 - 20 mg/dL   Creatinine, Ser 6.64 0.44 - 1.00 mg/dL   Calcium 8.9 8.9 - 40.3 mg/dL   Total Protein 7.5 6.5 - 8.1 g/dL   Albumin 3.8 3.5 - 5.0 g/dL   AST 14 (L) 15 - 41 U/L   ALT 16 0 - 44 U/L   Alkaline Phosphatase 62 38 - 126 U/L   Total Bilirubin 0.8 0.3 - 1.2 mg/dL   GFR, Estimated >47 >42 mL/min   Anion gap 11 5 - 15  Rapid HIV screen (HIV 1/2 Ab+Ag)   Collection Time: 01/17/23  8:33 AM  Result Value Ref Range   HIV-1 P24 Antigen - HIV24 NON REACTIVE NON REACTIVE   HIV 1/2 Antibodies NON REACTIVE NON REACTIVE   Interpretation (HIV Ag Ab)      A non reactive test result means that HIV 1 or HIV 2 antibodies and HIV 1 p24 antigen were not detected in the specimen.  Urinalysis, Routine w reflex microscopic -Urine, Clean Catch   Collection Time: 01/17/23  8:33 AM  Result Value Ref Range   Color, Urine YELLOW YELLOW   APPearance CLEAR CLEAR   Specific Gravity, Urine 1.016 1.005 - 1.030   pH 6.0 5.0 -  8.0   Glucose, UA NEGATIVE NEGATIVE mg/dL   Hgb urine dipstick SMALL (A) NEGATIVE   Bilirubin Urine NEGATIVE NEGATIVE   Ketones, ur NEGATIVE NEGATIVE mg/dL   Protein, ur NEGATIVE NEGATIVE mg/dL   Nitrite NEGATIVE NEGATIVE   Leukocytes,Ua NEGATIVE NEGATIVE   RBC /  HPF 6-10 0 - 5 RBC/hpf   WBC, UA 0-5 0 - 5 WBC/hpf   Bacteria, UA NONE SEEN NONE SEEN   Squamous Epithelial / HPF 6-10 0 - 5 /HPF   Mucus PRESENT   Pregnancy, urine   Collection Time: 01/17/23  8:33 AM  Result Value Ref Range   Preg Test, Ur NEGATIVE NEGATIVE  Hemoglobin A1c   Collection Time: 01/17/23  8:58 AM  Result Value Ref Range   Hgb A1c MFr Bld 6.3 (H) 4.8 - 5.6 %   Mean Plasma Glucose 134 mg/dL  Results for orders placed or performed in visit on 01/11/23 (from the past 336 hour(s))  Hemoglobin A1c   Collection Time: 01/17/23  9:52 AM  Result Value Ref Range   Hgb A1c MFr Bld 6.2 (H) 4.8 - 5.6 %   Est. average glucose Bld gHb Est-mCnc 131 mg/dL  VITAMIN D 25 Hydroxy (Vit-D Deficiency, Fractures)   Collection Time: 01/17/23  9:52 AM  Result Value Ref Range   Vit D, 25-Hydroxy 17.6 (L) 30.0 - 100.0 ng/mL  TSH + free T4   Collection Time: 01/17/23  9:52 AM  Result Value Ref Range   TSH 2.680 0.450 - 4.500 uIU/mL   Free T4 1.18 0.82 - 1.77 ng/dL  Lipid panel   Collection Time: 01/17/23  9:52 AM  Result Value Ref Range   Cholesterol, Total 151 100 - 199 mg/dL   Triglycerides 42 0 - 149 mg/dL   HDL 44 >16 mg/dL   VLDL Cholesterol Cal 9 5 - 40 mg/dL   LDL Chol Calc (NIH) 98 0 - 99 mg/dL   Chol/HDL Ratio 3.4 0.0 - 4.4 ratio  CMP14+EGFR   Collection Time: 01/17/23  9:52 AM  Result Value Ref Range   Glucose 84 70 - 99 mg/dL   BUN 13 6 - 20 mg/dL   Creatinine, Ser 1.09 0.57 - 1.00 mg/dL   eGFR 604 >54 UJ/WJX/9.14   BUN/Creatinine Ratio 19 9 - 23   Sodium 136 134 - 144 mmol/L   Potassium 4.7 3.5 - 5.2 mmol/L   Chloride 98 96 - 106 mmol/L   CO2 25 20 - 29 mmol/L   Calcium 9.4 8.7 - 10.2 mg/dL   Total Protein 7.0 6.0 - 8.5 g/dL   Albumin 4.2 3.9 - 4.9 g/dL   Globulin, Total 2.8 1.5 - 4.5 g/dL   Bilirubin Total 0.5 0.0 - 1.2 mg/dL   Alkaline Phosphatase 78 44 - 121 IU/L   AST 12 0 - 40 IU/L   ALT 13 0 - 32 IU/L  CBC with Differential/Platelet   Collection Time:  01/17/23  9:52 AM  Result Value Ref Range   WBC 5.0 3.4 - 10.8 x10E3/uL   RBC 4.39 3.77 - 5.28 x10E6/uL   Hemoglobin 11.8 11.1 - 15.9 g/dL   Hematocrit 78.2 95.6 - 46.6 %   MCV 84 79 - 97 fL   MCH 26.9 26.6 - 33.0 pg   MCHC 32.2 31.5 - 35.7 g/dL   RDW 21.3 08.6 - 57.8 %   Platelets 260 150 - 450 x10E3/uL   Neutrophils 51 Not Estab. %   Lymphs 37 Not Estab. %   Monocytes 10  Not Estab. %   Eos 2 Not Estab. %   Basos 0 Not Estab. %   Neutrophils Absolute 2.6 1.4 - 7.0 x10E3/uL   Lymphocytes Absolute 1.8 0.7 - 3.1 x10E3/uL   Monocytes Absolute 0.5 0.1 - 0.9 x10E3/uL   EOS (ABSOLUTE) 0.1 0.0 - 0.4 x10E3/uL   Basophils Absolute 0.0 0.0 - 0.2 x10E3/uL   Immature Granulocytes 0 Not Estab. %   Immature Grans (Abs) 0.0 0.0 - 0.1 x10E3/uL    EKG: Orders placed or performed in visit on 12/31/22   EKG 12-Lead    Imaging Studies: DG Chest 2 View  Result Date: 12/30/2022 CLINICAL DATA:  Chest pain. EXAM: CHEST - 2 VIEW COMPARISON:  June 18, 2022 FINDINGS: The heart size and mediastinal contours are within normal limits. There is mild prominence of the central pulmonary vasculature. There is no evidence of focal consolidation, pleural effusion or pneumothorax. The visualized skeletal structures are unremarkable. IMPRESSION: Mild central pulmonary vascular congestion. Electronically Signed   By: Aram Candela M.D.   On: 12/30/2022 15:44      Assessment: HMB DUB Fibroid uterus  Plan: RA TLH BS  Pt understands the risks of surgery including but not limited t  excessive bleeding requiring transfusion or reoperation, post-operative infection requiring prolonged hospitalization or re-hospitalization and antibiotic therapy, and damage to other organs including bladder, bowel, ureters and major vessels.  The patient also understands the alternative treatment options which were discussed in full.  All questions were answered.  Lazaro Arms 01/19/2023 10:39 AM   Lazaro Arms 01/19/2023 10:33 AM

## 2023-01-20 ENCOUNTER — Encounter (HOSPITAL_COMMUNITY): Payer: Self-pay | Admitting: Obstetrics & Gynecology

## 2023-01-20 LAB — BASIC METABOLIC PANEL
Anion gap: 9 (ref 5–15)
BUN: 8 mg/dL (ref 6–20)
CO2: 25 mmol/L (ref 22–32)
Calcium: 8.3 mg/dL — ABNORMAL LOW (ref 8.9–10.3)
Chloride: 99 mmol/L (ref 98–111)
Creatinine, Ser: 0.59 mg/dL (ref 0.44–1.00)
GFR, Estimated: >60 mL/min (ref >60)
Glucose, Bld: 127 mg/dL — ABNORMAL HIGH (ref 70–99)
Potassium: 4.3 mmol/L (ref 3.5–5.1)
Sodium: 133 mmol/L — ABNORMAL LOW (ref 135–145)

## 2023-01-20 LAB — CBC
HCT: 34.7 % — ABNORMAL LOW (ref 36.0–46.0)
Hemoglobin: 11.2 g/dL — ABNORMAL LOW (ref 12.0–15.0)
MCH: 27 pg (ref 26.0–34.0)
MCHC: 32.3 g/dL (ref 30.0–36.0)
MCV: 83.6 fL (ref 80.0–100.0)
Platelets: 274 10*3/uL (ref 150–400)
RBC: 4.15 MIL/uL (ref 3.87–5.11)
RDW: 14.7 % (ref 11.5–15.5)
WBC: 11 10*3/uL — ABNORMAL HIGH (ref 4.0–10.5)
nRBC: 0 % (ref 0.0–0.2)

## 2023-01-20 LAB — GLUCOSE, CAPILLARY
Glucose-Capillary: 148 mg/dL — ABNORMAL HIGH (ref 70–99)
Glucose-Capillary: 120 mg/dL — ABNORMAL HIGH (ref 70–99)
Glucose-Capillary: 119 mg/dL — ABNORMAL HIGH (ref 70–99)
Glucose-Capillary: 101 mg/dL — ABNORMAL HIGH (ref 70–99)
Glucose-Capillary: 83 mg/dL (ref 70–99)

## 2023-01-20 NOTE — Progress Notes (Signed)
Pt lives with her father. No needs reported at this time. TOC will follow.    01/20/23 0922  TOC Brief Assessment  Insurance and Status Reviewed  Patient has primary care physician Yes  Home environment has been reviewed Lives with father.  Prior level of function: Independent.  Prior/Current Home Services No current home services  Social Determinants of Health Reivew SDOH reviewed no interventions necessary  Readmission risk has been reviewed Yes  Transition of care needs no transition of care needs at this time

## 2023-01-20 NOTE — Plan of Care (Signed)
  Problem: Nutrition: Goal: Adequate nutrition will be maintained Outcome: Progressing   Problem: Coping: Goal: Level of anxiety will decrease Outcome: Progressing   

## 2023-01-20 NOTE — Progress Notes (Signed)
1 Day Post-Op Procedure(s) (LRB): LAPAROSCOPY DIAGNOSTIC (N/A) HYSTERECTOMY ABDOMINAL WITH SALPINGECTOMY (Bilateral)   I saw patient at noon, late entry documentation    Past Medical History:  Diagnosis Date   Angio-edema    Anxiety    Arthritis    right shoulder   Asthma    Diabetes mellitus without complication (HCC)    Dyspnea    Dysrhythmia    hx of SVT   Fibroid (bleeding) (uterine)    Fibroid, uterine    H/O metrorrhagia    Hypertension    IUD (intrauterine device) in place 07/11/2015   Menorrhagia    Obesity, Class III, BMI 40-49.9 (morbid obesity) (HCC) 11/11/2018   Sleep apnea    uses CPAP - setting 11   SVT (supraventricular tachycardia)     No past surgical history pertinent negatives on file.  Scheduled Meds:  amLODipine  10 mg Oral Daily   docusate sodium  100 mg Oral BID   enoxaparin (LOVENOX) injection  80 mg Subcutaneous Q24H   fluticasone furoate-vilanterol  1 puff Inhalation Daily   ibuprofen  600 mg Oral Q6H   irbesartan  150 mg Oral Daily   loratadine  10 mg Oral QHS   metFORMIN  500 mg Oral BID WC   montelukast  10 mg Oral QHS   [START ON 01/24/2023] Vitamin D (Ergocalciferol)  50,000 Units Oral Q7 days    Continuous Infusions:  ondansetron (ZOFRAN) IV      PRN Meds:albuterol, alum & mag hydroxide-simeth, bisacodyl, famotidine, gabapentin, ondansetron **OR** ondansetron (ZOFRAN) IV, oxyCODONE, senna-docusate, SUMAtriptan, zolpidem  Allergies  Allergen Reactions   Shellfish Allergy Shortness Of Breath, Itching and Swelling    Mouth became swollen, throat started itching, and developed shortness of breath   Sulfa Antibiotics Itching, Shortness Of Breath and Swelling    Mouth became swollen, throat started itching, and developed shortness of breath   Adhesive [Tape] Itching    Depends on the type of medical tape    Principal Problem:   S/P hysterectomy   Subjective   Pt is in post op pain But moving ok and voiding Tolerating  liquids and some food  Objective   Vitals:   01/20/23 0251 01/20/23 0507 01/20/23 0834 01/20/23 1348  BP:  (!) 147/78  (!) 108/36  Pulse: (!) 101 (!) 105  80  Resp: 20 19  18   Temp:  98.1 F (36.7 C)  98 F (36.7 C)  TempSrc:  Oral  Oral  SpO2: 99% 97% 95% 90%  Weight:      Height:       Vitals:   01/19/23 0941 01/19/23 1552 01/19/23 1600 01/19/23 1615  BP: 132/82 (!) 118/91 120/81 129/78   01/19/23 1630 01/19/23 1645 01/19/23 1700 01/19/23 1714  BP: 112/89 118/76 124/83 123/84   01/19/23 2015 01/20/23 0050 01/20/23 0507 01/20/23 1348  BP: 126/86 127/69 (!) 147/78 (!) 108/36     Subjective Objective: Vital signs (most recent): Blood pressure (!) 108/36, pulse 80, temperature 98 F (36.7 C), temperature source Oral, resp. rate 18, height 5\' 2"  (1.575 m), weight (!) 160.1 kg, last menstrual period 12/26/2022, SpO2 90%.   Gen  obese female in mild distress Abdomen  soft benign normal post op Incision  under dressing which is dry     Latest Ref Rng & Units 01/20/2023    4:09 AM 01/17/2023    9:52 AM 01/17/2023    8:33 AM  CBC  WBC 4.0 - 10.5 K/uL 11.0  5.0  4.8   Hemoglobin 12.0 - 15.0 g/dL 16.1  09.6  04.5   Hematocrit 36.0 - 46.0 % 34.7  36.7  38.7   Platelets 150 - 400 K/uL 274  260  246        Latest Ref Rng & Units 01/20/2023    4:09 AM 01/17/2023    9:52 AM 01/17/2023    8:33 AM  CMP  Glucose 70 - 99 mg/dL 409  84  90   BUN 6 - 20 mg/dL 8  13  15    Creatinine 0.44 - 1.00 mg/dL 8.11  9.14  7.82   Sodium 135 - 145 mmol/L 133  136  134   Potassium 3.5 - 5.1 mmol/L 4.3  4.7  4.3   Chloride 98 - 111 mmol/L 99  98  99   CO2 22 - 32 mmol/L 25  25  24    Calcium 8.9 - 10.3 mg/dL 8.3  9.4  8.9   Total Protein 6.0 - 8.5 g/dL  7.0  7.5   Total Bilirubin 0.0 - 1.2 mg/dL  0.5  0.8   Alkaline Phos 44 - 121 IU/L  78  62   AST 0 - 40 IU/L  12  14   ALT 0 - 32 IU/L  13  16      Assessment & Plan POD#1 Dx laparoscopy-->TAH + BS for large fibroid uterus, after scope  decided could not do a RA TLH'  Unremarkable post op course Because of her morbid obesity will keep on lovenox 80 mg for 2 weeks S/p levaquin Anticipate discharge tomorrow  Lazaro Arms, MD 01/20/2023, 5:20 PM

## 2023-01-20 NOTE — Progress Notes (Signed)
Patient got up and walked to the hallway and back. Provided PO liquids to drink.  Patient attempted to void. Unable at this time. Bladder scan performed. 147 ml in bladder. Message sent to Dr. Despina Hidden. Plan of care ongoing.

## 2023-01-20 NOTE — Progress Notes (Signed)
Pt's foley d/c with no complications at 0530. Pt educated about the importance of mobility and incentive spirometer. Pt shown how to use incentive spirometer with a teach back method again. Evidence of learning and understanding was shown. Pt has shown interest to eat this morning. Pt is currently in chair with her incentive spirometer and call bell within reach, and she is watching TV at this time. Staff will continue to monitor pt and pain level.

## 2023-01-20 NOTE — Progress Notes (Signed)
Bladder scan soes 21ml x3 attempts

## 2023-01-20 NOTE — Plan of Care (Signed)
  Problem: Education: Goal: Knowledge of the prescribed therapeutic regimen will improve Outcome: Progressing   Problem: Self-Concept: Goal: Communication of feelings regarding changes in body function or appearance will improve Outcome: Progressing   Problem: Skin Integrity: Goal: Demonstration of wound healing without infection will improve Outcome: Progressing   Problem: Education: Goal: Knowledge of General Education information will improve Description: Including pain rating scale, medication(s)/side effects and non-pharmacologic comfort measures Outcome: Progressing   Problem: Health Behavior/Discharge Planning: Goal: Ability to manage health-related needs will improve Outcome: Progressing   Problem: Clinical Measurements: Goal: Diagnostic test results will improve Outcome: Progressing Goal: Respiratory complications will improve Outcome: Progressing Goal: Cardiovascular complication will be avoided Outcome: Progressing

## 2023-01-20 NOTE — Progress Notes (Signed)
MEWS Progress Note  Patient Details Name: Jessica Sanchez MRN: [id number] DOB: 1986-03-31 Today's Date: 01/20/2023   MEWS Flowsheet Documentation:  Assess: MEWS Score Temp: 98.4 F (36.9 C) BP: 127/69 MAP (mmHg): 88 Pulse Rate: (!) 106 ECG Heart Rate: 89 Resp: (!) 22 Level of Consciousness: Alert SpO2: 97 % O2 Device: Nasal Cannula Patient Activity (if Appropriate): In bed O2 Flow Rate (L/min): 2 L/min Assess: MEWS Score MEWS Temp: 0 MEWS Systolic: 0 MEWS Pulse: 1 MEWS RR: 1 MEWS LOC: 0 MEWS Score: 2 MEWS Score Color: Yellow Assess: SIRS CRITERIA SIRS Temperature : 0 SIRS Respirations : 1 SIRS Pulse: 1 SIRS WBC: 0 SIRS Score Sum : 2     Pain c/o 10/10 pain after awaking. PRN fentanyl given and Rennis Petty, MD notified about VS change. No new orders at this time. Staff will continue to monitor patient's vital signs and pain.     [name] 01/20/2023, 1:25 AM

## 2023-01-20 NOTE — Progress Notes (Signed)
Pt is in 10/10 pain. Pain meds given (SEE MAR)

## 2023-01-21 DIAGNOSIS — Z91013 Allergy to seafood: Secondary | ICD-10-CM | POA: Diagnosis not present

## 2023-01-21 DIAGNOSIS — Z91048 Other nonmedicinal substance allergy status: Secondary | ICD-10-CM | POA: Diagnosis not present

## 2023-01-21 DIAGNOSIS — D251 Intramural leiomyoma of uterus: Secondary | ICD-10-CM | POA: Diagnosis not present

## 2023-01-21 DIAGNOSIS — D259 Leiomyoma of uterus, unspecified: Secondary | ICD-10-CM | POA: Diagnosis not present

## 2023-01-21 DIAGNOSIS — Z833 Family history of diabetes mellitus: Secondary | ICD-10-CM | POA: Diagnosis not present

## 2023-01-21 DIAGNOSIS — J45909 Unspecified asthma, uncomplicated: Secondary | ICD-10-CM | POA: Diagnosis not present

## 2023-01-21 DIAGNOSIS — Z6841 Body Mass Index (BMI) 40.0 and over, adult: Secondary | ICD-10-CM | POA: Diagnosis not present

## 2023-01-21 DIAGNOSIS — R338 Other retention of urine: Secondary | ICD-10-CM | POA: Diagnosis not present

## 2023-01-21 DIAGNOSIS — Z882 Allergy status to sulfonamides status: Secondary | ICD-10-CM | POA: Diagnosis not present

## 2023-01-21 DIAGNOSIS — I10 Essential (primary) hypertension: Secondary | ICD-10-CM | POA: Diagnosis not present

## 2023-01-21 DIAGNOSIS — E119 Type 2 diabetes mellitus without complications: Secondary | ICD-10-CM | POA: Diagnosis not present

## 2023-01-21 DIAGNOSIS — N938 Other specified abnormal uterine and vaginal bleeding: Secondary | ICD-10-CM | POA: Diagnosis not present

## 2023-01-21 DIAGNOSIS — Z8249 Family history of ischemic heart disease and other diseases of the circulatory system: Secondary | ICD-10-CM | POA: Diagnosis not present

## 2023-01-21 LAB — SURGICAL PATHOLOGY

## 2023-01-21 LAB — GLUCOSE, CAPILLARY
Glucose-Capillary: 74 mg/dL (ref 70–99)
Glucose-Capillary: 83 mg/dL (ref 70–99)
Glucose-Capillary: 96 mg/dL (ref 70–99)
Glucose-Capillary: 80 mg/dL (ref 70–99)

## 2023-01-21 NOTE — Progress Notes (Signed)
Patient up walking to bathroom to attempt to void with assist. In quite a bit of pain. Patient felt as though she needed to void and after several attempts patient was in/out cath per PRN standing order. Patient quickly relieved yellow urine. Patient had quite a bit of swelling to area. Patient was also found to be 70% on RA. Patient was placed back on 2l/[place] and sats improved to 95%. Dr. Despina Hidden was informed of findings and vitals. No new orders at this time.

## 2023-01-21 NOTE — Progress Notes (Signed)
Patient ambulated to nurses station. Patient o2 maintained at 91% on RA, however she struggled and stopped several times and ultimately we helped her back to her room.She sat down and oxygen began to drop as documented on flowsheet into the 70s. Patient was placed back on 2l/[place]. Patient also attempted to void just prior to walk, and was not successful. Sats rechecked; 95% on the 2l/[place].

## 2023-01-21 NOTE — Progress Notes (Signed)
New orders received.

## 2023-01-21 NOTE — Progress Notes (Signed)
Patient requesting pain medications. Unable to give anything else at this time. Notified Dr. Despina Hidden.

## 2023-01-21 NOTE — Progress Notes (Signed)
Patient ID: Jessica Sanchez, female   DOB: April 07, 1986, 37 y.o.   MRN: [id number] 2 Days Post-Op Procedure(s) (LRB): LAPAROSCOPY DIAGNOSTIC (N/A) HYSTERECTOMY ABDOMINAL WITH SALPINGECTOMY (Bilateral)  I saw the patient at about 1215 earlier today  Jessica Sanchez is a 37 y.o. female patient.     Past Medical History:  Diagnosis Date   Angio-edema    Anxiety    Arthritis    right shoulder   Asthma    Diabetes mellitus without complication (HCC)    Dyspnea    Dysrhythmia    hx of SVT   Fibroid (bleeding) (uterine)    Fibroid, uterine    H/O metrorrhagia    Hypertension    IUD (intrauterine device) in place 07/11/2015   Menorrhagia    Obesity, Class III, BMI 40-49.9 (morbid obesity) (HCC) 11/11/2018   Sleep apnea    uses CPAP - setting 11   SVT (supraventricular tachycardia)     No past surgical history pertinent negatives on file.  Scheduled Meds:  amLODipine  10 mg Oral Daily   docusate sodium  100 mg Oral BID   enoxaparin (LOVENOX) injection  80 mg Subcutaneous Q24H   fluticasone furoate-vilanterol  1 puff Inhalation Daily   ibuprofen  600 mg Oral Q6H   irbesartan  150 mg Oral Daily   loratadine  10 mg Oral QHS   metFORMIN  500 mg Oral BID WC   montelukast  10 mg Oral QHS   [START ON 01/24/2023] Vitamin D (Ergocalciferol)  50,000 Units Oral Q7 days    Continuous Infusions:  ondansetron (ZOFRAN) IV      PRN Meds:albuterol, alum & mag hydroxide-simeth, bisacodyl, famotidine, gabapentin, ondansetron **OR** ondansetron (ZOFRAN) IV, oxyCODONE, senna-docusate, SUMAtriptan, zolpidem  Allergies  Allergen Reactions   Shellfish Allergy Shortness Of Breath, Itching and Swelling    Mouth became swollen, throat started itching, and developed shortness of breath   Sulfa Antibiotics Itching, Shortness Of Breath and Swelling    Mouth became swollen, throat started itching, and developed shortness of breath   Adhesive [Tape] Itching    Depends on the type of medical tape     Principal Problem:   S/P hysterectomy   Subjective   Pt is still not able to urinate spontaneously, her urine production is good but has not voided spontaneously Pain is also significant but partially related to the urinary retention No flatus as of earlier today about 1215  Objective   Vitals:   01/21/23 0910 01/21/23 1302 01/21/23 1307 01/21/23 1519  BP:  (!) 99/56  121/62  Pulse:  88  84  Resp:  18  20  Temp:  97.7 F (36.5 C)  98.3 F (36.8 C)  TempSrc:  Oral  Oral  SpO2: 95% (!) 76% 94% 98%  Weight:      Height:       Vitals:   01/19/23 1630 01/19/23 1645 01/19/23 1700 01/19/23 1714  BP: 112/89 118/76 124/83 123/84   01/19/23 2015 01/20/23 0050 01/20/23 0507 01/20/23 1348  BP: 126/86 127/69 (!) 147/78 (!) 108/36   01/20/23 2046 01/21/23 0328 01/21/23 1302 01/21/23 1519  BP: (!) 107/58 (!) 110/59 (!) 99/56 121/62     Subjective Objective: Vital signs (most recent): Blood pressure 121/62, pulse 84, temperature 98.3 F (36.8 C), temperature source Oral, resp. rate 20, height 5\' 2"  (1.575 m), weight (!) 160.1 kg, last menstrual period 12/26/2022, SpO2 98%.   Gen  obese female in mild distress due to pain Abdomen  soft  benign normal post op Incision  dressing is dry     Latest Ref Rng & Units 01/20/2023    4:09 AM 01/17/2023    9:52 AM 01/17/2023    8:33 AM  CBC  WBC 4.0 - 10.5 K/uL 11.0  5.0  4.8   Hemoglobin 12.0 - 15.0 g/dL 16.1  09.6  04.5   Hematocrit 36.0 - 46.0 % 34.7  36.7  38.7   Platelets 150 - 400 K/uL 274  260  246        Latest Ref Rng & Units 01/20/2023    4:09 AM 01/17/2023    9:52 AM 01/17/2023    8:33 AM  CMP  Glucose 70 - 99 mg/dL 409  84  90   BUN 6 - 20 mg/dL 8  13  15    Creatinine 0.44 - 1.00 mg/dL 8.11  9.14  7.82   Sodium 135 - 145 mmol/L 133  136  134   Potassium 3.5 - 5.1 mmol/L 4.3  4.7  4.3   Chloride 98 - 111 mmol/L 99  98  99   CO2 22 - 32 mmol/L 25  25  24    Calcium 8.9 - 10.3 mg/dL 8.3  9.4  8.9   Total Protein 6.0 -  8.5 g/dL  7.0  7.5   Total Bilirubin 0.0 - 1.2 mg/dL  0.5  0.8   Alkaline Phos 44 - 121 IU/L  78  62   AST 0 - 40 IU/L  12  14   ALT 0 - 32 IU/L  13  16      Assessment & Plan POD#2 TAH + BS(after diagnostic laparoscopy) Post op course complicated by urinary retention Will replace foley and leave it if she needs In/out again and leave til the end of next week Recheck labs tomorrow am Plan discharge tomorrow  Lazaro Arms, MD 01/21/2023, 8:18 PM

## 2023-01-21 NOTE — Progress Notes (Signed)
Patient requested to get up and go to the bathroom. Tolerated well. Still unable to void. States" It feels like I can go but I know its going to hurt". Explained to patient that this is to be expected to have some discomfort. Plan of care ongoing.

## 2023-01-22 DIAGNOSIS — N938 Other specified abnormal uterine and vaginal bleeding: Secondary | ICD-10-CM

## 2023-01-22 DIAGNOSIS — D251 Intramural leiomyoma of uterus: Secondary | ICD-10-CM | POA: Diagnosis not present

## 2023-01-22 LAB — COMPREHENSIVE METABOLIC PANEL
ALT: 12 U/L (ref 0–44)
AST: 10 U/L — ABNORMAL LOW (ref 15–41)
Albumin: 3.2 g/dL — ABNORMAL LOW (ref 3.5–5.0)
Alkaline Phosphatase: 49 U/L (ref 38–126)
Anion gap: 8 (ref 5–15)
BUN: 9 mg/dL (ref 6–20)
CO2: 28 mmol/L (ref 22–32)
Calcium: 8.4 mg/dL — ABNORMAL LOW (ref 8.9–10.3)
Chloride: 98 mmol/L (ref 98–111)
Creatinine, Ser: 0.59 mg/dL (ref 0.44–1.00)
GFR, Estimated: >60 mL/min (ref >60)
Glucose, Bld: 112 mg/dL — ABNORMAL HIGH (ref 70–99)
Potassium: 4.3 mmol/L (ref 3.5–5.1)
Sodium: 134 mmol/L — ABNORMAL LOW (ref 135–145)
Total Bilirubin: 0.6 mg/dL (ref 0.3–1.2)
Total Protein: 6.9 g/dL (ref 6.5–8.1)

## 2023-01-22 LAB — CBC WITH DIFFERENTIAL/PLATELET
Abs Immature Granulocytes: 0.07 10*3/uL (ref 0.00–0.07)
Basophils Absolute: 0 10*3/uL (ref 0.0–0.1)
Basophils Relative: 0 %
Eosinophils Absolute: 0 10*3/uL (ref 0.0–0.5)
Eosinophils Relative: 1 %
HCT: 35 % — ABNORMAL LOW (ref 36.0–46.0)
Hemoglobin: 10.6 g/dL — ABNORMAL LOW (ref 12.0–15.0)
Immature Granulocytes: 1 %
Lymphocytes Relative: 13 %
Lymphs Abs: 1 10*3/uL (ref 0.7–4.0)
MCH: 26.4 pg (ref 26.0–34.0)
MCHC: 30.3 g/dL (ref 30.0–36.0)
MCV: 87.1 fL (ref 80.0–100.0)
Monocytes Absolute: 0.5 10*3/uL (ref 0.1–1.0)
Monocytes Relative: 7 %
Neutro Abs: 6.1 10*3/uL (ref 1.7–7.7)
Neutrophils Relative %: 78 %
Platelets: 256 10*3/uL (ref 150–400)
RBC: 4.02 MIL/uL (ref 3.87–5.11)
RDW: 14.8 % (ref 11.5–15.5)
WBC: 7.7 10*3/uL (ref 4.0–10.5)
nRBC: 0 % (ref 0.0–0.2)

## 2023-01-22 LAB — GLUCOSE, CAPILLARY
Glucose-Capillary: 116 mg/dL — ABNORMAL HIGH (ref 70–99)
Glucose-Capillary: 86 mg/dL (ref 70–99)

## 2023-01-22 MED ORDER — BISACODYL 10 MG RE SUPP: 10.0000 mg | Freq: Every day | RECTAL | 0 refills | Status: DC | PRN

## 2023-01-22 MED ORDER — VITAMIN D (ERGOCALCIFEROL) 1.25 MG (50000 UNIT) PO CAPS: 50000.0000 [IU] | ORAL_CAPSULE | ORAL | 2 refills | Status: DC

## 2023-01-22 MED ORDER — ENOXAPARIN SODIUM 80 MG/0.8ML IJ SOSY: 80.0000 mg | PREFILLED_SYRINGE | INTRAMUSCULAR | 0 refills | Status: DC

## 2023-01-22 MED ORDER — IBUPROFEN 600 MG PO TABS: 600.0000 mg | ORAL_TABLET | Freq: Four times a day (QID) | ORAL | 0 refills | Status: DC

## 2023-01-22 MED ORDER — CIPROFLOXACIN HCL 500 MG PO TABS: 500.0000 mg | ORAL_TABLET | Freq: Two times a day (BID) | ORAL | 0 refills | Status: DC

## 2023-01-22 MED ORDER — OXYCODONE HCL 5 MG PO TABS: 5.0000 mg | ORAL_TABLET | ORAL | 0 refills | Status: DC | PRN

## 2023-01-22 MED ORDER — BISACODYL 10 MG RE SUPP
10.0000 mg | Freq: Once | RECTAL | Status: AC
  Administered 2023-01-22: 10 mg via RECTAL
  Filled 2023-01-22: qty 1

## 2023-01-22 NOTE — Discharge Summary (Addendum)
Physician Discharge Summary  Patient ID: Jessica Sanchez MRN: [id number] DOB/AGE: 37-Aug-1987 37 y.o.  Admit date: 01/19/2023 Discharge date: 01/22/2023  Admission Diagnoses: S/P TAH + BS Morbid obesity  Discharge Diagnoses:  Principal Problem:   S/P hysterectomy   Discharged Condition: stable  Hospital Course: pt was admitted post op She had post op urinary retention Also difficulty ambulating due to morbid obesity along with surgery Place foley which remains at discharge, cipro to cover Continue lovenox 80 daily  Consults:   Significant Diagnostic Studies: labs:   Treatments: TAH BS  Discharge Exam: Blood pressure 105/62, pulse 82, temperature 98.1 F (36.7 C), temperature source Oral, resp. rate 20, height 5\' 2"  (1.575 m), weight (!) 160.1 kg, last menstrual period 12/26/2022, SpO2 96%. General appearance: alert, cooperative, and no distress GI: normal post op Incision/Wound:dressing is dry  Disposition: Discharge disposition: 01-Home or Self Care       Discharge Instructions     Call MD for:  persistant nausea and vomiting   Complete by: As directed    Call MD for:  severe uncontrolled pain   Complete by: As directed    Call MD for:  temperature >100.4   Complete by: As directed    Diet - low sodium heart healthy   Complete by: As directed    Driving Restrictions   Complete by: As directed    No driving til 2/72/53   Increase activity slowly   Complete by: As directed    Leave dressing on - Keep it clean, dry, and intact until clinic visit   Complete by: As directed    Lifting restrictions   Complete by: As directed    Do not lift more than 20 pounds   Sexual Activity Restrictions   Complete by: As directed    No sex 8 weeks        Follow-up Information     Lazaro Arms, MD Follow up on 01/27/2023.   Specialties: Obstetrics and Gynecology, Radiology Why: post op visit 0900 am Contact information: [address] [place]  [address] [phone]                 Signed: Lazaro Arms 01/22/2023, 1:14 PM

## 2023-01-22 NOTE — TOC Transition Note (Signed)
Transition of Care [facility]) - CM/SW Discharge Note   Patient Details  Name: Jessica Sanchez MRN: [id number] Date of Birth: 1985/08/18  Transition of Care [facility]) CM/SW Contact:  Princella Ion, LCSW Phone Number: 01/22/2023, 3:47 PM   Clinical Narrative:    Patient admitted post-op (hysterectomy) for urinary retention. CSW notified by LPN that pt is requesting a walker. Charge RN informed that pt reports she has a walker at home. No further TOC needs.      Barriers to Discharge: Continued Medical Work up   Patient Goals and CMS Choice      Discharge Placement                         Discharge Plan and Services Additional resources added to the After Visit Summary for                                       Social Determinants of Health (SDOH) Interventions SDOH Screenings   Food Insecurity: No Food Insecurity (01/19/2023)  Housing: Low Risk  (01/19/2023)  Transportation Needs: No Transportation Needs (01/19/2023)  Utilities: Not At Risk (01/19/2023)  Depression (PHQ2-9): Medium Risk (01/11/2023)  Tobacco Use: Low Risk  (01/19/2023)     Readmission Risk Interventions     No data to display

## 2023-01-22 NOTE — Progress Notes (Signed)
Patient vomited approximately 200 ml of undigested food mixed with gastric contents. Prn medication given.

## 2023-01-22 NOTE — Progress Notes (Signed)
80F foley catheter inserted. Patient tolerated fair. Plan of care ongoing.

## 2023-01-24 NOTE — Anesthesia Postprocedure Evaluation (Signed)
Anesthesia Post Note  Patient: Jessica Sanchez  Procedure(s) Performed: LAPAROSCOPY DIAGNOSTIC HYSTERECTOMY ABDOMINAL WITH SALPINGECTOMY (Bilateral: Abdomen)  Patient location during evaluation: Phase II Anesthesia Type: General Level of consciousness: awake Pain management: pain level controlled Vital Signs Assessment: post-procedure vital signs reviewed and stable Respiratory status: spontaneous breathing and respiratory function stable Cardiovascular status: blood pressure returned to baseline and stable Postop Assessment: no headache and no apparent nausea or vomiting Anesthetic complications: no Comments: Late entry   No notable events documented.   Last Vitals:  Vitals:   01/22/23 0819 01/22/23 0834  BP:  105/62  Pulse:    Resp:    Temp:    SpO2: 97% 96%    Last Pain:  Vitals:   01/22/23 1100  TempSrc:   PainSc: 0-No pain                 Windell Norfolk

## 2023-01-26 ENCOUNTER — Other Ambulatory Visit (HOSPITAL_COMMUNITY): Payer: Self-pay

## 2023-01-26 DIAGNOSIS — D219 Benign neoplasm of connective and other soft tissue, unspecified: Secondary | ICD-10-CM

## 2023-01-27 ENCOUNTER — Encounter: Payer: Self-pay | Admitting: Obstetrics & Gynecology

## 2023-01-27 ENCOUNTER — Ambulatory Visit (INDEPENDENT_AMBULATORY_CARE_PROVIDER_SITE_OTHER): Payer: No Typology Code available for payment source | Admitting: Obstetrics & Gynecology

## 2023-01-27 VITALS — BP 113/68 | HR 74

## 2023-01-27 DIAGNOSIS — Z9889 Other specified postprocedural states: Secondary | ICD-10-CM

## 2023-01-27 DIAGNOSIS — R338 Other retention of urine: Secondary | ICD-10-CM

## 2023-01-27 NOTE — Progress Notes (Signed)
HPI: Patient returns for routine postoperative follow-up having undergone Dx scope-->TAH + BS on 01/19/23.  The patient's immediate postoperative recovery has been unremarkable. Since hospital discharge the patient reports doing ok, urinary catheter is in place.   Current Outpatient Medications: albuterol (VENTOLIN HFA) 108 (90 Base) MCG/ACT inhaler, Inhale 2 puffs into the lungs every 6 (six) hours as needed for wheezing or shortness of breath., Disp: 8 g, Rfl: 0 amlodipine-olmesartan (AZOR) 10-20 MG tablet, Take 1 tablet by mouth daily., Disp: 30 tablet, Rfl: 1 bisacodyl (DULCOLAX) 10 MG suppository, Place 1 suppository (10 mg total) rectally daily as needed for moderate constipation., Disp: 12 suppository, Rfl: 0 Blood Glucose Monitoring Suppl DEVI, 1 each by Does not apply route in the morning, at noon, and at bedtime. May substitute to any manufacturer covered by patient's insurance., Disp: 1 each, Rfl: 0 ciprofloxacin (CIPRO) 500 MG tablet, Take 1 tablet (500 mg total) by mouth 2 (two) times daily., Disp: 14 tablet, Rfl: 0 enoxaparin (LOVENOX) 80 MG/0.8ML injection, Inject 0.8 mLs (80 mg total) into the skin daily., Disp: 14 mL, Rfl: 0 famotidine (PEPCID) 20 MG tablet, Take 1 tablet (20 mg total) by mouth 2 (two) times daily as needed., Disp: 60 tablet, Rfl: 2 fexofenadine (ALLEGRA) 180 MG tablet, Take 180 mg by mouth daily., Disp: , Rfl:  fluticasone furoate-vilanterol (BREO ELLIPTA) 200-25 MCG/ACT AEPB, Inhale 1 puff into the lungs daily., Disp: 1 each, Rfl: 5 gabapentin (NEURONTIN) 100 MG capsule, Take 1 capsule (100 mg total) by mouth at bedtime. (Patient taking differently: Take 100 mg by mouth daily as needed (pain).), Disp: 90 capsule, Rfl: 1 Glucose Blood (BLOOD GLUCOSE TEST STRIPS) STRP, 1 each by In Vitro route in the morning, at noon, and at bedtime. May substitute to any manufacturer covered by patient's insurance., Disp: 100 strip, Rfl: 0 ibuprofen (ADVIL) 600 MG tablet, Take 1  tablet (600 mg total) by mouth every 6 (six) hours., Disp: 30 tablet, Rfl: 0 Lancet Device MISC, 1 each by Does not apply route in the morning, at noon, and at bedtime. May substitute to any manufacturer covered by patient's insurance., Disp: 1 each, Rfl: 0 Lancets Misc. MISC, 1 each by Does not apply route in the morning, at noon, and at bedtime. May substitute to any manufacturer covered by patient's insurance., Disp: 100 each, Rfl: 0 levocetirizine (XYZAL) 5 MG tablet, Take 1 tablet (5 mg total) by mouth in the morning and at bedtime., Disp: 60 tablet, Rfl: 0 metFORMIN (GLUCOPHAGE) 1000 MG tablet, Take 0.5 tablets (500 mg total) by mouth 2 (two) times daily with a meal., Disp: 180 tablet, Rfl: 3 montelukast (SINGULAIR) 10 MG tablet, Take 1 tablet (10 mg total) by mouth at bedtime., Disp: 30 tablet, Rfl: 2 Multiple Vitamins-Calcium (ONE-A-DAY WOMENS PO), Take 1 tablet by mouth daily., Disp: , Rfl:  NON FORMULARY, Pt uses a cpap nightly, Disp: , Rfl:  oxyCODONE (OXY IR/ROXICODONE) 5 MG immediate release tablet, Take 1-2 tablets (5-10 mg total) by mouth every 4 (four) hours as needed for moderate pain., Disp: 30 tablet, Rfl: 0 SUMAtriptan (IMITREX) 100 MG tablet, Take 1 tablet (100 mg total) by mouth every 2 (two) hours as needed for migraine. May repeat in 2 hours if headache persists or recurs., Disp: 10 tablet, Rfl: 2 Vitamin D, Ergocalciferol, (DRISDOL) 1.25 MG (50000 UNIT) CAPS capsule, Take 1 capsule (50,000 Units total) by mouth every 7 (seven) days., Disp: 20 capsule, Rfl: 1 Vitamin D, Ergocalciferol, (DRISDOL) 1.25 MG (50000 UNIT) CAPS capsule, Take 1  capsule (50,000 Units total) by mouth every 7 (seven) days., Disp: 20 capsule, Rfl: 2 Semaglutide,0.25 or 0.5MG /DOS, (OZEMPIC, 0.25 OR 0.5 MG/DOSE,) 2 MG/3ML SOPN, Inject 0.25 mg into the skin once a week. (Patient not taking: Reported on 01/27/2023), Disp: 3 mL, Rfl: 0  No current facility-administered medications for this visit.    Blood  pressure 113/68, pulse 74, last menstrual period 12/26/2022.  Physical Exam: Dressing removed Incision looks good Some skin changes due to lymphatic compression No cellulitis Incision is clean dry intact Local care reviewed including washing with soap and water 3 times per day  Diagnostic Tests:   Pathology: benign  Impression + Management plan: (Z61.096) Post-operative state: TAH + BS 01/19/23  (primary encounter diagnosis)  (R33.8) Acute urinary retention, post op Comment: catheter is removed today for bladder trial, will call after lunch to assess bladder function and replace her catheter by end of day if needed     Medications Prescribed this encounter: No orders of the defined types were placed in this encounter.     Follow up: Will decide on follow up based on her bladder performance   Lazaro Arms, MD Attending Physician for the [facility] 01/27/2023 9:39 AM

## 2023-01-28 ENCOUNTER — Encounter: Payer: No Typology Code available for payment source | Admitting: Obstetrics & Gynecology

## 2023-01-31 ENCOUNTER — Emergency Department (HOSPITAL_COMMUNITY): Payer: No Typology Code available for payment source

## 2023-01-31 ENCOUNTER — Other Ambulatory Visit: Payer: Self-pay

## 2023-01-31 ENCOUNTER — Ambulatory Visit
Admission: EM | Admit: 2023-01-31 | Discharge: 2023-01-31 | Disposition: A | Discharge status: Home / Self Care | Payer: No Typology Code available for payment source

## 2023-01-31 ENCOUNTER — Emergency Department (HOSPITAL_COMMUNITY)
Admission: EM | Admit: 2023-01-31 | Discharge: 2023-02-01 | Disposition: A | Discharge status: Home / Self Care | Payer: No Typology Code available for payment source | Attending: Emergency Medicine | Admitting: Emergency Medicine

## 2023-01-31 ENCOUNTER — Telehealth: Payer: Self-pay | Admitting: Family Medicine

## 2023-01-31 DIAGNOSIS — I10 Essential (primary) hypertension: Secondary | ICD-10-CM | POA: Insufficient documentation

## 2023-01-31 DIAGNOSIS — R42 Dizziness and giddiness: Secondary | ICD-10-CM

## 2023-01-31 DIAGNOSIS — Z794 Long term (current) use of insulin: Secondary | ICD-10-CM | POA: Insufficient documentation

## 2023-01-31 DIAGNOSIS — R072 Precordial pain: Secondary | ICD-10-CM | POA: Diagnosis not present

## 2023-01-31 DIAGNOSIS — J45909 Unspecified asthma, uncomplicated: Secondary | ICD-10-CM | POA: Diagnosis not present

## 2023-01-31 DIAGNOSIS — R079 Chest pain, unspecified: Secondary | ICD-10-CM | POA: Diagnosis not present

## 2023-01-31 DIAGNOSIS — Z7901 Long term (current) use of anticoagulants: Secondary | ICD-10-CM | POA: Insufficient documentation

## 2023-01-31 DIAGNOSIS — E119 Type 2 diabetes mellitus without complications: Secondary | ICD-10-CM | POA: Insufficient documentation

## 2023-01-31 DIAGNOSIS — R0602 Shortness of breath: Secondary | ICD-10-CM | POA: Diagnosis not present

## 2023-01-31 DIAGNOSIS — R600 Localized edema: Secondary | ICD-10-CM

## 2023-01-31 DIAGNOSIS — Z7984 Long term (current) use of oral hypoglycemic drugs: Secondary | ICD-10-CM | POA: Diagnosis not present

## 2023-01-31 LAB — BASIC METABOLIC PANEL
Anion gap: 8 (ref 5–15)
BUN: 11 mg/dL (ref 6–20)
CO2: 26 mmol/L (ref 22–32)
Calcium: 9 mg/dL (ref 8.9–10.3)
Chloride: 101 mmol/L (ref 98–111)
Creatinine, Ser: 0.78 mg/dL (ref 0.44–1.00)
GFR, Estimated: >60 mL/min (ref >60)
Glucose, Bld: 92 mg/dL (ref 70–99)
Potassium: 4.2 mmol/L (ref 3.5–5.1)
Sodium: 135 mmol/L (ref 135–145)

## 2023-01-31 LAB — D-DIMER, QUANTITATIVE: D-Dimer, Quant: 1.34 ug{FEU}/mL — ABNORMAL HIGH (ref 0.00–0.50)

## 2023-01-31 LAB — CBC
HCT: 33.9 % — ABNORMAL LOW (ref 36.0–46.0)
Hemoglobin: 10.5 g/dL — ABNORMAL LOW (ref 12.0–15.0)
MCH: 26.4 pg (ref 26.0–34.0)
MCHC: 31 g/dL (ref 30.0–36.0)
MCV: 85.4 fL (ref 80.0–100.0)
Platelets: 345 10*3/uL (ref 150–400)
RBC: 3.97 MIL/uL (ref 3.87–5.11)
RDW: 14.5 % (ref 11.5–15.5)
WBC: 8 10*3/uL (ref 4.0–10.5)
nRBC: 0 % (ref 0.0–0.2)

## 2023-01-31 LAB — TROPONIN I (HIGH SENSITIVITY)
Troponin I (High Sensitivity): <2 ng/L (ref <18)
Troponin I (High Sensitivity): <2 ng/L (ref <18)

## 2023-01-31 MED ORDER — IOHEXOL 350 MG/ML SOLN
100.0000 mL | Freq: Once | INTRAVENOUS | Status: AC | PRN
  Administered 2023-02-01: 100 mL via INTRAVENOUS

## 2023-01-31 NOTE — ED Provider Notes (Signed)
[facility]    CSN: [id number] Arrival date & time: 01/31/23  1542      History   Chief Complaint No chief complaint on file.   HPI Jessica Sanchez is a 37 y.o. female.   Patient presenting today complaining of squeezing chest pain and pressure, shortness of breath that started last night on the right side of chest.  She has also been dizzy off and on.  Denies wheezing, fevers, chills, cough, abdominal pain, nausea vomiting or diarrhea, diaphoresis.  She has been having significant lower extremity edema but this has been since her hysterectomy 2 weeks ago.  She does have a history of asthma but states her albuterol is not helping with her chest pain.  Past medical history significant for hypertension, diabetes, history of SVT.     Past Medical History:  Diagnosis Date   Angio-edema    Anxiety    Arthritis    right shoulder   Asthma    Diabetes mellitus without complication (HCC)    Dyspnea    Dysrhythmia    hx of SVT   Fibroid (bleeding) (uterine)    Fibroid, uterine    H/O metrorrhagia    Hypertension    IUD (intrauterine device) in place 07/11/2015   Menorrhagia    Obesity, Class III, BMI 40-49.9 (morbid obesity) (HCC) 11/11/2018   Sleep apnea    uses CPAP - setting 11   SVT (supraventricular tachycardia)     Patient Active Problem List   Diagnosis Date Noted   Fibroids 01/26/2023   S/P hysterectomy 01/19/2023   Prediabetes 01/12/2023   Intractable chronic migraine with aura and without status migrainosus 12/20/2022   Iron deficiency anemia due to chronic blood loss 12/20/2022   Essential hypertension 11/01/2022   DUB (dysfunctional uterine bleeding) 11/01/2022   Not well controlled moderate persistent asthma 10/27/2022   Peripheral neuropathy 09/28/2022   Headache 07/01/2022   Screening for STD (sexually transmitted disease) 03/30/2022   Abdominal pain 03/02/2022   RUQ pain 02/23/2022   Abnormal abdominal CT scan 02/23/2022   Constipation  02/11/2022   Mild persistent asthma without complication 02/03/2022   Chronic urticaria 02/03/2022   Seasonal and perennial allergic rhinitis 02/03/2022   Anaphylactic shock due to adverse food reaction 02/03/2022   Need for immunization against influenza 01/27/2022   OSA (obstructive sleep apnea) 11/23/2021   Otitis media of left ear 10/26/2021   Chronic venous insufficiency of lower extremity 10/26/2021   Allergy 10/26/2021   Encounter for IUD removal    Menorrhagia 09/04/2020   Elevated troponin    Attempted IUD removal, unsuccessful 07/05/2019   Encounter for IUD insertion 07/05/2019   Gastroesophageal reflux disease    Nonspecific chest pain 11/11/2018   Morbid obesity (HCC) 11/11/2018   Elevated d-dimer 11/11/2018   SVT (supraventricular tachycardia) 10/04/2018   IUD (intrauterine device) in place 07/11/2015   Other disorder of menstruation and other abnormal bleeding from female genital tract 10/30/2012    Past Surgical History:  Procedure Laterality Date   DILATATION AND CURETTAGE/HYSTEROSCOPY WITH MINERVA N/A 12/01/2021   Procedure: DILATATION AND CURETTAGE/HYSTEROSCOPY WITH MINERVA;  Surgeon: Lazaro Arms, MD;  Location: [facility];  Service: Gynecology;  Laterality: N/A;   HYSTERECTOMY ABDOMINAL WITH SALPINGECTOMY Bilateral 01/19/2023   Procedure: HYSTERECTOMY ABDOMINAL WITH SALPINGECTOMY;  Surgeon: Lazaro Arms, MD;  Location: [facility];  Service: Gynecology;  Laterality: Bilateral;   HYSTEROSCOPY N/A 09/17/2020   Procedure: HYSTEROSCOPY;  Surgeon: Lazaro Arms, MD;  Location: [facility];  Service: Gynecology;  Laterality: N/A;   HYSTEROSCOPY WITH D & C N/A 09/27/2013   Procedure: DILATATION AND CURETTAGE /HYSTEROSCOPY and insertion of Mirena IUD ;  Surgeon: Freddrick March. Tenny Craw, MD;  Location: [facility];  Service: Gynecology;  Laterality: N/A;   IUD REMOVAL N/A 09/17/2020   Procedure: INTRAUTERINE DEVICE (IUD) REMOVAL (2);  Surgeon: Lazaro Arms, MD;  Location: [facility];  Service:  Gynecology;  Laterality: N/A;   LAPAROSCOPY N/A 01/19/2023   Procedure: LAPAROSCOPY DIAGNOSTIC;  Surgeon: Lazaro Arms, MD;  Location: [facility];  Service: Gynecology;  Laterality: N/A;   MYOMECTOMY  02/03/2011   Procedure: MYOMECTOMY;  Surgeon: Lazaro Arms, MD;  Location: [facility];  Service: Gynecology;  Laterality: N/A;   SVT ABLATION N/A 11/08/2018   Procedure: SVT ABLATION;  Surgeon: Marinus Maw, MD;  Location: [facility];  Service: Cardiovascular;  Laterality: N/A;   SVT ABLATION N/A 12/26/2020   Procedure: SVT ABLATION;  Surgeon: Marinus Maw, MD;  Location: [facility];  Service: Cardiovascular;  Laterality: N/A;   TONSILLECTOMY     TYMPANOSTOMY TUBE PLACEMENT      OB History     Gravida  1   Para      Term      Preterm      AB  1   Living         SAB  1   IAB      Ectopic      Multiple      Live Births               Home Medications    Prior to Admission medications   Medication Sig Start Date End Date Taking? Authorizing Provider  albuterol (VENTOLIN HFA) 108 (90 Base) MCG/ACT inhaler Inhale 2 puffs into the lungs every 6 (six) hours as needed for wheezing or shortness of breath. 10/26/21   Gilmore Laroche, FNP  amlodipine-olmesartan (AZOR) 10-20 MG tablet Take 1 tablet by mouth daily. 01/11/23   Gilmore Laroche, FNP  Blood Glucose Monitoring Suppl DEVI 1 each by Does not apply route in the morning, at noon, and at bedtime. May substitute to any manufacturer covered by patient's insurance. 01/11/23   Gilmore Laroche, FNP  fluticasone furoate-vilanterol (BREO ELLIPTA) 200-25 MCG/ACT AEPB Inhale 1 puff into the lungs daily. 10/27/22   Hetty Blend, FNP  gabapentin (NEURONTIN) 100 MG capsule Take 1 capsule (100 mg total) by mouth at bedtime. Patient taking differently: Take 100 mg by mouth daily as needed (pain). 09/28/22   Gilmore Laroche, FNP  Glucose Blood (BLOOD GLUCOSE TEST STRIPS) STRP 1 each by In Vitro route in the morning, at noon,  and at bedtime. May substitute to any manufacturer covered by patient's insurance. 01/11/23 02/13/23  Gilmore Laroche, FNP  ibuprofen (ADVIL) 600 MG tablet Take 1 tablet (600 mg total) by mouth every 6 (six) hours. 01/22/23   Lazaro Arms, MD  Lancet Device MISC 1 each by Does not apply route in the morning, at noon, and at bedtime. May substitute to any manufacturer covered by patient's insurance. 01/11/23 02/10/23  Gilmore Laroche, FNP  Lancets Misc. MISC 1 each by Does not apply route in the morning, at noon, and at bedtime. May substitute to any manufacturer covered by patient's insurance. 01/11/23 02/10/23  Gilmore Laroche, FNP  levocetirizine (XYZAL) 5 MG tablet Take 1 tablet (5 mg total) by mouth in the morning and at bedtime. 02/03/22   Hetty Blend,  FNP  metFORMIN (GLUCOPHAGE) 1000 MG tablet Take 0.5 tablets (500 mg total) by mouth 2 (two) times daily with a meal. 01/11/23   Gilmore Laroche, FNP  montelukast (SINGULAIR) 10 MG tablet Take 1 tablet (10 mg total) by mouth at bedtime. 10/27/22   Hetty Blend, FNP  Multiple Vitamins-Calcium (ONE-A-DAY WOMENS PO) Take 1 tablet by mouth daily.    [provider]  NON FORMULARY Pt uses a cpap nightly    [provider]  oxyCODONE (OXY IR/ROXICODONE) 5 MG immediate release tablet Take 1-2 tablets (5-10 mg total) by mouth every 4 (four) hours as needed for moderate pain. 01/22/23   Lazaro Arms, MD  Semaglutide,0.25 or 0.5MG /DOS, (OZEMPIC, 0.25 OR 0.5 MG/DOSE,) 2 MG/3ML SOPN Inject 0.25 mg into the skin once a week. Patient not taking: Reported on 01/27/2023 01/11/23   Gilmore Laroche, FNP  SUMAtriptan (IMITREX) 100 MG tablet Take 1 tablet (100 mg total) by mouth every 2 (two) hours as needed for migraine. May repeat in 2 hours if headache persists or recurs. 12/20/22   Anabel Halon, MD  Vitamin D, Ergocalciferol, (DRISDOL) 1.25 MG (50000 UNIT) CAPS capsule Take 1 capsule (50,000 Units total) by mouth every 7 (seven) days. 01/19/23    Gilmore Laroche, FNP  apixaban (ELIQUIS) 5 MG TABS tablet Take 2 tablets (10mg ) twice daily for 7 days, then 1 tablet (5mg ) twice daily 04/20/19 04/20/19  Michela Pitcher A, PA-C  levonorgestrel (MIRENA) 20 MCG/24HR IUD 1 each by Intrauterine route once.   06/25/20  [provider]    Family History Family History  Problem Relation Age of Onset   Cirrhosis Mother    Diabetes Mother    Cancer Maternal Grandfather    Hypertension Sister    Diabetes Sister    Anesthesia problems Neg Hx    Hypotension Neg Hx    Malignant hyperthermia Neg Hx    Pseudochol deficiency Neg Hx     Social History Social History   Tobacco Use   Smoking status: Never   Smokeless tobacco: Never   Tobacco comments:    socially  Vaping Use   Vaping status: Never Used  Substance Use Topics   Alcohol use: Yes    Comment: occasional   Drug use: Yes    Types: Marijuana    Comment: on occ     Allergies   Shellfish allergy, Sulfa antibiotics, and Adhesive [tape]   Review of Systems Review of Systems Per HPI  Physical Exam Triage Vital Signs ED Triage Vitals  Encounter Vitals Group     BP 01/31/23 1547 124/81     Systolic BP Percentile --      Diastolic BP Percentile --      Pulse Rate 01/31/23 1547 82     Resp 01/31/23 1547 18     Temp 01/31/23 1547 98.5 F (36.9 C)     Temp Source 01/31/23 1547 Oral     SpO2 01/31/23 1547 93 %     Weight --      Height --      Head Circumference --      Peak Flow --      Pain Score 01/31/23 1553 10     Pain Loc --      Pain Education --      Exclude from Growth Chart --    No data found.  Updated Vital Signs BP 124/81 (BP Location: Right Arm)   Pulse 82   Temp 98.5 F (36.9 C) (  Oral)   Resp 18   LMP 12/26/2022 (Approximate)   SpO2 93%   Visual Acuity Right Eye Distance:   Left Eye Distance:   Bilateral Distance:    Right Eye Near:   Left Eye Near:    Bilateral Near:     Physical Exam Vitals and nursing note reviewed.  HENT:      Head: Atraumatic.     Nose: Nose normal.     Mouth/Throat:     Mouth: Mucous membranes are moist.  Eyes:     Extraocular Movements: Extraocular movements intact.     Conjunctiva/sclera: Conjunctivae normal.  Cardiovascular:     Rate and Rhythm: Normal rate and regular rhythm.     Heart sounds: Normal heart sounds.  Pulmonary:     Effort: Pulmonary effort is normal.     Breath sounds: Normal breath sounds. No wheezing or rales.  Musculoskeletal:        General: Swelling and tenderness present. Normal range of motion.     Cervical back: Normal range of motion and neck supple.     Comments: 2+ edema to bilateral lower extremities, diffuse tenderness to palpation bilaterally Negative Homans' sign and squeeze test bilaterally  Skin:    General: Skin is warm and dry.     Findings: No bruising or erythema.  Neurological:     Mental Status: She is alert and oriented to person, place, and time.     Comments: Bilateral lower extremities neurovascularly intact  Psychiatric:        Mood and Affect: Mood normal.        Thought Content: Thought content normal.        Judgment: Judgment normal.      UC Treatments / Results  Labs (all labs ordered are listed, but only abnormal results are displayed) Labs Reviewed - No data to display  EKG   Radiology No results found.  Procedures Procedures (including critical care time)  Medications Ordered in UC Medications - No data to display  Initial Impression / Assessment and Plan / UC Course  I have reviewed the triage vital signs and the nursing notes.  Pertinent labs & imaging results that were available during my care of the patient were reviewed by me and considered in my medical decision making (see chart for details).     Vital signs benign and reassuring today, exam with no obvious abnormalities but discussed with patient that we are unable to rule out life-threatening causes of her symptoms in the setting.  We unfortunately  do not have x-ray today onsite but offered to patient chest x-ray and DVT orders to be placed to be done maybe tomorrow for further evaluation or going to the emergency department for immediate evaluation of her symptoms tonight.  She is agreeable to going to the emergency department and friend who is with her today plans to drive her via private vehicle.  They declined EMS transport and she is hemodynamically stable for transport.  Final Clinical Impressions(s) / UC Diagnoses   Final diagnoses:  Chest pain, unspecified type  SOB (shortness of breath)  Dizziness  Bilateral leg edema   Discharge Instructions   None    ED Prescriptions   None    PDMP not reviewed this encounter.   Particia Nearing, [place] 01/31/23 1807

## 2023-01-31 NOTE — ED Triage Notes (Signed)
Pt c/o pt having chest pain and sob started last night has gotten worse pt had hysterectomy x 2 wks ago.

## 2023-01-31 NOTE — ED Triage Notes (Signed)
Pt presents with right-sided CP with SOB, and dizziness that started last night, pt had hysterectomy 01/19/23.

## 2023-01-31 NOTE — ED Provider Notes (Signed)
[place] EMERGENCY DEPARTMENT AT [facility] Provider Note   CSN: [id number] Arrival date & time: 01/31/23  1642     History {Add pertinent medical, surgical, social history, OB history to HPI:1} Chief Complaint  Patient presents with   Chest Pain    Jessica Sanchez is a 37 y.o. female.  The history is provided by the patient.   Patient w/history of obesity, SVT, diabetes presents with chest pain.  Patient reports right upper chest pain that is pressure in sensation for the past day.  She also reports shortness of breath.  She has had vague intermittent dizziness.  No fevers or vomiting.  Patient had recent hysterectomy has had some mild postoperative discomfort but is overall healing well.  She also reports bilateral lower extremity swelling for several days.  She was seen in urgent care and sent for evaluation   Past Medical History:  Diagnosis Date   Angio-edema    Anxiety    Arthritis    right shoulder   Asthma    Diabetes mellitus without complication (HCC)    Dyspnea    Dysrhythmia    hx of SVT   Fibroid (bleeding) (uterine)    Fibroid, uterine    H/O metrorrhagia    Hypertension    IUD (intrauterine device) in place 07/11/2015   Menorrhagia    Obesity, Class III, BMI 40-49.9 (morbid obesity) (HCC) 11/11/2018   Sleep apnea    uses CPAP - setting 11   SVT (supraventricular tachycardia)     Home Medications Prior to Admission medications   Medication Sig Start Date End Date Taking? Authorizing Provider  albuterol (VENTOLIN HFA) 108 (90 Base) MCG/ACT inhaler Inhale 2 puffs into the lungs every 6 (six) hours as needed for wheezing or shortness of breath. 10/26/21  Yes Gilmore Laroche, FNP  amlodipine-olmesartan (AZOR) 10-20 MG tablet Take 1 tablet by mouth daily. 01/11/23  Yes Gilmore Laroche, FNP  fluticasone furoate-vilanterol (BREO ELLIPTA) 200-25 MCG/ACT AEPB Inhale 1 puff into the lungs daily. 10/27/22  Yes Ambs, Norvel Richards, FNP  gabapentin (NEURONTIN) 100  MG capsule Take 1 capsule (100 mg total) by mouth at bedtime. Patient taking differently: Take 100 mg by mouth daily as needed (pain). 09/28/22  Yes Gilmore Laroche, FNP  ibuprofen (ADVIL) 600 MG tablet Take 1 tablet (600 mg total) by mouth every 6 (six) hours. 01/22/23  Yes Lazaro Arms, MD  levocetirizine (XYZAL) 5 MG tablet Take 1 tablet (5 mg total) by mouth in the morning and at bedtime. 02/03/22  Yes Ambs, Norvel Richards, FNP  metFORMIN (GLUCOPHAGE) 1000 MG tablet Take 0.5 tablets (500 mg total) by mouth 2 (two) times daily with a meal. 01/11/23  Yes Gilmore Laroche, FNP  montelukast (SINGULAIR) 10 MG tablet Take 1 tablet (10 mg total) by mouth at bedtime. 10/27/22  Yes Ambs, Norvel Richards, FNP  Multiple Vitamins-Calcium (ONE-A-DAY WOMENS PO) Take 1 tablet by mouth daily.   Yes [provider]  NON FORMULARY Pt uses a cpap nightly   Yes [provider]  oxyCODONE (OXY IR/ROXICODONE) 5 MG immediate release tablet Take 1-2 tablets (5-10 mg total) by mouth every 4 (four) hours as needed for moderate pain. 01/22/23  Yes Lazaro Arms, MD  SUMAtriptan (IMITREX) 100 MG tablet Take 1 tablet (100 mg total) by mouth every 2 (two) hours as needed for migraine. May repeat in 2 hours if headache persists or recurs. 12/20/22  Yes Anabel Halon, MD  Vitamin D, Ergocalciferol, (DRISDOL) 1.25  MG (50000 UNIT) CAPS capsule Take 1 capsule (50,000 Units total) by mouth every 7 (seven) days. 01/19/23  Yes Gilmore Laroche, FNP  Blood Glucose Monitoring Suppl DEVI 1 each by Does not apply route in the morning, at noon, and at bedtime. May substitute to any manufacturer covered by patient's insurance. 01/11/23   Gilmore Laroche, FNP  Glucose Blood (BLOOD GLUCOSE TEST STRIPS) STRP 1 each by In Vitro route in the morning, at noon, and at bedtime. May substitute to any manufacturer covered by patient's insurance. 01/11/23 02/13/23  Gilmore Laroche, FNP  Lancet Device MISC 1 each by Does not apply route in the morning, at  noon, and at bedtime. May substitute to any manufacturer covered by patient's insurance. 01/11/23 02/10/23  Gilmore Laroche, FNP  Lancets Misc. MISC 1 each by Does not apply route in the morning, at noon, and at bedtime. May substitute to any manufacturer covered by patient's insurance. 01/11/23 02/10/23  Gilmore Laroche, FNP  Semaglutide,0.25 or 0.5MG /DOS, (OZEMPIC, 0.25 OR 0.5 MG/DOSE,) 2 MG/3ML SOPN Inject 0.25 mg into the skin once a week. Patient not taking: Reported on 01/27/2023 01/11/23   Gilmore Laroche, FNP  apixaban (ELIQUIS) 5 MG TABS tablet Take 2 tablets (10mg ) twice daily for 7 days, then 1 tablet (5mg ) twice daily 04/20/19 04/20/19  Michela Pitcher A, PA-C  levonorgestrel (MIRENA) 20 MCG/24HR IUD 1 each by Intrauterine route once.   06/25/20  [provider]      Allergies    Shellfish allergy, Sulfa antibiotics, and Adhesive [tape]    Review of Systems   Review of Systems  Constitutional:  Negative for fever.  Respiratory:  Positive for shortness of breath.   Cardiovascular:  Positive for chest pain and leg swelling.    Physical Exam Updated Vital Signs BP 136/73 (BP Location: Left Arm)   Pulse 81   Temp 98.2 F (36.8 C) (Oral)   Resp 16   Ht 1.575 m (5\' 2" )   Wt (!) 160.1 kg   LMP 12/26/2022 (Approximate)   SpO2 98%   BMI 64.56 kg/m  Physical Exam CONSTITUTIONAL: Well developed/well nourished HEAD: Normocephalic/atraumatic ENMT: Mucous membranes moist NECK: supple no meningeal signs CV: S1/S2 noted, no murmurs/rubs/gallops noted LUNGS: Lungs are clear to auscultation bilaterally, no apparent distress Chest - tender to palpation of right upper chest ABDOMEN: soft, nontender, obese, healing incisions NEURO: Pt is awake/alert/appropriate, moves all extremitiesx4.  No facial droop.   EXTREMITIES: pulses normal/equal, full ROM, symmetric pitting edema to bilateral lower extremities SKIN: warm, color normal  ED Results / Procedures / Treatments   Labs (all  labs ordered are listed, but only abnormal results are displayed) Labs Reviewed  CBC - Abnormal; Notable for the following components:      Result Value   Hemoglobin 10.5 (*)    HCT 33.9 (*)    All other components within normal limits  D-DIMER, QUANTITATIVE - Abnormal; Notable for the following components:   D-Dimer, Quant 1.34 (*)    All other components within normal limits  BASIC METABOLIC PANEL  TROPONIN I (HIGH SENSITIVITY)  TROPONIN I (HIGH SENSITIVITY)    EKG EKG Interpretation Date/Time:  Monday January 31 2023 16:52:40 EDT Ventricular Rate:  85 PR Interval:  142 QRS Duration:  78 QT Interval:  340 QTC Calculation: 404 R Axis:   -1  Text Interpretation: Normal sinus rhythm Cannot rule out Anterior infarct , age undetermined Abnormal ECG No significant change since last tracing Confirmed by Zadie Rhine ([id number]) on 01/31/2023 11:32:28  PM  Radiology DG Chest 2 View  Result Date: 01/31/2023 CLINICAL DATA:  Chest pain and shortness of breath. Hysterectomy 2 weeks ago. EXAM: CHEST - 2 VIEW COMPARISON:  Radiograph 12/29/2022 FINDINGS: Stable upper normal heart size. Unchanged mediastinal contours. No focal airspace disease, pleural effusion, pneumothorax or pulmonary edema. There may be slight central vascular congestion as before. IMPRESSION: Stable upper normal heart size. Possible slight central vascular congestion. Electronically Signed   By: Narda Rutherford M.D.   On: 01/31/2023 19:25    Procedures Procedures  {Document cardiac monitor, telemetry assessment procedure when appropriate:1}  Medications Ordered in ED Medications - No data to display  ED Course/ Medical Decision Making/ A&P   {   Click here for ABCD2, HEART and other calculatorsREFRESH Note before signing :1}                              Medical Decision Making Amount and/or Complexity of Data Reviewed Labs: ordered. Radiology: ordered.   This patient presents to the ED for concern of chest  pain, this involves an extensive number of treatment options, and is a complaint that carries with it a high risk of complications and morbidity.  The differential diagnosis includes but is not limited to acute coronary syndrome, aortic dissection, pulmonary embolism, pericarditis, pneumothorax, pneumonia, myocarditis, pleurisy, esophageal rupture    Comorbidities that complicate the patient evaluation: Patient's presentation is complicated by their history of SVT, obesity, recent surgery  Social Determinants of Health: Patient's  multiple ER visits   increases the complexity of managing their presentation  Additional history obtained: Records reviewed  urgent care records reviewed  Lab Tests: I Ordered, and personally interpreted labs.  The pertinent results include: Elevated D-dimer  Imaging Studies ordered: I ordered imaging studies including X-ray chest   I independently visualized and interpreted imaging which showed no acute findings I agree with the radiologist interpretation  Cardiac Monitoring: The patient was maintained on a cardiac monitor.  I personally viewed and interpreted the cardiac monitor which showed an underlying rhythm of:  {cardiac monitor:26849}  Medicines ordered and prescription drug management: I ordered medication including ***  for ***  Reevaluation of the patient after these medicines showed that the patient    {resolved/improved/worsened:23923::"improved"}  Test Considered: Patient is low risk / negative by ***, therefore do not feel that *** is indicated.  Critical Interventions:  ***  Consultations Obtained: I requested consultation with the {consultation:26851}, and discussed  findings as well as pertinent plan - they recommend: ***  Reevaluation: After the interventions noted above, I reevaluated the patient and found that they have :{resolved/improved/worsened:23923::"improved"}  Complexity of problems addressed: Patient's presentation is  most consistent with  {WRUE:45409}  Disposition: After consideration of the diagnostic results and the patient's response to treatment,  I feel that the patent would benefit from {disposition:26850}.     {Document critical care time when appropriate:1} {Document review of labs and clinical decision tools ie heart score, Chads2Vasc2 etc:1}  {Document your independent review of radiology images, and any outside records:1} {Document your discussion with family members, caretakers, and with consultants:1} {Document social determinants of health affecting pt's care:1} {Document your decision making why or why not admission, treatments were needed:1} Final Clinical Impression(s) / ED Diagnoses Final diagnoses:  None    Rx / DC Orders ED Discharge Orders     None

## 2023-01-31 NOTE — Telephone Encounter (Signed)
Patient calling having chest pains, post hysterctomy, teams message sent to clinic.  I have Jessica Sanchez on the line complaining of chest pains, post  hysterectomy* [name] pt *- parked [phone]     [place] [phone]

## 2023-01-31 NOTE — Telephone Encounter (Signed)
Spoke to pt, advised for her to call 911, emt can evaluate and will let her know if she needs to go to ER, states pain is on right side of her chest.

## 2023-01-31 NOTE — ED Notes (Signed)
Patient is being discharged from the Urgent Care and sent to the Emergency Department via POV . Per Roosvelt Maser PA-C, patient is in need of higher level of care due to Chest pain and SOB,  x 2 weeks post op. Patient is aware and verbalizes understanding of plan of care.  Vitals:   01/31/23 1547  BP: 124/81  Pulse: 82  Resp: 18  Temp: 98.5 F (36.9 C)  SpO2: 93%

## 2023-02-01 DIAGNOSIS — R079 Chest pain, unspecified: Secondary | ICD-10-CM | POA: Diagnosis not present

## 2023-02-01 DIAGNOSIS — R0602 Shortness of breath: Secondary | ICD-10-CM | POA: Diagnosis not present

## 2023-02-01 DIAGNOSIS — J45909 Unspecified asthma, uncomplicated: Secondary | ICD-10-CM | POA: Diagnosis not present

## 2023-02-01 NOTE — ED Notes (Signed)
Patient transported to CT 

## 2023-02-02 ENCOUNTER — Ambulatory Visit (INDEPENDENT_AMBULATORY_CARE_PROVIDER_SITE_OTHER): Payer: No Typology Code available for payment source | Admitting: Allergy & Immunology

## 2023-02-02 ENCOUNTER — Other Ambulatory Visit: Payer: Self-pay

## 2023-02-02 ENCOUNTER — Telehealth: Payer: Self-pay | Admitting: *Deleted

## 2023-02-02 ENCOUNTER — Encounter: Payer: Self-pay | Admitting: Allergy & Immunology

## 2023-02-02 VITALS — BP 112/62 | HR 71 | Temp 98.0°F | Ht 62.0 in | Wt 351.0 lb

## 2023-02-02 DIAGNOSIS — J302 Other seasonal allergic rhinitis: Secondary | ICD-10-CM

## 2023-02-02 DIAGNOSIS — J4540 Moderate persistent asthma, uncomplicated: Secondary | ICD-10-CM | POA: Diagnosis not present

## 2023-02-02 DIAGNOSIS — L508 Other urticaria: Secondary | ICD-10-CM

## 2023-02-02 DIAGNOSIS — T7800XD Anaphylactic reaction due to unspecified food, subsequent encounter: Secondary | ICD-10-CM

## 2023-02-02 DIAGNOSIS — J3089 Other allergic rhinitis: Secondary | ICD-10-CM | POA: Diagnosis not present

## 2023-02-02 MED ORDER — TRELEGY ELLIPTA 200-62.5-25 MCG/ACT IN AEPB: 1.0000 | INHALATION_SPRAY | Freq: Every day | RESPIRATORY_TRACT | 5 refills | Status: DC

## 2023-02-02 MED ORDER — LEVOCETIRIZINE DIHYDROCHLORIDE 5 MG PO TABS: 5.0000 mg | ORAL_TABLET | Freq: Two times a day (BID) | ORAL | 5 refills | Status: DC

## 2023-02-02 MED ORDER — FAMOTIDINE 40 MG PO TABS: 40.0000 mg | ORAL_TABLET | Freq: Two times a day (BID) | ORAL | 5 refills | Status: DC

## 2023-02-02 NOTE — Patient Instructions (Addendum)
1. Chronic urticaria - You did have several items on testing that would explain your urticaria (hives) and itching. - We will defer on the lab work since you have the outstanding bill with [facility].  - In the meantime, start suppressive dosing of antihistamines:   - Morning: Xyzal (levocetirizine) 5mg  + Pepcid 40mg   - Evening: Xyzal (levocetirizine) 5mg  + Pepcid 40mg  - You can change this dosing at home, decreasing the dose as needed or increasing the dosing as needed.  - Xolair consent signed today (we will work on getting this approved). - Tammy from [employer] [facility] will reach out regarding the approval process.   2. Seasonal and perennial allergic rhinitis - Previous testing showed: grasses, ragweed, weeds, trees, indoor molds, outdoor molds, dust mites, cat, dog, cockroach, and tobacco . - Copy of test results provided.  - Avoidance measures provided. - Continue taking: antihistamines as above for the hives - Consider nasal saline rinses 1-2 times daily to remove allergens from the nasal cavities as well as help with mucous clearance (this is especially helpful to do before the nasal sprays are given) - Consider allergy shots as a means of long-term control.  3. Anaphylactic shock due to food (seafood) - Testing was negative to the most common foods. - Since you have eaten shrimp since the testing without a problem. I would just put shellfish back into your diet.    4. Mild persistent asthma, uncomplicated - Lung testing was a bit lower but this is likely related to your recent surgery. - The addition of the Xolair will help with your breathing as well.  - Stop the Breo and start Trelegy one puff once daily (sample and copay card provided).  - Daily controller medication(s): Trelegy one puff once daily - Prior to physical activity: albuterol 2 puffs 10-15 minutes before physical activity. - Rescue medications: albuterol 4 puffs every 4-6 hours as needed - Asthma control goals:  *  Full participation in all desired activities (may need albuterol before activity) * Albuterol use two time or less a week on average (not counting use with activity) * Cough interfering with sleep two time or less a month * Oral steroids no more than once a year * No hospitalizations  5. Return in about 3 months (around 05/05/2023). You can have the follow up appointment with Dr. Dellis Anes or a Nurse Practicioner (our Nurse Practitioners are excellent and always have Physician oversight!).    Please inform us of any Emergency Department visits, hospitalizations, or changes in symptoms. Call us before going to the ED for breathing or allergy symptoms since we might be able to fit you in for a sick visit. Feel free to contact us anytime with any questions, problems, or concerns.  It was a pleasure to see you again today!  Websites that have reliable patient information: 1. American Academy of Asthma, Allergy, and Immunology: www.aaaai.org 2. Food Allergy Research and Education (FARE): foodallergy.org 3. Mothers of Asthmatics: http://www.asthmacommunitynetwork.org 4. American College of Allergy, Asthma, and Immunology: www.acaai.org   COVID-19 Vaccine Information can be found at: [email or web address] For questions related to vaccine distribution or appointments, please email [email or web address] or call [phone].     "Like" [place] on Facebook and Instagram for our latest updates!      A healthy democracy works best when [employer] participate! Make sure you are registered to vote! If you have moved or changed any of your contact information, you will need to get this updated before voting! Scan the QR codes  below to learn more!

## 2023-02-02 NOTE — Progress Notes (Signed)
FOLLOW UP  Date of Service/Encounter:  02/02/23   Assessment:   Mild intermittent asthma, uncomplicated   Chronic urticaria - submitting for Xolair   Seasonal and perennial allergic rhinitis (grasses, ragweed, weeds, trees, indoor molds, outdoor molds, dust mites, cat, dog, cockroach, and tobacco)   Anaphylactic shock due to food (shellfish) - with negative skin testing and has tolerated shrimp since that time   Plan/Recommendations:   1. Chronic urticaria - You did have several items on testing that would explain your urticaria (hives) and itching. - We will defer on the lab work since you have the outstanding bill with [facility].  - In the meantime, start suppressive dosing of antihistamines:   - Morning: Xyzal (levocetirizine) 5mg  + Pepcid 40mg   - Evening: Xyzal (levocetirizine) 5mg  + Pepcid 40mg  - You can change this dosing at home, decreasing the dose as needed or increasing the dosing as needed.  - Xolair consent signed today (we will work on getting this approved). - Tammy from [facility] will reach out regarding the approval process.   2. Seasonal and perennial allergic rhinitis - Previous testing showed: grasses, ragweed, weeds, trees, indoor molds, outdoor molds, dust mites, cat, dog, cockroach, and tobacco . - Copy of test results provided.  - Avoidance measures provided. - Continue taking: antihistamines as above for the hives - Consider nasal saline rinses 1-2 times daily to remove allergens from the nasal cavities as well as help with mucous clearance (this is especially helpful to do before the nasal sprays are given) - Consider allergy shots as a means of long-term control.  3. Anaphylactic shock due to food (seafood) - Testing was negative to the most common foods. - Since you have eaten shrimp since the testing without a problem. I would just put shellfish back into your diet.    4. Mild persistent asthma, uncomplicated - Lung testing was a bit lower but this  is likely related to your recent surgery. - The addition of the Xolair will help with your breathing as well.  - Stop the Breo and start Trelegy one puff once daily (sample and copay card provided).  - Daily controller medication(s): Trelegy one puff once daily - Prior to physical activity: albuterol 2 puffs 10-15 minutes before physical activity. - Rescue medications: albuterol 4 puffs every 4-6 hours as needed - Asthma control goals:  * Full participation in all desired activities (may need albuterol before activity) * Albuterol use two time or less a week on average (not counting use with activity) * Cough interfering with sleep two time or less a month * Oral steroids no more than once a year * No hospitalizations  5. Return in about 3 months (around 05/05/2023). You can have the follow up appointment with Dr. Dellis Anes or a Nurse Practicioner (our Nurse Practitioners are excellent and always have Physician oversight!).    Subjective:   Jessica Sanchez is a 37 y.o. female presenting today for follow up of  Chief Complaint  Patient presents with   Follow-up    Wants to know if she has develop any new allergies    Pruritus    Jessica Sanchez has a history of the following: Patient Active Problem List   Diagnosis Date Noted   Fibroids 01/26/2023   S/P hysterectomy 01/19/2023   Prediabetes 01/12/2023   Intractable chronic migraine with aura and without status migrainosus 12/20/2022   Iron deficiency anemia due to chronic blood loss 12/20/2022   Essential hypertension 11/01/2022  DUB (dysfunctional uterine bleeding) 11/01/2022   Not well controlled moderate persistent asthma 10/27/2022   Peripheral neuropathy 09/28/2022   Headache 07/01/2022   Screening for STD (sexually transmitted disease) 03/30/2022   Abdominal pain 03/02/2022   RUQ pain 02/23/2022   Abnormal abdominal CT scan 02/23/2022   Constipation 02/11/2022   Mild persistent asthma without complication 02/03/2022    Chronic urticaria 02/03/2022   Seasonal and perennial allergic rhinitis 02/03/2022   Anaphylactic shock due to adverse food reaction 02/03/2022   Need for immunization against influenza 01/27/2022   OSA (obstructive sleep apnea) 11/23/2021   Otitis media of left ear 10/26/2021   Chronic venous insufficiency of lower extremity 10/26/2021   Allergy 10/26/2021   Encounter for IUD removal    Menorrhagia 09/04/2020   Elevated troponin    Attempted IUD removal, unsuccessful 07/05/2019   Encounter for IUD insertion 07/05/2019   Gastroesophageal reflux disease    Nonspecific chest pain 11/11/2018   Morbid obesity (HCC) 11/11/2018   Elevated d-dimer 11/11/2018   SVT (supraventricular tachycardia) (HCC) 10/04/2018   IUD (intrauterine device) in place 07/11/2015   Other disorder of menstruation and other abnormal bleeding from female genital tract 10/30/2012    History obtained from: chart review and patient.  Discussed the use of AI scribe software for clinical note transcription with the patient, who gave verbal consent to proceed.  Jessica Sanchez is a 37 y.o. female presenting for a follow up visit.  She was last seen in June 2024 by Thurston Hole one of our nurse practitioners.  At that time, she was started on Breo 200 mcg 1 puff once daily to replace her lower dose Breo.  She was continued on montelukast 10 mg daily as well as Xopenex as needed.  For her allergic rhinitis, she was continued on cetirizine as well as montelukast and the Flonase.  Her hives were under decent control with suppressive doses of antihistamines.  She continue to avoid shellfish.  Since the last visit, she has had an eventful course unfortunately. The patient recently underwent a hysterectomy, which was more complicated than expected due to the need for a tumor removal. The procedure was initially planned to be performed robotically, but ultimately required an open approach. The patient is still in the recovery phase from this  surgery. She is out of work for 6 weeks and is planning to take the entire time.   Asthma/Respiratory Symptom History: The patient has a history of asthma, which has been managed with Breo. However, the effectiveness of this treatment is unclear due to the patient's fluctuating breathing patterns, which they attribute, in part, to their weight.  Allergic Rhinitis Symptom History: Allergic rhinitis symptoms are under good control with the current antihistamines. She does not use a nasal spray on a routine basis. She has not been on antibiotics at all for her symptoms.   Food Allergy Symptom History: The patient also has a known shellfish allergy, which has caused reactions in the past. However, they report having consumed a dish with shrimp recently without any adverse effects. Despite this, the patient remains cautious about consuming shellfish.  Skin Symptom History: The patient presents with a persistent issue of itching, which has been a lifelong problem. They report the development of hives since the last visit, although these are described as smaller and less severe than previous episodes. The itching is generalized, affecting various parts of the body, including the back. Despite the ongoing discomfort, the patient has not been taking Zyrtec, but has been  on prescribed Xyzal and famotidine twice daily. This does take the edge off. She never has gotten the labs done because she owes [facility] money and they would not do her labs without her making a payment.  The patient has been prescribed Singulair or montelukast in the past, but it is unclear if this medication is currently being taken.   Otherwise, there have been no changes to her past medical history, surgical history, family history, or social history.    Review of systems otherwise negative other than that mentioned in the HPI.    Objective:   Blood pressure 112/62, pulse 71, temperature 98 F (36.7 C), height 5\' 2"  (1.575 m), weight  (!) 351 lb (159.2 kg), last menstrual period 12/26/2022, SpO2 97%. Body mass index is 64.2 kg/m.    Physical Exam Vitals reviewed.  Constitutional:      Appearance: She is well-developed. She is obese.     Comments: Seems to be in pain.   HENT:     Head: Normocephalic and atraumatic.     Right Ear: Tympanic membrane, ear canal and external ear normal. No drainage, swelling or tenderness. Tympanic membrane is not injected, scarred, erythematous, retracted or bulging.     Left Ear: Tympanic membrane, ear canal and external ear normal. No drainage, swelling or tenderness. Tympanic membrane is not injected, scarred, erythematous, retracted or bulging.     Nose: Mucosal edema and rhinorrhea present. No nasal deformity or septal deviation.     Right Turbinates: Enlarged, swollen and pale.     Left Turbinates: Enlarged, swollen and pale.     Right Sinus: No maxillary sinus tenderness or frontal sinus tenderness.     Left Sinus: No maxillary sinus tenderness or frontal sinus tenderness.     Mouth/Throat:     Mouth: Mucous membranes are not pale and not dry.     Pharynx: Uvula midline.  Eyes:     General:        Right eye: No discharge.        Left eye: No discharge.     Conjunctiva/sclera: Conjunctivae normal.     Right eye: Right conjunctiva is not injected. No chemosis.    Left eye: Left conjunctiva is not injected. No chemosis.    Pupils: Pupils are equal, round, and reactive to light.  Cardiovascular:     Rate and Rhythm: Normal rate and regular rhythm.     Heart sounds: Normal heart sounds.  Pulmonary:     Effort: Pulmonary effort is normal. No tachypnea, accessory muscle usage or respiratory distress.     Breath sounds: Normal breath sounds. No wheezing, rhonchi or rales.  Chest:     Chest wall: No tenderness.  Abdominal:     Tenderness: There is no abdominal tenderness. There is no guarding or rebound.  Lymphadenopathy:     Head:     Right side of head: No submandibular,  tonsillar or occipital adenopathy.     Left side of head: No submandibular, tonsillar or occipital adenopathy.     Cervical: No cervical adenopathy.  Skin:    General: Skin is warm.     Capillary Refill: Capillary refill takes less than 2 seconds.     Coloration: Skin is not pale.     Findings: Rash present. No abrasion, erythema, lesion or petechiae. Rash is not papular, urticarial or vesicular.     Comments: Excoriations present especially on the lower back bilaterally. There are no urticaria present at all.   Neurological:  Mental Status: She is alert.  Psychiatric:        Behavior: Behavior is cooperative.      Diagnostic studies:    Spirometry: results abnormal (FEV1: 1.68/68%, FVC: 2.15/72%, FEV1/FVC: 78%).    Spirometry consistent with possible restrictive disease.   Allergy Studies: none        [name], MD  [facility]

## 2023-02-02 NOTE — Telephone Encounter (Signed)
-----   Message from Jessica Sanchez sent at 02/02/2023 12:12 PM EDT ----- Starting [facility] for [facility].

## 2023-02-02 NOTE — Telephone Encounter (Signed)
Called patient and advised I need copy of her current ins card she will email to me

## 2023-02-03 ENCOUNTER — Encounter: Payer: Self-pay | Admitting: Obstetrics & Gynecology

## 2023-02-03 ENCOUNTER — Ambulatory Visit (INDEPENDENT_AMBULATORY_CARE_PROVIDER_SITE_OTHER): Payer: No Typology Code available for payment source | Admitting: Obstetrics & Gynecology

## 2023-02-03 VITALS — BP 130/82 | HR 77 | Ht 62.0 in | Wt 349.0 lb

## 2023-02-03 DIAGNOSIS — Z9889 Other specified postprocedural states: Secondary | ICD-10-CM

## 2023-02-03 MED ORDER — CYCLOBENZAPRINE HCL 10 MG PO TABS: 10.0000 mg | ORAL_TABLET | Freq: Three times a day (TID) | ORAL | 1 refills | Status: DC | PRN

## 2023-02-03 NOTE — Progress Notes (Signed)
HPI: Patient returns for routine postoperative follow-up having undergone TAH + BS  on 01/19/23.  The patient's immediate postoperative recovery has been unremarkable. Since hospital discharge the patient reports overall doing well progressin normally.   Current Outpatient Medications: albuterol (VENTOLIN HFA) 108 (90 Base) MCG/ACT inhaler, Inhale 2 puffs into the lungs every 6 (six) hours as needed for wheezing or shortness of breath., Disp: 8 g, Rfl: 0 amlodipine-olmesartan (AZOR) 10-20 MG tablet, Take 1 tablet by mouth daily., Disp: 30 tablet, Rfl: 1 Blood Glucose Monitoring Suppl DEVI, 1 each by Does not apply route in the morning, at noon, and at bedtime. May substitute to any manufacturer covered by patient's insurance., Disp: 1 each, Rfl: 0 cyclobenzaprine (FLEXERIL) 10 MG tablet, Take 1 tablet (10 mg total) by mouth every 8 (eight) hours as needed for muscle spasms., Disp: 30 tablet, Rfl: 1 famotidine (PEPCID) 40 MG tablet, Take 1 tablet (40 mg total) by mouth in the morning and at bedtime., Disp: 60 tablet, Rfl: 5 Fluticasone-Umeclidin-Vilant (TRELEGY ELLIPTA) 200-62.5-25 MCG/ACT AEPB, Inhale 1 puff into the lungs daily., Disp: 28 each, Rfl: 5 gabapentin (NEURONTIN) 100 MG capsule, Take 1 capsule (100 mg total) by mouth at bedtime. (Patient taking differently: Take 100 mg by mouth daily as needed (pain).), Disp: 90 capsule, Rfl: 1 Glucose Blood (BLOOD GLUCOSE TEST STRIPS) STRP, 1 each by In Vitro route in the morning, at noon, and at bedtime. May substitute to any manufacturer covered by patient's insurance., Disp: 100 strip, Rfl: 0 ibuprofen (ADVIL) 600 MG tablet, Take 1 tablet (600 mg total) by mouth every 6 (six) hours., Disp: 30 tablet, Rfl: 0 Lancet Device MISC, 1 each by Does not apply route in the morning, at noon, and at bedtime. May substitute to any manufacturer covered by patient's insurance., Disp: 1 each, Rfl: 0 Lancets Misc. MISC, 1 each by Does not apply route in the  morning, at noon, and at bedtime. May substitute to any manufacturer covered by patient's insurance., Disp: 100 each, Rfl: 0 levocetirizine (XYZAL) 5 MG tablet, Take 1 tablet (5 mg total) by mouth in the morning and at bedtime., Disp: 60 tablet, Rfl: 5 metFORMIN (GLUCOPHAGE) 1000 MG tablet, Take 0.5 tablets (500 mg total) by mouth 2 (two) times daily with a meal., Disp: 180 tablet, Rfl: 3 montelukast (SINGULAIR) 10 MG tablet, Take 1 tablet (10 mg total) by mouth at bedtime., Disp: 30 tablet, Rfl: 2 Multiple Vitamins-Calcium (ONE-A-DAY WOMENS PO), Take 1 tablet by mouth daily., Disp: , Rfl:  NON FORMULARY, Pt uses a cpap nightly, Disp: , Rfl:  oxyCODONE (OXY IR/ROXICODONE) 5 MG immediate release tablet, Take 1-2 tablets (5-10 mg total) by mouth every 4 (four) hours as needed for moderate pain., Disp: 30 tablet, Rfl: 0 SUMAtriptan (IMITREX) 100 MG tablet, Take 1 tablet (100 mg total) by mouth every 2 (two) hours as needed for migraine. May repeat in 2 hours if headache persists or recurs., Disp: 10 tablet, Rfl: 2 Vitamin D, Ergocalciferol, (DRISDOL) 1.25 MG (50000 UNIT) CAPS capsule, Take 1 capsule (50,000 Units total) by mouth every 7 (seven) days., Disp: 20 capsule, Rfl: 1 Semaglutide,0.25 or 0.5MG /DOS, (OZEMPIC, 0.25 OR 0.5 MG/DOSE,) 2 MG/3ML SOPN, Inject 0.25 mg into the skin once a week. (Patient not taking: Reported on 01/27/2023), Disp: 3 mL, Rfl: 0  No current facility-administered medications for this visit.    Blood pressure 130/82, pulse 77, height 5\' 2"  (1.575 m), weight (!) 349 lb (158.3 kg), last menstrual period 12/26/2022.  Physical Exam: Incision is clean  dry intact Continue local care 1/2 staples removed today, other half in 5 days time  Diagnostic Tests:   Pathology: benign  Impression + Management plan:   ICD-10-CM   1. Post-operative state: TAH + BS 01/19/23  Z98.890          Medications Prescribed this encounter: Orders Placed This Encounter      cyclobenzaprine (FLEXERIL) 10 MG tablet         Sig: Take 1 tablet (10 mg total) by mouth every 8 (eight) hours as needed for muscle spasms.         Dispense:  30 tablet         Refill:  1  For back spasm   Follow up: Return in about 5 days (around 02/08/2023) for Follow up, Post Op, with Dr Despina Hidden.    Lazaro Arms, MD Attending Physician for the Center for [facility] and [facility] 02/03/2023 11:23 AM

## 2023-02-07 ENCOUNTER — Telehealth: Payer: Self-pay | Admitting: Obstetrics & Gynecology

## 2023-02-07 NOTE — Telephone Encounter (Signed)
Patient had blood in her bowel movement. She wants to know if that may be something from her surgery or does she need to see her pcp for that. Please advise.

## 2023-02-07 NOTE — Telephone Encounter (Signed)
Spoke with patient, notice red blood when wiping after bowel movement. Advised that she may have an internal hemorrhoid. Pt already has appointment here tomorrow. Will discuss more with Dr. Despina Hidden then.

## 2023-02-08 ENCOUNTER — Ambulatory Visit: Payer: No Typology Code available for payment source | Admitting: Obstetrics & Gynecology

## 2023-02-08 VITALS — BP 128/76 | HR 75

## 2023-02-08 DIAGNOSIS — Z9889 Other specified postprocedural states: Secondary | ICD-10-CM

## 2023-02-08 NOTE — Progress Notes (Signed)
HPI: Patient returns for routine postoperative follow-up having undergone TAH BS on 01/19/23.  The patient's immediate postoperative recovery has been unremarkable. Since hospital discharge the patient reports no problems some spotting with BM.   Current Outpatient Medications: albuterol (VENTOLIN HFA) 108 (90 Base) MCG/ACT inhaler, Inhale 2 puffs into the lungs every 6 (six) hours as needed for wheezing or shortness of breath., Disp: 8 g, Rfl: 0 amlodipine-olmesartan (AZOR) 10-20 MG tablet, Take 1 tablet by mouth daily., Disp: 30 tablet, Rfl: 1 Blood Glucose Monitoring Suppl DEVI, 1 each by Does not apply route in the morning, at noon, and at bedtime. May substitute to any manufacturer covered by patient's insurance., Disp: 1 each, Rfl: 0 cyclobenzaprine (FLEXERIL) 10 MG tablet, Take 1 tablet (10 mg total) by mouth every 8 (eight) hours as needed for muscle spasms., Disp: 30 tablet, Rfl: 1 famotidine (PEPCID) 40 MG tablet, Take 1 tablet (40 mg total) by mouth in the morning and at bedtime., Disp: 60 tablet, Rfl: 5 Fluticasone-Umeclidin-Vilant (TRELEGY ELLIPTA) 200-62.5-25 MCG/ACT AEPB, Inhale 1 puff into the lungs daily., Disp: 28 each, Rfl: 5 gabapentin (NEURONTIN) 100 MG capsule, Take 1 capsule (100 mg total) by mouth at bedtime. (Patient taking differently: Take 100 mg by mouth daily as needed (pain).), Disp: 90 capsule, Rfl: 1 Glucose Blood (BLOOD GLUCOSE TEST STRIPS) STRP, 1 each by In Vitro route in the morning, at noon, and at bedtime. May substitute to any manufacturer covered by patient's insurance., Disp: 100 strip, Rfl: 0 ibuprofen (ADVIL) 600 MG tablet, Take 1 tablet (600 mg total) by mouth every 6 (six) hours., Disp: 30 tablet, Rfl: 0 Lancet Device MISC, 1 each by Does not apply route in the morning, at noon, and at bedtime. May substitute to any manufacturer covered by patient's insurance., Disp: 1 each, Rfl: 0 Lancets Misc. MISC, 1 each by Does not apply route in the morning, at  noon, and at bedtime. May substitute to any manufacturer covered by patient's insurance., Disp: 100 each, Rfl: 0 levocetirizine (XYZAL) 5 MG tablet, Take 1 tablet (5 mg total) by mouth in the morning and at bedtime., Disp: 60 tablet, Rfl: 5 metFORMIN (GLUCOPHAGE) 1000 MG tablet, Take 0.5 tablets (500 mg total) by mouth 2 (two) times daily with a meal., Disp: 180 tablet, Rfl: 3 montelukast (SINGULAIR) 10 MG tablet, Take 1 tablet (10 mg total) by mouth at bedtime., Disp: 30 tablet, Rfl: 2 Multiple Vitamins-Calcium (ONE-A-DAY WOMENS PO), Take 1 tablet by mouth daily., Disp: , Rfl:  NON FORMULARY, Pt uses a cpap nightly, Disp: , Rfl:  oxyCODONE (OXY IR/ROXICODONE) 5 MG immediate release tablet, Take 1-2 tablets (5-10 mg total) by mouth every 4 (four) hours as needed for moderate pain., Disp: 30 tablet, Rfl: 0 SUMAtriptan (IMITREX) 100 MG tablet, Take 1 tablet (100 mg total) by mouth every 2 (two) hours as needed for migraine. May repeat in 2 hours if headache persists or recurs., Disp: 10 tablet, Rfl: 2 Vitamin D, Ergocalciferol, (DRISDOL) 1.25 MG (50000 UNIT) CAPS capsule, Take 1 capsule (50,000 Units total) by mouth every 7 (seven) days., Disp: 20 capsule, Rfl: 1 Semaglutide,0.25 or 0.5MG /DOS, (OZEMPIC, 0.25 OR 0.5 MG/DOSE,) 2 MG/3ML SOPN, Inject 0.25 mg into the skin once a week. (Patient not taking: Reported on 01/27/2023), Disp: 3 mL, Rfl: 0  No current facility-administered medications for this visit.    Blood pressure 128/76, pulse 75, last menstrual period 12/26/2022.  Physical Exam: Incision clean dry intact Other 1/2 of staples removed Gentian violet placed  Diagnostic Tests:  Pathology: benign  Impression + Management plan:   ICD-10-CM   1. Post-operative state: TAH + BS 01/19/23  Z98.890          Medications Prescribed this encounter: No orders of the defined types were placed in this encounter.     Follow up: Return in about 2 weeks (around 02/22/2023) for Post Op,  with Dr Despina Hidden.    Lazaro Arms, MD Attending Physician for the Center for [facility] and [facility] 02/08/2023 11:07 AM

## 2023-02-11 ENCOUNTER — Ambulatory Visit (INDEPENDENT_AMBULATORY_CARE_PROVIDER_SITE_OTHER): Payer: No Typology Code available for payment source | Admitting: Family Medicine

## 2023-02-11 ENCOUNTER — Encounter: Payer: Self-pay | Admitting: Family Medicine

## 2023-02-11 DIAGNOSIS — Z6841 Body Mass Index (BMI) 40.0 and over, adult: Secondary | ICD-10-CM

## 2023-02-11 DIAGNOSIS — I10 Essential (primary) hypertension: Secondary | ICD-10-CM

## 2023-02-11 MED ORDER — WEGOVY 0.25 MG/0.5ML ~~LOC~~ SOAJ: 0.2500 mg | SUBCUTANEOUS | 0 refills | Status: DC

## 2023-02-11 NOTE — Progress Notes (Addendum)
Established Patient Office Visit  Subjective:  Patient ID: Jessica Sanchez, female    DOB: 1985/11/04  Age: 37 y.o. MRN: [id number]  CC:  Chief Complaint  Patient presents with   Follow-up    B/P swelling in legs mainly the rt. Leg. Waiting on approval from [facility] for wt. Loss medication.    HPI Jessica Sanchez is a 37 y.o. female presents for BP f/u.  Hypertension: The patient reports compliance with her medication regimen, taking amlodipine-olmesartan 10-20 mg daily. She denies experiencing headaches, dizziness, blurred vision, chest pain, or palpitations. However, she does complain of lower extremity edema, which she attributes to prolonged periods of sitting. The patient had a total hysterectomy on January 19, 2023, but denies any symptoms of chest pain, palpitations, or shortness of breath. She reports no pain while ambulating.  Obesity: The patient is classified as prediabetic and is not a candidate for Ozempic. Instead, she will start on Wegovy 0.25 mg weekly, with a recommendation to request monthly medication refills for dose adjustments. The patient denies any history of medullary thyroid carcinoma or pancreatitis.   Past Medical History:  Diagnosis Date   Angio-edema    Anxiety    Arthritis    right shoulder   Asthma    Diabetes mellitus without complication (HCC)    Dyspnea    Dysrhythmia    hx of SVT   Fibroid (bleeding) (uterine)    Fibroid, uterine    H/O metrorrhagia    Hypertension    IUD (intrauterine device) in place 07/11/2015   Menorrhagia    Obesity, Class III, BMI 40-49.9 (morbid obesity) (HCC) 11/11/2018   Sleep apnea    uses CPAP - setting 11   SVT (supraventricular tachycardia) (HCC)     Past Surgical History:  Procedure Laterality Date   DILATATION AND CURETTAGE/HYSTEROSCOPY WITH MINERVA N/A 12/01/2021   Procedure: DILATATION AND CURETTAGE/HYSTEROSCOPY WITH MINERVA;  Surgeon: Lazaro Arms, MD;  Location: [facility];  Service: Gynecology;   Laterality: N/A;   HYSTERECTOMY ABDOMINAL WITH SALPINGECTOMY Bilateral 01/19/2023   Procedure: HYSTERECTOMY ABDOMINAL WITH SALPINGECTOMY;  Surgeon: Lazaro Arms, MD;  Location: [facility];  Service: Gynecology;  Laterality: Bilateral;   HYSTEROSCOPY N/A 09/17/2020   Procedure: HYSTEROSCOPY;  Surgeon: Lazaro Arms, MD;  Location: [facility];  Service: Gynecology;  Laterality: N/A;   HYSTEROSCOPY WITH D & C N/A 09/27/2013   Procedure: DILATATION AND CURETTAGE /HYSTEROSCOPY and insertion of Mirena IUD ;  Surgeon: Freddrick March. Tenny Craw, MD;  Location: [facility];  Service: Gynecology;  Laterality: N/A;   IUD REMOVAL N/A 09/17/2020   Procedure: INTRAUTERINE DEVICE (IUD) REMOVAL (2);  Surgeon: Lazaro Arms, MD;  Location: [facility];  Service: Gynecology;  Laterality: N/A;   LAPAROSCOPY N/A 01/19/2023   Procedure: LAPAROSCOPY DIAGNOSTIC;  Surgeon: Lazaro Arms, MD;  Location: [facility];  Service: Gynecology;  Laterality: N/A;   MYOMECTOMY  02/03/2011   Procedure: MYOMECTOMY;  Surgeon: Lazaro Arms, MD;  Location: [facility];  Service: Gynecology;  Laterality: N/A;   SVT ABLATION N/A 11/08/2018   Procedure: SVT ABLATION;  Surgeon: Marinus Maw, MD;  Location: [facility] INV[facility] CV LAB;  Service: Cardiovascular;  Laterality: N/A;   SVT ABLATION N/A 12/26/2020   Procedure: SVT ABLATION;  Surgeon: Marinus Maw, MD;  Location: [facility] INVASIVE CV LAB;  Service: Cardiovascular;  Laterality: N/A;   TONSILLECTOMY     TYMPANOSTOMY TUBE PLACEMENT      Family History  Problem Relation Age of Onset  Cirrhosis Mother    Diabetes Mother    Cancer Maternal Grandfather    Hypertension Sister    Diabetes Sister    Anesthesia problems Neg Hx    Hypotension Neg Hx    Malignant hyperthermia Neg Hx    Pseudochol deficiency Neg Hx     Social History   Socioeconomic History   Marital status: Single    Spouse name: Not on file   Number of children: Not on file   Years of education: Not on file   Highest education level: 12th grade   Occupational History   Not on file  Tobacco Use   Smoking status: Never   Smokeless tobacco: Never   Tobacco comments:    socially  Vaping Use   Vaping status: Never Used  Substance and Sexual Activity   Alcohol use: Not Currently    Comment: occasional   Drug use: Not Currently    Types: Marijuana    Comment: on occ   Sexual activity: Not Currently    Birth control/protection: Surgical    Comment: hyst  Other Topics Concern   Not on file  Social History Narrative   Not on file   Social Determinants of Health   Financial Resource Strain: Low Risk  (02/08/2023)   Overall Financial Resource Strain (CARDIA)    Difficulty of Paying Living Expenses: Not very hard  Food Insecurity: No Food Insecurity (02/08/2023)   Hunger Vital Sign    Worried About Running Out of Food in the Last Year: Never true    Ran Out of Food in the Last Year: Never true  Transportation Needs: No Transportation Needs (02/08/2023)   PRAPARE - Administrator, Civil Service (Medical): No    Lack of Transportation (Non-Medical): No  Physical Activity: Inactive (02/08/2023)   Exercise Vital Sign    Days of Exercise per Week: 1 day    Minutes of Exercise per Session: 0 min  Stress: No Stress Concern Present (02/08/2023)   [employer] of Occupational Health - Occupational Stress Questionnaire    Feeling of Stress : Only a little  Social Connections: Unknown (02/08/2023)   Social Connection and Isolation Panel [NHANES]    Frequency of Communication with Friends and Family: More than three times a week    Frequency of Social Gatherings with Friends and Family: Twice a week    Attends Religious Services: More than 4 times per year    Active Member of [employer] or Organizations: No    Attends Banker Meetings: Not on file    Marital Status: Patient declined  Intimate Partner Violence: Not At Risk (01/19/2023)   Humiliation, Afraid, Rape, and Kick questionnaire    Fear of Current or  Ex-Partner: No    Emotionally Abused: No    Physically Abused: No    Sexually Abused: No    Outpatient Medications Prior to Visit  Medication Sig Dispense Refill   albuterol (VENTOLIN HFA) 108 (90 Base) MCG/ACT inhaler Inhale 2 puffs into the lungs every 6 (six) hours as needed for wheezing or shortness of breath. 8 g 0   amlodipine-olmesartan (AZOR) 10-20 MG tablet Take 1 tablet by mouth daily. 30 tablet 1   Blood Glucose Monitoring Suppl DEVI 1 each by Does not apply route in the morning, at noon, and at bedtime. May substitute to any manufacturer covered by patient's insurance. 1 each 0   cyclobenzaprine (FLEXERIL) 10 MG tablet Take 1 tablet (10 mg total) by mouth every 8 (  eight) hours as needed for muscle spasms. 30 tablet 1   famotidine (PEPCID) 40 MG tablet Take 1 tablet (40 mg total) by mouth in the morning and at bedtime. 60 tablet 5   Fluticasone-Umeclidin-Vilant (TRELEGY ELLIPTA) 200-62.5-25 MCG/ACT AEPB Inhale 1 puff into the lungs daily. 28 each 5   gabapentin (NEURONTIN) 100 MG capsule Take 1 capsule (100 mg total) by mouth at bedtime. (Patient taking differently: Take 100 mg by mouth daily as needed (pain).) 90 capsule 1   Glucose Blood (BLOOD GLUCOSE TEST STRIPS) STRP 1 each by In Vitro route in the morning, at noon, and at bedtime. May substitute to any manufacturer covered by patient's insurance. 100 strip 0   levocetirizine (XYZAL) 5 MG tablet Take 1 tablet (5 mg total) by mouth in the morning and at bedtime. 60 tablet 5   metFORMIN (GLUCOPHAGE) 1000 MG tablet Take 0.5 tablets (500 mg total) by mouth 2 (two) times daily with a meal. 180 tablet 3   montelukast (SINGULAIR) 10 MG tablet Take 1 tablet (10 mg total) by mouth at bedtime. 30 tablet 2   Multiple Vitamins-Calcium (ONE-A-DAY WOMENS PO) Take 1 tablet by mouth daily.     NON FORMULARY Pt uses a cpap nightly     oxyCODONE (OXY IR/ROXICODONE) 5 MG immediate release tablet Take 1-2 tablets (5-10 mg total) by mouth every 4  (four) hours as needed for moderate pain. 30 tablet 0   SUMAtriptan (IMITREX) 100 MG tablet Take 1 tablet (100 mg total) by mouth every 2 (two) hours as needed for migraine. May repeat in 2 hours if headache persists or recurs. 10 tablet 2   Vitamin D, Ergocalciferol, (DRISDOL) 1.25 MG (50000 UNIT) CAPS capsule Take 1 capsule (50,000 Units total) by mouth every 7 (seven) days. 20 capsule 1   ibuprofen (ADVIL) 600 MG tablet Take 1 tablet (600 mg total) by mouth every 6 (six) hours. (Patient not taking: Reported on 02/11/2023) 30 tablet 0   Semaglutide,0.25 or 0.5MG /DOS, (OZEMPIC, 0.25 OR 0.5 MG/DOSE,) 2 MG/3ML SOPN Inject 0.25 mg into the skin once a week. (Patient not taking: Reported on 01/27/2023) 3 mL 0   No facility-administered medications prior to visit.    Allergies  Allergen Reactions   Shellfish Allergy Shortness Of Breath, Itching and Swelling    Mouth became swollen, throat started itching, and developed shortness of breath   Sulfa Antibiotics Itching, Shortness Of Breath and Swelling    Mouth became swollen, throat started itching, and developed shortness of breath   Adhesive [Tape] Itching    Depends on the type of medical tape    ROS Review of Systems  Constitutional:  Negative for chills and fever.  Eyes:  Negative for visual disturbance.  Respiratory:  Negative for chest tightness and shortness of breath.   Neurological:  Negative for dizziness and headaches.      Objective:    Physical Exam Constitutional:      Appearance: She is obese.  HENT:     Head: Normocephalic.     Mouth/Throat:     Mouth: Mucous membranes are moist.  Cardiovascular:     Rate and Rhythm: Normal rate.     Heart sounds: Normal heart sounds.  Pulmonary:     Effort: Pulmonary effort is normal.     Breath sounds: Normal breath sounds.  Neurological:     Mental Status: She is alert.     BP 126/84 (BP Location: Left Arm, Patient Position: Sitting, Cuff Size: Large)   Pulse  70   Ht 5'  2" (1.575 m)   Wt (!) 349 lb 1.3 oz (158.3 kg)   LMP 12/26/2022 (Approximate)   SpO2 97%   BMI 63.85 kg/m  Wt Readings from Last 3 Encounters:  02/11/23 (!) 349 lb 1.3 oz (158.3 kg)  02/03/23 (!) 349 lb (158.3 kg)  02/02/23 (!) 351 lb (159.2 kg)    Lab Results  Component Value Date   TSH 2.680 01/17/2023   Lab Results  Component Value Date   WBC 8.0 01/31/2023   HGB 10.5 (L) 01/31/2023   HCT 33.9 (L) 01/31/2023   MCV 85.4 01/31/2023   PLT 345 01/31/2023   Lab Results  Component Value Date   NA 135 01/31/2023   K 4.2 01/31/2023   CO2 26 01/31/2023   GLUCOSE 92 01/31/2023   BUN 11 01/31/2023   CREATININE 0.78 01/31/2023   BILITOT 0.6 01/22/2023   ALKPHOS 49 01/22/2023   AST 10 (L) 01/22/2023   ALT 12 01/22/2023   PROT 6.9 01/22/2023   ALBUMIN 3.2 (L) 01/22/2023   CALCIUM 9.0 01/31/2023   ANIONGAP 8 01/31/2023   EGFR 114 01/17/2023   Lab Results  Component Value Date   CHOL 151 01/17/2023   Lab Results  Component Value Date   HDL 44 01/17/2023   Lab Results  Component Value Date   LDLCALC 98 01/17/2023   Lab Results  Component Value Date   TRIG 42 01/17/2023   Lab Results  Component Value Date   CHOLHDL 3.4 01/17/2023   Lab Results  Component Value Date   HGBA1C 6.2 (H) 01/17/2023      Assessment & Plan:  Morbid obesity (HCC) Assessment & Plan: The patient has a BMI of 63.85 and has been implementing lifestyle changes for at least three months but reports minimal weight loss. She expresses a desire to start weight loss therapy today. We will initiate treatment with Wegovy 0.25 mg, with plans for monthly dose adjustments. I emphasized the importance of lifestyle modifications, including following a heart-healthy diet and increasing physical activity, to support her weight loss efforts.  Wt Readings from Last 3 Encounters:  02/11/23 (!) 349 lb 1.3 oz (158.3 kg)  02/03/23 (!) 349 lb (158.3 kg)  02/02/23 (!) 351 lb (159.2 kg)      Orders: -      LOVFIE; Inject 0.25 mg into the skin once a week.  Dispense: 2 mL; Refill: 0  Essential hypertension Assessment & Plan: The patient's condition is currently controlled. I encouraged her to continue the current treatment regimen of amlodipine-olmesartan 10-20 mg daily. Additionally, I recommended supportive care for the swelling in her right leg, which includes elevating the leg, using compression stockings, and avoiding prolonged periods of standing or sitting. I also advised her to be mindful of her sodium intake to help manage the swelling.     Note: This chart has been completed using Engineer, civil (consulting) software, and while attempts have been made to ensure accuracy, certain words and phrases may not be transcribed as intended.   Follow-up: Return in about 4 months (around 06/14/2023).   Gilmore Laroche, FNP

## 2023-02-11 NOTE — Assessment & Plan Note (Signed)
The patient's condition is currently controlled. I encouraged her to continue the current treatment regimen of amlodipine-olmesartan 10-20 mg daily. Additionally, I recommended supportive care for the swelling in her right leg, which includes elevating the leg, using compression stockings, and avoiding prolonged periods of standing or sitting. I also advised her to be mindful of her sodium intake to help manage the swelling.

## 2023-02-11 NOTE — Assessment & Plan Note (Addendum)
The patient has a BMI of 63.85 and has been implementing lifestyle changes for at least three months but reports minimal weight loss. She expresses a desire to start weight loss therapy today. We will initiate treatment with Wegovy 0.25 mg, with plans for monthly dose adjustments. I emphasized the importance of lifestyle modifications, including following a heart-healthy diet and increasing physical activity, to support her weight loss efforts.  Wt Readings from Last 3 Encounters:  02/11/23 (!) 349 lb 1.3 oz (158.3 kg)  02/03/23 (!) 349 lb (158.3 kg)  02/02/23 (!) 351 lb (159.2 kg)

## 2023-02-11 NOTE — Patient Instructions (Addendum)
I appreciate the opportunity to provide care to you today!   Follow up:  4 months  Weight loss Please start taking Wegovy 0.25 mg weekly, and remember to call monthly for dose adjustments as needed.  Recommendations for Weight Loss Management:  Emphasize Lifestyle Changes: A heart-healthy diet and increased physical activity are crucial. Healthy Tips for Weight Loss: Increase Intake of Nutrient-Rich Foods: Prioritize fruits, vegetables, and whole grains. Incorporate Lean Proteins: Include chicken, fish, beans, and legumes in your diet. Choose Low-Fat Dairy Products: Opt for dairy products that are low in fat. Reduce Unhealthy Fats: Limit saturated fats, trans fatty acids, and cholesterol. Aim for Regular Physical Activity: Engage in at least 30 minutes of brisk walking or other physical activities on at least 5 days a week.    Please continue to a heart-healthy diet and increase your physical activities. Try to exercise for at least five days a week.    It was a pleasure to see you and I look forward to continuing to work together on your health and well-being. Please do not hesitate to call the office if you need care or have questions about your care.  In case of emergency, please visit the Emergency Department for urgent care, or contact our clinic at [phone] to schedule an appointment. We're here to help you!   Have a wonderful day and week. With Gratitude, Gilmore Laroche MSN, FNP-BC

## 2023-02-16 ENCOUNTER — Ambulatory Visit: Payer: No Typology Code available for payment source

## 2023-02-16 DIAGNOSIS — L501 Idiopathic urticaria: Secondary | ICD-10-CM | POA: Diagnosis not present

## 2023-02-16 MED ORDER — OMALIZUMAB 150 MG/ML ~~LOC~~ SOSY
300.0000 mg | PREFILLED_SYRINGE | SUBCUTANEOUS | Status: DC
Start: 2023-02-16 — End: 2024-02-27
  Administered 2023-02-16 – 2024-02-20 (×7): 300 mg via SUBCUTANEOUS

## 2023-02-16 MED ORDER — EPINEPHRINE 0.3 MG/0.3ML IJ SOAJ: 0.3000 mg | INTRAMUSCULAR | 1 refills | Status: DC | PRN

## 2023-02-16 NOTE — Progress Notes (Signed)
Immunotherapy   Patient Details  Name: Jessica Sanchez MRN: [id number] Date of Birth: 11/19/1985  02/16/2023  Jessica Sanchez started injections for  Xolair Following schedule: Every twenty eight days.  Frequency: Epi-Pen:Prescription for Epi-Pen given Consent signed previously and patient instructions given. Patient sat in the lobby for thirty minutes without an issue.    Ralene Muskrat 02/16/2023, 1:50 PM

## 2023-02-16 NOTE — Telephone Encounter (Signed)
Patient advised approval, copay card and submit to [facility]. Will reach out once delivery set to make appt to start

## 2023-02-17 ENCOUNTER — Telehealth: Payer: Self-pay | Admitting: Family Medicine

## 2023-02-17 NOTE — Telephone Encounter (Signed)
Patty called from [facility] health describe wegovy still pending prior authorization. Check status of the prior authorization call back # [phone]

## 2023-02-17 NOTE — Telephone Encounter (Signed)
See previous tele message

## 2023-02-17 NOTE — Telephone Encounter (Signed)
lmtrc

## 2023-02-21 NOTE — Telephone Encounter (Signed)
Thanks, Tam Tam!

## 2023-02-22 ENCOUNTER — Encounter: Payer: Self-pay | Admitting: Obstetrics & Gynecology

## 2023-02-22 ENCOUNTER — Ambulatory Visit (INDEPENDENT_AMBULATORY_CARE_PROVIDER_SITE_OTHER): Payer: 59 | Admitting: Obstetrics & Gynecology

## 2023-02-22 VITALS — BP 120/69 | HR 90 | Ht 62.0 in | Wt 355.0 lb

## 2023-02-22 DIAGNOSIS — Z9889 Other specified postprocedural states: Secondary | ICD-10-CM

## 2023-02-22 NOTE — Progress Notes (Signed)
HPI: Patient returns for routine postoperative follow-up having undergone TAH BS on 01/19/23.  The patient's immediate postoperative recovery has been unremarkable. Since hospital discharge the patient reports no problems some spotting with BM.   Current Outpatient Medications: albuterol (VENTOLIN HFA) 108 (90 Base) MCG/ACT inhaler, Inhale 2 puffs into the lungs every 6 (six) hours as needed for wheezing or shortness of breath., Disp: 8 g, Rfl: 0 amlodipine-olmesartan (AZOR) 10-20 MG tablet, Take 1 tablet by mouth daily., Disp: 30 tablet, Rfl: 1 Blood Glucose Monitoring Suppl DEVI, 1 each by Does not apply route in the morning, at noon, and at bedtime. May substitute to any manufacturer covered by patient's insurance., Disp: 1 each, Rfl: 0 cyclobenzaprine (FLEXERIL) 10 MG tablet, Take 1 tablet (10 mg total) by mouth every 8 (eight) hours as needed for muscle spasms., Disp: 30 tablet, Rfl: 1 famotidine (PEPCID) 40 MG tablet, Take 1 tablet (40 mg total) by mouth in the morning and at bedtime., Disp: 60 tablet, Rfl: 5 Fluticasone-Umeclidin-Vilant (TRELEGY ELLIPTA) 200-62.5-25 MCG/ACT AEPB, Inhale 1 puff into the lungs daily., Disp: 28 each, Rfl: 5 gabapentin (NEURONTIN) 100 MG capsule, Take 1 capsule (100 mg total) by mouth at bedtime. (Patient taking differently: Take 100 mg by mouth daily as needed (pain).), Disp: 90 capsule, Rfl: 1 Glucose Blood (BLOOD GLUCOSE TEST STRIPS) STRP, 1 each by In Vitro route in the morning, at noon, and at bedtime. May substitute to any manufacturer covered by patient's insurance., Disp: 100 strip, Rfl: 0 ibuprofen (ADVIL) 600 MG tablet, Take 1 tablet (600 mg total) by mouth every 6 (six) hours., Disp: 30 tablet, Rfl: 0 Lancet Device MISC, 1 each by Does not apply route in the morning, at noon, and at bedtime. May substitute to any manufacturer covered by patient's insurance., Disp: 1 each, Rfl: 0 Lancets Misc. MISC, 1 each by Does not apply route in the morning, at  noon, and at bedtime. May substitute to any manufacturer covered by patient's insurance., Disp: 100 each, Rfl: 0 levocetirizine (XYZAL) 5 MG tablet, Take 1 tablet (5 mg total) by mouth in the morning and at bedtime., Disp: 60 tablet, Rfl: 5 metFORMIN (GLUCOPHAGE) 1000 MG tablet, Take 0.5 tablets (500 mg total) by mouth 2 (two) times daily with a meal., Disp: 180 tablet, Rfl: 3 montelukast (SINGULAIR) 10 MG tablet, Take 1 tablet (10 mg total) by mouth at bedtime., Disp: 30 tablet, Rfl: 2 Multiple Vitamins-Calcium (ONE-A-DAY WOMENS PO), Take 1 tablet by mouth daily., Disp: , Rfl:  NON FORMULARY, Pt uses a cpap nightly, Disp: , Rfl:  oxyCODONE (OXY IR/ROXICODONE) 5 MG immediate release tablet, Take 1-2 tablets (5-10 mg total) by mouth every 4 (four) hours as needed for moderate pain., Disp: 30 tablet, Rfl: 0 SUMAtriptan (IMITREX) 100 MG tablet, Take 1 tablet (100 mg total) by mouth every 2 (two) hours as needed for migraine. May repeat in 2 hours if headache persists or recurs., Disp: 10 tablet, Rfl: 2 Vitamin D, Ergocalciferol, (DRISDOL) 1.25 MG (50000 UNIT) CAPS capsule, Take 1 capsule (50,000 Units total) by mouth every 7 (seven) days., Disp: 20 capsule, Rfl: 1 Semaglutide,0.25 or 0.5MG /DOS, (OZEMPIC, 0.25 OR 0.5 MG/DOSE,) 2 MG/3ML SOPN, Inject 0.25 mg into the skin once a week. (Patient not taking: Reported on 01/27/2023), Disp: 3 mL, Rfl: 0  No current facility-administered medications for this visit.    Blood pressure 128/76, pulse 75, last menstrual period 12/26/2022.  Physical Exam: Incision clean dry intact Cervix intact, will need Paps in future routine q3 years  Diagnostic Tests:   Pathology: benign  Impression + Management plan:   ICD-10-CM   1. Post-operative state: TAH + BS 01/19/23  Z98.890          Medications Prescribed this encounter: No orders of the defined types were placed in this encounter.     Follow up: Return in about 2 years (around 02/21/2025), or if  symptoms worsen or fail to improve, for HPV cytology.    Lazaro Arms, MD Attending Physician for the Center for [facility] 02/08/2023 11:07 AM

## 2023-02-23 NOTE — Telephone Encounter (Signed)
Patient calling to check on PA status

## 2023-02-24 NOTE — Telephone Encounter (Signed)
The insurance we have on file is not her primary insurance. Before we can PA the wegove we need a copy of her primary insurance card.

## 2023-02-24 NOTE — Telephone Encounter (Signed)
Called patient she will bring her insurance cards by the office today

## 2023-02-24 NOTE — Telephone Encounter (Signed)
PA submitted 02/24/23

## 2023-03-01 ENCOUNTER — Other Ambulatory Visit (HOSPITAL_COMMUNITY): Payer: Self-pay

## 2023-03-01 NOTE — Telephone Encounter (Signed)
PA submitted 10/29 to [facility]

## 2023-03-01 NOTE — Telephone Encounter (Signed)
Patient came by office insurance cards are uploaded. [employer] is primary, [employer] secondary.

## 2023-03-07 ENCOUNTER — Telehealth: Payer: Self-pay | Admitting: Family Medicine

## 2023-03-07 NOTE — Telephone Encounter (Signed)
Left detailed vm for a return call.

## 2023-03-07 NOTE — Telephone Encounter (Signed)
Spoke with Pam states other insurance was showing up on their end they are working on having it removed.

## 2023-03-07 NOTE — Telephone Encounter (Signed)
Patti with [employer] called in on patient behalf   Checking status on PA for patient.  Wants a call back   [phone]

## 2023-03-14 ENCOUNTER — Ambulatory Visit (INDEPENDENT_AMBULATORY_CARE_PROVIDER_SITE_OTHER): Payer: 59

## 2023-03-14 DIAGNOSIS — L501 Idiopathic urticaria: Secondary | ICD-10-CM

## 2023-03-20 ENCOUNTER — Other Ambulatory Visit: Payer: Self-pay | Admitting: Family Medicine

## 2023-03-20 DIAGNOSIS — I10 Essential (primary) hypertension: Secondary | ICD-10-CM

## 2023-03-23 ENCOUNTER — Encounter (HOSPITAL_COMMUNITY): Payer: Self-pay

## 2023-03-23 ENCOUNTER — Other Ambulatory Visit: Payer: Self-pay

## 2023-03-23 ENCOUNTER — Emergency Department (HOSPITAL_COMMUNITY)
Admission: EM | Admit: 2023-03-23 | Discharge: 2023-03-23 | Disposition: A | Discharge status: Home / Self Care | Payer: 59 | Attending: Emergency Medicine | Admitting: Emergency Medicine

## 2023-03-23 DIAGNOSIS — U071 COVID-19: Secondary | ICD-10-CM | POA: Insufficient documentation

## 2023-03-23 DIAGNOSIS — Z7984 Long term (current) use of oral hypoglycemic drugs: Secondary | ICD-10-CM | POA: Diagnosis not present

## 2023-03-23 DIAGNOSIS — R0602 Shortness of breath: Secondary | ICD-10-CM | POA: Diagnosis not present

## 2023-03-23 DIAGNOSIS — J45909 Unspecified asthma, uncomplicated: Secondary | ICD-10-CM | POA: Insufficient documentation

## 2023-03-23 DIAGNOSIS — E119 Type 2 diabetes mellitus without complications: Secondary | ICD-10-CM | POA: Insufficient documentation

## 2023-03-23 LAB — RESP PANEL BY RT-PCR (RSV, FLU A&B, COVID)  RVPGX2
Influenza A by PCR: NEGATIVE
Influenza B by PCR: NEGATIVE
Resp Syncytial Virus by PCR: NEGATIVE
SARS Coronavirus 2 by RT PCR: POSITIVE — AB

## 2023-03-23 LAB — GROUP A STREP BY PCR: Group A Strep by PCR: NOT DETECTED

## 2023-03-23 MED ORDER — ACETAMINOPHEN 500 MG PO TABS
1000.0000 mg | ORAL_TABLET | Freq: Once | ORAL | Status: AC
  Administered 2023-03-23: 1000 mg via ORAL
  Filled 2023-03-23: qty 2

## 2023-03-23 NOTE — ED Provider Notes (Signed)
[place] [facility] DEPARTMENT AT [facility] Provider Note   CSN: [id number] Arrival date & time: 03/23/23  1139     History  Chief Complaint  Patient presents with   Shortness of Breath    Jessica Sanchez is a 37 y.o. female presents with 3-day onset of sinus congestion, rhinorrhea, nonproductive cough, subjective fever, myalgias,  chest pain and shortness of breath.  She has been using Mucinex, Robitussin without significant relief.  Chest pain is reproducible, nonexertional, only occurs with cough.  She states she uses an albuterol inhaler because she is " allergic to everything".  Denies having asthma or any other respiratory issues.  Denies any recent sick contacts.  Nominal pain, nausea, vomiting or diarrhea.  Shortness of Breath  Past Medical History:  Diagnosis Date   Angio-edema    Anxiety    Arthritis    right shoulder   Asthma    Diabetes mellitus without complication (HCC)    Dyspnea    Dysrhythmia    hx of SVT   Fibroid (bleeding) (uterine)    Fibroid, uterine    H/O metrorrhagia    Hypertension    IUD (intrauterine device) in place 07/11/2015   Menorrhagia    Obesity, Class III, BMI 40-49.9 (morbid obesity) (HCC) 11/11/2018   Sleep apnea    uses CPAP - setting 11   SVT (supraventricular tachycardia) (HCC)        Home Medications Prior to Admission medications   Medication Sig Start Date End Date Taking? Authorizing Provider  albuterol (VENTOLIN HFA) 108 (90 Base) MCG/ACT inhaler Inhale 2 puffs into the lungs every 6 (six) hours as needed for wheezing or shortness of breath. 10/26/21   Gilmore Laroche, FNP  amlodipine-olmesartan (AZOR) 10-20 MG tablet TAKE ONE TABLET BY MOUTH EVERY DAY 03/21/23   Gilmore Laroche, FNP  Blood Glucose Monitoring Suppl DEVI 1 each by Does not apply route in the morning, at noon, and at bedtime. May substitute to any manufacturer covered by patient's insurance. 01/11/23   Gilmore Laroche, FNP  cyclobenzaprine  (FLEXERIL) 10 MG tablet Take 1 tablet (10 mg total) by mouth every 8 (eight) hours as needed for muscle spasms. 02/03/23   Lazaro Arms, MD  EPINEPHrine (EPIPEN 2-PAK) 0.3 mg/0.3 mL IJ SOAJ injection Inject 0.3 mg into the muscle as needed for anaphylaxis. 02/16/23   Alfonse Spruce, MD  famotidine (PEPCID) 40 MG tablet Take 1 tablet (40 mg total) by mouth in the morning and at bedtime. 02/02/23   Alfonse Spruce, MD  Fluticasone-Umeclidin-Vilant (TRELEGY ELLIPTA) 200-62.5-25 MCG/ACT AEPB Inhale 1 puff into the lungs daily. 02/02/23   Alfonse Spruce, MD  gabapentin (NEURONTIN) 100 MG capsule Take 1 capsule (100 mg total) by mouth at bedtime. Patient taking differently: Take 100 mg by mouth daily as needed (pain). 09/28/22   Gilmore Laroche, FNP  ibuprofen (ADVIL) 600 MG tablet Take 1 tablet (600 mg total) by mouth every 6 (six) hours. 01/22/23   Lazaro Arms, MD  levocetirizine (XYZAL) 5 MG tablet Take 1 tablet (5 mg total) by mouth in the morning and at bedtime. 02/02/23   Alfonse Spruce, MD  metFORMIN (GLUCOPHAGE) 1000 MG tablet Take 0.5 tablets (500 mg total) by mouth 2 (two) times daily with a meal. 01/11/23   Gilmore Laroche, FNP  montelukast (SINGULAIR) 10 MG tablet Take 1 tablet (10 mg total) by mouth at bedtime. 10/27/22   Hetty Blend, FNP  Multiple Vitamins-Calcium (ONE-A-DAY WOMENS PO) Take  1 tablet by mouth daily.    [provider]  NON FORMULARY Pt uses a cpap nightly    [provider]  oxyCODONE (OXY IR/ROXICODONE) 5 MG immediate release tablet Take 1-2 tablets (5-10 mg total) by mouth every 4 (four) hours as needed for moderate pain. 01/22/23   Lazaro Arms, MD  Semaglutide-Weight Management (WEGOVY) 0.25 MG/0.5ML SOAJ Inject 0.25 mg into the skin once a week. Patient not taking: Reported on 02/22/2023 02/11/23   Gilmore Laroche, FNP  SUMAtriptan (IMITREX) 100 MG tablet Take 1 tablet (100 mg total) by mouth every 2 (two) hours as needed for  migraine. May repeat in 2 hours if headache persists or recurs. 12/20/22   Anabel Halon, MD  Vitamin D, Ergocalciferol, (DRISDOL) 1.25 MG (50000 UNIT) CAPS capsule Take 1 capsule (50,000 Units total) by mouth every 7 (seven) days. 01/19/23   Gilmore Laroche, FNP  apixaban (ELIQUIS) 5 MG TABS tablet Take 2 tablets (10mg ) twice daily for 7 days, then 1 tablet (5mg ) twice daily 04/20/19 04/20/19  Michela Pitcher A, PA-C  levonorgestrel (MIRENA) 20 MCG/24HR IUD 1 each by Intrauterine route once.   06/25/20  [provider]      Allergies    Shellfish allergy, Sulfa antibiotics, and Adhesive [tape]    Review of Systems   Review of Systems  Respiratory:  Positive for shortness of breath.     Physical Exam Updated Vital Signs BP 138/80 (BP Location: Left Arm)   Pulse 72   Temp 98 F (36.7 C) (Oral)   Resp 15   Ht 5\' 2"  (1.575 m)   Wt (!) 153.8 kg   LMP 12/26/2022 (Approximate)   SpO2 98%   BMI 62.00 kg/m  Physical Exam Vitals and nursing note reviewed.  Constitutional:      General: She is not in acute distress.    Appearance: She is well-developed.  HENT:     Head: Normocephalic and atraumatic.     Nose: Congestion present.     Right Sinus: Frontal sinus tenderness present.     Left Sinus: Frontal sinus tenderness present.  Eyes:     Conjunctiva/sclera: Conjunctivae normal.  Cardiovascular:     Rate and Rhythm: Normal rate and regular rhythm.     Heart sounds: No murmur heard. Pulmonary:     Effort: Pulmonary effort is normal. No respiratory distress.     Breath sounds: Normal breath sounds.  Chest:     Chest wall: Tenderness present.  Abdominal:     Palpations: Abdomen is soft.     Tenderness: There is no abdominal tenderness.  Musculoskeletal:        General: No swelling.     Cervical back: Neck supple.  Skin:    General: Skin is warm and dry.     Capillary Refill: Capillary refill takes less than 2 seconds.  Neurological:     General: No focal deficit  present.     Mental Status: She is alert.  Psychiatric:        Mood and Affect: Mood normal.     ED Results / Procedures / Treatments   Labs (all labs ordered are listed, but only abnormal results are displayed) Labs Reviewed  RESP PANEL BY RT-PCR (RSV, FLU A&B, COVID)  [id number] - Abnormal; Notable for the following components:      Result Value   SARS Coronavirus 2 by RT PCR POSITIVE (*)    All other components within normal limits  GROUP A STREP BY  PCR    EKG None  Radiology No results found.  Procedures Procedures    Medications Ordered in ED Medications  acetaminophen (TYLENOL) tablet 1,000 mg (1,000 mg Oral Given 03/23/23 1338)    ED Course/ Medical Decision Making/ A&P                                 Medical Decision Making  This patient presents to the ED with chief complaint(s) of flulike symptoms x 3 days with pertinent past medical history of status post hysterectomy 9/24.  The complaint involves an extensive differential diagnosis and also carries with it a high risk of complications and morbidity.    The differential diagnosis includes  Viral URI, pneumonia, sinus infection, strep, AOM, PE The initial plan is to  Will start with respiratory panel and strep test Additional history obtained: Additional history obtained from family Records reviewed  EMR  Initial Assessment:   37 year old with flulike symptoms, concerning for viral etiology.  Lung sounds are clear, nonproductive cough, low sufficient for pneumonia.  She is 2 months status post hysterectomy, however she has no increased leg swelling or pain.  She is not hypoxic,  No hematemesis.  Chest pain is substernal, reproducible with palpation, no exertional component.  Low suspicion for PE  Independent ECG/labs interpretation:  The following labs were independently interpreted:  Negative strep test.  Positive viral panel for COVID-19  Independent visualization and interpretation of imaging: No  imaging indicated  Treatment and Reassessment: Patient given Tylenol for low-grade fever.  Upon reassessment patient's temperature is down from 100.3 to 98  Consultations obtained:   None  Disposition:   Patient discharged home, educated on quarantine process.  Work note provided. The patient has been appropriately medically screened and/or stabilized in the ED. I have low suspicion for any other emergent medical condition which would require further screening, evaluation or treatment in the ED or require inpatient management. At time of discharge the patient is hemodynamically stable and in no acute distress. I have discussed work-up results and diagnosis with patient and answered all questions. Patient is agreeable with discharge plan. We discussed strict return precautions for returning to the emergency department and they verbalized understanding.     Social Determinants of Health:   None  This note was dictated with voice recognition software.  Despite best efforts at proofreading, errors may have occurred which can change the documentation meaning.           Final Clinical Impression(s) / ED Diagnoses Final diagnoses:  COVID-19    Rx / DC Orders ED Discharge Orders     None         Fabienne Bruns 03/23/23 1528    Rondel Baton, MD 03/26/23 [phone]

## 2023-03-23 NOTE — ED Triage Notes (Signed)
Pt to er, pt states that she is here because she has a sore throat, cough and generalized body aches. Pt talking in full sentences, pt states that she has been feeling poorly for the past couple of days.

## 2023-03-23 NOTE — Discharge Instructions (Addendum)
It was a pleasure taking care of you this afternoon.  You were evaluated in the emergency department for flulike symptoms.  Respiratory panel and strep test were performed.  You tested positive for COVID-19.  As discussed please quarantine while you are symptomatic.  You may return to work when your symptoms have improved and you are without a fever.  Please take Tylenol or Motrin as needed for your symptoms or fever.  Please return to the emergency department if you experience any new or worsening symptoms including worsening shortness of breath or productive cough.

## 2023-03-24 ENCOUNTER — Ambulatory Visit: Payer: Self-pay | Admitting: Family Medicine

## 2023-03-24 NOTE — Telephone Encounter (Signed)
Requesting antiviral for Covid that started 2 days ago.  Chief Complaint: Covid dx Frequency: 2 days Disposition: [] ED /[] Urgent Care (no appt availability in office) / [] Appointment(In office/virtual)/ []  [facility]/ [] Home Care/ [] Refused Recommended Disposition /[] [place]/ [x]  Follow-up with PCP Additional Notes: Pt requesting antiviral for the Covid. S/s started 2 days ago. Copied from CRM [id number]. Topic: Clinical - Medication Question >> Mar 24, 2023 10:38 AM Maxwell Marion wrote: Reason for CRM: Pt tested positive for COVID in the ED yesterday and wants something prescribed for the symptoms Reason for Disposition . [1] Follow-up call to recent contact AND [2] information only call, no triage required  Answer Assessment - Initial Assessment Questions 1. REASON FOR CALL or QUESTION: "What is your reason for calling today?" or "How can I best help you?" or "What question do you have that I can help answer?"      Pt would like antiviral for Covid that started 2 days ago.  Protocols used: Information Only Call - No Triage-A-AH

## 2023-03-24 NOTE — Telephone Encounter (Signed)
Copied from CRM [id number]. Topic: Clinical - Medication Question >> Mar 24, 2023 10:38 AM Maxwell Marion wrote: Reason for CRM: Pt tested positive for COVID in the ED yesterday and wants something prescribed for the symptoms

## 2023-03-28 ENCOUNTER — Ambulatory Visit
Admission: EM | Admit: 2023-03-28 | Discharge: 2023-03-28 | Disposition: A | Discharge status: Home / Self Care | Payer: 59

## 2023-03-28 DIAGNOSIS — U071 COVID-19: Secondary | ICD-10-CM | POA: Diagnosis not present

## 2023-03-28 NOTE — ED Triage Notes (Signed)
Cough, itchy throat, ear pain x 1 week. Pt states she was positive for Covid last week and wants to be re-tested.

## 2023-03-29 NOTE — ED Provider Notes (Signed)
[facility]    CSN: [id number] Arrival date & time: 03/28/23  1537      History   Chief Complaint Chief Complaint  Patient presents with   Cough    HPI Jessica Sanchez is a 37 y.o. female.   Presenting today following up on recent COVID diagnosis last week.  States she is overall feeling better but still having a slightly itchy throat, ear pressure, mild cough.  Denies fever, chills, chest pain, shortness of breath, abdominal pain, nausea vomiting or diarrhea.  Taking over-the-counter remedies and her inhaler for asthma with good relief.    Past Medical History:  Diagnosis Date   Angio-edema    Anxiety    Arthritis    right shoulder   Asthma    Diabetes mellitus without complication (HCC)    Dyspnea    Dysrhythmia    hx of SVT   Fibroid (bleeding) (uterine)    Fibroid, uterine    H/O metrorrhagia    Hypertension    IUD (intrauterine device) in place 07/11/2015   Menorrhagia    Obesity, Class III, BMI 40-49.9 (morbid obesity) (HCC) 11/11/2018   Sleep apnea    uses CPAP - setting 11   SVT (supraventricular tachycardia) (HCC)     Patient Active Problem List   Diagnosis Date Noted   Fibroids 01/26/2023   S/P hysterectomy 01/19/2023   Prediabetes 01/12/2023   Intractable chronic migraine with aura and without status migrainosus 12/20/2022   Iron deficiency anemia due to chronic blood loss 12/20/2022   Essential hypertension 11/01/2022   DUB (dysfunctional uterine bleeding) 11/01/2022   Not well controlled moderate persistent asthma 10/27/2022   Peripheral neuropathy 09/28/2022   Headache 07/01/2022   Screening for STD (sexually transmitted disease) 03/30/2022   Abdominal pain 03/02/2022   RUQ pain 02/23/2022   Abnormal abdominal CT scan 02/23/2022   Constipation 02/11/2022   Mild persistent asthma without complication 02/03/2022   Chronic urticaria 02/03/2022   Seasonal and perennial allergic rhinitis 02/03/2022   Anaphylactic shock due to  adverse food reaction 02/03/2022   Need for immunization against influenza 01/27/2022   OSA (obstructive sleep apnea) 11/23/2021   Otitis media of left ear 10/26/2021   Chronic venous insufficiency of lower extremity 10/26/2021   Allergy 10/26/2021   Encounter for IUD removal    Menorrhagia 09/04/2020   Elevated troponin    Attempted IUD removal, unsuccessful 07/05/2019   Encounter for IUD insertion 07/05/2019   Gastroesophageal reflux disease    Nonspecific chest pain 11/11/2018   Morbid obesity (HCC) 11/11/2018   Elevated d-dimer 11/11/2018   SVT (supraventricular tachycardia) (HCC) 10/04/2018   IUD (intrauterine device) in place 07/11/2015   Other disorder of menstruation and other abnormal bleeding from female genital tract 10/30/2012    Past Surgical History:  Procedure Laterality Date   DILATATION AND CURETTAGE/HYSTEROSCOPY WITH MINERVA N/A 12/01/2021   Procedure: DILATATION AND CURETTAGE/HYSTEROSCOPY WITH MINERVA;  Surgeon: Lazaro Arms, MD;  Location: [facility];  Service: Gynecology;  Laterality: N/A;   HYSTERECTOMY ABDOMINAL WITH SALPINGECTOMY Bilateral 01/19/2023   Procedure: HYSTERECTOMY ABDOMINAL WITH SALPINGECTOMY;  Surgeon: Lazaro Arms, MD;  Location: [facility];  Service: Gynecology;  Laterality: Bilateral;   HYSTEROSCOPY N/A 09/17/2020   Procedure: HYSTEROSCOPY;  Surgeon: Lazaro Arms, MD;  Location: [facility];  Service: Gynecology;  Laterality: N/A;   HYSTEROSCOPY WITH D & C N/A 09/27/2013   Procedure: DILATATION AND CURETTAGE /HYSTEROSCOPY and insertion of Mirena IUD ;  Surgeon: Freddrick March. Tenny Craw,  MD;  Location: [facility];  Service: Gynecology;  Laterality: N/A;   IUD REMOVAL N/A 09/17/2020   Procedure: INTRAUTERINE DEVICE (IUD) REMOVAL (2);  Surgeon: Lazaro Arms, MD;  Location: [facility];  Service: Gynecology;  Laterality: N/A;   LAPAROSCOPY N/A 01/19/2023   Procedure: LAPAROSCOPY DIAGNOSTIC;  Surgeon: Lazaro Arms, MD;  Location: [facility];  Service: Gynecology;  Laterality:  N/A;   MYOMECTOMY  02/03/2011   Procedure: MYOMECTOMY;  Surgeon: Lazaro Arms, MD;  Location: [facility];  Service: Gynecology;  Laterality: N/A;   SVT ABLATION N/A 11/08/2018   Procedure: SVT ABLATION;  Surgeon: Marinus Maw, MD;  Location: [facility];  Service: Cardiovascular;  Laterality: N/A;   SVT ABLATION N/A 12/26/2020   Procedure: SVT ABLATION;  Surgeon: Marinus Maw, MD;  Location: [facility];  Service: Cardiovascular;  Laterality: N/A;   TONSILLECTOMY     TYMPANOSTOMY TUBE PLACEMENT      OB History     Gravida  1   Para      Term      Preterm      AB  1   Living         SAB  1   IAB      Ectopic      Multiple      Live Births               Home Medications    Prior to Admission medications   Medication Sig Start Date End Date Taking? Authorizing Provider  albuterol (VENTOLIN HFA) 108 (90 Base) MCG/ACT inhaler Inhale 2 puffs into the lungs every 6 (six) hours as needed for wheezing or shortness of breath. 10/26/21  Yes Gilmore Laroche, FNP  amlodipine-olmesartan (AZOR) 10-20 MG tablet TAKE ONE TABLET BY MOUTH EVERY DAY 03/21/23  Yes Gilmore Laroche, FNP  Blood Glucose Monitoring Suppl DEVI 1 each by Does not apply route in the morning, at noon, and at bedtime. May substitute to any manufacturer covered by patient's insurance. 01/11/23  Yes Gilmore Laroche, FNP  Fluticasone-Umeclidin-Vilant (TRELEGY ELLIPTA) 200-62.5-25 MCG/ACT AEPB Inhale 1 puff into the lungs daily. 02/02/23  Yes Alfonse Spruce, MD  gabapentin (NEURONTIN) 100 MG capsule Take 1 capsule (100 mg total) by mouth at bedtime. Patient taking differently: Take 100 mg by mouth daily as needed (pain). 09/28/22  Yes Gilmore Laroche, FNP  levocetirizine (XYZAL) 5 MG tablet Take 1 tablet (5 mg total) by mouth in the morning and at bedtime. 02/02/23  Yes Alfonse Spruce, MD  metFORMIN (GLUCOPHAGE) 1000 MG tablet Take 0.5 tablets (500 mg total) by mouth 2 (two) times daily  with a meal. 01/11/23  Yes Gilmore Laroche, FNP  Multiple Vitamins-Calcium (ONE-A-DAY WOMENS PO) Take 1 tablet by mouth daily.   Yes [provider]  NON FORMULARY Pt uses a cpap nightly   Yes [provider]  Semaglutide-Weight Management (WEGOVY) 0.25 MG/0.5ML SOAJ Inject 0.25 mg into the skin once a week. 02/11/23  Yes Gilmore Laroche, FNP  Vitamin D, Ergocalciferol, (DRISDOL) 1.25 MG (50000 UNIT) CAPS capsule Take 1 capsule (50,000 Units total) by mouth every 7 (seven) days. 01/19/23  Yes Gilmore Laroche, FNP  cyclobenzaprine (FLEXERIL) 10 MG tablet Take 1 tablet (10 mg total) by mouth every 8 (eight) hours as needed for muscle spasms. 02/03/23   Lazaro Arms, MD  EPINEPHrine (EPIPEN 2-PAK) 0.3 mg/0.3 mL IJ SOAJ injection Inject 0.3 mg into the muscle as needed for anaphylaxis. 02/16/23  Alfonse Spruce, MD  famotidine (PEPCID) 40 MG tablet Take 1 tablet (40 mg total) by mouth in the morning and at bedtime. 02/02/23   Alfonse Spruce, MD  ibuprofen (ADVIL) 600 MG tablet Take 1 tablet (600 mg total) by mouth every 6 (six) hours. 01/22/23   Lazaro Arms, MD  montelukast (SINGULAIR) 10 MG tablet Take 1 tablet (10 mg total) by mouth at bedtime. 10/27/22   Hetty Blend, FNP  oxyCODONE (OXY IR/ROXICODONE) 5 MG immediate release tablet Take 1-2 tablets (5-10 mg total) by mouth every 4 (four) hours as needed for moderate pain. 01/22/23   Lazaro Arms, MD  SUMAtriptan (IMITREX) 100 MG tablet Take 1 tablet (100 mg total) by mouth every 2 (two) hours as needed for migraine. May repeat in 2 hours if headache persists or recurs. 12/20/22   Anabel Halon, MD  apixaban (ELIQUIS) 5 MG TABS tablet Take 2 tablets (10mg ) twice daily for 7 days, then 1 tablet (5mg ) twice daily 04/20/19 04/20/19  Michela Pitcher A, PA-C  levonorgestrel (MIRENA) 20 MCG/24HR IUD 1 each by Intrauterine route once.   06/25/20  [provider]    Family History Family History  Problem Relation Age of  Onset   Cirrhosis Mother    Diabetes Mother    Cancer Maternal Grandfather    Hypertension Sister    Diabetes Sister    Anesthesia problems Neg Hx    Hypotension Neg Hx    Malignant hyperthermia Neg Hx    Pseudochol deficiency Neg Hx     Social History Social History   Tobacco Use   Smoking status: Never   Smokeless tobacco: Never   Tobacco comments:    socially  Vaping Use   Vaping status: Never Used  Substance Use Topics   Alcohol use: Not Currently   Drug use: Not Currently    Types: Marijuana     Allergies   Shellfish allergy, Sulfa antibiotics, and Adhesive [tape]   Review of Systems Review of Systems Per HPI  Physical Exam Triage Vital Signs ED Triage Vitals [03/28/23 1655]  Encounter Vitals Group     BP 130/82     Systolic BP Percentile      Diastolic BP Percentile      Pulse Rate 75     Resp 18     Temp 98.3 F (36.8 C)     Temp Source Oral     SpO2 98 %     Weight      Height      Head Circumference      Peak Flow      Pain Score 0     Pain Loc      Pain Education      Exclude from Growth Chart    No data found.  Updated Vital Signs BP 130/82 (BP Location: Right Arm)   Pulse 75   Temp 98.3 F (36.8 C) (Oral)   Resp 18   LMP 12/26/2022 (Approximate)   SpO2 98%   Visual Acuity Right Eye Distance:   Left Eye Distance:   Bilateral Distance:    Right Eye Near:   Left Eye Near:    Bilateral Near:     Physical Exam Vitals and nursing note reviewed.  Constitutional:      Appearance: Normal appearance.  HENT:     Head: Atraumatic.     Right Ear: Tympanic membrane and external ear normal.     Left Ear: Tympanic membrane and  external ear normal.     Nose: Rhinorrhea present.     Mouth/Throat:     Mouth: Mucous membranes are moist.     Pharynx: Posterior oropharyngeal erythema present.  Eyes:     Extraocular Movements: Extraocular movements intact.     Conjunctiva/sclera: Conjunctivae normal.  Cardiovascular:     Rate and  Rhythm: Normal rate and regular rhythm.     Heart sounds: Normal heart sounds.  Pulmonary:     Effort: Pulmonary effort is normal.     Breath sounds: Normal breath sounds. No wheezing or rales.  Musculoskeletal:        General: Normal range of motion.     Cervical back: Normal range of motion and neck supple.  Skin:    General: Skin is warm and dry.  Neurological:     Mental Status: She is alert and oriented to person, place, and time.  Psychiatric:        Mood and Affect: Mood normal.        Thought Content: Thought content normal.      UC Treatments / Results  Labs (all labs ordered are listed, but only abnormal results are displayed) Labs Reviewed - No data to display  EKG   Radiology No results found.  Procedures Procedures (including critical care time)  Medications Ordered in UC Medications - No data to display  Initial Impression / Assessment and Plan / UC Course  I have reviewed the triage vital signs and the nursing notes.  Pertinent labs & imaging results that were available during my care of the patient were reviewed by me and considered in my medical decision making (see chart for details).     Vitals and exam reassuring today, appears to be resolving without complication from COVID.  Reassurance given, discussed that retesting was not necessary.  Return for worsening symptoms.  Final Clinical Impressions(s) / UC Diagnoses   Final diagnoses:  COVID-19   Discharge Instructions   None    ED Prescriptions   None    PDMP not reviewed this encounter.   Particia Nearing, [place] 03/29/23 1954

## 2023-03-30 ENCOUNTER — Other Ambulatory Visit: Payer: Self-pay | Admitting: Family Medicine

## 2023-03-30 DIAGNOSIS — U071 COVID-19: Secondary | ICD-10-CM

## 2023-03-30 MED ORDER — NIRMATRELVIR/RITONAVIR (PAXLOVID)TABLET
3.0000 | ORAL_TABLET | Freq: Two times a day (BID) | ORAL | 0 refills | Status: AC
Start: 2023-03-30 — End: 2023-04-04

## 2023-03-30 NOTE — Telephone Encounter (Signed)
Please inform the patient that a prescription for Paxlovid, an antiviral used to treat COVID, has been sent to her pharmacy. Inform the patient not to take oxycodone while on this treatment regimen, as the combination can increase the risk of central nervous system and respiratory depression. Take Tylenol as needed for pain relief.

## 2023-03-30 NOTE — Telephone Encounter (Signed)
Pt informed

## 2023-04-04 ENCOUNTER — Telehealth: Payer: Self-pay | Admitting: *Deleted

## 2023-04-04 NOTE — Telephone Encounter (Signed)
Called patient and advised change to [employer] due to her Ins and will need to give ok for them to send her next dose of Xolair

## 2023-04-04 NOTE — Telephone Encounter (Signed)
[facility] pharmacy called and advised patient ins change now will need approval from [employer] and Rx sent to same to fill

## 2023-04-08 ENCOUNTER — Telehealth: Payer: Self-pay | Admitting: Family Medicine

## 2023-04-08 NOTE — Telephone Encounter (Signed)
Prescription Request  04/08/2023  LOV: 02/11/2023  What is the name of the medication or equipment? Semaglutide-Weight Management (WEGOVY) 0.25 MG/0.5ML Ivory Broad [[id number]]    Have you contacted your pharmacy to request a refill? No   Which pharmacy would you like this sent to?  [facility] - [place], [place] - [address] [place] [address] Phone: [phone] Fax: [phone]    Patient notified that their request is being sent to the clinical staff for review and that they should receive a response within 2 business days.   Please advise at Mobile [phone] (mobile)

## 2023-04-08 NOTE — Telephone Encounter (Signed)
New prior initiated on covermymeds under [employer]

## 2023-04-11 ENCOUNTER — Ambulatory Visit (INDEPENDENT_AMBULATORY_CARE_PROVIDER_SITE_OTHER): Payer: 59 | Admitting: Internal Medicine

## 2023-04-11 ENCOUNTER — Other Ambulatory Visit: Payer: Self-pay

## 2023-04-11 ENCOUNTER — Encounter: Payer: Self-pay | Admitting: Internal Medicine

## 2023-04-11 ENCOUNTER — Ambulatory Visit: Payer: 59

## 2023-04-11 VITALS — BP 112/62 | HR 84 | Temp 98.1°F | Ht 61.61 in | Wt 352.2 lb

## 2023-04-11 DIAGNOSIS — J3089 Other allergic rhinitis: Secondary | ICD-10-CM | POA: Diagnosis not present

## 2023-04-11 DIAGNOSIS — L501 Idiopathic urticaria: Secondary | ICD-10-CM | POA: Diagnosis not present

## 2023-04-11 DIAGNOSIS — J302 Other seasonal allergic rhinitis: Secondary | ICD-10-CM

## 2023-04-11 MED ORDER — LEVOCETIRIZINE DIHYDROCHLORIDE 5 MG PO TABS: 5.0000 mg | ORAL_TABLET | Freq: Two times a day (BID) | ORAL | 5 refills | Status: DC | PRN

## 2023-04-11 MED ORDER — TRIAMCINOLONE ACETONIDE 0.1 % EX OINT: TOPICAL_OINTMENT | CUTANEOUS | 3 refills | Status: AC

## 2023-04-11 NOTE — Progress Notes (Signed)
FOLLOW UP Date of Service/Encounter:  04/11/23   Subjective:  Jessica Sanchez (DOB: 1985-10-20) is a 37 y.o. female who [facility] for follow up for an acute visit for skin break outs.  History obtained from: chart review and patient. Last seen on 02/02/2023: with Dr Dellis Anes for asthma, allergic rhinitis, urticaria. Discussed starting Xolair.  Also on Trelegy, PRN Albuterol, Xyzal/Pepcid.    Since last visit, she reports having trouble with new rash outbreak on bilateral arms. Also feels itchy on neck and back.  Rash is fine, pinpoint bumps.  She works at [employer] and not sure if coming into contact with different plants.  Does not wear gloves at work. Also was at family members house who had cats but they did not lick her. Not on any anti histamines.  In terms of hives, has received Xolair on 10/16 and 11/11.  This is helping with the hives.   Additionally, her allergies are doing fine, not much congestion/drainage.  Her skin is the biggest issue. Not on any nose sprays.   Past Medical History: Past Medical History:  Diagnosis Date   Angio-edema    Anxiety    Arthritis    right shoulder   Asthma    Diabetes mellitus without complication (HCC)    Dyspnea    Dysrhythmia    hx of SVT   Fibroid (bleeding) (uterine)    Fibroid, uterine    H/O metrorrhagia    Hypertension    IUD (intrauterine device) in place 07/11/2015   Menorrhagia    Obesity, Class III, BMI 40-49.9 (morbid obesity) (HCC) 11/11/2018   Sleep apnea    uses CPAP - setting 11   SVT (supraventricular tachycardia) (HCC)     Objective:  BP 112/62   Pulse 84   Temp 98.1 F (36.7 C) (Temporal)   Ht 5' 1.61" (1.565 m)   Wt (!) 352 lb 3.2 oz (159.8 kg)   LMP 12/26/2022 (Approximate)   SpO2 95%   BMI 65.23 kg/m  Body mass index is 65.23 kg/m. Physical Exam: GEN: alert, well developed HEENT: clear conjunctiva, nose with mild inferior turbinate hypertrophy, pink  nasal mucosa, slight clear rhinorrhea HEART: regular rate and rhythm, no murmur LUNGS: clear to auscultation bilaterally, no coughing, unlabored respiration SKIN: no hives on exam.  Few pinpoint papular lesions on bl inner arms   Assessment:   1. Idiopathic urticaria   2. Seasonal and perennial allergic rhinitis     Plan/Recommendations:   Rash:  - Unclear etiology, will try topical steroid to see if this helps. Not consistent with hives.  - Do a daily soaking tub bath in warm water for 10-15 minutes.  - Use a gentle, unscented cleanser at the end of the bath (such as Dove unscented bar or baby wash, or Aveeno sensitive body wash). Then rinse, pat half-way dry, and apply a gentle, unscented moisturizer cream or ointment (Cerave, Cetaphil, Eucerin, Aveeno, Vaseline)  all over while still damp. Dry skin makes the itching worse. The skin should be moisturized with a gentle, unscented moisturizer at least twice daily.  - Use only unscented liquid laundry detergent. - Apply prescribed topical steroid (triamcinolone 0.1% below neck) to flared areas (red and thickened eczema) after the moisturizer has soaked into the skin (wait at least 30 minutes). Taper off the topical steroids as the skin improves. Do not use topical steroid for more than 10 days at a time.  - If no  improvement, will refer to Dermatology.    Chronic Urticaria - Use Xyzal (Levocetirirzine) 5mg  twice daily as needed for itching/hives.  - Continue Xolair 300mg  every 4 weeks.  Keep Epipen for shot visits.   Seasonal and perennial allergic rhinitis - SPT 12/2021: positive to grasses, ragweed, weeds, trees, indoor molds, outdoor molds, dust mites, cat, dog, cockroach, and tobacco. - Consider nasal saline rinses 1-2 times daily to remove allergens from the nasal cavities as well as help with mucous clearance (this is especially helpful to do before the nasal sprays are given) - Use Xyzal (Levocetirirzine) 5mg  twice daily as needed  for itching/hives/allergies.  - Consider allergy shots as a means of long-term control.  Mild persistent asthma, uncomplicated - Daily controller medication(s): Trelegy one puff once daily - Prior to physical activity: albuterol 2 puffs 10-15 minutes before physical activity. - Rescue medications: albuterol 4 puffs every 4-6 hours as needed - Asthma control goals:  * Full participation in all desired activities (may need albuterol before activity) * Albuterol use two time or less a week on average (not counting use with activity) * Cough interfering with sleep two time or less a month * Oral steroids no more than once a year * No hospitalizations  Keep follow up in January.   Alesia Morin, MD [facility]

## 2023-04-11 NOTE — Patient Instructions (Addendum)
Rash:  - Do a daily soaking tub bath in warm water for 10-15 minutes.  - Use a gentle, unscented cleanser at the end of the bath (such as Dove unscented bar or baby wash, or Aveeno sensitive body wash). Then rinse, pat half-way dry, and apply a gentle, unscented moisturizer cream or ointment (Cerave, Cetaphil, Eucerin, Aveeno, Vaseline)  all over while still damp. Dry skin makes the itching worse. The skin should be moisturized with a gentle, unscented moisturizer at least twice daily.  - Use only unscented liquid laundry detergent. - Apply prescribed topical steroid (triamcinolone 0.1% below neck) to flared areas (red and thickened eczema) after the moisturizer has soaked into the skin (wait at least 30 minutes). Taper off the topical steroids as the skin improves. Do not use topical steroid for more than 10 days at a time.  - Use Xyzal (Levocetirirzine) 5mg  twice daily as needed for itching/hives.  - If no improvement, will refer to Dermatology.    Chronic Urticaria - Use Xyzal (Levocetirirzine) 5mg  twice daily as needed for itching/hives.  - Continue Xolair 300mg  every 4 weeks.  Keep Epipen for shot visits.   Seasonal and perennial allergic rhinitis - SPT 12/2021: positive to grasses, ragweed, weeds, trees, indoor molds, outdoor molds, dust mites, cat, dog, cockroach, and tobacco . - Consider nasal saline rinses 1-2 times daily to remove allergens from the nasal cavities as well as help with mucous clearance (this is especially helpful to do before the nasal sprays are given) - Use Xyzal (Levocetirirzine) 5mg  twice daily as needed for itching/hives/allergies.  - Consider allergy shots as a means of long-term control.  Mild persistent asthma, uncomplicated - Daily controller medication(s): Trelegy one puff once daily - Prior to physical activity: albuterol 2 puffs 10-15 minutes before physical activity. - Rescue medications: albuterol 4 puffs every 4-6 hours as needed - Asthma control  goals:  * Full participation in all desired activities (may need albuterol before activity) * Albuterol use two time or less a week on average (not counting use with activity) * Cough interfering with sleep two time or less a month * Oral steroids no more than once a year * No hospitalizations

## 2023-04-15 DIAGNOSIS — G4733 Obstructive sleep apnea (adult) (pediatric): Secondary | ICD-10-CM | POA: Diagnosis not present

## 2023-04-29 ENCOUNTER — Other Ambulatory Visit: Payer: Self-pay

## 2023-04-29 MED ORDER — WEGOVY 0.25 MG/0.5ML ~~LOC~~ SOAJ: 0.2500 mg | SUBCUTANEOUS | 0 refills | Status: DC

## 2023-04-29 NOTE — Telephone Encounter (Signed)
Copied from CRM [id number]. Topic: Clinical - Prescription Issue >> Apr 29, 2023 11:17 AM Dennison Nancy wrote: Reason for CRM: Elease Hashimoto with [facility] calling on patient behalf  patient request to get the Semaglutide-Weight Management [facility]) 0.25 MG/0.5ML SOAJ refill and patient did contact the pharmacist  Please call Elease Hashimoto [phone] to followup

## 2023-04-29 NOTE — Telephone Encounter (Signed)
Refill has been sent.  °

## 2023-04-30 ENCOUNTER — Other Ambulatory Visit: Payer: Self-pay

## 2023-04-30 ENCOUNTER — Encounter (HOSPITAL_COMMUNITY): Payer: Self-pay

## 2023-04-30 ENCOUNTER — Emergency Department (HOSPITAL_COMMUNITY)
Admission: EM | Admit: 2023-04-30 | Discharge: 2023-04-30 | Discharge status: Against Medical Advice | Payer: 59 | Attending: Emergency Medicine | Admitting: Emergency Medicine

## 2023-04-30 DIAGNOSIS — R0602 Shortness of breath: Secondary | ICD-10-CM | POA: Diagnosis not present

## 2023-04-30 DIAGNOSIS — G43909 Migraine, unspecified, not intractable, without status migrainosus: Secondary | ICD-10-CM | POA: Insufficient documentation

## 2023-04-30 DIAGNOSIS — Z5321 Procedure and treatment not carried out due to patient leaving prior to being seen by health care provider: Secondary | ICD-10-CM | POA: Diagnosis not present

## 2023-04-30 LAB — RESP PANEL BY RT-PCR (RSV, FLU A&B, COVID)  RVPGX2
Influenza A by PCR: NEGATIVE
Influenza B by PCR: NEGATIVE
Resp Syncytial Virus by PCR: NEGATIVE
SARS Coronavirus 2 by RT PCR: NEGATIVE

## 2023-04-30 NOTE — ED Triage Notes (Signed)
Pt arrives with c/o shortness breath and headache. States she went to work and became jittery with chest pain. Also reports feeling dizzy. Symptoms started today. Denies any sick contacts.

## 2023-04-30 NOTE — ED Provider Triage Note (Cosign Needed)
Emergency Medicine Provider Triage Evaluation Note  Jessica Sanchez , a 37 y.o. female  was evaluated in triage.  Pt complains of racing heart.  She has a past medical history of migraine headaches and SVT.  Patient states that she woke with a migraine headache, took sumatriptan and was feeling better.  While at work she had sudden onset of racing heart.  She went into her break room and try to get her heart rate to slow down by bearing down, slow breathing but could not get it to stop.  She states it lasted approximately 2 hours.  She was markedly short of breath and felt like she was going to pass out.  She states that her heart rate was extremely elevated.  Her heart rate has improved a lot however she continues to feel lightheaded and having chest pressure..  Review of Systems  Positive: Racing heart Negative: Chest pain  Physical Exam  BP (!) 142/74 (BP Location: Right Arm)   Pulse 82   Temp 98.9 F (37.2 C) (Oral)   Resp (!) 24   Ht 5\' 1"  (1.549 m)   Wt (!) 159.7 kg   LMP 12/26/2022 (Approximate)   SpO2 98%   BMI 66.51 kg/m  Gen:   Awake, no distress   Resp:  Normal effort  MSK:   Moves extremities without difficulty  Other:    Medical Decision Making  Medically screening exam initiated at 6:06 PM.  Appropriate orders placed.  Jessica Sanchez was informed that the remainder of the evaluation will be completed by another provider, this initial triage assessment does not replace that evaluation, and the importance of remaining in the ED until their evaluation is complete.     Arthor Captain, PA-C 04/30/23 1807

## 2023-05-02 ENCOUNTER — Telehealth: Payer: Self-pay | Admitting: Family Medicine

## 2023-05-02 NOTE — Telephone Encounter (Signed)
Copied from CRM [id number]. Topic: Referral - Request for Referral >> Apr 29, 2023 11:24 AM Dennison Nancy wrote: Did the patient discuss referral with their provider in the last year? N/A (If No - schedule appointment) (If Yes - send message)  Appointment offered? N/A  Type of order/referral and detailed reason for visit: Patrica with [facility] called and relating message that patient was interested in a referral a  bariatric for weight loss   Preference of office, provider, location: N/A  If referral order, have you been seen by this specialty before? N/A (If Yes, this issue or another issue? When? Where?  Can we respond through MyChart? N/A

## 2023-05-03 ENCOUNTER — Other Ambulatory Visit: Payer: Self-pay | Admitting: Family Medicine

## 2023-05-03 NOTE — Telephone Encounter (Signed)
Referral placed.

## 2023-05-09 ENCOUNTER — Ambulatory Visit: Payer: 59

## 2023-05-12 NOTE — Progress Notes (Deleted)
   [address] [place] [address] Dept: [phone]  FOLLOW UP NOTE  Patient ID: Jessica Sanchez, female    DOB: Sep 18, 1985  Age: 38 y.o. MRN: [id number] Date of Office Visit: 05/13/2023  Assessment  Chief Complaint: No chief complaint on file.  HPI Jessica Sanchez is a 38 year old female who presents to the clinic for follow-up visit.  She was last seen in this clinic on 04/11/2023 by Dr. Tobie for evaluation of asthma, allergic rhinitis, urticaria, and new rash occurring on her arms.  In the interim, she visited the emergency department on 04/30/2023 after taking sumatriptan  hand for migraine for evaluation of racing heart.  Her last environmental allergy  skin testing was on 12/2021 and was positive to grass pollen, ragweed pollen, weed pollen, mold, dust mite, cat, dog, cockroach, and tobacco.  Discussed the use of AI scribe software for clinical note transcription with the patient, who gave verbal consent to proceed.  History of Present Illness             Drug Allergies:  Allergies  Allergen Reactions   Shellfish Allergy  Shortness Of Breath, Itching and Swelling    Mouth became swollen, throat started itching, and developed shortness of breath   Sulfa  Antibiotics Itching, Shortness Of Breath and Swelling    Mouth became swollen, throat started itching, and developed shortness of breath   Adhesive [Tape] Itching    Depends on the type of medical tape    Physical Exam: LMP 12/26/2022 (Approximate)    Physical Exam  Diagnostics:    Assessment and Plan: No diagnosis found.  No orders of the defined types were placed in this encounter.   There are no Patient Instructions on file for this visit.  No follow-ups on file.    Thank you for the opportunity to care for this patient.  Please do not hesitate to contact me with questions.  Arlean Mutter, FNP [facility]

## 2023-05-13 ENCOUNTER — Ambulatory Visit: Payer: 59

## 2023-05-13 ENCOUNTER — Ambulatory Visit: Payer: No Typology Code available for payment source | Admitting: Family Medicine

## 2023-05-16 ENCOUNTER — Ambulatory Visit (INDEPENDENT_AMBULATORY_CARE_PROVIDER_SITE_OTHER): Payer: 59 | Admitting: Family Medicine

## 2023-05-16 ENCOUNTER — Encounter: Payer: Self-pay | Admitting: Family Medicine

## 2023-05-16 DIAGNOSIS — Z6841 Body Mass Index (BMI) 40.0 and over, adult: Secondary | ICD-10-CM

## 2023-05-16 DIAGNOSIS — G4733 Obstructive sleep apnea (adult) (pediatric): Secondary | ICD-10-CM | POA: Diagnosis not present

## 2023-05-16 NOTE — Progress Notes (Signed)
 Acute Office Visit  Subjective:    Patient ID: Jessica Sanchez, female    DOB: 02/21/1986, 38 y.o.   MRN: [id number]  Chief Complaint  Patient presents with   Medical Management of Chronic Issues    Wt. Loss concerns. Would like to be considered for bariatric surgery. Not seeing results w/ diet changes and injection.     HPI The patient is here today with a request for a referral to general surgery for bariatric surgery. She reports that she has implemented dietary changes, increased physical activity, and has been working, but has experienced minimal changes in her weight. She reports feeling depressed, withdrawn from others, and stating that she doesn't want to go out or look at her reflection in the mirror due to her weight gain. She denies suicidal thoughts or ideation.   Past Medical History:  Diagnosis Date   Angio-edema    Anxiety    Arthritis    right shoulder   Asthma    Diabetes mellitus without complication (HCC)    Dyspnea    Dysrhythmia    hx of SVT   Fibroid (bleeding) (uterine)    Fibroid, uterine    H/O metrorrhagia    Hypertension    IUD (intrauterine device) in place 07/11/2015   Menorrhagia    Obesity, Class III, BMI 40-49.9 (morbid obesity) (HCC) 11/11/2018   Sleep apnea    uses CPAP - setting 11   SVT (supraventricular tachycardia) (HCC)     Past Surgical History:  Procedure Laterality Date   DILATATION AND CURETTAGE/HYSTEROSCOPY WITH MINERVA N/A 12/01/2021   Procedure: DILATATION AND CURETTAGE/HYSTEROSCOPY WITH MINERVA;  Surgeon: Jayne Vonn DEL, MD;  Location: [facility];  Service: Gynecology;  Laterality: N/A;   HYSTERECTOMY ABDOMINAL WITH SALPINGECTOMY Bilateral 01/19/2023   Procedure: HYSTERECTOMY ABDOMINAL WITH SALPINGECTOMY;  Surgeon: Jayne Vonn DEL, MD;  Location: [facility];  Service: Gynecology;  Laterality: Bilateral;   HYSTEROSCOPY N/A 09/17/2020   Procedure: HYSTEROSCOPY;  Surgeon: Jayne Vonn DEL, MD;  Location: [facility];  Service: Gynecology;   Laterality: N/A;   HYSTEROSCOPY WITH D & C N/A 09/27/2013   Procedure: DILATATION AND CURETTAGE /HYSTEROSCOPY and insertion of Mirena  IUD ;  Surgeon: Marjorie DEL. Okey, MD;  Location: [facility];  Service: Gynecology;  Laterality: N/A;   IUD REMOVAL N/A 09/17/2020   Procedure: INTRAUTERINE DEVICE (IUD) REMOVAL (2);  Surgeon: Jayne Vonn DEL, MD;  Location: [facility];  Service: Gynecology;  Laterality: N/A;   LAPAROSCOPY N/A 01/19/2023   Procedure: LAPAROSCOPY DIAGNOSTIC;  Surgeon: Jayne Vonn DEL, MD;  Location: [facility];  Service: Gynecology;  Laterality: N/A;   MYOMECTOMY  02/03/2011   Procedure: MYOMECTOMY;  Surgeon: Vonn DEL Jayne, MD;  Location: [facility];  Service: Gynecology;  Laterality: N/A;   SVT ABLATION N/A 11/08/2018   Procedure: SVT ABLATION;  Surgeon: Waddell Danelle ORN, MD;  [facility];  Service: Cardiovascular;  Laterality: N/A;   SVT ABLATION N/A 12/26/2020   Procedure: SVT ABLATION;  Surgeon: Waddell Danelle ORN, MD;  Location: [facility] INVASIVE CV LAB;  Service: Cardiovascular;  Laterality: N/A;   TONSILLECTOMY     TYMPANOSTOMY TUBE PLACEMENT      Family History  Problem Relation Age of Onset   Cirrhosis Mother    Diabetes Mother    Cancer Maternal Grandfather    Hypertension Sister    Diabetes Sister    Anesthesia problems Neg Hx    Hypotension Neg Hx    Malignant hyperthermia Neg Hx  Pseudochol deficiency Neg Hx     Social History   Socioeconomic History   Marital status: Single    Spouse name: Not on file   Number of children: 0   Years of education: Not on file   Highest education level: 12th grade  Occupational History   Not on file  Tobacco Use   Smoking status: Never   Smokeless tobacco: Never   Tobacco comments:    socially  Vaping Use   Vaping status: Never Used  Substance and Sexual Activity   Alcohol use: Not Currently   Drug use: Not Currently    Types: Marijuana   Sexual activity: Not Currently    Birth control/protection: Surgical    Comment:  hyst  Other Topics Concern   Not on file  Social History Narrative   Not on file   Social Drivers of Health   Financial Resource Strain: Low Risk  (05/15/2023)   Overall Financial Resource Strain (CARDIA)    Difficulty of Paying Living Expenses: Not very hard  Food Insecurity: No Food Insecurity (05/15/2023)   Hunger Vital Sign    Worried About Running Out of Food in the Last Year: Never true    Ran Out of Food in the Last Year: Never true  Transportation Needs: No Transportation Needs (05/15/2023)   PRAPARE - Administrator, Civil Service (Medical): No    Lack of Transportation (Non-Medical): No  Physical Activity: Sufficiently Active (05/15/2023)   Exercise Vital Sign    Days of Exercise per Week: 5 days    Minutes of Exercise per Session: 30 min  Stress: Stress Concern Present (05/15/2023)   [employer] of Occupational Health - Occupational Stress Questionnaire    Feeling of Stress : Rather much  Social Connections: Moderately Isolated (05/15/2023)   Social Connection and Isolation Panel [NHANES]    Frequency of Communication with Friends and Family: More than three times a week    Frequency of Social Gatherings with Friends and Family: Once a week    Attends Religious Services: More than 4 times per year    Active Member of [employer] or Organizations: No    Attends Engineer, Structural: Not on file    Marital Status: Never married  Intimate Partner Violence: Not At Risk (01/19/2023)   Humiliation, Afraid, Rape, and Kick questionnaire    Fear of Current or Ex-Partner: No    Emotionally Abused: No    Physically Abused: No    Sexually Abused: No    Outpatient Medications Prior to Visit  Medication Sig Dispense Refill   albuterol  (VENTOLIN  HFA) 108 (90 Base) MCG/ACT inhaler Inhale 2 puffs into the lungs every 6 (six) hours as needed for wheezing or shortness of breath. 8 g 0   amlodipine -olmesartan  (AZOR ) 10-20 MG tablet TAKE ONE TABLET BY MOUTH EVERY DAY  30 tablet 1   Blood Glucose Monitoring Suppl DEVI 1 each by Does not apply route in the morning, at noon, and at bedtime. May substitute to any manufacturer covered by patient's insurance. 1 each 0   cyclobenzaprine  (FLEXERIL ) 10 MG tablet Take 1 tablet (10 mg total) by mouth every 8 (eight) hours as needed for muscle spasms. 30 tablet 1   EPINEPHrine  (EPIPEN  2-PAK) 0.3 mg/0.3 mL IJ SOAJ injection Inject 0.3 mg into the muscle as needed for anaphylaxis. 2 each 1   famotidine  (PEPCID ) 40 MG tablet Take 1 tablet (40 mg total) by mouth in the morning and at bedtime. 60 tablet 5  Fluticasone -Umeclidin-Vilant (TRELEGY ELLIPTA ) 200-62.5-25 MCG/ACT AEPB Inhale 1 puff into the lungs daily. 28 each 5   gabapentin  (NEURONTIN ) 100 MG capsule Take 1 capsule (100 mg total) by mouth at bedtime. (Patient taking differently: Take 100 mg by mouth daily as needed (pain).) 90 capsule 1   ibuprofen  (ADVIL ) 600 MG tablet Take 1 tablet (600 mg total) by mouth every 6 (six) hours. 30 tablet 0   levocetirizine (XYZAL ) 5 MG tablet Take 1 tablet (5 mg total) by mouth 2 (two) times daily as needed for allergies (hives/itching). 60 tablet 5   metFORMIN  (GLUCOPHAGE ) 1000 MG tablet Take 0.5 tablets (500 mg total) by mouth 2 (two) times daily with a meal. 180 tablet 3   montelukast  (SINGULAIR ) 10 MG tablet Take 1 tablet (10 mg total) by mouth at bedtime. 30 tablet 2   Multiple Vitamins-Calcium (ONE-A-DAY WOMENS PO) Take 1 tablet by mouth daily.     NON FORMULARY Pt uses a cpap nightly     Semaglutide -Weight Management (WEGOVY ) 0.25 MG/0.5ML SOAJ Inject 0.25 mg into the skin once a week. 2 mL 0   SUMAtriptan  (IMITREX ) 100 MG tablet Take 1 tablet (100 mg total) by mouth every 2 (two) hours as needed for migraine. May repeat in 2 hours if headache persists or recurs. 10 tablet 2   triamcinolone  ointment (KENALOG ) 0.1 % Apply twice daily for flare ups below neck, maximum 10 days. 80 g 3   Vitamin D , Ergocalciferol , (DRISDOL ) 1.25 MG  (50000 UNIT) CAPS capsule Take 1 capsule (50,000 Units total) by mouth every 7 (seven) days. 20 capsule 1   Facility-Administered Medications Prior to Visit  Medication Dose Route Frequency Provider Last Rate Last Admin   omalizumab  (XOLAIR ) prefilled syringe 300 mg  300 mg Subcutaneous Q28 days Iva Marty Saltness, MD   300 mg at 04/11/23 1053    Allergies  Allergen Reactions   Shellfish Allergy  Shortness Of Breath, Itching and Swelling    Mouth became swollen, throat started itching, and developed shortness of breath   Sulfa  Antibiotics Itching, Shortness Of Breath and Swelling    Mouth became swollen, throat started itching, and developed shortness of breath   Adhesive [Tape] Itching    Depends on the type of medical tape    Review of Systems  Constitutional:  Negative for chills and fever.  Eyes:  Negative for visual disturbance.  Respiratory:  Negative for chest tightness and shortness of breath.   Neurological:  Negative for dizziness and headaches.       Objective:    Physical Exam Constitutional:      Appearance: She is obese.  HENT:     Head: Normocephalic.     Mouth/Throat:     Mouth: Mucous membranes are moist.  Cardiovascular:     Rate and Rhythm: Normal rate.     Heart sounds: Normal heart sounds.  Pulmonary:     Effort: Pulmonary effort is normal.     Breath sounds: Normal breath sounds.  Neurological:     Mental Status: She is alert.     BP 123/82   Pulse 79   Ht 5' 1 (1.549 m)   Wt (!) 355 lb 1.9 oz (161.1 kg)   LMP 12/26/2022 (Approximate)   SpO2 94%   BMI 67.10 kg/m  Wt Readings from Last 3 Encounters:  05/16/23 (!) 355 lb 1.9 oz (161.1 kg)  04/30/23 (!) 352 lb (159.7 kg)  04/11/23 (!) 352 lb 3.2 oz (159.8 kg)  Assessment & Plan:  Morbid obesity (HCC) Assessment & Plan: Referral placed to general surgery for bariatric surgery. Encouraged to continue taking Wegovy  and request a medication refill for dose adjustments  monthly. Encouraged to continue on a heart-healthy diet with increased physical activity (150 minutes weekly). The patient verbalized understanding and is informed of the plan of care. Wt Readings from Last 3 Encounters:  05/16/23 (!) 355 lb 1.9 oz (161.1 kg)  04/30/23 (!) 352 lb (159.7 kg)  04/11/23 (!) 352 lb 3.2 oz (159.8 kg)     Orders: -     Ambulatory referral to General Surgery  Note: This chart has been completed using Engineer, Civil (consulting) software, and while attempts have been made to ensure accuracy, certain words and phrases may not be transcribed as intended.    Hymen Arnett, FNP

## 2023-05-16 NOTE — Assessment & Plan Note (Signed)
 Referral placed to general surgery for bariatric surgery. Encouraged to continue taking Wegovy  and request a medication refill for dose adjustments monthly. Encouraged to continue on a heart-healthy diet with increased physical activity (150 minutes weekly). The patient verbalized understanding and is informed of the plan of care. Wt Readings from Last 3 Encounters:  05/16/23 (!) 355 lb 1.9 oz (161.1 kg)  04/30/23 (!) 352 lb (159.7 kg)  04/11/23 (!) 352 lb 3.2 oz (159.8 kg)

## 2023-05-16 NOTE — Patient Instructions (Addendum)
 I appreciate the opportunity to provide care to you today!  Recommendations for Weight Loss Management:  Emphasize Lifestyle Changes: A heart-healthy diet and increased physical activity are crucial. Healthy Tips for Weight Loss: Increase Intake of Nutrient-Rich Foods: Prioritize fruits, vegetables, and whole grains. Incorporate Lean Proteins: Include chicken, fish, beans, and legumes in your diet. Choose Low-Fat Dairy Products: Opt for dairy products that are low in fat. Reduce Unhealthy Fats: Limit saturated fats, trans fatty acids, and cholesterol. Aim for Regular Physical Activity: Engage in at least 30 minutes of brisk walking or other physical activities on at least 5 days a week.    Referrals today-  general surgery  Attached with your AVS, you will find valuable resources for self-education. I highly recommend dedicating some time to thoroughly examine them.   Please continue to a heart-healthy diet and increase your physical activities. Try to exercise for at least five days a week.    It was a pleasure to see you and I look forward to continuing to work together on your health and well-being. Please do not hesitate to call the office if you need care or have questions about your care.  In case of emergency, please visit the Emergency Department for urgent care, or contact our clinic at [phone] to schedule an appointment. We're here to help you!   Have a wonderful day and week. With Gratitude, Rheagan Nayak MSN, FNP-BC

## 2023-05-18 ENCOUNTER — Ambulatory Visit: Payer: 59

## 2023-05-26 NOTE — Telephone Encounter (Signed)
[facility] closed Rx due to patient no response

## 2023-06-02 ENCOUNTER — Other Ambulatory Visit: Payer: Self-pay | Admitting: Family Medicine

## 2023-06-02 ENCOUNTER — Telehealth: Payer: Self-pay

## 2023-06-02 MED ORDER — WEGOVY 0.5 MG/0.5ML ~~LOC~~ SOAJ: 0.5000 mg | SUBCUTANEOUS | 0 refills | Status: DC

## 2023-06-02 NOTE — Telephone Encounter (Signed)
Copied from CRM [id number]. Topic: Clinical - Prescription Issue >> Jun 02, 2023  9:17 AM Jorje Guild R wrote: Reason for CRM: Patient was advised to let Physician know when done with [facility] medication so she can up the dosage. Was advised to get contact directly. Message didn't go through on MyChart. Patient wants to hear from PCP or Nurse on next steps.

## 2023-06-02 NOTE — Telephone Encounter (Signed)
Rx sent

## 2023-06-13 ENCOUNTER — Encounter: Payer: Self-pay | Admitting: Family Medicine

## 2023-06-13 ENCOUNTER — Ambulatory Visit (INDEPENDENT_AMBULATORY_CARE_PROVIDER_SITE_OTHER): Payer: 59 | Admitting: Family Medicine

## 2023-06-13 DIAGNOSIS — E559 Vitamin D deficiency, unspecified: Secondary | ICD-10-CM

## 2023-06-13 DIAGNOSIS — R7301 Impaired fasting glucose: Secondary | ICD-10-CM

## 2023-06-13 DIAGNOSIS — Z6841 Body Mass Index (BMI) 40.0 and over, adult: Secondary | ICD-10-CM | POA: Diagnosis not present

## 2023-06-13 DIAGNOSIS — E038 Other specified hypothyroidism: Secondary | ICD-10-CM

## 2023-06-13 DIAGNOSIS — Z23 Encounter for immunization: Secondary | ICD-10-CM

## 2023-06-13 DIAGNOSIS — E7849 Other hyperlipidemia: Secondary | ICD-10-CM

## 2023-06-13 DIAGNOSIS — G609 Hereditary and idiopathic neuropathy, unspecified: Secondary | ICD-10-CM | POA: Diagnosis not present

## 2023-06-13 MED ORDER — VITAMIN B-6 50 MG PO TABS: 50.0000 mg | ORAL_TABLET | Freq: Every day | ORAL | 1 refills | Status: DC

## 2023-06-13 MED ORDER — GABAPENTIN 300 MG PO CAPS: 300.0000 mg | ORAL_CAPSULE | Freq: Three times a day (TID) | ORAL | 3 refills | Status: DC

## 2023-06-13 MED ORDER — GABAPENTIN 300 MG PO CAPS: 300.0000 mg | ORAL_CAPSULE | Freq: Every day | ORAL | 3 refills | Status: AC

## 2023-06-13 NOTE — Assessment & Plan Note (Signed)
 The dose of Gabapentin  has been increased to 300 mg at bedtime daily, and the patient has been started on Pyridoxine 50 mg daily. The patient was encouraged to increase hydration levels and to engage in regular exercise as part of her care plan. She was advised to follow up if her symptoms worsen or fail to improve.

## 2023-06-13 NOTE — Progress Notes (Signed)
 Established Patient Office Visit  Subjective:  Patient ID: Jessica Sanchez, female    DOB: February 25, 1986  Age: 38 y.o. MRN: [id number]  CC:  Chief Complaint  Patient presents with   Follow-up    4 month blacked out and fell down the steps yesterday, bruised left leg. . It is becoming a frequent issue Has been experiencing numbness and tingling in left side  Please send new referral for podiatry in [place]     HPI Jessica Sanchez is a 38 y.o. female with past medical history of prediabetes, obesity, peripheral neuropathy presents for f/u of  chronic medical conditions. For the details of today's visit, please refer to the assessment and plan.   The patient is requesting a referral to podiatry in [place]. I recommend that she call [facility] [place] to verify insurance coverage before scheduling an appointment.  Past Medical History:  Diagnosis Date   Angio-edema    Anxiety    Arthritis    right shoulder   Asthma    Diabetes mellitus without complication (HCC)    Dyspnea    Dysrhythmia    hx of SVT   Fibroid (bleeding) (uterine)    Fibroid, uterine    H/O metrorrhagia    Hypertension    IUD (intrauterine device) in place 07/11/2015   Menorrhagia    Obesity, Class III, BMI 40-49.9 (morbid obesity) (HCC) 11/11/2018   Sleep apnea    uses CPAP - setting 11   SVT (supraventricular tachycardia) (HCC)     Past Surgical History:  Procedure Laterality Date   DILATATION AND CURETTAGE/HYSTEROSCOPY WITH MINERVA N/A 12/01/2021   Procedure: DILATATION AND CURETTAGE/HYSTEROSCOPY WITH MINERVA;  Surgeon: Wendelyn Halter, MD;  Location: [facility];  Service: Gynecology;  Laterality: N/A;   HYSTERECTOMY ABDOMINAL WITH SALPINGECTOMY Bilateral 01/19/2023   Procedure: HYSTERECTOMY ABDOMINAL WITH SALPINGECTOMY;  Surgeon: Wendelyn Halter, MD;  Location: [facility];  Service: Gynecology;  Laterality: Bilateral;   HYSTEROSCOPY N/A 09/17/2020   Procedure: HYSTEROSCOPY;  Surgeon: Wendelyn Halter, MD;  Location: [facility];  Service: Gynecology;  Laterality: N/A;   HYSTEROSCOPY WITH D & C N/A 09/27/2013   Procedure: DILATATION AND CURETTAGE /HYSTEROSCOPY and insertion of Mirena  IUD ;  Surgeon: Elridge Haller. Avanell Bob, MD;  Location: [facility];  Service: Gynecology;  Laterality: N/A;   IUD REMOVAL N/A 09/17/2020   Procedure: INTRAUTERINE DEVICE (IUD) REMOVAL (2);  Surgeon: Wendelyn Halter, MD;  Location: [facility];  Service: Gynecology;  Laterality: N/A;   LAPAROSCOPY N/A 01/19/2023   Procedure: LAPAROSCOPY DIAGNOSTIC;  Surgeon: Wendelyn Halter, MD;  Location: [facility];  Service: Gynecology;  Laterality: N/A;   MYOMECTOMY  02/03/2011   Procedure: MYOMECTOMY;  Surgeon: Wendelyn Halter, MD;  Location: [facility];  Service: Gynecology;  Laterality: N/A;   SVT ABLATION N/A 11/08/2018   Procedure: SVT ABLATION;  Surgeon: Tammie Fall, MD;  Location: [facility] INVASIVE CV LAB;  Service: Cardiovascular;  Laterality: N/A;   SVT ABLATION N/A 12/26/2020   Procedure: SVT ABLATION;  Surgeon: Tammie Fall, MD;  Location: [facility] INVASIVE CV LAB;  Service: Cardiovascular;  Laterality: N/A;   TONSILLECTOMY     TYMPANOSTOMY TUBE PLACEMENT      Family History  Problem Relation Age of Onset   Cirrhosis Mother    Diabetes Mother    Cancer Maternal Grandfather    Hypertension Sister    Diabetes Sister    Anesthesia problems Neg Hx    Hypotension Neg Hx  Malignant hyperthermia Neg Hx    Pseudochol deficiency Neg Hx     Social History   Socioeconomic History   Marital status: Single    Spouse name: Not on file   Number of children: 0   Years of education: Not on file   Highest education level: 12th grade  Occupational History   Not on file  Tobacco Use   Smoking status: Never   Smokeless tobacco: Never   Tobacco comments:    socially  Vaping Use   Vaping status: Never Used  Substance and Sexual Activity   Alcohol use: Not Currently   Drug use: Not Currently    Types: Marijuana   Sexual activity: Not  Currently    Birth control/protection: Surgical    Comment: hyst  Other Topics Concern   Not on file  Social History Narrative   Not on file   Social Drivers of Health   Financial Resource Strain: Low Risk  (05/15/2023)   Overall Financial Resource Strain (CARDIA)    Difficulty of Paying Living Expenses: Not very hard  Food Insecurity: No Food Insecurity (05/15/2023)   Hunger Vital Sign    Worried About Running Out of Food in the Last Year: Never true    Ran Out of Food in the Last Year: Never true  Transportation Needs: No Transportation Needs (05/15/2023)   PRAPARE - Administrator, Civil Service (Medical): No    Lack of Transportation (Non-Medical): No  Physical Activity: Sufficiently Active (05/15/2023)   Exercise Vital Sign    Days of Exercise per Week: 5 days    Minutes of Exercise per Session: 30 min  Stress: Stress Concern Present (05/15/2023)   [employer] of Occupational Health - Occupational Stress Questionnaire    Feeling of Stress : Rather much  Social Connections: Moderately Isolated (05/15/2023)   Social Connection and Isolation Panel [NHANES]    Frequency of Communication with Friends and Family: More than three times a week    Frequency of Social Gatherings with Friends and Family: Once a week    Attends Religious Services: More than 4 times per year    Active Member of [employer] or Organizations: No    Attends Engineer, structural: Not on file    Marital Status: Never married  Intimate Partner Violence: Not At Risk (01/19/2023)   Humiliation, Afraid, Rape, and Kick questionnaire    Fear of Current or Ex-Partner: No    Emotionally Abused: No    Physically Abused: No    Sexually Abused: No    Outpatient Medications Prior to Visit  Medication Sig Dispense Refill   albuterol  (VENTOLIN  HFA) 108 (90 Base) MCG/ACT inhaler Inhale 2 puffs into the lungs every 6 (six) hours as needed for wheezing or shortness of breath. 8 g 0    amlodipine -olmesartan  (AZOR ) 10-20 MG tablet TAKE ONE TABLET BY MOUTH EVERY DAY 30 tablet 1   Blood Glucose Monitoring Suppl DEVI 1 each by Does not apply route in the morning, at noon, and at bedtime. May substitute to any manufacturer covered by patient's insurance. 1 each 0   cyclobenzaprine  (FLEXERIL ) 10 MG tablet Take 1 tablet (10 mg total) by mouth every 8 (eight) hours as needed for muscle spasms. 30 tablet 1   EPINEPHrine  (EPIPEN  2-PAK) 0.3 mg/0.3 mL IJ SOAJ injection Inject 0.3 mg into the muscle as needed for anaphylaxis. 2 each 1   famotidine  (PEPCID ) 40 MG tablet Take 1 tablet (40 mg total) by mouth in the  morning and at bedtime. 60 tablet 5   Fluticasone -Umeclidin-Vilant (TRELEGY ELLIPTA ) 200-62.5-25 MCG/ACT AEPB Inhale 1 puff into the lungs daily. 28 each 5   ibuprofen  (ADVIL ) 600 MG tablet Take 1 tablet (600 mg total) by mouth every 6 (six) hours. 30 tablet 0   levocetirizine (XYZAL ) 5 MG tablet Take 1 tablet (5 mg total) by mouth 2 (two) times daily as needed for allergies (hives/itching). 60 tablet 5   metFORMIN  (GLUCOPHAGE ) 1000 MG tablet Take 0.5 tablets (500 mg total) by mouth 2 (two) times daily with a meal. 180 tablet 3   montelukast  (SINGULAIR ) 10 MG tablet Take 1 tablet (10 mg total) by mouth at bedtime. 30 tablet 2   Multiple Vitamins-Calcium (ONE-A-DAY WOMENS PO) Take 1 tablet by mouth daily.     NON FORMULARY Pt uses a cpap nightly     Semaglutide -Weight Management (WEGOVY ) 0.5 MG/0.5ML SOAJ Inject 0.5 mg into the skin once a week. 2 mL 0   SUMAtriptan  (IMITREX ) 100 MG tablet Take 1 tablet (100 mg total) by mouth every 2 (two) hours as needed for migraine. May repeat in 2 hours if headache persists or recurs. 10 tablet 2   triamcinolone  ointment (KENALOG ) 0.1 % Apply twice daily for flare ups below neck, maximum 10 days. 80 g 3   Vitamin D , Ergocalciferol , (DRISDOL ) 1.25 MG (50000 UNIT) CAPS capsule Take 1 capsule (50,000 Units total) by mouth every 7 (seven) days. 20  capsule 1   gabapentin  (NEURONTIN ) 100 MG capsule Take 1 capsule (100 mg total) by mouth at bedtime. (Patient taking differently: Take 100 mg by mouth daily as needed (pain).) 90 capsule 1   Facility-Administered Medications Prior to Visit  Medication Dose Route Frequency Provider Last Rate Last Admin   omalizumab  (XOLAIR ) prefilled syringe 300 mg  300 mg Subcutaneous Q28 days Rochester Chuck, MD   300 mg at 04/11/23 1053    Allergies  Allergen Reactions   Shellfish Allergy  Shortness Of Breath, Itching and Swelling    Mouth became swollen, throat started itching, and developed shortness of breath   Sulfa  Antibiotics Itching, Shortness Of Breath and Swelling    Mouth became swollen, throat started itching, and developed shortness of breath   Adhesive [Tape] Itching    Depends on the type of medical tape    ROS Review of Systems  Constitutional:  Negative for chills and fever.  Eyes:  Negative for visual disturbance.  Respiratory:  Negative for chest tightness and shortness of breath.   Neurological:  Negative for dizziness and headaches.      Objective:    Physical Exam HENT:     Head: Normocephalic.     Mouth/Throat:     Mouth: Mucous membranes are moist.  Cardiovascular:     Rate and Rhythm: Normal rate.     Heart sounds: Normal heart sounds.  Pulmonary:     Effort: Pulmonary effort is normal.     Breath sounds: Normal breath sounds.  Neurological:     Mental Status: She is alert.     BP 102/69   Pulse 89   Ht 5\' 1"  (1.549 m)   Wt (!) 353 lb 1.9 oz (160.2 kg)   LMP 12/26/2022 (Approximate)   SpO2 98%   BMI 66.72 kg/m  Wt Readings from Last 3 Encounters:  06/13/23 (!) 353 lb 1.9 oz (160.2 kg)  05/16/23 (!) 355 lb 1.9 oz (161.1 kg)  04/30/23 (!) 352 lb (159.7 kg)    Lab Results  Component Value  Date   TSH 2.680 01/17/2023   Lab Results  Component Value Date   WBC 8.0 01/31/2023   HGB 10.5 (L) 01/31/2023   HCT 33.9 (L) 01/31/2023   MCV 85.4  01/31/2023   PLT 345 01/31/2023   Lab Results  Component Value Date   NA 135 01/31/2023   K 4.2 01/31/2023   CO2 26 01/31/2023   GLUCOSE 92 01/31/2023   BUN 11 01/31/2023   CREATININE 0.78 01/31/2023   BILITOT 0.6 01/22/2023   ALKPHOS 49 01/22/2023   AST 10 (L) 01/22/2023   ALT 12 01/22/2023   PROT 6.9 01/22/2023   ALBUMIN 3.2 (L) 01/22/2023   CALCIUM 9.0 01/31/2023   ANIONGAP 8 01/31/2023   EGFR 114 01/17/2023   Lab Results  Component Value Date   CHOL 151 01/17/2023   Lab Results  Component Value Date   HDL 44 01/17/2023   Lab Results  Component Value Date   LDLCALC 98 01/17/2023   Lab Results  Component Value Date   TRIG 42 01/17/2023   Lab Results  Component Value Date   CHOLHDL 3.4 01/17/2023   Lab Results  Component Value Date   HGBA1C 6.2 (H) 01/17/2023      Assessment & Plan:  Morbid obesity (HCC) Assessment & Plan: The patient is currently taking Wegovy  0.5 mg weekly and reports frequent syncope episodes since starting therapy, with the most recent episode occurring on June 01, 2023. The patient denies symptoms of chest pain, palpitations, shortness of breath, slurred speech, numbness, or weakness prior to the syncopal event. The patient is following up with cardiology, and I recommend that she call and schedule an appointment for further evaluation. For the bruised leg, I recommend rest, ice application initially, followed by heat therapy at the affected site. Range of motion and mobility exercises should be encouraged, and the neurovascular status of the affected extremity is intact.  I recommend increasing hydration, healthy snacking into monitor her blood glucose levels the patient verbalized understanding is aware plan of care   Idiopathic peripheral neuropathy Assessment & Plan: The dose of Gabapentin  has been increased to 300 mg at bedtime daily, and the patient has been started on Pyridoxine 50 mg daily. The patient was encouraged to  increase hydration levels and to engage in regular exercise as part of her care plan. She was advised to follow up if her symptoms worsen or fail to improve.     Orders: -     Vitamin B-6; Take 1 tablet (50 mg total) by mouth daily.  Dispense: 90 tablet; Refill: 1 -     Gabapentin ; Take 1 capsule (300 mg total) by mouth at bedtime.  Dispense: 90 capsule; Refill: 3  IFG (impaired fasting glucose) -     Hemoglobin A1c  Vitamin D  deficiency -     VITAMIN D  25 Hydroxy (Vit-D Deficiency, Fractures)  TSH (thyroid -stimulating hormone deficiency) -     TSH + free T4  Other hyperlipidemia -     Lipid panel -     CMP14+EGFR -     CBC with Differential/Platelet  Note: This chart has been completed using Engineer, civil (consulting) software, and while attempts have been made to ensure accuracy, certain words and phrases may not be transcribed as intended.    Follow-up: Return in about 4 months (around 10/11/2023).   Kisean Rollo, FNP

## 2023-06-13 NOTE — Patient Instructions (Addendum)
 I appreciate the opportunity to provide care to you today!    Follow up:  4 months  Labs: please stop by the lab during the week to get your blood drawn (CBC, CMP, TSH, Lipid profile, HgA1c, Vit D)  Neuropathy Start taking Gabapentin  300 mg at bedtime. Start taking Pyridoxine 50 mg daily.  Follow-Up Recommendation Follow up with cardiology for syncope and blackout spells.   Attached with your AVS, you will find valuable resources for self-education. I highly recommend dedicating some time to thoroughly examine them.   Please continue to a heart-healthy diet and increase your physical activities. Try to exercise for at least five days a week.    It was a pleasure to see you and I look forward to continuing to work together on your health and well-being. Please do not hesitate to call the office if you need care or have questions about your care.  In case of emergency, please visit the Emergency Department for urgent care, or contact our clinic at [phone] to schedule an appointment. We're here to help you!   Have a wonderful day and week. With Gratitude, Larrie Fraizer MSN, FNP-BC

## 2023-06-13 NOTE — Assessment & Plan Note (Signed)
 The patient is currently taking Wegovy  0.5 mg weekly and reports frequent syncope episodes since starting therapy, with the most recent episode occurring on June 01, 2023. The patient denies symptoms of chest pain, palpitations, shortness of breath, slurred speech, numbness, or weakness prior to the syncopal event. The patient is following up with cardiology, and I recommend that she call and schedule an appointment for further evaluation. For the bruised leg, I recommend rest, ice application initially, followed by heat therapy at the affected site. Range of motion and mobility exercises should be encouraged, and the neurovascular status of the affected extremity is intact.  I recommend increasing hydration, healthy snacking into monitor her blood glucose levels the patient verbalized understanding is aware plan of care

## 2023-06-14 ENCOUNTER — Encounter: Payer: 59 | Attending: Family Medicine | Admitting: Nutrition

## 2023-06-14 DIAGNOSIS — R7303 Prediabetes: Secondary | ICD-10-CM | POA: Diagnosis not present

## 2023-06-14 DIAGNOSIS — Z6841 Body Mass Index (BMI) 40.0 and over, adult: Secondary | ICD-10-CM | POA: Diagnosis not present

## 2023-06-14 NOTE — Progress Notes (Signed)
Medical Nutrition Therapy  Appointment Start time:  1300  Appointment End time:  1400  Primary concerns today: Severe obesity  Referral diagnosis: E66.01 Preferred learning style: No Preference  Learning readiness: Ready   NUTRITION ASSESSMENT  38 yr old bfemale referred for weight loss and Pre DM. BMI 66. A1C 5.9%. Wants to get her weight off and prevent diabetes and feel better. Has been working on eating more baked foods. Admits to emotional eating and food addiction. Has some trauma in her past and willing to see at therapist. Eating more fruits and vegetables and drinking more water recently. Sometiems snacks and may have a gingerale. Exercise: walks some  Surgery- total hystertomy for tumors on ovaries.  Not on any hormone replacement. WT before surgery- same as now. Hasn't been to gym since August. Walks twice a week, 30 minutes. Steps at work; 2000 steps. Yesterday 4,000 steps Reginal Lutes  has been on it for 2 months.    Wt Readings from Last 3 Encounters:  06/14/23 (!) 353 lb (160.1 kg)  06/13/23 (!) 353 lb 1.9 oz (160.2 kg)  05/16/23 (!) 355 lb 1.9 oz (161.1 kg)   Ht Readings from Last 3 Encounters:  06/14/23 5\' 1"  (1.549 m)  06/13/23 5\' 1"  (1.549 m)  05/16/23 5\' 1"  (1.549 m)   Body mass index is 66.7 kg/m. @BMIFA @ Facility age limit for growth %iles is 20 years. Facility age limit for growth %iles is 20 years.   Clinical    Latest Ref Rng & Units 06/17/2023   11:15 AM 01/31/2023    5:24 PM 01/22/2023    4:47 AM  CMP  Glucose 70 - 99 mg/dL 81  92  161   BUN 6 - 20 mg/dL 11  11  9    Creatinine 0.57 - 1.00 mg/dL 0.96  0.45  4.09   Sodium 134 - 144 mmol/L 140  135  134   Potassium 3.5 - 5.2 mmol/L 4.5  4.2  4.3   Chloride 96 - 106 mmol/L 104  101  98   CO2 20 - 29 mmol/L 23  26  28    Calcium 8.7 - 10.2 mg/dL 9.2  9.0  8.4   Total Protein 6.0 - 8.5 g/dL 6.7   6.9   Total Bilirubin 0.0 - 1.2 mg/dL 0.3   0.6   Alkaline Phos 44 - 121 IU/L 71   49   AST 0 - 40  IU/L 9   10   ALT 0 - 32 IU/L 10   12    Lipid Panel     Component Value Date/Time   CHOL 142 06/17/2023 1115   TRIG 44 06/17/2023 1115   HDL 40 06/17/2023 1115   CHOLHDL 3.6 06/17/2023 1115   LDLCALC 92 06/17/2023 1115   LABVLDL 10 06/17/2023 1115   Lab Results  Component Value Date   HGBA1C 5.9 (H) 06/17/2023    Medical Hx:  Past Medical History:  Diagnosis Date   Angio-edema    Anxiety    Arthritis    right shoulder   Asthma    Diabetes mellitus without complication (HCC)    Dyspnea    Dysrhythmia    hx of SVT   Fibroid (bleeding) (uterine)    Fibroid, uterine    H/O metrorrhagia    Hypertension    IUD (intrauterine device) in place 07/11/2015   Menorrhagia    Obesity, Class III, BMI 40-49.9 (morbid obesity) (HCC) 11/11/2018   Sleep apnea    uses CPAP -  setting 11   SVT (supraventricular tachycardia) (HCC)     Medications:  Current Outpatient Medications on File Prior to Visit  Medication Sig Dispense Refill   albuterol (VENTOLIN HFA) 108 (90 Base) MCG/ACT inhaler Inhale 2 puffs into the lungs every 6 (six) hours as needed for wheezing or shortness of breath. 8 g 0   amlodipine-olmesartan (AZOR) 10-20 MG tablet TAKE ONE TABLET BY MOUTH EVERY DAY 30 tablet 1   famotidine (PEPCID) 40 MG tablet Take 1 tablet (40 mg total) by mouth in the morning and at bedtime. 60 tablet 5   Fluticasone-Umeclidin-Vilant (TRELEGY ELLIPTA) 200-62.5-25 MCG/ACT AEPB Inhale 1 puff into the lungs daily. 28 each 5   gabapentin (NEURONTIN) 300 MG capsule Take 1 capsule (300 mg total) by mouth at bedtime. 90 capsule 3   levocetirizine (XYZAL) 5 MG tablet Take 1 tablet (5 mg total) by mouth 2 (two) times daily as needed for allergies (hives/itching). 60 tablet 5   metFORMIN (GLUCOPHAGE) 1000 MG tablet Take 0.5 tablets (500 mg total) by mouth 2 (two) times daily with a meal. 180 tablet 3   montelukast (SINGULAIR) 10 MG tablet Take 1 tablet (10 mg total) by mouth at bedtime. 30 tablet 2    Multiple Vitamins-Calcium (ONE-A-DAY WOMENS PO) Take 1 tablet by mouth daily.     pyridOXINE (VITAMIN B6) 50 MG tablet Take 1 tablet (50 mg total) by mouth daily. 90 tablet 1   Semaglutide-Weight Management (WEGOVY) 0.5 MG/0.5ML SOAJ Inject 0.5 mg into the skin once a week. 2 mL 0   SUMAtriptan (IMITREX) 100 MG tablet Take 1 tablet (100 mg total) by mouth every 2 (two) hours as needed for migraine. May repeat in 2 hours if headache persists or recurs. 10 tablet 2   Vitamin D, Ergocalciferol, (DRISDOL) 1.25 MG (50000 UNIT) CAPS capsule Take 1 capsule (50,000 Units total) by mouth every 7 (seven) days. 20 capsule 1   Blood Glucose Monitoring Suppl DEVI 1 each by Does not apply route in the morning, at noon, and at bedtime. May substitute to any manufacturer covered by patient's insurance. 1 each 0   cyclobenzaprine (FLEXERIL) 10 MG tablet Take 1 tablet (10 mg total) by mouth every 8 (eight) hours as needed for muscle spasms. 30 tablet 1   EPINEPHrine (EPIPEN 2-PAK) 0.3 mg/0.3 mL IJ SOAJ injection Inject 0.3 mg into the muscle as needed for anaphylaxis. 2 each 1   ibuprofen (ADVIL) 600 MG tablet Take 1 tablet (600 mg total) by mouth every 6 (six) hours. 30 tablet 0   NON FORMULARY Pt uses a cpap nightly     triamcinolone ointment (KENALOG) 0.1 % Apply twice daily for flare ups below neck, maximum 10 days. 80 g 3   [DISCONTINUED] apixaban (ELIQUIS) 5 MG TABS tablet Take 2 tablets (10mg ) twice daily for 7 days, then 1 tablet (5mg ) twice daily 20 tablet 0   [DISCONTINUED] levonorgestrel (MIRENA) 20 MCG/24HR IUD 1 each by Intrauterine route once.      Current Facility-Administered Medications on File Prior to Visit  Medication Dose Route Frequency Provider Last Rate Last Admin   omalizumab Geoffry Paradise) prefilled syringe 300 mg  300 mg Subcutaneous Q28 days Alfonse Spruce, MD   300 mg at 04/11/23 1053    Labs:  Lab Results  Component Value Date   HGBA1C 6.2 (H) 01/17/2023      Latest Ref Rng &  Units 01/31/2023    5:24 PM 01/22/2023    4:47 AM 01/20/2023  4:09 AM  CMP  Glucose 70 - 99 mg/dL 92  034  742   BUN 6 - 20 mg/dL 11  9  8    Creatinine 0.44 - 1.00 mg/dL 5.95  6.38  7.56   Sodium 135 - 145 mmol/L 135  134  133   Potassium 3.5 - 5.1 mmol/L 4.2  4.3  4.3   Chloride 98 - 111 mmol/L 101  98  99   CO2 22 - 32 mmol/L 26  28  25    Calcium 8.9 - 10.3 mg/dL 9.0  8.4  8.3   Total Protein 6.5 - 8.1 g/dL  6.9    Total Bilirubin 0.3 - 1.2 mg/dL  0.6    Alkaline Phos 38 - 126 U/L  49    AST 15 - 41 U/L  10    ALT 0 - 44 U/L  12     Lipid Panel     Component Value Date/Time   CHOL 151 01/17/2023 0952   TRIG 42 01/17/2023 0952   HDL 44 01/17/2023 0952   CHOLHDL 3.4 01/17/2023 0952   LDLCALC 98 01/17/2023 0952   LABVLDL 9 01/17/2023 0952    Notable Signs/Symptoms: Tired. Craves sweets at times. Poor sleep  Lifestyle & Dietary Hx Lives with her sister in an apartment. Working part time [employer].   Estimated daily fluid intake: 120  oz Supplements: VIt D, MVi , V6 Sleep: Bed: 10-11 pm til 10 am. Has CPAP Stress / self-care: financial Current average weekly physical activity: walks some  24-Hr Dietary Recall First Meal: 2 Boiled egg, bagel with strawberry cream cheese, Tropical fruit punch Snack:  Second Meal: parmesean chicken and noodles, 2 cups, Water Snack: yogurt, chips Third Meal:  7 pm Baked chicken, rice, green beans, water Snack: oatmeal cookie Beverages: water and minute main tropical fruit punch.  Estimated Energy Needs Calories: 1200 Carbohydrate: 135g Protein: 90g Fat: 33g   NUTRITION DIAGNOSIS  NI-1.7 Predicted excessive energy intake As related to High calorie diet.  As evidenced by BMI 66.   NUTRITION INTERVENTION  Nutrition education (E-1) on the following topics:  Nutrition and  Pre Diabetes education provided on My Plate, CHO counting, meal planning, portion sizes, timing of meals, avoiding snacks between meals unless having a  low blood sugar, target ranges for A1C and blood sugars, signs/symptoms and treatment of hyper/hypoglycemia, monitoring blood sugars, taking medications as prescribed, benefits of exercising 30 minutes per day and prevention of complications of DM. Lifestyle Medicine  - Whole Food, Plant Predominant Nutrition is highly recommended: Eat Plenty of vegetables, Mushrooms, fruits, Legumes, Whole Grains, Nuts, seeds in lieu of processed meats, processed snacks/pastries red meat, poultry, eggs.    -It is better to avoid simple carbohydrates including: Cakes, Sweet Desserts, Ice Cream, Soda (diet and regular), Sweet Tea, Candies, Chips, Cookies, Store Bought Juices, Alcohol in Excess of  1-2 drinks a day, Lemonade,  Artificial Sweeteners, Doughnuts, Coffee Creamers, "Sugar-free" Products, etc, etc.  This is not a complete list.....  Exercise: If you are able: 30 -60 minutes a day ,4 days a week, or 150 minutes a week.  The longer the better.  Combine stretch, strength, and aerobic activities.  If you were told in the past that you have high risk for cardiovascular diseases, you may seek evaluation by your heart doctor prior to initiating moderate to intense exercise programs.   Handouts Provided Include  Lifestyle Medicine   Learning Style & Readiness for Change Teaching method utilized: Visual &  Auditory  Demonstrated degree of understanding via: Teach Back  Barriers to learning/adherence to lifestyle change: emotional eating and possible depression  Goals Established by Pt Goals  Eat three meals per day. B) 6-8 L)12-2 D) 5-7 Keep drinking water Get in 7000 steps per day Talk to PCP about referral to therapist to help with mental health and emotional eating. Lose 1 lb per week. Work on [employer] and meal planning. Cut out fast food and processed foods.   MONITORING & EVALUATION Dietary intake, weekly physical activity, and weight in 1 month.  Next Steps  Patient is to work on meal  planning and getting appointment for a therapist..

## 2023-06-15 ENCOUNTER — Ambulatory Visit: Payer: No Typology Code available for payment source | Admitting: Family Medicine

## 2023-06-17 DIAGNOSIS — E038 Other specified hypothyroidism: Secondary | ICD-10-CM | POA: Diagnosis not present

## 2023-06-17 DIAGNOSIS — E559 Vitamin D deficiency, unspecified: Secondary | ICD-10-CM | POA: Diagnosis not present

## 2023-06-17 DIAGNOSIS — R7301 Impaired fasting glucose: Secondary | ICD-10-CM | POA: Diagnosis not present

## 2023-06-17 DIAGNOSIS — E7849 Other hyperlipidemia: Secondary | ICD-10-CM | POA: Diagnosis not present

## 2023-06-18 LAB — CBC WITH DIFFERENTIAL/PLATELET
Basophils Absolute: 0 10*3/uL (ref 0.0–0.2)
Basos: 1 %
EOS (ABSOLUTE): 0.1 10*3/uL (ref 0.0–0.4)
Eos: 2 %
Hematocrit: 34.3 % (ref 34.0–46.6)
Hemoglobin: 11 g/dL — ABNORMAL LOW (ref 11.1–15.9)
Immature Grans (Abs): 0 10*3/uL (ref 0.0–0.1)
Immature Granulocytes: 0 %
Lymphocytes Absolute: 2 10*3/uL (ref 0.7–3.1)
Lymphs: 33 %
MCH: 26.5 pg — ABNORMAL LOW (ref 26.6–33.0)
MCHC: 32.1 g/dL (ref 31.5–35.7)
MCV: 83 fL (ref 79–97)
Monocytes Absolute: 0.6 10*3/uL (ref 0.1–0.9)
Monocytes: 10 %
Neutrophils Absolute: 3.3 10*3/uL (ref 1.4–7.0)
Neutrophils: 54 %
Platelets: 318 10*3/uL (ref 150–450)
RBC: 4.15 x10E6/uL (ref 3.77–5.28)
RDW: 15 % (ref 11.7–15.4)
WBC: 6 10*3/uL (ref 3.4–10.8)

## 2023-06-18 LAB — CMP14+EGFR
ALT: 10 [IU]/L (ref 0–32)
AST: 9 [IU]/L (ref 0–40)
Albumin: 3.9 g/dL (ref 3.9–4.9)
Alkaline Phosphatase: 71 [IU]/L (ref 44–121)
BUN/Creatinine Ratio: 17 (ref 9–23)
BUN: 11 mg/dL (ref 6–20)
Bilirubin Total: 0.3 mg/dL (ref 0.0–1.2)
CO2: 23 mmol/L (ref 20–29)
Calcium: 9.2 mg/dL (ref 8.7–10.2)
Chloride: 104 mmol/L (ref 96–106)
Creatinine, Ser: 0.66 mg/dL (ref 0.57–1.00)
Globulin, Total: 2.8 g/dL (ref 1.5–4.5)
Glucose: 81 mg/dL (ref 70–99)
Potassium: 4.5 mmol/L (ref 3.5–5.2)
Sodium: 140 mmol/L (ref 134–144)
Total Protein: 6.7 g/dL (ref 6.0–8.5)
eGFR: 116 mL/min/{1.73_m2} (ref >59)

## 2023-06-18 LAB — LIPID PANEL
Chol/HDL Ratio: 3.6 {ratio} (ref 0.0–4.4)
Cholesterol, Total: 142 mg/dL (ref 100–199)
HDL: 40 mg/dL (ref >39)
LDL Chol Calc (NIH): 92 mg/dL (ref 0–99)
Triglycerides: 44 mg/dL (ref 0–149)
VLDL Cholesterol Cal: 10 mg/dL (ref 5–40)

## 2023-06-18 LAB — HEMOGLOBIN A1C
Est. average glucose Bld gHb Est-mCnc: 123 mg/dL
Hgb A1c MFr Bld: 5.9 % — ABNORMAL HIGH (ref 4.8–5.6)

## 2023-06-18 LAB — TSH+FREE T4
Free T4: 1.05 ng/dL (ref 0.82–1.77)
TSH: 2.46 u[IU]/mL (ref 0.450–4.500)

## 2023-06-18 LAB — VITAMIN D 25 HYDROXY (VIT D DEFICIENCY, FRACTURES): Vit D, 25-Hydroxy: 16.7 ng/mL — ABNORMAL LOW (ref 30.0–100.0)

## 2023-06-23 ENCOUNTER — Encounter: Payer: Self-pay | Admitting: Nutrition

## 2023-06-23 NOTE — Patient Instructions (Signed)
Goals  Eat three meals per day. B) 6-8 L)12-2 D) 5-7 Keep drinking water Get in 7000 steps per day Talk to PCP about referral to therapist to help with mental health and emotional eating. Lose 1 lb per week. Work on [employer] and meal planning. Cut out fast food and processed foods.

## 2023-06-27 ENCOUNTER — Other Ambulatory Visit: Payer: Self-pay | Admitting: Family Medicine

## 2023-06-27 ENCOUNTER — Ambulatory Visit
Admission: EM | Admit: 2023-06-27 | Discharge: 2023-06-27 | Disposition: A | Discharge status: Home / Self Care | Payer: 59 | Attending: Nurse Practitioner | Admitting: Nurse Practitioner

## 2023-06-27 ENCOUNTER — Encounter: Payer: Self-pay | Admitting: *Deleted

## 2023-06-27 ENCOUNTER — Encounter: Payer: Self-pay | Admitting: Family Medicine

## 2023-06-27 ENCOUNTER — Ambulatory Visit: Payer: Self-pay | Admitting: Family Medicine

## 2023-06-27 DIAGNOSIS — Z8709 Personal history of other diseases of the respiratory system: Secondary | ICD-10-CM

## 2023-06-27 DIAGNOSIS — J111 Influenza due to unidentified influenza virus with other respiratory manifestations: Secondary | ICD-10-CM

## 2023-06-27 DIAGNOSIS — E559 Vitamin D deficiency, unspecified: Secondary | ICD-10-CM

## 2023-06-27 LAB — POC COVID19/FLU A&B COMBO
Covid Antigen, POC: NEGATIVE
Influenza A Antigen, POC: NEGATIVE
Influenza B Antigen, POC: NEGATIVE

## 2023-06-27 MED ORDER — ALBUTEROL SULFATE HFA 108 (90 BASE) MCG/ACT IN AERS: 2.0000 | INHALATION_SPRAY | Freq: Four times a day (QID) | RESPIRATORY_TRACT | 0 refills | Status: DC | PRN

## 2023-06-27 MED ORDER — OSELTAMIVIR PHOSPHATE 75 MG PO CAPS: 75.0000 mg | ORAL_CAPSULE | Freq: Two times a day (BID) | ORAL | 0 refills | Status: DC

## 2023-06-27 MED ORDER — PROMETHAZINE-DM 6.25-15 MG/5ML PO SYRP: 5.0000 mL | ORAL_SOLUTION | Freq: Four times a day (QID) | ORAL | 0 refills | Status: DC | PRN

## 2023-06-27 MED ORDER — VITAMIN D (ERGOCALCIFEROL) 1.25 MG (50000 UNIT) PO CAPS: 50000.0000 [IU] | ORAL_CAPSULE | ORAL | 1 refills | Status: DC

## 2023-06-27 NOTE — ED Triage Notes (Signed)
 Pt states she started last night with cough, vomiting due to cough, SOB, chills, body aches and feels weak. She Took some night time meds OTC last night. Last dose of albuterol MDI was 10:30am

## 2023-06-27 NOTE — Telephone Encounter (Signed)
  Chief Complaint: body aches, sore throat, wheezing,  Symptoms: ST, body aches, cough, CP, mild SOB Frequency: started yesterday Pertinent Negatives: Patient denies covid/flu test at home  Disposition: [] ED /[x] Urgent Care (no appt availability in office) / [] Appointment(In office/virtual)/ []  [facility]/ [] Home Care/ [] Refused Recommended Disposition /[] [facility]/ []  Follow-up with PCP  Additional Notes: Pt states that she started feeling ill last evening. Pt states she has body aches, CP, mild SOB, potential fever, and sore throat. Pt states that she is struggling to eat/drink d/t sore throat. Pt had multiple episodes of coughing over the phone. Pt states that she has tried numerous OTC medications and none seem to be helping. Pt advised that she will be utilizing UC or the ED today.  Copied from CRM [id number]. Topic: Clinical - Red Word Triage >> Jun 27, 2023 12:53 PM Ivette P wrote: Red Word that prompted transfer to Nurse Triage: difficulty breathing. throat hurts, coughing throwing up, body aching. Possible fever.   Yesterday night, symptoms. Reason for Disposition  [1] MILD difficulty breathing (e.g., minimal/no SOB at rest, SOB with walking, pulse <100) AND [2] still present when not coughing  Answer Assessment - Initial Assessment Questions 1. ONSET: "When did the cough begin?"      Last night 2. SEVERITY: "How bad is the cough today?"      severe 3. SPUTUM: "Describe the color of your sputum" (none, dry cough; clear, white, yellow, green)     Blood tinged 4. HEMOPTYSIS: "Are you coughing up any blood?" If so ask: "How much?" (flecks, streaks, tablespoons, etc.)     streaks 5. DIFFICULTY BREATHING: "Are you having difficulty breathing?" If Yes, ask: "How bad is it?" (e.g., mild, moderate, severe)    - MILD: No SOB at rest, mild SOB with walking, speaks normally in sentences, can lie down, no retractions, pulse < 100.    - MODERATE: SOB at rest, SOB with  minimal exertion and prefers to sit, cannot lie down flat, speaks in phrases, mild retractions, audible wheezing, pulse 100-120.    - SEVERE: Very SOB at rest, speaks in single words, struggling to breathe, sitting hunched forward, retractions, pulse > 120      mild 6. FEVER: "Do you have a fever?" If Yes, ask: "What is your temperature, how was it measured, and when did it start?"     Doesn't have a thermometer, states forehead feels hot 7. CARDIAC HISTORY: "Do you have any history of heart disease?" (e.g., heart attack, congestive heart failure)      SVT,  8. LUNG HISTORY: "Do you have any history of lung disease?"  (e.g., pulmonary embolus, asthma, emphysema)     denies 9. PE RISK FACTORS: "Do you have a history of blood clots?" (or: recent major surgery, recent prolonged travel, bedridden)     denies 10. OTHER SYMPTOMS: "Do you have any other symptoms?" (e.g., runny nose, wheezing, chest pain)       Wheezing, CP,  11. PREGNANCY: "Is there any chance you are pregnant?" "When was your last menstrual period?"       denies 12. TRAVEL: "Have you traveled out of the country in the last month?" (e.g., travel history, exposures)       denies  Protocols used: Cough - Acute Productive-A-AH

## 2023-06-27 NOTE — ED Provider Notes (Addendum)
 [facility]    CSN: [id number] Arrival date & time: 06/27/23  1404      History   Chief Complaint Chief Complaint  Patient presents with   Shortness of Breath   Cough   Nasal Congestion   Generalized Body Aches   Emesis   Chills    HPI Jessica Sanchez is a 38 y.o. female.   The history is provided by the patient.   Patient presents for complaints of generalized bodyaches, chills, fatigue, persistent cough, shortness of breath, wheezing, and nausea.  Patient denies fever, headache, ear pain, difficulty breathing, chest pain, abdominal pain, vomiting, diarrhea, or rash.  Patient states that she did vomit once due to excessive coughing.  States that she has been taking over-the-counter cough and cold medications for symptoms with minimal relief.  Patient states that she was wheezing previously, states that she also used her albuterol inhaler.  Patient reports significant past medical history of seasonal allergies.  She denies any obvious known sick contacts.  Patient with underlying history of asthma and diabetes.  Last A1c was 5.9.  Past Medical History:  Diagnosis Date   Angio-edema    Anxiety    Arthritis    right shoulder   Asthma    Diabetes mellitus without complication (HCC)    Dyspnea    Dysrhythmia    hx of SVT   Fibroid (bleeding) (uterine)    Fibroid, uterine    H/O metrorrhagia    Hypertension    IUD (intrauterine device) in place 07/11/2015   Menorrhagia    Obesity, Class III, BMI 40-49.9 (morbid obesity) (HCC) 11/11/2018   Sleep apnea    uses CPAP - setting 11   SVT (supraventricular tachycardia) (HCC)     Patient Active Problem List   Diagnosis Date Noted   Fibroids 01/26/2023   S/P hysterectomy 01/19/2023   Prediabetes 01/12/2023   Intractable chronic migraine with aura and without status migrainosus 12/20/2022   Iron deficiency anemia due to chronic blood loss 12/20/2022   Essential hypertension 11/01/2022   DUB (dysfunctional uterine  bleeding) 11/01/2022   Not well controlled moderate persistent asthma 10/27/2022   Peripheral neuropathy 09/28/2022   Headache 07/01/2022   Screening for STD (sexually transmitted disease) 03/30/2022   Abdominal pain 03/02/2022   RUQ pain 02/23/2022   Abnormal abdominal CT scan 02/23/2022   Constipation 02/11/2022   Mild persistent asthma without complication 02/03/2022   Chronic urticaria 02/03/2022   Seasonal and perennial allergic rhinitis 02/03/2022   Anaphylactic shock due to adverse food reaction 02/03/2022   Need for immunization against influenza 01/27/2022   OSA (obstructive sleep apnea) 11/23/2021   Otitis media of left ear 10/26/2021   Chronic venous insufficiency of lower extremity 10/26/2021   Allergy 10/26/2021   Encounter for IUD removal    Menorrhagia 09/04/2020   Elevated troponin    Attempted IUD removal, unsuccessful 07/05/2019   Encounter for IUD insertion 07/05/2019   Gastroesophageal reflux disease    Nonspecific chest pain 11/11/2018   Morbid obesity (HCC) 11/11/2018   Elevated d-dimer 11/11/2018   SVT (supraventricular tachycardia) (HCC) 10/04/2018   IUD (intrauterine device) in place 07/11/2015   Other disorder of menstruation and other abnormal bleeding from female genital tract 10/30/2012    Past Surgical History:  Procedure Laterality Date   DILATATION AND CURETTAGE/HYSTEROSCOPY WITH MINERVA N/A 12/01/2021   Procedure: DILATATION AND CURETTAGE/HYSTEROSCOPY WITH MINERVA;  Surgeon: Lazaro Arms, MD;  Location: [facility];  Service: Gynecology;  Laterality: N/A;  HYSTERECTOMY ABDOMINAL WITH SALPINGECTOMY Bilateral 01/19/2023   Procedure: HYSTERECTOMY ABDOMINAL WITH SALPINGECTOMY;  Surgeon: Lazaro Arms, MD;  Location: [facility];  Service: Gynecology;  Laterality: Bilateral;   HYSTEROSCOPY N/A 09/17/2020   Procedure: HYSTEROSCOPY;  Surgeon: Lazaro Arms, MD;  Location: [facility];  Service: Gynecology;  Laterality: N/A;   HYSTEROSCOPY WITH D & C N/A  09/27/2013   Procedure: DILATATION AND CURETTAGE /HYSTEROSCOPY and insertion of Mirena IUD ;  Surgeon: Freddrick March. Tenny Craw, MD;  Location: [facility];  Service: Gynecology;  Laterality: N/A;   IUD REMOVAL N/A 09/17/2020   Procedure: INTRAUTERINE DEVICE (IUD) REMOVAL (2);  Surgeon: Lazaro Arms, MD;  Location: [facility];  Service: Gynecology;  Laterality: N/A;   LAPAROSCOPY N/A 01/19/2023   Procedure: LAPAROSCOPY DIAGNOSTIC;  Surgeon: Lazaro Arms, MD;  Location: [facility];  Service: Gynecology;  Laterality: N/A;   MYOMECTOMY  02/03/2011   Procedure: MYOMECTOMY;  Surgeon: Lazaro Arms, MD;  Location: [facility];  Service: Gynecology;  Laterality: N/A;   SVT ABLATION N/A 11/08/2018   Procedure: SVT ABLATION;  Surgeon: Marinus Maw, MD;  Location: [facility];  Service: Cardiovascular;  Laterality: N/A;   SVT ABLATION N/A 12/26/2020   Procedure: SVT ABLATION;  Surgeon: Marinus Maw, MD;  Location: [facility]AS[facility];  Service: Cardiovascular;  Laterality: N/A;   TONSILLECTOMY     TYMPANOSTOMY TUBE PLACEMENT      OB History     Gravida  1   Para      Term      Preterm      AB  1   Living         SAB  1   IAB      Ectopic      Multiple      Live Births               Home Medications    Prior to Admission medications   Medication Sig Start Date End Date Taking? Authorizing Provider  albuterol (VENTOLIN HFA) 108 (90 Base) MCG/ACT inhaler Inhale 2 puffs into the lungs every 6 (six) hours as needed. 06/27/23  Yes Leath-Warren, Sadie Haber, NP  amlodipine-olmesartan (AZOR) 10-20 MG tablet TAKE ONE TABLET BY MOUTH EVERY DAY 03/21/23  Yes Gilmore Laroche, FNP  Fluticasone-Umeclidin-Vilant (TRELEGY ELLIPTA) 200-62.5-25 MCG/ACT AEPB Inhale 1 puff into the lungs daily. 02/02/23  Yes Alfonse Spruce, MD  gabapentin (NEURONTIN) 300 MG capsule Take 1 capsule (300 mg total) by mouth at bedtime. 06/13/23  Yes Gilmore Laroche, FNP  levocetirizine (XYZAL) 5 MG tablet Take 1 tablet  (5 mg total) by mouth 2 (two) times daily as needed for allergies (hives/itching). 04/11/23  Yes Birder Robson, MD  metFORMIN (GLUCOPHAGE) 1000 MG tablet Take 0.5 tablets (500 mg total) by mouth 2 (two) times daily with a meal. 01/11/23  Yes Gilmore Laroche, FNP  montelukast (SINGULAIR) 10 MG tablet Take 1 tablet (10 mg total) by mouth at bedtime. 10/27/22  Yes Ambs, Norvel Richards, FNP  NON FORMULARY Pt uses a cpap nightly   Yes [provider]  oseltamivir (TAMIFLU) 75 MG capsule Take 1 capsule (75 mg total) by mouth every 12 (twelve) hours. 06/27/23  Yes Leath-Warren, Sadie Haber, NP  promethazine-dextromethorphan (PROMETHAZINE-DM) 6.25-15 MG/5ML syrup Take 5 mLs by mouth 4 (four) times daily as needed. 06/27/23  Yes Leath-Warren, Sadie Haber, NP  pyridOXINE (VITAMIN B6) 50 MG tablet Take 1 tablet (50 mg total) by mouth daily. 06/13/23  Yes Gilmore Laroche, FNP  Semaglutide-Weight Management (WEGOVY) 0.5 MG/0.5ML SOAJ Inject 0.5 mg into the skin once a week. 06/02/23  Yes Gilmore Laroche, FNP  Vitamin D, Ergocalciferol, (DRISDOL) 1.25 MG (50000 UNIT) CAPS capsule Take 1 capsule (50,000 Units total) by mouth every 7 (seven) days. 01/19/23  Yes Gilmore Laroche, FNP  Blood Glucose Monitoring Suppl DEVI 1 each by Does not apply route in the morning, at noon, and at bedtime. May substitute to any manufacturer covered by patient's insurance. 01/11/23   Gilmore Laroche, FNP  cyclobenzaprine (FLEXERIL) 10 MG tablet Take 1 tablet (10 mg total) by mouth every 8 (eight) hours as needed for muscle spasms. 02/03/23   Lazaro Arms, MD  EPINEPHrine (EPIPEN 2-PAK) 0.3 mg/0.3 mL IJ SOAJ injection Inject 0.3 mg into the muscle as needed for anaphylaxis. 02/16/23   Alfonse Spruce, MD  famotidine (PEPCID) 40 MG tablet Take 1 tablet (40 mg total) by mouth in the morning and at bedtime. 02/02/23   Alfonse Spruce, MD  ibuprofen (ADVIL) 600 MG tablet Take 1 tablet (600 mg total) by mouth every 6 (six) hours. 01/22/23    Lazaro Arms, MD  Multiple Vitamins-Calcium (ONE-A-DAY WOMENS PO) Take 1 tablet by mouth daily.    [provider]  SUMAtriptan (IMITREX) 100 MG tablet Take 1 tablet (100 mg total) by mouth every 2 (two) hours as needed for migraine. May repeat in 2 hours if headache persists or recurs. 12/20/22   Anabel Halon, MD  triamcinolone ointment (KENALOG) 0.1 % Apply twice daily for flare ups below neck, maximum 10 days. 04/11/23   Birder Robson, MD  apixaban (ELIQUIS) 5 MG TABS tablet Take 2 tablets (10mg ) twice daily for 7 days, then 1 tablet (5mg ) twice daily 04/20/19 04/20/19  Michela Pitcher A, PA-C  levonorgestrel (MIRENA) 20 MCG/24HR IUD 1 each by Intrauterine route once.   06/25/20  [provider]    Family History Family History  Problem Relation Age of Onset   Cirrhosis Mother    Diabetes Mother    Cancer Maternal Grandfather    Hypertension Sister    Diabetes Sister    Anesthesia problems Neg Hx    Hypotension Neg Hx    Malignant hyperthermia Neg Hx    Pseudochol deficiency Neg Hx     Social History Social History   Tobacco Use   Smoking status: Never   Smokeless tobacco: Never   Tobacco comments:    socially  Vaping Use   Vaping status: Never Used  Substance Use Topics   Alcohol use: Not Currently   Drug use: Not Currently    Types: Marijuana     Allergies   Shellfish allergy, Sulfa antibiotics, and Adhesive [tape]   Review of Systems Review of Systems Per HPI  Physical Exam Triage Vital Signs ED Triage Vitals  Encounter Vitals Group     BP 06/27/23 1515 120/74     Systolic BP Percentile --      Diastolic BP Percentile --      Pulse Rate 06/27/23 1515 99     Resp 06/27/23 1515 20     Temp 06/27/23 1515 98.2 F (36.8 C)     Temp Source 06/27/23 1515 Oral     SpO2 06/27/23 1515 98 %     Weight --      Height --      Head Circumference --      Peak Flow --      Pain Score  06/27/23 1511 10     Pain Loc --      Pain Education --       Exclude from Growth Chart --    No data found.  Updated Vital Signs BP 120/74 (BP Location: Right Arm)   Pulse 99   Temp 98.2 F (36.8 C) (Oral)   Resp 20   LMP 12/26/2022 (Approximate)   SpO2 98%   Visual Acuity Right Eye Distance:   Left Eye Distance:   Bilateral Distance:    Right Eye Near:   Left Eye Near:    Bilateral Near:     Physical Exam Vitals and nursing note reviewed.  Constitutional:      General: She is not in acute distress.    Appearance: She is well-developed.  HENT:     Head: Normocephalic.     Right Ear: Tympanic membrane, ear canal and external ear normal.     Left Ear: Tympanic membrane, ear canal and external ear normal.     Nose: Congestion present.     Right Turbinates: Enlarged and swollen.     Left Turbinates: Enlarged and swollen.     Right Sinus: No maxillary sinus tenderness or frontal sinus tenderness.     Left Sinus: No maxillary sinus tenderness or frontal sinus tenderness.     Mouth/Throat:     Lips: Pink.     Mouth: Mucous membranes are moist.     Pharynx: Oropharynx is clear. Uvula midline. Postnasal drip present. No pharyngeal swelling, oropharyngeal exudate, posterior oropharyngeal erythema or uvula swelling.     Tonsils: No tonsillar exudate.  Eyes:     Extraocular Movements: Extraocular movements intact.     Conjunctiva/sclera: Conjunctivae normal.     Pupils: Pupils are equal, round, and reactive to light.  Cardiovascular:     Rate and Rhythm: Normal rate and regular rhythm.     Pulses: Normal pulses.     Heart sounds: Normal heart sounds.  Pulmonary:     Effort: Pulmonary effort is normal. No respiratory distress.     Breath sounds: Normal breath sounds. No stridor. No wheezing, rhonchi or rales.  Abdominal:     General: Bowel sounds are normal.     Palpations: Abdomen is soft.     Tenderness: There is no abdominal tenderness.  Musculoskeletal:     Cervical back: Normal range of motion.  Lymphadenopathy:     Cervical:  No cervical adenopathy.  Skin:    General: Skin is warm and dry.  Neurological:     General: No focal deficit present.     Mental Status: She is alert and oriented to person, place, and time.  Psychiatric:        Mood and Affect: Mood normal.        Behavior: Behavior normal.      UC Treatments / Results  Labs (all labs ordered are listed, but only abnormal results are displayed) Labs Reviewed  POC COVID19/FLU A&B COMBO - Normal    EKG   Radiology No results found.  Procedures Procedures (including critical care time)  Medications Ordered in UC Medications - No data to display  Initial Impression / Assessment and Plan / UC Course  I have reviewed the triage vital signs and the nursing notes.  Pertinent labs & imaging results that were available during my care of the patient were reviewed by me and considered in my medical decision making (see chart for details).  COVID/flu test was negative; however, given patient's current  symptoms, will treat with Tamiflu 75 mg given her flulike symptoms.  Albuterol inhaler was also refilled, along with Promethazine DM for her cough.  Supportive care recommendations were provided and discussed with the patient to include fluids, rest, over-the-counter analgesics, and use of a humidifier and sleeping elevated while cough symptoms persist.  Discussed indications regarding follow-up.  Patient was in agreement with this plan of care and verbalizes understanding.  All questions were answered.  Patient stable for discharge.  Work note was provided.  Final Clinical Impressions(s) / UC Diagnoses   Final diagnoses:  Influenza-like illness  History of asthma     Discharge Instructions      The COVID/flu test was negative; however, given your current symptoms, I have decided to treat you with Tamiflu. Take medication as prescribed. Increase fluids and allow for plenty of rest. Recommend use of normal saline nasal spray throughout the day  for nasal congestion and runny nose. For your cough, you may use an albuterol inhaler and sleep elevated on pillows while cough symptoms persist. If you develop a fever, you should remain home until you have been fever free for 24 hours with no medication. As discussed, symptoms should improve over the next 7 to 10 days.  If symptoms fail to improve, or appear to be worsening, you may follow-up in this clinic or with your primary care physician for further evaluation. Follow-up as needed.     ED Prescriptions     Medication Sig Dispense Auth. Provider   oseltamivir (TAMIFLU) 75 MG capsule Take 1 capsule (75 mg total) by mouth every 12 (twelve) hours. 10 capsule Leath-Warren, Sadie Haber, NP   promethazine-dextromethorphan (PROMETHAZINE-DM) 6.25-15 MG/5ML syrup Take 5 mLs by mouth 4 (four) times daily as needed. 118 mL Leath-Warren, Sadie Haber, NP   albuterol (VENTOLIN HFA) 108 (90 Base) MCG/ACT inhaler Inhale 2 puffs into the lungs every 6 (six) hours as needed. 8 g Leath-Warren, Sadie Haber, NP      PDMP not reviewed this encounter.   Abran Cantor, NP 06/27/23 1547    Abran Cantor, NP 06/27/23 1551

## 2023-06-27 NOTE — Discharge Instructions (Addendum)
 The COVID/flu test was negative; however, given your current symptoms, I have decided to treat you with Tamiflu. Take medication as prescribed. Increase fluids and allow for plenty of rest. Recommend use of normal saline nasal spray throughout the day for nasal congestion and runny nose. For your cough, you may use an albuterol inhaler and sleep elevated on pillows while cough symptoms persist. If you develop a fever, you should remain home until you have been fever free for 24 hours with no medication. As discussed, symptoms should improve over the next 7 to 10 days.  If symptoms fail to improve, or appear to be worsening, you may follow-up in this clinic or with your primary care physician for further evaluation. Follow-up as needed.

## 2023-06-30 ENCOUNTER — Encounter (HOSPITAL_COMMUNITY): Payer: Self-pay

## 2023-06-30 ENCOUNTER — Emergency Department (HOSPITAL_COMMUNITY)
Admission: EM | Admit: 2023-06-30 | Discharge: 2023-06-30 | Disposition: A | Discharge status: Home / Self Care | Payer: 59

## 2023-06-30 ENCOUNTER — Other Ambulatory Visit: Payer: Self-pay

## 2023-06-30 ENCOUNTER — Emergency Department (HOSPITAL_COMMUNITY): Payer: 59

## 2023-06-30 DIAGNOSIS — Z7951 Long term (current) use of inhaled steroids: Secondary | ICD-10-CM | POA: Insufficient documentation

## 2023-06-30 DIAGNOSIS — R079 Chest pain, unspecified: Secondary | ICD-10-CM

## 2023-06-30 DIAGNOSIS — Z7901 Long term (current) use of anticoagulants: Secondary | ICD-10-CM | POA: Diagnosis not present

## 2023-06-30 DIAGNOSIS — J101 Influenza due to other identified influenza virus with other respiratory manifestations: Secondary | ICD-10-CM | POA: Insufficient documentation

## 2023-06-30 DIAGNOSIS — Z794 Long term (current) use of insulin: Secondary | ICD-10-CM | POA: Insufficient documentation

## 2023-06-30 DIAGNOSIS — R0602 Shortness of breath: Secondary | ICD-10-CM | POA: Diagnosis present

## 2023-06-30 DIAGNOSIS — R531 Weakness: Secondary | ICD-10-CM | POA: Insufficient documentation

## 2023-06-30 DIAGNOSIS — R0789 Other chest pain: Secondary | ICD-10-CM | POA: Diagnosis not present

## 2023-06-30 DIAGNOSIS — J45909 Unspecified asthma, uncomplicated: Secondary | ICD-10-CM | POA: Insufficient documentation

## 2023-06-30 LAB — CBC
HCT: 37.4 % (ref 36.0–46.0)
Hemoglobin: 11.7 g/dL — ABNORMAL LOW (ref 12.0–15.0)
MCH: 26.2 pg (ref 26.0–34.0)
MCHC: 31.3 g/dL (ref 30.0–36.0)
MCV: 83.9 fL (ref 80.0–100.0)
Platelets: 251 10*3/uL (ref 150–400)
RBC: 4.46 MIL/uL (ref 3.87–5.11)
RDW: 15.5 % (ref 11.5–15.5)
WBC: 3.3 10*3/uL — ABNORMAL LOW (ref 4.0–10.5)
nRBC: 0 % (ref 0.0–0.2)

## 2023-06-30 LAB — BASIC METABOLIC PANEL
Anion gap: 11 (ref 5–15)
BUN: 10 mg/dL (ref 6–20)
CO2: 25 mmol/L (ref 22–32)
Calcium: 8.6 mg/dL — ABNORMAL LOW (ref 8.9–10.3)
Chloride: 99 mmol/L (ref 98–111)
Creatinine, Ser: 0.74 mg/dL (ref 0.44–1.00)
GFR, Estimated: >60 mL/min (ref >60)
Glucose, Bld: 86 mg/dL (ref 70–99)
Potassium: 3.6 mmol/L (ref 3.5–5.1)
Sodium: 135 mmol/L (ref 135–145)

## 2023-06-30 LAB — RESP PANEL BY RT-PCR (RSV, FLU A&B, COVID)  RVPGX2
Influenza A by PCR: POSITIVE — AB
Influenza B by PCR: NEGATIVE
Resp Syncytial Virus by PCR: NEGATIVE
SARS Coronavirus 2 by RT PCR: NEGATIVE

## 2023-06-30 LAB — TROPONIN I (HIGH SENSITIVITY): Troponin I (High Sensitivity): <2 ng/L (ref <18)

## 2023-06-30 MED ORDER — POTASSIUM CHLORIDE CRYS ER 20 MEQ PO TBCR
40.0000 meq | EXTENDED_RELEASE_TABLET | Freq: Once | ORAL | Status: AC
  Administered 2023-06-30: 40 meq via ORAL
  Filled 2023-06-30: qty 2

## 2023-06-30 MED ORDER — ALUM & MAG HYDROXIDE-SIMETH 200-200-20 MG/5ML PO SUSP
30.0000 mL | Freq: Once | ORAL | Status: AC
Start: 2023-06-30 — End: 2023-06-30
  Administered 2023-06-30: 30 mL via ORAL
  Filled 2023-06-30: qty 30

## 2023-06-30 MED ORDER — ACETAMINOPHEN 500 MG PO TABS
1000.0000 mg | ORAL_TABLET | Freq: Once | ORAL | Status: AC
  Administered 2023-06-30: 1000 mg via ORAL
  Filled 2023-06-30: qty 2

## 2023-06-30 NOTE — ED Notes (Signed)
 EKG strip from portable EKG monitor given to MD

## 2023-06-30 NOTE — Discharge Instructions (Signed)
 You are seen today for chest pain and generalized weakness, your workup was overall very reassuring, your symptoms are likely due to the flu.  Your chest x-ray did not show pneumonia, your heart is borderline enlarged so you should follow-up with your primary care doctor for further evaluation though no need for emergent evaluation today.  Drink plenty of fluids, rest, come back for any new or worsening symptoms.

## 2023-06-30 NOTE — ED Triage Notes (Signed)
 Pt states that she went to [facility] Monday due to a cold she had and was negative for covid and flu. States lastnight and this morning she has been having CP mid to L side  that pt describes it as "pressure", endorses dizziness, SHOB, and does have a cough

## 2023-06-30 NOTE — ED Provider Notes (Signed)
 [facility] AT [facility] Provider Note   CSN: [id number] Arrival date & time: 06/30/23  1502     History  Chief Complaint  Patient presents with   Chest Pain    Jessica Sanchez is a 38 y.o. female.  She has past medical history of prediabetes, asthma, sleep apnea, SVT.  Presents the ER today for evaluation of generalized weakness and chest pain.  States started last night and gradually worsened throughout the day today where she had to leave work.  She tried ibuprofen without relief.  Pain is nonradiating, associated with some mild shortness of breath, she states when she tries to walk she feels very weak.  Denies worsening of the chest pain with walking.  Denies diaphoresis.  No nausea or vomiting.  No dizziness or syncope.   Chest Pain      Home Medications Prior to Admission medications   Medication Sig Start Date End Date Taking? Authorizing Provider  albuterol (VENTOLIN HFA) 108 (90 Base) MCG/ACT inhaler Inhale 2 puffs into the lungs every 6 (six) hours as needed. 06/27/23   Leath-Warren, Sadie Haber, NP  amlodipine-olmesartan (AZOR) 10-20 MG tablet TAKE ONE TABLET BY MOUTH EVERY DAY 03/21/23   Gilmore Laroche, FNP  Blood Glucose Monitoring Suppl DEVI 1 each by Does not apply route in the morning, at noon, and at bedtime. May substitute to any manufacturer covered by patient's insurance. 01/11/23   Gilmore Laroche, FNP  cyclobenzaprine (FLEXERIL) 10 MG tablet Take 1 tablet (10 mg total) by mouth every 8 (eight) hours as needed for muscle spasms. 02/03/23   Lazaro Arms, MD  EPINEPHrine (EPIPEN 2-PAK) 0.3 mg/0.3 mL IJ SOAJ injection Inject 0.3 mg into the muscle as needed for anaphylaxis. 02/16/23   Alfonse Spruce, MD  famotidine (PEPCID) 40 MG tablet Take 1 tablet (40 mg total) by mouth in the morning and at bedtime. 02/02/23   Alfonse Spruce, MD  Fluticasone-Umeclidin-Vilant (TRELEGY ELLIPTA) 200-62.5-25 MCG/ACT AEPB Inhale 1 puff into  the lungs daily. 02/02/23   Alfonse Spruce, MD  gabapentin (NEURONTIN) 300 MG capsule Take 1 capsule (300 mg total) by mouth at bedtime. 06/13/23   Gilmore Laroche, FNP  ibuprofen (ADVIL) 600 MG tablet Take 1 tablet (600 mg total) by mouth every 6 (six) hours. 01/22/23   Lazaro Arms, MD  levocetirizine (XYZAL) 5 MG tablet Take 1 tablet (5 mg total) by mouth 2 (two) times daily as needed for allergies (hives/itching). 04/11/23   Birder Robson, MD  metFORMIN (GLUCOPHAGE) 1000 MG tablet Take 0.5 tablets (500 mg total) by mouth 2 (two) times daily with a meal. 01/11/23   Gilmore Laroche, FNP  montelukast (SINGULAIR) 10 MG tablet Take 1 tablet (10 mg total) by mouth at bedtime. 10/27/22   Hetty Blend, FNP  Multiple Vitamins-Calcium (ONE-A-DAY WOMENS PO) Take 1 tablet by mouth daily.    [provider]  NON FORMULARY Pt uses a cpap nightly    [provider]  oseltamivir (TAMIFLU) 75 MG capsule Take 1 capsule (75 mg total) by mouth every 12 (twelve) hours. 06/27/23   Leath-Warren, Sadie Haber, NP  promethazine-dextromethorphan (PROMETHAZINE-DM) 6.25-15 MG/5ML syrup Take 5 mLs by mouth 4 (four) times daily as needed. 06/27/23   Leath-Warren, Sadie Haber, NP  pyridOXINE (VITAMIN B6) 50 MG tablet Take 1 tablet (50 mg total) by mouth daily. 06/13/23   Gilmore Laroche, FNP  Semaglutide-Weight Management (WEGOVY) 0.5 MG/0.5ML SOAJ Inject 0.5 mg into the skin once  a week. 06/02/23   Gilmore Laroche, FNP  SUMAtriptan (IMITREX) 100 MG tablet Take 1 tablet (100 mg total) by mouth every 2 (two) hours as needed for migraine. May repeat in 2 hours if headache persists or recurs. 12/20/22   Anabel Halon, MD  triamcinolone ointment (KENALOG) 0.1 % Apply twice daily for flare ups below neck, maximum 10 days. 04/11/23   Birder Robson, MD  Vitamin D, Ergocalciferol, (DRISDOL) 1.25 MG (50000 UNIT) CAPS capsule Take 1 capsule (50,000 Units total) by mouth every 7 (seven) days. 06/27/23   Gilmore Laroche, FNP   apixaban (ELIQUIS) 5 MG TABS tablet Take 2 tablets (10mg ) twice daily for 7 days, then 1 tablet (5mg ) twice daily 04/20/19 04/20/19  Michela Pitcher A, PA-C  levonorgestrel (MIRENA) 20 MCG/24HR IUD 1 each by Intrauterine route once.   06/25/20  [provider]      Allergies    Shellfish allergy, Sulfa antibiotics, and Adhesive [tape]    Review of Systems   Review of Systems  Cardiovascular:  Positive for chest pain.    Physical Exam Updated Vital Signs BP 114/60 (BP Location: Right Arm)   Pulse 75   Temp 97.9 F (36.6 C) (Oral)   Resp 18   Ht 5\' 1"  (1.549 m)   Wt (!) 160.1 kg   LMP 12/26/2022 (Approximate)   SpO2 97%   BMI 66.70 kg/m  Physical Exam Vitals and nursing note reviewed.  Constitutional:      General: She is not in acute distress.    Appearance: She is well-developed.  HENT:     Head: Normocephalic and atraumatic.  Eyes:     Conjunctiva/sclera: Conjunctivae normal.  Cardiovascular:     Rate and Rhythm: Normal rate and regular rhythm.     Heart sounds: No murmur heard. Pulmonary:     Effort: Pulmonary effort is normal. No respiratory distress.     Breath sounds: Normal breath sounds.  Chest:     Chest wall: No tenderness or crepitus.  Abdominal:     Palpations: Abdomen is soft.     Tenderness: There is no abdominal tenderness.  Musculoskeletal:        General: No swelling. Normal range of motion.     Cervical back: Neck supple.     Right lower leg: No tenderness. No edema.     Left lower leg: No tenderness. No edema.  Skin:    General: Skin is warm and dry.     Capillary Refill: Capillary refill takes less than 2 seconds.  Neurological:     General: No focal deficit present.     Mental Status: She is alert and oriented to person, place, and time.  Psychiatric:        Mood and Affect: Mood normal.     ED Results / Procedures / Treatments   Labs (all labs ordered are listed, but only abnormal results are displayed) Labs Reviewed  RESP  PANEL BY RT-PCR (RSV, FLU A&B, COVID)  [id number]  BASIC METABOLIC PANEL  CBC  TROPONIN I (HIGH SENSITIVITY)    EKG None  Radiology No results found.  Procedures Procedures    Medications Ordered in ED Medications - No data to display  ED Course/ Medical Decision Making/ A&P                                 Medical Decision Making The patient presented today for chest pain  that had started last night and has been constant. EKG showed anterior T wave inversions similar to EKG on May 02, 2023, Chest Xray was independently reviewed by me and shows no pulmonary edema or infiltrate, borderline cardiomegaly. I agree with radiology interpretation.   They were given Tylenol and GI cocktail for their symptoms, and on repeat evaluation their symptoms are removed  I considered a broad differential including but not limited to ACS, PE, Dissection, pneumothorax, costochondritis, pneumonia, GERD, pericarditis, and pericardial effusion.  Is not consistent with PE, patient not hypoxic or tachycardic, not having pleuritic pain, no indication for further evaluation  I feel disssection is really unlikely  Symptoms and EKG are not consistent with pericarditis  Their heart score is low risk.  It is negative in setting of constipation since last night  Flu swab positive, patient already on Tamiflu  Plan is for charge home with supportive care, discomfort and generalized weakness likely due to influenza, chest pain possibly due to coughing, advised on follow-up and strict return precautions.   Amount and/or Complexity of Data Reviewed Labs: ordered. Radiology: ordered.  Risk OTC drugs.           Final Clinical Impression(s) / ED Diagnoses Final diagnoses:  None    Rx / DC Orders ED Discharge Orders     None         Ma Rings, PA-C 06/30/23 1848    Coral Spikes, DO 06/30/23 2350

## 2023-06-30 NOTE — ED Provider Triage Note (Signed)
 Emergency Medicine Provider Triage Evaluation Note  Jessica Sanchez , a 38 y.o. female  was evaluated in triage.  Pt complains of chest pain that started last night, gradually worsened today with generalized fatigue and weakness, no cough.  Mild shortness of breath.  No diaphoresis.  Pain is not radiating.  Review of Systems  Positive: Chest pain Negative: N/V, diaphoresis  Physical Exam  BP 114/60 (BP Location: Right Arm)   Pulse 75   Temp 97.9 F (36.6 C) (Oral)   Resp 18   Ht 5\' 1"  (1.549 m)   Wt (!) 160.1 kg   LMP 12/26/2022 (Approximate)   SpO2 97%   BMI 66.70 kg/m  Gen:   Awake, no distress   Resp:  Normal effort  MSK:   Moves extremities without difficulty  Other:    Medical Decision Making  Medically screening exam initiated at 4:04 PM.  Appropriate orders placed.  ADRIEANNA BOTELER was informed that the remainder of the evaluation will be completed by another provider, this initial triage assessment does not replace that evaluation, and the importance of remaining in the ED until their evaluation is complete.     Ma Rings, [place] 06/30/23 1606

## 2023-07-12 ENCOUNTER — Ambulatory Visit: Payer: 59 | Admitting: Nutrition

## 2023-07-14 DIAGNOSIS — G4733 Obstructive sleep apnea (adult) (pediatric): Secondary | ICD-10-CM | POA: Diagnosis not present

## 2023-07-21 NOTE — Progress Notes (Signed)
 [address] [place] [address] Dept: [phone]  FOLLOW UP NOTE  Patient ID: Jessica Sanchez, female    DOB: 09/02/1985  Age: 38 y.o. MRN: [id number] Date of Office Visit: 07/22/2023  Assessment  Chief Complaint: Follow-up (Had a reaction to peanut butter- mouth itching, shallow breathing ), Asthma, Pruritus, and Urticaria  HPI Jessica Sanchez is a 38 year old female who presents to the clinic for follow-up visit.  She was last seen in this clinic on 04/11/2023 by Dr. Allena Katz for evaluation of asthma, allergic rhinitis, chronic urticaria, and rash.  In the interim, she visited the emergency department on 06/27/2023 for evaluation of symptoms including fever, headache, ear pain, difficulty breathing, chest pain, abdominal pain, vomiting, diarrhea, and rash.  COVID and flu testing were negative at that time, however, patient was started on Tamiflu due to symptoms.  Patient presented to emergency department again on 06/30/2023 with nasal swab positive to influenza A.  At today's visit, she reports her asthma has been moderately well-controlled with some symptoms including shortness of breath especially with activity, intermittent wheeze, and cough producing mucus.  She continues Trelegy 200-1 puff once a day and uses Xopenex about 2 times a month with relief of symptoms.  She reports that she is not currently taking montelukast as her insurance would not cover this medicine.    Allergic rhinitis is reported as moderately well controlled with symptoms including clear rhinorrhea, occasional nasal congestion, and post nasal drainage. She continues levocetirizine 5 mg twice a day and is not currently using nasal steroid spray or nasal saline rinses. Her last environmental allergy skin testing was 12/2021 and was positive to grass pollen, weed pollen, ragweed pollen, tree pollen, indoor mold, outdoor mold, dust mite, cat, cockroach, and tobacco.  She is interested in allergen immunotherapy in  the near future. Written information provided. She will call the clinic for an appointment for an immunotherapy start if interested in allergen immunotherapy.   Conjunctivitis is reported as moderately well-controlled with occasional symptoms including red and itchy eyes as well as clear stringy drainage occurring in the morning.  She is not currently using any medical intervention for allergic conjunctivitis at this time.  Urticaria is reported as moderately well-controlled with occasional hives.  She reports frequent episodes of pruritus.  She denies new personal care products, foods, new medicines, or insect stings.  She continues levocetirizine with moderate relief of symptoms.  She has previously received Xolair injections with moderate relief of symptoms  She continues to avoid shellfish with no accidental ingestion or EpiPen use since her last visit to this clinic.  She does report feeling itching and scratching in her throat after eating peanut butter for which she took Benadryl with relief of symptoms.  She is interested in testing the most allergenic food allergies at this time.  EpiPen set is up-to-date.  Her current medications are listed in the chart.  Drug Allergies:  Allergies  Allergen Reactions   Shellfish Allergy Shortness Of Breath, Itching and Swelling    Mouth became swollen, throat started itching, and developed shortness of breath   Sulfa Antibiotics Itching, Shortness Of Breath and Swelling    Mouth became swollen, throat started itching, and developed shortness of breath   Adhesive [Tape] Itching    Depends on the type of medical tape    Physical Exam: BP 108/72   Pulse 87   Temp 97.9 F (36.6 C)   Resp 16   Wt (!) 347 lb 8  oz (157.6 kg)   LMP 12/26/2022 (Approximate)   SpO2 97%   BMI 65.66 kg/m    Physical Exam Vitals reviewed.  Constitutional:      Appearance: Normal appearance.  HENT:     Head: Normocephalic and atraumatic.     Right Ear: Tympanic  membrane normal.     Left Ear: Tympanic membrane normal.     Nose:     Comments: Bilateral naris edematous and pale with thin clear nasal drainage noted.  Pharynx normal.  Ears normal.  Eyes normal.    Mouth/Throat:     Pharynx: Oropharynx is clear.  Eyes:     Conjunctiva/sclera: Conjunctivae normal.  Cardiovascular:     Rate and Rhythm: Normal rate and regular rhythm.     Heart sounds: Normal heart sounds. No murmur heard. Pulmonary:     Effort: Pulmonary effort is normal.     Breath sounds: Normal breath sounds.     Comments: Lungs clear to auscultation Musculoskeletal:        General: Normal range of motion.     Cervical back: Normal range of motion and neck supple.  Skin:    General: Skin is warm.  Neurological:     Mental Status: She is alert and oriented to person, place, and time.  Psychiatric:        Mood and Affect: Mood normal.        Behavior: Behavior normal.        Thought Content: Thought content normal.        Judgment: Judgment normal.     Diagnostics: FVC 2.09 which is 73% of predicted value, FEV1 1.70 which is 71% predicted value.  Spirometry indicates possible restriction.  Assessment and Plan: 1. Moderate persistent asthma without complication   2. Seasonal allergic conjunctivitis   3. Seasonal and perennial allergic rhinitis   4. Chronic urticaria   5. Anaphylactic shock due to food, subsequent encounter     Meds ordered this encounter  Medications   Fluticasone-Umeclidin-Vilant (TRELEGY ELLIPTA) 200-62.5-25 MCG/ACT AEPB    Sig: Inhale 1 puff into the lungs daily.    Dispense:  28 each    Refill:  5    Patient has a copay card.   montelukast (SINGULAIR) 10 MG tablet    Sig: Take 1 tablet (10 mg total) by mouth at bedtime.    Dispense:  30 tablet    Refill:  2   levocetirizine (XYZAL) 5 MG tablet    Sig: Take 1 tablet (5 mg total) by mouth 2 (two) times daily as needed for allergies (hives/itching).    Dispense:  60 tablet    Refill:  5    cromolyn (OPTICROM) 4 % ophthalmic solution    Sig: Place 2 drops into both eyes 4 (four) times daily as needed.    Dispense:  10 mL    Refill:  5    Patient Instructions  .  Asthma Continue Trelegy 200-1 puff once a day to prevent cough or wheeze. Continue montelukast 10 mg once a day to prevent cough or wheeze. Patient cautioned that rarely some children/adults can experience behavioral changes after beginning montelukast. These side effects are rare, however, if you notice any change, notify the clinic and discontinue montelukast.  Continue Xopenex 2 puffs once every 4-6 hours as needed for cough or wheeze.  This will replace albuterol You may use Xopenex 2 puffs 5 to 15 minutes before activity to decrease cough or wheeze.  This will replace albuterol  Allergic rhinitis Continue allergen avoidance measures directed toward grass pollen, weed pollen, ragweed pollen, tree pollen, mold, dust mite, cat, dog, cockroach, and tobacco as listed below Continue levocetirizine 5 mg once a day as needed for runny nose or itch Continue montelukast 10 mg once a day as listed above to help reduce allergy symptoms Continue Flonase 2 sprays in each nostril once a day as needed for stuffy nose Consider saline nasal rinses as needed for nasal symptoms. Use this before any medicated nasal sprays for best result Consider allergen immunotherapy if your symptoms are not well controlled with the treatment plan as listed below.  Written information provided at today's visit  Allergic conjunctivitis Begin cromolyn 1-2 drops in each eye 1-4 times a day if needed for red or itchy eyes  Hives (urticaria) Use the fewest amount of medications while remaining hive free Levocetirizine (Xyzal) 5 mg twice a day and famotidine (Pepcid) 20 mg twice a day. If no symptoms for 7-14 days then decrease to. Levocetirizine (Xyzal) 5 mg twice a day and famotidine (Pepcid) 20 mg once a day.  If no symptoms for 7-14 days then  decrease to. Levocetirizine (Xyzal) 5 mg twice a day.  If no symptoms for 7-14 days then decrease to. Levocetirizine (Xyzal) 5 mg once a day.  May use Benadryl (diphenhydramine) as needed for breakthrough hives       If symptoms return, then step up dosage Keep a detailed symptom journal including foods eaten, contact with allergens, medications taken, weather changes.   Food allergy Continue to avoid peanut and shellfish. In case of an allergic reaction, take Benadryl 50 mg capsules every 6 hours, and if life-threatening symptoms occur, inject with EpiPen 0.3 mg. An order has been placed to help [place] evaluate your food allergies. We will call you when the results are available  Call the clinic if this treatment plan is not working well for you.  Follow up in 3 months or sooner if needed.  Reducing Pollen Exposure The American Academy of Allergy, Asthma and Immunology suggests the following steps to reduce your exposure to pollen during allergy seasons. Do not hang sheets or clothing out to dry; pollen may collect on these items. Do not mow lawns or spend time around freshly cut grass; mowing stirs up pollen. Keep windows closed at night.  Keep car windows closed while driving. Minimize morning activities outdoors, a time when pollen counts are usually at their highest. Stay indoors as much as possible when pollen counts or humidity is high and on windy days when pollen tends to remain in the air longer. Use air conditioning when possible.  Many air conditioners have filters that trap the pollen spores. Use a HEPA room air filter to remove pollen form the indoor air you breathe.   Control of Mold Allergen Mold and fungi can grow on a variety of surfaces provided certain temperature and moisture conditions exist.  Outdoor molds grow on plants, decaying vegetation and soil.  The major outdoor mold, Alternaria and Cladosporium, are found in very high numbers during hot and dry conditions.   Generally, a late Summer - Fall peak is seen for common outdoor fungal spores.  Rain will temporarily lower outdoor mold spore count, but counts rise rapidly when the rainy period ends.  The most important indoor molds are Aspergillus and Penicillium.  Dark, humid and poorly ventilated basements are ideal sites for mold growth.  The next most common sites of mold growth are the bathroom and the  kitchen.  Outdoor [employer] Use air conditioning and keep windows closed Avoid exposure to decaying vegetation. Avoid leaf raking. Avoid grain handling. Consider wearing a face mask if working in moldy areas.  Indoor Mold Control Maintain humidity below 50%. Clean washable surfaces with 5% bleach solution. Remove sources e.g. Contaminated carpets.   Control of Dog or Cat Allergen Avoidance is the best way to manage a dog or cat allergy. If you have a dog or cat and are allergic to dog or cats, consider removing the dog or cat from the home. If you have a dog or cat but don't want to find it a new home, or if your family wants a pet even though someone in the household is allergic, here are some strategies that may help keep symptoms at bay:  Keep the pet out of your bedroom and restrict it to only a few rooms. Be advised that keeping the dog or cat in only one room will not limit the allergens to that room. Don't pet, hug or kiss the dog or cat; if you do, wash your hands with soap and water. High-efficiency particulate air (HEPA) cleaners run continuously in a bedroom or living room can reduce allergen levels over time. Regular use of a high-efficiency vacuum cleaner or a central vacuum can reduce allergen levels. Giving your dog or cat a bath at least once a week can reduce airborne allergen.   Control of Dust Mite Allergen Dust mites play a major role in allergic asthma and rhinitis. They occur in environments with high humidity wherever human skin is found. Dust mites absorb humidity from the  atmosphere (ie, they do not drink) and feed on organic matter (including shed human and animal skin). Dust mites are a microscopic type of insect that you cannot see with the naked eye. High levels of dust mites have been detected from mattresses, pillows, carpets, upholstered furniture, bed covers, clothes, soft toys and any woven material. The principal allergen of the dust mite is found in its feces. A gram of dust may contain 1,000 mites and 250,000 fecal particles. Mite antigen is easily measured in the air during house cleaning activities. Dust mites do not bite and do not cause harm to humans, other than by triggering allergies/asthma.  Ways to decrease your exposure to dust mites in your home:  1. Encase mattresses, box springs and pillows with a mite-impermeable barrier or cover  2. Wash sheets, blankets and drapes weekly in hot water (130 F) with detergent and dry them in a dryer on the hot setting.  3. Have the room cleaned frequently with a vacuum cleaner and a damp dust-mop. For carpeting or rugs, vacuuming with a vacuum cleaner equipped with a high-efficiency particulate air (HEPA) filter. The dust mite allergic individual should not be in a room which is being cleaned and should wait 1 hour after cleaning before going into the room.  4. Do not sleep on upholstered furniture (eg, couches).  5. If possible removing carpeting, upholstered furniture and drapery from the home is ideal. Horizontal blinds should be eliminated in the rooms where the person spends the most time (bedroom, study, television room). Washable vinyl, roller-type shades are optimal.  6. Remove all non-washable stuffed toys from the bedroom. Wash stuffed toys weekly like sheets and blankets above.  7. Reduce indoor humidity to less than 50%. Inexpensive humidity monitors can be purchased at most hardware stores. Do not use a humidifier as can make the problem worse and are  not recommended.  Control of Cockroach  Allergen Cockroach allergen has been identified as an important cause of acute attacks of asthma, especially in urban settings.  There are fifty-five species of cockroach that exist in the [place], however only three, the [place], [place] species produce allergen that can affect patients with Asthma.  Allergens can be obtained from fecal particles, egg casings and secretions from cockroaches.    Remove food sources. Reduce access to water. Seal access and entry points. Spray runways with 0.5-1% Diazinon or Chlorpyrifos Blow boric acid power under stoves and refrigerator. Place bait stations (hydramethylnon) at feeding sites.     No follow-ups on file.    Thank you for the opportunity to care for this patient.  Please do not hesitate to contact me with questions.  Thermon Leyland, FNP [facility]

## 2023-07-21 NOTE — Patient Instructions (Incomplete)
 Asthma Continue Trelegy 200-1 puff once a day to prevent cough or wheeze. Continue montelukast 10 mg once a day to prevent cough or wheeze. Patient cautioned that rarely some children/adults can experience behavioral changes after beginning montelukast. These side effects are rare, however, if you notice any change, notify the clinic and discontinue montelukast.  Continue Xopenex 2 puffs once every 4-6 hours as needed for cough or wheeze.  This will replace albuterol You may use Xopenex 2 puffs 5 to 15 minutes before activity to decrease cough or wheeze.  This will replace albuterol  Allergic rhinitis Continue allergen avoidance measures directed toward grass pollen, weed pollen, ragweed pollen, tree pollen, mold, dust mite, cat, dog, cockroach, and tobacco as listed below Continue levocetirizine 5 mg once a day as needed for runny nose or itch Continue montelukast 10 mg once a day as listed above to help reduce allergy symptoms Continue Flonase 2 sprays in each nostril once a day as needed for stuffy nose Consider saline nasal rinses as needed for nasal symptoms. Use this before any medicated nasal sprays for best result Consider allergen immunotherapy if your symptoms are not well controlled with the treatment plan as listed below.  Written information provided at today's visit  Hives (urticaria) Use the fewest amount of medications while remaining hive free Levocetirizine (Xyzal) 5 mg twice a day and famotidine (Pepcid) 20 mg twice a day. If no symptoms for 7-14 days then decrease to. Levocetirizine (Xyzal) 5 mg twice a day and famotidine (Pepcid) 20 mg once a day.  If no symptoms for 7-14 days then decrease to. Levocetirizine (Xyzal) 5 mg twice a day.  If no symptoms for 7-14 days then decrease to. Levocetirizine (Xyzal) 5 mg once a day.  May use Benadryl (diphenhydramine) as needed for breakthrough hives       If symptoms return, then step up dosage Keep a detailed symptom journal  including foods eaten, contact with allergens, medications taken, weather changes.   Food allergy Continue to avoid shellfish. In case of an allergic reaction, take Benadryl 50 mg capsules every 6 hours, and if life-threatening symptoms occur, inject with EpiPen 0.3 mg.  Call the clinic if this treatment plan is not working well for you.  Follow up in 3 months or sooner if needed.  Reducing Pollen Exposure The American Academy of Allergy, Asthma and Immunology suggests the following steps to reduce your exposure to pollen during allergy seasons. Do not hang sheets or clothing out to dry; pollen may collect on these items. Do not mow lawns or spend time around freshly cut grass; mowing stirs up pollen. Keep windows closed at night.  Keep car windows closed while driving. Minimize morning activities outdoors, a time when pollen counts are usually at their highest. Stay indoors as much as possible when pollen counts or humidity is high and on windy days when pollen tends to remain in the air longer. Use air conditioning when possible.  Many air conditioners have filters that trap the pollen spores. Use a HEPA room air filter to remove pollen form the indoor air you breathe.   Control of Mold Allergen Mold and fungi can grow on a variety of surfaces provided certain temperature and moisture conditions exist.  Outdoor molds grow on plants, decaying vegetation and soil.  The major outdoor mold, Alternaria and Cladosporium, are found in very high numbers during hot and dry conditions.  Generally, a late Summer - Fall peak is seen for common outdoor fungal spores.  Rain  will temporarily lower outdoor mold spore count, but counts rise rapidly when the rainy period ends.  The most important indoor molds are Aspergillus and Penicillium.  Dark, humid and poorly ventilated basements are ideal sites for mold growth.  The next most common sites of mold growth are the bathroom and the kitchen.  Outdoor [employer] Use air conditioning and keep windows closed Avoid exposure to decaying vegetation. Avoid leaf raking. Avoid grain handling. Consider wearing a face mask if working in moldy areas.  Indoor Mold Control Maintain humidity below 50%. Clean washable surfaces with 5% bleach solution. Remove sources e.g. Contaminated carpets.   Control of Dog or Cat Allergen Avoidance is the best way to manage a dog or cat allergy. If you have a dog or cat and are allergic to dog or cats, consider removing the dog or cat from the home. If you have a dog or cat but don't want to find it a new home, or if your family wants a pet even though someone in the household is allergic, here are some strategies that may help keep symptoms at bay:  Keep the pet out of your bedroom and restrict it to only a few rooms. Be advised that keeping the dog or cat in only one room will not limit the allergens to that room. Don't pet, hug or kiss the dog or cat; if you do, wash your hands with soap and water. High-efficiency particulate air (HEPA) cleaners run continuously in a bedroom or living room can reduce allergen levels over time. Regular use of a high-efficiency vacuum cleaner or a central vacuum can reduce allergen levels. Giving your dog or cat a bath at least once a week can reduce airborne allergen.   Control of Dust Mite Allergen Dust mites play a major role in allergic asthma and rhinitis. They occur in environments with high humidity wherever human skin is found. Dust mites absorb humidity from the atmosphere (ie, they do not drink) and feed on organic matter (including shed human and animal skin). Dust mites are a microscopic type of insect that you cannot see with the naked eye. High levels of dust mites have been detected from mattresses, pillows, carpets, upholstered furniture, bed covers, clothes, soft toys and any woven material. The principal allergen of the dust mite is found in its feces. A gram of dust  may contain 1,000 mites and 250,000 fecal particles. Mite antigen is easily measured in the air during house cleaning activities. Dust mites do not bite and do not cause harm to humans, other than by triggering allergies/asthma.  Ways to decrease your exposure to dust mites in your home:  1. Encase mattresses, box springs and pillows with a mite-impermeable barrier or cover  2. Wash sheets, blankets and drapes weekly in hot water (130 F) with detergent and dry them in a dryer on the hot setting.  3. Have the room cleaned frequently with a vacuum cleaner and a damp dust-mop. For carpeting or rugs, vacuuming with a vacuum cleaner equipped with a high-efficiency particulate air (HEPA) filter. The dust mite allergic individual should not be in a room which is being cleaned and should wait 1 hour after cleaning before going into the room.  4. Do not sleep on upholstered furniture (eg, couches).  5. If possible removing carpeting, upholstered furniture and drapery from the home is ideal. Horizontal blinds should be eliminated in the rooms where the person spends the most time (bedroom, study, television room). Washable vinyl, roller-type  shades are optimal.  6. Remove all non-washable stuffed toys from the bedroom. Wash stuffed toys weekly like sheets and blankets above.  7. Reduce indoor humidity to less than 50%. Inexpensive humidity monitors can be purchased at most hardware stores. Do not use a humidifier as can make the problem worse and are not recommended.  Control of Cockroach Allergen Cockroach allergen has been identified as an important cause of acute attacks of asthma, especially in urban settings.  There are fifty-five species of cockroach that exist in the [place], however only three, the [place], [place] species produce allergen that can affect patients with Asthma.  Allergens can be obtained from fecal particles, egg casings and secretions from cockroaches.     Remove food sources. Reduce access to water. Seal access and entry points. Spray runways with 0.5-1% Diazinon or Chlorpyrifos Blow boric acid power under stoves and refrigerator. Place bait stations (hydramethylnon) at feeding sites.

## 2023-07-22 ENCOUNTER — Encounter: Payer: Self-pay | Admitting: Family Medicine

## 2023-07-22 ENCOUNTER — Ambulatory Visit (INDEPENDENT_AMBULATORY_CARE_PROVIDER_SITE_OTHER): Payer: No Typology Code available for payment source | Admitting: Family Medicine

## 2023-07-22 VITALS — BP 108/72 | HR 87 | Temp 97.9°F | Resp 16 | Wt 347.5 lb

## 2023-07-22 DIAGNOSIS — T7800XD Anaphylactic reaction due to unspecified food, subsequent encounter: Secondary | ICD-10-CM

## 2023-07-22 DIAGNOSIS — L501 Idiopathic urticaria: Secondary | ICD-10-CM | POA: Insufficient documentation

## 2023-07-22 DIAGNOSIS — H1010 Acute atopic conjunctivitis, unspecified eye: Secondary | ICD-10-CM | POA: Insufficient documentation

## 2023-07-22 DIAGNOSIS — J302 Other seasonal allergic rhinitis: Secondary | ICD-10-CM | POA: Diagnosis not present

## 2023-07-22 DIAGNOSIS — J3089 Other allergic rhinitis: Secondary | ICD-10-CM

## 2023-07-22 DIAGNOSIS — J4540 Moderate persistent asthma, uncomplicated: Secondary | ICD-10-CM | POA: Diagnosis not present

## 2023-07-22 DIAGNOSIS — L508 Other urticaria: Secondary | ICD-10-CM

## 2023-07-22 DIAGNOSIS — H1013 Acute atopic conjunctivitis, bilateral: Secondary | ICD-10-CM

## 2023-07-22 MED ORDER — LEVOCETIRIZINE DIHYDROCHLORIDE 5 MG PO TABS: 5.0000 mg | ORAL_TABLET | Freq: Two times a day (BID) | ORAL | 5 refills | Status: DC | PRN

## 2023-07-22 MED ORDER — MONTELUKAST SODIUM 10 MG PO TABS: 10.0000 mg | ORAL_TABLET | Freq: Every day | ORAL | 2 refills | Status: DC

## 2023-07-22 MED ORDER — CROMOLYN SODIUM 4 % OP SOLN: 2.0000 [drp] | Freq: Four times a day (QID) | OPHTHALMIC | 5 refills | Status: DC | PRN

## 2023-07-22 MED ORDER — TRELEGY ELLIPTA 200-62.5-25 MCG/ACT IN AEPB: 1.0000 | INHALATION_SPRAY | Freq: Every day | RESPIRATORY_TRACT | 5 refills | Status: DC

## 2023-07-25 ENCOUNTER — Other Ambulatory Visit (HOSPITAL_COMMUNITY): Payer: Self-pay

## 2023-07-25 ENCOUNTER — Telehealth: Payer: Self-pay

## 2023-07-25 NOTE — Telephone Encounter (Signed)
*  Asthma/Allergy  Pharmacy Patient Advocate Encounter   Received notification from CoverMyMeds that prior authorization for Trelegy Ellipta 200-62.5-25MCG/ACT aerosol powder  is required/requested.   Insurance verification completed.   The patient is insured through [facility] .   Per test claim: PA required; PA submitted to above mentioned insurance via CoverMyMeds Key/confirmation #/EOC [id number] Status is pending

## 2023-07-25 NOTE — Telephone Encounter (Signed)
 Patient has an alternative Editor, commissioning. Per her primary ins with [employer] patient is responsible for 100% of med cost - [phone]

## 2023-07-26 NOTE — Telephone Encounter (Signed)
General.Closed by health plan.We thank you for taking the time to submit your request. However, this member has alternative pharmacy benefits. [employer] [place] [facility] is the payer of last resort. Please have the pharmacy bill the member's other insurance plan first. If the member feels that this is in error, please have the member call Member Services at [phone]. If the requested medication is not covered or was denied by the [employer], an explanation of benefits or proof of denial must be submitted with the request prior to coverage by [employer] [facility].

## 2023-07-26 NOTE — Telephone Encounter (Signed)
 Called and notified patient of the note from the PA team. Patient expressed that she was going to call [employer] to see if they would cover her inhaler for the full cost. I also sent the message to patient via MyChart per her request.

## 2023-07-27 ENCOUNTER — Other Ambulatory Visit: Payer: Self-pay | Admitting: Family Medicine

## 2023-07-27 DIAGNOSIS — I10 Essential (primary) hypertension: Secondary | ICD-10-CM

## 2023-07-28 ENCOUNTER — Other Ambulatory Visit: Payer: Self-pay | Admitting: Internal Medicine

## 2023-07-28 DIAGNOSIS — G43E19 Chronic migraine with aura, intractable, without status migrainosus: Secondary | ICD-10-CM

## 2023-08-01 ENCOUNTER — Encounter: Attending: Family Medicine | Admitting: Nutrition

## 2023-08-01 DIAGNOSIS — R7303 Prediabetes: Secondary | ICD-10-CM | POA: Diagnosis not present

## 2023-08-01 DIAGNOSIS — Z6841 Body Mass Index (BMI) 40.0 and over, adult: Secondary | ICD-10-CM | POA: Insufficient documentation

## 2023-08-01 NOTE — Progress Notes (Signed)
 Medical Nutrition Therapy  Appointment Start time: 66  Appointment End time:  1115  Primary concerns today: Severe obesity  Referral diagnosis: E66.01 Preferred learning style: No Preference  Learning readiness: Ready   NUTRITION ASSESSMENT  Obesity , pre diabetes follow up Metformin twice 500mg  BID. A1C 5.9%. FBS's    100-130's Bedtime 110-120'  On Wegovy 5.0. Lost 6 lbs. Wants to get back to see a therapist. Has a lot of stress and emotional issues she is dealing with. Would benefit from seeing a theapist that works with Disordered/ Emotional eating. Working on cutting out sodas and eating better. Trying to get in more but can't walk much with her back issues. Willing to try some chair exercises on utube with Georgann Housekeeper. Goals set previously.  Eat three meals per day. B) 6-8 L)12-2 D) 5-7 -still working on it. Keep drinking water- drinking some Get in 7000 steps per day-no Talk to PCP about referral to therapist to help with mental health and emotional eating.-will do  Lose 1 lb per week. Work on meal prepping and meal planning.-needs more improvement Cut out fast food and processed foods.- still needs more work on this.  Wt Readings from Last 3 Encounters:  07/22/23 (!) 347 lb 8 oz (157.6 kg)  06/30/23 (!) 353 lb (160.1 kg)  06/14/23 (!) 353 lb (160.1 kg)   Ht Readings from Last 3 Encounters:  06/30/23 5\' 1"  (1.549 m)  06/14/23 5\' 1"  (1.549 m)  06/13/23 5\' 1"  (1.549 m)   There is no height or weight on file to calculate BMI. @BMIFA @ Facility age limit for growth %iles is 20 years. Facility age limit for growth %iles is 20 years.   Clinical    Latest Ref Rng & Units 06/30/2023    4:20 PM 06/17/2023   11:15 AM 01/31/2023    5:24 PM  CMP  Glucose 70 - 99 mg/dL 86  81  92   BUN 6 - 20 mg/dL 10  11  11    Creatinine 0.44 - 1.00 mg/dL 1.61  0.96  0.45   Sodium 135 - 145 mmol/L 135  140  135   Potassium 3.5 - 5.1 mmol/L 3.6  4.5  4.2   Chloride 98 - 111 mmol/L 99   104  101   CO2 22 - 32 mmol/L 25  23  26    Calcium 8.9 - 10.3 mg/dL 8.6  9.2  9.0   Total Protein 6.0 - 8.5 g/dL  6.7    Total Bilirubin 0.0 - 1.2 mg/dL  0.3    Alkaline Phos 44 - 121 IU/L  71    AST 0 - 40 IU/L  9    ALT 0 - 32 IU/L  10     Lipid Panel     Component Value Date/Time   CHOL 142 06/17/2023 1115   TRIG 44 06/17/2023 1115   HDL 40 06/17/2023 1115   CHOLHDL 3.6 06/17/2023 1115   LDLCALC 92 06/17/2023 1115   LABVLDL 10 06/17/2023 1115   Lab Results  Component Value Date   HGBA1C 5.9 (H) 06/17/2023    Medical Hx:  Past Medical History:  Diagnosis Date   Angio-edema    Anxiety    Arthritis    right shoulder   Asthma    Diabetes mellitus without complication (HCC)    Dyspnea    Dysrhythmia    hx of SVT   Fibroid (bleeding) (uterine)    Fibroid, uterine    H/O metrorrhagia  Hypertension    IUD (intrauterine device) in place 07/11/2015   Menorrhagia    Obesity, Class III, BMI 40-49.9 (morbid obesity) (HCC) 11/11/2018   Sleep apnea    uses CPAP - setting 11   SVT (supraventricular tachycardia) (HCC)     Medications:  Current Outpatient Medications on File Prior to Visit  Medication Sig Dispense Refill   albuterol (VENTOLIN HFA) 108 (90 Base) MCG/ACT inhaler Inhale 2 puffs into the lungs every 6 (six) hours as needed. 8 g 0   amlodipine-olmesartan (AZOR) 10-20 MG tablet TAKE ONE TABLET BY MOUTH EVERY DAY 30 tablet 1   Blood Glucose Monitoring Suppl DEVI 1 each by Does not apply route in the morning, at noon, and at bedtime. May substitute to any manufacturer covered by patient's insurance. 1 each 0   cromolyn (OPTICROM) 4 % ophthalmic solution Place 2 drops into both eyes 4 (four) times daily as needed. 10 mL 5   cyclobenzaprine (FLEXERIL) 10 MG tablet Take 1 tablet (10 mg total) by mouth every 8 (eight) hours as needed for muscle spasms. 30 tablet 1   EPINEPHrine (EPIPEN 2-PAK) 0.3 mg/0.3 mL IJ SOAJ injection Inject 0.3 mg into the muscle as needed for  anaphylaxis. 2 each 1   famotidine (PEPCID) 40 MG tablet Take 1 tablet (40 mg total) by mouth in the morning and at bedtime. 60 tablet 5   Fluticasone-Umeclidin-Vilant (TRELEGY ELLIPTA) 200-62.5-25 MCG/ACT AEPB Inhale 1 puff into the lungs daily. 28 each 5   gabapentin (NEURONTIN) 300 MG capsule Take 1 capsule (300 mg total) by mouth at bedtime. 90 capsule 3   ibuprofen (ADVIL) 600 MG tablet Take 1 tablet (600 mg total) by mouth every 6 (six) hours. (Patient not taking: Reported on 07/22/2023) 30 tablet 0   levocetirizine (XYZAL) 5 MG tablet Take 1 tablet (5 mg total) by mouth 2 (two) times daily as needed for allergies (hives/itching). 60 tablet 5   metFORMIN (GLUCOPHAGE) 1000 MG tablet Take 0.5 tablets (500 mg total) by mouth 2 (two) times daily with a meal. 180 tablet 3   montelukast (SINGULAIR) 10 MG tablet Take 1 tablet (10 mg total) by mouth at bedtime. 30 tablet 2   Multiple Vitamins-Calcium (ONE-A-DAY WOMENS PO) Take 1 tablet by mouth daily.     NON FORMULARY Pt uses a cpap nightly     promethazine-dextromethorphan (PROMETHAZINE-DM) 6.25-15 MG/5ML syrup Take 5 mLs by mouth 4 (four) times daily as needed. 118 mL 0   pyridOXINE (VITAMIN B6) 50 MG tablet Take 1 tablet (50 mg total) by mouth daily. 90 tablet 1   Semaglutide-Weight Management (WEGOVY) 0.5 MG/0.5ML SOAJ Inject 0.5 mg into the skin once a week. 2 mL 0   SUMAtriptan (IMITREX) 100 MG tablet TAKE ONE TABLET BY MOUTH EVERY 2 HOURS AS NEEDED FOR MIGRAINE. MAY REPEAT IN 2 HOURS IF HEADACHE PERSISTS OR RECURS 10 tablet 2   triamcinolone ointment (KENALOG) 0.1 % Apply twice daily for flare ups below neck, maximum 10 days. 80 g 3   Vitamin D, Ergocalciferol, (DRISDOL) 1.25 MG (50000 UNIT) CAPS capsule Take 1 capsule (50,000 Units total) by mouth every 7 (seven) days. 20 capsule 1   [DISCONTINUED] apixaban (ELIQUIS) 5 MG TABS tablet Take 2 tablets (10mg ) twice daily for 7 days, then 1 tablet (5mg ) twice daily 20 tablet 0   [DISCONTINUED]  levonorgestrel (MIRENA) 20 MCG/24HR IUD 1 each by Intrauterine route once.      Current Facility-Administered Medications on File Prior to Visit  Medication Dose  Route Frequency Provider Last Rate Last Admin   omalizumab Geoffry Paradise) prefilled syringe 300 mg  300 mg Subcutaneous Q28 days Alfonse Spruce, MD   300 mg at 04/11/23 1053   Body mass index is 65.53 kg/m. @BMIFA @ Facility age limit for growth %iles is 20 years. Facility age limit for growth %iles is 20 years.   Labs:  Lab Results  Component Value Date   HGBA1C 5.9 (H) 06/17/2023      Latest Ref Rng & Units 06/30/2023    4:20 PM 06/17/2023   11:15 AM 01/31/2023    5:24 PM  CMP  Glucose 70 - 99 mg/dL 86  81  92   BUN 6 - 20 mg/dL 10  11  11    Creatinine 0.44 - 1.00 mg/dL 6.57  8.46  9.62   Sodium 135 - 145 mmol/L 135  140  135   Potassium 3.5 - 5.1 mmol/L 3.6  4.5  4.2   Chloride 98 - 111 mmol/L 99  104  101   CO2 22 - 32 mmol/L 25  23  26    Calcium 8.9 - 10.3 mg/dL 8.6  9.2  9.0   Total Protein 6.0 - 8.5 g/dL  6.7    Total Bilirubin 0.0 - 1.2 mg/dL  0.3    Alkaline Phos 44 - 121 IU/L  71    AST 0 - 40 IU/L  9    ALT 0 - 32 IU/L  10     Lipid Panel     Component Value Date/Time   CHOL 142 06/17/2023 1115   TRIG 44 06/17/2023 1115   HDL 40 06/17/2023 1115   CHOLHDL 3.6 06/17/2023 1115   LDLCALC 92 06/17/2023 1115   LABVLDL 10 06/17/2023 1115    Notable Signs/Symptoms: Tired. Craves sweets at times. Poor sleep  Lifestyle & Dietary Hx Lives with her sister in an apartment. Working part time [employer].   Estimated daily fluid intake: 120  oz Supplements: VIt D, MVi , V6 Sleep: Bed: 10-11 pm til 10 am. Has CPAP Stress / self-care: financial Current average weekly physical activity: walks some  24-Hr Dietary Recall First Meal: 2 Boiled egg, oatmeal, water Snack:  Second Meal: 1/2 chicken breast with Cesar salad  Watermelon and grapes Third Meal:  7 pm Fish, brocccoli, Ahmed rice,  water   Estimated Energy Needs Calories: 1200 Carbohydrate: 135g Protein: 90g Fat: 33g   NUTRITION DIAGNOSIS  NI-1.7 Predicted excessive energy intake As related to High calorie diet.  As evidenced by BMI 66.   NUTRITION INTERVENTION  Nutrition education (E-1) on the following topics:  Nutrition and  Pre Diabetes education provided on My Plate, CHO counting, meal planning, portion sizes, timing of meals, avoiding snacks between meals unless having a low blood sugar, target ranges for A1C and blood sugars, signs/symptoms and treatment of hyper/hypoglycemia, monitoring blood sugars, taking medications as prescribed, benefits of exercising 30 minutes per day and prevention of complications of DM. Lifestyle Medicine  - Whole Food, Plant Predominant Nutrition is highly recommended: Eat Plenty of vegetables, Mushrooms, fruits, Legumes, Whole Grains, Nuts, seeds in lieu of processed meats, processed snacks/pastries red meat, poultry, eggs.    -It is better to avoid simple carbohydrates including: Cakes, Sweet Desserts, Ice Cream, Soda (diet and regular), Sweet Tea, Candies, Chips, Cookies, Store Bought Juices, Alcohol in Excess of  1-2 drinks a day, Lemonade,  Artificial Sweeteners, Doughnuts, Coffee Creamers, "Sugar-free" Products, etc, etc.  This is not a complete list.....  Exercise:  If you are able: 30 -60 minutes a day ,4 days a week, or 150 minutes a week.  The longer the better.  Combine stretch, strength, and aerobic activities.  If you were told in the past that you have high risk for cardiovascular diseases, you may seek evaluation by your heart doctor prior to initiating moderate to intense exercise programs. Goal  Make appt with therapist. Do Georgann Housekeeper Chair videos 3 times per week. Lose 1 lb per week.  Handouts Provided Include  Lifestyle Medicine   Learning Style & Readiness for Change Teaching method utilized: Visual & Auditory  Demonstrated degree of understanding via:  Teach Back  Barriers to learning/adherence to lifestyle change: emotional eating and possible depression   MONITORING & EVALUATION Dietary intake, weekly physical activity, and weight in 1 month.  Next Steps  Patient is to work on meal planning and getting appointment for a therapist..

## 2023-08-01 NOTE — Patient Instructions (Signed)
 Goal  Make appt with therapist. Do Jessica Sanchez Chair videos 3 times per week. Lose 1 lb per week.

## 2023-08-03 ENCOUNTER — Telehealth (INDEPENDENT_AMBULATORY_CARE_PROVIDER_SITE_OTHER): Payer: Self-pay | Admitting: Psychology

## 2023-08-03 DIAGNOSIS — F32A Depression, unspecified: Secondary | ICD-10-CM

## 2023-08-03 DIAGNOSIS — F321 Major depressive disorder, single episode, moderate: Secondary | ICD-10-CM | POA: Diagnosis not present

## 2023-08-03 DIAGNOSIS — F331 Major depressive disorder, recurrent, moderate: Secondary | ICD-10-CM

## 2023-08-05 ENCOUNTER — Ambulatory Visit: Payer: Self-pay | Admitting: Family Medicine

## 2023-08-05 ENCOUNTER — Ambulatory Visit (HOSPITAL_COMMUNITY)
Admission: RE | Admit: 2023-08-05 | Discharge: 2023-08-05 | Disposition: A | Discharge status: Home / Self Care | Source: Ambulatory Visit | Attending: Family Medicine | Admitting: Family Medicine

## 2023-08-05 ENCOUNTER — Encounter: Payer: Self-pay | Admitting: Family Medicine

## 2023-08-05 VITALS — BP 141/84 | HR 79 | Resp 16 | Ht 62.0 in | Wt 348.4 lb

## 2023-08-05 DIAGNOSIS — R32 Unspecified urinary incontinence: Secondary | ICD-10-CM

## 2023-08-05 DIAGNOSIS — M25532 Pain in left wrist: Secondary | ICD-10-CM

## 2023-08-05 MED ORDER — WEGOVY 1 MG/0.5ML ~~LOC~~ SOAJ: 1.0000 mg | SUBCUTANEOUS | 0 refills | Status: DC

## 2023-08-05 NOTE — Assessment & Plan Note (Signed)
 Encouraged to stop by [facility] to get  X-rays of the left wrist and hand to reassess for any delayed or hairline fracture. Will consider referral to orthopedics if symptoms persist, imaging shows a fracture, or if function is significantly impaired.  Recommend supportive care: Rest, ice, compression, and elevation (RICE) Over-the-counter NSAIDs (e.g., ibuprofen) for pain and inflammation, unless contraindicated Wrist brace or splint for support if needed  Patient Education: Swelling can make it difficult to detect fractures right after an injury; follow-up imaging helps confirm the diagnosis. Signs to watch for include increasing pain, bruising, numbness, tingling, or loss of motion. Avoid heavy lifting or repetitive use of the injured wrist until cleared.

## 2023-08-05 NOTE — Assessment & Plan Note (Signed)
 Refill sent to the pharmacy

## 2023-08-05 NOTE — Patient Instructions (Addendum)
 I appreciate the opportunity to provide care to you today!    Please stop by [facility] to get  X-rays of the left wrist and hand to reassess for any delayed or hairline fracture. Will consider referral to orthopedics if symptoms persist, imaging shows a fracture, or if function is significantly impaired.  Recommend supportive care: Rest, ice, compression, and elevation (RICE) Over-the-counter NSAIDs (e.g., ibuprofen) for pain and inflammation, unless contraindicated Wrist brace or splint for support if needed  Patient Education: Swelling can make it difficult to detect fractures right after an injury; follow-up imaging helps confirm the diagnosis. Signs to watch for include increasing pain, bruising, numbness, tingling, or loss of motion. Avoid heavy lifting or repetitive use of the injured wrist until cleared.     Please continue to a heart-healthy diet and increase your physical activities. Try to exercise for at least five days a week.    It was a pleasure to see you and I look forward to continuing to work together on your health and well-being. Please do not hesitate to call the office if you need care or have questions about your care.  In case of emergency, please visit the Emergency Department for urgent care, or contact our clinic at [phone] to schedule an appointment. We're here to help you!   Have a wonderful day and week. With Gratitude, Gilmore Laroche MSN, FNP-BC

## 2023-08-05 NOTE — Progress Notes (Addendum)
 Established Patient Office Visit  Subjective:  Patient ID: Jessica Sanchez, female    DOB: 09-20-1985  Age: 38 y.o. MRN: [id number]  CC:  Chief Complaint  Patient presents with   Wrist Pain    Was on vacation in [place] on 3/13 and her left wrist and fingers were swelling so she went to the ER the next day on 3/14 and she doesn't have her paperwork but they told her nothing was fractured but to follow up with her pcp to evaluate for a sprain or hairline fracture. They said it was too much swelling for them to tell her anything further    Medication Refill    Needs higher dose of wegovy     HPI Jessica Sanchez is a 38 y.o. female presents with the above complaints  for Ed f/u.  Left wrist pain: The patient reports that while on vacation in [place] she fell on 3/13 and subsequently developed swelling in her left wrist and fingers. She presented to the emergency room on 3/14 due to persistent swelling. Although she does not have documentation from the visit, she was informed that no fractures were identified on initial imaging. However, she was advised to follow up with her primary care provider to evaluate for a possible sprain or hairline fracture, as the swelling was too significant at the time to provide a definitive diagnosis.  Past Medical History:  Diagnosis Date   Angio-edema    Anxiety    Arthritis    right shoulder   Asthma    Diabetes mellitus without complication (HCC)    Dyspnea    Dysrhythmia    hx of SVT   Fibroid (bleeding) (uterine)    Fibroid, uterine    H/O metrorrhagia    Hypertension    IUD (intrauterine device) in place 07/11/2015   Menorrhagia    Obesity, Class III, BMI 40-49.9 (morbid obesity) 11/11/2018   Sleep apnea    uses CPAP - setting 11   SVT (supraventricular tachycardia) (HCC)     Past Surgical History:  Procedure Laterality Date   DILATATION AND CURETTAGE/HYSTEROSCOPY WITH MINERVA N/A 12/01/2021   Procedure: DILATATION AND  CURETTAGE/HYSTEROSCOPY WITH MINERVA;  Surgeon: Jayne Vonn DEL, MD;  Location: [facility];  Service: Gynecology;  Laterality: N/A;   HYSTERECTOMY ABDOMINAL WITH SALPINGECTOMY Bilateral 01/19/2023   Procedure: HYSTERECTOMY ABDOMINAL WITH SALPINGECTOMY;  Surgeon: Jayne Vonn DEL, MD;  Location: [facility];  Service: Gynecology;  Laterality: Bilateral;   HYSTEROSCOPY N/A 09/17/2020   Procedure: HYSTEROSCOPY;  Surgeon: Jayne Vonn DEL, MD;  Location: [facility];  Service: Gynecology;  Laterality: N/A;   HYSTEROSCOPY WITH D & C N/A 09/27/2013   Procedure: DILATATION AND CURETTAGE /HYSTEROSCOPY and insertion of Mirena  IUD ;  Surgeon: Marjorie DEL. Okey, MD;  Location: [facility];  Service: Gynecology;  Laterality: N/A;   IUD REMOVAL N/A 09/17/2020   Procedure: INTRAUTERINE DEVICE (IUD) REMOVAL (2);  Surgeon: Jayne Vonn DEL, MD;  Location: [facility];  Service: Gynecology;  Laterality: N/A;   LAPAROSCOPY N/A 01/19/2023   Procedure: LAPAROSCOPY DIAGNOSTIC;  Surgeon: Jayne Vonn DEL, MD;  Location: [facility];  Service: Gynecology;  Laterality: N/A;   MYOMECTOMY  02/03/2011   Procedure: MYOMECTOMY;  Surgeon: Vonn DEL Jayne, MD;  Location: [facility];  Service: Gynecology;  Laterality: N/A;   SVT ABLATION N/A 11/08/2018   Procedure: SVT ABLATION;  Surgeon: Waddell Danelle ORN, MD;  Location: [facility] CV LAB;  Service: Cardiovascular;  Laterality: N/A;   SVT ABLATION  N/A 12/26/2020   Procedure: SVT ABLATION;  Surgeon: Waddell Danelle ORN, MD;  Location: [facility] INVASIVE CV LAB;  Service: Cardiovascular;  Laterality: N/A;   TONSILLECTOMY     TYMPANOSTOMY TUBE PLACEMENT      Family History  Problem Relation Age of Onset   Cirrhosis Mother    Diabetes Mother    Cancer Maternal Grandfather    Hypertension Sister    Diabetes Sister    Anesthesia problems Neg Hx    Hypotension Neg Hx    Malignant hyperthermia Neg Hx    Pseudochol deficiency Neg Hx     Social History   Socioeconomic History   Marital status: Single    Spouse name: Not on file    Number of children: 0   Years of education: Not on file   Highest education level: 12th grade  Occupational History   Not on file  Tobacco Use   Smoking status: Never   Smokeless tobacco: Never   Tobacco comments:    socially  Vaping Use   Vaping status: Never Used  Substance and Sexual Activity   Alcohol use: Not Currently   Drug use: Not Currently    Types: Marijuana   Sexual activity: Not Currently    Birth control/protection: Surgical    Comment: hyst  Other Topics Concern   Not on file  Social History Narrative   Not on file   Social Drivers of Health   Financial Resource Strain: Low Risk  (05/15/2023)   Overall Financial Resource Strain (CARDIA)    Difficulty of Paying Living Expenses: Not very hard  Food Insecurity: No Food Insecurity (05/15/2023)   Hunger Vital Sign    Worried About Running Out of Food in the Last Year: Never true    Ran Out of Food in the Last Year: Never true  Transportation Needs: No Transportation Needs (05/15/2023)   PRAPARE - Administrator, Civil Service (Medical): No    Lack of Transportation (Non-Medical): No  Physical Activity: Sufficiently Active (05/15/2023)   Exercise Vital Sign    Days of Exercise per Week: 5 days    Minutes of Exercise per Session: 30 min  Stress: Stress Concern Present (05/15/2023)   [employer] of Occupational Health - Occupational Stress Questionnaire    Feeling of Stress : Rather much  Social Connections: Moderately Isolated (05/15/2023)   Social Connection and Isolation Panel    Frequency of Communication with Friends and Family: More than three times a week    Frequency of Social Gatherings with Friends and Family: Once a week    Attends Religious Services: More than 4 times per year    Active Member of [employer] or Organizations: No    Attends Engineer, structural: Not on file    Marital Status: Never married  Intimate Partner Violence: Not At Risk (01/19/2023)   Humiliation, Afraid,  Rape, and Kick questionnaire    Fear of Current or Ex-Partner: No    Emotionally Abused: No    Physically Abused: No    Sexually Abused: No    Outpatient Medications Prior to Visit  Medication Sig Dispense Refill   albuterol  (VENTOLIN  HFA) 108 (90 Base) MCG/ACT inhaler Inhale 2 puffs into the lungs every 6 (six) hours as needed. (Patient taking differently: Inhale 2 puffs into the lungs every 6 (six) hours as needed for wheezing or shortness of breath.) 8 g 0   Blood Glucose Monitoring Suppl DEVI 1 each by Does not apply route in the  morning, at noon, and at bedtime. May substitute to any manufacturer covered by patient's insurance. 1 each 0   EPINEPHrine  (EPIPEN  2-PAK) 0.3 mg/0.3 mL IJ SOAJ injection Inject 0.3 mg into the muscle as needed for anaphylaxis. 2 each 1   famotidine  (PEPCID ) 40 MG tablet Take 1 tablet (40 mg total) by mouth in the morning and at bedtime. 60 tablet 5   Fluticasone -Umeclidin-Vilant (TRELEGY ELLIPTA ) 200-62.5-25 MCG/ACT AEPB Inhale 1 puff into the lungs daily. 28 each 5   gabapentin  (NEURONTIN ) 300 MG capsule Take 1 capsule (300 mg total) by mouth at bedtime. 90 capsule 3   levocetirizine (XYZAL ) 5 MG tablet Take 1 tablet (5 mg total) by mouth 2 (two) times daily as needed for allergies (hives/itching). 60 tablet 5   metFORMIN  (GLUCOPHAGE ) 1000 MG tablet Take 0.5 tablets (500 mg total) by mouth 2 (two) times daily with a meal. 180 tablet 3   montelukast  (SINGULAIR ) 10 MG tablet Take 1 tablet (10 mg total) by mouth at bedtime. 30 tablet 2   Multiple Vitamins-Calcium (ONE-A-DAY WOMENS PO) Take 1 tablet by mouth daily.     NON FORMULARY Pt uses a cpap nightly     SUMAtriptan  (IMITREX ) 100 MG tablet TAKE ONE TABLET BY MOUTH EVERY 2 HOURS AS NEEDED FOR MIGRAINE. MAY REPEAT IN 2 HOURS IF HEADACHE PERSISTS OR RECURS 10 tablet 2   triamcinolone  ointment (KENALOG ) 0.1 % Apply twice daily for flare ups below neck, maximum 10 days. 80 g 3   Vitamin D , Ergocalciferol , (DRISDOL )  1.25 MG (50000 UNIT) CAPS capsule Take 1 capsule (50,000 Units total) by mouth every 7 (seven) days. 20 capsule 1   amlodipine -olmesartan  (AZOR ) 10-20 MG tablet TAKE ONE TABLET BY MOUTH EVERY DAY 30 tablet 1   cromolyn  (OPTICROM ) 4 % ophthalmic solution Place 2 drops into both eyes 4 (four) times daily as needed. (Patient not taking: Reported on 10/24/2023) 10 mL 5   cyclobenzaprine  (FLEXERIL ) 10 MG tablet Take 1 tablet (10 mg total) by mouth every 8 (eight) hours as needed for muscle spasms. (Patient not taking: Reported on 10/05/2023) 30 tablet 1   ibuprofen  (ADVIL ) 600 MG tablet Take 1 tablet (600 mg total) by mouth every 6 (six) hours. (Patient not taking: Reported on 10/05/2023) 30 tablet 0   promethazine -dextromethorphan (PROMETHAZINE -DM) 6.25-15 MG/5ML syrup Take 5 mLs by mouth 4 (four) times daily as needed. 118 mL 0   pyridOXINE (VITAMIN B6) 50 MG tablet Take 1 tablet (50 mg total) by mouth daily. (Patient not taking: Reported on 10/24/2023) 90 tablet 1   Semaglutide -Weight Management (WEGOVY ) 0.5 MG/0.5ML SOAJ Inject 0.5 mg into the skin once a week. 2 mL 0   Facility-Administered Medications Prior to Visit  Medication Dose Route Frequency Provider Last Rate Last Admin   omalizumab  (XOLAIR ) prefilled syringe 300 mg  300 mg Subcutaneous Q28 days Iva Marty Saltness, MD   300 mg at 04/11/23 1053    Allergies  Allergen Reactions   Shellfish Allergy  Shortness Of Breath, Itching and Swelling    Mouth became swollen, throat started itching, and developed shortness of breath   Sulfa  Antibiotics Shortness Of Breath, Itching and Swelling    Mouth became swollen, throat started itching, and developed shortness of breath   Adhesive [Tape] Itching    Depends on the type of medical tape    ROS Review of Systems  Constitutional:  Negative for chills and fever.  Eyes:  Negative for visual disturbance.  Respiratory:  Negative for chest tightness and shortness  of breath.   Genitourinary:         Incontience  Musculoskeletal:        Left wrist injury  Neurological:  Negative for dizziness and headaches.      Objective:    Physical Exam HENT:     Head: Normocephalic.     Mouth/Throat:     Mouth: Mucous membranes are moist.  Cardiovascular:     Rate and Rhythm: Normal rate.     Heart sounds: Normal heart sounds.  Pulmonary:     Effort: Pulmonary effort is normal.     Breath sounds: Normal breath sounds.  Musculoskeletal:     Right wrist: No swelling or tenderness. Normal range of motion. Normal pulse.     Left wrist: Swelling and tenderness present. Decreased range of motion. Normal pulse.  Neurological:     Mental Status: She is alert.     BP (!) 141/84   Pulse 79   Resp 16   Ht 5' 2 (1.575 m)   Wt (!) 348 lb 6.4 oz (158 kg)   LMP 12/26/2022 (Approximate)   SpO2 96%   BMI 63.72 kg/m  Wt Readings from Last 3 Encounters:  10/24/23 (!) 350 lb (158.8 kg)  10/21/23 (!) 350 lb 6.4 oz (158.9 kg)  10/05/23 (!) 345 lb 0.6 oz (156.5 kg)    Lab Results  Component Value Date   TSH 3.070 10/21/2023   Lab Results  Component Value Date   WBC 5.7 10/24/2023   HGB 11.7 (L) 10/24/2023   HCT 36.9 10/24/2023   MCV 83.9 10/24/2023   PLT 317 10/24/2023   Lab Results  Component Value Date   NA 137 10/24/2023   K 4.6 10/24/2023   CO2 20 (L) 10/24/2023   GLUCOSE 84 10/24/2023   BUN 10 10/24/2023   CREATININE 0.66 10/24/2023   BILITOT 0.7 10/24/2023   ALKPHOS 63 10/24/2023   AST 15 10/24/2023   ALT 14 10/24/2023   PROT 7.9 10/24/2023   ALBUMIN 3.9 10/24/2023   CALCIUM 9.2 10/24/2023   ANIONGAP 14 10/24/2023   EGFR 116 06/17/2023   Lab Results  Component Value Date   CHOL 142 06/17/2023   Lab Results  Component Value Date   HDL 40 06/17/2023   Lab Results  Component Value Date   LDLCALC 92 06/17/2023   Lab Results  Component Value Date   TRIG 44 06/17/2023   Lab Results  Component Value Date   CHOLHDL 3.6 06/17/2023   Lab Results  Component  Value Date   HGBA1C 5.9 (H) 06/17/2023      Assessment & Plan:  Acute wrist pain, left Assessment & Plan: Encouraged to stop by [facility] to get  X-rays of the left wrist and hand to reassess for any delayed or hairline fracture. Will consider referral to orthopedics if symptoms persist, imaging shows a fracture, or if function is significantly impaired.  Recommend supportive care: Rest, ice, compression, and elevation (RICE) Over-the-counter NSAIDs (e.g., ibuprofen ) for pain and inflammation, unless contraindicated Wrist brace or splint for support if needed  Patient Education: Swelling can make it difficult to detect fractures right after an injury; follow-up imaging helps confirm the diagnosis. Signs to watch for include increasing pain, bruising, numbness, tingling, or loss of motion. Avoid heavy lifting or repetitive use of the injured wrist until cleared.  Orders: -     DG Wrist Complete Left  Morbid obesity (HCC) Assessment & Plan: Refill sent to the pharmacy  Incontinence in female Assessment & Plan: The patient reports that symptoms began about one month ago. She is experiencing both urge and stress urinary incontinence and is requesting urinary supplies from [employer]. -She was encouraged to begin nonpharmacological interventions, including: -Pelvic floor (Kegel) exercises to strengthen bladder control -Bladder training by gradually increasing the time between voids -Limiting caffeine and alcohol, which may irritate the bladder -Maintaining a healthy weight to reduce pressure on the bladder -Scheduled toileting to prevent accidents -The patient was advised to follow up if symptoms worsen or do not improve with conservative measures.    Note: This chart has been completed using Engineer, civil (consulting) software, and while attempts have been made to ensure accuracy, certain words and phrases may not be transcribed as intended.    Follow-up: No follow-ups  on file.   Elinda Bunten, FNP

## 2023-08-06 ENCOUNTER — Encounter: Payer: Self-pay | Admitting: Family Medicine

## 2023-08-08 ENCOUNTER — Encounter: Payer: Self-pay | Admitting: Nutrition

## 2023-08-09 ENCOUNTER — Ambulatory Visit (INDEPENDENT_AMBULATORY_CARE_PROVIDER_SITE_OTHER): Payer: Self-pay | Admitting: Psychology

## 2023-08-09 DIAGNOSIS — F321 Major depressive disorder, single episode, moderate: Secondary | ICD-10-CM

## 2023-08-09 DIAGNOSIS — F331 Major depressive disorder, recurrent, moderate: Secondary | ICD-10-CM

## 2023-08-14 DIAGNOSIS — G4733 Obstructive sleep apnea (adult) (pediatric): Secondary | ICD-10-CM | POA: Diagnosis not present

## 2023-08-17 ENCOUNTER — Ambulatory Visit: Admitting: Psychology

## 2023-08-17 DIAGNOSIS — F321 Major depressive disorder, single episode, moderate: Secondary | ICD-10-CM | POA: Diagnosis not present

## 2023-08-17 DIAGNOSIS — F331 Major depressive disorder, recurrent, moderate: Secondary | ICD-10-CM

## 2023-08-30 ENCOUNTER — Other Ambulatory Visit: Admitting: Psychology

## 2023-09-09 ENCOUNTER — Ambulatory Visit: Admitting: Psychology

## 2023-09-09 DIAGNOSIS — F321 Major depressive disorder, single episode, moderate: Secondary | ICD-10-CM | POA: Diagnosis not present

## 2023-09-09 DIAGNOSIS — F419 Anxiety disorder, unspecified: Secondary | ICD-10-CM

## 2023-09-09 DIAGNOSIS — F32A Depression, unspecified: Secondary | ICD-10-CM | POA: Diagnosis not present

## 2023-09-13 DIAGNOSIS — G4733 Obstructive sleep apnea (adult) (pediatric): Secondary | ICD-10-CM | POA: Diagnosis not present

## 2023-09-20 ENCOUNTER — Emergency Department (HOSPITAL_COMMUNITY)

## 2023-09-20 ENCOUNTER — Other Ambulatory Visit: Payer: Self-pay

## 2023-09-20 ENCOUNTER — Emergency Department (HOSPITAL_COMMUNITY)
Admission: EM | Admit: 2023-09-20 | Discharge: 2023-09-20 | Disposition: A | Discharge status: Home / Self Care | Attending: Emergency Medicine | Admitting: Emergency Medicine

## 2023-09-20 ENCOUNTER — Encounter (HOSPITAL_COMMUNITY): Payer: Self-pay | Admitting: *Deleted

## 2023-09-20 DIAGNOSIS — R1032 Left lower quadrant pain: Secondary | ICD-10-CM | POA: Insufficient documentation

## 2023-09-20 DIAGNOSIS — R112 Nausea with vomiting, unspecified: Secondary | ICD-10-CM | POA: Diagnosis not present

## 2023-09-20 LAB — URINALYSIS, ROUTINE W REFLEX MICROSCOPIC
Bilirubin Urine: NEGATIVE
Glucose, UA: NEGATIVE mg/dL
Hgb urine dipstick: NEGATIVE
Ketones, ur: NEGATIVE mg/dL
Leukocytes,Ua: NEGATIVE
Nitrite: NEGATIVE
Protein, ur: NEGATIVE mg/dL
Specific Gravity, Urine: 1.021 (ref 1.005–1.030)
pH: 6 (ref 5.0–8.0)

## 2023-09-20 LAB — CBC
HCT: 36.8 % (ref 36.0–46.0)
Hemoglobin: 11.2 g/dL — ABNORMAL LOW (ref 12.0–15.0)
MCH: 26 pg (ref 26.0–34.0)
MCHC: 30.4 g/dL (ref 30.0–36.0)
MCV: 85.4 fL (ref 80.0–100.0)
Platelets: 316 10*3/uL (ref 150–400)
RBC: 4.31 MIL/uL (ref 3.87–5.11)
RDW: 15.4 % (ref 11.5–15.5)
WBC: 6.5 10*3/uL (ref 4.0–10.5)
nRBC: 0 % (ref 0.0–0.2)

## 2023-09-20 LAB — COMPREHENSIVE METABOLIC PANEL WITH GFR
ALT: 14 U/L (ref 0–44)
AST: 11 U/L — ABNORMAL LOW (ref 15–41)
Albumin: 3.5 g/dL (ref 3.5–5.0)
Alkaline Phosphatase: 61 U/L (ref 38–126)
Anion gap: 8 (ref 5–15)
BUN: 10 mg/dL (ref 6–20)
CO2: 26 mmol/L (ref 22–32)
Calcium: 8.9 mg/dL (ref 8.9–10.3)
Chloride: 98 mmol/L (ref 98–111)
Creatinine, Ser: 0.58 mg/dL (ref 0.44–1.00)
GFR, Estimated: >60 mL/min (ref >60)
Glucose, Bld: 86 mg/dL (ref 70–99)
Potassium: 3.9 mmol/L (ref 3.5–5.1)
Sodium: 132 mmol/L — ABNORMAL LOW (ref 135–145)
Total Bilirubin: 0.3 mg/dL (ref 0.0–1.2)
Total Protein: 7.2 g/dL (ref 6.5–8.1)

## 2023-09-20 LAB — LIPASE, BLOOD: Lipase: 25 U/L (ref 11–51)

## 2023-09-20 MED ORDER — ONDANSETRON 4 MG PO TBDP: 4.0000 mg | ORAL_TABLET | Freq: Three times a day (TID) | ORAL | 0 refills | Status: DC | PRN

## 2023-09-20 MED ORDER — IOHEXOL 300 MG/ML  SOLN
100.0000 mL | Freq: Once | INTRAMUSCULAR | Status: AC | PRN
  Administered 2023-09-20: 100 mL via INTRAVENOUS

## 2023-09-20 MED ORDER — HYDROCODONE-ACETAMINOPHEN 5-325 MG PO TABS: 1.0000 | ORAL_TABLET | ORAL | 0 refills | Status: DC | PRN

## 2023-09-20 MED ORDER — HYDROMORPHONE HCL 1 MG/ML IJ SOLN
0.5000 mg | Freq: Once | INTRAMUSCULAR | Status: AC
  Administered 2023-09-20: 0.5 mg via INTRAVENOUS
  Filled 2023-09-20: qty 0.5

## 2023-09-20 MED ORDER — ONDANSETRON HCL 4 MG/2ML IJ SOLN
4.0000 mg | Freq: Once | INTRAMUSCULAR | Status: AC
  Administered 2023-09-20: 4 mg via INTRAVENOUS
  Filled 2023-09-20: qty 2

## 2023-09-20 NOTE — Discharge Instructions (Signed)
 You were seen for abdominal pain in the emergency department.   At home, please take zofran . Take ibuprofen  and norco for your pain.    Check your MyChart online for the results of any tests that had not resulted by the time you left the emergency department.   Follow-up with your primary doctor in 2-3 days regarding your visit.    Return immediately to the emergency department if you experience any of the following: worsening pain, vomiting despite the medication, or any other concerning symptoms.    Thank you for visiting our Emergency Department. It was a pleasure taking care of you today.

## 2023-09-20 NOTE — ED Provider Notes (Signed)
 [place] EMERGENCY DEPARTMENT AT [facility] Provider Note   CSN: [id number] Arrival date & time: 09/20/23  1645     History  Chief Complaint  Patient presents with   Abdominal Pain    Jessica Sanchez is a 38 y.o. female.  38 year old female history of total abdominal hysterectomy presents emergency department with left lower quadrant abdominal pain.  Patient reports that she was having intermittent left lower quadrant abdominal pain for the past 2 days.  Says that today it became more severe and constant.  Also is having some nausea and vomiting.  No fevers.  No diarrhea.       Home Medications Prior to Admission medications   Medication Sig Start Date End Date Taking? Authorizing Provider  HYDROcodone -acetaminophen  (NORCO/VICODIN) 5-325 MG tablet Take 1 tablet by mouth every 4 (four) hours as needed. 09/20/23  Yes Ninetta Basket, MD  ondansetron  (ZOFRAN -ODT) 4 MG disintegrating tablet Take 1 tablet (4 mg total) by mouth every 8 (eight) hours as needed for nausea or vomiting. 09/20/23  Yes Ninetta Basket, MD  albuterol  (VENTOLIN  HFA) 108 (90 Base) MCG/ACT inhaler Inhale 2 puffs into the lungs every 6 (six) hours as needed. 06/27/23   Leath-Warren, Belen Bowers, NP  amlodipine -olmesartan  (AZOR ) 10-20 MG tablet TAKE ONE TABLET BY MOUTH EVERY DAY 07/27/23   Zarwolo, Gloria, FNP  Blood Glucose Monitoring Suppl DEVI 1 each by Does not apply route in the morning, at noon, and at bedtime. May substitute to any manufacturer covered by patient's insurance. 01/11/23   Zarwolo, Gloria, FNP  cromolyn  (OPTICROM ) 4 % ophthalmic solution Place 2 drops into both eyes 4 (four) times daily as needed. 07/22/23   Ardie Kras, FNP  cyclobenzaprine  (FLEXERIL ) 10 MG tablet Take 1 tablet (10 mg total) by mouth every 8 (eight) hours as needed for muscle spasms. 02/03/23   Wendelyn Halter, MD  EPINEPHrine  (EPIPEN  2-PAK) 0.3 mg/0.3 mL IJ SOAJ injection Inject 0.3 mg into the muscle as needed for  anaphylaxis. 02/16/23   Rochester Chuck, MD  famotidine  (PEPCID ) 40 MG tablet Take 1 tablet (40 mg total) by mouth in the morning and at bedtime. 02/02/23   Rochester Chuck, MD  Fluticasone -Umeclidin-Vilant (TRELEGY ELLIPTA ) 200-62.5-25 MCG/ACT AEPB Inhale 1 puff into the lungs daily. 07/22/23   Ardie Kras, FNP  gabapentin  (NEURONTIN ) 300 MG capsule Take 1 capsule (300 mg total) by mouth at bedtime. 06/13/23   Zarwolo, Gloria, FNP  ibuprofen  (ADVIL ) 600 MG tablet Take 1 tablet (600 mg total) by mouth every 6 (six) hours. 01/22/23   Wendelyn Halter, MD  levocetirizine (XYZAL ) 5 MG tablet Take 1 tablet (5 mg total) by mouth 2 (two) times daily as needed for allergies (hives/itching). 07/22/23   Ardie Kras, FNP  metFORMIN  (GLUCOPHAGE ) 1000 MG tablet Take 0.5 tablets (500 mg total) by mouth 2 (two) times daily with a meal. 01/11/23   Zarwolo, Gloria, FNP  montelukast  (SINGULAIR ) 10 MG tablet Take 1 tablet (10 mg total) by mouth at bedtime. 07/22/23   Ardie Kras, FNP  Multiple Vitamins-Calcium (ONE-A-DAY WOMENS PO) Take 1 tablet by mouth daily.    [provider]  NON FORMULARY Pt uses a cpap nightly    [provider]  pyridOXINE (VITAMIN B6) 50 MG tablet Take 1 tablet (50 mg total) by mouth daily. 06/13/23   Zarwolo, Gloria, FNP  Semaglutide -Weight Management (WEGOVY ) 1 MG/0.5ML SOAJ Inject 1 mg into the skin once a week. 08/05/23  Zarwolo, Gloria, FNP  SUMAtriptan  (IMITREX ) 100 MG tablet TAKE ONE TABLET BY MOUTH EVERY 2 HOURS AS NEEDED FOR MIGRAINE. MAY REPEAT IN 2 HOURS IF HEADACHE PERSISTS OR RECURS 07/28/23   Zarwolo, Gloria, FNP  triamcinolone  ointment (KENALOG ) 0.1 % Apply twice daily for flare ups below neck, maximum 10 days. 04/11/23   Kandice Orleans, MD  Vitamin D , Ergocalciferol , (DRISDOL ) 1.25 MG (50000 UNIT) CAPS capsule Take 1 capsule (50,000 Units total) by mouth every 7 (seven) days. 06/27/23   Zarwolo, Gloria, FNP  apixaban  (ELIQUIS ) 5 MG TABS tablet Take 2 tablets  (10mg ) twice daily for 7 days, then 1 tablet (5mg ) twice daily 04/20/19 04/20/19  Kandra Orn A, PA-C  levonorgestrel  (MIRENA ) 20 MCG/24HR IUD 1 each by Intrauterine route once.   06/25/20  [provider]      Allergies    Shellfish allergy , Sulfa  antibiotics, and Adhesive [tape]    Review of Systems   Review of Systems  Physical Exam Updated Vital Signs BP 122/75   Pulse 85   Temp 98 F (36.7 C) (Oral)   Resp 18   Ht 5\' 1"  (1.549 m)   Wt (!) 159.2 kg   LMP 12/26/2022 (Approximate)   SpO2 99%   BMI 66.30 kg/m  Physical Exam Vitals and nursing note reviewed.  Constitutional:      General: She is not in acute distress.    Appearance: She is well-developed.  HENT:     Head: Normocephalic and atraumatic.     Right Ear: External ear normal.     Left Ear: External ear normal.     Nose: Nose normal.  Abdominal:     General: Abdomen is flat. There is no distension.     Palpations: Abdomen is soft. There is no mass.     Tenderness: There is abdominal tenderness (LLQ). There is no guarding.  Musculoskeletal:     Cervical back: Normal range of motion and neck supple.  Neurological:     Mental Status: She is alert and oriented to person, place, and time. Mental status is at baseline.  Psychiatric:        Mood and Affect: Mood normal.     ED Results / Procedures / Treatments   Labs (all labs ordered are listed, but only abnormal results are displayed) Labs Reviewed  COMPREHENSIVE METABOLIC PANEL WITH GFR - Abnormal; Notable for the following components:      Result Value   Sodium 132 (*)    AST 11 (*)    All other components within normal limits  CBC - Abnormal; Notable for the following components:   Hemoglobin 11.2 (*)    All other components within normal limits  LIPASE, BLOOD  URINALYSIS, ROUTINE W REFLEX MICROSCOPIC    EKG None  Radiology CT ABDOMEN PELVIS W CONTRAST Result Date: 09/20/2023 CLINICAL DATA:  Left lower quadrant pain, nausea, vomiting  EXAM: CT ABDOMEN AND PELVIS WITH CONTRAST TECHNIQUE: Multidetector CT imaging of the abdomen and pelvis was performed using the standard protocol following bolus administration of intravenous contrast. RADIATION DOSE REDUCTION: This exam was performed according to the departmental dose-optimization program which includes automated exposure control, adjustment of the mA and/or kV according to patient size and/or use of iterative reconstruction technique. CONTRAST:  OMNIPAQUE  IOHEXOL  300 MG/ML  SOLN COMPARISON:  02/15/2022 FINDINGS: Lower chest: No acute abnormality Hepatobiliary: Small subcentimeter hypodensity posteriorly in the right hepatic lobe most compatible with cyst, unchanged. No suspicious focal hepatic abnormality or biliary ductal dilatation.  Gallbladder unremarkable. Pancreas: No focal abnormality or ductal dilatation. Spleen: No focal abnormality.  Normal size. Adrenals/Urinary Tract: No adrenal abnormality. No focal renal abnormality. No stones or hydronephrosis. Urinary bladder is unremarkable. Stomach/Bowel: Normal appendix. Stomach, large and small bowel grossly unremarkable. Vascular/Lymphatic: No evidence of aneurysm or adenopathy. Reproductive: Uterus and adnexa unremarkable.  No mass. Other: No free fluid or free air. Small umbilical hernia containing fat. Musculoskeletal: No acute bony abnormality. IMPRESSION: No acute findings in the abdomen or pelvis. Small umbilical hernia containing fat. Electronically Signed   By: Janeece Mechanic M.D.   On: 09/20/2023 22:00    Procedures Procedures    Medications Ordered in ED Medications  HYDROmorphone  (DILAUDID ) injection 0.5 mg (0.5 mg Intravenous Given 09/20/23 2143)  ondansetron  (ZOFRAN ) injection 4 mg (4 mg Intravenous Given 09/20/23 2143)  iohexol  (OMNIPAQUE ) 300 MG/ML solution 100 mL (100 mLs Intravenous Contrast Given 09/20/23 2148)    ED Course/ Medical Decision Making/ A&P                                 Medical Decision  Making Amount and/or Complexity of Data Reviewed Labs: ordered. Radiology: ordered.  Risk Prescription drug management.   CATLYNN GRONDAHL is a 38 y.o. female with comorbidities that complicate the patient evaluation including total abdominal hysterectomy presents emergency department with left lower quadrant abdominal pain.   Initial Ddx:  Diverticulitis, colitis, UTI, ovarian torsion, kidney stone, pyelonephritis  MDM:  Based on the patient's symptoms feel that they likely have diverticulitis.  Will obtain CT scan which will show if they have colitis which could also be causing similar symptoms.  This will also show they have a kidney stone.  With the lower abdominal pain we will send off urinalysis to test for UTI and pyelonephritis.   Also considered ovarian torsion and if CT does show enlarged ovary we will talk to OB/GYN and possibly obtain transvaginal ultrasound but feel this is less likely.  Plan:  Labs Urinalysis CT abdomen pelvis IV contrast Pain and nausea medication  ED Summary/Re-evaluation:  CT without acute findings.  No large ovarian cysts or swelling to suggest torsion.  Did have an umbilical fat-containing hernia but is not having pain in that location.  Will have her follow-up with her primary doctor in several days regarding her abdominal pain.  Sent home with pain and nausea medication.    This patient presents to the ED for concern of complaints listed in HPI, this involves an extensive number of treatment options, and is a complaint that carries with it a high risk of complications and morbidity. Disposition including potential need for admission considered.   Dispo: DC Home. Return precautions discussed including, but not limited to, those listed in the AVS. Allowed pt time to ask questions which were answered fully prior to dc.  Records reviewed Outpatient Clinic Notes The following labs were independently interpreted: Chemistry and show no acute abnormality I  independently reviewed the following imaging with scope of interpretation limited to determining acute life threatening conditions related to emergency care: CT Abdomen/Pelvis and agree with the radiologist interpretation with the following exceptions: none I have reviewed the patients home medications and made adjustments as needed    Final Clinical Impression(s) / ED Diagnoses Final diagnoses:  Left lower quadrant pain  Nausea and vomiting, unspecified vomiting type    Rx / DC Orders ED Discharge Orders  Ordered    ondansetron  (ZOFRAN -ODT) 4 MG disintegrating tablet  Every 8 hours PRN        09/20/23 2314    HYDROcodone -acetaminophen  (NORCO/VICODIN) 5-325 MG tablet  Every 4 hours PRN        09/20/23 2314              Ninetta Basket, MD 09/21/23 [phone]

## 2023-09-20 NOTE — ED Triage Notes (Signed)
 Pt with abd pain since yesterday.  Emesis today. Mid to left lower abd that radiates around to back.

## 2023-09-21 ENCOUNTER — Ambulatory Visit: Admitting: Psychology

## 2023-09-21 DIAGNOSIS — F321 Major depressive disorder, single episode, moderate: Secondary | ICD-10-CM | POA: Diagnosis not present

## 2023-09-21 DIAGNOSIS — F419 Anxiety disorder, unspecified: Secondary | ICD-10-CM

## 2023-09-21 DIAGNOSIS — F32A Depression, unspecified: Secondary | ICD-10-CM

## 2023-09-27 NOTE — Progress Notes (Signed)
 Client name: Jessica Sanchez Therapist name: Eligah Grow Fredrick Jenkins Date: 08/09/2023 Time: 60 mins   Data: Client stated that it took a while to recover from her first session. She reported that it just brought up a bunch of emotions but that she was able to work through them. She also stated that it was good to get that out. Client reports depressive symptoms from a 4 to 2 with the use of interventions.   Assessment: The client was able to communicate what she felt last week very clearly. She was very aware and seemed to have learned some things about herself. We discussed the importance of figuring out those triggers and we began discussing coping skills.   Plan: The client will attend bi-weekly sessions with a goal of attending at least 2 per month.  Client will take a moment of mindfulness daily and utilize box breathing as needed to reduce symptoms. A CCA will be completed within the next session as we learn more.

## 2023-09-27 NOTE — Progress Notes (Signed)
 Client name: Jessica Sanchez Therapist name: Eligah Grow Fredrick Jenkins Date: 08/03/2023 Time: 60 mins  Data: This was the first session with the client. Client stated that she had struggled with mild depression as long as she can remember. She stated a little about her life and what she has gone through. She reported that she is glad that she finally has a therapist. She reported that she has needed to talk to someone for a while.   Assessment: The client appeared to be in good spirits despite telling me about her past. She has been through a lot and there is some trauma there. She seems to push through and handle it well. It is good that she has sought out counseling, so that she can talk through some it and work on a path of healing.   Plan: The client will attend bi-weekly sessions with a goal of attending at least 2 per month. Client will work on reducing depressive symptoms from a 6 to 2 with the use of interventions. Client will take a moment of mindfulness daily and utilize box breathing as needed to reduce symptoms. A CCA will be completed within the next few sessions as we learn more.

## 2023-09-30 ENCOUNTER — Ambulatory Visit: Admitting: Psychology

## 2023-09-30 DIAGNOSIS — F321 Major depressive disorder, single episode, moderate: Secondary | ICD-10-CM

## 2023-10-03 ENCOUNTER — Ambulatory Visit: Payer: Self-pay

## 2023-10-03 NOTE — Telephone Encounter (Signed)
 Copied from CRM [id number]. Topic: Clinical - Red Word Triage >> Oct 03, 2023  1:51 PM Chrystal Crape R wrote: Pt has really bad headaches and would like to see the provider sooner than 06/11. Has a history of migraines however these headaches are so extreme she can't function.   Chief Complaint: Headache  Symptoms: Headache  Frequency: Intermittent  Disposition: [] ED /[] Urgent Care (no appt availability in office) / [x] Appointment(In office/virtual)/ []  [place] [facility]/ [] Home Care/ [] Refused Recommended Disposition /[] [place] [facility]/ []  Follow-up with PCP Additional Notes: Patient reports a history of migraines and states that she has had intermittent headaches for the last 2 weeks that are becoming more persistent. She states she has been taking her usual medication without relief. Appointment made for the patient on 6/4. Patient instructed to call back for new or worsening symptoms. Patient verbalized understanding and agreement with this plan.    Patient also would like her PCP to know she is ready for the next higher dose of Wegovy .    Reason for Disposition  Headache is a chronic symptom (recurrent or ongoing AND present > 4 weeks)  Answer Assessment - Initial Assessment Questions 1. LOCATION: "Where does it hurt?"      Left sided head  2. ONSET: "When did the headache start?" (Minutes, hours or days)      1-2 weeks 3. PATTERN: "Does the pain come and go, or has it been constant since it started?"     Intermittent  4. SEVERITY: "How bad is the pain?" and "What does it keep you from doing?"  (e.g., Scale 1-10; mild, moderate, or severe)   - MILD (1-3): doesn't interfere with normal activities    - MODERATE (4-7): interferes with normal activities or awakens from sleep    - SEVERE (8-10): excruciating pain, unable to do any normal activities        Moderate to severe  5. RECURRENT SYMPTOM: "Have you ever had headaches before?" If Yes, ask: "When was the last time?" and "What  happened that time?"      Yes 6. CAUSE: "What do you think is causing the headache?"     Possible migraine  7. MIGRAINE: "Have you been diagnosed with migraine headaches?" If Yes, ask: "Is this headache similar?"      Yes 8. HEAD INJURY: "Has there been any recent injury to the head?"      No 9. OTHER SYMPTOMS: "Do you have any other symptoms?" (fever, stiff neck, eye pain, sore throat, cold symptoms)     No 10. PREGNANCY: "Is there any chance you are pregnant?" "When was your last menstrual period?"       No  Protocols used: Headache-A-AH

## 2023-10-03 NOTE — Telephone Encounter (Signed)
 Patient scheduled.

## 2023-10-04 ENCOUNTER — Other Ambulatory Visit: Payer: Self-pay | Admitting: Family Medicine

## 2023-10-04 DIAGNOSIS — I10 Essential (primary) hypertension: Secondary | ICD-10-CM

## 2023-10-05 ENCOUNTER — Ambulatory Visit (INDEPENDENT_AMBULATORY_CARE_PROVIDER_SITE_OTHER): Payer: Self-pay

## 2023-10-05 ENCOUNTER — Other Ambulatory Visit (HOSPITAL_COMMUNITY): Payer: Self-pay

## 2023-10-05 ENCOUNTER — Telehealth: Payer: Self-pay | Admitting: Pharmacy Technician

## 2023-10-05 VITALS — BP 138/81 | HR 81 | Ht 61.0 in | Wt 345.0 lb

## 2023-10-05 DIAGNOSIS — G43E19 Chronic migraine with aura, intractable, without status migrainosus: Secondary | ICD-10-CM

## 2023-10-05 DIAGNOSIS — Z6841 Body Mass Index (BMI) 40.0 and over, adult: Secondary | ICD-10-CM

## 2023-10-05 DIAGNOSIS — G4733 Obstructive sleep apnea (adult) (pediatric): Secondary | ICD-10-CM | POA: Diagnosis not present

## 2023-10-05 MED ORDER — TIRZEPATIDE-WEIGHT MANAGEMENT 2.5 MG/0.5ML ~~LOC~~ SOLN: 2.5000 mg | SUBCUTANEOUS | 1 refills | Status: DC

## 2023-10-05 NOTE — Progress Notes (Unsigned)
 Established Patient Office Visit  Subjective   Patient ID: Jessica Sanchez, female    DOB: 11-Mar-1986  Age: 38 y.o. MRN: [id number]  Chief Complaint  Patient presents with   Medical Management of Chronic Issues    Pt states "Mirgaines in back if head and goes intio her neck, pain so bad she can't function, Imitrex  not helping"    HPI Headache: Patient complains of headache. She {does/does not:33181} have a headache at this time.   Description of Headaches: Location of pain: {location:16513} Radiation of pain?:{headache location:16513} Character of pain:{description; pain character:10965} Severity of pain: {numbers; 0-10:33138} Accompanying symptoms: {accompanying sx:16515} Prodromal sx?: {accompanying sx:16515} Rapidity of onset: {onset:31634} Typical duration of individual headache: {numbers; 0-10:33138} {time units:11} Are most headaches similar in presentation? {yes/no:64} Typical precipitants: {ha precipitants:16526}  Temporal Pattern of Headaches: Started having HAs {numbers; 0-10:33138} {time units:11} ago Worst time of day: {time of day:16448} Awaken from sleep?: {yes***/no:17258} Seasonal pattern?: {yes***/no:17258} 'Clustering' of HAs over time? {yes***/no:17258} Overall pattern since problem began: {course:17::"unchanged"}  Degree of Functional Impairment: {severity:5014}  Current Use of Meds to Treat HA: Abortive meds? {ha abortive:16516} Daily use? {yes***/no:17258} Prophylactic meds? {ha proph:16517}  Additional Relevant History: History of head/neck trauma? {yes***/no:17258} History of head/neck surgery? {yes***/no:17258} Family h/o headache problems? {yes***/no:17258} Use of meds that might worsen HAs? {yes***/no:17258} Exposure to carbon monoxide? {yes***/no:17258} Substance use: {ha substance:16518}  Past Medical History:  Diagnosis Date   Angio-edema    Anxiety    Arthritis    right shoulder   Asthma    Diabetes mellitus without complication  (HCC)    Dyspnea    Dysrhythmia    hx of SVT   Fibroid (bleeding) (uterine)    Fibroid, uterine    H/O metrorrhagia    Hypertension    IUD (intrauterine device) in place 07/11/2015   Menorrhagia    Obesity, Class III, BMI 40-49.9 (morbid obesity) 11/11/2018   Sleep apnea    uses CPAP - setting 11   SVT (supraventricular tachycardia) (HCC)    Past Surgical History:  Procedure Laterality Date   DILATATION AND CURETTAGE/HYSTEROSCOPY WITH MINERVA N/A 12/01/2021   Procedure: DILATATION AND CURETTAGE/HYSTEROSCOPY WITH MINERVA;  Surgeon: Wendelyn Halter, MD;  Location: [facility];  Service: Gynecology;  Laterality: N/A;   HYSTERECTOMY ABDOMINAL WITH SALPINGECTOMY Bilateral 01/19/2023   Procedure: HYSTERECTOMY ABDOMINAL WITH SALPINGECTOMY;  Surgeon: Wendelyn Halter, MD;  Location: [facility];  Service: Gynecology;  Laterality: Bilateral;   HYSTEROSCOPY N/A 09/17/2020   Procedure: HYSTEROSCOPY;  Surgeon: Wendelyn Halter, MD;  Location: [facility];  Service: Gynecology;  Laterality: N/A;   HYSTEROSCOPY WITH D & C N/A 09/27/2013   Procedure: DILATATION AND CURETTAGE /HYSTEROSCOPY and insertion of Mirena  IUD ;  Surgeon: Elridge Haller. Avanell Bob, MD;  Location: [facility];  Service: Gynecology;  Laterality: N/A;   IUD REMOVAL N/A 09/17/2020   Procedure: INTRAUTERINE DEVICE (IUD) REMOVAL (2);  Surgeon: Wendelyn Halter, MD;  Location: [facility];  Service: Gynecology;  Laterality: N/A;   LAPAROSCOPY N/A 01/19/2023   Procedure: LAPAROSCOPY DIAGNOSTIC;  Surgeon: Wendelyn Halter, MD;  Location: [facility];  Service: Gynecology;  Laterality: N/A;   MYOMECTOMY  02/03/2011   Procedure: MYOMECTOMY;  Surgeon: Wendelyn Halter, MD;  Location: [facility];  Service: Gynecology;  Laterality: N/A;   SVT ABLATION N/A 11/08/2018   Procedure: SVT ABLATION;  Surgeon: Tammie Fall, MD;  Location: [facility] INVASIVE CV LAB;  Service: Cardiovascular;  Laterality: N/A;   SVT ABLATION N/A 12/26/2020  Procedure: SVT ABLATION;  Surgeon: Tammie Fall, MD;  Location: [facility]  INVASIVE CV LAB;  Service: Cardiovascular;  Laterality: N/A;   TONSILLECTOMY     TYMPANOSTOMY TUBE PLACEMENT        ROS    Objective:     BP 138/81   Pulse 81   Ht 5\' 1"  (1.549 m)   Wt (!) 345 lb 0.6 oz (156.5 kg)   LMP 12/26/2022 (Approximate)   SpO2 98%   BMI 65.19 kg/m  BP Readings from Last 3 Encounters:  10/05/23 138/81  09/20/23 122/75  08/05/23 (!) 141/84   Wt Readings from Last 3 Encounters:  10/05/23 (!) 345 lb 0.6 oz (156.5 kg)  09/20/23 (!) 350 lb 14.4 oz (159.2 kg)  08/05/23 (!) 348 lb 6.4 oz (158 kg)      Physical Exam   No results found for any visits on 10/05/23.  Last CBC Lab Results  Component Value Date   WBC 6.5 09/20/2023   HGB 11.2 (L) 09/20/2023   HCT 36.8 09/20/2023   MCV 85.4 09/20/2023   MCH 26.0 09/20/2023   RDW 15.4 09/20/2023   PLT 316 09/20/2023   Last metabolic panel Lab Results  Component Value Date   GLUCOSE 86 09/20/2023   NA 132 (L) 09/20/2023   K 3.9 09/20/2023   CL 98 09/20/2023   CO2 26 09/20/2023   BUN 10 09/20/2023   CREATININE 0.58 09/20/2023   GFRNONAA >60 09/20/2023   CALCIUM 8.9 09/20/2023   PROT 7.2 09/20/2023   ALBUMIN 3.5 09/20/2023   LABGLOB 2.8 06/17/2023   AGRATIO 1.3 10/27/2021   BILITOT 0.3 09/20/2023   ALKPHOS 61 09/20/2023   AST 11 (L) 09/20/2023   ALT 14 09/20/2023   ANIONGAP 8 09/20/2023   Last hemoglobin A1c Lab Results  Component Value Date   HGBA1C 5.9 (H) 06/17/2023   Last thyroid  functions Lab Results  Component Value Date   TSH 2.460 06/17/2023      The ASCVD Risk score (Arnett DK, et al., 2019) failed to calculate for the following reasons:   The 2019 ASCVD risk score is only valid for ages 65 to 83    Assessment & Plan:   Problem List Items Addressed This Visit   None   No follow-ups on file.    Alison Irvine, FNP

## 2023-10-05 NOTE — Telephone Encounter (Signed)
 Pharmacy Patient Advocate Encounter   Received notification from CoverMyMeds that prior authorization for Zepbound 2.5MG /0.5ML pen-injectors is required/requested.   Insurance verification completed.   The patient is insured through [facility] .   Per test claim: PA required; PA submitted to above mentioned insurance via CoverMyMeds Key/confirmation #/EOC [facility] Status is pending

## 2023-10-06 ENCOUNTER — Other Ambulatory Visit (HOSPITAL_COMMUNITY): Payer: Self-pay

## 2023-10-06 NOTE — Assessment & Plan Note (Signed)
 She has had minimal weight loss with Wegovy .  She reports that she has been working on [employer] and increasing physical activity but chronic conditions such as asthma and migraines make it difficult for her to exercise very much.  Prescription for Zepbound sent to pharmacy.  Recommend follow-up in 2-3 months to reevaluate.

## 2023-10-06 NOTE — Assessment & Plan Note (Signed)
 Recommend obtaining MRI for further evaluation given increase symptoms and duration of migraine.  Continue with current medications.  Follow-up according to MRI results.

## 2023-10-06 NOTE — Assessment & Plan Note (Signed)
 She is currently on CPAP and using consistently.  We will try to obtain Zepbound for her given diagnosis of sleep apnea and morbid obesity.

## 2023-10-06 NOTE — Telephone Encounter (Signed)
 Pharmacy Patient Advocate Encounter  Received notification from [facility] that Prior Authorization for Zepbound 2.5MG /0.5ML pen-injectors has been CLOSED.   PA #/Case ID/Reference #: [name]    Patient needs to contact the health plan to inquire about other primary insurance on file. If patient has no other insurance this needs to removed from the plan before they will process the prior authorization.

## 2023-10-11 ENCOUNTER — Ambulatory Visit (HOSPITAL_COMMUNITY)

## 2023-10-13 ENCOUNTER — Ambulatory Visit: Payer: 59 | Admitting: Family Medicine

## 2023-10-14 ENCOUNTER — Telehealth: Payer: Self-pay | Admitting: Family Medicine

## 2023-10-14 DIAGNOSIS — G4733 Obstructive sleep apnea (adult) (pediatric): Secondary | ICD-10-CM | POA: Diagnosis not present

## 2023-10-14 NOTE — Telephone Encounter (Signed)
 Copied from CRM [id number]. Topic: Appointments - Scheduling Inquiry for Clinic >> Oct 14, 2023 12:36 PM Baldemar Lev wrote: Reason for CRM: Melissa from [facility] called following up on missing authorization for patient's MRI scheduled 10/18/2023. Please advise   Best contact: [phone] direct [phone]

## 2023-10-17 ENCOUNTER — Other Ambulatory Visit (HOSPITAL_COMMUNITY): Payer: Self-pay

## 2023-10-17 ENCOUNTER — Telehealth: Payer: Self-pay | Admitting: Family Medicine

## 2023-10-17 ENCOUNTER — Other Ambulatory Visit: Payer: Self-pay

## 2023-10-17 MED ORDER — DIAZEPAM 5 MG PO TABS: 5.0000 mg | ORAL_TABLET | Freq: Once | ORAL | 0 refills | Status: AC | PRN

## 2023-10-17 NOTE — Progress Notes (Signed)
 Comprehensive Clinical Assessment (CCA) Note  08/17/2023 MAHOGANI HOLOHAN [id number]  Chief Complaint: Depressed  Visit Diagnosis: Major Moderate Depression   CCA Screening, Triage and Referral (STR)  Patient Reported Information How did you hear about us ? Friend Referral name: Shona Bachelor Referral phone number: [phone]  Whom do you see for routine medical problems? Gloria Zarwolo Practice/Facility Name: Family Medicine Practice/Facility Phone Number: [phone]  Name of Contact: Gloria Zarwolo Contact Number: [phone] Contact Fax Number: No data recorded Prescriber Name: Gloria Zarwolo  Prescriber Address (if known): [address], [place] [address]  What Is the Reason for Your Visit/Call Today? Therapy How Long Has This Been Causing You Problems? months What Do You Feel Would Help You the Most Today? Talk it out, set goals  Have You Recently Been in Any Inpatient Treatment (Hospital/Detox/Crisis Center/28-Day Program)? No Name/Location of Program/Hospital:N/A How Long Were You There? N/A When Were You Discharged? N/A  Have You Ever Received Services From [employer] [facility] Before? Yes Who Do You See at [facility]? Emergency services and specialists  Have You Recently Had Any Thoughts About Hurting Yourself? No Are You Planning to Commit Suicide/Harm Yourself At This time? No  Have you Recently Had Thoughts About Hurting Someone Sher[place]? No Explanation: N/A  Have You Used Any Alcohol or Drugs in the Past 24 Hours? No How Long Ago Did You Use Drugs or Alcohol? Only drink on special occasions What Did You Use and How Much? Alcohol   Do You Currently Have a Therapist/Psychiatrist? Yes, therapist Name of Therapist/Psychiatrist: Camie Almarie Higinio [place], LCSWA  Have You Been Recently Discharged From Any Office Practice or Programs? No Explanation of Discharge From Practice/Program: N/A    CCA Screening Triage Referral Assessment Type of Contact: In  person Is this Initial or Reassessment? Initial  Date Telepsych consult ordered in [facility]:  08/17/2023  Time Telepsych consult ordered in [facility]:  12 pm  Patient Reported Information Reviewed? Yes Patient Left Without Being Seen? No Reason for Not Completing Assessment: Completed  Collateral Involvement: No  Does Patient Have a Court Appointed Legal Guardian? No Name and Contact of Legal Guardian: N/A If Minor and Not Living with Parent(s), Who has Custody? N/A Is CPS involved or ever been involved? N/A Is APS involved or ever been involved? No  Patient Determined To Be At Risk for Harm To Self or Others Based on Review of Patient Reported Information or Presenting Complaint? No Method: N/A Availability of Means: N/A Intent: N/A Notification Required: N/A Additional Information for Danger to Others Potential: None Additional Comments for Danger to Others Potential: N/A Are There Guns or Other Weapons in Your Home? N/A Types of Guns/Weapons: N/A Are These Weapons Safely Secured? N/A                            Who Could Verify You Are Able To Have These Secured: N/A Do You Have any Outstanding Charges, Pending Court Dates, Parole/Probation? No Contacted To Inform of Risk of Harm To Self or Others: No  Location of Assessment: [facility]  Does Patient Present under Involuntary Commitment? No IVC Papers Initial File Date: N/A  [place] of Residence: [place]  Patient Currently Receiving the Following Services: Therapy  Determination of Need: None   Options For Referral: None at this time    CCA Biopsychosocial Intake/Chief Complaint:  Major Moderate Depression Current Symptoms/Problems: Major Moderate Depression  Patient Reported Schizophrenia/Schizoaffective Diagnosis in Past: No  Strengths: Resilient, motivated,  caring, kind, resourceful Preferences: stability, patience, understanding Abilities: effective communicator, problem solver, motivator, team player  Type of Services  Patient Feels are Needed: Therapy  Initial Clinical Notes/Concerns: Missey is kind. She is motivated to get things done. She has been through a lot and keeps pushing. We are working on her past trauma and what she is going through daily. She has depression but always pushes through.   Mental Health Symptoms Depression:  Yes  Duration of Depressive symptoms: years  Mania:  None  Anxiety:   Yes at times  Psychosis:  None  Duration of Psychotic symptoms: N/A  Trauma:  Yes   Obsessions:  None  Compulsions:  None  Inattention:  No  Hyperactivity/Impulsivity:  No  Oppositional/Defiant Behaviors:  No  Emotional Irregularity:  No  Other Mood/Personality Symptoms: No   Mental Status Exam Appearance and self-care  Stature:  5 foot 1 inch  Weight:  345 lbs.  Clothing:  Clean  Grooming:  Well groomed  Cosmetic use:  None  Posture/gait:  Normal range  Motor activity:  Normal range  Sensorium  Attention:  Normal range  Concentration:  Normal range  Orientation:  A + O  Recall/memory:  Normal range  Affect and Mood  Affect:  Euthymic   Mood:  Pleasant  Relating  Eye contact:  Normal range  Facial expression:  Normal range  Attitude toward examiner:  Pleasant  Thought and Language  Speech flow: Normal range  Thought content:  Normal range  Preoccupation:  Normal range  Hallucinations:  None  Organization:  Normal range  [employer] Knowledge:  Normal range  Intelligence:  Normal range  Abstraction:  Normal range  Judgement:  Normal range  Reality Testing:  Normal range  Insight:  Normal range  Decision Making:  Normal range  Social Functioning  Social Maturity:  Normal range  Social Judgement:  Normal range  Stress  Stressors:  Actuary, family  Coping Ability:  Normal range  Skill Deficits:  confidence  Supports:  Family, friends    Religion: Believes in God   Leisure/Recreation: enjoys time with friends, hanging out, going out to  eat   Exercise/Diet: Limited exercise, poor diet, eats what she can afford    CCA Employment/Education Employment/Work Situation: Quarry manager part-time   Education: [employer] diploma  CCA Family/Childhood History Family and Relationship History: Kayla had a hard childhood. Grandmother and father were very strict and hard on her. She was very sheltered. She has had a series of health issues that have come throughout her life. She had a hysterectomy a few years ago and that took away the ability to have children which was devastating. She has an older sister that she lives with and that is going really well. They support each other. Her younger sister is an addict and they do not have a lot to do with her.    Childhood History:    Child/Adolescent Assessment:     CCA Substance Use Alcohol/Drug Use: None     ASAM's:  Six Dimensions of Multidimensional Assessment  Dimension 1:  Acute Intoxication and/or Withdrawal Potential:      Dimension 2:  Biomedical Conditions and Complications:      Dimension 3:  Emotional, Behavioral, or Cognitive Conditions and Complications:     Dimension 4:  Readiness to Change:     Dimension 5:  Relapse, Continued use, or Continued Problem Potential:     Dimension 6:  Recovery/Living Environment:  ASAM Severity Score:    ASAM Recommended Level of Treatment:     Substance use Disorder (SUD)    Recommendations for Services/Supports/Treatments:    DSM5 Diagnoses: Patient Active Problem List   Diagnosis Date Noted   Acute wrist pain, left 08/05/2023   Idiopathic urticaria 07/22/2023   Seasonal allergic conjunctivitis 07/22/2023   Fibroids 01/26/2023   S/P hysterectomy 01/19/2023   Prediabetes 01/12/2023   Intractable chronic migraine with aura and without status migrainosus 12/20/2022   Iron deficiency anemia due to chronic blood loss 12/20/2022   Essential hypertension 11/01/2022   DUB (dysfunctional uterine bleeding)  11/01/2022   Moderate persistent asthma without complication 10/27/2022   Peripheral neuropathy 09/28/2022   Headache 07/01/2022   Screening for STD (sexually transmitted disease) 03/30/2022   Abdominal pain 03/02/2022   RUQ pain 02/23/2022   Abnormal abdominal CT scan 02/23/2022   Constipation 02/11/2022   Mild persistent asthma without complication 02/03/2022   Chronic urticaria 02/03/2022   Seasonal and perennial allergic rhinitis 02/03/2022   Anaphylactic shock due to adverse food reaction 02/03/2022   Need for immunization against influenza 01/27/2022   OSA (obstructive sleep apnea) 11/23/2021   Otitis media of left ear 10/26/2021   Chronic venous insufficiency of lower extremity 10/26/2021   Allergy  10/26/2021   Encounter for IUD removal    Menorrhagia 09/04/2020   Elevated troponin    Attempted IUD removal, unsuccessful 07/05/2019   Encounter for IUD insertion 07/05/2019   Gastroesophageal reflux disease    Nonspecific chest pain 11/11/2018   Morbid obesity with body mass index (BMI) of 60.0 to 69.9 in adult (HCC) 11/11/2018   Elevated d-dimer 11/11/2018   SVT (supraventricular tachycardia) (HCC) 10/04/2018   IUD (intrauterine device) in place 07/11/2015   Other disorder of menstruation and other abnormal bleeding from female genital tract 10/30/2012    Patient Centered Plan: Patient is on the following Treatment Plan(s):  Major Moderate Depression   Referrals to Alternative Service(s): Referred to Alternative Service(s):   Place:   Date:   Time:    Referred to Alternative Service(s):   Place:   Date:   Time:    Referred to Alternative Service(s):   Place:   Date:   Time:    Referred to Alternative Service(s):   Place:   Date:   Time:      Collaboration of Care: None  Patient/Guardian was advised Release of Information must be obtained prior to any record release in order to collaborate their care with an outside provider. Patient/Guardian was advised if they have  not already done so to contact the registration department to sign all necessary forms in order for us  to release information regarding their care.   Consent: Patient/Guardian gives verbal consent for treatment and assignment of benefits for services provided during this visit. Patient/Guardian expressed understanding and agreed to proceed.   Camie Norris T [place], LCSWA

## 2023-10-17 NOTE — Telephone Encounter (Signed)
 I sent you an inbasket regarding the insurance PA for this Patient.  Please Advise.

## 2023-10-17 NOTE — Telephone Encounter (Signed)
 Copied from CRM [id number]. Topic: Clinical - Medication Question >> Oct 17, 2023  1:32 PM Donee H wrote: Reason for CRM: Patient called stating she was told in prior office visit on June 4 that there would be medication ordered for her prior to her Mri which is scheduled for tomorrow. There is no order for anything and patient is concern. Callback request to number [phone]

## 2023-10-17 NOTE — Telephone Encounter (Signed)
 States it was discussed that something for anxiety would be called in for her to take before her MRI and her MRI is scheduled for tomorrow and she is concerned because nothing is at the pharmacy. Pls advise

## 2023-10-18 ENCOUNTER — Ambulatory Visit (HOSPITAL_COMMUNITY)

## 2023-10-18 ENCOUNTER — Telehealth: Payer: Self-pay

## 2023-10-18 ENCOUNTER — Encounter (HOSPITAL_COMMUNITY): Payer: Self-pay

## 2023-10-18 NOTE — Telephone Encounter (Signed)
 Patient went to have her MRI and it was denied needs more documentation. Needed a reason and update on why needed this MRI to be done. Needs to be submitted within 30 days for have MRI of the Brain. Call patient at [phone]

## 2023-10-19 ENCOUNTER — Ambulatory Visit: Admitting: Nutrition

## 2023-10-20 NOTE — Patient Instructions (Addendum)
 Asthma Continue Trelegy 200-1 puff once a day to prevent cough or wheeze. Continue montelukast  10 mg once a day to prevent cough or wheeze.  Continue Xopenex  2 puffs once every 4-6 hours as needed for cough or wheeze.     Allergic rhinitis Continue allergen avoidance measures directed toward grass pollen, weed pollen, ragweed pollen, tree pollen, mold, dust mite, cat, dog, cockroach, and tobacco as listed below Continue levocetirizine 5 mg once a day as needed for runny nose or itch Continue montelukast  10 mg once a day as listed above to help reduce allergy  symptoms Continue Flonase  2 sprays in each nostril once a day as needed for stuffy nose Consider saline nasal rinses as needed for nasal symptoms. Use this before any medicated nasal sprays for best result Consider allergen immunotherapy if your symptoms are not well controlled with the treatment plan as listed below.  Written information provided at her last visit  Allergic conjunctivitis Continue cromolyn  1-2 drops in each eye 1-4 times a day if needed for red or itchy eyes  Hives (urticaria) Use the fewest amount of medications while remaining hive free Levocetirizine (Xyzal ) 5 mg twice a day and famotidine  (Pepcid ) 20 mg twice a day. If no symptoms for 7-14 days then decrease to. Levocetirizine (Xyzal ) 5 mg twice a day and famotidine  (Pepcid ) 20 mg once a day.  If no symptoms for 7-14 days then decrease to. Levocetirizine (Xyzal ) 5 mg twice a day.  If no symptoms for 7-14 days then decrease to. Levocetirizine (Xyzal ) 5 mg once a day.  May use Benadryl  (diphenhydramine ) as needed for breakthrough hives       If symptoms return, then step up dosage Keep a detailed symptom journal including foods eaten, contact with allergens, medications taken, weather changes.  An order has been placed to help us  evaluate your hives. WE will call you when the results become available Will submit for Xolair  for control of hives  Food  allergy  Continue to avoid peanut and shellfish. In case of an allergic reaction, take Benadryl  50 mg capsules every 6 hours, and if life-threatening symptoms occur, inject with EpiPen  0.3 mg. An order has been placed to help us  evaluate your food allergies. We will call you when the results are available  Call the clinic if this treatment plan is not working well for you.  Follow up in 3 months or sooner if needed.  Reducing Pollen Exposure The American Academy of Allergy , Asthma and Immunology suggests the following steps to reduce your exposure to pollen during allergy  seasons. Do not hang sheets or clothing out to dry; pollen may collect on these items. Do not mow lawns or spend time around freshly cut grass; mowing stirs up pollen. Keep windows closed at night.  Keep car windows closed while driving. Minimize morning activities outdoors, a time when pollen counts are usually at their highest. Stay indoors as much as possible when pollen counts or humidity is high and on windy days when pollen tends to remain in the air longer. Use air conditioning when possible.  Many air conditioners have filters that trap the pollen spores. Use a HEPA room air filter to remove pollen form the indoor air you breathe.   Control of Mold Allergen Mold and fungi can grow on a variety of surfaces provided certain temperature and moisture conditions exist.  Outdoor molds grow on plants, decaying vegetation and soil.  The major outdoor mold, Alternaria and Cladosporium, are found in very high numbers during hot and  dry conditions.  Generally, a late Summer - Fall peak is seen for common outdoor fungal spores.  Rain will temporarily lower outdoor mold spore count, but counts rise rapidly when the rainy period ends.  The most important indoor molds are Aspergillus and Penicillium.  Dark, humid and poorly ventilated basements are ideal sites for mold growth.  The next most common sites of mold growth are the bathroom  and the kitchen.  Outdoor [employer] Use air conditioning and keep windows closed Avoid exposure to decaying vegetation. Avoid leaf raking. Avoid grain handling. Consider wearing a face mask if working in moldy areas.  Indoor Mold Control Maintain humidity below 50%. Clean washable surfaces with 5% bleach solution. Remove sources e.g. Contaminated carpets.   Control of Dog or Cat Allergen Avoidance is the best way to manage a dog or cat allergy . If you have a dog or cat and are allergic to dog or cats, consider removing the dog or cat from the home. If you have a dog or cat but don't want to find it a new home, or if your family wants a pet even though someone in the household is allergic, here are some strategies that may help keep symptoms at bay:  Keep the pet out of your bedroom and restrict it to only a few rooms. Be advised that keeping the dog or cat in only one room will not limit the allergens to that room. Don't pet, hug or kiss the dog or cat; if you do, wash your hands with soap and water . High-efficiency particulate air (HEPA) cleaners run continuously in a bedroom or living room can reduce allergen levels over time. Regular use of a high-efficiency vacuum cleaner or a central vacuum can reduce allergen levels. Giving your dog or cat a bath at least once a week can reduce airborne allergen.   Control of Dust Mite Allergen Dust mites play a major role in allergic asthma and rhinitis. They occur in environments with high humidity wherever human skin is found. Dust mites absorb humidity from the atmosphere (ie, they do not drink) and feed on organic matter (including shed human and animal skin). Dust mites are a microscopic type of insect that you cannot see with the naked eye. High levels of dust mites have been detected from mattresses, pillows, carpets, upholstered furniture, bed covers, clothes, soft toys and any woven material. The principal allergen of the dust mite is  found in its feces. A gram of dust may contain 1,000 mites and 250,000 fecal particles. Mite antigen is easily measured in the air during house cleaning activities. Dust mites do not bite and do not cause harm to humans, other than by triggering allergies/asthma.  Ways to decrease your exposure to dust mites in your home:  1. Encase mattresses, box springs and pillows with a mite-impermeable barrier or cover  2. Wash sheets, blankets and drapes weekly in hot water  (130 F) with detergent and dry them in a dryer on the hot setting.  3. Have the room cleaned frequently with a vacuum cleaner and a damp dust-mop. For carpeting or rugs, vacuuming with a vacuum cleaner equipped with a high-efficiency particulate air (HEPA) filter. The dust mite allergic individual should not be in a room which is being cleaned and should wait 1 hour after cleaning before going into the room.  4. Do not sleep on upholstered furniture (eg, couches).  5. If possible removing carpeting, upholstered furniture and drapery from the home is ideal. Horizontal blinds should  be eliminated in the rooms where the person spends the most time (bedroom, study, television room). Washable vinyl, roller-type shades are optimal.  6. Remove all non-washable stuffed toys from the bedroom. Wash stuffed toys weekly like sheets and blankets above.  7. Reduce indoor humidity to less than 50%. Inexpensive humidity monitors can be purchased at most hardware stores. Do not use a humidifier as can make the problem worse and are not recommended.  Control of Cockroach Allergen Cockroach allergen has been identified as an important cause of acute attacks of asthma, especially in urban settings.  There are fifty-five species of cockroach that exist in the [place] , however only three, the [place], [place] and [place] species produce allergen that can affect patients with Asthma.  Allergens can be obtained from fecal particles, egg casings and  secretions from cockroaches.    Remove food sources. Reduce access to water . Seal access and entry points. Spray runways with 0.5-1% Diazinon or Chlorpyrifos Blow boric acid power under stoves and refrigerator. Place bait stations (hydramethylnon) at feeding sites.

## 2023-10-20 NOTE — Progress Notes (Signed)
   [address] [place]  [place] [address] Dept: [phone]  FOLLOW UP NOTE  Patient ID: Jessica Sanchez, female    DOB: 07/06/1985  Age: 38 y.o. MRN: [id number] Date of Office Visit: 10/21/2023  Assessment  Chief Complaint: No chief complaint on file.  HPI Jessica Sanchez is a 38 year old female who presents to the clinic for follow-up visit.  She was last seen in this clinic on 07/22/2023 by Marinus Sic, FNP, for evaluation of asthma, allergic rhinitis, allergic conjunctivitis, urticaria, and food allergy  to peanuts and shellfish.  She did not get the lab work that was ordered at her last visit.  Her last environmental allergy  testing was on 12/21/2021 and was positive to pollens, mold, dust mites, pets, cockroach, and tobacco.   Discussed the use of AI scribe software for clinical note transcription with the patient, who gave verbal consent to proceed.  History of Present Illness      Drug Allergies:  Allergies  Allergen Reactions   Shellfish Allergy  Shortness Of Breath, Itching and Swelling    Mouth became swollen, throat started itching, and developed shortness of breath   Sulfa  Antibiotics Itching, Shortness Of Breath and Swelling    Mouth became swollen, throat started itching, and developed shortness of breath   Adhesive [Tape] Itching    Depends on the type of medical tape    Physical Exam: LMP 12/26/2022 (Approximate)    Physical Exam  Diagnostics:    Assessment and Plan: No diagnosis found.  No orders of the defined types were placed in this encounter.   There are no Patient Instructions on file for this visit.  No follow-ups on file.    Thank you for the opportunity to care for this patient.  Please do not hesitate to contact me with questions.  Marinus Sic, FNP [facility]

## 2023-10-21 ENCOUNTER — Ambulatory Visit (INDEPENDENT_AMBULATORY_CARE_PROVIDER_SITE_OTHER): Admitting: Family Medicine

## 2023-10-21 ENCOUNTER — Encounter: Payer: Self-pay | Admitting: Family Medicine

## 2023-10-21 ENCOUNTER — Other Ambulatory Visit: Payer: Self-pay

## 2023-10-21 VITALS — BP 130/72 | HR 77 | Temp 97.7°F | Resp 18 | Ht 61.81 in | Wt 350.4 lb

## 2023-10-21 DIAGNOSIS — H1013 Acute atopic conjunctivitis, bilateral: Secondary | ICD-10-CM | POA: Diagnosis not present

## 2023-10-21 DIAGNOSIS — L508 Other urticaria: Secondary | ICD-10-CM | POA: Diagnosis not present

## 2023-10-21 DIAGNOSIS — J4540 Moderate persistent asthma, uncomplicated: Secondary | ICD-10-CM | POA: Diagnosis not present

## 2023-10-21 DIAGNOSIS — J3089 Other allergic rhinitis: Secondary | ICD-10-CM | POA: Diagnosis not present

## 2023-10-21 DIAGNOSIS — T7800XD Anaphylactic reaction due to unspecified food, subsequent encounter: Secondary | ICD-10-CM

## 2023-10-21 DIAGNOSIS — J302 Other seasonal allergic rhinitis: Secondary | ICD-10-CM | POA: Diagnosis not present

## 2023-10-21 DIAGNOSIS — H1010 Acute atopic conjunctivitis, unspecified eye: Secondary | ICD-10-CM

## 2023-10-21 NOTE — Telephone Encounter (Signed)
 Can someone please take care of this- more information is needed from OUR office. PA has already been denied can someone please contact patient's insurance so she can have her MRI done. Thank you

## 2023-10-21 NOTE — Telephone Encounter (Signed)
 Her Insurance is currently requiring a Peer to Peer .  I sent an Inbasket to Provider Huenink giving her the information to be able to call and complete the Peer to Peer :)

## 2023-10-21 NOTE — Telephone Encounter (Signed)
 Patient was just seen on 10/05/2023 and MRI was ordered during visit. Patient is needing prior authorization through her insurance and they are needing more information from the clinic as to why patient needs this done in order to approve it. Please advise Thank you

## 2023-10-24 ENCOUNTER — Ambulatory Visit: Payer: Self-pay

## 2023-10-24 ENCOUNTER — Encounter (HOSPITAL_COMMUNITY): Payer: Self-pay | Admitting: Emergency Medicine

## 2023-10-24 ENCOUNTER — Emergency Department (HOSPITAL_COMMUNITY)
Admission: EM | Admit: 2023-10-24 | Discharge: 2023-10-24 | Disposition: A | Discharge status: Home / Self Care | Attending: Emergency Medicine | Admitting: Emergency Medicine

## 2023-10-24 ENCOUNTER — Emergency Department (HOSPITAL_COMMUNITY)

## 2023-10-24 ENCOUNTER — Other Ambulatory Visit: Payer: Self-pay

## 2023-10-24 ENCOUNTER — Telehealth: Payer: Self-pay | Admitting: *Deleted

## 2023-10-24 DIAGNOSIS — R079 Chest pain, unspecified: Secondary | ICD-10-CM | POA: Diagnosis not present

## 2023-10-24 DIAGNOSIS — Z0389 Encounter for observation for other suspected diseases and conditions ruled out: Secondary | ICD-10-CM | POA: Diagnosis not present

## 2023-10-24 DIAGNOSIS — R0789 Other chest pain: Secondary | ICD-10-CM | POA: Diagnosis not present

## 2023-10-24 DIAGNOSIS — Z7901 Long term (current) use of anticoagulants: Secondary | ICD-10-CM | POA: Insufficient documentation

## 2023-10-24 DIAGNOSIS — R0989 Other specified symptoms and signs involving the circulatory and respiratory systems: Secondary | ICD-10-CM | POA: Diagnosis not present

## 2023-10-24 LAB — CBC
HCT: 36.9 % (ref 36.0–46.0)
Hemoglobin: 11.7 g/dL — ABNORMAL LOW (ref 12.0–15.0)
MCH: 26.6 pg (ref 26.0–34.0)
MCHC: 31.7 g/dL (ref 30.0–36.0)
MCV: 83.9 fL (ref 80.0–100.0)
Platelets: 317 10*3/uL (ref 150–400)
RBC: 4.4 MIL/uL (ref 3.87–5.11)
RDW: 15.6 % — ABNORMAL HIGH (ref 11.5–15.5)
WBC: 5.7 10*3/uL (ref 4.0–10.5)
nRBC: 0 % (ref 0.0–0.2)

## 2023-10-24 LAB — HEPATIC FUNCTION PANEL
ALT: 14 U/L (ref 0–44)
AST: 15 U/L (ref 15–41)
Albumin: 3.9 g/dL (ref 3.5–5.0)
Alkaline Phosphatase: 63 U/L (ref 38–126)
Bilirubin, Direct: 0.1 mg/dL (ref 0.0–0.2)
Indirect Bilirubin: 0.6 mg/dL (ref 0.3–0.9)
Total Bilirubin: 0.7 mg/dL (ref 0.0–1.2)
Total Protein: 7.9 g/dL (ref 6.5–8.1)

## 2023-10-24 LAB — BASIC METABOLIC PANEL WITH GFR
Anion gap: 14 (ref 5–15)
BUN: 10 mg/dL (ref 6–20)
CO2: 20 mmol/L — ABNORMAL LOW (ref 22–32)
Calcium: 9.2 mg/dL (ref 8.9–10.3)
Chloride: 103 mmol/L (ref 98–111)
Creatinine, Ser: 0.66 mg/dL (ref 0.44–1.00)
GFR, Estimated: >60 mL/min (ref >60)
Glucose, Bld: 84 mg/dL (ref 70–99)
Potassium: 4.6 mmol/L (ref 3.5–5.1)
Sodium: 137 mmol/L (ref 135–145)

## 2023-10-24 LAB — D-DIMER, QUANTITATIVE: D-Dimer, Quant: 0.6 ug{FEU}/mL — ABNORMAL HIGH (ref 0.00–0.50)

## 2023-10-24 LAB — BRAIN NATRIURETIC PEPTIDE: B Natriuretic Peptide: 26 pg/mL (ref 0.0–100.0)

## 2023-10-24 LAB — LIPASE, BLOOD: Lipase: 26 U/L (ref 11–51)

## 2023-10-24 LAB — TROPONIN I (HIGH SENSITIVITY)
Troponin I (High Sensitivity): <2 ng/L (ref <18)
Troponin I (High Sensitivity): <2 ng/L (ref <18)

## 2023-10-24 MED ORDER — FAMOTIDINE IN NACL 20-0.9 MG/50ML-% IV SOLN
20.0000 mg | Freq: Once | INTRAVENOUS | Status: AC
  Administered 2023-10-24: 20 mg via INTRAVENOUS
  Filled 2023-10-24: qty 50

## 2023-10-24 MED ORDER — ASPIRIN 81 MG PO CHEW
324.0000 mg | CHEWABLE_TABLET | Freq: Once | ORAL | Status: AC
  Administered 2023-10-24: 324 mg via ORAL
  Filled 2023-10-24: qty 4

## 2023-10-24 MED ORDER — IOHEXOL 350 MG/ML SOLN
75.0000 mL | Freq: Once | INTRAVENOUS | Status: AC | PRN
  Administered 2023-10-24: 75 mL via INTRAVENOUS

## 2023-10-24 MED ORDER — PREDNISONE 20 MG PO TABS: 40.0000 mg | ORAL_TABLET | Freq: Every day | ORAL | 0 refills | Status: DC

## 2023-10-24 MED ORDER — KETOROLAC TROMETHAMINE 15 MG/ML IJ SOLN
15.0000 mg | Freq: Once | INTRAMUSCULAR | Status: AC
  Administered 2023-10-24: 15 mg via INTRAVENOUS
  Filled 2023-10-24: qty 1

## 2023-10-24 MED ORDER — SUCRALFATE 1 G PO TABS
1.0000 g | ORAL_TABLET | Freq: Three times a day (TID) | ORAL | 0 refills | Status: AC
Start: 2023-10-24 — End: ?

## 2023-10-24 MED ORDER — FAMOTIDINE 20 MG PO TABS: 20.0000 mg | ORAL_TABLET | Freq: Two times a day (BID) | ORAL | 0 refills | Status: DC

## 2023-10-24 NOTE — Telephone Encounter (Signed)
-----   Message from Arlean Mutter sent at 10/21/2023  3:27 PM EDT ----- Hi there Flonnie Wierman, Can you please submit for Xolair  for chronic urticaria? Thank you

## 2023-10-24 NOTE — Discharge Instructions (Addendum)
 As discussed, your evaluation today has been largely reassuring.  But, it is important that you monitor your condition carefully, and do not hesitate to return to the ED if you develop new, or concerning changes in your condition.  Otherwise, please follow-up with your physician for appropriate ongoing care.  In particular, discussed your presentation for chest pain, today's evaluation that was reassuring, and your medications that you have started today.

## 2023-10-24 NOTE — ED Triage Notes (Signed)
 Pt reports right sided chest pain with radiation to neck and right arm, with SOB and nausea x 30 mins ago, hx of SVT, HR 91

## 2023-10-24 NOTE — Telephone Encounter (Unsigned)
 FYI Only or Action Required?: {FYI only or action required:32865}  Patient was last seen in primary care on 10/05/2023 by Bevely Doffing, FNP. Called Nurse Triage reporting Chest Pain. Symptoms began {$Time; disease onset (duration):32870}. Interventions attempted: {Primary Care interventions tried:32867}. Symptoms are: {COURSE IMPROVING/WORSENING SX HPI:23693}.  Triage Disposition: Go to ED Now (overriding Call EMS 911 Now)  Patient/caregiver understands and will follow disposition?: Yes, will follow disposition  Copied from CRM [id number]. Topic: Clinical - Red Word Triage >> Oct 24, 2023 12:54 PM Rachelle R wrote: [facility] that prompted transfer to Nurse Triage: Patient states at work and started feeling funny. All of a sudden feeling pain and stiffness in her chest, shoulder and neck on the right side. Checked her heart rate and says is fine but she says she doesn't feel right. Reason for Disposition  [1] Chest pain lasts > 5 minutes AND [2] age > 30 AND [3] one or more cardiac risk factors (e.g., diabetes, high blood pressure, high cholesterol, smoker, or strong family history of heart disease)  Answer Assessment - Initial Assessment Questions 1. LOCATION: Where does it hurt?       R side of chest/neck 2. RADIATION: Does the pain go anywhere else? (e.g., into neck, jaw, arms, back)     Neck, shoulder 3. ONSET: When did the chest pain begin? (Minutes, hours or days)      <30 min ago 4. PATTERN: Does the pain come and go, or has it been constant since it started?  Does it get worse with exertion?      Constant, pt states that she can't turn her neck 5. DURATION: How long does it last (e.g., seconds, minutes, hours)     ongoing 6. SEVERITY: How bad is the pain?  (e.g., Scale 1-10; mild, moderate, or severe)    - MILD (1-3): doesn't interfere with normal activities     - MODERATE (4-7): interferes with normal activities or awakens from sleep    - SEVERE (8-10): excruciating  pain, unable to do any normal activities       10 7. CARDIAC RISK FACTORS: Do you have any history of heart problems or risk factors for heart disease? (e.g., angina, prior heart attack; diabetes, high blood pressure, high cholesterol, smoker, or strong family history of heart disease)     HR issues in the past SVT  8. PULMONARY RISK FACTORS: Do you have any history of lung disease?  (e.g., blood clots in lung, asthma, emphysema, birth control pills)     denies 9. CAUSE: What do you think is causing the chest pain?     Unsure, feel funny 10. OTHER SYMPTOMS: Do you have any other symptoms? (e.g., dizziness, nausea, vomiting, sweating, fever, difficulty breathing, cough)       Feel funny, dizziness, nausea  Protocols used: Chest Pain-A-AH

## 2023-10-24 NOTE — ED Provider Notes (Signed)
 [place] EMERGENCY DEPARTMENT AT [facility] Provider Note   CSN: [id number] Arrival date & time: 10/24/23  1331     Patient presents with: Chest Pain   Jessica Sanchez is a 38 y.o. female.   HPI Obese adult female presents with chest pain.  She notes feeling slightly unwell yesterday, but today, about 1 hour prior to ED arrival, while at work she felt sternal discomfort that has been persistent since onset.  No syncope, mild dyspnea, no nausea, vomiting.  No history of cardiac disease.  Patient does not smoke with any frequency, does not drink with any frequency.    Prior to Admission medications   Medication Sig Start Date End Date Taking? Authorizing Provider  albuterol  (VENTOLIN  HFA) 108 (90 Base) MCG/ACT inhaler Inhale 2 puffs into the lungs every 6 (six) hours as needed. Patient taking differently: Inhale 2 puffs into the lungs every 6 (six) hours as needed for wheezing or shortness of breath. 06/27/23  Yes Leath-Warren, Etta PARAS, NP  amlodipine -olmesartan  (AZOR ) 10-20 MG tablet TAKE ONE TABLET BY MOUTH EVERY DAY 10/04/23  Yes Zarwolo, Gloria, FNP  diazepam  (VALIUM ) 5 MG tablet Take 1 tablet (5 mg total) by mouth once as needed for up to 1 dose for anxiety (take 15-30 minutes before procedure). 10/17/23  Yes Bevely Doffing, FNP  EPINEPHrine  (EPIPEN  2-PAK) 0.3 mg/0.3 mL IJ SOAJ injection Inject 0.3 mg into the muscle as needed for anaphylaxis. 02/16/23  Yes Iva Marty Saltness, MD  famotidine  (PEPCID ) 20 MG tablet Take 1 tablet (20 mg total) by mouth 2 (two) times daily. Take one tablet twice daily for two days 10/24/23  Yes Garrick Charleston, MD  famotidine  (PEPCID ) 40 MG tablet Take 1 tablet (40 mg total) by mouth in the morning and at bedtime. 02/02/23  Yes Iva Marty Saltness, MD  Fluticasone -Umeclidin-Vilant (TRELEGY ELLIPTA ) 200-62.5-25 MCG/ACT AEPB Inhale 1 puff into the lungs daily. 07/22/23  Yes Ambs, Arlean HERO, FNP  gabapentin  (NEURONTIN ) 300 MG capsule Take 1 capsule  (300 mg total) by mouth at bedtime. 06/13/23  Yes Zarwolo, Gloria, FNP  levocetirizine (XYZAL ) 5 MG tablet Take 1 tablet (5 mg total) by mouth 2 (two) times daily as needed for allergies (hives/itching). 07/22/23  Yes Ambs, Arlean HERO, FNP  metFORMIN  (GLUCOPHAGE ) 1000 MG tablet Take 0.5 tablets (500 mg total) by mouth 2 (two) times daily with a meal. 01/11/23  Yes Zarwolo, Gloria, FNP  montelukast  (SINGULAIR ) 10 MG tablet Take 1 tablet (10 mg total) by mouth at bedtime. 07/22/23  Yes Ambs, Arlean HERO, FNP  Multiple Vitamins-Calcium (ONE-A-DAY WOMENS PO) Take 1 tablet by mouth daily.   Yes [provider]  NON FORMULARY Pt uses a cpap nightly   Yes [provider]  ondansetron  (ZOFRAN -ODT) 4 MG disintegrating tablet Take 1 tablet (4 mg total) by mouth every 8 (eight) hours as needed for nausea or vomiting. 09/20/23  Yes Yolande Charleston BROCKS, MD  sucralfate (CARAFATE) 1 g tablet Take 1 tablet (1 g total) by mouth 4 (four) times daily -  with meals and at bedtime. 10/24/23  Yes Garrick Charleston, MD  SUMAtriptan  (IMITREX ) 100 MG tablet TAKE ONE TABLET BY MOUTH EVERY 2 HOURS AS NEEDED FOR MIGRAINE. MAY REPEAT IN 2 HOURS IF HEADACHE PERSISTS OR RECURS 07/28/23  Yes Zarwolo, Gloria, FNP  tirzepatide  (ZEPBOUND ) 2.5 MG/0.5ML injection vial Inject 2.5 mg into the skin once a week. 10/05/23  Yes Bevely Doffing, FNP  triamcinolone  ointment (KENALOG ) 0.1 % Apply twice daily for flare  ups below neck, maximum 10 days. 04/11/23  Yes Patel, Arleta SQUIBB, MD  Vitamin D , Ergocalciferol , (DRISDOL ) 1.25 MG (50000 UNIT) CAPS capsule Take 1 capsule (50,000 Units total) by mouth every 7 (seven) days. 06/27/23  Yes Zarwolo, Gloria, FNP  Blood Glucose Monitoring Suppl DEVI 1 each by Does not apply route in the morning, at noon, and at bedtime. May substitute to any manufacturer covered by patient's insurance. 01/11/23   Zarwolo, Gloria, FNP  apixaban  (ELIQUIS ) 5 MG TABS tablet Take 2 tablets (10mg ) twice daily for 7 days, then 1 tablet  (5mg ) twice daily 04/20/19 04/20/19  Meryle Ip A, PA-C  levonorgestrel  (MIRENA ) 20 MCG/24HR IUD 1 each by Intrauterine route once.   06/25/20  [provider]    Allergies: Shellfish allergy , Sulfa  antibiotics, and Adhesive [tape]    Review of Systems  Updated Vital Signs BP 107/68   Pulse 83   Temp 98 F (36.7 C) (Oral)   Resp 17   Ht 5' 1.75 (1.568 m)   Wt (!) 158.8 kg   LMP 12/26/2022 (Approximate)   SpO2 96%   BMI 64.54 kg/m   Physical Exam Vitals and nursing note reviewed.  Constitutional:      General: She is not in acute distress.    Appearance: She is well-developed. She is obese.  HENT:     Head: Normocephalic and atraumatic.   Eyes:     Conjunctiva/sclera: Conjunctivae normal.    Cardiovascular:     Rate and Rhythm: Normal rate and regular rhythm.  Pulmonary:     Effort: Pulmonary effort is normal. No respiratory distress.     Breath sounds: Normal breath sounds. No stridor.  Abdominal:     General: There is no distension.   Skin:    General: Skin is warm and dry.   Neurological:     Mental Status: She is alert and oriented to person, place, and time.     Cranial Nerves: No cranial nerve deficit.   Psychiatric:        Mood and Affect: Mood normal.     (all labs ordered are listed, but only abnormal results are displayed) Labs Reviewed  BASIC METABOLIC PANEL WITH GFR - Abnormal; Notable for the following components:      Result Value   CO2 20 (*)    All other components within normal limits  CBC - Abnormal; Notable for the following components:   Hemoglobin 11.7 (*)    RDW 15.6 (*)    All other components within normal limits  D-DIMER, QUANTITATIVE - Abnormal; Notable for the following components:   D-Dimer, Quant 0.60 (*)    All other components within normal limits  HEPATIC FUNCTION PANEL  LIPASE, BLOOD  BRAIN NATRIURETIC PEPTIDE  POC URINE PREG, ED  TROPONIN I (HIGH SENSITIVITY)  TROPONIN I (HIGH SENSITIVITY)     EKG: EKG Interpretation Date/Time:  Monday October 24 2023 13:43:56 EDT Ventricular Rate:  87 PR Interval:  152 QRS Duration:  86 QT Interval:  364 QTC Calculation: 438 R Axis:   -3  Text Interpretation: Normal sinus rhythm Nonspecific T wave abnormality Abnormal ECG Confirmed by Garrick Charleston [phone]) on 10/24/2023 1:46:25 PM  Radiology: CT Angio Chest PE W and/or Wo Contrast Result Date: 10/24/2023 CLINICAL DATA:  Pulmonary embolism (PE) suspected, low to intermediate prob, positive D-dimer. EXAM: CT ANGIOGRAPHY CHEST WITH CONTRAST TECHNIQUE: Multidetector CT imaging of the chest was performed using the standard protocol during bolus administration of intravenous contrast. Multiplanar CT image reconstructions  and MIPs were obtained to evaluate the vascular anatomy. RADIATION DOSE REDUCTION: This exam was performed according to the departmental dose-optimization program which includes automated exposure control, adjustment of the mA and/or kV according to patient size and/or use of iterative reconstruction technique. CONTRAST:  75mL OMNIPAQUE  IOHEXOL  350 MG/ML SOLN COMPARISON:  CT Angiography chest from 02/01/2023. FINDINGS: Cardiovascular: Evaluation of pulmonary embolism beyond the lobar branches is limited due to suboptimal contrast-enhancement. There is no embolism to the lobar pulmonary artery level. Consider repeat examination, as clinically warranted. Top normal cardiac size. No pericardial effusion. No aortic aneurysm. Mediastinum/Nodes: Visualized thyroid  gland appears grossly unremarkable. No solid / cystic mediastinal masses. The esophagus is nondistended precluding optimal assessment. No axillary, mediastinal or hilar lymphadenopathy by size criteria. Lungs/Pleura: The central tracheo-bronchial tree is patent. There are patchy areas of linear, plate-like atelectasis and/or scarring throughout bilateral lungs. No mass or consolidation. No pleural effusion or pneumothorax. No suspicious  lung nodules. Upper Abdomen: Visualized upper abdominal viscera within normal limits. Musculoskeletal: The visualized soft tissues of the chest wall are grossly unremarkable. No suspicious osseous lesions. There are mild multilevel degenerative changes in the visualized spine. Review of the MIP images confirms the above findings. IMPRESSION: *Evaluation of pulmonary embolism beyond the lobar branches is limited due to suboptimal contrast-enhancement. There is no embolism to the lobar pulmonary artery level. Consider repeat examination, as clinically warranted. Electronically Signed   By: Ree Molt M.D.   On: 10/24/2023 16:31   DG Chest 2 View Result Date: 10/24/2023 CLINICAL DATA:  Chest pain. EXAM: CHEST - 2 VIEW COMPARISON:  06/30/2023. FINDINGS: Redemonstration of mild pulmonary vascular congestion, grossly similar to the prior study. No frank pulmonary edema. Bilateral lung fields are otherwise clear. No acute consolidation or lung collapse. Bilateral costophrenic angles are clear. Normal cardio-mediastinal silhouette. No acute osseous abnormalities. The soft tissues are within normal limits. IMPRESSION: Redemonstration of mild pulmonary vascular congestion, grossly similar to the prior study. No frank pulmonary edema. Electronically Signed   By: Ree Molt M.D.   On: 10/24/2023 14:15     Procedures   Medications Ordered in the ED  aspirin  chewable tablet 324 mg (324 mg Oral Given 10/24/23 1450)  famotidine  (PEPCID ) IVPB 20 mg premix (20 mg Intravenous New Bag/Given 10/24/23 1654)  iohexol  (OMNIPAQUE ) 350 MG/ML injection 75 mL (75 mLs Intravenous Contrast Given 10/24/23 1616)                                    Medical Decision Making Obese adult female presents with sternal chest pain.  Broad differential including ACS, pancreatitis, hepatobiliary or gastric etiology.  Patient is awake, alert, neurologically intact, low suspicion for dissection.  Cardiac 90 sinus normal pulse ox 99% room  air normal  Amount and/or Complexity of Data Reviewed Independent Historian: friend Labs: ordered. Decision-making details documented in ED Course. Radiology: ordered and independent interpretation performed. Decision-making details documented in ED Course. ECG/medicine tests: ordered and independent interpretation performed. Decision-making details documented in ED Course.  Risk OTC drugs. Prescription drug management. Decision regarding hospitalization. Diagnosis or treatment significantly limited by social determinants of health.   5:57 PM Patient awake, alert, vital signs essentially unchanged.  Patient's labs reassuring, BMP normal, troponin x 2 normal, no evidence for pancreatitis with a normal lipase value.  Patient did have positive D-dimer, and subsequent CT angiography was negative for PE though the study was somewhat limited.  With  reassuring vitals, no hypoxia, no tachypnea, low posttest probability for PE.  Patient has improved somewhat, pain is now minimal compared to severe on arrival, after receiving Pepcid .  Some suspicion for gastroesophageal etiology given these findings.  Patient will start medication, follow-up with primary care.     Final diagnoses:  Atypical chest pain    ED Discharge Orders          Ordered    famotidine  (PEPCID ) 20 MG tablet  2 times daily        10/24/23 1757    sucralfate (CARAFATE) 1 g tablet  3 times daily with meals & bedtime       Note to Pharmacy: Take for one week   10/24/23 1757               Garrick Charleston, MD 10/24/23 1757

## 2023-10-24 NOTE — Telephone Encounter (Signed)
 Called patient and advised she needs to reach out to [employer] plan and advise no other coverage since it is saying she has two other prescription plans. Then she can reach out to me to get Xolair  approved

## 2023-10-25 ENCOUNTER — Ambulatory Visit (INDEPENDENT_AMBULATORY_CARE_PROVIDER_SITE_OTHER): Admitting: Psychology

## 2023-10-25 DIAGNOSIS — F321 Major depressive disorder, single episode, moderate: Secondary | ICD-10-CM | POA: Diagnosis not present

## 2023-10-25 NOTE — Telephone Encounter (Signed)
 Patient advised to go to  ER

## 2023-10-27 ENCOUNTER — Ambulatory Visit: Admitting: Nutrition

## 2023-10-29 LAB — CHRONIC URTICARIA (CU) EVAL
ALT: 11 IU/L (ref 0–32)
Basophils Absolute: 0 10*3/uL (ref 0.0–0.2)
Basos: 0 %
CRP: 19 mg/L — ABNORMAL HIGH (ref 0–10)
EOS (ABSOLUTE): 0.1 10*3/uL (ref 0.0–0.4)
Eos: 2 %
Hematocrit: 37.8 % (ref 34.0–46.6)
Hemoglobin: 11.6 g/dL (ref 11.1–15.9)
Immature Grans (Abs): 0 10*3/uL (ref 0.0–0.1)
Immature Granulocytes: 0 %
Lymphocytes Absolute: 2.1 10*3/uL (ref 0.7–3.1)
Lymphs: 40 %
MCH: 25.9 pg — ABNORMAL LOW (ref 26.6–33.0)
MCHC: 30.7 g/dL — ABNORMAL LOW (ref 31.5–35.7)
MCV: 84 fL (ref 79–97)
Monocytes Absolute: 0.4 10*3/uL (ref 0.1–0.9)
Monocytes: 7 %
Neutrophils Absolute: 2.7 10*3/uL (ref 1.4–7.0)
Neutrophils: 51 %
Platelets: 142 10*3/uL — ABNORMAL LOW (ref 150–450)
Pooled Donor- BAT CU: 3 (ref 0.00–10.60)
RBC: 4.48 x10E6/uL (ref 3.77–5.28)
RDW: 14.7 % (ref 11.7–15.4)
Sed Rate: 48 mm/h — ABNORMAL HIGH (ref 0–32)
TSH: 3.07 u[IU]/mL (ref 0.450–4.500)
Thyroperoxidase Ab SerPl-aCnc: 10 [IU]/mL (ref 0–34)
WBC: 5.3 10*3/uL (ref 3.4–10.8)

## 2023-10-29 LAB — ALPHA-GAL PANEL
Allergen Lamb IgE: <0.1 kU/L
Beef IgE: <0.1 kU/L
IgE (Immunoglobulin E), Serum: 39 [IU]/mL (ref 6–495)
O215-IgE Alpha-Gal: <0.1 kU/L
Pork IgE: <0.1 kU/L

## 2023-10-29 LAB — FOOD ALLERGY PROFILE
Allergen Corn, IgE: <0.1 kU/L
Clam IgE: <0.1 kU/L
Codfish IgE: <0.1 kU/L
Egg White IgE: <0.1 kU/L
Milk IgE: <0.1 kU/L
Peanut IgE: <0.1 kU/L
Scallop IgE: <0.1 kU/L
Sesame Seed IgE: <0.1 kU/L
Shrimp IgE: <0.1 kU/L
Soybean IgE: <0.1 kU/L
Walnut IgE: <0.1 kU/L
Wheat IgE: <0.1 kU/L

## 2023-11-01 ENCOUNTER — Telehealth: Payer: Self-pay | Admitting: Pharmacy Technician

## 2023-11-01 ENCOUNTER — Ambulatory Visit: Payer: Self-pay | Admitting: Family Medicine

## 2023-11-01 ENCOUNTER — Other Ambulatory Visit (HOSPITAL_COMMUNITY): Payer: Self-pay

## 2023-11-01 NOTE — Progress Notes (Signed)
 Can you please let this patient know that her CRP and sed rate were a little elevated- these are a general marker of inflammation. Food panel was negative to the most common foods. Alpha gal panel was normal-looks for red meat allergy . Thyroid  studies were normal. Please send to PCP to keep updated on RBC and platelet activity. Any more hives? Did she start Xolair  yet? Thank you

## 2023-11-01 NOTE — Telephone Encounter (Signed)
 Pharmacy Patient Advocate Encounter   Received notification from Onbase that prior authorization for Zepbound  2.5MG /0.5ML pen-injectors is required/requested.   Insurance verification completed.   The patient is insured through [facility] .   Per test claim: PA required; PA submitted to above mentioned insurance via CoverMyMeds Key/confirmation #/[facility] Status is pending

## 2023-11-02 ENCOUNTER — Other Ambulatory Visit (HOSPITAL_COMMUNITY): Payer: Self-pay

## 2023-11-02 ENCOUNTER — Telehealth: Payer: Self-pay | Admitting: Family Medicine

## 2023-11-02 NOTE — Telephone Encounter (Signed)
 Pharmacy Patient Advocate Encounter  Received notification from [facility] that Prior Authorization for Zepbound  2.5MG /0.5ML pen-injectors has been APPROVED from 11/01/2023 to 05/03/2024. Unable to obtain price due to refill too soon rejection, last fill date 11/02/2023 next available fill date07/23/2025   PA #/Case ID/Reference #: Key: [id number]

## 2023-11-02 NOTE — Telephone Encounter (Signed)
 Copied from CRM [id number]. Topic: General - Other >> Nov 01, 2023  4:59 PM Zebedee SAUNDERS wrote: Reason for CRM: [facility] ph: [phone] fax: [phone] per Harlene requesting Office note for incontinent supplies. Request has been  faxed on June 9th,12th, 20th with no reply. Please respond.

## 2023-11-03 DIAGNOSIS — R32 Unspecified urinary incontinence: Secondary | ICD-10-CM | POA: Insufficient documentation

## 2023-11-03 NOTE — Assessment & Plan Note (Signed)
 The patient reports that symptoms began about one month ago. She is experiencing both urge and stress urinary incontinence and is requesting urinary supplies from [facility]. -She was encouraged to begin nonpharmacological interventions, including: -Pelvic floor (Kegel) exercises to strengthen bladder control -Bladder training by gradually increasing the time between voids -Limiting caffeine and alcohol, which may irritate the bladder -Maintaining a healthy weight to reduce pressure on the bladder -Scheduled toileting to prevent accidents -The patient was advised to follow up if symptoms worsen or do not improve with conservative measures.

## 2023-11-06 ENCOUNTER — Other Ambulatory Visit: Payer: Self-pay | Admitting: Family Medicine

## 2023-11-06 DIAGNOSIS — R7303 Prediabetes: Secondary | ICD-10-CM

## 2023-11-07 ENCOUNTER — Encounter: Payer: Self-pay | Admitting: Family Medicine

## 2023-11-07 ENCOUNTER — Encounter (HOSPITAL_COMMUNITY): Payer: Self-pay

## 2023-11-07 ENCOUNTER — Ambulatory Visit (INDEPENDENT_AMBULATORY_CARE_PROVIDER_SITE_OTHER): Admitting: Family Medicine

## 2023-11-07 ENCOUNTER — Other Ambulatory Visit: Payer: Self-pay

## 2023-11-07 ENCOUNTER — Emergency Department (HOSPITAL_COMMUNITY)
Admission: EM | Admit: 2023-11-07 | Discharge: 2023-11-07 | Disposition: A | Discharge status: Home / Self Care | Attending: Emergency Medicine | Admitting: Emergency Medicine

## 2023-11-07 VITALS — BP 122/51 | HR 88 | Resp 16 | Ht 61.0 in | Wt 349.0 lb

## 2023-11-07 DIAGNOSIS — E7849 Other hyperlipidemia: Secondary | ICD-10-CM | POA: Diagnosis not present

## 2023-11-07 DIAGNOSIS — Z7901 Long term (current) use of anticoagulants: Secondary | ICD-10-CM | POA: Diagnosis not present

## 2023-11-07 DIAGNOSIS — E162 Hypoglycemia, unspecified: Secondary | ICD-10-CM | POA: Diagnosis not present

## 2023-11-07 DIAGNOSIS — R109 Unspecified abdominal pain: Secondary | ICD-10-CM | POA: Diagnosis present

## 2023-11-07 DIAGNOSIS — J45909 Unspecified asthma, uncomplicated: Secondary | ICD-10-CM | POA: Diagnosis not present

## 2023-11-07 DIAGNOSIS — E559 Vitamin D deficiency, unspecified: Secondary | ICD-10-CM

## 2023-11-07 DIAGNOSIS — I10 Essential (primary) hypertension: Secondary | ICD-10-CM

## 2023-11-07 DIAGNOSIS — E119 Type 2 diabetes mellitus without complications: Secondary | ICD-10-CM | POA: Insufficient documentation

## 2023-11-07 DIAGNOSIS — E038 Other specified hypothyroidism: Secondary | ICD-10-CM | POA: Diagnosis not present

## 2023-11-07 DIAGNOSIS — R197 Diarrhea, unspecified: Secondary | ICD-10-CM

## 2023-11-07 DIAGNOSIS — R531 Weakness: Secondary | ICD-10-CM | POA: Insufficient documentation

## 2023-11-07 DIAGNOSIS — Z7984 Long term (current) use of oral hypoglycemic drugs: Secondary | ICD-10-CM | POA: Diagnosis not present

## 2023-11-07 DIAGNOSIS — R5381 Other malaise: Secondary | ICD-10-CM | POA: Insufficient documentation

## 2023-11-07 DIAGNOSIS — R7301 Impaired fasting glucose: Secondary | ICD-10-CM

## 2023-11-07 LAB — CBC WITH DIFFERENTIAL/PLATELET
Abs Immature Granulocytes: 0 K/uL (ref 0.00–0.07)
Basophils Absolute: 0 K/uL (ref 0.0–0.1)
Basophils Relative: 0 %
Eosinophils Absolute: 0 K/uL (ref 0.0–0.5)
Eosinophils Relative: 0 %
HCT: 35.1 % — ABNORMAL LOW (ref 36.0–46.0)
Hemoglobin: 10.9 g/dL — ABNORMAL LOW (ref 12.0–15.0)
Lymphocytes Relative: 37 %
Lymphs Abs: 2.2 K/uL (ref 0.7–4.0)
MCH: 25.8 pg — ABNORMAL LOW (ref 26.0–34.0)
MCHC: 31.1 g/dL (ref 30.0–36.0)
MCV: 83.2 fL (ref 80.0–100.0)
Monocytes Absolute: 0.6 K/uL (ref 0.1–1.0)
Monocytes Relative: 10 %
Neutro Abs: 3.1 K/uL (ref 1.7–7.7)
Neutrophils Relative %: 53 %
Platelets: 343 K/uL (ref 150–400)
RBC: 4.22 MIL/uL (ref 3.87–5.11)
RDW: 15.1 % (ref 11.5–15.5)
WBC: 5.9 K/uL (ref 4.0–10.5)
nRBC: 0 % (ref 0.0–0.2)

## 2023-11-07 LAB — URINALYSIS, W/ REFLEX TO CULTURE (INFECTION SUSPECTED)
Bacteria, UA: NONE SEEN
Bilirubin Urine: NEGATIVE
Glucose, UA: NEGATIVE mg/dL
Ketones, ur: NEGATIVE mg/dL
Leukocytes,Ua: NEGATIVE
Nitrite: NEGATIVE
Protein, ur: NEGATIVE mg/dL
Specific Gravity, Urine: 1.01 (ref 1.005–1.030)
pH: 6 (ref 5.0–8.0)

## 2023-11-07 LAB — COMPREHENSIVE METABOLIC PANEL WITH GFR
ALT: 13 U/L (ref 0–44)
AST: 11 U/L — ABNORMAL LOW (ref 15–41)
Albumin: 3.5 g/dL (ref 3.5–5.0)
Alkaline Phosphatase: 67 U/L (ref 38–126)
Anion gap: 12 (ref 5–15)
BUN: 10 mg/dL (ref 6–20)
CO2: 24 mmol/L (ref 22–32)
Calcium: 9.1 mg/dL (ref 8.9–10.3)
Chloride: 99 mmol/L (ref 98–111)
Creatinine, Ser: 0.64 mg/dL (ref 0.44–1.00)
GFR, Estimated: >60 mL/min (ref >60)
Glucose, Bld: 82 mg/dL (ref 70–99)
Potassium: 4.1 mmol/L (ref 3.5–5.1)
Sodium: 135 mmol/L (ref 135–145)
Total Bilirubin: 0.6 mg/dL (ref 0.0–1.2)
Total Protein: 7.2 g/dL (ref 6.5–8.1)

## 2023-11-07 LAB — CBG MONITORING, ED
Glucose-Capillary: 60 mg/dL — ABNORMAL LOW (ref 70–99)
Glucose-Capillary: 69 mg/dL — ABNORMAL LOW (ref 70–99)

## 2023-11-07 LAB — TSH: TSH: 2.391 u[IU]/mL (ref 0.350–4.500)

## 2023-11-07 MED ORDER — GLUCOSE 40 % PO GEL
1.0000 | Freq: Once | ORAL | Status: AC
  Administered 2023-11-07: 31 g via ORAL
  Filled 2023-11-07: qty 1.21

## 2023-11-07 NOTE — ED Provider Notes (Signed)
 [place] EMERGENCY DEPARTMENT AT [facility] Provider Note   CSN: [id number] Arrival date & time: 11/07/23  1556     Patient presents with: Weakness   Jessica Sanchez is a 38 y.o. female.    Weakness Patient presents with ongoing weakness.  Abdominal pain.  Hypoglycemia.  Seen in office today.  Had plans for blood work.  Also chest pain at times.  Reviewed note and there is plan for blood work and follow-up in 4 months, however patient states she talked to the doctor and said if she was still feeling bad she could come to the ER.  Has had pain for a while now.  Sugar was low here.  States she had eaten today.  Has had a week.  Decreased appetite.  No dysuria.    Past Medical History:  Diagnosis Date   Angio-edema    Anxiety    Arthritis    right shoulder   Asthma    Diabetes mellitus without complication (HCC)    Dyspnea    Dysrhythmia    hx of SVT   Fibroid (bleeding) (uterine)    Fibroid, uterine    H/O metrorrhagia    Hypertension    IUD (intrauterine device) in place 07/11/2015   Menorrhagia    Obesity, Class III, BMI 40-49.9 (morbid obesity) 11/11/2018   Sleep apnea    uses CPAP - setting 11   SVT (supraventricular tachycardia) (HCC)     Prior to Admission medications   Medication Sig Start Date End Date Taking? Authorizing Provider  albuterol  (VENTOLIN  HFA) 108 (90 Base) MCG/ACT inhaler Inhale 2 puffs into the lungs every 6 (six) hours as needed. Patient taking differently: Inhale 2 puffs into the lungs every 6 (six) hours as needed for wheezing or shortness of breath. 06/27/23   Leath-Warren, Etta PARAS, NP  amlodipine -olmesartan  (AZOR ) 10-20 MG tablet TAKE ONE TABLET BY MOUTH EVERY DAY 10/04/23   Zarwolo, Gloria, FNP  Blood Glucose Monitoring Suppl DEVI 1 each by Does not apply route in the morning, at noon, and at bedtime. May substitute to any manufacturer covered by patient's insurance. 01/11/23   Zarwolo, Gloria, FNP  diazepam  (VALIUM ) 5 MG tablet  Take 1 tablet (5 mg total) by mouth once as needed for up to 1 dose for anxiety (take 15-30 minutes before procedure). 10/17/23   Bevely Doffing, FNP  EPINEPHrine  (EPIPEN  2-PAK) 0.3 mg/0.3 mL IJ SOAJ injection Inject 0.3 mg into the muscle as needed for anaphylaxis. 02/16/23   Iva Marty Saltness, MD  famotidine  (PEPCID ) 20 MG tablet Take 1 tablet (20 mg total) by mouth 2 (two) times daily. Take one tablet twice daily for two days 10/24/23   Garrick Charleston, MD  famotidine  (PEPCID ) 40 MG tablet Take 1 tablet (40 mg total) by mouth in the morning and at bedtime. 02/02/23   Iva Marty Saltness, MD  Fluticasone -Umeclidin-Vilant (TRELEGY ELLIPTA ) 200-62.5-25 MCG/ACT AEPB Inhale 1 puff into the lungs daily. 07/22/23   Cari Arlean HERO, FNP  gabapentin  (NEURONTIN ) 300 MG capsule Take 1 capsule (300 mg total) by mouth at bedtime. 06/13/23   Zarwolo, Gloria, FNP  levocetirizine (XYZAL ) 5 MG tablet Take 1 tablet (5 mg total) by mouth 2 (two) times daily as needed for allergies (hives/itching). 07/22/23   Cari Arlean HERO, FNP  metFORMIN  (GLUCOPHAGE ) 1000 MG tablet TAKE ONE-HALF TABLET BY MOUTH TWICE DAILY WITH A MEAL 11/07/23   Zarwolo, Gloria, FNP  montelukast  (SINGULAIR ) 10 MG tablet Take 1 tablet (10 mg total) by  mouth at bedtime. 11/07/23   Cari Arlean HERO, FNP  Multiple Vitamins-Calcium (ONE-A-DAY WOMENS PO) Take 1 tablet by mouth daily.    [provider]  NON FORMULARY Pt uses a cpap nightly    [provider]  ondansetron  (ZOFRAN -ODT) 4 MG disintegrating tablet Take 1 tablet (4 mg total) by mouth every 8 (eight) hours as needed for nausea or vomiting. 09/20/23   Yolande Lamar BROCKS, MD  predniSONE  (DELTASONE ) 20 MG tablet Take 2 tablets (40 mg total) by mouth daily with breakfast. For the next four days Patient not taking: Reported on 11/07/2023 10/24/23   Garrick Lamar, MD  sucralfate  (CARAFATE ) 1 g tablet Take 1 tablet (1 g total) by mouth 4 (four) times daily -  with meals and at bedtime. 10/24/23    Garrick Lamar, MD  SUMAtriptan  (IMITREX ) 100 MG tablet TAKE ONE TABLET BY MOUTH EVERY 2 HOURS AS NEEDED FOR MIGRAINE. MAY REPEAT IN 2 HOURS IF HEADACHE PERSISTS OR RECURS 07/28/23   Zarwolo, Gloria, FNP  tirzepatide  (ZEPBOUND ) 2.5 MG/0.5ML injection vial Inject 2.5 mg into the skin once a week. Patient not taking: Reported on 11/07/2023 10/05/23   Bevely Doffing, FNP  triamcinolone  ointment (KENALOG ) 0.1 % Apply twice daily for flare ups below neck, maximum 10 days. 04/11/23   Tobie Arleta SQUIBB, MD  Vitamin D , Ergocalciferol , (DRISDOL ) 1.25 MG (50000 UNIT) CAPS capsule Take 1 capsule (50,000 Units total) by mouth every 7 (seven) days. 06/27/23   Zarwolo, Gloria, FNP  apixaban  (ELIQUIS ) 5 MG TABS tablet Take 2 tablets (10mg ) twice daily for 7 days, then 1 tablet (5mg ) twice daily 04/20/19 04/20/19  Meryle Ip A, PA-C  levonorgestrel  (MIRENA ) 20 MCG/24HR IUD 1 each by Intrauterine route once.   06/25/20  [provider]    Allergies: Shellfish allergy , Sulfa  antibiotics, and Adhesive [tape]    Review of Systems  Neurological:  Positive for weakness.    Updated Vital Signs BP 123/66   Pulse 80   Temp 98.1 F (36.7 C) (Oral)   Resp 20   LMP 12/26/2022 (Approximate)   SpO2 98%   Physical Exam Vitals and nursing note reviewed.  Constitutional:      Appearance: She is obese.  Abdominal:     Tenderness: There is abdominal tenderness.     Comments: Mild lower abdominal tenderness without rebound or guarding.  No hernia palpated.  Musculoskeletal:        General: No tenderness.  Skin:    Capillary Refill: Capillary refill takes less than 2 seconds.  Neurological:     Mental Status: She is alert and oriented to person, place, and time.     (all labs ordered are listed, but only abnormal results are displayed) Labs Reviewed  CBC WITH DIFFERENTIAL/PLATELET - Abnormal; Notable for the following components:      Result Value   Hemoglobin 10.9 (*)    HCT 35.1 (*)    MCH 25.8 (*)     All other components within normal limits  COMPREHENSIVE METABOLIC PANEL WITH GFR - Abnormal; Notable for the following components:   AST 11 (*)    All other components within normal limits  URINALYSIS, W/ REFLEX TO CULTURE (INFECTION SUSPECTED) - Abnormal; Notable for the following components:   Hgb urine dipstick SMALL (*)    All other components within normal limits  CBG MONITORING, ED - Abnormal; Notable for the following components:   Glucose-Capillary 60 (*)    All other components within normal limits  CBG MONITORING, ED -  Abnormal; Notable for the following components:   Glucose-Capillary 69 (*)    All other components within normal limits  TSH  T4, FREE    EKG: EKG Interpretation Date/Time:  Monday November 07 2023 17:09:01 EDT Ventricular Rate:  82 PR Interval:  157 QRS Duration:  96 QT Interval:  375 QTC Calculation: 438 R Axis:   17  Text Interpretation: Sinus rhythm Borderline T abnormalities, anterior leads Confirmed by Patsey Lot [phone]) on 11/07/2023 5:31:13 PM  Radiology: No results found.   Procedures   Medications Ordered in the ED  dextrose  (GLUTOSE) oral gel 40% (peds > 20kg and adults) (31 g Oral Given 11/07/23 1634)                                    Medical Decision Making Amount and/or Complexity of Data Reviewed Labs: ordered.  Risk OTC drugs.   Patient with weakness.  Has been going for a while now.  States was weak and had some chest pain today so came to the ER.-Seen by PCP earlier.  Workup reassuring.  Sugar have been slightly low upon arrival but has come up.  TSH reassuring.  Doubt cardiac cause.  Appears stable for discharge home to follow-up with PCP.  No clear cause found however.  Reviewed notes and has had recent CTA of the chest and recent CT of the abdomen.     Final diagnoses:  Weakness    ED Discharge Orders     None          Patsey Lot, MD 11/07/23 2303

## 2023-11-07 NOTE — Discharge Instructions (Signed)
 Your workup was overall reassuring today.  No clear cause of the symptoms are found.  Follow-up with your doctor as needed.

## 2023-11-07 NOTE — Patient Instructions (Addendum)
 I appreciate the opportunity to provide care to you today!    Follow up:  4 months  Labs: please stop by the lab during the week to get your blood drawn (CBC, CMP, TSH, Lipid profile, HgA1c, Vit D)  For a Healthier YOU, I Recommend: Reducing your intake of sugar, sodium, carbohydrates, and saturated fats. Increasing your fiber intake by incorporating more whole grains, fruits, and vegetables into your meals. Setting healthy goals with a focus on lowering your consumption of carbs, sugar, and unhealthy fats. Adding variety to your diet by including a wide range of fruits and vegetables. Cutting back on soda and limiting processed foods as much as possible. Staying active: In addition to taking your weight loss medication, aim for at least 150 minutes of moderate-intensity physical activity each week for optimal results.      Please continue to a heart-healthy diet and increase your physical activities. Try to exercise for at least five days a week.    It was a pleasure to see you and I look forward to continuing to work together on your health and well-being. Please do not hesitate to call the office if you need care or have questions about your care.  In case of emergency, please visit the Emergency Department for urgent care, or contact our clinic at [phone] to schedule an appointment. We're here to help you!   Have a wonderful day and week. With Gratitude, Doyl Bitting MSN, FNP-BC

## 2023-11-07 NOTE — Progress Notes (Signed)
              Client name: Jessica Sanchez Therapist name: Camie Norris Higinio [place] SILK Date: 09/09/2023 Time: 60 mins   Data: Client reports that she is doing okay. She reports that she is really proud of herself for doing it on her own but that is also really hard at times. She states that she is trying to work as hard as she can but they are only giving her so many hours. She reports that because of that she has to constantly worry about money. Client reports depressive symptoms are at a 5 and would like to get that to a 2 with the use of interventions.   Assessment: The client is very proud of herself but she is realizing that this is really hard. This is the first time that she has really had to do all of this on her own. She knows that she is capable but things are tight and she is feeling overwhelmed at times. We discussed how she is doing the best that she can and that finding acceptance in that so that she can continue to move forward.   Plan: The client will attend bi-weekly sessions with a goal of attending at least 2 per month. Client will take a moment of mindfulness daily and utilize box breathing as needed to reduce symptoms. Work on healing past trauma by figuring out triggers, to help with symptom reduction, emotional regulation, and processing.

## 2023-11-07 NOTE — Assessment & Plan Note (Addendum)
 Educated the patient on the importance of consistent meals and snacks to maintain stable blood glucose levels. Encourage eating three balanced meals per day, including complex carbohydrates, lean protein, and healthy fats. Recommend keeping quick sources of glucose (e.g., juice, glucose tablets) available in case of symptomatic hypoglycemia.

## 2023-11-07 NOTE — Assessment & Plan Note (Signed)
 The patient was advised to monitor her blood pressure at home, with particular attention to diastolic values. She was instructed to report any symptoms such as dizziness, lightheadedness, or fainting. Adequate hydration was encouraged, and she was advised to consider small increases in sodium intake if her blood pressure remains low and symptomatic. To address the low diastolic readings, the patient is encouraged to take  tablet of amlodipine -olmesartan  10-20 mg daily and follow up for blood pressure reassessment.

## 2023-11-07 NOTE — Progress Notes (Signed)
 Established Patient Office Visit  Subjective:  Patient ID: Jessica Sanchez, female    DOB: May 06, 1985  Age: 38 y.o. MRN: [id number]  CC:  Chief Complaint  Patient presents with   Diabetes    States her blood sugar has been running low for the past few days and states she feels weak   Hypertension    States her blood pressure has been running low for the past few days, mainly the bottom number     HPI Jessica Sanchez is a 38 y.o. female  presents today with a history of prediabetes, obesity, and GERD, and reports recent episodes of hypoglycemia, with blood sugar levels ranging between 60s and 70s. She also notes low diastolic blood pressure. The patient admits to inconsistent meal patterns, typically consuming two boiled eggs in the morning, a sandwich for lunch, and occasionally a salad for dinner. She reports persistent fatigue and a general sense of not feeling well, though she denies fever or vomiting. Over the past 3-4 days, she has experienced diarrhea with 2-3 loose stools daily, without blood or mucus. She states that over-the-counter antidiarrheal medication has provided relief. She also reports adequate hydration.  Past Medical History:  Diagnosis Date   Angio-edema    Anxiety    Arthritis    right shoulder   Asthma    Diabetes mellitus without complication (HCC)    Dyspnea    Dysrhythmia    hx of SVT   Fibroid (bleeding) (uterine)    Fibroid, uterine    H/O metrorrhagia    Hypertension    IUD (intrauterine device) in place 07/11/2015   Menorrhagia    Obesity, Class III, BMI 40-49.9 (morbid obesity) 11/11/2018   Sleep apnea    uses CPAP - setting 11   SVT (supraventricular tachycardia) (HCC)     Past Surgical History:  Procedure Laterality Date   DILATATION AND CURETTAGE/HYSTEROSCOPY WITH MINERVA N/A 12/01/2021   Procedure: DILATATION AND CURETTAGE/HYSTEROSCOPY WITH MINERVA;  Surgeon: Jayne Vonn DEL, MD;  Location: [facility];  Service: Gynecology;  Laterality: N/A;    HYSTERECTOMY ABDOMINAL WITH SALPINGECTOMY Bilateral 01/19/2023   Procedure: HYSTERECTOMY ABDOMINAL WITH SALPINGECTOMY;  Surgeon: Jayne Vonn DEL, MD;  Location: [facility];  Service: Gynecology;  Laterality: Bilateral;   HYSTEROSCOPY N/A 09/17/2020   Procedure: HYSTEROSCOPY;  Surgeon: Jayne Vonn DEL, MD;  Location: [facility];  Service: Gynecology;  Laterality: N/A;   HYSTEROSCOPY WITH D & C N/A 09/27/2013   Procedure: DILATATION AND CURETTAGE /HYSTEROSCOPY and insertion of Mirena  IUD ;  Surgeon: Marjorie DEL. Okey, MD;  Location: [facility];  Service: Gynecology;  Laterality: N/A;   IUD REMOVAL N/A 09/17/2020   Procedure: INTRAUTERINE DEVICE (IUD) REMOVAL (2);  Surgeon: Jayne Vonn DEL, MD;  Location: [facility];  Service: Gynecology;  Laterality: N/A;   LAPAROSCOPY N/A 01/19/2023   Procedure: LAPAROSCOPY DIAGNOSTIC;  Surgeon: Jayne Vonn DEL, MD;  Location: [facility];  Service: Gynecology;  Laterality: N/A;   MYOMECTOMY  02/03/2011   Procedure: MYOMECTOMY;  Surgeon: Vonn DEL Jayne, MD;  Location: [facility];  Service: Gynecology;  Laterality: N/A;   SVT ABLATION N/A 11/08/2018   Procedure: SVT ABLATION;  Surgeon: Waddell Danelle ORN, MD;  Location: [facility];  Service: Cardiovascular;  Laterality: N/A;   SVT ABLATION N/A 12/26/2020   Procedure: SVT ABLATION;  Surgeon: Waddell Danelle ORN, MD;  Location: [facility]VASIVE CV [facility];  Service: Cardiovascular;  Laterality: N/A;   TONSILLECTOMY     TYMPANOSTOMY TUBE PLACEMENT  Family History  Problem Relation Age of Onset   Cirrhosis Mother    Diabetes Mother    Cancer Maternal Grandfather    Hypertension Sister    Diabetes Sister    Anesthesia problems Neg Hx    Hypotension Neg Hx    Malignant hyperthermia Neg Hx    Pseudochol deficiency Neg Hx     Social History   Socioeconomic History   Marital status: Single    Spouse name: Not on file   Number of children: 0   Years of education: Not on file   Highest education level: 12th grade  Occupational History   Not  on file  Tobacco Use   Smoking status: Never   Smokeless tobacco: Never   Tobacco comments:    socially  Vaping Use   Vaping status: Never Used  Substance and Sexual Activity   Alcohol use: Not Currently   Drug use: Not Currently    Types: Marijuana   Sexual activity: Not Currently    Birth control/protection: Surgical    Comment: hyst  Other Topics Concern   Not on file  Social History Narrative   Not on file   Social Drivers of Health   Financial Resource Strain: Low Risk  (05/15/2023)   Overall Financial Resource Strain (CARDIA)    Difficulty of Paying Living Expenses: Not very hard  Food Insecurity: No Food Insecurity (05/15/2023)   Hunger Vital Sign    Worried About Running Out of Food in the Last Year: Never true    Ran Out of Food in the Last Year: Never true  Transportation Needs: No Transportation Needs (05/15/2023)   PRAPARE - Administrator, Civil Service (Medical): No    Lack of Transportation (Non-Medical): No  Physical Activity: Sufficiently Active (05/15/2023)   Exercise Vital Sign    Days of Exercise per Week: 5 days    Minutes of Exercise per Session: 30 min  Stress: Stress Concern Present (05/15/2023)   [employer] of Occupational Health - Occupational Stress Questionnaire    Feeling of Stress : Rather much  Social Connections: Moderately Isolated (05/15/2023)   Social Connection and Isolation Panel    Frequency of Communication with Friends and Family: More than three times a week    Frequency of Social Gatherings with Friends and Family: Once a week    Attends Religious Services: More than 4 times per year    Active Member of [employer] or Organizations: No    Attends Engineer, structural: Not on file    Marital Status: Never married  Intimate Partner Violence: Not At Risk (01/19/2023)   Humiliation, Afraid, Rape, and Kick questionnaire    Fear of Current or Ex-Partner: No    Emotionally Abused: No    Physically Abused: No     Sexually Abused: No    Facility-Administered Medications Prior to Visit  Medication Dose Route Frequency Provider Last Rate Last Admin   omalizumab  (XOLAIR ) prefilled syringe 300 mg  300 mg Subcutaneous Q28 days Iva Marty Saltness, MD   300 mg at 04/11/23 1053   Outpatient Medications Prior to Visit  Medication Sig Dispense Refill   albuterol  (VENTOLIN  HFA) 108 (90 Base) MCG/ACT inhaler Inhale 2 puffs into the lungs every 6 (six) hours as needed. (Patient taking differently: Inhale 2 puffs into the lungs every 6 (six) hours as needed for wheezing or shortness of breath.) 8 g 0   amlodipine -olmesartan  (AZOR ) 10-20 MG tablet TAKE ONE TABLET BY MOUTH EVERY DAY  30 tablet 1   Blood Glucose Monitoring Suppl DEVI 1 each by Does not apply route in the morning, at noon, and at bedtime. May substitute to any manufacturer covered by patient's insurance. 1 each 0   diazepam  (VALIUM ) 5 MG tablet Take 1 tablet (5 mg total) by mouth once as needed for up to 1 dose for anxiety (take 15-30 minutes before procedure). 2 tablet 0   EPINEPHrine  (EPIPEN  2-PAK) 0.3 mg/0.3 mL IJ SOAJ injection Inject 0.3 mg into the muscle as needed for anaphylaxis. 2 each 1   famotidine  (PEPCID ) 20 MG tablet Take 1 tablet (20 mg total) by mouth 2 (two) times daily. Take one tablet twice daily for two days 10 tablet 0   famotidine  (PEPCID ) 40 MG tablet Take 1 tablet (40 mg total) by mouth in the morning and at bedtime. 60 tablet 5   Fluticasone -Umeclidin-Vilant (TRELEGY ELLIPTA ) 200-62.5-25 MCG/ACT AEPB Inhale 1 puff into the lungs daily. 28 each 5   gabapentin  (NEURONTIN ) 300 MG capsule Take 1 capsule (300 mg total) by mouth at bedtime. 90 capsule 3   levocetirizine (XYZAL ) 5 MG tablet Take 1 tablet (5 mg total) by mouth 2 (two) times daily as needed for allergies (hives/itching). 60 tablet 5   metFORMIN  (GLUCOPHAGE ) 1000 MG tablet TAKE ONE-HALF TABLET BY MOUTH TWICE DAILY WITH A MEAL 30 tablet 7   Multiple Vitamins-Calcium  (ONE-A-DAY WOMENS PO) Take 1 tablet by mouth daily.     NON FORMULARY Pt uses a cpap nightly     ondansetron  (ZOFRAN -ODT) 4 MG disintegrating tablet Take 1 tablet (4 mg total) by mouth every 8 (eight) hours as needed for nausea or vomiting. 12 tablet 0   sucralfate  (CARAFATE ) 1 g tablet Take 1 tablet (1 g total) by mouth 4 (four) times daily -  with meals and at bedtime. 28 tablet 0   SUMAtriptan  (IMITREX ) 100 MG tablet TAKE ONE TABLET BY MOUTH EVERY 2 HOURS AS NEEDED FOR MIGRAINE. MAY REPEAT IN 2 HOURS IF HEADACHE PERSISTS OR RECURS 10 tablet 2   triamcinolone  ointment (KENALOG ) 0.1 % Apply twice daily for flare ups below neck, maximum 10 days. 80 g 3   Vitamin D , Ergocalciferol , (DRISDOL ) 1.25 MG (50000 UNIT) CAPS capsule Take 1 capsule (50,000 Units total) by mouth every 7 (seven) days. 20 capsule 1   montelukast  (SINGULAIR ) 10 MG tablet Take 1 tablet (10 mg total) by mouth at bedtime. 30 tablet 2   predniSONE  (DELTASONE ) 20 MG tablet Take 2 tablets (40 mg total) by mouth daily with breakfast. For the next four days (Patient not taking: Reported on 11/07/2023) 8 tablet 0   tirzepatide  (ZEPBOUND ) 2.5 MG/0.5ML injection vial Inject 2.5 mg into the skin once a week. (Patient not taking: Reported on 11/07/2023) 2 mL 1    Allergies  Allergen Reactions   Shellfish Allergy  Shortness Of Breath, Itching and Swelling    Mouth became swollen, throat started itching, and developed shortness of breath   Sulfa  Antibiotics Shortness Of Breath, Itching and Swelling    Mouth became swollen, throat started itching, and developed shortness of breath   Adhesive [Tape] Itching    Depends on the type of medical tape    ROS Review of Systems  Constitutional:  Positive for fatigue. Negative for chills and fever.  Eyes:  Negative for visual disturbance.  Respiratory:  Negative for chest tightness and shortness of breath.   Gastrointestinal:  Positive for diarrhea.  Neurological:  Negative for dizziness and  headaches.  Objective:    Physical Exam HENT:     Head: Normocephalic.     Mouth/Throat:     Mouth: Mucous membranes are moist.  Cardiovascular:     Rate and Rhythm: Normal rate.     Heart sounds: Normal heart sounds.  Pulmonary:     Effort: Pulmonary effort is normal.     Breath sounds: Normal breath sounds.  Neurological:     Mental Status: She is alert.     BP (!) 122/51   Pulse 88   Resp 16   Ht 5' 1 (1.549 m)   Wt (!) 349 lb (158.3 kg)   LMP 12/26/2022 (Approximate)   SpO2 96%   BMI 65.94 kg/m  Wt Readings from Last 3 Encounters:  11/07/23 (!) 349 lb (158.3 kg)  10/24/23 (!) 350 lb (158.8 kg)  10/21/23 (!) 350 lb 6.4 oz (158.9 kg)    Lab Results  Component Value Date   TSH 3.070 10/21/2023   Lab Results  Component Value Date   WBC 5.7 10/24/2023   HGB 11.7 (L) 10/24/2023   HCT 36.9 10/24/2023   MCV 83.9 10/24/2023   PLT 317 10/24/2023   Lab Results  Component Value Date   NA 137 10/24/2023   K 4.6 10/24/2023   CO2 20 (L) 10/24/2023   GLUCOSE 84 10/24/2023   BUN 10 10/24/2023   CREATININE 0.66 10/24/2023   BILITOT 0.7 10/24/2023   ALKPHOS 63 10/24/2023   AST 15 10/24/2023   ALT 14 10/24/2023   PROT 7.9 10/24/2023   ALBUMIN 3.9 10/24/2023   CALCIUM 9.2 10/24/2023   ANIONGAP 14 10/24/2023   EGFR 116 06/17/2023   Lab Results  Component Value Date   CHOL 142 06/17/2023   Lab Results  Component Value Date   HDL 40 06/17/2023   Lab Results  Component Value Date   LDLCALC 92 06/17/2023   Lab Results  Component Value Date   TRIG 44 06/17/2023   Lab Results  Component Value Date   CHOLHDL 3.6 06/17/2023   Lab Results  Component Value Date   HGBA1C 5.9 (H) 06/17/2023      Assessment & Plan:  Essential hypertension Assessment & Plan: The patient was advised to monitor her blood pressure at home, with particular attention to diastolic values. She was instructed to report any symptoms such as dizziness, lightheadedness, or  fainting. Adequate hydration was encouraged, and she was advised to consider small increases in sodium intake if her blood pressure remains low and symptomatic. To address the low diastolic readings, the patient is encouraged to take  tablet of amlodipine -olmesartan  10-20 mg daily and follow up for blood pressure reassessment.      Hypoglycemia Assessment & Plan: Educated the patient on the importance of consistent meals and snacks to maintain stable blood glucose levels. Encourage eating three balanced meals per day, including complex carbohydrates, lean protein, and healthy fats. Recommend keeping quick sources of glucose (e.g., juice, glucose tablets) available in case of symptomatic hypoglycemia.    Diarrhea, unspecified type Assessment & Plan: Advised to continue over-the-counter antidiarrheal medication as needed for symptomatic relief. Encourage BRAT diet (bananas, rice, applesauce, toast) temporarily and avoid greasy, spicy, or high-fiber foods. Monitor for signs of dehydration or worsening symptoms. If diarrhea persists beyond 5-7 days, follow up for stool studies and further evaluation.    IFG (impaired fasting glucose) -     Hemoglobin A1c  Vitamin D  deficiency -     VITAMIN D  25 Hydroxy (Vit-D Deficiency,  Fractures)  TSH (thyroid -stimulating hormone deficiency) -     TSH + free T4  Other hyperlipidemia -     Lipid panel -     CMP14+EGFR -     CBC with Differential/Platelet   Note: This chart has been completed using Engineer, civil (consulting) software, and while attempts have been made to ensure accuracy, certain words and phrases may not be transcribed as intended.   Follow-up: Return in about 4 months (around 03/09/2024).   Tyjon Bowen, FNP

## 2023-11-07 NOTE — ED Triage Notes (Signed)
 Pt arrived via POV following a PCP appointment for concerns of on-going weakness and not feeling well. Pt reports her CBG has been running low as well. Pt repots her PCP wanted her to have fasting blood work performed as well. Pt also endorses intermittent chest pain. CBG in Triage is 60.

## 2023-11-07 NOTE — Assessment & Plan Note (Signed)
 Advised to continue over-the-counter antidiarrheal medication as needed for symptomatic relief. Encourage BRAT diet (bananas, rice, applesauce, toast) temporarily and avoid greasy, spicy, or high-fiber foods. Monitor for signs of dehydration or worsening symptoms. If diarrhea persists beyond 5-7 days, follow up for stool studies and further evaluation.

## 2023-11-08 ENCOUNTER — Encounter: Attending: Family Medicine | Admitting: Nutrition

## 2023-11-08 ENCOUNTER — Encounter: Payer: Self-pay | Admitting: Nutrition

## 2023-11-08 VITALS — Ht 61.0 in | Wt 346.0 lb

## 2023-11-08 DIAGNOSIS — R7303 Prediabetes: Secondary | ICD-10-CM | POA: Insufficient documentation

## 2023-11-08 DIAGNOSIS — Z6841 Body Mass Index (BMI) 40.0 and over, adult: Secondary | ICD-10-CM | POA: Diagnosis not present

## 2023-11-08 LAB — T4, FREE: Free T4: 0.9 ng/dL (ref 0.61–1.12)

## 2023-11-08 NOTE — Patient Instructions (Addendum)
 Breakfast:  2 egg whites, 1/2 banana and 1 slice whole wheat toast L) chicken, fish or [place], 2 low carb vegetables and 2 serving of fruit D) 1 cup beans, 2 cups of low carb vegetables and 1 serving of fruit  Do not skip meals Eat 3 meals per day on time. You need to eat more fruits, vegetables and dried beans to provide the nourishment your body needs and allow the weight loss medication to work for you. Drink 100 oz of water  per day Ask MD about refill on reflux medication and ask about low hemoglobin levels if you need iron pills.Jessica Sanchez

## 2023-11-08 NOTE — Progress Notes (Signed)
 Medical Nutrition Therapy  Appointment Start time: 1430 Appointment End time:  1500 Primary concerns today: Severe obesity , Pre Dm Referral diagnosis: E66.01, R73.03 Preferred learning style: No Preference  Learning readiness: Ready   NUTRITION ASSESSMENT  Obesity , pre diabetes , GLP 1 follow up Went to ER yesterday due to feeling weak and blood sugar low. BS was 60 low yesterday. Stopped Wegovy  1 month ago. Is waiting to see about Zepbound  for weight loss.  Metormin 250 mg BID. FBS range 62-86 mg/dl Last J8R 4.0%. Hemoglobin levels low 10.7 mg/dl. She lost her cousin last week and has had a lot going on lately. Eating 1-2 meals per day inconsistently.  Works at [employer] and sometimes doesn't eat til late when she gets off. Lab work reviewed. Hasn't  had refills on her reflux medication and thinks may have been her chest discomfort No weight loss since last visit. Taking 250 mg BID of Metformin . Suppose to get labs done tomorrow. Limited income for food costs. Gets transportation assistance. To see Training and development officer.  Diet is still insuffient to meet her needs. Side effects by not eating and side effects to GLP 1 may be contributing to her issues. Stressed the need she has to 3 healthy meals to lose weigh with GLP 1.  Went to the [facility] yesterday with her sister to meet with trainer about machines to start using them.    Wt Readings from Last 3 Encounters:  11/07/23 (!) 349 lb (158.3 kg)  10/24/23 (!) 350 lb (158.8 kg)  10/21/23 (!) 350 lb 6.4 oz (158.9 kg)   Ht Readings from Last 3 Encounters:  11/07/23 5' 1 (1.549 m)  10/24/23 5' 1.75 (1.568 m)  10/21/23 5' 1.81 (1.57 m)   There is no height or weight on file to calculate BMI. @BMIFA @ Facility age limit for growth %iles is 20 years. Facility age limit for growth %iles is 20 years.   Clinical    Latest Ref Rng & Units 11/07/2023    8:14 PM 10/24/2023    2:05 PM 10/21/2023    2:30 PM  CMP  Glucose 70 - 99 mg/dL 82   84    BUN 6 - 20 mg/dL 10  10    Creatinine 9.55 - 1.00 mg/dL 9.35  9.33    Sodium 864 - 145 mmol/L 135  137    Potassium 3.5 - 5.1 mmol/L 4.1  4.6    Chloride 98 - 111 mmol/L 99  103    CO2 22 - 32 mmol/L 24  20    Calcium 8.9 - 10.3 mg/dL 9.1  9.2    Total Protein 6.5 - 8.1 g/dL 7.2  7.9    Total Bilirubin 0.0 - 1.2 mg/dL 0.6  0.7    Alkaline Phos 38 - 126 U/L 67  63    AST 15 - 41 U/L 11  15    ALT 0 - 44 U/L 13  14  11     Lipid Panel     Component Value Date/Time   CHOL 142 06/17/2023 1115   TRIG 44 06/17/2023 1115   HDL 40 06/17/2023 1115   CHOLHDL 3.6 06/17/2023 1115   LDLCALC 92 06/17/2023 1115   LABVLDL 10 06/17/2023 1115   Lab Results  Component Value Date   HGBA1C 5.9 (H) 06/17/2023    Medical Hx:  Past Medical History:  Diagnosis Date   Angio-edema    Anxiety    Arthritis    right shoulder  Asthma    Diabetes mellitus without complication (HCC)    Dyspnea    Dysrhythmia    hx of SVT   Fibroid (bleeding) (uterine)    Fibroid, uterine    H/O metrorrhagia    Hypertension    IUD (intrauterine device) in place 07/11/2015   Menorrhagia    Obesity, Class III, BMI 40-49.9 (morbid obesity) 11/11/2018   Sleep apnea    uses CPAP - setting 11   SVT (supraventricular tachycardia) (HCC)     Medications:  Current Outpatient Medications on File Prior to Visit  Medication Sig Dispense Refill   albuterol  (VENTOLIN  HFA) 108 (90 Base) MCG/ACT inhaler Inhale 2 puffs into the lungs every 6 (six) hours as needed. (Patient taking differently: Inhale 2 puffs into the lungs every 6 (six) hours as needed for wheezing or shortness of breath.) 8 g 0   amlodipine -olmesartan  (AZOR ) 10-20 MG tablet TAKE ONE TABLET BY MOUTH EVERY DAY 30 tablet 1   Blood Glucose Monitoring Suppl DEVI 1 each by Does not apply route in the morning, at noon, and at bedtime. May substitute to any manufacturer covered by patient's insurance. 1 each 0   diazepam  (VALIUM ) 5 MG tablet Take 1 tablet (5  mg total) by mouth once as needed for up to 1 dose for anxiety (take 15-30 minutes before procedure). 2 tablet 0   EPINEPHrine  (EPIPEN  2-PAK) 0.3 mg/0.3 mL IJ SOAJ injection Inject 0.3 mg into the muscle as needed for anaphylaxis. 2 each 1   famotidine  (PEPCID ) 20 MG tablet Take 1 tablet (20 mg total) by mouth 2 (two) times daily. Take one tablet twice daily for two days 10 tablet 0   famotidine  (PEPCID ) 40 MG tablet Take 1 tablet (40 mg total) by mouth in the morning and at bedtime. 60 tablet 5   Fluticasone -Umeclidin-Vilant (TRELEGY ELLIPTA ) 200-62.5-25 MCG/ACT AEPB Inhale 1 puff into the lungs daily. 28 each 5   gabapentin  (NEURONTIN ) 300 MG capsule Take 1 capsule (300 mg total) by mouth at bedtime. 90 capsule 3   levocetirizine (XYZAL ) 5 MG tablet Take 1 tablet (5 mg total) by mouth 2 (two) times daily as needed for allergies (hives/itching). 60 tablet 5   metFORMIN  (GLUCOPHAGE ) 1000 MG tablet TAKE ONE-HALF TABLET BY MOUTH TWICE DAILY WITH A MEAL 30 tablet 7   montelukast  (SINGULAIR ) 10 MG tablet Take 1 tablet (10 mg total) by mouth at bedtime. 30 tablet 2   Multiple Vitamins-Calcium (ONE-A-DAY WOMENS PO) Take 1 tablet by mouth daily.     NON FORMULARY Pt uses a cpap nightly     ondansetron  (ZOFRAN -ODT) 4 MG disintegrating tablet Take 1 tablet (4 mg total) by mouth every 8 (eight) hours as needed for nausea or vomiting. 12 tablet 0   predniSONE  (DELTASONE ) 20 MG tablet Take 2 tablets (40 mg total) by mouth daily with breakfast. For the next four days (Patient not taking: Reported on 11/07/2023) 8 tablet 0   sucralfate  (CARAFATE ) 1 g tablet Take 1 tablet (1 g total) by mouth 4 (four) times daily -  with meals and at bedtime. 28 tablet 0   SUMAtriptan  (IMITREX ) 100 MG tablet TAKE ONE TABLET BY MOUTH EVERY 2 HOURS AS NEEDED FOR MIGRAINE. MAY REPEAT IN 2 HOURS IF HEADACHE PERSISTS OR RECURS 10 tablet 2   tirzepatide  (ZEPBOUND ) 2.5 MG/0.5ML injection vial Inject 2.5 mg into the skin once a week. (Patient  not taking: Reported on 11/07/2023) 2 mL 1   triamcinolone  ointment (KENALOG ) 0.1 % Apply twice  daily for flare ups below neck, maximum 10 days. 80 g 3   Vitamin D , Ergocalciferol , (DRISDOL ) 1.25 MG (50000 UNIT) CAPS capsule Take 1 capsule (50,000 Units total) by mouth every 7 (seven) days. 20 capsule 1   [DISCONTINUED] apixaban  (ELIQUIS ) 5 MG TABS tablet Take 2 tablets (10mg ) twice daily for 7 days, then 1 tablet (5mg ) twice daily 20 tablet 0   [DISCONTINUED] levonorgestrel  (MIRENA ) 20 MCG/24HR IUD 1 each by Intrauterine route once.      Current Facility-Administered Medications on File Prior to Visit  Medication Dose Route Frequency Provider Last Rate Last Admin   omalizumab  (XOLAIR ) prefilled syringe 300 mg  300 mg Subcutaneous Q28 days Iva Marty Saltness, MD   300 mg at 04/11/23 1053   There is no height or weight on file to calculate BMI. @BMIFA @ Facility age limit for growth %iles is 20 years. Facility age limit for growth %iles is 20 years.   Labs:  Lab Results  Component Value Date   HGBA1C 5.9 (H) 06/17/2023      Latest Ref Rng & Units 11/07/2023    8:14 PM 10/24/2023    2:05 PM 10/21/2023    2:30 PM  CMP  Glucose 70 - 99 mg/dL 82  84    BUN 6 - 20 mg/dL 10  10    Creatinine 9.55 - 1.00 mg/dL 9.35  9.33    Sodium 864 - 145 mmol/L 135  137    Potassium 3.5 - 5.1 mmol/L 4.1  4.6    Chloride 98 - 111 mmol/L 99  103    CO2 22 - 32 mmol/L 24  20    Calcium 8.9 - 10.3 mg/dL 9.1  9.2    Total Protein 6.5 - 8.1 g/dL 7.2  7.9    Total Bilirubin 0.0 - 1.2 mg/dL 0.6  0.7    Alkaline Phos 38 - 126 U/L 67  63    AST 15 - 41 U/L 11  15    ALT 0 - 44 U/L 13  14  11     Lipid Panel     Component Value Date/Time   CHOL 142 06/17/2023 1115   TRIG 44 06/17/2023 1115   HDL 40 06/17/2023 1115   CHOLHDL 3.6 06/17/2023 1115   LDLCALC 92 06/17/2023 1115   LABVLDL 10 06/17/2023 1115    Notable Signs/Symptoms: Tired. Craves sweets at times. Poor sleep  Lifestyle & Dietary Hx Lives  with her sister in an apartment. Working part time [employer].   Estimated daily fluid intake: 120  oz Supplements: VIt D, MVi , V6 Sleep: Bed: 10-11 pm til 10 am. Has CPAP Stress / self-care: financial Current average weekly physical activity: walks some  24-Hr Dietary Recall B) skipped  today; Usually boiled egg and toast  and 1/2 banana  L) chicken broccoli and corn, water  or green tea with honey OR leftovers, D) chicken and [place] rice, water    Estimated Energy Needs Calories: 1200 Carbohydrate: 135g Protein: 90g Fat: 33g   NUTRITION DIAGNOSIS  NI-1.7 Predicted excessive energy intake As related to High calorie diet.  As evidenced by BMI 66.   NUTRITION INTERVENTION  Nutrition education (E-1) on the following topics:  Nutrition tips for weight loss medications Discussed ways to deal with constipation, diarrhea, nausea and GI side effects. Stressed the need for nutrient dense foods and avoid processed and empty calorie foods.  Goals: Breakfast:  2 egg whites, 1/2 banana and 1 slice whole wheat toast L) chicken, fish  or [place], 2 low carb vegetables and 2 serving of fruit D) 1 cup beans, 2 cups of low carb vegetables and 1 serving of fruit  Do not skip meals Eat 3 meals per day on time. You need to eat more fruits, vegetables and dried beans to provide the nourishment your body needs and allow the weight loss medication to work for you. Drink 100 oz of water  per day Ask MD about refill on reflux medication and ask about low hemoglobin levels if you need iron pills..     Handouts Provided Include  Nutrition tips for weigh loss medications. Iron deficiency anemia handout.   Learning Style & Readiness for Change Teaching method utilized: Visual & Auditory  Demonstrated degree of understanding via: Teach Back  Barriers to learning/adherence to lifestyle change: emotional eating and possible depression   MONITORING & EVALUATION Dietary intake, weekly  physical activity, and weight in 3 month.  Next Steps  Patient is to work on meal planning and getting appointment for a therapist..

## 2023-11-09 ENCOUNTER — Ambulatory Visit (INDEPENDENT_AMBULATORY_CARE_PROVIDER_SITE_OTHER): Admitting: Psychology

## 2023-11-09 ENCOUNTER — Telehealth: Payer: Self-pay | Admitting: *Deleted

## 2023-11-09 DIAGNOSIS — F321 Major depressive disorder, single episode, moderate: Secondary | ICD-10-CM

## 2023-11-09 NOTE — Telephone Encounter (Signed)
 Patient called and l/m that she has straightened out her Ins coverage. Will work on getting her approval with her MCD plan and reach out to patient

## 2023-11-09 NOTE — Telephone Encounter (Signed)
 Ready to fax

## 2023-11-10 ENCOUNTER — Other Ambulatory Visit: Payer: Self-pay

## 2023-11-10 ENCOUNTER — Other Ambulatory Visit (HOSPITAL_COMMUNITY): Payer: Self-pay

## 2023-11-10 MED ORDER — XOLAIR 300 MG/2ML ~~LOC~~ SOSY
300.0000 mg | PREFILLED_SYRINGE | SUBCUTANEOUS | 11 refills | Status: DC
  Filled 2023-11-10: qty 2, 28d supply, fill #0
  Filled 2023-12-16 – 2023-12-19 (×2): qty 2, 28d supply, fill #1
  Filled 2024-01-16: qty 2, 28d supply, fill #2
  Filled 2024-02-08: qty 2, 28d supply, fill #3

## 2023-11-10 NOTE — Telephone Encounter (Signed)
 L/m for patient to contact me so I can advise approval with MCD coverage and Rx to [facility]. She had issue previously with MCD showing she had other coverage

## 2023-11-10 NOTE — Progress Notes (Signed)
 Can you please let her know that she can continue on the high dose antihistamine ladder for allergy  control. She can also consider allergen immunotherapy. Please send written information if interested in AIT. Thank you

## 2023-11-10 NOTE — Progress Notes (Signed)
 Specialty Pharmacy Initiation Note   Jessica Sanchez is a 38 y.o. female who will be followed by the specialty pharmacy service for RxSp Allergy     Review of administration, indication, effectiveness, safety, potential side effects, storage/disposable, and missed dose instructions occurred today for patient's specialty medication(s) Omalizumab  (Xolair )     Patient/Caregiver did not have any additional questions or concerns.   Patient's therapy is appropriate to: Initiate    Goals Addressed             This Visit's Progress    Reduce signs and symptoms       Patient is re-initiating therapy. Patient will maintain adherence. Patient had been on Xolair  previously with reasonable results but has been off for a few months due to insurance issues.          Chanell Nadeau M Gretel Cantu Specialty Pharmacist

## 2023-11-10 NOTE — Progress Notes (Signed)
 Specialty Pharmacy Initial Fill Coordination Note  Jessica Sanchez is a 38 y.o. female contacted today regarding initial fill of specialty medication(s) Omalizumab  (Xolair )   Patient requested Courier to Provider Office   Delivery date: 11/15/23   Verified address: [address] [place] [address]   Medication will be filled on 07/14.   Patient is aware of $4.00 copayment.

## 2023-11-10 NOTE — Telephone Encounter (Signed)
 Spoke to patient and advised Rx to [facility] and will reach out once delivery set to make appt to restart Xolair 

## 2023-11-13 DIAGNOSIS — G4733 Obstructive sleep apnea (adult) (pediatric): Secondary | ICD-10-CM | POA: Diagnosis not present

## 2023-11-14 ENCOUNTER — Other Ambulatory Visit (HOSPITAL_COMMUNITY): Payer: Self-pay

## 2023-11-14 ENCOUNTER — Encounter: Payer: Self-pay | Admitting: Emergency Medicine

## 2023-11-14 ENCOUNTER — Other Ambulatory Visit: Payer: Self-pay

## 2023-11-14 ENCOUNTER — Emergency Department (HOSPITAL_COMMUNITY)

## 2023-11-14 ENCOUNTER — Emergency Department (HOSPITAL_COMMUNITY)
Admission: EM | Admit: 2023-11-14 | Discharge: 2023-11-14 | Disposition: A | Discharge status: Home / Self Care | Attending: Emergency Medicine | Admitting: Emergency Medicine

## 2023-11-14 ENCOUNTER — Ambulatory Visit
Admission: EM | Admit: 2023-11-14 | Discharge: 2023-11-14 | Disposition: A | Discharge status: Home / Self Care | Attending: Nurse Practitioner | Admitting: Nurse Practitioner

## 2023-11-14 ENCOUNTER — Encounter (HOSPITAL_COMMUNITY): Payer: Self-pay

## 2023-11-14 DIAGNOSIS — R1011 Right upper quadrant pain: Secondary | ICD-10-CM | POA: Insufficient documentation

## 2023-11-14 DIAGNOSIS — R1031 Right lower quadrant pain: Secondary | ICD-10-CM | POA: Diagnosis not present

## 2023-11-14 DIAGNOSIS — E119 Type 2 diabetes mellitus without complications: Secondary | ICD-10-CM | POA: Diagnosis not present

## 2023-11-14 DIAGNOSIS — R109 Unspecified abdominal pain: Secondary | ICD-10-CM | POA: Insufficient documentation

## 2023-11-14 DIAGNOSIS — R112 Nausea with vomiting, unspecified: Secondary | ICD-10-CM | POA: Diagnosis not present

## 2023-11-14 DIAGNOSIS — R319 Hematuria, unspecified: Secondary | ICD-10-CM | POA: Diagnosis not present

## 2023-11-14 DIAGNOSIS — Z7984 Long term (current) use of oral hypoglycemic drugs: Secondary | ICD-10-CM | POA: Diagnosis not present

## 2023-11-14 DIAGNOSIS — I10 Essential (primary) hypertension: Secondary | ICD-10-CM | POA: Diagnosis not present

## 2023-11-14 DIAGNOSIS — D649 Anemia, unspecified: Secondary | ICD-10-CM | POA: Diagnosis not present

## 2023-11-14 DIAGNOSIS — N200 Calculus of kidney: Secondary | ICD-10-CM | POA: Diagnosis not present

## 2023-11-14 DIAGNOSIS — K429 Umbilical hernia without obstruction or gangrene: Secondary | ICD-10-CM | POA: Diagnosis not present

## 2023-11-14 LAB — CBC WITH DIFFERENTIAL/PLATELET
Abs Immature Granulocytes: 0.03 K/uL (ref 0.00–0.07)
Basophils Absolute: 0 K/uL (ref 0.0–0.1)
Basophils Relative: 0 %
Eosinophils Absolute: 0.1 K/uL (ref 0.0–0.5)
Eosinophils Relative: 1 %
HCT: 33.8 % — ABNORMAL LOW (ref 36.0–46.0)
Hemoglobin: 10.6 g/dL — ABNORMAL LOW (ref 12.0–15.0)
Immature Granulocytes: 0 %
Lymphocytes Relative: 36 %
Lymphs Abs: 2.4 K/uL (ref 0.7–4.0)
MCH: 26.9 pg (ref 26.0–34.0)
MCHC: 31.4 g/dL (ref 30.0–36.0)
MCV: 85.8 fL (ref 80.0–100.0)
Monocytes Absolute: 0.6 K/uL (ref 0.1–1.0)
Monocytes Relative: 9 %
Neutro Abs: 3.6 K/uL (ref 1.7–7.7)
Neutrophils Relative %: 54 %
Platelets: 302 K/uL (ref 150–400)
RBC: 3.94 MIL/uL (ref 3.87–5.11)
RDW: 15.3 % (ref 11.5–15.5)
WBC: 6.7 K/uL (ref 4.0–10.5)
nRBC: 0 % (ref 0.0–0.2)

## 2023-11-14 LAB — COMPREHENSIVE METABOLIC PANEL WITH GFR
ALT: 14 U/L (ref 0–44)
AST: 14 U/L — ABNORMAL LOW (ref 15–41)
Albumin: 3.4 g/dL — ABNORMAL LOW (ref 3.5–5.0)
Alkaline Phosphatase: 60 U/L (ref 38–126)
Anion gap: 11 (ref 5–15)
BUN: 12 mg/dL (ref 6–20)
CO2: 26 mmol/L (ref 22–32)
Calcium: 9.1 mg/dL (ref 8.9–10.3)
Chloride: 103 mmol/L (ref 98–111)
Creatinine, Ser: 0.76 mg/dL (ref 0.44–1.00)
GFR, Estimated: >60 mL/min (ref >60)
Glucose, Bld: 118 mg/dL — ABNORMAL HIGH (ref 70–99)
Potassium: 3.8 mmol/L (ref 3.5–5.1)
Sodium: 140 mmol/L (ref 135–145)
Total Bilirubin: 0.5 mg/dL (ref 0.0–1.2)
Total Protein: 7.2 g/dL (ref 6.5–8.1)

## 2023-11-14 LAB — POCT URINALYSIS DIP (MANUAL ENTRY)
Bilirubin, UA: NEGATIVE
Glucose, UA: NEGATIVE mg/dL
Ketones, POC UA: NEGATIVE mg/dL
Leukocytes, UA: NEGATIVE
Nitrite, UA: NEGATIVE
Protein Ur, POC: NEGATIVE mg/dL
Spec Grav, UA: >=1.03 — AB (ref 1.010–1.025)
Urobilinogen, UA: 0.2 U/dL
pH, UA: 5.5 (ref 5.0–8.0)

## 2023-11-14 LAB — LIPASE, BLOOD: Lipase: 30 U/L (ref 11–51)

## 2023-11-14 LAB — URINALYSIS, ROUTINE W REFLEX MICROSCOPIC
Bilirubin Urine: NEGATIVE
Glucose, UA: NEGATIVE mg/dL
Ketones, ur: NEGATIVE mg/dL
Leukocytes,Ua: NEGATIVE
Nitrite: NEGATIVE
Protein, ur: NEGATIVE mg/dL
Specific Gravity, Urine: 1.029 (ref 1.005–1.030)
pH: 5 (ref 5.0–8.0)

## 2023-11-14 LAB — HCG, SERUM, QUALITATIVE: Preg, Serum: NEGATIVE

## 2023-11-14 MED ORDER — ONDANSETRON HCL 4 MG/2ML IJ SOLN
4.0000 mg | Freq: Once | INTRAMUSCULAR | Status: AC
  Administered 2023-11-14: 4 mg via INTRAVENOUS
  Filled 2023-11-14: qty 2

## 2023-11-14 MED ORDER — ONDANSETRON 4 MG PO TBDP
4.0000 mg | ORAL_TABLET | Freq: Once | ORAL | Status: AC
  Administered 2023-11-14: 4 mg via ORAL

## 2023-11-14 MED ORDER — KETOROLAC TROMETHAMINE 30 MG/ML IJ SOLN
30.0000 mg | Freq: Once | INTRAMUSCULAR | Status: AC
  Administered 2023-11-14: 30 mg via INTRAMUSCULAR

## 2023-11-14 MED ORDER — IBUPROFEN 800 MG PO TABS: 800.0000 mg | ORAL_TABLET | Freq: Three times a day (TID) | ORAL | 0 refills | Status: DC

## 2023-11-14 MED ORDER — IOHEXOL 300 MG/ML  SOLN
100.0000 mL | Freq: Once | INTRAMUSCULAR | Status: AC | PRN
  Administered 2023-11-14: 100 mL via INTRAVENOUS

## 2023-11-14 MED ORDER — ONDANSETRON HCL 4 MG PO TABS
4.0000 mg | ORAL_TABLET | Freq: Four times a day (QID) | ORAL | 0 refills | Status: DC
Start: 2023-11-14 — End: 2023-12-14

## 2023-11-14 MED ORDER — MORPHINE SULFATE (PF) 4 MG/ML IV SOLN
4.0000 mg | Freq: Once | INTRAVENOUS | Status: AC
  Administered 2023-11-14: 4 mg via INTRAVENOUS
  Filled 2023-11-14: qty 1

## 2023-11-14 MED ORDER — SODIUM CHLORIDE 0.9 % IV BOLUS
1000.0000 mL | Freq: Once | INTRAVENOUS | Status: AC
  Administered 2023-11-14: 1000 mL via INTRAVENOUS

## 2023-11-14 MED ORDER — TAMSULOSIN HCL 0.4 MG PO CAPS: 0.4000 mg | ORAL_CAPSULE | Freq: Every day | ORAL | 0 refills | Status: AC

## 2023-11-14 NOTE — ED Notes (Signed)
 Patient transported to CT

## 2023-11-14 NOTE — ED Triage Notes (Signed)
 Pt reports right side/flank pain x 2 days associated with hematuria. She went to UC today and was diagnosed with kidney stones and told to go to ER if her symptoms worsen. She reports worsening nausea, vomiting and pain.

## 2023-11-14 NOTE — ED Provider Notes (Signed)
 [facility]    CSN: [id number] Arrival date & time: 11/14/23  1419      History   Chief Complaint Chief Complaint  Patient presents with   Hematuria    HPI Jessica Sanchez is a 38 y.o. female.   The history is provided by the patient.   Patient presents with a 1 day history of right flank pain, right sided low back pain, and right lower quadrant abdominal pain.  Patient also complains of nausea.  Rates pain 10/10 at present, describes the pain as sharp.  Patient states that the pain started suddenly 1 day ago.  She denies fever, chills, urinary frequency, urgency, or hesitancy.  States that she did notice blood when she wiped earlier this morning.  Patient denies prior history of kidney stones.  States that she did try over-the-counter analgesics for her symptoms with minimal relief.  Past Medical History:  Diagnosis Date   Angio-edema    Anxiety    Arthritis    right shoulder   Asthma    Diabetes mellitus without complication (HCC)    Dyspnea    Dysrhythmia    hx of SVT   Fibroid (bleeding) (uterine)    Fibroid, uterine    H/O metrorrhagia    Hypertension    IUD (intrauterine device) in place 07/11/2015   Menorrhagia    Obesity, Class III, BMI 40-49.9 (morbid obesity) 11/11/2018   Sleep apnea    uses CPAP - setting 11   SVT (supraventricular tachycardia) [facility])     Patient Active Problem List   Diagnosis Date Noted   Malaise 11/07/2023   Hypoglycemia 11/07/2023   Diarrhea 11/07/2023   Incontinence in female 11/03/2023   Acute wrist pain, left 08/05/2023   Idiopathic urticaria 07/22/2023   Seasonal allergic conjunctivitis 07/22/2023   Fibroids 01/26/2023   S/P hysterectomy 01/19/2023   Prediabetes 01/12/2023   Intractable chronic migraine with aura and without status migrainosus 12/20/2022   Iron deficiency anemia due to chronic blood loss 12/20/2022   Essential hypertension 11/01/2022   DUB (dysfunctional uterine bleeding) 11/01/2022    Moderate persistent asthma without complication 10/27/2022   Peripheral neuropathy 09/28/2022   Headache 07/01/2022   Screening for STD (sexually transmitted disease) 03/30/2022   Abdominal pain 03/02/2022   RUQ pain 02/23/2022   Abnormal abdominal CT scan 02/23/2022   Constipation 02/11/2022   Mild persistent asthma without complication 02/03/2022   Chronic urticaria 02/03/2022   Seasonal and perennial allergic rhinitis 02/03/2022   Anaphylactic shock due to adverse food reaction 02/03/2022   Need for immunization against influenza 01/27/2022   OSA (obstructive sleep apnea) 11/23/2021   Otitis media of left ear 10/26/2021   Chronic venous insufficiency of lower extremity 10/26/2021   Allergy  10/26/2021   Encounter for IUD removal    Menorrhagia 09/04/2020   Elevated troponin    Attempted IUD removal, unsuccessful 07/05/2019   Encounter for IUD insertion 07/05/2019   Gastroesophageal reflux disease    Nonspecific chest pain 11/11/2018   Morbid obesity with body mass index (BMI) of 60.0 to 69.9 in adult [facility]) 11/11/2018   Elevated d-dimer 11/11/2018   SVT (supraventricular tachycardia) (HCC) 10/04/2018   IUD (intrauterine device) in place 07/11/2015   Other disorder of menstruation and other abnormal bleeding from female genital tract 10/30/2012    Past Surgical History:  Procedure Laterality Date   DILATATION AND CURETTAGE/HYSTEROSCOPY WITH MINERVA N/A 12/01/2021   Procedure: DILATATION AND CURETTAGE/HYSTEROSCOPY WITH MINERVA;  Surgeon: Jayne Vonn DEL, MD;  Location: [facility];  Service: Gynecology;  Laterality: N/A;   HYSTERECTOMY ABDOMINAL WITH SALPINGECTOMY Bilateral 01/19/2023   Procedure: HYSTERECTOMY ABDOMINAL WITH SALPINGECTOMY;  Surgeon: Jayne Vonn DEL, MD;  Location: [facility];  Service: Gynecology;  Laterality: Bilateral;   HYSTEROSCOPY N/A 09/17/2020   Procedure: HYSTEROSCOPY;  Surgeon: Jayne Vonn DEL, MD;  Location: [facility];  Service: Gynecology;  Laterality: N/A;    HYSTEROSCOPY WITH D & C N/A 09/27/2013   Procedure: DILATATION AND CURETTAGE /HYSTEROSCOPY and insertion of Mirena  IUD ;  Surgeon: Marjorie DEL. Okey, MD;  Location: [facility];  Service: Gynecology;  Laterality: N/A;   IUD REMOVAL N/A 09/17/2020   Procedure: INTRAUTERINE DEVICE (IUD) REMOVAL (2);  Surgeon: Jayne Vonn DEL, MD;  Location: [facility];  Service: Gynecology;  Laterality: N/A;   LAPAROSCOPY N/A 01/19/2023   Procedure: LAPAROSCOPY DIAGNOSTIC;  Surgeon: Jayne Vonn DEL, MD;  Location: [facility];  Service: Gynecology;  Laterality: N/A;   MYOMECTOMY  02/03/2011   Procedure: MYOMECTOMY;  Surgeon: Vonn DEL Jayne, MD;  Location: [facility];  Service: Gynecology;  Laterality: N/A;   SVT ABLATION N/A 11/08/2018   Procedure: SVT ABLATION;  Surgeon: Waddell Danelle ORN, MD;  Location: [facility] INVASIVE CV LAB;  Service: Cardiovascular;  Laterality: N/A;   SVT ABLATION N/A 12/26/2020   Procedure: SVT ABLATION;  Surgeon: Waddell Danelle ORN, MD;  Location: [facility] INVASIVE CV LAB;  Service: Cardiovascular;  Laterality: N/A;   TONSILLECTOMY     TYMPANOSTOMY TUBE PLACEMENT      OB History     Gravida  1   Para      Term      Preterm      AB  1   Living         SAB  1   IAB      Ectopic      Multiple      Live Births               Home Medications    Prior to Admission medications   Medication Sig Start Date End Date Taking? Authorizing Provider  ibuprofen  (ADVIL ) 800 MG tablet Take 1 tablet (800 mg total) by mouth 3 (three) times daily. 11/14/23  Yes Leath-Warren, Etta PARAS, NP  tamsulosin  (FLOMAX ) 0.4 MG CAPS capsule Take 1 capsule (0.4 mg total) by mouth daily after supper. 11/14/23  Yes Leath-Warren, Etta PARAS, NP  albuterol  (VENTOLIN  HFA) 108 (90 Base) MCG/ACT inhaler Inhale 2 puffs into the lungs every 6 (six) hours as needed. 06/27/23   Leath-Warren, Etta PARAS, NP  amlodipine -olmesartan  (AZOR ) 10-20 MG tablet TAKE ONE TABLET BY MOUTH EVERY DAY 10/04/23   Zarwolo, Gloria, FNP  Blood Glucose Monitoring  Suppl DEVI 1 each by Does not apply route in the morning, at noon, and at bedtime. May substitute to any manufacturer covered by patient's insurance. 01/11/23   Zarwolo, Gloria, FNP  diazepam  (VALIUM ) 5 MG tablet Take 1 tablet (5 mg total) by mouth once as needed for up to 1 dose for anxiety (take 15-30 minutes before procedure). Patient not taking: Reported on 11/08/2023 10/17/23   Bevely Doffing, FNP  EPINEPHrine  (EPIPEN  2-PAK) 0.3 mg/0.3 mL IJ SOAJ injection Inject 0.3 mg into the muscle as needed for anaphylaxis. 02/16/23   Iva Marty Saltness, MD  famotidine  (PEPCID ) 20 MG tablet Take 1 tablet (20 mg total) by mouth 2 (two) times daily. Take one tablet twice daily for two days Patient not taking: Reported on 11/08/2023 10/24/23   Garrick Charleston, MD  famotidine  (PEPCID ) 40 MG tablet Take 1 tablet (40 mg total) by mouth in the morning and at bedtime. Patient not taking: Reported on 11/08/2023 02/02/23   Iva Marty Saltness, MD  Fluticasone -Umeclidin-Vilant (TRELEGY ELLIPTA ) 200-62.5-25 MCG/ACT AEPB Inhale 1 puff into the lungs daily. 07/22/23   Cari Arlean HERO, FNP  gabapentin  (NEURONTIN ) 300 MG capsule Take 1 capsule (300 mg total) by mouth at bedtime. 06/13/23   Zarwolo, Gloria, FNP  levocetirizine (XYZAL ) 5 MG tablet Take 1 tablet (5 mg total) by mouth 2 (two) times daily as needed for allergies (hives/itching). 07/22/23   Ambs, Arlean HERO, FNP  metFORMIN  (GLUCOPHAGE ) 1000 MG tablet TAKE ONE-HALF TABLET BY MOUTH TWICE DAILY WITH A MEAL Patient taking differently: Take 250 mg by mouth 2 (two) times daily. 11/07/23   Zarwolo, Gloria, FNP  montelukast  (SINGULAIR ) 10 MG tablet Take 1 tablet (10 mg total) by mouth at bedtime. Patient not taking: Reported on 11/08/2023 11/07/23   Cari Arlean HERO, FNP  Multiple Vitamins-Calcium (ONE-A-DAY WOMENS PO) Take 1 tablet by mouth daily.    [provider]  NON FORMULARY Pt uses a cpap nightly    [provider]  omalizumab  (XOLAIR ) 300 MG/2  ML prefilled syringe  Inject 300 mg into the skin every 28 (twenty-eight) days. 11/10/23   Iva Marty Saltness, MD  ondansetron  (ZOFRAN -ODT) 4 MG disintegrating tablet Take 1 tablet (4 mg total) by mouth every 8 (eight) hours as needed for nausea or vomiting. Patient not taking: Reported on 11/08/2023 09/20/23   Yolande Lamar BROCKS, MD  predniSONE  (DELTASONE ) 20 MG tablet Take 2 tablets (40 mg total) by mouth daily with breakfast. For the next four days 10/24/23   Garrick Lamar, MD  sucralfate  (CARAFATE ) 1 g tablet Take 1 tablet (1 g total) by mouth 4 (four) times daily -  with meals and at bedtime. Patient not taking: Reported on 11/08/2023 10/24/23   Garrick Lamar, MD  SUMAtriptan  (IMITREX ) 100 MG tablet TAKE ONE TABLET BY MOUTH EVERY 2 HOURS AS NEEDED FOR MIGRAINE. MAY REPEAT IN 2 HOURS IF HEADACHE PERSISTS OR RECURS Patient taking differently: every 2 (two) hours as needed. 07/28/23   Zarwolo, Gloria, FNP  tirzepatide  (ZEPBOUND ) 2.5 MG/0.5ML injection vial Inject 2.5 mg into the skin once a week. Patient taking differently: Inject 2.5 mg into the skin once a week. Hasn't picked it up yet-- will today. 10/05/23   Bevely Doffing, FNP  triamcinolone  ointment (KENALOG ) 0.1 % Apply twice daily for flare ups below neck, maximum 10 days. 04/11/23   Tobie Arleta SQUIBB, MD  Vitamin D , Ergocalciferol , (DRISDOL ) 1.25 MG (50000 UNIT) CAPS capsule Take 1 capsule (50,000 Units total) by mouth every 7 (seven) days. 06/27/23   Zarwolo, Gloria, FNP  apixaban  (ELIQUIS ) 5 MG TABS tablet Take 2 tablets (10mg ) twice daily for 7 days, then 1 tablet (5mg ) twice daily 04/20/19 04/20/19  Meryle Ip A, PA-C  levonorgestrel  (MIRENA ) 20 MCG/24HR IUD 1 each by Intrauterine route once.   06/25/20  [provider]    Family History Family History  Problem Relation Age of Onset   Cirrhosis Mother    Diabetes Mother    Cancer Maternal Grandfather    Hypertension Sister    Diabetes Sister    Anesthesia problems Neg Hx    Hypotension Neg Hx     Malignant hyperthermia Neg Hx    Pseudochol deficiency Neg Hx     Social History Social History   Tobacco Use   Smoking status: Never  Smokeless tobacco: Never   Tobacco comments:    socially  Vaping Use   Vaping status: Never Used  Substance Use Topics   Alcohol use: Not Currently   Drug use: Not Currently    Types: Marijuana     Allergies   Shellfish allergy , Sulfa  antibiotics, and Adhesive [tape]   Review of Systems Review of Systems Per HPI  Physical Exam Triage Vital Signs ED Triage Vitals  Encounter Vitals Group     BP 11/14/23 1447 119/70     Girls Systolic BP Percentile --      Girls Diastolic BP Percentile --      Boys Systolic BP Percentile --      Boys Diastolic BP Percentile --      Pulse Rate 11/14/23 1447 81     Resp 11/14/23 1447 20     Temp 11/14/23 1447 98.3 F (36.8 C)     Temp Source 11/14/23 1447 Oral     SpO2 11/14/23 1447 93 %     Weight --      Height --      Head Circumference --      Peak Flow --      Pain Score 11/14/23 1446 10     Pain Loc --      Pain Education --      Exclude from Growth Chart --    No data found.  Updated Vital Signs BP 119/70 (BP Location: Right Arm)   Pulse 81   Temp 98.3 F (36.8 C) (Oral)   Resp 20   LMP 12/26/2022 (Approximate)   SpO2 93%   Visual Acuity Right Eye Distance:   Left Eye Distance:   Bilateral Distance:    Right Eye Near:   Left Eye Near:    Bilateral Near:     Physical Exam Vitals and nursing note reviewed.  Constitutional:      General: She is not in acute distress.    Appearance: Normal appearance.  HENT:     Head: Normocephalic.  Eyes:     Extraocular Movements: Extraocular movements intact.     Conjunctiva/sclera: Conjunctivae normal.     Pupils: Pupils are equal, round, and reactive to light.  Cardiovascular:     Rate and Rhythm: Normal rate.     Pulses: Normal pulses.  Pulmonary:     Effort: No respiratory distress.     Breath sounds: Normal breath sounds.  No stridor. No wheezing, rhonchi or rales.  Abdominal:     General: Bowel sounds are normal.     Palpations: Abdomen is soft.     Tenderness: There is abdominal tenderness. There is right CVA tenderness.  Musculoskeletal:     Cervical back: Normal range of motion.  Skin:    General: Skin is warm and dry.  Neurological:     General: No focal deficit present.     Mental Status: She is alert and oriented to person, place, and time.  Psychiatric:        Mood and Affect: Mood normal.        Behavior: Behavior normal.      UC Treatments / Results  Labs (all labs ordered are listed, but only abnormal results are displayed) Labs Reviewed  POCT URINALYSIS DIP (MANUAL ENTRY) - Abnormal; Notable for the following components:      Result Value   Clarity, UA cloudy (*)    Spec Grav, UA >=1.030 (*)    Blood, UA moderate (*)    All  other components within normal limits  URINE CULTURE    EKG   Radiology No results found.  Procedures Procedures (including critical care time)  Medications Ordered in UC Medications  ketorolac  (TORADOL ) 30 MG/ML injection 30 mg (30 mg Intramuscular Given 11/14/23 1531)  ondansetron  (ZOFRAN -ODT) disintegrating tablet 4 mg (4 mg Oral Given 11/14/23 1531)    Initial Impression / Assessment and Plan / UC Course  I have reviewed the triage vital signs and the nursing notes.  Pertinent labs & imaging results that were available during my care of the patient were reviewed by me and considered in my medical decision making (see chart for details).  Urinalysis is positive for blood, urine culture is pending..  Patient also complains of right flank pain and right lower quadrant abdominal pain, consistent for a possible kidney stone Toradol  30 mg IM administered for pain with Zofran  4 mg ODT for nausea.  Will treat with tamsulosin  0.4 mg for possible kidney stone and ibuprofen  800 mg for pain.  Urine strainer was provided to the patient.  Supportive care  recommendations were provided discussed with the patient to include increasing her fluid intake, rest, and to monitor for signs of worsening.  Patient was given strict ER follow-up precautions.  Patient advised that Rudy monitor his more in the morning just her basal rate up you may have to change her basal from her certain timeframe I would start with that I would lower that was also I had to do because ago felt more insulin for your today of course you know but I had to p I had to change my atient was in agreement with this plan of care  Final Clinical Impressions(s) / UC Diagnoses   Final diagnoses:  Right flank pain  Kidney stone     Discharge Instructions      You were given an injection of Toradol  30 mg and Zofran  4 mg oral tablet.  Do not take any additional NSAIDs today such as ibuprofen , Aleve , Motrin , or naproxen .  You may take Tylenol  for breakthrough pain or discomfort. Your urine culture is pending.  You will be contacted if the pending test results are abnormal. Take medication as prescribed. Your urinalysis shows that you need to increase your water  intake.  Try to drink at least 8-10 8 ounce glasses of water  while symptoms persist. Use the urine strainer provided today's to strain your urine to determine whether or not you have passed a kidney stone. Develop a toileting schedule that will allow you to urinate at least every 2 hours. If you develop worsening pain, nausea, or develop new symptoms such as fever or chills, please go to the emergency department immediately for further evaluation. Follow-up as needed.     ED Prescriptions     Medication Sig Dispense Auth. Provider   tamsulosin  (FLOMAX ) 0.4 MG CAPS capsule Take 1 capsule (0.4 mg total) by mouth daily after supper. 30 capsule Leath-Warren, Etta PARAS, NP   ibuprofen  (ADVIL ) 800 MG tablet Take 1 tablet (800 mg total) by mouth 3 (three) times daily. 21 tablet Leath-Warren, Etta PARAS, NP      PDMP not  reviewed this encounter.   Gilmer Etta PARAS, NP 11/14/23 1554

## 2023-11-14 NOTE — ED Provider Notes (Signed)
 [place] EMERGENCY DEPARTMENT AT [facility] Provider Note   CSN: [id number] Arrival date & time: 11/14/23  1951     Patient presents with: Flank Pain  HPI Jessica Sanchez is a 38 y.o. female with hypertension, diabetes, uterine fibroids presenting for right sided flank pain.  Started 2 days ago with hematuria.  She also reports nausea and vomiting.  She states she vomited at least 4 times today as well.  She states the pain does radiate into her right upper quadrant at times.  Denies chest pain or shortness of breath.  Denies fever.  Denies painful urination or malodorous urine.  Was seen at urgent care earlier and they advised her to come here for further evaluation.    Flank Pain       Prior to Admission medications   Medication Sig Start Date End Date Taking? Authorizing Provider  ondansetron  (ZOFRAN ) 4 MG tablet Take 1 tablet (4 mg total) by mouth every 6 (six) hours. 11/14/23  Yes Eder Macek K, PA-C  albuterol  (VENTOLIN  HFA) 108 (90 Base) MCG/ACT inhaler Inhale 2 puffs into the lungs every 6 (six) hours as needed. 06/27/23   Leath-Warren, Etta PARAS, NP  amlodipine -olmesartan  (AZOR ) 10-20 MG tablet TAKE ONE TABLET BY MOUTH EVERY DAY 10/04/23   Zarwolo, Gloria, FNP  Blood Glucose Monitoring Suppl DEVI 1 each by Does not apply route in the morning, at noon, and at bedtime. May substitute to any manufacturer covered by patient's insurance. 01/11/23   Zarwolo, Gloria, FNP  diazepam  (VALIUM ) 5 MG tablet Take 1 tablet (5 mg total) by mouth once as needed for up to 1 dose for anxiety (take 15-30 minutes before procedure). Patient not taking: Reported on 11/08/2023 10/17/23   Bevely Doffing, FNP  EPINEPHrine  (EPIPEN  2-PAK) 0.3 mg/0.3 mL IJ SOAJ injection Inject 0.3 mg into the muscle as needed for anaphylaxis. 02/16/23   Iva Marty Saltness, MD  famotidine  (PEPCID ) 20 MG tablet Take 1 tablet (20 mg total) by mouth 2 (two) times daily. Take one tablet twice daily for two  days Patient not taking: Reported on 11/08/2023 10/24/23   Garrick Charleston, MD  famotidine  (PEPCID ) 40 MG tablet Take 1 tablet (40 mg total) by mouth in the morning and at bedtime. Patient not taking: Reported on 11/08/2023 02/02/23   Iva Marty Saltness, MD  Fluticasone -Umeclidin-Vilant (TRELEGY ELLIPTA ) 200-62.5-25 MCG/ACT AEPB Inhale 1 puff into the lungs daily. 07/22/23   Cari Arlean HERO, FNP  gabapentin  (NEURONTIN ) 300 MG capsule Take 1 capsule (300 mg total) by mouth at bedtime. 06/13/23   Zarwolo, Gloria, FNP  ibuprofen  (ADVIL ) 800 MG tablet Take 1 tablet (800 mg total) by mouth 3 (three) times daily. 11/14/23   Leath-Warren, Etta PARAS, NP  levocetirizine (XYZAL ) 5 MG tablet Take 1 tablet (5 mg total) by mouth 2 (two) times daily as needed for allergies (hives/itching). 07/22/23   Cari Arlean HERO, FNP  metFORMIN  (GLUCOPHAGE ) 1000 MG tablet TAKE ONE-HALF TABLET BY MOUTH TWICE DAILY WITH A MEAL Patient taking differently: Take 250 mg by mouth 2 (two) times daily. 11/07/23   Zarwolo, Gloria, FNP  montelukast  (SINGULAIR ) 10 MG tablet Take 1 tablet (10 mg total) by mouth at bedtime. Patient not taking: Reported on 11/08/2023 11/07/23   Cari Arlean HERO, FNP  Multiple Vitamins-Calcium (ONE-A-DAY WOMENS PO) Take 1 tablet by mouth daily.    [provider]  NON FORMULARY Pt uses a cpap nightly    [provider]  omalizumab  (XOLAIR ) 300 MG/2  ML  prefilled syringe Inject 300 mg into the skin every 28 (twenty-eight) days. 11/10/23   Iva Marty Saltness, MD  ondansetron  (ZOFRAN -ODT) 4 MG disintegrating tablet Take 1 tablet (4 mg total) by mouth every 8 (eight) hours as needed for nausea or vomiting. Patient not taking: Reported on 11/08/2023 09/20/23   Yolande Lamar BROCKS, MD  predniSONE  (DELTASONE ) 20 MG tablet Take 2 tablets (40 mg total) by mouth daily with breakfast. For the next four days 10/24/23   Garrick Lamar, MD  sucralfate  (CARAFATE ) 1 g tablet Take 1 tablet (1 g total) by mouth 4 (four) times  daily -  with meals and at bedtime. Patient not taking: Reported on 11/08/2023 10/24/23   Garrick Lamar, MD  SUMAtriptan  (IMITREX ) 100 MG tablet TAKE ONE TABLET BY MOUTH EVERY 2 HOURS AS NEEDED FOR MIGRAINE. MAY REPEAT IN 2 HOURS IF HEADACHE PERSISTS OR RECURS Patient taking differently: every 2 (two) hours as needed. 07/28/23   Zarwolo, Gloria, FNP  tamsulosin  (FLOMAX ) 0.4 MG CAPS capsule Take 1 capsule (0.4 mg total) by mouth daily after supper. 11/14/23   Leath-Warren, Etta PARAS, NP  tirzepatide  (ZEPBOUND ) 2.5 MG/0.5ML injection vial Inject 2.5 mg into the skin once a week. Patient taking differently: Inject 2.5 mg into the skin once a week. Hasn't picked it up yet-- will today. 10/05/23   Bevely Doffing, FNP  triamcinolone  ointment (KENALOG ) 0.1 % Apply twice daily for flare ups below neck, maximum 10 days. 04/11/23   Tobie Arleta SQUIBB, MD  Vitamin D , Ergocalciferol , (DRISDOL ) 1.25 MG (50000 UNIT) CAPS capsule Take 1 capsule (50,000 Units total) by mouth every 7 (seven) days. 06/27/23   Zarwolo, Gloria, FNP  apixaban  (ELIQUIS ) 5 MG TABS tablet Take 2 tablets (10mg ) twice daily for 7 days, then 1 tablet (5mg ) twice daily 04/20/19 04/20/19  Meryle Ip A, PA-C  levonorgestrel  (MIRENA ) 20 MCG/24HR IUD 1 each by Intrauterine route once.   06/25/20  [provider]    Allergies: Shellfish allergy , Sulfa  antibiotics, and Adhesive [tape]    Review of Systems  Genitourinary:  Positive for flank pain.    Updated Vital Signs BP (!) 141/49 (BP Location: Right Arm)   Pulse 80   Temp 97.8 F (36.6 C) (Oral)   Resp 18   Ht 5' 1 (1.549 m)   Wt (!) 156.9 kg   LMP 12/26/2022 (Approximate)   SpO2 97%   BMI 65.38 kg/m   Physical Exam Vitals and nursing note reviewed.  HENT:     Head: Normocephalic and atraumatic.     Mouth/Throat:     Mouth: Mucous membranes are moist.  Eyes:     General:        Right eye: No discharge.        Left eye: No discharge.     Conjunctiva/sclera: Conjunctivae  normal.  Cardiovascular:     Rate and Rhythm: Normal rate and regular rhythm.     Pulses: Normal pulses.     Heart sounds: Normal heart sounds.  Pulmonary:     Effort: Pulmonary effort is normal.     Breath sounds: Normal breath sounds.  Abdominal:     General: Abdomen is flat. There is no distension.     Palpations: Abdomen is soft.     Tenderness: There is abdominal tenderness in the right upper quadrant. There is right CVA tenderness.  Skin:    General: Skin is warm and dry.  Neurological:     General: No focal deficit present.  Psychiatric:  Mood and Affect: Mood normal.     (all labs ordered are listed, but only abnormal results are displayed) Labs Reviewed  CBC WITH DIFFERENTIAL/PLATELET - Abnormal; Notable for the following components:      Result Value   Hemoglobin 10.6 (*)    HCT 33.8 (*)    All other components within normal limits  COMPREHENSIVE METABOLIC PANEL WITH GFR - Abnormal; Notable for the following components:   Glucose, Bld 118 (*)    Albumin 3.4 (*)    AST 14 (*)    All other components within normal limits  LIPASE, BLOOD  HCG, SERUM, QUALITATIVE  URINALYSIS, ROUTINE W REFLEX MICROSCOPIC    EKG: None  Radiology: CT ABDOMEN PELVIS W CONTRAST Result Date: 11/14/2023 CLINICAL DATA:  Acute nonlocalized abdominal pain, right flank pain, hematuria EXAM: CT ABDOMEN AND PELVIS WITH CONTRAST TECHNIQUE: Multidetector CT imaging of the abdomen and pelvis was performed using the standard protocol following bolus administration of intravenous contrast. RADIATION DOSE REDUCTION: This exam was performed according to the departmental dose-optimization program which includes automated exposure control, adjustment of the mA and/or kV according to patient size and/or use of iterative reconstruction technique. CONTRAST:  OMNIPAQUE  IOHEXOL  300 MG/ML  SOLN COMPARISON:  None Available. FINDINGS: Lower chest: No acute abnormality. Hepatobiliary: No focal liver  abnormality is seen. No gallstones, gallbladder wall thickening, or biliary dilatation. Pancreas: Unremarkable Spleen: Unremarkable Adrenals/Urinary Tract: Adrenal glands are unremarkable. Kidneys are normal, without renal calculi, focal lesion, or hydronephrosis. Bladder is unremarkable. Stomach/Bowel: Stomach is within normal limits. Appendix appears normal. No evidence of bowel wall thickening, distention, or inflammatory changes. Vascular/Lymphatic: No significant vascular findings are present. No enlarged abdominal or pelvic lymph nodes. Reproductive: Uterus and bilateral adnexa are unremarkable. Other: Small fat containing umbilical hernia Musculoskeletal: No acute or significant osseous findings. IMPRESSION: 1. No acute abdominopelvic findings. 2. Small fat containing umbilical hernia. Electronically Signed   By: Dorethia Molt M.D.   On: 11/14/2023 22:31     Procedures   Medications Ordered in the ED  morphine  (PF) 4 MG/ML injection 4 mg (4 mg Intravenous Given 11/14/23 2026)  ondansetron  (ZOFRAN ) injection 4 mg (4 mg Intravenous Given 11/14/23 2024)  sodium chloride  0.9 % bolus 1,000 mL (1,000 mLs Intravenous New Bag/Given 11/14/23 2039)  iohexol  (OMNIPAQUE ) 300 MG/ML solution 100 mL (100 mLs Intravenous Contrast Given 11/14/23 2200)                                    Medical Decision Making Amount and/or Complexity of Data Reviewed Labs: ordered. Radiology: ordered.  Risk Prescription drug management.   Initial Impression and Ddx 38 year old well-appearing female presenting for abdominal pain.  Exam notable for right upper quadrant and right CVA tenderness.  DDx includes kidney stone, pyelonephritis, acute cholecystitis acute pancreatitis, ectopic pregnancy, UTI, obstruction, other. Patient PMH that increases complexity of ED encounter: hypertension, diabetes, uterine fibroids   Interpretation of Diagnostics - I independent reviewed and interpreted the labs as followed: mild  anemia, small amount of hematuria, rare bacteria   - I independently visualized the following imaging with scope of interpretation limited to determining acute life threatening conditions related to emergency care: CT ab/pelvis, which revealed no acute findings, there was a small fat-containing umbilical hernia.  Shared findings with patient.  Patient Reassessment and Ultimate Disposition/Management Symptoms improved after treatment.  Workup unremarkable.  Feel there is no emergent or acute process that  warrants intervention or admission to the hospital that she is safe for discharge.  Advised her to follow-up with her PCP.  Discussed return precautions.  Discharged good condition.  Patient management required discussion with the following services or consulting groups:  None  Complexity of Problems Addressed Acute complicated illness or Injury  Additional Data Reviewed and Analyzed Further history obtained from: Past medical history and medications listed in the EMR and Prior ED visit notes  Patient Encounter Risk Assessment Consideration of hospitalization     Final diagnoses:  Flank pain    ED Discharge Orders          Ordered    ondansetron  (ZOFRAN ) 4 MG tablet  Every 6 hours        11/14/23 2240               Lang Norleen POUR, PA-C 11/14/23 2300    Jerrol Agent, MD 11/15/23 1702

## 2023-11-14 NOTE — ED Notes (Signed)
 Pt unable to void at this time. Reports last void approx 30 minutes pta at [facility].

## 2023-11-14 NOTE — ED Triage Notes (Signed)
 Pt reports right sided abdomen pain that is constant since yesterday, hematuria, nausea since this am.

## 2023-11-14 NOTE — ED Notes (Signed)
..  The patient is A&OX4, ambulatory at d/c with independent steady gait but wheeled out of ED via wheelchair, NAD. Pt verbalized understanding of d/c instructions and follow up care.

## 2023-11-14 NOTE — Discharge Instructions (Addendum)
 You were given an injection of Toradol  30 mg and Zofran  4 mg oral tablet.  Do not take any additional NSAIDs today such as ibuprofen , Aleve , Motrin , or naproxen .  You may take Tylenol  for breakthrough pain or discomfort. Your urine culture is pending.  You will be contacted if the pending test results are abnormal. Take medication as prescribed. Your urinalysis shows that you need to increase your water  intake.  Try to drink at least 8-10 8 ounce glasses of water  while symptoms persist. Use the urine strainer provided today's to strain your urine to determine whether or not you have passed a kidney stone. Develop a toileting schedule that will allow you to urinate at least every 2 hours. If you develop worsening pain, nausea, or develop new symptoms such as fever or chills, please go to the emergency department immediately for further evaluation. Follow-up as needed.

## 2023-11-14 NOTE — Discharge Instructions (Addendum)
 Evaluation today was overall reassuring.  Please follow-up with your PCP.  If your symptoms return or you develop a fever cannot tolerate fluid intake or any other concerning symptom please return to the ED for further evaluation.  I sent Zofran  to your pharmacy for nausea and vomiting.

## 2023-11-15 LAB — URINE CULTURE

## 2023-11-17 ENCOUNTER — Ambulatory Visit (HOSPITAL_COMMUNITY): Payer: Self-pay

## 2023-11-18 NOTE — Addendum Note (Signed)
 Addended by: JENEL MARYLYNN GRADE on: 11/18/2023 04:36 PM   Modules accepted: Orders

## 2023-11-21 ENCOUNTER — Other Ambulatory Visit (HOSPITAL_COMMUNITY): Payer: Self-pay

## 2023-11-21 ENCOUNTER — Other Ambulatory Visit: Payer: Self-pay

## 2023-11-21 ENCOUNTER — Inpatient Hospital Stay: Payer: Self-pay | Admitting: Nurse Practitioner

## 2023-11-21 DIAGNOSIS — R32 Unspecified urinary incontinence: Secondary | ICD-10-CM | POA: Diagnosis not present

## 2023-11-21 NOTE — Progress Notes (Signed)
 DAP note              Client name: Jessica Sanchez Therapist name: Camie Norris Higinio [place] SILK Date: 09/21/2023 Time: 60 mins   Data: Client reports that she is doing okay. She reports that she had to go to the emergency room because she was having horrible pains in her stomach. She stated that she did not want to go but it was so bad that her doctor recommended it. She states that she is feeling better but that it scared her. Client reports depressive symptoms are at a 6 and would like to get that to a 2 with the use of interventions.   Assessment: The client was a little bit out of it due to being at the emergency room till late the day before. It was a real show of commitment to her mental health care that she did not want to cancel her appointment. We discussed the importance of her having that same commitment to her physical health care.   Plan: The client will attend bi-weekly sessions with a goal of attending at least 2 per month. Client will take a moment of mindfulness daily and utilize box breathing as needed to reduce symptoms. Work on healing past trauma by figuring out triggers, to help with symptom reduction, emotional regulation, and processing.

## 2023-11-21 NOTE — Progress Notes (Signed)
 Please have her try the avoidance measures and new medications and monitor her symptoms. Will address further in follow up if needed. Thank you

## 2023-11-22 ENCOUNTER — Other Ambulatory Visit: Payer: Self-pay

## 2023-11-22 NOTE — Progress Notes (Signed)
 Credit card on file declined. Call center left vm for patient on [phone] and patient never returned call. Used AR for copayment to prevent delays, couriering to [place] [facility] [phone]

## 2023-11-23 ENCOUNTER — Ambulatory Visit: Admitting: Psychology

## 2023-11-23 ENCOUNTER — Ambulatory Visit

## 2023-11-23 DIAGNOSIS — F321 Major depressive disorder, single episode, moderate: Secondary | ICD-10-CM | POA: Diagnosis not present

## 2023-11-24 ENCOUNTER — Ambulatory Visit (INDEPENDENT_AMBULATORY_CARE_PROVIDER_SITE_OTHER): Admitting: Nurse Practitioner

## 2023-11-24 VITALS — BP 133/81 | HR 84 | Resp 16 | Ht 61.0 in | Wt 350.0 lb

## 2023-11-24 DIAGNOSIS — K429 Umbilical hernia without obstruction or gangrene: Secondary | ICD-10-CM | POA: Diagnosis not present

## 2023-11-24 DIAGNOSIS — I10 Essential (primary) hypertension: Secondary | ICD-10-CM

## 2023-11-24 DIAGNOSIS — M545 Low back pain, unspecified: Secondary | ICD-10-CM | POA: Diagnosis not present

## 2023-11-24 DIAGNOSIS — K5900 Constipation, unspecified: Secondary | ICD-10-CM

## 2023-11-24 NOTE — Progress Notes (Addendum)
 Established Patient Office Visit  Subjective:  Patient ID: Jessica Sanchez, female    DOB: Feb 25, 1986  Age: 38 y.o. MRN: [id number]  Chief Complaint  Patient presents with   ER follow up    Right flank and right lower abdominal pain and urinating blood Went to UC 7/14 and was told kidney stones and she went same day to the ER and was told she had a hernia. Still having the pain. Still urinating a small amount of blood     Patient here today for urgent care follow up of abdominal pain radiating to the right side and lower back pain.  She states she was told by urgent care that she has kidney stone that she was passing, hematuria.  Patient was told by CT abd/pelvis by ER that she had a small umbilical hernia.  Patient also reports urinating while sleeping and states she only takes a sip of water  with her meds 1 hour prior to bedtime.      No other concerns at this time.   Past Medical History:  Diagnosis Date   Angio-edema    Anxiety    Arthritis    right shoulder   Asthma    Diabetes mellitus without complication (HCC)    Dyspnea    Dysrhythmia    hx of SVT   Fibroid (bleeding) (uterine)    Fibroid, uterine    H/O metrorrhagia    Hypertension    IUD (intrauterine device) in place 07/11/2015   Menorrhagia    Obesity, Class III, BMI 40-49.9 (morbid obesity) 11/11/2018   Sleep apnea    uses CPAP - setting 11   SVT (supraventricular tachycardia) (HCC)     Past Surgical History:  Procedure Laterality Date   DILATATION AND CURETTAGE/HYSTEROSCOPY WITH MINERVA N/A 12/01/2021   Procedure: DILATATION AND CURETTAGE/HYSTEROSCOPY WITH MINERVA;  Surgeon: Jayne Vonn DEL, MD;  Location: [facility];  Service: Gynecology;  Laterality: N/A;   HYSTERECTOMY ABDOMINAL WITH SALPINGECTOMY Bilateral 01/19/2023   Procedure: HYSTERECTOMY ABDOMINAL WITH SALPINGECTOMY;  Surgeon: Jayne Vonn DEL, MD;  Location: [facility];  Service: Gynecology;  Laterality: Bilateral;   HYSTEROSCOPY N/A 09/17/2020    Procedure: HYSTEROSCOPY;  Surgeon: Jayne Vonn DEL, MD;  Location: [facility];  Service: Gynecology;  Laterality: N/A;   HYSTEROSCOPY WITH D & C N/A 09/27/2013   Procedure: DILATATION AND CURETTAGE /HYSTEROSCOPY and insertion of Mirena  IUD ;  Surgeon: Marjorie DEL. Okey, MD;  Location: [facility];  Service: Gynecology;  Laterality: N/A;   IUD REMOVAL N/A 09/17/2020   Procedure: INTRAUTERINE DEVICE (IUD) REMOVAL (2);  Surgeon: Jayne Vonn DEL, MD;  Location: [facility];  Service: Gynecology;  Laterality: N/A;   LAPAROSCOPY N/A 01/19/2023   Procedure: LAPAROSCOPY DIAGNOSTIC;  Surgeon: Jayne Vonn DEL, MD;  Location: [facility];  Service: Gynecology;  Laterality: N/A;   MYOMECTOMY  02/03/2011   Procedure: MYOMECTOMY;  Surgeon: Vonn DEL Jayne, MD;  Location: [facility];  Service: Gynecology;  Laterality: N/A;   SVT ABLATION N/A 11/08/2018   Procedure: SVT ABLATION;  Surgeon: Waddell Danelle ORN, MD;  Location: [facility];  Service: Cardiovascular;  Laterality: N/A;   SVT ABLATION N/A 12/26/2020   Procedure: SVT ABLATION;  Surgeon: Waddell Danelle ORN, MD;  Location: [facility] CV LAB;  Service: Cardiovascular;  Laterality: N/A;   TONSILLECTOMY     TYMPANOSTOMY TUBE PLACEMENT      Social History   Socioeconomic History   Marital status: Single    Spouse name: Not on file  Number of children: 0   Years of education: Not on file   Highest education level: 12th grade  Occupational History   Not on file  Tobacco Use   Smoking status: Never   Smokeless tobacco: Never   Tobacco comments:    socially  Vaping Use   Vaping status: Never Used  Substance and Sexual Activity   Alcohol use: Not Currently   Drug use: Not Currently    Types: Marijuana   Sexual activity: Not Currently    Birth control/protection: Surgical    Comment: hyst  Other Topics Concern   Not on file  Social History Narrative   Not on file   Social Drivers of Health   Financial Resource Strain: Low Risk  (05/15/2023)   Overall Financial  Resource Strain (CARDIA)    Difficulty of Paying Living Expenses: Not very hard  Food Insecurity: No Food Insecurity (05/15/2023)   Hunger Vital Sign    Worried About Running Out of Food in the Last Year: Never true    Ran Out of Food in the Last Year: Never true  Transportation Needs: No Transportation Needs (05/15/2023)   PRAPARE - Administrator, Civil Service (Medical): No    Lack of Transportation (Non-Medical): No  Physical Activity: Sufficiently Active (05/15/2023)   Exercise Vital Sign    Days of Exercise per Week: 5 days    Minutes of Exercise per Session: 30 min  Stress: Stress Concern Present (05/15/2023)   [employer] of Occupational Health - Occupational Stress Questionnaire    Feeling of Stress : Rather much  Social Connections: Moderately Isolated (05/15/2023)   Social Connection and Isolation Panel    Frequency of Communication with Friends and Family: More than three times a week    Frequency of Social Gatherings with Friends and Family: Once a week    Attends Religious Services: More than 4 times per year    Active Member of [employer] or Organizations: No    Attends Engineer, structural: Not on file    Marital Status: Never married  Intimate Partner Violence: Not At Risk (01/19/2023)   Humiliation, Afraid, Rape, and Kick questionnaire    Fear of Current or Ex-Partner: No    Emotionally Abused: No    Physically Abused: No    Sexually Abused: No    Family History  Problem Relation Age of Onset   Cirrhosis Mother    Diabetes Mother    Cancer Maternal Grandfather    Hypertension Sister    Diabetes Sister    Anesthesia problems Neg Hx    Hypotension Neg Hx    Malignant hyperthermia Neg Hx    Pseudochol deficiency Neg Hx     Allergies  Allergen Reactions   Shellfish Allergy  Shortness Of Breath, Itching and Swelling    Mouth became swollen, throat started itching, and developed shortness of breath   Sulfa  Antibiotics Shortness Of Breath,  Itching and Swelling    Mouth became swollen, throat started itching, and developed shortness of breath   Adhesive [Tape] Itching    Depends on the type of medical tape    Outpatient Medications Prior to Visit  Medication Sig   albuterol  (VENTOLIN  HFA) 108 (90 Base) MCG/ACT inhaler Inhale 2 puffs into the lungs every 6 (six) hours as needed.   amlodipine -olmesartan  (AZOR ) 10-20 MG tablet TAKE ONE TABLET BY MOUTH EVERY DAY   Blood Glucose Monitoring Suppl DEVI 1 each by Does not apply route in the morning, at noon,  and at bedtime. May substitute to any manufacturer covered by patient's insurance.   diazepam  (VALIUM ) 5 MG tablet Take 1 tablet (5 mg total) by mouth once as needed for up to 1 dose for anxiety (take 15-30 minutes before procedure).   EPINEPHrine  (EPIPEN  2-PAK) 0.3 mg/0.3 mL IJ SOAJ injection Inject 0.3 mg into the muscle as needed for anaphylaxis.   famotidine  (PEPCID ) 20 MG tablet Take 1 tablet (20 mg total) by mouth 2 (two) times daily. Take one tablet twice daily for two days   famotidine  (PEPCID ) 40 MG tablet Take 1 tablet (40 mg total) by mouth in the morning and at bedtime.   Fluticasone -Umeclidin-Vilant (TRELEGY ELLIPTA ) 200-62.5-25 MCG/ACT AEPB Inhale 1 puff into the lungs daily.   gabapentin  (NEURONTIN ) 300 MG capsule Take 1 capsule (300 mg total) by mouth at bedtime.   ibuprofen  (ADVIL ) 800 MG tablet Take 1 tablet (800 mg total) by mouth 3 (three) times daily.   levocetirizine (XYZAL ) 5 MG tablet Take 1 tablet (5 mg total) by mouth 2 (two) times daily as needed for allergies (hives/itching).   metFORMIN  (GLUCOPHAGE ) 1000 MG tablet TAKE ONE-HALF TABLET BY MOUTH TWICE DAILY WITH A MEAL (Patient taking differently: Take 250 mg by mouth 2 (two) times daily.)   montelukast  (SINGULAIR ) 10 MG tablet Take 1 tablet (10 mg total) by mouth at bedtime.   Multiple Vitamins-Calcium (ONE-A-DAY WOMENS PO) Take 1 tablet by mouth daily.   NON FORMULARY Pt uses a cpap nightly   omalizumab   (XOLAIR ) 300 MG/2  ML prefilled syringe Inject 300 mg into the skin every 28 (twenty-eight) days.   ondansetron  (ZOFRAN ) 4 MG tablet Take 1 tablet (4 mg total) by mouth every 6 (six) hours.   ondansetron  (ZOFRAN -ODT) 4 MG disintegrating tablet Take 1 tablet (4 mg total) by mouth every 8 (eight) hours as needed for nausea or vomiting.   sucralfate  (CARAFATE ) 1 g tablet Take 1 tablet (1 g total) by mouth 4 (four) times daily -  with meals and at bedtime.   SUMAtriptan  (IMITREX ) 100 MG tablet TAKE ONE TABLET BY MOUTH EVERY 2 HOURS AS NEEDED FOR MIGRAINE. MAY REPEAT IN 2 HOURS IF HEADACHE PERSISTS OR RECURS (Patient taking differently: every 2 (two) hours as needed.)   tamsulosin  (FLOMAX ) 0.4 MG CAPS capsule Take 1 capsule (0.4 mg total) by mouth daily after supper.   tirzepatide  (ZEPBOUND ) 2.5 MG/0.5ML injection vial Inject 2.5 mg into the skin once a week.   triamcinolone  ointment (KENALOG ) 0.1 % Apply twice daily for flare ups below neck, maximum 10 days.   Vitamin D , Ergocalciferol , (DRISDOL ) 1.25 MG (50000 UNIT) CAPS capsule Take 1 capsule (50,000 Units total) by mouth every 7 (seven) days.   predniSONE  (DELTASONE ) 20 MG tablet Take 2 tablets (40 mg total) by mouth daily with breakfast. For the next four days (Patient not taking: Reported on 11/24/2023)   Facility-Administered Medications Prior to Visit  Medication Dose Route Frequency Provider   omalizumab  (XOLAIR ) prefilled syringe 300 mg  300 mg Subcutaneous Q28 days Iva Marty Saltness, MD    ROS     Objective:   BP 133/81   Pulse 84   Resp 16   Ht 5' 1 (1.549 m)   Wt (!) 350 lb (158.8 kg)   LMP 12/26/2022 (Approximate)   SpO2 100%   BMI 66.13 kg/m   Vitals:   11/24/23 1552  BP: 133/81  Pulse: 84  Resp: 16  Height: 5' 1 (1.549 m)  Weight: (!) 350 lb (158.8  kg)  SpO2: 100%  BMI (Calculated): 66.17    Physical Exam Vitals and nursing note reviewed.  Constitutional:      Appearance: Normal appearance.  HENT:      Head: Normocephalic.     Nose: Nose normal.     Mouth/Throat:     Mouth: Mucous membranes are moist.  Eyes:     Extraocular Movements: Extraocular movements intact.     Pupils: Pupils are equal, round, and reactive to light.  Cardiovascular:     Rate and Rhythm: Normal rate and regular rhythm.     Pulses: Normal pulses.     Heart sounds: Normal heart sounds.  Pulmonary:     Effort: Pulmonary effort is normal.     Breath sounds: Normal breath sounds.  Abdominal:     General: Bowel sounds are normal.     Palpations: Abdomen is soft.  Musculoskeletal:        General: Normal range of motion.     Cervical back: Normal range of motion and neck supple.  Skin:    General: Skin is warm and dry.  Neurological:     Mental Status: She is alert and oriented to person, place, and time.  Psychiatric:        Mood and Affect: Mood normal.        Behavior: Behavior normal.      No results found for any visits on 11/24/23.  Recent Results (from the past 2160 hours)  Urinalysis, Routine w reflex microscopic -Urine, Clean Catch     Status: None   Collection Time: 09/20/23  4:52 PM  Result Value Ref Range   Color, Urine YELLOW YELLOW   APPearance CLEAR CLEAR   Specific Gravity, Urine 1.021 1.005 - 1.030   pH 6.0 5.0 - 8.0   Glucose, UA NEGATIVE NEGATIVE mg/dL   Hgb urine dipstick NEGATIVE NEGATIVE   Bilirubin Urine NEGATIVE NEGATIVE   Ketones, ur NEGATIVE NEGATIVE mg/dL   Protein, ur NEGATIVE NEGATIVE mg/dL   Nitrite NEGATIVE NEGATIVE   Leukocytes,Ua NEGATIVE NEGATIVE    Comment: Performed at [facility], [address], [place], [place] [address]  Lipase, blood     Status: None   Collection Time: 09/20/23  5:33 PM  Result Value Ref Range   Lipase 25 11 - 51 U/L    Comment: Performed at [facility], [address] [place], [place] [address]  Comprehensive metabolic panel     Status: Abnormal   Collection Time: 09/20/23  5:33 PM  Result Value Ref Range   Sodium 132 (L) 135 - 145  mmol/L   Potassium 3.9 3.5 - 5.1 mmol/L   Chloride 98 98 - 111 mmol/L   CO2 26 22 - 32 mmol/L   Glucose, Bld 86 70 - 99 mg/dL    Comment: Glucose reference range applies only to samples taken after fasting for at least 8 hours.   BUN 10 6 - 20 mg/dL   Creatinine, Ser 9.41 0.44 - 1.00 mg/dL   Calcium 8.9 8.9 - 89.6 mg/dL   Total Protein 7.2 6.5 - 8.1 g/dL   Albumin 3.5 3.5 - 5.0 g/dL   AST 11 (L) 15 - 41 U/L   ALT 14 0 - 44 U/L   Alkaline Phosphatase 61 38 - 126 U/L   Total Bilirubin 0.3 0.0 - 1.2 mg/dL   GFR, Estimated >39 >39 mL/min    Comment: (NOTE) Calculated using the CKD-EPI Creatinine Equation (2021)    Anion gap 8 5 -  15    Comment: Performed at [facility], [address], [place], [place] [address]  CBC     Status: Abnormal   Collection Time: 09/20/23  5:33 PM  Result Value Ref Range   WBC 6.5 4.0 - 10.5 K/uL   RBC 4.31 3.87 - 5.11 MIL/uL   Hemoglobin 11.2 (L) 12.0 - 15.0 g/dL   HCT 63.1 63.9 - 53.9 %   MCV 85.4 80.0 - 100.0 fL   MCH 26.0 26.0 - 34.0 pg   MCHC 30.4 30.0 - 36.0 g/dL   RDW 84.5 88.4 - 84.4 %   Platelets 316 150 - 400 K/uL   nRBC 0.0 0.0 - 0.2 %    Comment: Performed at [facility], [address], [place], [place] [address]  Food Allergy  Profile     Status: None   Collection Time: 10/21/23  2:30 PM  Result Value Ref Range   Egg White IgE <0.10 Class 0 kU/L   Peanut IgE <0.10 Class 0 kU/L   Soybean IgE <0.10 Class 0 kU/L   Milk IgE <0.10 Class 0 kU/L   Clam IgE <0.10 Class 0 kU/L   Shrimp IgE <0.10 Class 0 kU/L   Walnut IgE <0.10 Class 0 kU/L   Codfish IgE <0.10 Class 0 kU/L   Scallop IgE <0.10 Class 0 kU/L   Wheat IgE <0.10 Class 0 kU/L   Allergen Corn, IgE <0.10 Class 0 kU/L   Sesame Seed IgE <0.10 Class 0 kU/L  Alpha-Gal Panel     Status: None   Collection Time: 10/21/23  2:30 PM  Result Value Ref Range   Class Description Allergens Comment     Comment:     Levels of Specific IgE       Class  Description of Class      ---------------------------  -----  --------------------                    < 0.10         0         Negative            0.10 -    0.31         0/I       Equivocal/Low            0.32 -    0.55         I         Low            0.56 -    1.40         II        Moderate            1.41 -    3.90         III       High            3.91 -   19.00         IV        Very High           19.01 -  100.00         V         Very High                   >100.00         VI        Very High    IgE (Immunoglobulin  E), Serum 39 6 - 495 IU/mL   Pork IgE <0.10 Class 0 kU/L   Beef IgE <0.10 Class 0 kU/L   Allergen Lamb IgE <0.10 Class 0 kU/L   O215-[id number] Alpha-Gal <0.10 Class 0 kU/L  Chronic Urticaria (CU) Eval     Status: Abnormal   Collection Time: 10/21/23  2:30 PM  Result Value Ref Range   ALT 11 0 - 32 IU/L   TSH 3.070 0.450 - 4.500 uIU/mL   Thyroperoxidase Ab SerPl-aCnc 10 0 - 34 IU/mL   CRP 19 (H) 0 - 10 mg/L   Pooled Donor- BAT CU 3.00 0.00 - 10.60 %   WBC 5.3 3.4 - 10.8 x10E3/uL   RBC 4.48 3.77 - 5.28 x10E6/uL   Hemoglobin 11.6 11.1 - 15.9 g/dL   Hematocrit 62.1 65.9 - 46.6 %   MCV 84 79 - 97 fL   MCH 25.9 (L) 26.6 - 33.0 pg   MCHC 30.7 (L) 31.5 - 35.7 g/dL   RDW 85.2 88.2 - 84.5 %   Platelets 142 (L) 150 - 450 x10E3/uL   Neutrophils 51 Not Estab. %   Lymphs 40 Not Estab. %   Monocytes 7 Not Estab. %   Eos 2 Not Estab. %   Basos 0 Not Estab. %   Neutrophils Absolute 2.7 1.4 - 7.0 x10E3/uL   Lymphocytes Absolute 2.1 0.7 - 3.1 x10E3/uL   Monocytes Absolute 0.4 0.1 - 0.9 x10E3/uL   EOS (ABSOLUTE) 0.1 0.0 - 0.4 x10E3/uL   Basophils Absolute 0.0 0.0 - 0.2 x10E3/uL   Immature Granulocytes 0 Not Estab. %   Immature Grans (Abs) 0.0 0.0 - 0.1 x10E3/uL   Sed Rate 48 (H) 0 - 32 mm/hr  Basic metabolic panel     Status: Abnormal   Collection Time: 10/24/23  2:05 PM  Result Value Ref Range   Sodium 137 135 - 145 mmol/L   Potassium 4.6 3.5 - 5.1 mmol/L   Chloride 103 98 - 111 mmol/L   CO2 20  (L) 22 - 32 mmol/L   Glucose, Bld 84 70 - 99 mg/dL    Comment: Glucose reference range applies only to samples taken after fasting for at least 8 hours.   BUN 10 6 - 20 mg/dL   Creatinine, Ser 9.33 0.44 - 1.00 mg/dL   Calcium 9.2 8.9 - 89.6 mg/dL   GFR, Estimated >39 >39 mL/min    Comment: (NOTE) Calculated using the CKD-EPI Creatinine Equation (2021)    Anion gap 14 5 - 15    Comment: Performed at [facility], [address] [place], [place] [address]  CBC     Status: Abnormal   Collection Time: 10/24/23  2:05 PM  Result Value Ref Range   WBC 5.7 4.0 - 10.5 K/uL   RBC 4.40 3.87 - 5.11 MIL/uL   Hemoglobin 11.7 (L) 12.0 - 15.0 g/dL   HCT 63.0 63.9 - 53.9 %   MCV 83.9 80.0 - 100.0 fL   MCH 26.6 26.0 - 34.0 pg   MCHC 31.7 30.0 - 36.0 g/dL   RDW 84.3 (H) 88.4 - 84.4 %   Platelets 317 150 - 400 K/uL   nRBC 0.0 0.0 - 0.2 %    Comment: Performed at [facility], [address] [place], [place] [address]  Troponin I (High Sensitivity)     Status: None   Collection Time: 10/24/23  2:05 PM  Result Value Ref Range   Troponin I (High Sensitivity) <2 <18 ng/L  Comment: (NOTE) Elevated high sensitivity troponin I (hsTnI) values and significant  changes across serial measurements may suggest ACS but many other  chronic and acute conditions are known to elevate hsTnI results.  Refer to the Links section for chest pain algorithms and additional  guidance. Performed at [facility], [address] [place], [place] [address]   D-dimer, quantitative     Status: Abnormal   Collection Time: 10/24/23  2:05 PM  Result Value Ref Range   D-Dimer, Quant 0.60 (H) 0.00 - 0.50 ug/mL-FEU    Comment: (NOTE) At the manufacturer cut-off value of 0.5 g/mL FEU, this assay has a negative predictive value of 95-100%.This assay is intended for use in conjunction with a clinical pretest probability (PTP) assessment model to exclude pulmonary embolism (PE) and deep venous thrombosis (DVT) in outpatients  suspected of PE or DVT. Results should be correlated with clinical presentation. Performed at [facility], [address] [place], [place] [address]   Hepatic function panel     Status: None   Collection Time: 10/24/23  2:05 PM  Result Value Ref Range   Total Protein 7.9 6.5 - 8.1 g/dL   Albumin 3.9 3.5 - 5.0 g/dL   AST 15 15 - 41 U/L   ALT 14 0 - 44 U/L   Alkaline Phosphatase 63 38 - 126 U/L   Total Bilirubin 0.7 0.0 - 1.2 mg/dL   Bilirubin, Direct 0.1 0.0 - 0.2 mg/dL   Indirect Bilirubin 0.6 0.3 - 0.9 mg/dL    Comment: Performed at [facility], [address], [place], [place] [address]  Lipase, blood     Status: None   Collection Time: 10/24/23  2:05 PM  Result Value Ref Range   Lipase 26 11 - 51 U/L    Comment: Performed at [facility], [address], [place], [place] [address]  Troponin I (High Sensitivity)     Status: None   Collection Time: 10/24/23  3:50 PM  Result Value Ref Range   Troponin I (High Sensitivity) <2 <18 ng/L    Comment: (NOTE) Elevated high sensitivity troponin I (hsTnI) values and significant  changes across serial measurements may suggest ACS but many other  chronic and acute conditions are known to elevate hsTnI results.  Refer to the Links section for chest pain algorithms and additional  guidance. Performed at [facility], [address] [place], [place] [address]   Brain natriuretic peptide     Status: None   Collection Time: 10/24/23  3:50 PM  Result Value Ref Range   B Natriuretic Peptide 26.0 0.0 - 100.0 pg/mL    Comment: Performed at [facility], [address], [place], [place] [address]  CBG monitoring, ED     Status: Abnormal   Collection Time: 11/07/23  4:26 PM  Result Value Ref Range   Glucose-Capillary 60 (L) 70 - 99 mg/dL    Comment: Glucose reference range applies only to samples taken after fasting for at least 8 hours.  CBG monitoring, ED     Status: Abnormal   Collection Time: 11/07/23  5:02 PM  Result Value Ref Range    Glucose-Capillary 69 (L) 70 - 99 mg/dL    Comment: Glucose reference range applies only to samples taken after fasting for at least 8 hours.  Urinalysis, w/ Reflex to Culture (Infection Suspected) -Urine, Unspecified Source     Status: Abnormal   Collection Time: 11/07/23  7:06 PM  Result Value Ref Range   Specimen Source URINE, UNSPE  Color, Urine YELLOW YELLOW   APPearance CLEAR CLEAR   Specific Gravity, Urine 1.010 1.005 - 1.030   pH 6.0 5.0 - 8.0   Glucose, UA NEGATIVE NEGATIVE mg/dL   Hgb urine dipstick SMALL (A) NEGATIVE   Bilirubin Urine NEGATIVE NEGATIVE   Ketones, ur NEGATIVE NEGATIVE mg/dL   Protein, ur NEGATIVE NEGATIVE mg/dL   Nitrite NEGATIVE NEGATIVE   Leukocytes,Ua NEGATIVE NEGATIVE   RBC / HPF 0-5 0 - 5 RBC/hpf   WBC, UA 0-5 0 - 5 WBC/hpf    Comment:        Reflex urine culture not performed if WBC <=10, OR if Squamous epithelial cells >5. If Squamous epithelial cells >5 suggest recollection.    Bacteria, UA NONE SEEN NONE SEEN   Squamous Epithelial / HPF 0-5 0 - 5 /HPF   Mucus PRESENT    Amorphous Crystal PRESENT     Comment: Performed at [facility], [address] [place], [place] [address]  CBC with Differential     Status: Abnormal   Collection Time: 11/07/23  8:14 PM  Result Value Ref Range   WBC 5.9 4.0 - 10.5 K/uL   RBC 4.22 3.87 - 5.11 MIL/uL   Hemoglobin 10.9 (L) 12.0 - 15.0 g/dL   HCT 64.8 (L) 63.9 - 53.9 %   MCV 83.2 80.0 - 100.0 fL   MCH 25.8 (L) 26.0 - 34.0 pg   MCHC 31.1 30.0 - 36.0 g/dL   RDW 84.8 88.4 - 84.4 %   Platelets 343 150 - 400 K/uL   nRBC 0.0 0.0 - 0.2 %   Neutrophils Relative % 53 %   Neutro Abs 3.1 1.7 - 7.7 K/uL   Lymphocytes Relative 37 %   Lymphs Abs 2.2 0.7 - 4.0 K/uL   Monocytes Relative 10 %   Monocytes Absolute 0.6 0.1 - 1.0 K/uL   Eosinophils Relative 0 %   Eosinophils Absolute 0.0 0.0 - 0.5 K/uL   Basophils Relative 0 %   Basophils Absolute 0.0 0.0 - 0.1 K/uL   WBC Morphology ATYPICAL LYMPHOCYTES PRESENT     RBC Morphology MORPHOLOGY UNREMARKABLE    Smear Review MORPHOLOGY UNREMARKABLE    Abs Immature Granulocytes 0.00 0.00 - 0.07 K/uL    Comment: Performed at [facility], [address], [place], [place] [address]  Comprehensive metabolic panel     Status: Abnormal   Collection Time: 11/07/23  8:14 PM  Result Value Ref Range   Sodium 135 135 - 145 mmol/L   Potassium 4.1 3.5 - 5.1 mmol/L   Chloride 99 98 - 111 mmol/L   CO2 24 22 - 32 mmol/L   Glucose, Bld 82 70 - 99 mg/dL    Comment: Glucose reference range applies only to samples taken after fasting for at least 8 hours.   BUN 10 6 - 20 mg/dL   Creatinine, Ser 9.35 0.44 - 1.00 mg/dL   Calcium 9.1 8.9 - 89.6 mg/dL   Total Protein 7.2 6.5 - 8.1 g/dL   Albumin 3.5 3.5 - 5.0 g/dL   AST 11 (L) 15 - 41 U/L   ALT 13 0 - 44 U/L   Alkaline Phosphatase 67 38 - 126 U/L   Total Bilirubin 0.6 0.0 - 1.2 mg/dL   GFR, Estimated >39 >39 mL/min    Comment: (NOTE) Calculated using the CKD-EPI Creatinine Equation (2021)    Anion gap 12 5 - 15    Comment: Performed at [facility], [address] [place], [place] [address]  TSH     Status: None   Collection Time: 11/07/23  8:14 PM  Result Value Ref Range   TSH 2.391 0.350 - 4.500 uIU/mL    Comment: Performed by a 3rd Generation assay with a functional sensitivity of <=0.01 uIU/mL. Performed at [facility], [address] [place], [place] [address]   T4, free     Status: None   Collection Time: 11/07/23  8:45 PM  Result Value Ref Range   Free T4 0.90 0.61 - 1.12 ng/dL    Comment: (NOTE) Biotin ingestion may interfere with free T4 tests. If the results are inconsistent with the TSH level, previous test results, or the clinical presentation, then consider biotin interference. If needed, order repeat testing after stopping biotin. Performed at [facility], [address] [place], [place] [address]   POCT urinalysis dipstick     Status: Abnormal   Collection Time: 11/14/23  3:16 PM   Result Value Ref Range   Color, UA yellow yellow   Clarity, UA cloudy (A) clear   Glucose, UA negative negative mg/dL   Bilirubin, UA negative negative   Ketones, POC UA negative negative mg/dL   Spec Grav, UA >=8.969 (A) 1.010 - 1.025   Blood, UA moderate (A) negative   pH, UA 5.5 5.0 - 8.0   Protein Ur, POC negative negative mg/dL   Urobilinogen, UA 0.2 0.2 or 1.0 E.U./dL   Nitrite, UA Negative Negative   Leukocytes, UA Negative Negative  CBC with Differential     Status: Abnormal   Collection Time: 11/14/23  8:37 PM  Result Value Ref Range   WBC 6.7 4.0 - 10.5 K/uL   RBC 3.94 3.87 - 5.11 MIL/uL   Hemoglobin 10.6 (L) 12.0 - 15.0 g/dL   HCT 66.1 (L) 63.9 - 53.9 %   MCV 85.8 80.0 - 100.0 fL   MCH 26.9 26.0 - 34.0 pg   MCHC 31.4 30.0 - 36.0 g/dL   RDW 84.6 88.4 - 84.4 %   Platelets 302 150 - 400 K/uL   nRBC 0.0 0.0 - 0.2 %   Neutrophils Relative % 54 %   Neutro Abs 3.6 1.7 - 7.7 K/uL   Lymphocytes Relative 36 %   Lymphs Abs 2.4 0.7 - 4.0 K/uL   Monocytes Relative 9 %   Monocytes Absolute 0.6 0.1 - 1.0 K/uL   Eosinophils Relative 1 %   Eosinophils Absolute 0.1 0.0 - 0.5 K/uL   Basophils Relative 0 %   Basophils Absolute 0.0 0.0 - 0.1 K/uL   Immature Granulocytes 0 %   Abs Immature Granulocytes 0.03 0.00 - 0.07 K/uL    Comment: Performed at [facility], [address] [place], [place] [address]  Comprehensive metabolic panel     Status: Abnormal   Collection Time: 11/14/23  8:37 PM  Result Value Ref Range   Sodium 140 135 - 145 mmol/L   Potassium 3.8 3.5 - 5.1 mmol/L   Chloride 103 98 - 111 mmol/L   CO2 26 22 - 32 mmol/L   Glucose, Bld 118 (H) 70 - 99 mg/dL    Comment: Glucose reference range applies only to samples taken after fasting for at least 8 hours.   BUN 12 6 - 20 mg/dL   Creatinine, Ser 9.23 0.44 - 1.00 mg/dL   Calcium 9.1 8.9 - 89.6 mg/dL   Total Protein 7.2 6.5 - 8.1 g/dL   Albumin 3.4 (L) 3.5 - 5.0 g/dL   AST 14 (L) 15 -  41 U/L   ALT 14 0 - 44 U/L    Alkaline Phosphatase 60 38 - 126 U/L   Total Bilirubin 0.5 0.0 - 1.2 mg/dL   GFR, Estimated >39 >39 mL/min    Comment: (NOTE) Calculated using the CKD-EPI Creatinine Equation (2021)    Anion gap 11 5 - 15    Comment: Performed at [facility], [address] [place], [place] [address]  Lipase, blood     Status: None   Collection Time: 11/14/23  8:37 PM  Result Value Ref Range   Lipase 30 11 - 51 U/L    Comment: Performed at [facility], [address], [place], [place] [address]  hCG, serum, qualitative     Status: None   Collection Time: 11/14/23  8:37 PM  Result Value Ref Range   Preg, Serum NEGATIVE NEGATIVE    Comment:        THE SENSITIVITY OF THIS METHODOLOGY IS >10 mIU/mL. Performed at [facility], [address], [place], [place] [address]   Urine Culture     Status: Abnormal   Collection Time: 11/14/23 10:15 PM   Specimen: Urine, Clean Catch  Result Value Ref Range   Specimen Description      URINE, CLEAN CATCH Performed at [facility], [address] [place], [place] [address]    Special Requests      NONE Performed at [facility], [address], [place], [place] [address]    Culture MULTIPLE SPECIES PRESENT, SUGGEST RECOLLECTION (A)    Report Status 11/15/2023 FINAL   Urinalysis, Routine w reflex microscopic -Urine, Clean Catch     Status: Abnormal   Collection Time: 11/14/23 10:15 PM  Result Value Ref Range   Color, Urine YELLOW YELLOW   APPearance HAZY (A) CLEAR   Specific Gravity, Urine 1.029 1.005 - 1.030   pH 5.0 5.0 - 8.0   Glucose, UA NEGATIVE NEGATIVE mg/dL   Hgb urine dipstick SMALL (A) NEGATIVE   Bilirubin Urine NEGATIVE NEGATIVE   Ketones, ur NEGATIVE NEGATIVE mg/dL   Protein, ur NEGATIVE NEGATIVE mg/dL   Nitrite NEGATIVE NEGATIVE   Leukocytes,Ua NEGATIVE NEGATIVE   RBC / HPF 6-10 0 - 5 RBC/hpf   WBC, UA 0-5 0 - 5 WBC/hpf   Bacteria, UA RARE (A) NONE SEEN   Squamous Epithelial / HPF 11-20 0 - 5 /HPF   Mucus PRESENT    Ca Oxalate Crys, UA  PRESENT     Comment: Performed at [facility], [address], [place], [place] [address]      Assessment & Plan:   Umbilical hernia Constipation HTN  Plan- GI referral for follow up of umbilical hernia from ER findings and abdominal pain  No follow-ups on file.   Total time spent: 20 minutes  Neale Carpen, NP  11/24/2023   This document may have been prepared by [facility] Voice Recognition software and as such may include unintentional dictation errors.

## 2023-11-24 NOTE — Patient Instructions (Signed)
 1) Consider Urology referral for nocturnal urination 2) GI referral - umbilical hernia, radiating pain to right lower back 3) Follow up prn

## 2023-11-25 ENCOUNTER — Ambulatory Visit (INDEPENDENT_AMBULATORY_CARE_PROVIDER_SITE_OTHER)

## 2023-11-25 ENCOUNTER — Other Ambulatory Visit: Admitting: Psychology

## 2023-11-25 DIAGNOSIS — L501 Idiopathic urticaria: Secondary | ICD-10-CM

## 2023-11-25 NOTE — Progress Notes (Signed)
 Immunotherapy   Patient Details  Name: Jessica Sanchez MRN: [id number] Date of Birth: Feb 27, 1986  11/25/2023  Glade LITTIE Daring started injections for  Xolair  Following schedule: Every twenty eight days.  Frequency:Every four weeks. Epi-Pen:Epi-Pen Available , reminded to bring it with her to further injection appointments.  Consent signed in office and patient instructions given. Patient sat in the lobby for thirty minutes without an issue.    Santana DELENA Eck 11/25/2023, 1:53 PM

## 2023-11-28 NOTE — Progress Notes (Signed)
 DAP note              Client name: Jessica Sanchez Therapist name: Jessica Sanchez Jessica Sanchez Date: 09/30/2023 Time: 60 mins   Data: Client reports that she is doing well. She reports that talking out all of the things that have happened and continue to happen have been very helpful to the client. She reports that she reflects and moves forward. She stated that she spent so much time pushing everything down that she is happy she can let it out. Client reports depressive symptoms are at a 4 and would like to get that to a 2 with the use of interventions.   Assessment: The client presented much more clear headed. She is feeling better, not only mentally but also physically. It is showing that she is really trying to put in the work to heal. She has her good days and she has her bad days but it is clear that she is using the tools to prevent herself from getting stuck.    Plan: The client will attend bi-weekly sessions with a goal of attending at least 2 per month. Client will take a moment of mindfulness daily and utilize box breathing as needed to reduce symptoms. Work on healing past trauma by figuring out triggers, to help with symptom reduction, emotional regulation, and processing.

## 2023-11-30 ENCOUNTER — Encounter: Payer: Self-pay | Admitting: Internal Medicine

## 2023-12-05 ENCOUNTER — Encounter (HOSPITAL_COMMUNITY): Payer: Self-pay | Admitting: *Deleted

## 2023-12-05 ENCOUNTER — Ambulatory Visit: Payer: Self-pay

## 2023-12-05 ENCOUNTER — Emergency Department (HOSPITAL_COMMUNITY)
Admission: EM | Admit: 2023-12-05 | Discharge: 2023-12-05 | Disposition: A | Discharge status: Home / Self Care | Attending: Emergency Medicine | Admitting: Emergency Medicine

## 2023-12-05 ENCOUNTER — Other Ambulatory Visit: Payer: Self-pay

## 2023-12-05 DIAGNOSIS — R1030 Lower abdominal pain, unspecified: Secondary | ICD-10-CM | POA: Insufficient documentation

## 2023-12-05 DIAGNOSIS — Z7901 Long term (current) use of anticoagulants: Secondary | ICD-10-CM | POA: Insufficient documentation

## 2023-12-05 DIAGNOSIS — R110 Nausea: Secondary | ICD-10-CM | POA: Diagnosis not present

## 2023-12-05 DIAGNOSIS — R319 Hematuria, unspecified: Secondary | ICD-10-CM | POA: Insufficient documentation

## 2023-12-05 DIAGNOSIS — R1031 Right lower quadrant pain: Secondary | ICD-10-CM | POA: Diagnosis not present

## 2023-12-05 DIAGNOSIS — G4733 Obstructive sleep apnea (adult) (pediatric): Secondary | ICD-10-CM | POA: Diagnosis not present

## 2023-12-05 LAB — URINALYSIS, ROUTINE W REFLEX MICROSCOPIC
Bacteria, UA: NONE SEEN
Bilirubin Urine: NEGATIVE
Glucose, UA: NEGATIVE mg/dL
Ketones, ur: NEGATIVE mg/dL
Leukocytes,Ua: NEGATIVE
Nitrite: NEGATIVE
Protein, ur: NEGATIVE mg/dL
Specific Gravity, Urine: 1.016 (ref 1.005–1.030)
pH: 6 (ref 5.0–8.0)

## 2023-12-05 LAB — CBC
HCT: 34.9 % — ABNORMAL LOW (ref 36.0–46.0)
Hemoglobin: 11 g/dL — ABNORMAL LOW (ref 12.0–15.0)
MCH: 27 pg (ref 26.0–34.0)
MCHC: 31.5 g/dL (ref 30.0–36.0)
MCV: 85.7 fL (ref 80.0–100.0)
Platelets: 330 K/uL (ref 150–400)
RBC: 4.07 MIL/uL (ref 3.87–5.11)
RDW: 15.1 % (ref 11.5–15.5)
WBC: 5.8 K/uL (ref 4.0–10.5)
nRBC: 0 % (ref 0.0–0.2)

## 2023-12-05 LAB — COMPREHENSIVE METABOLIC PANEL WITH GFR
ALT: 11 U/L (ref 0–44)
AST: 10 U/L — ABNORMAL LOW (ref 15–41)
Albumin: 3.5 g/dL (ref 3.5–5.0)
Alkaline Phosphatase: 61 U/L (ref 38–126)
Anion gap: 10 (ref 5–15)
BUN: 11 mg/dL (ref 6–20)
CO2: 25 mmol/L (ref 22–32)
Calcium: 9 mg/dL (ref 8.9–10.3)
Chloride: 102 mmol/L (ref 98–111)
Creatinine, Ser: 0.62 mg/dL (ref 0.44–1.00)
GFR, Estimated: >60 mL/min (ref >60)
Glucose, Bld: 87 mg/dL (ref 70–99)
Potassium: 4.1 mmol/L (ref 3.5–5.1)
Sodium: 137 mmol/L (ref 135–145)
Total Bilirubin: 0.4 mg/dL (ref 0.0–1.2)
Total Protein: 6.8 g/dL (ref 6.5–8.1)

## 2023-12-05 LAB — LIPASE, BLOOD: Lipase: 25 U/L (ref 11–51)

## 2023-12-05 MED ORDER — HYDROCODONE-ACETAMINOPHEN 5-325 MG PO TABS: 1.0000 | ORAL_TABLET | ORAL | 0 refills | Status: DC | PRN

## 2023-12-05 MED ORDER — HYDROCODONE-ACETAMINOPHEN 5-325 MG PO TABS
1.0000 | ORAL_TABLET | Freq: Once | ORAL | Status: AC
  Administered 2023-12-05: 1 via ORAL
  Filled 2023-12-05: qty 1

## 2023-12-05 NOTE — Telephone Encounter (Signed)
 Patient advised to go to  ER

## 2023-12-05 NOTE — Telephone Encounter (Signed)
 FYI Only or Action Required?: FYI only for provider.  Patient was last seen in primary care on 11/24/2023 by Glennon Sand, NP.  Called Nurse Triage reporting Abdominal Pain.  Symptoms began several weeks ago.  Interventions attempted: OTC medications: pain relief and Rest, hydration, or home remedies.  Symptoms are: rapidly worsening.  Triage Disposition: Go to ED Now (Notify PCP)  Patient/caregiver understands and will follow disposition?: Yes  Copied from CRM [id number]. Topic: Clinical - Red Word Triage >> Dec 05, 2023 12:06 PM Everette C wrote: [facility] that prompted transfer to Nurse Triage: The patient is experiencing significant discomfort near their belly button that began yesterday 12/04/23 Reason for Disposition  [1] SEVERE pain (e.g., excruciating) AND [2] present > 1 hour  Answer Assessment - Initial Assessment Questions 1. LOCATION: Where does it hurt?      Near umbilical  2. RADIATION: Does the pain shoot anywhere else? (e.g., chest, back)     no 3. ONSET: When did the pain begin? (e.g., minutes, hours or days ago)      12/04/23 4. SUDDEN: Gradual or sudden onset?     Sudden 5. PATTERN Does the pain come and go, or is it constant?     Constant 6. SEVERITY: How bad is the pain?  (e.g., Scale 1-10; mild, moderate, or severe)     10/10 unable to walk 7. RECURRENT SYMPTOM: Have you ever had this type of stomach pain before? If Yes, ask: When was the last time? and What happened that time?      No 8. CAUSE: What do you think is causing the stomach pain? (e.g., gallstones, recent abdominal surgery)     Unsure, states she has been told she has a hernia but this doesn't feel like the same pain 9. RELIEVING/AGGRAVATING FACTORS: What makes it better or worse? (e.g., antacids, bending or twisting motion, bowel movement)     Nothing helps.  10. OTHER SYMPTOMS: Do you have any other symptoms? (e.g., back pain, diarrhea, fever, urination pain,  vomiting)  Additional info: Referred to GI, has not had initial appointment as of this time. Pain worsening and unable to tolerate, unable to walk due to extreme pain.  Protocols used: Abdominal Pain - Female-A-AH

## 2023-12-05 NOTE — ED Triage Notes (Signed)
 Pt with lower abd pain since last night.  C/o generalized weakness, denies burning with urination. + nausea, denies emesis or diarrhea.

## 2023-12-05 NOTE — ED Provider Notes (Signed)
 [place] EMERGENCY DEPARTMENT AT [facility] Provider Note   CSN: [id number] Arrival date & time: 12/05/23  1433     Patient presents with: Abdominal Pain   Jessica Sanchez is a 38 y.o. female.   Patient to ED with complaint of abdominal pain in the hypogastric abdomen for 2 days. She has ongoing lower abdominal pain for months which can be in differing locations. With this episode, she has mild nausea without vomiting. No fever. She states ongoing issues with constipation but not necessarily worse over the last 2 days. She denies urinary symptoms. No flank pain. She took ibuprofen  for pain without relief during the night and has not taken anything else.    The history is provided by the patient. No language interpreter was used.  Abdominal Pain      Prior to Admission medications   Medication Sig Start Date End Date Taking? Authorizing Provider  HYDROcodone -acetaminophen  (NORCO/VICODIN) 5-325 MG tablet Take 1 tablet by mouth every 4 (four) hours as needed. 12/05/23  Yes Zubin Pontillo, Margit, PA-C  albuterol  (VENTOLIN  HFA) 108 (90 Base) MCG/ACT inhaler Inhale 2 puffs into the lungs every 6 (six) hours as needed. 06/27/23   Leath-Warren, Etta PARAS, NP  amlodipine -olmesartan  (AZOR ) 10-20 MG tablet TAKE ONE TABLET BY MOUTH EVERY DAY 10/04/23   Zarwolo, Gloria, FNP  Blood Glucose Monitoring Suppl DEVI 1 each by Does not apply route in the morning, at noon, and at bedtime. May substitute to any manufacturer covered by patient's insurance. 01/11/23   Zarwolo, Gloria, FNP  diazepam  (VALIUM ) 5 MG tablet Take 1 tablet (5 mg total) by mouth once as needed for up to 1 dose for anxiety (take 15-30 minutes before procedure). 10/17/23   Bevely Doffing, FNP  EPINEPHrine  (EPIPEN  2-PAK) 0.3 mg/0.3 mL IJ SOAJ injection Inject 0.3 mg into the muscle as needed for anaphylaxis. 02/16/23   Iva Marty Saltness, MD  famotidine  (PEPCID ) 20 MG tablet Take 1 tablet (20 mg total) by mouth 2 (two) times daily. Take  one tablet twice daily for two days 10/24/23   Garrick Charleston, MD  famotidine  (PEPCID ) 40 MG tablet Take 1 tablet (40 mg total) by mouth in the morning and at bedtime. 02/02/23   Iva Marty Saltness, MD  Fluticasone -Umeclidin-Vilant (TRELEGY ELLIPTA ) 200-62.5-25 MCG/ACT AEPB Inhale 1 puff into the lungs daily. 07/22/23   Cari Arlean HERO, FNP  gabapentin  (NEURONTIN ) 300 MG capsule Take 1 capsule (300 mg total) by mouth at bedtime. 06/13/23   Zarwolo, Gloria, FNP  ibuprofen  (ADVIL ) 800 MG tablet Take 1 tablet (800 mg total) by mouth 3 (three) times daily. 11/14/23   Leath-Warren, Etta PARAS, NP  levocetirizine (XYZAL ) 5 MG tablet Take 1 tablet (5 mg total) by mouth 2 (two) times daily as needed for allergies (hives/itching). 07/22/23   Cari Arlean HERO, FNP  metFORMIN  (GLUCOPHAGE ) 1000 MG tablet TAKE ONE-HALF TABLET BY MOUTH TWICE DAILY WITH A MEAL Patient taking differently: Take 250 mg by mouth 2 (two) times daily. 11/07/23   Zarwolo, Gloria, FNP  montelukast  (SINGULAIR ) 10 MG tablet Take 1 tablet (10 mg total) by mouth at bedtime. 11/07/23   Cari Arlean HERO, FNP  Multiple Vitamins-Calcium (ONE-A-DAY WOMENS PO) Take 1 tablet by mouth daily.    [provider]  NON FORMULARY Pt uses a cpap nightly    [provider]  omalizumab  (XOLAIR ) 300 MG/2  ML prefilled syringe Inject 300 mg into the skin every 28 (twenty-eight) days. 11/10/23   Iva Marty Saltness, MD  ondansetron  (ZOFRAN ) 4 MG tablet Take 1 tablet (4 mg total) by mouth every 6 (six) hours. 11/14/23   Robinson, John K, PA-C  ondansetron  (ZOFRAN -ODT) 4 MG disintegrating tablet Take 1 tablet (4 mg total) by mouth every 8 (eight) hours as needed for nausea or vomiting. 09/20/23   Yolande Lamar BROCKS, MD  predniSONE  (DELTASONE ) 20 MG tablet Take 2 tablets (40 mg total) by mouth daily with breakfast. For the next four days Patient not taking: Reported on 11/24/2023 10/24/23   Garrick Lamar, MD  sucralfate  (CARAFATE ) 1 g tablet Take 1 tablet (1 g  total) by mouth 4 (four) times daily -  with meals and at bedtime. 10/24/23   Garrick Lamar, MD  SUMAtriptan  (IMITREX ) 100 MG tablet TAKE ONE TABLET BY MOUTH EVERY 2 HOURS AS NEEDED FOR MIGRAINE. MAY REPEAT IN 2 HOURS IF HEADACHE PERSISTS OR RECURS Patient taking differently: every 2 (two) hours as needed. 07/28/23   Zarwolo, Gloria, FNP  tamsulosin  (FLOMAX ) 0.4 MG CAPS capsule Take 1 capsule (0.4 mg total) by mouth daily after supper. 11/14/23   Leath-Warren, Etta PARAS, NP  tirzepatide  (ZEPBOUND ) 2.5 MG/0.5ML injection vial Inject 2.5 mg into the skin once a week. 10/05/23   Bevely Doffing, FNP  triamcinolone  ointment (KENALOG ) 0.1 % Apply twice daily for flare ups below neck, maximum 10 days. 04/11/23   Tobie Arleta SQUIBB, MD  Vitamin D , Ergocalciferol , (DRISDOL ) 1.25 MG (50000 UNIT) CAPS capsule Take 1 capsule (50,000 Units total) by mouth every 7 (seven) days. 06/27/23   Zarwolo, Gloria, FNP  apixaban  (ELIQUIS ) 5 MG TABS tablet Take 2 tablets (10mg ) twice daily for 7 days, then 1 tablet (5mg ) twice daily 04/20/19 04/20/19  Meryle Ip A, PA-C  levonorgestrel  (MIRENA ) 20 MCG/24HR IUD 1 each by Intrauterine route once.   06/25/20  [provider]    Allergies: Shellfish allergy , Sulfa  antibiotics, and Adhesive [tape]    Review of Systems  Gastrointestinal:  Positive for abdominal pain.    Updated Vital Signs BP 113/64 (BP Location: Right Arm)   Pulse 77   Temp 98.2 F (36.8 C) (Oral)   Resp 18   Ht 5' 1 (1.549 m)   Wt (!) 156.5 kg   LMP 12/26/2022 (Approximate)   SpO2 100%   BMI 65.19 kg/m   Physical Exam Vitals and nursing note reviewed.  Constitutional:      General: She is not in acute distress.    Appearance: She is well-developed. She is obese. She is not ill-appearing.  Pulmonary:     Effort: Pulmonary effort is normal.  Abdominal:     General: Bowel sounds are normal.     Palpations: Abdomen is soft.     Tenderness: There is abdominal tenderness in the right lower  quadrant, suprapubic area and left lower quadrant. There is no right CVA tenderness or left CVA tenderness.  Musculoskeletal:        General: Normal range of motion.     Cervical back: Normal range of motion.  Skin:    General: Skin is warm and dry.  Neurological:     Mental Status: She is alert and oriented to person, place, and time.     (all labs ordered are listed, but only abnormal results are displayed) Labs Reviewed  COMPREHENSIVE METABOLIC PANEL WITH GFR - Abnormal; Notable for the following components:      Result Value   AST 10 (*)    All other components within normal limits  CBC - Abnormal; Notable for  the following components:   Hemoglobin 11.0 (*)    HCT 34.9 (*)    All other components within normal limits  URINALYSIS, ROUTINE W REFLEX MICROSCOPIC - Abnormal; Notable for the following components:   Color, Urine STRAW (*)    Hgb urine dipstick MODERATE (*)    All other components within normal limits  LIPASE, BLOOD    EKG: None  Radiology: No results found.   Procedures   Medications Ordered in the ED  HYDROcodone -acetaminophen  (NORCO/VICODIN) 5-325 MG per tablet 1 tablet (has no administration in time range)    Clinical Course as of 12/05/23 1852  Mon Dec 05, 2023  1803 Morbidly obese patient with recurrent/chronic lower abdominal pain. Last visits to ED for same include 7/14, 7/7 and 5/20 with CT abd/pel on 5/20 and 7/14 which were negative. Today, no fever, no leukocytosis, no evidence UTI. She does show microscopic hematuria but both above san were negative for kidney stones. She has GI follow up scheduled for next week (Aug 13). I do not feel a repeat CT scan is indicated tonight. [SU]  1836 Patient is comfortable without further CT imaging. She is requesting something for pain. Database reviewed. A limited # Norco is felt appropriate for comfort. She is encouraged to follow up with her already scheduled appointment next week with GI. Return precautions  discussed.  [SU]    Clinical Course User Index [SU] Odell Balls, PA-C                                 Medical Decision Making Amount and/or Complexity of Data Reviewed Labs: ordered.        Final diagnoses:  Lower abdominal pain    ED Discharge Orders          Ordered    HYDROcodone -acetaminophen  (NORCO/VICODIN) 5-325 MG tablet  Every 4 hours PRN        12/05/23 1850               Odell Balls, PA-C 12/05/23 1852    Garrick Charleston, MD 12/05/23 2251

## 2023-12-05 NOTE — Discharge Instructions (Signed)
 As we discussed, no further imaging was done today. Please keep your scheduled appointment with GI for the 13th. If you have worsening or severe pain, uncontrolled vomiting, high fever or new concern, come back to the emergency department for further management.

## 2023-12-08 ENCOUNTER — Ambulatory Visit: Admitting: Family Medicine

## 2023-12-09 ENCOUNTER — Ambulatory Visit: Payer: Self-pay

## 2023-12-09 NOTE — Telephone Encounter (Signed)
 FYI Only or Action Required?: FYI only for provider.  Patient was last seen in primary care on 11/24/2023 by Glennon Sand, NP.  Called Nurse Triage reporting Chest Pain and Shortness of Breath.  Symptoms began today.  Interventions attempted: Rest, hydration, or home remedies.  Symptoms are: unchanged.  Triage Disposition: Call EMS 911 Now  Patient/caregiver understands and will follow disposition?: Yes      Copied from CRM #[id number]. Topic: Clinical - Red Word Triage >> Dec 09, 2023  4:49 PM Tiffany S wrote: Red Word that prompted transfer to Nurse Triage: Chest pain feeling like she is about pass out shortness of breath Reason for Disposition  Sounds like a life-threatening emergency to the triager  Answer Assessment - Initial Assessment Questions 1. LOCATION: Where does it hurt?       Middle towards R side by breast BP 171/88 2. RADIATION: Does the pain go anywhere else? (e.g., into neck, jaw, arms, back)     R side feels off - feels like she may pass out 3. ONSET: When did the chest pain begin? (Minutes, hours or days)      Today after work 4. PATTERN: Does the pain come and go, or has it been constant since it started?  Does it get worse with exertion?      *No Answer* 5. DURATION: How long does it last (e.g., seconds, minutes, hours)     *No Answer* 6. SEVERITY: How bad is the pain?  (e.g., Scale 1-10; mild, moderate, or severe)     *No Answer* 7. CARDIAC RISK FACTORS: Do you have any history of heart problems or risk factors for heart disease? (e.g., angina, prior heart attack; diabetes, high blood pressure, high cholesterol, smoker, or strong family history of heart disease)     *No Answer* 8. PULMONARY RISK FACTORS: Do you have any history of lung disease?  (e.g., blood clots in lung, asthma, emphysema, birth control pills)     *No Answer* 9. CAUSE: What do you think is causing the chest pain?     *No Answer* 10. OTHER SYMPTOMS: Do you  have any other symptoms? (e.g., dizziness, nausea, vomiting, sweating, fever, difficulty breathing, cough)       SOB 11. PREGNANCY: Is there any chance you are pregnant? When was your last menstrual period?       *No Answer*  Protocols used: Chest Pain-A-AH

## 2023-12-12 DIAGNOSIS — R32 Unspecified urinary incontinence: Secondary | ICD-10-CM | POA: Diagnosis not present

## 2023-12-13 ENCOUNTER — Other Ambulatory Visit: Payer: Self-pay

## 2023-12-14 ENCOUNTER — Encounter: Payer: Self-pay | Admitting: Internal Medicine

## 2023-12-14 ENCOUNTER — Ambulatory Visit: Admitting: Internal Medicine

## 2023-12-14 ENCOUNTER — Other Ambulatory Visit: Admitting: Psychology

## 2023-12-14 VITALS — BP 137/85 | HR 84 | Temp 97.8°F | Ht 61.0 in | Wt 355.7 lb

## 2023-12-14 DIAGNOSIS — R1030 Lower abdominal pain, unspecified: Secondary | ICD-10-CM

## 2023-12-14 DIAGNOSIS — K5909 Other constipation: Secondary | ICD-10-CM | POA: Diagnosis not present

## 2023-12-14 DIAGNOSIS — R109 Unspecified abdominal pain: Secondary | ICD-10-CM

## 2023-12-14 DIAGNOSIS — K429 Umbilical hernia without obstruction or gangrene: Secondary | ICD-10-CM

## 2023-12-14 DIAGNOSIS — G4733 Obstructive sleep apnea (adult) (pediatric): Secondary | ICD-10-CM | POA: Diagnosis not present

## 2023-12-14 DIAGNOSIS — K5900 Constipation, unspecified: Secondary | ICD-10-CM

## 2023-12-14 DIAGNOSIS — K219 Gastro-esophageal reflux disease without esophagitis: Secondary | ICD-10-CM | POA: Diagnosis not present

## 2023-12-14 MED ORDER — FAMOTIDINE 20 MG PO TABS: 20.0000 mg | ORAL_TABLET | Freq: Two times a day (BID) | ORAL | 11 refills | Status: DC

## 2023-12-14 MED ORDER — PANTOPRAZOLE SODIUM 40 MG PO TBEC: 40.0000 mg | DELAYED_RELEASE_TABLET | Freq: Every day | ORAL | 11 refills | Status: DC

## 2023-12-14 NOTE — Progress Notes (Signed)
 Primary Care Physician:  Zarwolo, Gloria, FNP Primary Gastroenterologist:  Dr. Cindie  Chief Complaint  Patient presents with   New Patient (Initial Visit)    Patient here today as a new patient due to having abdominal pain and an umbilical hernia.    HPI:   Jessica Sanchez is a 38 y.o. female who presents to clinic today by referral from her PCP Neale Carpen for evaluation.  Multiple GI complaints for me today.  Chronic GERD: Currently taking famotidine  20 mg twice daily.  Having breakthrough symptoms of heartburn and reflux, mild to moderate.  Denies any dysphagia odynophagia.  No epigastric or chest pain.  Abdominal pain: Reports intermittent abdominal pain, primarily lower, intermittent, improved with bowel movements.  She had a CT abdomen pelvis with contrast 11/14/2023 which I personally reviewed which showed small fat-containing umbilical hernia, otherwise unremarkable.  Chronic constipation: On average will have a BM twice a week though sometimes will go up to a week without having 1.  Has tried multiple over-the-counter laxatives without improvement in her symptoms.  Denies any melena hematochezia.  No family history of colorectal malignancy.    Past Medical History:  Diagnosis Date   Angio-edema    Anxiety    Arthritis    right shoulder   Asthma    Diabetes mellitus without complication (HCC)    Dyspnea    Dysrhythmia    hx of SVT   Fibroid (bleeding) (uterine)    Fibroid, uterine    H/O metrorrhagia    Hypertension    IUD (intrauterine device) in place 07/11/2015   Menorrhagia    Obesity, Class III, BMI 40-49.9 (morbid obesity) 11/11/2018   Sleep apnea    uses CPAP - setting 11   SVT (supraventricular tachycardia) (HCC)     Past Surgical History:  Procedure Laterality Date   DILATATION AND CURETTAGE/HYSTEROSCOPY WITH MINERVA N/A 12/01/2021   Procedure: DILATATION AND CURETTAGE/HYSTEROSCOPY WITH MINERVA;  Surgeon: Jayne Vonn DEL, MD;  Location: [facility];   Service: Gynecology;  Laterality: N/A;   HYSTERECTOMY ABDOMINAL WITH SALPINGECTOMY Bilateral 01/19/2023   Procedure: HYSTERECTOMY ABDOMINAL WITH SALPINGECTOMY;  Surgeon: Jayne Vonn DEL, MD;  Location: [facility];  Service: Gynecology;  Laterality: Bilateral;   HYSTEROSCOPY N/A 09/17/2020   Procedure: HYSTEROSCOPY;  Surgeon: Jayne Vonn DEL, MD;  Location: [facility];  Service: Gynecology;  Laterality: N/A;   HYSTEROSCOPY WITH D & C N/A 09/27/2013   Procedure: DILATATION AND CURETTAGE /HYSTEROSCOPY and insertion of Mirena  IUD ;  Surgeon: Marjorie DEL. Okey, MD;  Location: [facility];  Service: Gynecology;  Laterality: N/A;   IUD REMOVAL N/A 09/17/2020   Procedure: INTRAUTERINE DEVICE (IUD) REMOVAL (2);  Surgeon: Jayne Vonn DEL, MD;  Location: [facility];  Service: Gynecology;  Laterality: N/A;   LAPAROSCOPY N/A 01/19/2023   Procedure: LAPAROSCOPY DIAGNOSTIC;  Surgeon: Jayne Vonn DEL, MD;  Location: [facility];  Service: Gynecology;  Laterality: N/A;   MYOMECTOMY  02/03/2011   Procedure: MYOMECTOMY;  Surgeon: Vonn DEL Jayne, MD;  Location: [facility];  Service: Gynecology;  Laterality: N/A;   SVT ABLATION N/A 11/08/2018   Procedure: SVT ABLATION;  Surgeon: Waddell Danelle ORN, MD;  Location: [facility] CV [facility];  Service: Cardiovascular;  Laterality: N/A;   SVT ABLATION N/A 12/26/2020   Procedure: SVT ABLATION;  Surgeon: Waddell Danelle ORN, MD;  Location: [facility] INVASIVE CV LAB;  Service: Cardiovascular;  Laterality: N/A;   TONSILLECTOMY     TYMPANOSTOMY TUBE PLACEMENT      Current Outpatient  Medications  Medication Sig Dispense Refill   albuterol  (VENTOLIN  HFA) 108 (90 Base) MCG/ACT inhaler Inhale 2 puffs into the lungs every 6 (six) hours as needed. 8 g 0   amlodipine -olmesartan  (AZOR ) 10-20 MG tablet TAKE ONE TABLET BY MOUTH EVERY DAY 30 tablet 1   Blood Glucose Monitoring Suppl DEVI 1 each by Does not apply route in the morning, at noon, and at bedtime. May substitute to any manufacturer covered by patient's insurance. 1 each 0    EPINEPHrine  (EPIPEN  2-PAK) 0.3 mg/0.3 mL IJ SOAJ injection Inject 0.3 mg into the muscle as needed for anaphylaxis. 2 each 1   Fluticasone -Umeclidin-Vilant (TRELEGY ELLIPTA ) 200-62.5-25 MCG/ACT AEPB Inhale 1 puff into the lungs daily. 28 each 5   gabapentin  (NEURONTIN ) 300 MG capsule Take 1 capsule (300 mg total) by mouth at bedtime. 90 capsule 3   HYDROcodone -acetaminophen  (NORCO/VICODIN) 5-325 MG tablet Take 1 tablet by mouth every 4 (four) hours as needed. 6 tablet 0   ibuprofen  (ADVIL ) 800 MG tablet Take 1 tablet (800 mg total) by mouth 3 (three) times daily. 21 tablet 0   levocetirizine (XYZAL ) 5 MG tablet Take 1 tablet (5 mg total) by mouth 2 (two) times daily as needed for allergies (hives/itching). 60 tablet 5   metFORMIN  (GLUCOPHAGE ) 1000 MG tablet TAKE ONE-HALF TABLET BY MOUTH TWICE DAILY WITH A MEAL (Patient taking differently: Take 500 mg by mouth 2 (two) times daily with a meal.) 30 tablet 7   montelukast  (SINGULAIR ) 10 MG tablet Take 1 tablet (10 mg total) by mouth at bedtime. 30 tablet 2   Multiple Vitamins-Calcium (ONE-A-DAY WOMENS PO) Take 1 tablet by mouth daily.     NON FORMULARY Pt uses a cpap nightly     omalizumab  (XOLAIR ) 300 MG/2  ML prefilled syringe Inject 300 mg into the skin every 28 (twenty-eight) days. 2 mL 11   ondansetron  (ZOFRAN -ODT) 4 MG disintegrating tablet Take 1 tablet (4 mg total) by mouth every 8 (eight) hours as needed for nausea or vomiting. 12 tablet 0   pantoprazole  (PROTONIX ) 40 MG tablet Take 1 tablet (40 mg total) by mouth daily. 30 tablet 11   sucralfate  (CARAFATE ) 1 g tablet Take 1 tablet (1 g total) by mouth 4 (four) times daily -  with meals and at bedtime. 28 tablet 0   SUMAtriptan  (IMITREX ) 100 MG tablet TAKE ONE TABLET BY MOUTH EVERY 2 HOURS AS NEEDED FOR MIGRAINE. MAY REPEAT IN 2 HOURS IF HEADACHE PERSISTS OR RECURS (Patient taking differently: Take 100 mg by mouth as needed.) 10 tablet 2   tamsulosin  (FLOMAX ) 0.4 MG CAPS capsule Take 1 capsule  (0.4 mg total) by mouth daily after supper. 30 capsule 0   triamcinolone  ointment (KENALOG ) 0.1 % Apply twice daily for flare ups below neck, maximum 10 days. (Patient taking differently: as needed. Apply twice daily for flare ups below neck, maximum 10 days.) 80 g 3   Vitamin D , Ergocalciferol , (DRISDOL ) 1.25 MG (50000 UNIT) CAPS capsule Take 1 capsule (50,000 Units total) by mouth every 7 (seven) days. 20 capsule 1   diazepam  (VALIUM ) 5 MG tablet Take 1 tablet (5 mg total) by mouth once as needed for up to 1 dose for anxiety (take 15-30 minutes before procedure). (Patient not taking: Reported on 12/14/2023) 2 tablet 0   famotidine  (PEPCID ) 20 MG tablet Take 1 tablet (20 mg total) by mouth 2 (two) times daily. Take one tablet twice daily for two days 60 tablet 11   predniSONE  (DELTASONE ) 20 MG  tablet Take 2 tablets (40 mg total) by mouth daily with breakfast. For the next four days (Patient not taking: Reported on 12/14/2023) 8 tablet 0   tirzepatide  (ZEPBOUND ) 2.5 MG/0.5ML injection vial Inject 2.5 mg into the skin once a week. (Patient not taking: Reported on 12/14/2023) 2 mL 1   Current Facility-Administered Medications  Medication Dose Route Frequency Provider Last Rate Last Admin   omalizumab  (XOLAIR ) prefilled syringe 300 mg  300 mg Subcutaneous Q28 days Iva Marty Saltness, MD   300 mg at 11/25/23 1352    Allergies as of 12/14/2023 - Review Complete 12/14/2023  Allergen Reaction Noted   Shellfish allergy  Shortness Of Breath, Itching, and Swelling 06/14/2016   Sulfa  antibiotics Shortness Of Breath, Itching, and Swelling 06/14/2016   Adhesive [tape] Itching 06/09/2017    Family History  Problem Relation Age of Onset   Cirrhosis Mother    Diabetes Mother    Cancer Maternal Grandfather    Hypertension Sister    Diabetes Sister    Anesthesia problems Neg Hx    Hypotension Neg Hx    Malignant hyperthermia Neg Hx    Pseudochol deficiency Neg Hx     Social History   Socioeconomic  History   Marital status: Single    Spouse name: Not on file   Number of children: 0   Years of education: Not on file   Highest education level: 12th grade  Occupational History   Not on file  Tobacco Use   Smoking status: Never   Smokeless tobacco: Never   Tobacco comments:    socially  Vaping Use   Vaping status: Never Used  Substance and Sexual Activity   Alcohol use: Not Currently   Drug use: Yes    Types: Marijuana   Sexual activity: Not Currently    Birth control/protection: Surgical    Comment: hyst  Other Topics Concern   Not on file  Social History Narrative   Not on file   Social Drivers of Health   Financial Resource Strain: Low Risk  (05/15/2023)   Overall Financial Resource Strain (CARDIA)    Difficulty of Paying Living Expenses: Not very hard  Food Insecurity: No Food Insecurity (05/15/2023)   Hunger Vital Sign    Worried About Running Out of Food in the Last Year: Never true    Ran Out of Food in the Last Year: Never true  Transportation Needs: No Transportation Needs (05/15/2023)   PRAPARE - Administrator, Civil Service (Medical): No    Lack of Transportation (Non-Medical): No  Physical Activity: Sufficiently Active (05/15/2023)   Exercise Vital Sign    Days of Exercise per Week: 5 days    Minutes of Exercise per Session: 30 min  Stress: Stress Concern Present (05/15/2023)   [employer] of Occupational Health - Occupational Stress Questionnaire    Feeling of Stress : Rather much  Social Connections: Moderately Isolated (05/15/2023)   Social Connection and Isolation Panel    Frequency of Communication with Friends and Family: More than three times a week    Frequency of Social Gatherings with Friends and Family: Once a week    Attends Religious Services: More than 4 times per year    Active Member of [employer] or Organizations: No    Attends Banker Meetings: Not on file    Marital Status: Never married  Intimate Partner  Violence: Not At Risk (01/19/2023)   Humiliation, Afraid, Rape, and Kick questionnaire    Fear of  Current or Ex-Partner: No    Emotionally Abused: No    Physically Abused: No    Sexually Abused: No    Subjective: Review of Systems  Constitutional:  Negative for chills and fever.  HENT:  Negative for congestion and hearing loss.   Eyes:  Negative for blurred vision and double vision.  Respiratory:  Negative for cough and shortness of breath.   Cardiovascular:  Negative for chest pain and palpitations.  Gastrointestinal:  Positive for abdominal pain and heartburn. Negative for blood in stool, constipation, diarrhea, melena and vomiting.  Genitourinary:  Negative for dysuria and urgency.  Musculoskeletal:  Negative for joint pain and myalgias.  Skin:  Negative for itching and rash.  Neurological:  Negative for dizziness and headaches.  Psychiatric/Behavioral:  Negative for depression. The patient is not nervous/anxious.        Objective: BP 137/85 (BP Location: Left Arm, Patient Position: Sitting, Cuff Size: Large)   Pulse 84   Temp 97.8 F (36.6 C) (Temporal)   Ht 5' 1 (1.549 m)   Wt (!) 355 lb 11.2 oz (161.3 kg)   LMP 12/26/2022 (Approximate)   BMI 67.21 kg/m  Physical Exam Constitutional:      Appearance: Normal appearance. She is obese.  HENT:     Head: Normocephalic and atraumatic.  Eyes:     Extraocular Movements: Extraocular movements intact.     Conjunctiva/sclera: Conjunctivae normal.  Cardiovascular:     Rate and Rhythm: Normal rate and regular rhythm.  Pulmonary:     Effort: Pulmonary effort is normal.     Breath sounds: Normal breath sounds.  Abdominal:     General: Bowel sounds are normal.     Palpations: Abdomen is soft.  Musculoskeletal:        General: No swelling. Normal range of motion.     Cervical back: Normal range of motion and neck supple.  Skin:    General: Skin is warm and dry.     Coloration: Skin is not jaundiced.  Neurological:      General: No focal deficit present.     Mental Status: She is alert and oriented to person, place, and time.  Psychiatric:        Mood and Affect: Mood normal.        Behavior: Behavior normal.      Assessment/Plan:  1.  Chronic GERD-not ideally controlled on famotidine  twice daily.  Will start on pantoprazole  40 mg daily.  Continue famotidine  on top of this given her history of urticaria.  2.  Chronic constipation, abdominal pain-likely has a component of irritable bowel syndrome with constipation.  Not improved on OTC laxatives.  Will give samples of Linzess 145 mcg daily.  Counseled we have room to increase or decrease dose depending on how she responds.  Counseled on initial washout period.  Call with update in 1 to 2 weeks and I can send in formal prescription if improved.  3.  Umbilical hernia-small fat-containing hernia noted on CT.  Discussed in depth with patient today.  Nontender.  I do not think this is the cause of her abdominal pain.  That being said if she would like to see a surgeon to discuss fixation, I would be happy to refer her.  She would like to hold off for now.  Follow-up in 6 to 8 weeks.  Thank you Neale Carpen for the kind referral.  12/14/2023 8:51 AM   Disclaimer: This note was dictated with voice recognition software. Similar sounding words  can inadvertently be transcribed and may not be corrected upon review.

## 2023-12-14 NOTE — Patient Instructions (Signed)
 For your chronic acid reflux, I am going to start you on pantoprazole  40 mg daily.  This medication works best if you take it first thing in the morning.  Continue famotidine  twice daily on top of this.  I refilled this as well.  For your abdominal pain and constipation, I am going to give you samples of Linzess 145 mcg daily.  You may have an initial washout with this medication, continue to take it.  Let me know in a week how you are doing and I can send a formal prescription if improved.  We have room to increase or decrease dose depending on how you respond.  Follow-up in 6 to 8 weeks.  Linzess works best when taken once a day every day, on an empty stomach, at least 30 minutes before your first meal of the day.  When Linzess is taken daily as directed:  *Constipation relief is typically felt in about a week *IBS-C patients may begin to experience relief from belly pain and overall abdominal symptoms (pain, discomfort, and bloating) in about 1 week,   with symptoms typically improving over 12 weeks.  Diarrhea may occur in the first 2 weeks -keep taking it.  The diarrhea should go away and you should start having normal, complete, full bowel movements. It may be helpful to start treatment when you can be near the comfort of your own bathroom, such as a weekend.    It was very nice meeting you today.  Dr. Cindie

## 2023-12-15 ENCOUNTER — Ambulatory Visit (INDEPENDENT_AMBULATORY_CARE_PROVIDER_SITE_OTHER): Admitting: Psychology

## 2023-12-15 DIAGNOSIS — F321 Major depressive disorder, single episode, moderate: Secondary | ICD-10-CM

## 2023-12-16 ENCOUNTER — Emergency Department (HOSPITAL_COMMUNITY)
Admission: EM | Admit: 2023-12-16 | Discharge: 2023-12-16 | Disposition: A | Discharge status: Home / Self Care | Attending: Emergency Medicine | Admitting: Emergency Medicine

## 2023-12-16 ENCOUNTER — Encounter (HOSPITAL_COMMUNITY): Payer: Self-pay | Admitting: Emergency Medicine

## 2023-12-16 ENCOUNTER — Other Ambulatory Visit: Payer: Self-pay

## 2023-12-16 ENCOUNTER — Ambulatory Visit: Payer: Self-pay

## 2023-12-16 DIAGNOSIS — M5459 Other low back pain: Secondary | ICD-10-CM | POA: Diagnosis not present

## 2023-12-16 DIAGNOSIS — M545 Low back pain, unspecified: Secondary | ICD-10-CM | POA: Diagnosis present

## 2023-12-16 DIAGNOSIS — Z7901 Long term (current) use of anticoagulants: Secondary | ICD-10-CM | POA: Insufficient documentation

## 2023-12-16 LAB — URINALYSIS, ROUTINE W REFLEX MICROSCOPIC
Bacteria, UA: NONE SEEN
Bilirubin Urine: NEGATIVE
Glucose, UA: NEGATIVE mg/dL
Ketones, ur: NEGATIVE mg/dL
Leukocytes,Ua: NEGATIVE
Nitrite: NEGATIVE
Protein, ur: NEGATIVE mg/dL
Specific Gravity, Urine: 1.014 (ref 1.005–1.030)
pH: 6 (ref 5.0–8.0)

## 2023-12-16 MED ORDER — OXYCODONE HCL 5 MG PO TABS: 5.0000 mg | ORAL_TABLET | Freq: Four times a day (QID) | ORAL | 0 refills | Status: DC | PRN

## 2023-12-16 MED ORDER — METHOCARBAMOL 500 MG PO TABS
500.0000 mg | ORAL_TABLET | Freq: Once | ORAL | Status: AC
  Administered 2023-12-16: 500 mg via ORAL
  Filled 2023-12-16: qty 1

## 2023-12-16 MED ORDER — KETOROLAC TROMETHAMINE 15 MG/ML IJ SOLN
15.0000 mg | Freq: Once | INTRAMUSCULAR | Status: AC
  Administered 2023-12-16: 15 mg via INTRAMUSCULAR
  Filled 2023-12-16: qty 1

## 2023-12-16 MED ORDER — OXYCODONE HCL 5 MG PO TABS
5.0000 mg | ORAL_TABLET | Freq: Once | ORAL | Status: AC
  Administered 2023-12-16: 5 mg via ORAL
  Filled 2023-12-16: qty 1

## 2023-12-16 MED ORDER — NAPROXEN 500 MG PO TABS: 500.0000 mg | ORAL_TABLET | Freq: Two times a day (BID) | ORAL | 0 refills | Status: DC

## 2023-12-16 MED ORDER — METHOCARBAMOL 500 MG PO TABS: 500.0000 mg | ORAL_TABLET | Freq: Four times a day (QID) | ORAL | 0 refills | Status: DC | PRN

## 2023-12-16 NOTE — Telephone Encounter (Signed)
 Copied from CRM #[id number]. Topic: Clinical - Red Word Triage >> Dec 16, 2023 11:29 AM Harlene ORN wrote: Red Word that prompted transfer to Nurse Triage: yesterday, her lower back and her right side is in a lot of pain to the point she cannot sit up straight and cannot breathe. Reason for Disposition . [1] SEVERE pain (e.g., excruciating, scale 8-10) AND [2] present > 1 hour  Answer Assessment - Initial Assessment Questions 1. ONSET: When did the pain begin? (e.g., minutes, hours, days)     Yesterday - midday  2. LOCATION: Where does it hurt? (upper, mid or lower back)     Lower back Right > Left 3. SEVERITY: How bad is the pain?  (e.g., Scale 1-10; mild, moderate, or severe)     10- sudden  4. PATTERN: Is the pain constant? (e.g., yes, no; constant, intermittent)      constant 5. RADIATION: Does the pain shoot into your legs or somewhere else?     no 6. CAUSE:  What do you think is causing the back pain?      Chronic- attempted to lify mother but pt stated she was hurting prior 7. BACK OVERUSE:  Any recent lifting of heavy objects, strenuous work or exercise?     See question #6 8. MEDICINES: What have you taken so far for the pain? (e.g., nothing, acetaminophen , NSAIDS)     Acetaminophen /NSAIDS 9. NEUROLOGIC SYMPTOMS: Do you have any weakness, numbness, or problems with bowel/bladder control?     no 10. OTHER SYMPTOMS: Do you have any other symptoms? (e.g., fever, abdomen pain, burning with urination, blood in urine)       Hurts when breathe, interuptio 11. PREGNANCY: Is there any chance you are pregnant? When was your last menstrual period?       N/a  Answer Assessment - Initial Assessment Questions 1. LOCATION: Where does it hurt? (e.g., left, right)     Right lower back 2. ONSET: When did the pain start?     yest 3. SEVERITY: How bad is the pain? (e.g., Scale 1-10; mild, moderate, or severe)     10/10 4. PATTERN: Does the pain come and go, or is  it constant?      constant  6. OTHER SYMPTOMS:  Do you have any other symptoms? (e.g., fever, abdomen pain, vomiting, leg weakness, burning with urination, blood in urine)     Hurts with breathing  Protocols used: Back Pain-A-AH, Flank Pain-A-AH

## 2023-12-16 NOTE — Discharge Instructions (Addendum)
 Is a pleasure taking care of you today.  You are seen for low back pain that is worse with movement.  This is likely musculoskeletal issue.  You do not have a urinary tract infection and you do not have any blood in your urine to suggest UTI.  We are going to treat you with anti-inflammatories and muscle relaxers.  A few tablets of stronger pain medication (oxycodone ) be used in the case of severe pain.  You can also try warm shower or bath or heating pad.  Follow-up closely with your PCP.  Come back to the ER if you have any new or worsening symptoms.  You were prescribed opiate pain medication. This can have many sided effect such as drowsiness, slowed breathing, and conspitation. Do not take it with other medications that cause drowsiness, or drink alcohol while taking it. Take a stool softener over the counter while taking it.

## 2023-12-16 NOTE — ED Triage Notes (Signed)
 Pt to the ED with lower back pain that began yesterday.  Pt reports increased urination.

## 2023-12-16 NOTE — ED Provider Notes (Signed)
 [place] EMERGENCY DEPARTMENT AT [facility] Provider Note   CSN: [id number] Arrival date & time: 12/16/23  1300     Patient presents with: Back Pain   Jessica Sanchez is a 38 y.o. female.  Has PMH of obesity, SVT, uterine fibroids.  Presents ER with low back pain for the past 2 days worse with any movement.  She also is reporting urinary frequency but denies any incontinence, difficulty emptying her bladder, dysuria, fever or chills or hematuria.  No abdominal or pelvic pain, no vaginal symptoms.  She denies any heavy lifting or falls.  Denies any saddle seizure paresthesia denies bowel or bladder incontinence except for baseline occasional incontinence that has been ongoing and she has already been evaluated by PCP for this.    Back Pain      Prior to Admission medications   Medication Sig Start Date End Date Taking? Authorizing Provider  methocarbamol  (ROBAXIN ) 500 MG tablet Take 1 tablet (500 mg total) by mouth every 6 (six) hours as needed for muscle spasms. 12/16/23  Yes Chasen Mendell A, PA-C  naproxen  (NAPROSYN ) 500 MG tablet Take 1 tablet (500 mg total) by mouth 2 (two) times daily. 12/16/23  Yes Charmaine Placido A, PA-C  oxyCODONE  (ROXICODONE ) 5 MG immediate release tablet Take 1 tablet (5 mg total) by mouth every 6 (six) hours as needed. 12/16/23  Yes Shaunette Gassner A, PA-C  albuterol  (VENTOLIN  HFA) 108 (90 Base) MCG/ACT inhaler Inhale 2 puffs into the lungs every 6 (six) hours as needed. 06/27/23   Leath-Warren, Etta PARAS, NP  amlodipine -olmesartan  (AZOR ) 10-20 MG tablet TAKE ONE TABLET BY MOUTH EVERY DAY 10/04/23   Zarwolo, Gloria, FNP  Blood Glucose Monitoring Suppl DEVI 1 each by Does not apply route in the morning, at noon, and at bedtime. May substitute to any manufacturer covered by patient's insurance. 01/11/23   Zarwolo, Gloria, FNP  diazepam  (VALIUM ) 5 MG tablet Take 1 tablet (5 mg total) by mouth once as needed for up to 1 dose for anxiety (take 15-30 minutes  before procedure). Patient not taking: Reported on 12/14/2023 10/17/23   Bevely Doffing, FNP  EPINEPHrine  (EPIPEN  2-PAK) 0.3 mg/0.3 mL IJ SOAJ injection Inject 0.3 mg into the muscle as needed for anaphylaxis. 02/16/23   Iva Marty Saltness, MD  famotidine  (PEPCID ) 20 MG tablet Take 1 tablet (20 mg total) by mouth 2 (two) times daily. Take one tablet twice daily for two days 12/14/23 12/13/24  Cindie Carlin POUR, DO  Fluticasone -Umeclidin-Vilant (TRELEGY ELLIPTA ) 200-62.5-25 MCG/ACT AEPB Inhale 1 puff into the lungs daily. 07/22/23   Cari Arlean HERO, FNP  gabapentin  (NEURONTIN ) 300 MG capsule Take 1 capsule (300 mg total) by mouth at bedtime. 06/13/23   Zarwolo, Gloria, FNP  HYDROcodone -acetaminophen  (NORCO/VICODIN) 5-325 MG tablet Take 1 tablet by mouth every 4 (four) hours as needed. 12/05/23   Odell Balls, PA-C  ibuprofen  (ADVIL ) 800 MG tablet Take 1 tablet (800 mg total) by mouth 3 (three) times daily. 11/14/23   Leath-Warren, Etta PARAS, NP  levocetirizine (XYZAL ) 5 MG tablet Take 1 tablet (5 mg total) by mouth 2 (two) times daily as needed for allergies (hives/itching). 07/22/23   Cari Arlean HERO, FNP  metFORMIN  (GLUCOPHAGE ) 1000 MG tablet TAKE ONE-HALF TABLET BY MOUTH TWICE DAILY WITH A MEAL Patient taking differently: Take 500 mg by mouth 2 (two) times daily with a meal. 11/07/23   Zarwolo, Gloria, FNP  montelukast  (SINGULAIR ) 10 MG tablet Take 1 tablet (10 mg total) by mouth at bedtime.  11/07/23   Cari Arlean HERO, FNP  Multiple Vitamins-Calcium (ONE-A-DAY WOMENS PO) Take 1 tablet by mouth daily.    [provider]  NON FORMULARY Pt uses a cpap nightly    [provider]  omalizumab  (XOLAIR ) 300 MG/2  ML prefilled syringe Inject 300 mg into the skin every 28 (twenty-eight) days. 11/10/23   Iva Marty Saltness, MD  ondansetron  (ZOFRAN -ODT) 4 MG disintegrating tablet Take 1 tablet (4 mg total) by mouth every 8 (eight) hours as needed for nausea or vomiting. 09/20/23   Yolande Lamar BROCKS, MD   pantoprazole  (PROTONIX ) 40 MG tablet Take 1 tablet (40 mg total) by mouth daily. 12/14/23 12/13/24  Cindie Carlin POUR, DO  predniSONE  (DELTASONE ) 20 MG tablet Take 2 tablets (40 mg total) by mouth daily with breakfast. For the next four days Patient not taking: Reported on 12/14/2023 10/24/23   Garrick Lamar, MD  sucralfate  (CARAFATE ) 1 g tablet Take 1 tablet (1 g total) by mouth 4 (four) times daily -  with meals and at bedtime. 10/24/23   Garrick Lamar, MD  SUMAtriptan  (IMITREX ) 100 MG tablet TAKE ONE TABLET BY MOUTH EVERY 2 HOURS AS NEEDED FOR MIGRAINE. MAY REPEAT IN 2 HOURS IF HEADACHE PERSISTS OR RECURS Patient taking differently: Take 100 mg by mouth as needed. 07/28/23   Zarwolo, Gloria, FNP  tamsulosin  (FLOMAX ) 0.4 MG CAPS capsule Take 1 capsule (0.4 mg total) by mouth daily after supper. 11/14/23   Leath-Warren, Etta PARAS, NP  tirzepatide  (ZEPBOUND ) 2.5 MG/0.5ML injection vial Inject 2.5 mg into the skin once a week. Patient not taking: Reported on 12/14/2023 10/05/23   Bevely Doffing, FNP  triamcinolone  ointment (KENALOG ) 0.1 % Apply twice daily for flare ups below neck, maximum 10 days. Patient taking differently: as needed. Apply twice daily for flare ups below neck, maximum 10 days. 04/11/23   Tobie Arleta SQUIBB, MD  Vitamin D , Ergocalciferol , (DRISDOL ) 1.25 MG (50000 UNIT) CAPS capsule Take 1 capsule (50,000 Units total) by mouth every 7 (seven) days. 06/27/23   Zarwolo, Gloria, FNP  apixaban  (ELIQUIS ) 5 MG TABS tablet Take 2 tablets (10mg ) twice daily for 7 days, then 1 tablet (5mg ) twice daily 04/20/19 04/20/19  Meryle Ip A, PA-C  levonorgestrel  (MIRENA ) 20 MCG/24HR IUD 1 each by Intrauterine route once.   06/25/20  [provider]    Allergies: Shellfish allergy , Sulfa  antibiotics, and Adhesive [tape]    Review of Systems  Musculoskeletal:  Positive for back pain.    Updated Vital Signs BP 115/62   Pulse 87   Temp 98.4 F (36.9 C)   Resp 17   Ht 5' 1 (1.549 m)   Wt  (!) 161.3 kg   LMP 12/26/2022 (Approximate)   SpO2 100%   BMI 67.21 kg/m   Physical Exam Vitals and nursing note reviewed.  Constitutional:      General: She is not in acute distress.    Appearance: She is well-developed. She is obese.  HENT:     Head: Normocephalic and atraumatic.     Mouth/Throat:     Mouth: Mucous membranes are moist.  Eyes:     Conjunctiva/sclera: Conjunctivae normal.  Cardiovascular:     Rate and Rhythm: Normal rate and regular rhythm.     Heart sounds: No murmur heard. Pulmonary:     Effort: Pulmonary effort is normal. No respiratory distress.     Breath sounds: Normal breath sounds.  Abdominal:     Palpations: Abdomen is soft.     Tenderness:  There is no abdominal tenderness. There is no right CVA tenderness or left CVA tenderness.  Musculoskeletal:        General: No swelling.     Cervical back: Neck supple.     Lumbar back: No swelling. Negative right straight leg raise test and negative left straight leg raise test.     Comments: Lumbar tenderness with no specific bony tenderness.  Skin:    General: Skin is warm and dry.     Capillary Refill: Capillary refill takes less than 2 seconds.  Neurological:     General: No focal deficit present.     Mental Status: She is alert and oriented to person, place, and time.     Sensory: No sensory deficit.     Motor: No weakness.  Psychiatric:        Mood and Affect: Mood normal.     (all labs ordered are listed, but only abnormal results are displayed) Labs Reviewed  URINALYSIS, ROUTINE W REFLEX MICROSCOPIC - Abnormal; Notable for the following components:      Result Value   Hgb urine dipstick MODERATE (*)    All other components within normal limits    EKG: None  Radiology: No results found.   Procedures   Medications Ordered in the ED  ketorolac  (TORADOL ) 15 MG/ML injection 15 mg (15 mg Intramuscular Given 12/16/23 1659)  methocarbamol  (ROBAXIN ) tablet 500 mg (500 mg Oral Given 12/16/23  1659)  oxyCODONE  (Oxy IR/ROXICODONE ) immediate release tablet 5 mg (5 mg Oral Given 12/16/23 1658)                                    Medical Decision Making This patient presents to the ED for concern of back pain this involves an extensive number of treatment options, and is a complaint that carries with it a high risk of complications and morbidity.  The differential diagnosis includes sprain, strain, HNP, fracture, DDD, muscle spasm, cauda equina, epidural abscess or hematoma, malignancy, other   Co morbidities that complicate the patient evaluation  Obesity, uterine fibroids   Additional history obtained:  Additional history obtained from EMR External records from outside source obtained and reviewed including prior notes, labs, CT from November 14, 2023   Lab Tests:  I Ordered, and personally interpreted labs.  The pertinent results include:  UA normal        Problem List / ED Course / Critical interventions / Medication management  Patient presents with low back pain . Differential considered as above. Symptoms are likely due to muscle strain. They have no high risk features including saddle anesthesia/paresthesia, bowel or bladder incontinence, urinary retention, fever, weight loss, history of cancer or immune suppression, or IV drug use. We discussed plan to treat symptoms with analgesics, and follow up with PCP. They were advised on strict return precautions.  I ordered medication including toradol , robaxin  and oxycodone   for low back pain  Reevaluation of the patient after these medicines showed that the patient improved significantly I have reviewed the patients home medicines and have made adjustments as needed       Amount and/or Complexity of Data Reviewed Labs: ordered.  Risk Prescription drug management.        Final diagnoses:  Acute bilateral low back pain without sciatica    ED Discharge Orders          Ordered    oxyCODONE  (ROXICODONE ) 5 MG  immediate release tablet  Every 6 hours PRN        12/16/23 1758    methocarbamol  (ROBAXIN ) 500 MG tablet  Every 6 hours PRN        12/16/23 1758    naproxen  (NAPROSYN ) 500 MG tablet  2 times daily        12/16/23 1758               Suellen Sherran LABOR, PA-C 12/17/23 [id number]    Patsey Lot, MD 12/17/23 1329

## 2023-12-19 ENCOUNTER — Other Ambulatory Visit: Payer: Self-pay

## 2023-12-19 ENCOUNTER — Emergency Department (HOSPITAL_COMMUNITY)
Admission: EM | Admit: 2023-12-19 | Discharge: 2023-12-19 | Disposition: A | Discharge status: Home / Self Care | Attending: Emergency Medicine | Admitting: Emergency Medicine

## 2023-12-19 ENCOUNTER — Encounter (HOSPITAL_COMMUNITY): Payer: Self-pay | Admitting: Emergency Medicine

## 2023-12-19 ENCOUNTER — Ambulatory Visit: Payer: Self-pay | Admitting: *Deleted

## 2023-12-19 ENCOUNTER — Emergency Department (HOSPITAL_COMMUNITY)

## 2023-12-19 DIAGNOSIS — I10 Essential (primary) hypertension: Secondary | ICD-10-CM | POA: Diagnosis not present

## 2023-12-19 DIAGNOSIS — Z79899 Other long term (current) drug therapy: Secondary | ICD-10-CM | POA: Diagnosis not present

## 2023-12-19 DIAGNOSIS — E119 Type 2 diabetes mellitus without complications: Secondary | ICD-10-CM | POA: Diagnosis not present

## 2023-12-19 DIAGNOSIS — K429 Umbilical hernia without obstruction or gangrene: Secondary | ICD-10-CM | POA: Diagnosis not present

## 2023-12-19 DIAGNOSIS — Z7984 Long term (current) use of oral hypoglycemic drugs: Secondary | ICD-10-CM | POA: Insufficient documentation

## 2023-12-19 DIAGNOSIS — R109 Unspecified abdominal pain: Secondary | ICD-10-CM | POA: Insufficient documentation

## 2023-12-19 DIAGNOSIS — N838 Other noninflammatory disorders of ovary, fallopian tube and broad ligament: Secondary | ICD-10-CM | POA: Diagnosis not present

## 2023-12-19 DIAGNOSIS — R1032 Left lower quadrant pain: Secondary | ICD-10-CM | POA: Diagnosis not present

## 2023-12-19 LAB — URINALYSIS, ROUTINE W REFLEX MICROSCOPIC
Bacteria, UA: NONE SEEN
Bilirubin Urine: NEGATIVE
Glucose, UA: NEGATIVE mg/dL
Ketones, ur: NEGATIVE mg/dL
Leukocytes,Ua: NEGATIVE
Nitrite: NEGATIVE
Protein, ur: NEGATIVE mg/dL
Specific Gravity, Urine: 1.023 (ref 1.005–1.030)
pH: 6 (ref 5.0–8.0)

## 2023-12-19 LAB — PREGNANCY, URINE: Preg Test, Ur: NEGATIVE

## 2023-12-19 MED ORDER — METHYLPREDNISOLONE 4 MG PO TBPK: ORAL_TABLET | ORAL | 0 refills | Status: AC

## 2023-12-19 MED ORDER — KETOROLAC TROMETHAMINE 15 MG/ML IJ SOLN
15.0000 mg | Freq: Once | INTRAMUSCULAR | Status: AC
  Administered 2023-12-19: 15 mg via INTRAMUSCULAR
  Filled 2023-12-19: qty 1

## 2023-12-19 MED ORDER — OXYCODONE HCL 5 MG PO TABS
5.0000 mg | ORAL_TABLET | ORAL | Status: AC
  Administered 2023-12-19: 5 mg via ORAL
  Filled 2023-12-19: qty 1

## 2023-12-19 MED ORDER — LIDOCAINE 5 % EX PTCH
1.0000 | MEDICATED_PATCH | CUTANEOUS | Status: DC
  Administered 2023-12-19: 1 via TRANSDERMAL
  Filled 2023-12-19: qty 1

## 2023-12-19 NOTE — Progress Notes (Signed)
 DAP note              Client name: Jessica Sanchez Therapist name: Camie Norris Jessica Sanchez SILK Date: 10/25/2023 Time: 60 mins   Data: Client reports that she had a health scare yesterday. She reports that she was having chest pains and that she did not want to risk it so she went to the hospital. She states that some things were off but she was not having a heart attack so they eventually sent her home. She reports that she feeling a bit better but tired. Client reports depressive symptoms are at a 6 and would like to get that to a 2 with the use of interventions.   Assessment: The client presented very sad and overwhelmed. She can not seem to get her health back on track. She has been feeling bad more than she is feeling good. They did recommend her to some specialists and so hopefully she will get some answers that she needs.     Plan: The client will attend bi-weekly sessions with a goal of attending at least 2 per month. Client will take a moment of mindfulness daily and utilize box breathing as needed to reduce symptoms. Work on healing past trauma by figuring out triggers, to help with symptom reduction, emotional regulation, and processing.

## 2023-12-19 NOTE — ED Provider Notes (Signed)
 [place] EMERGENCY DEPARTMENT AT [facility] Provider Note   CSN: [id number] Arrival date & time: 12/19/23  1437     Patient presents with: Back Pain (Lower)   Jessica Sanchez is a 39 y.o. female.  {Add pertinent medical, surgical, social history, OB history to HPI:7432} 38 year old female with history of diabetes and hypertension who presents emergency department left flank pain.  Patient reports that she had atraumatic left flank pain that started 3 to 4 days ago.  Sharp.  Worsened with movement.  No fevers or chills.  No dysuria or frequency.  No leg numbness or saddle anesthesia.  Has tried oxycodone  and Robaxin  without relief so came into the emergency department.       Prior to Admission medications   Medication Sig Start Date End Date Taking? Authorizing Provider  albuterol  (VENTOLIN  HFA) 108 (90 Base) MCG/ACT inhaler Inhale 2 puffs into the lungs every 6 (six) hours as needed. 06/27/23   Leath-Warren, Etta PARAS, NP  amlodipine -olmesartan  (AZOR ) 10-20 MG tablet TAKE ONE TABLET BY MOUTH EVERY DAY 10/04/23   Zarwolo, Gloria, FNP  Blood Glucose Monitoring Suppl DEVI 1 each by Does not apply route in the morning, at noon, and at bedtime. May substitute to any manufacturer covered by patient's insurance. 01/11/23   Zarwolo, Gloria, FNP  diazepam  (VALIUM ) 5 MG tablet Take 1 tablet (5 mg total) by mouth once as needed for up to 1 dose for anxiety (take 15-30 minutes before procedure). Patient not taking: Reported on 12/14/2023 10/17/23   Bevely Doffing, FNP  EPINEPHrine  (EPIPEN  2-PAK) 0.3 mg/0.3 mL IJ SOAJ injection Inject 0.3 mg into the muscle as needed for anaphylaxis. 02/16/23   Iva Marty Saltness, MD  famotidine  (PEPCID ) 20 MG tablet Take 1 tablet (20 mg total) by mouth 2 (two) times daily. Take one tablet twice daily for two days 12/14/23 12/13/24  Cindie Carlin POUR, DO  Fluticasone -Umeclidin-Vilant (TRELEGY ELLIPTA ) 200-62.5-25 MCG/ACT AEPB Inhale 1 puff into the lungs  daily. 07/22/23   Cari Arlean HERO, FNP  gabapentin  (NEURONTIN ) 300 MG capsule Take 1 capsule (300 mg total) by mouth at bedtime. 06/13/23   Zarwolo, Gloria, FNP  HYDROcodone -acetaminophen  (NORCO/VICODIN) 5-325 MG tablet Take 1 tablet by mouth every 4 (four) hours as needed. 12/05/23   Odell Balls, PA-C  ibuprofen  (ADVIL ) 800 MG tablet Take 1 tablet (800 mg total) by mouth 3 (three) times daily. 11/14/23   Leath-Warren, Etta PARAS, NP  levocetirizine (XYZAL ) 5 MG tablet Take 1 tablet (5 mg total) by mouth 2 (two) times daily as needed for allergies (hives/itching). 07/22/23   Ambs, Arlean HERO, FNP  metFORMIN  (GLUCOPHAGE ) 1000 MG tablet TAKE ONE-HALF TABLET BY MOUTH TWICE DAILY WITH A MEAL Patient taking differently: Take 500 mg by mouth 2 (two) times daily with a meal. 11/07/23   Zarwolo, Gloria, FNP  methocarbamol  (ROBAXIN ) 500 MG tablet Take 1 tablet (500 mg total) by mouth every 6 (six) hours as needed for muscle spasms. 12/16/23   Suellen Cantor A, PA-C  montelukast  (SINGULAIR ) 10 MG tablet Take 1 tablet (10 mg total) by mouth at bedtime. 11/07/23   Cari Arlean HERO, FNP  Multiple Vitamins-Calcium (ONE-A-DAY WOMENS PO) Take 1 tablet by mouth daily.    [provider]  naproxen  (NAPROSYN ) 500 MG tablet Take 1 tablet (500 mg total) by mouth 2 (two) times daily. 12/16/23   Suellen Cantor A, PA-C  NON FORMULARY Pt uses a cpap nightly    [provider]  omalizumab  (XOLAIR ) 300 MG/2  ML prefilled syringe Inject 300 mg into the skin every 28 (twenty-eight) days. 11/10/23   Iva Marty Saltness, MD  ondansetron  (ZOFRAN -ODT) 4 MG disintegrating tablet Take 1 tablet (4 mg total) by mouth every 8 (eight) hours as needed for nausea or vomiting. 09/20/23   Yolande Lamar BROCKS, MD  oxyCODONE  (ROXICODONE ) 5 MG immediate release tablet Take 1 tablet (5 mg total) by mouth every 6 (six) hours as needed. 12/16/23   Suellen Cantor A, PA-C  pantoprazole  (PROTONIX ) 40 MG tablet Take 1 tablet (40 mg total) by mouth daily.  12/14/23 12/13/24  Cindie Carlin POUR, DO  predniSONE  (DELTASONE ) 20 MG tablet Take 2 tablets (40 mg total) by mouth daily with breakfast. For the next four days Patient not taking: Reported on 12/14/2023 10/24/23   Garrick Lamar, MD  sucralfate  (CARAFATE ) 1 g tablet Take 1 tablet (1 g total) by mouth 4 (four) times daily -  with meals and at bedtime. 10/24/23   Garrick Lamar, MD  SUMAtriptan  (IMITREX ) 100 MG tablet TAKE ONE TABLET BY MOUTH EVERY 2 HOURS AS NEEDED FOR MIGRAINE. MAY REPEAT IN 2 HOURS IF HEADACHE PERSISTS OR RECURS Patient taking differently: Take 100 mg by mouth as needed. 07/28/23   Zarwolo, Gloria, FNP  tamsulosin  (FLOMAX ) 0.4 MG CAPS capsule Take 1 capsule (0.4 mg total) by mouth daily after supper. 11/14/23   Leath-Warren, Etta PARAS, NP  tirzepatide  (ZEPBOUND ) 2.5 MG/0.5ML injection vial Inject 2.5 mg into the skin once a week. Patient not taking: Reported on 12/14/2023 10/05/23   Bevely Doffing, FNP  triamcinolone  ointment (KENALOG ) 0.1 % Apply twice daily for flare ups below neck, maximum 10 days. Patient taking differently: as needed. Apply twice daily for flare ups below neck, maximum 10 days. 04/11/23   Tobie Arleta SQUIBB, MD  Vitamin D , Ergocalciferol , (DRISDOL ) 1.25 MG (50000 UNIT) CAPS capsule Take 1 capsule (50,000 Units total) by mouth every 7 (seven) days. 06/27/23   Zarwolo, Gloria, FNP  apixaban  (ELIQUIS ) 5 MG TABS tablet Take 2 tablets (10mg ) twice daily for 7 days, then 1 tablet (5mg ) twice daily 04/20/19 04/20/19  Meryle Ip A, PA-C  levonorgestrel  (MIRENA ) 20 MCG/24HR IUD 1 each by Intrauterine route once.   06/25/20  [provider]    Allergies: Shellfish allergy , Sulfa  antibiotics, and Adhesive [tape]    Review of Systems  Updated Vital Signs BP 122/75 (BP Location: Right Arm)   Pulse 91   Temp 98.6 F (37 C) (Oral)   Resp 20   Ht 5' 1 (1.549 m)   Wt (!) 161.3 kg   LMP 12/26/2022 (Approximate)   SpO2 96%   BMI 67.19 kg/m   Physical  Exam Abdominal:     Tenderness: There is left CVA tenderness. There is no right CVA tenderness.  Musculoskeletal:     Comments: No lumbar spine or thoracic spine ttp  Motor: Muscle bulk and tone are normal. Strength is 5/5 in hip flexion, knee flexion and extension, ankle dorsiflexion and plantar flexion bilaterally. Full strength of great toe dorsiflexion bilaterally.  Sensory: Intact sensation to light touch in L2 though S1 dermatomes bilaterally.   Skin:    Comments: No rash on the flank.  No tenderness palpation to light touch of that area.     (all labs ordered are listed, but only abnormal results are displayed) Labs Reviewed  URINALYSIS, ROUTINE W REFLEX MICROSCOPIC - Abnormal; Notable for the following components:      Result Value   Hgb urine dipstick MODERATE (*)  All other components within normal limits  PREGNANCY, URINE    EKG: None  Radiology: No results found.  {Document cardiac monitor, telemetry assessment procedure when appropriate:32947} Procedures   Medications Ordered in the ED  ketorolac  (TORADOL ) 15 MG/ML injection 15 mg (has no administration in time range)  lidocaine  (LIDODERM ) 5 % 1 patch (has no administration in time range)  oxyCODONE  (Oxy IR/ROXICODONE ) immediate release tablet 5 mg (has no administration in time range)      {Click here for ABCD2, HEART and other calculators REFRESH Note before signing:1}                              Medical Decision Making Amount and/or Complexity of Data Reviewed Labs: ordered. Radiology: ordered.  Risk Prescription drug management.   ***  {Document critical care time when appropriate  Document review of labs and clinical decision tools ie CHADS2VASC2, etc  Document your independent review of radiology images and any outside records  Document your discussion with family members, caretakers and with consultants  Document social determinants of health affecting pt's care  Document your decision  making why or why not admission, treatments were needed:32947:::1}   Final diagnoses:  None    ED Discharge Orders     None

## 2023-12-19 NOTE — Telephone Encounter (Signed)
 FYI Only or Action Required?: FYI only for provider.  Patient was last seen in primary care on 11/24/2023 by Glennon Sand, NP.  Called Nurse Triage reporting Back Pain.  Symptoms began several days ago.  Interventions attempted: Prescription medications: oxycodone , robaxan.  Symptoms are: gradually worsening.  Triage Disposition: See HCP Within 4 Hours (Or PCP Triage)  Patient/caregiver understands and will follow disposition?: No open appointment- patient is having to take breaks for pain during call- advised ED for uncontrolled pain.    Reason for Disposition  [1] SEVERE back pain (e.g., excruciating, unable to do any normal activities) AND [2] not improved 2 hours after pain medicine  Answer Assessment - Initial Assessment Questions 1. ONSET: When did the pain begin? (e.g., minutes, hours, days)     Patient was seen at ED- no testing - patient was given medication- not helping 2. LOCATION: Where does it hurt? (upper, mid or lower back)     Lower back- left side 3. SEVERITY: How bad is the pain?  (e.g., Scale 1-10; mild, moderate, or severe)     severe 4. PATTERN: Is the pain constant? (e.g., yes, no; constant, intermittent)      constant 5. RADIATION: Does the pain shoot into your legs or somewhere else?     side 6. CAUSE:  What do you think is causing the back pain?      unsure 7. BACK OVERUSE:  Any recent lifting of heavy objects, strenuous work or exercise?     unknown 8. MEDICINES: What have you taken so far for the pain? (e.g., nothing, acetaminophen , NSAIDS)     Muscle relaxer, oxycodone  9. NEUROLOGIC SYMPTOMS: Do you have any weakness, numbness, or problems with bowel/bladder control?     No BM- unsure last movement- 3-4 days, weakness 10. OTHER SYMPTOMS: Do you have any other symptoms? (e.g., fever, abdomen pain, burning with urination, blood in urine)       Abdominal pain, frequency  Protocols used: Back Pain-A-AH   Copied from CRM  #[id number]. Topic: Clinical - Red Word Triage >> Dec 19, 2023 10:35 AM [place] J wrote: [facility] that prompted transfer to Nurse Triage: Patient is having extreme back pain. ER prescribed muscle relaxer and pain medication but nothing has helped.

## 2023-12-19 NOTE — ED Triage Notes (Signed)
 Pt presents with lower back pain x1 week, seen for same 12/16/23.

## 2023-12-19 NOTE — Discharge Instructions (Signed)
 You were seen for your back pain in the emergency department.   At home, please take your pain medication and the medrol  dosepack that we have prescribed you.    Check your MyChart online for the results of any tests that had not resulted by the time you left the emergency department.   Follow-up with your primary doctor in 2-3 days regarding your visit.    Return immediately to the emergency department if you experience any of the following: bowel or bladder incontinence, numbness between your legs, weakness, or any other concerning symptoms.    Thank you for visiting our Emergency Department. It was a pleasure taking care of you today.

## 2023-12-19 NOTE — Telephone Encounter (Signed)
 Noted patient advised to go to ED

## 2023-12-20 ENCOUNTER — Ambulatory Visit: Payer: Self-pay | Admitting: Nurse Practitioner

## 2023-12-20 ENCOUNTER — Ambulatory Visit (HOSPITAL_COMMUNITY)
Admission: RE | Admit: 2023-12-20 | Discharge: 2023-12-20 | Disposition: A | Discharge status: Home / Self Care | Source: Ambulatory Visit | Attending: Nurse Practitioner | Admitting: Nurse Practitioner

## 2023-12-20 ENCOUNTER — Encounter: Payer: Self-pay | Admitting: Nurse Practitioner

## 2023-12-20 VITALS — BP 106/54 | HR 77 | Ht 61.0 in | Wt 352.0 lb

## 2023-12-20 DIAGNOSIS — M545 Low back pain, unspecified: Secondary | ICD-10-CM

## 2023-12-20 DIAGNOSIS — R252 Cramp and spasm: Secondary | ICD-10-CM | POA: Diagnosis not present

## 2023-12-20 MED ORDER — KETOROLAC TROMETHAMINE 10 MG PO TABS: 10.0000 mg | ORAL_TABLET | Freq: Four times a day (QID) | ORAL | 0 refills | Status: AC | PRN

## 2023-12-20 MED ORDER — CYCLOBENZAPRINE HCL 10 MG PO TABS: 10.0000 mg | ORAL_TABLET | Freq: Three times a day (TID) | ORAL | 0 refills | Status: DC | PRN

## 2023-12-20 NOTE — Patient Instructions (Signed)
 1) LS xray at [facility] today if possible 2) Stop naproxen /IBU - ketorlac instead 3) Stop methocarbamol  - cyclobenzaprine  instead 4) Work note to sit when needed and not lift > 5 lbs x 2 weeks

## 2023-12-20 NOTE — Progress Notes (Signed)
 Established Patient Office Visit  Subjective:  Patient ID: Jessica Sanchez, female    DOB: 23-Apr-1986  Age: 38 y.o. MRN: [id number]  Chief Complaint  Patient presents with   Back Pain    Back pain, has been seen in ER, medication not helping    Pt seen in ER yesterday for low right sided back pain.  Renal CT ruled kidney stones in ER.  Patient has on lidocaine  patch and was given pain med injection and oxycodone .  She is having difficulty with pain with movement, coughing, sneezing, laughing, etc.  Patient needs work note stating she can sit when needed intermittently and pt cannot lift greater than 5 lbs x 2 weeks.  Back Pain    No other concerns at this time.   Past Medical History:  Diagnosis Date   Angio-edema    Anxiety    Arthritis    right shoulder   Asthma    Diabetes mellitus without complication (HCC)    Dyspnea    Dysrhythmia    hx of SVT   Fibroid (bleeding) (uterine)    Fibroid, uterine    H/O metrorrhagia    Hypertension    IUD (intrauterine device) in place 07/11/2015   Menorrhagia    Obesity, Class III, BMI 40-49.9 (morbid obesity) 11/11/2018   Sleep apnea    uses CPAP - setting 11   SVT (supraventricular tachycardia) (HCC)     Past Surgical History:  Procedure Laterality Date   DILATATION AND CURETTAGE/HYSTEROSCOPY WITH MINERVA N/A 12/01/2021   Procedure: DILATATION AND CURETTAGE/HYSTEROSCOPY WITH MINERVA;  Surgeon: Jayne Vonn DEL, MD;  Location: [facility];  Service: Gynecology;  Laterality: N/A;   HYSTERECTOMY ABDOMINAL WITH SALPINGECTOMY Bilateral 01/19/2023   Procedure: HYSTERECTOMY ABDOMINAL WITH SALPINGECTOMY;  Surgeon: Jayne Vonn DEL, MD;  Location: [facility];  Service: Gynecology;  Laterality: Bilateral;   HYSTEROSCOPY N/A 09/17/2020   Procedure: HYSTEROSCOPY;  Surgeon: Jayne Vonn DEL, MD;  Location: [facility];  Service: Gynecology;  Laterality: N/A;   HYSTEROSCOPY WITH D & C N/A 09/27/2013   Procedure: DILATATION AND CURETTAGE /HYSTEROSCOPY and  insertion of Mirena  IUD ;  Surgeon: Marjorie DEL. Okey, MD;  Location: [facility];  Service: Gynecology;  Laterality: N/A;   IUD REMOVAL N/A 09/17/2020   Procedure: INTRAUTERINE DEVICE (IUD) REMOVAL (2);  Surgeon: Jayne Vonn DEL, MD;  Location: [facility];  Service: Gynecology;  Laterality: N/A;   LAPAROSCOPY N/A 01/19/2023   Procedure: LAPAROSCOPY DIAGNOSTIC;  Surgeon: Jayne Vonn DEL, MD;  Location: [facility];  Service: Gynecology;  Laterality: N/A;   MYOMECTOMY  02/03/2011   Procedure: MYOMECTOMY;  Surgeon: Vonn DEL Jayne, MD;  Location: [facility];  Service: Gynecology;  Laterality: N/A;   SVT ABLATION N/A 11/08/2018   Procedure: SVT ABLATION;  Surgeon: Waddell Danelle ORN, MD;  Location: [facility];  Service: Cardiovascular;  Laterality: N/A;   SVT ABLATION N/A 12/26/2020   Procedure: SVT ABLATION;  Surgeon: Waddell Danelle ORN, MD;  Location: [facility];  Service: Cardiovascular;  Laterality: N/A;   TONSILLECTOMY     TYMPANOSTOMY TUBE PLACEMENT      Social History   Socioeconomic History   Marital status: Single    Spouse name: Not on file   Number of children: 0   Years of education: Not on file   Highest education level: 12th grade  Occupational History   Not on file  Tobacco Use   Smoking status: Never   Smokeless tobacco: Never   Tobacco comments:  socially  Vaping Use   Vaping status: Never Used  Substance and Sexual Activity   Alcohol use: Not Currently   Drug use: Yes    Types: Marijuana   Sexual activity: Not Currently    Birth control/protection: Surgical    Comment: hyst  Other Topics Concern   Not on file  Social History Narrative   Not on file   Social Drivers of Health   Financial Resource Strain: Low Risk  (05/15/2023)   Overall Financial Resource Strain (CARDIA)    Difficulty of Paying Living Expenses: Not very hard  Food Insecurity: No Food Insecurity (05/15/2023)   Hunger Vital Sign    Worried About Running Out of Food in the Last Year: Never true    Ran  Out of Food in the Last Year: Never true  Transportation Needs: No Transportation Needs (05/15/2023)   PRAPARE - Administrator, Civil Service (Medical): No    Lack of Transportation (Non-Medical): No  Physical Activity: Sufficiently Active (05/15/2023)   Exercise Vital Sign    Days of Exercise per Week: 5 days    Minutes of Exercise per Session: 30 min  Stress: Stress Concern Present (05/15/2023)   [employer] of Occupational Health - Occupational Stress Questionnaire    Feeling of Stress : Rather much  Social Connections: Moderately Isolated (05/15/2023)   Social Connection and Isolation Panel    Frequency of Communication with Friends and Family: More than three times a week    Frequency of Social Gatherings with Friends and Family: Once a week    Attends Religious Services: More than 4 times per year    Active Member of [employer] or Organizations: No    Attends Engineer, structural: Not on file    Marital Status: Never married  Intimate Partner Violence: Not At Risk (01/19/2023)   Humiliation, Afraid, Rape, and Kick questionnaire    Fear of Current or Ex-Partner: No    Emotionally Abused: No    Physically Abused: No    Sexually Abused: No    Family History  Problem Relation Age of Onset   Cirrhosis Mother    Diabetes Mother    Cancer Maternal Grandfather    Hypertension Sister    Diabetes Sister    Anesthesia problems Neg Hx    Hypotension Neg Hx    Malignant hyperthermia Neg Hx    Pseudochol deficiency Neg Hx     Allergies  Allergen Reactions   Shellfish Allergy  Shortness Of Breath, Itching and Swelling    Mouth became swollen, throat started itching, and developed shortness of breath   Sulfa  Antibiotics Shortness Of Breath, Itching and Swelling    Mouth became swollen, throat started itching, and developed shortness of breath   Adhesive [Tape] Itching    Depends on the type of medical tape    Outpatient Medications Prior to Visit  Medication  Sig   albuterol  (VENTOLIN  HFA) 108 (90 Base) MCG/ACT inhaler Inhale 2 puffs into the lungs every 6 (six) hours as needed.   amlodipine -olmesartan  (AZOR ) 10-20 MG tablet TAKE ONE TABLET BY MOUTH EVERY DAY   Blood Glucose Monitoring Suppl DEVI 1 each by Does not apply route in the morning, at noon, and at bedtime. May substitute to any manufacturer covered by patient's insurance.   diazepam  (VALIUM ) 5 MG tablet Take 1 tablet (5 mg total) by mouth once as needed for up to 1 dose for anxiety (take 15-30 minutes before procedure). (Patient not taking: Reported on 12/14/2023)  EPINEPHrine  (EPIPEN  2-PAK) 0.3 mg/0.3 mL IJ SOAJ injection Inject 0.3 mg into the muscle as needed for anaphylaxis.   famotidine  (PEPCID ) 20 MG tablet Take 1 tablet (20 mg total) by mouth 2 (two) times daily. Take one tablet twice daily for two days   Fluticasone -Umeclidin-Vilant (TRELEGY ELLIPTA ) 200-62.5-25 MCG/ACT AEPB Inhale 1 puff into the lungs daily.   gabapentin  (NEURONTIN ) 300 MG capsule Take 1 capsule (300 mg total) by mouth at bedtime.   HYDROcodone -acetaminophen  (NORCO/VICODIN) 5-325 MG tablet Take 1 tablet by mouth every 4 (four) hours as needed.   ibuprofen  (ADVIL ) 800 MG tablet Take 1 tablet (800 mg total) by mouth 3 (three) times daily.   levocetirizine (XYZAL ) 5 MG tablet Take 1 tablet (5 mg total) by mouth 2 (two) times daily as needed for allergies (hives/itching).   metFORMIN  (GLUCOPHAGE ) 1000 MG tablet TAKE ONE-HALF TABLET BY MOUTH TWICE DAILY WITH A MEAL (Patient taking differently: Take 500 mg by mouth 2 (two) times daily with a meal.)   methocarbamol  (ROBAXIN ) 500 MG tablet Take 1 tablet (500 mg total) by mouth every 6 (six) hours as needed for muscle spasms.   methylPREDNISolone  (MEDROL  DOSEPAK) 4 MG TBPK tablet Take as directed on packaging   montelukast  (SINGULAIR ) 10 MG tablet Take 1 tablet (10 mg total) by mouth at bedtime.   Multiple Vitamins-Calcium (ONE-A-DAY WOMENS PO) Take 1 tablet by mouth daily.    naproxen  (NAPROSYN ) 500 MG tablet Take 1 tablet (500 mg total) by mouth 2 (two) times daily.   NON FORMULARY Pt uses a cpap nightly   omalizumab  (XOLAIR ) 300 MG/2  ML prefilled syringe Inject 300 mg into the skin every 28 (twenty-eight) days.   ondansetron  (ZOFRAN -ODT) 4 MG disintegrating tablet Take 1 tablet (4 mg total) by mouth every 8 (eight) hours as needed for nausea or vomiting.   oxyCODONE  (ROXICODONE ) 5 MG immediate release tablet Take 1 tablet (5 mg total) by mouth every 6 (six) hours as needed.   pantoprazole  (PROTONIX ) 40 MG tablet Take 1 tablet (40 mg total) by mouth daily.   predniSONE  (DELTASONE ) 20 MG tablet Take 2 tablets (40 mg total) by mouth daily with breakfast. For the next four days (Patient not taking: Reported on 12/14/2023)   sucralfate  (CARAFATE ) 1 g tablet Take 1 tablet (1 g total) by mouth 4 (four) times daily -  with meals and at bedtime.   SUMAtriptan  (IMITREX ) 100 MG tablet TAKE ONE TABLET BY MOUTH EVERY 2 HOURS AS NEEDED FOR MIGRAINE. MAY REPEAT IN 2 HOURS IF HEADACHE PERSISTS OR RECURS (Patient taking differently: Take 100 mg by mouth as needed.)   tamsulosin  (FLOMAX ) 0.4 MG CAPS capsule Take 1 capsule (0.4 mg total) by mouth daily after supper.   tirzepatide  (ZEPBOUND ) 2.5 MG/0.5ML injection vial Inject 2.5 mg into the skin once a week. (Patient not taking: Reported on 12/14/2023)   triamcinolone  ointment (KENALOG ) 0.1 % Apply twice daily for flare ups below neck, maximum 10 days. (Patient taking differently: as needed. Apply twice daily for flare ups below neck, maximum 10 days.)   Vitamin D , Ergocalciferol , (DRISDOL ) 1.25 MG (50000 UNIT) CAPS capsule Take 1 capsule (50,000 Units total) by mouth every 7 (seven) days.   Facility-Administered Medications Prior to Visit  Medication Dose Route Frequency Provider   omalizumab  (XOLAIR ) prefilled syringe 300 mg  300 mg Subcutaneous Q28 days Iva Marty Saltness, MD    Review of Systems  Musculoskeletal:  Positive for  back pain.       Objective:  BP (!) 106/54   Pulse 77   Ht 5' 1 (1.549 m)   Wt (!) 352 lb (159.7 kg)   LMP 12/26/2022 (Approximate)   SpO2 96%   BMI 66.51 kg/m   Vitals:   12/20/23 1032  BP: (!) 106/54  Pulse: 77  Height: 5' 1 (1.549 m)  Weight: (!) 352 lb (159.7 kg)  SpO2: 96%  BMI (Calculated): 66.54    Physical Exam Vitals and nursing note reviewed.  Constitutional:      Appearance: Normal appearance.  HENT:     Head: Normocephalic.     Nose: Nose normal.     Mouth/Throat:     Mouth: Mucous membranes are moist.  Cardiovascular:     Rate and Rhythm: Normal rate and regular rhythm.     Pulses: Normal pulses.     Heart sounds: Normal heart sounds.  Pulmonary:     Effort: Pulmonary effort is normal.     Breath sounds: Normal breath sounds.  Musculoskeletal:        General: Tenderness present.     Cervical back: Normal range of motion and neck supple.  Skin:    General: Skin is warm and dry.  Neurological:     Mental Status: She is alert and oriented to person, place, and time.  Psychiatric:        Mood and Affect: Mood normal.        Behavior: Behavior normal.      No results found for any visits on 12/20/23.  Recent Results (from the past 2160 hours)  Food Allergy  Profile     Status: None   Collection Time: 10/21/23  2:30 PM  Result Value Ref Range   Egg White IgE <0.10 Class 0 kU/L   Peanut IgE <0.10 Class 0 kU/L   Soybean IgE <0.10 Class 0 kU/L   Milk IgE <0.10 Class 0 kU/L   Clam IgE <0.10 Class 0 kU/L   Shrimp IgE <0.10 Class 0 kU/L   Walnut IgE <0.10 Class 0 kU/L   Codfish IgE <0.10 Class 0 kU/L   Scallop IgE <0.10 Class 0 kU/L   Wheat IgE <0.10 Class 0 kU/L   Allergen Corn, IgE <0.10 Class 0 kU/L   Sesame Seed IgE <0.10 Class 0 kU/L  Alpha-Gal Panel     Status: None   Collection Time: 10/21/23  2:30 PM  Result Value Ref Range   Class Description Allergens Comment     Comment:     Levels of Specific IgE       Class  Description of  Class     ---------------------------  -----  --------------------                    < 0.10         0         Negative            0.10 -    0.31         0/I       Equivocal/Low            0.32 -    0.55         I         Low            0.56 -    1.40         II        Moderate  1.41 -    3.90         III       High            3.91 -   19.00         IV        Very High           19.01 -  100.00         V         Very High                   >100.00         VI        Very High    IgE (Immunoglobulin E), Serum 39 6 - 495 IU/mL   Pork IgE <0.10 Class 0 kU/L   Beef IgE <0.10 Class 0 kU/L   Allergen Lamb IgE <0.10 Class 0 kU/L   O215-IgE Alpha-Gal <0.10 Class 0 kU/L  Chronic Urticaria (CU) Eval     Status: Abnormal   Collection Time: 10/21/23  2:30 PM  Result Value Ref Range   ALT 11 0 - 32 IU/L   TSH 3.070 0.450 - 4.500 uIU/mL   Thyroperoxidase Ab SerPl-aCnc 10 0 - 34 IU/mL   CRP 19 (H) 0 - 10 mg/L   Pooled Donor- BAT CU 3.00 0.00 - 10.60 %   WBC 5.3 3.4 - 10.8 x10E3/uL   RBC 4.48 3.77 - 5.28 x10E6/uL   Hemoglobin 11.6 11.1 - 15.9 g/dL   Hematocrit 62.1 65.9 - 46.6 %   MCV 84 79 - 97 fL   MCH 25.9 (L) 26.6 - 33.0 pg   MCHC 30.7 (L) 31.5 - 35.7 g/dL   RDW 85.2 88.2 - 84.5 %   Platelets 142 (L) 150 - 450 x10E3/uL   Neutrophils 51 Not Estab. %   Lymphs 40 Not Estab. %   Monocytes 7 Not Estab. %   Eos 2 Not Estab. %   Basos 0 Not Estab. %   Neutrophils Absolute 2.7 1.4 - 7.0 x10E3/uL   Lymphocytes Absolute 2.1 0.7 - 3.1 x10E3/uL   Monocytes Absolute 0.4 0.1 - 0.9 x10E3/uL   EOS (ABSOLUTE) 0.1 0.0 - 0.4 x10E3/uL   Basophils Absolute 0.0 0.0 - 0.2 x10E3/uL   Immature Granulocytes 0 Not Estab. %   Immature Grans (Abs) 0.0 0.0 - 0.1 x10E3/uL   Sed Rate 48 (H) 0 - 32 mm/hr  Basic metabolic panel     Status: Abnormal   Collection Time: 10/24/23  2:05 PM  Result Value Ref Range   Sodium 137 135 - 145 mmol/L   Potassium 4.6 3.5 - 5.1 mmol/L   Chloride 103 98 - 111 mmol/L    CO2 20 (L) 22 - 32 mmol/L   Glucose, Bld 84 70 - 99 mg/dL    Comment: Glucose reference range applies only to samples taken after fasting for at least 8 hours.   BUN 10 6 - 20 mg/dL   Creatinine, Ser 9.33 0.44 - 1.00 mg/dL   Calcium 9.2 8.9 - 89.6 mg/dL   GFR, Estimated >39 >39 mL/min    Comment: (NOTE) Calculated using the CKD-EPI Creatinine Equation (2021)    Anion gap 14 5 - 15    Comment: Performed at [facility], [address] [place], [place] [address]  CBC     Status: Abnormal   Collection Time: 10/24/23  2:05 PM  Result Value Ref Range  WBC 5.7 4.0 - 10.5 K/uL   RBC 4.40 3.87 - 5.11 MIL/uL   Hemoglobin 11.7 (L) 12.0 - 15.0 g/dL   HCT 63.0 63.9 - 53.9 %   MCV 83.9 80.0 - 100.0 fL   MCH 26.6 26.0 - 34.0 pg   MCHC 31.7 30.0 - 36.0 g/dL   RDW 84.3 (H) 88.4 - 84.4 %   Platelets 317 150 - 400 K/uL   nRBC 0.0 0.0 - 0.2 %    Comment: Performed at [facility], [address] [place], [place] [address]  Troponin I (High Sensitivity)     Status: None   Collection Time: 10/24/23  2:05 PM  Result Value Ref Range   Troponin I (High Sensitivity) <2 <18 ng/L    Comment: (NOTE) Elevated high sensitivity troponin I (hsTnI) values and significant  changes across serial measurements may suggest ACS but many other  chronic and acute conditions are known to elevate hsTnI results.  Refer to the Links section for chest pain algorithms and additional  guidance. Performed at [facility], [address], [place], [place] [address]   D-dimer, quantitative     Status: Abnormal   Collection Time: 10/24/23  2:05 PM  Result Value Ref Range   D-Dimer, Quant 0.60 (H) 0.00 - 0.50 ug/mL-FEU    Comment: (NOTE) At the manufacturer cut-off value of 0.5 g/mL FEU, this assay has a negative predictive value of 95-100%.This assay is intended for use in conjunction with a clinical pretest probability (PTP) assessment model to exclude pulmonary embolism (PE) and deep venous thrombosis (DVT) in  outpatients suspected of PE or DVT. Results should be correlated with clinical presentation. Performed at [facility], [address], [place], [place] [address]   Hepatic function panel     Status: None   Collection Time: 10/24/23  2:05 PM  Result Value Ref Range   Total Protein 7.9 6.5 - 8.1 g/dL   Albumin 3.9 3.5 - 5.0 g/dL   AST 15 15 - 41 U/L   ALT 14 0 - 44 U/L   Alkaline Phosphatase 63 38 - 126 U/L   Total Bilirubin 0.7 0.0 - 1.2 mg/dL   Bilirubin, Direct 0.1 0.0 - 0.2 mg/dL   Indirect Bilirubin 0.6 0.3 - 0.9 mg/dL    Comment: Performed at [facility], [address], [place], [place] [address]  Lipase, blood     Status: None   Collection Time: 10/24/23  2:05 PM  Result Value Ref Range   Lipase 26 11 - 51 U/L    Comment: Performed at [facility], [address] [place], [place] [address]  Troponin I (High Sensitivity)     Status: None   Collection Time: 10/24/23  3:50 PM  Result Value Ref Range   Troponin I (High Sensitivity) <2 <18 ng/L    Comment: (NOTE) Elevated high sensitivity troponin I (hsTnI) values and significant  changes across serial measurements may suggest ACS but many other  chronic and acute conditions are known to elevate hsTnI results.  Refer to the Links section for chest pain algorithms and additional  guidance. Performed at [facility], [name] [place], [place] [address]   Brain natriuretic peptide     Status: None   Collection Time: 10/24/23  3:50 PM  Result Value Ref Range   B Natriuretic Peptide 26.0 0.0 - 100.0 pg/mL    Comment: Performed at [facility], [address] [place], [place] [address]  CBG monitoring, ED  Status: Abnormal   Collection Time: 11/07/23  4:26 PM  Result Value Ref Range   Glucose-Capillary 60 (L) 70 - 99 mg/dL    Comment: Glucose reference range applies only to samples taken after fasting for at least 8 hours.  CBG monitoring, ED     Status: Abnormal   Collection Time: 11/07/23  5:02 PM  Result Value  Ref Range   Glucose-Capillary 69 (L) 70 - 99 mg/dL    Comment: Glucose reference range applies only to samples taken after fasting for at least 8 hours.  Urinalysis, w/ Reflex to Culture (Infection Suspected) -Urine, Unspecified Source     Status: Abnormal   Collection Time: 11/07/23  7:06 PM  Result Value Ref Range   Specimen Source URINE, UNSPE    Color, Urine YELLOW YELLOW   APPearance CLEAR CLEAR   Specific Gravity, Urine 1.010 1.005 - 1.030   pH 6.0 5.0 - 8.0   Glucose, UA NEGATIVE NEGATIVE mg/dL   Hgb urine dipstick SMALL (A) NEGATIVE   Bilirubin Urine NEGATIVE NEGATIVE   Ketones, ur NEGATIVE NEGATIVE mg/dL   Protein, ur NEGATIVE NEGATIVE mg/dL   Nitrite NEGATIVE NEGATIVE   Leukocytes,Ua NEGATIVE NEGATIVE   RBC / HPF 0-5 0 - 5 RBC/hpf   WBC, UA 0-5 0 - 5 WBC/hpf    Comment:        Reflex urine culture not performed if WBC <=10, OR if Squamous epithelial cells >5. If Squamous epithelial cells >5 suggest recollection.    Bacteria, UA NONE SEEN NONE SEEN   Squamous Epithelial / HPF 0-5 0 - 5 /HPF   Mucus PRESENT    Amorphous Crystal PRESENT     Comment: Performed at [facility], [address], [place], [place] [address]  CBC with Differential     Status: Abnormal   Collection Time: 11/07/23  8:14 PM  Result Value Ref Range   WBC 5.9 4.0 - 10.5 K/uL   RBC 4.22 3.87 - 5.11 MIL/uL   Hemoglobin 10.9 (L) 12.0 - 15.0 g/dL   HCT 64.8 (L) 63.9 - 53.9 %   MCV 83.2 80.0 - 100.0 fL   MCH 25.8 (L) 26.0 - 34.0 pg   MCHC 31.1 30.0 - 36.0 g/dL   RDW 84.8 88.4 - 84.4 %   Platelets 343 150 - 400 K/uL   nRBC 0.0 0.0 - 0.2 %   Neutrophils Relative % 53 %   Neutro Abs 3.1 1.7 - 7.7 K/uL   Lymphocytes Relative 37 %   Lymphs Abs 2.2 0.7 - 4.0 K/uL   Monocytes Relative 10 %   Monocytes Absolute 0.6 0.1 - 1.0 K/uL   Eosinophils Relative 0 %   Eosinophils Absolute 0.0 0.0 - 0.5 K/uL   Basophils Relative 0 %   Basophils Absolute 0.0 0.0 - 0.1 K/uL   WBC Morphology ATYPICAL LYMPHOCYTES  PRESENT    RBC Morphology MORPHOLOGY UNREMARKABLE    Smear Review MORPHOLOGY UNREMARKABLE    Abs Immature Granulocytes 0.00 0.00 - 0.07 K/uL    Comment: Performed at [facility], [address] [place] [address], [place], [place] [address]  Comprehensive metabolic panel     Status: Abnormal   Collection Time: 11/07/23  8:14 PM  Result Value Ref Range   Sodium 135 135 - 145 mmol/L   Potassium 4.1 3.5 - 5.1 mmol/L   Chloride 99 98 - 111 mmol/L   CO2 24 22 - 32 mmol/L   Glucose, Bld 82 70 - 99 mg/dL    Comment: Glucose  reference range applies only to samples taken after fasting for at least 8 hours.   BUN 10 6 - 20 mg/dL   Creatinine, Ser 9.35 0.44 - 1.00 mg/dL   Calcium 9.1 8.9 - 89.6 mg/dL   Total Protein 7.2 6.5 - 8.1 g/dL   Albumin 3.5 3.5 - 5.0 g/dL   AST 11 (L) 15 - 41 U/L   ALT 13 0 - 44 U/L   Alkaline Phosphatase 67 38 - 126 U/L   Total Bilirubin 0.6 0.0 - 1.2 mg/dL   GFR, Estimated >39 >39 mL/min    Comment: (NOTE) Calculated using the CKD-EPI Creatinine Equation (2021)    Anion gap 12 5 - 15    Comment: Performed at [facility], [address] [place], [place] [address]  TSH     Status: None   Collection Time: 11/07/23  8:14 PM  Result Value Ref Range   TSH 2.391 0.350 - 4.500 uIU/mL    Comment: Performed by a 3rd Generation assay with a functional sensitivity of <=0.01 uIU/mL. Performed at [facility], [address], [name], [place] [address]   T4, free     Status: None   Collection Time: 11/07/23  8:45 PM  Result Value Ref Range   Free T4 0.90 0.61 - 1.12 ng/dL    Comment: (NOTE) Biotin ingestion may interfere with free T4 tests. If the results are inconsistent with the TSH level, previous test results, or the clinical presentation, then consider biotin interference. If needed, order repeat testing after stopping biotin. Performed at [facility], [address] [place], [place] [address]   POCT urinalysis dipstick     Status: Abnormal   Collection Time: 11/14/23   3:16 PM  Result Value Ref Range   Color, UA yellow yellow   Clarity, UA cloudy (A) clear   Glucose, UA negative negative mg/dL   Bilirubin, UA negative negative   Ketones, POC UA negative negative mg/dL   Spec Grav, UA >=8.969 (A) 1.010 - 1.025   Blood, UA moderate (A) negative   pH, UA 5.5 5.0 - 8.0   Protein Ur, POC negative negative mg/dL   Urobilinogen, UA 0.2 0.2 or 1.0 E.U./dL   Nitrite, UA Negative Negative   Leukocytes, UA Negative Negative  CBC with Differential     Status: Abnormal   Collection Time: 11/14/23  8:37 PM  Result Value Ref Range   WBC 6.7 4.0 - 10.5 K/uL   RBC 3.94 3.87 - 5.11 MIL/uL   Hemoglobin 10.6 (L) 12.0 - 15.0 g/dL   HCT 66.1 (L) 63.9 - 53.9 %   MCV 85.8 80.0 - 100.0 fL   MCH 26.9 26.0 - 34.0 pg   MCHC 31.4 30.0 - 36.0 g/dL   RDW 84.6 88.4 - 84.4 %   Platelets 302 150 - 400 K/uL   nRBC 0.0 0.0 - 0.2 %   Neutrophils Relative % 54 %   Neutro Abs 3.6 1.7 - 7.7 K/uL   Lymphocytes Relative 36 %   Lymphs Abs 2.4 0.7 - 4.0 K/uL   Monocytes Relative 9 %   Monocytes Absolute 0.6 0.1 - 1.0 K/uL   Eosinophils Relative 1 %   Eosinophils Absolute 0.1 0.0 - 0.5 K/uL   Basophils Relative 0 %   Basophils Absolute 0.0 0.0 - 0.1 K/uL   Immature Granulocytes 0 %   Abs Immature Granulocytes 0.03 0.00 - 0.07 K/uL    Comment: Performed at [facility], [address] [place],  [place] [address]  Comprehensive metabolic panel     Status: Abnormal   Collection Time: 11/14/23  8:37 PM  Result Value Ref Range   Sodium 140 135 - 145 mmol/L   Potassium 3.8 3.5 - 5.1 mmol/L   Chloride 103 98 - 111 mmol/L   CO2 26 22 - 32 mmol/L   Glucose, Bld 118 (H) 70 - 99 mg/dL    Comment: Glucose reference range applies only to samples taken after fasting for at least 8 hours.   BUN 12 6 - 20 mg/dL   Creatinine, Ser 9.23 0.44 - 1.00 mg/dL   Calcium 9.1 8.9 - 89.6 mg/dL   Total Protein 7.2 6.5 - 8.1 g/dL   Albumin 3.4 (L) 3.5 - 5.0 g/dL   AST 14 (L) 15 - 41 U/L   ALT 14 0 -  44 U/L   Alkaline Phosphatase 60 38 - 126 U/L   Total Bilirubin 0.5 0.0 - 1.2 mg/dL   GFR, Estimated >39 >39 mL/min    Comment: (NOTE) Calculated using the CKD-EPI Creatinine Equation (2021)    Anion gap 11 5 - 15    Comment: Performed at [facility], [address] [place], [place] [address]  Lipase, blood     Status: None   Collection Time: 11/14/23  8:37 PM  Result Value Ref Range   Lipase 30 11 - 51 U/L    Comment: Performed at [facility], [address] [place], [place] [address]  hCG, serum, qualitative     Status: None   Collection Time: 11/14/23  8:37 PM  Result Value Ref Range   Preg, Serum NEGATIVE NEGATIVE    Comment:        THE SENSITIVITY OF THIS METHODOLOGY IS >10 mIU/mL. Performed at [facility], [address] [place], [place] [address]   Urine Culture     Status: Abnormal   Collection Time: 11/14/23 10:15 PM   Specimen: Urine, Clean Catch  Result Value Ref Range   Specimen Description      URINE, CLEAN CATCH Performed at [facility], [address] [place], [place] [address]    Special Requests      NONE Performed at [facility], [address] [place], [place] [address]    Culture MULTIPLE SPECIES PRESENT, SUGGEST RECOLLECTION (A)    Report Status 11/15/2023 FINAL   Urinalysis, Routine w reflex microscopic -Urine, Clean Catch     Status: Abnormal   Collection Time: 11/14/23 10:15 PM  Result Value Ref Range   Color, Urine YELLOW YELLOW   APPearance HAZY (A) CLEAR   Specific Gravity, Urine 1.029 1.005 - 1.030   pH 5.0 5.0 - 8.0   Glucose, UA NEGATIVE NEGATIVE mg/dL   Hgb urine dipstick SMALL (A) NEGATIVE   Bilirubin Urine NEGATIVE NEGATIVE   Ketones, ur NEGATIVE NEGATIVE mg/dL   Protein, ur NEGATIVE NEGATIVE mg/dL   Nitrite NEGATIVE NEGATIVE   Leukocytes,Ua NEGATIVE NEGATIVE   RBC / HPF 6-10 0 - 5 RBC/hpf   WBC, UA 0-5 0 - 5 WBC/hpf   Bacteria, UA RARE (A) NONE SEEN   Squamous Epithelial / HPF 11-20 0 - 5 /HPF   Mucus PRESENT    Ca Oxalate  Crys, UA PRESENT     Comment: Performed at [facility], [address] [place], [place] [address]  Lipase, blood     Status: None   Collection Time: 12/05/23  3:45 PM  Result Value Ref Range   Lipase 25 11 - 51 U/L  Comment: Performed at [facility], [address], [place], [place] [address]  Comprehensive metabolic panel     Status: Abnormal   Collection Time: 12/05/23  3:45 PM  Result Value Ref Range   Sodium 137 135 - 145 mmol/L   Potassium 4.1 3.5 - 5.1 mmol/L   Chloride 102 98 - 111 mmol/L   CO2 25 22 - 32 mmol/L   Glucose, Bld 87 70 - 99 mg/dL    Comment: Glucose reference range applies only to samples taken after fasting for at least 8 hours.   BUN 11 6 - 20 mg/dL   Creatinine, Ser 9.37 0.44 - 1.00 mg/dL   Calcium 9.0 8.9 - 89.6 mg/dL   Total Protein 6.8 6.5 - 8.1 g/dL   Albumin 3.5 3.5 - 5.0 g/dL   AST 10 (L) 15 - 41 U/L   ALT 11 0 - 44 U/L   Alkaline Phosphatase 61 38 - 126 U/L   Total Bilirubin 0.4 0.0 - 1.2 mg/dL   GFR, Estimated >39 >39 mL/min    Comment: (NOTE) Calculated using the CKD-EPI Creatinine Equation (2021)    Anion gap 10 5 - 15    Comment: Performed at [facility], [address] [place], [place] [address]  CBC     Status: Abnormal   Collection Time: 12/05/23  3:45 PM  Result Value Ref Range   WBC 5.8 4.0 - 10.5 K/uL   RBC 4.07 3.87 - 5.11 MIL/uL   Hemoglobin 11.0 (L) 12.0 - 15.0 g/dL   HCT 65.0 (L) 63.9 - 53.9 %   MCV 85.7 80.0 - 100.0 fL   MCH 27.0 26.0 - 34.0 pg   MCHC 31.5 30.0 - 36.0 g/dL   RDW 84.8 88.4 - 84.4 %   Platelets 330 150 - 400 K/uL   nRBC 0.0 0.0 - 0.2 %    Comment: Performed at [facility], [address], [place], [place] [address]  Urinalysis, Routine w reflex microscopic -Urine, Clean Catch     Status: Abnormal   Collection Time: 12/05/23  4:57 PM  Result Value Ref Range   Color, Urine STRAW (A) YELLOW   APPearance CLEAR CLEAR   Specific Gravity, Urine 1.016 1.005 - 1.030   pH 6.0 5.0 - 8.0   Glucose, UA NEGATIVE  NEGATIVE mg/dL   Hgb urine dipstick MODERATE (A) NEGATIVE   Bilirubin Urine NEGATIVE NEGATIVE   Ketones, ur NEGATIVE NEGATIVE mg/dL   Protein, ur NEGATIVE NEGATIVE mg/dL   Nitrite NEGATIVE NEGATIVE   Leukocytes,Ua NEGATIVE NEGATIVE   RBC / HPF 0-5 0 - 5 RBC/hpf   WBC, UA 0-5 0 - 5 WBC/hpf   Bacteria, UA NONE SEEN NONE SEEN   Squamous Epithelial / HPF 0-5 0 - 5 /HPF   Mucus PRESENT     Comment: Performed at [facility], [address], [place], [place] [address]  Urinalysis, Routine w reflex microscopic -Urine, Clean Catch     Status: Abnormal   Collection Time: 12/16/23  1:29 PM  Result Value Ref Range   Color, Urine YELLOW YELLOW   APPearance CLEAR CLEAR   Specific Gravity, Urine 1.014 1.005 - 1.030   pH 6.0 5.0 - 8.0   Glucose, UA NEGATIVE NEGATIVE mg/dL   Hgb urine dipstick MODERATE (A) NEGATIVE   Bilirubin Urine NEGATIVE NEGATIVE   Ketones, ur NEGATIVE NEGATIVE mg/dL   Protein, ur NEGATIVE NEGATIVE mg/dL   Nitrite NEGATIVE NEGATIVE   Leukocytes,Ua NEGATIVE NEGATIVE   RBC / HPF 0-5 0 -  5 RBC/hpf   WBC, UA 0-5 0 - 5 WBC/hpf   Bacteria, UA NONE SEEN NONE SEEN   Squamous Epithelial / HPF 0-5 0 - 5 /HPF    Comment: Performed at [facility], [address], [place], [place] [address]  Urinalysis, Routine w reflex microscopic -Urine, Clean Catch     Status: Abnormal   Collection Time: 12/19/23  3:25 PM  Result Value Ref Range   Color, Urine YELLOW YELLOW   APPearance CLEAR CLEAR   Specific Gravity, Urine 1.023 1.005 - 1.030   pH 6.0 5.0 - 8.0   Glucose, UA NEGATIVE NEGATIVE mg/dL   Hgb urine dipstick MODERATE (A) NEGATIVE   Bilirubin Urine NEGATIVE NEGATIVE   Ketones, ur NEGATIVE NEGATIVE mg/dL   Protein, ur NEGATIVE NEGATIVE mg/dL   Nitrite NEGATIVE NEGATIVE   Leukocytes,Ua NEGATIVE NEGATIVE   RBC / HPF 6-10 0 - 5 RBC/hpf   WBC, UA 0-5 0 - 5 WBC/hpf   Bacteria, UA NONE SEEN NONE SEEN   Squamous Epithelial / HPF 0-5 0 - 5 /HPF   Mucus PRESENT     Comment: Performed at  [facility], [address], [place], [place] [address]  Pregnancy, urine     Status: None   Collection Time: 12/19/23  3:25 PM  Result Value Ref Range   Preg Test, Ur NEGATIVE NEGATIVE    Comment:        THE SENSITIVITY OF THIS METHODOLOGY IS >20 mIU/mL. Performed at [facility], [address], [place], [place] [address]       Assessment & Plan: 1) Lumbar spine pain - LS xray at [facility] today if possible 2) Stop naproxen /IBU - ketorlac instead 3) Stop methocarbamol  - cyclobenzaprine  instead 4) Work note to sit when needed and not lift > 5 lbs x 2 weeks   Problem List Items Addressed This Visit   None   No follow-ups on file.   Total time spent: 20 minutes  Neale Carpen, NP  12/20/2023   This document may have been prepared by [facility] Voice Recognition software and as such may include unintentional dictation errors.

## 2023-12-21 ENCOUNTER — Other Ambulatory Visit: Payer: Self-pay

## 2023-12-21 ENCOUNTER — Other Ambulatory Visit (HOSPITAL_COMMUNITY): Payer: Self-pay

## 2023-12-21 NOTE — Progress Notes (Signed)
 Specialty Pharmacy Refill Coordination Note  Spoke with Jessica Sanchez is a 38 y.o. female contacted today regarding refills of specialty medication(s) Omalizumab  (Xolair )  Doses on hand: 0  Injection date: 12/26/23   Patient requested: Courier to Provider Office   Delivery date: 12/22/23   Verified address: [facility] [address] [place] [address]  Medication will be filled on 12/21/23.

## 2023-12-22 ENCOUNTER — Other Ambulatory Visit: Admitting: Psychology

## 2023-12-26 ENCOUNTER — Ambulatory Visit (INDEPENDENT_AMBULATORY_CARE_PROVIDER_SITE_OTHER)

## 2023-12-26 DIAGNOSIS — L501 Idiopathic urticaria: Secondary | ICD-10-CM

## 2023-12-30 ENCOUNTER — Telehealth: Payer: Self-pay | Admitting: Family Medicine

## 2023-12-30 NOTE — Telephone Encounter (Signed)
 [employer] forms  Noted Copied Sleeved Original placed provider box (chelsa)-last seen on 08.19.2025 Copy form front desk folder

## 2023-12-30 NOTE — Telephone Encounter (Signed)
 Updated: gloria patient

## 2024-01-03 ENCOUNTER — Ambulatory Visit: Admitting: Psychology

## 2024-01-03 ENCOUNTER — Other Ambulatory Visit: Payer: Self-pay | Admitting: Nurse Practitioner

## 2024-01-03 ENCOUNTER — Ambulatory Visit: Payer: Self-pay

## 2024-01-03 DIAGNOSIS — F321 Major depressive disorder, single episode, moderate: Secondary | ICD-10-CM

## 2024-01-03 DIAGNOSIS — M545 Low back pain, unspecified: Secondary | ICD-10-CM

## 2024-01-03 NOTE — Progress Notes (Signed)
 DAP note              Client name: Jessica Sanchez Therapist name: Jessica Sanchez SILK Date: 11/09/2023 Time: 60 mins   Data: Client reports that she had to go back to the ER two days ago. She reports that she was at work and just started feeling faint and weak. She states that she must have looked like she was not feeling good because they sent her home and encouraged her to go to the ER and so she did. She states that they did not find anything but that the plan is that she will go see some specialist to try and figure out what is going on. Client reports depressive symptoms are at a 5 and would like to get that to a 2 with the use of interventions.   Assessment: The client presented very overwhelmed. She is concerned about her health as she does not understand what is happening. On top of that she is worried about work because if she does not work she does not make money, on top of that she is worried that she will loosed her job. We discussed the importance of focusing on her needs and health because she definitely can not work if she does not take care of herself.     Plan: The client will attend bi-weekly sessions with a goal of attending at least 2 per month. Client will take a moment of mindfulness daily and utilize box breathing as needed to reduce symptoms. Work on healing past trauma by figuring out triggers, to help with symptom reduction, emotional regulation, and processing.

## 2024-01-04 NOTE — Telephone Encounter (Signed)
Form in providers box.

## 2024-01-11 ENCOUNTER — Other Ambulatory Visit: Payer: Self-pay

## 2024-01-12 ENCOUNTER — Other Ambulatory Visit: Payer: Self-pay

## 2024-01-14 DIAGNOSIS — G4733 Obstructive sleep apnea (adult) (pediatric): Secondary | ICD-10-CM | POA: Diagnosis not present

## 2024-01-16 ENCOUNTER — Ambulatory Visit (INDEPENDENT_AMBULATORY_CARE_PROVIDER_SITE_OTHER): Admitting: Internal Medicine

## 2024-01-16 ENCOUNTER — Encounter: Payer: Self-pay | Admitting: Internal Medicine

## 2024-01-16 ENCOUNTER — Other Ambulatory Visit (HOSPITAL_COMMUNITY): Payer: Self-pay

## 2024-01-16 VITALS — BP 120/82 | HR 88 | Temp 98.2°F | Resp 18 | Wt 351.0 lb

## 2024-01-16 DIAGNOSIS — T7800XD Anaphylactic reaction due to unspecified food, subsequent encounter: Secondary | ICD-10-CM

## 2024-01-16 DIAGNOSIS — J3089 Other allergic rhinitis: Secondary | ICD-10-CM

## 2024-01-16 DIAGNOSIS — J4540 Moderate persistent asthma, uncomplicated: Secondary | ICD-10-CM | POA: Diagnosis not present

## 2024-01-16 DIAGNOSIS — L501 Idiopathic urticaria: Secondary | ICD-10-CM

## 2024-01-16 DIAGNOSIS — J302 Other seasonal allergic rhinitis: Secondary | ICD-10-CM

## 2024-01-16 MED ORDER — MONTELUKAST SODIUM 10 MG PO TABS: 10.0000 mg | ORAL_TABLET | Freq: Every day | ORAL | 5 refills | Status: DC

## 2024-01-16 MED ORDER — ALBUTEROL SULFATE HFA 108 (90 BASE) MCG/ACT IN AERS: 2.0000 | INHALATION_SPRAY | Freq: Four times a day (QID) | RESPIRATORY_TRACT | 1 refills | Status: AC | PRN

## 2024-01-16 MED ORDER — EPINEPHRINE 0.3 MG/0.3ML IJ SOAJ: 0.3000 mg | INTRAMUSCULAR | 1 refills | Status: AC | PRN

## 2024-01-16 MED ORDER — FAMOTIDINE 20 MG PO TABS: 20.0000 mg | ORAL_TABLET | Freq: Two times a day (BID) | ORAL | 5 refills | Status: DC

## 2024-01-16 MED ORDER — LEVOCETIRIZINE DIHYDROCHLORIDE 5 MG PO TABS: 10.0000 mg | ORAL_TABLET | Freq: Two times a day (BID) | ORAL | 5 refills | Status: DC | PRN

## 2024-01-16 MED ORDER — FLUTICASONE PROPIONATE 50 MCG/ACT NA SUSP: 2.0000 | Freq: Every day | NASAL | 5 refills | Status: DC

## 2024-01-16 MED ORDER — TRELEGY ELLIPTA 200-62.5-25 MCG/ACT IN AEPB: 1.0000 | INHALATION_SPRAY | Freq: Every day | RESPIRATORY_TRACT | 5 refills | Status: DC
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
## 2024-01-16 NOTE — Patient Instructions (Addendum)
 Moderate Persistent Asthma - Maintenance inhaler: restart Trelegy 200-62.5-25- 1 puff daily and continue Singulair  10mg  daily.  - Rescue inhaler: Albuterol  2 puffs via spacer or 1 vial via nebulizer every 4-6 hours as needed for respiratory symptoms of cough, shortness of breath, or wheezing Asthma control goals:  Full participation in all desired activities (may need albuterol  before activity) Albuterol  use two times or less a week on average (not counting use with activity) Cough interfering with sleep two times or less a month Oral steroids no more than once a year No hospitalizations  Allergic rhinitis - SPT 12/2021: positive to grass pollen, weed pollen, ragweed pollen, tree pollen, mold, dust mite, cat, dog, cockroach, and tobacco.  - Consider saline nasal rinses as needed for nasal symptoms. Use this before any medicated nasal sprays for best result - Continue Flonase  2 sprays in each nostril once a day. Aim upward and outward.  - Continue levocetirizine (Xyzal ) 5 mg, dosing dependent on hives.  - Continue montelukast  10 mg once a day.   - Continue cromolyn  1-2 drops in each eye 1-4 times a day if needed for red or itchy eyes - Consider allergy  shots as long term control of your symptoms by teaching your immune system to be more tolerant of your allergy  triggers  Hives (Urticaria) - At this time etiology of hives and swelling is unknown. Hives can be caused by a variety of different triggers including illness/infection, pressure, vibrations, extremes of temperature to name a few however majority of the time there is no identifiable trigger.  -Use Xyzal  5mg  twice daily and Pepcid  20mg  twice daily.  If symptoms worsen, increase to Xyzal  10mg  twice daily.  - Continue Xolair  300mg  every 4 weeks.  Keep Epipen .   Food allergy  - Continue to avoid peanut and shellfish.  - for SKIN only reaction, okay to take Xyzal  5mg  every 12 hours as needed - for SKIN + ANY additional symptoms, OR IF  concern for LIFE THREATENING reaction = Epipen  Autoinjector EpiPen  0.3 mg. - If using Epinephrine  autoinjector, call 911 or go to the ER.

## 2024-01-16 NOTE — Progress Notes (Signed)
 Comprehensive Clinical Assessment (CCA) Note   11/23/2023 Jessica Sanchez [id number]   Chief Complaint: Depressed   Visit Diagnosis: Major Moderate Depression     CCA Screening, Triage and Referral (STR)   Patient Reported Information How did you hear about us ? Friend Referral name: Jessica Sanchez Referral phone number: [phone]   Whom do you see for routine medical problems? Jessica Sanchez Practice/Facility Name: Family Medicine Practice/Facility Phone Number: [phone]  Name of Contact: Jessica Sanchez Contact Number: [phone] Contact Fax Number: No data recorded Prescriber Name: Jessica Sanchez  Prescriber Address (if known): [address] [place], [place] [address]   What Is the Reason for Your Visit/Call Today? Therapy How Long Has This Been Causing You Problems? months What Do You Feel Would Help You the Most Today? Talk it out, set goals   Have You Recently Been in Any Inpatient Treatment (Hospital/Detox/Crisis Center/28-Day Program)? No Name/Location of Program/Hospital:N/A How Long Were You There? N/A When Were You Discharged? N/A   Have You Ever Received Services From [facility] Before? Yes Who Do You See at [facility]? Emergency services and specialists   Have You Recently Had Any Thoughts About Hurting Yourself? No Are You Planning to Commit Suicide/Harm Yourself At This time? No   Have you Recently Had Thoughts About Hurting Someone Jessica Sanchez? No Explanation: N/A   Have You Used Any Alcohol or Drugs in the Past 24 Hours? No How Long Ago Did You Use Drugs or Alcohol? Only drink on special occasions What Did You Use and How Much? Alcohol    Do You Currently Have a Therapist/Psychiatrist? Yes, therapist Name of Therapist/Psychiatrist: Camie Almarie Higinio Sanchez, LCSWA   Have You Been Recently Discharged From Any Office Practice or Programs? No Explanation of Discharge From Practice/Program: N/A                CCA Screening Triage Referral  Assessment Type of Contact: In person Is this Initial or Reassessment? Reassessment  Date Telepsych consult ordered in [facility]:   11/23/2023  Time Telepsych consult ordered in [facility]:  11 am   Patient Reported Information Reviewed? Yes Patient Left Without Being Seen? No Reason for Not Completing Assessment: Completed   Collateral Involvement: No   Does Patient Have a Court Appointed Legal Guardian? No Name and Contact of Legal Guardian: N/A If Minor and Not Living with Parent(s), Who has Custody? N/A Is CPS involved or ever been involved? N/A Is APS involved or ever been involved? No   Patient Determined To Be At Risk for Harm To Self or Others Based on Review of Patient Reported Information or Presenting Complaint? No Method: N/A Availability of Means: N/A Intent: N/A Notification Required: N/A Additional Information for Danger to Others Potential: None Additional Comments for Danger to Others Potential: N/A Are There Guns or Other Weapons in Your Home? N/A Types of Guns/Weapons: N/A Are These Weapons Safely Secured? N/A                                                  Who Could Verify You Are Able To Have These Secured: N/A Do You Have any Outstanding Charges, Pending Court Dates, Parole/Probation? No Contacted To Inform of Risk of Harm To Self or Others: No   Location of Assessment: [facility]   Does Patient Present under Involuntary Commitment? No IVC Papers Initial File Date:  N/A   [place] of Residence: [place]   Patient Currently Receiving the Following Services: Therapy   Determination of Need: None    Options For Referral: None at this time       CCA Biopsychosocial Intake/Chief Complaint:  Major Moderate Depression Current Symptoms/Problems: Major Moderate Depression   Patient Reported Schizophrenia/Schizoaffective Diagnosis in Past: No   Strengths: Resilient, motivated, caring, kind, resourceful Preferences: stability, patience, understanding Abilities:  effective communicator, problem solver, motivator, team player   Type of Services Patient Feels are Needed: Therapy   Initial Clinical Notes/Concerns: [name] is kind. She is motivated to get things done. She has been through a lot and keeps pushing. We are working on her past trauma and what she is going through daily. She has depression but always pushes through.    Mental Health Symptoms Depression:  Yes, sad, feelings of hopelessness, lack of motivation, tearful  Duration of Depressive symptoms: years  Mania:  None  Anxiety:   Yes at times, worries a lot, trouble sleeping  Psychosis:  None  Duration of Psychotic symptoms: N/A  Trauma:  Yes, dad and grandma were very strict and sheltered the client, mom not involved, sister is an active drug user, client had to have a hysterectomy at a young age.  Obsessions:  None  Compulsions:  None  Inattention:  No  Hyperactivity/Impulsivity:  No  Oppositional/Defiant Behaviors:  No  Emotional Irregularity:  No  Other Mood/Personality Symptoms: No    Mental Status Exam Appearance and self-care  Stature:  5 foot 1 inch  Weight:  345 lbs.  Clothing:  Clean  Grooming:  Well groomed  Cosmetic use:  None  Posture/gait:  Normal range  Motor activity:  Normal range  Sensorium  Attention:  Normal range  Concentration:  Normal range  Orientation:  A + O  Recall/memory:  Normal range  Affect and Mood  Affect:  Euthymic   Mood:  Pleasant  Relating  Eye contact:  Normal range  Facial expression:  Normal range  Attitude toward examiner:  Pleasant  Thought and Language  Speech flow: Normal range  Thought content:  Normal range  Preoccupation:  Normal range  Hallucinations:  None  Organization:  Normal range  [employer] of Knowledge:  Normal range  Intelligence:  Normal range  Abstraction:  Normal range  Judgement:  Normal range  Reality Testing:  Normal range  Insight:  Normal range  Decision Making:  Normal range   Social Functioning  Social Maturity:  Normal range  Social Judgement:  Normal range  Stress  Stressors:  Actuary, family  Coping Ability:  Normal range  Skill Deficits:  confidence  Supports:  Family, friends      Religion: Believes in God     Leisure/Recreation: enjoys time with friends, hanging out, going out to eat, going to church, hanging with grandma     Exercise/Diet: Limited exercise, poor diet, eats what she can afford       CCA Employment/Education Employment/Work Situation: Quarry manager part-time     Education: [employer] diploma   CCA Family/Childhood History Family and Relationship History: Aquanetta had a hard childhood. Grandmother and father were very strict and hard on her. She was very sheltered. She has had a series of health issues that have come throughout her life. She had a hysterectomy a few years ago and that took away the ability to have children which was devastating. She has an older sister that she lives with and that is  going really well. They support each other. Her younger sister is an addict and they do not have a lot to do with her.      Childhood History:      Child/Adolescent Assessment:     CCA Substance Use Alcohol/Drug Use: None         ASAM's:  Six Dimensions of Multidimensional Assessment   Dimension 1:  Acute Intoxication and/or Withdrawal Potential:    Dimension 2:  Biomedical Conditions and Complications:    Dimension 3:  Emotional, Behavioral, or Cognitive Conditions and Complications:     Dimension 4:  Readiness to Change:     Dimension 5:  Relapse, Continued use, or Continued Problem Potential:     Dimension 6:  Recovery/Living Environment:     ASAM Severity Score:    ASAM Recommended Level of Treatment:      Substance use Disorder (SUD)   Recommendations for Services/Supports/Treatments:   DSM5 Diagnoses:     Patient Active Problem List    Diagnosis Date Noted   Acute wrist pain, left 08/05/2023    Idiopathic urticaria 07/22/2023   Seasonal allergic conjunctivitis 07/22/2023   Fibroids 01/26/2023   S/P hysterectomy 01/19/2023   Prediabetes 01/12/2023   Intractable chronic migraine with aura and without status migrainosus 12/20/2022   Iron deficiency anemia due to chronic blood loss 12/20/2022   Essential hypertension 11/01/2022   DUB (dysfunctional uterine bleeding) 11/01/2022   Moderate persistent asthma without complication 10/27/2022   Peripheral neuropathy 09/28/2022   Headache 07/01/2022   Screening for STD (sexually transmitted disease) 03/30/2022   Abdominal pain 03/02/2022   RUQ pain 02/23/2022   Abnormal abdominal CT scan 02/23/2022   Constipation 02/11/2022   Mild persistent asthma without complication 02/03/2022   Chronic urticaria 02/03/2022   Seasonal and perennial allergic rhinitis 02/03/2022   Anaphylactic shock due to adverse food reaction 02/03/2022   Need for immunization against influenza 01/27/2022   OSA (obstructive sleep apnea) 11/23/2021   Otitis media of left ear 10/26/2021   Chronic venous insufficiency of lower extremity 10/26/2021   Allergy  10/26/2021   Encounter for IUD removal     Menorrhagia 09/04/2020   Elevated troponin     Attempted IUD removal, unsuccessful 07/05/2019   Encounter for IUD insertion 07/05/2019   Gastroesophageal reflux disease     Nonspecific chest pain 11/11/2018   Morbid obesity with body mass index (BMI) of 60.0 to 69.9 in adult (HCC) 11/11/2018   Elevated d-dimer 11/11/2018   SVT (supraventricular tachycardia) (HCC) 10/04/2018   IUD (intrauterine device) in place 07/11/2015   Other disorder of menstruation and other abnormal bleeding from female genital tract 10/30/2012      Patient Centered Plan: Patient is on the following Treatment Plan(s):  Major Moderate Depression     Referrals to Alternative Service(s): Referred to Alternative Service(s):   Place:   Date:   Time:    Referred to Alternative Service(s):    Place:   Date:   Time:    Referred to Alternative Service(s):   Place:   Date:   Time:    Referred to Alternative Service(s):   Place:   Date:   Time:        Collaboration of Care: None   Patient/Guardian was advised Release of Information must be obtained prior to any record release in order to collaborate their care with an outside provider. Patient/Guardian was advised if they have not already done so to contact the registration department to sign all necessary forms  in order for us  to release information regarding their care.    Consent: Patient/Guardian gives verbal consent for treatment and assignment of benefits for services provided during this visit. Patient/Guardian expressed understanding and agreed to proceed.    Jessica Norris T [place], LCSWA

## 2024-01-18 ENCOUNTER — Other Ambulatory Visit: Payer: Self-pay

## 2024-01-18 ENCOUNTER — Ambulatory Visit: Attending: Nurse Practitioner | Admitting: Physical Therapy

## 2024-01-18 ENCOUNTER — Encounter: Payer: Self-pay | Admitting: Physical Therapy

## 2024-01-18 DIAGNOSIS — M5459 Other low back pain: Secondary | ICD-10-CM | POA: Insufficient documentation

## 2024-01-18 DIAGNOSIS — M25552 Pain in left hip: Secondary | ICD-10-CM | POA: Diagnosis not present

## 2024-01-18 DIAGNOSIS — M6281 Muscle weakness (generalized): Secondary | ICD-10-CM | POA: Insufficient documentation

## 2024-01-18 DIAGNOSIS — M545 Low back pain, unspecified: Secondary | ICD-10-CM | POA: Diagnosis not present

## 2024-01-18 DIAGNOSIS — R2689 Other abnormalities of gait and mobility: Secondary | ICD-10-CM | POA: Diagnosis not present

## 2024-01-18 NOTE — Therapy (Signed)
 OUTPATIENT PHYSICAL THERAPY THORACOLUMBAR EVALUATION   Patient Name: Jessica Sanchez MRN: [id number] DOB:November 20, 1985, 38 y.o., female Today's Date: 01/18/2024  END OF SESSION:  PT End of Session - 01/18/24 1055     Visit Number 1    Number of Visits 12    Date for PT Re-Evaluation 02/29/24    Authorization Type [employer]    PT Start Time 1058    PT Stop Time 1140    PT Time Calculation (min) 42 min          Past Medical History:  Diagnosis Date   Angio-edema    Anxiety    Arthritis    right shoulder   Asthma    Diabetes mellitus without complication (HCC)    Dyspnea    Dysrhythmia    hx of SVT   Fibroid (bleeding) (uterine)    Fibroid, uterine    H/O metrorrhagia    Hypertension    IUD (intrauterine device) in place 07/11/2015   Menorrhagia    Obesity, Class III, BMI 40-49.9 (morbid obesity) 11/11/2018   Sleep apnea    uses CPAP - setting 11   SVT (supraventricular tachycardia) (HCC)    Past Surgical History:  Procedure Laterality Date   DILATATION AND CURETTAGE/HYSTEROSCOPY WITH MINERVA N/A 12/01/2021   Procedure: DILATATION AND CURETTAGE/HYSTEROSCOPY WITH MINERVA;  Surgeon: Jayne Vonn DEL, MD;  Location: [facility];  Service: Gynecology;  Laterality: N/A;   HYSTERECTOMY ABDOMINAL WITH SALPINGECTOMY Bilateral 01/19/2023   Procedure: HYSTERECTOMY ABDOMINAL WITH SALPINGECTOMY;  Surgeon: Jayne Vonn DEL, MD;  Location: [facility];  Service: Gynecology;  Laterality: Bilateral;   HYSTEROSCOPY N/A 09/17/2020   Procedure: HYSTEROSCOPY;  Surgeon: Jayne Vonn DEL, MD;  Location: [facility];  Service: Gynecology;  Laterality: N/A;   HYSTEROSCOPY WITH D & C N/A 09/27/2013   Procedure: DILATATION AND CURETTAGE /HYSTEROSCOPY and insertion of Mirena  IUD ;  Surgeon: Marjorie DEL. Okey, MD;  Location: [facility];  Service: Gynecology;  Laterality: N/A;   IUD REMOVAL N/A 09/17/2020   Procedure: INTRAUTERINE DEVICE (IUD) REMOVAL (2);  Surgeon: Jayne Vonn DEL, MD;  Location: [facility];  Service:  Gynecology;  Laterality: N/A;   LAPAROSCOPY N/A 01/19/2023   Procedure: LAPAROSCOPY DIAGNOSTIC;  Surgeon: Jayne Vonn DEL, MD;  Location: [facility];  Service: Gynecology;  Laterality: N/A;   MYOMECTOMY  02/03/2011   Procedure: MYOMECTOMY;  Surgeon: Vonn DEL Jayne, MD;  Location: [facility];  Service: Gynecology;  Laterality: N/A;   SVT ABLATION N/A 11/08/2018   Procedure: SVT ABLATION;  Surgeon: Waddell Danelle ORN, MD;  Location: [facility];  Service: Cardiovascular;  Laterality: N/A;   SVT ABLATION N/A 12/26/2020   Procedure: SVT ABLATION;  Surgeon: Waddell Danelle ORN, MD;  Location: [facility] IN[facility]ASIVE CV LAB;  Service: Cardiovascular;  Laterality: N/A;   TONSILLECTOMY     TYMPANOSTOMY TUBE PLACEMENT     Patient Active Problem List   Diagnosis Date Noted   Acute right-sided low back pain without sciatica 12/20/2023   Muscle cramps 12/20/2023   Umbilical hernia 11/24/2023   Malaise 11/07/2023   Hypoglycemia 11/07/2023   Diarrhea 11/07/2023   Incontinence in female 11/03/2023   Acute wrist pain, left 08/05/2023   Idiopathic urticaria 07/22/2023   Seasonal allergic conjunctivitis 07/22/2023   Fibroids 01/26/2023   S/P hysterectomy 01/19/2023   Prediabetes 01/12/2023   Intractable chronic migraine with aura and without status migrainosus 12/20/2022   Iron deficiency anemia due to chronic blood loss 12/20/2022   Essential hypertension 11/01/2022  DUB (dysfunctional uterine bleeding) 11/01/2022   Moderate persistent asthma without complication 10/27/2022   Peripheral neuropathy 09/28/2022   Headache 07/01/2022   Screening for STD (sexually transmitted disease) 03/30/2022   Abdominal pain 03/02/2022   RUQ pain 02/23/2022   Abnormal abdominal CT scan 02/23/2022   Constipation 02/11/2022   Mild persistent asthma without complication 02/03/2022   Chronic urticaria 02/03/2022   Seasonal and perennial allergic rhinitis 02/03/2022   Anaphylactic shock due to adverse food reaction 02/03/2022   Need  for immunization against influenza 01/27/2022   OSA (obstructive sleep apnea) 11/23/2021   Otitis media of left ear 10/26/2021   Chronic venous insufficiency of lower extremity 10/26/2021   Allergy  10/26/2021   Encounter for IUD removal    Menorrhagia 09/04/2020   Elevated troponin    Attempted IUD removal, unsuccessful 07/05/2019   Encounter for IUD insertion 07/05/2019   Gastroesophageal reflux disease    Nonspecific chest pain 11/11/2018   Morbid obesity with body mass index (BMI) of 60.0 to 69.9 in adult (HCC) 11/11/2018   Elevated d-dimer 11/11/2018   SVT (supraventricular tachycardia) (HCC) 10/04/2018   IUD (intrauterine device) in place 07/11/2015   Other disorder of menstruation and other abnormal bleeding from female genital tract 10/30/2012    PCP: Zarwolo, Gloria, FNP  REFERRING PROVIDER: Glennon Sand, NP  REFERRING DIAG: M54.50 (ICD-10-CM) - Acute midline low back pain, unspecified whether sciatica present  Rationale for Evaluation and Treatment: Rehabilitation  THERAPY DIAG:  Other low back pain  Pain in left hip  Other abnormalities of gait and mobility  Muscle weakness (generalized)  ONSET DATE: Last month  SUBJECTIVE:                                                                                                                                                                                           SUBJECTIVE STATEMENT: Pt had noticed her back was feeling funny but then it got so bad she couldn't walk out of the blue. Pt states her back was so tender. Still getting spells when it gets like that. Pt reports it burns and hurts. 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 Yes: NPRS scale: 6.5/10 current, at worst 10/10 Pain location: L low back to side > R Pain description: burning, I couldn't move at all like a 500 lb  elephant was on my back Aggravating factors: laughing, coughing, moving, bending Relieving factors: Heating pad, biofreeze  PRECAUTIONS: None  RED FLAGS: None   WEIGHT BEARING  RESTRICTIONS: No  FALLS:  Has patient fallen in last 6 months? 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  DIAGNOSTIC FINDINGS:  Lumbar x-ray 12/30/23 IMPRESSION: Minor anterior spurring at L3-L4. No acute findings.  PATIENT SURVEYS:  Modified Oswestry Low Back Pain Disability Questionnaire: 19 / 50 = 38.0 %  COGNITION: Overall cognitive status: Within functional limits for tasks assessed     SENSATION: Will get occasional N/T with prolonged sitting along posterior hip  MUSCLE LENGTH: See ROM below  POSTURE: No Significant postural limitations  PALPATION: TTP along insertion of TSA, L glute med/max  LUMBAR ROM:   AROM eval  Flexion Right below knees with pain on L  Extension 25%  Right lateral flexion 50% pain  Left lateral flexion 80%  Right rotation 80%  Left rotation 50% pain   (Blank rows = not tested)  LOWER EXTREMITY ROM:     Active  Right eval Left eval  Hip flexion    Hip extension    Hip abduction    Hip adduction    Hip internal rotation    Hip external rotation    Knee flexion    Knee extension    Ankle dorsiflexion    Ankle plantarflexion    Ankle inversion    Ankle eversion     (Blank rows = not tested)  LOWER EXTREMITY MMT:  Performed in supine  MMT Right eval Left eval  Hip flexion 5 4+ mild pain  Hip extension    Hip abduction 4+ 4+ mild pain   Hip adduction    Hip internal rotation 5 4  Hip  external rotation 5 5  Knee flexion 5 3+  Knee extension 5 4  Ankle dorsiflexion    Ankle plantarflexion    Ankle inversion    Ankle eversion     (Blank rows = not tested)  LUMBAR SPECIAL TESTS:  Straight leg raise test: tighter on L with slight , Slump test: Negative, FABER test: slightly tighter on L, and Thomas test: slightly tighter on L  FUNCTIONAL TESTS:  Did not assess  GAIT: Distance walked: Into clinic Assistive device utilized: None Level of assistance: Complete Independence Comments: bilat trunk lean during stance time, normal reciprocal gait otherwise  TREATMENT DATE: 01/18/24 See HEP below                                                                                                                                 PATIENT EDUCATION:  Education details: Exam findings, POC, initial HEP Person educated: Patient Education method: Explanation, Demonstration, and Handouts Education comprehension: verbalized understanding, returned demonstration,  and needs further education  HOME EXERCISE PROGRAM: Access Code: [facility] URL: [email or web address] Date: 01/18/2024 Prepared by: Duan Scharnhorst April Earnie Starring  Exercises - Supine Piriformis Stretch with Towel  - 2-3 x daily - 7 x weekly - 2 sets - 30 sec hold - Supine Lower Trunk Rotation  - 2-3 x daily - 7 x weekly - 1 sets - 5 reps - 10 sec hold - Supine Diaphragmatic Breathing  - 2-3 x daily - 7 x weekly - 1 sets - 6 reps - 10 sec hold - Supine Pelvic Tilt  - 2-3 x daily - 7 x weekly - 1 sets - 10 reps  ASSESSMENT:  CLINICAL IMPRESSION: Patient is a 38 y.o. F who was seen today for physical therapy evaluation and treatment for L lumbar pain that radiates along her L flank to posterior hip. Unknown method of injury but pt reports first feeling it a little bit until out of the blue one morning she was hurting so much she couldn't walk. PMH significant for DM, obesity, and hysterectomy in 2024 but no other  injuries of note. Assessment demos tenderness to palpation along L side insertion of transverse abdominis along ribs, pelvis and thoracolumbar spine and glute med/max, limited lumbar ROM and L>R LE weakness affecting pt's ability to perform her daily activities. Pt will benefit from PT to improve on these deficits.   OBJECTIVE IMPAIRMENTS: Abnormal gait, decreased activity tolerance, decreased endurance, decreased mobility, difficulty walking, decreased ROM, decreased strength, increased fascial restrictions, increased muscle spasms, impaired flexibility, improper body mechanics, and pain.   ACTIVITY LIMITATIONS: carrying, lifting, bending, sitting, standing, squatting, sleeping, stairs, transfers, bed mobility, bathing, toileting, dressing, hygiene/grooming, and locomotion level  PARTICIPATION LIMITATIONS: meal prep, cleaning, laundry, shopping, community activity, and occupation  PERSONAL FACTORS: Age, Fitness, Past/current experiences, and Time since onset of injury/illness/exacerbation are also affecting patient's functional outcome.   REHAB POTENTIAL: Good  CLINICAL DECISION MAKING: Evolving/moderate complexity  EVALUATION COMPLEXITY: Moderate   GOALS: Goals reviewed with patient? Yes  SHORT TERM GOALS: Target date: 02/15/2024   Pt will be ind with initial HEP Baseline: Goal status: INITIAL  2.  Pt will report reduced pain by >/=50% Baseline:  Goal status: INITIAL   LONG TERM GOALS: Target date: 03/14/2024   Pt will be ind with management and progression of HEP Baseline:  Goal status: INITIAL  2.  Pt will report reduced overall pain by >/=75% Baseline:  Goal status: INITIAL  3.  Pt will have improved modified Oswestry to </=32% to demo MCID Baseline: 38% Goal status: INITIAL  4.  Pt will demo improved LE strength to 5/5 for increased standing tolerance Baseline:  Goal status: INITIAL  5.  Pt will be able to perform all lumbar ROM without pain Baseline:  Goal  status: INITIAL   PLAN:  PT FREQUENCY: 1-2x/week  PT DURATION: 8 weeks  PLANNED INTERVENTIONS: 97164- PT Re-evaluation, 97750- Physical Performance Testing, 97110-Therapeutic exercises, 97530- Therapeutic activity, 97112- Neuromuscular re-education, 97535- Self Care, 02859- Manual therapy, [phone]- Gait training, [phone]- Aquatic Therapy, [phone]- Electrical stimulation (unattended), [phone]- Traction (mechanical), [id number]- Ionotophoresis 4mg /ml Dexamethasone , 79439 (1-2 muscles), 20561 (3+ muscles)- Dry Needling, Patient/Family education, Balance training, Stair training, Taping, Joint mobilization, Spinal mobilization, Cryotherapy, and Moist heat.  PLAN FOR NEXT SESSION: Assess response to HEP. Manual therapy/stretching for transverse abdominis and hips. Progress core and hip strengthening.    Jerre Diguglielmo April Ma L Jaydin Boniface, PT 01/18/2024, 12:44 PM

## 2024-01-18 NOTE — Progress Notes (Signed)
 Specialty Pharmacy Refill Coordination Note  Jessica Sanchez is a 38 y.o. female contacted today regarding refills of specialty medication(s) Omalizumab  (Xolair )   Patient requested Courier to Provider Office   Delivery date: 01/19/24   Verified address: [facility] [address] [place] [address]   Medication will be filled on 09.17.25.   Copay:$4.00 and patient is aware

## 2024-01-19 ENCOUNTER — Encounter: Attending: Family Medicine | Admitting: Nutrition

## 2024-01-19 DIAGNOSIS — Z6841 Body Mass Index (BMI) 40.0 and over, adult: Secondary | ICD-10-CM | POA: Insufficient documentation

## 2024-01-19 DIAGNOSIS — R7303 Prediabetes: Secondary | ICD-10-CM | POA: Insufficient documentation

## 2024-01-22 ENCOUNTER — Other Ambulatory Visit: Payer: Self-pay | Admitting: Family Medicine

## 2024-01-22 DIAGNOSIS — I10 Essential (primary) hypertension: Secondary | ICD-10-CM

## 2024-01-23 ENCOUNTER — Ambulatory Visit (INDEPENDENT_AMBULATORY_CARE_PROVIDER_SITE_OTHER)

## 2024-01-23 DIAGNOSIS — L501 Idiopathic urticaria: Secondary | ICD-10-CM

## 2024-01-25 ENCOUNTER — Ambulatory Visit (INDEPENDENT_AMBULATORY_CARE_PROVIDER_SITE_OTHER): Admitting: Psychology

## 2024-01-25 DIAGNOSIS — F321 Major depressive disorder, single episode, moderate: Secondary | ICD-10-CM

## 2024-01-26 ENCOUNTER — Telehealth (INDEPENDENT_AMBULATORY_CARE_PROVIDER_SITE_OTHER): Payer: Self-pay | Admitting: Gastroenterology

## 2024-01-26 ENCOUNTER — Ambulatory Visit (INDEPENDENT_AMBULATORY_CARE_PROVIDER_SITE_OTHER): Admitting: Gastroenterology

## 2024-01-26 ENCOUNTER — Encounter (INDEPENDENT_AMBULATORY_CARE_PROVIDER_SITE_OTHER): Payer: Self-pay | Admitting: Gastroenterology

## 2024-01-26 VITALS — BP 118/79 | HR 71 | Temp 97.4°F | Ht 62.0 in | Wt 354.4 lb

## 2024-01-26 DIAGNOSIS — K921 Melena: Secondary | ICD-10-CM

## 2024-01-26 DIAGNOSIS — K219 Gastro-esophageal reflux disease without esophagitis: Secondary | ICD-10-CM | POA: Diagnosis not present

## 2024-01-26 DIAGNOSIS — R1030 Lower abdominal pain, unspecified: Secondary | ICD-10-CM

## 2024-01-26 DIAGNOSIS — R1013 Epigastric pain: Secondary | ICD-10-CM | POA: Diagnosis not present

## 2024-01-26 DIAGNOSIS — K5900 Constipation, unspecified: Secondary | ICD-10-CM

## 2024-01-26 DIAGNOSIS — D649 Anemia, unspecified: Secondary | ICD-10-CM | POA: Diagnosis not present

## 2024-01-26 MED ORDER — PANTOPRAZOLE SODIUM 40 MG PO TBEC: 40.0000 mg | DELAYED_RELEASE_TABLET | Freq: Two times a day (BID) | ORAL | 3 refills | Status: AC

## 2024-01-26 MED ORDER — LINACLOTIDE 290 MCG PO CAPS: 290.0000 ug | ORAL_CAPSULE | Freq: Every day | ORAL | Status: DC

## 2024-01-26 NOTE — Patient Instructions (Signed)
-  increase protonix  40mg  to twice daily   -continue famotidine  daily -linzess  increase to 290mcg daily -Increase water  intake, aim for atleast 64 oz per day -Increase fruits, veggies and whole grains, kiwi and prunes are especially good for constipation -good reflux precautions  -schedule EGD   Follow up 3 months  It was a pleasure to see you today. I want to create trusting relationships with patients and provide genuine, compassionate, and quality care. I truly value your feedback! please be on the lookout for a survey regarding your visit with me today. I appreciate your input about our visit and your time in completing this!    Shonya Sumida L. Zamia Tyminski, MSN, APRN, AGNP-C Adult-Gerontology Nurse Practitioner [facility] at [facility]

## 2024-01-26 NOTE — Telephone Encounter (Signed)
 Spoke with pt, scheduled her for 02/08/2024 at 9:00 am. Instructions sent to [facility].

## 2024-01-26 NOTE — Telephone Encounter (Signed)
 Medication Samples have been provided to the patient.  Drug name: Linzess     Strength: 290 mcg        Qty: 3 boxes   LOT: [id number]  Exp.Date: 08/2024  Dosing instructions: take by mouth daily  The patient has been instructed regarding the correct time, dose, and frequency of taking this medication, including desired effects and most common side effects.   Glenys Bruns 11:12 AM 01/26/2024

## 2024-01-26 NOTE — Progress Notes (Signed)
 Referring Provider: Zarwolo, Gloria, FNP Primary Care Physician:  Zarwolo, Gloria, FNP Primary GI Physician: Dr. Cindie   Chief Complaint  Patient presents with   Constipation    Pt arrives for follow up. Still having constipation and lower abdominal pain. Linzess  has not been helping. Has not had a lot of acid reflux. Miralax is not helping with constipation.    HPI:   Jessica Sanchez is a 37 y.o. female with past medical history of anxiety, arthritis, asthma, DM, uterine fibroids, HTN, sleep apnea, SVT  Patient presenting today for:  Follow up of GERD and constipation with new onset melena/anemia   Last seen in August, by DR. Carver, at that time reported taking famotidine  20mg  BID, having mild to moderate breakthrough. Some abdominal pain, primarily lower, improved with defecation. Chronic constipation with a BM twice per week, can go up to 1 week without BM at times.   Recommended continue famotidine ,add protonix  40mg  daily, given linzess  samples, consider surgical referral for abdominal hernia  Last labs in July with TSh 2.391 CMP and CBC in August with hgb 11   Present:  States constipation has not improved much. Reports chronic constipation, having 1 BM per week even on linzess  145mcg daily. She reports she has to sit for long periods on the toilet but tries not to strain. She does note water  intake is good. She still has some lower abdominal pain, wonders if this is related to her constipation. She does not note some black stools, last seen last week, she has seen these off and on over the last few weeks. Denies BRBPR.  GERD seems somewhat improved on protonix  40mg  daily, she is trying to avoid spicy foods, still having some symptoms, maybe every other day. She has some epigastric pain. Had an episode of vomiting last week. She reports appetite is somewhat decreased over the last week. She has not had any weight loss. She denies frequent NSAIDs, toradol  Rx in her chart but  she denies taking this as well.    CT abdomen pelvis with contrast 11/14/2023 small fat-containing umbilical hernia, otherwise unremarkable  Last Colonoscopy: never  Last Endoscopy: never   Filed Weights   01/26/24 1020  Weight: (!) 354 lb 6.4 oz (160.8 kg)     Past Medical History:  Diagnosis Date   Angio-edema    Anxiety    Arthritis    right shoulder   Asthma    Diabetes mellitus without complication (HCC)    Dyspnea    Dysrhythmia    hx of SVT   Fibroid (bleeding) (uterine)    Fibroid, uterine    H/O metrorrhagia    Hypertension    IUD (intrauterine device) in place 07/11/2015   Menorrhagia    Obesity, Class III, BMI 40-49.9 (morbid obesity) 11/11/2018   Sleep apnea    uses CPAP - setting 11   SVT (supraventricular tachycardia)     Past Surgical History:  Procedure Laterality Date   DILATATION AND CURETTAGE/HYSTEROSCOPY WITH MINERVA N/A 12/01/2021   Procedure: DILATATION AND CURETTAGE/HYSTEROSCOPY WITH MINERVA;  Surgeon: Jayne Vonn DEL, MD;  Location: [facility];  Service: Gynecology;  Laterality: N/A;   HYSTERECTOMY ABDOMINAL WITH SALPINGECTOMY Bilateral 01/19/2023   Procedure: HYSTERECTOMY ABDOMINAL WITH SALPINGECTOMY;  Surgeon: Jayne Vonn DEL, MD;  Location: [facility];  Service: Gynecology;  Laterality: Bilateral;   HYSTEROSCOPY N/A 09/17/2020   Procedure: HYSTEROSCOPY;  Surgeon: Jayne Vonn DEL, MD;  Location: [facility];  Service: Gynecology;  Laterality: N/A;   HYSTEROSCOPY  WITH D & C N/A 09/27/2013   Procedure: DILATATION AND CURETTAGE /HYSTEROSCOPY and insertion of Mirena  IUD ;  Surgeon: Marjorie DEL. Okey, MD;  Location: [facility];  Service: Gynecology;  Laterality: N/A;   IUD REMOVAL N/A 09/17/2020   Procedure: INTRAUTERINE DEVICE (IUD) REMOVAL (2);  Surgeon: Jayne Vonn DEL, MD;  Location: [facility];  Service: Gynecology;  Laterality: N/A;   LAPAROSCOPY N/A 01/19/2023   Procedure: LAPAROSCOPY DIAGNOSTIC;  Surgeon: Jayne Vonn DEL, MD;  Location: [facility];  Service: Gynecology;   Laterality: N/A;   MYOMECTOMY  02/03/2011   Procedure: MYOMECTOMY;  Surgeon: Vonn DEL Jayne, MD;  Location: [facility];  Service: Gynecology;  Laterality: N/A;   SVT ABLATION N/A 11/08/2018   Procedure: SVT ABLATION;  Surgeon: Waddell Danelle ORN, MD;  Location: [facility] INVASIVE CV [facility];  Service: Cardiovascular;  Laterality: N/A;   SVT ABLATION N/A 12/26/2020   Procedure: SVT ABLATION;  Surgeon: Waddell Danelle ORN, MD;  Location: [facility] INVASIVE CV LAB;  Service: Cardiovascular;  Laterality: N/A;   TONSILLECTOMY     TYMPANOSTOMY TUBE PLACEMENT      Current Outpatient Medications  Medication Sig Dispense Refill   albuterol  (VENTOLIN  HFA) 108 (90 Base) MCG/ACT inhaler Inhale 2 puffs into the lungs every 6 (six) hours as needed for shortness of breath or wheezing. 8 g 1   amlodipine -olmesartan  (AZOR ) 10-20 MG tablet TAKE ONE TABLET BY MOUTH EVERY DAY 30 tablet 1   Blood Glucose Monitoring Suppl DEVI 1 each by Does not apply route in the morning, at noon, and at bedtime. May substitute to any manufacturer covered by patient's insurance. 1 each 0   cyclobenzaprine  (FLEXERIL ) 10 MG tablet Take 1 tablet (10 mg total) by mouth 3 (three) times daily as needed for muscle spasms. 30 tablet 0   diazepam  (VALIUM ) 5 MG tablet Take 1 tablet (5 mg total) by mouth once as needed for up to 1 dose for anxiety (take 15-30 minutes before procedure). 2 tablet 0   EPINEPHrine  (EPIPEN  2-PAK) 0.3 mg/0.3 mL IJ SOAJ injection Inject 0.3 mg into the muscle as needed for anaphylaxis. 2 each 1   famotidine  (PEPCID ) 20 MG tablet Take 1 tablet (20 mg total) by mouth 2 (two) times daily. 60 tablet 5   fluticasone  (FLONASE ) 50 MCG/ACT nasal spray Place 2 sprays into both nostrils daily. 16 g 5   Fluticasone -Umeclidin-Vilant (TRELEGY ELLIPTA ) 200-62.5-25 MCG/ACT AEPB Inhale 1 puff into the lungs daily. 28 each 5   gabapentin  (NEURONTIN ) 300 MG capsule Take 1 capsule (300 mg total) by mouth at bedtime. 90 capsule 3   HYDROcodone -acetaminophen   (NORCO/VICODIN) 5-325 MG tablet Take 1 tablet by mouth every 4 (four) hours as needed. 6 tablet 0   ketorolac  (TORADOL ) 10 MG tablet Take 1 tablet (10 mg total) by mouth every 6 (six) hours as needed. 20 tablet 0   levocetirizine (XYZAL ) 5 MG tablet Take 2 tablets (10 mg total) by mouth 2 (two) times daily as needed for allergies (hives/itching). 120 tablet 5   metFORMIN  (GLUCOPHAGE ) 1000 MG tablet TAKE ONE-HALF TABLET BY MOUTH TWICE DAILY WITH A MEAL (Patient taking differently: Take 500 mg by mouth 2 (two) times daily with a meal.) 30 tablet 7   methylPREDNISolone  (MEDROL  DOSEPAK) 4 MG TBPK tablet Take as directed on packaging 21 each 0   montelukast  (SINGULAIR ) 10 MG tablet Take 1 tablet (10 mg total) by mouth at bedtime. 30 tablet 5   Multiple Vitamins-Calcium (ONE-A-DAY WOMENS PO) Take 1 tablet by mouth daily.  NON FORMULARY Pt uses a cpap nightly     omalizumab  (XOLAIR ) 300 MG/2  ML prefilled syringe Inject 300 mg into the skin every 28 (twenty-eight) days. 2 mL 11   ondansetron  (ZOFRAN -ODT) 4 MG disintegrating tablet Take 1 tablet (4 mg total) by mouth every 8 (eight) hours as needed for nausea or vomiting. 12 tablet 0   oxyCODONE  (ROXICODONE ) 5 MG immediate release tablet Take 1 tablet (5 mg total) by mouth every 6 (six) hours as needed. 4 tablet 0   pantoprazole  (PROTONIX ) 40 MG tablet Take 1 tablet (40 mg total) by mouth daily. 30 tablet 11   predniSONE  (DELTASONE ) 20 MG tablet Take 2 tablets (40 mg total) by mouth daily with breakfast. For the next four days 8 tablet 0   sucralfate  (CARAFATE ) 1 g tablet Take 1 tablet (1 g total) by mouth 4 (four) times daily -  with meals and at bedtime. 28 tablet 0   SUMAtriptan  (IMITREX ) 100 MG tablet TAKE ONE TABLET BY MOUTH EVERY 2 HOURS AS NEEDED FOR MIGRAINE. MAY REPEAT IN 2 HOURS IF HEADACHE PERSISTS OR RECURS (Patient taking differently: Take 100 mg by mouth as needed.) 10 tablet 2   tamsulosin  (FLOMAX ) 0.4 MG CAPS capsule Take 1 capsule (0.4 mg  total) by mouth daily after supper. 30 capsule 0   tirzepatide  (ZEPBOUND ) 2.5 MG/0.5ML injection vial Inject 2.5 mg into the skin once a week. 2 mL 1   triamcinolone  ointment (KENALOG ) 0.1 % Apply twice daily for flare ups below neck, maximum 10 days. 80 g 3   Vitamin D , Ergocalciferol , (DRISDOL ) 1.25 MG (50000 UNIT) CAPS capsule Take 1 capsule (50,000 Units total) by mouth every 7 (seven) days. 20 capsule 1   Current Facility-Administered Medications  Medication Dose Route Frequency Provider Last Rate Last Admin   omalizumab  (XOLAIR ) prefilled syringe 300 mg  300 mg Subcutaneous Q28 days Iva Marty Saltness, MD   300 mg at 01/23/24 1027    Allergies as of 01/26/2024 - Review Complete 01/26/2024  Allergen Reaction Noted   Shellfish allergy  Shortness Of Breath, Itching, and Swelling 06/14/2016   Sulfa  antibiotics Shortness Of Breath, Itching, and Swelling 06/14/2016   Adhesive [tape] Itching 06/09/2017    Social History   Socioeconomic History   Marital status: Single    Spouse name: Not on file   Number of children: 0   Years of education: Not on file   Highest education level: 12th grade  Occupational History   Not on file  Tobacco Use   Smoking status: Never   Smokeless tobacco: Never   Tobacco comments:    socially  Vaping Use   Vaping status: Never Used  Substance and Sexual Activity   Alcohol use: Not Currently   Drug use: Yes    Types: Marijuana   Sexual activity: Not Currently    Birth control/protection: Surgical    Comment: hyst  Other Topics Concern   Not on file  Social History Narrative   Not on file   Social Drivers of Health   Financial Resource Strain: Low Risk  (05/15/2023)   Overall Financial Resource Strain (CARDIA)    Difficulty of Paying Living Expenses: Not very hard  Food Insecurity: No Food Insecurity (05/15/2023)   Hunger Vital Sign    Worried About Running Out of Food in the Last Year: Never true    Ran Out of Food in the Last Year: Never  true  Transportation Needs: No Transportation Needs (05/15/2023)   PRAPARE - Transportation  Lack of Transportation (Medical): No    Lack of Transportation (Non-Medical): No  Physical Activity: Sufficiently Active (05/15/2023)   Exercise Vital Sign    Days of Exercise per Week: 5 days    Minutes of Exercise per Session: 30 min  Stress: Stress Concern Present (05/15/2023)   [employer] of Occupational Health - Occupational Stress Questionnaire    Feeling of Stress : Rather much  Social Connections: Moderately Isolated (05/15/2023)   Social Connection and Isolation Panel    Frequency of Communication with Friends and Family: More than three times a week    Frequency of Social Gatherings with Friends and Family: Once a week    Attends Religious Services: More than 4 times per year    Active Member of [employer] or Organizations: No    Attends Engineer, structural: Not on file    Marital Status: Never married    Review of systems General: negative for malaise, night sweats, fever, chills, weight loss Neck: Negative for lumps, goiter, pain and significant neck swelling Resp: Negative for cough, wheezing, dyspnea at rest CV: Negative for chest pain, leg swelling, palpitations, orthopnea GI: denies, hematochezia, nausea, vomiting, diarrhea, dysphagia, odyonophagia, early satiety or unintentional weight loss. +constipation +epigastric pain +melena +lower abdominal pain  MSK: Negative for joint pain or swelling, back pain, and muscle pain. Derm: Negative for itching or rash Psych: Denies depression, anxiety, memory loss, confusion. No homicidal or suicidal ideation.  Heme: Negative for prolonged bleeding, bruising easily, and swollen nodes. Endocrine: Negative for cold or heat intolerance, polyuria, polydipsia and goiter. Neuro: negative for tremor, gait imbalance, syncope and seizures. The remainder of the review of systems is noncontributory.  Physical Exam: BP 118/79   Pulse  71   Temp (!) 97.4 F (36.3 C)   Ht 5' 2 (1.575 m)   Wt (!) 354 lb 6.4 oz (160.8 kg)   LMP 12/26/2022 (Approximate)   BMI 64.82 kg/m  General:   Alert and oriented. No distress noted. Pleasant and cooperative.  Head:  Normocephalic and atraumatic. Eyes:  Conjuctiva clear without scleral icterus. Mouth:  Oral mucosa pink and moist. Good dentition. No lesions. Heart: Normal rate and rhythm, s1 and s2 heart sounds present.  Lungs: Clear lung sounds in all lobes. Respirations equal and unlabored. Abdomen:  +BS, soft, non-tender and non-distended. No rebound or guarding. No HSM or masses noted. Derm: No palmar erythema or jaundice Msk:  Symmetrical without gross deformities. Normal posture. Extremities:  Without edema. Neurologic:  Alert and  oriented x4 Psych:  Alert and cooperative. Normal mood and affect.  Invalid input(s): 6 MONTHS   ASSESSMENT: Jessica Sanchez is a 38 y.o. female presenting today for follow up of GERD, constipation and with melena/anemia/epigastric pain  GERD: not well controlled on protonix  daily, will increase to BID dosing, continue famotidine  as she is on this for history of urticaria  Constipation: minimally improved on linzess  145mcg daily. Recommend increasing to 290mcg daily, if no improvement will consider change in therapy to a different agent such as amitiza. Suspect lower abdominal pain is related to her constipation. Recent cross sectional imaging without findings to explain her pain. No rectal bleeding, no weight loss. Should continue good water  intake, diet high in fruits, veggies, whole grains. Recent labs without findings to suggest underlying, reversible cause such as hypothyroidism or electrolyte imbalance.  Anemia/melena/epigastric pain: notes some epigastric pain recently, hemoglobin mildly low in 10-11 range over the last few months with some reports of intermittent melena.  Recommend EGD for further evaluation as I cannot rule out PUD, gastritis,  duodenitis, less likely malignancy. Indications, risks and benefits of procedure discussed in detail with patient. Patient verbalized understanding and is in agreement to proceed with EGD   PLAN:  -increase protonix  40mg  to BID  -continue famotidine  daily -linzess  increase to 290mcg daily, consider alternative agent if not improving (amitiza) -Increase water  intake, aim for atleast 64 oz per day -Increase fruits, veggies and whole grains, kiwi and prunes are especially good for constipation -good reflux precautions  -schedule EGD ASA III   All questions were answered, patient verbalized understanding and is in agreement with plan as outlined above.   Follow Up: 3 months   Demitria Hay L. Reinhold Rickey, MSN, APRN, AGNP-C Adult-Gerontology Nurse Practitioner [facility]

## 2024-01-27 NOTE — Telephone Encounter (Signed)
 Spoke with patient, made her aware of her pre-op telephone call details. Patient verbalized understanding.

## 2024-01-28 ENCOUNTER — Encounter (HOSPITAL_COMMUNITY): Payer: Self-pay

## 2024-01-28 ENCOUNTER — Other Ambulatory Visit: Payer: Self-pay

## 2024-01-28 ENCOUNTER — Emergency Department (HOSPITAL_COMMUNITY)

## 2024-01-28 ENCOUNTER — Emergency Department (HOSPITAL_COMMUNITY)
Admission: EM | Admit: 2024-01-28 | Discharge: 2024-01-28 | Disposition: A | Discharge status: Home / Self Care | Attending: Emergency Medicine | Admitting: Emergency Medicine

## 2024-01-28 DIAGNOSIS — K5730 Diverticulosis of large intestine without perforation or abscess without bleeding: Secondary | ICD-10-CM | POA: Diagnosis not present

## 2024-01-28 DIAGNOSIS — Z79899 Other long term (current) drug therapy: Secondary | ICD-10-CM | POA: Diagnosis not present

## 2024-01-28 DIAGNOSIS — J45909 Unspecified asthma, uncomplicated: Secondary | ICD-10-CM | POA: Diagnosis not present

## 2024-01-28 DIAGNOSIS — Z7901 Long term (current) use of anticoagulants: Secondary | ICD-10-CM | POA: Insufficient documentation

## 2024-01-28 DIAGNOSIS — Z7984 Long term (current) use of oral hypoglycemic drugs: Secondary | ICD-10-CM | POA: Diagnosis not present

## 2024-01-28 DIAGNOSIS — I10 Essential (primary) hypertension: Secondary | ICD-10-CM | POA: Insufficient documentation

## 2024-01-28 DIAGNOSIS — R1032 Left lower quadrant pain: Secondary | ICD-10-CM | POA: Diagnosis not present

## 2024-01-28 DIAGNOSIS — K439 Ventral hernia without obstruction or gangrene: Secondary | ICD-10-CM | POA: Diagnosis not present

## 2024-01-28 DIAGNOSIS — E119 Type 2 diabetes mellitus without complications: Secondary | ICD-10-CM | POA: Insufficient documentation

## 2024-01-28 LAB — COMPREHENSIVE METABOLIC PANEL WITH GFR
ALT: 12 U/L (ref 0–44)
AST: 11 U/L — ABNORMAL LOW (ref 15–41)
Albumin: 3.5 g/dL (ref 3.5–5.0)
Alkaline Phosphatase: 62 U/L (ref 38–126)
Anion gap: 9 (ref 5–15)
BUN: 11 mg/dL (ref 6–20)
CO2: 26 mmol/L (ref 22–32)
Calcium: 8.9 mg/dL (ref 8.9–10.3)
Chloride: 104 mmol/L (ref 98–111)
Creatinine, Ser: 0.61 mg/dL (ref 0.44–1.00)
GFR, Estimated: >60 mL/min (ref >60)
Glucose, Bld: 93 mg/dL (ref 70–99)
Potassium: 4 mmol/L (ref 3.5–5.1)
Sodium: 139 mmol/L (ref 135–145)
Total Bilirubin: 0.7 mg/dL (ref 0.0–1.2)
Total Protein: 6.9 g/dL (ref 6.5–8.1)

## 2024-01-28 LAB — CBC
HCT: 34.9 % — ABNORMAL LOW (ref 36.0–46.0)
Hemoglobin: 11 g/dL — ABNORMAL LOW (ref 12.0–15.0)
MCH: 26.9 pg (ref 26.0–34.0)
MCHC: 31.5 g/dL (ref 30.0–36.0)
MCV: 85.3 fL (ref 80.0–100.0)
Platelets: 355 K/uL (ref 150–400)
RBC: 4.09 MIL/uL (ref 3.87–5.11)
RDW: 15.5 % (ref 11.5–15.5)
WBC: 5 K/uL (ref 4.0–10.5)
nRBC: 0 % (ref 0.0–0.2)

## 2024-01-28 LAB — URINALYSIS, ROUTINE W REFLEX MICROSCOPIC
Bilirubin Urine: NEGATIVE
Glucose, UA: NEGATIVE mg/dL
Hgb urine dipstick: NEGATIVE
Ketones, ur: NEGATIVE mg/dL
Leukocytes,Ua: NEGATIVE
Nitrite: NEGATIVE
Protein, ur: NEGATIVE mg/dL
Specific Gravity, Urine: 1.015 (ref 1.005–1.030)
pH: 7 (ref 5.0–8.0)

## 2024-01-28 LAB — LIPASE, BLOOD: Lipase: 25 U/L (ref 11–51)

## 2024-01-28 MED ORDER — OXYCODONE-ACETAMINOPHEN 5-325 MG PO TABS: 1.0000 | ORAL_TABLET | Freq: Four times a day (QID) | ORAL | 0 refills | Status: AC | PRN

## 2024-01-28 MED ORDER — IOHEXOL 300 MG/ML  SOLN
100.0000 mL | Freq: Once | INTRAMUSCULAR | Status: AC | PRN
  Administered 2024-01-28: 100 mL via INTRAVENOUS

## 2024-01-28 MED ORDER — OXYCODONE HCL 5 MG PO TABS
5.0000 mg | ORAL_TABLET | Freq: Once | ORAL | Status: AC
  Administered 2024-01-28: 5 mg via ORAL
  Filled 2024-01-28: qty 1

## 2024-01-28 MED ORDER — ONDANSETRON 8 MG PO TBDP
8.0000 mg | ORAL_TABLET | Freq: Once | ORAL | Status: AC
  Administered 2024-01-28: 8 mg via ORAL
  Filled 2024-01-28: qty 1

## 2024-01-28 NOTE — ED Notes (Signed)
 Pt/family received d/c paperwork at this time. After going over the paperwork any questions, comments, or concerns were answered to the best of this nurse's knowledge. The pt/family verbally acknowledged the teachings/instructions.

## 2024-01-28 NOTE — Discharge Instructions (Signed)
 Recommend bland diet for several days, avoid greasy spicy foods and fast foods.  Please call your primary care provider on Monday to arrange follow-up appointment.  Return to the emergency department if you develop any new or worsening symptoms.

## 2024-01-28 NOTE — ED Triage Notes (Addendum)
 Pt stated that she is having lower abd pain that started last night. Also complaining of emesis. Pt with fresh [employer] in triage.

## 2024-01-30 NOTE — ED Provider Notes (Signed)
 [place] EMERGENCY DEPARTMENT AT [facility] Provider Note   CSN: [id number] Arrival date & time: 01/28/24  1307     Patient presents with: Abdominal Pain   Jessica Sanchez is a 38 y.o. female.  {Add pertinent medical, surgical, social history, OB history to HPI:32947}  Abdominal Pain      Prior to Admission medications   Medication Sig Start Date End Date Taking? Authorizing Provider  oxyCODONE -acetaminophen  (PERCOCET/ROXICET) 5-325 MG tablet Take 1 tablet by mouth every 6 (six) hours as needed. 01/28/24  Yes Yanessa Hocevar, PA-C  albuterol  (VENTOLIN  HFA) 108 (90 Base) MCG/ACT inhaler Inhale 2 puffs into the lungs every 6 (six) hours as needed for shortness of breath or wheezing. 01/16/24   Tobie Arleta SQUIBB, MD  amlodipine -olmesartan  (AZOR ) 10-20 MG tablet TAKE ONE TABLET BY MOUTH EVERY DAY 01/23/24   Zarwolo, Gloria, FNP  Blood Glucose Monitoring Suppl DEVI 1 each by Does not apply route in the morning, at noon, and at bedtime. May substitute to any manufacturer covered by patient's insurance. 01/11/23   Zarwolo, Gloria, FNP  cyclobenzaprine  (FLEXERIL ) 10 MG tablet Take 1 tablet (10 mg total) by mouth 3 (three) times daily as needed for muscle spasms. 12/20/23   Boswell, Chelsa, NP  diazepam  (VALIUM ) 5 MG tablet Take 1 tablet (5 mg total) by mouth once as needed for up to 1 dose for anxiety (take 15-30 minutes before procedure). 10/17/23   Bevely Doffing, FNP  EPINEPHrine  (EPIPEN  2-PAK) 0.3 mg/0.3 mL IJ SOAJ injection Inject 0.3 mg into the muscle as needed for anaphylaxis. 01/16/24   Tobie Arleta SQUIBB, MD  famotidine  (PEPCID ) 20 MG tablet Take 1 tablet (20 mg total) by mouth 2 (two) times daily. 01/16/24   Tobie Arleta SQUIBB, MD  fluticasone  (FLONASE ) 50 MCG/ACT nasal spray Place 2 sprays into both nostrils daily. 01/16/24   Tobie Arleta SQUIBB, MD  Fluticasone -Umeclidin-Vilant (TRELEGY ELLIPTA ) 200-62.5-25 MCG/ACT AEPB Inhale 1 puff into the lungs daily. 01/16/24   Tobie Arleta SQUIBB, MD   gabapentin  (NEURONTIN ) 300 MG capsule Take 1 capsule (300 mg total) by mouth at bedtime. 06/13/23   Zarwolo, Gloria, FNP  ketorolac  (TORADOL ) 10 MG tablet Take 1 tablet (10 mg total) by mouth every 6 (six) hours as needed. 12/20/23   Boswell, Chelsa, NP  levocetirizine (XYZAL ) 5 MG tablet Take 2 tablets (10 mg total) by mouth 2 (two) times daily as needed for allergies (hives/itching). 01/16/24   Tobie Arleta SQUIBB, MD  linaclotide  (LINZESS ) 290 MCG CAPS capsule Take 1 capsule (290 mcg total) by mouth daily before breakfast. 01/26/24   Carlan, Chelsea L, NP  metFORMIN  (GLUCOPHAGE ) 1000 MG tablet TAKE ONE-HALF TABLET BY MOUTH TWICE DAILY WITH A MEAL Patient taking differently: Take 500 mg by mouth 2 (two) times daily with a meal. 11/07/23   Zarwolo, Gloria, FNP  methylPREDNISolone  (MEDROL  DOSEPAK) 4 MG TBPK tablet Take as directed on packaging 12/19/23   Yolande Lamar BROCKS, MD  montelukast  (SINGULAIR ) 10 MG tablet Take 1 tablet (10 mg total) by mouth at bedtime. 01/16/24   Tobie Arleta SQUIBB, MD  Multiple Vitamins-Calcium (ONE-A-DAY WOMENS PO) Take 1 tablet by mouth daily.    [provider]  NON FORMULARY Pt uses a cpap nightly    [provider]  omalizumab  (XOLAIR ) 300 MG/2  ML prefilled syringe Inject 300 mg into the skin every 28 (twenty-eight) days. 11/10/23   Iva Marty Saltness, MD  ondansetron  (ZOFRAN -ODT) 4 MG disintegrating tablet Take 1 tablet (4 mg total) by  mouth every 8 (eight) hours as needed for nausea or vomiting. 09/20/23   Yolande Lamar BROCKS, MD  pantoprazole  (PROTONIX ) 40 MG tablet Take 1 tablet (40 mg total) by mouth 2 (two) times daily. 01/26/24   Carlan, Chelsea L, NP  predniSONE  (DELTASONE ) 20 MG tablet Take 2 tablets (40 mg total) by mouth daily with breakfast. For the next four days 10/24/23   Garrick Lamar, MD  sucralfate  (CARAFATE ) 1 g tablet Take 1 tablet (1 g total) by mouth 4 (four) times daily -  with meals and at bedtime. 10/24/23   Garrick Lamar, MD   SUMAtriptan  (IMITREX ) 100 MG tablet TAKE ONE TABLET BY MOUTH EVERY 2 HOURS AS NEEDED FOR MIGRAINE. MAY REPEAT IN 2 HOURS IF HEADACHE PERSISTS OR RECURS Patient taking differently: Take 100 mg by mouth as needed. 07/28/23   Zarwolo, Gloria, FNP  tamsulosin  (FLOMAX ) 0.4 MG CAPS capsule Take 1 capsule (0.4 mg total) by mouth daily after supper. 11/14/23   Leath-Warren, Etta PARAS, NP  tirzepatide  (ZEPBOUND ) 2.5 MG/0.5ML injection vial Inject 2.5 mg into the skin once a week. 10/05/23   Bevely Doffing, FNP  triamcinolone  ointment (KENALOG ) 0.1 % Apply twice daily for flare ups below neck, maximum 10 days. 04/11/23   Tobie Arleta SQUIBB, MD  Vitamin D , Ergocalciferol , (DRISDOL ) 1.25 MG (50000 UNIT) CAPS capsule Take 1 capsule (50,000 Units total) by mouth every 7 (seven) days. 06/27/23   Zarwolo, Gloria, FNP  apixaban  (ELIQUIS ) 5 MG TABS tablet Take 2 tablets (10mg ) twice daily for 7 days, then 1 tablet (5mg ) twice daily 04/20/19 04/20/19  Meryle Ip A, PA-C  levonorgestrel  (MIRENA ) 20 MCG/24HR IUD 1 each by Intrauterine route once.   06/25/20  [provider]    Allergies: Shellfish allergy , Sulfa  antibiotics, and Adhesive [tape]    Review of Systems  Gastrointestinal:  Positive for abdominal pain.    Updated Vital Signs BP 131/77   Pulse 80   Temp 98.8 F (37.1 C) (Oral)   Resp 18   Ht 5' 2 (1.575 m)   Wt (!) 160 kg   LMP 12/26/2022 (Approximate)   SpO2 98%   BMI 64.52 kg/m   Physical Exam  (all labs ordered are listed, but only abnormal results are displayed) Labs Reviewed  COMPREHENSIVE METABOLIC PANEL WITH GFR - Abnormal; Notable for the following components:      Result Value   AST 11 (*)    All other components within normal limits  CBC - Abnormal; Notable for the following components:   Hemoglobin 11.0 (*)    HCT 34.9 (*)    All other components within normal limits  LIPASE, BLOOD  URINALYSIS, ROUTINE W REFLEX MICROSCOPIC    EKG: None  Radiology: CT ABDOMEN PELVIS  W CONTRAST Result Date: 01/28/2024 CLINICAL DATA:  Left lower abdominal pain, emesis EXAM: CT ABDOMEN AND PELVIS WITH CONTRAST TECHNIQUE: Multidetector CT imaging of the abdomen and pelvis was performed using the standard protocol following bolus administration of intravenous contrast. RADIATION DOSE REDUCTION: This exam was performed according to the departmental dose-optimization program which includes automated exposure control, adjustment of the mA and/or kV according to patient size and/or use of iterative reconstruction technique. CONTRAST:  OMNIPAQUE  IOHEXOL  300 MG/ML  SOLN COMPARISON:  12/19/2023 FINDINGS: Lower chest: No acute pleural or parenchymal lung disease. Hepatobiliary: No focal liver abnormality is seen. Gallbladder is decompressed. No gallstones, gallbladder wall thickening, or biliary dilatation. Pancreas: Unremarkable. No pancreatic ductal dilatation or surrounding inflammatory changes. Spleen: Normal in size  without focal abnormality. Adrenals/Urinary Tract: Adrenal glands are unremarkable. Kidneys are normal, without renal calculi, focal lesion, or hydronephrosis. Bladder is unremarkable. Stomach/Bowel: No bowel obstruction or ileus. Normal appendix right lower quadrant. Scattered colonic diverticulosis without diverticulitis. No bowel wall thickening or inflammatory change. Vascular/Lymphatic: No significant vascular findings are present. No enlarged abdominal or pelvic lymph nodes. Reproductive: Status post hysterectomy. No adnexal masses. Other: No free fluid or free intraperitoneal gas. Fat containing supraumbilical midline ventral hernia unchanged. No bowel herniation. Musculoskeletal: No acute or destructive bony abnormalities. Reconstructed images demonstrate no additional findings. IMPRESSION: 1. No acute intra-abdominal or intrapelvic process. 2. Stable fat containing supraumbilical midline ventral hernia. Electronically Signed   By: Ozell Daring M.D.   On: 01/28/2024 16:05     {Document cardiac monitor, telemetry assessment procedure when appropriate:32947} Procedures   Medications Ordered in the ED  iohexol  (OMNIPAQUE ) 300 MG/ML solution 100 mL (100 mLs Intravenous Contrast Given 01/28/24 1535)  oxyCODONE  (Oxy IR/ROXICODONE ) immediate release tablet 5 mg (5 mg Oral Given 01/28/24 1631)  ondansetron  (ZOFRAN -ODT) disintegrating tablet 8 mg (8 mg Oral Given 01/28/24 1631)      {Click here for ABCD2, HEART and other calculators REFRESH Note before signing:1}                              Medical Decision Making Amount and/or Complexity of Data Reviewed Labs: ordered. Radiology: ordered.  Risk Prescription drug management.   ***  {Document critical care time when appropriate  Document review of labs and clinical decision tools ie CHADS2VASC2, etc  Document your independent review of radiology images and any outside records  Document your discussion with family members, caretakers and with consultants  Document social determinants of health affecting pt's care  Document your decision making why or why not admission, treatments were needed:32947:::1}   Final diagnoses:  Left lower quadrant abdominal pain    ED Discharge Orders          Ordered    oxyCODONE -acetaminophen  (PERCOCET/ROXICET) 5-325 MG tablet  Every 6 hours PRN        01/28/24 1704

## 2024-02-01 ENCOUNTER — Ambulatory Visit: Admitting: Physical Therapy

## 2024-02-02 ENCOUNTER — Other Ambulatory Visit: Payer: Self-pay

## 2024-02-02 ENCOUNTER — Encounter: Payer: Self-pay | Admitting: Emergency Medicine

## 2024-02-02 ENCOUNTER — Ambulatory Visit
Admission: EM | Admit: 2024-02-02 | Discharge: 2024-02-02 | Disposition: A | Discharge status: Home / Self Care | Attending: Nurse Practitioner | Admitting: Nurse Practitioner

## 2024-02-02 ENCOUNTER — Encounter (HOSPITAL_COMMUNITY)
Admission: RE | Admit: 2024-02-02 | Discharge: 2024-02-02 | Disposition: A | Discharge status: Home / Self Care | Source: Ambulatory Visit | Attending: Internal Medicine | Admitting: Internal Medicine

## 2024-02-02 DIAGNOSIS — B349 Viral infection, unspecified: Secondary | ICD-10-CM | POA: Diagnosis not present

## 2024-02-02 LAB — POC COVID19/FLU A&B COMBO
Covid Antigen, POC: NEGATIVE
Influenza A Antigen, POC: NEGATIVE
Influenza B Antigen, POC: NEGATIVE

## 2024-02-02 NOTE — ED Provider Notes (Signed)
 [facility]    CSN: [id number] Arrival date & time: 02/02/24  0802      History   Chief Complaint Chief Complaint  Patient presents with   Generalized Body Aches    HPI Jessica Sanchez is a 38 y.o. female.   The history is provided by the patient.   Patient presents with a 1 day history of generalized body aches, chills, cough, nasal congestion, and runny nose.  Patient denies fever, headache, ear pain, chest pain, abdominal pain, nausea, vomiting, diarrhea, or rash.  Patient reports several people at her job have been sick.  So far, she has been using over-the-counter medications and use of her inhaler for her symptoms.  Patient with underlying history of asthma.  Past Medical History:  Diagnosis Date   Angio-edema    Anxiety    Arthritis    right shoulder   Asthma    Diabetes mellitus without complication (HCC)    Dyspnea    Dysrhythmia    hx of SVT   Fibroid (bleeding) (uterine)    Fibroid, uterine    H/O metrorrhagia    Hypertension    IUD (intrauterine device) in place 07/11/2015   Menorrhagia    Obesity, Class III, BMI 40-49.9 (morbid obesity) (HCC) 11/11/2018   Sleep apnea    uses CPAP - setting 11   SVT (supraventricular tachycardia)     Patient Active Problem List   Diagnosis Date Noted   Melena 01/26/2024   Anemia 01/26/2024   Acute right-sided low back pain without sciatica 12/20/2023   Muscle cramps 12/20/2023   Umbilical hernia 11/24/2023   Malaise 11/07/2023   Hypoglycemia 11/07/2023   Diarrhea 11/07/2023   Incontinence in female 11/03/2023   Acute wrist pain, left 08/05/2023   Idiopathic urticaria 07/22/2023   Seasonal allergic conjunctivitis 07/22/2023   Fibroids 01/26/2023   S/P hysterectomy 01/19/2023   Prediabetes 01/12/2023   Intractable chronic migraine with aura and without status migrainosus 12/20/2022   Iron deficiency anemia due to chronic blood loss 12/20/2022   Essential hypertension 11/01/2022   DUB (dysfunctional  uterine bleeding) 11/01/2022   Moderate persistent asthma without complication 10/27/2022   Peripheral neuropathy 09/28/2022   Headache 07/01/2022   Screening for STD (sexually transmitted disease) 03/30/2022   Abdominal pain 03/02/2022   RUQ pain 02/23/2022   Abnormal abdominal CT scan 02/23/2022   Constipation 02/11/2022   Mild persistent asthma without complication 02/03/2022   Chronic urticaria 02/03/2022   Seasonal and perennial allergic rhinitis 02/03/2022   Anaphylactic shock due to adverse food reaction 02/03/2022   Need for immunization against influenza 01/27/2022   OSA (obstructive sleep apnea) 11/23/2021   Otitis media of left ear 10/26/2021   Chronic venous insufficiency of lower extremity 10/26/2021   Allergy  10/26/2021   Encounter for IUD removal    Menorrhagia 09/04/2020   Elevated troponin    Attempted IUD removal, unsuccessful 07/05/2019   Encounter for IUD insertion 07/05/2019   Gastroesophageal reflux disease    Nonspecific chest pain 11/11/2018   Morbid obesity with body mass index (BMI) of 60.0 to 69.9 in adult [facility]) 11/11/2018   Elevated d-dimer 11/11/2018   SVT (supraventricular tachycardia) 10/04/2018   IUD (intrauterine device) in place 07/11/2015   Other disorder of menstruation and other abnormal bleeding from female genital tract 10/30/2012    Past Surgical History:  Procedure Laterality Date   DILATATION AND CURETTAGE/HYSTEROSCOPY WITH MINERVA N/A 12/01/2021   Procedure: DILATATION AND CURETTAGE/HYSTEROSCOPY WITH MINERVA;  Surgeon: Jayne Minder  H, MD;  Location: [facility];  Service: Gynecology;  Laterality: N/A;   HYSTERECTOMY ABDOMINAL WITH SALPINGECTOMY Bilateral 01/19/2023   Procedure: HYSTERECTOMY ABDOMINAL WITH SALPINGECTOMY;  Surgeon: Jayne Vonn DEL, MD;  Location: [facility];  Service: Gynecology;  Laterality: Bilateral;   HYSTEROSCOPY N/A 09/17/2020   Procedure: HYSTEROSCOPY;  Surgeon: Jayne Vonn DEL, MD;  Location: [facility];  Service: Gynecology;   Laterality: N/A;   HYSTEROSCOPY WITH D & C N/A 09/27/2013   Procedure: DILATATION AND CURETTAGE /HYSTEROSCOPY and insertion of Mirena  IUD ;  Surgeon: Marjorie DEL. Okey, MD;  Location: [facility];  Service: Gynecology;  Laterality: N/A;   IUD REMOVAL N/A 09/17/2020   Procedure: INTRAUTERINE DEVICE (IUD) REMOVAL (2);  Surgeon: Jayne Vonn DEL, MD;  Location: [facility];  Service: Gynecology;  Laterality: N/A;   LAPAROSCOPY N/A 01/19/2023   Procedure: LAPAROSCOPY DIAGNOSTIC;  Surgeon: Jayne Vonn DEL, MD;  Location: [facility];  Service: Gynecology;  Laterality: N/A;   MYOMECTOMY  02/03/2011   Procedure: MYOMECTOMY;  Surgeon: Vonn DEL Jayne, MD;  Location: [facility];  Service: Gynecology;  Laterality: N/A;   SVT ABLATION N/A 11/08/2018   Procedure: SVT ABLATION;  Surgeon: Waddell Danelle ORN, MD;  Location: [facility] IN[facility]AS[facility] CV [facility];  Service: Cardiovascular;  Laterality: N/A;   SVT ABLATION N/A 12/26/2020   Procedure: SVT ABLATION;  Surgeon: Waddell Danelle ORN, MD;  Location: [facility] INVASIVE CV LAB;  Service: Cardiovascular;  Laterality: N/A;   TONSILLECTOMY     TYMPANOSTOMY TUBE PLACEMENT      OB History     Gravida  1   Para      Term      Preterm      AB  1   Living         SAB  1   IAB      Ectopic      Multiple      Live Births               Home Medications    Prior to Admission medications   Medication Sig Start Date End Date Taking? Authorizing Provider  albuterol  (VENTOLIN  HFA) 108 (90 Base) MCG/ACT inhaler Inhale 2 puffs into the lungs every 6 (six) hours as needed for shortness of breath or wheezing. 01/16/24   Tobie Arleta SQUIBB, MD  amlodipine -olmesartan  (AZOR ) 10-20 MG tablet TAKE ONE TABLET BY MOUTH EVERY DAY 01/23/24   Bacchus, Gloria Z, FNP  Blood Glucose Monitoring Suppl DEVI 1 each by Does not apply route in the morning, at noon, and at bedtime. May substitute to any manufacturer covered by patient's insurance. 01/11/23   Bacchus, Meade PEDLAR, FNP  cyclobenzaprine  (FLEXERIL ) 10 MG tablet Take  1 tablet (10 mg total) by mouth 3 (three) times daily as needed for muscle spasms. 12/20/23   Boswell, Chelsa, NP  diazepam  (VALIUM ) 5 MG tablet Take 1 tablet (5 mg total) by mouth once as needed for up to 1 dose for anxiety (take 15-30 minutes before procedure). 10/17/23   Bevely Doffing, FNP  EPINEPHrine  (EPIPEN  2-PAK) 0.3 mg/0.3 mL IJ SOAJ injection Inject 0.3 mg into the muscle as needed for anaphylaxis. 01/16/24   Tobie Arleta SQUIBB, MD  famotidine  (PEPCID ) 20 MG tablet Take 1 tablet (20 mg total) by mouth 2 (two) times daily. 01/16/24   Tobie Arleta SQUIBB, MD  fluticasone  (FLONASE ) 50 MCG/ACT nasal spray Place 2 sprays into both nostrils daily. 01/16/24   Tobie Arleta SQUIBB, MD  Fluticasone -Umeclidin-Vilant (TRELEGY ELLIPTA ) 200-62.5-25 MCG/ACT AEPB Inhale  1 puff into the lungs daily. 01/16/24   Tobie Arleta SQUIBB, MD  gabapentin  (NEURONTIN ) 300 MG capsule Take 1 capsule (300 mg total) by mouth at bedtime. 06/13/23   Bacchus, Gloria Z, FNP  ketorolac  (TORADOL ) 10 MG tablet Take 1 tablet (10 mg total) by mouth every 6 (six) hours as needed. 12/20/23   Boswell, Chelsa, NP  levocetirizine (XYZAL ) 5 MG tablet Take 2 tablets (10 mg total) by mouth 2 (two) times daily as needed for allergies (hives/itching). 01/16/24   Tobie Arleta SQUIBB, MD  linaclotide  (LINZESS ) 290 MCG CAPS capsule Take 1 capsule (290 mcg total) by mouth daily before breakfast. 01/26/24   Carlan, Chelsea L, NP  metFORMIN  (GLUCOPHAGE ) 1000 MG tablet TAKE ONE-HALF TABLET BY MOUTH TWICE DAILY WITH A MEAL Patient taking differently: Take 500 mg by mouth 2 (two) times daily with a meal. 11/07/23   Bacchus, Meade PEDLAR, FNP  methylPREDNISolone  (MEDROL  DOSEPAK) 4 MG TBPK tablet Take as directed on packaging 12/19/23   Yolande Lamar BROCKS, MD  montelukast  (SINGULAIR ) 10 MG tablet Take 1 tablet (10 mg total) by mouth at bedtime. 01/16/24   Tobie Arleta SQUIBB, MD  Multiple Vitamins-Calcium (ONE-A-DAY WOMENS PO) Take 1 tablet by mouth daily.    [provider]  NON FORMULARY  Pt uses a cpap nightly    [provider]  omalizumab  (XOLAIR ) 300 MG/2  ML prefilled syringe Inject 300 mg into the skin every 28 (twenty-eight) days. 11/10/23   Iva Marty Saltness, MD  ondansetron  (ZOFRAN -ODT) 4 MG disintegrating tablet Take 1 tablet (4 mg total) by mouth every 8 (eight) hours as needed for nausea or vomiting. 09/20/23   Yolande Lamar BROCKS, MD  oxyCODONE -acetaminophen  (PERCOCET/ROXICET) 5-325 MG tablet Take 1 tablet by mouth every 6 (six) hours as needed. 01/28/24   Triplett, Tammy, PA-C  pantoprazole  (PROTONIX ) 40 MG tablet Take 1 tablet (40 mg total) by mouth 2 (two) times daily. 01/26/24   Carlan, Mitzie CROME, NP  predniSONE  (DELTASONE ) 20 MG tablet Take 2 tablets (40 mg total) by mouth daily with breakfast. For the next four days 10/24/23   Garrick Lamar, MD  sucralfate  (CARAFATE ) 1 g tablet Take 1 tablet (1 g total) by mouth 4 (four) times daily -  with meals and at bedtime. 10/24/23   Garrick Lamar, MD  SUMAtriptan  (IMITREX ) 100 MG tablet TAKE ONE TABLET BY MOUTH EVERY 2 HOURS AS NEEDED FOR MIGRAINE. MAY REPEAT IN 2 HOURS IF HEADACHE PERSISTS OR RECURS Patient taking differently: Take 100 mg by mouth as needed. 07/28/23   Bacchus, Meade PEDLAR, FNP  tamsulosin  (FLOMAX ) 0.4 MG CAPS capsule Take 1 capsule (0.4 mg total) by mouth daily after supper. 11/14/23   Leath-Warren, Etta PARAS, NP  tirzepatide  (ZEPBOUND ) 2.5 MG/0.5ML injection vial Inject 2.5 mg into the skin once a week. 10/05/23   Bevely Doffing, FNP  triamcinolone  ointment (KENALOG ) 0.1 % Apply twice daily for flare ups below neck, maximum 10 days. 04/11/23   Tobie Arleta SQUIBB, MD  Vitamin D , Ergocalciferol , (DRISDOL ) 1.25 MG (50000 UNIT) CAPS capsule Take 1 capsule (50,000 Units total) by mouth every 7 (seven) days. 06/27/23   Bacchus, Gloria Z, FNP  apixaban  (ELIQUIS ) 5 MG TABS tablet Take 2 tablets (10mg ) twice daily for 7 days, then 1 tablet (5mg ) twice daily 04/20/19 04/20/19  Meryle Ip A, PA-C  levonorgestrel   (MIRENA ) 20 MCG/24HR IUD 1 each by Intrauterine route once.   06/25/20  [provider]    Family History Family History  Problem  Relation Age of Onset   Cirrhosis Mother    Diabetes Mother    Cancer Maternal Grandfather    Hypertension Sister    Diabetes Sister    Anesthesia problems Neg Hx    Hypotension Neg Hx    Malignant hyperthermia Neg Hx    Pseudochol deficiency Neg Hx     Social History Social History   Tobacco Use   Smoking status: Never   Smokeless tobacco: Never   Tobacco comments:    socially  Vaping Use   Vaping status: Never Used  Substance Use Topics   Alcohol use: Not Currently   Drug use: Yes    Types: Marijuana     Allergies   Shellfish allergy , Sulfa  antibiotics, and Adhesive [tape]   Review of Systems Review of Systems Per HPI  Physical Exam Triage Vital Signs ED Triage Vitals [02/02/24 0816]  Encounter Vitals Group     BP 123/82     Girls Systolic BP Percentile      Girls Diastolic BP Percentile      Boys Systolic BP Percentile      Boys Diastolic BP Percentile      Pulse Rate 78     Resp 20     Temp 98.4 F (36.9 C)     Temp Source Oral     SpO2 96 %     Weight      Height      Head Circumference      Peak Flow      Pain Score 10     Pain Loc      Pain Education      Exclude from Growth Chart    No data found.  Updated Vital Signs BP 123/82 (BP Location: Left Arm)   Pulse 78   Temp 98.4 F (36.9 C) (Oral)   Resp 20   LMP 12/26/2022 (Approximate)   SpO2 96%   Visual Acuity Right Eye Distance:   Left Eye Distance:   Bilateral Distance:    Right Eye Near:   Left Eye Near:    Bilateral Near:     Physical Exam Constitutional:      General: She is not in acute distress.    Appearance: Normal appearance. She is well-developed.  HENT:     Head: Normocephalic and atraumatic.     Right Ear: Tympanic membrane, ear canal and external ear normal.     Left Ear: Tympanic membrane, ear canal and external ear  normal.     Nose: Congestion present.     Right Turbinates: Enlarged and swollen.     Left Turbinates: Enlarged and swollen.     Right Sinus: No maxillary sinus tenderness or frontal sinus tenderness.     Left Sinus: No maxillary sinus tenderness or frontal sinus tenderness.     Mouth/Throat:     Lips: Pink.     Mouth: Mucous membranes are moist.     Pharynx: Uvula midline. Postnasal drip present. No pharyngeal swelling, oropharyngeal exudate, posterior oropharyngeal erythema or uvula swelling.     Comments: Cobblestoning present to posterior oropharynx  Eyes:     Extraocular Movements: Extraocular movements intact.     Conjunctiva/sclera: Conjunctivae normal.     Pupils: Pupils are equal, round, and reactive to light.  Neck:     Thyroid : No thyromegaly.     Trachea: No tracheal deviation.  Cardiovascular:     Rate and Rhythm: Normal rate and regular rhythm.     Pulses: Normal pulses.  Heart sounds: Normal heart sounds.  Pulmonary:     Effort: Pulmonary effort is normal. No respiratory distress.     Breath sounds: Normal breath sounds. No stridor. No wheezing, rhonchi or rales.  Abdominal:     General: Bowel sounds are normal.     Palpations: Abdomen is soft.     Tenderness: There is no abdominal tenderness.  Musculoskeletal:     Cervical back: Normal range of motion and neck supple.  Skin:    General: Skin is warm and dry.  Neurological:     General: No focal deficit present.     Mental Status: She is alert and oriented to person, place, and time.  Psychiatric:        Mood and Affect: Mood normal.        Behavior: Behavior normal.        Thought Content: Thought content normal.        Judgment: Judgment normal.      UC Treatments / Results  Labs (all labs ordered are listed, but only abnormal results are displayed) Labs Reviewed  POC COVID19/FLU A&B COMBO    EKG   Radiology No results found.  Procedures Procedures (including critical care  time)  Medications Ordered in UC Medications - No data to display  Initial Impression / Assessment and Plan / UC Course  I have reviewed the triage vital signs and the nursing notes.  Pertinent labs & imaging results that were available during my care of the patient were reviewed by me and considered in my medical decision making (see chart for details).  COVID/flu test was negative.  On exam, the patient's lung sounds are clear throughout, room air sats at 96%.  She is well-appearing, is in no acute distress, and vital signs are stable.  Symptoms consistent with viral etiology.  Symptomatic treatment provided with Promethazine  DM for the cough and fluticasone  50 mcg nasal spray for nasal congestion and runny nose.  Supportive care recommendations were provided and discussed with the patient to include fluids, rest, over-the-counter analgesics, use of normal saline nasal spray, and use of a humidifier during sleep.  Patient was given indications regarding follow-up.  Patient was in agreement with this plan of care and verbalizes understanding.  All questions were answered.  Patient stable for discharge.  Work note was provided.   Final Clinical Impressions(s) / UC Diagnoses   Final diagnoses:  Viral illness     Discharge Instructions      The COVID/flu test was negative. Take medication as prescribed.  Continue use of your albuterol  inhaler as needed for wheezing or shortness of breath. Increase fluids and allow for plenty of rest. You may take over-the-counter Tylenol  as needed for pain, fever, or general discomfort. Recommend the use of normal saline nasal spray throughout the day for nasal congestion and runny nose. For your cough, you may find it helpful to use a humidifier in your bedroom at nighttime during sleep and to sleep elevated on pillows while symptoms persist. As discussed, symptoms should begin to improve within the next 5 to 7 days.  If symptoms fail to improve, or begin  to worsen, you may follow-up in this clinic or with your primary care physician for further evaluation. Follow-up as needed.     ED Prescriptions   None    PDMP not reviewed this encounter.   Gilmer Etta PARAS, NP 02/02/24 [phone]

## 2024-02-02 NOTE — ED Triage Notes (Signed)
 Pt reports generalized body aches, chills, cough since yesterday. Pt denies any change in symptoms with otc medication. NAD noted.

## 2024-02-02 NOTE — Discharge Instructions (Signed)
 The COVID/flu test was negative. Take medication as prescribed.  Continue use of your albuterol  inhaler as needed for wheezing or shortness of breath. Increase fluids and allow for plenty of rest. You may take over-the-counter Tylenol  as needed for pain, fever, or general discomfort. Recommend the use of normal saline nasal spray throughout the day for nasal congestion and runny nose. For your cough, you may find it helpful to use a humidifier in your bedroom at nighttime during sleep and to sleep elevated on pillows while symptoms persist. As discussed, symptoms should begin to improve within the next 5 to 7 days.  If symptoms fail to improve, or begin to worsen, you may follow-up in this clinic or with your primary care physician for further evaluation. Follow-up as needed.

## 2024-02-04 NOTE — Progress Notes (Signed)
 DAP note              Client name: Jessica Sanchez Therapist name: Jessica Sanchez Date: 12/15/2023 Time: 60 mins   Data: Client reports that she is okay. She states that [employer] has cut her hours back. She states that she speculates that it is because she has had to go to the ER several times but she says that she has provided the notes. She reports that she is worried about money and that she needs to look for another job. Client reports depressive symptoms are at a 7 and would like to get that to a 2 with the use of interventions.   Assessment: The client presented very overwhelmed. She is still just as concerned about her health as she does not understand what is happening. She is being referred to specialists at this time and so hopefully she will get some answers. In the meantime, she needs to get her physical health to a better place so she can work. If she does not work she can not pay her bills and this will cause more stress on her mental health.       Plan: The client will attend bi-weekly sessions with a goal of attending at least 2 per month. Client will take a moment of mindfulness daily and utilize box breathing as needed to reduce symptoms. Work on healing past trauma by figuring out triggers, to help with symptom reduction, emotional regulation, and processing.

## 2024-02-07 ENCOUNTER — Telehealth: Payer: Self-pay | Admitting: *Deleted

## 2024-02-07 NOTE — Telephone Encounter (Signed)
 Spoke with pt. Her procedure for 10/8 has been rescheduled to 10/27 due to Dr. Cindie out sick. Aware will send new instructions to mychart.

## 2024-02-08 ENCOUNTER — Other Ambulatory Visit: Payer: Self-pay

## 2024-02-10 ENCOUNTER — Other Ambulatory Visit: Payer: Self-pay

## 2024-02-13 ENCOUNTER — Other Ambulatory Visit: Payer: Self-pay

## 2024-02-14 ENCOUNTER — Other Ambulatory Visit (HOSPITAL_COMMUNITY): Payer: Self-pay

## 2024-02-14 NOTE — Telephone Encounter (Signed)
 Patient called to reschedule. I rescheduled her to 03/05/2024 at 8:30am with Dr. Cindie. Sending updated instructions to mychart and message sent to endo.

## 2024-02-18 ENCOUNTER — Emergency Department (HOSPITAL_COMMUNITY)

## 2024-02-18 ENCOUNTER — Other Ambulatory Visit: Payer: Self-pay

## 2024-02-18 ENCOUNTER — Encounter (HOSPITAL_COMMUNITY): Payer: Self-pay

## 2024-02-18 ENCOUNTER — Emergency Department (HOSPITAL_COMMUNITY)
Admission: EM | Admit: 2024-02-18 | Discharge: 2024-02-19 | Disposition: A | Discharge status: Home / Self Care | Attending: Emergency Medicine | Admitting: Emergency Medicine

## 2024-02-18 DIAGNOSIS — R0602 Shortness of breath: Secondary | ICD-10-CM | POA: Diagnosis not present

## 2024-02-18 DIAGNOSIS — R0789 Other chest pain: Secondary | ICD-10-CM | POA: Diagnosis not present

## 2024-02-18 DIAGNOSIS — Z7901 Long term (current) use of anticoagulants: Secondary | ICD-10-CM | POA: Diagnosis not present

## 2024-02-18 DIAGNOSIS — R079 Chest pain, unspecified: Secondary | ICD-10-CM | POA: Diagnosis not present

## 2024-02-18 DIAGNOSIS — R42 Dizziness and giddiness: Secondary | ICD-10-CM | POA: Diagnosis not present

## 2024-02-18 LAB — BASIC METABOLIC PANEL WITH GFR
Anion gap: 9 (ref 5–15)
BUN: 12 mg/dL (ref 6–20)
CO2: 25 mmol/L (ref 22–32)
Calcium: 8.8 mg/dL — ABNORMAL LOW (ref 8.9–10.3)
Chloride: 104 mmol/L (ref 98–111)
Creatinine, Ser: 0.79 mg/dL (ref 0.44–1.00)
GFR, Estimated: >60 mL/min (ref >60)
Glucose, Bld: 86 mg/dL (ref 70–99)
Potassium: 4.2 mmol/L (ref 3.5–5.1)
Sodium: 138 mmol/L (ref 135–145)

## 2024-02-18 LAB — CBC
HCT: 34.5 % — ABNORMAL LOW (ref 36.0–46.0)
Hemoglobin: 10.8 g/dL — ABNORMAL LOW (ref 12.0–15.0)
MCH: 26.7 pg (ref 26.0–34.0)
MCHC: 31.3 g/dL (ref 30.0–36.0)
MCV: 85.2 fL (ref 80.0–100.0)
Platelets: 293 K/uL (ref 150–400)
RBC: 4.05 MIL/uL (ref 3.87–5.11)
RDW: 15.5 % (ref 11.5–15.5)
WBC: 7.3 K/uL (ref 4.0–10.5)
nRBC: 0 % (ref 0.0–0.2)

## 2024-02-18 LAB — TROPONIN T, HIGH SENSITIVITY: Troponin T High Sensitivity: <15 ng/L (ref 0–19)

## 2024-02-18 MED ORDER — KETOROLAC TROMETHAMINE 30 MG/ML IJ SOLN
30.0000 mg | Freq: Once | INTRAMUSCULAR | Status: AC
  Administered 2024-02-18: 30 mg via INTRAVENOUS
  Filled 2024-02-18: qty 1

## 2024-02-18 NOTE — ED Triage Notes (Signed)
 Pt presents with sudden onset of CP, ShOB, and lightheadedness while lying in the bed approx 30 min PTA. With pain radiating to her R neck and shoulder. She initially checked her HR on her phone and saw it was 160, but then returned to normal. The symptoms have been constant and have not improved.

## 2024-02-18 NOTE — ED Provider Notes (Signed)
 [place] [facility] AT [facility] Provider Note   CSN: [id number] Arrival date & time: 02/18/24  2226     Patient presents with: Chest Pain   Jessica Sanchez is a 38 y.o. female.  {Add pertinent medical, surgical, social history, OB history to HPI:32947} Patient is a 38 year old female with past medical history of SVT, anemia, dysfunctional uterine bleeding.  Patient presenting today for evaluation of chest discomfort.  She was laying in bed this evening when she developed the sudden onset of tightness to her right upper chest radiating into her right shoulder and jaw.  She describes this as feeling as though there was a weight on her chest.  She reports her heart rate increasing to 160, then returning to normal.  She reports feeling somewhat dizzy and short of breath with the symptoms, but denies any nausea or diaphoresis.       Prior to Admission medications   Medication Sig Start Date End Date Taking? Authorizing Provider  albuterol  (VENTOLIN  HFA) 108 (90 Base) MCG/ACT inhaler Inhale 2 puffs into the lungs every 6 (six) hours as needed for shortness of breath or wheezing. 01/16/24   Tobie Arleta SQUIBB, MD  amlodipine -olmesartan  (AZOR ) 10-20 MG tablet TAKE ONE TABLET BY MOUTH EVERY DAY 01/23/24   Bacchus, Gloria Z, FNP  Blood Glucose Monitoring Suppl DEVI 1 each by Does not apply route in the morning, at noon, and at bedtime. May substitute to any manufacturer covered by patient's insurance. 01/11/23   Bacchus, Meade PEDLAR, FNP  cyclobenzaprine  (FLEXERIL ) 10 MG tablet Take 1 tablet (10 mg total) by mouth 3 (three) times daily as needed for muscle spasms. 12/20/23   Boswell, Chelsa, NP  diazepam  (VALIUM ) 5 MG tablet Take 1 tablet (5 mg total) by mouth once as needed for up to 1 dose for anxiety (take 15-30 minutes before procedure). 10/17/23   Bevely Doffing, FNP  EPINEPHrine  (EPIPEN  2-PAK) 0.3 mg/0.3 mL IJ SOAJ injection Inject 0.3 mg into the muscle as needed for anaphylaxis.  01/16/24   Tobie Arleta SQUIBB, MD  famotidine  (PEPCID ) 20 MG tablet Take 1 tablet (20 mg total) by mouth 2 (two) times daily. 01/16/24   Tobie Arleta SQUIBB, MD  fluticasone  (FLONASE ) 50 MCG/ACT nasal spray Place 2 sprays into both nostrils daily. 01/16/24   Tobie Arleta SQUIBB, MD  Fluticasone -Umeclidin-Vilant (TRELEGY ELLIPTA ) 200-62.5-25 MCG/ACT AEPB Inhale 1 puff into the lungs daily. 01/16/24   Tobie Arleta SQUIBB, MD  gabapentin  (NEURONTIN ) 300 MG capsule Take 1 capsule (300 mg total) by mouth at bedtime. 06/13/23   Bacchus, Gloria Z, FNP  ketorolac  (TORADOL ) 10 MG tablet Take 1 tablet (10 mg total) by mouth every 6 (six) hours as needed. 12/20/23   Boswell, Chelsa, NP  levocetirizine (XYZAL ) 5 MG tablet Take 2 tablets (10 mg total) by mouth 2 (two) times daily as needed for allergies (hives/itching). 01/16/24   Tobie Arleta SQUIBB, MD  linaclotide  (LINZESS ) 290 MCG CAPS capsule Take 1 capsule (290 mcg total) by mouth daily before breakfast. 01/26/24   Carlan, Chelsea L, NP  metFORMIN  (GLUCOPHAGE ) 1000 MG tablet TAKE ONE-HALF TABLET BY MOUTH TWICE DAILY WITH A MEAL Patient taking differently: Take 500 mg by mouth 2 (two) times daily with a meal. 11/07/23   Bacchus, Meade PEDLAR, FNP  methylPREDNISolone  (MEDROL  DOSEPAK) 4 MG TBPK tablet Take as directed on packaging 12/19/23   Yolande Lamar BROCKS, MD  montelukast  (SINGULAIR ) 10 MG tablet Take 1 tablet (10 mg total) by mouth at bedtime. 01/16/24  Tobie Arleta SQUIBB, MD  Multiple Vitamins-Calcium (ONE-A-DAY WOMENS PO) Take 1 tablet by mouth daily.    [provider]  NON FORMULARY Pt uses a cpap nightly    [provider]  omalizumab  (XOLAIR ) 300 MG/2  ML prefilled syringe Inject 300 mg into the skin every 28 (twenty-eight) days. 11/10/23   Iva Marty Saltness, MD  ondansetron  (ZOFRAN -ODT) 4 MG disintegrating tablet Take 1 tablet (4 mg total) by mouth every 8 (eight) hours as needed for nausea or vomiting. 09/20/23   Yolande Lamar BROCKS, MD  oxyCODONE -acetaminophen   (PERCOCET/ROXICET) 5-325 MG tablet Take 1 tablet by mouth every 6 (six) hours as needed. 01/28/24   Triplett, Tammy, PA-C  pantoprazole  (PROTONIX ) 40 MG tablet Take 1 tablet (40 mg total) by mouth 2 (two) times daily. 01/26/24   Carlan, Mitzie CROME, NP  predniSONE  (DELTASONE ) 20 MG tablet Take 2 tablets (40 mg total) by mouth daily with breakfast. For the next four days 10/24/23   Garrick Lamar, MD  sucralfate  (CARAFATE ) 1 g tablet Take 1 tablet (1 g total) by mouth 4 (four) times daily -  with meals and at bedtime. 10/24/23   Garrick Lamar, MD  SUMAtriptan  (IMITREX ) 100 MG tablet TAKE ONE TABLET BY MOUTH EVERY 2 HOURS AS NEEDED FOR MIGRAINE. MAY REPEAT IN 2 HOURS IF HEADACHE PERSISTS OR RECURS Patient taking differently: Take 100 mg by mouth as needed. 07/28/23   Bacchus, Meade PEDLAR, FNP  tamsulosin  (FLOMAX ) 0.4 MG CAPS capsule Take 1 capsule (0.4 mg total) by mouth daily after supper. 11/14/23   Leath-Warren, Etta PARAS, NP  tirzepatide  (ZEPBOUND ) 2.5 MG/0.5ML injection vial Inject 2.5 mg into the skin once a week. 10/05/23   Bevely Doffing, FNP  triamcinolone  ointment (KENALOG ) 0.1 % Apply twice daily for flare ups below neck, maximum 10 days. 04/11/23   Tobie Arleta SQUIBB, MD  Vitamin D , Ergocalciferol , (DRISDOL ) 1.25 MG (50000 UNIT) CAPS capsule Take 1 capsule (50,000 Units total) by mouth every 7 (seven) days. 06/27/23   Bacchus, Gloria Z, FNP  apixaban  (ELIQUIS ) 5 MG TABS tablet Take 2 tablets (10mg ) twice daily for 7 days, then 1 tablet (5mg ) twice daily 04/20/19 04/20/19  Meryle Ip A, PA-C  levonorgestrel  (MIRENA ) 20 MCG/24HR IUD 1 each by Intrauterine route once.   06/25/20  [provider]    Allergies: Shellfish allergy , Sulfa  antibiotics, and Adhesive [tape]    Review of Systems  All other systems reviewed and are negative.   Updated Vital Signs BP 124/72 (BP Location: Left Wrist)   Pulse 84   Temp 98.3 F (36.8 C) (Oral)   Resp 20   Ht 5' 2 (1.575 m)   Wt (!) 156.5 kg   LMP  12/26/2022 (Approximate)   SpO2 98%   BMI 63.10 kg/m   Physical Exam Vitals and nursing note reviewed.  Constitutional:      General: She is not in acute distress.    Appearance: She is well-developed. She is not diaphoretic.  HENT:     Head: Normocephalic and atraumatic.  Cardiovascular:     Rate and Rhythm: Normal rate and regular rhythm.     Heart sounds: No murmur heard.    No friction rub. No gallop.  Pulmonary:     Effort: Pulmonary effort is normal. No respiratory distress.     Breath sounds: Normal breath sounds. No wheezing.  Abdominal:     General: Bowel sounds are normal. There is no distension.     Palpations: Abdomen is soft.  Tenderness: There is no abdominal tenderness.  Musculoskeletal:        General: Normal range of motion.     Cervical back: Normal range of motion and neck supple.  Skin:    General: Skin is warm and dry.  Neurological:     General: No focal deficit present.     Mental Status: She is alert and oriented to person, place, and time.     (all labs ordered are listed, but only abnormal results are displayed) Labs Reviewed  BASIC METABOLIC PANEL WITH GFR - Abnormal; Notable for the following components:      Result Value   Calcium 8.8 (*)    All other components within normal limits  CBC - Abnormal; Notable for the following components:   Hemoglobin 10.8 (*)    HCT 34.5 (*)    All other components within normal limits  TROPONIN T, HIGH SENSITIVITY    EKG: EKG Interpretation Date/Time:  Saturday February 18 2024 22:38:39 EDT Ventricular Rate:  84 PR Interval:  138 QRS Duration:  91 QT Interval:  351 QTC Calculation: 415 R Axis:   14  Text Interpretation: Sinus rhythm since last tracing no significant change Confirmed by Cleotilde Rogue ([id number]) on 02/18/2024 10:43:51 PM  Radiology: [facility] Chest 2 View Result Date: 02/18/2024 CLINICAL DATA:  Chest pain, short of breath, lightheadedness EXAM: CHEST - 2 VIEW COMPARISON:  10/24/2023  FINDINGS: The heart size and mediastinal contours are within normal limits. Both lungs are clear. The visualized skeletal structures are unremarkable. IMPRESSION: No active cardiopulmonary disease. Electronically Signed   By: Ozell Daring M.D.   On: 02/18/2024 23:08    {Document cardiac monitor, telemetry assessment procedure when appropriate:32947} Procedures   Medications Ordered in the ED  ketorolac  (TORADOL ) 30 MG/ML injection 30 mg (has no administration in time range)      {Click here for ABCD2, HEART and other calculators REFRESH Note before signing:1}                              Medical Decision Making Amount and/or Complexity of Data Reviewed Labs: ordered. Radiology: ordered.  Risk Prescription drug management.   ***  {Document critical care time when appropriate  Document review of labs and clinical decision tools ie CHADS2VASC2, etc  Document your independent review of radiology images and any outside records  Document your discussion with family members, caretakers and with consultants  Document social determinants of health affecting pt's care  Document your decision making why or why not admission, treatments were needed:32947:::1}   Final diagnoses:  None    ED Discharge Orders     None

## 2024-02-19 LAB — TROPONIN T, HIGH SENSITIVITY: Troponin T High Sensitivity: <15 ng/L (ref 0–19)

## 2024-02-19 NOTE — Discharge Instructions (Signed)
Take ibuprofen 600 mg every 6 hours as needed for pain.  Rest.  Follow-up with primary doctor if symptoms persist, and return to the ER if symptoms significantly worsen or change. 

## 2024-02-20 ENCOUNTER — Ambulatory Visit

## 2024-02-20 ENCOUNTER — Emergency Department (HOSPITAL_COMMUNITY)
Admission: EM | Admit: 2024-02-20 | Discharge: 2024-02-20 | Disposition: A | Discharge status: Home / Self Care | Attending: Emergency Medicine | Admitting: Emergency Medicine

## 2024-02-20 ENCOUNTER — Telehealth: Payer: Self-pay

## 2024-02-20 ENCOUNTER — Encounter (HOSPITAL_COMMUNITY): Payer: Self-pay

## 2024-02-20 ENCOUNTER — Other Ambulatory Visit: Payer: Self-pay

## 2024-02-20 DIAGNOSIS — T7840XA Allergy, unspecified, initial encounter: Secondary | ICD-10-CM | POA: Insufficient documentation

## 2024-02-20 DIAGNOSIS — L501 Idiopathic urticaria: Secondary | ICD-10-CM

## 2024-02-20 DIAGNOSIS — Z7901 Long term (current) use of anticoagulants: Secondary | ICD-10-CM | POA: Insufficient documentation

## 2024-02-20 DIAGNOSIS — I10 Essential (primary) hypertension: Secondary | ICD-10-CM | POA: Diagnosis not present

## 2024-02-20 DIAGNOSIS — R221 Localized swelling, mass and lump, neck: Secondary | ICD-10-CM | POA: Diagnosis present

## 2024-02-20 LAB — CBC
HCT: 38.1 % (ref 36.0–46.0)
Hemoglobin: 11.9 g/dL — ABNORMAL LOW (ref 12.0–15.0)
MCH: 26.7 pg (ref 26.0–34.0)
MCHC: 31.2 g/dL (ref 30.0–36.0)
MCV: 85.4 fL (ref 80.0–100.0)
Platelets: 329 K/uL (ref 150–400)
RBC: 4.46 MIL/uL (ref 3.87–5.11)
RDW: 15.6 % — ABNORMAL HIGH (ref 11.5–15.5)
WBC: 5.1 K/uL (ref 4.0–10.5)
nRBC: 0 % (ref 0.0–0.2)

## 2024-02-20 LAB — BASIC METABOLIC PANEL WITH GFR
Anion gap: 11 (ref 5–15)
BUN: 13 mg/dL (ref 6–20)
CO2: 25 mmol/L (ref 22–32)
Calcium: 9.1 mg/dL (ref 8.9–10.3)
Chloride: 103 mmol/L (ref 98–111)
Creatinine, Ser: 0.64 mg/dL (ref 0.44–1.00)
GFR, Estimated: >60 mL/min (ref >60)
Glucose, Bld: 88 mg/dL (ref 70–99)
Potassium: 4.3 mmol/L (ref 3.5–5.1)
Sodium: 138 mmol/L (ref 135–145)

## 2024-02-20 MED ORDER — FAMOTIDINE IN NACL 20-0.9 MG/50ML-% IV SOLN
20.0000 mg | Freq: Once | INTRAVENOUS | Status: AC
  Administered 2024-02-20: 20 mg via INTRAVENOUS
  Filled 2024-02-20: qty 50

## 2024-02-20 MED ORDER — EPINEPHRINE 0.3 MG/0.3ML IJ SOAJ
0.3000 mg | Freq: Once | INTRAMUSCULAR | Status: AC
  Administered 2024-02-20: 0.3 mg via INTRAMUSCULAR
  Filled 2024-02-20: qty 0.3

## 2024-02-20 MED ORDER — FAMOTIDINE 20 MG PO TABS: 20.0000 mg | ORAL_TABLET | Freq: Two times a day (BID) | ORAL | 0 refills | Status: DC

## 2024-02-20 MED ORDER — PREDNISONE 20 MG PO TABS: ORAL_TABLET | ORAL | 0 refills | Status: AC

## 2024-02-20 MED ORDER — METHYLPREDNISOLONE SODIUM SUCC 125 MG IJ SOLR
125.0000 mg | Freq: Once | INTRAMUSCULAR | Status: AC
  Administered 2024-02-20: 125 mg via INTRAVENOUS
  Filled 2024-02-20: qty 2

## 2024-02-20 MED ORDER — DIPHENHYDRAMINE HCL 50 MG/ML IJ SOLN
25.0000 mg | Freq: Once | INTRAMUSCULAR | Status: AC
  Administered 2024-02-20: 25 mg via INTRAVENOUS
  Filled 2024-02-20: qty 1

## 2024-02-20 NOTE — Progress Notes (Signed)
 DAP note              Client name: Jessica Sanchez Therapist name: Jessica Sanchez SILK Date: 01/03/2024 Time: 60 mins   Data: Client reports that she is hurting. She states that she is hoping she will get the results back from her back imaging. She states that she really needs to know why she is in so much pain. She also states that she is so worried about money as she is still not getting hours at [employer]. 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 Client reports depressive symptoms are still at a 7 and would like to get that to a 2 with the use of interventions.   Assessment: The client presented in pain. She also is very stressed about money and not being able to pay her bills. It is a tricky place to be in because she needs to be working but physically her health has taken a toll. We discussed other options because she also has to allow herself the time to heal, if she keeps pushing she has the potential to get worse.    Plan: The client will attend bi-weekly sessions with a goal of attending at least 2 per month. Client will take a moment of mindfulness daily and utilize box breathing as needed to reduce symptoms. Work on healing past trauma by figuring out triggers, to help with symptom reduction, emotional regulation, and processing.

## 2024-02-20 NOTE — Discharge Instructions (Signed)
 Take Benadryl  every 4 hours for itching or rash or swelling.  Return if any problems and follow-up with your doctor in a couple days for recheck

## 2024-02-20 NOTE — ED Provider Notes (Signed)
 [place] [facility]PARTMENT AT [facility] Provider Note   CSN: [id number] Arrival date & time: 02/20/24  1036     Patient presents with: Allergic Reaction   Jessica Sanchez is a 38 y.o. female.   Patient was given an allergy  shot and developed some swelling in her throat and itching in her throat.  She gave herself an EpiPen   The history is provided by the spouse and medical records. No language interpreter was used.  Allergic Reaction Presenting symptoms: difficulty swallowing and itching   Presenting symptoms: no rash   Severity:  Moderate Prior allergic episodes:  No prior episodes Context: not animal exposure   Relieved by:  Nothing Worsened by:  Nothing Ineffective treatments:  None tried      Prior to Admission medications   Medication Sig Start Date End Date Taking? Authorizing Provider  famotidine  (PEPCID ) 20 MG tablet Take 1 tablet (20 mg total) by mouth 2 (two) times daily. 02/20/24  Yes Prajwal Fellner, MD  predniSONE  (DELTASONE ) 20 MG tablet 2 tabs po daily x 3 days 02/20/24  Yes Anginette Espejo, MD  albuterol  (VENTOLIN  HFA) 108 (90 Base) MCG/ACT inhaler Inhale 2 puffs into the lungs every 6 (six) hours as needed for shortness of breath or wheezing. 01/16/24   Tobie Arleta SQUIBB, MD  amlodipine -olmesartan  (AZOR ) 10-20 MG tablet TAKE ONE TABLET BY MOUTH EVERY DAY 01/23/24   Bacchus, Gloria Z, FNP  Blood Glucose Monitoring Suppl DEVI 1 each by Does not apply route in the morning, at noon, and at bedtime. May substitute to any manufacturer covered by patient's insurance. 01/11/23   Bacchus, Meade PEDLAR, FNP  cyclobenzaprine  (FLEXERIL ) 10 MG tablet Take 1 tablet (10 mg total) by mouth 3 (three) times daily as needed for muscle spasms. 12/20/23   Boswell, Chelsa, NP  diazepam  (VALIUM ) 5 MG tablet Take 1 tablet (5 mg total) by mouth once as needed for up to 1 dose for anxiety (take 15-30 minutes before procedure). 10/17/23   Bevely Doffing, FNP  EPINEPHrine  (EPIPEN  2-PAK)  0.3 mg/0.3 mL IJ SOAJ injection Inject 0.3 mg into the muscle as needed for anaphylaxis. 01/16/24   Tobie Arleta SQUIBB, MD  fluticasone  (FLONASE ) 50 MCG/ACT nasal spray Place 2 sprays into both nostrils daily. 01/16/24   Tobie Arleta SQUIBB, MD  Fluticasone -Umeclidin-Vilant (TRELEGY ELLIPTA ) 200-62.5-25 MCG/ACT AEPB Inhale 1 puff into the lungs daily. 01/16/24   Tobie Arleta SQUIBB, MD  gabapentin  (NEURONTIN ) 300 MG capsule Take 1 capsule (300 mg total) by mouth at bedtime. 06/13/23   Bacchus, Gloria Z, FNP  ketorolac  (TORADOL ) 10 MG tablet Take 1 tablet (10 mg total) by mouth every 6 (six) hours as needed. 12/20/23   Boswell, Chelsa, NP  levocetirizine (XYZAL ) 5 MG tablet Take 2 tablets (10 mg total) by mouth 2 (two) times daily as needed for allergies (hives/itching). 01/16/24   Tobie Arleta SQUIBB, MD  linaclotide  (LINZESS ) 290 MCG CAPS capsule Take 1 capsule (290 mcg total) by mouth daily before breakfast. 01/26/24   Carlan, Chelsea L, NP  metFORMIN  (GLUCOPHAGE ) 1000 MG tablet TAKE ONE-HALF TABLET BY MOUTH TWICE DAILY WITH A MEAL Patient taking differently: Take 500 mg by mouth 2 (two) times daily with a meal. 11/07/23   Bacchus, Meade PEDLAR, FNP  methylPREDNISolone  (MEDROL  DOSEPAK) 4 MG TBPK tablet Take as directed on packaging 12/19/23   Yolande Lamar BROCKS, MD  montelukast  (SINGULAIR ) 10 MG tablet Take 1 tablet (10 mg total) by mouth at bedtime. 01/16/24   Tobie Arleta SQUIBB,  MD  Multiple Vitamins-Calcium (ONE-A-DAY WOMENS PO) Take 1 tablet by mouth daily.    [provider]  NON FORMULARY Pt uses a cpap nightly    [provider]  omalizumab  (XOLAIR ) 300 MG/2  ML prefilled syringe Inject 300 mg into the skin every 28 (twenty-eight) days. 11/10/23   Iva Marty Saltness, MD  ondansetron  (ZOFRAN -ODT) 4 MG disintegrating tablet Take 1 tablet (4 mg total) by mouth every 8 (eight) hours as needed for nausea or vomiting. 09/20/23   Yolande Lamar BROCKS, MD  oxyCODONE -acetaminophen  (PERCOCET/ROXICET) 5-325 MG tablet Take 1  tablet by mouth every 6 (six) hours as needed. 01/28/24   Triplett, Tammy, PA-C  pantoprazole  (PROTONIX ) 40 MG tablet Take 1 tablet (40 mg total) by mouth 2 (two) times daily. 01/26/24   Carlan, Chelsea L, NP  sucralfate  (CARAFATE ) 1 g tablet Take 1 tablet (1 g total) by mouth 4 (four) times daily -  with meals and at bedtime. 10/24/23   Garrick Lamar, MD  SUMAtriptan  (IMITREX ) 100 MG tablet TAKE ONE TABLET BY MOUTH EVERY 2 HOURS AS NEEDED FOR MIGRAINE. MAY REPEAT IN 2 HOURS IF HEADACHE PERSISTS OR RECURS Patient taking differently: Take 100 mg by mouth as needed. 07/28/23   Bacchus, Meade PEDLAR, FNP  tamsulosin  (FLOMAX ) 0.4 MG CAPS capsule Take 1 capsule (0.4 mg total) by mouth daily after supper. 11/14/23   Leath-Warren, Etta PARAS, NP  tirzepatide  (ZEPBOUND ) 2.5 MG/0.5ML injection vial Inject 2.5 mg into the skin once a week. 10/05/23   Bevely Doffing, FNP  triamcinolone  ointment (KENALOG ) 0.1 % Apply twice daily for flare ups below neck, maximum 10 days. 04/11/23   Tobie Arleta SQUIBB, MD  Vitamin D , Ergocalciferol , (DRISDOL ) 1.25 MG (50000 UNIT) CAPS capsule Take 1 capsule (50,000 Units total) by mouth every 7 (seven) days. 06/27/23   Bacchus, Gloria Z, FNP  apixaban  (ELIQUIS ) 5 MG TABS tablet Take 2 tablets (10mg ) twice daily for 7 days, then 1 tablet (5mg ) twice daily 04/20/19 04/20/19  Fawze, Mina A, PA-C  levonorgestrel  (MIRENA ) 20 MCG/24HR IUD 1 each by Intrauterine route once.   06/25/20  [provider]    Allergies: Shellfish allergy , Sulfa  antibiotics, and Adhesive [tape]    Review of Systems  Constitutional:  Negative for appetite change and fatigue.  HENT:  Positive for trouble swallowing. Negative for congestion, ear discharge and sinus pressure.   Eyes:  Negative for discharge.  Respiratory:  Negative for cough.   Cardiovascular:  Negative for chest pain.  Gastrointestinal:  Negative for abdominal pain and diarrhea.  Genitourinary:  Negative for frequency and hematuria.   Musculoskeletal:  Negative for back pain.  Skin:  Positive for itching. Negative for rash.  Neurological:  Negative for seizures and headaches.  Psychiatric/Behavioral:  Negative for hallucinations.     Updated Vital Signs BP 139/73   Pulse 90   Temp 97.7 F (36.5 C) (Oral)   Resp (!) 27   Ht 5' 2 (1.575 m)   Wt (!) 156.5 kg   LMP 12/26/2022 (Approximate)   SpO2 97%   BMI 63.10 kg/m   Physical Exam Vitals and nursing note reviewed.  Constitutional:      Appearance: She is well-developed.  HENT:     Head: Normocephalic.     Comments: Uvula mildly inflamed    Mouth/Throat:     Mouth: Mucous membranes are moist.     Comments: Swelling to neck Eyes:     General: No scleral icterus.    Conjunctiva/sclera: Conjunctivae normal.  Neck:     Thyroid : No thyromegaly.  Cardiovascular:     Rate and Rhythm: Normal rate and regular rhythm.     Heart sounds: No murmur heard.    No friction rub. No gallop.  Pulmonary:     Breath sounds: No stridor. No wheezing or rales.  Chest:     Chest wall: No tenderness.  Abdominal:     General: There is no distension.     Tenderness: There is no abdominal tenderness. There is no rebound.  Musculoskeletal:        General: Normal range of motion.     Cervical back: Neck supple.  Lymphadenopathy:     Cervical: No cervical adenopathy.  Skin:    Findings: No erythema or rash.  Neurological:     Mental Status: She is alert and oriented to person, place, and time.     Motor: No abnormal muscle tone.     Coordination: Coordination normal.  Psychiatric:        Behavior: Behavior normal.     (all labs ordered are listed, but only abnormal results are displayed) Labs Reviewed  CBC - Abnormal; Notable for the following components:      Result Value   Hemoglobin 11.9 (*)    RDW 15.6 (*)    All other components within normal limits  BASIC METABOLIC PANEL WITH GFR    EKG: None  Radiology: DG Chest 2 View Result Date:  02/18/2024 CLINICAL DATA:  Chest pain, short of breath, lightheadedness EXAM: CHEST - 2 VIEW COMPARISON:  10/24/2023 FINDINGS: The heart size and mediastinal contours are within normal limits. Both lungs are clear. The visualized skeletal structures are unremarkable. IMPRESSION: No active cardiopulmonary disease. Electronically Signed   By: Ozell Daring M.D.   On: 02/18/2024 23:08     Procedures   Medications Ordered in the ED  EPINEPHrine  (EPI-PEN) injection 0.3 mg (0.3 mg Intramuscular Given 02/20/24 1116)  diphenhydrAMINE  (BENADRYL ) injection 25 mg (25 mg Intravenous Given 02/20/24 1124)  methylPREDNISolone  sodium succinate (SOLU-MEDROL ) 125 mg/2 mL injection 125 mg (125 mg Intravenous Given 02/20/24 1125)  famotidine  (PEPCID ) IVPB 20 mg premix (0 mg Intravenous Stopped 02/20/24 1152)  diphenhydrAMINE  (BENADRYL ) injection 25 mg (25 mg Intravenous Given 02/20/24 1253)  EPINEPHrine  (EPI-PEN) injection 0.3 mg (0.3 mg Intramuscular Given 02/20/24 1249)  CRITICAL CARE Performed by: Fairy Sermon Total critical care time: 45 minutes Critical care time was exclusive of separately billable procedures and treating other patients. Critical care was necessary to treat or prevent imminent or life-threatening deterioration. Critical care was time spent personally by me on the following activities: development of treatment plan with patient and/or surrogate as well as nursing, discussions with consultants, evaluation of patient's response to treatment, examination of patient, obtaining history from patient or surrogate, ordering and performing treatments and interventions, ordering and review of laboratory studies, ordering and review of radiographic studies, pulse oximetry and re-evaluation of patient's condition.                                   Medical Decision Making Amount and/or Complexity of Data Reviewed Labs: ordered.  Risk Prescription drug management.   Patient with allergic  reaction.  She was given numerous epis and Solu-Medrol  with Benadryl  and Pepcid .  Patient improved and was discharged home     Final diagnoses:  Allergic reaction, initial encounter    ED Discharge Orders  Ordered    predniSONE  (DELTASONE ) 20 MG tablet        02/20/24 1422    famotidine  (PEPCID ) 20 MG tablet  2 times daily        02/20/24 1422               Suzette Pac, MD 02/20/24 1709

## 2024-02-20 NOTE — Telephone Encounter (Signed)
 Patient called in stating that she may be having a reaction to Xolair . She received 2 Xolair  150mg  1 in each arm at 0940 . She normally gets the 300mg  prefilled syringe. I informed the patient she would be getting a sample as we did not get her medication. She said she is feeling lightheaded, dizzy, and her throat is starting to itch.  I informed Dr.Patel in office and she asked to put her in a room but the patient was already at her house. She stated she would be heading over to the ER. Informed Dr.Patel that the patient would be going to the ER and she agreed.

## 2024-02-20 NOTE — ED Triage Notes (Signed)
 Patient BIB [facility] from home for complaint of self administered allergy  injection that cause SOB and pressure to chest. gave her self EPI pen and symptoms relieved some. 50mg  of benadryl  given by EMS. 80 100% RA  BP128/86.

## 2024-02-22 ENCOUNTER — Ambulatory Visit: Admitting: Psychology

## 2024-02-22 DIAGNOSIS — F321 Major depressive disorder, single episode, moderate: Secondary | ICD-10-CM

## 2024-02-24 ENCOUNTER — Encounter (HOSPITAL_COMMUNITY)

## 2024-02-27 NOTE — Telephone Encounter (Signed)
 Noted

## 2024-02-27 NOTE — Telephone Encounter (Signed)
 My chart message sent

## 2024-02-27 NOTE — Telephone Encounter (Signed)
 I called the patient but I was unable to leave a vm as the mailbox is full and cannot accept any messages at this time.

## 2024-02-27 NOTE — Addendum Note (Signed)
 Addended by: OTHA MADELIN HERO on: 02/27/2024 11:55 AM   Modules accepted: Orders

## 2024-03-02 ENCOUNTER — Encounter (HOSPITAL_COMMUNITY)
Admission: RE | Admit: 2024-03-02 | Discharge: 2024-03-02 | Disposition: A | Discharge status: Home / Self Care | Source: Ambulatory Visit | Attending: Internal Medicine | Admitting: Internal Medicine

## 2024-03-02 ENCOUNTER — Other Ambulatory Visit: Payer: Self-pay

## 2024-03-02 ENCOUNTER — Encounter (HOSPITAL_COMMUNITY): Payer: Self-pay

## 2024-03-02 DIAGNOSIS — R32 Unspecified urinary incontinence: Secondary | ICD-10-CM | POA: Diagnosis not present

## 2024-03-05 ENCOUNTER — Ambulatory Visit (HOSPITAL_COMMUNITY): Admitting: Anesthesiology

## 2024-03-05 ENCOUNTER — Ambulatory Visit (HOSPITAL_COMMUNITY)
Admission: RE | Admit: 2024-03-05 | Discharge: 2024-03-05 | Disposition: A | Discharge status: Home / Self Care | Attending: Internal Medicine | Admitting: Internal Medicine

## 2024-03-05 ENCOUNTER — Other Ambulatory Visit: Payer: Self-pay

## 2024-03-05 ENCOUNTER — Encounter (HOSPITAL_COMMUNITY)
Admission: RE | Disposition: A | Discharge status: Home / Self Care | Payer: Self-pay | Source: Home / Self Care | Attending: Internal Medicine

## 2024-03-05 ENCOUNTER — Encounter (HOSPITAL_COMMUNITY): Payer: Self-pay | Admitting: Internal Medicine

## 2024-03-05 DIAGNOSIS — J45909 Unspecified asthma, uncomplicated: Secondary | ICD-10-CM | POA: Insufficient documentation

## 2024-03-05 DIAGNOSIS — K449 Diaphragmatic hernia without obstruction or gangrene: Secondary | ICD-10-CM | POA: Diagnosis not present

## 2024-03-05 DIAGNOSIS — G4730 Sleep apnea, unspecified: Secondary | ICD-10-CM | POA: Diagnosis not present

## 2024-03-05 DIAGNOSIS — R1013 Epigastric pain: Secondary | ICD-10-CM | POA: Diagnosis not present

## 2024-03-05 DIAGNOSIS — I10 Essential (primary) hypertension: Secondary | ICD-10-CM | POA: Diagnosis not present

## 2024-03-05 DIAGNOSIS — K2970 Gastritis, unspecified, without bleeding: Secondary | ICD-10-CM

## 2024-03-05 DIAGNOSIS — D649 Anemia, unspecified: Secondary | ICD-10-CM | POA: Insufficient documentation

## 2024-03-05 DIAGNOSIS — I471 Supraventricular tachycardia, unspecified: Secondary | ICD-10-CM | POA: Diagnosis not present

## 2024-03-05 DIAGNOSIS — K921 Melena: Secondary | ICD-10-CM | POA: Diagnosis not present

## 2024-03-05 DIAGNOSIS — K219 Gastro-esophageal reflux disease without esophagitis: Secondary | ICD-10-CM

## 2024-03-05 HISTORY — PX: ESOPHAGOGASTRODUODENOSCOPY: SHX5428

## 2024-03-05 LAB — GLUCOSE, CAPILLARY: Glucose-Capillary: 97 mg/dL (ref 70–99)

## 2024-03-05 SURGERY — EGD (ESOPHAGOGASTRODUODENOSCOPY)
Anesthesia: General

## 2024-03-05 MED ORDER — PROPOFOL 10 MG/ML IV BOLUS
INTRAVENOUS | Status: DC | PRN
  Administered 2024-03-05 (×3): 100 mg via INTRAVENOUS

## 2024-03-05 MED ORDER — LIDOCAINE HCL (PF) 1 % IJ SOLN
INTRAMUSCULAR | Status: AC
  Filled 2024-03-05: qty 2

## 2024-03-05 MED ORDER — LACTATED RINGERS IV SOLN: INTRAVENOUS | Status: DC

## 2024-03-05 MED ORDER — LIDOCAINE HCL 1 % IJ SOLN
INTRAMUSCULAR | Status: DC | PRN
  Administered 2024-03-05: 50 mg via INTRADERMAL

## 2024-03-05 MED ORDER — LIDOCAINE HCL (PF) 1 % IJ SOLN
INTRAMUSCULAR | Status: AC
  Filled 2024-03-05: qty 30

## 2024-03-05 NOTE — Anesthesia Preprocedure Evaluation (Signed)
 Anesthesia Evaluation  Patient identified by MRN, date of birth, ID band Patient awake    Reviewed: Allergy  & Precautions, H&P , NPO status , Patient's Chart, lab work & pertinent test results, reviewed documented beta blocker date and time   Airway Mallampati: III  TM Distance: >3 FB Neck ROM: full    Dental no notable dental hx. (+) Teeth Intact, Dental Advisory Given   Pulmonary neg pulmonary ROS, shortness of breath, asthma , sleep apnea    Pulmonary exam normal breath sounds clear to auscultation       Cardiovascular hypertension, Normal cardiovascular exam+ dysrhythmias Supra Ventricular Tachycardia  Rhythm:regular Rate:Normal     Neuro/Psych  Headaches  Anxiety     Peripheral neuropathy  Neuromuscular disease  negative psych ROS   GI/Hepatic negative GI ROS, Neg liver ROS,GERD  ,,  Endo/Other  diabetes, Type 2  Class 4 obesity  Renal/GU negative Renal ROS  negative genitourinary   Musculoskeletal   Abdominal  (+) + obese  Peds  Hematology negative hematology ROS (+) Blood dyscrasia, anemia   Anesthesia Other Findings   Reproductive/Obstetrics negative OB ROS                              Anesthesia Physical Anesthesia Plan  ASA: 4  Anesthesia Plan: General   Post-op Pain Management: Minimal or no pain anticipated   Induction: Intravenous  PONV Risk Score and Plan: Propofol  infusion  Airway Management Planned: Nasal Cannula and Natural Airway  Additional Equipment: None  Intra-op Plan:   Post-operative Plan:   Informed Consent: I have reviewed the patients History and Physical, chart, labs and discussed the procedure including the risks, benefits and alternatives for the proposed anesthesia with the patient or authorized representative who has indicated his/her understanding and acceptance.     Dental Advisory Given  Plan Discussed with: CRNA  Anesthesia Plan  Comments: (High flow nasal oxygen )        Anesthesia Quick Evaluation

## 2024-03-05 NOTE — Op Note (Signed)
 [facility] Patient Name: Jessica Sanchez Procedure Date: 03/05/2024 8:10 AM MRN: [id number] Date of Birth: April 29, 1986 Attending MD: Carlin POUR. Cindie , [place], [id number] CSN: [id number] Age: 38 Admit Type: Outpatient Procedure:                Upper GI endoscopy Indications:              Epigastric abdominal pain, Heartburn Providers:                Carlin POUR. Cindie, DO, Crystal Page, Chad Wilson,                            Technician Referring MD:              Medicines:                See the Anesthesia note for documentation of the                            administered medications Complications:            No immediate complications. Estimated Blood Loss:     Estimated blood loss was minimal. Procedure:                Pre-Anesthesia Assessment:                           - The anesthesia plan was to use monitored                            anesthesia care (MAC).                           After obtaining informed consent, the endoscope was                            passed under direct vision. Throughout the                            procedure, the patient's blood pressure, pulse, and                            oxygen  saturations were monitored continuously. The                            [id number] ([id number]) Upper was introduced through                            the mouth, and advanced to the second part of                            duodenum. The upper GI endoscopy was accomplished                            without difficulty. The patient tolerated the                            procedure well. Scope In: Scope  Out: 8:53:23 AM Findings:      A small hiatal hernia was present.      Patchy mild inflammation was found in the entire examined stomach.       Biopsies were taken with a cold forceps for Helicobacter pylori testing.      The duodenal bulb, first portion of the duodenum and second portion of       the duodenum were normal. Impression:               - Small hiatal  hernia.                           - Gastritis. Biopsied.                           - Normal duodenal bulb, first portion of the                            duodenum and second portion of the duodenum. Moderate Sedation:      Per Anesthesia Care Recommendation:           - Patient has a contact number available for                            emergencies. The signs and symptoms of potential                            delayed complications were discussed with the                            patient. Return to normal activities tomorrow.                            Written discharge instructions were provided to the                            patient.                           - Resume previous diet.                           - Continue present medications.                           - Await pathology results.                           - Return to GI clinic in 3 months.                           - Use Protonix  (pantoprazole ) 40 mg PO BID. Procedure Code(s):        --- Professional ---                           [phone], Esophagogastroduodenoscopy, flexible,  transoral; with biopsy, single or multiple Diagnosis Code(s):        --- Professional ---                           K44.9, Diaphragmatic hernia without obstruction or                            gangrene                           K29.70, Gastritis, unspecified, without bleeding                           R10.13, Epigastric pain                           R12, Heartburn CPT copyright 2022 American Medical Association. All rights reserved. The codes documented in this report are preliminary and upon coder review may  be revised to meet current compliance requirements. Carlin POUR. Cindie, DO Carlin POUR. [name], DO 03/05/2024 9:08:11 AM This report has been signed electronically. Number of Addenda: 0

## 2024-03-05 NOTE — H&P (Signed)
 Primary Care Physician:  Edman Meade PEDLAR, FNP Primary Gastroenterologist:  Dr. Cindie  Pre-Procedure History & Physical: HPI:  Jessica Sanchez is a 38 y.o. female is here for an EGD to be performed for epigastric pain, GERD, anemia   Past Medical History:  Diagnosis Date   Angio-edema    Anxiety    Arthritis    right shoulder   Asthma    Diabetes mellitus without complication (HCC)    Dyspnea    Dysrhythmia    hx of SVT   Fibroid (bleeding) (uterine)    Fibroid, uterine    H/O metrorrhagia    Hypertension    IUD (intrauterine device) in place 07/11/2015   Menorrhagia    Obesity, Class III, BMI 40-49.9 (morbid obesity) (HCC) 11/11/2018   Sleep apnea    uses CPAP - setting 11   SVT (supraventricular tachycardia)     Past Surgical History:  Procedure Laterality Date   DILATATION AND CURETTAGE/HYSTEROSCOPY WITH MINERVA N/A 12/01/2021   Procedure: DILATATION AND CURETTAGE/HYSTEROSCOPY WITH MINERVA;  Surgeon: Jayne Vonn DEL, MD;  Location: [facility];  Service: Gynecology;  Laterality: N/A;   HYSTERECTOMY ABDOMINAL WITH SALPINGECTOMY Bilateral 01/19/2023   Procedure: HYSTERECTOMY ABDOMINAL WITH SALPINGECTOMY;  Surgeon: Jayne Vonn DEL, MD;  Location: [facility];  Service: Gynecology;  Laterality: Bilateral;   HYSTEROSCOPY N/A 09/17/2020   Procedure: HYSTEROSCOPY;  Surgeon: Jayne Vonn DEL, MD;  Location: [facility];  Service: Gynecology;  Laterality: N/A;   HYSTEROSCOPY WITH D & C N/A 09/27/2013   Procedure: DILATATION AND CURETTAGE /HYSTEROSCOPY and insertion of Mirena  IUD ;  Surgeon: Marjorie DEL. Okey, MD;  Location: [facility];  Service: Gynecology;  Laterality: N/A;   IUD REMOVAL N/A 09/17/2020   Procedure: INTRAUTERINE DEVICE (IUD) REMOVAL (2);  Surgeon: Jayne Vonn DEL, MD;  Location: [facility];  Service: Gynecology;  Laterality: N/A;   LAPAROSCOPY N/A 01/19/2023   Procedure: LAPAROSCOPY DIAGNOSTIC;  Surgeon: Jayne Vonn DEL, MD;  Location: [facility];  Service: Gynecology;  Laterality: N/A;   MYOMECTOMY   02/03/2011   Procedure: MYOMECTOMY;  Surgeon: Vonn DEL Jayne, MD;  Location: [facility];  Service: Gynecology;  Laterality: N/A;   SVT ABLATION N/A 11/08/2018   Procedure: SVT ABLATION;  Surgeon: Waddell Danelle ORN, MD;  Location: [facility] CV [facility];  Service: Cardiovascular;  Laterality: N/A;   SVT ABLATION N/A 12/26/2020   Procedure: SVT ABLATION;  Surgeon: Waddell Danelle ORN, MD;  Location: [facility] INVASIVE CV LAB;  Service: Cardiovascular;  Laterality: N/A;   TONSILLECTOMY     TYMPANOSTOMY TUBE PLACEMENT      Prior to Admission medications   Medication Sig Start Date End Date Taking? Authorizing Provider  albuterol  (VENTOLIN  HFA) 108 (90 Base) MCG/ACT inhaler Inhale 2 puffs into the lungs every 6 (six) hours as needed for shortness of breath or wheezing. 01/16/24  Yes Tobie Arleta SQUIBB, MD  amlodipine -olmesartan  (AZOR ) 10-20 MG tablet TAKE ONE TABLET BY MOUTH EVERY DAY 01/23/24  Yes Bacchus, Gloria Z, FNP  Blood Glucose Monitoring Suppl DEVI 1 each by Does not apply route in the morning, at noon, and at bedtime. May substitute to any manufacturer covered by patient's insurance. 01/11/23  Yes Bacchus, Meade PEDLAR, FNP  cyclobenzaprine  (FLEXERIL ) 10 MG tablet Take 1 tablet (10 mg total) by mouth 3 (three) times daily as needed for muscle spasms. 12/20/23  Yes Boswell, Chelsa, NP  famotidine  (PEPCID ) 20 MG tablet Take 1 tablet (20 mg total) by mouth 2 (two) times daily. 02/20/24  Yes Zammit,  Fairy, MD  Fluticasone -Umeclidin-Vilant (TRELEGY ELLIPTA ) 200-62.5-25 MCG/ACT AEPB Inhale 1 puff into the lungs daily. 01/16/24  Yes Tobie Arleta SQUIBB, MD  gabapentin  (NEURONTIN ) 300 MG capsule Take 1 capsule (300 mg total) by mouth at bedtime. 06/13/23  Yes Bacchus, Meade PEDLAR, FNP  ketorolac  (TORADOL ) 10 MG tablet Take 1 tablet (10 mg total) by mouth every 6 (six) hours as needed. 12/20/23  Yes Boswell, Chelsa, NP  levocetirizine (XYZAL ) 5 MG tablet Take 2 tablets (10 mg total) by mouth 2 (two) times daily as needed for allergies  (hives/itching). 01/16/24  Yes Tobie Arleta SQUIBB, MD  linaclotide  (LINZESS ) 290 MCG CAPS capsule Take 1 capsule (290 mcg total) by mouth daily before breakfast. 01/26/24  Yes Carlan, Chelsea L, NP  metFORMIN  (GLUCOPHAGE ) 1000 MG tablet TAKE ONE-HALF TABLET BY MOUTH TWICE DAILY WITH A MEAL Patient taking differently: Take 500 mg by mouth 2 (two) times daily with a meal. 11/07/23  Yes Bacchus, Meade PEDLAR, FNP  montelukast  (SINGULAIR ) 10 MG tablet Take 1 tablet (10 mg total) by mouth at bedtime. 01/16/24  Yes Tobie Arleta SQUIBB, MD  ondansetron  (ZOFRAN -ODT) 4 MG disintegrating tablet Take 1 tablet (4 mg total) by mouth every 8 (eight) hours as needed for nausea or vomiting. 09/20/23  Yes Yolande Lamar BROCKS, MD  oxyCODONE -acetaminophen  (PERCOCET/ROXICET) 5-325 MG tablet Take 1 tablet by mouth every 6 (six) hours as needed. 01/28/24  Yes Triplett, Tammy, PA-C  pantoprazole  (PROTONIX ) 40 MG tablet Take 1 tablet (40 mg total) by mouth 2 (two) times daily. 01/26/24  Yes Carlan, Chelsea L, NP  tamsulosin  (FLOMAX ) 0.4 MG CAPS capsule Take 1 capsule (0.4 mg total) by mouth daily after supper. 11/14/23  Yes Leath-Warren, Etta PARAS, NP  triamcinolone  ointment (KENALOG ) 0.1 % Apply twice daily for flare ups below neck, maximum 10 days. 04/11/23  Yes Patel, Arleta SQUIBB, MD  Vitamin D , Ergocalciferol , (DRISDOL ) 1.25 MG (50000 UNIT) CAPS capsule Take 1 capsule (50,000 Units total) by mouth every 7 (seven) days. 06/27/23  Yes Bacchus, Meade PEDLAR, FNP  diazepam  (VALIUM ) 5 MG tablet Take 1 tablet (5 mg total) by mouth once as needed for up to 1 dose for anxiety (take 15-30 minutes before procedure). 10/17/23   Bevely Doffing, FNP  EPINEPHrine  (EPIPEN  2-PAK) 0.3 mg/0.3 mL IJ SOAJ injection Inject 0.3 mg into the muscle as needed for anaphylaxis. 01/16/24   Tobie Arleta SQUIBB, MD  fluticasone  (FLONASE ) 50 MCG/ACT nasal spray Place 2 sprays into both nostrils daily. 01/16/24   Tobie Arleta SQUIBB, MD  methylPREDNISolone  (MEDROL  DOSEPAK) 4 MG TBPK tablet Take as  directed on packaging 12/19/23   Yolande Lamar BROCKS, MD  Multiple Vitamins-Calcium (ONE-A-DAY WOMENS PO) Take 1 tablet by mouth daily.    [provider]  NON FORMULARY Pt uses a cpap nightly    [provider]  predniSONE  (DELTASONE ) 20 MG tablet 2 tabs po daily x 3 days 02/20/24   Suzette Fairy, MD  sucralfate  (CARAFATE ) 1 g tablet Take 1 tablet (1 g total) by mouth 4 (four) times daily -  with meals and at bedtime. 10/24/23   Garrick Lamar, MD  SUMAtriptan  (IMITREX ) 100 MG tablet TAKE ONE TABLET BY MOUTH EVERY 2 HOURS AS NEEDED FOR MIGRAINE. MAY REPEAT IN 2 HOURS IF HEADACHE PERSISTS OR RECURS Patient taking differently: Take 100 mg by mouth as needed. 07/28/23   Bacchus, Meade PEDLAR, FNP  tirzepatide  (ZEPBOUND ) 2.5 MG/0.5ML injection vial Inject 2.5 mg into the skin once a week. 10/05/23   Bevely Doffing,  FNP  apixaban  (ELIQUIS ) 5 MG TABS tablet Take 2 tablets (10mg ) twice daily for 7 days, then 1 tablet (5mg ) twice daily 04/20/19 04/20/19  Fawze, Mina A, PA-C  levonorgestrel  (MIRENA ) 20 MCG/24HR IUD 1 each by Intrauterine route once.   06/25/20  [provider]    Allergies as of 01/26/2024 - Review Complete 01/26/2024  Allergen Reaction Noted   Shellfish allergy  Shortness Of Breath, Itching, and Swelling 06/14/2016   Sulfa  antibiotics Shortness Of Breath, Itching, and Swelling 06/14/2016   Adhesive [tape] Itching 06/09/2017    Family History  Problem Relation Age of Onset   Cirrhosis Mother    Diabetes Mother    Cancer Maternal Grandfather    Hypertension Sister    Diabetes Sister    Anesthesia problems Neg Hx    Hypotension Neg Hx    Malignant hyperthermia Neg Hx    Pseudochol deficiency Neg Hx     Social History   Socioeconomic History   Marital status: Single    Spouse name: Not on file   Number of children: 0   Years of education: Not on file   Highest education level: 12th grade  Occupational History   Not on file  Tobacco Use   Smoking  status: Never   Smokeless tobacco: Never   Tobacco comments:    socially  Vaping Use   Vaping status: Never Used  Substance and Sexual Activity   Alcohol use: Not Currently   Drug use: Yes    Types: Marijuana    Comment: occassional   Sexual activity: Not Currently    Birth control/protection: Surgical    Comment: hyst  Other Topics Concern   Not on file  Social History Narrative   Not on file   Social Drivers of Health   Financial Resource Strain: Low Risk  (05/15/2023)   Overall Financial Resource Strain (CARDIA)    Difficulty of Paying Living Expenses: Not very hard  Food Insecurity: No Food Insecurity (05/15/2023)   Hunger Vital Sign    Worried About Running Out of Food in the Last Year: Never true    Ran Out of Food in the Last Year: Never true  Transportation Needs: No Transportation Needs (05/15/2023)   PRAPARE - Administrator, Civil Service (Medical): No    Lack of Transportation (Non-Medical): No  Physical Activity: Sufficiently Active (05/15/2023)   Exercise Vital Sign    Days of Exercise per Week: 5 days    Minutes of Exercise per Session: 30 min  Stress: Stress Concern Present (05/15/2023)   [employer] of Occupational Health - Occupational Stress Questionnaire    Feeling of Stress : Rather much  Social Connections: Moderately Isolated (05/15/2023)   Social Connection and Isolation Panel    Frequency of Communication with Friends and Family: More than three times a week    Frequency of Social Gatherings with Friends and Family: Once a week    Attends Religious Services: More than 4 times per year    Active Member of [employer] or Organizations: No    Attends Engineer, Structural: Not on file    Marital Status: Never married  Intimate Partner Violence: Not At Risk (01/19/2023)   Humiliation, Afraid, Rape, and Kick questionnaire    Fear of Current or Ex-Partner: No    Emotionally Abused: No    Physically Abused: No    Sexually Abused: No     Review of Systems: General: Negative for fever, chills, fatigue, weakness. Eyes: Negative for  vision changes.  ENT: Negative for hoarseness, difficulty swallowing , nasal congestion. CV: Negative for chest pain, angina, palpitations, dyspnea on exertion, peripheral edema.  Respiratory: Negative for dyspnea at rest, dyspnea on exertion, cough, sputum, wheezing.  GI: See history of present illness. GU:  Negative for dysuria, hematuria, urinary incontinence, urinary frequency, nocturnal urination.  MS: Negative for joint pain, low back pain.  Derm: Negative for rash or itching.  Neuro: Negative for weakness, abnormal sensation, seizure, frequent headaches, memory loss, confusion.  Psych: Negative for anxiety, depression Endo: Negative for unusual weight change.  Heme: Negative for bruising or bleeding. Allergy : Negative for rash or hives.  Physical Exam: Vital signs in last 24 hours: Temp:  [98.1 F (36.7 C)] 98.1 F (36.7 C) (11/03 0713) Pulse Rate:  [77] 77 (11/03 0713) Resp:  [16] 16 (11/03 0713) BP: (131)/(47) 131/47 (11/03 0713) SpO2:  [100 %] 100 % (11/03 0713) Weight:  [156.5 kg] 156.5 kg (11/03 0713)   General:   Alert,  Well-developed, well-nourished, pleasant and cooperative in NAD Head:  Normocephalic and atraumatic. Eyes:  Sclera clear, no icterus.   Conjunctiva pink. Ears:  Normal auditory acuity. Nose:  No deformity, discharge,  or lesions. Msk:  Symmetrical without gross deformities. Normal posture. Extremities:  Without clubbing or edema. Neurologic:  Alert and  oriented x4;  grossly normal neurologically. Skin:  Intact without significant lesions or rashes. Psych:  Alert and cooperative. Normal mood and affect.   Impression/Plan: Jessica Sanchez is here for an EGD to be performed for epigastric pain, GERD, anemia   Risks, benefits, limitations, imponderables and alternatives regarding procedure have been reviewed with the patient. Questions have been  answered. All parties agreeable.

## 2024-03-05 NOTE — Discharge Instructions (Addendum)
 EGD Discharge instructions Please read the instructions outlined below and refer to this sheet in the next few weeks. These discharge instructions provide you with general information on caring for yourself after you leave the hospital. Your doctor may also give you specific instructions. While your treatment has been planned according to the most current medical practices available, unavoidable complications occasionally occur. If you have any problems or questions after discharge, please call your doctor. ACTIVITY You may resume your regular activity but move at a slower pace for the next 24 hours.  Take frequent rest periods for the next 24 hours.  Walking will help expel (get rid of) the air and reduce the bloated feeling in your abdomen.  No driving for 24 hours (because of the anesthesia (medicine) used during the test).  You may shower.  Do not sign any important legal documents or operate any machinery for 24 hours (because of the anesthesia used during the test).  NUTRITION Drink plenty of fluids.  You may resume your normal diet.  Begin with a light meal and progress to your normal diet.  Avoid alcoholic beverages for 24 hours or as instructed by your caregiver.  MEDICATIONS You may resume your normal medications unless your caregiver tells you otherwise.  WHAT YOU CAN EXPECT TODAY You may experience abdominal discomfort such as a feeling of fullness or "gas" pains.  FOLLOW-UP Your doctor will discuss the results of your test with you.  SEEK IMMEDIATE MEDICAL ATTENTION IF ANY OF THE FOLLOWING OCCUR: Excessive nausea (feeling sick to your stomach) and/or vomiting.  Severe abdominal pain and distention (swelling).  Trouble swallowing.  Temperature over 101 F (37.8 C).  Rectal bleeding or vomiting of blood.    Your EGD revealed mild amount inflammation in your stomach.  I took biopsies of this to rule out infection with a bacteria called H. pylori.  Await pathology results, my  office will contact you.  You also have a small hiatal hernia. Esophagus and small bowel were normal.   Continue on pantoprazole  twice daily.   Follow up in GI office in 2-3 months.   I hope you have a great rest of your week!  Carlin POUR. Cindie, D.O. Gastroenterology and Hepatology [facility]

## 2024-03-05 NOTE — Transfer of Care (Signed)
 Immediate Anesthesia Transfer of Care Note  Patient: Jessica Sanchez  Procedure(s) Performed: EGD (ESOPHAGOGASTRODUODENOSCOPY)  Patient Location: PACU  Anesthesia Type:General  Level of Consciousness: awake  Airway & Oxygen  Therapy: Patient Spontanous Breathing  Post-op Assessment: Report given to RN  Post vital signs: Reviewed and stable  Last Vitals:  Vitals Value Taken Time  BP 125/68 03/05/24 09:15  Temp 36.9 C 03/05/24 09:09  Pulse 87 03/05/24 09:16  Resp 33 03/05/24 09:16  SpO2 97 % 03/05/24 09:16  Vitals shown include unfiled device data.  Last Pain:  Vitals:   03/05/24 0909  TempSrc:   PainSc: 0-No pain         Complications: No notable events documented.

## 2024-03-05 NOTE — Anesthesia Postprocedure Evaluation (Signed)
 Anesthesia Post Note  Patient: FLORENTINE DIEKMAN  Procedure(s) Performed: EGD (ESOPHAGOGASTRODUODENOSCOPY)  Patient location during evaluation: Phase II Anesthesia Type: General Level of consciousness: awake and alert Pain management: pain level controlled Vital Signs Assessment: post-procedure vital signs reviewed and stable Respiratory status: spontaneous breathing, nonlabored ventilation and respiratory function stable Cardiovascular status: stable Anesthetic complications: no   There were no known notable events for this encounter.   Last Vitals:  Vitals:   03/05/24 0915 03/05/24 0931  BP: 125/68 127/77  Pulse: 84 82  Resp: (!) 30 (!) 26  Temp:  36.9 C  SpO2: 97% 100%    Last Pain:  Vitals:   03/05/24 0931  TempSrc: Oral  PainSc: 0-No pain                 Meril Dray L Kymberlyn Eckford

## 2024-03-06 LAB — SURGICAL PATHOLOGY

## 2024-03-07 ENCOUNTER — Encounter (HOSPITAL_COMMUNITY): Payer: Self-pay | Admitting: Internal Medicine

## 2024-03-11 ENCOUNTER — Ambulatory Visit: Payer: Self-pay | Admitting: Internal Medicine

## 2024-03-14 ENCOUNTER — Ambulatory Visit (INDEPENDENT_AMBULATORY_CARE_PROVIDER_SITE_OTHER): Admitting: Psychology

## 2024-03-14 DIAGNOSIS — F321 Major depressive disorder, single episode, moderate: Secondary | ICD-10-CM

## 2024-03-15 ENCOUNTER — Ambulatory Visit: Admitting: Family Medicine

## 2024-03-16 ENCOUNTER — Ambulatory Visit (INDEPENDENT_AMBULATORY_CARE_PROVIDER_SITE_OTHER): Admitting: Family Medicine

## 2024-03-16 ENCOUNTER — Encounter: Payer: Self-pay | Admitting: Family Medicine

## 2024-03-16 DIAGNOSIS — E7849 Other hyperlipidemia: Secondary | ICD-10-CM

## 2024-03-16 DIAGNOSIS — E559 Vitamin D deficiency, unspecified: Secondary | ICD-10-CM

## 2024-03-16 DIAGNOSIS — M545 Low back pain, unspecified: Secondary | ICD-10-CM

## 2024-03-16 DIAGNOSIS — G43009 Migraine without aura, not intractable, without status migrainosus: Secondary | ICD-10-CM | POA: Diagnosis not present

## 2024-03-16 DIAGNOSIS — Z6841 Body Mass Index (BMI) 40.0 and over, adult: Secondary | ICD-10-CM

## 2024-03-16 MED ORDER — TIRZEPATIDE-WEIGHT MANAGEMENT 5 MG/0.5ML ~~LOC~~ SOLN: 5.0000 mg | SUBCUTANEOUS | 0 refills | Status: AC

## 2024-03-16 MED ORDER — NURTEC 75 MG PO TBDP
75.0000 mg | ORAL_TABLET | ORAL | 0 refills | Status: AC | PRN
Start: 2024-03-16 — End: ?

## 2024-03-16 MED ORDER — VITAMIN D (ERGOCALCIFEROL) 1.25 MG (50000 UNIT) PO CAPS: 50000.0000 [IU] | ORAL_CAPSULE | ORAL | 1 refills | Status: AC

## 2024-03-16 MED ORDER — NURTEC 75 MG PO TBDP: 75.0000 mg | ORAL_TABLET | ORAL | 0 refills | Status: DC | PRN

## 2024-03-16 MED ORDER — CYCLOBENZAPRINE HCL 10 MG PO TABS: 10.0000 mg | ORAL_TABLET | Freq: Three times a day (TID) | ORAL | 0 refills | Status: AC | PRN

## 2024-03-16 NOTE — Progress Notes (Signed)
 Established Patient Office Visit  Subjective:  Patient ID: Jessica Sanchez, female    DOB: 31-Mar-1986  Age: 38 y.o. MRN: [id number]  CC:  Chief Complaint  Patient presents with   Headache    Has been having headaches about every other day and sometimes her neck hurts so bad she can barely turn her head    Obesity    Wants to increase her dose of zepbound  because she hasn't been losing weight as expected    HPI Jessica Sanchez is a 38 y.o. female with past medical history of Obesity, GERD, OSA presents for f/u of  chronic medical conditions.  The patient is  presents with recurrent unilateral, pressure-like headaches occurring every other day for the past month. Pain intensity is rated 9-10/10. The headaches are associated with neck pain severe enough to impair head movement. The patient reports light sensitivity but denies nausea or vomiting. Sumatriptan  provides minimal relief. No other neurological symptoms are noted.   Past Medical History:  Diagnosis Date   Angio-edema    Anxiety    Arthritis    right shoulder   Asthma    Diabetes mellitus without complication (HCC)    Dyspnea    Dysrhythmia    hx of SVT   Fibroid (bleeding) (uterine)    Fibroid, uterine    H/O metrorrhagia    Hypertension    IUD (intrauterine device) in place 07/11/2015   Menorrhagia    Obesity, Class III, BMI 40-49.9 (morbid obesity) (HCC) 11/11/2018   Sleep apnea    uses CPAP - setting 11   SVT (supraventricular tachycardia)     Past Surgical History:  Procedure Laterality Date   DILATATION AND CURETTAGE/HYSTEROSCOPY WITH MINERVA N/A 12/01/2021   Procedure: DILATATION AND CURETTAGE/HYSTEROSCOPY WITH MINERVA;  Surgeon: Jayne Vonn DEL, MD;  Location: [facility];  Service: Gynecology;  Laterality: N/A;   ESOPHAGOGASTRODUODENOSCOPY N/A 03/05/2024   Procedure: EGD (ESOPHAGOGASTRODUODENOSCOPY);  Surgeon: Cindie Carlin POUR, DO;  Location: [facility];  Service: Endoscopy;  Laterality: N/A;  10:00am, ASA 3    HYSTERECTOMY ABDOMINAL WITH SALPINGECTOMY Bilateral 01/19/2023   Procedure: HYSTERECTOMY ABDOMINAL WITH SALPINGECTOMY;  Surgeon: Jayne Vonn DEL, MD;  Location: [facility];  Service: Gynecology;  Laterality: Bilateral;   HYSTEROSCOPY N/A 09/17/2020   Procedure: HYSTEROSCOPY;  Surgeon: Jayne Vonn DEL, MD;  Location: [facility];  Service: Gynecology;  Laterality: N/A;   HYSTEROSCOPY WITH D & C N/A 09/27/2013   Procedure: DILATATION AND CURETTAGE /HYSTEROSCOPY and insertion of Mirena  IUD ;  Surgeon: Marjorie DEL. Okey, MD;  Location: [facility];  Service: Gynecology;  Laterality: N/A;   IUD REMOVAL N/A 09/17/2020   Procedure: INTRAUTERINE DEVICE (IUD) REMOVAL (2);  Surgeon: Jayne Vonn DEL, MD;  Location: [facility];  Service: Gynecology;  Laterality: N/A;   LAPAROSCOPY N/A 01/19/2023   Procedure: LAPAROSCOPY DIAGNOSTIC;  Surgeon: Jayne Vonn DEL, MD;  Location: [facility];  Service: Gynecology;  Laterality: N/A;   MYOMECTOMY  02/03/2011   Procedure: MYOMECTOMY;  Surgeon: Vonn DEL Jayne, MD;  Location: [facility];  Service: Gynecology;  Laterality: N/A;   SVT ABLATION N/A 11/08/2018   Procedure: SVT ABLATION;  Surgeon: Waddell Danelle ORN, MD;  Location: [facility] CV [facility];  Service: Cardiovascular;  Laterality: N/A;   SVT ABLATION N/A 12/26/2020   Procedure: SVT ABLATION;  Surgeon: Waddell Danelle ORN, MD;  Location: [facility] INVAS[facility] CV LAB;  Service: Cardiovascular;  Laterality: N/A;   TONSILLECTOMY     TYMPANOSTOMY TUBE PLACEMENT  Family History  Problem Relation Age of Onset   Cirrhosis Mother    Diabetes Mother    Cancer Maternal Grandfather    Hypertension Sister    Diabetes Sister    Anesthesia problems Neg Hx    Hypotension Neg Hx    Malignant hyperthermia Neg Hx    Pseudochol deficiency Neg Hx     Social History   Socioeconomic History   Marital status: Single    Spouse name: Not on file   Number of children: 0   Years of education: Not on file   Highest education level: 12th grade  Occupational History    Not on file  Tobacco Use   Smoking status: Never   Smokeless tobacco: Never   Tobacco comments:    socially  Vaping Use   Vaping status: Never Used  Substance and Sexual Activity   Alcohol use: Not Currently   Drug use: Yes    Types: Marijuana    Comment: occassional   Sexual activity: Not Currently    Birth control/protection: Surgical    Comment: hyst  Other Topics Concern   Not on file  Social History Narrative   Not on file   Social Drivers of Health   Financial Resource Strain: Low Risk  (05/15/2023)   Overall Financial Resource Strain (CARDIA)    Difficulty of Paying Living Expenses: Not very hard  Food Insecurity: No Food Insecurity (05/15/2023)   Hunger Vital Sign    Worried About Running Out of Food in the Last Year: Never true    Ran Out of Food in the Last Year: Never true  Transportation Needs: No Transportation Needs (05/15/2023)   PRAPARE - Administrator, Civil Service (Medical): No    Lack of Transportation (Non-Medical): No  Physical Activity: Sufficiently Active (05/15/2023)   Exercise Vital Sign    Days of Exercise per Week: 5 days    Minutes of Exercise per Session: 30 min  Stress: Stress Concern Present (05/15/2023)   [employer] of Occupational Health - Occupational Stress Questionnaire    Feeling of Stress : Rather much  Social Connections: Moderately Isolated (05/15/2023)   Social Connection and Isolation Panel    Frequency of Communication with Friends and Family: More than three times a week    Frequency of Social Gatherings with Friends and Family: Once a week    Attends Religious Services: More than 4 times per year    Active Member of [employer] or Organizations: No    Attends Engineer, Structural: Not on file    Marital Status: Never married  Intimate Partner Violence: Not At Risk (01/19/2023)   Humiliation, Afraid, Rape, and Kick questionnaire    Fear of Current or Ex-Partner: No    Emotionally Abused: No    Physically  Abused: No    Sexually Abused: No    Outpatient Medications Prior to Visit  Medication Sig Dispense Refill   amlodipine -olmesartan  (AZOR ) 10-20 MG tablet TAKE ONE TABLET BY MOUTH EVERY DAY 30 tablet 1   gabapentin  (NEURONTIN ) 300 MG capsule Take 1 capsule (300 mg total) by mouth at bedtime. 90 capsule 3   methylPREDNISolone  (MEDROL  DOSEPAK) 4 MG TBPK tablet Take as directed on packaging 21 each 0   montelukast  (SINGULAIR ) 10 MG tablet Take 1 tablet (10 mg total) by mouth at bedtime. 30 tablet 5   Multiple Vitamins-Calcium (ONE-A-DAY WOMENS PO) Take 1 tablet by mouth daily.     levocetirizine (XYZAL ) 5 MG tablet Take 2  tablets (10 mg total) by mouth 2 (two) times daily as needed for allergies (hives/itching). 120 tablet 5   tirzepatide  (ZEPBOUND ) 2.5 MG/0.5ML injection vial Inject 2.5 mg into the skin once a week. 2 mL 1   albuterol  (VENTOLIN  HFA) 108 (90 Base) MCG/ACT inhaler Inhale 2 puffs into the lungs every 6 (six) hours as needed for shortness of breath or wheezing. 8 g 1   Blood Glucose Monitoring Suppl DEVI 1 each by Does not apply route in the morning, at noon, and at bedtime. May substitute to any manufacturer covered by patient's insurance. 1 each 0   diazepam  (VALIUM ) 5 MG tablet Take 1 tablet (5 mg total) by mouth once as needed for up to 1 dose for anxiety (take 15-30 minutes before procedure). 2 tablet 0   EPINEPHrine  (EPIPEN  2-PAK) 0.3 mg/0.3 mL IJ SOAJ injection Inject 0.3 mg into the muscle as needed for anaphylaxis. 2 each 1   famotidine  (PEPCID ) 20 MG tablet Take 1 tablet (20 mg total) by mouth 2 (two) times daily. 10 tablet 0   fluticasone  (FLONASE ) 50 MCG/ACT nasal spray Place 2 sprays into both nostrils daily. 16 g 5   Fluticasone -Umeclidin-Vilant (TRELEGY ELLIPTA ) 200-62.5-25 MCG/ACT AEPB Inhale 1 puff into the lungs daily. 28 each 5   ketorolac  (TORADOL ) 10 MG tablet Take 1 tablet (10 mg total) by mouth every 6 (six) hours as needed. 20 tablet 0   linaclotide  (LINZESS )  290 MCG CAPS capsule Take 1 capsule (290 mcg total) by mouth daily before breakfast.     metFORMIN  (GLUCOPHAGE ) 1000 MG tablet TAKE ONE-HALF TABLET BY MOUTH TWICE DAILY WITH A MEAL (Patient taking differently: Take 500 mg by mouth 2 (two) times daily with a meal.) 30 tablet 7   NON FORMULARY Pt uses a cpap nightly     ondansetron  (ZOFRAN -ODT) 4 MG disintegrating tablet Take 1 tablet (4 mg total) by mouth every 8 (eight) hours as needed for nausea or vomiting. 12 tablet 0   oxyCODONE -acetaminophen  (PERCOCET/ROXICET) 5-325 MG tablet Take 1 tablet by mouth every 6 (six) hours as needed. 6 tablet 0   pantoprazole  (PROTONIX ) 40 MG tablet Take 1 tablet (40 mg total) by mouth 2 (two) times daily. 60 tablet 3   predniSONE  (DELTASONE ) 20 MG tablet 2 tabs po daily x 3 days (Patient not taking: Reported on 03/16/2024) 6 tablet 0   sucralfate  (CARAFATE ) 1 g tablet Take 1 tablet (1 g total) by mouth 4 (four) times daily -  with meals and at bedtime. 28 tablet 0   tamsulosin  (FLOMAX ) 0.4 MG CAPS capsule Take 1 capsule (0.4 mg total) by mouth daily after supper. 30 capsule 0   triamcinolone  ointment (KENALOG ) 0.1 % Apply twice daily for flare ups below neck, maximum 10 days. 80 g 3   cyclobenzaprine  (FLEXERIL ) 10 MG tablet Take 1 tablet (10 mg total) by mouth 3 (three) times daily as needed for muscle spasms. 30 tablet 0   SUMAtriptan  (IMITREX ) 100 MG tablet TAKE ONE TABLET BY MOUTH EVERY 2 HOURS AS NEEDED FOR MIGRAINE. MAY REPEAT IN 2 HOURS IF HEADACHE PERSISTS OR RECURS (Patient taking differently: Take 100 mg by mouth as needed.) 10 tablet 2   Vitamin D , Ergocalciferol , (DRISDOL ) 1.25 MG (50000 UNIT) CAPS capsule Take 1 capsule (50,000 Units total) by mouth every 7 (seven) days. (Patient not taking: Reported on 03/16/2024) 20 capsule 1   No facility-administered medications prior to visit.    Allergies  Allergen Reactions   Shellfish Allergy  Shortness Of  Breath, Itching and Swelling    Mouth became swollen,  throat started itching, and developed shortness of breath   Sulfa  Antibiotics Shortness Of Breath, Itching and Swelling    Mouth became swollen, throat started itching, and developed shortness of breath   Adhesive [Tape] Itching    Depends on the type of medical tape    ROS Review of Systems  Constitutional:  Negative for chills and fever.  Eyes:  Negative for visual disturbance.  Respiratory:  Negative for chest tightness and shortness of breath.   Neurological:  Negative for dizziness and headaches.      Objective:    Physical Exam HENT:     Head: Normocephalic.     Mouth/Throat:     Mouth: Mucous membranes are moist.  Cardiovascular:     Rate and Rhythm: Normal rate.     Heart sounds: Normal heart sounds.  Pulmonary:     Effort: Pulmonary effort is normal.     Breath sounds: Normal breath sounds.  Neurological:     Mental Status: She is alert.     BP 126/77   Pulse 77   Resp 16   Ht 5' 2 (1.575 m)   Wt (!) 356 lb 6.4 oz (161.7 kg)   LMP 12/26/2022 (Approximate)   SpO2 100%   BMI 65.19 kg/m  Wt Readings from Last 3 Encounters:  03/16/24 (!) 356 lb 6.4 oz (161.7 kg)  03/05/24 (!) 345 lb (156.5 kg)  03/02/24 (!) 345 lb (156.5 kg)    Lab Results  Component Value Date   TSH 2.391 11/07/2023   Lab Results  Component Value Date   WBC 5.1 02/20/2024   HGB 11.9 (L) 02/20/2024   HCT 38.1 02/20/2024   MCV 85.4 02/20/2024   PLT 329 02/20/2024   Lab Results  Component Value Date   NA 138 02/20/2024   K 4.3 02/20/2024   CO2 25 02/20/2024   GLUCOSE 88 02/20/2024   BUN 13 02/20/2024   CREATININE 0.64 02/20/2024   BILITOT 0.7 01/28/2024   ALKPHOS 62 01/28/2024   AST 11 (L) 01/28/2024   ALT 12 01/28/2024   PROT 6.9 01/28/2024   ALBUMIN 3.5 01/28/2024   CALCIUM 9.1 02/20/2024   ANIONGAP 11 02/20/2024   EGFR 116 06/17/2023   Lab Results  Component Value Date   CHOL 142 06/17/2023   Lab Results  Component Value Date   HDL 40 06/17/2023   Lab  Results  Component Value Date   LDLCALC 92 06/17/2023   Lab Results  Component Value Date   TRIG 44 06/17/2023   Lab Results  Component Value Date   CHOLHDL 3.6 06/17/2023   Lab Results  Component Value Date   HGBA1C 5.9 (H) 06/17/2023      Assessment & Plan:  Morbid obesity with body mass index (BMI) of 60.0 to 69.9 in adult [facility]) Assessment & Plan: Encouraged the patient to start taking Zepbound  5 mg weekly. Also advised following a heart-healthy diet and increasing physical activity as tolerated.   Orders: -     Tirzepatide -Weight Management; Inject 5 mg into the skin once a week.  Dispense: 2 mL; Refill: 0  Migraine without aura and without status migrainosus, not intractable Assessment & Plan: Migraine Prevention and Management  Lifestyle Changes: -Decrease or Avoid Caffeine and Alcohol: Reducing intake can help minimize migraine triggers. Eat and Sleep on a Regular Schedule: Consistency in meal and sleep times can reduce the frequency of migraines. Exercise Several Times per Week:  Regular physical activity can help reduce migraine frequency and severity. Supplements to Prevent Migraines: Riboflavin (Vitamin B2): Take 400 mg daily to help prevent migraines. Migraine Rescue Plan: -Start taking Nurtec 75 mg as needed at the onset of an acute migraine episode. -Do not exceed 75 mg within a 24-hour period. Please monitor for: -Nausea -Dizziness -Fatigue -Hypersensitivity reactions (rash, swelling, difficulty breathing) -Worsening or persistent headache -Seek urgent care if you experience severe headache with neurological changes such as vision loss, weakness, confusion, or difficulty speaking.  Orders: -     Nurtec; Take 1 tablet (75 mg total) by mouth as needed.  Dispense: 4 tablet; Refill: 0  Vitamin D  deficiency -     Vitamin D  (Ergocalciferol ); Take 1 capsule (50,000 Units total) by mouth every 7 (seven) days.  Dispense: 20 capsule; Refill: 1  Other  hyperlipidemia  Acute right-sided low back pain without sciatica -     Cyclobenzaprine  HCl; Take 1 tablet (10 mg total) by mouth 3 (three) times daily as needed for muscle spasms.  Dispense: 30 tablet; Refill: 0  Note: This chart has been completed using Engineer, Civil (consulting) software, and while attempts have been made to ensure accuracy, certain words and phrases may not be transcribed as intended.    Follow-up: Return in about 5 months (around 08/14/2024).   Sid Greener  Z Bacchus, FNP

## 2024-03-16 NOTE — Patient Instructions (Addendum)
 I appreciate the opportunity to provide care to you today!    Follow up:  5 months for CPE  Migraine Prevention and Management  Lifestyle Changes: -Decrease or Avoid Caffeine and Alcohol: Reducing intake can help minimize migraine triggers. Eat and Sleep on a Regular Schedule: Consistency in meal and sleep times can reduce the frequency of migraines. Exercise Several Times per Week: Regular physical activity can help reduce migraine frequency and severity. Supplements to Prevent Migraines: Riboflavin (Vitamin B2): Take 400 mg daily to help prevent migraines. Migraine Rescue Plan: -Start taking Nurtec 75 mg as needed at the onset of an acute migraine episode. -Do not exceed 75 mg within a 24-hour period. Please monitor for: -Nausea -Dizziness -Fatigue -Hypersensitivity reactions (rash, swelling, difficulty breathing) -Worsening or persistent headache -Seek urgent care if you experience severe headache with neurological changes such as vision loss, weakness, confusion, or difficulty speaking.  Please follow up if your symptoms worsen or fail to improve.   Please continue to a heart-healthy diet and increase your physical activities. Try to exercise for at least five days a week.    It was a pleasure to see you and I look forward to continuing to work together on your health and well-being. Please do not hesitate to call the office if you need care or have questions about your care.  In case of emergency, please visit the Emergency Department for urgent care, or contact our clinic at [phone] to schedule an appointment. We're here to help you!   Have a wonderful day and week. With Gratitude, Meade JENEANE Gerlach MSN, FNP-BC, PMHNP-BC

## 2024-03-19 ENCOUNTER — Telehealth: Payer: Self-pay | Admitting: Pharmacy Technician

## 2024-03-19 ENCOUNTER — Telehealth: Payer: Self-pay | Admitting: Internal Medicine

## 2024-03-19 ENCOUNTER — Other Ambulatory Visit (HOSPITAL_COMMUNITY): Payer: Self-pay

## 2024-03-19 DIAGNOSIS — G43009 Migraine without aura, not intractable, without status migrainosus: Secondary | ICD-10-CM | POA: Insufficient documentation

## 2024-03-19 MED ORDER — LEVOCETIRIZINE DIHYDROCHLORIDE 5 MG PO TABS: ORAL_TABLET | ORAL | 5 refills | Status: DC

## 2024-03-19 NOTE — Assessment & Plan Note (Signed)
 Migraine Prevention and Management  Lifestyle Changes: -Decrease or Avoid Caffeine and Alcohol: Reducing intake can help minimize migraine triggers. Eat and Sleep on a Regular Schedule: Consistency in meal and sleep times can reduce the frequency of migraines. Exercise Several Times per Week: Regular physical activity can help reduce migraine frequency and severity. Supplements to Prevent Migraines: Riboflavin (Vitamin B2): Take 400 mg daily to help prevent migraines. Migraine Rescue Plan: -Start taking Nurtec 75 mg as needed at the onset of an acute migraine episode. -Do not exceed 75 mg within a 24-hour period. Please monitor for: -Nausea -Dizziness -Fatigue -Hypersensitivity reactions (rash, swelling, difficulty breathing) -Worsening or persistent headache -Seek urgent care if you experience severe headache with neurological changes such as vision loss, weakness, confusion, or difficulty speaking.

## 2024-03-19 NOTE — Assessment & Plan Note (Addendum)
 Encouraged the patient to start taking Zepbound  5 mg weekly. Also advised following a heart-healthy diet and increasing physical activity as tolerated.

## 2024-03-19 NOTE — Telephone Encounter (Signed)
 I called and spoke with [facility]. She instructed if we could send in levocetirizine 5mg  SIG differently. Dr. Tobie perks with changing from Take 1 tablet by mouth times daily as needed for hive severity to Take 1-2 tablets by mouth two times daily as needed for hive severity.

## 2024-03-19 NOTE — Telephone Encounter (Signed)
 [facility]  REQUEST A CALL BACK ABOUT DOSAGE FOR XYZAL  [phone]

## 2024-03-19 NOTE — Telephone Encounter (Signed)
 Pharmacy Patient Advocate Encounter   Received notification from Onbase that prior authorization for Nurtec 75MG  dispersible tablets is required/requested.   Insurance verification completed.   The patient is insured through [employer]   Per test claim: PA required; PA submitted to above mentioned insurance via Latent Key/confirmation #/EOC [id number] Status is pending

## 2024-03-20 NOTE — Telephone Encounter (Signed)
 Pharmacy Patient Advocate Encounter  Received notification from [employer] that Prior Authorization for Nurtec 75MG  dispersible tablets has been DENIED.  Full denial letter will be uploaded to the media tab. See denial reason below.   PA #/Case ID/Reference #: [id number]

## 2024-03-21 ENCOUNTER — Ambulatory Visit

## 2024-03-26 ENCOUNTER — Ambulatory Visit: Admitting: Psychology

## 2024-03-26 DIAGNOSIS — F321 Major depressive disorder, single episode, moderate: Secondary | ICD-10-CM | POA: Diagnosis not present

## 2024-04-02 ENCOUNTER — Ambulatory Visit: Payer: Self-pay

## 2024-04-02 ENCOUNTER — Telehealth (INDEPENDENT_AMBULATORY_CARE_PROVIDER_SITE_OTHER): Payer: Self-pay | Admitting: Gastroenterology

## 2024-04-02 NOTE — Telephone Encounter (Signed)
 Noted patient advised ED

## 2024-04-02 NOTE — Telephone Encounter (Signed)
 Pt left voicemail that for the last couple of days she has been peeing blood and having pain in side and abdomen.  Attempted to reach pt to get further information but voicemail is full. Unable to leave message. Pt also has message into engagement center

## 2024-04-02 NOTE — Telephone Encounter (Signed)
 Specialists unable to transfer call, technical difficulty. Nurse will attempt to contact patient.

## 2024-04-02 NOTE — Telephone Encounter (Signed)
 FYI Only or Action Required?: FYI only for provider: ED advised.  Patient was last seen in primary care on 03/16/2024 by Edman Meade PEDLAR, FNP.  Called Nurse Triage reporting Abdominal Pain.  Symptoms began a week ago.  Interventions attempted: Nothing.  Symptoms are: unchanged.  Triage Disposition: Go to ED Now (Notify PCP)  Patient/caregiver understands and will follow disposition?: Yes   Copied from CRM [id number]. Topic: Clinical - Red Word Triage >> Apr 02, 2024 11:26 AM Kevelyn M wrote: Red Word that prompted transfer to Nurse Triage: Stomach and right side pain for the last few days and peeing blood. Nauseated and vomited once today. Reason for Disposition  Black or tarry bowel movements  (Exception: Chronic-unchanged black-grey BMs AND is taking iron pills or Pepto-Bismol.)  Answer Assessment - Initial Assessment Questions Advised ED now.  Patient reports will have someone to drive to [facility] ED.  Advised 911 if symptoms worsen. Patient verbalized understanding.   1. LOCATION: Where does it hurt?      Right sided lower abd pain above and below belly buttton, blood urine, n/v; radiating to lower back 2. RADIATION: Does the pain shoot anywhere else? (e.g., chest, back)     Lower back 3. ONSET: When did the pain begin? (e.g., minutes, hours or days ago)      Week ago      5. PATTERN Does the pain come and go, or is it constant?     constant 6. SEVERITY: How bad is the pain?  (e.g., Scale 1-10; mild, moderate, or severe)     10/10      8. CAUSE: What do you think is causing the stomach pain? (e.g., gallstones, recent abdominal surgery)     03/05/24 EGD      10. OTHER SYMPTOMS: Do you have any other symptoms? (e.g., back pain, diarrhea, fever, urination pain, vomiting) Lower back pain, blood in urine, vomiting(unsure color/contents), last BM today black tarry and red blood  Protocols used: Abdominal Pain - Female-A-[facility]

## 2024-04-03 ENCOUNTER — Telehealth: Payer: Self-pay

## 2024-04-03 ENCOUNTER — Emergency Department (HOSPITAL_COMMUNITY)

## 2024-04-03 ENCOUNTER — Other Ambulatory Visit: Payer: Self-pay

## 2024-04-03 ENCOUNTER — Encounter (HOSPITAL_COMMUNITY): Payer: Self-pay | Admitting: Pharmacy Technician

## 2024-04-03 ENCOUNTER — Emergency Department (HOSPITAL_COMMUNITY)
Admission: EM | Admit: 2024-04-03 | Discharge: 2024-04-03 | Disposition: A | Discharge status: Home / Self Care | Attending: Emergency Medicine | Admitting: Emergency Medicine

## 2024-04-03 DIAGNOSIS — R932 Abnormal findings on diagnostic imaging of liver and biliary tract: Secondary | ICD-10-CM | POA: Diagnosis not present

## 2024-04-03 DIAGNOSIS — R1011 Right upper quadrant pain: Secondary | ICD-10-CM | POA: Diagnosis not present

## 2024-04-03 DIAGNOSIS — R109 Unspecified abdominal pain: Secondary | ICD-10-CM

## 2024-04-03 DIAGNOSIS — R1031 Right lower quadrant pain: Secondary | ICD-10-CM | POA: Diagnosis not present

## 2024-04-03 DIAGNOSIS — Z7901 Long term (current) use of anticoagulants: Secondary | ICD-10-CM | POA: Diagnosis not present

## 2024-04-03 DIAGNOSIS — K429 Umbilical hernia without obstruction or gangrene: Secondary | ICD-10-CM | POA: Diagnosis not present

## 2024-04-03 LAB — URINALYSIS, ROUTINE W REFLEX MICROSCOPIC
Bilirubin Urine: NEGATIVE
Glucose, UA: NEGATIVE mg/dL
Hgb urine dipstick: NEGATIVE
Ketones, ur: NEGATIVE mg/dL
Leukocytes,Ua: NEGATIVE
Nitrite: NEGATIVE
Protein, ur: NEGATIVE mg/dL
Specific Gravity, Urine: 1.02 (ref 1.005–1.030)
pH: 5 (ref 5.0–8.0)

## 2024-04-03 LAB — COMPREHENSIVE METABOLIC PANEL WITH GFR
ALT: 7 U/L (ref 0–44)
AST: 15 U/L (ref 15–41)
Albumin: 4.1 g/dL (ref 3.5–5.0)
Alkaline Phosphatase: 78 U/L (ref 38–126)
Anion gap: 11 (ref 5–15)
BUN: 10 mg/dL (ref 6–20)
CO2: 24 mmol/L (ref 22–32)
Calcium: 8.7 mg/dL — ABNORMAL LOW (ref 8.9–10.3)
Chloride: 104 mmol/L (ref 98–111)
Creatinine, Ser: 0.56 mg/dL (ref 0.44–1.00)
GFR, Estimated: >60 mL/min (ref >60)
Glucose, Bld: 85 mg/dL (ref 70–99)
Potassium: 4.2 mmol/L (ref 3.5–5.1)
Sodium: 138 mmol/L (ref 135–145)
Total Bilirubin: 0.5 mg/dL (ref 0.0–1.2)
Total Protein: 6.9 g/dL (ref 6.5–8.1)

## 2024-04-03 LAB — CBC
HCT: 34.1 % — ABNORMAL LOW (ref 36.0–46.0)
Hemoglobin: 10.8 g/dL — ABNORMAL LOW (ref 12.0–15.0)
MCH: 26.9 pg (ref 26.0–34.0)
MCHC: 31.7 g/dL (ref 30.0–36.0)
MCV: 84.8 fL (ref 80.0–100.0)
Platelets: 309 K/uL (ref 150–400)
RBC: 4.02 MIL/uL (ref 3.87–5.11)
RDW: 15.8 % — ABNORMAL HIGH (ref 11.5–15.5)
WBC: 5.6 K/uL (ref 4.0–10.5)
nRBC: 0 % (ref 0.0–0.2)

## 2024-04-03 LAB — HCG, SERUM, QUALITATIVE: Preg, Serum: NEGATIVE

## 2024-04-03 LAB — LIPASE, BLOOD: Lipase: 13 U/L (ref 11–51)

## 2024-04-03 MED ORDER — ONDANSETRON 4 MG PO TBDP: 4.0000 mg | ORAL_TABLET | Freq: Three times a day (TID) | ORAL | 0 refills | Status: DC | PRN

## 2024-04-03 MED ORDER — SODIUM CHLORIDE 0.9 % IV BOLUS
1000.0000 mL | Freq: Once | INTRAVENOUS | Status: AC
  Administered 2024-04-03: 1000 mL via INTRAVENOUS

## 2024-04-03 MED ORDER — ONDANSETRON HCL 4 MG/2ML IJ SOLN
4.0000 mg | Freq: Once | INTRAMUSCULAR | Status: AC
  Administered 2024-04-03: 4 mg via INTRAVENOUS
  Filled 2024-04-03: qty 2

## 2024-04-03 MED ORDER — KETOROLAC TROMETHAMINE 15 MG/ML IJ SOLN
15.0000 mg | Freq: Once | INTRAMUSCULAR | Status: AC
  Administered 2024-04-03: 15 mg via INTRAVENOUS
  Filled 2024-04-03: qty 1

## 2024-04-03 MED ORDER — DICYCLOMINE HCL 20 MG PO TABS: 20.0000 mg | ORAL_TABLET | Freq: Two times a day (BID) | ORAL | 0 refills | Status: DC

## 2024-04-03 MED ORDER — MORPHINE SULFATE (PF) 4 MG/ML IV SOLN
4.0000 mg | Freq: Once | INTRAVENOUS | Status: AC
  Administered 2024-04-03: 4 mg via INTRAVENOUS
  Filled 2024-04-03: qty 1

## 2024-04-03 MED ORDER — IOHEXOL 300 MG/ML  SOLN
100.0000 mL | Freq: Once | INTRAMUSCULAR | Status: AC | PRN
  Administered 2024-04-03: 100 mL via INTRAVENOUS

## 2024-04-03 NOTE — ED Provider Notes (Signed)
 [facility] EMERGENCY DEPARTMENT AT [facility] Provider Note   CSN: [id number] Arrival date & time: 04/03/24  9141     Patient presents with: Abdominal Pain, Hematuria, and Emesis   Jessica Sanchez is a 38 y.o. female.   Is a 38 year old female who presents to the Emergency Department with a chief complaint of right side abdominal pain and hematuria as well as right-sided flank pain which has been ongoing for approximate the past 2 days.  She denies any associated dysuria.  She has had vomiting over the past 2 days as well as some intermittent diarrhea.  Patient denies any associated fever or chills.  She has had no chest pain or shortness of breath.  She denies any dizziness, lightheadedness or syncope.   Abdominal Pain Associated symptoms: hematuria and vomiting   Hematuria Associated symptoms include abdominal pain.  Emesis Associated symptoms: abdominal pain        Prior to Admission medications   Medication Sig Start Date End Date Taking? Authorizing Provider  albuterol  (VENTOLIN  HFA) 108 (90 Base) MCG/ACT inhaler Inhale 2 puffs into the lungs every 6 (six) hours as needed for shortness of breath or wheezing. 01/16/24   Tobie Arleta SQUIBB, MD  amlodipine -olmesartan  (AZOR ) 10-20 MG tablet TAKE ONE TABLET BY MOUTH EVERY DAY 01/23/24   Bacchus, Gloria Z, FNP  Blood Glucose Monitoring Suppl DEVI 1 each by Does not apply route in the morning, at noon, and at bedtime. May substitute to any manufacturer covered by patient's insurance. 01/11/23   Bacchus, Meade PEDLAR, FNP  cyclobenzaprine  (FLEXERIL ) 10 MG tablet Take 1 tablet (10 mg total) by mouth 3 (three) times daily as needed for muscle spasms. 03/16/24   Bacchus, Gloria Z, FNP  diazepam  (VALIUM ) 5 MG tablet Take 1 tablet (5 mg total) by mouth once as needed for up to 1 dose for anxiety (take 15-30 minutes before procedure). 10/17/23   Bevely Doffing, FNP  EPINEPHrine  (EPIPEN  2-PAK) 0.3 mg/0.3 mL IJ SOAJ injection Inject 0.3 mg into  the muscle as needed for anaphylaxis. 01/16/24   Tobie Arleta SQUIBB, MD  famotidine  (PEPCID ) 20 MG tablet Take 1 tablet (20 mg total) by mouth 2 (two) times daily. 02/20/24   Zammit, Joseph, MD  fluticasone  (FLONASE ) 50 MCG/ACT nasal spray Place 2 sprays into both nostrils daily. 01/16/24   Tobie Arleta SQUIBB, MD  Fluticasone -Umeclidin-Vilant (TRELEGY ELLIPTA ) 200-62.5-25 MCG/ACT AEPB Inhale 1 puff into the lungs daily. 01/16/24   Tobie Arleta SQUIBB, MD  gabapentin  (NEURONTIN ) 300 MG capsule Take 1 capsule (300 mg total) by mouth at bedtime. 06/13/23   Bacchus, Gloria Z, FNP  ketorolac  (TORADOL ) 10 MG tablet Take 1 tablet (10 mg total) by mouth every 6 (six) hours as needed. 12/20/23   Boswell, Chelsa, NP  levocetirizine (XYZAL ) 5 MG tablet Take 1-2 tablets by mouth two times daily as needed for hive severity. 03/19/24   Tobie Arleta SQUIBB, MD  linaclotide  (LINZESS ) 290 MCG CAPS capsule Take 1 capsule (290 mcg total) by mouth daily before breakfast. 01/26/24   Carlan, Chelsea L, NP  metFORMIN  (GLUCOPHAGE ) 1000 MG tablet TAKE ONE-HALF TABLET BY MOUTH TWICE DAILY WITH A MEAL Patient taking differently: Take 500 mg by mouth 2 (two) times daily with a meal. 11/07/23   Bacchus, Meade PEDLAR, FNP  methylPREDNISolone  (MEDROL  DOSEPAK) 4 MG TBPK tablet Take as directed on packaging 12/19/23   Yolande Lamar BROCKS, MD  montelukast  (SINGULAIR ) 10 MG tablet Take 1 tablet (10 mg total) by mouth at bedtime.  01/16/24   Tobie Arleta SQUIBB, MD  Multiple Vitamins-Calcium (ONE-A-DAY WOMENS PO) Take 1 tablet by mouth daily.    [provider]  NON FORMULARY Pt uses a cpap nightly    [provider]  ondansetron  (ZOFRAN -ODT) 4 MG disintegrating tablet Take 1 tablet (4 mg total) by mouth every 8 (eight) hours as needed for nausea or vomiting. 09/20/23   Yolande Lamar BROCKS, MD  oxyCODONE -acetaminophen  (PERCOCET/ROXICET) 5-325 MG tablet Take 1 tablet by mouth every 6 (six) hours as needed. 01/28/24   Triplett, Tammy, PA-C  pantoprazole   (PROTONIX ) 40 MG tablet Take 1 tablet (40 mg total) by mouth 2 (two) times daily. 01/26/24   Carlan, Chelsea L, NP  predniSONE  (DELTASONE ) 20 MG tablet 2 tabs po daily x 3 days Patient not taking: Reported on 03/16/2024 02/20/24   Zammit, Joseph, MD  Rimegepant Sulfate (NURTEC) 75 MG TBDP Take 1 tablet (75 mg total) by mouth as needed. 03/16/24   Bacchus, Gloria Z, FNP  sucralfate  (CARAFATE ) 1 g tablet Take 1 tablet (1 g total) by mouth 4 (four) times daily -  with meals and at bedtime. 10/24/23   Garrick Lamar, MD  tamsulosin  (FLOMAX ) 0.4 MG CAPS capsule Take 1 capsule (0.4 mg total) by mouth daily after supper. 11/14/23   Leath-Warren, Etta PARAS, NP  tirzepatide  5 MG/0.5ML injection vial Inject 5 mg into the skin once a week. 03/16/24   Bacchus, Gloria Z, FNP  triamcinolone  ointment (KENALOG ) 0.1 % Apply twice daily for flare ups below neck, maximum 10 days. 04/11/23   Tobie Arleta SQUIBB, MD  Vitamin D , Ergocalciferol , (DRISDOL ) 1.25 MG (50000 UNIT) CAPS capsule Take 1 capsule (50,000 Units total) by mouth every 7 (seven) days. 03/16/24   Bacchus, Gloria Z, FNP  apixaban  (ELIQUIS ) 5 MG TABS tablet Take 2 tablets (10mg ) twice daily for 7 days, then 1 tablet (5mg ) twice daily 04/20/19 04/20/19  Fawze, Mina A, PA-C  levonorgestrel  (MIRENA ) 20 MCG/24HR IUD 1 each by Intrauterine route once.   06/25/20  [provider]    Allergies: Shellfish allergy , Sulfa  antibiotics, and Adhesive [tape]    Review of Systems  Gastrointestinal:  Positive for abdominal pain and vomiting.  Genitourinary:  Positive for hematuria.  All other systems reviewed and are negative.   Updated Vital Signs BP (!) 163/68   Pulse 93   Temp 97.7 F (36.5 C)   Resp 18   LMP 12/26/2022 (Approximate)   SpO2 100%   Physical Exam Vitals and nursing note reviewed.  Constitutional:      General: She is not in acute distress.    Appearance: Normal appearance. She is not ill-appearing.  HENT:     Head: Normocephalic and  atraumatic.     Nose: Nose normal.     Mouth/Throat:     Mouth: Mucous membranes are moist.  Eyes:     Extraocular Movements: Extraocular movements intact.     Conjunctiva/sclera: Conjunctivae normal.     Pupils: Pupils are equal, round, and reactive to light.  Cardiovascular:     Rate and Rhythm: Normal rate and regular rhythm.     Pulses: Normal pulses.     Heart sounds: Normal heart sounds. No murmur heard.    No gallop.  Pulmonary:     Effort: Pulmonary effort is normal. No respiratory distress.     Breath sounds: Normal breath sounds. No wheezing or rales.  Abdominal:     General: Abdomen is flat. Bowel sounds are normal. There is no distension.  Palpations: Abdomen is soft.     Tenderness: There is abdominal tenderness in the right upper quadrant and right lower quadrant.     Hernia: No hernia is present.  Musculoskeletal:        General: Normal range of motion.     Cervical back: Normal range of motion and neck supple.  Skin:    General: Skin is warm and dry.     Findings: No erythema or rash.  Neurological:     General: No focal deficit present.     Mental Status: She is alert and oriented to person, place, and time. Mental status is at baseline.  Psychiatric:        Mood and Affect: Mood normal.        Behavior: Behavior normal.        Thought Content: Thought content normal.        Judgment: Judgment normal.     (all labs ordered are listed, but only abnormal results are displayed) Labs Reviewed  LIPASE, BLOOD  COMPREHENSIVE METABOLIC PANEL WITH GFR  CBC  URINALYSIS, ROUTINE W REFLEX MICROSCOPIC  HCG, SERUM, QUALITATIVE    EKG: None  Radiology: No results found.   Procedures   Medications Ordered in the ED - No data to display                                  Medical Decision Making Amount and/or Complexity of Data Reviewed Labs: ordered. Radiology: ordered.  Risk Prescription drug management.   This patient presents to the ED for  concern of abdominal pain, hematuria differential diagnosis includes acute appendicitis, cholecystitis, small bowel obstruction, diverticulitis, ovarian torsion or cyst, PID, tubo-ovarian abscess, pyelonephritis, kidney stone, pancreatitis, mesenteric ischemia    Additional history obtained:  Additional history obtained from medical records External records from outside source obtained and reviewed including medical records   Lab Tests:  I Ordered, and personally interpreted labs.  The pertinent results include: No leukocytosis, anemia at baseline, normal kidney function liver function, unremarkable electrolytes, unremarkable urinalysis, negative lipase   Imaging Studies ordered:  I ordered imaging studies including CT scan abdomen pelvis I independently visualized and interpreted imaging which showed no acute surgical process I agree with the radiologist interpretation   Medicines ordered and prescription drug management:  I ordered medication including IV fluids, Toradol , Zofran , morphine  for abdominal pain Reevaluation of the patient after these medicines showed that the patient improved I have reviewed the patients home medicines and have made adjustments as needed   Problem List / ED Course:  Patient is doing well at this time and is stable for discharge home.  Discussed with patient that all workup in the emergency department has been unremarkable.  CT scan of the abdomen pelvis demonstrated no signs of acute surgical process at this time.  Blood work has been unremarkable as well.  Urinalysis demonstrates no hematuria and no indication for urinary tract infection.  Do not suspect any further workup is warranted on an emergent basis.  The need for close follow-up with primary care doctor was discussed.  Patient voiced understanding to the plan and had no additional questions.  Strict return precautions were discussed for any new or worsening symptoms.   Social Determinants of  Health: None       Final diagnoses:  None    ED Discharge Orders     None  Daralene Lonni BIRCH, PA-C 04/03/24 1255    Darra Fonda MATSU, MD 04/12/24 [phone]

## 2024-04-03 NOTE — Telephone Encounter (Signed)
 Called the patient, spoke to the sister she was sleeping she will have her call our office to schedule an er follow up

## 2024-04-03 NOTE — Telephone Encounter (Signed)
 Copied from CRM [id number]. Topic: Clinical - Medical Advice >> Apr 03, 2024  2:53 PM Leonette P wrote: Reason for CRM: Patty with [employer] called because patient has been to the Er several times lately.  She is thinking she needs to schedule  a FU appt.  I told her we do usually do a FU with in two weeks.

## 2024-04-03 NOTE — Discharge Instructions (Signed)
 Please follow-up closely with your primary care doctor on an outpatient basis for continued evaluation.  Return to emergency department immediately for any new or worsening symptoms.

## 2024-04-03 NOTE — ED Triage Notes (Signed)
 Pt here POV with complaints of lower abdominal pain R>L. Pt also endorses hematuria for the last week. Emesis X2 yesterday with ongoing nausea.

## 2024-04-04 ENCOUNTER — Other Ambulatory Visit: Admitting: Psychology

## 2024-04-08 NOTE — Progress Notes (Signed)
 DAP note              Client name: Jessica Sanchez Therapist name: Camie Norris Higinio Mil[place] SILK Date: 01/25/2024 Time: 60 mins   Data: Client reports that she is really worried about money. 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 She reports that it looks like it will be 40 hours a week and this would really help her finances. She states that worrying about money is what causes most of her stress. Client reports depressive symptoms are still at a 7 and would like to get that to a 2 with the use of interventions.   Assessment: The client presented with anxiety. Physically she seems to be feeling a bit better, which is good while she waits for the specialist appointments. Mentally the finances continue to be a strain. Hopefully, this new job pains out. Not only would it be more hours and money but she would be able to spend more time with her grandmother. All of these things will lead to improved mental health.     Plan: The client will attend bi-weekly sessions with a goal of attending at least 2 per month. Client will take a moment of mindfulness daily and utilize box breathing as needed to reduce symptoms. Work on healing past trauma by figuring out triggers, to help with symptom reduction, emotional regulation, and processing.

## 2024-04-12 ENCOUNTER — Other Ambulatory Visit: Payer: Self-pay | Admitting: Family Medicine

## 2024-04-12 DIAGNOSIS — R7303 Prediabetes: Secondary | ICD-10-CM

## 2024-04-12 DIAGNOSIS — E1165 Type 2 diabetes mellitus with hyperglycemia: Secondary | ICD-10-CM

## 2024-04-16 ENCOUNTER — Ambulatory Visit: Admitting: Internal Medicine

## 2024-04-19 ENCOUNTER — Other Ambulatory Visit: Admitting: Psychology

## 2024-04-24 ENCOUNTER — Telehealth: Payer: Self-pay | Admitting: Pharmacy Technician

## 2024-04-24 NOTE — Telephone Encounter (Signed)
 Pharmacy Patient Advocate Encounter   Received notification from Onbase that prior authorization for Zepbound  2.5MG /0.5ML pen-injectors is due for renewal.   Insurance verification completed.   The patient is insured through [employer].  Action: Medication has been discontinued. Archived Key: [id number]

## 2024-05-02 ENCOUNTER — Ambulatory Visit (INDEPENDENT_AMBULATORY_CARE_PROVIDER_SITE_OTHER): Admitting: Internal Medicine

## 2024-05-02 ENCOUNTER — Other Ambulatory Visit: Payer: Self-pay

## 2024-05-02 ENCOUNTER — Encounter: Payer: Self-pay | Admitting: Internal Medicine

## 2024-05-02 VITALS — BP 124/72 | HR 78 | Temp 98.3°F

## 2024-05-02 DIAGNOSIS — J4540 Moderate persistent asthma, uncomplicated: Secondary | ICD-10-CM

## 2024-05-02 DIAGNOSIS — T7800XD Anaphylactic reaction due to unspecified food, subsequent encounter: Secondary | ICD-10-CM | POA: Diagnosis not present

## 2024-05-02 DIAGNOSIS — L501 Idiopathic urticaria: Secondary | ICD-10-CM | POA: Diagnosis not present

## 2024-05-02 DIAGNOSIS — J3089 Other allergic rhinitis: Secondary | ICD-10-CM

## 2024-05-02 DIAGNOSIS — J302 Other seasonal allergic rhinitis: Secondary | ICD-10-CM | POA: Diagnosis not present

## 2024-05-02 MED ORDER — FLUTICASONE PROPIONATE 50 MCG/ACT NA SUSP: 2.0000 | Freq: Every day | NASAL | 5 refills | Status: AC

## 2024-05-02 MED ORDER — TRELEGY ELLIPTA 200-62.5-25 MCG/ACT IN AEPB: 1.0000 | INHALATION_SPRAY | Freq: Every day | RESPIRATORY_TRACT | 5 refills | Status: AC

## 2024-05-02 MED ORDER — FAMOTIDINE 40 MG PO TABS: 40.0000 mg | ORAL_TABLET | Freq: Two times a day (BID) | ORAL | 5 refills | Status: AC

## 2024-05-02 MED ORDER — LEVOCETIRIZINE DIHYDROCHLORIDE 5 MG PO TABS: ORAL_TABLET | ORAL | 5 refills | Status: AC

## 2024-05-02 MED ORDER — MONTELUKAST SODIUM 10 MG PO TABS: 10.0000 mg | ORAL_TABLET | Freq: Every day | ORAL | 5 refills | Status: AC

## 2024-05-02 NOTE — Patient Instructions (Addendum)
 Moderate Persistent Asthma - Maintenance inhaler: restart Trelegy 200-62.5-25- 1 puff daily and continue Singulair  10mg  daily.  - Rescue inhaler: Albuterol  2 puffs via spacer or 1 vial via nebulizer every 4-6 hours as needed for respiratory symptoms of cough, shortness of breath, or wheezing Asthma control goals:  Full participation in all desired activities (may need albuterol  before activity) Albuterol  use two times or less a week on average (not counting use with activity) Cough interfering with sleep two times or less a month Oral steroids no more than once a year No hospitalizations  Allergic rhinitis - SPT 12/2021: positive to grass pollen, weed pollen, ragweed pollen, tree pollen, mold, dust mite, cat, dog, cockroach, and tobacco.  - Consider saline nasal rinses as needed for nasal symptoms. Use this before any medicated nasal sprays for best result - Restart Flonase  2 sprays in each nostril once a day. Aim upward and outward.  - Continue Levocetirizine (Xyzal ) 5 mg, dosing dependent on hives.  - Continue Montelukast  10 mg once a day.   - Continue Cromolyn  1-2 drops in each eye up to four times a day, if needed for itchy, watery eyes. - Consider allergy  shots as long term control of your symptoms by teaching your immune system to be more tolerant of your allergy  triggers  Hives (Urticaria) - At this time etiology of hives and swelling is unknown. Hives can be caused by a variety of different triggers including illness/infection, pressure, vibrations, extremes of temperature to name a few however majority of the time there is no identifiable trigger.  -Increase to Xyzal  (Levocetrizine) 10mg  twice daily and Pepcid  (Famotidine ) 40mg  twice daily.   - Continue Singulair  10mg  daily.  - Will message Tammy regarding approval for Rhapsido 25mg  twice daily.    Food allergy  - Continue to avoid peanut and shellfish.  - for SKIN only reaction, okay to take Xyzal  5mg  every 12 hours as needed -  for SKIN + ANY additional symptoms, OR IF concern for LIFE THREATENING reaction = Epipen  Autoinjector EpiPen  0.3 mg. - If using Epinephrine  autoinjector, call 911 or go to the ER.

## 2024-05-02 NOTE — Progress Notes (Addendum)
 "  FOLLOW UP Date of Service/Encounter:  05/02/2024   Subjective:  Jessica Sanchez (DOB: [birth year]) is a 38 y.o. female who [facility] for follow up for asthma, allergic rhinitis, urticaria and food allergies.     History obtained from: chart review and patient. Last visit  Asthma- uncontrolled, discussed restarting Trelegy and continue Singulair . AR- uncontrolled, restart Flonase , Xyzal  Food allergies- avoids peanut/shellfish Urticaria- uncontrolled, continue anti histamines + Xolair  (had only received 1 shot)  Notes having trouble with frequent hives and itching multiple times a week.  Most of the break outs are on arms and stomach. Taking Xyzal  5mg  BID and Pepcid  20mg  BID and Singulair  10mg  daily.  Had to stop Xolair  due to reaction with dizziness, lightheadedness, throat itching after the shot.  No history of bleeding, not on aspirin  or anticoagulants, normal liver function, not pregnant.    Having trouble more recently with shortness of breath and coughing. This is worse with activity. Using Albuterol  multiple times a week. No ER/urgent care/oral prednisone  use since last visit. Not taking her Trelegy, not sure why seems there is confusion between the 2 inhalers (controller/rescue).    Notes having trouble with frequent congestion and post nasal drainage.  Taking Xyzal  but not using Flonase .    Avoids shellfish/peanut, no accidental exposures, has an Epipen .     Past Medical History: Past Medical History:  Diagnosis Date   Angio-edema    Anxiety    Arthritis    right shoulder   Asthma    Diabetes mellitus without complication (HCC)    Dyspnea    Dysrhythmia    hx of SVT   Fibroid (bleeding) (uterine)    Fibroid, uterine    H/O metrorrhagia    Hypertension    IUD (intrauterine device) in place 07/11/2015   Menorrhagia    Obesity, Class III, BMI 40-49.9 (morbid obesity) (HCC) 11/11/2018   Sleep apnea    uses CPAP - setting 11    SVT (supraventricular tachycardia)     Objective:  BP 124/72 (BP Location: Right Arm, Patient Position: Sitting, Cuff Size: Large)   Pulse 78   Temp 98.3 F (36.8 C)   LMP 12/26/2022   SpO2 95%  There is no height or weight on file to calculate BMI. Physical Exam: GEN: alert, well developed HEENT: clear conjunctiva, nose with mild inferior turbinate hypertrophy, pink nasal mucosa, clear rhinorrhea, + cobblestoning HEART: regular rate and rhythm, no murmur LUNGS: clear to auscultation bilaterally, no coughing, unlabored respiration SKIN: no rashes or lesions  Spirometry:  Tracings reviewed. Her effort: Good reproducible efforts. FVC: 2.07L, 72% predicted  FEV1: 1.51L, 63% predicted FEV1/FVC ratio: 73% Interpretation: Spirometry consistent with possible restrictive disease.  Please see scanned spirometry results for details.  Assessment:   1. Idiopathic urticaria   2. Seasonal and perennial allergic rhinitis   3. Moderate persistent asthma without complication   4. Anaphylactic shock due to food, subsequent encounter     Plan/Recommendations:  Moderate Persistent Asthma - Uncontrolled, spirometry without obstruction, discussed restarting controller inhaler Trelegy and the difference between rescue/controllers.  - Maintenance inhaler: restart Trelegy 200-62.5-25- 1 puff daily and continue Singulair  10mg  daily.  - Rescue inhaler: Albuterol  2 puffs via spacer or 1 vial via nebulizer every 4-6 hours as needed for respiratory symptoms of cough, shortness of breath, or wheezing Asthma control goals:  Full participation in all desired activities (may need albuterol  before activity) Albuterol  use two times or less a  week on average (not counting use with activity) Cough interfering with sleep two times or less a month Oral steroids no more than once a year No hospitalizations  Allergic rhinitis - Uncontrolled, restart Flonase .  - SPT 12/2021: positive to grass pollen, weed  pollen, ragweed pollen, tree pollen, mold, dust mite, cat, dog, cockroach, and tobacco.  - Consider saline nasal rinses as needed for nasal symptoms. Use this before any medicated nasal sprays for best result - Restart Flonase  2 sprays in each nostril once a day. Aim upward and outward.  - Continue Levocetirizine (Xyzal ) 5 mg, dosing dependent on hives.  - Continue Montelukast  10 mg once a day.   - Continue Cromolyn  1-2 drops in each eye up to four times a day, if needed for itchy, watery eyes. - Consider allergy  shots as long term control of your symptoms by teaching your immune system to be more tolerant of your allergy  triggers  Hives (Urticaria) - Xolair  discontinued to reaction.  Remains uncontrolled on high dose anti histamines + Singulair , will work on PA for [employer].  - At this time etiology of hives and swelling is unknown. Hives can be caused by a variety of different triggers including illness/infection, pressure, vibrations, extremes of temperature to name a few however majority of the time there is no identifiable trigger.  -Increase to Xyzal  (Levocetrizine) 10mg  twice daily and Pepcid  (Famotidine ) 40mg  twice daily.   - Continue Singulair  10mg  daily.  - Will message Tammy regarding approval for Rhapsido 25mg  twice daily.  No history of liver dx (CMP 04/2024 with normal ALT/AST), not on anti platelet/anti coagulants.  Discussed need to hold 3-7 days prior to surgeries due to bleeding risk.    Food allergy  - Continue to avoid peanut and shellfish.  - for SKIN only reaction, okay to take Xyzal  5mg  every 12 hours as needed - for SKIN + ANY additional symptoms, OR IF concern for LIFE THREATENING reaction = Epipen  Autoinjector EpiPen  0.3 mg. - If using Epinephrine  autoinjector, call 911 or go to the ER.      Return in about 3 months (around 07/31/2024).  Arleta Blanch, MD [facility]       "

## 2024-05-07 ENCOUNTER — Telehealth: Payer: Self-pay

## 2024-05-07 ENCOUNTER — Other Ambulatory Visit (HOSPITAL_COMMUNITY): Payer: Self-pay

## 2024-05-07 ENCOUNTER — Telehealth: Payer: Self-pay | Admitting: *Deleted

## 2024-05-07 MED ORDER — RHAPSIDO 25 MG PO TABS
25.0000 mg | ORAL_TABLET | Freq: Two times a day (BID) | ORAL | 11 refills | Status: AC
  Filled 2024-06-05: qty 60, fill #0

## 2024-05-07 NOTE — Telephone Encounter (Signed)
 Patient has had issues in past with coverage she no longer has. I reached out but voicemail full so sent mychart message to reach out to confirm other coverage

## 2024-05-07 NOTE — Telephone Encounter (Signed)
-----   Message from Arleta Blanch, MD sent at 05/02/2024 11:46 AM EST ----- Hey Jessica Sanchez would like for her to start rhapsido . Had a reaction to Xolair  so had to discontinue.  On high dose anti histamines + Singulair  with frequent hives and itching.

## 2024-05-07 NOTE — Telephone Encounter (Signed)
 Alternative insurance found needing PA.   Primary:    Secondary:

## 2024-05-07 NOTE — Telephone Encounter (Signed)
 Tried to reach her to advise another primary plan she had this issue before that she did not reach out to [facility] plan to advise no other coverage. Voicemail full so sent mychart message to patient

## 2024-05-07 NOTE — Progress Notes (Signed)
" °    ° °  DAP note              Client name: Jessica Sanchez Therapist name: Jessica Sanchez Date: 02/22/2024 Time: 60 mins   Data: Client reports that she is doing much better. She states that she has not been here because she was finally able to get a new job. 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 She states the only thing that really has her worried is the fact that her best friend has been in the hospital and she is very worried about her health. Client reports depressive symptoms are still at a 5 and would like to get that to a 2 with the use of interventions.   Assessment: The client presented with a calm presence. She was alert and oriented. She was a upbeat about her new position. Her finances were the main cause of her stress. She is now worried about her friend who is very sick, she plans to go spend time with her this week.    Plan: The client will attend bi-weekly sessions with a goal of attending at least 2 per month. Client will take a moment of mindfulness daily and utilize box breathing as needed to reduce symptoms. Work on healing past trauma by figuring out triggers, to help with symptom reduction, emotional regulation, and processing.                                 "

## 2024-05-10 ENCOUNTER — Emergency Department (HOSPITAL_COMMUNITY)

## 2024-05-10 ENCOUNTER — Other Ambulatory Visit: Payer: Self-pay

## 2024-05-10 ENCOUNTER — Encounter (HOSPITAL_COMMUNITY): Payer: Self-pay

## 2024-05-10 ENCOUNTER — Emergency Department (HOSPITAL_COMMUNITY): Admission: EM | Admit: 2024-05-10 | Discharge: 2024-05-10 | Discharge status: Against Medical Advice

## 2024-05-10 ENCOUNTER — Other Ambulatory Visit: Admitting: Psychology

## 2024-05-10 DIAGNOSIS — R072 Precordial pain: Secondary | ICD-10-CM | POA: Diagnosis present

## 2024-05-10 DIAGNOSIS — R42 Dizziness and giddiness: Secondary | ICD-10-CM | POA: Diagnosis not present

## 2024-05-10 DIAGNOSIS — R109 Unspecified abdominal pain: Secondary | ICD-10-CM | POA: Diagnosis not present

## 2024-05-10 DIAGNOSIS — Z5321 Procedure and treatment not carried out due to patient leaving prior to being seen by health care provider: Secondary | ICD-10-CM | POA: Diagnosis not present

## 2024-05-10 LAB — COMPREHENSIVE METABOLIC PANEL WITH GFR
ALT: 7 U/L (ref 0–44)
AST: 16 U/L (ref 15–41)
Albumin: 3.9 g/dL (ref 3.5–5.0)
Alkaline Phosphatase: 70 U/L (ref 38–126)
Anion gap: 11 (ref 5–15)
BUN: 10 mg/dL (ref 6–20)
CO2: 20 mmol/L — ABNORMAL LOW (ref 22–32)
Calcium: 8.8 mg/dL — ABNORMAL LOW (ref 8.9–10.3)
Chloride: 107 mmol/L (ref 98–111)
Creatinine, Ser: 0.52 mg/dL (ref 0.44–1.00)
GFR, Estimated: >60 mL/min
Glucose, Bld: 99 mg/dL (ref 70–99)
Potassium: 4.4 mmol/L (ref 3.5–5.1)
Sodium: 138 mmol/L (ref 135–145)
Total Bilirubin: 0.3 mg/dL (ref 0.0–1.2)
Total Protein: 7 g/dL (ref 6.5–8.1)

## 2024-05-10 LAB — CBC
HCT: 37.1 % (ref 36.0–46.0)
Hemoglobin: 11.1 g/dL — ABNORMAL LOW (ref 12.0–15.0)
MCH: 26.6 pg (ref 26.0–34.0)
MCHC: 29.9 g/dL — ABNORMAL LOW (ref 30.0–36.0)
MCV: 88.8 fL (ref 80.0–100.0)
Platelets: 273 K/uL (ref 150–400)
RBC: 4.18 MIL/uL (ref 3.87–5.11)
RDW: 15.9 % — ABNORMAL HIGH (ref 11.5–15.5)
WBC: 5.8 K/uL (ref 4.0–10.5)
nRBC: 0 % (ref 0.0–0.2)

## 2024-05-10 LAB — URINALYSIS, ROUTINE W REFLEX MICROSCOPIC
Bacteria, UA: NONE SEEN
Bilirubin Urine: NEGATIVE
Glucose, UA: NEGATIVE mg/dL
Ketones, ur: NEGATIVE mg/dL
Leukocytes,Ua: NEGATIVE
Nitrite: NEGATIVE
Protein, ur: NEGATIVE mg/dL
Specific Gravity, Urine: 1.016 (ref 1.005–1.030)
pH: 7 (ref 5.0–8.0)

## 2024-05-10 LAB — TROPONIN T, HIGH SENSITIVITY
Troponin T High Sensitivity: <15 ng/L (ref 0–19)
Troponin T High Sensitivity: <15 ng/L (ref 0–19)

## 2024-05-10 LAB — LIPASE, BLOOD: Lipase: 27 U/L (ref 11–51)

## 2024-05-10 NOTE — ED Triage Notes (Signed)
 Pt reports 2 days of CP, dizziness, and abdd pain. No SHOB, no n/v/d. CP is substernal with no radiation.

## 2024-05-15 ENCOUNTER — Emergency Department (HOSPITAL_COMMUNITY)
Admission: EM | Admit: 2024-05-15 | Discharge: 2024-05-15 | Disposition: A | Discharge status: Home / Self Care | Attending: Emergency Medicine | Admitting: Emergency Medicine

## 2024-05-15 ENCOUNTER — Encounter (HOSPITAL_COMMUNITY): Payer: Self-pay

## 2024-05-15 ENCOUNTER — Emergency Department (HOSPITAL_COMMUNITY)

## 2024-05-15 ENCOUNTER — Other Ambulatory Visit: Payer: Self-pay

## 2024-05-15 DIAGNOSIS — Z79899 Other long term (current) drug therapy: Secondary | ICD-10-CM | POA: Insufficient documentation

## 2024-05-15 DIAGNOSIS — R197 Diarrhea, unspecified: Secondary | ICD-10-CM | POA: Diagnosis not present

## 2024-05-15 DIAGNOSIS — R1110 Vomiting, unspecified: Secondary | ICD-10-CM | POA: Insufficient documentation

## 2024-05-15 DIAGNOSIS — R112 Nausea with vomiting, unspecified: Secondary | ICD-10-CM

## 2024-05-15 DIAGNOSIS — R1013 Epigastric pain: Secondary | ICD-10-CM | POA: Diagnosis present

## 2024-05-15 DIAGNOSIS — R1033 Periumbilical pain: Secondary | ICD-10-CM | POA: Insufficient documentation

## 2024-05-15 DIAGNOSIS — Z7984 Long term (current) use of oral hypoglycemic drugs: Secondary | ICD-10-CM | POA: Diagnosis not present

## 2024-05-15 DIAGNOSIS — Z7901 Long term (current) use of anticoagulants: Secondary | ICD-10-CM | POA: Insufficient documentation

## 2024-05-15 LAB — COMPREHENSIVE METABOLIC PANEL WITH GFR
ALT: 9 U/L (ref 0–44)
AST: 13 U/L — ABNORMAL LOW (ref 15–41)
Albumin: 4.2 g/dL (ref 3.5–5.0)
Alkaline Phosphatase: 68 U/L (ref 38–126)
Anion gap: 14 (ref 5–15)
BUN: 12 mg/dL (ref 6–20)
CO2: 22 mmol/L (ref 22–32)
Calcium: 9 mg/dL (ref 8.9–10.3)
Chloride: 102 mmol/L (ref 98–111)
Creatinine, Ser: 0.62 mg/dL (ref 0.44–1.00)
GFR, Estimated: >60 mL/min
Glucose, Bld: 80 mg/dL (ref 70–99)
Potassium: 4.1 mmol/L (ref 3.5–5.1)
Sodium: 138 mmol/L (ref 135–145)
Total Bilirubin: 0.5 mg/dL (ref 0.0–1.2)
Total Protein: 7.3 g/dL (ref 6.5–8.1)

## 2024-05-15 LAB — URINALYSIS, ROUTINE W REFLEX MICROSCOPIC
Bilirubin Urine: NEGATIVE
Glucose, UA: NEGATIVE mg/dL
Ketones, ur: NEGATIVE mg/dL
Nitrite: NEGATIVE
Protein, ur: NEGATIVE mg/dL
Specific Gravity, Urine: 1.025 (ref 1.005–1.030)
pH: 6 (ref 5.0–8.0)

## 2024-05-15 LAB — CBC
HCT: 36.2 % (ref 36.0–46.0)
Hemoglobin: 11 g/dL — ABNORMAL LOW (ref 12.0–15.0)
MCH: 26.4 pg (ref 26.0–34.0)
MCHC: 30.4 g/dL (ref 30.0–36.0)
MCV: 87 fL (ref 80.0–100.0)
Platelets: 285 K/uL (ref 150–400)
RBC: 4.16 MIL/uL (ref 3.87–5.11)
RDW: 15.8 % — ABNORMAL HIGH (ref 11.5–15.5)
WBC: 6 K/uL (ref 4.0–10.5)
nRBC: 0 % (ref 0.0–0.2)

## 2024-05-15 LAB — LIPASE, BLOOD: Lipase: 17 U/L (ref 11–51)

## 2024-05-15 MED ORDER — SODIUM CHLORIDE 0.9 % IV BOLUS
500.0000 mL | Freq: Once | INTRAVENOUS | Status: AC
  Administered 2024-05-15: 500 mL via INTRAVENOUS

## 2024-05-15 MED ORDER — IOHEXOL 300 MG/ML  SOLN
100.0000 mL | Freq: Once | INTRAMUSCULAR | Status: AC | PRN
  Administered 2024-05-15: 100 mL via INTRAVENOUS

## 2024-05-15 MED ORDER — MORPHINE SULFATE (PF) 4 MG/ML IV SOLN
4.0000 mg | Freq: Once | INTRAVENOUS | Status: AC
  Administered 2024-05-15: 4 mg via INTRAVENOUS
  Filled 2024-05-15: qty 1

## 2024-05-15 MED ORDER — DIPHENHYDRAMINE HCL 50 MG/ML IJ SOLN
25.0000 mg | Freq: Once | INTRAMUSCULAR | Status: AC
  Administered 2024-05-15: 25 mg via INTRAVENOUS
  Filled 2024-05-15: qty 1

## 2024-05-15 MED ORDER — METOCLOPRAMIDE HCL 5 MG/ML IJ SOLN
10.0000 mg | Freq: Once | INTRAMUSCULAR | Status: AC
  Administered 2024-05-15: 10 mg via INTRAVENOUS
  Filled 2024-05-15: qty 2

## 2024-05-15 MED ORDER — ONDANSETRON HCL 4 MG/2ML IJ SOLN
4.0000 mg | Freq: Once | INTRAMUSCULAR | Status: AC
  Administered 2024-05-15: 4 mg via INTRAVENOUS
  Filled 2024-05-15: qty 2

## 2024-05-15 MED ORDER — KETOROLAC TROMETHAMINE 30 MG/ML IJ SOLN
30.0000 mg | Freq: Once | INTRAMUSCULAR | Status: AC
  Administered 2024-05-15: 30 mg via INTRAVENOUS
  Filled 2024-05-15: qty 1

## 2024-05-15 NOTE — Progress Notes (Addendum)
" °     °  °  DAP note              Client name: [name] Therapist name: Camie Norris Higinio Milford SILK Date: 03/14/2024 Time: 60 mins   Data: Client reports that she is sad and was tearful for most of the session. She states that she misses her best friend so much. She reports that she has had to remind herself that she is not here anymore because she still picks up the phone to check in with her almost every night. She states that she is just trying to work through this grief and that she is trying but it is so hard because they were so close. Client reports depressive symptoms are at a 8 and would like to get that to a 2 with the use of interventions.   Assessment: The client presented down and depressed. She was alert and oriented. She is having a hard time with the loss of her friend. They were very close, it was like a sister to her. It is hard for her to get into a new routine without her. We discussed the importance of her processing and working through all of the emotions, no matter how long it takes because this is important for her healing.     Plan: The client will attend bi-weekly sessions with a goal of attending at least 2 per month. Client will take a moment of mindfulness daily and utilize box breathing as needed to reduce symptoms. Work on healing past trauma by figuring out triggers, to help with symptom reduction, emotional regulation, and processing.                                 "

## 2024-05-15 NOTE — ED Provider Notes (Signed)
 " [facility] AT [facility] Provider Note   CSN: [id number] Arrival date & time: 05/15/24  1332     Patient presents with: Emesis   Jessica Sanchez is a 39 y.o. female.    Emesis 39 year old female presenting with vomiting and diarrhea.  Patient reports that this has been going on for about a day now.  She has thrown up about 5 times today and had 1 or 2 bouts of diarrhea.  Neither 1 have been bloody.  She denies any changes to her diet recently.  She also reports some abdominal discomfort.  She denies any chest pain.     Prior to Admission medications  Medication Sig Start Date End Date Taking? Authorizing Provider  ACCU-CHEK GUIDE TEST test strip USE AS DIRECTED TO TEST BLOOD SUGAR THREE TIMES DAILY 04/12/24   Bacchus, Gloria Z, FNP  Accu-Chek Softclix Lancets lancets 1 each by Does not apply route in the morning, at noon, and at bedtime. May substitute to any manufacturer covered by patient's insurance. 04/12/24   Bacchus, Meade PEDLAR, FNP  albuterol  (VENTOLIN  HFA) 108 (90 Base) MCG/ACT inhaler Inhale 2 puffs into the lungs every 6 (six) hours as needed for shortness of breath or wheezing. 01/16/24   Tobie Arleta SQUIBB, MD  amlodipine -olmesartan  (AZOR ) 10-20 MG tablet TAKE ONE TABLET BY MOUTH EVERY DAY 01/23/24   Bacchus, Gloria Z, FNP  Blood Glucose Monitoring Suppl DEVI 1 each by Does not apply route in the morning, at noon, and at bedtime. May substitute to any manufacturer covered by patient's insurance. 01/11/23   Bacchus, Meade PEDLAR, FNP  cyclobenzaprine  (FLEXERIL ) 10 MG tablet Take 1 tablet (10 mg total) by mouth 3 (three) times daily as needed for muscle spasms. 03/16/24   Bacchus, Gloria Z, FNP  diazepam  (VALIUM ) 5 MG tablet Take 1 tablet (5 mg total) by mouth once as needed for up to 1 dose for anxiety (take 15-30 minutes before procedure). 10/17/23   Bevely Doffing, FNP  dicyclomine  (BENTYL ) 20 MG tablet Take 1 tablet (20 mg total) by mouth 2 (two) times  daily. 04/03/24   Daralene Lonni BIRCH, PA-C  EPINEPHrine  (EPIPEN  2-PAK) 0.3 mg/0.3 mL IJ SOAJ injection Inject 0.3 mg into the muscle as needed for anaphylaxis. 01/16/24   Tobie Arleta SQUIBB, MD  famotidine  (PEPCID ) 40 MG tablet Take 1 tablet (40 mg total) by mouth 2 (two) times daily. 05/02/24   Tobie Arleta SQUIBB, MD  fluticasone  (FLONASE ) 50 MCG/ACT nasal spray Place 2 sprays into both nostrils daily. 05/02/24   Tobie Arleta SQUIBB, MD  Fluticasone -Umeclidin-Vilant (TRELEGY ELLIPTA ) 200-62.5-25 MCG/ACT AEPB Inhale 1 puff into the lungs daily. 05/02/24   Tobie Arleta SQUIBB, MD  gabapentin  (NEURONTIN ) 300 MG capsule Take 1 capsule (300 mg total) by mouth at bedtime. 06/13/23   Bacchus, Gloria Z, FNP  ketorolac  (TORADOL ) 10 MG tablet Take 1 tablet (10 mg total) by mouth every 6 (six) hours as needed. 12/20/23   Boswell, Chelsa, NP  levocetirizine (XYZAL ) 5 MG tablet Take 1-2 tablets by mouth two times daily as needed for hive severity. 05/02/24   Tobie Arleta SQUIBB, MD  linaclotide  (LINZESS ) 290 MCG CAPS capsule Take 1 capsule (290 mcg total) by mouth daily before breakfast. 01/26/24   Carlan, Chelsea L, NP  metFORMIN  (GLUCOPHAGE ) 1000 MG tablet TAKE ONE-HALF TABLET BY MOUTH TWICE DAILY WITH A MEAL Patient taking differently: Take 500 mg by mouth 2 (two) times daily with a meal. 11/07/23   Bacchus, Meade PEDLAR,  FNP  methylPREDNISolone  (MEDROL  DOSEPAK) 4 MG TBPK tablet Take as directed on packaging 12/19/23   Yolande Lamar BROCKS, MD  montelukast  (SINGULAIR ) 10 MG tablet Take 1 tablet (10 mg total) by mouth at bedtime. 05/02/24   Tobie Arleta SQUIBB, MD  Multiple Vitamins-Calcium (ONE-A-DAY WOMENS PO) Take 1 tablet by mouth daily.    [provider]  NON FORMULARY Pt uses a cpap nightly    [provider]  ondansetron  (ZOFRAN -ODT) 4 MG disintegrating tablet Take 1 tablet (4 mg total) by mouth every 8 (eight) hours as needed for nausea or vomiting. 04/03/24   Daralene Lonni BIRCH, PA-C  oxyCODONE -acetaminophen   (PERCOCET/ROXICET) 5-325 MG tablet Take 1 tablet by mouth every 6 (six) hours as needed. 01/28/24   Triplett, Tammy, PA-C  pantoprazole  (PROTONIX ) 40 MG tablet Take 1 tablet (40 mg total) by mouth 2 (two) times daily. 01/26/24   Carlan, Mitzie CROME, NP  predniSONE  (DELTASONE ) 20 MG tablet 2 tabs po daily x 3 days Patient not taking: Reported on 03/16/2024 02/20/24   Suzette Pac, MD  Remibrutinib  (RHAPSIDO ) 25 MG TABS Take 25 mg by mouth 2 (two) times daily. 05/07/24   Tobie Arleta SQUIBB, MD  Rimegepant Sulfate (NURTEC) 75 MG TBDP Take 1 tablet (75 mg total) by mouth as needed. 03/16/24   Bacchus, Gloria Z, FNP  sucralfate  (CARAFATE ) 1 g tablet Take 1 tablet (1 g total) by mouth 4 (four) times daily -  with meals and at bedtime. Patient not taking: Reported on 05/02/2024 10/24/23   Garrick Lamar, MD  tamsulosin  (FLOMAX ) 0.4 MG CAPS capsule Take 1 capsule (0.4 mg total) by mouth daily after supper. Patient not taking: Reported on 05/02/2024 11/14/23   Leath-Warren, Etta PARAS, NP  tirzepatide  5 MG/0.5ML injection vial Inject 5 mg into the skin once a week. Patient not taking: Reported on 05/02/2024 03/16/24   Bacchus, Gloria Z, FNP  triamcinolone  ointment (KENALOG ) 0.1 % Apply twice daily for flare ups below neck, maximum 10 days. Patient taking differently: as needed. Apply twice daily for flare ups below neck, maximum 10 days. 04/11/23   Tobie Arleta SQUIBB, MD  Vitamin D , Ergocalciferol , (DRISDOL ) 1.25 MG (50000 UNIT) CAPS capsule Take 1 capsule (50,000 Units total) by mouth every 7 (seven) days. 03/16/24   Bacchus, Gloria Z, FNP  apixaban  (ELIQUIS ) 5 MG TABS tablet Take 2 tablets (10mg ) twice daily for 7 days, then 1 tablet (5mg ) twice daily 04/20/19 04/20/19  Meryle Ip A, PA-C  levonorgestrel  (MIRENA ) 20 MCG/24HR IUD 1 each by Intrauterine route once.   06/25/20  [provider]    Allergies: Shellfish allergy , Sulfa  antibiotics, and Adhesive [tape]    Review of Systems  Gastrointestinal:   Positive for vomiting.  All other systems reviewed and are negative.   Updated Vital Signs BP (!) 141/66 (BP Location: Right Arm)   Pulse 86   Temp 98 F (36.7 C) (Temporal)   Resp 16   Ht 5' 1 (1.549 m)   Wt (!) 156.5 kg   LMP 12/26/2022   SpO2 98%   BMI 65.19 kg/m   Physical Exam Vitals and nursing note reviewed.  HENT:     Mouth/Throat:     Pharynx: Oropharynx is clear.  Cardiovascular:     Rate and Rhythm: Normal rate.     Pulses: Normal pulses.  Pulmonary:     Effort: Pulmonary effort is normal.     Breath sounds: Normal breath sounds.  Abdominal:     General: Abdomen is flat.  Bowel sounds are normal.     Palpations: Abdomen is soft.     Tenderness: There is abdominal tenderness in the epigastric area and periumbilical area. There is no guarding or rebound. Negative signs include Murphy's sign, Rovsing's sign and McBurney's sign.  Skin:    General: Skin is warm and dry.  Neurological:     General: No focal deficit present.     Mental Status: She is alert.     (all labs ordered are listed, but only abnormal results are displayed) Labs Reviewed  COMPREHENSIVE METABOLIC PANEL WITH GFR - Abnormal; Notable for the following components:      Result Value   AST 13 (*)    All other components within normal limits  CBC - Abnormal; Notable for the following components:   Hemoglobin 11.0 (*)    RDW 15.8 (*)    All other components within normal limits  LIPASE, BLOOD  URINALYSIS, ROUTINE W REFLEX MICROSCOPIC    EKG: None  Radiology: No results found.   Procedures   Medications Ordered in the ED  sodium chloride  0.9 % bolus 500 mL (500 mLs Intravenous New Bag/Given 05/15/24 1443)  morphine  (PF) 4 MG/ML injection 4 mg (4 mg Intravenous Given 05/15/24 1443)  metoCLOPramide  (REGLAN ) injection 10 mg (10 mg Intravenous Given 05/15/24 1442)  diphenhydrAMINE  (BENADRYL ) injection 25 mg (25 mg Intravenous Given 05/15/24 1437)                                     Medical Decision Making Amount and/or Complexity of Data Reviewed Labs: ordered. Radiology: ordered.  Risk Prescription drug management.   Impression: 39 year old female presenting with abdominal pain with vomiting and diarrhea.  Differential diagnoses include pancreatitis, cholecystitis, small bowel obstruction, diverticulosis, diverticulitis, viral gastroenteritis, enteritis, colitis  Additional History: Patient was able to provide history.  I also reviewed other outpatient notes.  Labs: CBC with no acute changes.  CMP showed no acute changes.  Urinalysis showed no signs of infection.  Imaging: CT of her abdomen showed no acute changes.  I reviewed the images and agree with radiology's report.  ED Course/Meds: 39 year old female presenting with abdominal pain with diarrhea and vomiting.  Patient is well-appearing and in no acute distress.  Patient reports that this started yesterday.  She has had 2 bouts of diarrhea today nonbloody.  She has also thrown up about 5 times today and has not noticed any blood in her vomit.  She does have some abdominal tenderness to palpation.  Patient was given Zofran , Reglan , Benadryl , morphine , Toradol  to help with the pain.  After receiving this she says she is no longer feeling nauseous and only had a little bit of pain but it has improved much.  She does currently have a GI doctor.  Patient was able to eat without vomiting.  Likely cause of her symptoms were gastroenteritis.  Recommended that if her symptoms continue she follow-up with her GI doctor.  Patient remained stable while in the ER.  I educated on signs and symptoms of when to return to the ER.  She states that she agrees and understands.  Patient was stable at discharge.      Final diagnoses:  None    ED Discharge Orders     None          Rosaline Almarie MATSU, [place] 05/15/24 1851  "

## 2024-05-15 NOTE — ED Triage Notes (Signed)
 Pt arrived via POV c/o stomach ache that began yesterday and reports diarrhea. Pt then developed emesis this morning.

## 2024-05-15 NOTE — Discharge Instructions (Signed)
 Follow-up with your GI doctor first thing tomorrow.  If the symptoms continue follow-up with your primary care.  If you start to have any worsening symptoms or notice any blood in your vomit or stool please return to the ER.

## 2024-05-16 ENCOUNTER — Other Ambulatory Visit: Admitting: Psychology

## 2024-05-18 NOTE — Progress Notes (Unsigned)
" °     °  °  DAP note              Client name: Jessica Sanchez Therapist name: Camie Norris Higinio [place] SILK Date: 03/26/2024 Time: 60 mins   Data: Client reports that she is sad and was tearful for most of the session. She states that she misses her best friend so much. She reports that she has had to remind herself that she is not here anymore because she still picks up the phone to check in with her almost every night. She states that she is just trying to work through this grief and that she is trying but it is so hard because they were so close. Client reports depressive symptoms are at a 8 and would like to get that to a 2 with the use of interventions.   Assessment: The client presented with down and depressed. She was alert and oriented. She is having a hard time with the loss of her friend. They were very close, it was like a sister to her. It is hard for her to get into a new routine without her. We discussed the importance of her processing and working through all of the emotions, no matter how long it takes because this is important for her healing.     Plan: The client will attend bi-weekly sessions with a goal of attending at least 2 per month. Client will take a moment of mindfulness daily and utilize box breathing as needed to reduce symptoms. Work on healing past trauma by figuring out triggers, to help with symptom reduction, emotional regulation, and processing.                                     "

## 2024-05-30 ENCOUNTER — Other Ambulatory Visit: Admitting: Psychology

## 2024-06-05 ENCOUNTER — Other Ambulatory Visit: Payer: Self-pay

## 2024-06-05 NOTE — Progress Notes (Signed)
 Patient never responded to Tammy for clarification on insurance to proceed with PA. Dis-enrolling patient.

## 2024-06-06 ENCOUNTER — Other Ambulatory Visit: Payer: Self-pay

## 2024-06-06 ENCOUNTER — Emergency Department (HOSPITAL_COMMUNITY)

## 2024-06-06 ENCOUNTER — Emergency Department (HOSPITAL_COMMUNITY)
Admission: EM | Admit: 2024-06-06 | Discharge: 2024-06-06 | Disposition: A | Discharge status: Home / Self Care | Attending: Emergency Medicine | Admitting: Emergency Medicine

## 2024-06-06 ENCOUNTER — Encounter (HOSPITAL_COMMUNITY): Payer: Self-pay

## 2024-06-06 DIAGNOSIS — R1032 Left lower quadrant pain: Secondary | ICD-10-CM | POA: Insufficient documentation

## 2024-06-06 LAB — URINALYSIS, ROUTINE W REFLEX MICROSCOPIC
Bacteria, UA: NONE SEEN
Bilirubin Urine: NEGATIVE
Glucose, UA: NEGATIVE mg/dL
Ketones, ur: NEGATIVE mg/dL
Leukocytes,Ua: NEGATIVE
Nitrite: NEGATIVE
Protein, ur: NEGATIVE mg/dL
Specific Gravity, Urine: 1.016 (ref 1.005–1.030)
pH: 6 (ref 5.0–8.0)

## 2024-06-06 LAB — CBC
HCT: 35.2 % — ABNORMAL LOW (ref 36.0–46.0)
Hemoglobin: 10.9 g/dL — ABNORMAL LOW (ref 12.0–15.0)
MCH: 26.6 pg (ref 26.0–34.0)
MCHC: 31 g/dL (ref 30.0–36.0)
MCV: 85.9 fL (ref 80.0–100.0)
Platelets: 306 10*3/uL (ref 150–400)
RBC: 4.1 MIL/uL (ref 3.87–5.11)
RDW: 15.3 % (ref 11.5–15.5)
WBC: 5.3 10*3/uL (ref 4.0–10.5)
nRBC: 0 % (ref 0.0–0.2)

## 2024-06-06 LAB — BASIC METABOLIC PANEL WITH GFR
Anion gap: 12 (ref 5–15)
BUN: 9 mg/dL (ref 6–20)
CO2: 24 mmol/L (ref 22–32)
Calcium: 8.9 mg/dL (ref 8.9–10.3)
Chloride: 102 mmol/L (ref 98–111)
Creatinine, Ser: 0.63 mg/dL (ref 0.44–1.00)
GFR, Estimated: >60 mL/min
Glucose, Bld: 88 mg/dL (ref 70–99)
Potassium: 4.2 mmol/L (ref 3.5–5.1)
Sodium: 138 mmol/L (ref 135–145)

## 2024-06-06 MED ORDER — ONDANSETRON 4 MG PO TBDP: 4.0000 mg | ORAL_TABLET | Freq: Three times a day (TID) | ORAL | 0 refills | Status: AC | PRN

## 2024-06-06 MED ORDER — MORPHINE SULFATE (PF) 4 MG/ML IV SOLN
4.0000 mg | Freq: Once | INTRAVENOUS | Status: AC
  Administered 2024-06-06: 4 mg via INTRAMUSCULAR
  Filled 2024-06-06: qty 1

## 2024-06-06 NOTE — Discharge Instructions (Addendum)
 It was a pleasure taking care of you today.  You were evaluated in the emergency department for left lower abdominal pain rating into your back.  Fortunately your workup was overall very reassuring, including your blood counts, kidney function and electrolytes and urinalysis and CT scan.  We did not find any emergent cause for your pain, please follow-up closely with your primary care doctor and keep your appointment with a gastroenterologist tomorrow, I am prescribing Zofran  since you have had some nausea as well.

## 2024-06-06 NOTE — ED Triage Notes (Signed)
 Pt states she is having LLQ abdominal pain radiating to her back, weakness & blood in urine for x 2 days. Denies any other s/s at time of triage.

## 2024-06-06 NOTE — ED Provider Notes (Signed)
 " [place] EMERGENCY DEPARTMENT AT [facility] Provider Note   CSN: [id number] Arrival date & time: 06/06/24  1528     Patient presents with: Abdominal Pain   Jessica Sanchez is a 39 y.o. female.  She has history of SVT, morbid obesity, hysterectomy.  Presents to the ER today for evaluation of left lower quadrant pain for the past couple of days, radiates into her mid back, it is worse with movement she is having some mild nausea and she had some blood in her urine this morning.  She has not had any dysuria or frequency, no history of kidney stones, denies diarrhea or constipation.  No numbness tingling or weakness.    Abdominal Pain      Prior to Admission medications  Medication Sig Start Date End Date Taking? Authorizing Provider  ACCU-CHEK GUIDE TEST test strip USE AS DIRECTED TO TEST BLOOD SUGAR THREE TIMES DAILY 04/12/24   Bacchus, Gloria Z, FNP  Accu-Chek Softclix Lancets lancets 1 each by Does not apply route in the morning, at noon, and at bedtime. May substitute to any manufacturer covered by patient's insurance. 04/12/24   Bacchus, Meade PEDLAR, FNP  albuterol  (VENTOLIN  HFA) 108 (90 Base) MCG/ACT inhaler Inhale 2 puffs into the lungs every 6 (six) hours as needed for shortness of breath or wheezing. 01/16/24   Tobie Arleta SQUIBB, MD  amlodipine -olmesartan  (AZOR ) 10-20 MG tablet TAKE ONE TABLET BY MOUTH EVERY DAY 01/23/24   Bacchus, Gloria Z, FNP  Blood Glucose Monitoring Suppl DEVI 1 each by Does not apply route in the morning, at noon, and at bedtime. May substitute to any manufacturer covered by patient's insurance. 01/11/23   Bacchus, Meade PEDLAR, FNP  cyclobenzaprine  (FLEXERIL ) 10 MG tablet Take 1 tablet (10 mg total) by mouth 3 (three) times daily as needed for muscle spasms. 03/16/24   Bacchus, Gloria Z, FNP  diazepam  (VALIUM ) 5 MG tablet Take 1 tablet (5 mg total) by mouth once as needed for up to 1 dose for anxiety (take 15-30 minutes before procedure). 10/17/23   Bevely Doffing, FNP  dicyclomine  (BENTYL ) 20 MG tablet Take 1 tablet (20 mg total) by mouth 2 (two) times daily. 04/03/24   Daralene Lonni BIRCH, PA-C  EPINEPHrine  (EPIPEN  2-PAK) 0.3 mg/0.3 mL IJ SOAJ injection Inject 0.3 mg into the muscle as needed for anaphylaxis. 01/16/24   Tobie Arleta SQUIBB, MD  famotidine  (PEPCID ) 40 MG tablet Take 1 tablet (40 mg total) by mouth 2 (two) times daily. 05/02/24   Tobie Arleta SQUIBB, MD  fluticasone  (FLONASE ) 50 MCG/ACT nasal spray Place 2 sprays into both nostrils daily. 05/02/24   Tobie Arleta SQUIBB, MD  Fluticasone -Umeclidin-Vilant (TRELEGY ELLIPTA ) 200-62.5-25 MCG/ACT AEPB Inhale 1 puff into the lungs daily. 05/02/24   Tobie Arleta SQUIBB, MD  gabapentin  (NEURONTIN ) 300 MG capsule Take 1 capsule (300 mg total) by mouth at bedtime. 06/13/23   Bacchus, Gloria Z, FNP  ketorolac  (TORADOL ) 10 MG tablet Take 1 tablet (10 mg total) by mouth every 6 (six) hours as needed. 12/20/23   Boswell, Chelsa, NP  levocetirizine (XYZAL ) 5 MG tablet Take 1-2 tablets by mouth two times daily as needed for hive severity. 05/02/24   Tobie Arleta SQUIBB, MD  linaclotide  (LINZESS ) 290 MCG CAPS capsule Take 1 capsule (290 mcg total) by mouth daily before breakfast. 01/26/24   Carlan, Chelsea L, NP  metFORMIN  (GLUCOPHAGE ) 1000 MG tablet TAKE ONE-HALF TABLET BY MOUTH TWICE DAILY WITH A MEAL Patient taking differently: Take 500 mg  by mouth 2 (two) times daily with a meal. 11/07/23   Bacchus, Meade PEDLAR, FNP  methylPREDNISolone  (MEDROL  DOSEPAK) 4 MG TBPK tablet Take as directed on packaging 12/19/23   Yolande Lamar BROCKS, MD  montelukast  (SINGULAIR ) 10 MG tablet Take 1 tablet (10 mg total) by mouth at bedtime. 05/02/24   Tobie Arleta SQUIBB, MD  Multiple Vitamins-Calcium (ONE-A-DAY WOMENS PO) Take 1 tablet by mouth daily.    [provider]  NON FORMULARY Pt uses a cpap nightly    [provider]  ondansetron  (ZOFRAN -ODT) 4 MG disintegrating tablet Take 1 tablet (4 mg total) by mouth every 8 (eight) hours as needed  for nausea or vomiting. 04/03/24   Daralene Lonni BIRCH, PA-C  oxyCODONE -acetaminophen  (PERCOCET/ROXICET) 5-325 MG tablet Take 1 tablet by mouth every 6 (six) hours as needed. 01/28/24   Triplett, Tammy, PA-C  pantoprazole  (PROTONIX ) 40 MG tablet Take 1 tablet (40 mg total) by mouth 2 (two) times daily. 01/26/24   Carlan, Chelsea L, NP  predniSONE  (DELTASONE ) 20 MG tablet 2 tabs po daily x 3 days Patient not taking: Reported on 03/16/2024 02/20/24   Suzette Pac, MD  Remibrutinib  (RHAPSIDO ) 25 MG TABS Take 25 mg by mouth 2 (two) times daily. 05/07/24   Tobie Arleta SQUIBB, MD  Rimegepant Sulfate (NURTEC) 75 MG TBDP Take 1 tablet (75 mg total) by mouth as needed. 03/16/24   Bacchus, Gloria Z, FNP  sucralfate  (CARAFATE ) 1 g tablet Take 1 tablet (1 g total) by mouth 4 (four) times daily -  with meals and at bedtime. Patient not taking: Reported on 05/02/2024 10/24/23   Garrick Lamar, MD  tamsulosin  (FLOMAX ) 0.4 MG CAPS capsule Take 1 capsule (0.4 mg total) by mouth daily after supper. Patient not taking: Reported on 05/02/2024 11/14/23   Leath-Warren, Etta PARAS, NP  tirzepatide  5 MG/0.5ML injection vial Inject 5 mg into the skin once a week. Patient not taking: Reported on 05/02/2024 03/16/24   Bacchus, Gloria Z, FNP  triamcinolone  ointment (KENALOG ) 0.1 % Apply twice daily for flare ups below neck, maximum 10 days. Patient taking differently: as needed. Apply twice daily for flare ups below neck, maximum 10 days. 04/11/23   Tobie Arleta SQUIBB, MD  Vitamin D , Ergocalciferol , (DRISDOL ) 1.25 MG (50000 UNIT) CAPS capsule Take 1 capsule (50,000 Units total) by mouth every 7 (seven) days. 03/16/24   Bacchus, Gloria Z, FNP  apixaban  (ELIQUIS ) 5 MG TABS tablet Take 2 tablets (10mg ) twice daily for 7 days, then 1 tablet (5mg ) twice daily 04/20/19 04/20/19  Fawze, Mina A, PA-C  levonorgestrel  (MIRENA ) 20 MCG/24HR IUD 1 each by Intrauterine route once.   06/25/20  [provider]    Allergies: Shellfish  allergy , Sulfa  antibiotics, and Adhesive [tape]    Review of Systems  Gastrointestinal:  Positive for abdominal pain.    Updated Vital Signs BP (!) 155/86   Pulse 72   Temp 98.3 F (36.8 C) (Oral)   Resp 18   Ht 5' 1 (1.549 m)   Wt (!) 158.8 kg   LMP 12/26/2022   SpO2 100%   BMI 66.13 kg/m   Physical Exam Vitals and nursing note reviewed.  Constitutional:      General: She is not in acute distress.    Appearance: She is well-developed. She is obese.  HENT:     Head: Normocephalic and atraumatic.  Eyes:     Extraocular Movements: Extraocular movements intact.     Conjunctiva/sclera: Conjunctivae normal.  Cardiovascular:  Rate and Rhythm: Normal rate and regular rhythm.     Heart sounds: No murmur heard. Pulmonary:     Effort: Pulmonary effort is normal. No respiratory distress.     Breath sounds: Normal breath sounds.  Abdominal:     Palpations: Abdomen is soft.     Tenderness: There is abdominal tenderness in the left lower quadrant. There is no right CVA tenderness, left CVA tenderness, guarding or rebound.  Musculoskeletal:        General: No swelling.     Cervical back: Neck supple.  Skin:    General: Skin is warm and dry.     Capillary Refill: Capillary refill takes less than 2 seconds.  Neurological:     General: No focal deficit present.     Mental Status: She is alert.  Psychiatric:        Mood and Affect: Mood normal.     (all labs ordered are listed, but only abnormal results are displayed) Labs Reviewed  URINALYSIS, ROUTINE W REFLEX MICROSCOPIC - Abnormal; Notable for the following components:      Result Value   APPearance HAZY (*)    Hgb urine dipstick MODERATE (*)    All other components within normal limits  CBC - Abnormal; Notable for the following components:   Hemoglobin 10.9 (*)    HCT 35.2 (*)    All other components within normal limits  BASIC METABOLIC PANEL WITH GFR    EKG: None  Radiology: CT Renal Stone Study Result  Date: 06/06/2024 EXAM: CT ABDOMEN AND PELVIS WITHOUT CONTRAST 06/06/2024 05:39:54 PM TECHNIQUE: CT of the abdomen and pelvis was performed without the administration of intravenous contrast. Multiplanar reformatted images are provided for review. Automated exposure control, iterative reconstruction, and/or weight-based adjustment of the mA/kV was utilized to reduce the radiation dose to as low as reasonably achievable. COMPARISON: 05/15/2024, 04/03/2024. CLINICAL HISTORY: Abdominal/flank pain, stone suspected. FINDINGS: LOWER CHEST: Subsegmental atelectasis in the right lower lobe. LIVER: Unchanged small hypodensity in the posterior right hepatic dome, likely small cyst. GALLBLADDER AND BILE DUCTS: Gallbladder is unremarkable. No biliary ductal dilatation. SPLEEN: No acute abnormality. PANCREAS: No acute abnormality. ADRENAL GLANDS: No acute abnormality. KIDNEYS, URETERS AND BLADDER: No stones in the kidneys or ureters. No hydronephrosis. No perinephric or periureteral stranding. The urinary bladder is distended without focal abnormality. GI AND BOWEL: Stomach demonstrates no acute abnormality. There is no bowel obstruction. Normal decompressed appendix. PERITONEUM AND RETROPERITONEUM: No ascites. No free air. VASCULATURE: Aorta is normal in caliber. LYMPH NODES: No lymphadenopathy. REPRODUCTIVE ORGANS: Hysterectomy. The ovaries are otherwise within normal limits for patients age. BONES AND SOFT TISSUES: Multilevel thoracic osteophytosis. Similar appearance of the bilobed, fat containing paraumbilical/umbilical ventral hernia. No findings of vascular compromise. IMPRESSION: 1. No acute intraabdominal or pelvic abnormality. Electronically signed by: Rogelia Myers MD 06/06/2024 06:01 PM EST RP Workstation: [id number]     Procedures   Medications Ordered in the ED  morphine  (PF) 4 MG/ML injection 4 mg (4 mg Intramuscular Given 06/06/24 1813)                                    Medical Decision  Making Differential diagnosis includes but not limited to diverticulitis, colitis, muscle strain, UTI, kidney stone, other  ED course: Patient presents to ER for evaluation of left lower quadrant pain radiating to the left flank with appearance of blood in her urine this morning x 2.  She denies urinary symptoms otherwise.  On exam she appears mildly uncomfortable she does have some tenderness to left lower quadrant with no exquisite tenderness on palpation of the back, she has no numbness tingling or weakness, no gait difficulty.  She was given morphine  for pain.  On my reevaluation her symptoms have improved.  Labs showed no leukocytosis, hemoglobin is stable at 10.9, urine has no nitrate, leukocytes, red blood cells or white blood cells or bacteria.  Given the radiation to her back and hematuria this morning CT ordered to rule out kidney stone or other intra-abdominal process. CT of the abdomen and was reviewed and independently interpreted by me, there is no obstructive uropathy, no signs of bowel obstruction, no other acute findings, I agree with radiology reading.  Discussed with patient that her workup was reassuring, we did not find an emergent cause of her pain today and encouraged close outpatient follow-up.  She states she actually has an appointment with GI tomorrow.   Amount and/or Complexity of Data Reviewed Labs: ordered. Radiology: ordered.  Risk Prescription drug management.        Final diagnoses:  None    ED Discharge Orders     None          Suellen Sherran DELENA DEVONNA 06/06/24 1905  "

## 2024-06-07 ENCOUNTER — Ambulatory Visit (INDEPENDENT_AMBULATORY_CARE_PROVIDER_SITE_OTHER): Admitting: Gastroenterology

## 2024-06-07 ENCOUNTER — Telehealth (INDEPENDENT_AMBULATORY_CARE_PROVIDER_SITE_OTHER): Payer: Self-pay

## 2024-06-07 ENCOUNTER — Encounter (INDEPENDENT_AMBULATORY_CARE_PROVIDER_SITE_OTHER): Payer: Self-pay | Admitting: Gastroenterology

## 2024-06-07 VITALS — BP 133/73 | HR 92 | Temp 98.4°F | Ht 61.0 in | Wt 364.6 lb

## 2024-06-07 DIAGNOSIS — K5900 Constipation, unspecified: Secondary | ICD-10-CM

## 2024-06-07 MED ORDER — DICYCLOMINE HCL 10 MG PO CAPS: 10.0000 mg | ORAL_CAPSULE | Freq: Three times a day (TID) | ORAL | 1 refills | Status: AC | PRN

## 2024-06-07 MED ORDER — PEG 3350-KCL-NA BICARB-NACL 420 G PO SOLR: 4000.0000 mL | Freq: Once | ORAL | 0 refills | Status: AC

## 2024-06-07 MED ORDER — LUBIPROSTONE 24 MCG PO CAPS: 24.0000 ug | ORAL_CAPSULE | Freq: Two times a day (BID) | ORAL | 3 refills | Status: AC

## 2024-06-07 NOTE — Progress Notes (Unsigned)
 "  Referring Provider: Edman Meade PEDLAR, FNP Primary Care Physician:  Edman Meade PEDLAR, FNP Primary GI Physician: Dr. Cindie  Chief Complaint  Patient presents with   Follow-up    Patient here today for a follow up on Constipation. Patient says she is still having issues with this, stomach cramping and bloating also. She says she went to the Ed yesterday due to the abdominal pain and blood in urine. She states she Linzess  290 mcg daily. She states some days she has a bm and others she does not.    HPI:   Jessica Sanchez is a 39 y.o. female with past medical history of anxiety, arthritis, asthma, DM, uterine fibroids, HTN, sleep apnea, SVT   Patient presenting today for:  Follow up of constipation  Last seen September, at that time constipation not improved, having 1 BM per week on linzess  145 mcg daily, having to strain, lower abdominal pain. GERD improved on protonix  40mg  daily. Epigastric pain and melena   Recommendations: -increase protonix  40mg  to BID  -continue famotidine  daily -linzess  increase to 290mcg daily, consider alternative agent if not improving (amitiza ) -Increase water  intake, aim for atleast 64 oz per day -Increase fruits, veggies and whole grains, kiwi and prunes are especially good for constipation -good reflux precautions  -schedule EGD ASA III   EGD-03/2024 Small hiatal hernia.                           - Gastritis. Biopsied.                           - Normal duodenal bulb, first portion of the                            duodenum and second portion of the duodenum. A. GASTRIC, BIOPSY:       Gastric antral / oxyntic mucosa without significant diagnostic  alteration.      No H. pylori identified on HE stain.       Negative for intestinal metaplasia or dysplasia.   CT abdomen pelvis with contrast 05/15/24: no acute abnormality  Present:  She continues to have bloating and constipation. She went to the ED for lower abdominal pain and hematuria but had renal  stone study that was negative, UA showed moderate hgb. She is taking linzess  , daily, though she is generally taking this at night due to her work schedule. She notes some days she does not have a BM even on linzess , other days can have 4-5 BMs per day, she may feel she empties out well on initial BM but then subsequent stools are watery. She notes some BRB on occasion mixed in with her stools. she drinks a lot of water . She endorses generally having lower abdominal pain, she is unsure if this improves with defecation.     Last Colonoscopy: never   Filed Weights   06/07/24 1443  Weight: (!) 364 lb 9.6 oz (165.4 kg)     Past Medical History:  Diagnosis Date   Angio-edema    Anxiety    Arthritis    right shoulder   Asthma    Diabetes mellitus without complication (HCC)    Dyspnea    Dysrhythmia    hx of SVT   Fibroid (bleeding) (uterine)    Fibroid, uterine    H/O metrorrhagia    Hypertension  IUD (intrauterine device) in place 07/11/2015   Menorrhagia    Obesity, Class III, BMI 40-49.9 (morbid obesity) (HCC) 11/11/2018   Sleep apnea    uses CPAP - setting 11   SVT (supraventricular tachycardia)     Past Surgical History:  Procedure Laterality Date   DILATATION AND CURETTAGE/HYSTEROSCOPY WITH MINERVA N/A 12/01/2021   Procedure: DILATATION AND CURETTAGE/HYSTEROSCOPY WITH MINERVA;  Surgeon: Jayne Vonn DEL, MD;  Location: [facility];  Service: Gynecology;  Laterality: N/A;   ESOPHAGOGASTRODUODENOSCOPY N/A 03/05/2024   Procedure: EGD (ESOPHAGOGASTRODUODENOSCOPY);  Surgeon: Cindie Carlin POUR, DO;  Location: [facility];  Service: Endoscopy;  Laterality: N/A;  10:00am, ASA 3   HYSTERECTOMY ABDOMINAL WITH SALPINGECTOMY Bilateral 01/19/2023   Procedure: HYSTERECTOMY ABDOMINAL WITH SALPINGECTOMY;  Surgeon: Jayne Vonn DEL, MD;  Location: [facility];  Service: Gynecology;  Laterality: Bilateral;   HYSTEROSCOPY N/A 09/17/2020   Procedure: HYSTEROSCOPY;  Surgeon: Jayne Vonn DEL, MD;   Location: [facility];  Service: Gynecology;  Laterality: N/A;   HYSTEROSCOPY WITH D & C N/A 09/27/2013   Procedure: DILATATION AND CURETTAGE /HYSTEROSCOPY and insertion of Mirena  IUD ;  Surgeon: Marjorie DEL. Okey, MD;  Location: [facility];  Service: Gynecology;  Laterality: N/A;   IUD REMOVAL N/A 09/17/2020   Procedure: INTRAUTERINE DEVICE (IUD) REMOVAL (2);  Surgeon: Jayne Vonn DEL, MD;  Location: [facility];  Service: Gynecology;  Laterality: N/A;   LAPAROSCOPY N/A 01/19/2023   Procedure: LAPAROSCOPY DIAGNOSTIC;  Surgeon: Jayne Vonn DEL, MD;  Location: [facility];  Service: Gynecology;  Laterality: N/A;   MYOMECTOMY  02/03/2011   Procedure: MYOMECTOMY;  Surgeon: Vonn DEL Jayne, MD;  Location: [facility];  Service: Gynecology;  Laterality: N/A;   SVT ABLATION N/A 11/08/2018   Procedure: SVT ABLATION;  Surgeon: Waddell Danelle ORN, MD;  Location: [facility] CV [facility];  Service: Cardiovascular;  Laterality: N/A;   SVT ABLATION N/A 12/26/2020   Procedure: SVT ABLATION;  Surgeon: Waddell Danelle ORN, MD;  Location: [facility] INVASIVE CV LAB;  Service: Cardiovascular;  Laterality: N/A;   TONSILLECTOMY     TYMPANOSTOMY TUBE PLACEMENT      Current Outpatient Medications  Medication Sig Dispense Refill   ACCU-CHEK GUIDE TEST test strip USE AS DIRECTED TO TEST BLOOD SUGAR THREE TIMES DAILY 100 strip 0   Accu-Chek Softclix Lancets lancets 1 each by Does not apply route in the morning, at noon, and at bedtime. May substitute to any manufacturer covered by patient's insurance. 100 each 0   albuterol  (VENTOLIN  HFA) 108 (90 Base) MCG/ACT inhaler Inhale 2 puffs into the lungs every 6 (six) hours as needed for shortness of breath or wheezing. 8 g 1   amlodipine -olmesartan  (AZOR ) 10-20 MG tablet TAKE ONE TABLET BY MOUTH EVERY DAY 30 tablet 1   Blood Glucose Monitoring Suppl DEVI 1 each by Does not apply route in the morning, at noon, and at bedtime. May substitute to any manufacturer covered by patient's insurance. 1 each 0   cyclobenzaprine   (FLEXERIL ) 10 MG tablet Take 1 tablet (10 mg total) by mouth 3 (three) times daily as needed for muscle spasms. 30 tablet 0   EPINEPHrine  (EPIPEN  2-PAK) 0.3 mg/0.3 mL IJ SOAJ injection Inject 0.3 mg into the muscle as needed for anaphylaxis. 2 each 1   famotidine  (PEPCID ) 40 MG tablet Take 1 tablet (40 mg total) by mouth 2 (two) times daily. 60 tablet 5   fluticasone  (FLONASE ) 50 MCG/ACT nasal spray Place 2 sprays into both nostrils daily. 16 g 5   Fluticasone -Umeclidin-Vilant (  TRELEGY ELLIPTA ) 200-62.5-25 MCG/ACT AEPB Inhale 1 puff into the lungs daily. 28 each 5   gabapentin  (NEURONTIN ) 300 MG capsule Take 1 capsule (300 mg total) by mouth at bedtime. 90 capsule 3   levocetirizine (XYZAL ) 5 MG tablet Take 1-2 tablets by mouth two times daily as needed for hive severity. 120 tablet 5   linaclotide  (LINZESS ) 290 MCG CAPS capsule Take 1 capsule (290 mcg total) by mouth daily before breakfast.     metFORMIN  (GLUCOPHAGE ) 1000 MG tablet TAKE ONE-HALF TABLET BY MOUTH TWICE DAILY WITH A MEAL (Patient taking differently: Take 500 mg by mouth 2 (two) times daily with a meal.) 30 tablet 7   Multiple Vitamins-Calcium (ONE-A-DAY WOMENS PO) Take 1 tablet by mouth daily.     NON FORMULARY Pt uses a cpap nightly     ondansetron  (ZOFRAN -ODT) 4 MG disintegrating tablet Take 1 tablet (4 mg total) by mouth every 8 (eight) hours as needed for nausea or vomiting. 20 tablet 0   pantoprazole  (PROTONIX ) 40 MG tablet Take 1 tablet (40 mg total) by mouth 2 (two) times daily. 60 tablet 3   Rimegepant Sulfate (NURTEC) 75 MG TBDP Take 1 tablet (75 mg total) by mouth as needed. 4 tablet 0   tirzepatide  5 MG/0.5ML injection vial Inject 5 mg into the skin once a week. 2 mL 0   triamcinolone  ointment (KENALOG ) 0.1 % Apply twice daily for flare ups below neck, maximum 10 days. (Patient taking differently: as needed. Apply twice daily for flare ups below neck, maximum 10 days.) 80 g 3   Vitamin D , Ergocalciferol , (DRISDOL ) 1.25 MG  (50000 UNIT) CAPS capsule Take 1 capsule (50,000 Units total) by mouth every 7 (seven) days. 20 capsule 1   diazepam  (VALIUM ) 5 MG tablet Take 1 tablet (5 mg total) by mouth once as needed for up to 1 dose for anxiety (take 15-30 minutes before procedure). (Patient not taking: Reported on 06/07/2024) 2 tablet 0   dicyclomine  (BENTYL ) 20 MG tablet Take 1 tablet (20 mg total) by mouth 2 (two) times daily. (Patient not taking: Reported on 06/07/2024) 20 tablet 0   ketorolac  (TORADOL ) 10 MG tablet Take 1 tablet (10 mg total) by mouth every 6 (six) hours as needed. (Patient not taking: Reported on 06/07/2024) 20 tablet 0   methylPREDNISolone  (MEDROL  DOSEPAK) 4 MG TBPK tablet Take as directed on packaging (Patient not taking: Reported on 06/07/2024) 21 each 0   montelukast  (SINGULAIR ) 10 MG tablet Take 1 tablet (10 mg total) by mouth at bedtime. (Patient not taking: Reported on 06/07/2024) 30 tablet 5   oxyCODONE -acetaminophen  (PERCOCET/ROXICET) 5-325 MG tablet Take 1 tablet by mouth every 6 (six) hours as needed. (Patient not taking: Reported on 06/07/2024) 6 tablet 0   predniSONE  (DELTASONE ) 20 MG tablet 2 tabs po daily x 3 days (Patient not taking: Reported on 06/07/2024) 6 tablet 0   Remibrutinib  (RHAPSIDO ) 25 MG TABS Take 25 mg by mouth 2 (two) times daily. 60 tablet 11   sucralfate  (CARAFATE ) 1 g tablet Take 1 tablet (1 g total) by mouth 4 (four) times daily -  with meals and at bedtime. (Patient not taking: Reported on 06/07/2024) 28 tablet 0   tamsulosin  (FLOMAX ) 0.4 MG CAPS capsule Take 1 capsule (0.4 mg total) by mouth daily after supper. (Patient not taking: Reported on 06/07/2024) 30 capsule 0   No current facility-administered medications for this visit.    Allergies as of 06/07/2024 - Review Complete 06/07/2024  Allergen Reaction Noted  Shellfish allergy  Shortness Of Breath, Itching, and Swelling 06/14/2016   Sulfa  antibiotics Shortness Of Breath, Itching, and Swelling 06/14/2016   Adhesive [tape] Itching  06/09/2017    Social History   Socioeconomic History   Marital status: Single    Spouse name: Not on file   Number of children: 0   Years of education: Not on file   Highest education level: 12th grade  Occupational History   Not on file  Tobacco Use   Smoking status: Never   Smokeless tobacco: Never   Tobacco comments:    socially  Vaping Use   Vaping status: Never Used  Substance and Sexual Activity   Alcohol use: Yes    Comment: wine occasionally   Drug use: Yes    Types: Marijuana    Comment: occassional   Sexual activity: Not Currently    Birth control/protection: Surgical    Comment: hyst  Other Topics Concern   Not on file  Social History Narrative   Not on file   Social Drivers of Health   Tobacco Use: Low Risk (06/07/2024)   Patient History    Smoking Tobacco Use: Never    Smokeless Tobacco Use: Never    Passive Exposure: Not on file  Financial Resource Strain: Low Risk (05/15/2023)   Overall Financial Resource Strain (CARDIA)    Difficulty of Paying Living Expenses: Not very hard  Food Insecurity: No Food Insecurity (05/15/2023)   Hunger Vital Sign    Worried About Running Out of Food in the Last Year: Never true    Ran Out of Food in the Last Year: Never true  Transportation Needs: No Transportation Needs (05/15/2023)   PRAPARE - Administrator, Civil Service (Medical): No    Lack of Transportation (Non-Medical): No  Physical Activity: Sufficiently Active (05/15/2023)   Exercise Vital Sign    Days of Exercise per Week: 5 days    Minutes of Exercise per Session: 30 min  Stress: Stress Concern Present (05/15/2023)   [employer] of Occupational Health - Occupational Stress Questionnaire    Feeling of Stress : Rather much  Social Connections: Moderately Isolated (05/15/2023)   Social Connection and Isolation Panel    Frequency of Communication with Friends and Family: More than three times a week    Frequency of Social Gatherings with  Friends and Family: Once a week    Attends Religious Services: More than 4 times per year    Active Member of Clubs or Organizations: No    Attends Engineer, Structural: Not on file    Marital Status: Never married  Depression (PHQ2-9): Low Risk (03/16/2024)   Depression (PHQ2-9)    PHQ-2 Score: 2  Alcohol Screen: Low Risk (05/15/2023)   Alcohol Screen    Last Alcohol Screening Score (AUDIT): 1  Housing: Low Risk (05/15/2023)   Housing Stability Vital Sign    Unable to Pay for Housing in the Last Year: No    Number of Times Moved in the Last Year: 0    Homeless in the Last Year: No  Utilities: Not At Risk (01/19/2023)   [facility] Utilities    Threatened with loss of utilities: No  Health Literacy: Not on file    Review of systems General: negative for malaise, night sweats, fever, chills, weight los Neck: Negative for lumps, goiter, pain and significant neck swelling Resp: Negative for cough, wheezing, dyspnea at rest CV: Negative for chest pain, leg swelling, palpitations, orthopnea GI: denies melena, hematochezia, nausea,  vomiting, diarrhea, constipation, dysphagia, odyonophagia, early satiety or unintentional weight loss.  MSK: Negative for joint pain or swelling, back pain, and muscle pain. Derm: Negative for itching or rash Psych: Denies depression, anxiety, memory loss, confusion. No homicidal or suicidal ideation.  Heme: Negative for prolonged bleeding, bruising easily, and swollen nodes. Endocrine: Negative for cold or heat intolerance, polyuria, polydipsia and goiter. Neuro: negative for tremor, gait imbalance, syncope and seizures. The remainder of the review of systems is noncontributory.  Physical Exam: BP 133/73 (BP Location: Left Arm, Patient Position: Sitting, Cuff Size: Large)   Pulse 92   Temp 98.4 F (36.9 C) (Temporal)   Ht 5' 1 (1.549 m)   Wt (!) 364 lb 9.6 oz (165.4 kg)   LMP 12/26/2022   BMI 68.89 kg/m  General:   Alert and oriented. No distress  noted. Pleasant and cooperative.  Head:  Normocephalic and atraumatic. Eyes:  Conjuctiva clear without scleral icterus. Mouth:  Oral mucosa pink and moist. Good dentition. No lesions. Heart: Normal rate and rhythm, s1 and s2 heart sounds present.  Lungs: Clear lung sounds in all lobes. Respirations equal and unlabored. Abdomen:  +BS, soft, non-tender and non-distended. No rebound or guarding. No HSM or masses noted. Derm: No palmar erythema or jaundice Msk:  Symmetrical without gross deformities. Normal posture. Extremities:  Without edema. Neurologic:  Alert and  oriented x4 Psych:  Alert and cooperative. Normal mood and affect.  Invalid input(s): 6 MONTHS   ASSESSMENT: Jessica Sanchez is a 39 y.o. female presenting today    PLAN:  -stop linzess  -start amitiza  24mcg BID -schedule Colonoscopy -Increase water  intake, aim for atleast 64 oz per day -Increase fruits, veggies and whole grains, kiwi and prunes are especially good for constipation -discuss hematuria with PCP, need urology referral    All questions were answered, patient verbalized understanding and is in agreement with plan as outlined above.   Follow Up: ***  Vin Yonke L. Terrance Lanahan, MSN, APRN, AGNP-C Adult-Gerontology Nurse Practitioner [facility]  "

## 2024-06-07 NOTE — Telephone Encounter (Signed)
PA is not required.

## 2024-06-07 NOTE — Patient Instructions (Signed)
-  stop linzess  -start amitiza  24mcg twice daily -schedule Colonoscopy -Increase water  intake, aim for atleast 64 oz per day -Increase fruits, veggies and whole grains, kiwi and prunes are especially good for constipation -discuss blood in urine with PCP, need urology referral   Follow up 3 months  It was a pleasure to see you today. I want to create trusting relationships with patients and provide genuine, compassionate, and quality care. I truly value your feedback! please be on the lookout for a survey regarding your visit with me today. I appreciate your input about our visit and your time in completing this!    Arick Mareno L. Shondra Capps, MSN, APRN, AGNP-C Adult-Gerontology Nurse Practitioner [facility] at [facility]

## 2024-06-07 NOTE — Telephone Encounter (Signed)
 Spoke with patient in the office, scheduled TCS for 06/27/2024 at 12:45pm. Rx sent to pharmacy. Instructions given to patient.

## 2024-06-13 ENCOUNTER — Other Ambulatory Visit: Admitting: Psychology

## 2024-06-14 ENCOUNTER — Ambulatory Visit: Payer: Self-pay | Admitting: Family Medicine

## 2024-06-25 ENCOUNTER — Other Ambulatory Visit (HOSPITAL_COMMUNITY)

## 2024-06-27 ENCOUNTER — Encounter (HOSPITAL_COMMUNITY): Payer: Self-pay

## 2024-06-27 ENCOUNTER — Ambulatory Visit (HOSPITAL_COMMUNITY): Admit: 2024-06-27 | Admitting: Internal Medicine

## 2024-08-01 ENCOUNTER — Ambulatory Visit: Admitting: Family Medicine

## 2024-08-14 ENCOUNTER — Encounter: Admitting: Family Medicine

## 2024-08-15 ENCOUNTER — Encounter: Admitting: Nurse Practitioner
